# Patient Record
Sex: Male | Born: 1935 | Race: Black or African American | Hispanic: No | Marital: Married | Smoking: Former smoker
Health system: Southern US, Community
[De-identification: ages and names within clinical notes are randomized; demographics above are authoritative.]

## PROBLEM LIST (undated history)

## (undated) DIAGNOSIS — M1990 Unspecified osteoarthritis, unspecified site: Secondary | ICD-10-CM

## (undated) DIAGNOSIS — I10 Essential (primary) hypertension: Secondary | ICD-10-CM

## (undated) DIAGNOSIS — G459 Transient cerebral ischemic attack, unspecified: Secondary | ICD-10-CM

## (undated) DIAGNOSIS — Z86010 Personal history of colonic polyps: Secondary | ICD-10-CM

## (undated) DIAGNOSIS — E611 Iron deficiency: Secondary | ICD-10-CM

## (undated) DIAGNOSIS — E7800 Pure hypercholesterolemia, unspecified: Secondary | ICD-10-CM

## (undated) DIAGNOSIS — I639 Cerebral infarction, unspecified: Secondary | ICD-10-CM

## (undated) DIAGNOSIS — K859 Acute pancreatitis without necrosis or infection, unspecified: Secondary | ICD-10-CM

## (undated) HISTORY — DX: Cerebral infarction, unspecified: I63.9

## (undated) HISTORY — DX: Acute pancreatitis without necrosis or infection, unspecified: K85.90

## (undated) HISTORY — PX: EUS: SHX5427

## (undated) HISTORY — PX: UPPER GASTROINTESTINAL ENDOSCOPY: SHX188

## (undated) HISTORY — DX: Personal history of colonic polyps: Z86.010

## (undated) HISTORY — PX: CHOLECYSTECTOMY: SHX55

## (undated) HISTORY — DX: Essential (primary) hypertension: I10

## (undated) HISTORY — PX: COLONOSCOPY: SHX174

## (undated) HISTORY — DX: Iron deficiency: E61.1

## (undated) HISTORY — DX: Unspecified osteoarthritis, unspecified site: M19.90

## (undated) HISTORY — DX: Pure hypercholesterolemia, unspecified: E78.00

## (undated) HISTORY — DX: Transient cerebral ischemic attack, unspecified: G45.9

---

## 1983-04-10 HISTORY — PX: NECK SURGERY: SHX720

## 1983-04-10 HISTORY — PX: BACK SURGERY: SHX140

## 1997-08-30 ENCOUNTER — Emergency Department (HOSPITAL_COMMUNITY): Admission: EM | Admit: 1997-08-30 | Discharge: 1997-08-30 | Payer: Self-pay | Admitting: Emergency Medicine

## 2009-08-12 IMAGING — CR DG FOOT COMPLETE 3+V*L*
3 series · 3 of 3 positions shown · non-contrast
Comparison: None.

CLINICAL DATA: Trauma.  Bilateral pain.

LEFT FOOT - COMPLETE 3+ VIEW

[t foot lat left *]
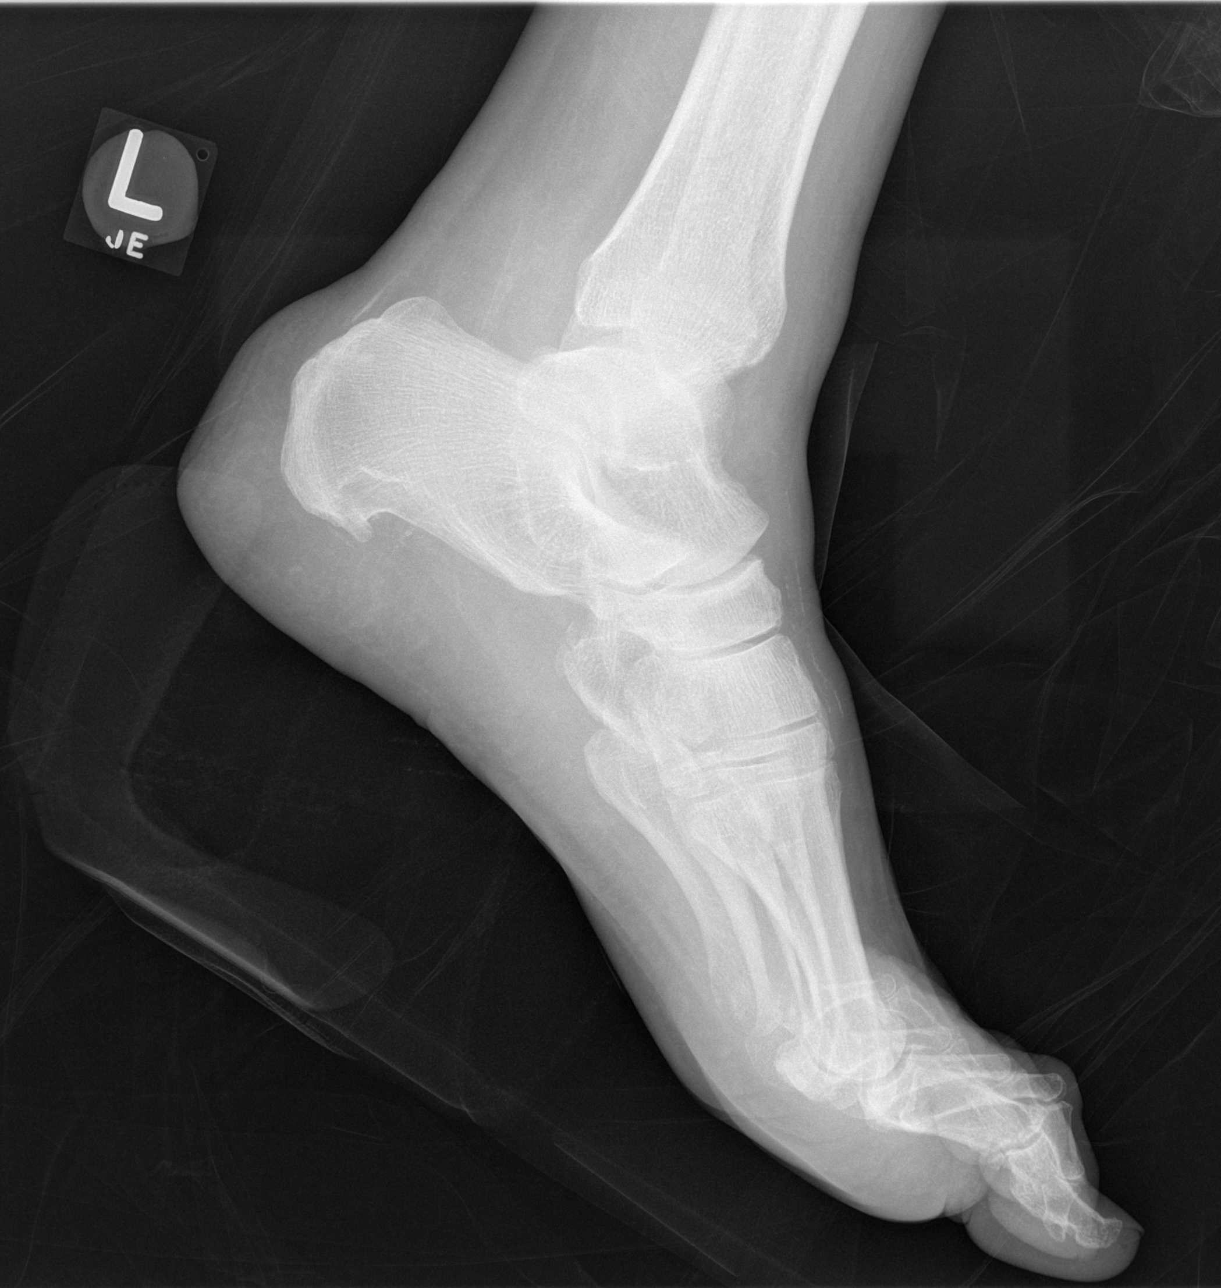

[t foot ap left]
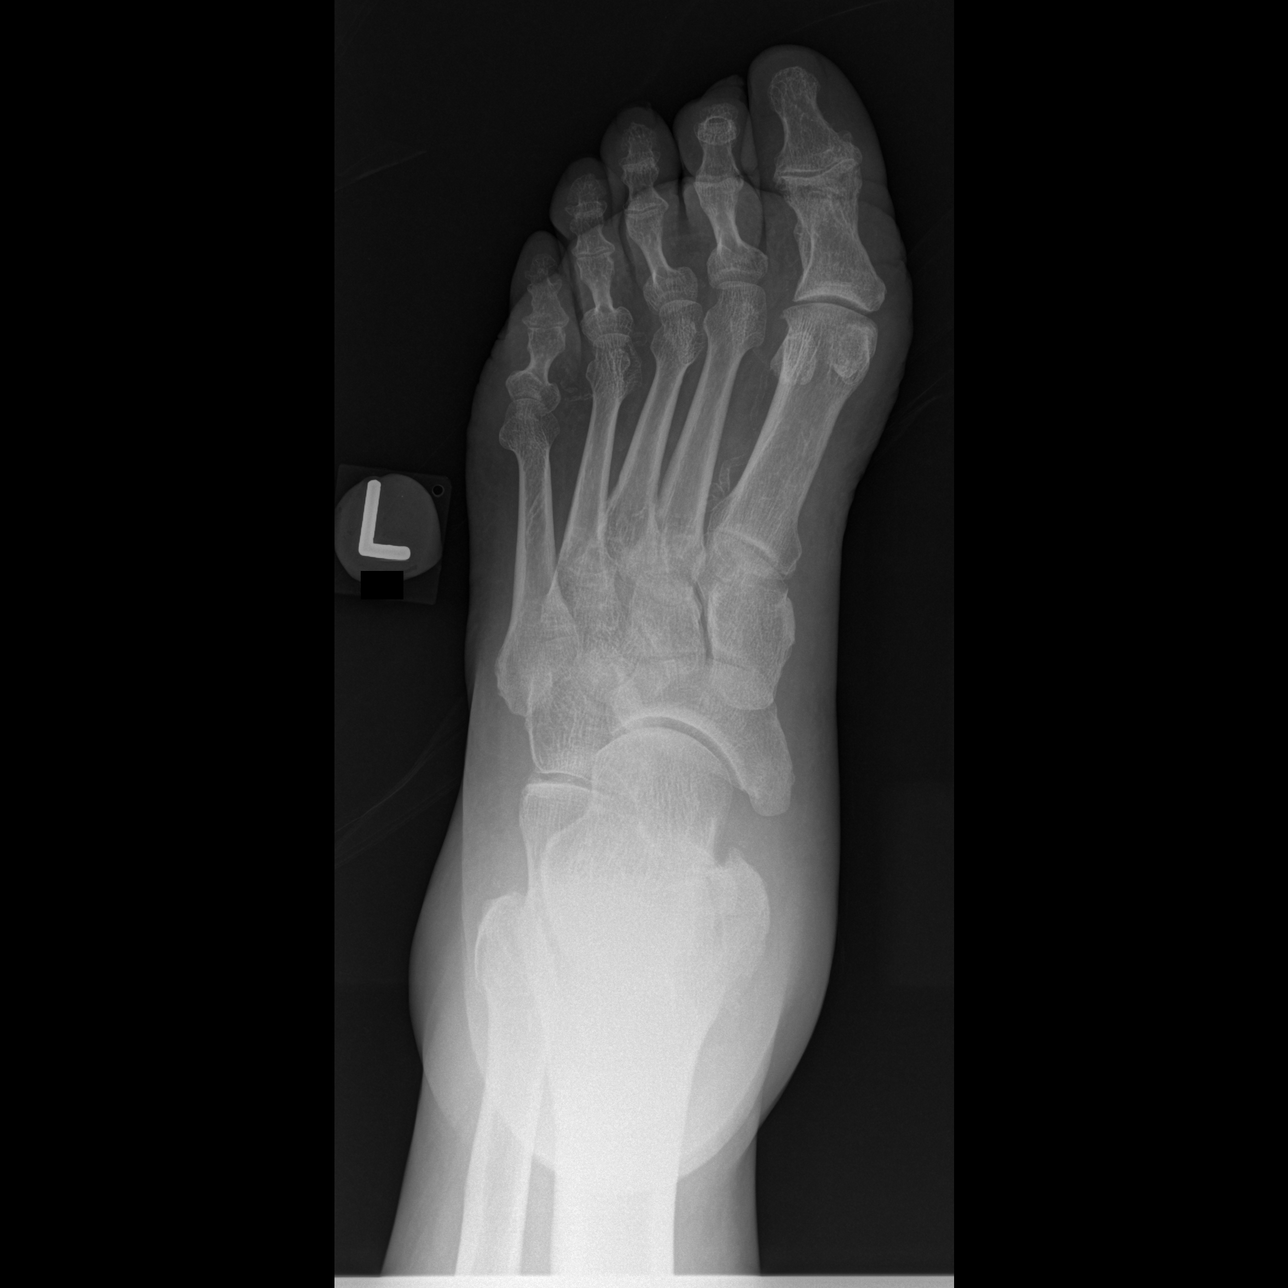

[t foot oblique left]
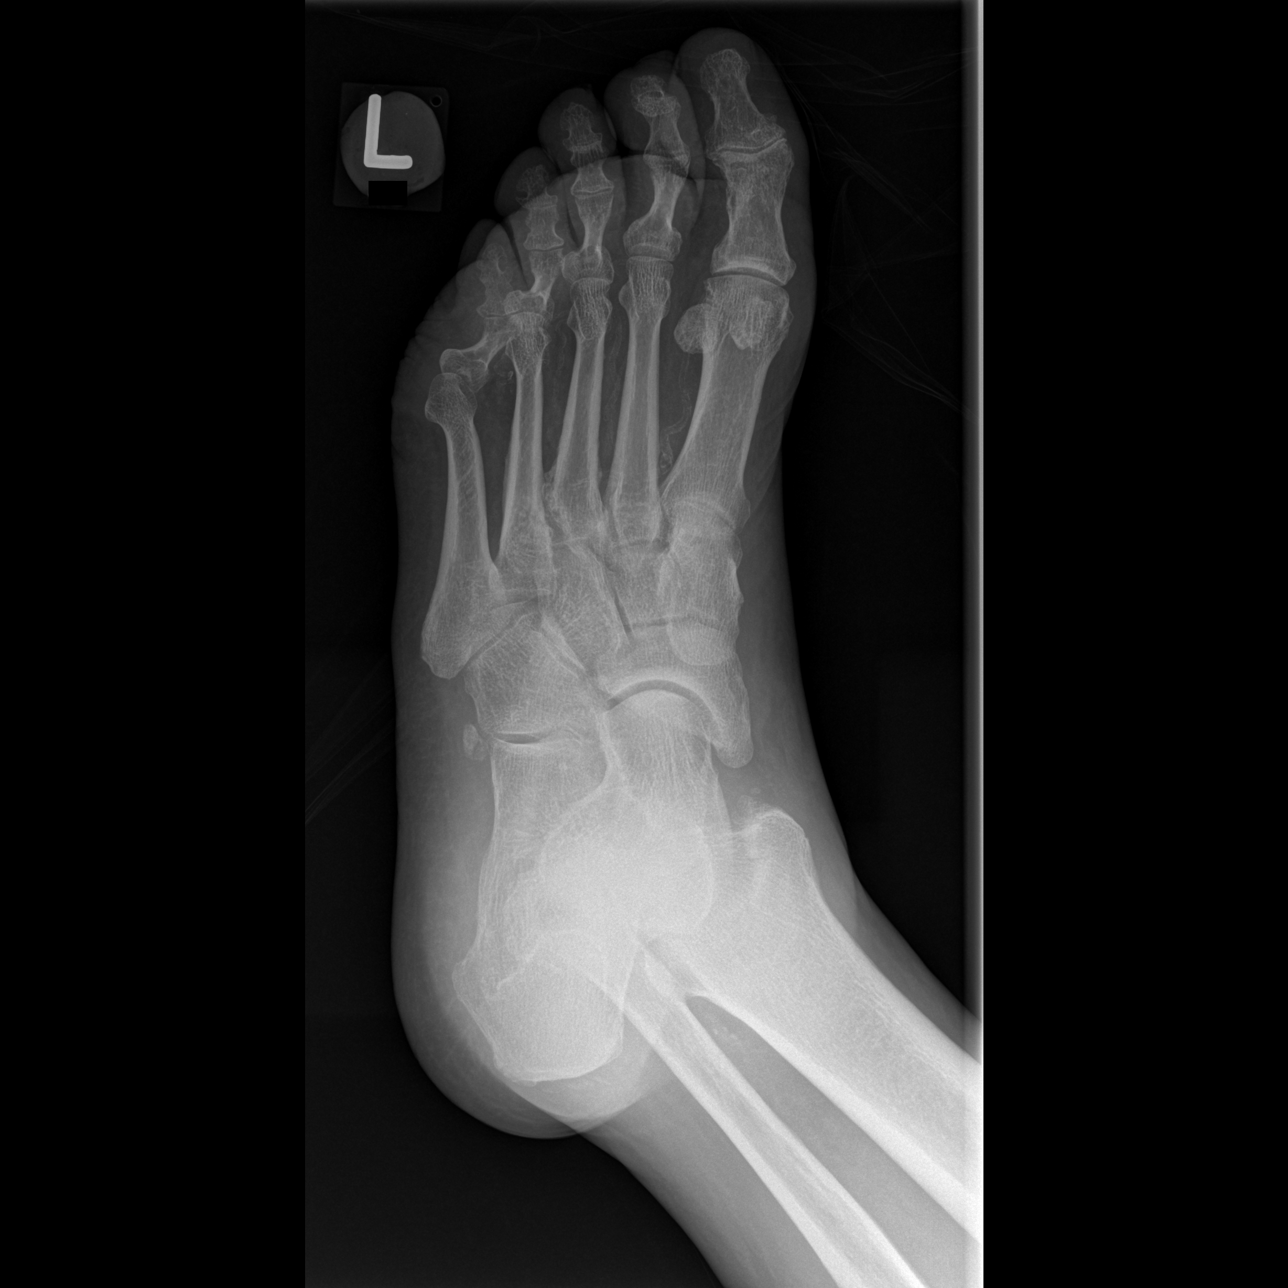

[3 of 3 positions shown; findings below may reference images not displayed]

FINDINGS: The medial malleolar fractures better visualized on
dedicated views of the ankle.  A plantar calcaneal spur is again
noted.  No additional acute bone or soft tissue abnormality is
present.  Small vessel calcifications are noted.
IMPRESSION: 1.  Medial malleolar fracture.
2.  No additional fractures.
3.  Small vessel calcifications compatible with diabetes.

## 2010-04-12 ENCOUNTER — Emergency Department (HOSPITAL_COMMUNITY)
Admission: EM | Admit: 2010-04-12 | Discharge: 2010-04-12 | Payer: Self-pay | Source: Home / Self Care | Admitting: Emergency Medicine

## 2010-04-12 IMAGING — CR DG SHOULDER 2+V*L*
3 series · 3 of 3 positions shown · non-contrast
Comparison: None.

CLINICAL DATA: Trauma

LEFT SHOULDER - 2+ VIEW

[t shoulder ap internal left]
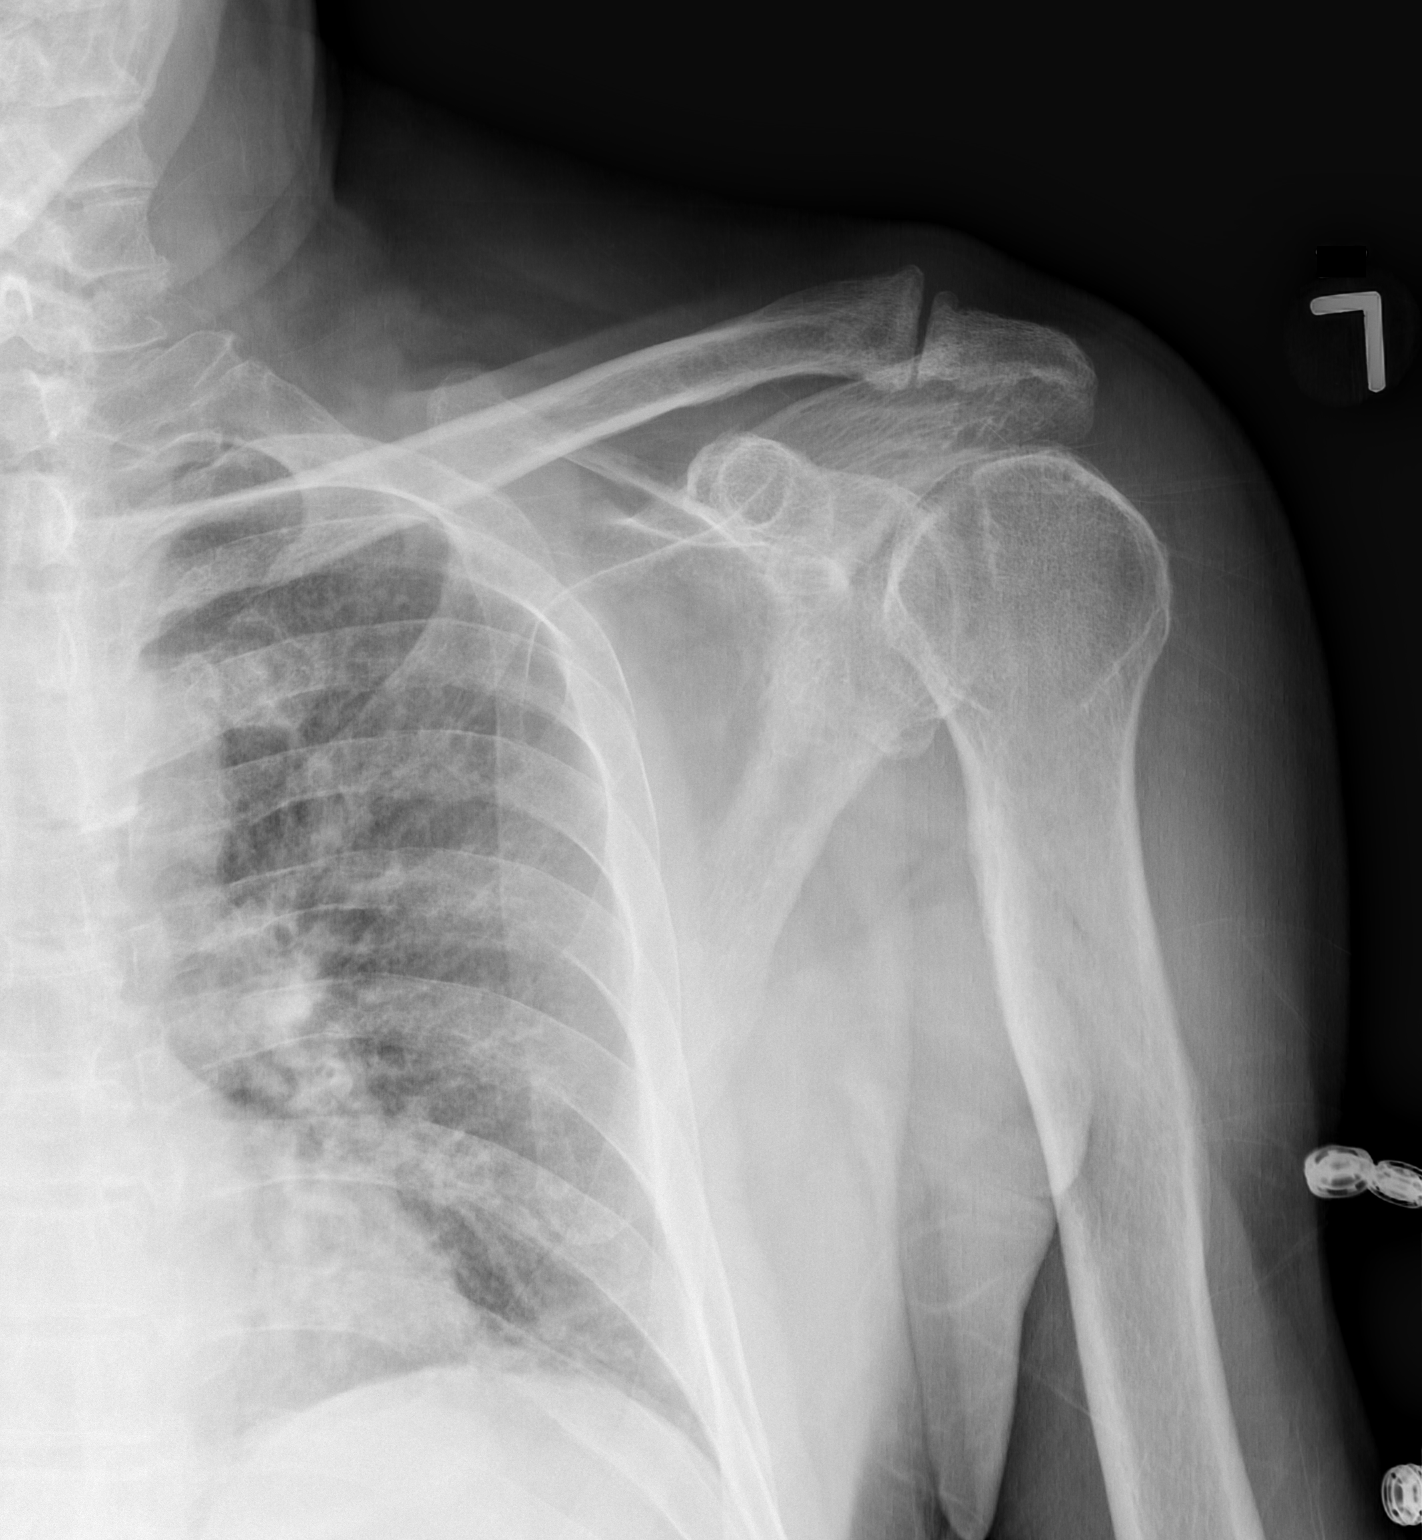

[t shoulder ap external left]
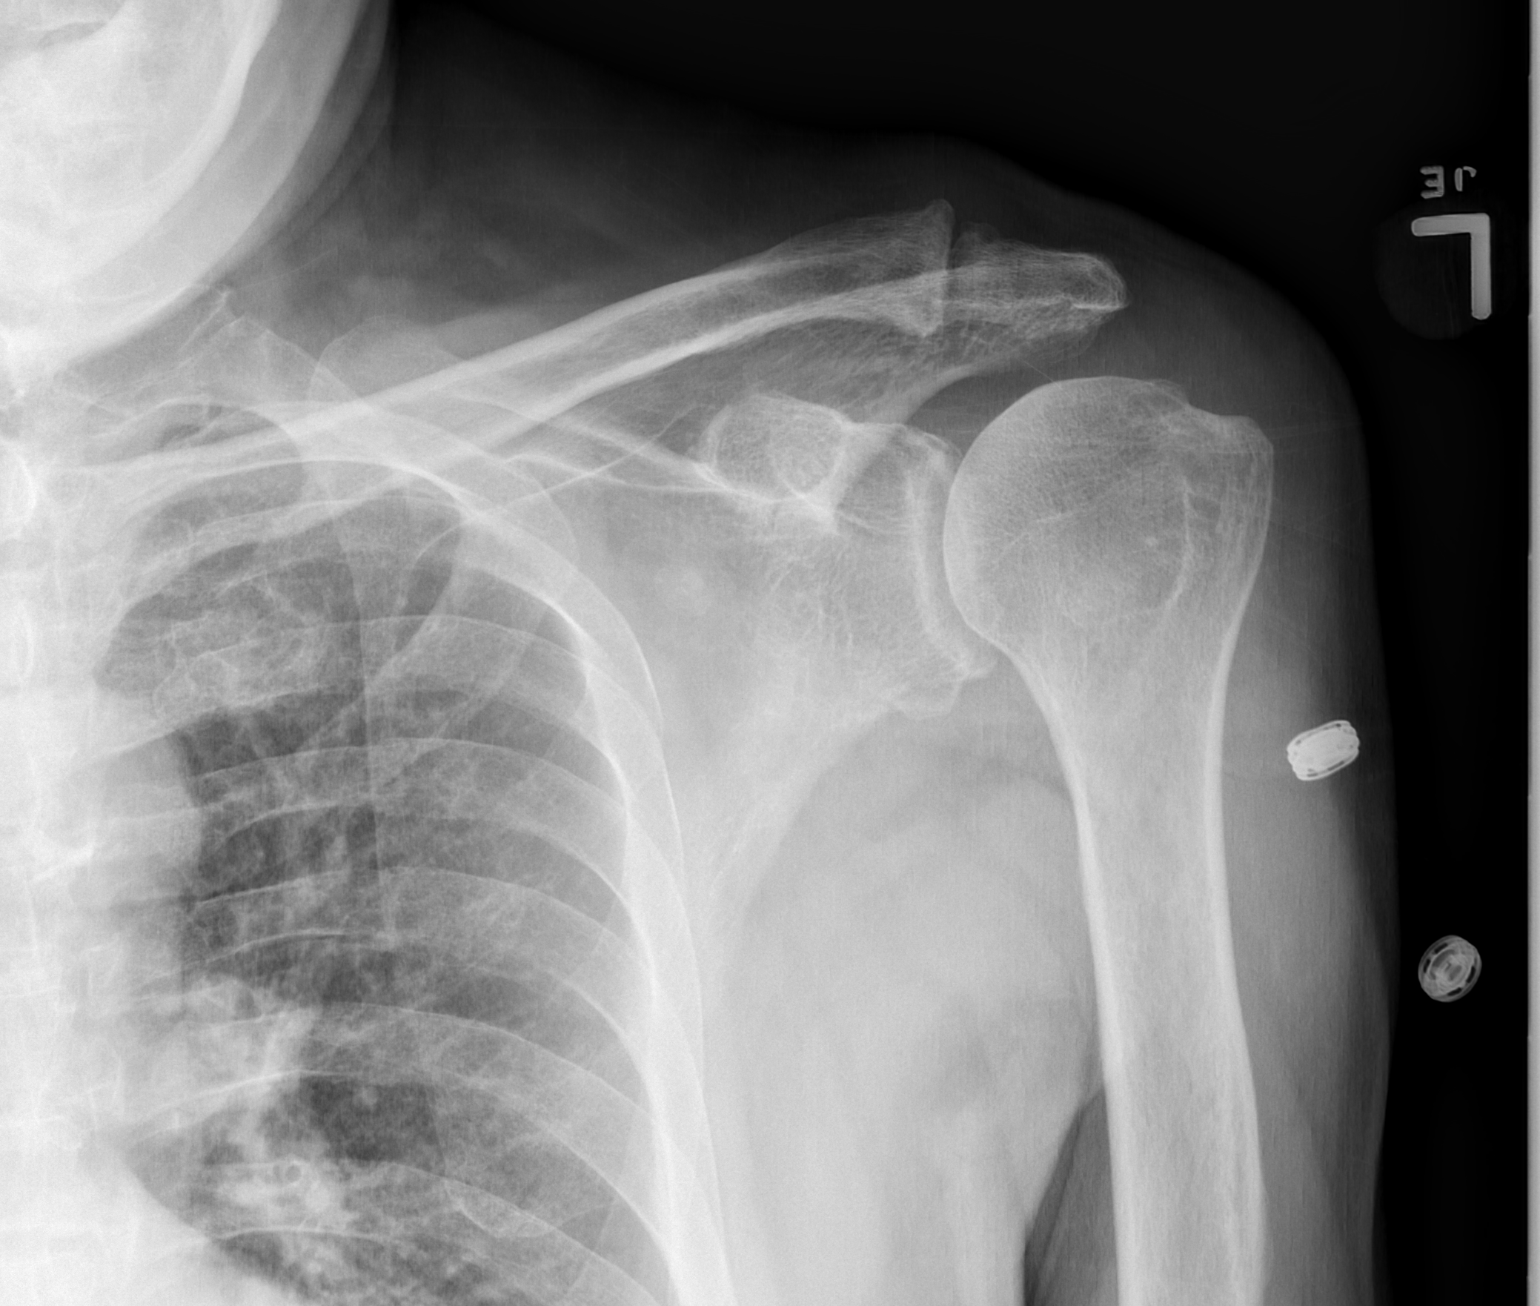

[t shoulder y view left *]
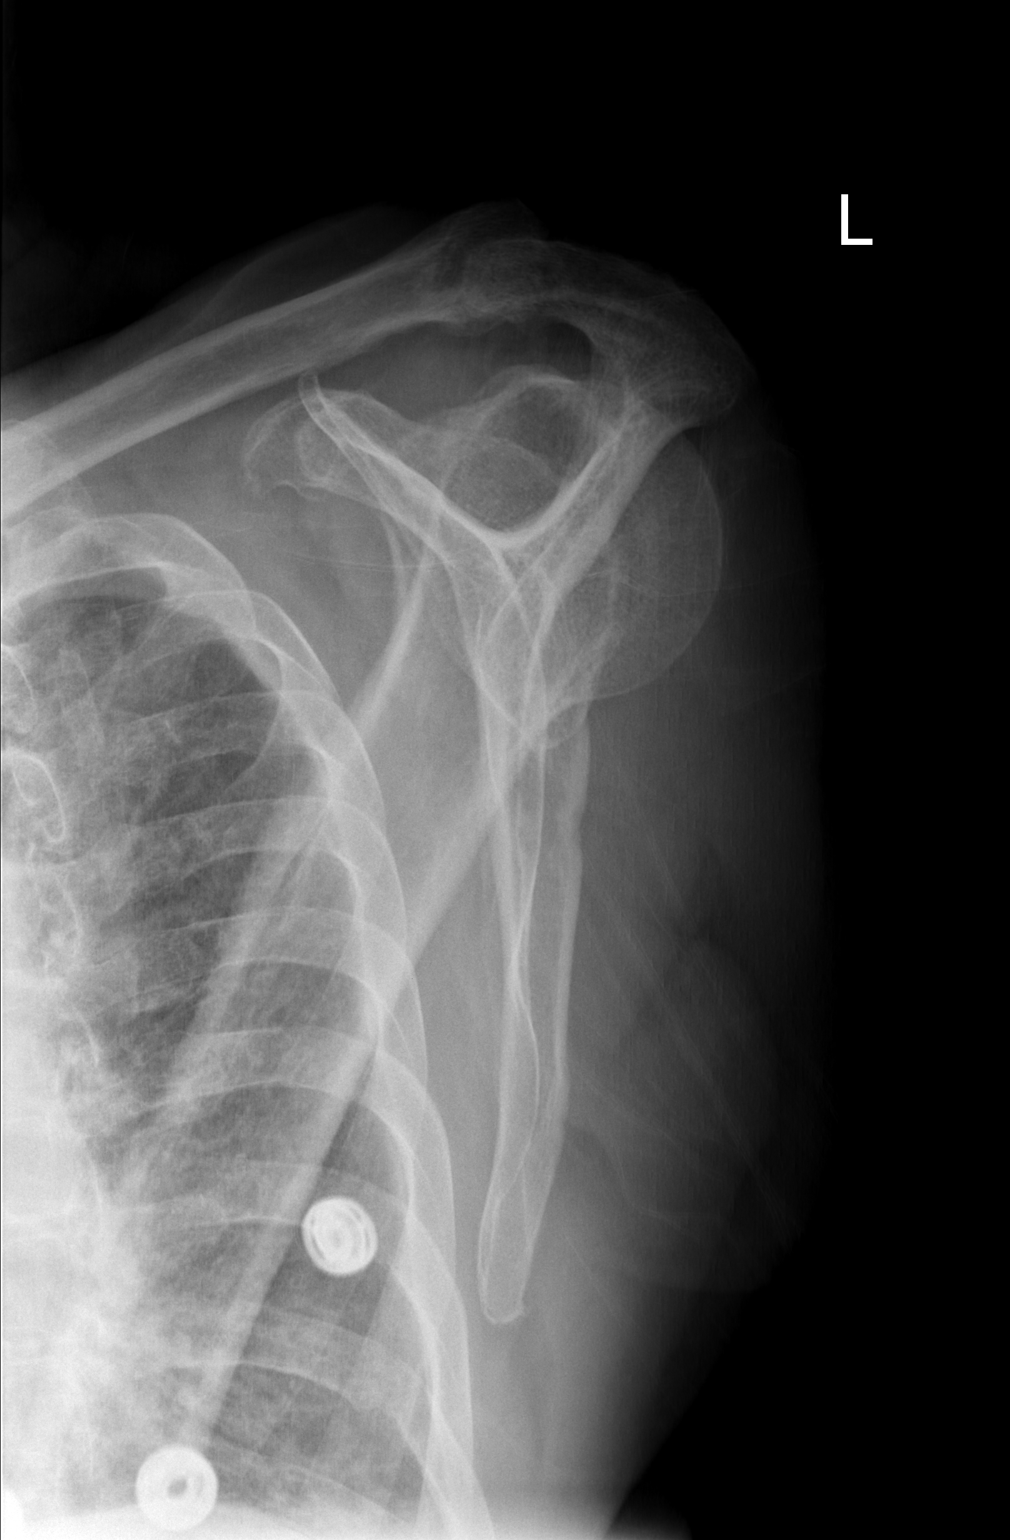

[3 of 3 positions shown; findings below may reference images not displayed]

FINDINGS: Mild left AC joint degenerative change identified.  No
fracture or dislocation.  Left lung apex grossly clear.
IMPRESSION: No fracture or dislocation of the left shoulder.

## 2010-04-12 IMAGING — CR DG KNEE COMPLETE 4+V*R*
4 series · 4 of 4 positions shown · non-contrast
Comparison: None.

CLINICAL DATA: Right knee pain, trauma

RIGHT KNEE - COMPLETE 4+ VIEW

[t knee ap right]
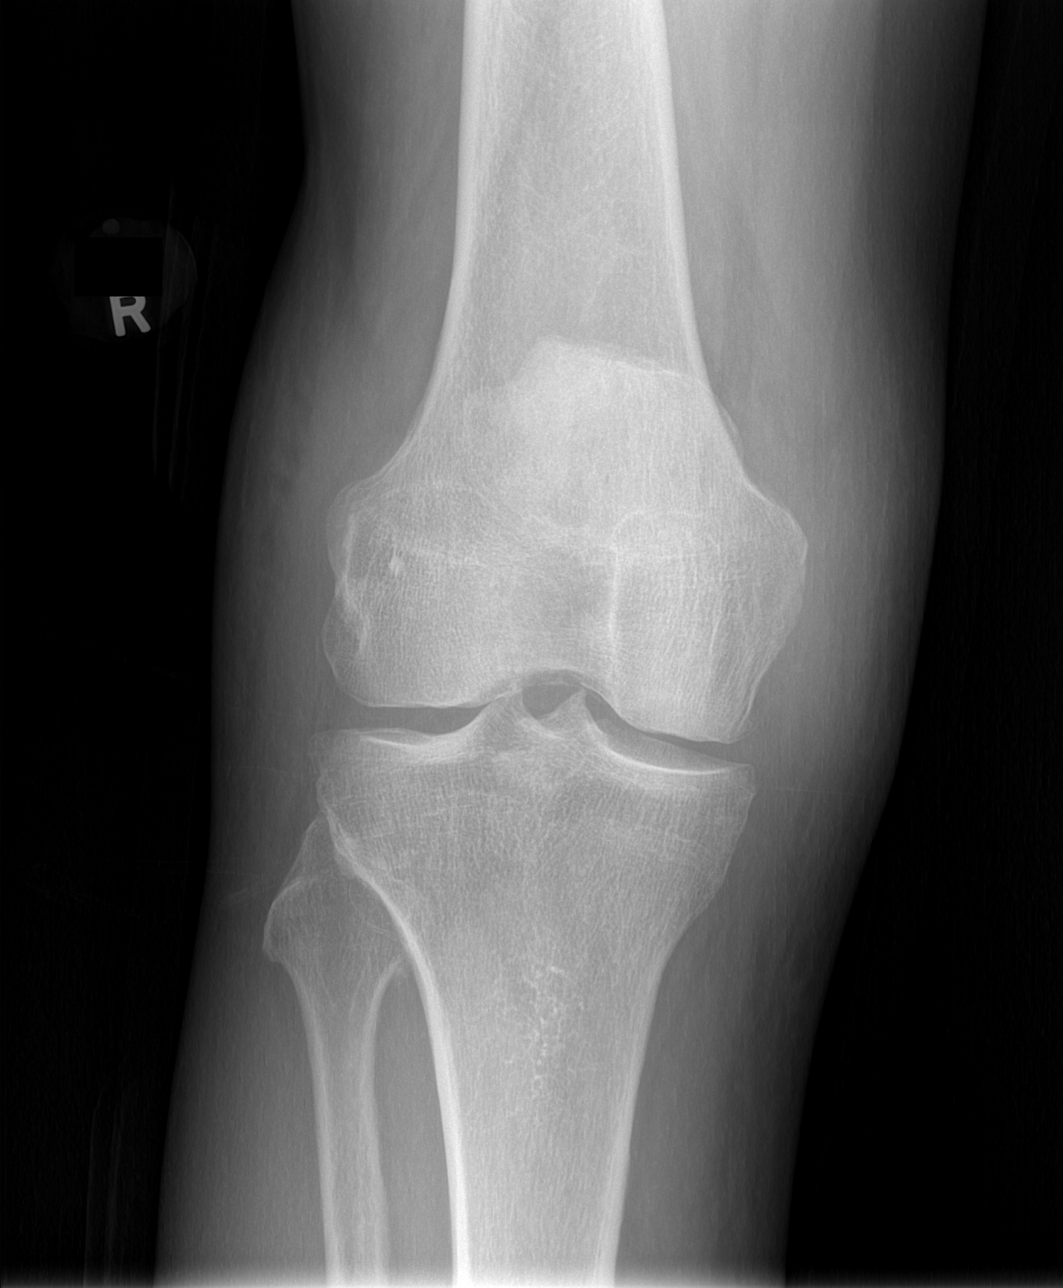

[t knee oblique right (1 of 2)]
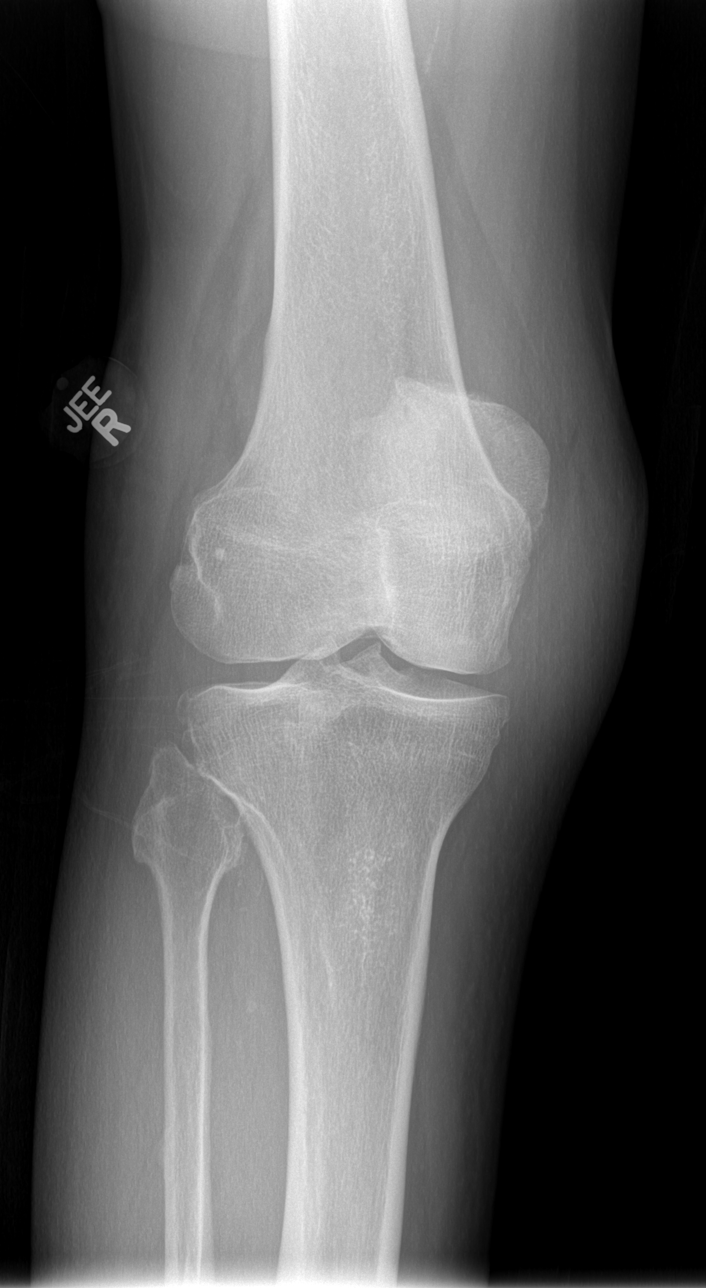

[t knee oblique right (2 of 2)]
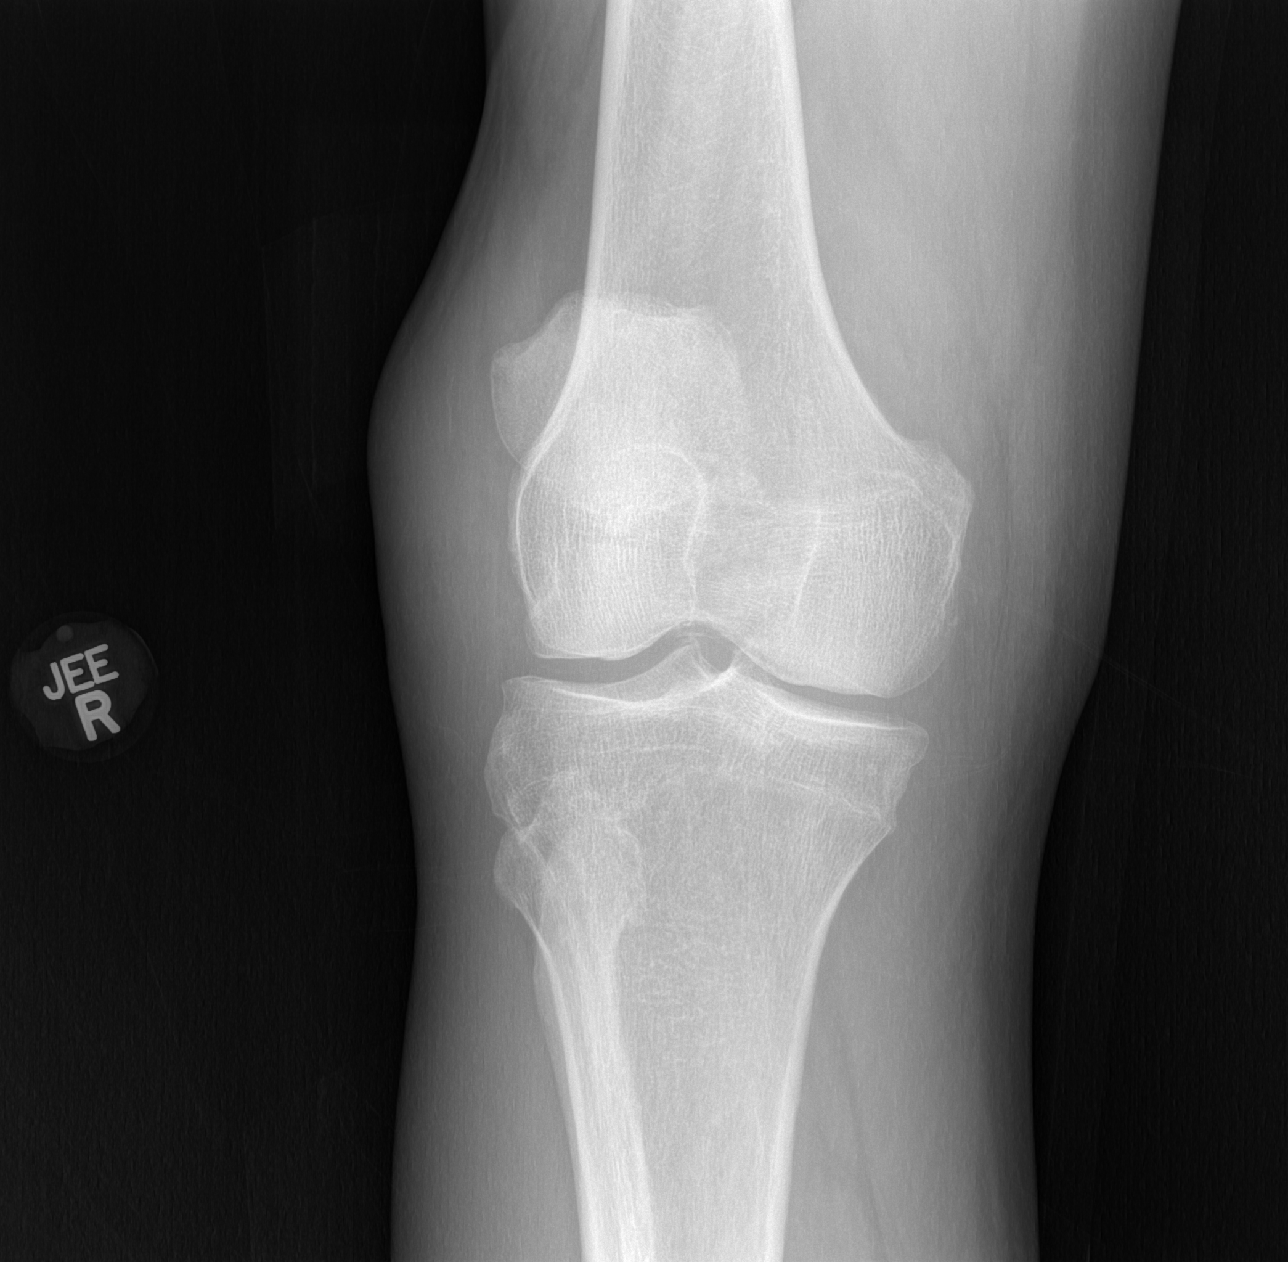

[t knee lat right]
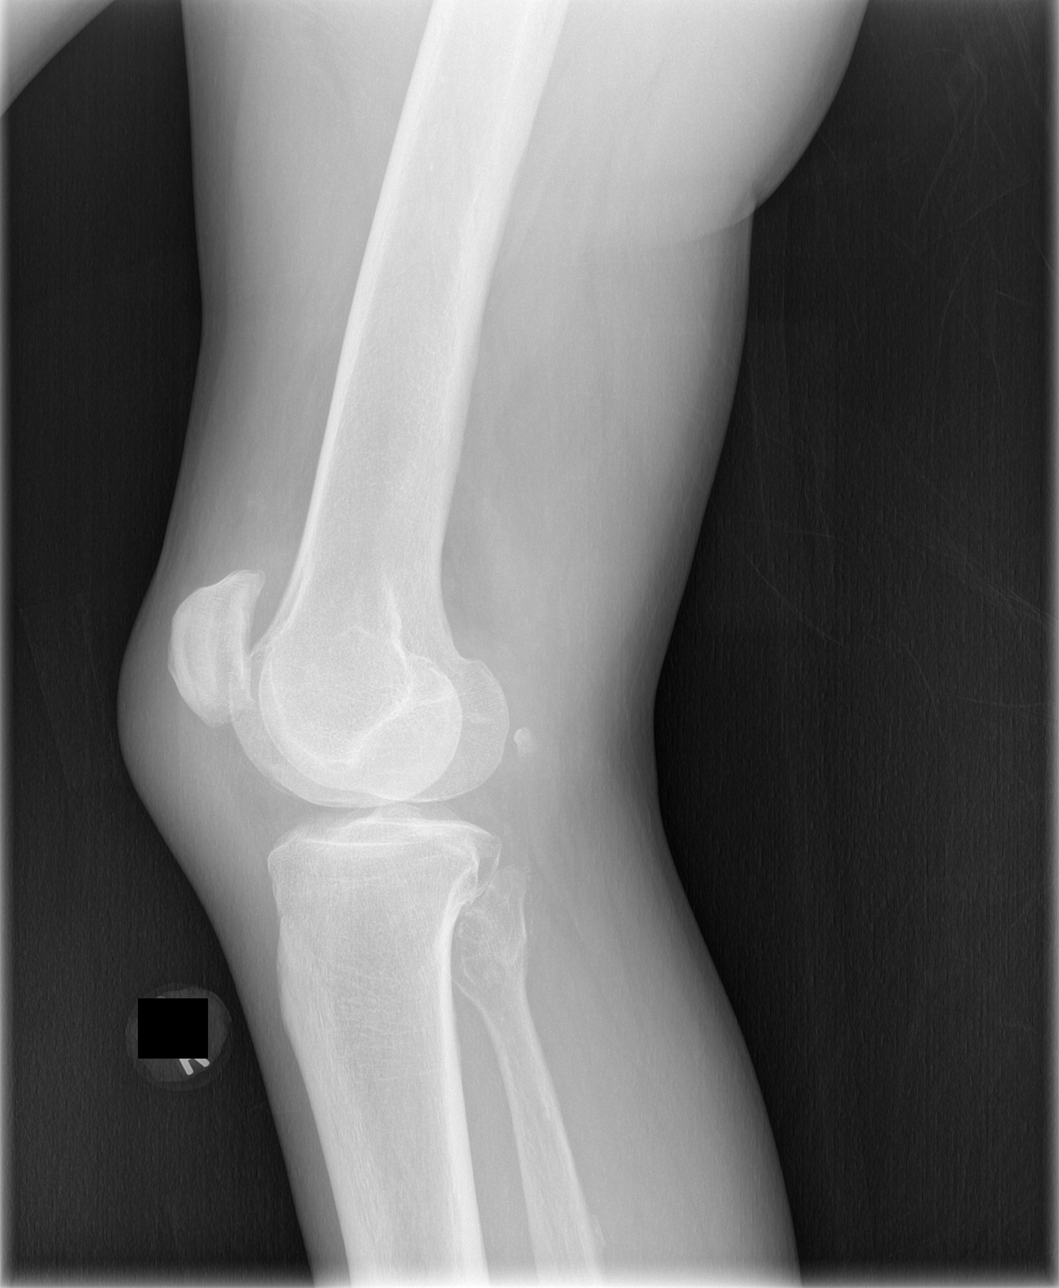

[4 of 4 positions shown; findings below may reference images not displayed]

FINDINGS: Mild right tibial spine spurring is noted.  No fracture
is identified.  Minimal pre patellar soft tissue swelling is noted.
No suprapatellar effusion.
IMPRESSION: No fracture dislocation.  Prepatellar soft tissue swelling.

## 2010-04-12 IMAGING — CR DG TIBIA/FIBULA 2V*L*
4 series · 4 of 4 positions shown · non-contrast
Comparison: None.

CLINICAL DATA: Trauma, left ankle pain

LEFT TIBIA AND FIBULA - 2 VIEW

[t tib/fib ap left * (1 of 2)]
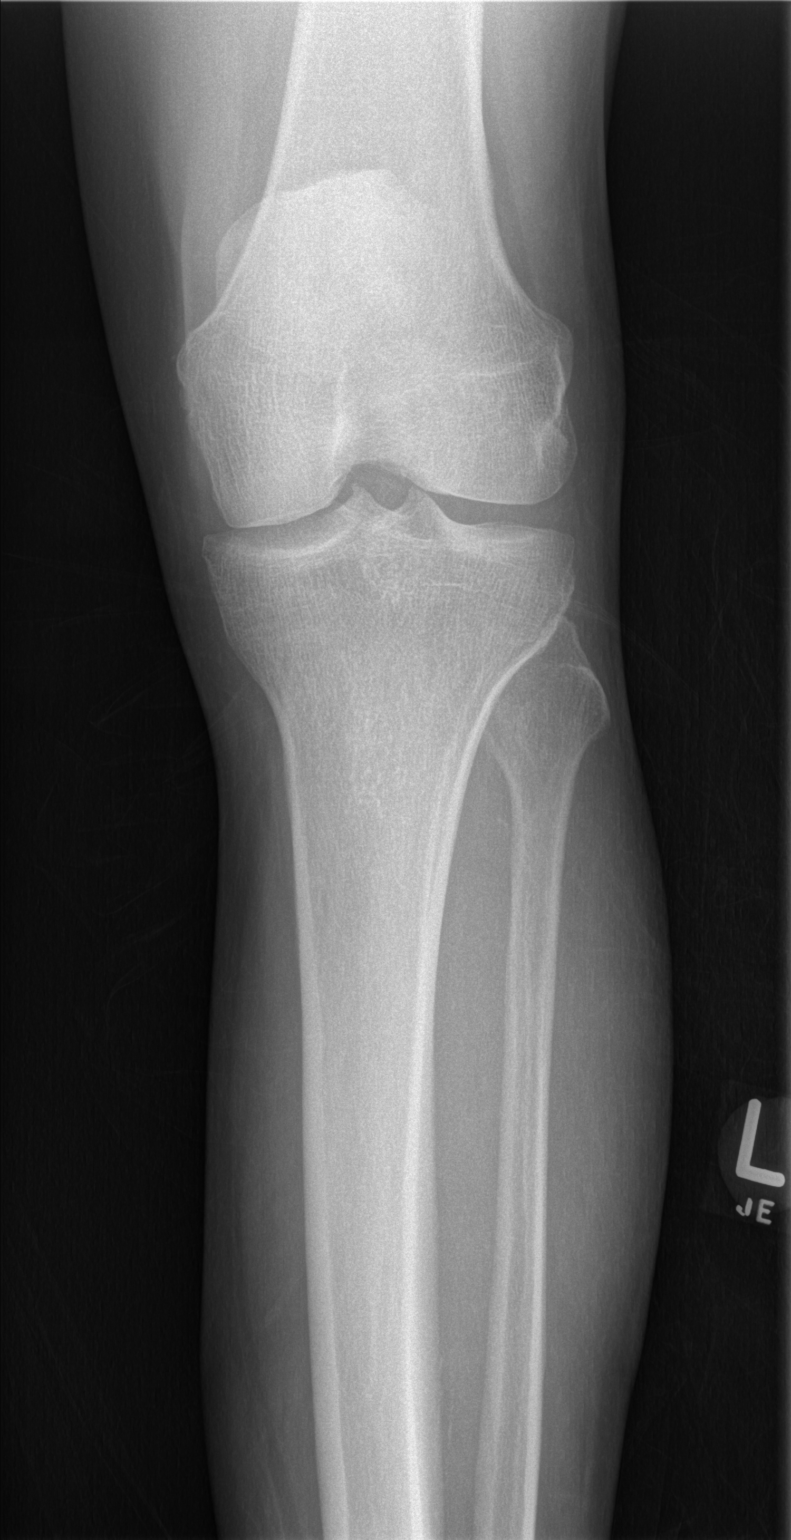

[t tib/fib ap left * (2 of 2)]
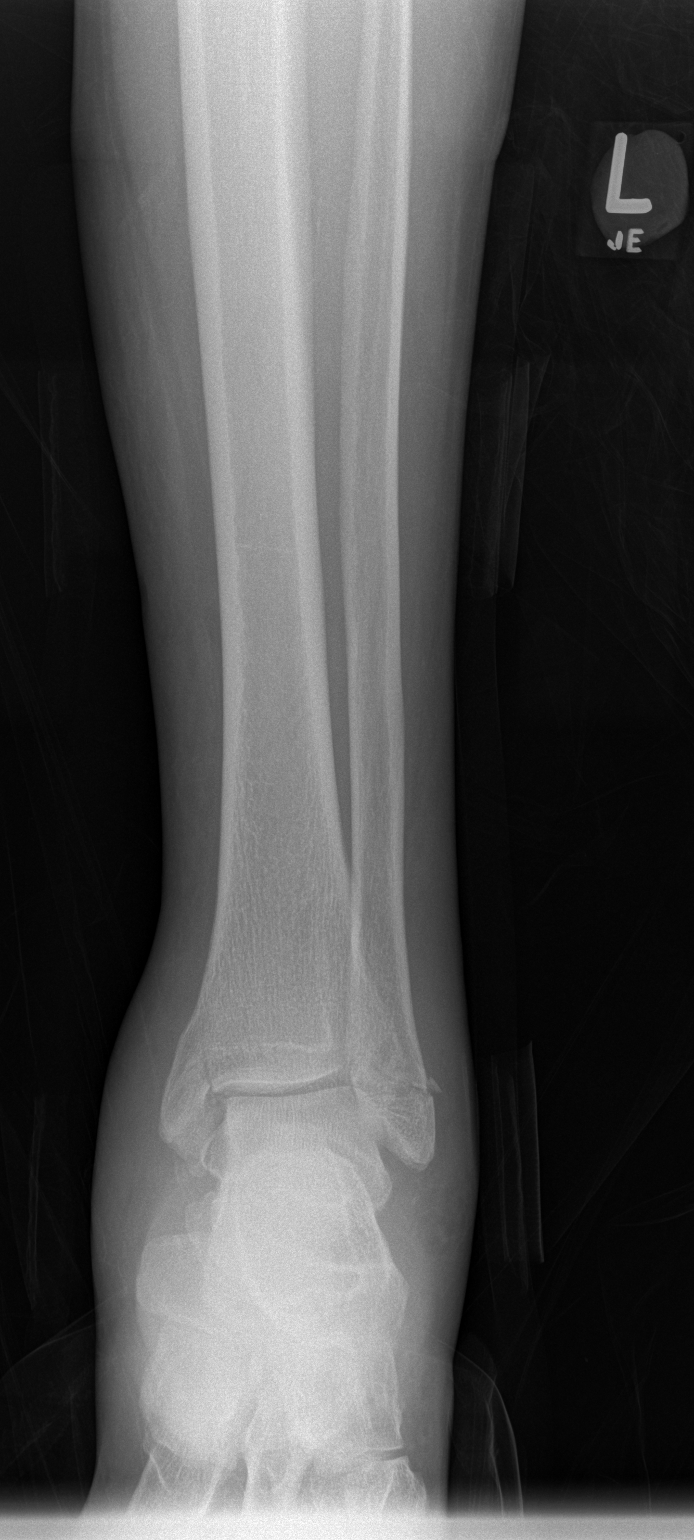

[t tib/fib lat left (1 of 2)]
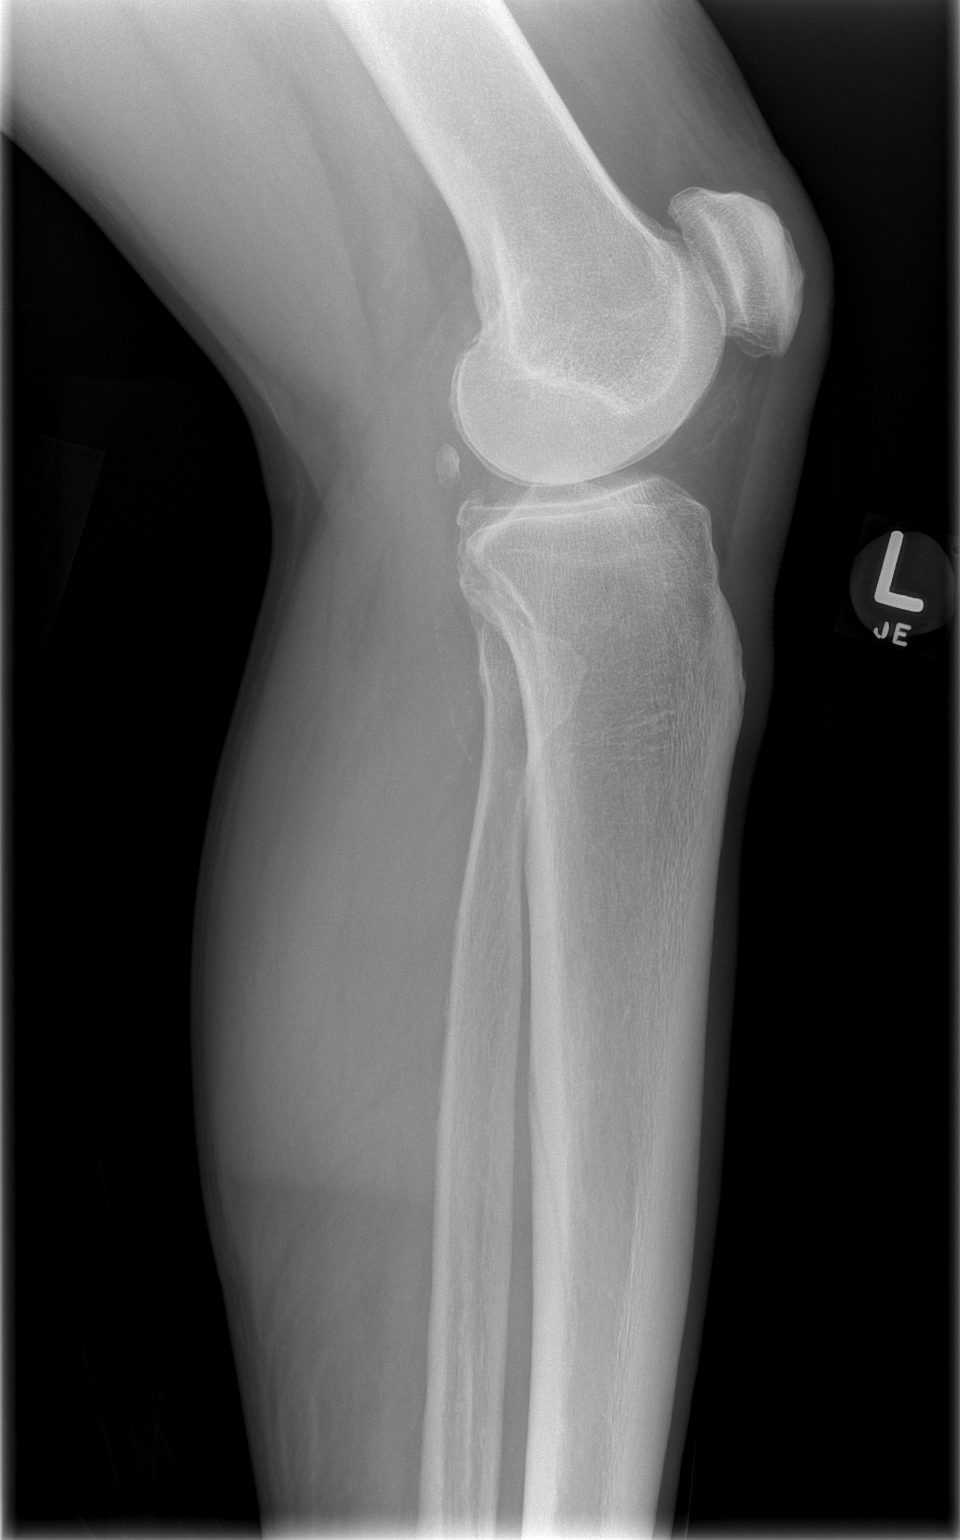

[t tib/fib lat left (2 of 2)]
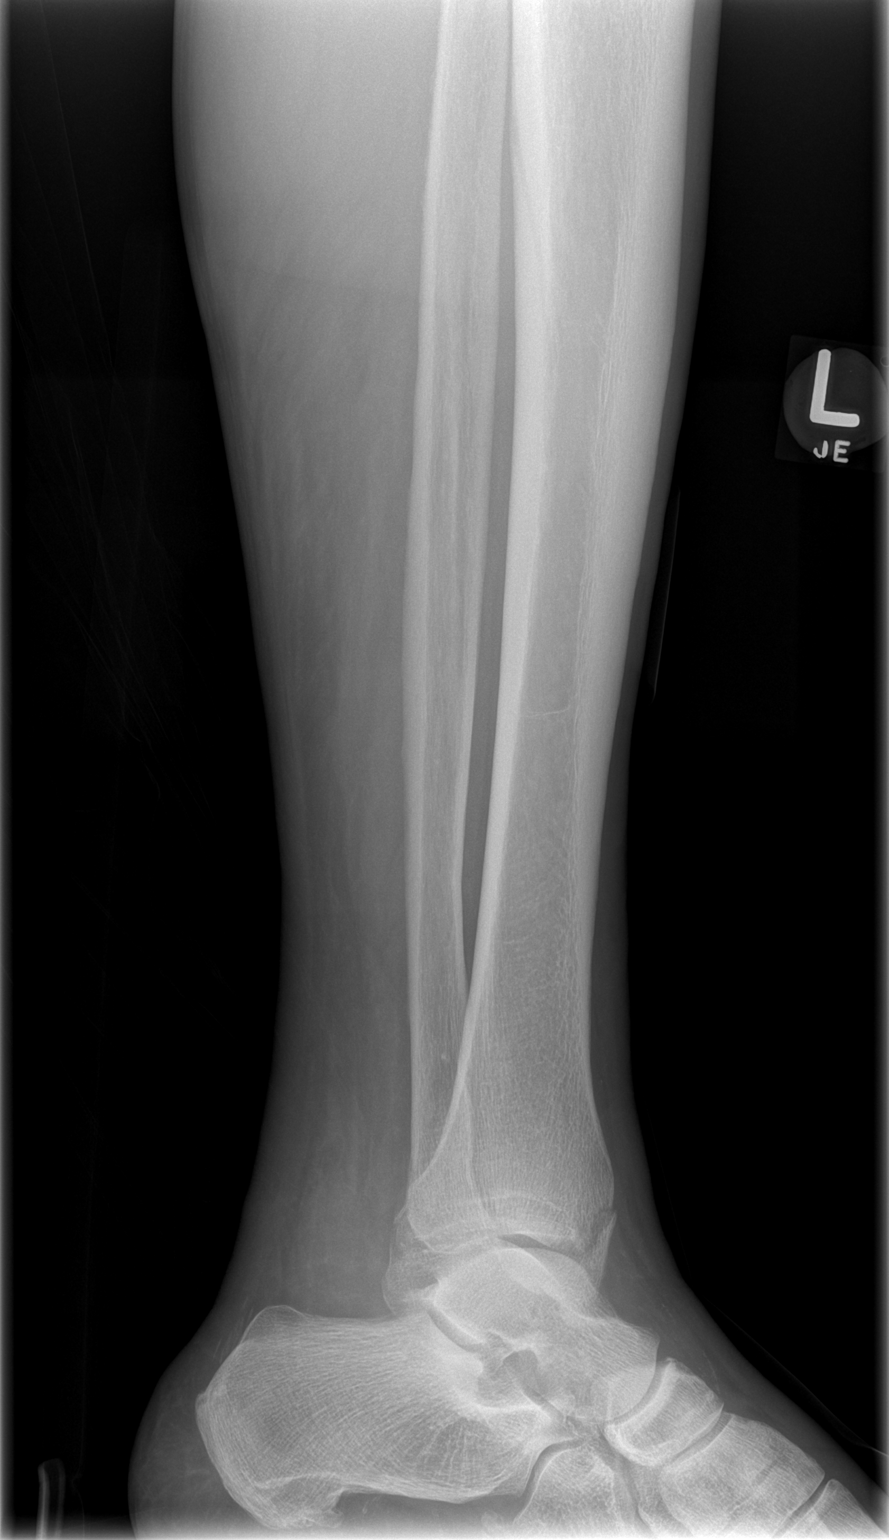

[4 of 4 positions shown; findings below may reference images not displayed]

FINDINGS: Oblique medial malleolus fracture and transverse lateral
malleolar fracture noted with overlying soft tissue swelling.  No
radiopaque foreign body.  The tibia and fibula are otherwise
unremarkable.
IMPRESSION: Bilateral malleolar fractures as above.

## 2010-04-12 IMAGING — CR DG TIBIA/FIBULA 2V*R*
4 series · 4 of 4 positions shown · non-contrast
Comparison: Right ankle films.

CLINICAL DATA: Bilateral lower extremity trauma.  Pain.

RIGHT TIBIA AND FIBULA - 2 VIEW

[t tib/fib ap right (1 of 2)]
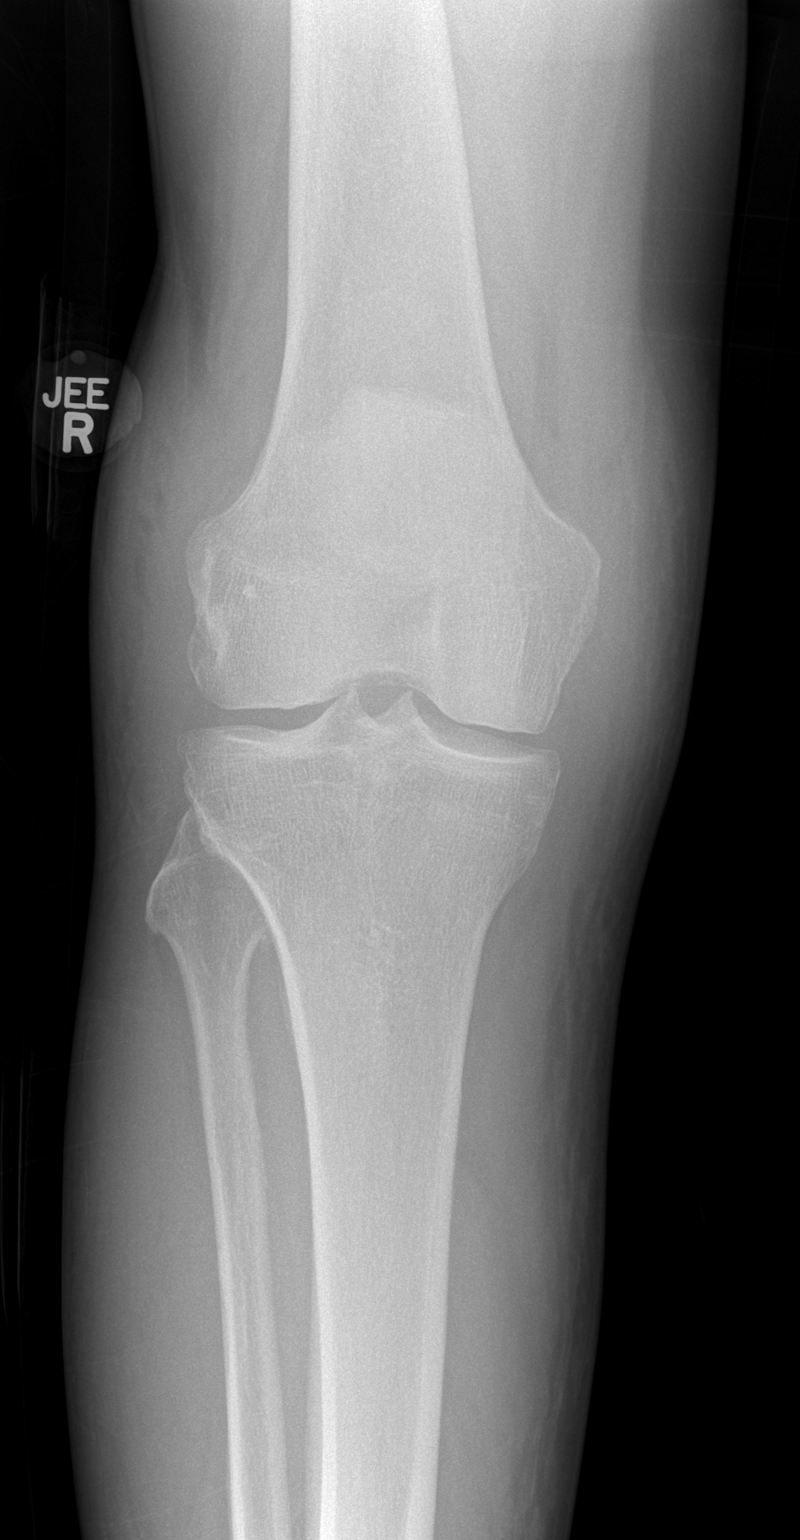

[t tib/fib ap right (2 of 2)]
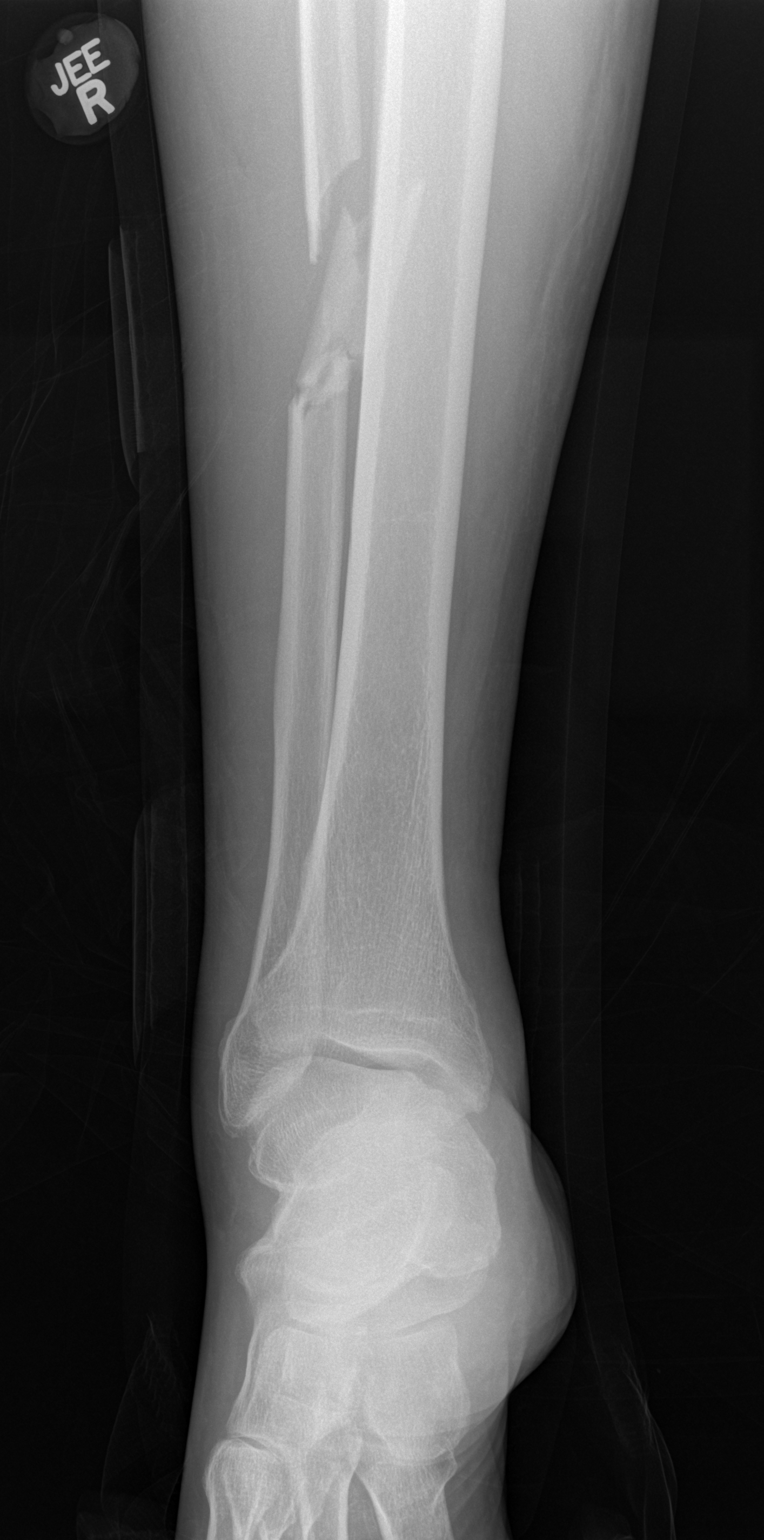

[t tib/fib lat right * (1 of 2)]
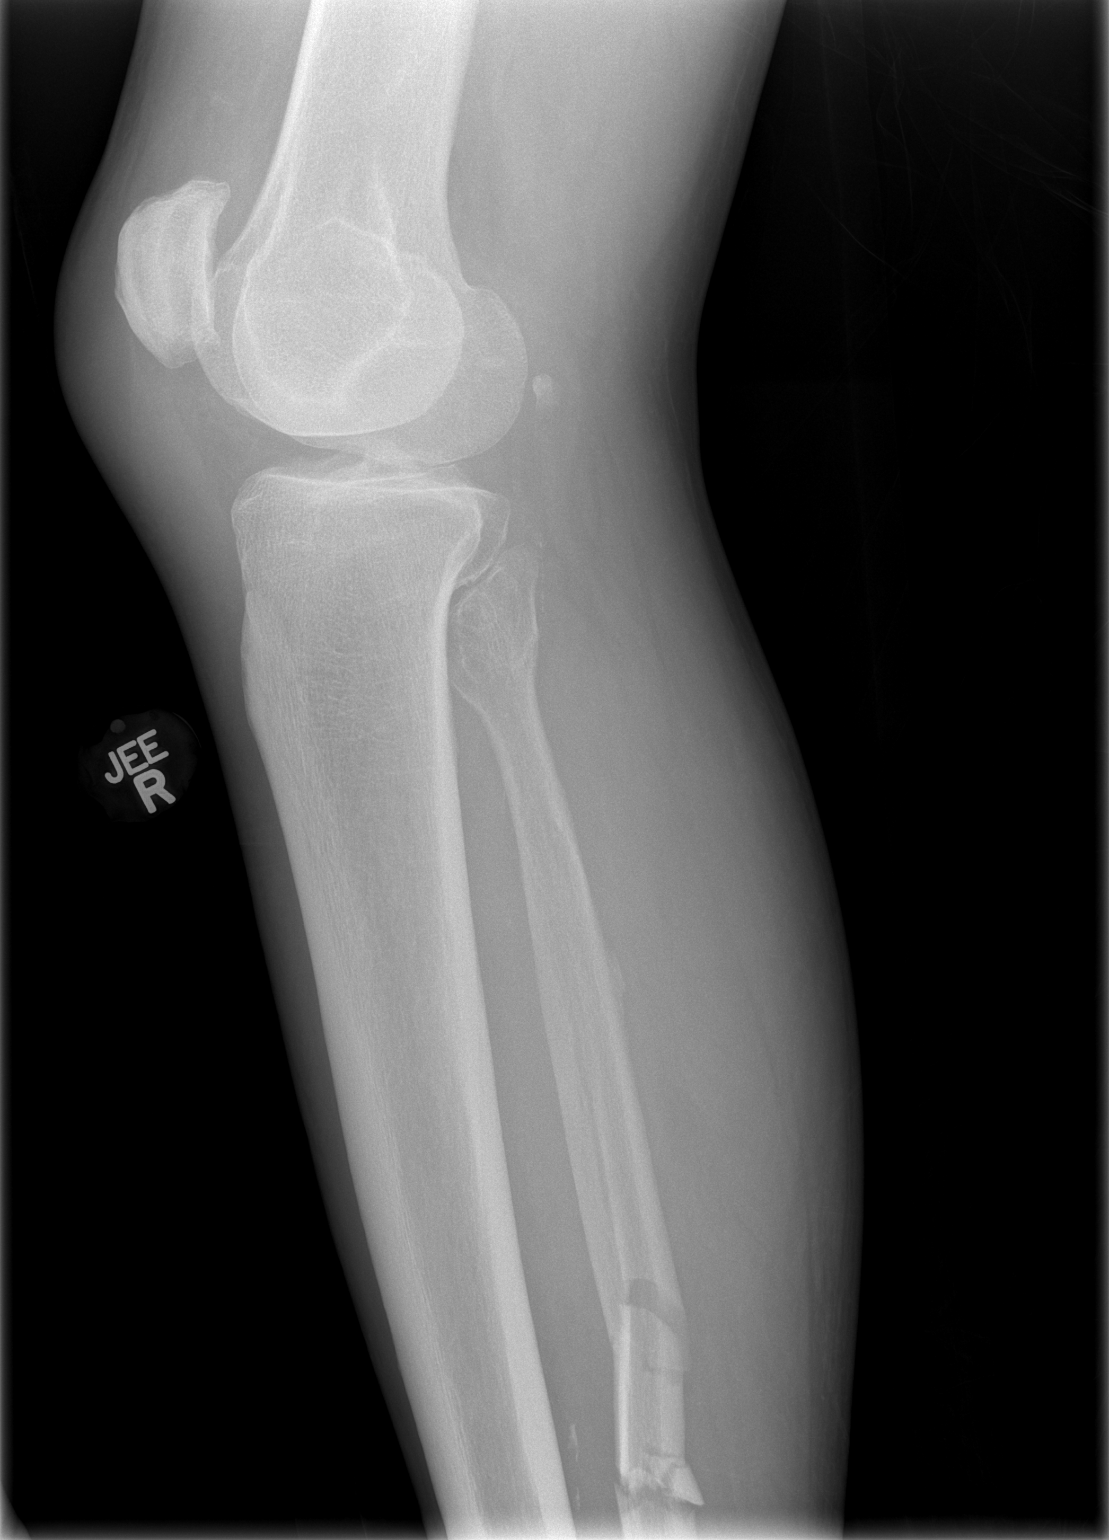

[t tib/fib lat right * (2 of 2)]
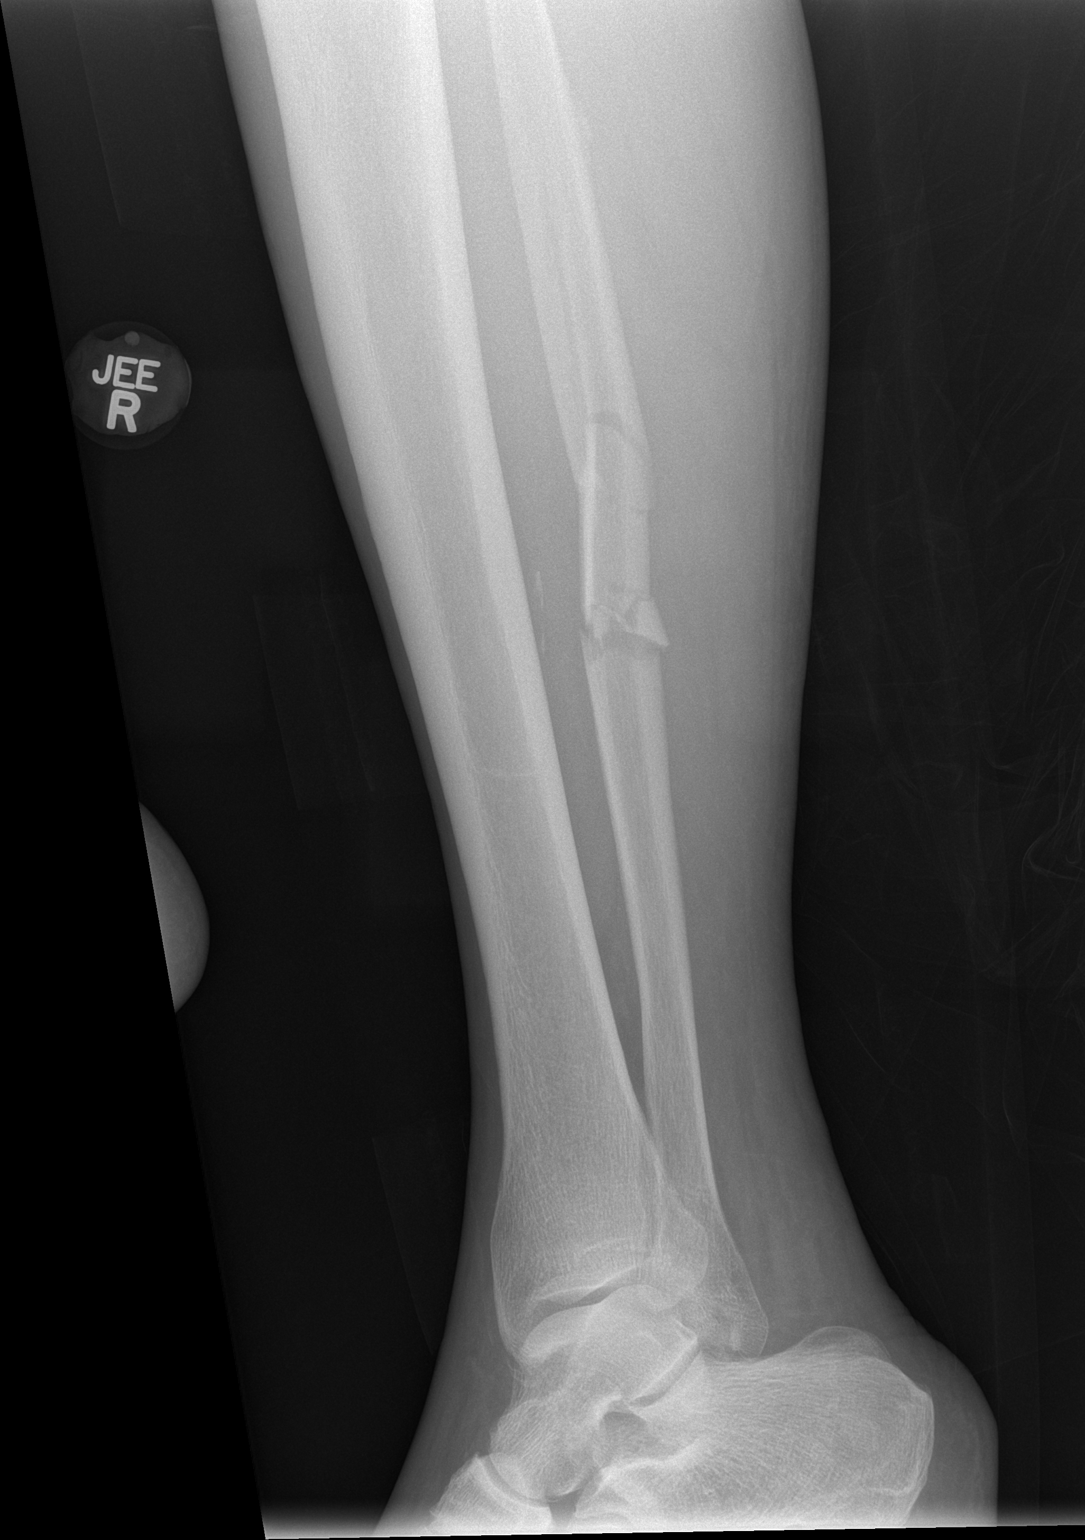

[4 of 4 positions shown; findings below may reference images not displayed]

FINDINGS: A comminuted mid shaft fibular fracture is displaced
approximately one shaft width.  The more proximal tibia and fibula
are intact.  Prepatellar soft tissue swelling is noted at the knee.
There is some dorsal angulation of the fibular fracture.  A medial
malleolar fracture is again noted.
IMPRESSION: 1.  Comminuted mid shaft fibular fracture.
2.  Prepatellar soft tissue swelling without focal pathology at the
knee.
3.  Fracture of the medial malleolus.

## 2010-04-12 IMAGING — CR DG ANKLE COMPLETE 3+V*L*
3 series · 3 of 3 positions shown · non-contrast
Comparison: None.

CLINICAL DATA: The trauma.  Bilateral lower leg trauma.

LEFT ANKLE COMPLETE - 3+ VIEW

[t ankle joint ap left]
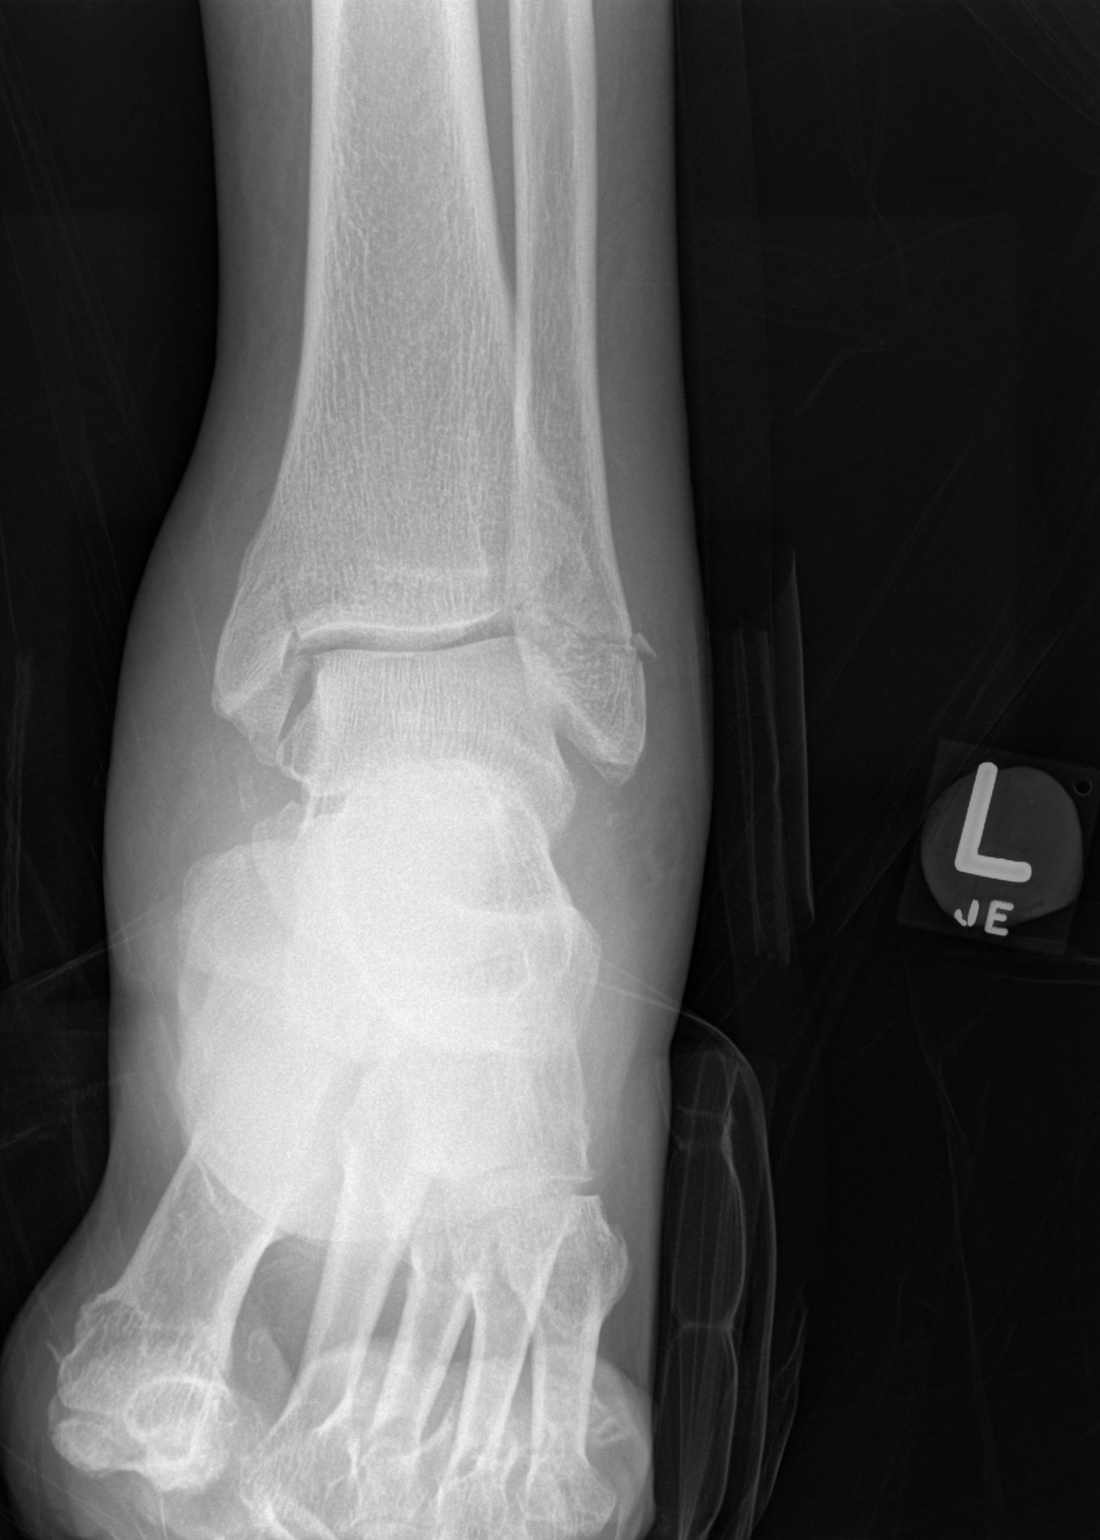

[t ankle joint oblique left]
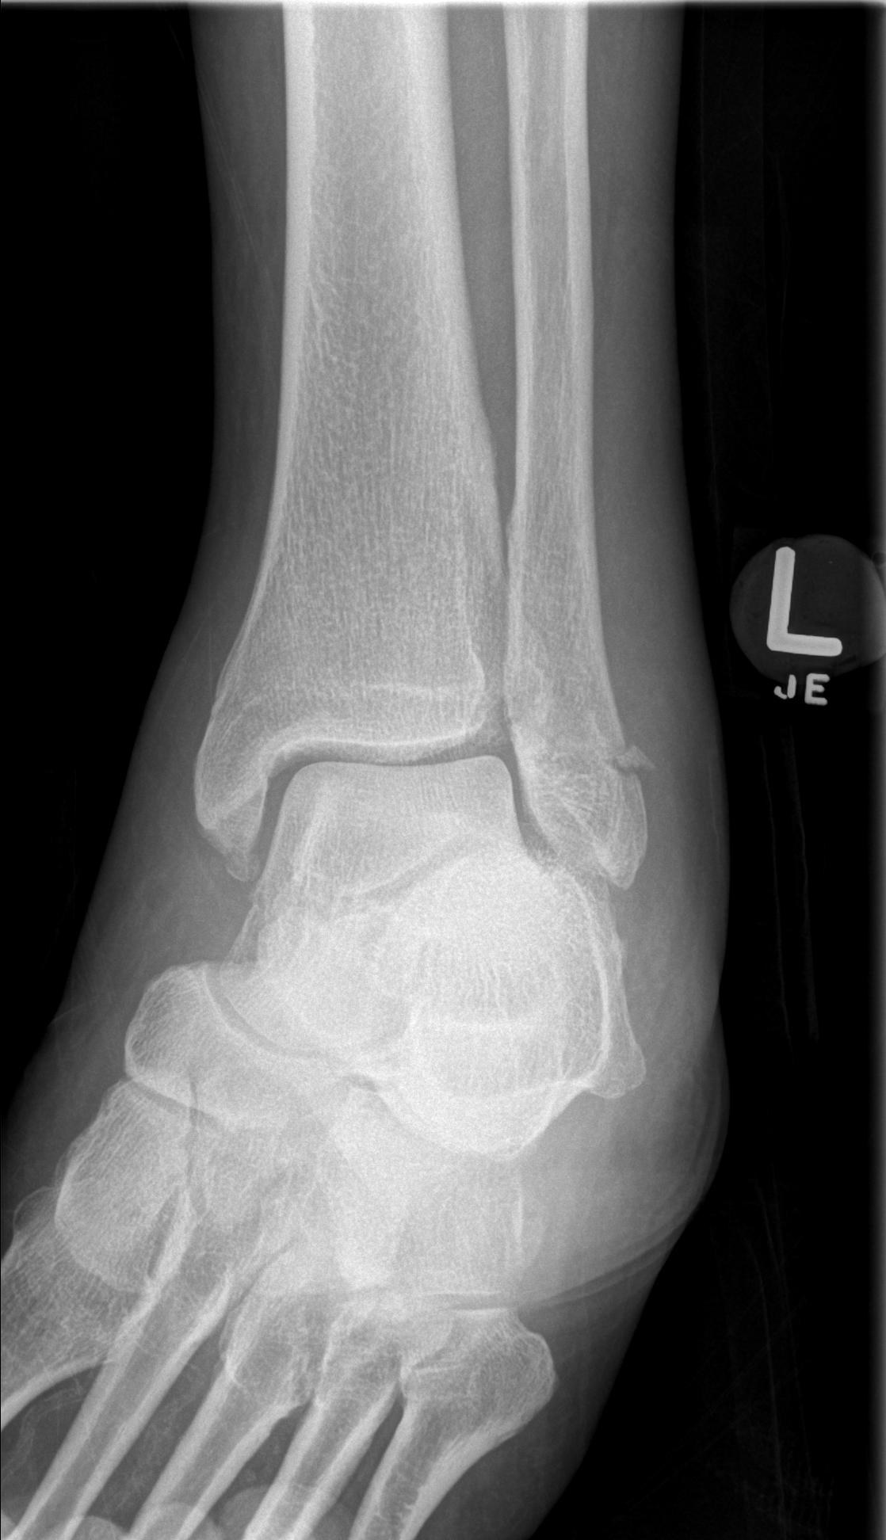

[t ankle joint lat left]
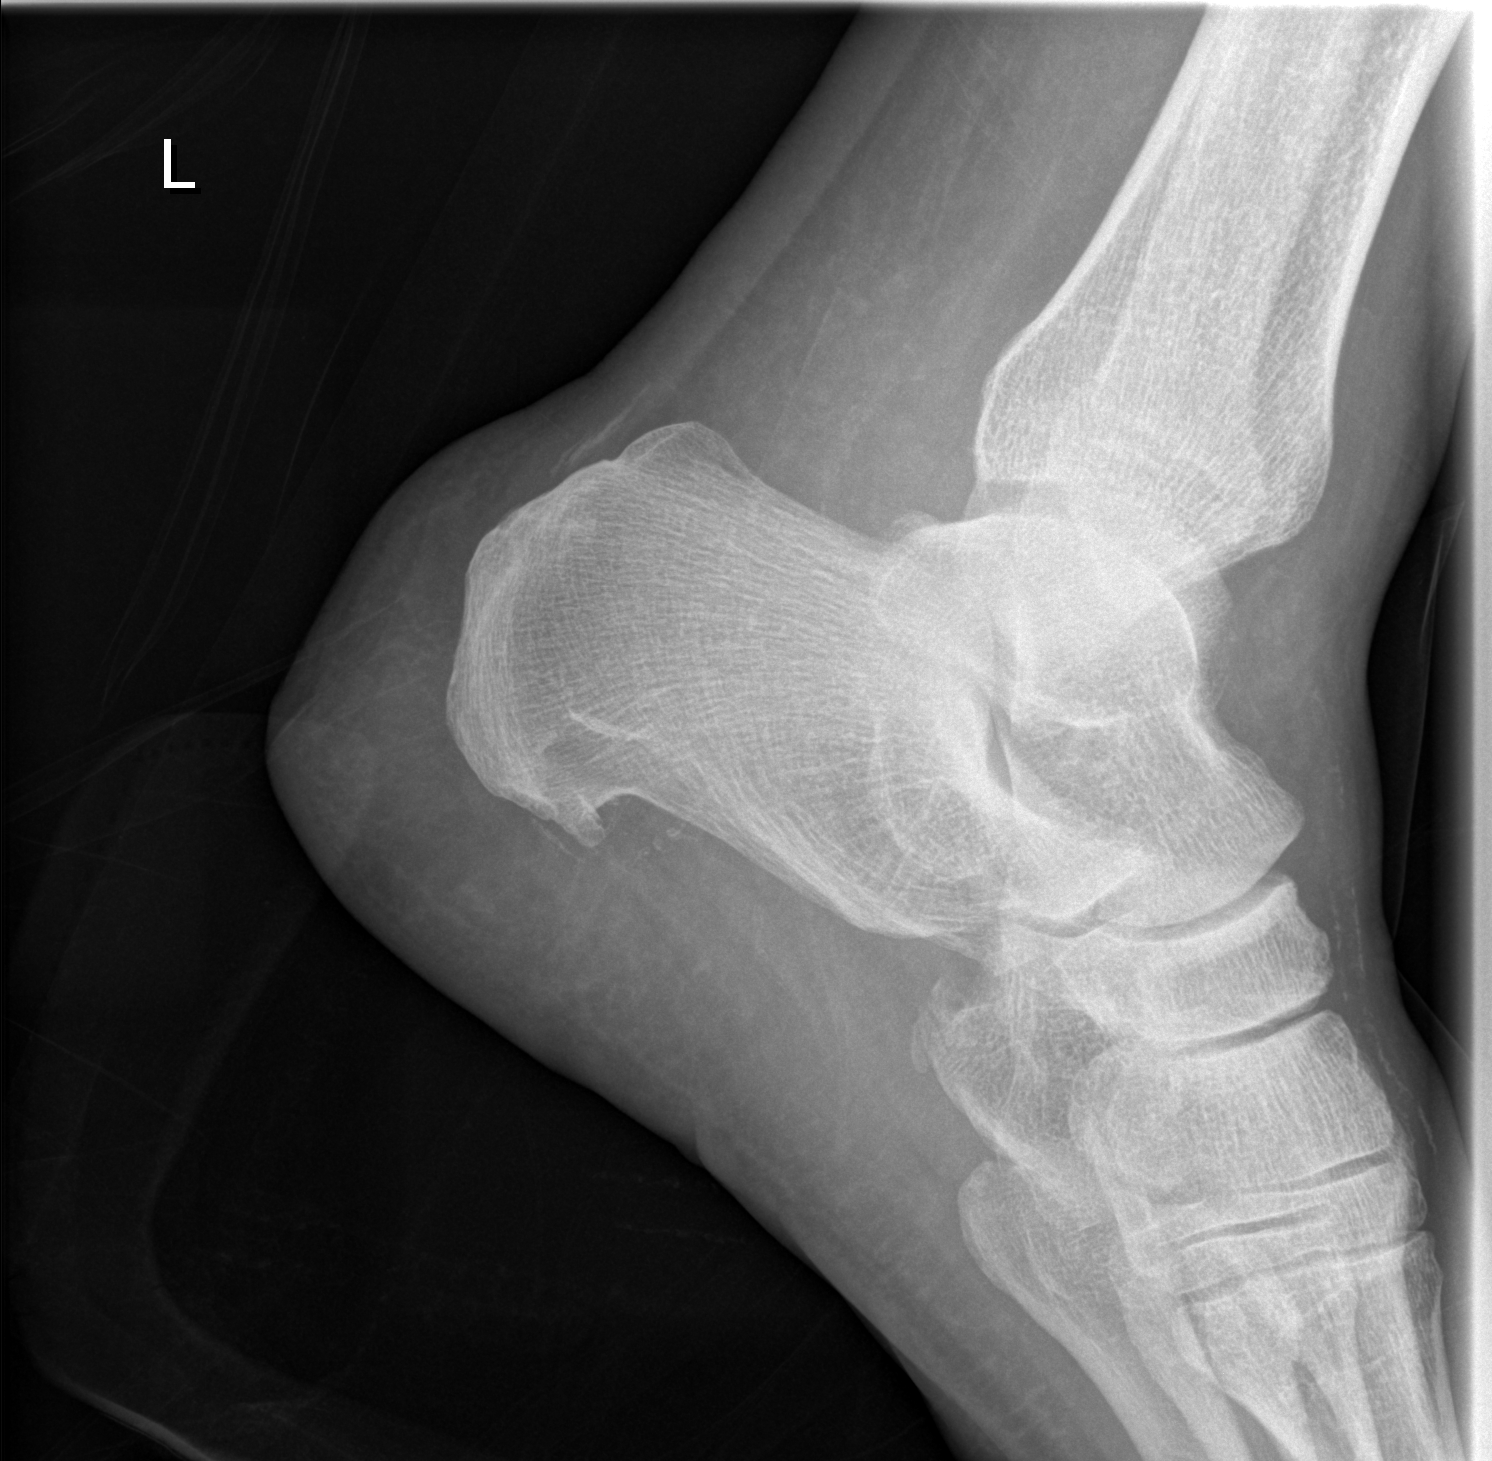

[3 of 3 positions shown; findings below may reference images not displayed]

FINDINGS: Comminuted bimalleolar fractures are present.  Extensive
soft tissue swelling is noted.  There is minimal displacement.
Ankle joint is intact.  Small vessel calcifications are present.  A
small plantar calcaneal spur is evident.
IMPRESSION: 1.  Minimally-displaced comminuted bimalleolar fractures.
2.  Extensive soft tissue swelling.
3.  Small vessel calcifications compatible with the known diagnosis
of diabetes.

## 2010-04-12 IMAGING — CR DG ANKLE COMPLETE 3+V*R*
3 series · 3 of 3 positions shown · non-contrast
Comparison: None.

CLINICAL DATA: Bilateral lower extremity trauma.  Pain.

RIGHT ANKLE - COMPLETE 3+ VIEW

[t ankle joint ap right]
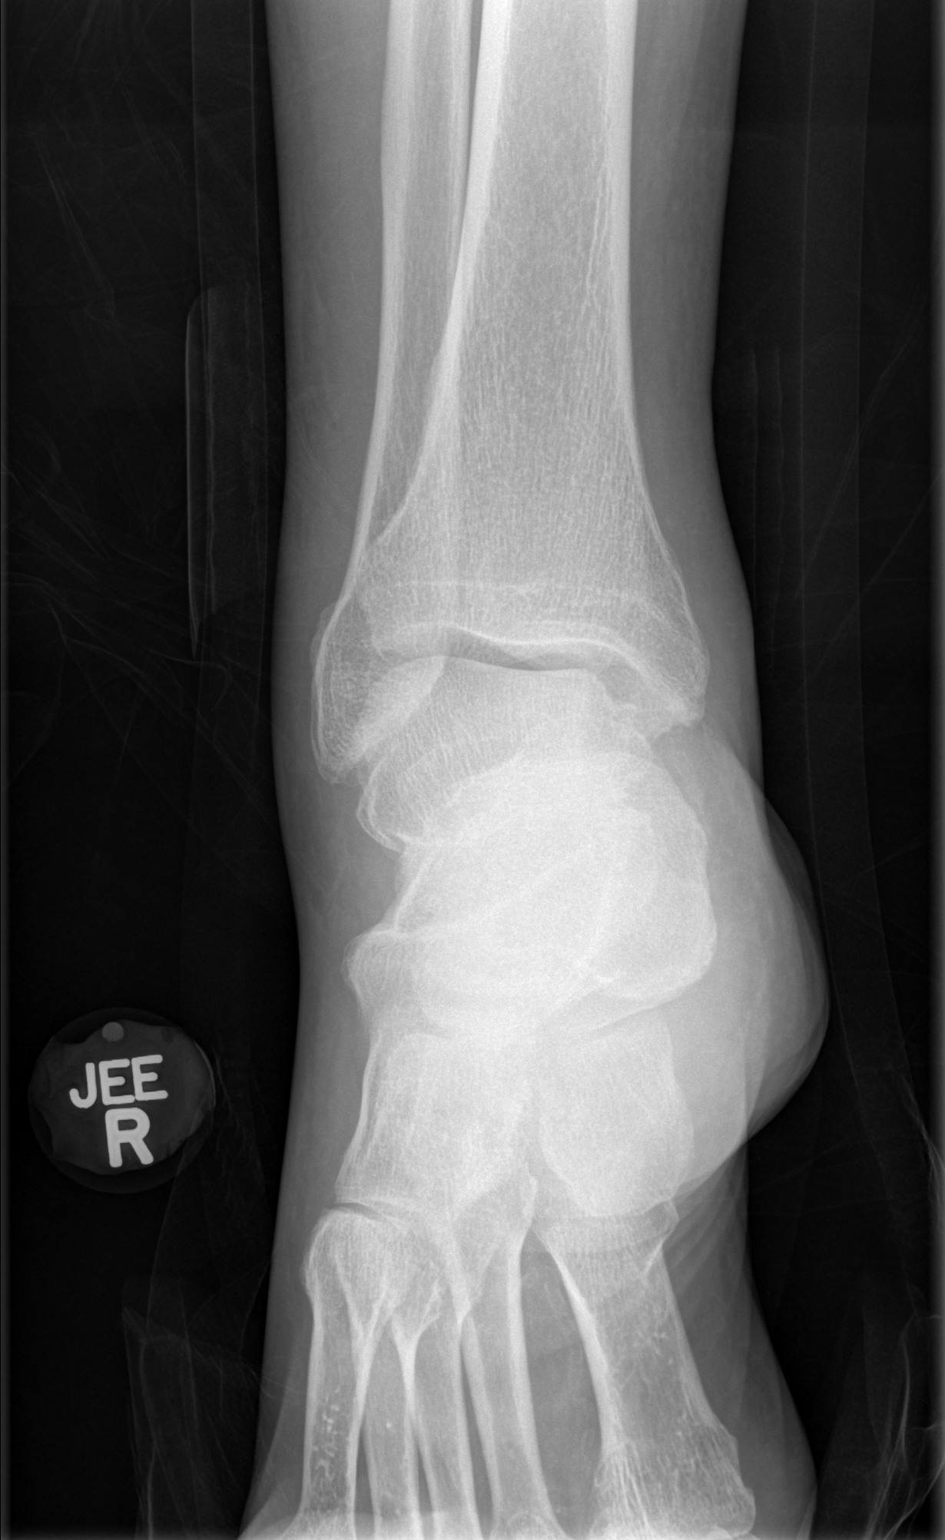

[t ankle joint oblique right]
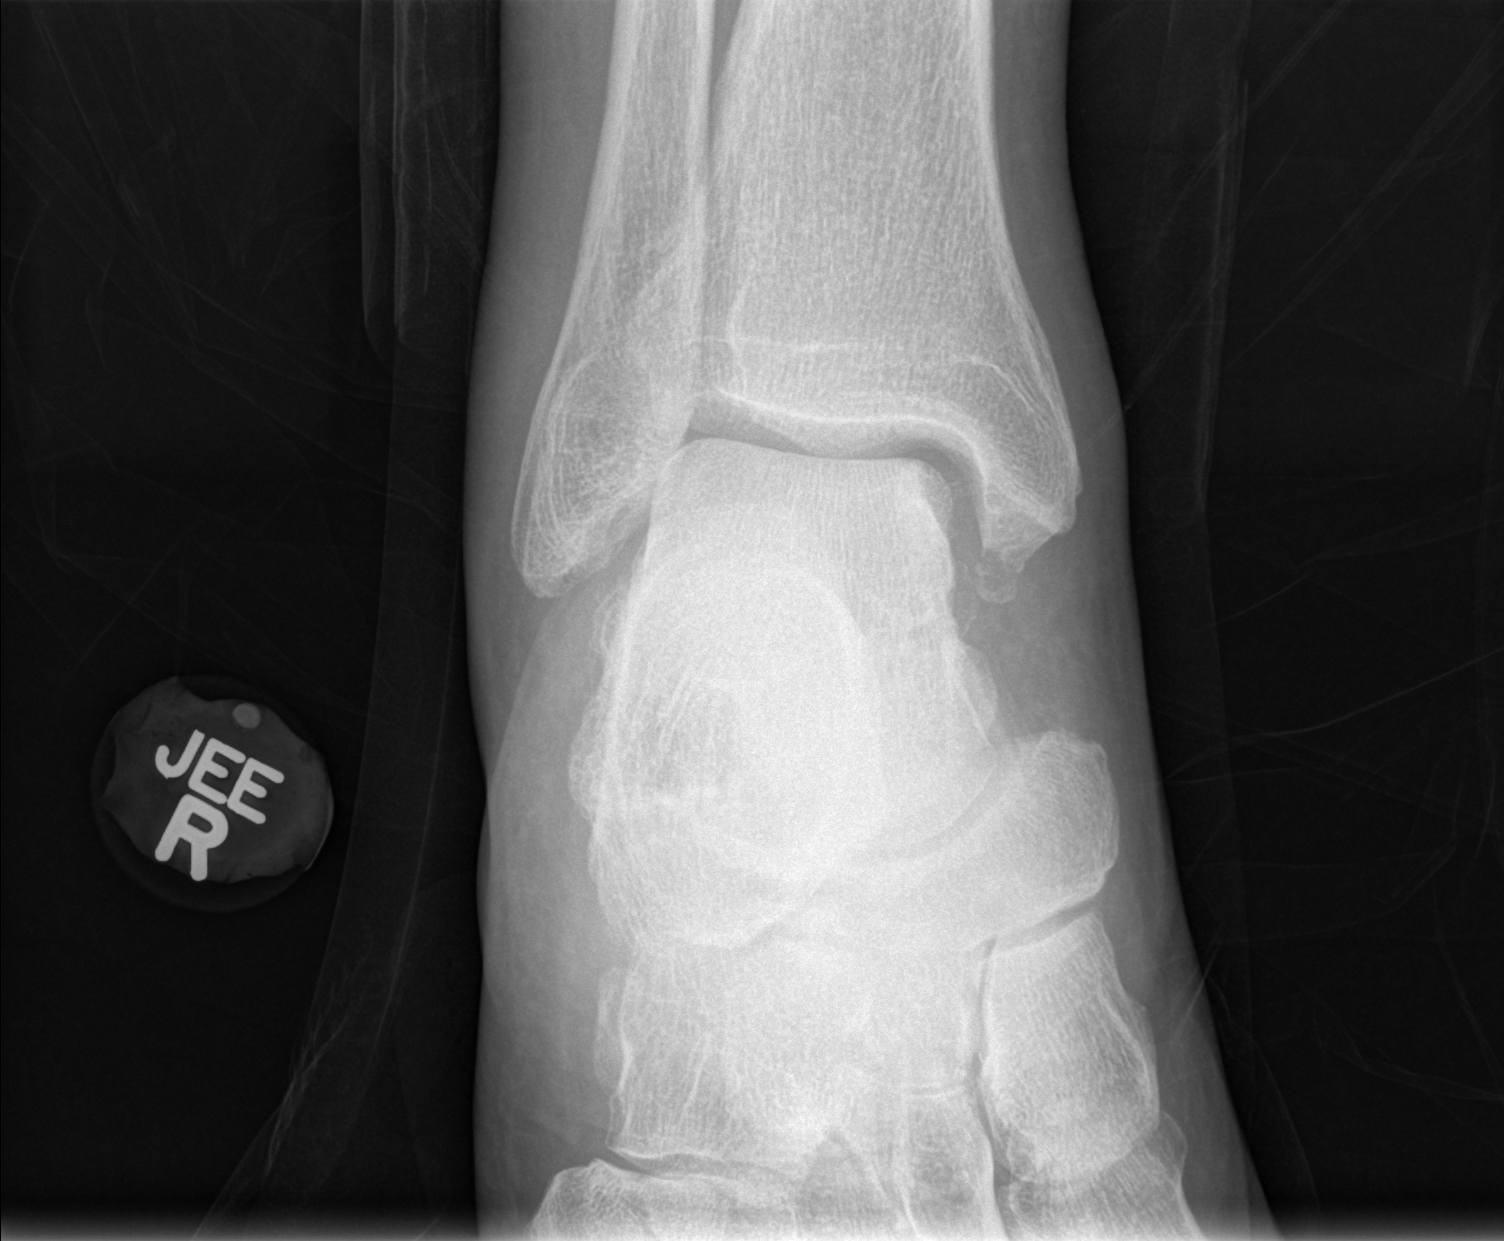

[t ankle joint lat right *]
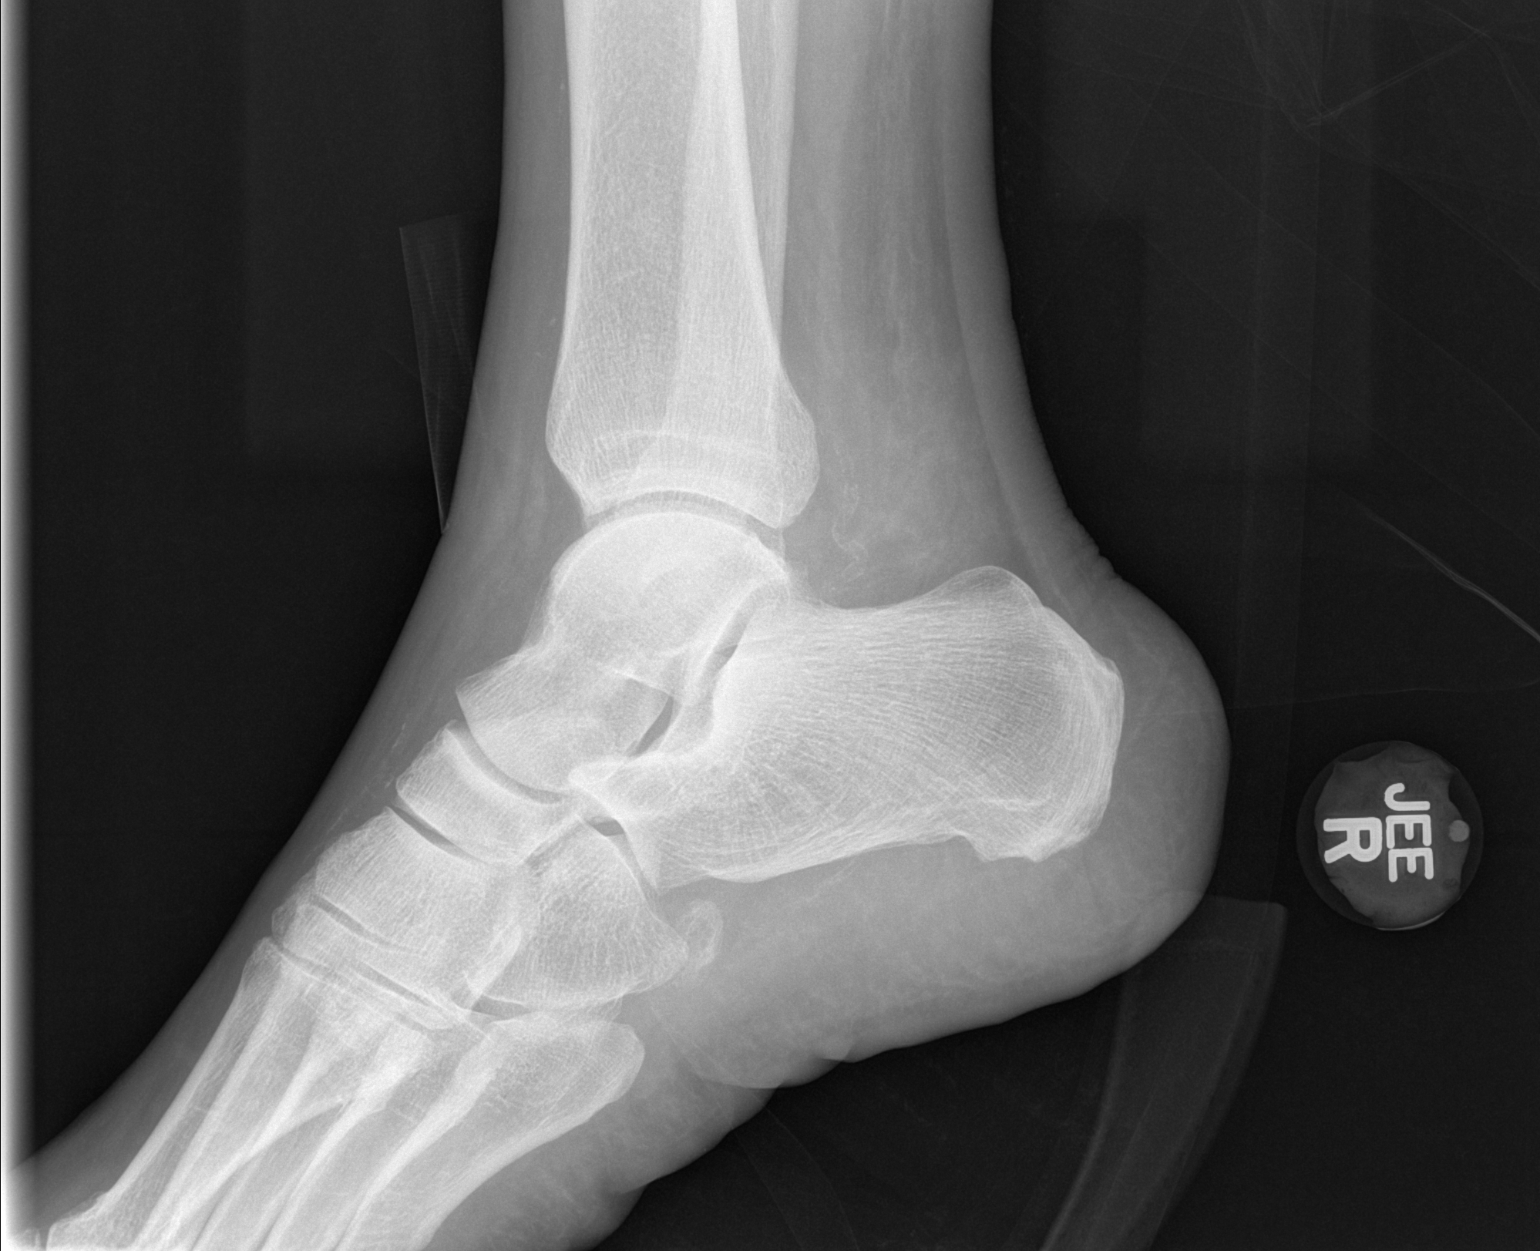

[3 of 3 positions shown; findings below may reference images not displayed]

FINDINGS: A minimally-displaced fracture is present along the
medial malleolus.  Soft tissue swelling is present bilaterally.
There is no definite lateral malleolar fracture.  The ankle joint
is located.  Small vessel calcifications are compatible with the
given diagnosis of diabetes.
IMPRESSION: 1.  Minimally-displaced distal medial malleolar fracture.
2.  Bilateral soft tissue swelling without additional fracture.
3.  Small vessel calcifications compatible with the given diagnosis
of diabetes.

## 2010-04-12 IMAGING — CR DG KNEE COMPLETE 4+V*L*
4 series · 4 of 4 positions shown · non-contrast
Comparison: None.

CLINICAL DATA: Bilateral lower extremity trauma.  Pain.

LEFT KNEE - COMPLETE 4+ VIEW

[t knee ap left]
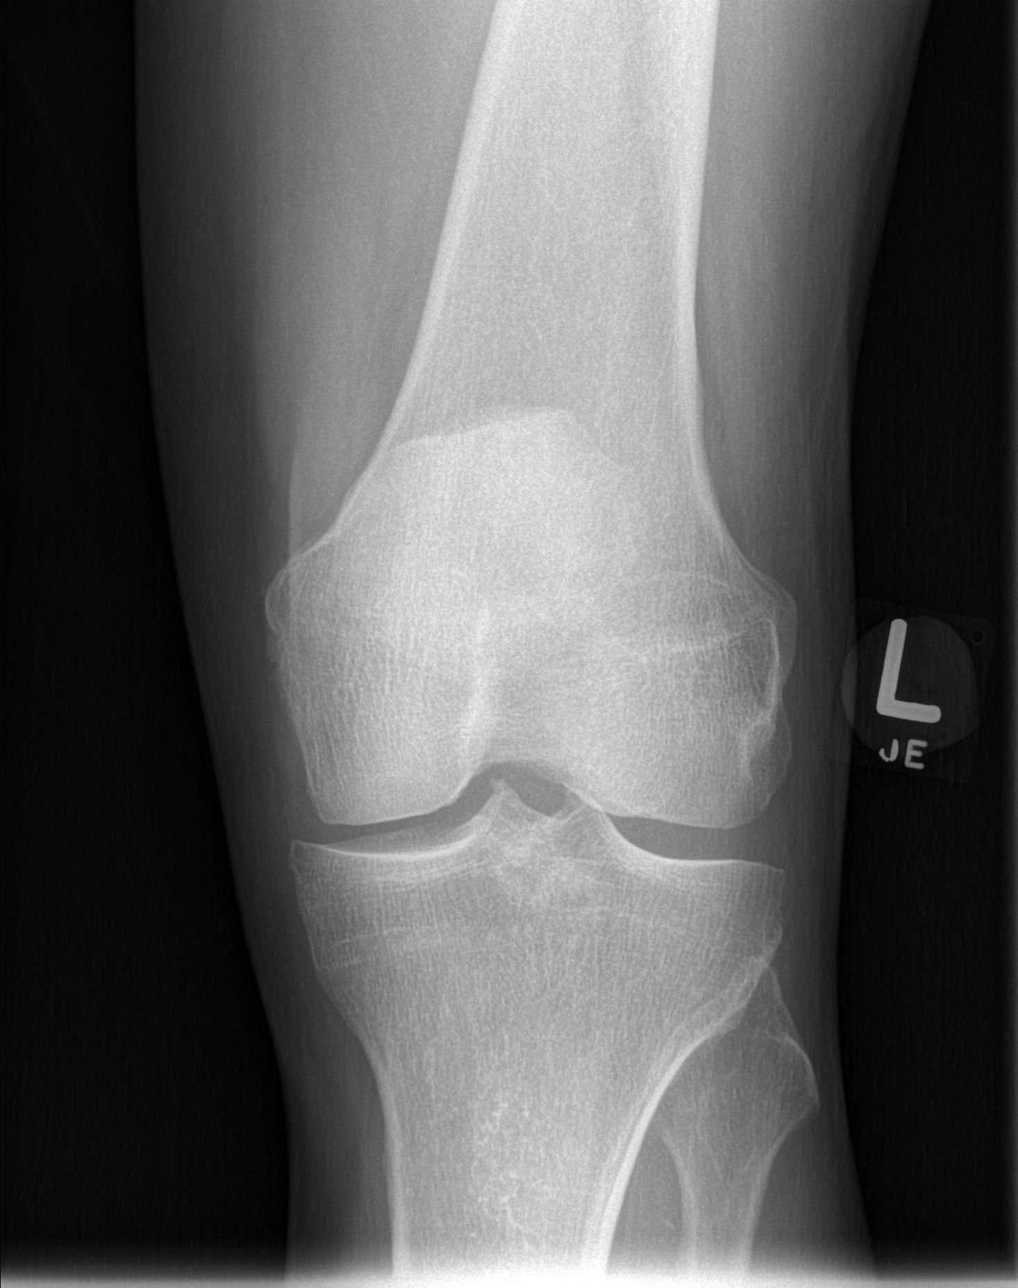

[t knee oblique left (1 of 2)]
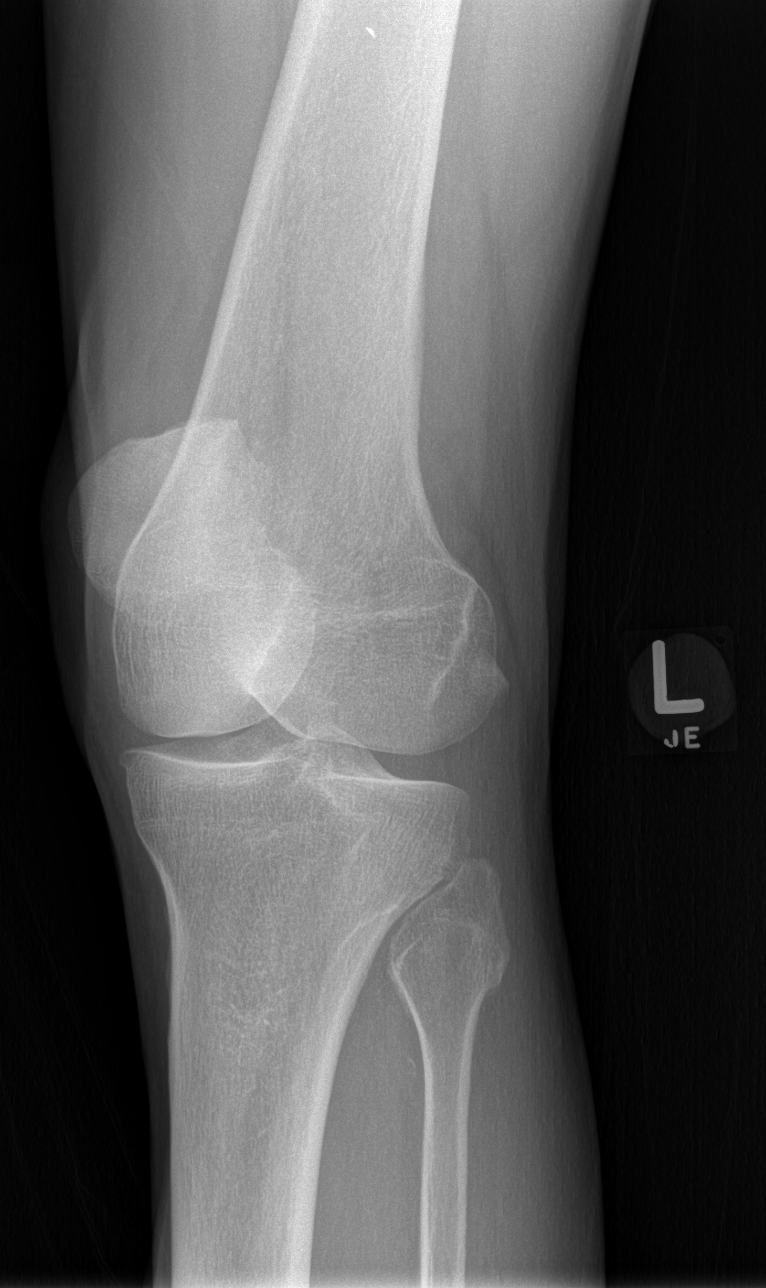

[t knee oblique left (2 of 2)]
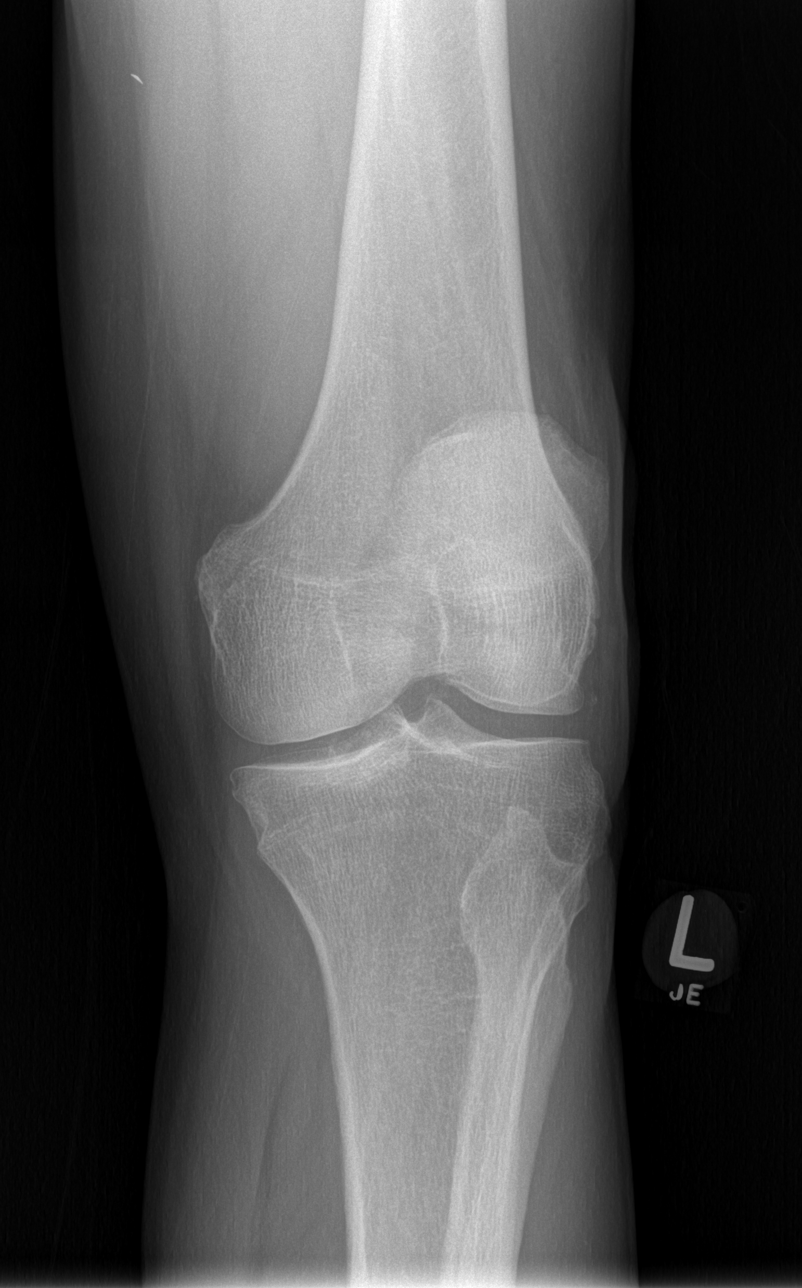

[t knee lat left]
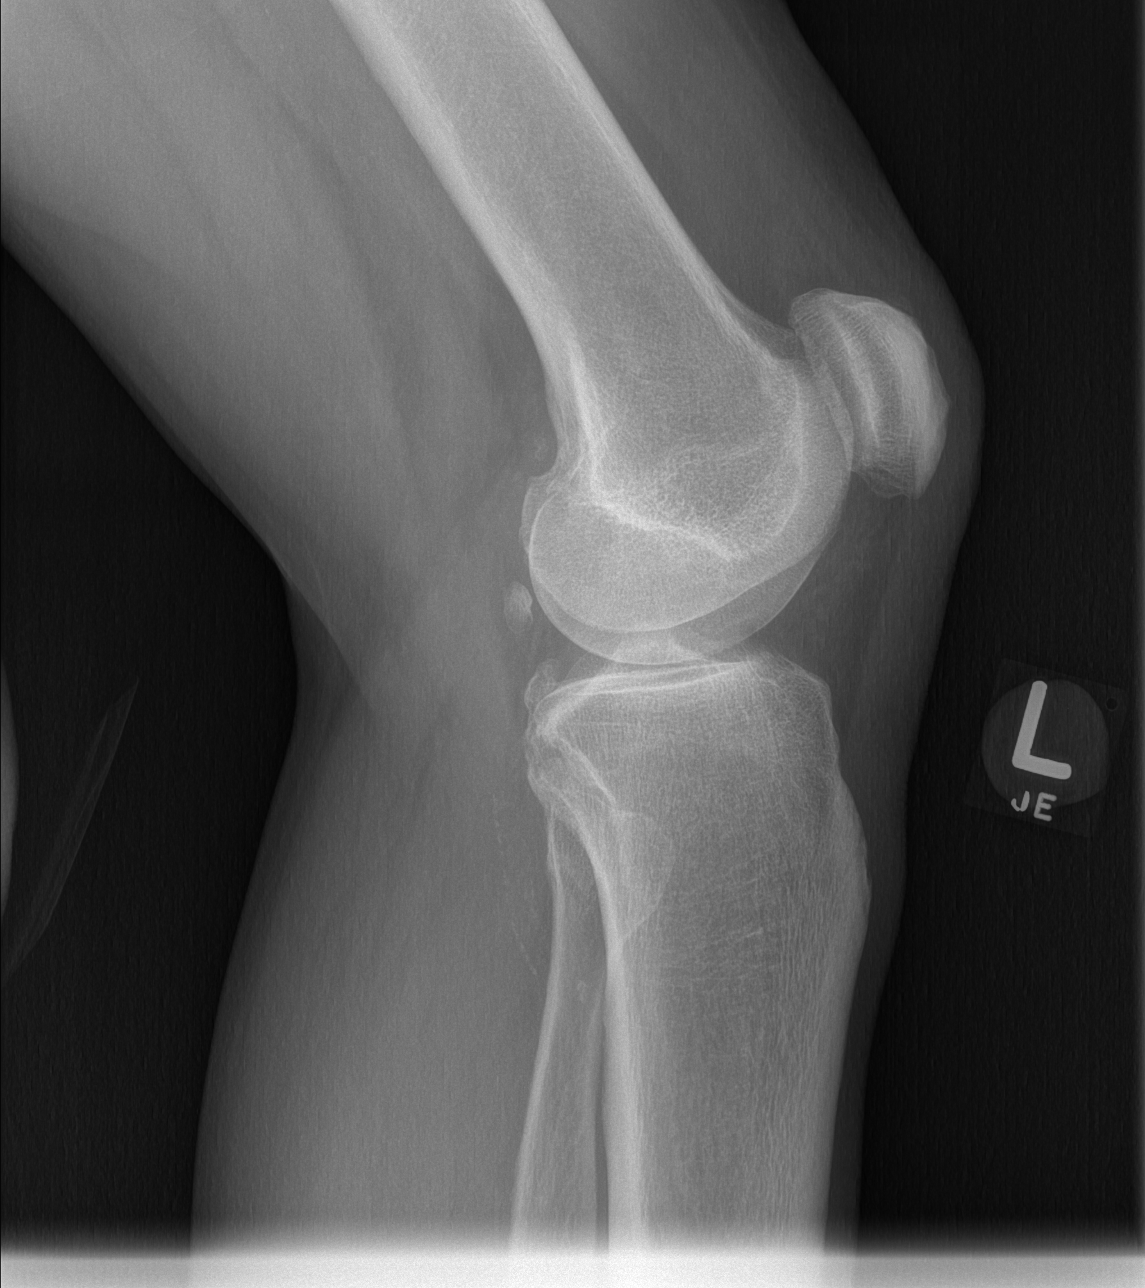

[4 of 4 positions shown; findings below may reference images not displayed]

FINDINGS: The left knee is located.  No acute bone or soft tissue
abnormality is present.  There is no significant joint effusion.
Vascular calcifications are noted.
IMPRESSION: 1.  No acute abnormality.
2.  Vascular calcifications compatible with the given diagnosis of
diabetes.

## 2011-02-20 ENCOUNTER — Other Ambulatory Visit: Payer: Self-pay | Admitting: Family Medicine

## 2011-02-20 ENCOUNTER — Ambulatory Visit
Admission: RE | Admit: 2011-02-20 | Discharge: 2011-02-20 | Disposition: A | Discharge status: Home / Self Care | Payer: Medicare Other | Source: Ambulatory Visit | Attending: Family Medicine | Admitting: Family Medicine

## 2011-02-20 IMAGING — CT CT HEAD W/O CM
4 of 5 series · 19 of 37 positions shown, 20 images · non-contrast
Comparison: None.

CLINICAL DATA: Chronic headaches

CT HEAD WITHOUT CONTRAST
TECHNIQUE: Contiguous axial images were obtained from the base of
the skull through the vertex without contrast.

[Series 4: ax soft · axial · 0.36mm/px · z∈[+71,+163]mm · 7 of 51 slices shown]
[im 7/51  brain]
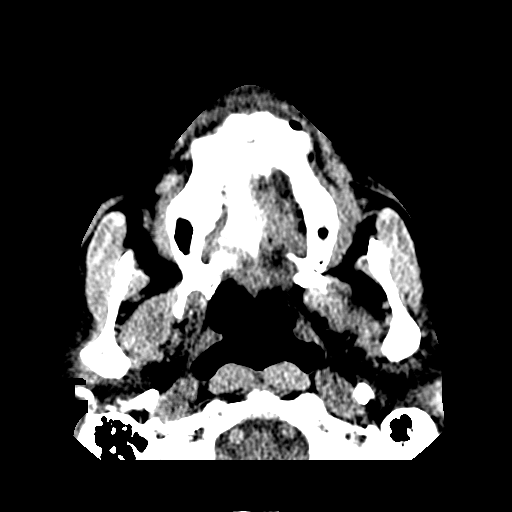
[im 13/51  brain]
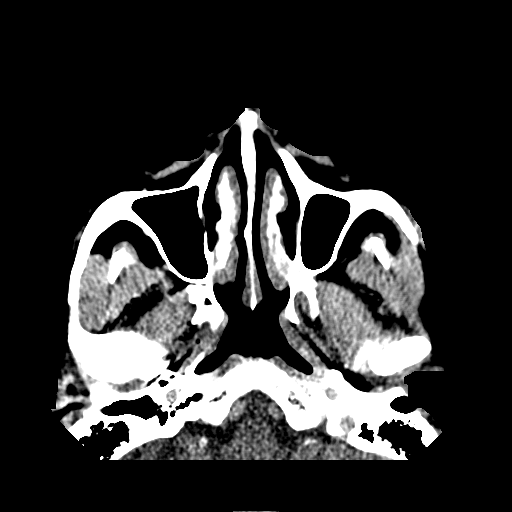
[im 19/51  brain]
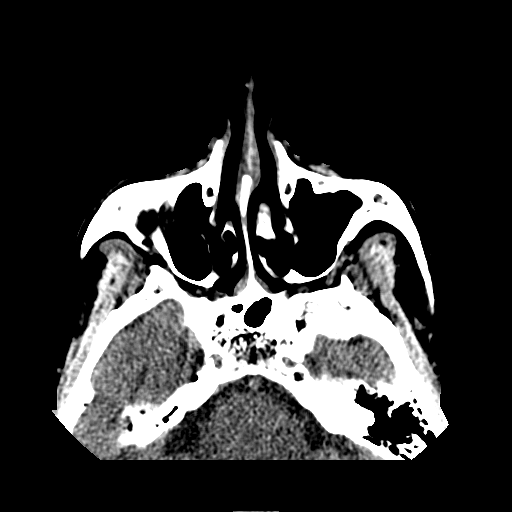
[im 26/51  brain]
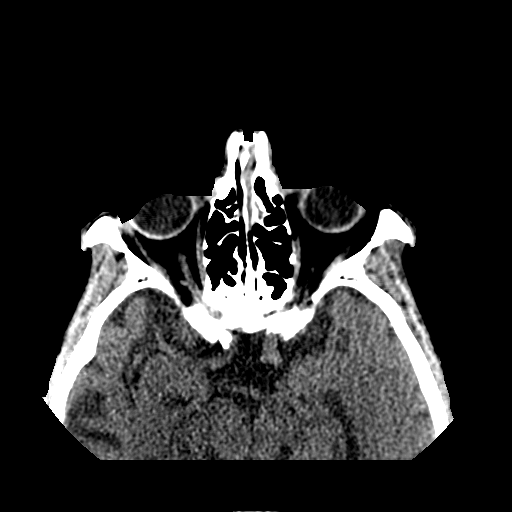
[im 32/51  brain]
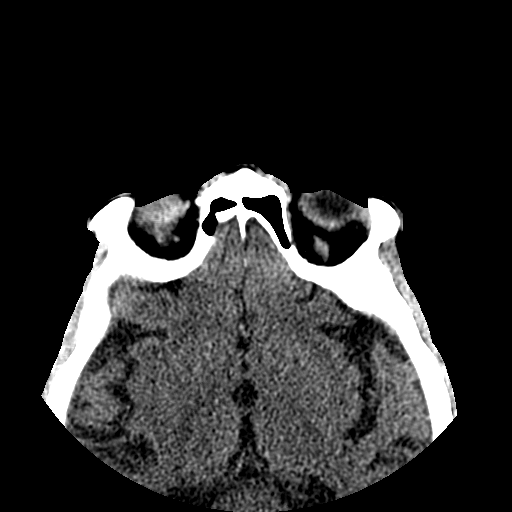
[im 38/51  brain]
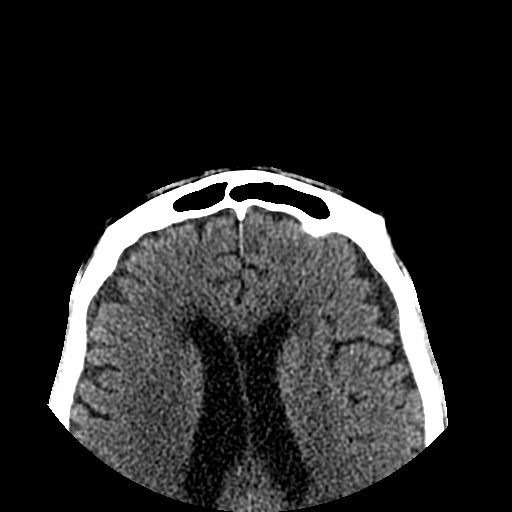
[im 44/51  brain]
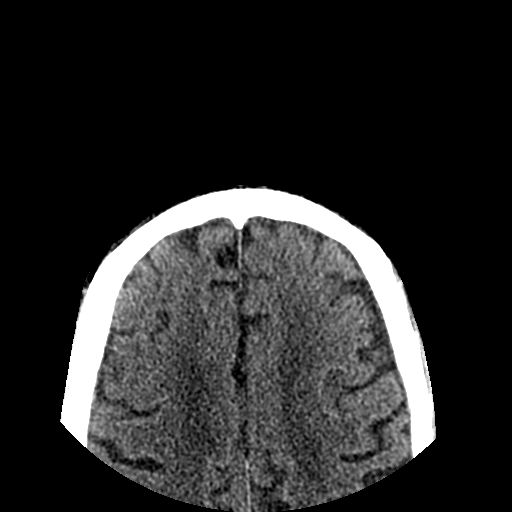

[Series 35: 3d filtered head w/o · axial · non-contrast · 0.49mm/px · z∈[+172,+221]mm · 2 of 28 slices shown, 3 images]
[im 10/28  brain]
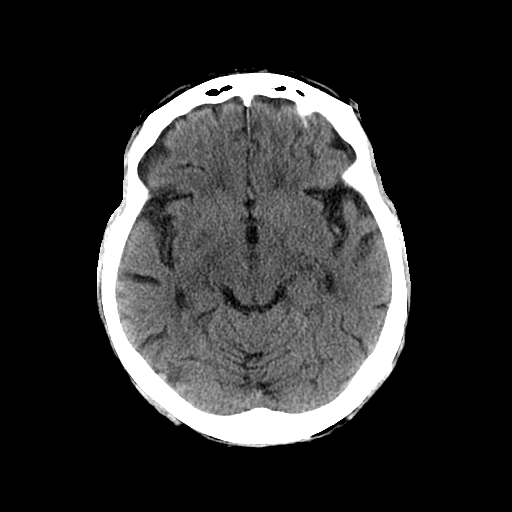
[im 10/28  bone]
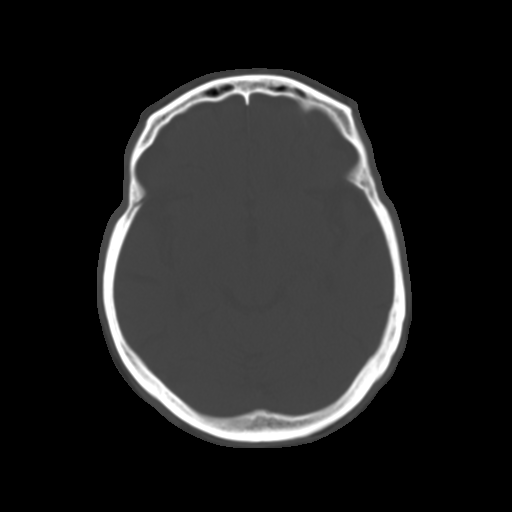
[im 19/28  brain]
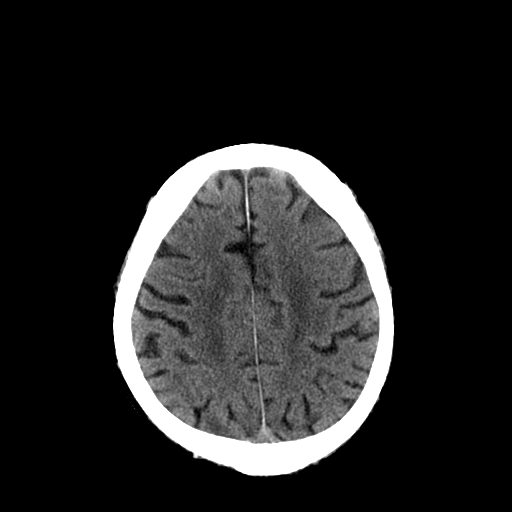

[Series 601: coronal facial · coronal · 0.36mm/px · 3 of 84 slices shown]
[im 28/84  brain]
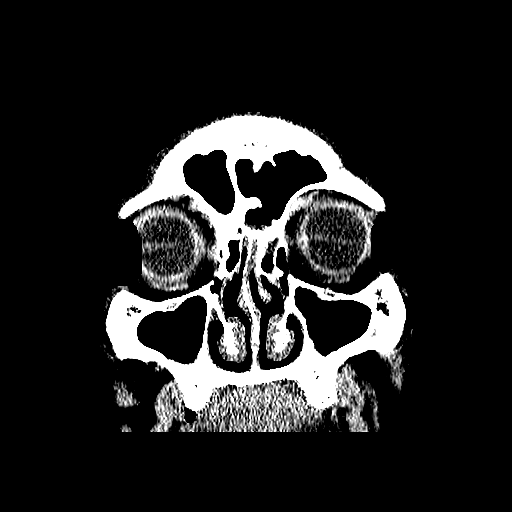
[im 42/84  brain]
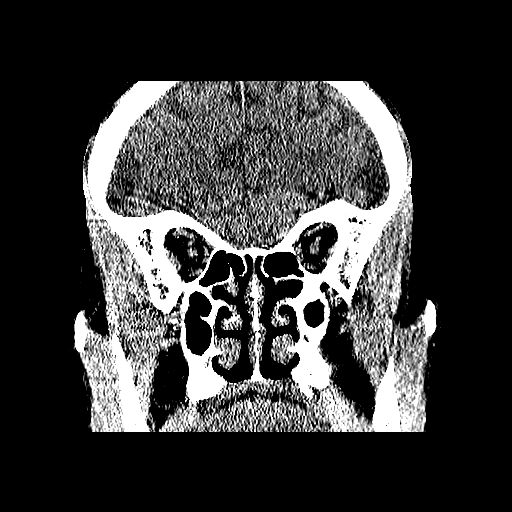
[im 56/84  brain]
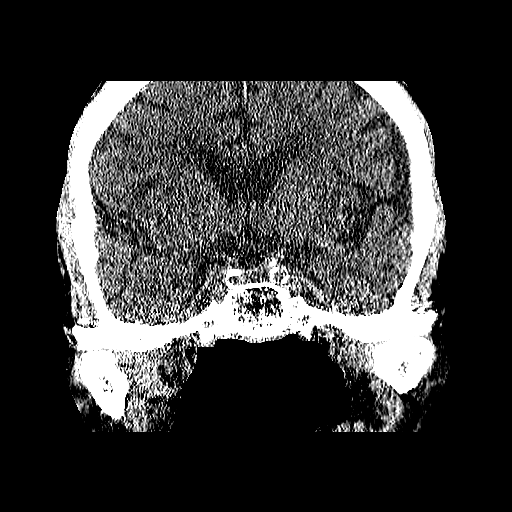

[Series 602: sagittal facial · sagittal · 0.36mm/px · 7 of 82 slices shown]
[im 7/82  brain]
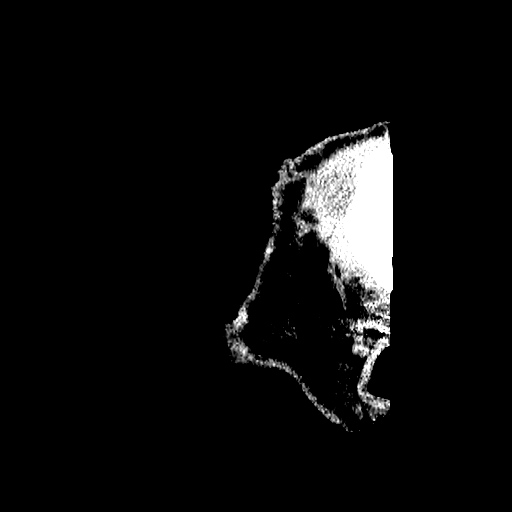
[im 21/82  brain]
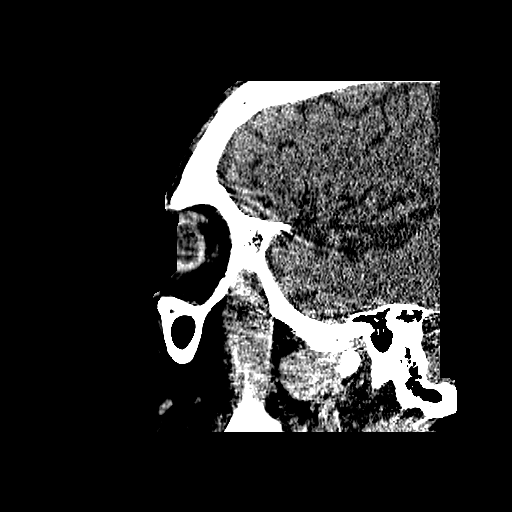
[im 28/82  brain]
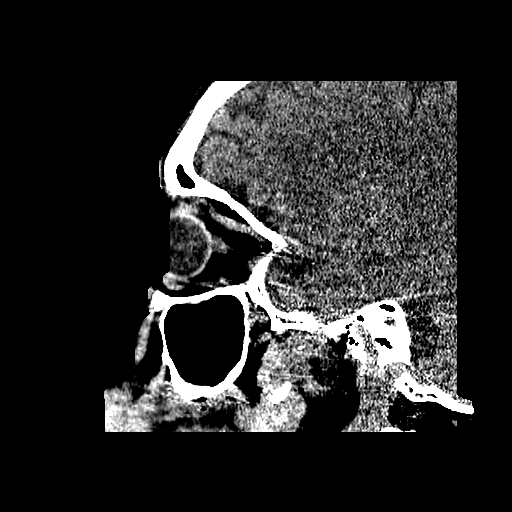
[im 34/82  brain]
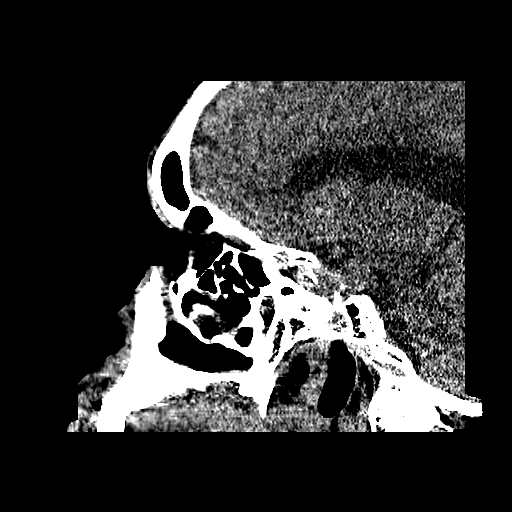
[im 48/82  brain]
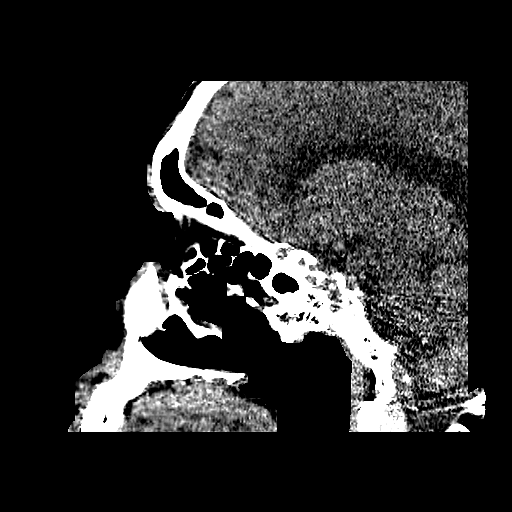
[im 55/82  brain]
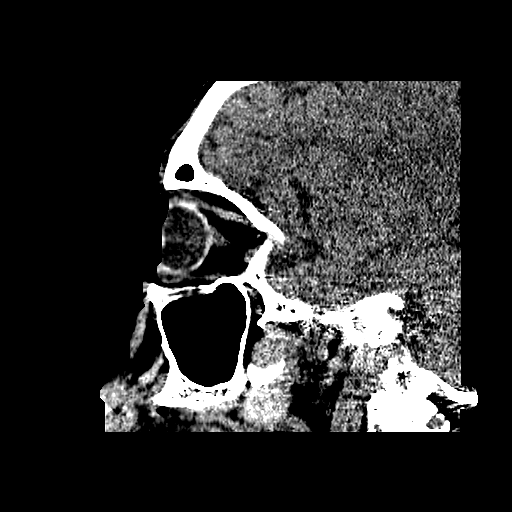
[im 61/82  brain]
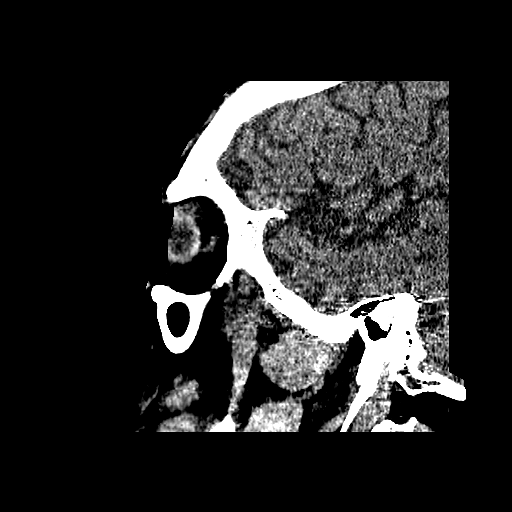

[19 of 37 positions shown; findings below may reference images not displayed]

FINDINGS: Bony calvarium appears intact.  No mass effect or midline
shift is noted.  Ventricular size is within normal limits.  Chronic
ischemic white matter disease is noted in the periventricular
regions.  There is no evidence of mass lesion, acute infarction or
hemorrhage.
IMPRESSION: Chronic ischemic white matter disease.  No acute intracranial
abnormality seen.

## 2011-02-22 ENCOUNTER — Encounter (INDEPENDENT_AMBULATORY_CARE_PROVIDER_SITE_OTHER): Payer: Self-pay | Admitting: General Surgery

## 2011-02-22 ENCOUNTER — Ambulatory Visit (INDEPENDENT_AMBULATORY_CARE_PROVIDER_SITE_OTHER): Payer: Medicare Other | Admitting: General Surgery

## 2011-02-22 DIAGNOSIS — E1121 Type 2 diabetes mellitus with diabetic nephropathy: Secondary | ICD-10-CM | POA: Insufficient documentation

## 2011-02-22 DIAGNOSIS — Z794 Long term (current) use of insulin: Secondary | ICD-10-CM | POA: Insufficient documentation

## 2011-02-22 DIAGNOSIS — I2510 Atherosclerotic heart disease of native coronary artery without angina pectoris: Secondary | ICD-10-CM

## 2011-02-22 DIAGNOSIS — R51 Headache: Secondary | ICD-10-CM

## 2011-02-22 DIAGNOSIS — R700 Elevated erythrocyte sedimentation rate: Secondary | ICD-10-CM

## 2011-02-22 DIAGNOSIS — G459 Transient cerebral ischemic attack, unspecified: Secondary | ICD-10-CM | POA: Insufficient documentation

## 2011-02-22 DIAGNOSIS — E109 Type 1 diabetes mellitus without complications: Secondary | ICD-10-CM

## 2011-02-22 NOTE — Progress Notes (Signed)
Subjective:     Patient ID: Justin [name], male   DOB: [birth year], 76 y.o.   MRN: [id number] Patient is a 76 year old male diabetic of long-standing known TIAs and coronary artery disease on pain who has recently switched his medical care from of breath in the clinic to the urgent medical care facility where he was seen earlier this month he complained of a frontal headache predominantly left side they did a sed rate and it was markedly elevated at 119 and then I referred him for maxillofacial CTs of contrast in a CT of the head without contrast and this shows chronic ischemic white matter disease no active intracranial abnormality and no problems with sinus disease. I started him on prednisone 40 mg a day and referred him to our office for a temporal artery biopsy. On her view of the olecranon and medical records from the [facility] it appears that he had had a sed rate approximately year earlier that was about 25 so the deltoid has been a change in his sedimentation rate he's had other problems extremity problems recently and the patient states that since he was started on prednisone is no longer got the headache HPI   Review of Systems Past Surgical History  Procedure Date  . Back surgery 1985  . Neck surgery 1985   Current Outpatient Prescriptions  Medication Sig Dispense Refill  . aspirin 81 MG tablet Take 81 mg by mouth daily.        . clopidogrel (PLAVIX) 75 MG tablet       . ferrous fumarate (HEMOCYTE - 106 MG FE) 325 (106 FE) MG TABS Take 1 tablet by mouth.        . fish oil-omega-3 fatty acids 1000 MG capsule Take 2 g by mouth daily.        Marland Kitchen gabapentin (NEURONTIN) 300 MG capsule Take 300 mg by mouth 3 (three) times daily.        Marland Kitchen HYDROcodone-acetaminophen (VICODIN) 5-500 MG per tablet       . insulin glargine (LANTUS) 100 UNIT/ML injection Inject into the skin at bedtime.        Marland Kitchen l-methylfolate-B6-B12 (METANX) 3-35-2 MG TABS Take 1 tablet by mouth daily.        Marland Kitchen LEVEMIR 100  UNIT/ML injection       . metFORMIN (GLUCOPHAGE) 1000 MG tablet Take 1,000 mg by mouth 2 (two) times daily with a meal.        . Multiple Vitamins-Minerals (MULTIVITAMIN WITH MINERALS) tablet Take 1 tablet by mouth daily.        Marland Kitchen omeprazole (PRILOSEC) 20 MG capsule Take 20 mg by mouth daily.        . potassium phosphate, monobasic, (K-PHOS ORIGINAL) 500 MG tablet Take 500 mg by mouth 2 (two) times daily.        . predniSONE (DELTASONE) 20 MG tablet       . simvastatin (ZOCOR) 40 MG tablet Take 40 mg by mouth at bedtime.        . sitaGLIPtin (JANUVIA) 100 MG tablet Take 100 mg by mouth daily.        Marland Kitchen triamterene-hydrochlorothiazide (MAXZIDE-25) 37.5-25 MG per tablet Take 1 tablet by mouth daily.        . Vitamin D, Ergocalciferol, (DRISDOL) 50000 UNITS CAPS Take 50,000 Units by mouth.         No Known Allergies      Objective:   Physical Exam BP 146/86  Pulse 72  Temp(Src)  97.2 F (36.2 C) (Temporal)  Resp 16  Ht 6' (1.829 m)  Wt 194 lb 4 oz (88.111 kg)  BMI 26.34 kg/m2  Pressures and ulnar gentleman who is quite talkative in no acute distress at this time there is no neurological defect that I can appreciate and his palpation of his arteries in the left temporal area of appears to have normal blood flow but I did not use a Doppler he's denies any change in his eyes or TIA-type symptoms recently and noted that he is on Plavix I called and talked to the medical position not Dr. Dallas Schimke who attacks he seen him and they were helpful and pulled up the old records of the normal sed rate a year ago. His Plavix and then started with because of TIAs also coronary disease and I would think that the better finger dosed, hold his Plavix for about 2 days proceed with the temporal artery biopsy then resume her Plavix the patient understands it is much more likely to get some bruising will be on Plavix think the risk of old serious issue with stopping the Plavix for any length of time is of significant  risk also. I'm not sure when his head Doppler study but his headache did not appear to be a TIA-type headache to me.  Chest breath sounds bilateral cardiac negative abdomen is nontender and his wife with him at this time    Assessment:     Possible temporal arteritis in a patient with coronary artery disease and TIAs  On Plavix.patient has been started on prednisone 40 mg a dayand I will see if I given multiple temporal artery biopsy on Monday or Tuesday at [facility] day surgery with local anesthesia in the minor room   Plan:     Will call pt in am

## 2011-02-22 NOTE — H&P (Signed)
Iona Coach, MD 02/22/2011 6:39 PM Signed  Subjective:   Patient ID: Justin [name], male DOB: Dec 25, 1934, 76 y.o. MRN: [id number]  Patient is a 76 year old male diabetic of long-standing known TIAs and coronary artery disease on pain who has recently switched his medical care from of breath in the clinic to the urgent medical care facility where he was seen earlier this month he complained of a frontal headache predominantly left side they did a sed rate and it was markedly elevated at 119 and then I referred him for maxillofacial CTs of contrast in a CT of the head without contrast and this shows chronic ischemic white matter disease no active intracranial abnormality and no problems with sinus disease. I started him on prednisone 40 mg a day and referred him to our office for a temporal artery biopsy. On her view of the olecranon and medical records from the [facility] it appears that he had had a sed rate approximately year earlier that was about 25 so the deltoid has been a change in his sedimentation rate he's had other problems extremity problems recently and the patient states that since he was started on prednisone is no longer got the headache  HPI  Review of Systems  Past Surgical History   Procedure  Date   .  Back surgery  1985   .  Neck surgery  1985    Current Outpatient Prescriptions   Medication  Sig  Dispense  Refill   .  aspirin 81 MG tablet  Take 81 mg by mouth daily.     .  clopidogrel (PLAVIX) 75 MG tablet      .  ferrous fumarate (HEMOCYTE - 106 MG FE) 325 (106 FE) MG TABS  Take 1 tablet by mouth.     .  fish oil-omega-3 fatty acids 1000 MG capsule  Take 2 g by mouth daily.     Marland Kitchen  gabapentin (NEURONTIN) 300 MG capsule  Take 300 mg by mouth 3 (three) times daily.     Marland Kitchen  HYDROcodone-acetaminophen (VICODIN) 5-500 MG per tablet      .  insulin glargine (LANTUS) 100 UNIT/ML injection  Inject into the skin at bedtime.     Marland Kitchen  l-methylfolate-B6-B12 (METANX) 3-35-2 MG  TABS  Take 1 tablet by mouth daily.     Marland Kitchen  LEVEMIR 100 UNIT/ML injection      .  metFORMIN (GLUCOPHAGE) 1000 MG tablet  Take 1,000 mg by mouth 2 (two) times daily with a meal.     .  Multiple Vitamins-Minerals (MULTIVITAMIN WITH MINERALS) tablet  Take 1 tablet by mouth daily.     Marland Kitchen  omeprazole (PRILOSEC) 20 MG capsule  Take 20 mg by mouth daily.     .  potassium phosphate, monobasic, (K-PHOS ORIGINAL) 500 MG tablet  Take 500 mg by mouth 2 (two) times daily.     .  predniSONE (DELTASONE) 20 MG tablet      .  simvastatin (ZOCOR) 40 MG tablet  Take 40 mg by mouth at bedtime.     .  sitaGLIPtin (JANUVIA) 100 MG tablet  Take 100 mg by mouth daily.     Marland Kitchen  triamterene-hydrochlorothiazide (MAXZIDE-25) 37.5-25 MG per tablet  Take 1 tablet by mouth daily.     .  Vitamin D, Ergocalciferol, (DRISDOL) 50000 UNITS CAPS  Take 50,000 Units by mouth.      No Known Allergies   Objective:   Physical Exam BP 146/86  Pulse 72  Temp(Src) 97.2 F (36.2 C) (Temporal)  Resp 16  Ht 6' (1.829 m)  Wt 194 lb 4 oz (88.111 kg)  BMI 26.34 kg/m2  Pressures and ulnar gentleman who is quite talkative in no acute distress at this time there is no neurological defect that I can appreciate and his palpation of his arteries in the left temporal area of appears to have normal blood flow but I did not use a Doppler he's denies any change in his eyes or TIA-type symptoms recently and noted that he is on Plavix I called and talked to the medical position not Dr. Dallas Schimke who attacks he seen him and they were helpful and pulled up the old records of the normal sed rate a year ago. His Plavix and then started with because of TIAs also coronary disease and I would think that the better finger dosed, hold his Plavix for about 2 days proceed with the temporal artery biopsy then resume her Plavix the patient understands it is much more likely to get some bruising will be on Plavix think the risk of old serious issue with stopping the Plavix  for any length of time is of significant risk also. I'm not sure when his head Doppler study but his headache did not appear to be a TIA-type headache to me.  Chest breath sounds bilateral cardiac negative abdomen is nontender and his wife with him at this time   Assessment:    Possible temporal arteritis in a patient with coronary artery disease and TIAs On Plavix.patient has been started on prednisone 40 mg a dayand I will see if I given multiple temporal artery biopsy on Monday or Tuesday at [facility] day surgery with local anesthesia in the minor room  Plan:

## 2011-02-22 NOTE — Progress Notes (Signed)
Addended by: Iona Coach on: 02/22/2011 06:51 PM   Modules accepted: Orders

## 2011-02-26 ENCOUNTER — Other Ambulatory Visit (INDEPENDENT_AMBULATORY_CARE_PROVIDER_SITE_OTHER): Payer: Self-pay | Admitting: General Surgery

## 2011-02-26 ENCOUNTER — Ambulatory Visit (HOSPITAL_BASED_OUTPATIENT_CLINIC_OR_DEPARTMENT_OTHER)
Admission: RE | Admit: 2011-02-26 | Discharge: 2011-02-26 | Disposition: A | Discharge status: Home / Self Care | Payer: Medicare Other | Source: Ambulatory Visit | Attending: General Surgery | Admitting: General Surgery

## 2011-02-26 ENCOUNTER — Encounter (HOSPITAL_BASED_OUTPATIENT_CLINIC_OR_DEPARTMENT_OTHER)
Admission: RE | Disposition: A | Discharge status: Home / Self Care | Payer: Self-pay | Source: Ambulatory Visit | Attending: General Surgery

## 2011-02-26 DIAGNOSIS — R700 Elevated erythrocyte sedimentation rate: Secondary | ICD-10-CM | POA: Insufficient documentation

## 2011-02-26 DIAGNOSIS — R51 Headache: Secondary | ICD-10-CM

## 2011-02-26 DIAGNOSIS — I2510 Atherosclerotic heart disease of native coronary artery without angina pectoris: Secondary | ICD-10-CM | POA: Insufficient documentation

## 2011-02-26 DIAGNOSIS — Z8673 Personal history of transient ischemic attack (TIA), and cerebral infarction without residual deficits: Secondary | ICD-10-CM | POA: Insufficient documentation

## 2011-02-26 DIAGNOSIS — E119 Type 2 diabetes mellitus without complications: Secondary | ICD-10-CM | POA: Insufficient documentation

## 2011-02-26 HISTORY — PX: TEMPORAL ARTERY BIOPSY / LIGATION: SUR132

## 2011-02-26 HISTORY — PX: ARTERY BIOPSY: SHX891

## 2011-02-26 SURGERY — BIOPSY TEMPORAL ARTERY
Anesthesia: LOCAL | Site: Face | Laterality: Left | Wound class: Clean

## 2011-02-26 MED ORDER — LIDOCAINE HCL (PF) 1 % IJ SOLN
INTRAMUSCULAR | Status: DC | PRN
  Administered 2011-02-26: 3 mL

## 2011-02-26 SURGICAL SUPPLY — 13 items
BLADE SURG 15 STRL LF DISP TIS (BLADE) ×1 IMPLANT
BLADE SURG 15 STRL SS (BLADE) ×1
BLADE SURG ROTATE 9660 (MISCELLANEOUS) ×2 IMPLANT
CLOTH BEACON ORANGE TIMEOUT ST (SAFETY) ×2 IMPLANT
GLOVE ORTHO TXT STRL SZ7.5 (GLOVE) ×2 IMPLANT
MARKER SKIN DUAL TIP RULER LAB (MISCELLANEOUS) ×2 IMPLANT
NDL SAFETY ECLIPSE 18X1.5 (NEEDLE) ×1 IMPLANT
NEEDLE HYPO 18GX1.5 SHARP (NEEDLE) ×1
NEEDLE HYPO 25X1 1.5 SAFETY (NEEDLE) ×2 IMPLANT
SUT ETHILON 5 0 PS 2 18 (SUTURE) ×2 IMPLANT
SUT VICRYL 4-0 PS2 18IN ABS (SUTURE) ×2 IMPLANT
SWABSTICK POVIDONE IODINE SNGL (MISCELLANEOUS) ×6 IMPLANT
SYR CONTROL 10ML LL (SYRINGE) ×2 IMPLANT

## 2011-02-26 NOTE — Interval H&P Note (Signed)
History and Physical Interval Note:   02/26/2011   7:36 AM   Justin [name]  has presented today for surgery, with the diagnosis of Rule out temporal arteritis  The various methods of treatment have been discussed with the patient and family. After consideration of risks, benefits and other options for treatment, the patient has consented to  Procedure(s): BIOPSY TEMPORAL ARTERY as a surgical intervention .  The patients' history has been reviewed, patient examined, no change in status, stable for surgery.  I have reviewed the patients' chart and labs.  Questions were answered to the patient's satisfaction.   Pt took half of Friday Plavix none Sat or Sunday. He will resume all meds after L temperal artery biopsy. Permit signed and left side marked  Iona Coach  MD

## 2011-02-26 NOTE — H&P (View-Only) (Signed)
Addended by: Gurvir Schrom J on: 02/22/2011 06:51 PM   Modules accepted: Orders  

## 2011-02-26 NOTE — Op Note (Signed)
NAME:  TYLAR, AMBORN                   ACCOUNT NO.:  MEDICAL RECORD NO.:  [id number]  LOCATION:                                 FACILITY:  PHYSICIAN:  Anselm Pancoast. Zachery Dakins, M.D.  DATE OF BIRTH:  DATE OF PROCEDURE:  02/23/2011 DATE OF DISCHARGE:                              OPERATIVE REPORT   PREOPERATIVE DIAGNOSES:  Rule out temporal arteritis.  He has had a left- sided headache and elevated sed rate.  Diabetic with multiple problems including transient ischemic attacks and coronary artery disease.  POSTOPERATIVE DIAGNOSIS:  Rule out temporal arteritis.  OPERATIONS:  Left temporal artery biopsy with local anesthesia.  Minor surgery at [facility] Day [facility].  HISTORY:  Justin [name] is a 76 year old black male with long history of multiple medical problems including TIAs, coronary artery disease and he has also had an injury to his lower extremities approximately within the last year.  He has recently changed physicians from the [facility] and was seen in [facility] by Shanda Bumps Copland and on his revealed multiple medications, he stated that he had had a left-sided headache, sed rate was elevated at approximately 120 and then she did a frontal sinus studies that showed no evidence of any sinusitis and then also a CT of the head without contrast and it showed changes consistent with kind of atherosclerosis in elderly person.  The patient was referred to me for a left temporal artery biopsy and she did put him on steroids and this was started on approximately 5 days ago.  He was started 40 mg a day and when I saw him, and reviewed his pictures, course was taken is Plavix before his TIAs and coronary artery disease, and I had talked with Dr. Cyndie Chime associations and she said that the Plavix was not for the temporal arteritis, but for previous problems with his vascular problems and we elected to kind of stop the Plavix for only 2 days.  He took a half a dose on Friday, no  Plavix on Saturday and Sunday and he is here this morning for this left temporal artery biopsy. The patient was taken to the minor surgery room and then 1st had marked the area, medications were reviewed and then I shaved or clipped the hair over the temporal area and then used the Doppler to kind of track out the artery, and there was a good strong pulse and it was marked.  It kind of goes towards the eye at the top part of the ear, and then a Betadine prep was used and the area draped off sterilely.  The time-out was completed, and then we anesthetized the little area where the incision were about 5 mL of 1% plain Xylocaine, and then made a little incision, dissected down through the subcutaneous tissue and the artery that had been traced out, was identified as it is very thickened artery. I do not see anything that I can definitely say it is inflammation and then the segment about an inch and a 0.5 inch and segment was removed. It did go to a bifurcation and both of the distal ends were tied with 3- 0 Vicryl.  The  proximal portion after the clamped, was tied with 4-0 Vicryl and then the segment of the artery was removed and sent for permanent pathology exam.  There was a little bleeder that was sutured with the 4-0 Vicryl, and then the cuff with subcutaneous sutures of 3-0 Vicryl and then 5-0 nylon simple sutures on the skin incision.  The patient tolerated procedure nicely.  There was no evidence of any significant bleeding.  A little Neosporin ointment, and 4x4 folded up was placed over the incision.  The patient will resume all of his usual medications and that is insulin, Plavix etc today, and we will hopefully have the results of the biopsy, if not tomorrow on Wednesday.  He has already been started on the prednisone. He is no longer having the headaches and whether this is a temporal arteritis or not, await the results of the pathology examination.     Anselm Pancoast. Zachery Dakins,  M.D.     WJW/MEDQ  D:  02/26/2011  T:  02/26/2011  Job:  [id number]  cc:   Pearline Cables, MD

## 2011-02-26 NOTE — Brief Op Note (Signed)
02/26/2011  8:54 AM  PATIENT:  Justin [name]  76 y.o. male  PRE-OPERATIVE DIAGNOSIS:  Rule out temporal arteritis  POST-OPERATIVE DIAGNOSIS:  Await path report elevated sed rate l sised headach   PROCEDURE:  Procedure(s):left temperal artery biopsy BIOPSY TEMPORAL ARTERY  SURGEON:  Surgeon(s): Iona Coach, MD  PHYSICIAN ASSISTANT: none miminal ASSISTANTS: none   ANESTHESIA:   local  EBL:   miminal  BLOOD ADMINISTERED:none  DRAINS: none   LOCAL MEDICATIONS USED:  NONE  SPECIMEN:  Source of Specimen:  l temperal artery  DISPOSITION OF SPECIMEN:  PATHOLOGY  COUNTS:  YES  TOURNIQUET:  * No tourniquets in log *  DICTATION: .Other Dictation: Dictation Number [id number]  PLAN OF CARE: Discharge to home after PACU  PATIENT DISPOSITION:  PACU - hemodynamically stable.   Delay start of Pharmacological VTE agent (>24hrs) due to surgical blood loss or risk of bleeding:  {YES/NO/NOT APPLICABLE:20182

## 2011-02-27 ENCOUNTER — Encounter (HOSPITAL_BASED_OUTPATIENT_CLINIC_OR_DEPARTMENT_OTHER): Payer: Self-pay

## 2011-03-05 ENCOUNTER — Telehealth (INDEPENDENT_AMBULATORY_CARE_PROVIDER_SITE_OTHER): Payer: Self-pay

## 2011-03-05 NOTE — Telephone Encounter (Signed)
Dr Patsy Lager called to follow up on patient and to see results of biopsy. I read the path results and gave her the follow up appointment date. She said she put him on a heavy prednisone and said he will need to taper off of it. She asked if you guys would let him know, call her if you have questions, or tell him to follow up with Dr Patsy Lager  If she needs to advise him on it.

## 2011-03-06 ENCOUNTER — Ambulatory Visit (INDEPENDENT_AMBULATORY_CARE_PROVIDER_SITE_OTHER): Payer: Medicare Other | Admitting: General Surgery

## 2011-03-06 ENCOUNTER — Encounter (INDEPENDENT_AMBULATORY_CARE_PROVIDER_SITE_OTHER): Payer: Self-pay | Admitting: General Surgery

## 2011-03-06 VITALS — BP 142/80 | HR 70 | Temp 98.2°F | Resp 16 | Ht 72.0 in | Wt 197.0 lb

## 2011-03-06 DIAGNOSIS — R51 Headache: Secondary | ICD-10-CM

## 2011-03-06 NOTE — Progress Notes (Signed)
Patient ID: Justin [name], male   DOB: 1934/05/03, 76 y.o.   MRN: [id number] [name] returns he is now 8 days following a left temporal artery biopsy the biopsy did not show temporal arteritis which is calcified or artery with some calcium deposits in it consistent with his a says he's doing well when the steroids that they placed him on his make him feel better but he saw his medical doctor today and they reduced the steroids to half dose after reviewing the results of the temporal artery biopsy. The incision looks good Robin remove his sutures and he'll see [place] on a p.r.n. basis

## 2011-03-06 NOTE — Telephone Encounter (Signed)
Copy to Dr. Zachery Dakins

## 2011-03-07 ENCOUNTER — Encounter (HOSPITAL_BASED_OUTPATIENT_CLINIC_OR_DEPARTMENT_OTHER): Payer: Self-pay | Admitting: General Surgery

## 2011-03-16 ENCOUNTER — Ambulatory Visit (INDEPENDENT_AMBULATORY_CARE_PROVIDER_SITE_OTHER): Payer: Medicare Other

## 2011-03-16 DIAGNOSIS — J40 Bronchitis, not specified as acute or chronic: Secondary | ICD-10-CM

## 2011-04-15 ENCOUNTER — Ambulatory Visit (INDEPENDENT_AMBULATORY_CARE_PROVIDER_SITE_OTHER): Payer: Medicare Other

## 2011-04-15 DIAGNOSIS — E236 Other disorders of pituitary gland: Secondary | ICD-10-CM

## 2011-04-15 DIAGNOSIS — E119 Type 2 diabetes mellitus without complications: Secondary | ICD-10-CM

## 2011-04-15 DIAGNOSIS — D509 Iron deficiency anemia, unspecified: Secondary | ICD-10-CM

## 2011-04-20 ENCOUNTER — Ambulatory Visit (INDEPENDENT_AMBULATORY_CARE_PROVIDER_SITE_OTHER): Payer: Medicare Other

## 2011-04-20 DIAGNOSIS — R05 Cough: Secondary | ICD-10-CM

## 2011-04-20 DIAGNOSIS — R059 Cough, unspecified: Secondary | ICD-10-CM

## 2011-04-20 DIAGNOSIS — IMO0001 Reserved for inherently not codable concepts without codable children: Secondary | ICD-10-CM

## 2011-04-23 ENCOUNTER — Ambulatory Visit (INDEPENDENT_AMBULATORY_CARE_PROVIDER_SITE_OTHER): Payer: Medicare Other

## 2011-04-23 DIAGNOSIS — R05 Cough: Secondary | ICD-10-CM

## 2011-05-18 ENCOUNTER — Other Ambulatory Visit: Payer: Self-pay | Admitting: Family Medicine

## 2011-05-18 MED ORDER — SIMVASTATIN 40 MG PO TABS: 40.0000 mg | ORAL_TABLET | Freq: Every day | ORAL | Status: DC

## 2011-05-18 MED ORDER — INSULIN GLARGINE 100 UNIT/ML ~~LOC~~ SOLN: 40.0000 [IU] | Freq: Every day | SUBCUTANEOUS | Status: DC

## 2011-05-23 ENCOUNTER — Telehealth: Payer: Self-pay

## 2011-05-23 NOTE — Telephone Encounter (Signed)
DR L PT WOULD LIKE AN ORDER TO GET TESTOSTERONE INJECTION. HE WOULD LIKE TO COME 05/23/10. SAID HE IS SUPPOSED TO COME IN ONCE A MONTH FOR THE INJECTION. DOES HE NEED A STANDING ORDER?

## 2011-05-24 ENCOUNTER — Other Ambulatory Visit: Payer: Self-pay | Admitting: Family Medicine

## 2011-05-24 ENCOUNTER — Encounter: Payer: Self-pay | Admitting: Family Medicine

## 2011-05-24 DIAGNOSIS — E291 Testicular hypofunction: Secondary | ICD-10-CM

## 2011-05-24 MED ORDER — TESTOSTERONE CYPIONATE 100 MG/ML IM SOLN
200.0000 mg | INTRAMUSCULAR | Status: DC
  Administered 2011-09-13: 200 mg via INTRAMUSCULAR

## 2011-05-24 NOTE — Telephone Encounter (Signed)
Dr. Milus Glazier,  Would you like to place a standing order for this patient's testosterone injections?  You can do it from this telephone encounter by opening the encounter, go to meds and orders, order the testosterone.  Select "standing" and indicate the frequency and number of doses (be sure to add some extras in case we accidentally "release" two doses at once!).   Then go to Documentation and document that you've placed the standing order (or not, if that's what you choose). The go to Routing, and route the message to the Clinical Message Pool (p [facility]...) Than, "X" out of the chart to route (don't select "close encounter").  csj

## 2011-05-28 ENCOUNTER — Ambulatory Visit (INDEPENDENT_AMBULATORY_CARE_PROVIDER_SITE_OTHER): Payer: Medicare Other | Admitting: Internal Medicine

## 2011-05-28 VITALS — BP 127/75 | HR 93 | Temp 97.8°F | Resp 18 | Wt 201.0 lb

## 2011-05-28 DIAGNOSIS — J42 Unspecified chronic bronchitis: Secondary | ICD-10-CM

## 2011-05-28 DIAGNOSIS — E119 Type 2 diabetes mellitus without complications: Secondary | ICD-10-CM

## 2011-05-28 DIAGNOSIS — R05 Cough: Secondary | ICD-10-CM

## 2011-05-28 MED ORDER — FLUTICASONE-SALMETEROL 250-50 MCG/DOSE IN AEPB: 1.0000 | INHALATION_SPRAY | Freq: Two times a day (BID) | RESPIRATORY_TRACT | Status: DC

## 2011-05-28 MED ORDER — SITAGLIPTIN PHOSPHATE 100 MG PO TABS: 100.0000 mg | ORAL_TABLET | Freq: Every day | ORAL | Status: DC

## 2011-05-28 MED ORDER — AMOXICILLIN 500 MG PO CAPS: 500.0000 mg | ORAL_CAPSULE | Freq: Three times a day (TID) | ORAL | Status: AC

## 2011-05-28 MED ORDER — HYDROCODONE-HOMATROPINE 5-1.5 MG/5ML PO SYRP: 5.0000 mL | ORAL_SOLUTION | Freq: Every day | ORAL | Status: AC

## 2011-05-28 NOTE — Patient Instructions (Signed)
Use Advair for cough one inhalation twice a day for the next 3-6 months if it is working followup with Dr Milus Glazier by appointment for a review of your other problems We will set up the appt.

## 2011-05-28 NOTE — Progress Notes (Signed)
  Subjective:    Patient ID: Justin [name], male    DOB: [birth year], 77 y.o.   MRN: [id number]  HPI Has a history of recurrent cough. He feels like he responded to his shot of steroids last month. He now has acute onset of cough with sputum for 4 days. His wife says he coughs every night for many months and that she can't sleep because of it. He says he sleeps well. Note his outside records reveal a recent chest x-ray in November 2012 this suggests COPD. He also had pulmonary function studies which were reported as moderate obstruction. There is no history of asthma. He says he is never been on an inhaler. He denies fever night sweats and weight loss.  He has transferred his care from [facility] to Dr. Milus Glazier, and needs a refill of Januvia while here. He reports no complications of his diabetes although most notes in the chart indicate that he is poorly controlled.  Past history-hypogonadism. Own testosterone Hyperlipidemia Low vitamin B12 Hypertension  GERD TIA Peripheral vascular disease Arthritis with elevated sed rate Chronic bronchitis Chronic headaches    Review of Systemsnothing else related to the current complaint     Objective:   Physical Exam Vital signs are normal Tympanic membranes clear, nose is clear, the throat is clear The chest  Has coarse breath sounds but no rales or wheezes The heart is regular without murmurs rubs or gallops There is no peripheral edema Neuro is intact        Assessment & Plan:  Problem #1 chronic cough-COPD with recurrent bronchitis  Begin Advair 250/50 one inhalation twice a day Amoxicillin 500 3 times a day #30  Problem #2 diabetes Refill Januvia 100/continue Lantus  Followup at 104 by appointment

## 2011-06-07 ENCOUNTER — Telehealth: Payer: Self-pay

## 2011-06-07 NOTE — Telephone Encounter (Signed)
Pt's wife is calling asking that a copy of patient records for 04/13/11 be mailed to the home address

## 2011-07-05 ENCOUNTER — Ambulatory Visit (INDEPENDENT_AMBULATORY_CARE_PROVIDER_SITE_OTHER): Payer: Medicare Other | Admitting: Family Medicine

## 2011-07-05 ENCOUNTER — Encounter: Payer: Self-pay | Admitting: Family Medicine

## 2011-07-05 DIAGNOSIS — IMO0001 Reserved for inherently not codable concepts without codable children: Secondary | ICD-10-CM

## 2011-07-05 DIAGNOSIS — I10 Essential (primary) hypertension: Secondary | ICD-10-CM

## 2011-07-05 DIAGNOSIS — B353 Tinea pedis: Secondary | ICD-10-CM

## 2011-07-05 MED ORDER — KETOCONAZOLE 2 % EX CREA: TOPICAL_CREAM | Freq: Every day | CUTANEOUS | Status: AC

## 2011-07-05 MED ORDER — METFORMIN HCL 1000 MG PO TABS: 1000.0000 mg | ORAL_TABLET | Freq: Two times a day (BID) | ORAL | Status: DC

## 2011-07-05 NOTE — Progress Notes (Signed)
77 yo at [employer] and has been unable to do his regular duties for over a year due to accident at work.  He had a fracture of his right leg.  He had jumped off a overturning forklift and he fell under the machine.   He still goes to work daily and does sitting job.  His legs swell every day and ache all the time.  Feels fine now except for the chronic leg pain  Blood sugars are running high:  276 this morning fasting.  Taking Levemir 40 +40 am and pm  O:  Using cane and walks slowly. Feet:  Hammer toes, some white exudates in web spaces bilaterally between 3 and 4, 4 and 5 Heart reg, no murmur Chest clear  Alert, cooperative  A:  Uncontrolled diabetes, tinea pedis, chronic leg pains.  This is one determined man who refuses to give in to his pain.  We should be able to control his sugar better.  He does feel much better with less medicine, so that is something to be thankful for.  Nevertheless, we will make some changes and follow up before June  P:  Increase Levemir to 50 + 40 Add metformin 1000 at hs. Ketoconazole to feet qd  Recheck 6 weeks

## 2011-07-12 ENCOUNTER — Telehealth: Payer: Self-pay

## 2011-07-12 MED ORDER — CLOPIDOGREL BISULFATE 75 MG PO TABS: 75.0000 mg | ORAL_TABLET | Freq: Every day | ORAL | Status: DC

## 2011-07-12 NOTE — Telephone Encounter (Signed)
Done

## 2011-07-12 NOTE — Telephone Encounter (Signed)
Pharmacy calling to request rx for generic plavix if possible for this pt [facility] [phone] contact is deadra

## 2011-08-09 ENCOUNTER — Encounter: Payer: Self-pay | Admitting: Family Medicine

## 2011-08-09 ENCOUNTER — Ambulatory Visit (INDEPENDENT_AMBULATORY_CARE_PROVIDER_SITE_OTHER): Payer: Medicare Other | Admitting: Family Medicine

## 2011-08-09 DIAGNOSIS — E291 Testicular hypofunction: Secondary | ICD-10-CM

## 2011-08-09 DIAGNOSIS — M79609 Pain in unspecified limb: Secondary | ICD-10-CM

## 2011-08-09 DIAGNOSIS — R600 Localized edema: Secondary | ICD-10-CM

## 2011-08-09 DIAGNOSIS — M79673 Pain in unspecified foot: Secondary | ICD-10-CM

## 2011-08-09 DIAGNOSIS — E119 Type 2 diabetes mellitus without complications: Secondary | ICD-10-CM

## 2011-08-09 DIAGNOSIS — R609 Edema, unspecified: Secondary | ICD-10-CM

## 2011-08-09 LAB — COMPREHENSIVE METABOLIC PANEL
ALT: 12 U/L (ref 0–53)
AST: 18 U/L (ref 0–37)
Albumin: 3.8 g/dL (ref 3.5–5.2)
Alkaline Phosphatase: 71 U/L (ref 39–117)
BUN: 17 mg/dL (ref 6–23)
CO2: 25 mEq/L (ref 19–32)
Calcium: 9.2 mg/dL (ref 8.4–10.5)
Chloride: 102 mEq/L (ref 96–112)
Creat: 0.93 mg/dL (ref 0.50–1.35)
Glucose, Bld: 209 mg/dL — ABNORMAL HIGH (ref 70–99)
Potassium: 4.4 mEq/L (ref 3.5–5.3)
Sodium: 136 mEq/L (ref 135–145)
Total Bilirubin: 0.6 mg/dL (ref 0.3–1.2)
Total Protein: 7.5 g/dL (ref 6.0–8.3)

## 2011-08-09 LAB — POCT GLYCOSYLATED HEMOGLOBIN (HGB A1C): Hemoglobin A1C: 9.1

## 2011-08-09 MED ORDER — INSULIN DETEMIR 100 UNIT/ML ~~LOC~~ SOLN: 40.0000 [IU] | Freq: Two times a day (BID) | SUBCUTANEOUS | Status: DC

## 2011-08-09 MED ORDER — TESTOSTERONE CYPIONATE 200 MG/ML IM SOLN
200.0000 mg | INTRAMUSCULAR | Status: DC
  Administered 2011-08-09: 200 mg via INTRAMUSCULAR

## 2011-08-09 MED ORDER — FUROSEMIDE 20 MG PO TABS: 20.0000 mg | ORAL_TABLET | Freq: Every day | ORAL | Status: DC

## 2011-08-09 MED ORDER — AMITRIPTYLINE HCL 25 MG PO TABS: 25.0000 mg | ORAL_TABLET | Freq: Every day | ORAL | Status: DC

## 2011-08-09 MED ORDER — INSULIN GLARGINE 100 UNIT/ML ~~LOC~~ SOLN: 30.0000 [IU] | Freq: Every day | SUBCUTANEOUS | Status: DC

## 2011-08-09 NOTE — Progress Notes (Signed)
S:  77 yo who suffered injury 1 year ago. Still working at [employer], running errands.   He continues to suffer pain and swelling lower extremities.  Diabetes:  FBS around 90-110,  BS about 225 to 230 in the afternoon.;  Eating vegetables.  No recent eye check up but will be going to [place] Rx:  Metformin, Januvia, Levimer 40 + 40  O:  NAD Foot exam:  Good DP  Obvious past medial achilles scars  Good sensory with filament testing  1+ testing  Hammer toes Eyes: normal fundi Oroph:  Edentulous lower, about 6 teeth remain upper Neck:  No bruits, no adenop or thyromeg Chest:  Clear Heart: reg, no murmur  A:  Diabetes with reasonable control  P: Decrease Lantus to 30 bid Start Elavil 25 qhs Discontinue the gabapentin CMET, A1C, microalbumin today Lasix 20 qam prn Follow up 1 month  Testosterone shot 200 mg (1 ml) today

## 2011-08-10 LAB — MICROALBUMIN, URINE: Microalb, Ur: 48.59 mg/dL — ABNORMAL HIGH (ref 0.00–1.89)

## 2011-08-22 ENCOUNTER — Telehealth: Payer: Self-pay

## 2011-08-22 DIAGNOSIS — E119 Type 2 diabetes mellitus without complications: Secondary | ICD-10-CM

## 2011-08-22 NOTE — Telephone Encounter (Signed)
[facility] [address] [facility] [place]

## 2011-08-23 MED ORDER — SITAGLIPTIN PHOSPHATE 100 MG PO TABS: 100.0000 mg | ORAL_TABLET | Freq: Every day | ORAL | Status: DC

## 2011-08-23 NOTE — Telephone Encounter (Signed)
Done

## 2011-09-13 ENCOUNTER — Other Ambulatory Visit: Payer: Self-pay | Admitting: Physician Assistant

## 2011-09-13 ENCOUNTER — Ambulatory Visit (INDEPENDENT_AMBULATORY_CARE_PROVIDER_SITE_OTHER): Payer: Medicare Other | Admitting: Family Medicine

## 2011-09-13 ENCOUNTER — Encounter: Payer: Self-pay | Admitting: Family Medicine

## 2011-09-13 VITALS — BP 137/77 | HR 83 | Temp 98.0°F | Resp 20 | Ht 69.0 in | Wt 212.8 lb

## 2011-09-13 DIAGNOSIS — E236 Other disorders of pituitary gland: Secondary | ICD-10-CM

## 2011-09-13 DIAGNOSIS — E291 Testicular hypofunction: Secondary | ICD-10-CM

## 2011-09-13 DIAGNOSIS — N400 Enlarged prostate without lower urinary tract symptoms: Secondary | ICD-10-CM

## 2011-09-13 LAB — COMPREHENSIVE METABOLIC PANEL
ALT: 13 U/L (ref 0–53)
AST: 16 U/L (ref 0–37)
Albumin: 4 g/dL (ref 3.5–5.2)
Alkaline Phosphatase: 75 U/L (ref 39–117)
BUN: 14 mg/dL (ref 6–23)
CO2: 25 mEq/L (ref 19–32)
Calcium: 9.3 mg/dL (ref 8.4–10.5)
Chloride: 100 mEq/L (ref 96–112)
Creat: 0.86 mg/dL (ref 0.50–1.35)
Glucose, Bld: 170 mg/dL — ABNORMAL HIGH (ref 70–99)
Potassium: 4.2 mEq/L (ref 3.5–5.3)
Sodium: 135 mEq/L (ref 135–145)
Total Bilirubin: 0.7 mg/dL (ref 0.3–1.2)
Total Protein: 7.6 g/dL (ref 6.0–8.3)

## 2011-09-13 LAB — CBC
HCT: 37.7 % — ABNORMAL LOW (ref 39.0–52.0)
Hemoglobin: 12.5 g/dL — ABNORMAL LOW (ref 13.0–17.0)
MCH: 26.3 pg (ref 26.0–34.0)
MCHC: 33.2 g/dL (ref 30.0–36.0)
MCV: 79.4 fL (ref 78.0–100.0)
Platelets: 356 10*3/uL (ref 150–400)
RBC: 4.75 MIL/uL (ref 4.22–5.81)
RDW: 16 % — ABNORMAL HIGH (ref 11.5–15.5)
WBC: 8.2 10*3/uL (ref 4.0–10.5)

## 2011-09-13 LAB — TESTOSTERONE: Testosterone: 261.53 ng/dL — ABNORMAL LOW (ref 300–890)

## 2011-09-13 NOTE — Progress Notes (Signed)
This is a 77 year old gentleman has been on testosterone replacement for 5 months. He says he never felt so good. He gets up at 4:30 in the morning, his breakfast with coffee and goes to work. Recently she's been taking apart his back and replacing image pieces. He has more energy and has his general sense of well-being that he did not have a year ago.  Also complains about his knees but is able to deal with it. He has no chest pain, shortness of breath, swelling in his ankles or headaches.  Objective: Patient seems to be happy is alert and cooperative HEENT: Unremarkable Chest: Clear Heart: Regular, no murmur or gallop Extremities: No edema, no muscle wasting  Assessment: Male hypogonadism adequately treated  Plan: Check usual labs 1 on testosterone replacement, continue 200 mg testosterone every month, recheck 6 months

## 2011-09-15 LAB — PSA, MEDICARE: PSA: 1 ng/mL (ref <=4.00)

## 2011-10-20 ENCOUNTER — Ambulatory Visit: Payer: Medicare Other

## 2011-10-20 ENCOUNTER — Ambulatory Visit (INDEPENDENT_AMBULATORY_CARE_PROVIDER_SITE_OTHER): Payer: Medicare Other | Admitting: Family Medicine

## 2011-10-20 VITALS — BP 134/60 | HR 91 | Temp 97.9°F | Resp 16 | Ht 68.0 in | Wt 206.0 lb

## 2011-10-20 DIAGNOSIS — R5383 Other fatigue: Secondary | ICD-10-CM

## 2011-10-20 DIAGNOSIS — E349 Endocrine disorder, unspecified: Secondary | ICD-10-CM

## 2011-10-20 DIAGNOSIS — R0989 Other specified symptoms and signs involving the circulatory and respiratory systems: Secondary | ICD-10-CM

## 2011-10-20 DIAGNOSIS — J069 Acute upper respiratory infection, unspecified: Secondary | ICD-10-CM

## 2011-10-20 DIAGNOSIS — R5381 Other malaise: Secondary | ICD-10-CM

## 2011-10-20 DIAGNOSIS — R222 Localized swelling, mass and lump, trunk: Secondary | ICD-10-CM

## 2011-10-20 DIAGNOSIS — R05 Cough: Secondary | ICD-10-CM

## 2011-10-20 DIAGNOSIS — R059 Cough, unspecified: Secondary | ICD-10-CM

## 2011-10-20 LAB — POCT CBC
Granulocyte percent: 63.5 % (ref 37–80)
HCT, POC: 43.7 % (ref 43.5–53.7)
Hemoglobin: 13.5 g/dL — AB (ref 14.1–18.1)
Lymph, poc: 2 (ref 0.6–3.4)
MCH, POC: 26.3 pg — AB (ref 27–31.2)
MCHC: 30.9 g/dL — AB (ref 31.8–35.4)
MCV: 85 fL (ref 80–97)
MID (cbc): 0.8 (ref 0–0.9)
MPV: 9.3 fL (ref 0–99.8)
POC Granulocyte: 4.8 (ref 2–6.9)
POC LYMPH PERCENT: 26.4 % (ref 10–50)
POC MID %: 10.1 %M (ref 0–12)
Platelet Count, POC: 357 10*3/uL (ref 142–424)
RBC: 5.14 M/uL (ref 4.69–6.13)
RDW, POC: 16.5 %
WBC: 7.6 10*3/uL (ref 4.6–10.2)

## 2011-10-20 IMAGING — CR DG CHEST 2V
2 series · 2 of 2 positions shown · non-contrast
Comparison: None.

CLINICAL DATA: Cough.  Fatigue.  Chest congestion.

CHEST - 2 VIEW

[PA]
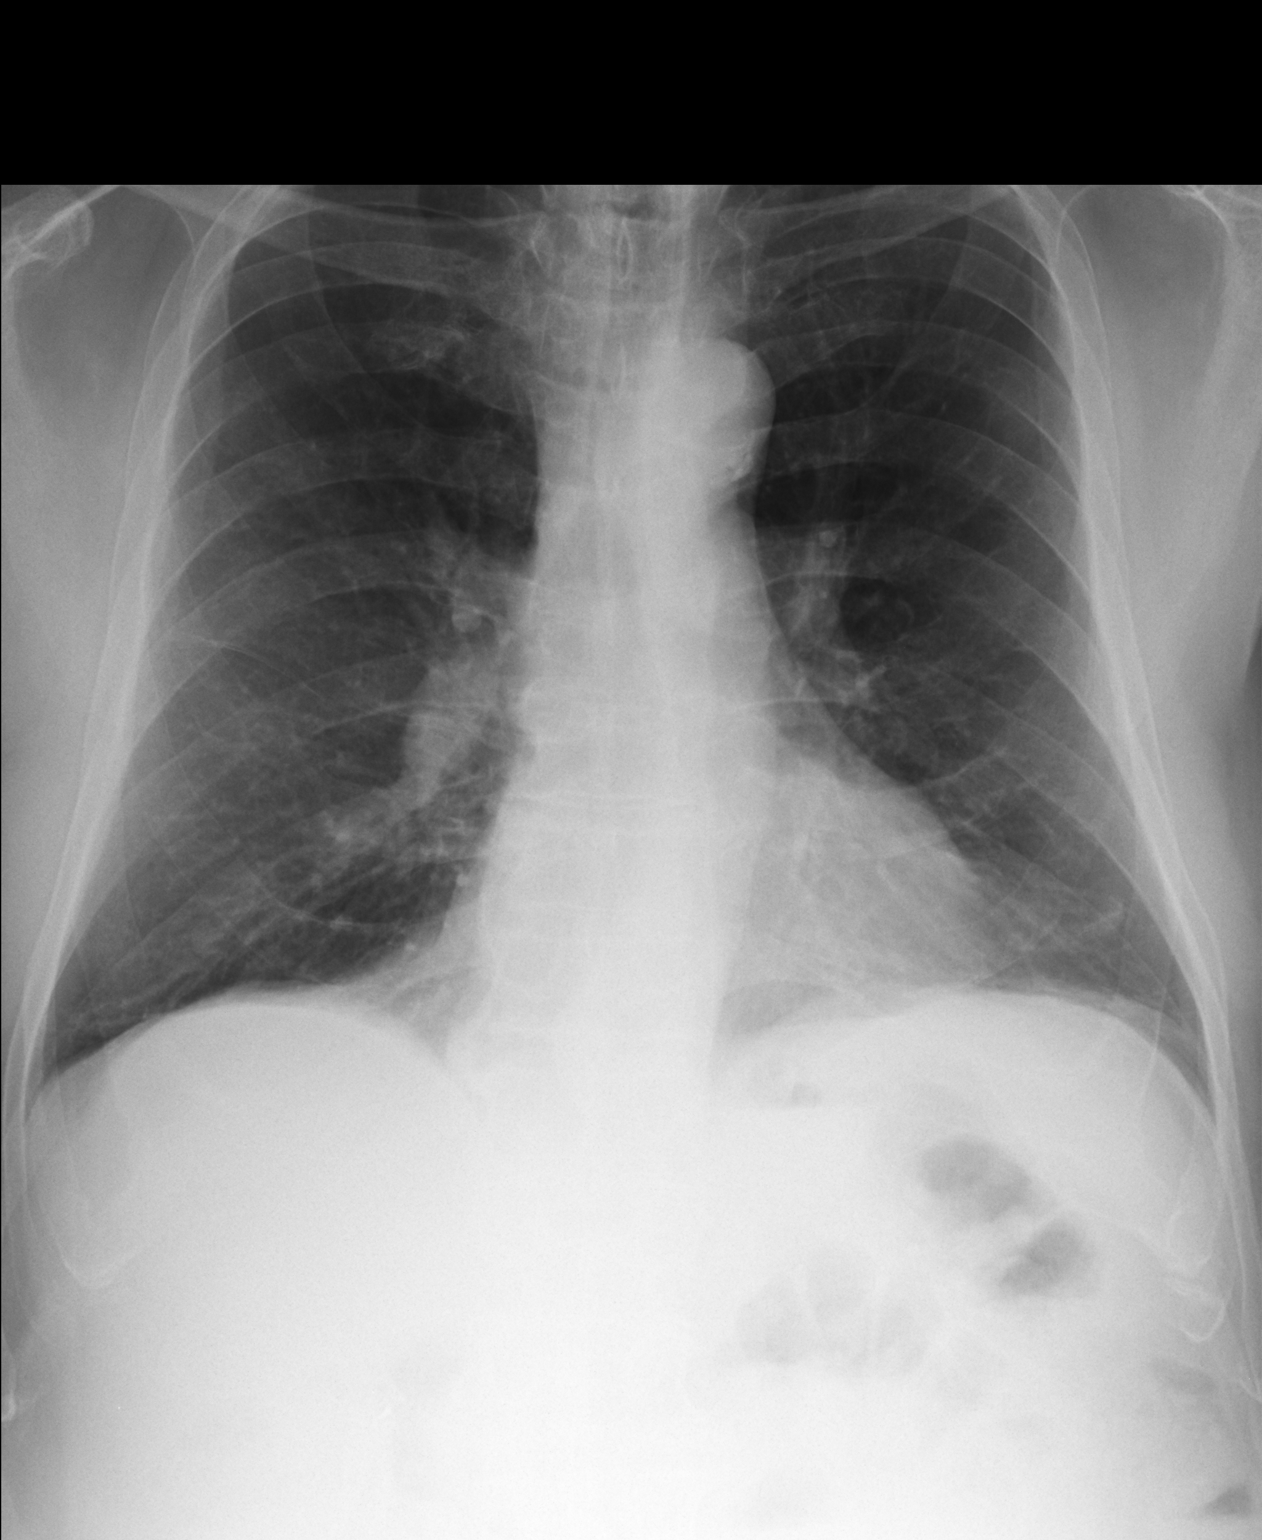

[lateral]
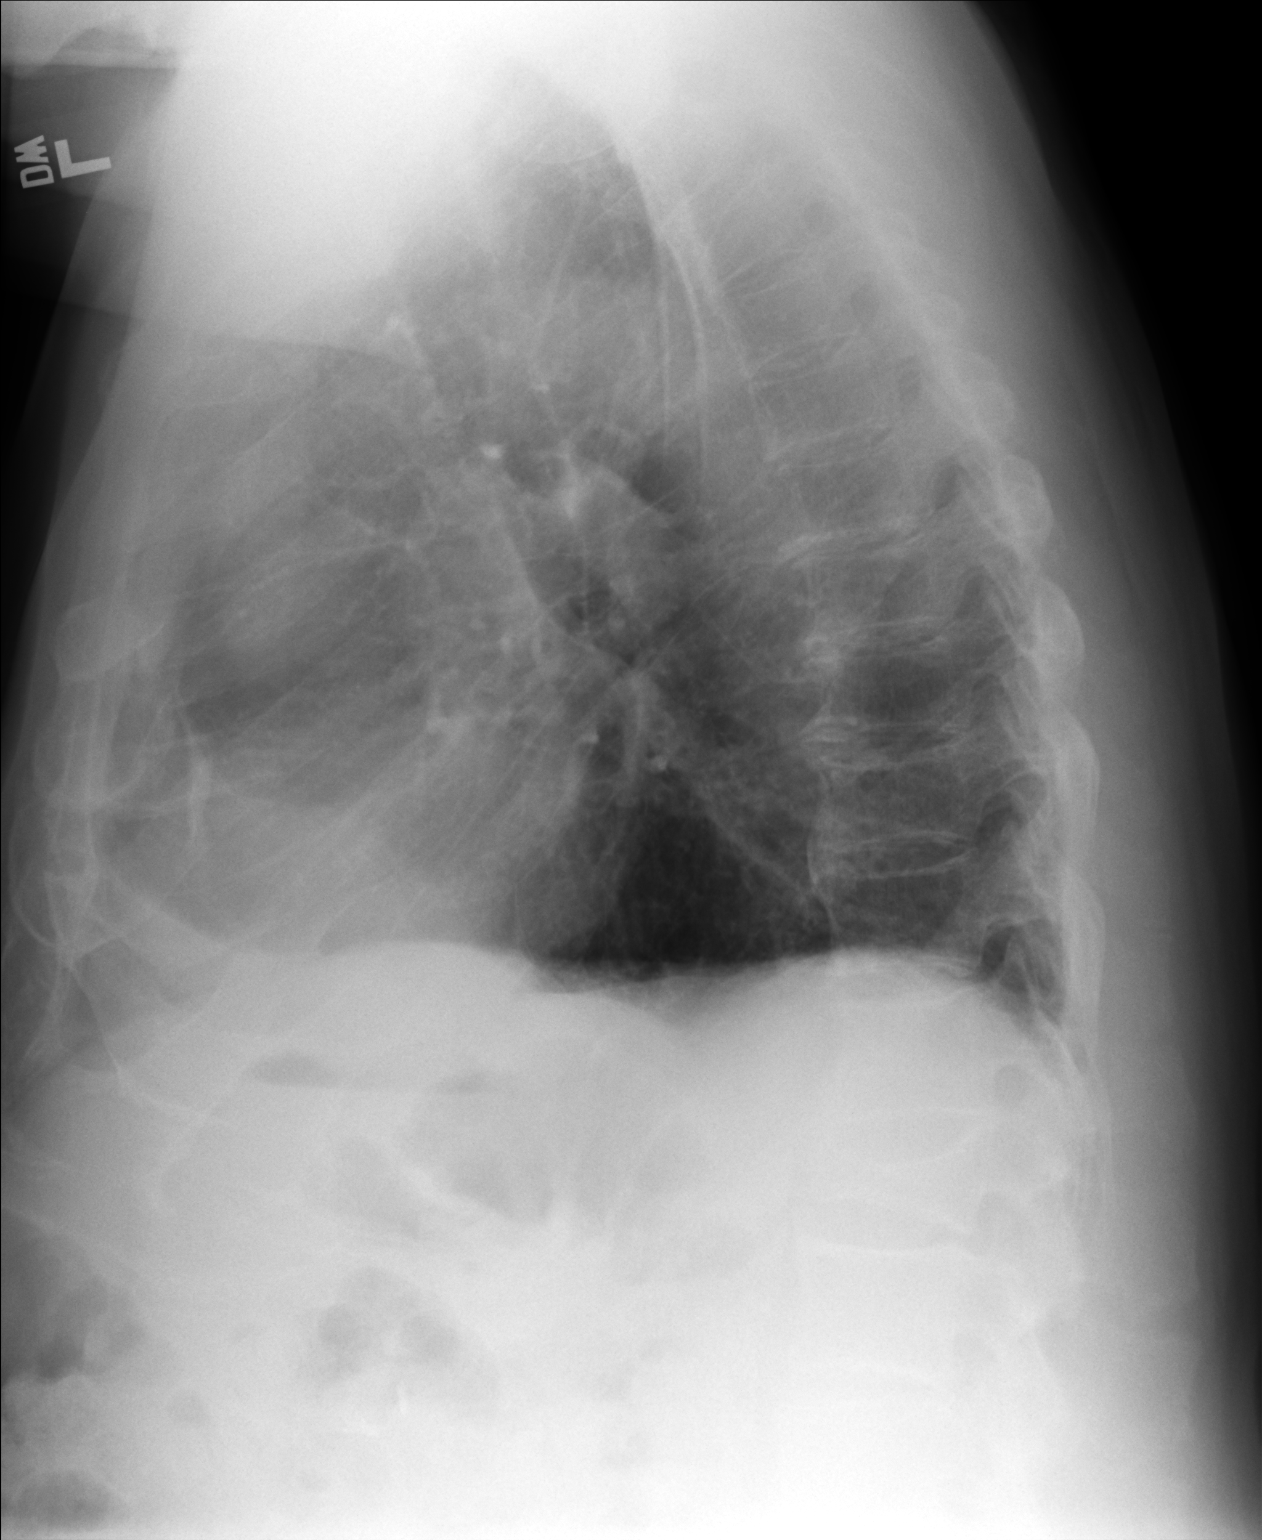

[2 of 2 positions shown; findings below may reference images not displayed]

FINDINGS: The heart size and mediastinal contours are within
normal limits.  Both lungs are clear.  No mass or lymphadenopathy
identified.

Mild mid and lower thoracic spine degenerative disc disease noted.
An old compression fracture of the L1 vertebral body is
demonstrated.
IMPRESSION: 1.  No active cardiopulmonary disease.
2.  Old L1 vertebral body compression fracture and thoracic spine
degenerative disc disease.

## 2011-10-20 MED ORDER — TESTOSTERONE CYPIONATE 100 MG/ML IM SOLN
200.0000 mg | Freq: Once | INTRAMUSCULAR | Status: AC
  Administered 2011-10-20: 200 mg via INTRAMUSCULAR

## 2011-10-20 MED ORDER — AZITHROMYCIN 250 MG PO TABS: ORAL_TABLET | ORAL | Status: AC

## 2011-10-20 NOTE — Progress Notes (Signed)
[facility]:  Office Visit  Chief Complaint:  Chief Complaint  Patient presents with  . Fatigue    2-3 days  . Cough    HPI: Justin [name] is a 77 y.o. male who complains of: 2-3 day history of productive cough, "feels it in his chest", denies SOB/CP, admits to fatigue. 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 Feels fatigued after work. UTD on flu vaccine, pneumonia vaccine. Denies fevers, chills, nausea, vomitus, abd pain.  Tried OTC meds for cough Has not tried any medications except cough medication H/o anemia, testosterone dificiency Denies , CP, SOB, lower extremity edema, swelling, frothy sputum  Past Medical History  Diagnosis Date  . HTN (hypertension)   . Diabetes mellitus   . Hypercholesteremia   . TIA (transient ischemic attack)   . Iron deficiency   . Arthritis   . Stroke    Past Surgical History  Procedure Date  . Back surgery 1985  . Neck surgery 1985  . Temporal artery biopsy / ligation 02/26/2011    left side  . Artery biopsy 02/26/2011    Procedure: BIOPSY TEMPORAL ARTERY;  Surgeon: Iona Coach, MD;  Location: [facility];  Service: General;  Laterality: Left;  Left temporal artery biospy   History   Social History  . Marital Status: Married    Spouse Name: N/A    Number of Children: N/A  . Years of Education: N/A   Social History Main Topics  . Smoking status: Former Smoker -- 35 years    Types: Cigarettes    Quit date: 04/09/1990  . Smokeless tobacco: Never Used  . Alcohol Use: No     "per pt stopped drinking in the 90's"  . Drug Use: No  . Sexually Active: None   Other Topics Concern  . None   Social History Narrative  . None   Family History  Problem Relation Age of Onset  . Heart disease Mother    No Known Allergies Prior to Admission medications   Medication Sig Start Date End Date Taking? Authorizing Provider  amitriptyline  (ELAVIL) 25 MG tablet Take 1 tablet (25 mg total) by mouth at bedtime. 08/09/11 08/08/12 Yes Elvina Sidle, MD  clopidogrel (PLAVIX) 75 MG tablet TAKE ONE TABLET BY MOUTH EVERY DAY 09/13/11  Yes Marzella Schlein McClung, PA-C  insulin glargine (LANTUS SOLOSTAR) 100 UNIT/ML injection Inject 30 Units into the skin at bedtime. 08/09/11 08/08/12 Yes Elvina Sidle, MD  ketoconazole (NIZORAL) 2 % cream Apply topically daily. 07/05/11 07/04/12 Yes Elvina Sidle, MD  l-methylfolate-B6-B12 [facility]) 3-35-2 MG TABS Take 1 tablet by mouth daily.     Yes Historical Provider, MD  metFORMIN (GLUCOPHAGE) 1000 MG tablet Take 1 tablet (1,000 mg total) by mouth 2 (two) times daily with a meal. 07/05/11 07/04/12 Yes Elvina Sidle, MD  Multiple Vitamins-Minerals (MULTIVITAMIN WITH MINERALS) tablet Take 1 tablet by mouth daily.     Yes Historical Provider, MD  omeprazole (PRILOSEC) 20 MG capsule Take 20 mg by mouth daily.     Yes Historical Provider, MD  sitaGLIPtin (JANUVIA) 100 MG tablet Take 1 tablet (100 mg total) by mouth daily. 08/23/11 08/22/12 Yes Peyton Najjar, MD  triamterene-hydrochlorothiazide (MAXZIDE-25) 37.5-25 MG per tablet Take 1 tablet by mouth daily.     Yes Historical Provider, MD  Vitamin D, Ergocalciferol, (DRISDOL) 50000 UNITS CAPS Take 50,000 Units by mouth.     Yes  Historical Provider, MD  fish oil-omega-3 fatty acids 1000 MG capsule Take 2 g by mouth daily.      Historical Provider, MD  Fluticasone-Salmeterol (ADVAIR DISKUS) 250-50 MCG/DOSE AEPB Inhale 1 puff into the lungs 2 (two) times daily. 05/28/11   Tonye Pearson, MD  furosemide (LASIX) 20 MG tablet Take 1 tablet (20 mg total) by mouth daily. 08/09/11 08/08/12  Elvina Sidle, MD     ROS: The patient denies fevers, chills, night sweats, unintentional weight loss, chest pain, palpitations, wheezing, dyspnea on exertion, nausea, vomiting, abdominal pain, dysuria, hematuria, melena, numbness, weakness, or tingling. + fatigue  All other systems have been  reviewed and were otherwise negative with the exception of those mentioned in the HPI and as above.    PHYSICAL EXAM: Filed Vitals:   10/20/11 0800  BP: 134/60  Pulse: 91  Temp: 97.9 F (36.6 C)  Resp: 16   Filed Vitals:   10/20/11 0800  Height: 5\' 8"  (1.727 m)  Weight: 206 lb (93.441 kg)   Body mass index is 31.32 kg/(m^2).  General: Alert, no acute distress HEENT:  Normocephalic, atraumatic, oropharynx patent. Tm nl, EOMI, PERRLA Cardiovascular:  Regular rate and rhythm, no rubs murmurs or gallops.  No Carotid bruits, radial pulse intact. No pedal edema.  Respiratory: Clear to auscultation bilaterally.  No wheezes, rales, or rhonchi.  No cyanosis, no use of accessory musculature GI: No organomegaly, abdomen is soft and non-tender, positive bowel sounds.  No masses. Skin: No rashes. Neurologic: Facial musculature symmetric. Psychiatric: Patient is appropriate throughout our interaction. Lymphatic: No cervical lymphadenopathy Musculoskeletal: Gait intact.   LABS: Results for orders placed in visit on 10/20/11  POCT CBC      Component Value Range   WBC 7.6  4.6 - 10.2 K/uL   Lymph, poc 2.0  0.6 - 3.4   POC LYMPH PERCENT 26.4  10 - 50 %L   MID (cbc) 0.8  0 - 0.9   POC MID % 10.1  0 - 12 %M   POC Granulocyte 4.8  2 - 6.9   Granulocyte percent 63.5  37 - 80 %G   RBC 5.14  4.69 - 6.13 M/uL   Hemoglobin 13.5 (*) 14.1 - 18.1 g/dL   HCT, POC 16.1  09.6 - 53.7 %   MCV 85.0  80 - 97 fL   MCH, POC 26.3 (*) 27 - 31.2 pg   MCHC 30.9 (*) 31.8 - 35.4 g/dL   RDW, POC 04.5     Platelet Count, POC 357  142 - 424 K/uL   MPV 9.3  0 - 99.8 fL     EKG/XRAY:   Primary read interpreted by Dr. Conley Rolls at [facility]. No pneumo, no effusion, ? Right hilar opacity vs nl pulmonary vasculature  ASSESSMENT/PLAN: Encounter Diagnoses  Name Primary?  . Cough Yes  . Fatigue   . Chest congestion   . Testosterone deficiency   . URI (upper respiratory infection)    Rx: z-pack Continue with  inhaler Continue with cough medicine F/u if no improvement    LE, THAO PHUONG, DO 10/20/2011 8:56 AM

## 2011-10-22 DIAGNOSIS — E349 Endocrine disorder, unspecified: Secondary | ICD-10-CM | POA: Insufficient documentation

## 2011-10-24 ENCOUNTER — Other Ambulatory Visit: Payer: Self-pay | Admitting: Physician Assistant

## 2011-11-09 ENCOUNTER — Other Ambulatory Visit: Payer: Self-pay | Admitting: Physician Assistant

## 2011-11-24 ENCOUNTER — Other Ambulatory Visit: Payer: Self-pay | Admitting: Family Medicine

## 2011-12-18 ENCOUNTER — Other Ambulatory Visit: Payer: Self-pay | Admitting: Physician Assistant

## 2011-12-18 NOTE — Telephone Encounter (Signed)
Please call patient and have them call their cardiologist to see if they are ok with them being on Plavix and Prilosec.

## 2011-12-19 NOTE — Telephone Encounter (Signed)
Ok through December?

## 2011-12-20 NOTE — Telephone Encounter (Signed)
Cardiologist should be doing this.

## 2011-12-22 ENCOUNTER — Telehealth: Payer: Self-pay

## 2011-12-22 NOTE — Telephone Encounter (Signed)
PATIENT HAS CALLED SAYING ITS BEEN 4 DAYS WAITING FOR RX REFILL FROM [facility]. [facility] WILL NOT REFILL RX BECAUSE THEY ARE WAITING FOR [facility] TO RESPOND BACK. PATIENT IS UPSET AND SAYS HE NEEDS HIS MEDICATION AND THAT HE SHOULD NOT HAVE TO COME IN TO BE SEEN BECAUSE PATIENT STATES "DR.[facility] ALWAYS FILL RX WITHOUT HIM COMING INTO [facility]".

## 2011-12-22 NOTE — Telephone Encounter (Signed)
In previous message from Maralyn Sago she stated her cardiologist should refill this med.

## 2011-12-23 NOTE — Telephone Encounter (Signed)
LMOM to CB. 

## 2011-12-24 ENCOUNTER — Other Ambulatory Visit: Payer: Self-pay | Admitting: Family Medicine

## 2011-12-24 DIAGNOSIS — I2510 Atherosclerotic heart disease of native coronary artery without angina pectoris: Secondary | ICD-10-CM

## 2011-12-24 MED ORDER — CLOPIDOGREL BISULFATE 75 MG PO TABS: 75.0000 mg | ORAL_TABLET | Freq: Every day | ORAL | Status: DC

## 2011-12-24 NOTE — Telephone Encounter (Signed)
Spoke w/pt who reports that he doesn't have a cardiologist that he sees regularly. Stated that Dr Elbert Ewings manages these medications for him and he doesn't think that he is due for f/up visit for 3 mos. Dr L, do you want to RF pt's Plavix, and is it fine for pt to be on that along w/the prilosec? Pt states that he is out of meds so he would appreciate it if we could RF today and then let him know.

## 2011-12-24 NOTE — Telephone Encounter (Signed)
Dr L sent in RFs of pt's Plavix and I notified pt that it was sent to [facility]. Pt agreed to f/up w/ Dr L as instr'd.

## 2011-12-26 ENCOUNTER — Other Ambulatory Visit: Payer: Self-pay | Admitting: Physician Assistant

## 2012-01-09 ENCOUNTER — Other Ambulatory Visit: Payer: Self-pay

## 2012-01-09 MED ORDER — SITAGLIPTIN PHOSPHATE 100 MG PO TABS: 100.0000 mg | ORAL_TABLET | Freq: Every day | ORAL | Status: DC

## 2012-01-17 ENCOUNTER — Ambulatory Visit (INDEPENDENT_AMBULATORY_CARE_PROVIDER_SITE_OTHER): Payer: Medicare Other | Admitting: Family Medicine

## 2012-01-17 ENCOUNTER — Encounter: Payer: Self-pay | Admitting: Family Medicine

## 2012-01-17 VITALS — BP 142/68 | HR 90 | Temp 98.2°F | Resp 18 | Ht 68.0 in | Wt 209.0 lb

## 2012-01-17 DIAGNOSIS — G8929 Other chronic pain: Secondary | ICD-10-CM

## 2012-01-17 DIAGNOSIS — M25559 Pain in unspecified hip: Secondary | ICD-10-CM

## 2012-01-17 DIAGNOSIS — I2510 Atherosclerotic heart disease of native coronary artery without angina pectoris: Secondary | ICD-10-CM

## 2012-01-17 DIAGNOSIS — M79673 Pain in unspecified foot: Secondary | ICD-10-CM

## 2012-01-17 DIAGNOSIS — M79609 Pain in unspecified limb: Secondary | ICD-10-CM

## 2012-01-17 DIAGNOSIS — I10 Essential (primary) hypertension: Secondary | ICD-10-CM

## 2012-01-17 DIAGNOSIS — E119 Type 2 diabetes mellitus without complications: Secondary | ICD-10-CM

## 2012-01-17 DIAGNOSIS — K219 Gastro-esophageal reflux disease without esophagitis: Secondary | ICD-10-CM

## 2012-01-17 LAB — POCT GLYCOSYLATED HEMOGLOBIN (HGB A1C): Hemoglobin A1C: 9.2

## 2012-01-17 MED ORDER — INSULIN GLARGINE 100 UNIT/ML ~~LOC~~ SOLN: 30.0000 [IU] | Freq: Every day | SUBCUTANEOUS | Status: DC

## 2012-01-17 MED ORDER — TRIAMTERENE-HCTZ 37.5-25 MG PO TABS: 1.0000 | ORAL_TABLET | Freq: Every day | ORAL | Status: DC

## 2012-01-17 MED ORDER — METFORMIN HCL 1000 MG PO TABS: 1000.0000 mg | ORAL_TABLET | Freq: Two times a day (BID) | ORAL | Status: DC

## 2012-01-17 MED ORDER — CLOPIDOGREL BISULFATE 75 MG PO TABS: 75.0000 mg | ORAL_TABLET | Freq: Every day | ORAL | Status: DC

## 2012-01-17 MED ORDER — SITAGLIPTIN PHOSPHATE 100 MG PO TABS: 100.0000 mg | ORAL_TABLET | Freq: Every day | ORAL | Status: DC

## 2012-01-17 MED ORDER — OMEPRAZOLE 20 MG PO CPDR: 20.0000 mg | DELAYED_RELEASE_CAPSULE | Freq: Every day | ORAL | Status: DC

## 2012-01-17 MED ORDER — PREDNISONE 20 MG PO TABS: ORAL_TABLET | ORAL | Status: DC

## 2012-01-17 MED ORDER — AMITRIPTYLINE HCL 25 MG PO TABS: 25.0000 mg | ORAL_TABLET | Freq: Every day | ORAL | Status: DC

## 2012-01-17 NOTE — Progress Notes (Signed)
77 yo man who was injured at work 18 months ago.  He was in the yard and a forklift tipped over and as he scrambled to get away, the forklift landed over his legs.  He continues to have pain around the ankles and lower legs that radiates up and down left leg.  The left leg gives out on him sometimes.  He has benefited symptomatically from prednisone in the past.   He left his testosterone medication at home The blood sugar in am is mid 100's, but was 270 last night.  Still going to work every day, doing light duty.   Objective: Patient seems to be happy is alert and cooperative  HEENT: Unremarkable except for cataracts. Neck:  No bruits or thyromegaly Chest: Clear  Heart: Regular, no murmur or gallop  Extremities: Mild swelling just above ankles, no muscle wasting, SLR neg.  FROM both knees and ankles, but he has pain with moving the ankles  Normal sensation both feet.  No skin breaks or calluses  Assessment:  Arthralgias, left sciatica from work injury last year  Plan: prednisone x 5 days 1. Chronic arthralgias of knees and hips  predniSONE (DELTASONE) 20 MG tablet  2. Foot pain  amitriptyline (ELAVIL) 25 MG tablet  3. ASCVD (arteriosclerotic cardiovascular disease)  clopidogrel (PLAVIX) 75 MG tablet  4. Diabetes  POCT glycosylated hemoglobin (Hb A1C), Comprehensive metabolic panel, insulin glargine (LANTUS SOLOSTAR) 100 UNIT/ML injection, metFORMIN (GLUCOPHAGE) 1000 MG tablet, sitaGLIPtin (JANUVIA) 100 MG tablet  5. Hypertension  triamterene-hydrochlorothiazide (MAXZIDE-25) 37.5-25 MG per tablet  6. GERD (gastroesophageal reflux disease)  omeprazole (PRILOSEC) 20 MG capsule

## 2012-01-18 ENCOUNTER — Telehealth: Payer: Self-pay | Admitting: Radiology

## 2012-01-18 LAB — COMPREHENSIVE METABOLIC PANEL
ALT: 12 U/L (ref 0–53)
AST: 16 U/L (ref 0–37)
Albumin: 3.7 g/dL (ref 3.5–5.2)
Alkaline Phosphatase: 73 U/L (ref 39–117)
BUN: 13 mg/dL (ref 6–23)
CO2: 26 mEq/L (ref 19–32)
Calcium: 9.1 mg/dL (ref 8.4–10.5)
Chloride: 101 mEq/L (ref 96–112)
Creat: 0.87 mg/dL (ref 0.50–1.35)
Glucose, Bld: 113 mg/dL — ABNORMAL HIGH (ref 70–99)
Potassium: 4.4 mEq/L (ref 3.5–5.3)
Sodium: 135 mEq/L (ref 135–145)
Total Bilirubin: 0.6 mg/dL (ref 0.3–1.2)
Total Protein: 7.6 g/dL (ref 6.0–8.3)

## 2012-01-18 NOTE — Telephone Encounter (Signed)
Please call in lancets #100 for qd diabetes testing, RF x 1 year

## 2012-01-18 NOTE — Telephone Encounter (Signed)
Dr Milus Glazier: Patient needs more insulin lancets.

## 2012-01-18 NOTE — Telephone Encounter (Signed)
Message copied by Sheldon Silvan on Fri Jan 18, 2012  5:43 PM ------      Message from: Elvina Sidle      Created: Fri Jan 18, 2012 11:40 AM       Please inform patient of normal result

## 2012-01-18 NOTE — Telephone Encounter (Signed)
Please send to the correct pool next time.

## 2012-01-19 MED ORDER — LANCETS MISC: Status: DC

## 2012-01-19 NOTE — Telephone Encounter (Signed)
Sent lancets in

## 2012-01-23 ENCOUNTER — Other Ambulatory Visit: Payer: Self-pay | Admitting: Physician Assistant

## 2012-01-26 ENCOUNTER — Ambulatory Visit: Payer: Medicare Other

## 2012-01-26 ENCOUNTER — Ambulatory Visit (INDEPENDENT_AMBULATORY_CARE_PROVIDER_SITE_OTHER): Payer: Medicare Other | Admitting: Family Medicine

## 2012-01-26 VITALS — BP 155/70 | HR 100 | Temp 98.0°F | Resp 17 | Ht 69.5 in | Wt 205.0 lb

## 2012-01-26 DIAGNOSIS — E119 Type 2 diabetes mellitus without complications: Secondary | ICD-10-CM

## 2012-01-26 DIAGNOSIS — B3781 Candidal esophagitis: Secondary | ICD-10-CM

## 2012-01-26 DIAGNOSIS — B370 Candidal stomatitis: Secondary | ICD-10-CM

## 2012-01-26 DIAGNOSIS — E291 Testicular hypofunction: Secondary | ICD-10-CM

## 2012-01-26 MED ORDER — FLUCONAZOLE 150 MG PO TABS: 150.0000 mg | ORAL_TABLET | Freq: Once | ORAL | Status: DC

## 2012-01-26 MED ORDER — TESTOSTERONE CYPIONATE 200 MG/ML IM SOLN
200.0000 mg | Freq: Once | INTRAMUSCULAR | Status: AC
  Administered 2012-01-26: 200 mg via INTRAMUSCULAR

## 2012-01-26 MED ORDER — INSULIN GLARGINE 100 UNIT/ML ~~LOC~~ SOLN: 30.0000 [IU] | Freq: Every day | SUBCUTANEOUS | Status: DC

## 2012-01-26 NOTE — Patient Instructions (Signed)
Bring the testosterone shot back for injection today.

## 2012-01-26 NOTE — Addendum Note (Signed)
Addended by: Thelma Barge D on: 01/26/2012 02:37 PM   Modules accepted: Orders

## 2012-01-26 NOTE — Progress Notes (Signed)
77 yo with lower extremity soreness which has improved with the prednisone but he developed a sore mouth and fatigue.  He has been referred to orthopedics.  Sugar has been over 200 late in the day.  Due for cataract surgery next week.  Objective: Reddened oroph mucosa Neck: supple, no adenopathy  Results for orders placed in visit on 01/17/12  POCT GLYCOSYLATED HEMOGLOBIN (HGB A1C)      Component Value Range   Hemoglobin A1C 9.2    COMPREHENSIVE METABOLIC PANEL      Component Value Range   Sodium 135  135 - 145 mEq/L   Potassium 4.4  3.5 - 5.3 mEq/L   Chloride 101  96 - 112 mEq/L   CO2 26  19 - 32 mEq/L   Glucose, Bld 113 (*) 70 - 99 mg/dL   BUN 13  6 - 23 mg/dL   Creat 6.29  5.28 - 4.13 mg/dL   Total Bilirubin 0.6  0.3 - 1.2 mg/dL   Alkaline Phosphatase 73  39 - 117 U/L   AST 16  0 - 37 U/L   ALT 12  0 - 53 U/L   Total Protein 7.6  6.0 - 8.3 g/dL   Albumin 3.7  3.5 - 5.2 g/dL   Calcium 9.1  8.4 - 24.4 mg/dL   Assessment:  Needs testosterone shot, thrush  Plan:  Come back with testosterone vial diflucan

## 2012-02-07 ENCOUNTER — Telehealth: Payer: Self-pay

## 2012-02-07 NOTE — Telephone Encounter (Signed)
Patient states that he needs his Parking Placard Form filled out.  Patient also would like Dr Milus Glazier to know that he has not heard from the ortho dr and is needing to get in the see someone soon.   Best#:[phone]

## 2012-02-07 NOTE — Telephone Encounter (Signed)
Can you check on the referral to foot doctor?

## 2012-02-12 ENCOUNTER — Telehealth: Payer: Self-pay

## 2012-02-12 DIAGNOSIS — E119 Type 2 diabetes mellitus without complications: Secondary | ICD-10-CM

## 2012-02-12 MED ORDER — INSULIN GLARGINE 100 UNIT/ML ~~LOC~~ SOLN: 30.0000 [IU] | Freq: Every day | SUBCUTANEOUS | Status: DC

## 2012-02-12 NOTE — Telephone Encounter (Signed)
Pt came into 102 and stated that he has only been getting #5 insulin pens at a time instead of #10 and asked that we contact [facility] and straighten out Rx. Also gave pt his completed handicapped placard application.

## 2012-02-12 NOTE — Telephone Encounter (Signed)
Dr L had specified on Rx for solostar pens that 2 packs of 5 pens be given to pt per RF, but the quantity was only written for #5 pens. Sent in corrected Rx to [facility].

## 2012-02-12 NOTE — Telephone Encounter (Signed)
Pens were sent in for him today, he states he is using twice a day;pls advise

## 2012-02-12 NOTE — Telephone Encounter (Signed)
[facility] called to say the patient is taking the insulin glargine (LANTUS SOLOSTAR) 100 UNIT/ML injection Twice a day - told the pharmacist it is the only way he can keep his sugar under 200.  He has run out of pens. [phone]

## 2012-02-12 NOTE — Telephone Encounter (Signed)
Lantus is a 24 hour medicine.  He only needs to take the total insulin amount once a day.  Let me know if I need to reorder the Lantus.  He should take 30 units once a day and let me know if the sugar continues to run high

## 2012-02-13 NOTE — Telephone Encounter (Signed)
Thanks, I called patient to advise. [phone] is his cell # I advised him of this and he will let us know how his sugar runs on this dose after several days, he states it fluctuates.

## 2012-02-19 ENCOUNTER — Other Ambulatory Visit: Payer: Self-pay | Admitting: Family Medicine

## 2012-02-19 DIAGNOSIS — E119 Type 2 diabetes mellitus without complications: Secondary | ICD-10-CM

## 2012-02-19 MED ORDER — INSULIN GLARGINE 100 UNIT/ML ~~LOC~~ SOLN: 45.0000 [IU] | Freq: Every day | SUBCUTANEOUS | Status: DC

## 2012-02-19 NOTE — Telephone Encounter (Signed)
Dr Milus Glazier has already sent in new Rx. LMOM for pt to CB.

## 2012-02-19 NOTE — Telephone Encounter (Signed)
Patient is requesting the status of his insulin.  Pt is out of his medicine.   Pharmacy: [facility] [address]  Best#: [phone]

## 2012-02-19 NOTE — Telephone Encounter (Signed)
Patient did get the insulin using 30 units in the am his sugars are running 300- 400 he states he runs out of the Lantus tomorrow. Please advise.

## 2012-02-19 NOTE — Telephone Encounter (Signed)
Increase the lantus to 45 units daily and follow up in 1 week

## 2012-02-20 NOTE — Telephone Encounter (Signed)
Notified pt of new Rx and instr's for use and f/up. Pt agreed.

## 2012-02-21 ENCOUNTER — Ambulatory Visit: Payer: Medicare Other | Admitting: Family Medicine

## 2012-02-26 ENCOUNTER — Other Ambulatory Visit: Payer: Self-pay | Admitting: Family Medicine

## 2012-02-27 ENCOUNTER — Ambulatory Visit (INDEPENDENT_AMBULATORY_CARE_PROVIDER_SITE_OTHER): Payer: Medicare Other | Admitting: Family Medicine

## 2012-02-27 VITALS — BP 136/63 | HR 99 | Temp 98.2°F | Resp 16 | Ht 69.5 in | Wt 200.0 lb

## 2012-02-27 DIAGNOSIS — R059 Cough, unspecified: Secondary | ICD-10-CM

## 2012-02-27 DIAGNOSIS — K117 Disturbances of salivary secretion: Secondary | ICD-10-CM

## 2012-02-27 DIAGNOSIS — R05 Cough: Secondary | ICD-10-CM

## 2012-02-27 DIAGNOSIS — E119 Type 2 diabetes mellitus without complications: Secondary | ICD-10-CM

## 2012-02-27 DIAGNOSIS — R682 Dry mouth, unspecified: Secondary | ICD-10-CM

## 2012-02-27 LAB — GLUCOSE, POCT (MANUAL RESULT ENTRY): POC Glucose: 260 mg/dl — AB (ref 70–99)

## 2012-02-27 LAB — POCT CBC
Granulocyte percent: 58.2 %G (ref 37–80)
HCT, POC: 46.7 % (ref 43.5–53.7)
Hemoglobin: 14.6 g/dL (ref 14.1–18.1)
Lymph, poc: 3.1 (ref 0.6–3.4)
MCH, POC: 28.3 pg (ref 27–31.2)
MCHC: 31.3 g/dL — AB (ref 31.8–35.4)
MCV: 90.7 fL (ref 80–97)
MID (cbc): 0.7 (ref 0–0.9)
MPV: 9.9 fL (ref 0–99.8)
POC Granulocyte: 5.3 (ref 2–6.9)
POC LYMPH PERCENT: 33.9 %L (ref 10–50)
POC MID %: 7.9 %M (ref 0–12)
Platelet Count, POC: 337 10*3/uL (ref 142–424)
RBC: 5.15 M/uL (ref 4.69–6.13)
RDW, POC: 15.5 %
WBC: 9.1 10*3/uL (ref 4.6–10.2)

## 2012-02-27 LAB — POCT GLYCOSYLATED HEMOGLOBIN (HGB A1C): Hemoglobin A1C: 10

## 2012-02-27 NOTE — Patient Instructions (Addendum)
Stop the amytriptyline.  Check sugars twice a day.

## 2012-02-27 NOTE — Progress Notes (Signed)
77 yo known diabetic whose wife was hospitalized recently.  He complains of mild cough and 1 week of dry mouth.  He checked his sugar this morning and it was 95.  No polyuria or abdominal pain  Patient is still working  Objective:  NAD Edentulous upper, no erythema in mouth Chest: clear Neck:  Supple, no adenopathy Results for orders placed in visit on 02/27/12  GLUCOSE, POCT (MANUAL RESULT ENTRY)      Component Value Range   POC Glucose 260 (*) 70 - 99 mg/dl  POCT CBC      Component Value Range   WBC 9.1  4.6 - 10.2 K/uL   Lymph, poc 3.1  0.6 - 3.4   POC LYMPH PERCENT 33.9  10 - 50 %L   MID (cbc) 0.7  0 - 0.9   POC MID % 7.9  0 - 12 %M   POC Granulocyte 5.3  2 - 6.9   Granulocyte percent 58.2  37 - 80 %G   RBC 5.15  4.69 - 6.13 M/uL   Hemoglobin 14.6  14.1 - 18.1 g/dL   HCT, POC 16.1  09.6 - 53.7 %   MCV 90.7  80 - 97 fL   MCH, POC 28.3  27 - 31.2 pg   MCHC 31.3 (*) 31.8 - 35.4 g/dL   RDW, POC 04.5     Platelet Count, POC 337  142 - 424 K/uL   MPV 9.9  0 - 99.8 fL   \ Assessment:  Dry mouth secondary to hyperglycemia.  Plan:   Check BS bid. Stop the amitryptilline

## 2012-03-12 ENCOUNTER — Other Ambulatory Visit: Payer: Self-pay | Admitting: Radiology

## 2012-03-12 ENCOUNTER — Ambulatory Visit (INDEPENDENT_AMBULATORY_CARE_PROVIDER_SITE_OTHER): Payer: Medicare Other | Admitting: *Deleted

## 2012-03-12 ENCOUNTER — Telehealth: Payer: Self-pay

## 2012-03-12 DIAGNOSIS — E291 Testicular hypofunction: Secondary | ICD-10-CM

## 2012-03-12 DIAGNOSIS — E236 Other disorders of pituitary gland: Secondary | ICD-10-CM

## 2012-03-12 MED ORDER — GLUCOSE BLOOD VI STRP: ORAL_STRIP | Status: DC

## 2012-03-12 MED ORDER — TESTOSTERONE CYPIONATE 200 MG/ML IM SOLN
200.0000 mg | Freq: Once | INTRAMUSCULAR | Status: AC
  Administered 2012-03-12: 200 mg via INTRAMUSCULAR

## 2012-03-12 NOTE — Telephone Encounter (Signed)
Pt states the [facility] sent a request for his strips four days ago.  He is completely out.  [phone]

## 2012-03-12 NOTE — Telephone Encounter (Signed)
Sent this in for him. [facility] to advise this is to ck sugars tid, dx code is 250.00

## 2012-03-27 ENCOUNTER — Other Ambulatory Visit: Payer: Self-pay | Admitting: Family Medicine

## 2012-04-28 ENCOUNTER — Other Ambulatory Visit: Payer: Self-pay | Admitting: Physician Assistant

## 2012-05-06 ENCOUNTER — Ambulatory Visit (INDEPENDENT_AMBULATORY_CARE_PROVIDER_SITE_OTHER): Payer: Medicare Other | Admitting: Family Medicine

## 2012-05-06 ENCOUNTER — Telehealth: Payer: Self-pay | Admitting: Radiology

## 2012-05-06 VITALS — BP 126/78 | HR 99 | Temp 98.4°F | Resp 17 | Ht 69.5 in | Wt 199.0 lb

## 2012-05-06 DIAGNOSIS — E1065 Type 1 diabetes mellitus with hyperglycemia: Secondary | ICD-10-CM

## 2012-05-06 DIAGNOSIS — E291 Testicular hypofunction: Secondary | ICD-10-CM

## 2012-05-06 DIAGNOSIS — E785 Hyperlipidemia, unspecified: Secondary | ICD-10-CM

## 2012-05-06 LAB — LIPID PANEL
Cholesterol: 133 mg/dL (ref 0–200)
HDL: 39 mg/dL — ABNORMAL LOW (ref >39)
LDL Cholesterol: 81 mg/dL (ref 0–99)
Total CHOL/HDL Ratio: 3.4 Ratio
Triglycerides: 64 mg/dL (ref <150)
VLDL: 13 mg/dL (ref 0–40)

## 2012-05-06 LAB — POCT GLYCOSYLATED HEMOGLOBIN (HGB A1C): Hemoglobin A1C: 11.3

## 2012-05-06 LAB — COMPREHENSIVE METABOLIC PANEL
ALT: 13 U/L (ref 0–53)
AST: 16 U/L (ref 0–37)
Albumin: 4.1 g/dL (ref 3.5–5.2)
Alkaline Phosphatase: 81 U/L (ref 39–117)
BUN: 24 mg/dL — ABNORMAL HIGH (ref 6–23)
CO2: 27 mEq/L (ref 19–32)
Calcium: 9.6 mg/dL (ref 8.4–10.5)
Chloride: 94 mEq/L — ABNORMAL LOW (ref 96–112)
Creat: 1.23 mg/dL (ref 0.50–1.35)
Glucose, Bld: 269 mg/dL — ABNORMAL HIGH (ref 70–99)
Potassium: 4.6 mEq/L (ref 3.5–5.3)
Sodium: 132 mEq/L — ABNORMAL LOW (ref 135–145)
Total Bilirubin: 0.9 mg/dL (ref 0.3–1.2)
Total Protein: 7.9 g/dL (ref 6.0–8.3)

## 2012-05-06 MED ORDER — INSULIN GLARGINE 100 UNIT/ML ~~LOC~~ SOLN: 50.0000 [IU] | Freq: Every day | SUBCUTANEOUS | Status: DC

## 2012-05-06 MED ORDER — TESTOSTERONE CYPIONATE 100 MG/ML IM SOLN: 200.0000 mg | INTRAMUSCULAR | Status: DC

## 2012-05-06 MED ORDER — GLUCOSE BLOOD VI STRP: ORAL_STRIP | Status: DC

## 2012-05-06 MED ORDER — LISINOPRIL-HYDROCHLOROTHIAZIDE 10-12.5 MG PO TABS: 1.0000 | ORAL_TABLET | Freq: Every day | ORAL | Status: DC

## 2012-05-06 MED ORDER — SIMVASTATIN 40 MG PO TABS: 40.0000 mg | ORAL_TABLET | Freq: Every day | ORAL | Status: DC

## 2012-05-06 NOTE — Progress Notes (Signed)
Patient ID: WYNNE JURY MRN: [id number], DOB: 1934-04-30, 78 y.o. Date of Encounter: 05/06/2012, 7:58 AM  Primary Physician: Abbe Amsterdam, MD  Chief Complaint: Diabetes follow up  HPI: 78 y.o. year old male with history below presents for follow up of diabetes mellitus. Doing well. No issues or complaints. Taking medications daily without adverse effects. No polydipsia, polyphagia, polyuria, or nocturia.  Blood sugars at home: 260  Last A1C: 10  Eye MD:  [facility].  He has appt soon for his cataracts Last microalb:  May 2013 Pneumococcal vaccine: over 10 years ago  Patient still working on [employer] comp case after accident almost 3 years ago and feet and lower legs still hurt and swell every day.   Past Medical History  Diagnosis Date  . HTN (hypertension)   . Diabetes mellitus   . Hypercholesteremia   . TIA (transient ischemic attack)   . Iron deficiency   . Arthritis   . Stroke      Home Meds: Prior to Admission medications   Medication Sig Start Date End Date Taking? Authorizing Provider  clopidogrel (PLAVIX) 75 MG tablet Take 1 tablet (75 mg total) by mouth daily. 01/17/12  Yes Elvina Sidle, MD  fish oil-omega-3 fatty acids 1000 MG capsule Take 2 g by mouth daily.     Yes Historical Provider, MD  Fluticasone-Salmeterol (ADVAIR) 250-50 MCG/DOSE AEPB Inhale 1 puff into the lungs daily. 05/28/11  Yes Tonye Pearson, MD  furosemide (LASIX) 20 MG tablet TAKE ONE TABLET BY MOUTH EVERY DAY 03/27/12  Yes Raymon Mutton Dunn, PA-C  glucose blood test strip Use as instructed 03/12/12  Yes Heather M Marte, PA-C  insulin glargine (LANTUS SOLOSTAR) 100 UNIT/ML injection Inject 45 Units into the skin at bedtime. 02/19/12 02/18/13 Yes Elvina Sidle, MD  ketoconazole (NIZORAL) 2 % cream Apply topically daily. 07/05/11 07/04/12 Yes Elvina Sidle, MD  l-methylfolate-B6-B12 [facility]) 3-35-2 MG TABS Take 1 tablet by mouth daily.     Yes Historical Provider, MD  Lancets MISC To  check blood glucose tid 01/19/12  Yes Elvina Sidle, MD  metFORMIN (GLUCOPHAGE) 1000 MG tablet Take 1 tablet (1,000 mg total) by mouth 2 (two) times daily with a meal. 01/17/12 01/16/13 Yes Elvina Sidle, MD  Multiple Vitamins-Minerals (MULTIVITAMIN WITH MINERALS) tablet Take 1 tablet by mouth daily.     Yes Historical Provider, MD  omeprazole (PRILOSEC) 20 MG capsule Take 1 capsule (20 mg total) by mouth daily. 01/17/12  Yes Elvina Sidle, MD  simvastatin (ZOCOR) 40 MG tablet TAKE ONE TABLET BY MOUTH AT BEDTIME 10/24/11  Yes Morrell Riddle, PA-C  simvastatin (ZOCOR) 40 MG tablet Take 1 tablet (40 mg total) by mouth at bedtime. Due for office visit/labs 04/28/12  Yes Heather M Marte, PA-C  sitaGLIPtin (JANUVIA) 100 MG tablet Take 1 tablet (100 mg total) by mouth daily. 01/17/12  Yes Elvina Sidle, MD  triamterene-hydrochlorothiazide (MAXZIDE-25) 37.5-25 MG per tablet Take 1 each (1 tablet total) by mouth daily. 01/17/12  Yes Elvina Sidle, MD  Vitamin D, Ergocalciferol, (DRISDOL) 50000 UNITS CAPS Take 50,000 Units by mouth.     Yes Historical Provider, MD    Allergies: No Known Allergies  History   Social History  . Marital Status: Married    Spouse Name: N/A    Number of Children: N/A  . Years of Education: N/A   Occupational History  . Not on file.   Social History Main Topics  . Smoking status: Former Smoker -- 35 years  Types: Cigarettes    Quit date: 04/09/1990  . Smokeless tobacco: Never Used  . Alcohol Use: No     Comment: "per pt stopped drinking in the 90's"  . Drug Use: No  . Sexually Active: Not on file   Other Topics Concern  . Not on file   Social History Narrative  . No narrative on file     Review of Systems: Constitutional: negative for chills, fever, night sweats, weight changes, or fatigue  HEENT: negative for vision changes, hearing loss, congestion, rhinorrhea, or epistaxis Cardiovascular: negative for chest pain, palpitations, diaphoresis,  DOE, orthopnea, or edema Respiratory: negative for hemoptysis, wheezing, shortness of breath, dyspnea, or cough Abdominal: negative for abdominal pain, nausea, vomiting, diarrhea, or constipation Dermatological: negative for rash, erythema, or wounds Neurologic: negative for headache, dizziness, or syncope Renal:  Negative for polyuria, polydipsia, or dysuria All other systems reviewed and are otherwise negative with the exception to those above and in the HPI.   Physical Exam: Blood pressure 126/78, pulse 99, temperature 98.4 F (36.9 C), temperature source Oral, resp. rate 17, height 5' 9.5" (1.765 m), weight 199 lb (90.266 kg), SpO2 95.00%., Body mass index is 28.97 kg/(m^2). General: Well developed, well nourished, in no acute distress. Head: Normocephalic, atraumatic, eyes without discharge, sclera non-icteric, nares are without discharge. Bilateral auditory canals clear, TM's are without perforation, pearly grey and translucent with reflective cone of light bilaterally. Oral cavity moist, posterior pharynx without exudate, erythema, peritonsillar abscess, or post nasal drip.  Neck: Supple. No thyromegaly. Full ROM. No lymphadenopathy. Lungs: Clear bilaterally to auscultation without wheezes, rales, or rhonchi. Breathing is unlabored. Heart: RRR with S1 S2. No murmurs, rubs, or gallops appreciated. Abdomen: Soft, non-tender, non-distended with normoactive bowel sounds. No hepatosplenomegaly. No rebound/guarding. No obvious abdominal masses. Msk:  Strength and tone normal for age. Extremities/Skin: Warm and dry. No clubbing or cyanosis. No edema. No rashes, wounds, or suspicious lesions. Monofilament exam unremarkable bilaterally.  Neuro: Alert and oriented X 3. Moves all extremities spontaneously. Gait is normal. CNII-XII grossly in tact. Psych:  Responds to questions appropriately with a normal affect.   Labs:   ASSESSMENT AND PLAN:  78 y.o. year old male with uncontrolled  diabetes 1. Diabetes type 1, uncontrolled  POCT glycosylated hemoglobin (Hb A1C), Lipid panel, Comprehensive metabolic panel, simvastatin (ZOCOR) 40 MG tablet, insulin glargine (LANTUS SOLOSTAR) 100 UNIT/ML injection  2. Hyperlipidemia  simvastatin (ZOCOR) 40 MG tablet  3. Diabetes  insulin glargine (LANTUS SOLOSTAR) 100 UNIT/ML injection    -  Signed, Elvina Sidle, MD 05/06/2012 7:58 AM

## 2012-05-06 NOTE — Telephone Encounter (Signed)
Pharmacy has called, they [employer]) are having problems with Medicare Rx and Heather's DEA #,please advise, do we need to check into this? They have had several Rx's kick back, this patients test strips are among them I have resubmitted in Dr L name. Amy

## 2012-05-08 ENCOUNTER — Other Ambulatory Visit: Payer: Self-pay | Admitting: Radiology

## 2012-05-14 ENCOUNTER — Ambulatory Visit (INDEPENDENT_AMBULATORY_CARE_PROVIDER_SITE_OTHER): Payer: Medicare Other | Admitting: Family Medicine

## 2012-05-14 ENCOUNTER — Ambulatory Visit: Payer: Medicare Other

## 2012-05-14 VITALS — BP 135/66 | HR 95 | Temp 97.9°F | Resp 18 | Ht 68.0 in | Wt 192.4 lb

## 2012-05-14 DIAGNOSIS — R82998 Other abnormal findings in urine: Secondary | ICD-10-CM

## 2012-05-14 DIAGNOSIS — R8290 Unspecified abnormal findings in urine: Secondary | ICD-10-CM

## 2012-05-14 DIAGNOSIS — K5900 Constipation, unspecified: Secondary | ICD-10-CM

## 2012-05-14 DIAGNOSIS — R1011 Right upper quadrant pain: Secondary | ICD-10-CM

## 2012-05-14 LAB — POCT URINALYSIS DIPSTICK
Protein, UA: 100
Spec Grav, UA: >=1.03
Urobilinogen, UA: 0.2
pH, UA: 5.5

## 2012-05-14 LAB — POCT UA - MICROSCOPIC ONLY
Bacteria, U Microscopic: NEGATIVE
Crystals, Ur, HPF, POC: NEGATIVE
Mucus, UA: NEGATIVE

## 2012-05-14 LAB — POCT CBC
Hemoglobin: 14.6 g/dL (ref 14.1–18.1)
Lymph, poc: 3.1 (ref 0.6–3.4)
MCH, POC: 29.7 pg (ref 27–31.2)
MCHC: 33 g/dL (ref 31.8–35.4)
MCV: 90 fL (ref 80–97)
MID (cbc): 0.8 (ref 0–0.9)
Platelet Count, POC: 369 10*3/uL (ref 142–424)
RBC: 4.92 M/uL (ref 4.69–6.13)
WBC: 11.4 10*3/uL — AB (ref 4.6–10.2)

## 2012-05-14 IMAGING — CR DG ABDOMEN 2V
2 series · 2 of 2 positions shown · non-contrast
Comparison: None.

CLINICAL DATA: Right upper quadrant abdominal pain

ABDOMEN - 2 VIEW

[AP (1 of 2)]
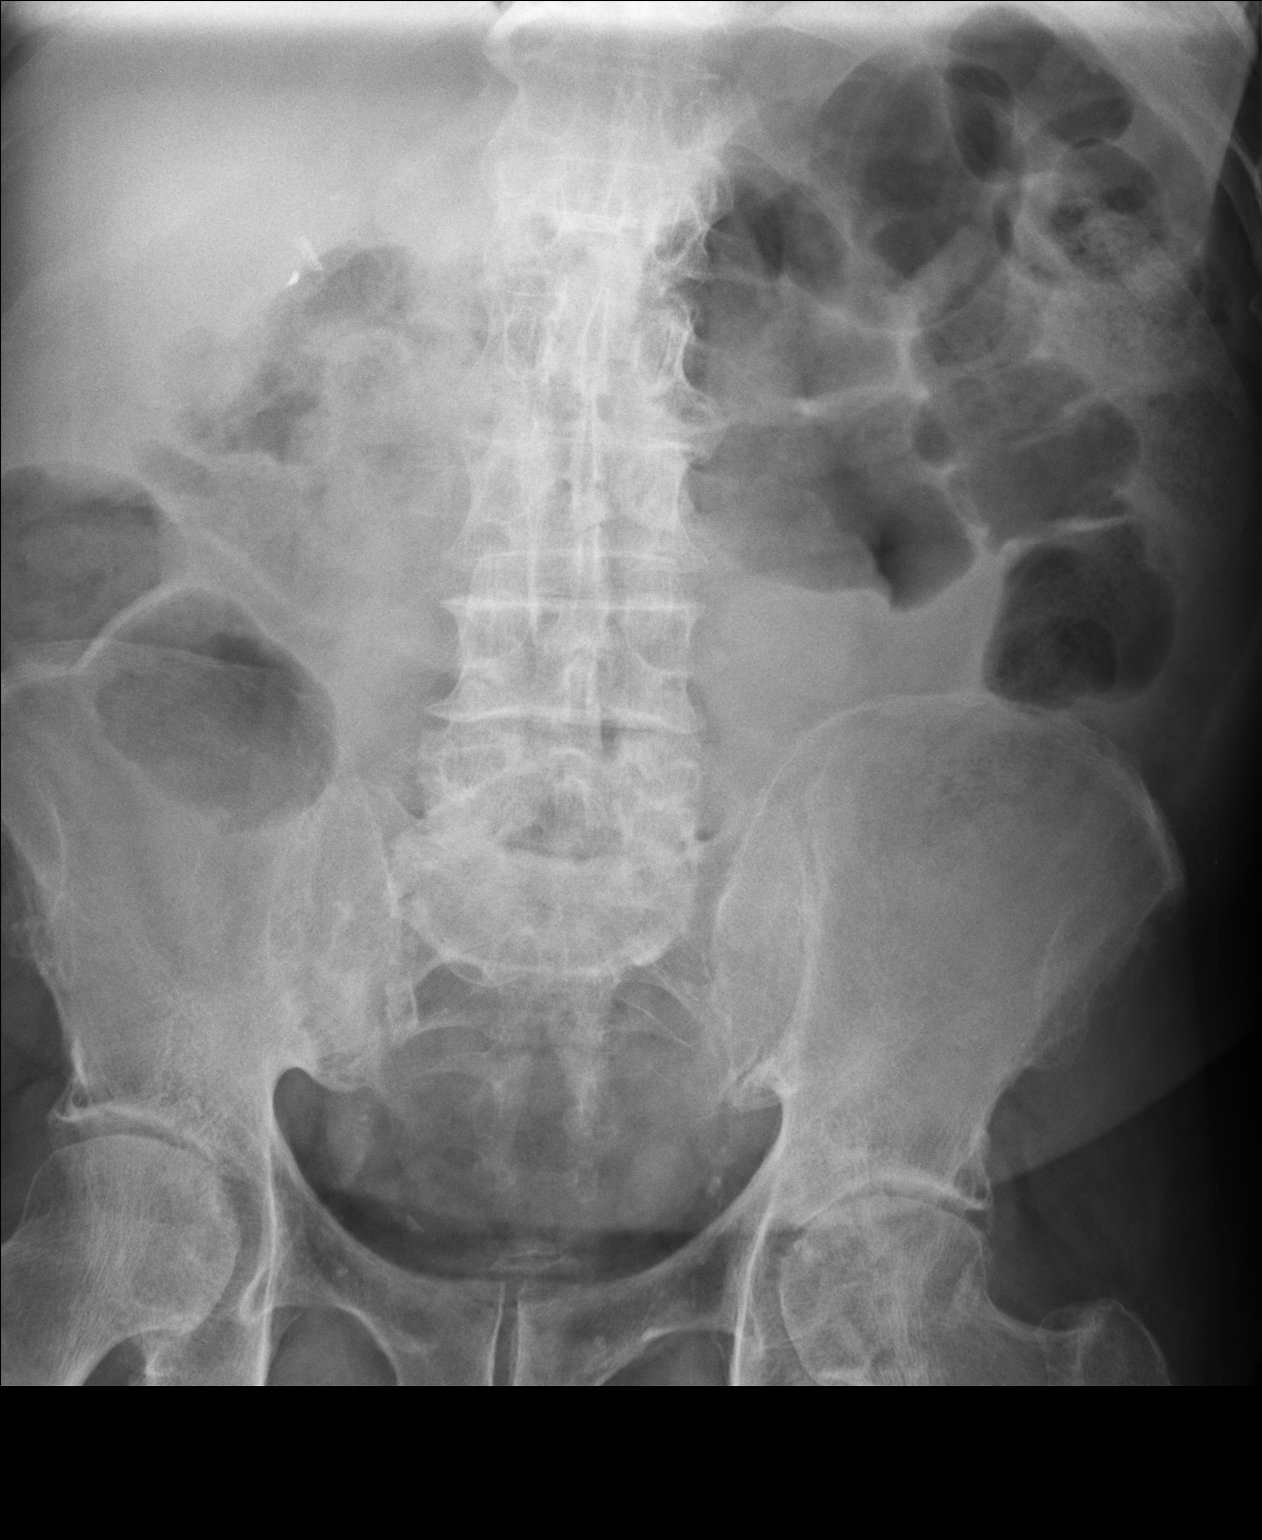

[AP (2 of 2)]
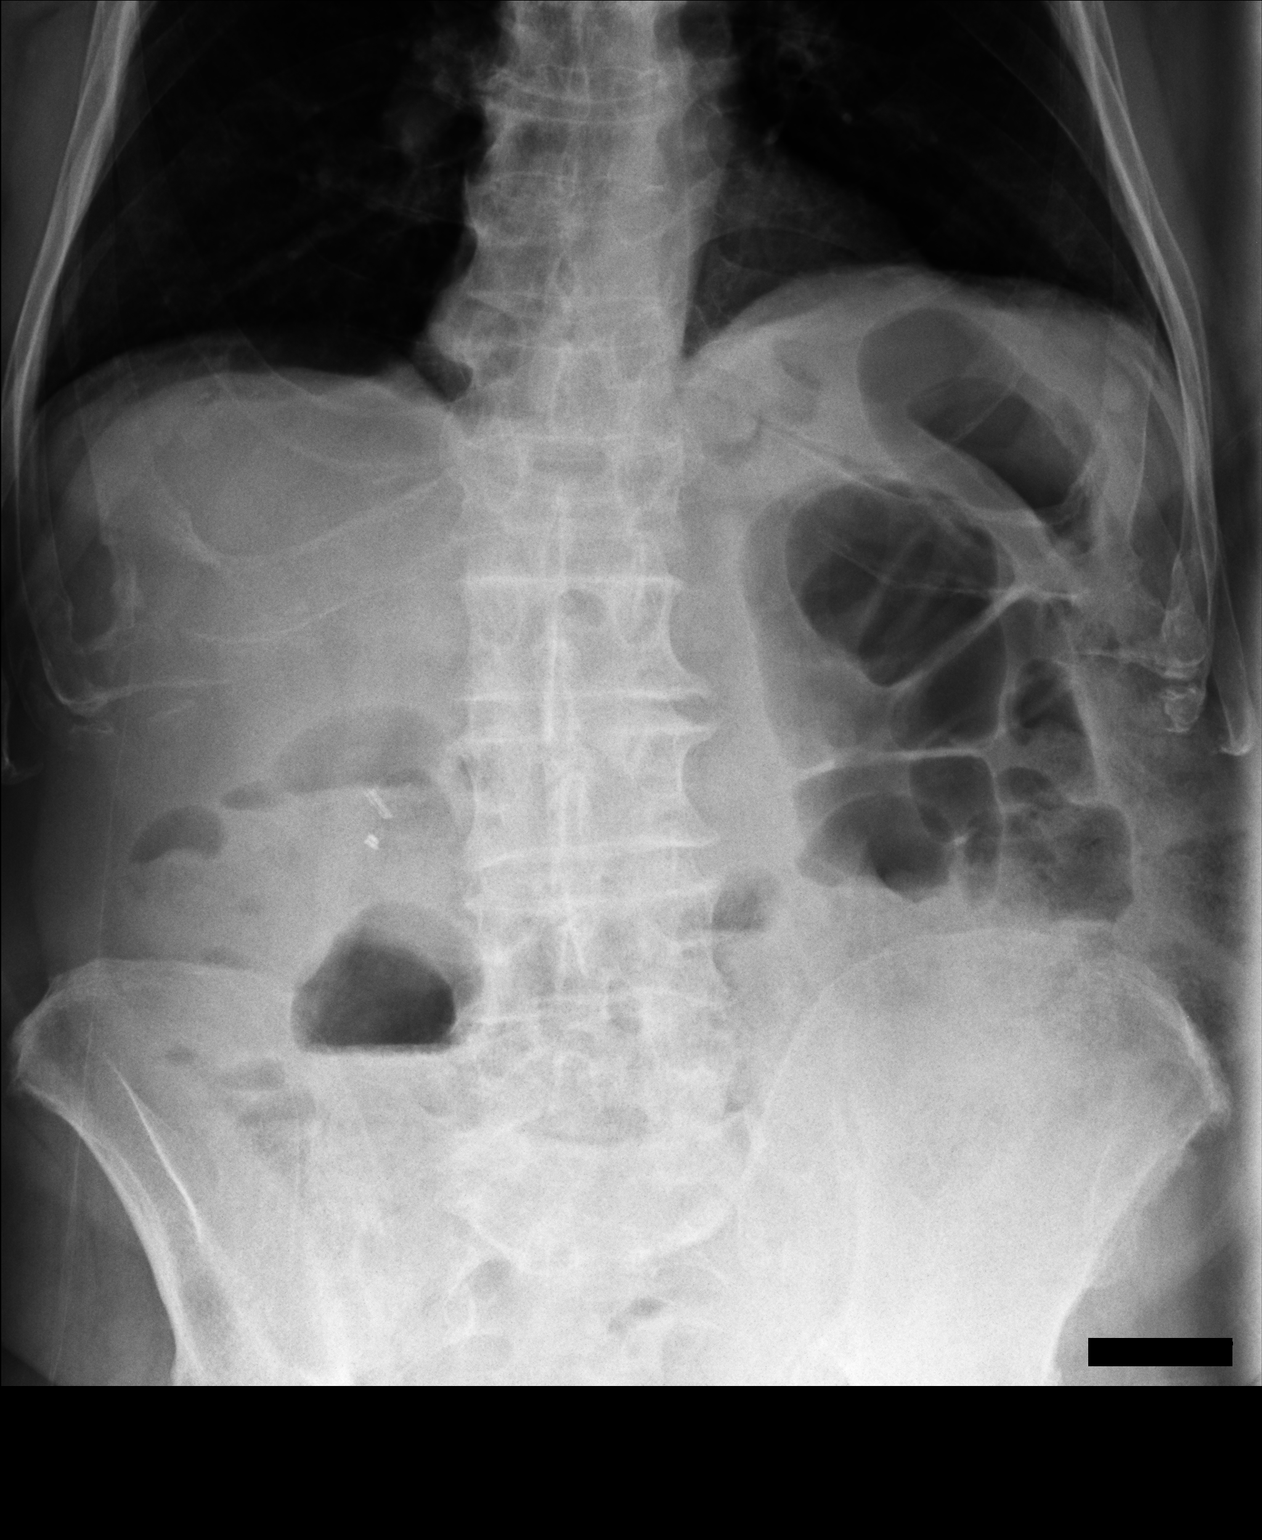

[2 of 2 positions shown; findings below may reference images not displayed]

FINDINGS: Supine and erect views the abdomen show no definite
evidence of bowel obstruction.  Only slight gaseous distention of
the colon is seen.  No free air is noted on the erect view although
there is respiratory motion obscuring detail.  Surgical clips are
present from prior cholecystectomy.  There are degenerative changes
noted in the lower lumbar spine and in the left hip.
IMPRESSION: No bowel obstruction.  No definite free air.

## 2012-05-14 NOTE — Progress Notes (Signed)
Subjective: 78 year old man who presents with a history of one week of abdominal pain. His bowels do not move her up frequently, but he took some ex-lax yesterday and had a good bowel movement. There is maybe a little relief but not much. It's been just a constant discomfort in the right epigastrium. No nausea or vomiting. No diarrhea. No blood in his stool. No urinary complaints. No fevers. Has had pains like this sometimes in the past, but never had them last like this and never had any medical workup for that. He has lost 18 pounds in the last year. He says he just doesn't feel like he can eat like he used to. His wife's coat, they have good food. He is on diabetic medication and takes it faithfully. Past medical history is as reviewed on the chart.  Says he is better today than he was past few days, that the BM helped  Objective: Pleasant gentleman in no major distress. HEENT normal. Throat clear. Neck supple without nodes. Chest is clear to auscultation. Heart regular without murmurs. Abdomen has normal bowel sounds. No bruits were heard. Soft without in the major tenderness. Mild tenderness in the epigastric region. There is a masslike effect of fullness in the right upper quadrant. Ankles swell as the day goes on. He has history of fractures of both ankles.  Assessment: Right upper quadrant abdominal pain and fullness.  Constipation versus some other etiology. 18 pound weight loss  Plan: X-ray abdomen, check CBC and urine. C. met was normal one week ago except for the diabetes. Weight loss concerns me. Results for orders placed in visit on 05/14/12  POCT CBC      Component Value Range   WBC 11.4 (*) 4.6 - 10.2 K/uL   Lymph, poc 3.1  0.6 - 3.4   POC LYMPH PERCENT 27.2  10 - 50 %L   MID (cbc) 0.8  0 - 0.9   POC MID % 7.3  0 - 12 %M   POC Granulocyte 7.5 (*) 2 - 6.9   Granulocyte percent 65.5  37 - 80 %G   RBC 4.92  4.69 - 6.13 M/uL   Hemoglobin 14.6  14.1 - 18.1 g/dL   HCT, POC 16.1   09.6 - 53.7 %   MCV 90.0  80 - 97 fL   MCH, POC 29.7  27 - 31.2 pg   MCHC 33.0  31.8 - 35.4 g/dL   RDW, POC 04.5     Platelet Count, POC 369  142 - 424 K/uL   MPV 9.5  0 - 99.8 fL  POCT UA - MICROSCOPIC ONLY      Component Value Range   WBC, Ur, HPF, POC 1-5 with clumps     RBC, urine, microscopic 0-1     Bacteria, U Microscopic negative     Mucus, UA negative     Epithelial cells, urine per micros 0-1     Crystals, Ur, HPF, POC negative     Casts, Ur, LPF, POC hyaline     Yeast, UA negative    POCT URINALYSIS DIPSTICK      Component Value Range   Color, UA yellow     Clarity, UA clear     Glucose, UA 500     Bilirubin, UA negative     Ketones, UA negative     Spec Grav, UA >=1.030     Blood, UA trace     pH, UA 5.5     Protein, UA 100  Urobilinogen, UA 0.2     Nitrite, UA negative     Leukocytes, UA Negative    [facility] reading (PRIMARY) by  Dr. Bonney Leitz luq.  Single air fluid level right mid abdomen.  .   Impression: Abdominal pain Constipation Bowel gas Possible ruq mass Weight loss  Plan: Miralax Recheck 2 days CT if not better Has cataracts surgery next week With the weight loss I do not want to Miss anything, so we need to keep following up on this.

## 2012-05-14 NOTE — Patient Instructions (Signed)
Return on this Saturday, 2 days from now, and 8 AM  Drink lots of water  Take MiraLax one dose daily until stools are a little loose, then discontinue. If no BM by night from today dose, you can take a second dose.

## 2012-05-15 ENCOUNTER — Telehealth: Payer: Self-pay

## 2012-05-15 NOTE — Telephone Encounter (Signed)
Pt states he saw Dr. Alwyn Ren yesterday and the medication he gave patient is not working for him.   He would like something else, [facility] on [address]   CBN::  [phone]

## 2012-05-15 NOTE — Telephone Encounter (Signed)
He should keep taking, may take couple more doses to get relief. I have advised him to continue taking, to take 2 doses today and to drink plenty of water. He is advised to come in tomorrow for recheck. FYI

## 2012-05-16 NOTE — Telephone Encounter (Signed)
As per Amy Littrell's note.  See if sx continue.

## 2012-05-17 ENCOUNTER — Ambulatory Visit (INDEPENDENT_AMBULATORY_CARE_PROVIDER_SITE_OTHER): Payer: Medicare Other | Admitting: Family Medicine

## 2012-05-17 VITALS — BP 88/56 | HR 103 | Temp 98.0°F | Resp 20 | Ht 68.0 in | Wt 192.6 lb

## 2012-05-17 DIAGNOSIS — E119 Type 2 diabetes mellitus without complications: Secondary | ICD-10-CM

## 2012-05-17 DIAGNOSIS — R109 Unspecified abdominal pain: Secondary | ICD-10-CM

## 2012-05-17 DIAGNOSIS — D72829 Elevated white blood cell count, unspecified: Secondary | ICD-10-CM

## 2012-05-17 LAB — COMPREHENSIVE METABOLIC PANEL
AST: 13 U/L (ref 0–37)
Alkaline Phosphatase: 83 U/L (ref 39–117)
BUN: 34 mg/dL — ABNORMAL HIGH (ref 6–23)
Calcium: 9.4 mg/dL (ref 8.4–10.5)
Creat: 1.42 mg/dL — ABNORMAL HIGH (ref 0.50–1.35)
Total Bilirubin: 1.1 mg/dL (ref 0.3–1.2)

## 2012-05-17 LAB — POCT CBC
MCH, POC: 29.7 pg (ref 27–31.2)
MCHC: 33.3 g/dL (ref 31.8–35.4)
MCV: 89.2 fL (ref 80–97)
MID (cbc): 0.7 (ref 0–0.9)
MPV: 9.7 fL (ref 0–99.8)
POC LYMPH PERCENT: 23.1 %L (ref 10–50)
POC MID %: 6.2 %M (ref 0–12)
Platelet Count, POC: 389 10*3/uL (ref 142–424)
RDW, POC: 13.8 %
WBC: 11.4 10*3/uL — AB (ref 4.6–10.2)

## 2012-05-17 LAB — GLUCOSE, POCT (MANUAL RESULT ENTRY): POC Glucose: 198 mg/dl — AB (ref 70–99)

## 2012-05-17 NOTE — Patient Instructions (Signed)
Things appear to be stable at this time. I feel you are fine to have your surgery next week. If you should get more pain in the abdomen come  in before surgery. If things continue to do well come back in about 6 or 8 weeks, late March or early April, to be rechecked regarding the weight loss and general health.

## 2012-05-17 NOTE — Progress Notes (Signed)
Subjective: Patient has returned for a recheck. He was here the other day with some abdominal pain, mild leukocytosis, and losing weight. He did eat some yesterday. He says his sugar was 180 at home this morning. He has his cataract surgery next week. He feels better. His abdomen is not as painful. He has a little tenderness just below the xiphoid area. He said that the bowels did not get up well with just the MiraLax so he took a couple of clear pills that his wife had and it moved him well.  Objective: Pleasant elderly gentleman who looks okay today. Does not look like any distress. Abdomen is soft. Mildly tender in the hypogastrium. No fullness sensation to it like he had the other day. He does not have the right upper quadrant tenderness.  Assessment: Abdominal pain improved Leukocytosis, for recheck Anorexia and weight loss stabilized Diabetes  Plan: Check a CBC, C. met, and glucose on it today.  Results for orders placed in visit on 05/17/12  GLUCOSE, POCT (MANUAL RESULT ENTRY)      Result Value Range   POC Glucose 198 (*) 70 - 99 mg/dl  POCT CBC      Result Value Range   WBC 11.4 (*) 4.6 - 10.2 K/uL   Lymph, poc 2.6  0.6 - 3.4   POC LYMPH PERCENT 23.1  10 - 50 %L   MID (cbc) 0.7  0 - 0.9   POC MID % 6.2  0 - 12 %M   POC Granulocyte 8.1 (*) 2 - 6.9   Granulocyte percent 70.7  37 - 80 %G   RBC 4.88  4.69 - 6.13 M/uL   Hemoglobin 14.5  14.1 - 18.1 g/dL   HCT, POC 08.6  57.8 - 53.7 %   MCV 89.2  80 - 97 fL   MCH, POC 29.7  27 - 31.2 pg   MCHC 33.3  31.8 - 35.4 g/dL   RDW, POC 46.9     Platelet Count, POC 389  142 - 424 K/uL   MPV 9.7  0 - 99.8 fL   Labs are stable. Patient was reassured that he seems to be doing satisfactorily and we will just followup on the abdominal pain if needed and weight loss later in the spring.

## 2012-06-02 ENCOUNTER — Ambulatory Visit (INDEPENDENT_AMBULATORY_CARE_PROVIDER_SITE_OTHER): Payer: Medicare Other | Admitting: Family Medicine

## 2012-06-02 VITALS — BP 105/62 | HR 88 | Temp 98.0°F | Resp 16 | Ht 68.0 in | Wt 196.4 lb

## 2012-06-02 DIAGNOSIS — K859 Acute pancreatitis without necrosis or infection, unspecified: Secondary | ICD-10-CM

## 2012-06-02 DIAGNOSIS — R109 Unspecified abdominal pain: Secondary | ICD-10-CM

## 2012-06-02 DIAGNOSIS — E119 Type 2 diabetes mellitus without complications: Secondary | ICD-10-CM

## 2012-06-02 DIAGNOSIS — R05 Cough: Secondary | ICD-10-CM

## 2012-06-02 HISTORY — DX: Acute pancreatitis without necrosis or infection, unspecified: K85.90

## 2012-06-02 LAB — GLUCOSE, POCT (MANUAL RESULT ENTRY): POC Glucose: 180 mg/dl — AB (ref 70–99)

## 2012-06-02 LAB — POCT CBC
Granulocyte percent: 63 %G (ref 37–80)
MCV: 90.4 fL (ref 80–97)
MID (cbc): 0.7 (ref 0–0.9)
MPV: 8.6 fL (ref 0–99.8)
POC Granulocyte: 5.7 (ref 2–6.9)
POC LYMPH PERCENT: 29.5 %L (ref 10–50)
POC MID %: 7.5 %M (ref 0–12)
Platelet Count, POC: 349 10*3/uL (ref 142–424)
RDW, POC: 13.9 %

## 2012-06-02 LAB — COMPREHENSIVE METABOLIC PANEL
AST: 14 U/L (ref 0–37)
Albumin: 4 g/dL (ref 3.5–5.2)
Alkaline Phosphatase: 70 U/L (ref 39–117)
BUN: 23 mg/dL (ref 6–23)
Potassium: 4.7 mEq/L (ref 3.5–5.3)

## 2012-06-02 MED ORDER — CEFDINIR 300 MG PO CAPS: 300.0000 mg | ORAL_CAPSULE | Freq: Two times a day (BID) | ORAL | Status: DC

## 2012-06-02 NOTE — Progress Notes (Signed)
[facility] [address] [place] [address] [phone]  Date:  06/02/2012   Name:  Justin [name]   DOB:  [birth year]   MRN:  [id number]  PCP:  Abbe Amsterdam, MD    Chief Complaint: Follow-up   History of Present Illness:  Justin [name] is a 78 y.o. very pleasant male patient who presents with the following:  He is here to follow- up from a hospital visit.  He was here on 2/5 and 2/8 with abdominal pain.  On 2/9 he went to the hospital in [place]- [facility]-  and was diagnosed with pancreatitis.  He was kept in the hospital for 3 nights.  He feels "pretty good" now, but still has some soreness in his abdomen.    Reviewed records from hospital- discharge diagnoses included acute pancreatitis, pancreatic cyst, hyponatremia and IDDM with episodes of hypoglycemia, HTN. COPD and history of CVA.  He was noted to have a likely pancreatic cyst and will be seen at [facility] for an endoscopic ultrasound to evaluate this further.  Admission Lipase was 255, amylase 97, sodium 130 He is eating pretty well right now, about at his baseline.   No diarrhea or constipation He has not noted a fever.   He DID eat breakfast this am already  He will also see DR. Shearin at [facility] soon for follow- up.    He notes that his FBG is still elevated, but it seems to be getting better slowly.  His DM control got a lot worse during his recent illness but is coming back to baseline.  His last A1c in 04/2012 was 11.3.    He is also concerned about "this cold and I just can't shake it."  He has been coughing for 2 or 3 weeks, and also has a runny nose.  He will cough up phlegm sometimes.    He was not discharged with any different medicaitons  Patient Active Problem List  Diagnosis  . Headache above the eye region  . Diabetes mellitus type 1  . TIA on medication  . Coronary artery disease  . Testosterone deficiency    Past Medical History  Diagnosis Date  . HTN (hypertension)    . Diabetes mellitus   . Hypercholesteremia   . TIA (transient ischemic attack)   . Iron deficiency   . Arthritis   . Stroke     Past Surgical History  Procedure Laterality Date  . Back surgery  1985  . Neck surgery  1985  . Temporal artery biopsy / ligation  02/26/2011    left side  . Artery biopsy  02/26/2011    Procedure: BIOPSY TEMPORAL ARTERY;  Surgeon: Iona Coach, MD;  Location: [facility];  Service: General;  Laterality: Left;  Left temporal artery biospy    History  Substance Use Topics  . Smoking status: Former Smoker -- 35 years    Types: Cigarettes    Quit date: 04/09/1990  . Smokeless tobacco: Never Used  . Alcohol Use: No     Comment: "per pt stopped drinking in the 90's"    Family History  Problem Relation Age of Onset  . Heart disease Mother     No Known Allergies  Medication list has been reviewed and updated.  Current Outpatient Prescriptions on File Prior to Visit  Medication Sig Dispense Refill  . clopidogrel (PLAVIX) 75 MG tablet Take 1 tablet (75 mg total) by mouth daily.  30 tablet  10  . fish oil-omega-3 fatty acids 1000 MG capsule Take 2 g by mouth daily.        . Fluticasone-Salmeterol (ADVAIR) 250-50 MCG/DOSE AEPB Inhale 1 puff into the lungs daily.      . furosemide (LASIX) 20 MG tablet TAKE ONE TABLET BY MOUTH EVERY DAY  30 tablet  2  . glucose blood test strip Use as instructed  100 each  2  . insulin glargine (LANTUS SOLOSTAR) 100 UNIT/ML injection Inject 50 Units into the skin at bedtime.  10 pen  PRN  . ketoconazole (NIZORAL) 2 % cream Apply topically daily.  15 g  0  . l-methylfolate-B6-B12 (METANX) 3-35-2 MG TABS Take 1 tablet by mouth daily.        . Lancets MISC To check blood glucose tid  100 each  5  . lisinopril-hydrochlorothiazide (PRINZIDE,ZESTORETIC) 10-12.5 MG per tablet Take 1 tablet by mouth daily.  90 tablet  3  . metFORMIN (GLUCOPHAGE) 1000 MG tablet Take 1 tablet (1,000 mg total) by mouth 2 (two)  times daily with a meal.  180 tablet  3  . Multiple Vitamins-Minerals (MULTIVITAMIN WITH MINERALS) tablet Take 1 tablet by mouth daily.        Marland Kitchen omeprazole (PRILOSEC) 20 MG capsule Take 1 capsule (20 mg total) by mouth daily.  90 capsule  3  . simvastatin (ZOCOR) 40 MG tablet Take 1 tablet (40 mg total) by mouth at bedtime. Due for office visit/labs  30 tablet  1  . Vitamin D, Ergocalciferol, (DRISDOL) 50000 UNITS CAPS Take 50,000 Units by mouth.        . [DISCONTINUED] testosterone cypionate (DEPOTESTOTERONE CYPIONATE) 200 MG/ML injection Inject into the muscle every 14 (fourteen) days.       Current Facility-Administered Medications on File Prior to Visit  Medication Dose Route Frequency Provider Last Rate Last Dose  . testosterone cypionate (DEPOTESTOTERONE CYPIONATE) injection 200 mg  200 mg Intramuscular Q14 Days Elvina Sidle, MD   200 mg at 09/13/11 0827  . testosterone cypionate (DEPOTESTOTERONE CYPIONATE) injection 200 mg  200 mg Intramuscular Q28 days Elvina Sidle, MD   200 mg at 08/09/11 1140  . testosterone cypionate (DEPOTESTOTERONE CYPIONATE) injection 200 mg  200 mg Intramuscular Q14 Days Elvina Sidle, MD        Review of Systems:  As per HPI- otherwise negative.   Physical Examination: Filed Vitals:   06/02/12 0806  BP: 105/62  Pulse: 88  Temp: 98 F (36.7 C)  Resp: 16   Filed Vitals:   06/02/12 0806  Height: 5\' 8"  (1.727 m)  Weight: 196 lb 6.4 oz (89.086 kg)   Body mass index is 29.87 kg/(m^2). Ideal Body Weight: Weight in (lb) to have BMI = 25: 164.1  GEN: WDWN, NAD, Non-toxic, A & O x 3, overweight HEENT: Atraumatic, Normocephalic. Neck supple. No masses, No LAD.  Bilateral TM wnl, oropharynx normal.  PEERL,EOMI.   Ears and Nose: No external deformity. CV: RRR, No M/G/R. No JVD. No thrill. No extra heart sounds. PULM: CTA B, no wheezes, crackles, rhonchi. No retractions. No resp. distress. No accessory muscle use. ABD: S,  ND, +BS. No rebound. No  HSM.  Minimal TTP, mostly in the epigastric and LUQ EXTR: No c/c/e NEURO Normal gait.  PSYCH: Normally interactive. Conversant. Not depressed or anxious appearing.  Calm demeanor.   Results for orders placed in visit on 06/02/12  POCT CBC      Result Value Range   WBC 9.1  4.6 -  10.2 K/uL   Lymph, poc 2.7  0.6 - 3.4   POC LYMPH PERCENT 29.5  10 - 50 %L   MID (cbc) 0.7  0 - 0.9   POC MID % 7.5  0 - 12 %M   POC Granulocyte 5.7  2 - 6.9   Granulocyte percent 63.0  37 - 80 %G   RBC 4.52 (*) 4.69 - 6.13 M/uL   Hemoglobin 13.1 (*) 14.1 - 18.1 g/dL   HCT, POC 16.1 (*) 09.6 - 53.7 %   MCV 90.4  80 - 97 fL   MCH, POC 29.0  27 - 31.2 pg   MCHC 32.0  31.8 - 35.4 g/dL   RDW, POC 04.5     Platelet Count, POC 349  142 - 424 K/uL   MPV 8.6  0 - 99.8 fL  GLUCOSE, POCT (MANUAL RESULT ENTRY)      Result Value Range   POC Glucose 180 (*) 70 - 99 mg/dl   Creatinine clearance = 47, ok to use omnicef regular doseage  Assessment and Plan: Abdominal  pain, other specified site - Plan: POCT CBC, Comprehensive metabolic panel  Pancreatitis, acute - Plan: Amylase, Lipase  Type II or unspecified type diabetes mellitus without mention of complication, not stated as uncontrolled - Plan: POCT glucose (manual entry)  Cough - Plan: cefdinir (OMNICEF) 300 MG capsule  Diagnosed with pancreatitis recently- clinically better, await pancreatic enzyme levels DM- poorly controlled.  Expect that he will continue to get better Will need to follow- up mild anemia. Will plan further follow- up pending labs.   Abbe Amsterdam, MD

## 2012-06-02 NOTE — Patient Instructions (Addendum)
Please use the antibiotic as directed for your cough and congestion.  If you are not better in the next few days please let me know.  I am going to review your records from your recent hospital stay, and will be in touch regarding the rest of your labs from today. If you start to get worse again please let us know, and be sure to keep your follow- up appointments.

## 2012-06-03 ENCOUNTER — Telehealth: Payer: Self-pay | Admitting: Family Medicine

## 2012-06-03 ENCOUNTER — Encounter: Payer: Self-pay | Admitting: Family Medicine

## 2012-06-03 NOTE — Telephone Encounter (Signed)
Called to go over his labs from yesterday- spoke to his wife and let her know his pancreatic enzymes are now normal.  Still needs to go ahead with follow- up, but this is a good sign

## 2012-06-18 ENCOUNTER — Ambulatory Visit (INDEPENDENT_AMBULATORY_CARE_PROVIDER_SITE_OTHER): Payer: Medicare Other | Admitting: Emergency Medicine

## 2012-06-18 VITALS — BP 120/64 | HR 83 | Temp 97.8°F | Resp 16 | Ht 72.0 in | Wt 196.0 lb

## 2012-06-18 DIAGNOSIS — Z7901 Long term (current) use of anticoagulants: Secondary | ICD-10-CM

## 2012-06-18 DIAGNOSIS — G459 Transient cerebral ischemic attack, unspecified: Secondary | ICD-10-CM

## 2012-06-18 DIAGNOSIS — K859 Acute pancreatitis without necrosis or infection, unspecified: Secondary | ICD-10-CM

## 2012-06-18 DIAGNOSIS — R1013 Epigastric pain: Secondary | ICD-10-CM

## 2012-06-18 NOTE — Progress Notes (Signed)
  Subjective:    Patient ID: Justin [name], male    DOB: 08/17/1934, 78 y.o.   MRN: [id number]  HPI patient enters for evaluation of discontinuance of Plavix following a recent hospitalization with pancreatitis. He is scheduled to have an endoscopic ultrasound. They would like to discontinue his Plavix for the procedure. Patient states she was started on Plavix 7 years ago after a TIA. He had a CT of the head 2 years ago and this revealed small vessel disease. He had carotid Dopplers done 2 years ago and this revealed no significant obstruction. He does have a history of coronary artery disease. Patient had a nuclear stress test in June 2012 he an EF of 62% without evidence of ischemia.    Review of Systems     Objective:   Physical Exam patient is alert and cooperative. Carotids are without bruits cardiac is regular rate without murmurs abdomen is soft nontender         Assessment & Plan:  I do feel would be safe to stop his Plavix one week prior to this procedure. I did discuss this with Dr. love a neurologist at the hospital who agreed since he has had no further episodes of TIA or 7 years but it would be safe for him to stop the medication for a short time.

## 2012-06-25 DIAGNOSIS — K862 Cyst of pancreas: Secondary | ICD-10-CM | POA: Insufficient documentation

## 2012-06-27 ENCOUNTER — Other Ambulatory Visit: Payer: Self-pay | Admitting: Physician Assistant

## 2012-06-30 ENCOUNTER — Telehealth: Payer: Self-pay | Admitting: Radiology

## 2012-06-30 NOTE — Telephone Encounter (Signed)
Please have Justin [name] come in tomorrow around 2:30 pm (Tuesday 25 March) so I can review his preop condition with him.

## 2012-06-30 NOTE — Telephone Encounter (Signed)
[facility] called, patient will have surgery. Needs clearance. Advised him to come in for this, if he can not get appt soon enough, he can come to urgent care. To you FYI> he has also recently been hospitalized.

## 2012-07-01 ENCOUNTER — Ambulatory Visit (INDEPENDENT_AMBULATORY_CARE_PROVIDER_SITE_OTHER): Payer: Medicare Other | Admitting: Family Medicine

## 2012-07-01 VITALS — BP 112/64 | HR 88 | Temp 98.0°F | Resp 16 | Ht 72.0 in | Wt 195.0 lb

## 2012-07-01 DIAGNOSIS — Z01810 Encounter for preprocedural cardiovascular examination: Secondary | ICD-10-CM

## 2012-07-01 DIAGNOSIS — E785 Hyperlipidemia, unspecified: Secondary | ICD-10-CM

## 2012-07-01 DIAGNOSIS — E291 Testicular hypofunction: Secondary | ICD-10-CM

## 2012-07-01 DIAGNOSIS — IMO0001 Reserved for inherently not codable concepts without codable children: Secondary | ICD-10-CM

## 2012-07-01 DIAGNOSIS — Z8673 Personal history of transient ischemic attack (TIA), and cerebral infarction without residual deficits: Secondary | ICD-10-CM

## 2012-07-01 DIAGNOSIS — Z8719 Personal history of other diseases of the digestive system: Secondary | ICD-10-CM

## 2012-07-01 DIAGNOSIS — I10 Essential (primary) hypertension: Secondary | ICD-10-CM

## 2012-07-01 DIAGNOSIS — E349 Endocrine disorder, unspecified: Secondary | ICD-10-CM

## 2012-07-01 DIAGNOSIS — E119 Type 2 diabetes mellitus without complications: Secondary | ICD-10-CM

## 2012-07-01 LAB — POCT GLYCOSYLATED HEMOGLOBIN (HGB A1C): Hemoglobin A1C: 10.5

## 2012-07-01 LAB — POCT CBC
Granulocyte percent: 61.4 %G (ref 37–80)
HCT, POC: 39.6 % — AB (ref 43.5–53.7)
MCV: 89.9 fL (ref 80–97)
MID (cbc): 0.8 (ref 0–0.9)
Platelet Count, POC: 325 10*3/uL (ref 142–424)
RBC: 4.4 M/uL — AB (ref 4.69–6.13)

## 2012-07-01 LAB — POCT URINALYSIS DIPSTICK
Bilirubin, UA: NEGATIVE
Blood, UA: NEGATIVE
Glucose, UA: 500
Leukocytes, UA: NEGATIVE
Nitrite, UA: NEGATIVE

## 2012-07-01 LAB — BASIC METABOLIC PANEL
BUN: 22 mg/dL (ref 6–23)
Chloride: 96 mEq/L (ref 96–112)
Potassium: 4.6 mEq/L (ref 3.5–5.3)
Sodium: 132 mEq/L — ABNORMAL LOW (ref 135–145)

## 2012-07-01 LAB — POCT UA - MICROSCOPIC ONLY: Yeast, UA: NEGATIVE

## 2012-07-01 LAB — GLUCOSE, POCT (MANUAL RESULT ENTRY): POC Glucose: 265 mg/dl — AB (ref 70–99)

## 2012-07-01 NOTE — Progress Notes (Signed)
Preop examination  History: Patient has a long history of a cataract on his right eye. He is rating get surgery done later this week. He was referred here by the [facility] to have a preoperative evaluation done. He is been a patient in this practice for some time.  Past medical history: Patient has history of several surgeries in the past. A couple of years ago he broke both ankles with what sounds like compound fractures. He has had the pain since then. He has a history of a TIA about 7 years ago. He has done well from that. Recently had pancreatitis and was hospitalized at [facility] [place]. he has done well since then. he had the egd and they began him on some medicines for his stomach and is done well since then. 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 He is married and has 6 children  Review of systems: Constitutional: Unremarkable  HEENT: Vision is bad in his right eye. The left is good. Cardiovascular: No headaches or dizziness or or chest pain or palpitations Respiratory: Unremarkable GI: Pancreas is no longer bothering him nor is his stomach GU: Unremarkable Musculoskeletal: Ankle bother him and he has use a cane Endocrine: Diabetic, takes his medicines regularly, sugars are not always. Dermatologic: Unremarkable Neurologic: Unremarkable   Physical examination: Well-developed 78 year old man in no major distress at this time. TMs normal. Right fundus is not visible due to the cataract. Left red reflex normal. Throat clear.  Neck supple without nodes thyromegaly. No carotid bruits. Chest clear to auscultation. Heart regular without murmurs gallops or arrhythmias. And soft the mass or tenderness. Extremities do not have any edema at this time, though he apparently gets dependent edema due to the old fractures  Assessment: Preop examination History of diabetes History of TIA 7 years ago History of pancreatitis 3 weeks ago Hypertension Hyperlipidemia Hypo-testosteronism  Plan: Check EKG and a couple of basic blood tests before finishing this report.  EKG appears normal  Results for orders placed in visit on 07/01/12  POCT CBC      Result Value Range   WBC 8.7  4.6 - 10.2 K/uL   Lymph, poc 2.6  0.6 - 3.4   POC LYMPH PERCENT 29.7  10 - 50 %L   MID (cbc) 0.8  0 - 0.9   POC MID % 8.9  0 - 12 %M   POC Granulocyte 5.3  2 - 6.9   Granulocyte percent 61.4  37 - 80 %G   RBC 4.40 (*) 4.69 - 6.13 M/uL   Hemoglobin 12.7 (*) 14.1 - 18.1 g/dL   HCT, POC 16.1 (*) 09.6 - 53.7 %   MCV 89.9  80 - 97 fL   MCH, POC 28.9  27 - 31.2 pg   MCHC 32.1  31.8 - 35.4 g/dL   RDW, POC 04.5     Platelet Count, POC 325  142 - 424 K/uL   MPV 9.6  0 - 99.8 fL  GLUCOSE, POCT (MANUAL RESULT ENTRY)      Result Value Range   POC Glucose 265 (*) 70 -  99 mg/dl  POCT GLYCOSYLATED HEMOGLOBIN (HGB A1C)      Result Value Range   Hemoglobin A1C 10.5    POCT UA - MICROSCOPIC ONLY      Result Value Range   WBC, Ur, HPF, POC 1-10 with clums     RBC, urine, microscopic 0-1     Bacteria, U Microscopic trace     Mucus, UA trace     Epithelial cells, urine per micros 0-1     Crystals, Ur, HPF, POC neg     Casts, Ur, LPF, POC neg     Yeast, UA neg    POCT URINALYSIS DIPSTICK      Result Value Range   Color, UA yellow     Clarity, UA clear     Glucose, UA 500     Bilirubin, UA neg     Ketones, UA neg     Spec Grav, UA 1.020     Blood, UA neg     pH, UA 5.0     Protein, UA neg     Urobilinogen, UA 0.2     Nitrite, UA neg      Leukocytes, UA Negative     Discussed with the patient that his diabetes is still not well controlled. Advised him to followup in about one month with his regular doctor, Dr. Faustino Congress, and discuss increasing the insulin dose. However I'm not going to make a change this week prior to his surgery. He is cleared for the cataract surgery. Advise him to not take the Plavix between now and surgery, but to resume postop.

## 2012-07-01 NOTE — Patient Instructions (Signed)
You are given clearance for the cataract surgery.  Return in one month to discuss the management of your diabetes further with Dr. Milus Glazier.

## 2012-07-01 NOTE — Telephone Encounter (Signed)
Patient came in early this am, and was seen by Dr Cleta Alberts.

## 2012-07-03 ENCOUNTER — Telehealth: Payer: Self-pay | Admitting: Radiology

## 2012-07-03 DIAGNOSIS — E1065 Type 1 diabetes mellitus with hyperglycemia: Secondary | ICD-10-CM

## 2012-07-03 MED ORDER — INSULIN GLARGINE 100 UNIT/ML ~~LOC~~ SOLN: 60.0000 [IU] | Freq: Every day | SUBCUTANEOUS | Status: DC

## 2012-07-03 NOTE — Telephone Encounter (Signed)
Increased dose sent in for him.

## 2012-07-03 NOTE — Telephone Encounter (Signed)
Left message for him,see labs, he is to increase his insulin to 60 units/day.

## 2012-07-07 ENCOUNTER — Other Ambulatory Visit: Payer: Self-pay | Admitting: Family Medicine

## 2012-07-07 ENCOUNTER — Telehealth: Payer: Self-pay | Admitting: *Deleted

## 2012-07-07 DIAGNOSIS — E291 Testicular hypofunction: Secondary | ICD-10-CM

## 2012-07-07 MED ORDER — TESTOSTERONE CYPIONATE 100 MG/ML IM SOLN: 200.0000 mg | INTRAMUSCULAR | Status: DC

## 2012-07-07 NOTE — Telephone Encounter (Signed)
Pt request a refill on testosterone.    [employer]

## 2012-07-24 ENCOUNTER — Other Ambulatory Visit: Payer: Self-pay

## 2012-07-24 DIAGNOSIS — E1065 Type 1 diabetes mellitus with hyperglycemia: Secondary | ICD-10-CM

## 2012-07-24 DIAGNOSIS — E785 Hyperlipidemia, unspecified: Secondary | ICD-10-CM

## 2012-07-24 MED ORDER — SIMVASTATIN 40 MG PO TABS: 40.0000 mg | ORAL_TABLET | Freq: Every day | ORAL | Status: DC

## 2012-08-16 ENCOUNTER — Ambulatory Visit (INDEPENDENT_AMBULATORY_CARE_PROVIDER_SITE_OTHER): Payer: Medicare Other | Admitting: Family Medicine

## 2012-08-16 VITALS — BP 105/65 | HR 80 | Temp 98.6°F | Resp 18 | Wt 203.0 lb

## 2012-08-16 DIAGNOSIS — K859 Acute pancreatitis without necrosis or infection, unspecified: Secondary | ICD-10-CM

## 2012-08-16 LAB — AMYLASE: Amylase: 47 U/L (ref 0–105)

## 2012-08-16 LAB — LIPASE: Lipase: 54 U/L (ref 0–75)

## 2012-08-16 MED ORDER — PANCRELIPASE (LIP-PROT-AMYL) 10500 UNITS PO CPEP: 2.0000 | ORAL_CAPSULE | Freq: Three times a day (TID) | ORAL | Status: DC

## 2012-08-16 NOTE — Progress Notes (Signed)
78 yo man with 1year of chronic pancreatitis presents for follow up of chronic abdominal pain in the right upper quadrant.  His blood sugars have been under 150.  Bowels working well. No nausea or vomiting.  Appetite is good.  No heartburn.  Goes to [facility] for catar[facility].  Chronic fatigue.  Notes numbness in feet and sensitivity of lower legs to pressure  Now taking 50 units of insulin.  Diabetes began 30-40 years ago.  Objective:  NAD, alert, appropriate HEENT:  Unremarkable Chest:  Faint expiratory wheeze, barrel chest Heart:  Regular without gallop or murmur Abdomen:  Mild fullness RUQ with mild pain on palpation of RUQ and epigastrium.  No masses  Ext:  Some 1+ pretibial edema, early Heberden's nodes  CT and MRI done last February show no evidence of renal calculi, mild swelling of the pancreatic head with surrounding inflammation but no evidence of pancreatic mass or pseudocyst formation  Assessment: Chronic pancreatitis with no exacerbation recently  Plan: Check amylase and lipase Add Pancrease 2 tablets 3 times a day with meals  Signed, Sheila Oats.D.

## 2012-08-26 ENCOUNTER — Telehealth: Payer: Self-pay

## 2012-08-26 NOTE — Telephone Encounter (Signed)
Pharm sent PA request for Pancreaze. I completed form and received a denial because pt has not tried either of the preferred meds. Dr Milus Glazier authorized change to Creon.  Dr Milus Glazier, Creon comes in several different strengths. I have pended the closest one on the list to your original Pancreaze Rx w/same sig as you wrote for Pancreaze. Please check to see if this is the Rx you want for pt and sign if agreed or change if not. Thank you.

## 2012-08-27 ENCOUNTER — Telehealth: Payer: Self-pay

## 2012-08-27 NOTE — Telephone Encounter (Signed)
Will forward to Dr. Elbert Ewings for suggestions on an alternative

## 2012-08-27 NOTE — Telephone Encounter (Signed)
Is there anything else that may be more cost effective?

## 2012-08-27 NOTE — Telephone Encounter (Signed)
Sorry, routed to Dr L, but was trying to send to Britta Mccreedy, resent to her.

## 2012-08-27 NOTE — Telephone Encounter (Signed)
Pt's wife is calling about the prescription Pancrellpase that her husband was put on.  She states that its going cost close to $1,000 for the prescription. She is wanting someone to call her back, she is on the HIPPA form Call back number is [phone]

## 2012-08-27 NOTE — Telephone Encounter (Signed)
Creon  2 tabs tid with meals is fine.  thanks

## 2012-08-28 MED ORDER — PANCRELIPASE (LIP-PROT-AMYL) 12000-38000 UNITS PO CPEP: 2.0000 | ORAL_CAPSULE | Freq: Three times a day (TID) | ORAL | Status: DC

## 2012-08-29 MED ORDER — PANCRELIPASE (LIP-PROT-AMYL) 5000 UNITS PO CPEP: 1.0000 | ORAL_CAPSULE | Freq: Every day | ORAL | Status: DC

## 2012-08-29 NOTE — Telephone Encounter (Signed)
Pancrelipase 5000 units once daily with largest meal #30, 11 refills costs around $30 a month.  Please call this in for patient

## 2012-08-29 NOTE — Addendum Note (Signed)
Addended byCaffie Damme on: 08/29/2012 09:01 AM   Modules accepted: Orders

## 2012-08-29 NOTE — Telephone Encounter (Signed)
Sent in called wife to advise.  

## 2012-09-09 ENCOUNTER — Ambulatory Visit: Payer: Medicare Other

## 2012-09-09 ENCOUNTER — Ambulatory Visit (INDEPENDENT_AMBULATORY_CARE_PROVIDER_SITE_OTHER): Payer: Medicare Other | Admitting: Family Medicine

## 2012-09-09 VITALS — BP 114/70 | HR 113 | Temp 97.6°F | Resp 16 | Ht 72.0 in | Wt 207.2 lb

## 2012-09-09 DIAGNOSIS — H43399 Other vitreous opacities, unspecified eye: Secondary | ICD-10-CM

## 2012-09-09 DIAGNOSIS — H43393 Other vitreous opacities, bilateral: Secondary | ICD-10-CM

## 2012-09-09 DIAGNOSIS — Z139 Encounter for screening, unspecified: Secondary | ICD-10-CM

## 2012-09-09 DIAGNOSIS — D649 Anemia, unspecified: Secondary | ICD-10-CM

## 2012-09-09 DIAGNOSIS — R5383 Other fatigue: Secondary | ICD-10-CM

## 2012-09-09 DIAGNOSIS — E119 Type 2 diabetes mellitus without complications: Secondary | ICD-10-CM

## 2012-09-09 LAB — COMPREHENSIVE METABOLIC PANEL
Albumin: 4.4 g/dL (ref 3.5–5.2)
Alkaline Phosphatase: 77 U/L (ref 39–117)
BUN: 16 mg/dL (ref 6–23)
CO2: 27 mEq/L (ref 19–32)
Calcium: 9.7 mg/dL (ref 8.4–10.5)
Chloride: 97 mEq/L (ref 96–112)
Glucose, Bld: 173 mg/dL — ABNORMAL HIGH (ref 70–99)
Potassium: 4.3 mEq/L (ref 3.5–5.3)
Sodium: 133 mEq/L — ABNORMAL LOW (ref 135–145)
Total Protein: 8.1 g/dL (ref 6.0–8.3)

## 2012-09-09 LAB — POCT CBC
Granulocyte percent: 64.6 %G (ref 37–80)
Hemoglobin: 11.7 g/dL — AB (ref 14.1–18.1)
MCH, POC: 29.5 pg (ref 27–31.2)
MCV: 94.5 fL (ref 80–97)
Platelet Count, POC: 300 10*3/uL (ref 142–424)
RBC: 3.96 M/uL — AB (ref 4.69–6.13)
WBC: 7.7 10*3/uL (ref 4.6–10.2)

## 2012-09-09 LAB — FERRITIN: Ferritin: 76 ng/mL (ref 22–322)

## 2012-09-09 LAB — GLUCOSE, POCT (MANUAL RESULT ENTRY): POC Glucose: 178 mg/dl — AB (ref 70–99)

## 2012-09-09 IMAGING — CR DG CHEST 2V
2 series · 2 of 2 positions shown · non-contrast
Comparison: [DATE].

CLINICAL DATA: fatigue.

CHEST - 2 VIEW

[PA]
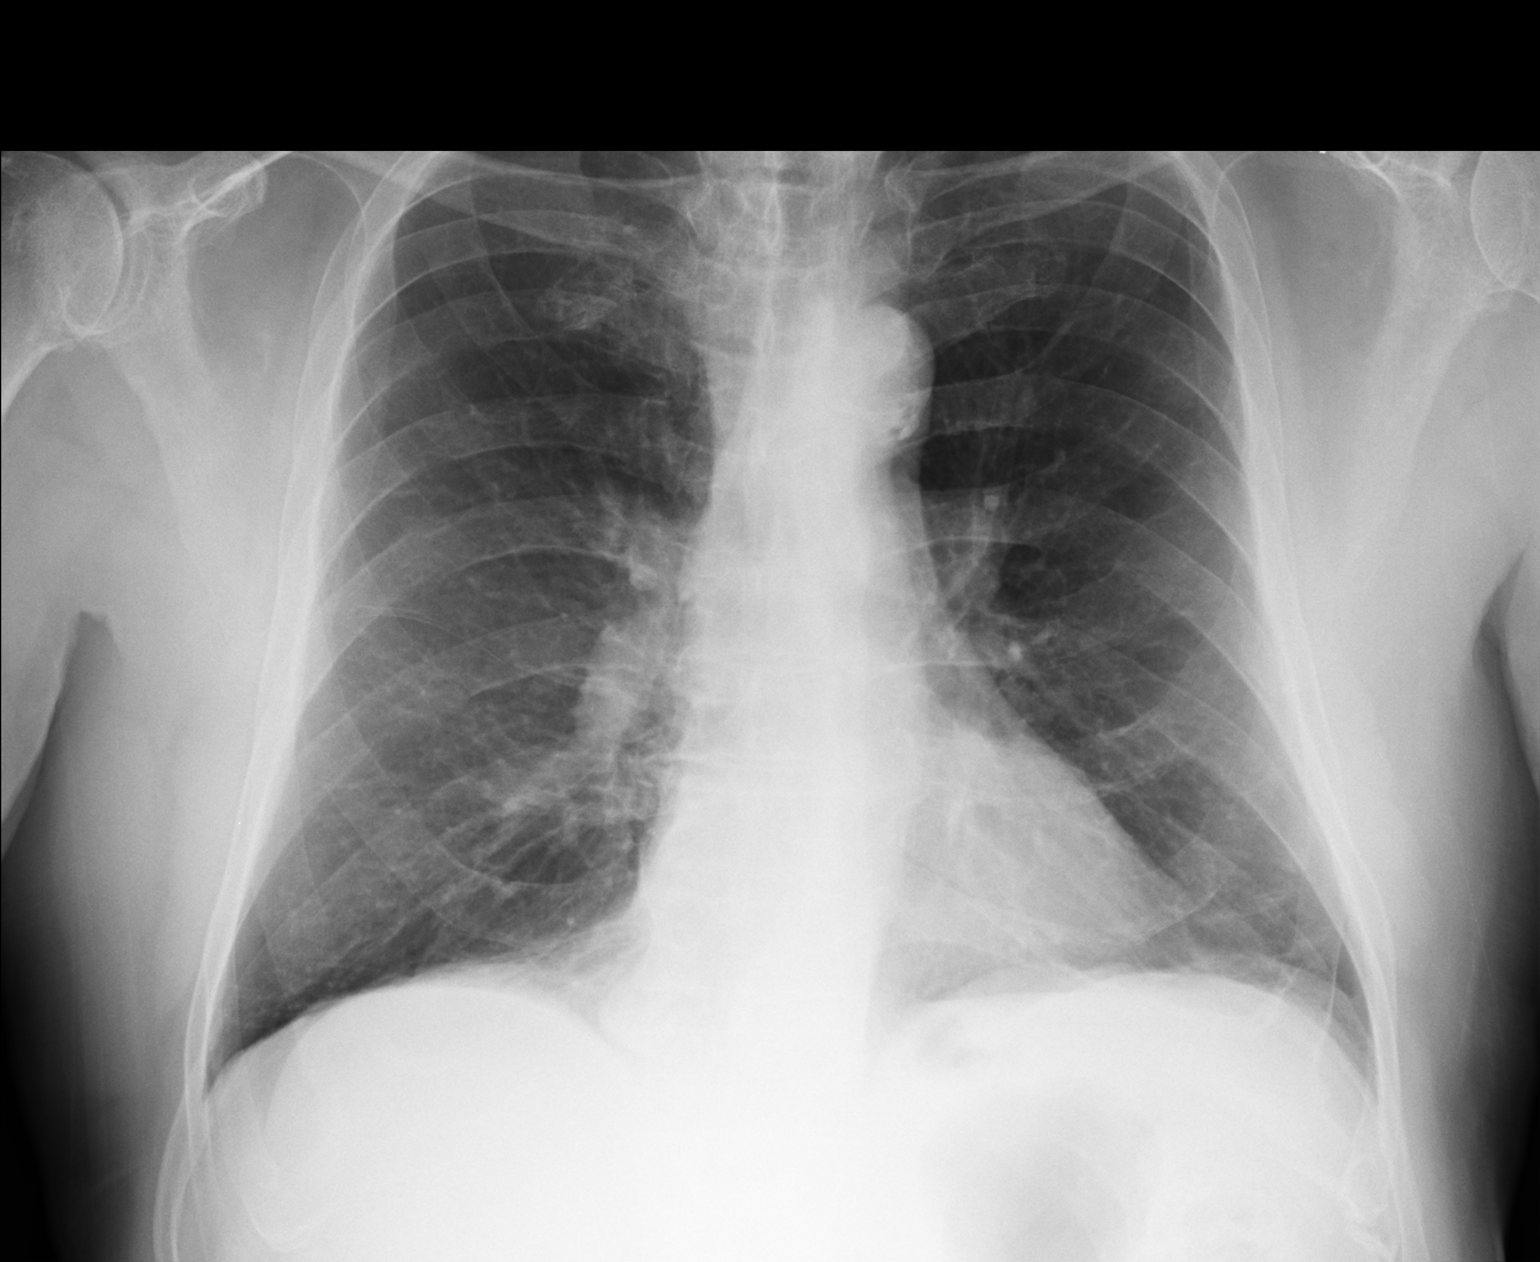

[lateral]
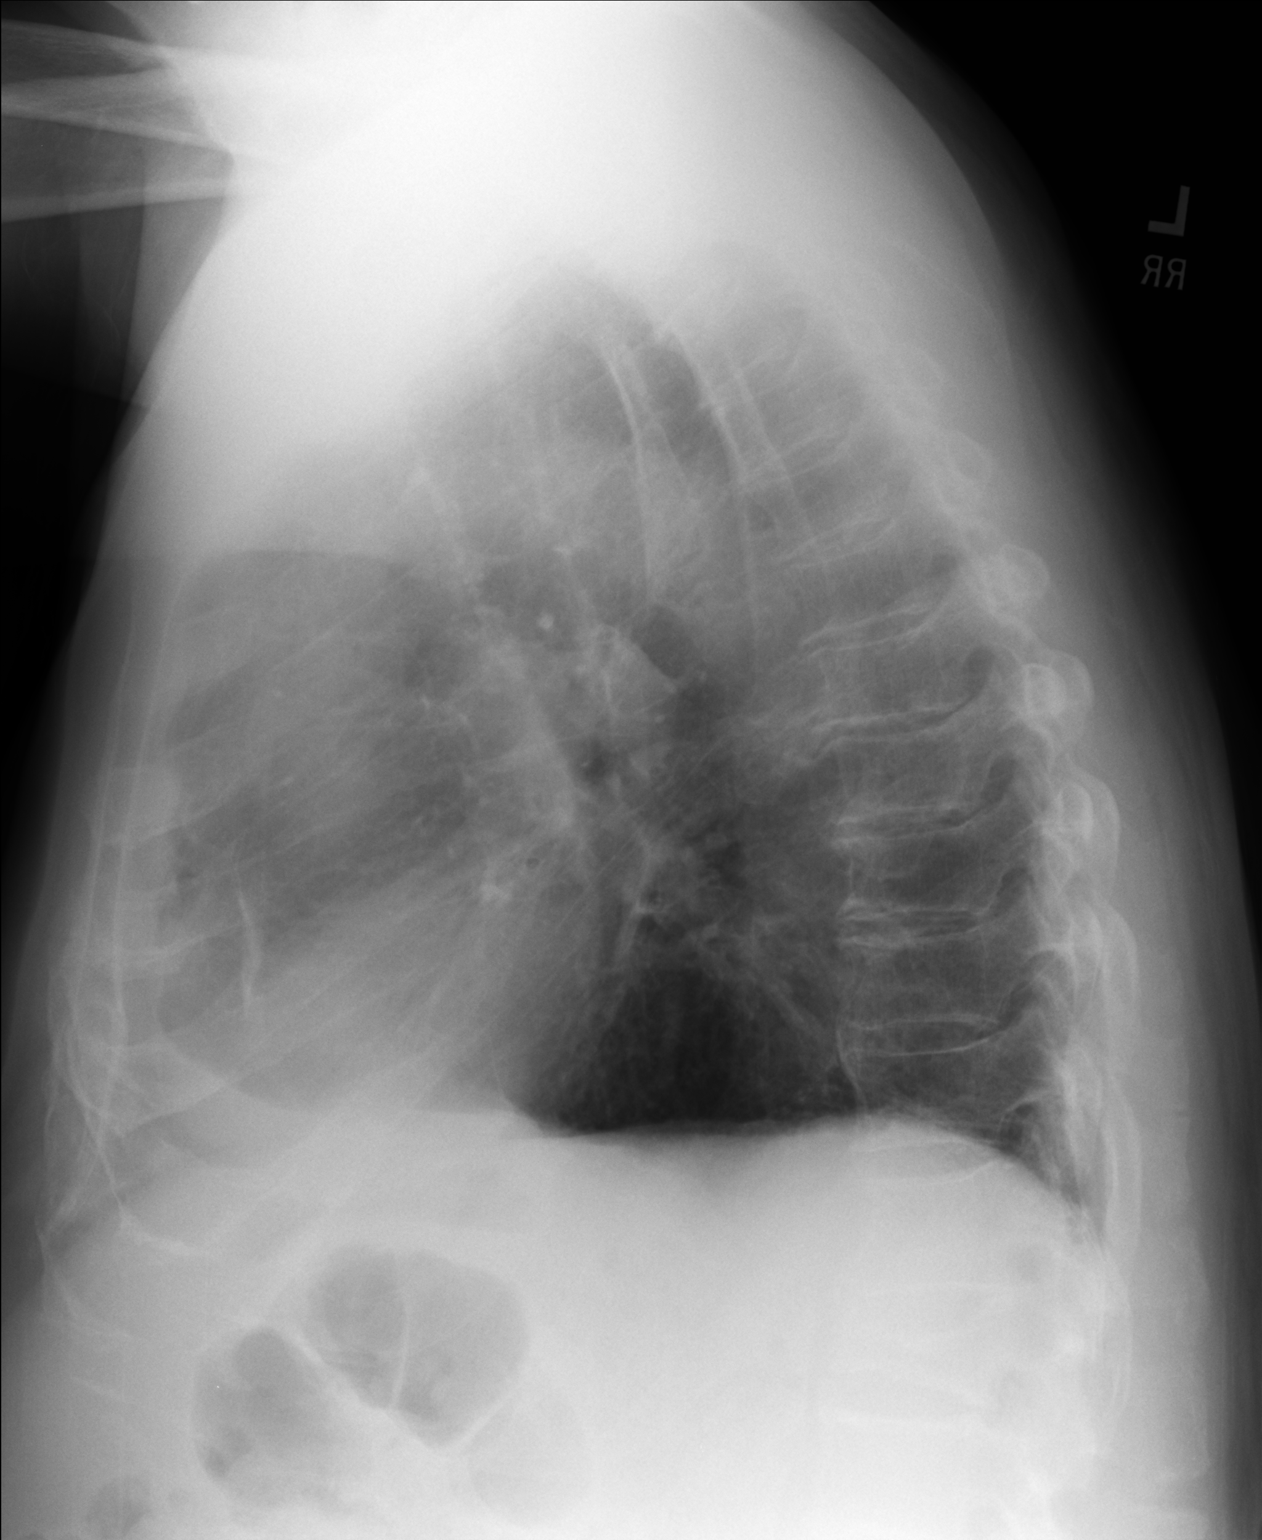

[2 of 2 positions shown; findings below may reference images not displayed]

FINDINGS: Cardiomediastinal silhouette appears normal.  No acute
pulmonary disease is noted.  Bony thorax is intact.  Old L1
vertebral body compression fracture is again noted and unchanged.
IMPRESSION: No acute cardiopulmonary abnormality seen.

Clinically significant discrepancy from primary report, if
provided: None

## 2012-09-09 NOTE — Patient Instructions (Addendum)
I will be in touch with the rest of your labs.  We need to find out why you are anemic because this may be the cause of your fatigue.    You have an appt with your eye doctor at [facility] today at 1:25 pm- remember they are located at [facility].  [phone].  They will look at your eyes and try to find out why you are having floaters.  Your diabetes control is not as good as it could be.  Please work on your diet, and check your sugar each morning and write it down.  Let's recheck this in about one month

## 2012-09-09 NOTE — Progress Notes (Addendum)
[facility] [address], [place] [address] [phone]  Date:  09/09/2012   Name:  Justin [name]   DOB:  07-16-1934   MRN:  [id number]  PCP:  Abbe Amsterdam, MD    Chief Complaint: Spots and/or Floaters and Fatigue   History of Present Illness:  Justin [name] is a 78 y.o. very pleasant male patient who presents with the following:  Last seen by myself in February of this year to follow- up a hospitalization due to pancreatitis.   He was last seen here last month and had a recheck of his pancreatic enzymes- they were normal.    He is here today due to "getting tired easily" for th last 2 or 3 weeks.  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  After he gets home at night he feels tired and just wants to rest. No CP, no SOB, he is eating ok, no digestion problems.  No orthopnea.   He does note that sometimes his feet and legs will swell, but this has been the case since a WC injury 2 years ago.   He would like for me to refer him to ortho for a second opinion regarding this problem  He notes that he had a cold last week- he is better but still has a mild cough.  No abdominal pain- he is feeling better since he started the pancrease enzyme medication.    No nausea or vomiting.  He has not noted any blood in his stool or any black stools.  He is not sure of the date of his last colonoscopy.    He also notes "little black spots in front of my eyes" for the last couple of weeks.  The spots seem to move around, and they come and go.  They are present in both eyes as far as he can tell.  He had surgery on his right eye last month to remove a cataract.  He goes to [facility] center.  No eye pain.  His vision is otherwise ok.   Patient Active Problem List   Diagnosis Date Noted  . Pancreatitis, acute 06/02/2012  . Testosterone deficiency 10/22/2011  . Headache above the eye region 02/22/2011  . Diabetes mellitus type 1 02/22/2011  .  TIA on medication 02/22/2011  . Coronary artery disease 02/22/2011    Past Medical History  Diagnosis Date  . HTN (hypertension)   . Diabetes mellitus   . Hypercholesteremia   . TIA (transient ischemic attack)   . Iron deficiency   . Arthritis   . Stroke     Past Surgical History  Procedure Laterality Date  . Back surgery  1985  . Neck surgery  1985  . Temporal artery biopsy / ligation  02/26/2011    left side  . Artery biopsy  02/26/2011    Procedure: BIOPSY TEMPORAL ARTERY;  Surgeon: Iona Coach, MD;  Location: [facility];  Service: General;  Laterality: Left;  Left temporal artery biospy    History  Substance Use Topics  . Smoking status: Former Smoker -- 35 years    Types: Cigarettes    Quit date: 04/09/1990  . Smokeless tobacco: Never Used  . Alcohol Use: No     Comment: "per pt stopped drinking in the 90's"    Family History  Problem Relation Age of Onset  . Heart disease Mother     No Known Allergies  Medication list has been  reviewed and updated.  Current Outpatient Prescriptions on File Prior to Visit  Medication Sig Dispense Refill  . clopidogrel (PLAVIX) 75 MG tablet Take 1 tablet (75 mg total) by mouth daily.  30 tablet  10  . fish oil-omega-3 fatty acids 1000 MG capsule Take 2 g by mouth daily.        . Fluticasone-Salmeterol (ADVAIR) 250-50 MCG/DOSE AEPB Inhale 1 puff into the lungs as needed.       . furosemide (LASIX) 20 MG tablet TAKE ONE TABLET BY MOUTH EVERY DAY  30 tablet  2  . glucose blood test strip Use as instructed  100 each  2  . insulin glargine (LANTUS SOLOSTAR) 100 UNIT/ML injection Inject 0.6 mLs (60 Units total) into the skin at bedtime.  10 pen  10  . Lancets MISC To check blood glucose tid  100 each  5  . lisinopril-hydrochlorothiazide (PRINZIDE,ZESTORETIC) 10-12.5 MG per tablet Take 1 tablet by mouth daily.  90 tablet  3  . metFORMIN (GLUCOPHAGE) 1000 MG tablet Take 1 tablet (1,000 mg total) by mouth 2  (two) times daily with a meal.  180 tablet  3  . Multiple Vitamins-Minerals (MULTIVITAMIN WITH MINERALS) tablet Take 1 tablet by mouth daily.        Marland Kitchen omeprazole (PRILOSEC) 20 MG capsule Take 1 capsule (20 mg total) by mouth daily.  90 capsule  3  . Pancrelipase, Lip-Prot-Amyl, 10500 UNITS CPEP Take 2 capsules (21,000 Units total) by mouth 3 x daily with food.  540 capsule  6  . Pancrelipase, Lip-Prot-Amyl, 5000 UNITS CPEP Take 1 capsule (5,000 Units total) by mouth daily.  30 capsule  11  . simvastatin (ZOCOR) 40 MG tablet Take 1 tablet (40 mg total) by mouth at bedtime.  90 tablet  1  . Vitamin D, Ergocalciferol, (DRISDOL) 50000 UNITS CAPS Take 50,000 Units by mouth.        Marland Kitchen l-methylfolate-B6-B12 (METANX) 3-35-2 MG TABS Take 1 tablet by mouth daily.        . lipase/protease/amylase (CREON-12/PANCREASE) 12000 UNITS CPEP Take 2 capsules by mouth 3 (three) times daily with meals.  540 capsule  1  . [DISCONTINUED] testosterone cypionate (DEPOTESTOTERONE CYPIONATE) 200 MG/ML injection Inject into the muscle every 14 (fourteen) days.       Current Facility-Administered Medications on File Prior to Visit  Medication Dose Route Frequency Provider Last Rate Last Dose  . testosterone cypionate (DEPOTESTOTERONE CYPIONATE) injection 200 mg  200 mg Intramuscular Q14 Days Elvina Sidle, MD   200 mg at 09/13/11 0827  . testosterone cypionate (DEPOTESTOTERONE CYPIONATE) injection 200 mg  200 mg Intramuscular Q28 days Elvina Sidle, MD   200 mg at 08/09/11 1140  . testosterone cypionate (DEPOTESTOTERONE CYPIONATE) injection 200 mg  200 mg Intramuscular Q14 Days Elvina Sidle, MD      . testosterone cypionate (DEPOTESTOTERONE CYPIONATE) injection 200 mg  200 mg Intramuscular Q14 Days Elvina Sidle, MD        Review of Systems:  As per HPI- otherwise negative.   Physical Examination: Filed Vitals:   09/09/12 0749  BP: 114/70  Pulse: 113  Temp: 97.6 F (36.4 C)  Resp: 16   Filed Vitals:    09/09/12 0749  Height: 6' (1.829 m)  Weight: 207 lb 3.2 oz (93.985 kg)   Body mass index is 28.1 kg/(m^2). Ideal Body Weight: Weight in (lb) to have BMI = 25: 183.9  GEN: WDWN, NAD, Non-toxic, A & O x 3, looks well HEENT: Atraumatic, Normocephalic. Neck  supple. No masses, No LAD.  Bilateral TM wnl, oropharynx normal.  PEERL,EOMI.   Fundoscopic exam- wnl.   Ears and Nose: No external deformity. CV: RRR, No M/G/R. No JVD. No thrill. No extra heart sounds. PULM: CTA B, no wheezes, crackles, rhonchi. No retractions. No resp. distress. No accessory muscle use. ABD: S, NT, ND, +BS. No rebound. No HSM. EXTR: No c/c/e NEURO Normal gait.  PSYCH: Normally interactive. Conversant. Not depressed or anxious appearing.  Calm demeanor.  Uses a cane- baseline.  History of back surgery.    EKG: NSR, so ST elevation or depression.  Rate normal  [facility] reading (PRIMARY) by  Dr. Patsy Lager. CXR: negative  CHEST - 2 VIEW  Comparison: October 20, 2011.  Findings: Cardiomediastinal silhouette appears normal. No acute pulmonary disease is noted. Bony thorax is intact. Old L1 vertebral body compression fracture is again noted and unchanged.  IMPRESSION: No acute cardiopulmonary abnormality seen.  Clinically significant discrepancy from primary report, if provided: None   Results for orders placed in visit on 09/09/12  POCT GLYCOSYLATED HEMOGLOBIN (HGB A1C)      Result Value Range   Hemoglobin A1C 9.4    GLUCOSE, POCT (MANUAL RESULT ENTRY)      Result Value Range   POC Glucose 178 (*) 70 - 99 mg/dl  POCT CBC      Result Value Range   WBC 7.7  4.6 - 10.2 K/uL   Lymph, poc 2.1  0.6 - 3.4   POC LYMPH PERCENT 27.1  10 - 50 %L   MID (cbc) 0.6  0 - 0.9   POC MID % 8.3  0 - 12 %M   POC Granulocyte 5.0  2 - 6.9   Granulocyte percent 64.6  37 - 80 %G   RBC 3.96 (*) 4.69 - 6.13 M/uL   Hemoglobin 11.7 (*) 14.1 - 18.1 g/dL   HCT, POC 40.9 (*) 81.1 - 53.7 %   MCV 94.5  80 - 97 fL   MCH, POC 29.5  27 -  31.2 pg   MCHC 31.3 (*) 31.8 - 35.4 g/dL   RDW, POC 91.4     Platelet Count, POC 300  142 - 424 K/uL   MPV 9.3  0 - 99.8 fL  IFOBT (OCCULT BLOOD)      Result Value Range   IFOBT Negative      Hemoglobin has gone from 14.6 in February to 11.7 today.  He is unsure of the date of his last colonoscopy Assessment and Plan: Fatigue - Plan: EKG 12-Lead, POCT glycosylated hemoglobin (Hb A1C), POCT glucose (manual entry), Amylase, Lipase, Comprehensive metabolic panel, TSH, Testosterone, free, total, DG Chest 2 View, POCT CBC  Diabetes - Plan: POCT glycosylated hemoglobin (Hb A1C), POCT glucose (manual entry), POCT CBC  Anemia - Plan: IFOBT POC (occult bld, rslt in office), Ferritin  Vitreous floaters of both eyes   Floaters in eyes-called his optho and they will see him today Diabetes; he is taking lantus 50 units- his medication says 60 units.  A1c is high.  Recommended that he check his glucose in the morning, and as long as it is more than 150 he can increase his lantus by 2 units every few days until he reaches 60units again Fatigue: many possible causes.  His anemia is certainly concerning.  Await other labs, and ferritin level.  Will need a colonoscopy.    Signed Abbe Amsterdam, MD  Called to go over labs- no answer at home, [facility]  Cell  number did not work   09/14/12- tried to call again.  Still no answer.  LMOM, will go ahead and send a letter to him and refer to GI

## 2012-09-10 LAB — TESTOSTERONE, FREE, TOTAL, SHBG
Sex Hormone Binding: 48 nmol/L (ref 13–71)
Testosterone: 257 ng/dL — ABNORMAL LOW (ref 300–890)

## 2012-09-14 ENCOUNTER — Telehealth: Payer: Self-pay | Admitting: *Deleted

## 2012-09-14 DIAGNOSIS — M25569 Pain in unspecified knee: Secondary | ICD-10-CM

## 2012-09-14 NOTE — Addendum Note (Signed)
Addended by: Abbe Amsterdam C on: 09/14/2012 03:03 PM   Modules accepted: Orders

## 2012-09-14 NOTE — Telephone Encounter (Signed)
Patient is requesting an orthopedic referral for leg pain per Dr. Milus Glazier.

## 2012-09-27 ENCOUNTER — Ambulatory Visit (INDEPENDENT_AMBULATORY_CARE_PROVIDER_SITE_OTHER): Payer: Medicare Other | Admitting: Family Medicine

## 2012-09-27 VITALS — BP 112/55 | HR 77 | Temp 97.9°F | Resp 18 | Wt 209.0 lb

## 2012-09-27 DIAGNOSIS — K861 Other chronic pancreatitis: Secondary | ICD-10-CM

## 2012-09-27 DIAGNOSIS — R112 Nausea with vomiting, unspecified: Secondary | ICD-10-CM

## 2012-09-27 DIAGNOSIS — R61 Generalized hyperhidrosis: Secondary | ICD-10-CM

## 2012-09-27 DIAGNOSIS — IMO0001 Reserved for inherently not codable concepts without codable children: Secondary | ICD-10-CM

## 2012-09-27 DIAGNOSIS — D649 Anemia, unspecified: Secondary | ICD-10-CM

## 2012-09-27 LAB — POCT CBC
Hemoglobin: 11.6 g/dL — AB (ref 14.1–18.1)
Lymph, poc: 1.8 (ref 0.6–3.4)
MCH, POC: 29.2 pg (ref 27–31.2)
MCHC: 30.6 g/dL — AB (ref 31.8–35.4)
MCV: 95.5 fL (ref 80–97)
MID (cbc): 0.5 (ref 0–0.9)
MPV: 9.4 fL (ref 0–99.8)
POC MID %: 5.2 %M (ref 0–12)
Platelet Count, POC: 304 10*3/uL (ref 142–424)
RBC: 3.97 M/uL — AB (ref 4.69–6.13)
WBC: 10.3 10*3/uL — AB (ref 4.6–10.2)

## 2012-09-27 MED ORDER — ONDANSETRON 4 MG PO TBDP: 4.0000 mg | ORAL_TABLET | Freq: Three times a day (TID) | ORAL | Status: DC | PRN

## 2012-09-27 MED ORDER — INSULIN DETEMIR 100 UNIT/ML ~~LOC~~ SOLN: 50.0000 [IU] | Freq: Two times a day (BID) | SUBCUTANEOUS | Status: DC

## 2012-09-27 NOTE — Patient Instructions (Signed)
Use the nausea pills only if you have further vomiting.  I am not sure why you're anemic. I want you to return in September for a recheck on this. Sooner if problems arise  I do not know why the sweating spell last night. If you continue having does he should return. If you have a systolic that try to check your sugar probably it is happening.  Otherwise continue same medications.  I represcribed the insulin so I hope that it will make it lasts between prescriptions.

## 2012-09-27 NOTE — Progress Notes (Signed)
Subjective: 78, almost 78 year old man who is here with history of having felt fine yesterday. In the night last night he had sat up talking with his wife. He developed a severe diaphoresis, drinking with sweat all over. It is first time he does 9 years. He did not have any chest pain. He did get nauseated and vomited couple times. This morning he felt okay, and some french fries on the way here. He is not having any abdominal pains and no more vomiting. He checked his blood sugar this morning it was 130. He was recently told that he was somewhat anemic.  Objective: Pleasant alert gentleman in no distress at this time. Chest is clear to auscultation. Heart regular without any murmurs gallops or arrhythmias. Abdomen has normal bowel sounds, soft without organomegaly mass or tenderness.  Assessment: Diaphoresis Vomiting Diabetes Anemia History of chronic pancreatitis  Plan: I don't think that this is related to his chronic pancreatitis problems. Since he is diabetic need to make sure his EKG looks okay. Will recheck a CBC to make sure he is not getting more anemic. He manages have a little gastritis that cause him to vomit diaphoresis.  Results for orders placed in visit on 09/27/12  POCT CBC      Result Value Range   WBC 10.3 (*) 4.6 - 10.2 K/uL   Lymph, poc 1.8  0.6 - 3.4   POC LYMPH PERCENT 17.7  10 - 50 %L   MID (cbc) 0.5  0 - 0.9   POC MID % 5.2  0 - 12 %M   POC Granulocyte 7.9 (*) 2 - 6.9   Granulocyte percent 77.1  37 - 80 %G   RBC 3.97 (*) 4.69 - 6.13 M/uL   Hemoglobin 11.6 (*) 14.1 - 18.1 g/dL   HCT, POC 78.2 (*) 95.6 - 53.7 %   MCV 95.5  80 - 97 fL   MCH, POC 29.2  27 - 31.2 pg   MCHC 30.6 (*) 31.8 - 35.4 g/dL   RDW, POC 21.3     Platelet Count, POC 304  142 - 424 K/uL   MPV 9.4  0 - 99.8 fL  GLUCOSE, POCT (MANUAL RESULT ENTRY)      Result Value Range   POC Glucose 164 (*) 70 - 99 mg/dl   EKG normal  Assessment and plan: Etiology uncertain for the diaphoresis. We'll  give him something for the nausea. I a am not sure why he is anemic but unsure some of his fatigue comes from that and distress there at home

## 2012-10-03 ENCOUNTER — Ambulatory Visit (INDEPENDENT_AMBULATORY_CARE_PROVIDER_SITE_OTHER): Payer: Medicare Other | Admitting: Family Medicine

## 2012-10-03 VITALS — BP 128/62 | HR 88 | Temp 98.1°F | Resp 16 | Wt 206.0 lb

## 2012-10-03 DIAGNOSIS — F329 Major depressive disorder, single episode, unspecified: Secondary | ICD-10-CM

## 2012-10-03 DIAGNOSIS — Z639 Problem related to primary support group, unspecified: Secondary | ICD-10-CM

## 2012-10-03 DIAGNOSIS — R5383 Other fatigue: Secondary | ICD-10-CM

## 2012-10-03 DIAGNOSIS — E119 Type 2 diabetes mellitus without complications: Secondary | ICD-10-CM

## 2012-10-03 DIAGNOSIS — F32A Depression, unspecified: Secondary | ICD-10-CM

## 2012-10-03 DIAGNOSIS — D649 Anemia, unspecified: Secondary | ICD-10-CM

## 2012-10-03 DIAGNOSIS — F439 Reaction to severe stress, unspecified: Secondary | ICD-10-CM

## 2012-10-03 DIAGNOSIS — R5381 Other malaise: Secondary | ICD-10-CM

## 2012-10-03 LAB — POCT CBC
Granulocyte percent: 60.4 %G (ref 37–80)
Lymph, poc: 2.8 (ref 0.6–3.4)
MCHC: 31.6 g/dL — AB (ref 31.8–35.4)
MPV: 9.2 fL (ref 0–99.8)
POC Granulocyte: 5.6 (ref 2–6.9)
POC LYMPH PERCENT: 30.3 %L (ref 10–50)
POC MID %: 9.3 %M (ref 0–12)
RDW, POC: 13.4 %

## 2012-10-03 MED ORDER — SERTRALINE HCL 50 MG PO TABS: ORAL_TABLET | ORAL | Status: DC

## 2012-10-03 NOTE — Patient Instructions (Addendum)
Will take the sertraline one half tablet daily for 2 weeks, then increase to one tablet daily if you're tolerating it well. This is for stress and anxiety and depression and fatigue. I think it will help you some. However if you're not doing better over the next few weeks we'll probably try to chew back on the testosterone. I know you have been on it before, but I am a little reluctant to go to it because of the increased risk of strokes and heart attacks, therefore would rather try other routes first.

## 2012-10-03 NOTE — Progress Notes (Signed)
Subjective: Patient continues to feel extremely fatigued. He feels like all his energy is gone. He doesn't have any other specific symptoms. No pains. He is been examined several times lately. He said that Dr. Dallas Schimke did a rectal exam on him and that was normal. I see that he occult blood test that she did not document the prostate. Apparently things were okay.  Objective: TMs are normal. Throat clear. Neck supple without nodes or thyromegaly. No carotid bruits. Chest clear. Heart regular without murmurs. Abdomen soft nontender. Extremities without edema.  Assessment: Fatigue Diabetes Anemia Hypo-testosteronism  Plan: CBC glucose and discuss further  Results for orders placed in visit on 10/03/12  POCT CBC      Result Value Range   WBC 9.2  4.6 - 10.2 K/uL   Lymph, poc 2.8  0.6 - 3.4   POC LYMPH PERCENT 30.3  10 - 50 %L   MID (cbc) 0.9  0 - 0.9   POC MID % 9.3  0 - 12 %M   POC Granulocyte 5.6  2 - 6.9   Granulocyte percent 60.4  37 - 80 %G   RBC 4.44 (*) 4.69 - 6.13 M/uL   Hemoglobin 13.3 (*) 14.1 - 18.1 g/dL   HCT, POC 40.9 (*) 81.1 - 53.7 %   MCV 94.8  80 - 97 fL   MCH, POC 30.0  27 - 31.2 pg   MCHC 31.6 (*) 31.8 - 35.4 g/dL   RDW, POC 91.4     Platelet Count, POC 325  142 - 424 K/uL   MPV 9.2  0 - 99.8 fL  GLUCOSE, POCT (MANUAL RESULT ENTRY)      Result Value Range   POC Glucose 156 (*) 70 - 99 mg/dl

## 2012-10-20 ENCOUNTER — Other Ambulatory Visit: Payer: Self-pay | Admitting: Family Medicine

## 2012-11-13 ENCOUNTER — Ambulatory Visit (INDEPENDENT_AMBULATORY_CARE_PROVIDER_SITE_OTHER): Payer: Medicare Other | Admitting: Family Medicine

## 2012-11-13 VITALS — BP 130/66 | HR 85 | Temp 98.0°F | Resp 20 | Ht 68.0 in | Wt 202.2 lb

## 2012-11-13 DIAGNOSIS — E291 Testicular hypofunction: Secondary | ICD-10-CM

## 2012-11-13 MED ORDER — TESTOSTERONE CYPIONATE 100 MG/ML IM SOLN
200.0000 mg | INTRAMUSCULAR | Status: DC
  Administered 2012-11-28 – 2013-01-20 (×3): 200 mg via INTRAMUSCULAR

## 2012-11-13 NOTE — Progress Notes (Signed)
78 yo retired gentleman with hypogonadism and fatigue, several months since last testosterone.  His fatigue was relieved by testosterone before, but the Zoloft has not helped him over the last three weeks.  Objective:  NAD HEENT:  Unremarkable Chest:  Clear Heart: reg, no murmur Ext:  No edema  Assessment: hypogonadic  Plan:  Resume depotestosterone 200 q14 days. Stop the zoloft.  Harvie Bridge, MD

## 2012-11-14 ENCOUNTER — Ambulatory Visit (INDEPENDENT_AMBULATORY_CARE_PROVIDER_SITE_OTHER): Payer: Medicare Other | Admitting: Physician Assistant

## 2012-11-14 VITALS — BP 132/70 | HR 85 | Temp 98.0°F | Resp 16

## 2012-11-14 DIAGNOSIS — E291 Testicular hypofunction: Secondary | ICD-10-CM

## 2012-11-14 NOTE — Progress Notes (Signed)
  Subjective:    Patient ID: Justin [name], male    DOB: 11-11-1934, 78 y.o.   MRN: [id number]  HPI Pt here for testosterone shot he bought own meds  Given on the [employer] lot #[id number] exp. 04/16   Review of Systems     Objective:   Physical Exam        Assessment & Plan:

## 2012-11-14 NOTE — Progress Notes (Signed)
   Patient ID: Justin [name] MRN: [id number], DOB: 02/28/1935, 78 y.o. Date of Encounter: 11/14/2012, 2:22 PM  Primary Physician: Abbe Amsterdam, MD  Chief Complaint: Here for testosterone injection  78 y.o. male here for testosterone injection. Patient was seen on 11/13/12 and restarted on testosterone. He did not have this medication at this time. He picked up this medication today and brings it in today for his injection. This was a nursing only encounter. No provider/patient encounter occurred today.    Signed, Eula Listen, PA-C 11/14/2012 2:22 PM

## 2012-11-21 ENCOUNTER — Other Ambulatory Visit: Payer: Self-pay | Admitting: Internal Medicine

## 2012-11-28 ENCOUNTER — Ambulatory Visit (INDEPENDENT_AMBULATORY_CARE_PROVIDER_SITE_OTHER): Payer: Medicare Other

## 2012-11-28 DIAGNOSIS — E291 Testicular hypofunction: Secondary | ICD-10-CM

## 2012-11-28 DIAGNOSIS — E349 Endocrine disorder, unspecified: Secondary | ICD-10-CM

## 2012-12-06 ENCOUNTER — Other Ambulatory Visit: Payer: Self-pay | Admitting: Family Medicine

## 2012-12-07 ENCOUNTER — Ambulatory Visit: Payer: Medicare Other

## 2012-12-07 ENCOUNTER — Ambulatory Visit (INDEPENDENT_AMBULATORY_CARE_PROVIDER_SITE_OTHER): Payer: Medicare Other | Admitting: Family Medicine

## 2012-12-07 VITALS — BP 142/66 | HR 95 | Temp 98.2°F | Resp 17 | Ht 72.0 in | Wt 206.0 lb

## 2012-12-07 DIAGNOSIS — R059 Cough, unspecified: Secondary | ICD-10-CM

## 2012-12-07 DIAGNOSIS — E1110 Type 2 diabetes mellitus with ketoacidosis without coma: Secondary | ICD-10-CM

## 2012-12-07 DIAGNOSIS — R05 Cough: Secondary | ICD-10-CM

## 2012-12-07 DIAGNOSIS — E1140 Type 2 diabetes mellitus with diabetic neuropathy, unspecified: Secondary | ICD-10-CM

## 2012-12-07 DIAGNOSIS — E1149 Type 2 diabetes mellitus with other diabetic neurological complication: Secondary | ICD-10-CM

## 2012-12-07 LAB — COMPREHENSIVE METABOLIC PANEL
ALT: 18 U/L (ref 0–53)
AST: 17 U/L (ref 0–37)
Albumin: 3.8 g/dL (ref 3.5–5.2)
Alkaline Phosphatase: 74 U/L (ref 39–117)
BUN: 13 mg/dL (ref 6–23)
CO2: 27 mEq/L (ref 19–32)
Calcium: 9.2 mg/dL (ref 8.4–10.5)
Chloride: 97 mEq/L (ref 96–112)
Creat: 0.95 mg/dL (ref 0.50–1.35)
Glucose, Bld: 372 mg/dL — ABNORMAL HIGH (ref 70–99)
Potassium: 4.1 mEq/L (ref 3.5–5.3)
Sodium: 132 mEq/L — ABNORMAL LOW (ref 135–145)
Total Bilirubin: 0.8 mg/dL (ref 0.3–1.2)
Total Protein: 7.7 g/dL (ref 6.0–8.3)

## 2012-12-07 LAB — POCT CBC
Granulocyte percent: 65 %G (ref 37–80)
HCT, POC: 39.6 % — AB (ref 43.5–53.7)
Hemoglobin: 12.4 g/dL — AB (ref 14.1–18.1)
Lymph, poc: 2.5 (ref 0.6–3.4)
MCH, POC: 29.1 pg (ref 27–31.2)
MCHC: 31.3 g/dL — AB (ref 31.8–35.4)
MCV: 93 fL (ref 80–97)
MID (cbc): 0.7 (ref 0–0.9)
MPV: 10 fL (ref 0–99.8)
POC Granulocyte: 5.9 (ref 2–6.9)
POC LYMPH PERCENT: 27.7 %L (ref 10–50)
POC MID %: 7.3 %M (ref 0–12)
Platelet Count, POC: 268 10*3/uL (ref 142–424)
RBC: 4.26 M/uL — AB (ref 4.69–6.13)
RDW, POC: 13.6 %
WBC: 9.1 10*3/uL (ref 4.6–10.2)

## 2012-12-07 LAB — POCT GLYCOSYLATED HEMOGLOBIN (HGB A1C): Hemoglobin A1C: 9.2

## 2012-12-07 LAB — GLUCOSE, POCT (MANUAL RESULT ENTRY): POC Glucose: 419 mg/dl — AB (ref 70–99)

## 2012-12-07 IMAGING — CR DG CHEST 2V
2 series · 2 of 2 positions shown · non-contrast
Comparison: [DATE]

CLINICAL DATA: Shortness of breath

CHEST - 2 VIEW

[PA]
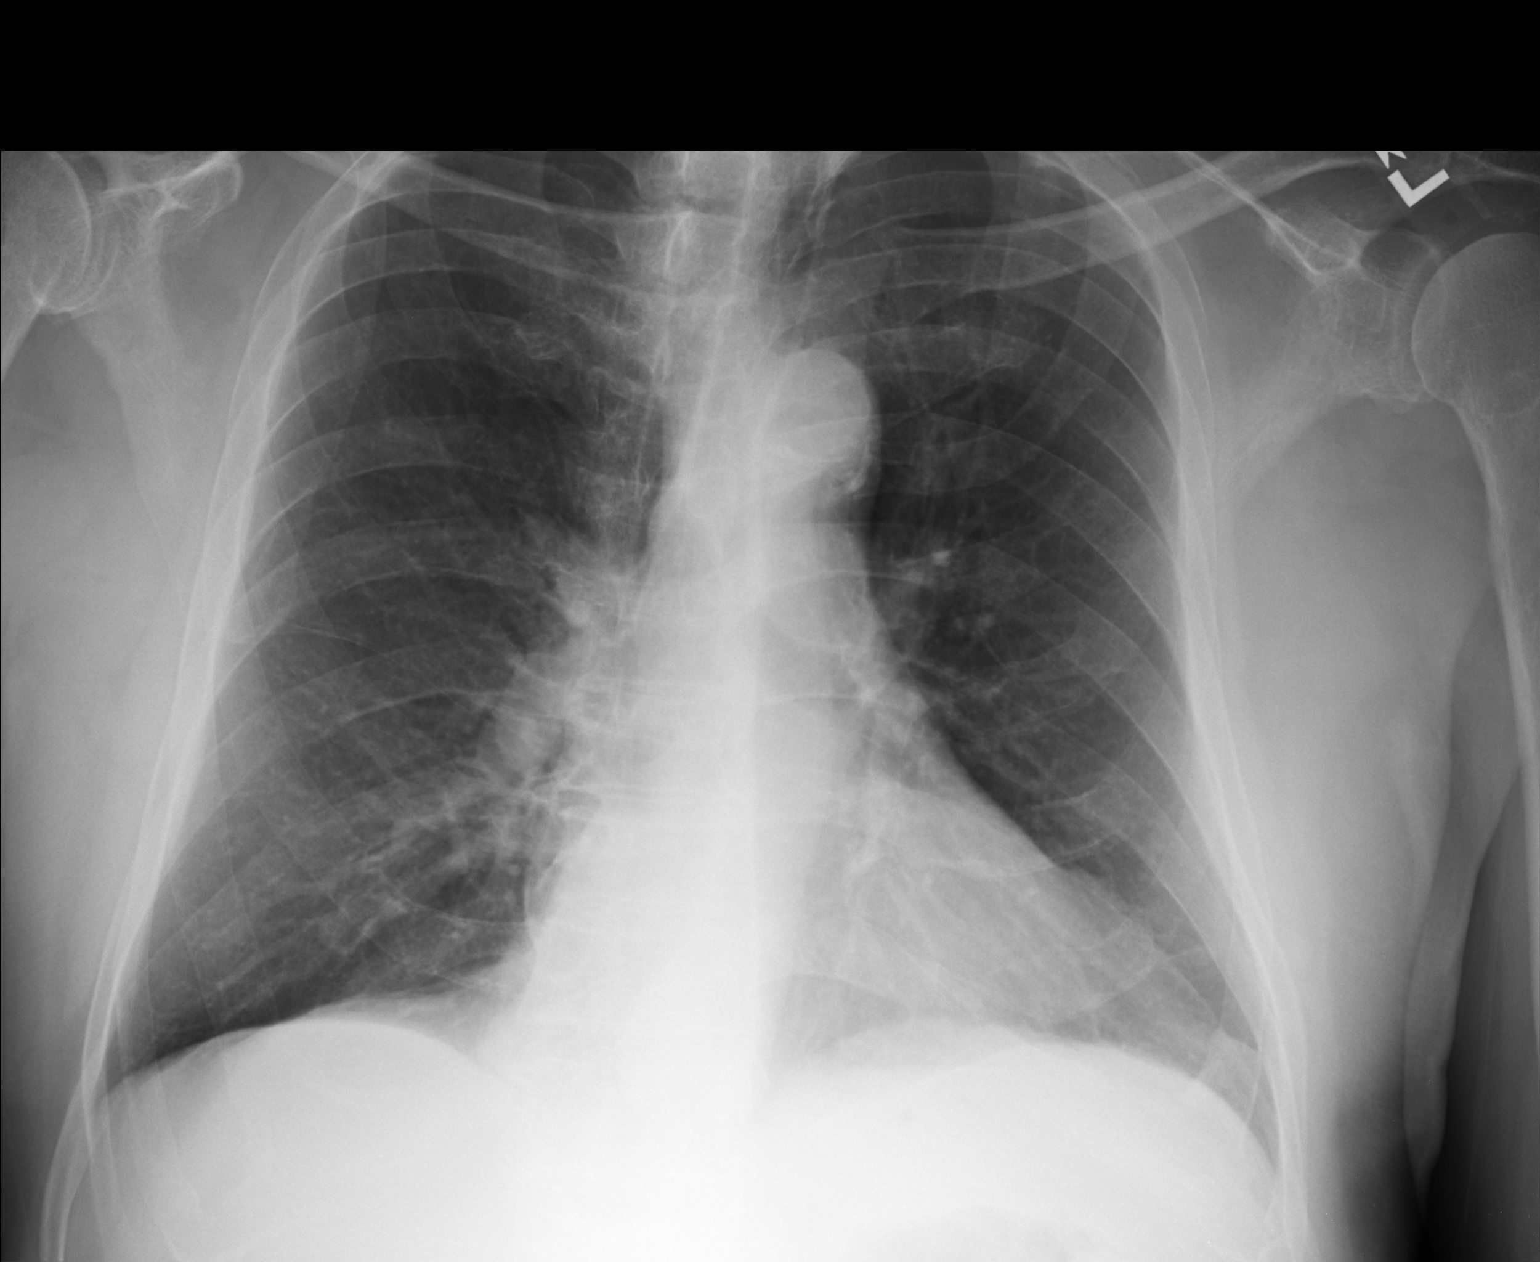

[lateral]
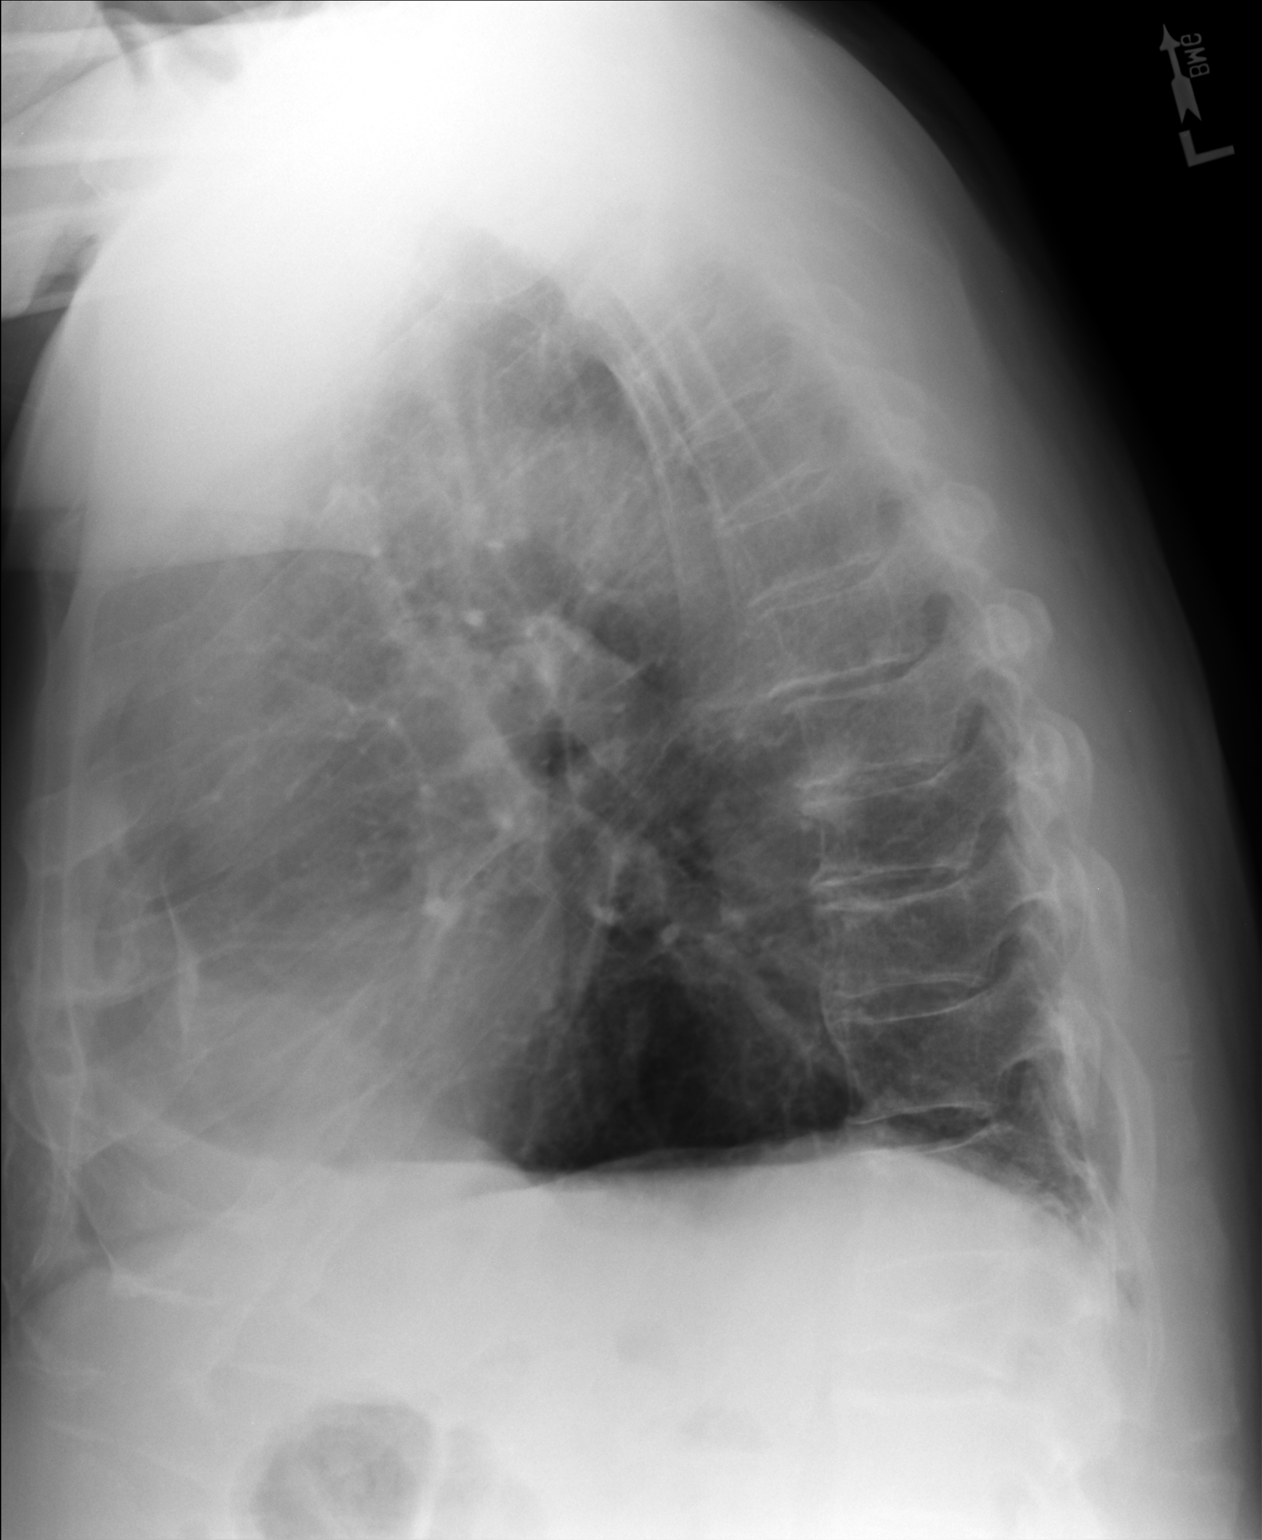

[2 of 2 positions shown; findings below may reference images not displayed]

FINDINGS: Cardiac shadow is within normal limits.  The lungs are
clear bilaterally.  No acute bony abnormality is seen.  A chronic
compression deformity is noted in the lower thoracic spine.
IMPRESSION: No acute abnormality noted.

Clinically significant discrepancy from primary report, if
provided: None

## 2012-12-07 NOTE — Patient Instructions (Signed)
Stop the lisinopril hct Stop the Zocor Return in one week

## 2012-12-07 NOTE — Progress Notes (Signed)
Is a 78 year old gentleman with chronic calf pain. He's seen an orthopedist who recommended inserts. He try the inserts and it made no difference at all. The pain is only in the calves.  Patient also has diabetes. He says his blood sugar has not been well controlled. He's developed some tingling in his feet as well.  Patient also has a chronic cough. He's taking an inhaler but has not control and cough. The cough is productive and not associated with fever. He's had no hemoptysis.  Objective: No acute distress, kind gentleman. HEENT: Unremarkable Chest: Clear to auscultation Heart: Regular without murmur or gallop Palpation of calves is nontender. There is no edema or varicosities. Examination of feet is normal with exception of some hammertoes. He has normal sensation to touch. And good range of motion. [facility] reading (PRIMARY) by  Dr. Milus Glazier:  CXR. Mild increase in interstitial markings but no effusion or cardiomegaly  Results for orders placed in visit on 12/07/12  POCT CBC      Result Value Range   WBC 9.1  4.6 - 10.2 K/uL   Lymph, poc 2.5  0.6 - 3.4   POC LYMPH PERCENT 27.7  10 - 50 %L   MID (cbc) 0.7  0 - 0.9   POC MID % 7.3  0 - 12 %M   POC Granulocyte 5.9  2 - 6.9   Granulocyte percent 65.0  37 - 80 %G   RBC 4.26 (*) 4.69 - 6.13 M/uL   Hemoglobin 12.4 (*) 14.1 - 18.1 g/dL   HCT, POC 16.1 (*) 09.6 - 53.7 %   MCV 93.0  80 - 97 fL   MCH, POC 29.1  27 - 31.2 pg   MCHC 31.3 (*) 31.8 - 35.4 g/dL   RDW, POC 04.5     Platelet Count, POC 268  142 - 424 K/uL   MPV 10.0  0 - 99.8 fL  GLUCOSE, POCT (MANUAL RESULT ENTRY)      Result Value Range   POC Glucose 419 (*) 70 - 99 mg/dl  POCT GLYCOSYLATED HEMOGLOBIN (HGB A1C)      Result Value Range   Hemoglobin A1C 9.2         Assessment: I think is a good chance this patient has reaction to the Zocor some asking him to stop that. He's developing some paresthesias in his feet which is consistent with diabetic neuropathy. Patient is  eating too many sports drinks and we talked about this as well to try to get that sugar down.  Plan: Discontinue the Zocor and the lisinopril to see if these 2 things will result in decrease in leg pain and cough Recheck next Sunday  Signed, Elvina Sidle

## 2012-12-14 ENCOUNTER — Ambulatory Visit (INDEPENDENT_AMBULATORY_CARE_PROVIDER_SITE_OTHER): Payer: Medicare Other | Admitting: Family Medicine

## 2012-12-14 VITALS — BP 138/76 | HR 96 | Temp 98.7°F | Resp 16 | Ht 72.0 in | Wt 202.4 lb

## 2012-12-14 DIAGNOSIS — E119 Type 2 diabetes mellitus without complications: Secondary | ICD-10-CM

## 2012-12-14 DIAGNOSIS — E785 Hyperlipidemia, unspecified: Secondary | ICD-10-CM

## 2012-12-14 DIAGNOSIS — E109 Type 1 diabetes mellitus without complications: Secondary | ICD-10-CM

## 2012-12-14 DIAGNOSIS — I10 Essential (primary) hypertension: Secondary | ICD-10-CM | POA: Insufficient documentation

## 2012-12-14 DIAGNOSIS — D649 Anemia, unspecified: Secondary | ICD-10-CM

## 2012-12-14 DIAGNOSIS — E291 Testicular hypofunction: Secondary | ICD-10-CM

## 2012-12-14 DIAGNOSIS — E349 Endocrine disorder, unspecified: Secondary | ICD-10-CM

## 2012-12-14 DIAGNOSIS — I2510 Atherosclerotic heart disease of native coronary artery without angina pectoris: Secondary | ICD-10-CM

## 2012-12-14 DIAGNOSIS — R6889 Other general symptoms and signs: Secondary | ICD-10-CM

## 2012-12-14 LAB — LIPID PANEL: Cholesterol: 130 mg/dL (ref 0–200)

## 2012-12-14 LAB — VITAMIN B12: Vitamin B-12: 358 pg/mL (ref 211–911)

## 2012-12-14 MED ORDER — OMEPRAZOLE 20 MG PO CPDR: DELAYED_RELEASE_CAPSULE | ORAL | Status: DC

## 2012-12-14 MED ORDER — INSULIN PEN NEEDLE 32G X 4 MM MISC: Status: DC

## 2012-12-14 MED ORDER — INSULIN LISPRO 100 UNIT/ML (KWIKPEN): 15.0000 [IU] | PEN_INJECTOR | Freq: Three times a day (TID) | SUBCUTANEOUS | Status: DC

## 2012-12-14 MED ORDER — METFORMIN HCL 1000 MG PO TABS: 1000.0000 mg | ORAL_TABLET | Freq: Two times a day (BID) | ORAL | Status: DC

## 2012-12-14 MED ORDER — CLOPIDOGREL BISULFATE 75 MG PO TABS: 75.0000 mg | ORAL_TABLET | Freq: Every day | ORAL | Status: DC

## 2012-12-14 NOTE — Patient Instructions (Addendum)
Bring all of your medications to your next visit. We have several duplicate medications on your chart and this way we can make sure we are refilling the correct ones.  We will need to restart your cholesterol medication since your leg pain hasn't improved - we will call you with whether we want you to restart the zocor or a different medication.  Your cough has not improved, restart your lisinopril.  Start taking humalog 15u before every meal - so three times a day. Bring your meter to your next visit and your endocrinology appointment. Continue your other diabetes insulin and metformin medication.  Come back in 1 week to get your testosterone level checked so we can make sure you are on the right dose - maybe this is why you are feeling so low energy.  If you haven't had any improvement in the zoloft, lets go off of it.  Take 1/2 tab of zoloft every day for the next week then stop it.

## 2012-12-14 NOTE — Progress Notes (Addendum)
Subjective:    Patient ID: Justin [name], male    DOB: [birth year], 78 y.o.   MRN: [id number] Chief Complaint  Patient presents with  . Follow-up    from 8/31, cough better. Having trouble with mucus . Testosterone shot.    HPI  Checks blood sugars every morning and every night - this morning was 280. Very seldom get <150, occ gets 87 and gets hypoglycemic - up to 3-4x/mo prev but hardly at all in the past sev mos. CBGs usually running about 150-300 - usually in 200s.  However, in the past sev mos cbgs have been getting even worse, most of sugars have been in 300s at night and last night cbg was 425.  Does not eat very much - lots of veggies.  Often has sausage, egg, and cheese biscuit for breakfast, and then sub sandwhich for lunch, then often doesn't eat much supper.  Drinks diet ginger ale mainly. Is up for whatever he has to do to get his sugars better - happy to take more insulin shots or check his sugars more - just wants to get the cbgs better so he feels better.  Sill with productive cough at night, maybe slight improvement since stopping lisinopril but not sure as still coughing lots. Leg pain in upper thigh/hip has not improved at all since stopping zocor.  Pt has severe fatigue - hard to get up and get going at all.  Has not noticed any improvement in this since starting zoloft. Does not feel depressed at all - mood good, enjoys life, no crying or feeling down. Retired just last week - has planted a late summer garden, is starting to fix up an old truck which he has had and wanted to do since the 1970s, is planning an extended vacation with his wife - they are going to take a leisurely drive up to [facility] then make a few stops on the way down.  Thinking about leaving next wk after his next visit here.  Current Outpatient Prescriptions on File Prior to Visit  Medication Sig Dispense Refill  . ADVAIR DISKUS 250-50 MCG/DOSE AEPB INHALE ONE DOSE BY MOUTH INTO THE LUNGS TWICE  DAILY  60 each  3  . clopidogrel (PLAVIX) 75 MG tablet Take 1 tablet (75 mg total) by mouth daily.  30 tablet  10  . EASY TOUCH TEST test strip CHECK BLOOD SUGAR THREE TIMES DAILY  100 each  0  . fish oil-omega-3 fatty acids 1000 MG capsule Take 2 g by mouth daily.        . Fluticasone-Salmeterol (ADVAIR) 250-50 MCG/DOSE AEPB Inhale 1 puff into the lungs as needed.       . insulin detemir (LEVEMIR) 100 UNIT/ML injection Inject 0.5 mLs (50 Units total) into the skin 2 (two) times daily.  10 mL  11  . l-methylfolate-B6-B12 (METANX) 3-35-2 MG TABS Take 1 tablet by mouth daily.        . Lancets MISC To check blood glucose tid  100 each  5  . lipase/protease/amylase (CREON-12/PANCREASE) 12000 UNITS CPEP Take 2 capsules by mouth 3 (three) times daily with meals.  540 capsule  1  . lisinopril-hydrochlorothiazide (PRINZIDE,ZESTORETIC) 10-12.5 MG per tablet Take 1 tablet by mouth daily.  90 tablet  3  . metFORMIN (GLUCOPHAGE) 1000 MG tablet Take 1 tablet (1,000 mg total) by mouth 2 (two) times daily with a meal.  180 tablet  3  . Multiple Vitamins-Minerals (MULTIVITAMIN WITH MINERALS) tablet Take 1  tablet by mouth daily.        Marland Kitchen omeprazole (PRILOSEC) 20 MG capsule TAKE ONE CAPSULE BY MOUTH EVERY DAY  90 capsule  0  . ondansetron (ZOFRAN ODT) 4 MG disintegrating tablet Take 1 tablet (4 mg total) by mouth every 8 (eight) hours as needed for nausea.  10 tablet  0  . Pancrelipase, Lip-Prot-Amyl, 10500 UNITS CPEP Take 2 capsules (21,000 Units total) by mouth 3 x daily with food.  540 capsule  6  . Pancrelipase, Lip-Prot-Amyl, 5000 UNITS CPEP Take 1 capsule (5,000 Units total) by mouth daily.  30 capsule  11  . sertraline (ZOLOFT) 50 MG tablet Take 1/2 daily for 2 weeks, then increase to one each morning for stress and depression and lack of energy  30 tablet  3  . simvastatin (ZOCOR) 40 MG tablet Take 1 tablet (40 mg total) by mouth at bedtime.  90 tablet  1  . Vitamin D, Ergocalciferol, (DRISDOL) 50000 UNITS  CAPS Take 50,000 Units by mouth.        . [DISCONTINUED] testosterone cypionate (DEPOTESTOTERONE CYPIONATE) 200 MG/ML injection Inject into the muscle every 14 (fourteen) days.       Current Facility-Administered Medications on File Prior to Visit  Medication Dose Route Frequency Provider Last Rate Last Dose  . testosterone cypionate (DEPOTESTOTERONE CYPIONATE) injection 200 mg  200 mg Intramuscular Q14 Days Elvina Sidle, MD   200 mg at 09/13/11 0827  . testosterone cypionate (DEPOTESTOTERONE CYPIONATE) injection 200 mg  200 mg Intramuscular Q28 days Elvina Sidle, MD   200 mg at 08/09/11 1140  . testosterone cypionate (DEPOTESTOTERONE CYPIONATE) injection 200 mg  200 mg Intramuscular Q14 Days Elvina Sidle, MD      . testosterone cypionate (DEPOTESTOTERONE CYPIONATE) injection 200 mg  200 mg Intramuscular Q14 Days Elvina Sidle, MD      . testosterone cypionate (DEPOTESTOTERONE CYPIONATE) injection 200 mg  200 mg Intramuscular Q14 Days Elvina Sidle, MD   200 mg at 11/28/12 1610   Past Medical History  Diagnosis Date  . HTN (hypertension)   . Diabetes mellitus   . Hypercholesteremia   . TIA (transient ischemic attack)   . Iron deficiency   . Arthritis   . Stroke    No Known Allergies    Review of Systems  Constitutional: Positive for fatigue. Negative for fever, chills, activity change, appetite change and unexpected weight change.  Respiratory: Positive for cough. Negative for shortness of breath.   Cardiovascular: Negative for chest pain.  Musculoskeletal: Positive for myalgias and arthralgias.      BP 138/76  Pulse 96  Temp(Src) 98.7 F (37.1 C) (Oral)  Resp 16  Ht 6' (1.829 m)  Wt 202 lb 6.4 oz (91.808 kg)  BMI 27.44 kg/m2  SpO2 98% Objective:   Physical Exam  Constitutional: He is oriented to person, place, and time. He appears well-developed and well-nourished. No distress.  HENT:  Head: Normocephalic and atraumatic.  Eyes: Conjunctivae are normal.  Pupils are equal, round, and reactive to light. No scleral icterus.  Neck: Normal range of motion. Neck supple. No thyromegaly present.  Cardiovascular: Normal rate, regular rhythm and normal heart sounds.   Pulmonary/Chest: Effort normal. No respiratory distress. He has decreased breath sounds in the left upper field and the left lower field.  Musculoskeletal: He exhibits no edema.  Lymphadenopathy:    He has no cervical adenopathy.  Neurological: He is alert and oriented to person, place, and time.  Skin: Skin is warm and dry.  He is not diaphoretic.  Psychiatric: He has a normal mood and affect. His behavior is normal.      Assessment & Plan:  Testosterone deficiency - cont testosterone depo shot 200 mg every 14d - possible that level is still low due to his c/o of severe fatigue sxs - recommend rtc for testosterone level check in 1 week to see if dose needs to be adjusted  Diabetes mellitus type 1 - Plan: Ambulatory referral to Endocrinology. Start mealtime humalog pen of 15u before every meal. Cont bid levemir 50u and metformin 1000 bid. Refer to endocrine for additional insulin management and DM eduation.  Bring meter to f/u in 1 week and consider titrating up mealtime insulin at that time.  Coronary artery disease  Essential hypertension, benign - retart lisinopril /hctz as still coughing. BP only mildly elev - consider stopping hctz part of lisinopril at f/u.  Hyperlipidemia LDL goal <70 - Plan: Lipid panel - was on zocor - off for last week but no improvement in muscle cramps so will likely need to restart statin but check lipids first to see if statin dose needs to be changed. Consider changing statin type from zocor to see if has any difference on leg pain.  ADDENDUM: LDL cont to be at 80, will switch from simvastatin 40 to atorvastatin 40  ASCVD (arteriosclerotic cardiovascular disease) - Plan: clopidogrel (PLAVIX) 75 MG tablet  Anemia - Plan: Vitamin B12 - ferritin has always  been wnml  Other general symptoms(780.99)  - Plan: Vitamin B12. zoloft not helping fatigue sxs so will have him wean off - take 1/2 daily for a week then stop zoloft. ADDENDUM: vitamin b12 is on low side of normal and pt is already on oral supplementation according to his med list. Consider trial of vit B12 inj at next OV to see if helps w/ energy levels.  Pancreatitis sxs - on pancreatic enzyme supp - unclear if chronic pancreatitis, recent prev lipase/amylase have been wnml, unclear which enzyme pt is taking as has 3 different on his chart but suspect he is on the 12000u 2 caps tid - he states he has plenty and will have the pharmacist send [place] a refill request when he needs more to ensure the correct one is refilled.  I will not be here in 1 wk but pt saw Dr. Milus Glazier last wk so have asked pt to f/u with him in 1 wk.  Meds ordered this encounter  Medications  . insulin lispro (HUMALOG KWIKPEN) 100 UNIT/ML SOPN    Sig: Inject 15 Units into the skin 3 (three) times daily with meals.    Dispense:  3 mL    Refill:  2  . Insulin Pen Needle 32G X 4 MM MISC    Sig: Use as directed to inject insulin tidwc    Dispense:  100 each    Refill:  11  . clopidogrel (PLAVIX) 75 MG tablet    Sig: Take 1 tablet (75 mg total) by mouth daily.    Dispense:  30 tablet    Refill:  10  . metFORMIN (GLUCOPHAGE) 1000 MG tablet    Sig: Take 1 tablet (1,000 mg total) by mouth 2 (two) times daily with a meal.    Dispense:  180 tablet    Refill:  3  . omeprazole (PRILOSEC) 20 MG capsule    Sig: TAKE ONE CAPSULE BY MOUTH EVERY DAY    Dispense:  90 capsule    Refill:  0

## 2012-12-16 ENCOUNTER — Other Ambulatory Visit: Payer: Self-pay | Admitting: Radiology

## 2012-12-16 MED ORDER — INSULIN ASPART 100 UNIT/ML FLEXPEN: 15.0000 [IU] | PEN_INJECTOR | Freq: Three times a day (TID) | SUBCUTANEOUS | Status: DC

## 2012-12-16 NOTE — Telephone Encounter (Signed)
Insurance will not cover the Humalog pen, have pended Novolog, insurance will cover, please advise.

## 2012-12-17 ENCOUNTER — Encounter: Payer: Self-pay | Admitting: Family Medicine

## 2012-12-17 MED ORDER — ATORVASTATIN CALCIUM 40 MG PO TABS: 40.0000 mg | ORAL_TABLET | Freq: Every day | ORAL | Status: DC

## 2012-12-17 NOTE — Addendum Note (Signed)
Addended by: Norberto Sorenson on: 12/17/2012 02:29 PM   Modules accepted: Orders, Medications

## 2012-12-22 ENCOUNTER — Ambulatory Visit (INDEPENDENT_AMBULATORY_CARE_PROVIDER_SITE_OTHER): Payer: Medicare Other | Admitting: Family Medicine

## 2012-12-22 ENCOUNTER — Encounter: Payer: Self-pay | Admitting: Family Medicine

## 2012-12-22 VITALS — BP 146/90 | HR 94 | Temp 97.6°F | Resp 20 | Ht 68.0 in | Wt 202.6 lb

## 2012-12-22 DIAGNOSIS — R059 Cough, unspecified: Secondary | ICD-10-CM

## 2012-12-22 DIAGNOSIS — R05 Cough: Secondary | ICD-10-CM

## 2012-12-22 DIAGNOSIS — E119 Type 2 diabetes mellitus without complications: Secondary | ICD-10-CM

## 2012-12-22 MED ORDER — HYDROCODONE-HOMATROPINE 5-1.5 MG/5ML PO SYRP: 5.0000 mL | ORAL_SOLUTION | Freq: Three times a day (TID) | ORAL | Status: DC | PRN

## 2012-12-22 NOTE — Progress Notes (Signed)
  Subjective:    Patient ID: Justin [name], male    DOB: Aug 16, 1934, 78 y.o.   MRN: [id number]  HPI 78 y.o. Male presents to clinic today for follow up appt from 12/14/2012. Patient was prescribed novolog to take before meals. Takes 15 units currently.  In the evening it has been running around 450.   Patient having trouble with cough that he cant seem to get rid of. Pateint denies any trouble with heartburn. Is currently taking omeprazole for this. Having trouble with swelling in legs .  Review of Systems No shortness of breath or fever    Objective:   Physical Exam No acute distress HEENT: Unremarkable Chest: Few rhonchi otherwise clear Heart: Regular no murmur Skin: Clear Neck: Supple no adenopathy Extremities: 1+ pedal edema, bilaterally    Assessment & Plan:  Chronic cough of uncertain etiology. The diabetes seems better and I'm glad that he currently has an orthopedic appointment. I've asked him to reduce the no light insulin to 15 units at night before supper and just 12 units before breakfast and lunch.  Cough - Plan: HYDROcodone-homatropine (HYCODAN) 5-1.5 MG/5ML syrup  Diabetes Recheck 2 weeks Signed, Elvina Sidle, MD

## 2012-12-23 ENCOUNTER — Other Ambulatory Visit: Payer: Self-pay

## 2012-12-23 MED ORDER — OMEPRAZOLE 20 MG PO CPDR: DELAYED_RELEASE_CAPSULE | ORAL | Status: DC

## 2012-12-26 ENCOUNTER — Ambulatory Visit: Payer: Medicare Other

## 2012-12-26 ENCOUNTER — Ambulatory Visit (INDEPENDENT_AMBULATORY_CARE_PROVIDER_SITE_OTHER): Payer: Medicare Other | Admitting: Family Medicine

## 2012-12-26 VITALS — BP 126/60 | HR 83 | Temp 97.9°F | Resp 18 | Ht 69.0 in | Wt 201.0 lb

## 2012-12-26 DIAGNOSIS — Z8719 Personal history of other diseases of the digestive system: Secondary | ICD-10-CM

## 2012-12-26 DIAGNOSIS — IMO0002 Reserved for concepts with insufficient information to code with codable children: Secondary | ICD-10-CM

## 2012-12-26 DIAGNOSIS — R1013 Epigastric pain: Secondary | ICD-10-CM

## 2012-12-26 DIAGNOSIS — E1165 Type 2 diabetes mellitus with hyperglycemia: Secondary | ICD-10-CM

## 2012-12-26 DIAGNOSIS — Z139 Encounter for screening, unspecified: Secondary | ICD-10-CM

## 2012-12-26 LAB — POCT CBC
HCT, POC: 42 % — AB (ref 43.5–53.7)
Lymph, poc: 3.2 (ref 0.6–3.4)
MCHC: 31.7 g/dL — AB (ref 31.8–35.4)
MID (cbc): 0.8 (ref 0–0.9)
POC Granulocyte: 6 (ref 2–6.9)
POC LYMPH PERCENT: 31.9 %L (ref 10–50)
POC MID %: 8.3 %M (ref 0–12)
Platelet Count, POC: 345 10*3/uL (ref 142–424)
RDW, POC: 13.7 %

## 2012-12-26 LAB — IFOBT (OCCULT BLOOD): IFOBT: NEGATIVE

## 2012-12-26 IMAGING — CR DG ABDOMEN ACUTE W/ 1V CHEST
4 series · 4 of 4 positions shown · non-contrast
Comparison: Chest radiograph, [DATE]

CLINICAL DATA: Epigastric pain.

EXAM:
ACUTE ABDOMEN SERIES (ABDOMEN 2 VIEW & CHEST 1 VIEW)

[PA]
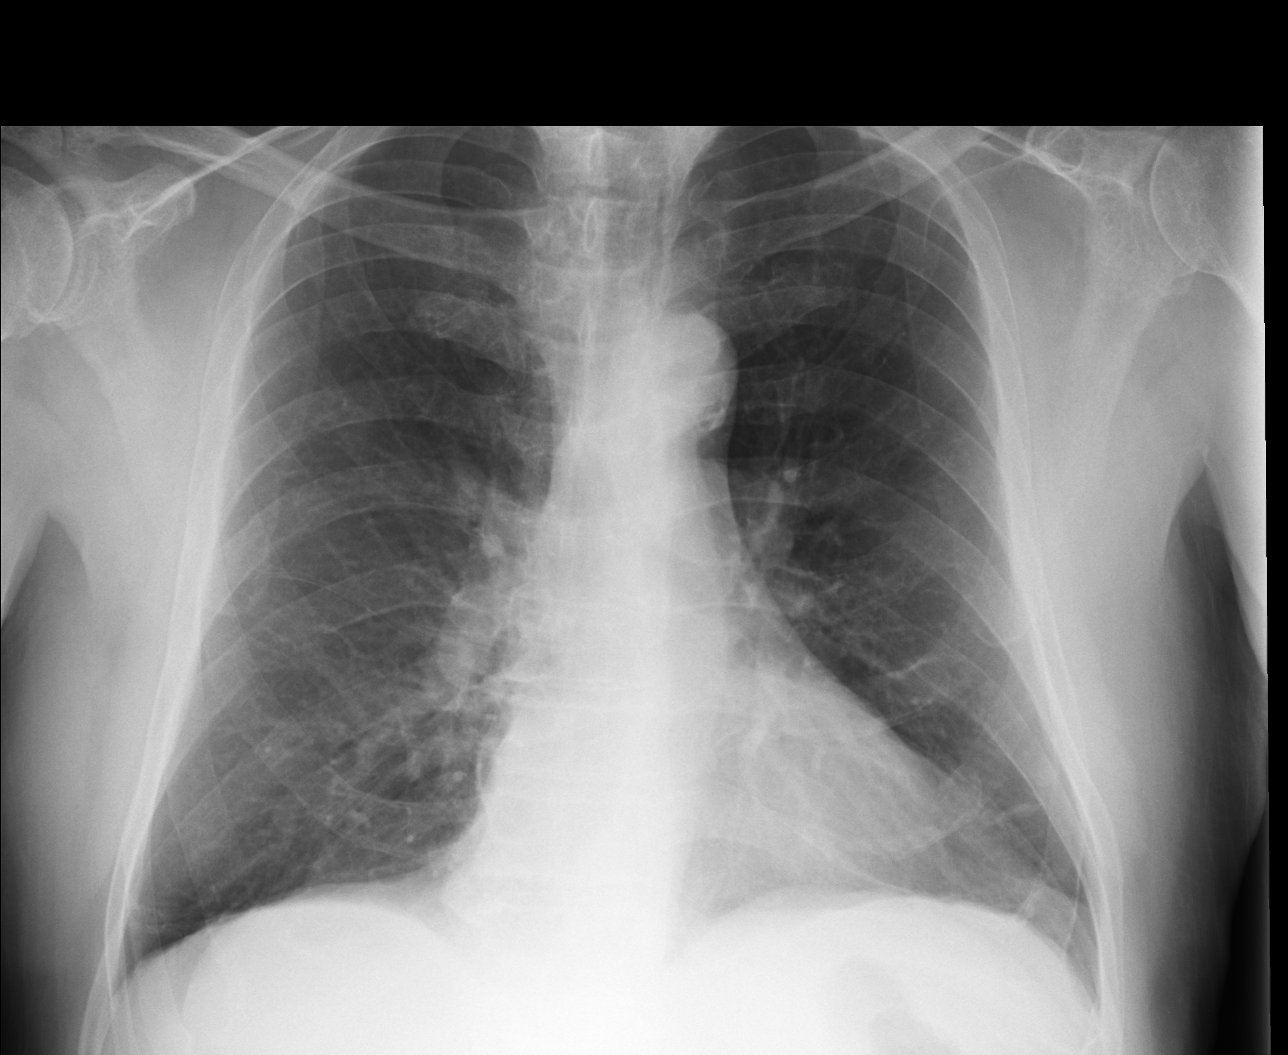

[AP (1 of 3)]
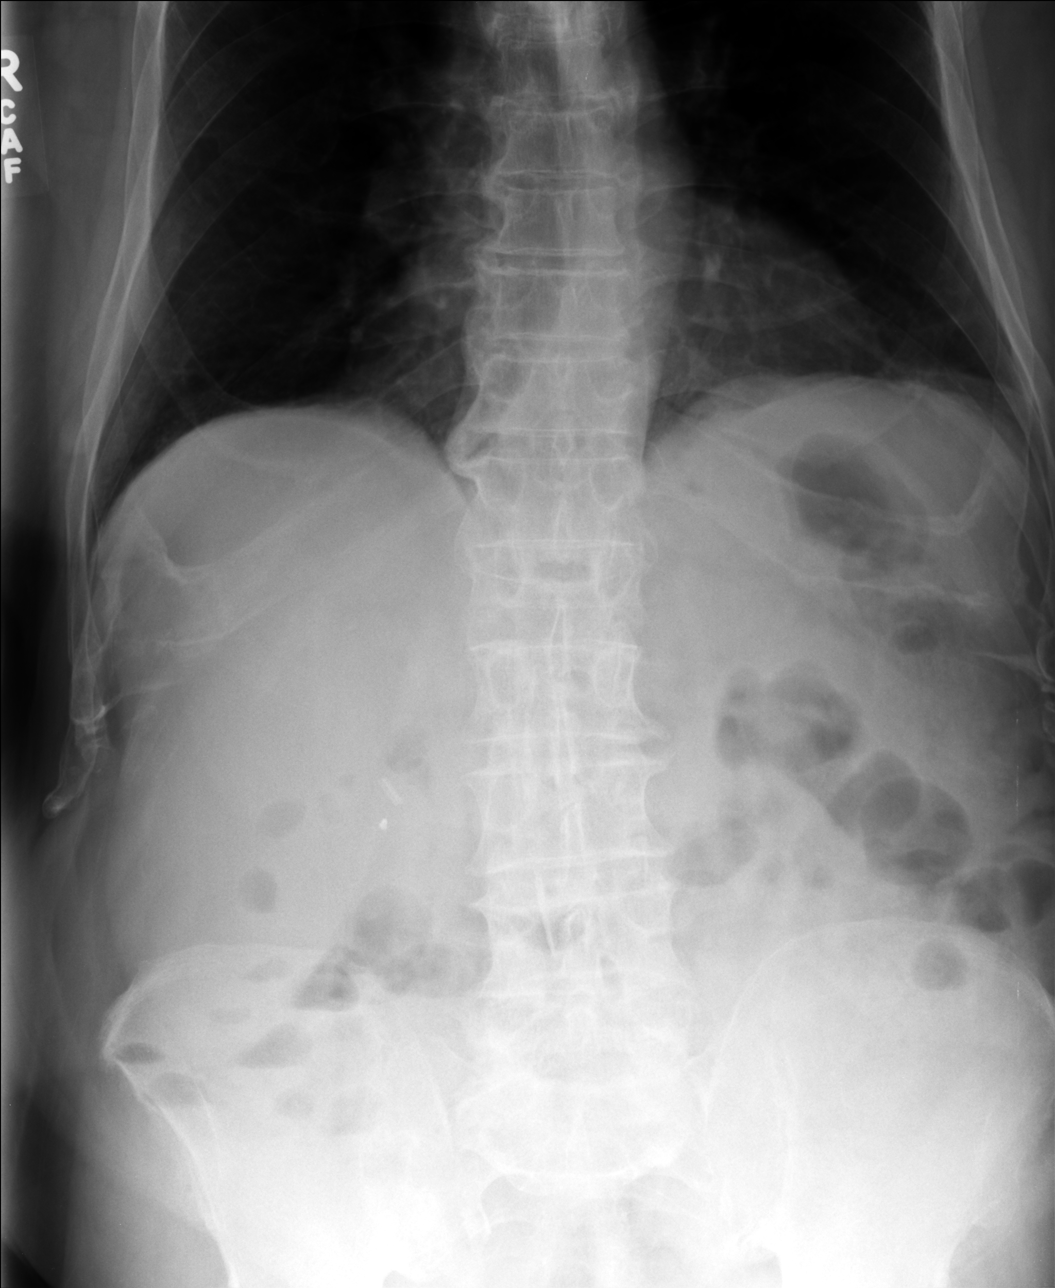

[AP (2 of 3)]
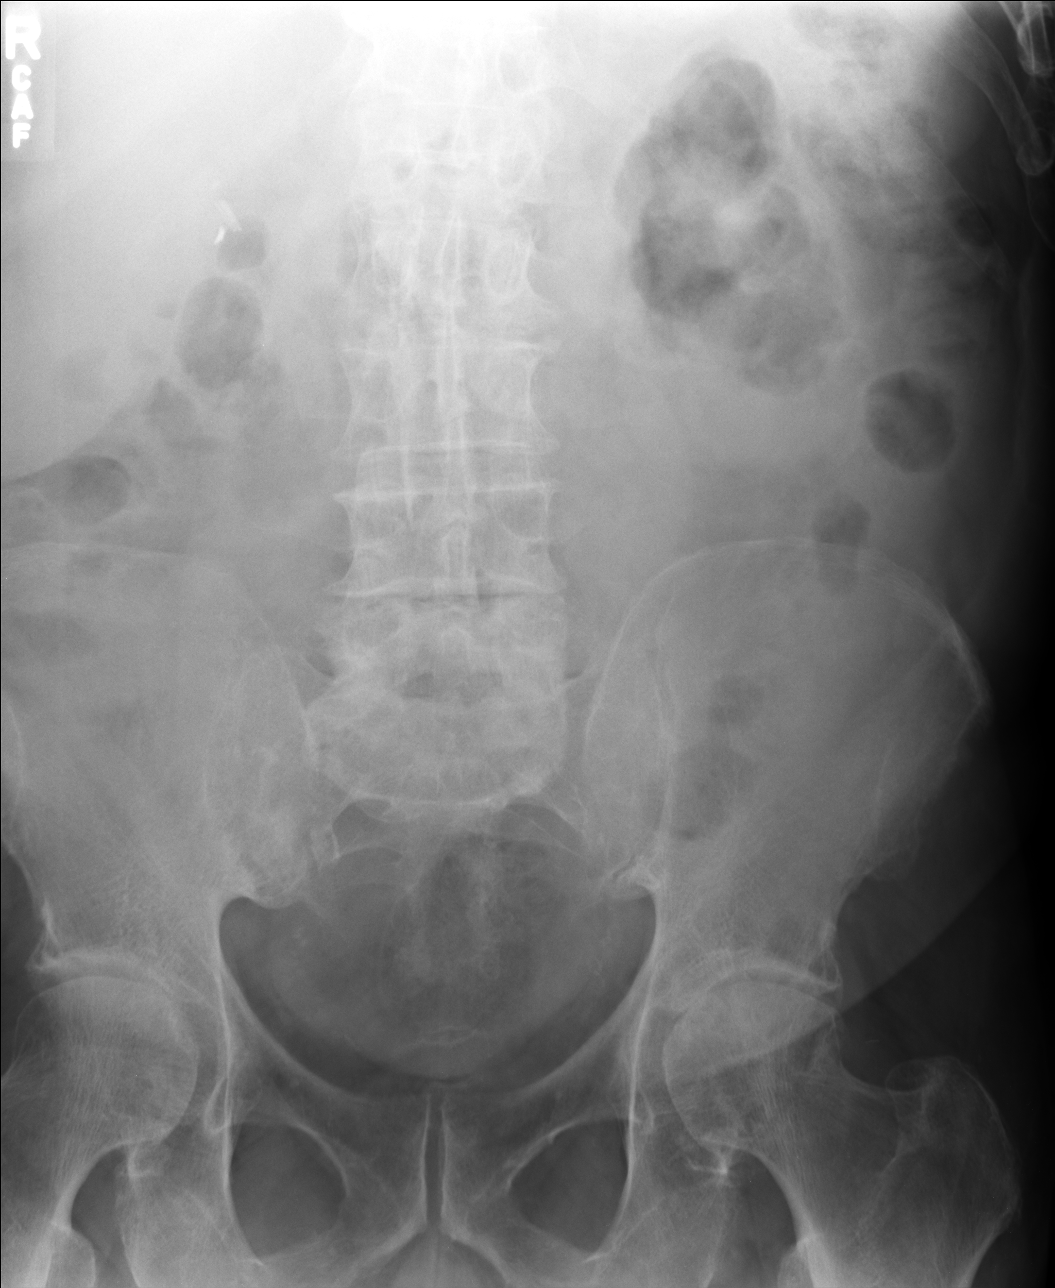

[AP (3 of 3)]
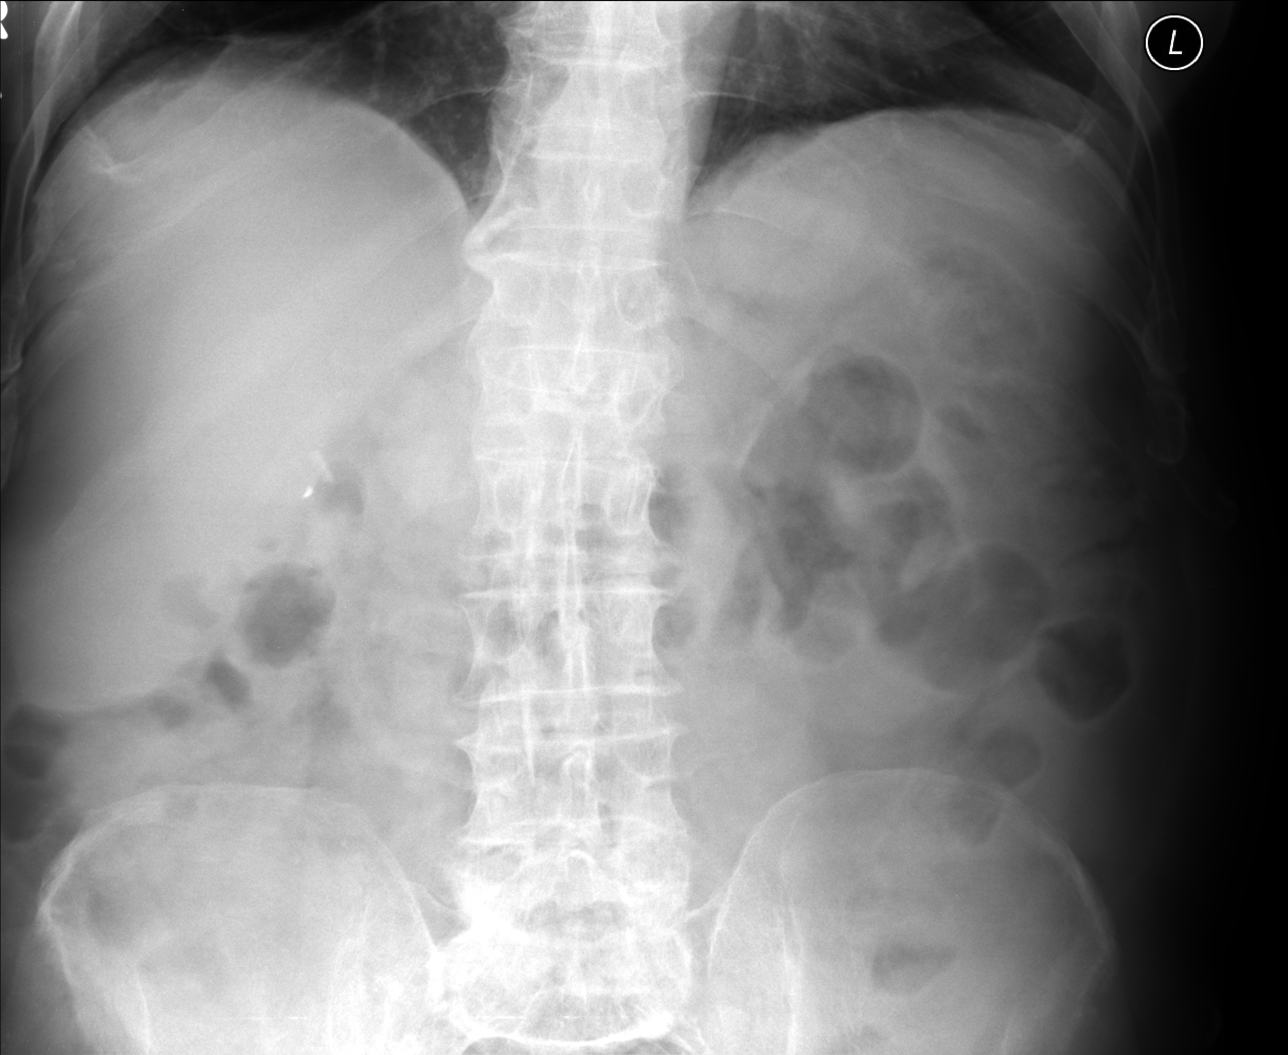

[4 of 4 positions shown; findings below may reference images not displayed]

FINDINGS: Normal bowel gas pattern. No obstruction or free air.

There has been a prior cholecystectomy. The soft tissues are
otherwise unremarkable.

The included frontal radiograph the chest shows mildly hyperexpanded
but clear lungs. The heart, mediastinum and hila are unremarkable.

The bony structures are demineralized with degenerative changes of
the spine. There are degenerative changes of the hips, greater on
the left.
IMPRESSION: No obstruction or acute finding.

## 2012-12-26 MED ORDER — SUCRALFATE 1 G PO TABS: 1.0000 g | ORAL_TABLET | Freq: Four times a day (QID) | ORAL | Status: DC

## 2012-12-26 NOTE — Patient Instructions (Addendum)
I will be in touch regarding the rest of your labs. Let me know if you are getting worse or have any other concerns

## 2012-12-26 NOTE — Progress Notes (Addendum)
[facility] [address] [place] [address] [phone]  Date:  12/26/2012   Name:  Justin [name]   DOB:  June 12, 1934   MRN:  [id number]  PCP:  Abbe Amsterdam, MD    Chief Complaint: Abdominal Pain   History of Present Illness:  Justin [name] is a 78 y.o. very pleasant male patient who presents with the following:  He is here today with "a stomach ache" for about one week.  "It's just sore."  His pain has come and gone. He is eating normally   No nausea or vomiting He has not noted a fever.   No urinary pain.   No dark stools, no tarry stools. No blood in his stool.   He has not noted constipation. He has a stool about 2x a day, can be a "little runny."  He has had trouble with chronic pancreatitis over the last year.  His last episode was in May of this year.    He was dx with diabetes in his 30s or 60s. We are his main caretaker with this issue.   He just had an A1c about 3 weeks ago- elevated at 9.2%    Patient Active Problem List   Diagnosis Date Noted  . Essential hypertension, benign 12/14/2012  . Pancreatitis, acute 06/02/2012  . Testosterone deficiency 10/22/2011  . Headache above the eye region 02/22/2011  . Diabetes mellitus type 1 02/22/2011  . TIA on medication 02/22/2011  . Coronary artery disease 02/22/2011    Past Medical History  Diagnosis Date  . HTN (hypertension)   . Diabetes mellitus   . Hypercholesteremia   . TIA (transient ischemic attack)   . Iron deficiency   . Arthritis   . Stroke     Past Surgical History  Procedure Laterality Date  . Back surgery  1985  . Neck surgery  1985  . Temporal artery biopsy / ligation  02/26/2011    left side  . Artery biopsy  02/26/2011    Procedure: BIOPSY TEMPORAL ARTERY;  Surgeon: Iona Coach, MD;  Location: [facility];  Service: General;  Laterality: Left;  Left temporal artery biospy    History  Substance Use Topics  . Smoking status: Former  Smoker -- 35 years    Types: Cigarettes    Quit date: 04/09/1990  . Smokeless tobacco: Never Used  . Alcohol Use: No     Comment: "per pt stopped drinking in the 90's"    Family History  Problem Relation Age of Onset  . Heart disease Mother     No Known Allergies  Medication list has been reviewed and updated.  Current Outpatient Prescriptions on File Prior to Visit  Medication Sig Dispense Refill  . ADVAIR DISKUS 250-50 MCG/DOSE AEPB INHALE ONE DOSE BY MOUTH INTO THE LUNGS TWICE DAILY  60 each  3  . atorvastatin (LIPITOR) 40 MG tablet Take 1 tablet (40 mg total) by mouth daily.  90 tablet  1  . clopidogrel (PLAVIX) 75 MG tablet Take 1 tablet (75 mg total) by mouth daily.  30 tablet  10  . EASY TOUCH TEST test strip CHECK BLOOD SUGAR THREE TIMES DAILY  100 each  0  . fish oil-omega-3 fatty acids 1000 MG capsule Take 2 g by mouth daily.        . Fluticasone-Salmeterol (ADVAIR) 250-50 MCG/DOSE AEPB Inhale 1 puff into the lungs as needed.       Marland Kitchen HYDROcodone-homatropine (HYCODAN) 5-1.5  MG/5ML syrup Take 5 mLs by mouth every 8 (eight) hours as needed for cough.  120 mL  1  . insulin aspart (NOVOLOG FLEXPEN) 100 UNIT/ML SOPN FlexPen Inject 15 Units into the skin 3 (three) times daily with meals.  5 pen  2  . insulin detemir (LEVEMIR) 100 UNIT/ML injection Inject 0.5 mLs (50 Units total) into the skin 2 (two) times daily.  10 mL  11  . Insulin Pen Needle 32G X 4 MM MISC Use as directed to inject insulin tidwc  100 each  11  . l-methylfolate-B6-B12 (METANX) 3-35-2 MG TABS Take 1 tablet by mouth daily.        . Lancets MISC To check blood glucose tid  100 each  5  . lipase/protease/amylase (CREON-12/PANCREASE) 12000 UNITS CPEP Take 2 capsules by mouth 3 (three) times daily with meals.  540 capsule  1  . lisinopril-hydrochlorothiazide (PRINZIDE,ZESTORETIC) 10-12.5 MG per tablet Take 1 tablet by mouth daily.  90 tablet  3  . metFORMIN (GLUCOPHAGE) 1000 MG tablet Take 1 tablet (1,000 mg total)  by mouth 2 (two) times daily with a meal.  180 tablet  3  . Multiple Vitamins-Minerals (MULTIVITAMIN WITH MINERALS) tablet Take 1 tablet by mouth daily.        Marland Kitchen omeprazole (PRILOSEC) 20 MG capsule TAKE ONE CAPSULE BY MOUTH EVERY DAY  90 capsule  1  . Pancrelipase, Lip-Prot-Amyl, 10500 UNITS CPEP Take 2 capsules (21,000 Units total) by mouth 3 x daily with food.  540 capsule  6  . Pancrelipase, Lip-Prot-Amyl, 5000 UNITS CPEP Take 1 capsule (5,000 Units total) by mouth daily.  30 capsule  11  . Vitamin D, Ergocalciferol, (DRISDOL) 50000 UNITS CAPS Take 50,000 Units by mouth.        . [DISCONTINUED] testosterone cypionate (DEPOTESTOTERONE CYPIONATE) 200 MG/ML injection Inject into the muscle every 14 (fourteen) days.       Current Facility-Administered Medications on File Prior to Visit  Medication Dose Route Frequency Provider Last Rate Last Dose  . testosterone cypionate (DEPOTESTOTERONE CYPIONATE) injection 200 mg  200 mg Intramuscular Q14 Days Elvina Sidle, MD   200 mg at 12/14/12 1610    Review of Systems:  As per HPI- otherwise negative. History of cholecyctectomy   Physical Examination: Filed Vitals:   12/26/12 1103  BP: 126/60  Pulse: 83  Temp: 97.9 F (36.6 C)  Resp: 18   Filed Vitals:   12/26/12 1103  Height: 5\' 9"  (1.753 m)  Weight: 201 lb (91.173 kg)   Body mass index is 29.67 kg/(m^2). Ideal Body Weight: Weight in (lb) to have BMI = 25: 168.9  GEN: WDWN, NAD, Non-toxic, A & O x 3, looks well HEENT: Atraumatic, Normocephalic. Neck supple. No masses, No LAD.  Bilateral TM wnl, oropharynx normal.  PEERL,EOMI.   Ears and Nose: No external deformity. CV: RRR, No M/G/R. No JVD. No thrill. No extra heart sounds. PULM: CTA B, no wheezes, crackles, rhonchi. No retractions. No resp. distress. No accessory muscle use. ABD: S,  Minimally distended, tender at epigastric area only. +BS. No rebound. No HSM. EXTR: No c/c/e NEURO Normal gait.  PSYCH: Normally interactive.  Conversant. Not depressed or anxious appearing.  Calm demeanor.  Rectal: no gross bleeding  [facility] reading (PRIMARY) by  Dr. Patsy Lager. abd series: plus air fluid levels ?suggestive of SOB.  Asked for STAT over- read  ACUTE ABDOMEN SERIES (ABDOMEN 2 VIEW & CHEST 1 VIEW)  COMPARISON: Chest radiograph, 12/07/2012  FINDINGS: Normal bowel gas pattern.  No obstruction or free air.  There has been a prior cholecystectomy. The soft tissues are otherwise unremarkable.  The included frontal radiograph the chest shows mildly hyperexpanded but clear lungs. The heart, mediastinum and hila are unremarkable.  The bony structures are demineralized with degenerative changes of the spine. There are degenerative changes of the hips, greater on the left.  IMPRESSION: No obstruction or acute finding  EKG:  NSR, no ST elevation or depression  Results for orders placed in visit on 12/26/12  POCT CBC      Result Value Range   WBC 10.0  4.6 - 10.2 K/uL   Lymph, poc 3.2  0.6 - 3.4   POC LYMPH PERCENT 31.9  10 - 50 %L   MID (cbc) 0.8  0 - 0.9   POC MID % 8.3  0 - 12 %M   POC Granulocyte 6.0  2 - 6.9   Granulocyte percent 59.8  37 - 80 %G   RBC 4.59 (*) 4.69 - 6.13 M/uL   Hemoglobin 13.3 (*) 14.1 - 18.1 g/dL   HCT, POC 16.1 (*) 09.6 - 53.7 %   MCV 91.4  80 - 97 fL   MCH, POC 29.0  27 - 31.2 pg   MCHC 31.7 (*) 31.8 - 35.4 g/dL   RDW, POC 04.5     Platelet Count, POC 345  142 - 424 K/uL   MPV 9.6  0 - 99.8 fL  IFOBT (OCCULT BLOOD)      Result Value Range   IFOBT Negative     He last received a testosterone shot on 9/7- he would like to know if he can get his shot today.  He is 2 days early so we will delay.   Last level was in June- slightly low Assessment and Plan: Abdominal pain, epigastric - Plan: Comprehensive metabolic panel, DG Abd Acute W/Chest, EKG 12-Lead, POCT CBC, IFOBT POC (occult bld, rslt in office), sucralfate (CARAFATE) 1 G tablet  Type II or unspecified type diabetes mellitus  with unspecified complication, uncontrolled - Plan: Microalbumin, urine  History of pancreatitis - Plan: Amylase, Lipase  Await pancreatic enzymes and will follow-up.  He is taking PO well and is not ill so doubt acute pancreatitis.  Add carafate for epigastric pain.  Continue prilosec.  He will let me know if anything is getting worse  Signed Abbe Amsterdam, MD  9/20: called to check on him and go over labs.  Left a message that his pancreatic enzymes look great.  Asked him to please let me know if not feeling better  Results for orders placed in visit on 12/26/12  COMPREHENSIVE METABOLIC PANEL      Result Value Range   Sodium 133 (*) 135 - 145 mEq/L   Potassium 4.1  3.5 - 5.3 mEq/L   Chloride 96  96 - 112 mEq/L   CO2 25  19 - 32 mEq/L   Glucose, Bld 185 (*) 70 - 99 mg/dL   BUN 15  6 - 23 mg/dL   Creat 4.09  8.11 - 9.14 mg/dL   Total Bilirubin 0.8  0.3 - 1.2 mg/dL   Alkaline Phosphatase 69  39 - 117 U/L   AST 16  0 - 37 U/L   ALT 15  0 - 53 U/L   Total Protein 8.1  6.0 - 8.3 g/dL   Albumin 3.8  3.5 - 5.2 g/dL   Calcium 9.3  8.4 - 78.2 mg/dL  AMYLASE  Result Value Range   Amylase 31  0 - 105 U/L  LIPASE      Result Value Range   Lipase 24  0 - 75 U/L  MICROALBUMIN, URINE      Result Value Range   Microalb, Ur 25.81 (*) 0.00 - 1.89 mg/dL  POCT CBC      Result Value Range   WBC 10.0  4.6 - 10.2 K/uL   Lymph, poc 3.2  0.6 - 3.4   POC LYMPH PERCENT 31.9  10 - 50 %L   MID (cbc) 0.8  0 - 0.9   POC MID % 8.3  0 - 12 %M   POC Granulocyte 6.0  2 - 6.9   Granulocyte percent 59.8  37 - 80 %G   RBC 4.59 (*) 4.69 - 6.13 M/uL   Hemoglobin 13.3 (*) 14.1 - 18.1 g/dL   HCT, POC 09.8 (*) 11.9 - 53.7 %   MCV 91.4  80 - 97 fL   MCH, POC 29.0  27 - 31.2 pg   MCHC 31.7 (*) 31.8 - 35.4 g/dL   RDW, POC 14.7     Platelet Count, POC 345  142 - 424 K/uL   MPV 9.6  0 - 99.8 fL  IFOBT (OCCULT BLOOD)      Result Value Range   IFOBT Negative

## 2012-12-27 ENCOUNTER — Telehealth: Payer: Self-pay

## 2012-12-27 LAB — COMPREHENSIVE METABOLIC PANEL
ALT: 15 U/L (ref 0–53)
AST: 16 U/L (ref 0–37)
Alkaline Phosphatase: 69 U/L (ref 39–117)
Potassium: 4.1 mEq/L (ref 3.5–5.3)
Sodium: 133 mEq/L — ABNORMAL LOW (ref 135–145)
Total Bilirubin: 0.8 mg/dL (ref 0.3–1.2)
Total Protein: 8.1 g/dL (ref 6.0–8.3)

## 2012-12-27 LAB — LIPASE: Lipase: 24 U/L (ref 0–75)

## 2012-12-27 NOTE — Telephone Encounter (Signed)
Patient wanting to know if lab results back. Patient was told not to fill RX until labs back. Please give pt call back at [phone].  Pharmacy [facility]

## 2012-12-29 NOTE — Telephone Encounter (Signed)
Cell # does not seem to work.  Called and LM on home number again.  Labs look good, he can use the medication that I gave him at our visit

## 2012-12-30 ENCOUNTER — Ambulatory Visit (INDEPENDENT_AMBULATORY_CARE_PROVIDER_SITE_OTHER): Payer: Medicare Other | Admitting: Family Medicine

## 2012-12-30 ENCOUNTER — Encounter: Payer: Self-pay | Admitting: Family Medicine

## 2012-12-30 ENCOUNTER — Other Ambulatory Visit: Payer: Self-pay | Admitting: Family Medicine

## 2012-12-30 VITALS — BP 120/70 | HR 79 | Temp 97.6°F | Resp 16 | Ht 68.25 in | Wt 202.0 lb

## 2012-12-30 DIAGNOSIS — G8929 Other chronic pain: Secondary | ICD-10-CM

## 2012-12-30 DIAGNOSIS — R1013 Epigastric pain: Secondary | ICD-10-CM

## 2012-12-30 DIAGNOSIS — E291 Testicular hypofunction: Secondary | ICD-10-CM

## 2012-12-30 DIAGNOSIS — D649 Anemia, unspecified: Secondary | ICD-10-CM

## 2012-12-30 MED ORDER — TESTOSTERONE CYPIONATE 200 MG/ML IM SOLN
200.0000 mg | Freq: Once | INTRAMUSCULAR | Status: AC
  Administered 2012-12-30: 200 mg via INTRAMUSCULAR

## 2012-12-30 NOTE — Progress Notes (Signed)
[facility] [address], [place] [address] [phone]  Date:  12/30/2012   Name:  Justin [name]   DOB:  25-Dec-1934   MRN:  [id number]  PCP:  Abbe Amsterdam, MD    Chief Complaint: Follow-up and Injections   History of Present Illness:  Justin [name] is a 78 y.o. very pleasant male patient who presents with the following:  Here today to follow-up.  He is also due for his testosterone shot today.  Last shot on 9/7.   He was here on the 19th with epigastric pain.  He was concerned about recurrent pancreatitis, but his enzymes were negative He was given carafate and also uses his usual prilosec.  Overall he is better.  The pain seems to occur least at night.  It is slightly worse after he eats.    He has not noted any vomiting or diarrhea.  He does not have a whole lot of appetite, but this is normal for him.   He does feel that his energy level is getting better, and this his abdomianl sx are improving.  He is sometimes frustrated that his energy level is not as good as when he was younger; he used to be able to work and "run around all day."  His wife reminds him that "You're not 58 years old anymore either."    Patient Active Problem List   Diagnosis Date Noted  . Essential hypertension, benign 12/14/2012  . Pancreatitis, acute 06/02/2012  . Testosterone deficiency 10/22/2011  . Headache above the eye region 02/22/2011  . Type II or unspecified type diabetes mellitus with unspecified complication, uncontrolled 02/22/2011  . TIA on medication 02/22/2011  . Coronary artery disease 02/22/2011    Past Medical History  Diagnosis Date  . HTN (hypertension)   . Diabetes mellitus   . Hypercholesteremia   . TIA (transient ischemic attack)   . Iron deficiency   . Arthritis   . Stroke     Past Surgical History  Procedure Laterality Date  . Back surgery  1985  . Neck surgery  1985  . Temporal artery biopsy / ligation  02/26/2011    left side  .  Artery biopsy  02/26/2011    Procedure: BIOPSY TEMPORAL ARTERY;  Surgeon: Iona Coach, MD;  Location: [facility];  Service: General;  Laterality: Left;  Left temporal artery biospy    History  Substance Use Topics  . Smoking status: Former Smoker -- 35 years    Types: Cigarettes    Quit date: 04/09/1990  . Smokeless tobacco: Never Used  . Alcohol Use: No     Comment: "per pt stopped drinking in the 90's"    Family History  Problem Relation Age of Onset  . Heart disease Mother     No Known Allergies  Medication list has been reviewed and updated.  Current Outpatient Prescriptions on File Prior to Visit  Medication Sig Dispense Refill  . ADVAIR DISKUS 250-50 MCG/DOSE AEPB INHALE ONE DOSE BY MOUTH INTO THE LUNGS TWICE DAILY  60 each  3  . atorvastatin (LIPITOR) 40 MG tablet Take 1 tablet (40 mg total) by mouth daily.  90 tablet  1  . clopidogrel (PLAVIX) 75 MG tablet Take 1 tablet (75 mg total) by mouth daily.  30 tablet  10  . EASY TOUCH TEST test strip CHECK BLOOD SUGAR THREE TIMES DAILY  100 each  0  . fish oil-omega-3 fatty acids 1000 MG capsule  Take 2 g by mouth daily.        . Fluticasone-Salmeterol (ADVAIR) 250-50 MCG/DOSE AEPB Inhale 1 puff into the lungs as needed.       Marland Kitchen HYDROcodone-homatropine (HYCODAN) 5-1.5 MG/5ML syrup Take 5 mLs by mouth every 8 (eight) hours as needed for cough.  120 mL  1  . insulin aspart (NOVOLOG FLEXPEN) 100 UNIT/ML SOPN FlexPen Inject 15 Units into the skin 3 (three) times daily with meals.  5 pen  2  . insulin detemir (LEVEMIR) 100 UNIT/ML injection Inject 0.5 mLs (50 Units total) into the skin 2 (two) times daily.  10 mL  11  . Insulin Pen Needle 32G X 4 MM MISC Use as directed to inject insulin tidwc  100 each  11  . l-methylfolate-B6-B12 (METANX) 3-35-2 MG TABS Take 1 tablet by mouth daily.        . Lancets MISC To check blood glucose tid  100 each  5  . lipase/protease/amylase (CREON-12/PANCREASE) 12000 UNITS CPEP  Take 2 capsules by mouth 3 (three) times daily with meals.  540 capsule  1  . lisinopril-hydrochlorothiazide (PRINZIDE,ZESTORETIC) 10-12.5 MG per tablet Take 1 tablet by mouth daily.  90 tablet  3  . metFORMIN (GLUCOPHAGE) 1000 MG tablet Take 1 tablet (1,000 mg total) by mouth 2 (two) times daily with a meal.  180 tablet  3  . Multiple Vitamins-Minerals (MULTIVITAMIN WITH MINERALS) tablet Take 1 tablet by mouth daily.        Marland Kitchen omeprazole (PRILOSEC) 20 MG capsule TAKE ONE CAPSULE BY MOUTH EVERY DAY  90 capsule  1  . Pancrelipase, Lip-Prot-Amyl, 10500 UNITS CPEP Take 2 capsules (21,000 Units total) by mouth 3 x daily with food.  540 capsule  6  . Pancrelipase, Lip-Prot-Amyl, 5000 UNITS CPEP Take 1 capsule (5,000 Units total) by mouth daily.  30 capsule  11  . sucralfate (CARAFATE) 1 G tablet Take 1 tablet (1 g total) by mouth 4 (four) times daily.  40 tablet  0  . Vitamin D, Ergocalciferol, (DRISDOL) 50000 UNITS CAPS Take 50,000 Units by mouth.        . [DISCONTINUED] testosterone cypionate (DEPOTESTOTERONE CYPIONATE) 200 MG/ML injection Inject into the muscle every 14 (fourteen) days.       Current Facility-Administered Medications on File Prior to Visit  Medication Dose Route Frequency Provider Last Rate Last Dose  . testosterone cypionate (DEPOTESTOTERONE CYPIONATE) injection 200 mg  200 mg Intramuscular Q14 Days Elvina Sidle, MD   200 mg at 12/14/12 1610    Review of Systems:  As per HPI- otherwise negative.   Physical Examination: Filed Vitals:   12/30/12 0911  BP: 120/70  Pulse: 79  Temp: 97.6 F (36.4 C)  Resp: 16   Filed Vitals:   12/30/12 0911  Height: 5' 8.25" (1.734 m)  Weight: 202 lb (91.627 kg)   Body mass index is 30.47 kg/(m^2). Ideal Body Weight: Weight in (lb) to have BMI = 25: 165.3  GEN: WDWN, NAD, Non-toxic, A & O x 3, looks well, overweight HEENT: Atraumatic, Normocephalic. Neck supple. No masses, No LAD. Ears and Nose: No external deformity. CV: RRR, No  M/G/R. No JVD. No thrill. No extra heart sounds. PULM: CTAB, no wheezes, crackles, rhonchi. No retractions. No resp. distress. No accessory muscle use. ABD: S,  ND, +BS. No rebound. No HSM.  Minimal epigastric tenderness EXTR: No c/c/e NEURO Normal gait.  PSYCH: Normally interactive. Conversant. Not depressed or anxious appearing.  Calm demeanor.   Assessment and  Plan: Hypogonadism male - Plan: testosterone cypionate (DEPOTESTOTERONE CYPIONATE) injection 200 mg  Abdominal pain, chronic, epigastric  Anemia  Testosterone shot today Abdominal pain is better.  Suspect due to gastritis. Continue carafate as needed.  If sx return please let me know Anemia: reviewed old chart.  He is coming due for a colonoscopy- it appears his last colonoscopy was in 2005 according to his old chart but I do not have this actual report.  Will arrange this for him to ensure anemia not due to blood losses from the colon.    Signed Abbe Amsterdam, MD

## 2012-12-31 ENCOUNTER — Ambulatory Visit: Payer: Medicare Other | Admitting: Endocrinology

## 2012-12-31 DIAGNOSIS — Z0289 Encounter for other administrative examinations: Secondary | ICD-10-CM

## 2013-01-03 ENCOUNTER — Ambulatory Visit (INDEPENDENT_AMBULATORY_CARE_PROVIDER_SITE_OTHER): Payer: Medicare Other | Admitting: Family Medicine

## 2013-01-03 ENCOUNTER — Ambulatory Visit (HOSPITAL_COMMUNITY)
Admission: RE | Admit: 2013-01-03 | Discharge: 2013-01-03 | Disposition: A | Discharge status: Home / Self Care | Payer: Medicare Other | Source: Ambulatory Visit | Attending: Family Medicine | Admitting: Family Medicine

## 2013-01-03 VITALS — BP 130/76 | HR 92 | Temp 98.1°F | Resp 18 | Ht 68.5 in | Wt 199.2 lb

## 2013-01-03 DIAGNOSIS — D649 Anemia, unspecified: Secondary | ICD-10-CM

## 2013-01-03 DIAGNOSIS — G8929 Other chronic pain: Secondary | ICD-10-CM

## 2013-01-03 DIAGNOSIS — R160 Hepatomegaly, not elsewhere classified: Secondary | ICD-10-CM | POA: Insufficient documentation

## 2013-01-03 DIAGNOSIS — M169 Osteoarthritis of hip, unspecified: Secondary | ICD-10-CM | POA: Insufficient documentation

## 2013-01-03 DIAGNOSIS — M1610 Unilateral primary osteoarthritis, unspecified hip: Secondary | ICD-10-CM | POA: Insufficient documentation

## 2013-01-03 DIAGNOSIS — R109 Unspecified abdominal pain: Secondary | ICD-10-CM | POA: Insufficient documentation

## 2013-01-03 DIAGNOSIS — R1011 Right upper quadrant pain: Secondary | ICD-10-CM

## 2013-01-03 IMAGING — CT CT ABD-PELV W/ CM
2 of 5 series · 17 of 46 positions shown, 19 images · IV contrast (OMNIPAQUE)
Comparison: None.

CLINICAL DATA: Epigastric and upper abdominal pain.

EXAM:
CT ABDOMEN AND PELVIS WITH CONTRAST
TECHNIQUE: Multidetector CT imaging of the abdomen and pelvis was performed
using the standard protocol following bolus administration of
intravenous contrast.
CONTRAST:  50mL OMNIPAQUE IOHEXOL 300 MG/ML SOLN, 100mL OMNIPAQUE
IOHEXOL 300 MG/ML SOLN

[Series 2: rtn a/p with · axial · 0.92mm/px · z∈[-476,-76]mm · 14 of 91 slices shown, 16 images]
[im 6/91  soft-tissue]
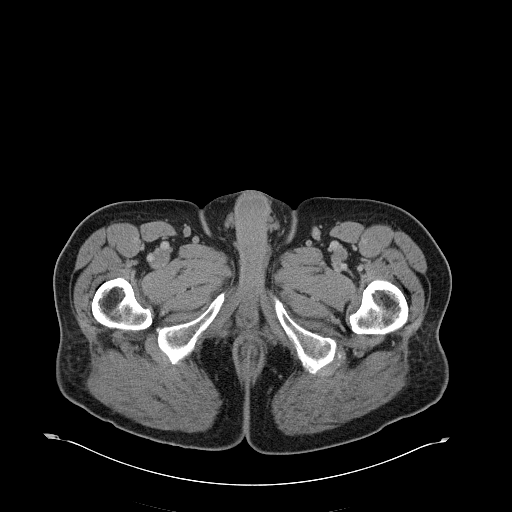
[im 6/91  bone]
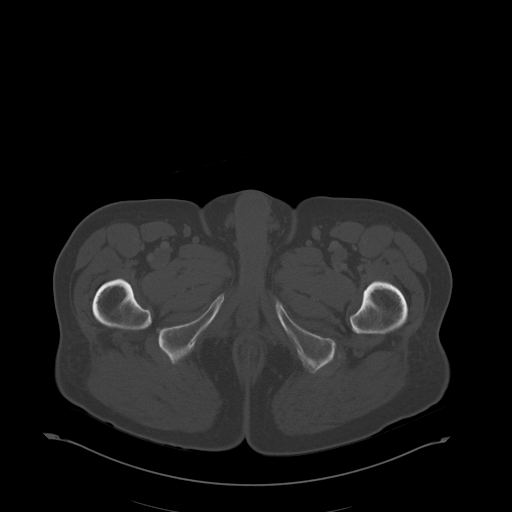
[im 11/91  soft-tissue]
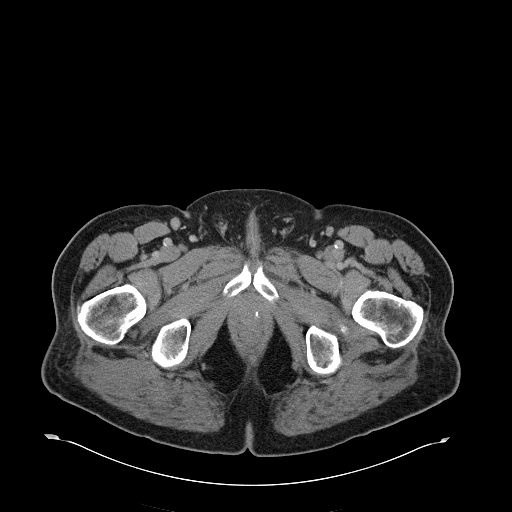
[im 21/91  soft-tissue]
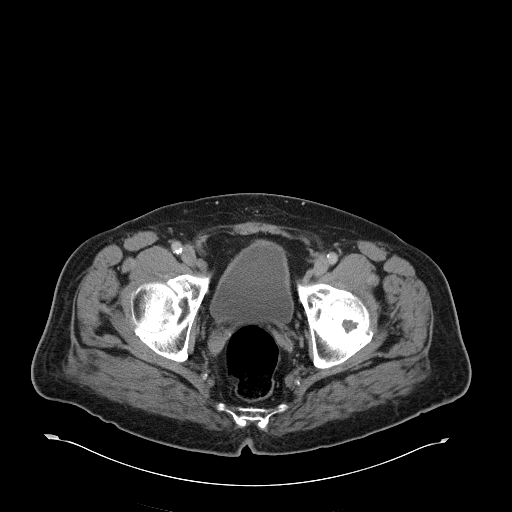
[im 26/91  soft-tissue]
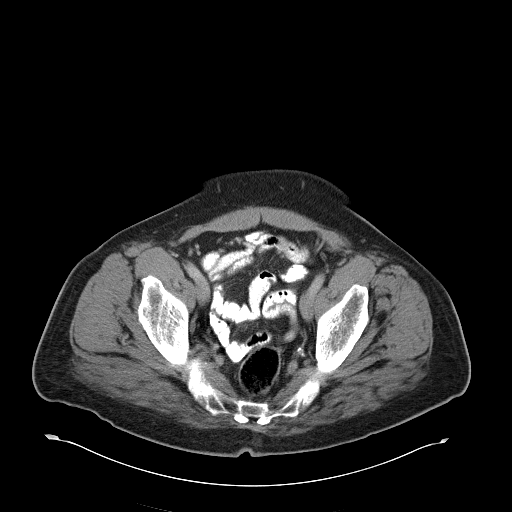
[im 31/91  soft-tissue]
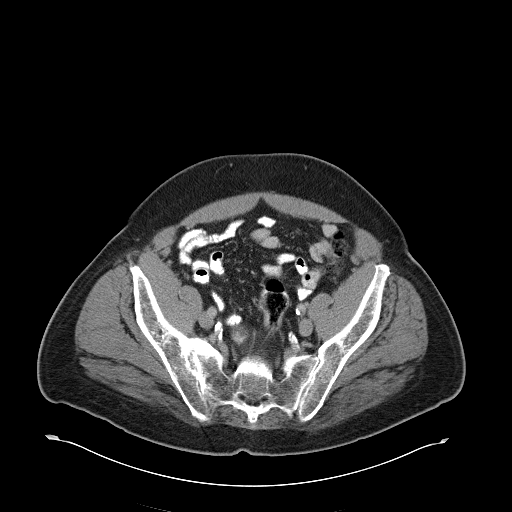
[im 36/91  soft-tissue]
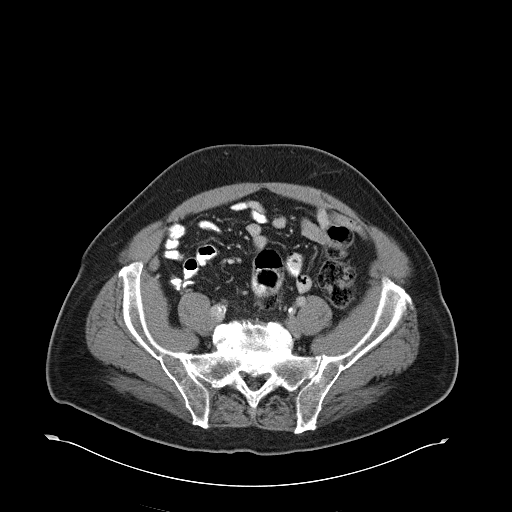
[im 41/91  soft-tissue]
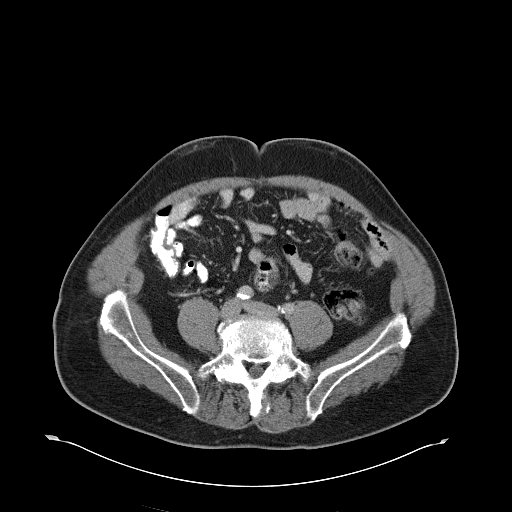
[im 51/91  soft-tissue]
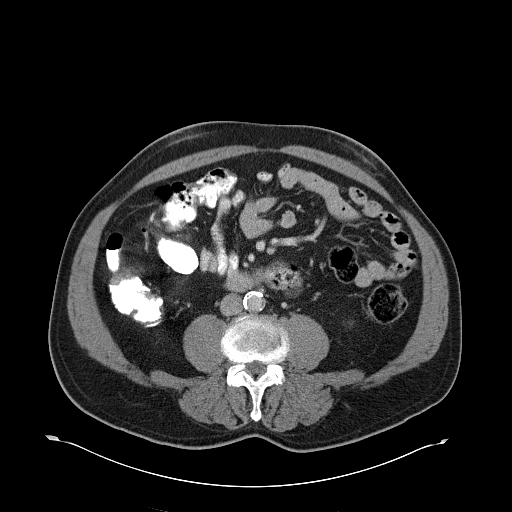
[im 56/91  soft-tissue]
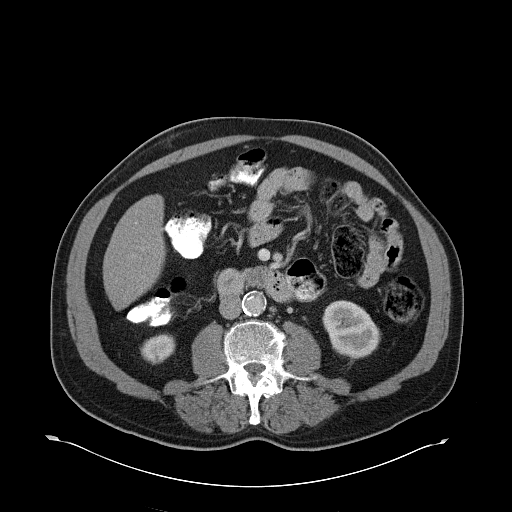
[im 56/91  bone]
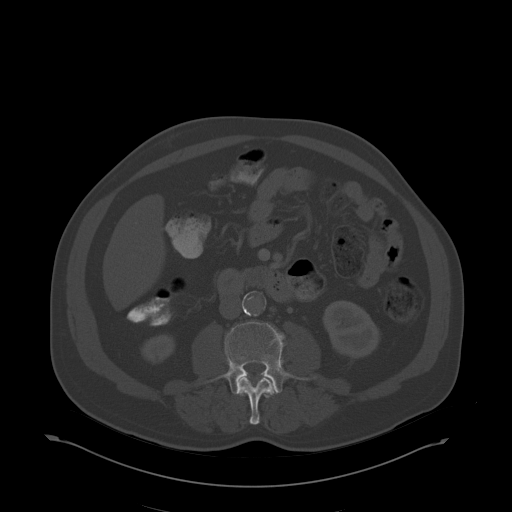
[im 61/91  soft-tissue]
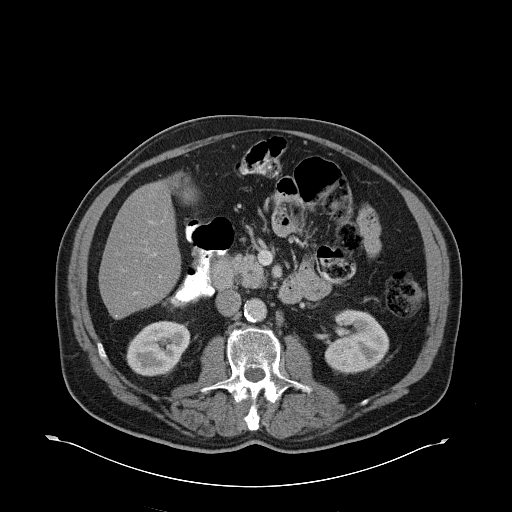
[im 66/91  soft-tissue]
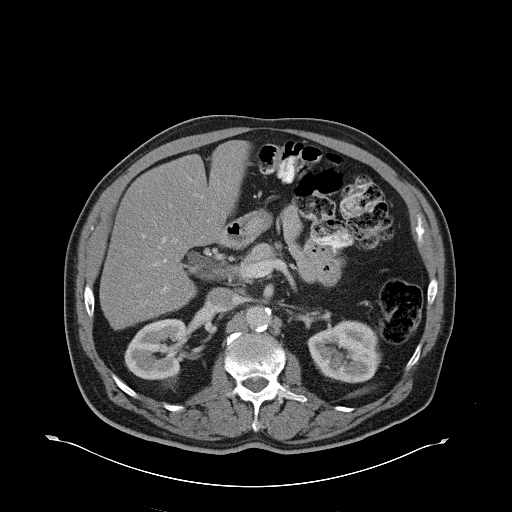
[im 71/91  soft-tissue]
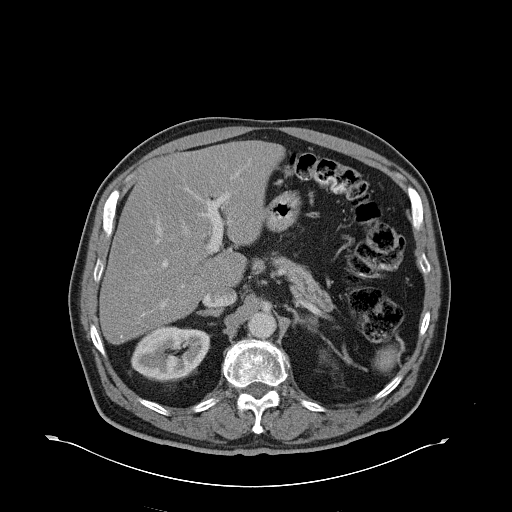
[im 81/91  soft-tissue]
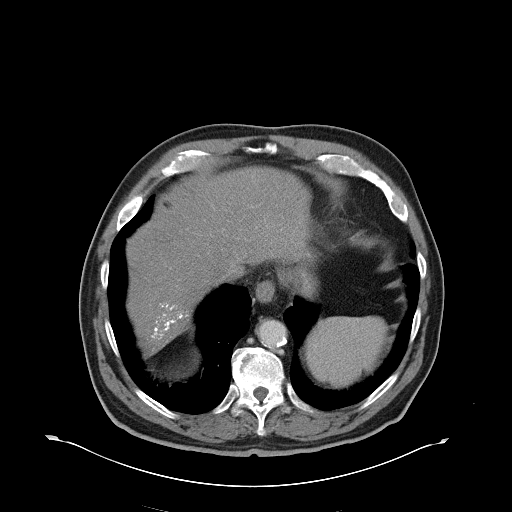
[im 86/91  soft-tissue]
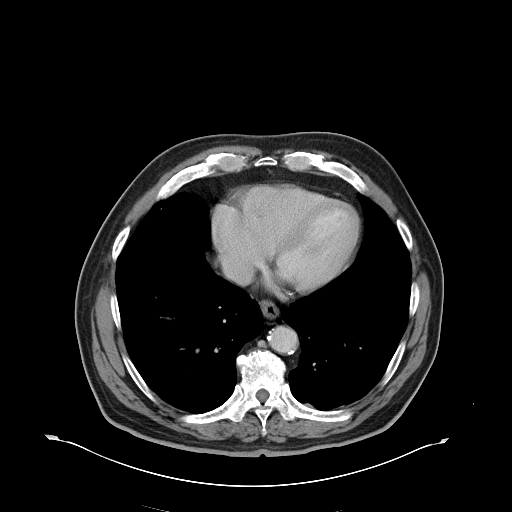

[Series 5: coronal · coronal · 0.89mm/px · 3 of 164 slices shown]
[im 55/164  soft-tissue]
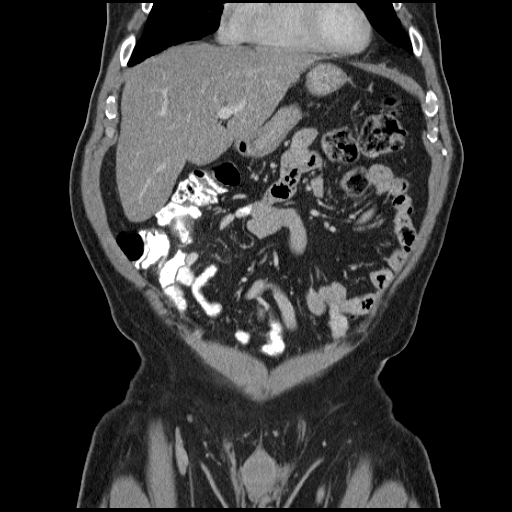
[im 73/164  soft-tissue]
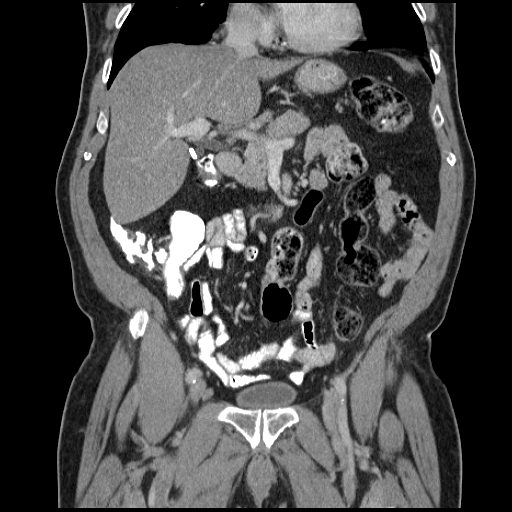
[im 91/164  soft-tissue]
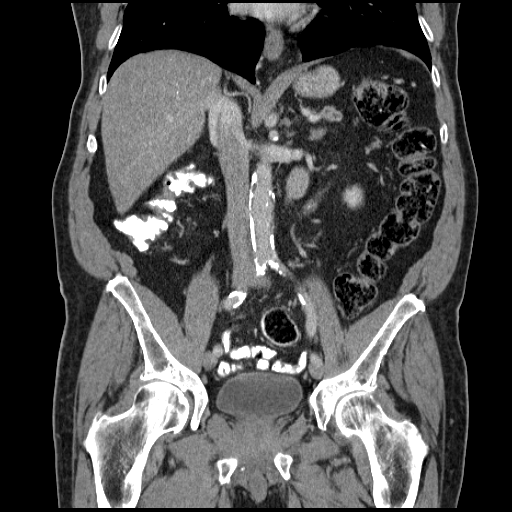

[17 of 46 positions shown; findings below may reference images not displayed]

FINDINGS: Hypertrophy of the caudate and left hepatic lobe seen, suspicious
for hepatic cirrhosis. No evidence of ascites. Prior cholecystectomy
noted. No evidence of biliary ductal dilatation.

Multiple tiny parenchymal calcifications are noted in the posterior
right hepatic lobe, but no liver masses are identified. The
pancreas, spleen, adrenal glands, and kidneys are normal in
appearance. No soft tissue masses or lymphadenopathy identified
within the abdomen or pelvis.

No evidence of inflammatory process or abnormal fluid collections.
No evidence of bowel wall thickening, dilatation, or hernia. Lumbar
spine degenerative changes noted as well as old compression fracture
of the L1 vertebral body. Bilateral hip osteoarthritis also seen,
left side greater than right.
IMPRESSION: No acute findings.

Probable hepatic cirrhosis. Recommend correlation with liver
function tests.

## 2013-01-03 MED ORDER — CYANOCOBALAMIN 1000 MCG/ML IJ SOLN
1000.0000 ug | Freq: Once | INTRAMUSCULAR | Status: AC
  Administered 2013-01-03: 1000 ug via INTRAMUSCULAR

## 2013-01-03 MED ORDER — IOHEXOL 300 MG/ML  SOLN
100.0000 mL | Freq: Once | INTRAMUSCULAR | Status: AC | PRN
  Administered 2013-01-03: 100 mL via INTRAVENOUS

## 2013-01-03 MED ORDER — IOHEXOL 300 MG/ML  SOLN
50.0000 mL | Freq: Once | INTRAMUSCULAR | Status: AC | PRN
  Administered 2013-01-03: 50 mL via ORAL

## 2013-01-03 NOTE — Progress Notes (Signed)
77 yo gentleman who has persistent epigastric pain despite PPI and sucralfate Rx.  No nausea or vomiting, but he just feels "give out."  Objective:  NAD Chest clear Heart regular with faint ejection type murmur Abdomen:  Fullness RUQ with tenderness epigastrium, no rebound Skin:  No jaundice or rash  Results for orders placed in visit on 12/26/12  COMPREHENSIVE METABOLIC PANEL      Result Value Range   Sodium 133 (*) 135 - 145 mEq/L   Potassium 4.1  3.5 - 5.3 mEq/L   Chloride 96  96 - 112 mEq/L   CO2 25  19 - 32 mEq/L   Glucose, Bld 185 (*) 70 - 99 mg/dL   BUN 15  6 - 23 mg/dL   Creat 1.61  0.96 - 0.45 mg/dL   Total Bilirubin 0.8  0.3 - 1.2 mg/dL   Alkaline Phosphatase 69  39 - 117 U/L   AST 16  0 - 37 U/L   ALT 15  0 - 53 U/L   Total Protein 8.1  6.0 - 8.3 g/dL   Albumin 3.8  3.5 - 5.2 g/dL   Calcium 9.3  8.4 - 40.9 mg/dL  AMYLASE      Result Value Range   Amylase 31  0 - 105 U/L  LIPASE      Result Value Range   Lipase 24  0 - 75 U/L  MICROALBUMIN, URINE      Result Value Range   Microalb, Ur 25.81 (*) 0.00 - 1.89 mg/dL  POCT CBC      Result Value Range   WBC 10.0  4.6 - 10.2 K/uL   Lymph, poc 3.2  0.6 - 3.4   POC LYMPH PERCENT 31.9  10 - 50 %L   MID (cbc) 0.8  0 - 0.9   POC MID % 8.3  0 - 12 %M   POC Granulocyte 6.0  2 - 6.9   Granulocyte percent 59.8  37 - 80 %G   RBC 4.59 (*) 4.69 - 6.13 M/uL   Hemoglobin 13.3 (*) 14.1 - 18.1 g/dL   HCT, POC 81.1 (*) 91.4 - 53.7 %   MCV 91.4  80 - 97 fL   MCH, POC 29.0  27 - 31.2 pg   MCHC 31.7 (*) 31.8 - 35.4 g/dL   RDW, POC 78.2     Platelet Count, POC 345  142 - 424 K/uL   MPV 9.6  0 - 99.8 fL  IFOBT (OCCULT BLOOD)      Result Value Range   IFOBT Negative     Assessment: Persistent epigastric pain for one month with no etiology determined and no improvement with antireflux and anti-acid remedies.  Plan: CT with contrast of the abdomen today  Addendum: Some hepatic cirrhosis seen but no masses or other explanation  for the epigastric pain.  Plan: Recheck in one week, continue current medications. Consider discontinuing the statin medication.  Signed, Sheila Oats.D.

## 2013-01-06 ENCOUNTER — Telehealth: Payer: Self-pay | Admitting: Physician Assistant

## 2013-01-06 NOTE — Telephone Encounter (Signed)
Received fax from pharmacy stating pt had received both simvastatin and atorvastatin, concern for duplicate therapy.  On chart review, simvastatin was supposed to be discontinued 12/14/12 per Dr. Alver Fisher note.  Please make sure pt is aware that he is to be taking atorvastatin NOT simvastatin and NOT both.

## 2013-01-07 NOTE — Telephone Encounter (Signed)
Thank you. I have called him. His numbers are not correct. I did call pharmacy, to make sure they are aware.

## 2013-01-16 ENCOUNTER — Encounter: Payer: Self-pay | Admitting: Internal Medicine

## 2013-01-20 ENCOUNTER — Ambulatory Visit (INDEPENDENT_AMBULATORY_CARE_PROVIDER_SITE_OTHER): Payer: Medicare Other | Admitting: Family Medicine

## 2013-01-20 VITALS — BP 114/62 | HR 99 | Temp 98.3°F | Resp 18

## 2013-01-20 DIAGNOSIS — E291 Testicular hypofunction: Secondary | ICD-10-CM

## 2013-01-20 NOTE — Progress Notes (Signed)
  Subjective:    Patient ID: Justin [name], male    DOB: 1935-02-27, 78 y.o.   MRN: [id number]  HPI Patient here today for Testosterone injection only.   Review of Systems     Objective:   Physical Exam        Assessment & Plan:

## 2013-02-02 ENCOUNTER — Ambulatory Visit (INDEPENDENT_AMBULATORY_CARE_PROVIDER_SITE_OTHER): Payer: Medicare Other | Admitting: Family Medicine

## 2013-02-02 VITALS — BP 132/68 | HR 89 | Temp 98.3°F | Resp 18 | Ht 68.5 in | Wt 204.0 lb

## 2013-02-02 DIAGNOSIS — R51 Headache: Secondary | ICD-10-CM

## 2013-02-02 DIAGNOSIS — R42 Dizziness and giddiness: Secondary | ICD-10-CM

## 2013-02-02 DIAGNOSIS — IMO0001 Reserved for inherently not codable concepts without codable children: Secondary | ICD-10-CM

## 2013-02-02 MED ORDER — MECLIZINE HCL 12.5 MG PO TABS: ORAL_TABLET | ORAL | Status: DC

## 2013-02-02 MED ORDER — TRAMADOL HCL 50 MG PO TABS: 50.0000 mg | ORAL_TABLET | Freq: Three times a day (TID) | ORAL | Status: DC | PRN

## 2013-02-02 NOTE — Patient Instructions (Signed)
Take tramadol for headache only when needed  Take meclizine for dizziness only when needed  Return in early January for followup visit for the diabetes  Return sooner if problems

## 2013-02-02 NOTE — Progress Notes (Signed)
Subjective: 78 year old man who has been having a headache today. He's been having some dizziness from time to time lately. He was fine yesterday, felt good and spent the day at church. He is retired since I saw him last, and life has had less stress. He's been doing better. The headache is primarily frontal and around the eyes. Blood sugar was a little high this morning around 230. That is worse than usual for him. He says his sugar sometimes gets down to around 79 or 80.  Objective: TMs normal. Eyes PERRLA. Throat clear. Neck supple without carotid bruits. Chest clear. Heart regular without murmurs. Minimal edema.  Assessment: Headache Intermittent dizziness Diabetes, variable control  Plan: Tramadol if needed for headache Meclizine if needed for dizziness Return if symptoms get worse

## 2013-02-11 ENCOUNTER — Other Ambulatory Visit: Payer: Self-pay

## 2013-02-11 MED ORDER — GLUCOSE BLOOD VI STRP: ORAL_STRIP | Status: DC

## 2013-02-19 ENCOUNTER — Ambulatory Visit (INDEPENDENT_AMBULATORY_CARE_PROVIDER_SITE_OTHER): Payer: Medicare Other | Admitting: Family Medicine

## 2013-02-19 VITALS — BP 126/78 | HR 90 | Temp 98.2°F | Resp 18 | Ht 68.5 in | Wt 199.0 lb

## 2013-02-19 DIAGNOSIS — R51 Headache: Secondary | ICD-10-CM

## 2013-02-19 DIAGNOSIS — E871 Hypo-osmolality and hyponatremia: Secondary | ICD-10-CM

## 2013-02-19 DIAGNOSIS — E291 Testicular hypofunction: Secondary | ICD-10-CM

## 2013-02-19 DIAGNOSIS — E1065 Type 1 diabetes mellitus with hyperglycemia: Secondary | ICD-10-CM

## 2013-02-19 DIAGNOSIS — R42 Dizziness and giddiness: Secondary | ICD-10-CM

## 2013-02-19 LAB — POCT CBC
HCT, POC: 43.2 % — AB (ref 43.5–53.7)
Lymph, poc: 2.7 (ref 0.6–3.4)
MCH, POC: 27.4 pg (ref 27–31.2)
MCHC: 31.3 g/dL — AB (ref 31.8–35.4)
MCV: 87.6 fL (ref 80–97)
POC Granulocyte: 5.5 (ref 2–6.9)
POC LYMPH PERCENT: 30.5 %L (ref 10–50)
Platelet Count, POC: 307 10*3/uL (ref 142–424)
RDW, POC: 15 %

## 2013-02-19 LAB — COMPREHENSIVE METABOLIC PANEL
Alkaline Phosphatase: 87 U/L (ref 39–117)
BUN: 27 mg/dL — ABNORMAL HIGH (ref 6–23)
CO2: 24 mEq/L (ref 19–32)
Chloride: 98 mEq/L (ref 96–112)
Creat: 1.25 mg/dL (ref 0.50–1.35)
Glucose, Bld: 130 mg/dL — ABNORMAL HIGH (ref 70–99)
Total Bilirubin: 0.8 mg/dL (ref 0.3–1.2)

## 2013-02-19 LAB — C-REACTIVE PROTEIN: CRP: 0.5 mg/dL (ref <0.60)

## 2013-02-19 LAB — POCT SEDIMENTATION RATE: POCT SED RATE: 68 mm/hr — AB (ref 0–22)

## 2013-02-19 LAB — POCT GLYCOSYLATED HEMOGLOBIN (HGB A1C): Hemoglobin A1C: 8.2

## 2013-02-19 MED ORDER — TESTOSTERONE CYPIONATE 200 MG/ML IM SOLN
200.0000 mg | Freq: Once | INTRAMUSCULAR | Status: AC
  Administered 2013-02-19: 200 mg via INTRAMUSCULAR

## 2013-02-19 NOTE — Progress Notes (Addendum)
[facility] [address], [place] [address] [id number] [phone]  Date:  02/19/2013   Name:  Justin [name]   DOB:  [birth year]   MRN:  [id number]  PCP:  Abbe Amsterdam, MD    Chief Complaint: Dizziness, Nausea and Fatigue   History of Present Illness:  Justin [name] is a 78 y.o. very pleasant male patient who presents with the following:  History of DM, HTN, TIA, CAD.  He was here about 2 weeks ago and saw Dr. Alwyn Ren with headache and dizziness.  He has had the dizziness for a couple of weeks.  He has noted an intermittent feeling of being off balance that can last for several hours.  He is still able to walk and move normally.  No falls.    He still notes a "slight headache" over the left temple.  This hurts more if he coughs.   He has noted some blurriness in his vision for a couple of weeks as well.  This is present more in the right eye.  He had a cataract removed from his right eye a few months ago.  [facility]o [facility].   He did have some pain in his temple in the past; was through to possibly have TA but his bx was negative.    He has had some nausea, but no vomiting.   He has not noted any numbness or specific weakness in his body   Most recent A1c was 9.2 in August.  FBG was 99 this am.    He was told that he has a TIA about 10 years ago per his report.  He takes plavix, no aspirin No CP or SOB.   "Just weak, just tired.  I stay tired, just don't want to do nothing."    He also needs his testosterone shot today.  His last shot was 10/14- he is overdue for his bi- weekly dose.  Admits that "this is probably all stress."  He is in the middle of a settlement for a WC case with a lot of money at stake.   Patient Active Problem List   Diagnosis Date Noted  . Essential hypertension, benign 12/14/2012  . Pancreatitis, acute 06/02/2012  . Testosterone deficiency 10/22/2011  . Headache above the eye region 02/22/2011  . Type II or unspecified  type diabetes mellitus with unspecified complication, uncontrolled 02/22/2011  . TIA on medication 02/22/2011  . Coronary artery disease 02/22/2011    Past Medical History  Diagnosis Date  . HTN (hypertension)   . Diabetes mellitus   . Hypercholesteremia   . TIA (transient ischemic attack)   . Iron deficiency   . Arthritis   . Stroke     Past Surgical History  Procedure Laterality Date  . Back surgery  1985  . Neck surgery  1985  . Temporal artery biopsy / ligation  02/26/2011    left side  . Artery biopsy  02/26/2011    Procedure: BIOPSY TEMPORAL ARTERY;  Surgeon: Iona Coach, MD;  Location: [facility];  Service: General;  Laterality: Left;  Left temporal artery biospy    History  Substance Use Topics  . Smoking status: Former Smoker -- 35 years    Types: Cigarettes    Quit date: 04/09/1990  . Smokeless tobacco: Never Used  . Alcohol Use: No     Comment: "per pt stopped drinking in the 90's"    Family History  Problem Relation Age  of Onset  . Heart disease Mother     No Known Allergies  Medication list has been reviewed and updated.  Current Outpatient Prescriptions on File Prior to Visit  Medication Sig Dispense Refill  . atorvastatin (LIPITOR) 40 MG tablet Take 1 tablet (40 mg total) by mouth daily.  90 tablet  1  . clopidogrel (PLAVIX) 75 MG tablet Take 1 tablet (75 mg total) by mouth daily.  30 tablet  10  . fish oil-omega-3 fatty acids 1000 MG capsule Take 2 g by mouth daily.        Marland Kitchen glucose blood (EASY TOUCH TEST) test strip Use to test blood sugar 3 times daily. Dx code: 250.02  100 each  11  . insulin aspart (NOVOLOG FLEXPEN) 100 UNIT/ML SOPN FlexPen Inject 15 Units into the skin 3 (three) times daily with meals.  5 pen  2  . insulin detemir (LEVEMIR) 100 UNIT/ML injection Inject 0.5 mLs (50 Units total) into the skin 2 (two) times daily.  10 mL  11  . Insulin Pen Needle 32G X 4 MM MISC Use as directed to inject insulin tidwc  100  each  11  . l-methylfolate-B6-B12 (METANX) 3-35-2 MG TABS Take 1 tablet by mouth daily.        . Lancets MISC To check blood glucose tid  100 each  5  . lipase/protease/amylase (CREON-12/PANCREASE) 12000 UNITS CPEP Take 2 capsules by mouth 3 (three) times daily with meals.  540 capsule  1  . meclizine (ANTIVERT) 12.5 MG tablet Take one pill every 8 hours only if needed for dizziness  20 tablet  0  . metFORMIN (GLUCOPHAGE) 1000 MG tablet Take 1 tablet (1,000 mg total) by mouth 2 (two) times daily with a meal.  180 tablet  3  . Pancrelipase, Lip-Prot-Amyl, 5000 UNITS CPEP Take 1 capsule (5,000 Units total) by mouth daily.  30 capsule  11  . traMADol (ULTRAM) 50 MG tablet Take 1 tablet (50 mg total) by mouth every 8 (eight) hours as needed for pain.  15 tablet  0  . Vitamin D, Ergocalciferol, (DRISDOL) 50000 UNITS CAPS Take 50,000 Units by mouth.        . ADVAIR DISKUS 250-50 MCG/DOSE AEPB INHALE ONE DOSE BY MOUTH INTO THE LUNGS TWICE DAILY  60 each  3  . Fluticasone-Salmeterol (ADVAIR) 250-50 MCG/DOSE AEPB Inhale 1 puff into the lungs as needed.       Marland Kitchen HYDROcodone-homatropine (HYCODAN) 5-1.5 MG/5ML syrup Take 5 mLs by mouth every 8 (eight) hours as needed for cough.  120 mL  1  . lisinopril-hydrochlorothiazide (PRINZIDE,ZESTORETIC) 10-12.5 MG per tablet Take 1 tablet by mouth daily.  90 tablet  3  . Multiple Vitamins-Minerals (MULTIVITAMIN WITH MINERALS) tablet Take 1 tablet by mouth daily.        Marland Kitchen omeprazole (PRILOSEC) 20 MG capsule TAKE ONE CAPSULE BY MOUTH EVERY DAY  90 capsule  1  . Pancrelipase, Lip-Prot-Amyl, 10500 UNITS CPEP Take 2 capsules (21,000 Units total) by mouth 3 x daily with food.  540 capsule  6  . sucralfate (CARAFATE) 1 G tablet Take 1 tablet (1 g total) by mouth 4 (four) times daily.  40 tablet  0  . [DISCONTINUED] testosterone cypionate (DEPOTESTOTERONE CYPIONATE) 200 MG/ML injection Inject into the muscle every 14 (fourteen) days.       Current Facility-Administered  Medications on File Prior to Visit  Medication Dose Route Frequency Provider Last Rate Last Dose  . testosterone cypionate (DEPOTESTOTERONE CYPIONATE) injection 200  mg  200 mg Intramuscular Q14 Days Elvina Sidle, MD   200 mg at 01/20/13 1610    Review of Systems:  As per HPI- otherwise negative.   Physical Examination: Filed Vitals:   02/19/13 0856  BP: 126/78  Pulse: 99  Temp: 98.2 F (36.8 C)  Resp: 18   Filed Vitals:   02/19/13 0856  Height: 5' 8.5" (1.74 m)  Weight: 199 lb (90.266 kg)   Body mass index is 29.81 kg/(m^2). Ideal Body Weight: Weight in (lb) to have BMI = 25: 166.5  GEN: WDWN, NAD, Non-toxic, A & O x 3 HEENT: Atraumatic, Normocephalic. Neck supple. No masses, No LAD.  Bilateral TM wnl, oropharynx normal.  PEERL,EOMI.  No tenderness or nodules over left temporal artery noted Ears and Nose: No external deformity. CV: RRR, No M/G/R. No JVD. No thrill. No extra heart sounds. PULM: CTA B, no wheezes, crackles, rhonchi. No retractions. No resp. distress. No accessory muscle use. ABD: S, NT, ND, +BS. No rebound. No HSM. EXTR: No c/c/e NEURO Normal gait for pt- kyphosis but no unsteadyness.   Normal strength, sensation and DTR all extremities.   PSYCH: Normally interactive. Conversant. Not depressed or anxious appearing.  Calm demeanor.   Results for orders placed in visit on 02/19/13  POCT CBC      Result Value Range   WBC 9.0  4.6 - 10.2 K/uL   Lymph, poc 2.7  0.6 - 3.4   POC LYMPH PERCENT 30.5  10 - 50 %L   MID (cbc) 0.7  0 - 0.9   POC MID % 8.3  0 - 12 %M   POC Granulocyte 5.5  2 - 6.9   Granulocyte percent 61.2  37 - 80 %G   RBC 4.93  4.69 - 6.13 M/uL   Hemoglobin 13.5 (*) 14.1 - 18.1 g/dL   HCT, POC 96.0 (*) 45.4 - 53.7 %   MCV 87.6  80 - 97 fL   MCH, POC 27.4  27 - 31.2 pg   MCHC 31.3 (*) 31.8 - 35.4 g/dL   RDW, POC 09.8     Platelet Count, POC 307  142 - 424 K/uL   MPV 10.2  0 - 99.8 fL  POCT GLYCOSYLATED HEMOGLOBIN (HGB A1C)      Result  Value Range   Hemoglobin A1C 8.2      Assessment and Plan: Left temporal headache - Plan: C-reactive protein, POCT SEDIMENTATION RATE  Diabetes mellitus, insulin dependent (IDDM), uncontrolled - Plan: POCT CBC, POCT glycosylated hemoglobin (Hb A1C), Comprehensive metabolic panel  Hyponatremia - Plan: Comprehensive metabolic panel  Dizziness and giddiness  Hypogonadism male - Plan: testosterone cypionate (DEPOTESTOTERONE CYPIONATE) injection 200 mg  Jaryd is here today with a few different issues.  We had a scare regarding temporal arteritis in 2012 and he actually had a temporal artery bx; this was negative.  Today's ESR is not as high as it was then.  However TA is something to consider.  [facility] was kind enough to work him in for tomorrow.  [facility] does not have anyone in the office who can see him tomorrow.  Appt with Graot tomorrow at 9:45  DM is under adequate control, A1c is improved  Vague dizziness and fatigue. Discussed imaging of his head; at this time he does not wish to do this, but will let me know if he worsens in any way  Signed Abbe Amsterdam, MD  11/15: called to check on Sheriff.  He reports he  is feeling better, and that his visit with Dr. Dione Booze went well.  He states he is following up there next week.  I will send him a copy of his labs, and asked him to alert me if he has any other problems

## 2013-02-19 NOTE — Patient Instructions (Signed)
Let us know if you have any problems or changes in your symptoms.

## 2013-02-21 ENCOUNTER — Encounter: Payer: Self-pay | Admitting: Family Medicine

## 2013-02-26 ENCOUNTER — Telehealth: Payer: Self-pay

## 2013-02-26 ENCOUNTER — Ambulatory Visit (INDEPENDENT_AMBULATORY_CARE_PROVIDER_SITE_OTHER): Payer: Medicare Other | Admitting: Internal Medicine

## 2013-02-26 ENCOUNTER — Encounter: Payer: Self-pay | Admitting: Internal Medicine

## 2013-02-26 VITALS — BP 130/72 | HR 96 | Ht 67.75 in | Wt 204.4 lb

## 2013-02-26 DIAGNOSIS — Z7189 Other specified counseling: Secondary | ICD-10-CM | POA: Insufficient documentation

## 2013-02-26 DIAGNOSIS — K861 Other chronic pancreatitis: Secondary | ICD-10-CM | POA: Insufficient documentation

## 2013-02-26 DIAGNOSIS — D638 Anemia in other chronic diseases classified elsewhere: Secondary | ICD-10-CM | POA: Insufficient documentation

## 2013-02-26 DIAGNOSIS — Z1211 Encounter for screening for malignant neoplasm of colon: Secondary | ICD-10-CM

## 2013-02-26 MED ORDER — SOD PICOSULFATE-MAG OX-CIT ACD 10-3.5-12 MG-GM-GM PO PACK: PACK | ORAL | Status: DC

## 2013-02-26 NOTE — Assessment & Plan Note (Signed)
Continue pancreatic enzyme supplements, followup as needed

## 2013-02-26 NOTE — Progress Notes (Signed)
Subjective:    Patient ID: Justin [name], male    DOB: 1934/11/23, 78 y.o.   MRN: [id number]  HPI This is a very nice man here with his wife. He was found to have an anemia on recent visit to his primary care and this referred for consideration of GI evaluation. He is having EGD and a colonoscopy in 2004, that showed some esophagitis and diverticulosis respectively but no other abnormalities. More recently he was hospitalized in [facility] with acute pancreatitis, and had an evaluation at [facility] with an endoscopic ultrasound that showed chronic pancreatitis. He denies any bowel habit changes, abdominal pain, rectal bleeding or melena. He was Hemoccult-negative on immune fecal occult blood testing recently. His hemoglobin has been in the 13 range for several years it looks like to me from review of the records. No Known Allergies Outpatient Prescriptions Prior to Visit  Medication Sig Dispense Refill  . atorvastatin (LIPITOR) 40 MG tablet Take 1 tablet (40 mg total) by mouth daily.  90 tablet  1  . clopidogrel (PLAVIX) 75 MG tablet Take 1 tablet (75 mg total) by mouth daily.  30 tablet  10  . fish oil-omega-3 fatty acids 1000 MG capsule Take 2 g by mouth daily.        Marland Kitchen glucose blood (EASY TOUCH TEST) test strip Use to test blood sugar 3 times daily. Dx code: 250.02  100 each  11  . insulin aspart (NOVOLOG FLEXPEN) 100 UNIT/ML SOPN FlexPen Inject 15 Units into the skin 3 (three) times daily with meals.  5 pen  2  . insulin detemir (LEVEMIR) 100 UNIT/ML injection Inject 0.5 mLs (50 Units total) into the skin 2 (two) times daily.  10 mL  11  . Insulin Pen Needle 32G X 4 MM MISC Use as directed to inject insulin tidwc  100 each  11  . l-methylfolate-B6-B12 (METANX) 3-35-2 MG TABS Take 1 tablet by mouth daily.        . Lancets MISC To check blood glucose tid  100 each  5  . meclizine (ANTIVERT) 12.5 MG tablet Take one pill every 8 hours only if needed for dizziness  20 tablet  0  .  metFORMIN (GLUCOPHAGE) 1000 MG tablet Take 1 tablet (1,000 mg total) by mouth 2 (two) times daily with a meal.  180 tablet  3  . Multiple Vitamins-Minerals (MULTIVITAMIN WITH MINERALS) tablet Take 1 tablet by mouth daily.        Marland Kitchen omeprazole (PRILOSEC) 20 MG capsule TAKE ONE CAPSULE BY MOUTH EVERY DAY  90 capsule  1  . lipase/protease/amylase (CREON-12/PANCREASE) 12000 UNITS CPEP Take 2 capsules by mouth 3 (three) times daily with meals.  540 capsule  1  . Pancrelipase, Lip-Prot-Amyl, 10500 UNITS CPEP Take 2 capsules (21,000 Units total) by mouth 3 x daily with food.  540 capsule  6  . Pancrelipase, Lip-Prot-Amyl, 5000 UNITS CPEP Take 1 capsule (5,000 Units total) by mouth daily.  30 capsule  11  . Hydrocodone-Acetaminophen 10-300 MG TABS Take 1 tablet by mouth every 6 (six) hours as needed.      Marland Kitchen lisinopril-hydrochlorothiazide (PRINZIDE,ZESTORETIC) 10-12.5 MG per tablet Take 1 tablet by mouth daily.  90 tablet  3  . sucralfate (CARAFATE) 1 G tablet Take 1 tablet (1 g total) by mouth 4 (four) times daily.  40 tablet  0  . traMADol (ULTRAM) 50 MG tablet Take 1 tablet (50 mg total) by mouth every 8 (eight) hours as needed for pain.  15 tablet  0  . triamterene-hydrochlorothiazide (MAXZIDE-25) 37.5-25 MG per tablet Take 1 tablet by mouth daily.      . Vitamin D, Ergocalciferol, (DRISDOL) 50000 UNITS CAPS Take 50,000 Units by mouth.         Facility-Administered Medications Prior to Visit  Medication Dose Route Frequency Provider Last Rate Last Dose  . testosterone cypionate (DEPOTESTOTERONE CYPIONATE) injection 200 mg  200 mg Intramuscular Q14 Days Elvina Sidle, MD   200 mg at 01/20/13 1610   Past Medical History  Diagnosis Date  . HTN (hypertension)   . Diabetes mellitus   . Hypercholesteremia   . TIA (transient ischemic attack)   . Iron deficiency   . Arthritis   . Stroke    Past Surgical History  Procedure Laterality Date  . Back surgery  1985  . Neck surgery  1985  . Temporal  artery biopsy / ligation  02/26/2011    left side  . Artery biopsy  02/26/2011    Procedure: BIOPSY TEMPORAL ARTERY;  Surgeon: Iona Coach, MD;  Location: [facility];  Service: General;  Laterality: Left;  Left temporal artery biospy  . Cholecystectomy    . Colonoscopy    . Upper gastrointestinal endoscopy    . Eus     History   Social History  . Marital Status: Married    Spouse Name: N/A    Number of Children: 6  . Years of Education: N/A   Social History Main Topics  . Smoking status: Former Smoker -- 35 years    Types: Cigarettes    Quit date: 04/09/1990  . Smokeless tobacco: Never Used  . Alcohol Use: No     Comment: "per pt stopped drinking in the 90's"  . Drug Use: No  . Sexual Activity: None   Other Topics Concern  . None   Social History Narrative  . None   Family History  Problem Relation Age of Onset  . Heart disease Mother   . Diabetes Sister     x 2  . Heart attack Sister    Review of Systems Some fatigue, he said some dizzy spells, there was concern about recurrent temporal or iritis but apparently not the case. All other review of systems negative.    Objective:   Physical Exam General:  Well-developed, well-nourished and in no acute distress Eyes:  anicteric. ENT:   Mouth and posterior pharynx free of lesions.  Neck:   supple w/o thyromegaly or mass.  Lungs: Clear to auscultation bilaterally. Heart:  S1S2, no rubs, murmurs, gallops. Abdomen:  soft, non-tender, no hepatosplenomegaly, hernia, or mass and BS+.  Rectal: deferred Lymph:  no cervical or supraclavicular adenopathy. Extremities:   no edema Neuro:  A&O x 3.  Psych:  appropriate mood and  Affect.   Data Reviewed: 2004 EGD - esophagitis 2004 colonoscopy - diverticulosis 2014 EUS - chronic pancreatitis    Assessment & Plan:   1. Anemia of chronic disease   2. Chronic pancreatitis   3. Special screening for malignant neoplasms, colon

## 2013-02-26 NOTE — Assessment & Plan Note (Signed)
Since his anemia is long-standing and consistent with chronic disease, he falls into the range of need for screening colonoscopy since it's been 10 years. We discussed the possibility of using the Cologuard fecal DNA and occult blood test, but we both decided to pursue a colonoscopy.The risks and benefits as well as alternatives of endoscopic procedure(s) have been discussed and reviewed. All questions answered. The patient agrees to proceed. He knows that he could have a complication by holding all pedicle but it would be best to hold that prior to the procedure to reduce risk of bleeding from any major polypectomy. He takes clopidogrel because of a history of TIA.

## 2013-02-26 NOTE — Patient Instructions (Addendum)
You have been scheduled for a colonoscopy with propofol. Please follow written instructions given to you at your visit today.  Please pick up your prep kit at the pharmacy within the next 1-3 days. If you use inhalers (even only as needed), please bring them with you on the day of your procedure.  I appreciate the opportunity to care for you.   

## 2013-02-26 NOTE — Telephone Encounter (Signed)
  02/26/2013   RE: CALLAGHAN LAVERDURE DOB: Aug 30, 1934 MRN: [id number]   Dear Abbe Amsterdam M.D,,    We have scheduled the above patient for an endoscopic procedure. Our records show that he is on anticoagulation therapy.   Please advise as to how long the patient may come off his therapy of Plavix prior to the colonoscopy procedure, which is scheduled for 04/15/2013.  Please fax back/ or route the completed form to Vickie Ponds [place], CMA (AAMA) at [phone].   Sincerely,    Dr. Stan Head

## 2013-02-26 NOTE — Assessment & Plan Note (Signed)
Normocytic long-standing mild anemia consistent with that. Recent B12 and iron testing show normal B12 and ferritin levels.  Lab Results  Component Value Date   FERRITIN 76 09/09/2012   . Lab Results  Component Value Date   VITAMINB12 358 12/14/2012

## 2013-02-27 ENCOUNTER — Telehealth: Payer: Self-pay

## 2013-02-27 NOTE — Telephone Encounter (Signed)
I have spoken to patient and told him I will try and obtain a prepopik sample for him prior to his 04/15/13 colonoscopy since insurance requiring a prior authorization. I also contacted [facility] and told them to cancel the rx.

## 2013-03-10 NOTE — Telephone Encounter (Signed)
Pt has records at [facility]- need to request these

## 2013-03-11 ENCOUNTER — Encounter: Payer: Self-pay | Admitting: Family Medicine

## 2013-03-13 ENCOUNTER — Telehealth: Payer: Self-pay

## 2013-03-13 NOTE — Telephone Encounter (Signed)
Spoke with patient and told him that I put a prepopik sample up front for pick up prior to his 04/15/13 colonoscopy appointment.  He will come by and pick it up.

## 2013-03-23 ENCOUNTER — Other Ambulatory Visit: Payer: Self-pay | Admitting: Family Medicine

## 2013-04-01 ENCOUNTER — Telehealth: Payer: Self-pay

## 2013-04-01 NOTE — Telephone Encounter (Signed)
Will route this information to Dr Leone Payor to give him time to contact patient prior to colonoscopy.

## 2013-04-01 NOTE — Telephone Encounter (Signed)
Message copied by [place], Autumne Kallio E on Wed Apr 01, 2013  9:36 AM ------      Message from: Pearline Cables      Created: Tue Mar 31, 2013  5:06 PM       Hi Emry Tobin- Dr. Leone Payor replied to me, I think he plans to discuss with Sherilyn Cooter and hold Plavix assuming that Mr. Rennaker is comfortable with the risk. I was unfortunately not able to find any helpful information regarding his past CVA as his neurologist from that time has moved away. Let me know if I can do anything else to help.       JC      ----- Message -----         From: Kathren Scearce E [place], CMA         Sent: 03/31/2013   9:22 AM           To: Pearline Cables, MD            Hello just touching base to see if you have gotten the information you need to decide if patient can hold his plavix for his colonoscopy upcoming 04/15/2013.  Thank you for your help with this matter.        ------

## 2013-04-01 NOTE — Telephone Encounter (Signed)
-----   Message -----    From: Pearline Cables, MD    Sent: 03/03/2013   4:47 PM      To: Patti E [place], CMA, Iva Boop, MD  Hi folks- I did not want you to think I had forgotten about Justin [name]'s plavix use.  Apparently he had a TIA or CVA in 2005; we did not start to see him until 2012 at which point he was already on plavix.  I do not know much about the circumstances surrounding his event in 2005; I am trying to find some more information about this and will get back with you as soon as I know enough to make a recommendation about if he can discontinue this medication JC

## 2013-04-05 ENCOUNTER — Ambulatory Visit (INDEPENDENT_AMBULATORY_CARE_PROVIDER_SITE_OTHER): Payer: Medicare Other | Admitting: Radiology

## 2013-04-05 DIAGNOSIS — E291 Testicular hypofunction: Secondary | ICD-10-CM

## 2013-04-05 MED ORDER — TESTOSTERONE CYPIONATE 100 MG/ML IM SOLN
200.0000 mg | INTRAMUSCULAR | Status: DC
  Administered 2013-04-05: 200 mg via INTRAMUSCULAR

## 2013-04-06 ENCOUNTER — Telehealth: Payer: Self-pay

## 2013-04-06 NOTE — Telephone Encounter (Signed)
Called him- he let me know that all of his questions were answered and he was ok.

## 2013-04-06 NOTE — Telephone Encounter (Signed)
Patient was ok with holding Plavix 5 days prior to colonoscopy and understood last dose will be January 1st .  He wants me to call him and remind him to prep.  I will do this.

## 2013-04-06 NOTE — Telephone Encounter (Signed)
Patient is to have a procedure done on January 7. He is wanting to ask you a couple questions.

## 2013-04-06 NOTE — Telephone Encounter (Signed)
Spoke to patient and questions answered regarding if he had to pay anything up front for his colonoscopy, No per Darcey Nora, [facility]

## 2013-04-06 NOTE — Telephone Encounter (Signed)
Message copied by [place], Nimco Bivens E on Mon Apr 06, 2013  2:56 PM ------      Message from: Stan Head E      Created: Mon Apr 06, 2013  9:15 AM      Regarding: holding clopidogrel       I had not told you yet but as best we (Dr. Patsy Lager and I ) can tell it seems reasonable to hold clopidogrel 5 days before his colonoscopy.            Please let him know that is what I think and recommend (hold) - there is a very small chance something like a stroke could happen off clopidogrel but in cases like his that is very rare - could do the colonoscopy in clopidogrel but that increases bleeding risk.      Will do it either way - call him and let him know and then proceed. I can call him personally if he wants.             ------

## 2013-04-10 ENCOUNTER — Telehealth: Payer: Self-pay

## 2013-04-10 NOTE — Telephone Encounter (Signed)
Spoke to patient and reminded him to hold his generic Plavix after today for upcoming colonoscopy.  Patient had requested that I call and remind him of this.

## 2013-04-13 ENCOUNTER — Telehealth: Payer: Self-pay

## 2013-04-13 NOTE — Telephone Encounter (Signed)
Per patient request left message to prep for his colonoscopy that's 04/15/13.  So starting tomorrow do the clear liquid diet all day.

## 2013-04-15 ENCOUNTER — Ambulatory Visit (AMBULATORY_SURGERY_CENTER): Payer: Medicare Other | Admitting: Internal Medicine

## 2013-04-15 ENCOUNTER — Encounter: Payer: Self-pay | Admitting: Internal Medicine

## 2013-04-15 VITALS — BP 133/63 | HR 60 | Temp 97.8°F | Resp 16 | Ht 67.0 in | Wt 204.0 lb

## 2013-04-15 DIAGNOSIS — Z860100 Personal history of colon polyps, unspecified: Secondary | ICD-10-CM | POA: Insufficient documentation

## 2013-04-15 DIAGNOSIS — D126 Benign neoplasm of colon, unspecified: Secondary | ICD-10-CM

## 2013-04-15 DIAGNOSIS — Z1211 Encounter for screening for malignant neoplasm of colon: Secondary | ICD-10-CM

## 2013-04-15 HISTORY — DX: Personal history of colonic polyps: Z86.010

## 2013-04-15 HISTORY — DX: Personal history of colon polyps, unspecified: Z86.0100

## 2013-04-15 MED ORDER — SODIUM CHLORIDE 0.9 % IV SOLN: 500.0000 mL | INTRAVENOUS | Status: DC

## 2013-04-15 MED ORDER — DEXTROSE 5 % IV SOLN: INTRAVENOUS | Status: DC

## 2013-04-15 NOTE — Op Note (Signed)
[facility]  [address]. [place], [id number]   COLONOSCOPY PROCEDURE REPORT  PATIENT: Justin [name], Justin [name]  MR#: [id number] BIRTHDATE: 1934-09-19 , 78  yrs. old GENDER: Male ENDOSCOPIST: Gatha Mayer, MD, [facility] REFERRED RD:EYCXKGY Copland, M.D. PROCEDURE DATE:  04/15/2013 PROCEDURE:   Colonoscopy with biopsy First Screening Colonoscopy - Avg.  risk and is 50 yrs.  old or older - No.  Prior Negative Screening - Now for repeat screening. 10 or more years since last screening  History of Adenoma - Now for follow-up colonoscopy & has been > or = to 3 yrs.  N/A  Polyps Removed Today? Yes. ASA CLASS:   Class III INDICATIONS:average risk screening and Last colonoscopy performed 10 years ago. MEDICATIONS: propofol (Diprivan) 200mg  IV, MAC sedation, administered by CRNA, and These medications were titrated to patient response per physician's verbal order  DESCRIPTION OF PROCEDURE:   After the risks benefits and alternatives of the procedure were thoroughly explained, informed consent was obtained.  A digital rectal exam revealed no abnormalities of the rectum, A digital rectal exam revealed no prostatic nodules, and A digital rectal exam revealed the prostate was not enlarged.   The [id number]  endoscope was introduced through the anus and advanced to the cecum, which was identified by both the appendix and ileocecal valve. No adverse events experienced.   The quality of the prep was Suprep good  The instrument was then slowly withdrawn as the colon was fully examined.  COLON FINDINGS: Two diminutive sessile polyps were found in the ascending colon.  A polypectomy was performed with cold forceps. The resection was complete and the polyp tissue was completely retrieved.   The colon mucosa was otherwise normal.  Retroflexed views revealed no abnormalities. The time to cecum=11 minutes 58 seconds.  Withdrawal time=14 minutes 25 seconds.  The scope was withdrawn  and the procedure completed. COMPLICATIONS: There were no complications.  ENDOSCOPIC IMPRESSION: 1.   Two diminutive sessile polyps were found in the ascending colon; polypectomy was performed with cold forceps 2.   The colon mucosa was otherwise normal - good prep  RECOMMENDATIONS: Await pathology results- do not anticipate he will need furthyer routine colonoscopy Restart clopidogrel.   eSigned:  Gatha Mayer, MD, [facility] 04/15/2013 1:55 PM   cc: Lamar Blinks, MD and The Patient

## 2013-04-15 NOTE — Patient Instructions (Addendum)
I found and removed 2 tiny polyps that look benign. You should not need any more routine colonoscopy.  I will let you know pathology results and when to have another routine colonoscopy by mail.  Please restart all of your medicines including clopidogrel (Plavix).  I appreciate the opportunity to care for you. Gatha Mayer, MD, [facility]  Polyp information sheet given.  YOU HAD AN ENDOSCOPIC PROCEDURE TODAY AT [facility]: Refer to the procedure report that was given to you for any specific questions about what was found during the examination.  If the procedure report does not answer your questions, please call your gastroenterologist to clarify.  If you requested that your care partner not be given the details of your procedure findings, then the procedure report has been included in a sealed envelope for you to review at your convenience later.  YOU SHOULD EXPECT: Some feelings of bloating in the abdomen. Passage of more gas than usual.  Walking can help get rid of the air that was put into your GI tract during the procedure and reduce the bloating. If you had a lower endoscopy (such as a colonoscopy or flexible sigmoidoscopy) you may notice spotting of blood in your stool or on the toilet paper. If you underwent a bowel prep for your procedure, then you may not have a normal bowel movement for a few days.  DIET: Your first meal following the procedure should be a light meal and then it is ok to progress to your normal diet.  A half-sandwich or bowl of soup is an example of a good first meal.  Heavy or fried foods are harder to digest and may make you feel nauseous or bloated.  Likewise meals heavy in dairy and vegetables can cause extra gas to form and this can also increase the bloating.  Drink plenty of fluids but you should avoid alcoholic beverages for 24 hours.  ACTIVITY: Your care partner should take you home directly after the procedure.  You should plan to take it easy,  moving slowly for the rest of the day.  You can resume normal activity the day after the procedure however you should NOT DRIVE or use heavy machinery for 24 hours (because of the sedation medicines used during the test).    SYMPTOMS TO REPORT IMMEDIATELY: A gastroenterologist can be reached at any hour.  During normal business hours, 8:30 AM to 5:00 PM Monday through Friday, call [phone].  After hours and on weekends, please call the GI answering service at [phone] who will take a message and have the physician on call contact you.   Following lower endoscopy (colonoscopy or flexible sigmoidoscopy):  Excessive amounts of blood in the stool  Significant tenderness or worsening of abdominal pains  Swelling of the abdomen that is new, acute  Fever of 100F or higher  FOLLOW UP: If any biopsies were taken you will be contacted by phone or by letter within the next 1-3 weeks.  Call your gastroenterologist if you have not heard about the biopsies in 3 weeks.  Our staff will call the home number listed on your records the next business day following your procedure to check on you and address any questions or concerns that you may have at that time regarding the information given to you following your procedure. This is a courtesy call and so if there is no answer at the home number and we have not heard from you through the emergency physician on  call, we will assume that you have returned to your regular daily activities without incident.  SIGNATURES/CONFIDENTIALITY: You and/or your care partner have signed paperwork which will be entered into your electronic medical record.  These signatures attest to the fact that that the information above on your After Visit Summary has been reviewed and is understood.  Full responsibility of the confidentiality of this discharge information lies with you and/or your care-partner.

## 2013-04-15 NOTE — Progress Notes (Signed)
A/ox3 pleased with MAC, report to Karol RN 

## 2013-04-15 NOTE — Progress Notes (Signed)
Called to room to assist during endoscopic procedure.  Patient ID and intended procedure confirmed with present staff. Received instructions for my participation in the procedure from the performing physician. [email or web address] 

## 2013-04-16 ENCOUNTER — Telehealth: Payer: Self-pay | Admitting: *Deleted

## 2013-04-16 NOTE — Telephone Encounter (Signed)
  Follow up Call-  Call back number 04/15/2013  Post procedure Call Back phone  # [phone] or [phone] cell  Permission to leave phone message Yes     Patient questions:  Do you have a fever, pain , or abdominal swelling? no Pain Score  0 *  Have you tolerated food without any problems? yes  Have you been able to return to your normal activities? yes  Do you have any questions about your discharge instructions: Diet   no Medications  no Follow up visit  no  Do you have questions or concerns about your Care? no  Actions: * If pain score is 4 or above: No action needed, pain <4.  Stated I feel great.

## 2013-04-20 ENCOUNTER — Encounter: Payer: Self-pay | Admitting: Internal Medicine

## 2013-05-08 ENCOUNTER — Ambulatory Visit (INDEPENDENT_AMBULATORY_CARE_PROVIDER_SITE_OTHER): Payer: Medicare Other | Admitting: Family Medicine

## 2013-05-08 VITALS — BP 132/76 | HR 77 | Temp 98.0°F | Resp 18 | Ht 69.0 in | Wt 203.6 lb

## 2013-05-08 DIAGNOSIS — J329 Chronic sinusitis, unspecified: Secondary | ICD-10-CM

## 2013-05-08 DIAGNOSIS — E1165 Type 2 diabetes mellitus with hyperglycemia: Secondary | ICD-10-CM

## 2013-05-08 DIAGNOSIS — R609 Edema, unspecified: Secondary | ICD-10-CM

## 2013-05-08 DIAGNOSIS — IMO0001 Reserved for inherently not codable concepts without codable children: Secondary | ICD-10-CM

## 2013-05-08 LAB — BASIC METABOLIC PANEL
BUN: 10 mg/dL (ref 6–23)
CALCIUM: 9 mg/dL (ref 8.4–10.5)
CO2: 29 meq/L (ref 19–32)
Chloride: 100 mEq/L (ref 96–112)
Creat: 1.04 mg/dL (ref 0.50–1.35)
GLUCOSE: 71 mg/dL (ref 70–99)
Potassium: 4.5 mEq/L (ref 3.5–5.3)
SODIUM: 135 meq/L (ref 135–145)

## 2013-05-08 LAB — POCT GLYCOSYLATED HEMOGLOBIN (HGB A1C): HEMOGLOBIN A1C: 9

## 2013-05-08 LAB — POCT CBC
Granulocyte percent: 52 %G (ref 37–80)
HCT, POC: 40.6 % — AB (ref 43.5–53.7)
HEMOGLOBIN: 12.5 g/dL — AB (ref 14.1–18.1)
Lymph, poc: 3.7 — AB (ref 0.6–3.4)
MCH: 27.4 pg (ref 27–31.2)
MCHC: 30.8 g/dL — AB (ref 31.8–35.4)
MCV: 88.8 fL (ref 80–97)
MID (cbc): 0.8 (ref 0–0.9)
MPV: 10.2 fL (ref 0–99.8)
POC GRANULOCYTE: 4.8 (ref 2–6.9)
POC LYMPH %: 39.7 % (ref 10–50)
POC MID %: 8.3 %M (ref 0–12)
Platelet Count, POC: 471 10*3/uL — AB (ref 142–424)
RBC: 4.57 M/uL — AB (ref 4.69–6.13)
RDW, POC: 17.9 %
WBC: 9.3 10*3/uL (ref 4.6–10.2)

## 2013-05-08 MED ORDER — CEFDINIR 300 MG PO CAPS: 600.0000 mg | ORAL_CAPSULE | Freq: Every day | ORAL | Status: DC

## 2013-05-08 MED ORDER — FLUTICASONE PROPIONATE 50 MCG/ACT NA SUSP: 2.0000 | Freq: Every day | NASAL | Status: DC

## 2013-05-08 MED ORDER — TORSEMIDE 10 MG PO TABS: 10.0000 mg | ORAL_TABLET | Freq: Every day | ORAL | Status: DC

## 2013-05-08 NOTE — Patient Instructions (Signed)
Take the torsemide 10 mg one daily at about lunchtime for the fluid  Use the nose spray 2 sprays each side of the nose every evening  Take the antibiotic one twice daily  Return if not improving  Your diabetes looks like he is not in as good a control as it used to be. Your hemoglobin A1c is 9.0. It should be less than 7. I urge you to be very careful to eat less sweets, breads, pastas, potatoes, rice, etc.

## 2013-05-08 NOTE — Progress Notes (Signed)
Subjective: Patient is here for couple of things. He has had a congestion for about a month. He says when he bends over he just flows. No fever. Coughing some, not much. He he also been having more problems with ankle edema. By the end of the day cycles are always swollen. The go down overnight. He takes his medicines regularly. He says his sugars run fair.  Objective: Pleasant elderly gentleman in no major distress. TMs normal. Throat clear. Sinuses not acutely tender. Neck supple without significant nodes. Chest is clear to auscultation. Heart regular without murmur. 2+ pitting edema of the ankles.  Assessment: Edema Type 2 diabetes, probably poorly controlled Sinusitis  Plan: Check labs   Results for orders placed in visit on 05/08/13  POCT GLYCOSYLATED HEMOGLOBIN (HGB A1C)      Result Value Range   Hemoglobin A1C 9.0    POCT CBC      Result Value Range   WBC 9.3  4.6 - 10.2 K/uL   Lymph, poc 3.7 (*) 0.6 - 3.4   POC LYMPH PERCENT 39.7  10 - 50 %L   MID (cbc) 0.8  0 - 0.9   POC MID % 8.3  0 - 12 %M   POC Granulocyte 4.8  2 - 6.9   Granulocyte percent 52.0  37 - 80 %G   RBC 4.57 (*) 4.69 - 6.13 M/uL   Hemoglobin 12.5 (*) 14.1 - 18.1 g/dL   HCT, POC 40.6 (*) 43.5 - 53.7 %   MCV 88.8  80 - 97 fL   MCH, POC 27.4  27 - 31.2 pg   MCHC 30.8 (*) 31.8 - 35.4 g/dL   RDW, POC 17.9     Platelet Count, POC 471 (*) 142 - 424 K/uL   MPV 10.2  0 - 99.8 fL   Assessment: Sinusitis Type 2 diabetes for control Edema  Plan:

## 2013-05-18 ENCOUNTER — Ambulatory Visit: Payer: Medicare Other

## 2013-05-18 ENCOUNTER — Ambulatory Visit (INDEPENDENT_AMBULATORY_CARE_PROVIDER_SITE_OTHER): Payer: Medicare Other | Admitting: Family Medicine

## 2013-05-18 VITALS — BP 120/70 | HR 90 | Temp 98.7°F | Resp 18 | Ht 69.0 in | Wt 204.0 lb

## 2013-05-18 DIAGNOSIS — R609 Edema, unspecified: Secondary | ICD-10-CM

## 2013-05-18 DIAGNOSIS — R059 Cough, unspecified: Secondary | ICD-10-CM

## 2013-05-18 DIAGNOSIS — R05 Cough: Secondary | ICD-10-CM

## 2013-05-18 DIAGNOSIS — R0602 Shortness of breath: Secondary | ICD-10-CM

## 2013-05-18 DIAGNOSIS — R498 Other voice and resonance disorders: Secondary | ICD-10-CM

## 2013-05-18 DIAGNOSIS — R067 Sneezing: Secondary | ICD-10-CM

## 2013-05-18 LAB — POCT CBC
Granulocyte percent: 64.1 %G (ref 37–80)
HCT, POC: 39.4 % — AB (ref 43.5–53.7)
Hemoglobin: 12.3 g/dL — AB (ref 14.1–18.1)
LYMPH, POC: 3 (ref 0.6–3.4)
MCH: 28.1 pg (ref 27–31.2)
MCHC: 31.2 g/dL — AB (ref 31.8–35.4)
MCV: 90 fL (ref 80–97)
MID (cbc): 0.9 (ref 0–0.9)
MPV: 10.5 fL (ref 0–99.8)
POC GRANULOCYTE: 7 — AB (ref 2–6.9)
POC LYMPH PERCENT: 27.6 %L (ref 10–50)
POC MID %: 8.3 % (ref 0–12)
Platelet Count, POC: 304 10*3/uL (ref 142–424)
RBC: 4.38 M/uL — AB (ref 4.69–6.13)
RDW, POC: 17.3 %
WBC: 10.9 10*3/uL — AB (ref 4.6–10.2)

## 2013-05-18 LAB — COMPREHENSIVE METABOLIC PANEL
ALK PHOS: 82 U/L (ref 39–117)
ALT: 16 U/L (ref 0–53)
AST: 19 U/L (ref 0–37)
Albumin: 4.1 g/dL (ref 3.5–5.2)
BILIRUBIN TOTAL: 0.8 mg/dL (ref 0.2–1.2)
BUN: 15 mg/dL (ref 6–23)
CO2: 26 mEq/L (ref 19–32)
CREATININE: 1.13 mg/dL (ref 0.50–1.35)
Calcium: 9.1 mg/dL (ref 8.4–10.5)
Chloride: 100 mEq/L (ref 96–112)
Glucose, Bld: 131 mg/dL — ABNORMAL HIGH (ref 70–99)
Potassium: 4.1 mEq/L (ref 3.5–5.3)
SODIUM: 136 meq/L (ref 135–145)
Total Protein: 7.7 g/dL (ref 6.0–8.3)

## 2013-05-18 IMAGING — CR DG CHEST 2V
2 series · 2 of 2 positions shown · non-contrast
Comparison: [DATE].

CLINICAL DATA: Shortness of breath.

EXAM:
CHEST  2 VIEW

[PA]
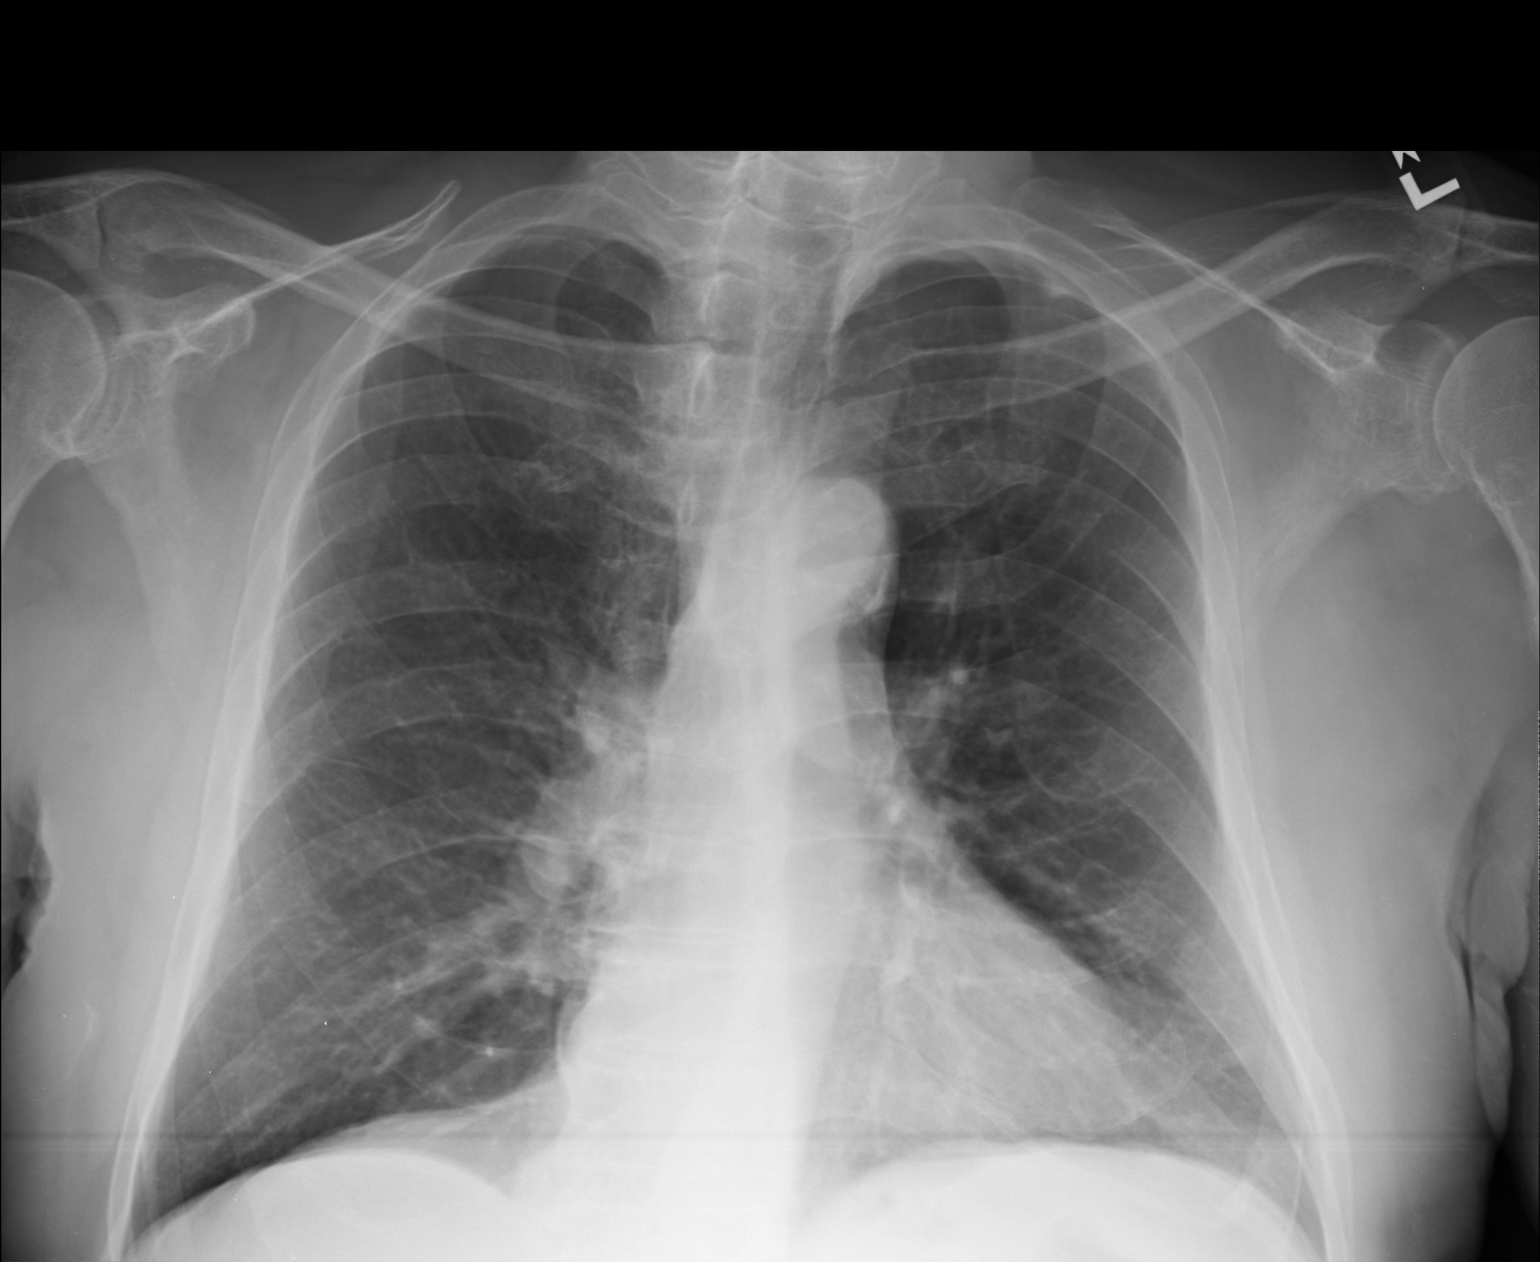

[lateral]
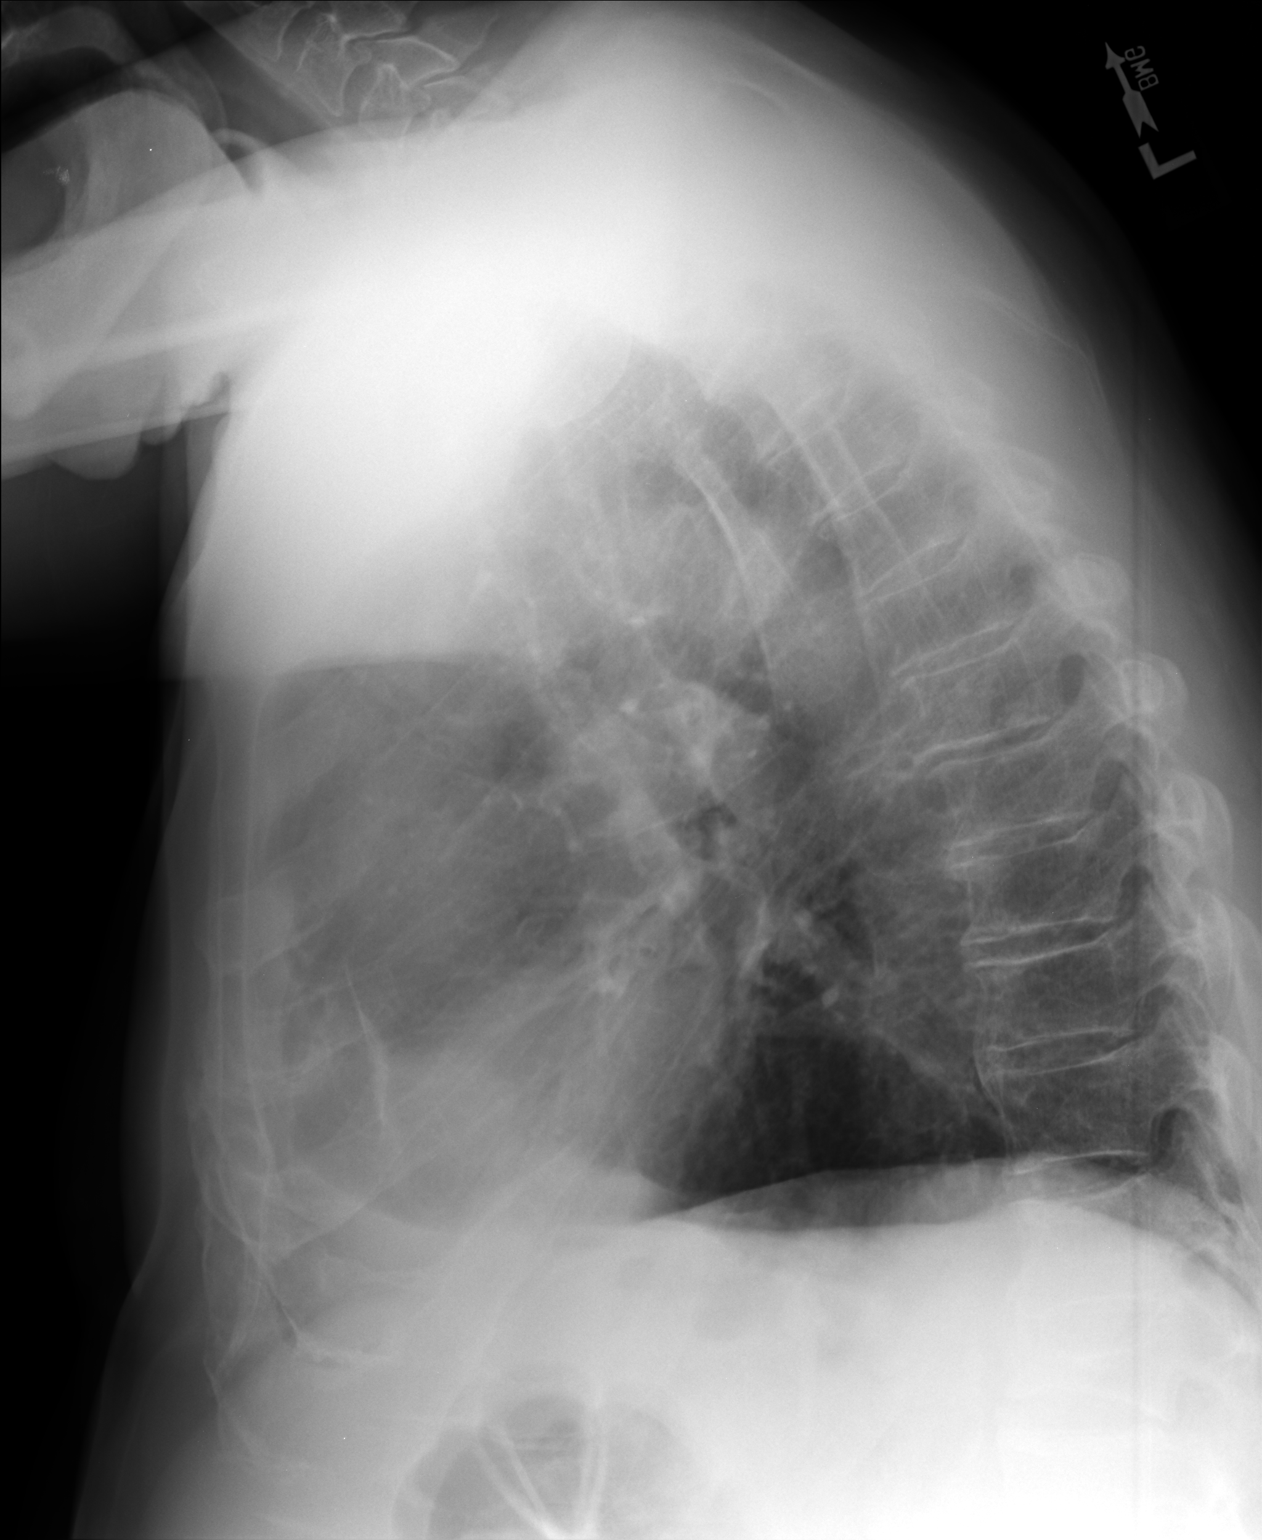

[2 of 2 positions shown; findings below may reference images not displayed]

FINDINGS: The cardiac silhouette, mediastinal and hilar contours are within
normal limits and stable. There is tortuosity and calcification of
the thoracic aorta. Mild chronic scarring changes but no definite
acute overlying pulmonary process. The bony thorax is intact.
IMPRESSION: No acute cardiopulmonary findings. Mild chronic basilar scarring
changes.

## 2013-05-18 MED ORDER — MONTELUKAST SODIUM 10 MG PO TABS: 10.0000 mg | ORAL_TABLET | Freq: Every day | ORAL | Status: DC

## 2013-05-18 MED ORDER — BENZONATATE 100 MG PO CAPS: 100.0000 mg | ORAL_CAPSULE | Freq: Three times a day (TID) | ORAL | Status: DC | PRN

## 2013-05-18 NOTE — Patient Instructions (Signed)
Add the singulair to your regimen, and use the tessalon perles as needed for cough

## 2013-05-18 NOTE — Progress Notes (Signed)
[facility] [address] [place] [address] [phone]  Date:  05/18/2013   Name:  Justin [name]   DOB:  1934-08-09   MRN:  [id number]  PCP:  Lamar Blinks, MD    Chief Complaint: Follow-up   History of Present Illness:  Justin [name] is a 79 y.o. very pleasant male patient who presents with the following:  He is here today with illness.   He was here on 1/30 with chest congestion, and pedal edema.  He is now treated with torsemide which did help with the swelling, and also took a course of omnicef.  The omnicef "did not seem to help at all," he just finished it today.  He continues to cough- the cough is productive at times.  He is not sleeping well due to the cough.   He has not had a fever.   He has a runny nose, some sneezing.  No GI symptoms No chills or aches.   He wonders if he needs anything else for his persistent cough and nasal sx No PND.  No history of CHF  Patient Active Problem List   Diagnosis Date Noted  . Personal history of colonic adenomas 04/15/2013  . Chronic pancreatitis 02/26/2013  . Anemia of chronic disease 02/26/2013  . Special screening for malignant neoplasms, colon 02/26/2013  . Essential hypertension, benign 12/14/2012  . Testosterone deficiency 10/22/2011  . Headache above the eye region 02/22/2011  . Type II or unspecified type diabetes mellitus with unspecified complication, uncontrolled 02/22/2011  . TIA on medication 02/22/2011  . Coronary artery disease 02/22/2011    Past Medical History  Diagnosis Date  . HTN (hypertension)   . Diabetes mellitus   . Hypercholesteremia   . TIA (transient ischemic attack)   . Iron deficiency   . Arthritis   . Stroke   . Pancreatitis, acute 06/02/2012    Diagnosed 05/2012   . Personal history of colonic adenomas 04/15/2013    04/15/2013 2 diminutive polyps      Past Surgical History  Procedure Laterality Date  . Back surgery  1985  . Neck surgery  1985  . Temporal  artery biopsy / ligation  02/26/2011    left side  . Artery biopsy  02/26/2011    Procedure: BIOPSY TEMPORAL ARTERY;  Surgeon: Willey Blade, MD;  Location: [place];  Service: General;  Laterality: Left;  Left temporal artery biospy  . Cholecystectomy    . Colonoscopy    . Upper gastrointestinal endoscopy    . Eus      History  Substance Use Topics  . Smoking status: Former Smoker -- 35 years    Types: Cigarettes    Quit date: 04/09/1990  . Smokeless tobacco: Never Used  . Alcohol Use: No     Comment: "per pt stopped drinking in the 90's"    Family History  Problem Relation Age of Onset  . Heart disease Mother   . Diabetes Sister     x 2  . Heart attack Sister   . Amblyopia Father     No Known Allergies  Medication list has been reviewed and updated.    Review of Systems:  As per HPI- otherwise negative.   Physical Examination: Filed Vitals:   05/18/13 1429  BP: 106/60  Pulse: 90  Temp: 98.7 F (37.1 C)  Resp: 18   Filed Vitals:   05/18/13 1429  Height: 5\' 9"  (1.753 m)  Weight: 204  lb (92.534 kg)   Body mass index is 30.11 kg/(m^2). Ideal Body Weight: Weight in (lb) to have BMI = 25: 168.9  GEN: WDWN, NAD, Non-toxic, A & O x 3, looks well HEENT: Atraumatic, Normocephalic. Neck supple. No masses, No LAD.  Bilateral TM wnl, oropharynx normal.  PEERL,EOMI.   Ears and Nose: No external deformity. CV: RRR, No M/G/R. No JVD. No thrill. No extra heart sounds. PULM: CTA B, no wheezes, crackles, rhonchi. No retractions. No resp. distress. No accessory muscle use. EXTR: No c/c/e NEURO Normal gait.  PSYCH: Normally interactive. Conversant. Not depressed or anxious appearing.  Calm demeanor.   [facility] reading (PRIMARY) by  Dr. Lorelei Pont. CXR: probably scarring RLL- no acute infiltrate noted  CHEST 2 VIEW  COMPARISON: 12/26/2012.  FINDINGS: The cardiac silhouette, mediastinal and hilar contours are within normal limits and stable. There  is tortuosity and calcification of the thoracic aorta. Mild chronic scarring changes but no definite acute overlying pulmonary process. The bony thorax is intact.  IMPRESSION: No acute cardiopulmonary findings. Mild chronic basilar scarring changes.   Results for orders placed in visit on 05/18/13  POCT CBC      Result Value Range   WBC 10.9 (*) 4.6 - 10.2 K/uL   Lymph, poc 3.0  0.6 - 3.4   POC LYMPH PERCENT 27.6  10 - 50 %L   MID (cbc) 0.9  0 - 0.9   POC MID % 8.3  0 - 12 %M   POC Granulocyte 7.0 (*) 2 - 6.9   Granulocyte percent 64.1  37 - 80 %G   RBC 4.38 (*) 4.69 - 6.13 M/uL   Hemoglobin 12.3 (*) 14.1 - 18.1 g/dL   HCT, POC 39.4 (*) 43.5 - 53.7 %   MCV 90.0  80 - 97 fL   MCH, POC 28.1  27 - 31.2 pg   MCHC 31.2 (*) 31.8 - 35.4 g/dL   RDW, POC 17.3     Platelet Count, POC 304  142 - 424 K/uL   MPV 10.5  0 - 99.8 fL   Assessment and Plan: Cough - Plan: POCT CBC, DG Chest 2 View, benzonatate (TESSALON) 100 MG capsule  SOB (shortness of breath) - Plan: Brain natriuretic peptide  Peripheral edema - Plan: Comprehensive metabolic panel  Sneezing - Plan: montelukast (SINGULAIR) 10 MG tablet  Reassured that he does not appear to have pneumonia. The omnicef that he just took is a great choice for chest and sinus infections.  Suspect he will get better soon.  Will add singulair daily and tessalon prn.  If not feeling better soon please give me a call- Sooner if worse.   Will check BNP as he recently had pedal edema, CMP to ensure he is tolerating the tosemide ok  Signed Lamar Blinks, MD

## 2013-05-19 ENCOUNTER — Encounter: Payer: Self-pay | Admitting: Family Medicine

## 2013-05-19 LAB — BRAIN NATRIURETIC PEPTIDE: Brain Natriuretic Peptide: 55.7 pg/mL (ref 0.0–100.0)

## 2013-06-01 ENCOUNTER — Other Ambulatory Visit: Payer: Self-pay | Admitting: Family Medicine

## 2013-07-03 ENCOUNTER — Other Ambulatory Visit: Payer: Self-pay | Admitting: Family Medicine

## 2013-07-16 ENCOUNTER — Ambulatory Visit (INDEPENDENT_AMBULATORY_CARE_PROVIDER_SITE_OTHER): Payer: Medicare Other | Admitting: Family Medicine

## 2013-07-16 VITALS — BP 138/72 | HR 97 | Temp 98.6°F | Resp 18 | Ht 69.0 in | Wt 201.0 lb

## 2013-07-16 DIAGNOSIS — E1165 Type 2 diabetes mellitus with hyperglycemia: Secondary | ICD-10-CM

## 2013-07-16 DIAGNOSIS — E118 Type 2 diabetes mellitus with unspecified complications: Principal | ICD-10-CM

## 2013-07-16 DIAGNOSIS — IMO0002 Reserved for concepts with insufficient information to code with codable children: Secondary | ICD-10-CM

## 2013-07-16 LAB — POCT GLYCOSYLATED HEMOGLOBIN (HGB A1C): Hemoglobin A1C: 8.4

## 2013-07-16 NOTE — Progress Notes (Signed)
[facility] [address], [place] [address] [phone]  Date:  07/16/2013   Name:  Justin [name]   DOB:  03/04/1935   MRN:  [id number]  PCP:  Lamar Blinks, MD    Chief Complaint: Follow-up   History of Present Illness:  Justin [name] is a 79 y.o. very pleasant male patient who presents with the following:  He is here today to recheck his DM.  He has worked on his diet and is eating "mostly greens."   He is levemir and metformin.  His daytime glucose runs 250- 300.  His fasting readings are under 100. He had a cough the last couple of days- he has not had a fever and otherwise feels well. In fact his cough is now better.   His last A1c was in January- it was 9% at that time   Patient Active Problem List   Diagnosis Date Noted  . Personal history of colonic adenomas 04/15/2013  . Chronic pancreatitis 02/26/2013  . Anemia of chronic disease 02/26/2013  . Special screening for malignant neoplasms, colon 02/26/2013  . Essential hypertension, benign 12/14/2012  . Testosterone deficiency 10/22/2011  . Headache above the eye region 02/22/2011  . Type II or unspecified type diabetes mellitus with unspecified complication, uncontrolled 02/22/2011  . TIA on medication 02/22/2011  . Coronary artery disease 02/22/2011    Past Medical History  Diagnosis Date  . HTN (hypertension)   . Diabetes mellitus   . Hypercholesteremia   . TIA (transient ischemic attack)   . Iron deficiency   . Arthritis   . Stroke   . Pancreatitis, acute 06/02/2012    Diagnosed 05/2012   . Personal history of colonic adenomas 04/15/2013    04/15/2013 2 diminutive polyps      Past Surgical History  Procedure Laterality Date  . Back surgery  1985  . Neck surgery  1985  . Temporal artery biopsy / ligation  02/26/2011    left side  . Artery biopsy  02/26/2011    Procedure: BIOPSY TEMPORAL ARTERY;  Surgeon: Willey Blade, MD;  Location: [place];   Service: General;  Laterality: Left;  Left temporal artery biospy  . Cholecystectomy    . Colonoscopy    . Upper gastrointestinal endoscopy    . Eus      History  Substance Use Topics  . Smoking status: Former Smoker -- 35 years    Types: Cigarettes    Quit date: 04/09/1990  . Smokeless tobacco: Never Used  . Alcohol Use: No     Comment: "per pt stopped drinking in the 90's"    Family History  Problem Relation Age of Onset  . Heart disease Mother   . Diabetes Sister     x 2  . Heart attack Sister   . Amblyopia Father     No Known Allergies  Medication list has been reviewed and updated.  Current Outpatient Prescriptions on File Prior to Visit  Medication Sig Dispense Refill  . atorvastatin (LIPITOR) 40 MG tablet Take 1 tablet (40 mg total) by mouth daily.  30 tablet  1  . benzonatate (TESSALON) 100 MG capsule Take 1 capsule (100 mg total) by mouth 3 (three) times daily as needed for cough.  40 capsule  0  . clopidogrel (PLAVIX) 75 MG tablet Take 1 tablet (75 mg total) by mouth daily.  30 tablet  10  . fish oil-omega-3 fatty acids 1000 MG capsule Take 2  g by mouth daily.        . fluticasone (FLONASE) 50 MCG/ACT nasal spray Place 2 sprays into both nostrils daily.  16 g  6  . glucose blood (EASY TOUCH TEST) test strip Use to test blood sugar 3 times daily. Dx code: 250.02  100 each  11  . insulin aspart (NOVOLOG FLEXPEN) 100 UNIT/ML SOPN FlexPen Inject 15 Units into the skin 3 (three) times daily with meals.  5 pen  2  . insulin detemir (LEVEMIR) 100 UNIT/ML injection Inject 0.5 mLs (50 Units total) into the skin 2 (two) times daily.  10 mL  11  . Insulin Pen Needle 32G X 4 MM MISC Use as directed to inject insulin tidwc  100 each  11  . l-methylfolate-B6-B12 (METANX) 3-35-2 MG TABS Take 1 tablet by mouth daily.        . Lancets MISC To check blood glucose tid  100 each  5  . LEVEMIR FLEXTOUCH 100 UNIT/ML SOPN INJECT 50 UNTIS INTO THE SKIN 2 TIMES DAILY  30 mL  4  .  lipase/protease/amylase (CREON-12/PANCREASE) 12000 UNITS CPEP Take 2 capsules by mouth 3 (three) times daily with meals.  540 capsule  1  . meclizine (ANTIVERT) 12.5 MG tablet Take one pill every 8 hours only if needed for dizziness  20 tablet  0  . metFORMIN (GLUCOPHAGE) 1000 MG tablet Take 1 tablet (1,000 mg total) by mouth 2 (two) times daily with a meal.  180 tablet  3  . montelukast (SINGULAIR) 10 MG tablet Take 1 tablet (10 mg total) by mouth daily.  30 tablet  3  . Multiple Vitamins-Minerals (MULTIVITAMIN WITH MINERALS) tablet Take 1 tablet by mouth daily.        Marland Kitchen omeprazole (PRILOSEC) 20 MG capsule TAKE ONE CAPSULE BY MOUTH EVERY DAY  90 capsule  1  . Pancrelipase, Lip-Prot-Amyl, 10500 UNITS CPEP Take 2 capsules (21,000 Units total) by mouth 3 x daily with food.  540 capsule  6  . Pancrelipase, Lip-Prot-Amyl, 5000 UNITS CPEP Take 1 capsule (5,000 Units total) by mouth daily.  30 capsule  11  . torsemide (DEMADEX) 10 MG tablet Take 1 tablet (10 mg total) by mouth daily.  30 tablet  2  . [DISCONTINUED] testosterone cypionate (DEPOTESTOTERONE CYPIONATE) 200 MG/ML injection Inject into the muscle every 14 (fourteen) days.       Current Facility-Administered Medications on File Prior to Visit  Medication Dose Route Frequency Provider Last Rate Last Dose  . testosterone cypionate (DEPOTESTOTERONE CYPIONATE) injection 200 mg  200 mg Intramuscular Q14 Days Robyn Haber, MD   200 mg at 01/20/13 0939  . testosterone cypionate (DEPOTESTOTERONE CYPIONATE) injection 200 mg  200 mg Intramuscular Q14 Days Collene Leyden, PA-C   200 mg at 04/05/13 7106    Review of Systems:  As per HPI- otherwise negative.   Physical Examination: Filed Vitals:   07/16/13 1327  BP: 138/72  Pulse: 97  Temp: 98.6 F (37 C)  Resp: 18   Filed Vitals:   07/16/13 1327  Height: 5\' 9"  (1.753 m)  Weight: 201 lb (91.173 kg)   Body mass index is 29.67 kg/(m^2). Ideal Body Weight: Weight in (lb) to have BMI =  25: 168.9  GEN: WDWN, NAD, Non-toxic, A & O x 3, looks well, accompanied by his wife today HEENT: Atraumatic, Normocephalic. Neck supple. No masses, No LAD.  Bilateral TM wnl, oropharynx normal.  PEERL,EOMI.   Ears and Nose: No external deformity. CV: RRR,  No M/G/R. No JVD. No thrill. No extra heart sounds. PULM: CTA B, no wheezes, crackles, rhonchi. No retractions. No resp. distress. No accessory muscle use. EXTR: No c/c/e NEURO Normal gait.  PSYCH: Normally interactive. Conversant. Not depressed or anxious appearing.  Calm demeanor.  Foot exam today   Results for orders placed in visit on 07/16/13  POCT GLYCOSYLATED HEMOGLOBIN (HGB A1C)      Result Value Ref Range   Hemoglobin A1C 8.4      Assessment and Plan: Type II or unspecified type diabetes mellitus with unspecified complication, uncontrolled - Plan: POCT glycosylated hemoglobin (Hb A1C)  A1c is improved.  Encouraged him to continue his diet changes.  Plan recheck in about 3 months.   Wt Readings from Last 3 Encounters:  07/16/13 201 lb (91.173 kg)  05/18/13 204 lb (92.534 kg)  05/08/13 203 lb 9.6 oz (92.352 kg)     Signed Lamar Blinks, MD

## 2013-07-16 NOTE — Patient Instructions (Signed)
Your a1c test is improved from last time. Please work on your diet (try to watch sugars) and come see me in 3 months for a recheck

## 2013-07-20 ENCOUNTER — Ambulatory Visit (INDEPENDENT_AMBULATORY_CARE_PROVIDER_SITE_OTHER): Payer: Medicare Other | Admitting: Family Medicine

## 2013-07-20 VITALS — BP 120/60 | HR 107 | Temp 98.1°F | Resp 16 | Ht 67.5 in | Wt 199.0 lb

## 2013-07-20 DIAGNOSIS — J449 Chronic obstructive pulmonary disease, unspecified: Secondary | ICD-10-CM

## 2013-07-20 DIAGNOSIS — J209 Acute bronchitis, unspecified: Secondary | ICD-10-CM

## 2013-07-20 DIAGNOSIS — R5381 Other malaise: Secondary | ICD-10-CM

## 2013-07-20 DIAGNOSIS — R5383 Other fatigue: Secondary | ICD-10-CM

## 2013-07-20 MED ORDER — METHYLPREDNISOLONE ACETATE 80 MG/ML IJ SUSP
80.0000 mg | Freq: Once | INTRAMUSCULAR | Status: AC
  Administered 2013-07-20: 80 mg via INTRAMUSCULAR

## 2013-07-20 MED ORDER — HYDROCODONE-HOMATROPINE 5-1.5 MG/5ML PO SYRP: 5.0000 mL | ORAL_SOLUTION | Freq: Three times a day (TID) | ORAL | Status: DC | PRN

## 2013-07-20 MED ORDER — AZITHROMYCIN 250 MG PO TABS: ORAL_TABLET | ORAL | Status: DC

## 2013-07-20 MED ORDER — PRENATAL 19 PO TABS: 1.0000 | ORAL_TABLET | Freq: Every day | ORAL | Status: DC

## 2013-07-20 MED ORDER — METHYLPREDNISOLONE ACETATE 80 MG/ML IJ SUSP: 80.0000 mg | Freq: Once | INTRAMUSCULAR | Status: DC

## 2013-07-20 NOTE — Progress Notes (Signed)
° °  Subjective:    Patient ID: Justin [name], male    DOB: June 07, 1934, 79 y.o.   MRN: [id number] This chart was scribed for Robyn Haber, MD by Terressa Koyanagi, ED Scribe at [facility]. This patient was seen in room Room 4 and the patient's care was started at 9:25 AM.  HPI  HPI Comments: PERCY WINTERROWD is a 79 y.o. male who presents to the [facility] complaining of a sore throat with associated intermittent productive cough and wheezing. Pt reports he is coughing quite a bit at night. Pt denies fever. Pt reports he was seen for said symptoms 4 days ago.   Pt also complains of ankle and feet pain bilaterally. Pt also complains of associated swelling in the same. Pt reports that his ankle is tender to the touch.   Pt is currently not working.  Review of Systems  Constitutional: Negative for fever.  HENT: Positive for sore throat.   Respiratory: Positive for cough (productive cough) and wheezing.   Musculoskeletal:       Ankle and feet pain and swelling bilaterally        Objective:   Physical Exam  No acute distress, cooperative and friendly HEENT: Unremarkable Neck: Supple no adenopathy or JVD Chest: Expiratory wheezes bilaterally with a few inspiratory rales at the bases Heart: Regular.extremities: No edema     Assessment & Plan:  9:27 AM-Discussed treatment plan which includes vitamins, depo-medrol injection, hycodan syrup and Z-pak with pt at bedside and pt agreed to plan.   Acute bronchitis - Plan: methylPREDNISolone acetate (DEPO-MEDROL) injection 80 mg, azithromycin (ZITHROMAX Z-PAK) 250 MG tablet, HYDROcodone-homatropine (HYCODAN) 5-1.5 MG/5ML syrup  Signed, Robyn Haber, MD

## 2013-07-20 NOTE — Patient Instructions (Signed)

## 2013-08-03 ENCOUNTER — Ambulatory Visit: Payer: Medicare Other

## 2013-08-03 ENCOUNTER — Ambulatory Visit (INDEPENDENT_AMBULATORY_CARE_PROVIDER_SITE_OTHER): Payer: Medicare Other | Admitting: Family Medicine

## 2013-08-03 VITALS — BP 128/74 | HR 99 | Temp 98.8°F | Resp 16 | Ht 65.5 in | Wt 194.6 lb

## 2013-08-03 DIAGNOSIS — R1013 Epigastric pain: Secondary | ICD-10-CM

## 2013-08-03 DIAGNOSIS — K861 Other chronic pancreatitis: Secondary | ICD-10-CM

## 2013-08-03 DIAGNOSIS — E119 Type 2 diabetes mellitus without complications: Secondary | ICD-10-CM

## 2013-08-03 LAB — POCT CBC
GRANULOCYTE PERCENT: 68.6 % (ref 37–80)
HCT, POC: 40.5 % — AB (ref 43.5–53.7)
HEMOGLOBIN: 13.2 g/dL — AB (ref 14.1–18.1)
Lymph, poc: 3.3 (ref 0.6–3.4)
MCH: 28.9 pg (ref 27–31.2)
MCHC: 32.6 g/dL (ref 31.8–35.4)
MCV: 88.8 fL (ref 80–97)
MID (cbc): 0.7 (ref 0–0.9)
MPV: 9.7 fL (ref 0–99.8)
POC Granulocyte: 8.8 — AB (ref 2–6.9)
POC LYMPH PERCENT: 25.6 %L (ref 10–50)
POC MID %: 5.8 % (ref 0–12)
Platelet Count, POC: 412 10*3/uL (ref 142–424)
RBC: 4.56 M/uL — AB (ref 4.69–6.13)
RDW, POC: 14.2 %
WBC: 12.8 10*3/uL — AB (ref 4.6–10.2)

## 2013-08-03 LAB — IFOBT (OCCULT BLOOD): IFOBT: NEGATIVE

## 2013-08-03 LAB — GLUCOSE, POCT (MANUAL RESULT ENTRY): POC Glucose: 177 mg/dl — AB (ref 70–99)

## 2013-08-03 IMAGING — CR DG ABDOMEN 2V
2 series · 2 of 2 positions shown · non-contrast
Comparison: CT abdomen pelvis dated [DATE]

CLINICAL DATA: Abdominal pain

EXAM:
ABDOMEN - 2 VIEW

[AP (1 of 2)]
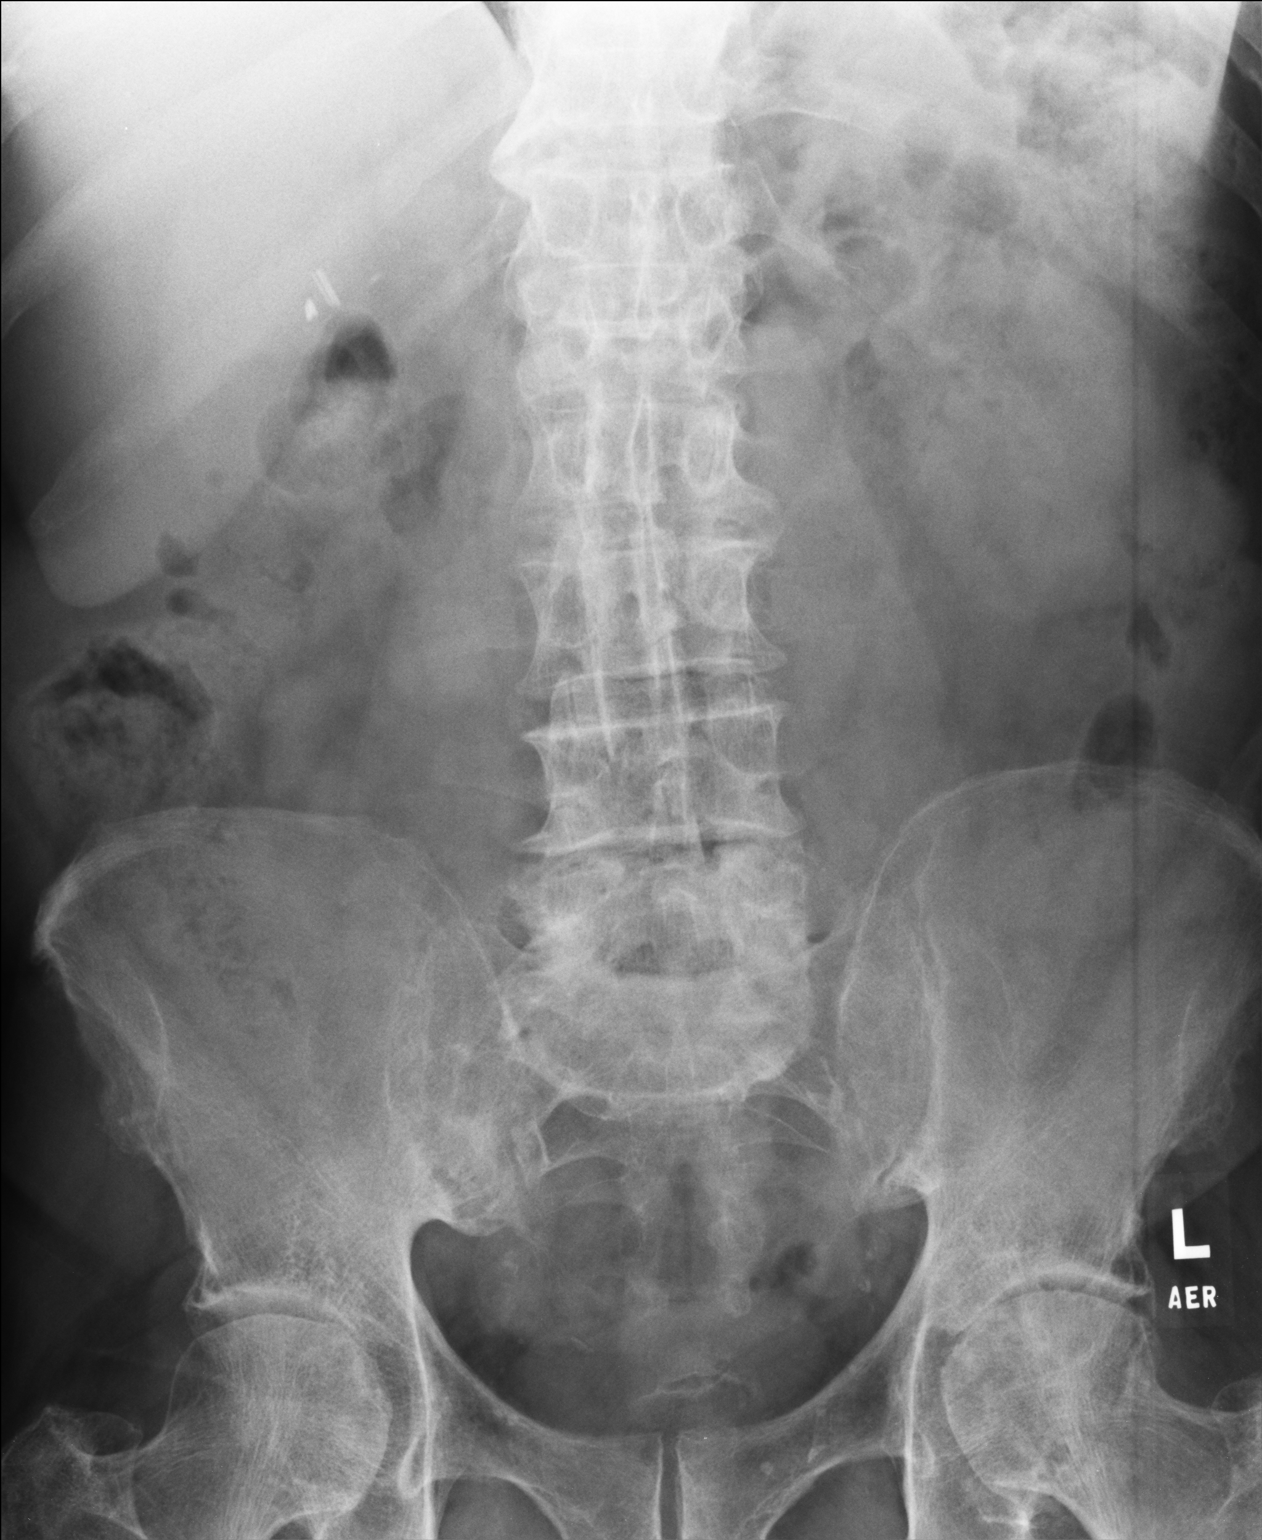

[AP (2 of 2)]
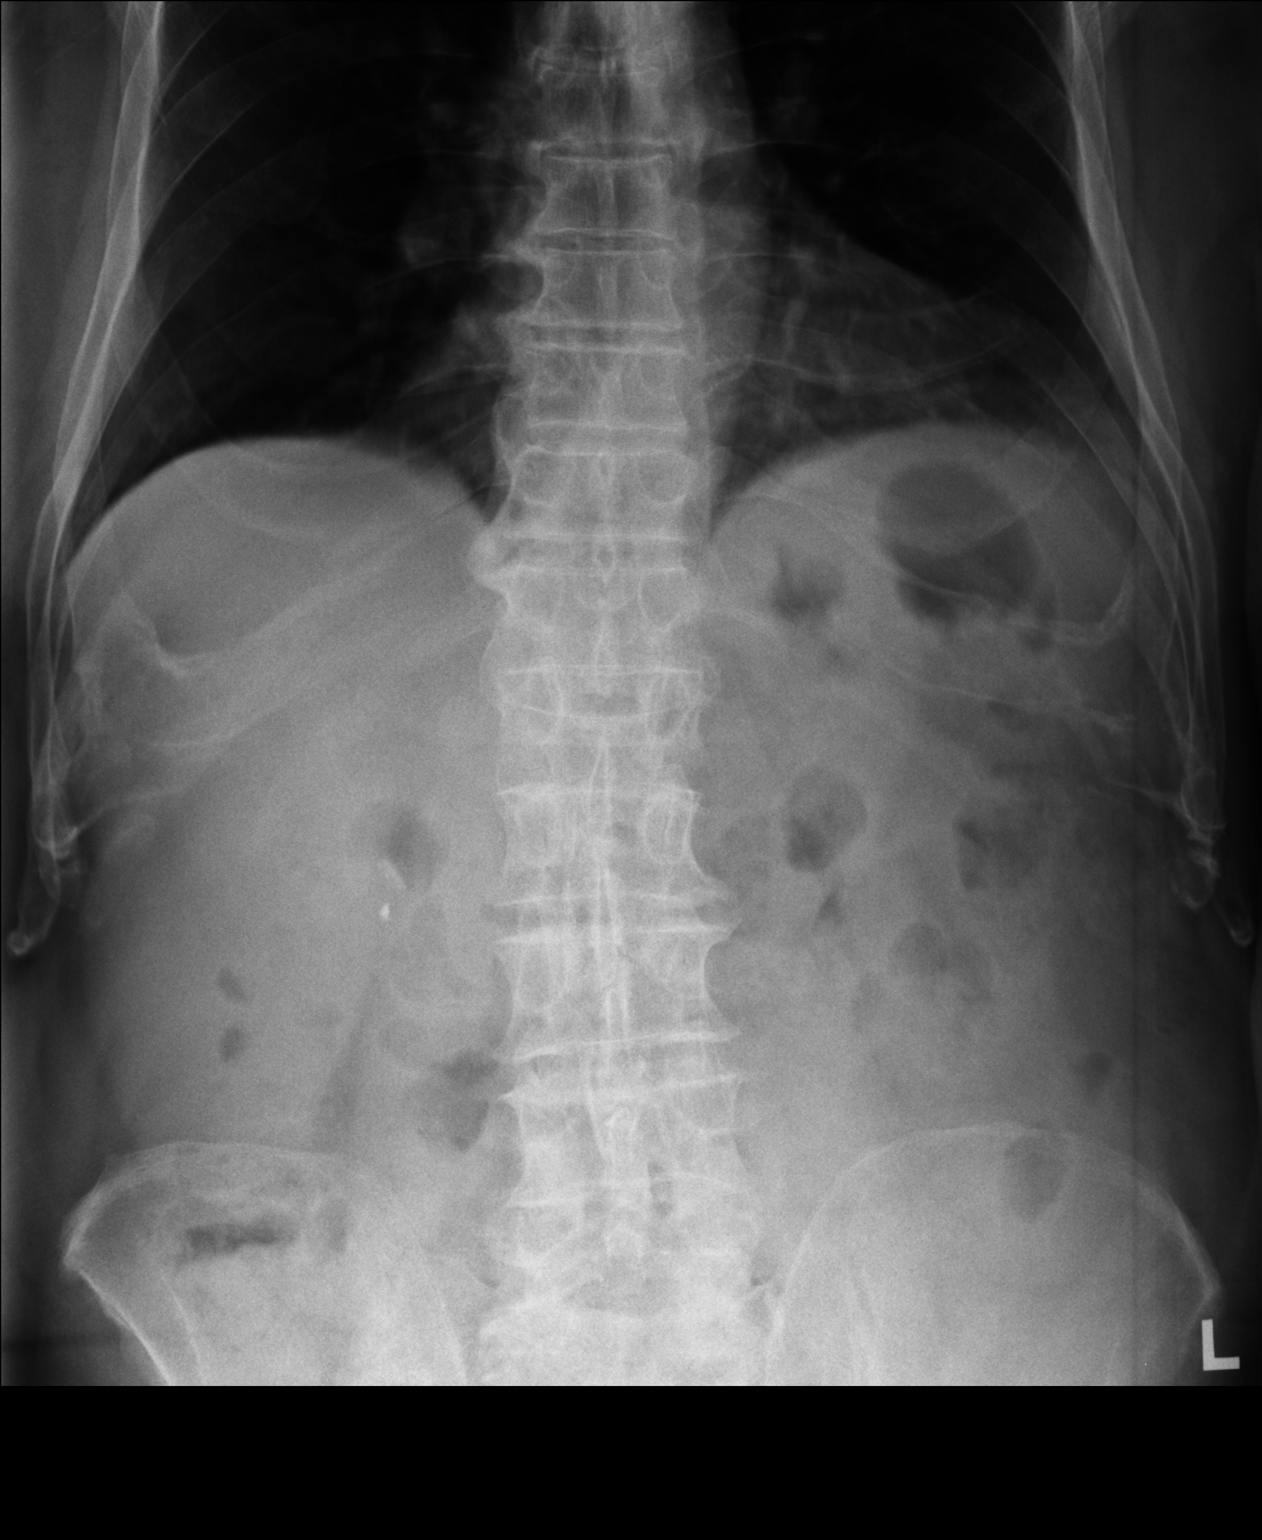

[2 of 2 positions shown; findings below may reference images not displayed]

FINDINGS: Nonobstructive bowel gas pattern.

No evidence of free air under the diaphragm on the upright view.

Cholecystectomy clips.

Degenerative changes of the visualized thoracolumbar spine and
bilateral hips.
IMPRESSION: No evidence of small bowel obstruction or free air.

## 2013-08-03 MED ORDER — OMEPRAZOLE 40 MG PO CPDR: DELAYED_RELEASE_CAPSULE | ORAL | Status: DC

## 2013-08-03 MED ORDER — GI COCKTAIL ~~LOC~~
30.0000 mL | Freq: Once | ORAL | Status: AC
  Administered 2013-08-03: 30 mL via ORAL

## 2013-08-03 MED ORDER — SUCRALFATE 1 G PO TABS: 1.0000 g | ORAL_TABLET | Freq: Three times a day (TID) | ORAL | Status: DC

## 2013-08-03 NOTE — Patient Instructions (Addendum)
This pain seems likely due to chronic pancreatitis.  We should know more after your labs returned.  It is unlikely that you have a stomach ulcer since you had an upper endoscopy recently but we are going to start you on medications for stomach irritation and inflammation by increasing your omeprazole and start as needed carafate (sulcrafate).  If your pain worsens, come back immediately. If it is stable or improving but not yet gone, come back to see me in 1 week for recheck.  Pancrelipase capsules What is this medicine? PANCRELIPASE (pan cre LI pase) helps to improve digestion of food by replacing digestive enzymes. This medicine is used to treat health conditions that cause your body to produce less of these enzymes. This medicine may be used for other purposes; ask your health care provider or pharmacist if you have questions. COMMON BRAND NAME(S): Cotazym S , Creon 10, Creon 20, Creon 5, Creon, Dygase, Ku-Zyme , Ku-Zyme HP, Kutrase, Lapase , Lipram, Lipram-CR, Lipram-PN, Lipram-UL, Palcaps , Pancrease MT, Pancrease, Pancreaze, Pancrecarb MS, Pancron, Pangestyme CN, Pangestyme EC, Pangestyme MT, Pangestyme UL, Panocaps MT, Panocaps, Pertzye, Ultracaps MT, Ultrase MT, Ultrase, McGehee, Zenpep What should I tell my health care provider before I take this medicine? They need to know if you have any of these conditions: -a history of intestinal blockage or a condition called 'fibrosing colonopathy' -abnormally high uric acid in the blood -diabetes -gout -kidney disease -trouble swallowing capsules -an unusual or allergic reaction to pancrelipase, pancreatin, pork, pork protein, other medicines, foods, dyes, or preservatives -pregnant or trying to get pregnant -breast-feeding How should I use this medicine? Take this medicine by mouth with a glass of water. Follow the directions on the prescription label. Take with food. Do not crush or chew the contents of the capsule. If you or your child have  trouble swallowing, you may open the capsule and sprinkle the contents on soft foods that do not require chewing such as applesauce, pureed bananas, or pears. Swallow the mixture right away followed with water or juice. Do not store the mixture. If you are giving this medicine to an infant, you may sprinkle the contents directly into your child's mouth. Give the medicine right before each feeding of formula or breast milk. Do not mix capsule contents directly into formula or breast milk. Make sure the medicine is swallowed completely and that no medicine is left in the mouth. Take your doses at regular intervals. Do not take your medicine more often than directed. A special MedGuide will be given to you by the pharmacist with each prescription and refill of delayed-release capsules (Creon, Zenpep, or Pancreaze). Be sure to read this information carefully each time. Talk to your pediatrician regarding the use of this medicine in children. While this medicine may be prescribed for children for selected conditions precautions do apply. Overdosage: If you think you have taken too much of this medicine contact a poison control center or emergency room at once. NOTE: This medicine is only for you. Do not share this medicine with others. What if I miss a dose? If you miss a dose, take it as soon as you can. If it is almost time for your next dose, take only that dose. Do not take double or extra doses. What may interact with this medicine? -acarbose -antacids containing calcium or magnesium -iron -miglitol This list may not describe all possible interactions. Give your health care provider a list of all the medicines, herbs, non-prescription drugs, or dietary supplements you  use. Also tell them if you smoke, drink alcohol, or use illegal drugs. Some items may interact with your medicine. What should I watch for while using this medicine? Visit your doctor for regular check ups. Talk to your doctor before you  change brands of this medicine. Each brand has different amounts of enzymes. You may need to be on a special diet while taking this medicine. Also, ask your doctor how much water you need to drink. This medicine may increase your chance of having a rare bowel disorder. The risk of having this condition may be reduced by following the dosing directions that your healthcare professional gives you. Call your healthcare professional right away if you have any unusual or severe stomach pain. This medicine may increase blood uric acid levels, for example, worsening of gout, or painful, swollen joints. Call your healthcare professional right away if you have any of these symptoms. Be careful if you open the capsule. This medicine can irritate the lungs if you breathe it in. Also, do not hold the medicine in your mouth or chew it. This may cause mouth sores. This medicine may affect blood sugar levels. If you have diabetes, check with your doctor or health care professional before you change your diet or the dose of your diabetic medicine. Women should inform their doctor if they wish to become pregnant or think they might be pregnant. What side effects may I notice from receiving this medicine? Side effects that you should report to your doctor or health care professional as soon as possible: -allergic reactions like skin rash, itching or hives, swelling of the face, lips, or tongue -fever or chills, sore throat -severe stomach pain or bloating -shortness of breath -skin rash -trouble passing stool -vomiting Side effects that usually do not require medical attention (report to your doctor or health care professional if they continue or are bothersome): -constipation or diarrhea -cough -dizziness -headache -nausea -stomach gas -stomach pain -weight loss This list may not describe all possible side effects. Call your doctor for medical advice about side effects. You may report side effects to FDA at  1-800-FDA-1088. Where should I keep my medicine? Keep out of the reach of children. Store at room temperature between 15 and 25 degrees C (59 and 77 degrees F). Do not refrigerate. Protect from moisture. Throw away any unused medicine after the expiration date. NOTE: This sheet is a summary. It may not cover all possible information. If you have questions about this medicine, talk to your doctor, pharmacist, or health care provider.  2014, Elsevier/Gold Standard. (2011-07-25 10:16:32) Acute Pancreatitis Acute pancreatitis is a disease in which the pancreas becomes suddenly irritated (inflamed). The pancreas is a large gland behind your stomach. The pancreas makes enzymes that help digest food. The pancreas also makes 2 hormones that help control your blood sugar. Acute pancreatitis happens when the enzymes attack and damage the pancreas. Most attacks last a couple of days and can cause serious problems. HOME CARE  Follow your doctor's diet instructions. You may need to avoid alcohol and limit fat in your diet.  Eat small meals often.  Drink enough fluids to keep your pee (urine) clear or pale yellow.  Only take medicines as told by your doctor.  Avoid drinking alcohol if it caused your disease.  Do not smoke.  Get plenty of rest.  Check your blood sugar at home as told by your doctor.  Keep all doctor visits as told. GET HELP RIGHT AWAY IF:  You are unable to eat or keep fluids down.  Your pain becomes severe.  You have a fever or lasting symptoms for more than 2 to 3 days.  You have a fever and your symptoms suddenly get worse.  Your skin or the white part of your eyes turn yellow (jaundice).  You throw up (vomit).  You feel dizzy, or you pass out (faint).  Your blood sugar is high (over 300 mg/dL).  You do not get better as quickly as expected.  You have new or worsening symptoms.  You have lasting pain, weakness, or feel sick to your stomach (nauseous).  You  get better and then have another pain attack. MAKE SURE YOU:   Understand these instructions.  Will watch your condition.  Will get help right away if you are not doing well or get worse. Document Released: 09/12/2007 Document Revised: 09/25/2011 Document Reviewed: 07/05/2011 [facility] Patient Information 2014 [place].

## 2013-08-03 NOTE — Progress Notes (Addendum)
Subjective:    Patient ID: Justin [name], male    DOB: [birth year], 79 y.o.   MRN: [id number] This chart was scribed for Justin Cheadle, MD by Vernell Barrier, Medical Scribe. This patient's care was started at 5:42 PM.  Chief Complaint  Patient presents with  . Abdominal Pain    last week than it went away. Now its back   Abdominal Pain Associated symptoms include diarrhea. Pertinent negatives include no constipation, dysuria, fever, frequency, nausea or vomiting.   HPI Comments: Justin [name] is a 79 y.o. male w/ hx of chronic pancreatis and IDDM presents to the [facility] complaining of intermittent dull, achy, abdominal soreness; initial onset a couple of weeks ago. Reports pain similar to pancreatitis pain but not near as severe. Discomfort does not interrupt with sleep but present upon going to sleep and waking up. Nothing makes the discomfort worse or better, inc food or movement . Reports he does not eat much fried food; tries to eat dark leafy greens and vegetables. No pain with dairy intake. Has 2-3 loose bowel movements per day; states this is normal for him. Been passing gas more lately; states this is not normal. Has not tried any medication to relieve pain. Denies nausea, vomiting, fever, chills, hematochezia.   Went to [facility] approximately 6 weeks ago after current pain got so bad and was hospitalized for 3 days. Had upper endoscopy completed during that visit which was neg. (Timeline unclear on this - may have been over a year ago??) Takes omeprazole 20mg  qd. Pain decreased following that visit. Had a colonoscopy 3-4 months ago which came back normal.   Pt has DM II and says sugar fluctuates. Was 86 this morning when checked. Says that is usually as low as it gets. Normally around 115. Has seen numbers around 400 before. Numbers come down after taking medication.  Was seen 1 week ago by Dr. Joseph Art for cold symptoms. Treated with a  Depo-Medrol injection and Z-Pak. Does report some of those symptoms are mild but still present.  Patient Active Problem List   Diagnosis Date Noted  . Personal history of colonic adenomas 04/15/2013  . Chronic pancreatitis 02/26/2013  . Anemia of chronic disease 02/26/2013  . Special screening for malignant neoplasms, colon 02/26/2013  . Essential hypertension, benign 12/14/2012  . Testosterone deficiency 10/22/2011  . Headache above the eye region 02/22/2011  . Type II or unspecified type diabetes mellitus with unspecified complication, uncontrolled 02/22/2011  . TIA on medication 02/22/2011  . Coronary artery disease 02/22/2011   Past Medical History  Diagnosis Date  . HTN (hypertension)   . Diabetes mellitus   . Hypercholesteremia   . TIA (transient ischemic attack)   . Iron deficiency   . Arthritis   . Stroke   . Pancreatitis, acute 06/02/2012    Diagnosed 05/2012   . Personal history of colonic adenomas 04/15/2013    04/15/2013 2 diminutive polyps     Past Surgical History  Procedure Laterality Date  . Back surgery  1985  . Neck surgery  1985  . Temporal artery biopsy / ligation  02/26/2011    left side  . Artery biopsy  02/26/2011    Procedure: BIOPSY TEMPORAL ARTERY;  Surgeon: Willey Blade, MD;  Location: [place];  Service: General;  Laterality: Left;  Left temporal artery biospy  . Cholecystectomy    . Colonoscopy    . Upper gastrointestinal endoscopy    .  Eus     No Known Allergies Prior to Admission medications   Medication Sig Start Date End Date Taking? Authorizing Provider  clopidogrel (PLAVIX) 75 MG tablet Take 1 tablet (75 mg total) by mouth daily. 12/14/12  Yes Sherren Mocha, MD  fish oil-omega-3 fatty acids 1000 MG capsule Take 2 g by mouth daily.     Yes Historical Provider, MD  fluticasone (FLONASE) 50 MCG/ACT nasal spray Place 2 sprays into both nostrils daily. 05/08/13  Yes Peyton Najjar, MD  glucose blood (EASY TOUCH TEST) test strip  Use to test blood sugar 3 times daily. Dx code: 250.02 02/11/13  Yes Eleanore Delia Chimes, PA-C  insulin aspart (NOVOLOG FLEXPEN) 100 UNIT/ML SOPN FlexPen Inject 15 Units into the skin 3 (three) times daily with meals. 12/16/12  Yes Heather M Marte, PA-C  insulin detemir (LEVEMIR) 100 UNIT/ML injection Inject 0.5 mLs (50 Units total) into the skin 2 (two) times daily. 09/27/12  Yes Peyton Najjar, MD  Insulin Pen Needle 32G X 4 MM MISC Use as directed to inject insulin tidwc 12/14/12  Yes Sherren Mocha, MD  l-methylfolate-B6-B12 [facility]) 3-35-2 MG TABS Take 1 tablet by mouth daily.     Yes Historical Provider, MD  Lancets MISC To check blood glucose tid 01/19/12  Yes Elvina Sidle, MD  LEVEMIR FLEXTOUCH 100 UNIT/ML SOPN INJECT 50 UNTIS INTO THE SKIN 2 TIMES DAILY 03/23/13  Yes Gwenlyn Found Copland, MD  lipase/protease/amylase (CREON-12/PANCREASE) 12000 UNITS CPEP Take 2 capsules by mouth 3 (three) times daily with meals. 08/26/12  Yes Elvina Sidle, MD  meclizine (ANTIVERT) 12.5 MG tablet Take one pill every 8 hours only if needed for dizziness 02/02/13  Yes Peyton Najjar, MD  metFORMIN (GLUCOPHAGE) 1000 MG tablet Take 1 tablet (1,000 mg total) by mouth 2 (two) times daily with a meal. 12/14/12 12/14/13 Yes Sherren Mocha, MD  Multiple Vitamins-Minerals (MULTIVITAMIN WITH MINERALS) tablet Take 1 tablet by mouth daily.     Yes Historical Provider, MD  omeprazole (PRILOSEC) 20 MG capsule TAKE ONE CAPSULE BY MOUTH EVERY DAY 12/23/12  Yes Elvina Sidle, MD  Pancrelipase, Lip-Prot-Amyl, 10500 UNITS CPEP Take 2 capsules (21,000 Units total) by mouth 3 x daily with food. 08/16/12  Yes Elvina Sidle, MD  Pancrelipase, Lip-Prot-Amyl, 5000 UNITS CPEP Take 1 capsule (5,000 Units total) by mouth daily. 08/29/12  Yes Elvina Sidle, MD  Prenatal Vit-DSS-Fe Fum-FA (PRENATAL 19) tablet Take 1 tablet by mouth daily. 07/20/13  Yes Elvina Sidle, MD  torsemide (DEMADEX) 10 MG tablet Take 1 tablet (10 mg total) by mouth daily. 05/08/13   Yes Peyton Najjar, MD  atorvastatin (LIPITOR) 40 MG tablet Take 1 tablet (40 mg total) by mouth daily. 07/03/13   Gwenlyn Found Copland, MD  HYDROcodone-homatropine (HYCODAN) 5-1.5 MG/5ML syrup Take 5 mLs by mouth every 8 (eight) hours as needed for cough. 07/20/13   Elvina Sidle, MD  montelukast (SINGULAIR) 10 MG tablet Take 1 tablet (10 mg total) by mouth daily. 05/18/13   Pearline Cables, MD   History   Social History  . Marital Status: Married    Spouse Name: N/A    Number of Children: 6  . Years of Education: N/A   Occupational History  . Not on file.   Social History Main Topics  . Smoking status: Former Smoker -- 35 years    Types: Cigarettes    Quit date: 04/09/1990  . Smokeless tobacco: Never Used  . Alcohol Use: No  Comment: "per pt stopped drinking in the 90's"  . Drug Use: No  . Sexual Activity: Not on file   Other Topics Concern  . Not on file   Social History Narrative  . No narrative on file   Review of Systems  Constitutional: Negative for fever, chills, diaphoresis, activity change and appetite change.  Respiratory: Negative for shortness of breath.   Cardiovascular: Negative for chest pain.  Gastrointestinal: Positive for abdominal pain and diarrhea. Negative for nausea, vomiting, constipation, blood in stool, abdominal distention, anal bleeding and rectal pain.  Genitourinary: Negative for dysuria, frequency and decreased urine volume.  Hematological: Negative for adenopathy.  Psychiatric/Behavioral: Negative for sleep disturbance.   BP 128/74  Pulse 99  Temp(Src) 98.8 F (37.1 C) (Oral)  Resp 16  Ht 5' 5.5" (1.664 m)  Wt 194 lb 9.6 oz (88.27 kg)  BMI 31.88 kg/m2  SpO2 97%   Objective:  Physical Exam  Vitals reviewed. Constitutional: He is oriented to person, place, and time. He appears well-developed and well-nourished. No distress.  HENT:  Head: Normocephalic and atraumatic.  Eyes: EOM are normal.  Neck: Neck supple.  Cardiovascular:  Tachycardia present.   No murmur heard. Pulmonary/Chest: Effort normal. No respiratory distress.  Poor inspiration but clear throughout.  Abdominal: Soft. Normal appearance. He exhibits distension (mild). Bowel sounds are decreased. There is hepatomegaly (? possible). There is tenderness in the epigastric area. There is no rigidity, no rebound and no guarding.  Diastasis recti  Genitourinary: Rectum normal and prostate normal. Rectal exam shows no mass, no tenderness and anal tone normal.  Musculoskeletal: Normal range of motion.  Lymphadenopathy:    He has no cervical adenopathy.  Neurological: He is alert and oriented to person, place, and time.  Skin: Skin is warm and dry.  Psychiatric: He has a normal mood and affect. His behavior is normal.   Primary reading by Dr. Brigitte Pulse: 2 View abdomen: Normal gas pattern. Moderate stool burden. Question of clips from cholecystectomy. Lung base is clear.  CLINICAL DATA: Abdominal pain  EXAM: ABDOMEN - 2 VIEW  COMPARISON: CT abdomen pelvis dated 01/03/2014  FINDINGS: Nonobstructive bowel gas pattern.  No evidence of free air under the diaphragm on the upright view.  Cholecystectomy clips.  Degenerative changes of the visualized thoracolumbar spine and bilateral hips.  IMPRESSION: No evidence of small bowel obstruction or free air.   Electronically Signed By: Julian Hy M.D. On: 08/04/2013 08:02   6:52 PM: Patient received a GI cocktail and reports abdominal soreness is improving.  Results for orders placed in visit on 08/03/13  POCT CBC      Result Value Ref Range   WBC 12.8 (*) 4.6 - 10.2 K/uL   Lymph, poc 3.3  0.6 - 3.4   POC LYMPH PERCENT 25.6  10 - 50 %L   MID (cbc) 0.7  0 - 0.9   POC MID % 5.8  0 - 12 %M   POC Granulocyte 8.8 (*) 2 - 6.9   Granulocyte percent 68.6  37 - 80 %G   RBC 4.56 (*) 4.69 - 6.13 M/uL   Hemoglobin 13.2 (*) 14.1 - 18.1 g/dL   HCT, POC 40.5 (*) 43.5 - 53.7 %   MCV 88.8  80 - 97 fL   MCH, POC  28.9  27 - 31.2 pg   MCHC 32.6  31.8 - 35.4 g/dL   RDW, POC 14.2     Platelet Count, POC 412  142 - 424 K/uL   MPV  9.7  0 - 99.8 fL  IFOBT (OCCULT BLOOD)      Result Value Ref Range   IFOBT Negative    GLUCOSE, POCT (MANUAL RESULT ENTRY)      Result Value Ref Range   POC Glucose 177 (*) 70 - 99 mg/dl    Assessment & Plan:    Abdominal pain, epigastric - Plan: POCT CBC, IFOBT POC (occult bld, rslt in office), POCT glucose (manual entry), Comprehensive metabolic panel, Lipase, DG Abd 2 Views, gi cocktail (Maalox,Lidocaine,Donnatal). Pt did have recent bronchitis w/ mildly elev leuks w/ left shift now - lung bases on xray clear but if cough cont consider cxr and antibiotic therapy.  Recheck 1 wk, sooner if worsening.  Diabetes - Plan: POCT glucose (manual entry), Comprehensive metabolic panel  Chronic pancreatitis - suspect underlying chronic pancreatitis as cause of sxs - lipase pending.  Pt seen by Dr. Carlean Purl so may need f/u w/ him if sxs not improving.  Start carafate and double omeprazole from 20 to 40 mg temporarily but will try to decrease back to 20 within 8 weeks due to interaction with Plavix.  Meds ordered this encounter  Medications  . DISCONTD: furosemide (LASIX) 20 MG tablet    Sig: Take 20 mg by mouth.  . gi cocktail (Maalox,Lidocaine,Donnatal)    Sig:   . omeprazole (PRILOSEC) 40 MG capsule    Sig: TAKE ONE CAPSULE BY MOUTH EVERY DAY    Dispense:  90 capsule    Refill:  0  . sucralfate (CARAFATE) 1 G tablet    Sig: Take 1 tablet (1 g total) by mouth 4 (four) times daily -  with meals and at bedtime.    Dispense:  120 tablet    Refill:  0    I personally performed the services described in this documentation, which was scribed in my presence. The recorded information has been reviewed and considered, and addended by me as needed.  Justin Cheadle, MD MPH

## 2013-08-04 LAB — LIPASE: LIPASE: 63 U/L (ref 0–75)

## 2013-08-04 LAB — COMPREHENSIVE METABOLIC PANEL
ALBUMIN: 3.9 g/dL (ref 3.5–5.2)
ALT: 16 U/L (ref 0–53)
AST: 16 U/L (ref 0–37)
Alkaline Phosphatase: 87 U/L (ref 39–117)
BUN: 21 mg/dL (ref 6–23)
CALCIUM: 9.4 mg/dL (ref 8.4–10.5)
CO2: 27 meq/L (ref 19–32)
Chloride: 100 mEq/L (ref 96–112)
Creat: 1.16 mg/dL (ref 0.50–1.35)
Glucose, Bld: 153 mg/dL — ABNORMAL HIGH (ref 70–99)
POTASSIUM: 4.5 meq/L (ref 3.5–5.3)
SODIUM: 135 meq/L (ref 135–145)
TOTAL PROTEIN: 8.2 g/dL (ref 6.0–8.3)
Total Bilirubin: 0.6 mg/dL (ref 0.2–1.2)

## 2013-08-10 ENCOUNTER — Ambulatory Visit (INDEPENDENT_AMBULATORY_CARE_PROVIDER_SITE_OTHER): Payer: Medicare Other | Admitting: Family Medicine

## 2013-08-10 VITALS — BP 122/74 | HR 91 | Temp 98.6°F | Resp 16 | Ht 68.0 in | Wt 197.0 lb

## 2013-08-10 DIAGNOSIS — R109 Unspecified abdominal pain: Secondary | ICD-10-CM

## 2013-08-10 DIAGNOSIS — R5381 Other malaise: Secondary | ICD-10-CM

## 2013-08-10 DIAGNOSIS — IMO0001 Reserved for inherently not codable concepts without codable children: Secondary | ICD-10-CM

## 2013-08-10 DIAGNOSIS — E1165 Type 2 diabetes mellitus with hyperglycemia: Principal | ICD-10-CM

## 2013-08-10 DIAGNOSIS — K5900 Constipation, unspecified: Secondary | ICD-10-CM

## 2013-08-10 DIAGNOSIS — R5383 Other fatigue: Secondary | ICD-10-CM

## 2013-08-10 LAB — POCT CBC
Granulocyte percent: 71.7 %G (ref 37–80)
HEMATOCRIT: 39.1 % — AB (ref 43.5–53.7)
HEMOGLOBIN: 12.9 g/dL — AB (ref 14.1–18.1)
LYMPH, POC: 2.2 (ref 0.6–3.4)
MCH: 29.2 pg (ref 27–31.2)
MCHC: 33 g/dL (ref 31.8–35.4)
MCV: 88.4 fL (ref 80–97)
MID (cbc): 0.7 (ref 0–0.9)
MPV: 9.4 fL (ref 0–99.8)
POC Granulocyte: 7.5 — AB (ref 2–6.9)
POC LYMPH PERCENT: 21.2 %L (ref 10–50)
POC MID %: 7.1 %M (ref 0–12)
Platelet Count, POC: 396 10*3/uL (ref 142–424)
RBC: 4.42 M/uL — AB (ref 4.69–6.13)
RDW, POC: 14.5 %
WBC: 10.5 10*3/uL — AB (ref 4.6–10.2)

## 2013-08-10 LAB — GLUCOSE, POCT (MANUAL RESULT ENTRY): POC GLUCOSE: 149 mg/dL — AB (ref 70–99)

## 2013-08-10 NOTE — Patient Instructions (Addendum)
Take the plain Dulcolax tablets daily for a few days until bowels are moving regularly, then decrease to every other day or 2 or 3 times a week.  Drink plenty of water  If the bowels do not seem to do better on the Dulcolax take a dose of MiraLax along with it.  Return if worse or not improving  For a recheck, sooner if problems

## 2013-08-10 NOTE — Progress Notes (Signed)
Subjective: 79 year old man previously well known to me. He has been in here recently with what was felt to be abdominal pain from his chronic pancreatitis. He is not having any severe abdominal pain, but he continues to have generalized abdominal discomfort. 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 He wants to be able to get his garden planted. No other specific complaints. No shortness of breath, chest pain, heart or urinary problems.  No other specialists.  Got tired of going to doctors who were not helping him.  Objective: Chest clear. Heart regular without murmurs. Abdomen has generalized tenderness and fullness to it no masses is a little dull to percussion. Extremities normal.  Assessment: Generalized abdominal pains Fatigue Constipation Type 2 diabetes Chronic pancreatitis  Plan: Results for orders placed in visit on 08/10/13  POCT CBC      Result Value Ref Range   WBC 10.5 (*) 4.6 - 10.2 K/uL   Lymph, poc 2.2  0.6 - 3.4   POC LYMPH PERCENT 21.2  10 - 50 %L   MID (cbc) 0.7  0 - 0.9   POC MID % 7.1  0 - 12 %M   POC Granulocyte 7.5 (*) 2 - 6.9   Granulocyte percent 71.7  37 - 80 %G   RBC 4.42 (*) 4.69 - 6.13 M/uL   Hemoglobin 12.9 (*) 14.1 - 18.1 g/dL   HCT, POC 39.1 (*) 43.5 - 53.7 %   MCV 88.4  80 - 97 fL   MCH, POC 29.2  27 - 31.2 pg   MCHC 33.0  31.8 - 35.4 g/dL   RDW, POC 14.5     Platelet Count, POC 396  142 - 424 K/uL   MPV 9.4  0 - 99.8 fL  GLUCOSE, POCT (MANUAL RESULT ENTRY)      Result Value Ref Range   POC Glucose 149 (*) 70 - 99 mg/dl

## 2013-09-09 ENCOUNTER — Other Ambulatory Visit: Payer: Self-pay | Admitting: Family Medicine

## 2013-09-10 NOTE — Telephone Encounter (Signed)
Pt called because his pharmacy let him know he was out of insulin refills

## 2013-09-14 ENCOUNTER — Other Ambulatory Visit: Payer: Self-pay | Admitting: Family Medicine

## 2013-09-17 ENCOUNTER — Other Ambulatory Visit: Payer: Self-pay | Admitting: Family Medicine

## 2013-09-24 ENCOUNTER — Telehealth: Payer: Self-pay

## 2013-09-24 NOTE — Telephone Encounter (Signed)
[facility] faxed order for DM shoes for pt. I have put form in Dr ONEOK box.

## 2013-09-30 ENCOUNTER — Encounter: Payer: Self-pay | Admitting: Family Medicine

## 2013-09-30 ENCOUNTER — Ambulatory Visit (INDEPENDENT_AMBULATORY_CARE_PROVIDER_SITE_OTHER): Payer: Medicare Other | Admitting: Family Medicine

## 2013-09-30 VITALS — BP 128/64 | HR 90 | Temp 98.3°F | Resp 18 | Ht 68.0 in | Wt 200.4 lb

## 2013-09-30 DIAGNOSIS — IMO0002 Reserved for concepts with insufficient information to code with codable children: Secondary | ICD-10-CM

## 2013-09-30 DIAGNOSIS — E118 Type 2 diabetes mellitus with unspecified complications: Principal | ICD-10-CM

## 2013-09-30 DIAGNOSIS — Z23 Encounter for immunization: Secondary | ICD-10-CM

## 2013-09-30 DIAGNOSIS — M2040 Other hammer toe(s) (acquired), unspecified foot: Secondary | ICD-10-CM

## 2013-09-30 DIAGNOSIS — E1165 Type 2 diabetes mellitus with hyperglycemia: Secondary | ICD-10-CM

## 2013-09-30 LAB — LIPID PANEL
CHOLESTEROL: 115 mg/dL (ref 0–200)
HDL: 36 mg/dL — AB (ref >39)
LDL Cholesterol: 65 mg/dL (ref 0–99)
Total CHOL/HDL Ratio: 3.2 Ratio
Triglycerides: 70 mg/dL (ref <150)
VLDL: 14 mg/dL (ref 0–40)

## 2013-09-30 LAB — POCT GLYCOSYLATED HEMOGLOBIN (HGB A1C): HEMOGLOBIN A1C: 8.2

## 2013-09-30 NOTE — Telephone Encounter (Signed)
Pt came into [address] to see Dr Lorelei Pont today. Order for DM shoes and OV notes were faxed to [facility] w/confirmation. Scanned order.

## 2013-09-30 NOTE — Progress Notes (Addendum)
[facility] [address] [place]  [address] [phone]  Date:  09/30/2013   Name:  Justin [name]   DOB:  1934/11/05   MRN:  [id number]  PCP:  Lamar Blinks, MD    Chief Complaint: Follow-up   History of Present Illness:  Justin [name] is a 79 y.o. very pleasant male patient who presents with the following:  Here today for a recheck and to get paperwork filled out for diabetic shoes.  He has received diabetic shoes in the past, and has found them to be hlepful.  He does notice pain in his feet bilaterally Most recent A1c was 8.4 percent 07/16/2013.   He had an accident about 3 years ago when a forklift fell onto his legs and broke his ankles.  This did lead to some OA  He did have a pneumovax in 2012- needs prevnar today He is fasting today for labs.   Patient Active Problem List   Diagnosis Date Noted  . Personal history of colonic adenomas 04/15/2013  . Chronic pancreatitis 02/26/2013  . Anemia of chronic disease 02/26/2013  . Special screening for malignant neoplasms, colon 02/26/2013  . Essential hypertension, benign 12/14/2012  . Testosterone deficiency 10/22/2011  . Headache above the eye region 02/22/2011  . Type II or unspecified type diabetes mellitus with unspecified complication, uncontrolled 02/22/2011  . TIA on medication 02/22/2011  . Coronary artery disease 02/22/2011    Past Medical History  Diagnosis Date  . HTN (hypertension)   . Diabetes mellitus   . Hypercholesteremia   . TIA (transient ischemic attack)   . Iron deficiency   . Arthritis   . Stroke   . Pancreatitis, acute 06/02/2012    Diagnosed 05/2012   . Personal history of colonic adenomas 04/15/2013    04/15/2013 2 diminutive polyps      Past Surgical History  Procedure Laterality Date  . Back surgery  1985  . Neck surgery  1985  . Temporal artery biopsy / ligation  02/26/2011    left side  . Artery biopsy  02/26/2011    Procedure: BIOPSY TEMPORAL ARTERY;   Surgeon: Willey Blade, MD;  Location: [facility];  Service: General;  Laterality: Left;  Left temporal artery biospy  . Cholecystectomy    . Colonoscopy    . Upper gastrointestinal endoscopy    . Eus      History  Substance Use Topics  . Smoking status: Former Smoker -- 35 years    Types: Cigarettes    Quit date: 04/09/1990  . Smokeless tobacco: Never Used  . Alcohol Use: No     Comment: "per pt stopped drinking in the 90's"    Family History  Problem Relation Age of Onset  . Heart disease Mother   . Diabetes Sister     x 2  . Heart attack Sister   . Amblyopia Father     No Known Allergies  Medication list has been reviewed and updated.  Current Outpatient Prescriptions on File Prior to Visit  Medication Sig Dispense Refill  . atorvastatin (LIPITOR) 40 MG tablet TAKE ONE TABLET BY MOUTH ONCE DAILY  30 tablet  1  . clopidogrel (PLAVIX) 75 MG tablet Take 1 tablet (75 mg total) by mouth daily.  30 tablet  10  . fish oil-omega-3 fatty acids 1000 MG capsule Take 2 g by mouth daily.        . fluticasone (FLONASE) 50 MCG/ACT nasal spray Place 2  sprays into both nostrils daily.  16 g  6  . glucose blood (EASY TOUCH TEST) test strip Use to test blood sugar 3 times daily. Dx code: 250.02  100 each  11  . insulin aspart (NOVOLOG FLEXPEN) 100 UNIT/ML SOPN FlexPen Inject 15 Units into the skin 3 (three) times daily with meals.  5 pen  2  . insulin detemir (LEVEMIR) 100 UNIT/ML injection Inject 0.5 mLs (50 Units total) into the skin 2 (two) times daily.  10 mL  11  . Insulin Pen Needle 32G X 4 MM MISC Use as directed to inject insulin tidwc  100 each  11  . l-methylfolate-B6-B12 (METANX) 3-35-2 MG TABS Take 1 tablet by mouth daily.        . Lancets MISC To check blood glucose tid  100 each  5  . LEVEMIR FLEXTOUCH 100 UNIT/ML Pen INJECT 50 UNITS INTO THE SKIN TWICE A DAY  30 mL  5  . lipase/protease/amylase (CREON-12/PANCREASE) 12000 UNITS CPEP Take 2 capsules by  mouth 3 (three) times daily with meals.  540 capsule  1  . meclizine (ANTIVERT) 12.5 MG tablet Take one pill every 8 hours only if needed for dizziness  20 tablet  0  . metFORMIN (GLUCOPHAGE) 1000 MG tablet Take 1 tablet (1,000 mg total) by mouth 2 (two) times daily with a meal.  180 tablet  3  . Multiple Vitamins-Minerals (MULTIVITAMIN WITH MINERALS) tablet Take 1 tablet by mouth daily.        Marland Kitchen omeprazole (PRILOSEC) 40 MG capsule TAKE ONE CAPSULE BY MOUTH ONCE DAILY  90 capsule  0  . Pancrelipase, Lip-Prot-Amyl, 10500 UNITS CPEP Take 2 capsules (21,000 Units total) by mouth 3 x daily with food.  540 capsule  6  . Pancrelipase, Lip-Prot-Amyl, 5000 UNITS CPEP Take 1 capsule (5,000 Units total) by mouth daily.  30 capsule  11  . Prenatal Vit-DSS-Fe Fum-FA (PRENATAL 19) tablet Take 1 tablet by mouth daily.  30 tablet  1  . sucralfate (CARAFATE) 1 G tablet Take 1 tablet (1 g total) by mouth 4 (four) times daily -  with meals and at bedtime.  120 tablet  0  . torsemide (DEMADEX) 10 MG tablet Take 1 tablet (10 mg total) by mouth daily.  30 tablet  2  . [DISCONTINUED] testosterone cypionate (DEPOTESTOTERONE CYPIONATE) 200 MG/ML injection Inject into the muscle every 14 (fourteen) days.       Current Facility-Administered Medications on File Prior to Visit  Medication Dose Route Frequency Provider Last Rate Last Dose  . testosterone cypionate (DEPOTESTOTERONE CYPIONATE) injection 200 mg  200 mg Intramuscular Q14 Days Robyn Haber, MD   200 mg at 01/20/13 0939  . testosterone cypionate (DEPOTESTOTERONE CYPIONATE) injection 200 mg  200 mg Intramuscular Q14 Days Collene Leyden, PA-C   200 mg at 04/05/13 7253    Review of Systems:  As per HPI- otherwise negative.   Physical Examination: Filed Vitals:   09/30/13 1031  BP: 128/64  Pulse: 90  Temp: 98.3 F (36.8 C)  Resp: 18   Filed Vitals:   09/30/13 1031  Height: 5\' 8"  (1.727 m)  Weight: 200 lb 6.4 oz (90.901 kg)   Body mass index is  30.48 kg/(m^2). Ideal Body Weight: Weight in (lb) to have BMI = 25: 164.1  GEN: WDWN, NAD, Non-toxic, A & O x 3, looks well HEENT: Atraumatic, Normocephalic. Neck supple. No masses, No LAD. Ears and Nose: No external deformity. CV: RRR, No M/G/R. No JVD.  No thrill. No extra heart sounds. PULM: CTA B, no wheezes, crackles, rhonchi. No retractions. No resp. distress. No accessory muscle use. EXTR: No c/c/e NEURO Normal gait for pt; slow but does not use a cane PSYCH: Normally interactive. Conversant. Not depressed or anxious appearing.  Calm demeanor.  Foot exam; he does have long toenails bilaterally, and mild hammer toes of several of the lesser toes. Normal sensation and perfusion of the foot   Assessment and Plan: Type II or unspecified type diabetes mellitus with unspecified complication, uncontrolled - Plan: POCT glycosylated hemoglobin (Hb A1C), Lipid panel  Immunization due - Plan: Pneumococcal conjugate vaccine 13-valent IM  Hammer toe, acquired, unspecified laterality  With labs- ask him about podiatry   Filled out paperwork for his diabetic shoes He is NOT taking lipitor per his report  Signed Lamar Blinks, MD  Results for orders placed in visit on 09/30/13  LIPID PANEL      Result Value Ref Range   Cholesterol 115  0 - 200 mg/dL   Triglycerides 70  <150 mg/dL   HDL 36 (*) >39 mg/dL   Total CHOL/HDL Ratio 3.2     VLDL 14  0 - 40 mg/dL   LDL Cholesterol 65  0 - 99 mg/dL  POCT GLYCOSYLATED HEMOGLOBIN (HGB A1C)      Result Value Ref Range   Hemoglobin A1C 8.2     Received his labs and called to discuss on 6/25.  His A1c is still a bit higher than we would like but it is stable. He is on metfomin 1000 BID and levemir 50 BID.  He will continue to try and watch his diet and we will recheck in about 3 months.    He would like a podiatry referral

## 2013-10-01 ENCOUNTER — Encounter: Payer: Self-pay | Admitting: Family Medicine

## 2013-10-01 NOTE — Addendum Note (Signed)
Addended by: Lamar Blinks C on: 10/01/2013 07:57 PM   Modules accepted: Orders, Medications

## 2013-10-15 ENCOUNTER — Telehealth: Payer: Self-pay | Admitting: Family Medicine

## 2013-10-15 NOTE — Telephone Encounter (Signed)
Called and spoke with Justin [name].  He is requesting a brace for his left knee.  He has a history of atraumatic knee pain consistent with osteoarthritis.  He had an accident where a forklift fell onto his legs about 3 years ago which accelerated his arthritis.    A knee brace will be helpful in reducing his pain and improving his ability to walk

## 2013-11-02 ENCOUNTER — Other Ambulatory Visit: Payer: Self-pay | Admitting: Family Medicine

## 2013-11-14 ENCOUNTER — Other Ambulatory Visit: Payer: Self-pay | Admitting: Family Medicine

## 2013-11-24 ENCOUNTER — Other Ambulatory Visit: Payer: Self-pay | Admitting: Family Medicine

## 2013-12-10 ENCOUNTER — Ambulatory Visit (INDEPENDENT_AMBULATORY_CARE_PROVIDER_SITE_OTHER): Payer: Medicare HMO | Admitting: Family Medicine

## 2013-12-10 VITALS — BP 144/68 | HR 84 | Temp 98.1°F | Resp 18

## 2013-12-10 DIAGNOSIS — I7090 Unspecified atherosclerosis: Secondary | ICD-10-CM

## 2013-12-10 DIAGNOSIS — I2510 Atherosclerotic heart disease of native coronary artery without angina pectoris: Secondary | ICD-10-CM

## 2013-12-10 DIAGNOSIS — E118 Type 2 diabetes mellitus with unspecified complications: Secondary | ICD-10-CM

## 2013-12-10 DIAGNOSIS — E1165 Type 2 diabetes mellitus with hyperglycemia: Secondary | ICD-10-CM

## 2013-12-10 DIAGNOSIS — Z23 Encounter for immunization: Secondary | ICD-10-CM

## 2013-12-10 DIAGNOSIS — E785 Hyperlipidemia, unspecified: Secondary | ICD-10-CM

## 2013-12-10 DIAGNOSIS — IMO0002 Reserved for concepts with insufficient information to code with codable children: Secondary | ICD-10-CM

## 2013-12-10 LAB — POCT GLYCOSYLATED HEMOGLOBIN (HGB A1C): HEMOGLOBIN A1C: 7.7

## 2013-12-10 MED ORDER — ATORVASTATIN CALCIUM 40 MG PO TABS: ORAL_TABLET | ORAL | Status: DC

## 2013-12-10 MED ORDER — CLOPIDOGREL BISULFATE 75 MG PO TABS: 75.0000 mg | ORAL_TABLET | Freq: Every day | ORAL | Status: DC

## 2013-12-10 NOTE — Patient Instructions (Signed)
Your A1c looks better today!  Please come and see me in about 3 months.  Take care!

## 2013-12-10 NOTE — Progress Notes (Signed)
[facility] [address] [place] [address] [phone]  Date:  12/10/2013   Name:  Justin [name]   DOB:  02/09/1935   MRN:  [id number]  PCP:  Lamar Blinks, MD    Chief Complaint: Medication Refill   History of Present Illness:  Justin [name] is a 79 y.o. very pleasant male patient who presents with the following:  Here today for medication refills.  He is doing well, and his wife is also doing better.  He needs lipitor and plavix which he takes due to history of TIA.  He declines a flu shot today.  He has not had a tetanus shot that he can remember- we will bost this for him today.  He is not sure how his DM is doing, but has felt well so he thinks it is ok.  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 Hammer toe, acquired 09/30/2013  . Personal history of colonic adenomas 04/15/2013  . Chronic pancreatitis 02/26/2013  . Anemia of chronic disease 02/26/2013  . Special screening for malignant neoplasms, colon 02/26/2013  . Essential hypertension, benign 12/14/2012  . Testosterone deficiency 10/22/2011  . Headache above the eye region 02/22/2011  . Type II or unspecified type diabetes mellitus with unspecified complication, uncontrolled 02/22/2011  . TIA on medication 02/22/2011  . Coronary artery disease 02/22/2011    Past Medical History  Diagnosis Date  . HTN (hypertension)   . Diabetes mellitus   . Hypercholesteremia   . TIA (transient ischemic attack)   . Iron deficiency   . Arthritis   . Stroke   . Pancreatitis, acute 06/02/2012    Diagnosed 05/2012   . Personal history of colonic adenomas 04/15/2013    04/15/2013 2 diminutive polyps      Past Surgical History  Procedure Laterality Date  . Back surgery  1985  . Neck surgery  1985  . Temporal artery biopsy / ligation  02/26/2011    left side  . Artery biopsy  02/26/2011    Procedure: BIOPSY  TEMPORAL ARTERY;  Surgeon: Willey Blade, MD;  Location: [facility];  Service: General;  Laterality: Left;  Left temporal artery biospy  . Cholecystectomy    . Colonoscopy    . Upper gastrointestinal endoscopy    . Eus      History  Substance Use Topics  . Smoking status: Former Smoker -- 35 years    Types: Cigarettes    Quit date: 04/09/1990  . Smokeless tobacco: Never Used  . Alcohol Use: No     Comment: "per pt stopped drinking in the 90's"    Family History  Problem Relation Age of Onset  . Heart disease Mother   . Diabetes Sister     x 2  . Heart attack Sister   . Amblyopia Father     No Known Allergies  Medication list has been reviewed and updated.  Current Outpatient Prescriptions on File Prior to Visit  Medication Sig Dispense Refill  . atorvastatin (LIPITOR) 40 MG tablet TAKE ONE TABLET BY MOUTH ONCE DAILY  30 tablet  3  . clopidogrel (PLAVIX) 75 MG tablet Take 1 tablet (75 mg total) by mouth daily.  30 tablet  10  . CREON 12000 UNITS CPEP capsule TAKE TWO CAPSULES BY MOUTH THREE TIMES DAILY WITH MEALS  540 capsule  0  .  fish oil-omega-3 fatty acids 1000 MG capsule Take 2 g by mouth daily.        Marland Kitchen glucose blood (EASY TOUCH TEST) test strip Use to test blood sugar 3 times daily. Dx code: 250.02  100 each  11  . insulin detemir (LEVEMIR) 100 UNIT/ML injection Inject 0.5 mLs (50 Units total) into the skin 2 (two) times daily.  10 mL  11  . Insulin Pen Needle 32G X 4 MM MISC Use as directed to inject insulin tidwc  100 each  11  . l-methylfolate-B6-B12 (METANX) 3-35-2 MG TABS Take 1 tablet by mouth daily.        . Lancets MISC To check blood glucose tid  100 each  5  . LEVEMIR FLEXTOUCH 100 UNIT/ML Pen INJECT 50 UNITS INTO THE SKIN TWICE A DAY  30 mL  5  . meclizine (ANTIVERT) 12.5 MG tablet Take one pill every 8 hours only if needed for dizziness  20 tablet  0  . metFORMIN (GLUCOPHAGE) 1000 MG tablet TAKE ONE TABLET BY MOUTH TWICE DAILY WITH  MEALS  180 tablet  1  . Multiple Vitamins-Minerals (MULTIVITAMIN WITH MINERALS) tablet Take 1 tablet by mouth daily.        Marland Kitchen omeprazole (PRILOSEC) 40 MG capsule TAKE ONE CAPSULE BY MOUTH ONCE DAILY  90 capsule  0  . Pancrelipase, Lip-Prot-Amyl, 10500 UNITS CPEP Take 2 capsules (21,000 Units total) by mouth 3 x daily with food.  540 capsule  6  . Pancrelipase, Lip-Prot-Amyl, 5000 UNITS CPEP Take 1 capsule (5,000 Units total) by mouth daily.  30 capsule  11  . [DISCONTINUED] testosterone cypionate (DEPOTESTOTERONE CYPIONATE) 200 MG/ML injection Inject into the muscle every 14 (fourteen) days.       Current Facility-Administered Medications on File Prior to Visit  Medication Dose Route Frequency Provider Last Rate Last Dose  . testosterone cypionate (DEPOTESTOTERONE CYPIONATE) injection 200 mg  200 mg Intramuscular Q14 Days Robyn Haber, MD   200 mg at 01/20/13 0939  . testosterone cypionate (DEPOTESTOTERONE CYPIONATE) injection 200 mg  200 mg Intramuscular Q14 Days Collene Leyden, PA-C   200 mg at 04/05/13 5956    Review of Systems:  As per HPI- otherwise negative.   Physical Examination: Filed Vitals:   12/10/13 1610  BP: 144/68  Pulse: 84  Temp: 98.1 F (36.7 C)  Resp: 18   There were no vitals filed for this visit. There is no weight on file to calculate BMI. Ideal Body Weight:    GEN: WDWN, NAD, Non-toxic, A & O x 3, overweight, looks well HEENT: Atraumatic, Normocephalic. Neck supple. No masses, No LAD. Ears and Nose: No external deformity. CV: RRR, No M/G/R. No JVD. No thrill. No extra heart sounds. PULM: CTA B, no wheezes, crackles, rhonchi. No retractions. No resp. distress. No accessory muscle use. EXTR: No c/c/e NEURO Normal gait.  PSYCH: Normally interactive. Conversant. Not depressed or anxious appearing.  Calm demeanor.   Results for orders placed in visit on 12/10/13  POCT GLYCOSYLATED HEMOGLOBIN (HGB A1C)      Result Value Ref Range   Hemoglobin A1C 7.7       Assessment and Plan: ASCVD (arteriosclerotic cardiovascular disease) - Plan: clopidogrel (PLAVIX) 75 MG tablet  Other and unspecified hyperlipidemia - Plan: atorvastatin (LIPITOR) 40 MG tablet  Type II or unspecified type diabetes mellitus with unspecified complication, uncontrolled - Plan: Basic metabolic panel, POCT glycosylated hemoglobin (Hb A1C)  DM looks good today- he is pleased.  Refilled other  medications as above and check BMP.   Td booster today Will plan further follow- up pending labs.   Signed Lamar Blinks, MD

## 2013-12-11 ENCOUNTER — Encounter: Payer: Self-pay | Admitting: Family Medicine

## 2013-12-11 LAB — BASIC METABOLIC PANEL
BUN: 16 mg/dL (ref 6–23)
CALCIUM: 9 mg/dL (ref 8.4–10.5)
CO2: 25 mEq/L (ref 19–32)
Chloride: 100 mEq/L (ref 96–112)
Creat: 1.04 mg/dL (ref 0.50–1.35)
GLUCOSE: 270 mg/dL — AB (ref 70–99)
Potassium: 4.4 mEq/L (ref 3.5–5.3)
Sodium: 134 mEq/L — ABNORMAL LOW (ref 135–145)

## 2013-12-31 ENCOUNTER — Ambulatory Visit (INDEPENDENT_AMBULATORY_CARE_PROVIDER_SITE_OTHER): Payer: Medicare HMO

## 2013-12-31 ENCOUNTER — Ambulatory Visit (INDEPENDENT_AMBULATORY_CARE_PROVIDER_SITE_OTHER): Payer: Medicare HMO | Admitting: Family Medicine

## 2013-12-31 VITALS — BP 148/76 | HR 88 | Temp 99.3°F | Resp 16 | Ht 68.0 in | Wt 199.1 lb

## 2013-12-31 DIAGNOSIS — R05 Cough: Secondary | ICD-10-CM

## 2013-12-31 DIAGNOSIS — IMO0002 Reserved for concepts with insufficient information to code with codable children: Secondary | ICD-10-CM

## 2013-12-31 DIAGNOSIS — I7090 Unspecified atherosclerosis: Secondary | ICD-10-CM

## 2013-12-31 DIAGNOSIS — R059 Cough, unspecified: Secondary | ICD-10-CM

## 2013-12-31 DIAGNOSIS — E1165 Type 2 diabetes mellitus with hyperglycemia: Secondary | ICD-10-CM

## 2013-12-31 DIAGNOSIS — I2510 Atherosclerotic heart disease of native coronary artery without angina pectoris: Secondary | ICD-10-CM

## 2013-12-31 DIAGNOSIS — J209 Acute bronchitis, unspecified: Secondary | ICD-10-CM

## 2013-12-31 DIAGNOSIS — J069 Acute upper respiratory infection, unspecified: Secondary | ICD-10-CM

## 2013-12-31 DIAGNOSIS — E118 Type 2 diabetes mellitus with unspecified complications: Secondary | ICD-10-CM

## 2013-12-31 LAB — POCT CBC
GRANULOCYTE PERCENT: 69.2 % (ref 37–80)
HEMATOCRIT: 37 % — AB (ref 43.5–53.7)
HEMOGLOBIN: 11.7 g/dL — AB (ref 14.1–18.1)
Lymph, poc: 2.5 (ref 0.6–3.4)
MCH, POC: 28.3 pg (ref 27–31.2)
MCHC: 31.7 g/dL — AB (ref 31.8–35.4)
MCV: 89.5 fL (ref 80–97)
MID (cbc): 0.8 (ref 0–0.9)
MPV: 8.4 fL (ref 0–99.8)
POC Granulocyte: 7.5 — AB (ref 2–6.9)
POC LYMPH PERCENT: 23.1 %L (ref 10–50)
POC MID %: 7.7 %M (ref 0–12)
Platelet Count, POC: 300 10*3/uL (ref 142–424)
RBC: 4.13 M/uL — AB (ref 4.69–6.13)
RDW, POC: 15.1 %
WBC: 10.9 10*3/uL — AB (ref 4.6–10.2)

## 2013-12-31 IMAGING — CR DG CHEST 2V
2 series · 2 of 2 positions shown · non-contrast
Comparison: [DATE]

CLINICAL DATA: Cough.

EXAM:
CHEST  2 VIEW

[PA]
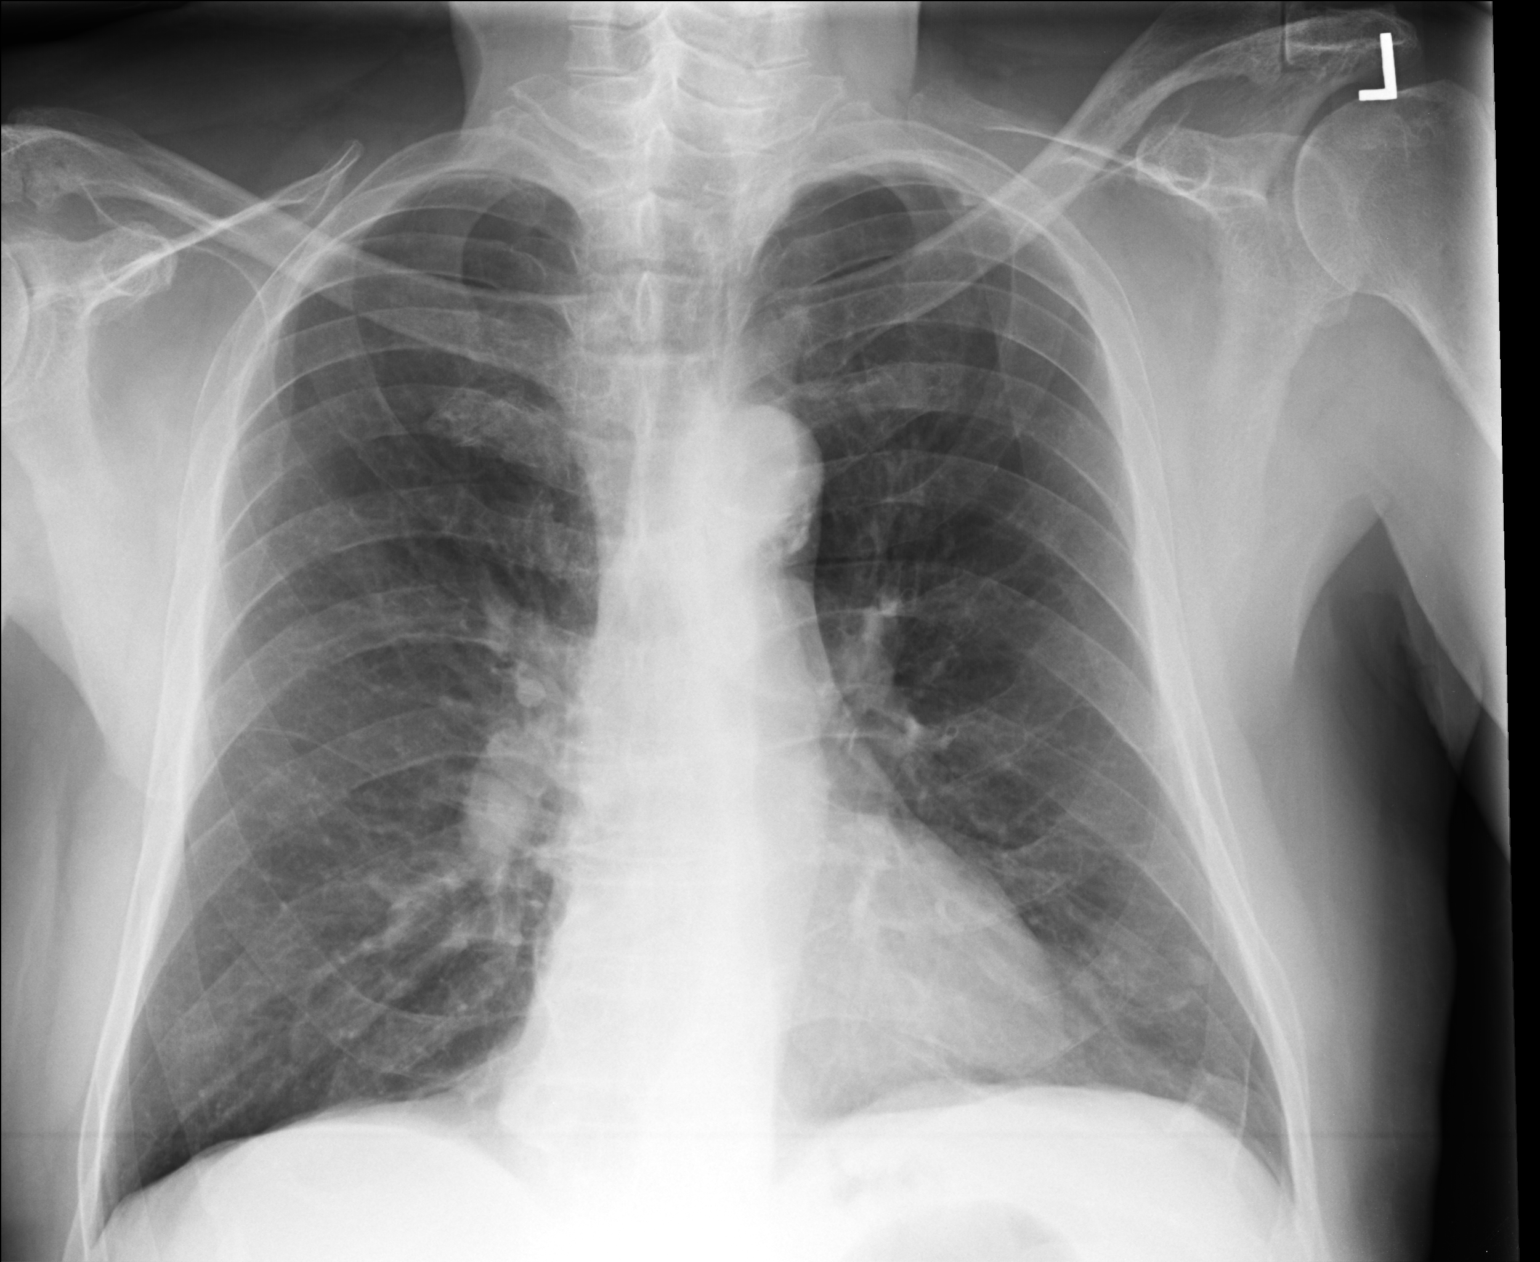

[lateral]
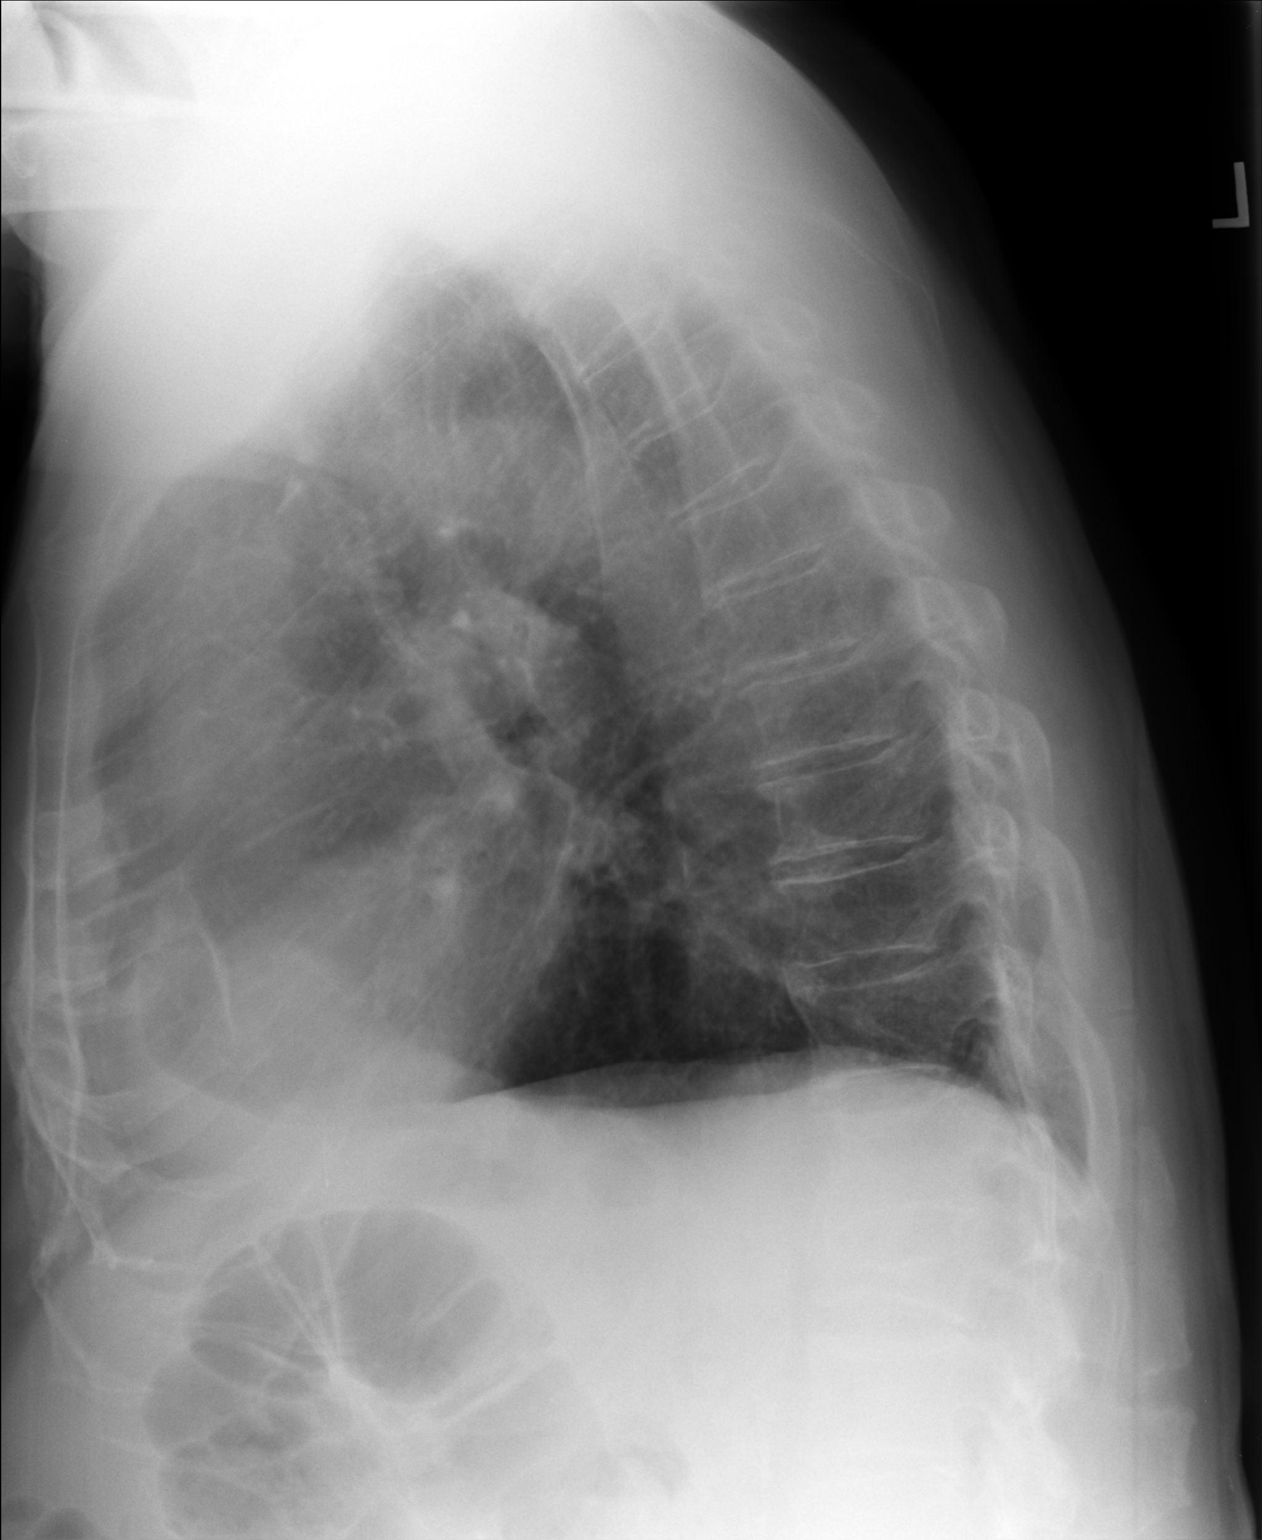

[2 of 2 positions shown; findings below may reference images not displayed]

FINDINGS: Pulmonary hyperinflation. There is no edema, consolidation,
effusion, or pneumothorax. Suspected nipple shadow at the left base.
No nodule seen in this region on [DATE]. No cardiomegaly.
Negative mediastinal contours. Remote L1 compression fracture.
IMPRESSION: Probable COPD.  No acute superimposed findings.

## 2013-12-31 MED ORDER — MUCINEX DM 30-600 MG PO TB12: 1.0000 | ORAL_TABLET | Freq: Two times a day (BID) | ORAL | Status: DC

## 2013-12-31 MED ORDER — AZITHROMYCIN 250 MG PO TABS: ORAL_TABLET | ORAL | Status: DC

## 2013-12-31 NOTE — Patient Instructions (Signed)

## 2013-12-31 NOTE — Progress Notes (Addendum)
Subjective:  This chart was scribed for Justin Forts, MD by Mercy Moore, Medial Scribe. This patient was seen in room 3 and the patient's care was started at 9:10 PM.   Patient ID: Justin [name], male    DOB: 14-Sep-1934, 79 y.o.   MRN: [id number]  12/31/2013  Cough  HPI HPI Comments: Justin [name] is a 79 y.o. male who [facility] complaining of severe cough, ongoing for three days. He reports associated rhinorrhea with profuse clear drainage.  Patient denies treatment with OTC medications at this time.   Denies fever/chills/sweats.  Denies sore throat or ear pain. No headache.  +rhinorrhea; +nasal congestion. +cough; +sputum production.  No SOB. No n/v/d.  No wheezing with this episode.  Previous history of wheezing and received steroid injection.  Wants an injection today as well.   He reports recent sick contacts: his great grandchildren.  Patient is a former smoker; 30 year cessation history.   Recent follow-up with Dr. Lorelei Pont on 12/10/13; HgbA1c of 7.7.   Review of Systems  Constitutional: Negative for fever, chills, diaphoresis, activity change, appetite change and fatigue.  HENT: Positive for congestion, postnasal drip and rhinorrhea. Negative for ear pain, sinus pressure, sore throat and trouble swallowing.   Respiratory: Positive for cough. Negative for shortness of breath, wheezing and stridor.   Cardiovascular: Negative for chest pain.  Gastrointestinal: Negative for nausea, vomiting, abdominal pain and diarrhea.  Skin: Negative for rash.  Neurological: Negative for headaches.    Past Medical History  Diagnosis Date  . HTN (hypertension)   . Diabetes mellitus   . Hypercholesteremia   . TIA (transient ischemic attack)   . Iron deficiency   . Arthritis   . Stroke   . Pancreatitis, acute 06/02/2012    Diagnosed 05/2012   . Personal history of colonic adenomas 04/15/2013    04/15/2013 2 diminutive polyps     Past Surgical History   Procedure Laterality Date  . Back surgery  1985  . Neck surgery  1985  . Temporal artery biopsy / ligation  02/26/2011    left side  . Artery biopsy  02/26/2011    Procedure: BIOPSY TEMPORAL ARTERY;  Surgeon: Willey Blade, MD;  Location: [facility];  Service: General;  Laterality: Left;  Left temporal artery biospy  . Cholecystectomy    . Colonoscopy    . Upper gastrointestinal endoscopy    . Eus     No Known Allergies Current Outpatient Prescriptions  Medication Sig Dispense Refill  . atorvastatin (LIPITOR) 40 MG tablet TAKE ONE TABLET BY MOUTH ONCE DAILY  90 tablet  3  . clopidogrel (PLAVIX) 75 MG tablet Take 1 tablet (75 mg total) by mouth daily.  90 tablet  3  . CREON 12000 UNITS CPEP capsule TAKE TWO CAPSULES BY MOUTH THREE TIMES DAILY WITH MEALS  540 capsule  0  . fish oil-omega-3 fatty acids 1000 MG capsule Take 2 g by mouth daily.        Marland Kitchen glucose blood (EASY TOUCH TEST) test strip Use to test blood sugar 3 times daily. Dx code: 250.02  100 each  11  . insulin detemir (LEVEMIR) 100 UNIT/ML injection Inject 0.5 mLs (50 Units total) into the skin 2 (two) times daily.  10 mL  11  . Insulin Pen Needle 32G X 4 MM MISC Use as directed to inject insulin tidwc  100 each  11  . l-methylfolate-B6-B12 (METANX) 3-35-2 MG  TABS Take 1 tablet by mouth daily.        . Lancets MISC To check blood glucose tid  100 each  5  . LEVEMIR FLEXTOUCH 100 UNIT/ML Pen INJECT 50 UNITS INTO THE SKIN TWICE A DAY  30 mL  5  . meclizine (ANTIVERT) 12.5 MG tablet Take one pill every 8 hours only if needed for dizziness  20 tablet  0  . metFORMIN (GLUCOPHAGE) 1000 MG tablet TAKE ONE TABLET BY MOUTH TWICE DAILY WITH MEALS  180 tablet  1  . Multiple Vitamins-Minerals (MULTIVITAMIN WITH MINERALS) tablet Take 1 tablet by mouth daily.        Marland Kitchen omeprazole (PRILOSEC) 40 MG capsule TAKE ONE CAPSULE BY MOUTH ONCE DAILY  90 capsule  0  . Pancrelipase, Lip-Prot-Amyl, 10500 UNITS CPEP Take 2 capsules  (21,000 Units total) by mouth 3 x daily with food.  540 capsule  6  . Pancrelipase, Lip-Prot-Amyl, 5000 UNITS CPEP Take 1 capsule (5,000 Units total) by mouth daily.  30 capsule  11  . azithromycin (ZITHROMAX) 250 MG tablet Two tablets daily x 1 day then one tablet daily x 4 days  6 tablet  0  . Dextromethorphan-Guaifenesin (MUCINEX DM) 30-600 MG TB12 Take 1 tablet by mouth 2 (two) times daily.  28 each  0  . [DISCONTINUED] testosterone cypionate (DEPOTESTOTERONE CYPIONATE) 200 MG/ML injection Inject into the muscle every 14 (fourteen) days.       Current Facility-Administered Medications  Medication Dose Route Frequency Provider Last Rate Last Dose  . testosterone cypionate (DEPOTESTOTERONE CYPIONATE) injection 200 mg  200 mg Intramuscular Q14 Days Collene Leyden, PA-C   200 mg at 04/05/13 2992       Objective:    BP 148/76  Pulse 88  Temp(Src) 99.3 F (37.4 C) (Oral)  Resp 16  Ht 5\' 8"  (1.727 m)  Wt 199 lb 2 oz (90.323 kg)  BMI 30.28 kg/m2  SpO2 97%  Physical Exam  Nursing note and vitals reviewed. Constitutional: He is oriented to person, place, and time. He appears well-developed and well-nourished. No distress.  HENT:  Head: Normocephalic and atraumatic.  Right Ear: External ear normal.  Left Ear: External ear normal.  Nose: Rhinorrhea present. Right sinus exhibits no maxillary sinus tenderness and no frontal sinus tenderness. Left sinus exhibits no maxillary sinus tenderness and no frontal sinus tenderness.  Mouth/Throat: Oropharynx is clear and moist. No oropharyngeal exudate.  Drainage in the back of his throat.  Eyes: EOM are normal.  Neck: Neck supple. No tracheal deviation present.  Cardiovascular: Normal rate, regular rhythm and normal heart sounds.  Exam reveals no gallop and no friction rub.   No murmur heard. Pulmonary/Chest: Effort normal and breath sounds normal. No respiratory distress. He has no wheezes. He has no rales.  Musculoskeletal: Normal range of  motion.  Lymphadenopathy:    He has no cervical adenopathy.  Neurological: He is alert and oriented to person, place, and time.  Skin: Skin is warm and dry. No rash noted. He is not diaphoretic.  Psychiatric: He has a normal mood and affect. His behavior is normal.   Results for orders placed in visit on 12/31/13  POCT CBC      Result Value Ref Range   WBC 10.9 (*) 4.6 - 10.2 K/uL   Lymph, poc 2.5  0.6 - 3.4   POC LYMPH PERCENT 23.1  10 - 50 %L   MID (cbc) 0.8  0 - 0.9   POC MID %  7.7  0 - 12 %M   POC Granulocyte 7.5 (*) 2 - 6.9   Granulocyte percent 69.2  37 - 80 %G   RBC 4.13 (*) 4.69 - 6.13 M/uL   Hemoglobin 11.7 (*) 14.1 - 18.1 g/dL   HCT, POC 37.0 (*) 43.5 - 53.7 %   MCV 89.5  80 - 97 fL   MCH, POC 28.3  27 - 31.2 pg   MCHC 31.7 (*) 31.8 - 35.4 g/dL   RDW, POC 15.1     Platelet Count, POC 300  142 - 424 K/uL   MPV 8.4  0 - 99.8 fL   [facility] reading (PRIMARY) by  Dr. Tamala Julian. CXR: NAD.     Assessment & Plan:   1. Cough   2. Acute upper respiratory infections of unspecified site   3. Acute bronchitis, unspecified organism   4. Type II or unspecified type diabetes mellitus with unspecified complication, uncontrolled     1. Acute bronchitis:  New.  Rx for Zpack provided; also rx for Mucinex DM provided to use bid.  RTC for SOB, wheezing, no improvement.  Recommend rest, fluids. 2. DMII: stable; recent HgbA1c on 9/9 of 7.7; monitor sugars closely with acute illness.  Meds ordered this encounter  Medications  . azithromycin (ZITHROMAX) 250 MG tablet    Sig: Two tablets daily x 1 day then one tablet daily x 4 days    Dispense:  6 tablet    Refill:  0  . Dextromethorphan-Guaifenesin (MUCINEX DM) 30-600 MG TB12    Sig: Take 1 tablet by mouth 2 (two) times daily.    Dispense:  28 each    Refill:  0    Return if symptoms worsen or fail to improve.   I personally performed the services described in this documentation, which was scribed in my presence. The recorded  information has been reviewed and is accurate.  Justin [name], M.D.  [facility] [address] [place], [place]  [address] [phone] phone [phone] fax

## 2014-01-06 ENCOUNTER — Other Ambulatory Visit: Payer: Self-pay

## 2014-01-06 MED ORDER — METFORMIN HCL 1000 MG PO TABS: ORAL_TABLET | ORAL | Status: DC

## 2014-01-06 MED ORDER — INSULIN DETEMIR 100 UNIT/ML FLEXPEN: PEN_INJECTOR | SUBCUTANEOUS | Status: DC

## 2014-01-14 ENCOUNTER — Other Ambulatory Visit: Payer: Self-pay | Admitting: Physician Assistant

## 2014-03-18 ENCOUNTER — Telehealth: Payer: Self-pay | Admitting: Family Medicine

## 2014-03-18 NOTE — Telephone Encounter (Signed)
Left a message for patient to come in and get flu shot or call with information about his flu shot

## 2014-03-26 ENCOUNTER — Other Ambulatory Visit: Payer: Self-pay | Admitting: Physician Assistant

## 2014-07-12 ENCOUNTER — Other Ambulatory Visit: Payer: Self-pay | Admitting: Family Medicine

## 2014-08-12 ENCOUNTER — Ambulatory Visit (INDEPENDENT_AMBULATORY_CARE_PROVIDER_SITE_OTHER): Payer: Medicare HMO | Admitting: Physician Assistant

## 2014-08-12 VITALS — BP 140/70 | HR 78 | Temp 98.3°F | Resp 12 | Ht 68.0 in | Wt 199.0 lb

## 2014-08-12 DIAGNOSIS — K861 Other chronic pancreatitis: Secondary | ICD-10-CM | POA: Diagnosis not present

## 2014-08-12 DIAGNOSIS — E1065 Type 1 diabetes mellitus with hyperglycemia: Secondary | ICD-10-CM

## 2014-08-12 DIAGNOSIS — Z794 Long term (current) use of insulin: Principal | ICD-10-CM

## 2014-08-12 DIAGNOSIS — IMO0001 Reserved for inherently not codable concepts without codable children: Secondary | ICD-10-CM

## 2014-08-12 DIAGNOSIS — E1165 Type 2 diabetes mellitus with hyperglycemia: Principal | ICD-10-CM

## 2014-08-12 LAB — CBC
HCT: 33.5 % — ABNORMAL LOW (ref 39.0–52.0)
Hemoglobin: 11.5 g/dL — ABNORMAL LOW (ref 13.0–17.0)
MCH: 28.8 pg (ref 26.0–34.0)
MCHC: 34.3 g/dL (ref 30.0–36.0)
MCV: 83.8 fL (ref 78.0–100.0)
MPV: 10.7 fL (ref 8.6–12.4)
Platelets: 325 10*3/uL (ref 150–400)
RBC: 4 MIL/uL — ABNORMAL LOW (ref 4.22–5.81)
RDW: 14.1 % (ref 11.5–15.5)
WBC: 8.6 10*3/uL (ref 4.0–10.5)

## 2014-08-12 LAB — BASIC METABOLIC PANEL
BUN: 18 mg/dL (ref 6–23)
CALCIUM: 9.1 mg/dL (ref 8.4–10.5)
CHLORIDE: 102 meq/L (ref 96–112)
CO2: 25 mEq/L (ref 19–32)
Creat: 1.02 mg/dL (ref 0.50–1.35)
Glucose, Bld: 84 mg/dL (ref 70–99)
Potassium: 4.7 mEq/L (ref 3.5–5.3)
Sodium: 136 mEq/L (ref 135–145)

## 2014-08-12 LAB — LIPID PANEL
CHOL/HDL RATIO: 2.8 ratio
Cholesterol: 111 mg/dL (ref 0–200)
HDL: 39 mg/dL — AB (ref >=40)
LDL CALC: 60 mg/dL (ref 0–99)
TRIGLYCERIDES: 59 mg/dL (ref <150)
VLDL: 12 mg/dL (ref 0–40)

## 2014-08-12 LAB — MICROALBUMIN, URINE: Microalb, Ur: 71.5 mg/dL — ABNORMAL HIGH (ref <2.0)

## 2014-08-12 LAB — POCT GLYCOSYLATED HEMOGLOBIN (HGB A1C): HEMOGLOBIN A1C: 8.2

## 2014-08-12 LAB — GLUCOSE, POCT (MANUAL RESULT ENTRY): POC GLUCOSE: 102 mg/dL — AB (ref 70–99)

## 2014-08-12 MED ORDER — LANCETS MISC: Status: DC

## 2014-08-12 MED ORDER — INSULIN DETEMIR 100 UNIT/ML FLEXPEN: 50.0000 [IU] | PEN_INJECTOR | Freq: Two times a day (BID) | SUBCUTANEOUS | Status: DC

## 2014-08-12 MED ORDER — ATORVASTATIN CALCIUM 40 MG PO TABS: 40.0000 mg | ORAL_TABLET | Freq: Every day | ORAL | Status: DC

## 2014-08-12 NOTE — Patient Instructions (Signed)
Please keep the insulin pens that still have insulin.  I will contact this pharmacy. Please await contact for the foot specialist. Diabetes Mellitus and Food It is important for you to manage your blood sugar (glucose) level. Your blood glucose level can be greatly affected by what you eat. Eating healthier foods in the appropriate amounts throughout the day at about the same time each day will help you control your blood glucose level. It can also help slow or prevent worsening of your diabetes mellitus. Healthy eating may even help you improve the level of your blood pressure and reach or maintain a healthy weight.  HOW CAN FOOD AFFECT ME? Carbohydrates Carbohydrates affect your blood glucose level more than any other type of food. Your dietitian will help you determine how many carbohydrates to eat at each meal and teach you how to count carbohydrates. Counting carbohydrates is important to keep your blood glucose at a healthy level, especially if you are using insulin or taking certain medicines for diabetes mellitus. Alcohol Alcohol can cause sudden decreases in blood glucose (hypoglycemia), especially if you use insulin or take certain medicines for diabetes mellitus. Hypoglycemia can be a life-threatening condition. Symptoms of hypoglycemia (sleepiness, dizziness, and disorientation) are similar to symptoms of having too much alcohol.  If your health care provider has given you approval to drink alcohol, do so in moderation and use the following guidelines:  Women should not have more than one drink per day, and men should not have more than two drinks per day. One drink is equal to:  12 oz of beer.  5 oz of wine.  1 oz of hard liquor.  Do not drink on an empty stomach.  Keep yourself hydrated. Have water, diet soda, or unsweetened iced tea.  Regular soda, juice, and other mixers might contain a lot of carbohydrates and should be counted. WHAT FOODS ARE NOT RECOMMENDED? As you make  food choices, it is important to remember that all foods are not the same. Some foods have fewer nutrients per serving than other foods, even though they might have the same number of calories or carbohydrates. It is difficult to get your body what it needs when you eat foods with fewer nutrients. Examples of foods that you should avoid that are high in calories and carbohydrates but low in nutrients include:  Trans fats (most processed foods list trans fats on the Nutrition Facts label).  Regular soda.  Juice.  Candy.  Sweets, such as cake, pie, doughnuts, and cookies.  Fried foods. WHAT FOODS CAN I EAT? Have nutrient-rich foods, which will nourish your body and keep you healthy. The food you should eat also will depend on several factors, including:  The calories you need.  The medicines you take.  Your weight.  Your blood glucose level.  Your blood pressure level.  Your cholesterol level. You also should eat a variety of foods, including:  Protein, such as meat, poultry, fish, tofu, nuts, and seeds (lean animal proteins are best).  Fruits.  Vegetables.  Dairy products, such as milk, cheese, and yogurt (low fat is best).  Breads, grains, pasta, cereal, rice, and beans.  Fats such as olive oil, trans fat-free margarine, canola oil, avocado, and olives. DOES EVERYONE WITH DIABETES MELLITUS HAVE THE SAME MEAL PLAN? Because every person with diabetes mellitus is different, there is not one meal plan that works for everyone. It is very important that you meet with a dietitian who will help you create a meal plan  that is just right for you. Document Released: 12/21/2004 Document Revised: 03/31/2013 Document Reviewed: 02/20/2013 [employer] Patient Information 2015 [place], [place]. This information is not intended to replace advice given to you by your health care provider. Make sure you discuss any questions you have with your health care provider.

## 2014-08-12 NOTE — Progress Notes (Signed)
[facility] [address], [place] [address] [phone]  Date:  08/12/2014   Name:  Justin [name]   DOB:  1934/06/28   MRN:  [id number]  PCP:  Lamar Blinks, MD    Chief Complaint: Medication Refill   History of Present Illness:  Justin [name] is a 80 y.o. very pleasant male patient who presents with the following:  Patient is here today for diabetes mellitus recheck and refill.  He states that he checks his blood glucose daily.  The glucose is around 100   Diet: Hamburger, green beans.  He seldom eats potatoes or bread.  He does have a weakness for blueberry muffins and has a banana once per day  He tolerates the medication well.  He reports no hypoglycemic readings.  He has no nausea, vomiting, dizziness, vision changes, tremulousness, or fatigue.   He also reports that 4 of his levemir pens did not release all of the insulin. When he spoke to pharmacy, he was told to get samples from here, his PCP.    Last time eye doctor 6-8 months ago.  Left eye with cataract that was removed at [facility].   Patient Active Problem List   Diagnosis Date Noted  . Hammer toe, acquired 09/30/2013  . Personal history of colonic adenomas 04/15/2013  . Chronic pancreatitis 02/26/2013  . Anemia of chronic disease 02/26/2013  . Special screening for malignant neoplasms, colon 02/26/2013  . Essential hypertension, benign 12/14/2012  . Testosterone deficiency 10/22/2011  . Headache above the eye region 02/22/2011  . Type II or unspecified type diabetes mellitus with unspecified complication, uncontrolled 02/22/2011  . TIA on medication 02/22/2011  . Coronary artery disease 02/22/2011    Past Medical History  Diagnosis Date  . HTN (hypertension)   . Diabetes mellitus   . Hypercholesteremia   . TIA (transient ischemic attack)   . Iron deficiency   . Arthritis   . Stroke   . Pancreatitis, acute 06/02/2012    Diagnosed 05/2012   . Personal history of  colonic adenomas 04/15/2013    04/15/2013 2 diminutive polyps      Past Surgical History  Procedure Laterality Date  . Back surgery  1985  . Neck surgery  1985  . Temporal artery biopsy / ligation  02/26/2011    left side  . Artery biopsy  02/26/2011    Procedure: BIOPSY TEMPORAL ARTERY;  Surgeon: Willey Blade, MD;  Location: [facility];  Service: General;  Laterality: Left;  Left temporal artery biospy  . Cholecystectomy    . Colonoscopy    . Upper gastrointestinal endoscopy    . Eus      History  Substance Use Topics  . Smoking status: Former Smoker -- 35 years    Types: Cigarettes    Quit date: 04/09/1990  . Smokeless tobacco: Never Used  . Alcohol Use: No     Comment: "per pt stopped drinking in the 90's"    Family History  Problem Relation Age of Onset  . Heart disease Mother   . Diabetes Sister     x 2  . Heart attack Sister   . Amblyopia Father     No Known Allergies  Medication list has been reviewed and updated.  Current Outpatient Prescriptions on File Prior to Visit  Medication Sig Dispense Refill  . clopidogrel (PLAVIX) 75 MG tablet Take 1 tablet (75 mg total) by mouth daily. 90 tablet 3  .  CREON 12000 UNITS CPEP capsule TAKE TWO CAPSULES BY MOUTH THREE TIMES DAILY WITH MEALS 540 capsule 0  . fish oil-omega-3 fatty acids 1000 MG capsule Take 2 g by mouth daily.      Marland Kitchen glucose blood (EASY TOUCH TEST) test strip Use to test blood sugar 3 times daily. Dx code: 250.02 100 each 11  . Insulin Detemir (LEVEMIR FLEXTOUCH) 100 UNIT/ML Pen Inject 50 Units into the skin 2 (two) times daily. PATIENT NEEDS OFFICE VISIT FOR ADDITIONAL REFILLS 30 mL 0  . Insulin Pen Needle 32G X 4 MM MISC Use as directed to inject insulin tidwc 100 each 11  . l-methylfolate-B6-B12 (METANX) 3-35-2 MG TABS Take 1 tablet by mouth daily.      . Lancets MISC To check blood glucose tid 100 each 5  . metFORMIN (GLUCOPHAGE) 1000 MG tablet Take 1 tablet (1,000 mg total) by  mouth 2 (two) times daily with a meal. PATIENT NEEDS OFFICE VISIT FOR ADDITIONAL REFILLS 60 tablet 0  . Multiple Vitamins-Minerals (MULTIVITAMIN WITH MINERALS) tablet Take 1 tablet by mouth daily.      Marland Kitchen omeprazole (PRILOSEC) 40 MG capsule TAKE ONE CAPSULE BY MOUTH ONCE DAILY.  "OFFICE VISIT NEEDED FOR ADDITIONAL REFILLS" 30 capsule 0  . atorvastatin (LIPITOR) 40 MG tablet TAKE ONE TABLET BY MOUTH ONCE DAILY (Patient not taking: Reported on 08/12/2014) 90 tablet 3  . meclizine (ANTIVERT) 12.5 MG tablet Take one pill every 8 hours only if needed for dizziness (Patient not taking: Reported on 08/12/2014) 20 tablet 0  . Pancrelipase, Lip-Prot-Amyl, 10500 UNITS CPEP Take 2 capsules (21,000 Units total) by mouth 3 x daily with food. (Patient not taking: Reported on 08/12/2014) 540 capsule 6  . Pancrelipase, Lip-Prot-Amyl, 5000 UNITS CPEP Take 1 capsule (5,000 Units total) by mouth daily. (Patient not taking: Reported on 08/12/2014) 30 capsule 11  . [DISCONTINUED] testosterone cypionate (DEPOTESTOTERONE CYPIONATE) 200 MG/ML injection Inject into the muscle every 14 (fourteen) days.     Current Facility-Administered Medications on File Prior to Visit  Medication Dose Route Frequency Provider Last Rate Last Dose  . testosterone cypionate (DEPOTESTOTERONE CYPIONATE) injection 200 mg  200 mg Intramuscular Q14 Days Justin Leyden, PA-C   200 mg at 04/05/13 3009    Review of Systems: ROS otherwise unremarkable unless listed above.    Physical Examination: Filed Vitals:   08/12/14 0818  BP: 140/70  Pulse: 78  Temp: 98.3 F (36.8 C)  Resp: 12   Filed Vitals:   08/12/14 0818  Height: 5\' 8"  (1.727 m)  Weight: 199 lb (90.266 kg)   Body mass index is 30.26 kg/(m^2). Ideal Body Weight: Weight in (lb) to have BMI = 25: 164.1 Physical Exam  Constitutional: He is oriented to person, place, and time. He appears well-developed and well-nourished. No distress.  HENT:  Head: Normocephalic and atraumatic.  Right  Ear: External ear normal.  Left Ear: External ear normal.  Nose: Nose normal.  Mouth/Throat: Oropharynx is clear and moist. No oropharyngeal exudate.  Eyes: Pupils are equal, round, and reactive to light.  Neck: Normal range of motion. No tracheal deviation present. No thyromegaly present.  Cardiovascular: Normal rate, regular rhythm and intact distal pulses.  Exam reveals no friction rub.   No murmur heard. Pulmonary/Chest: Effort normal and breath sounds normal. No respiratory distress. He has no wheezes. He has no rales.  Abdominal: Soft. Bowel sounds are normal. He exhibits no distension and no mass. There is no tenderness.  Musculoskeletal: Normal range of motion.  He exhibits no edema or tenderness.  Feet:  Right Foot:  Protective Sensation: 5 sites tested.5 sites sensed. Skin Integrity: Negative for ulcer, blister, skin breakdown, erythema or warmth.  Left Foot:  Protective Sensation: 5 sites tested. 5 sites sensed. Skin Integrity: Negative for ulcer, blister, skin breakdown, erythema or warmth.  Lymphadenopathy:    He has no cervical adenopathy.  Neurological: He is alert and oriented to person, place, and time. No cranial nerve deficit. Coordination normal.  Skin: Skin is warm and dry. No rash noted. He is not diaphoretic. No erythema.  Feet without ulceration, swelling, or loss of sensation.  Monofilament foot exam normal and intact. Nails are overgrown and thick.     Psychiatric: He has a normal mood and affect. His behavior is normal.    Results for orders placed or performed in visit on 08/12/14  POCT glucose (manual entry)  Result Value Ref Range   POC Glucose 102 (A) 70 - 99 mg/dl  POCT glycosylated hemoglobin (Hb A1C)  Result Value Ref Range   Hemoglobin A1C 8.2      Assessment and Plan: 80 year old male is here today for recheck and refill of his diabetes mellitus.  I am placing labs of kidney function.   -Refilling the levemir.  I will contact his mailing  prescription company about these defective insulin pens, for possible reimbursement.   -I will recheck his lipase.  Diabetes mellitus, insulin dependent (IDDM), uncontrolled - Plan: POCT glucose (manual entry), Microalbumin, urine, Basic metabolic panel, CBC, POCT glycosylated hemoglobin (Hb A1C), Lipid panel, Lancets MISC, Insulin Detemir (LEVEMIR FLEXTOUCH) 100 UNIT/ML Pen, atorvastatin (LIPITOR) 40 MG tablet  Chronic pancreatitis, unspecified pancreatitis type - Plan: Lipase  Ivar Drape, PA-C [facility] 5/9/20167:51 PM

## 2014-10-08 ENCOUNTER — Other Ambulatory Visit: Payer: Self-pay | Admitting: Physician Assistant

## 2014-10-08 NOTE — Telephone Encounter (Signed)
Pt was here recently but did not discuss this medication. Please advise on refill.

## 2014-11-16 ENCOUNTER — Other Ambulatory Visit: Payer: Self-pay | Admitting: Family Medicine

## 2014-11-18 ENCOUNTER — Other Ambulatory Visit: Payer: Self-pay | Admitting: Physician Assistant

## 2014-11-23 ENCOUNTER — Other Ambulatory Visit: Payer: Self-pay | Admitting: Family Medicine

## 2014-11-24 ENCOUNTER — Other Ambulatory Visit: Payer: Self-pay | Admitting: Family Medicine

## 2015-02-11 ENCOUNTER — Other Ambulatory Visit: Payer: Self-pay | Admitting: Family Medicine

## 2015-02-15 NOTE — Telephone Encounter (Signed)
Justin [name] you saw patient 08/12/14 did not see Prilosec, or Plavix can we refill?

## 2015-04-29 ENCOUNTER — Encounter: Payer: Self-pay | Admitting: Family Medicine

## 2015-05-04 ENCOUNTER — Encounter: Payer: Self-pay | Admitting: Family Medicine

## 2015-05-16 ENCOUNTER — Other Ambulatory Visit: Payer: Self-pay | Admitting: Family Medicine

## 2015-05-19 ENCOUNTER — Telehealth: Payer: Self-pay

## 2015-05-19 NOTE — Telephone Encounter (Signed)
PT is in need of refill/// last OV 08/12/2014   Original Order:  Insulin Detemir (LEVEMIR FLEXTOUCH) 100 UNIT/ML Pen [id number]        Pharmacy:  [facility] [place], [place]          [phone] Please call to inform if OV will be required prior to fill

## 2015-05-20 ENCOUNTER — Ambulatory Visit (INDEPENDENT_AMBULATORY_CARE_PROVIDER_SITE_OTHER): Payer: Medicare HMO | Admitting: Family Medicine

## 2015-05-20 VITALS — BP 126/60 | HR 90 | Temp 98.4°F | Resp 18 | Ht 68.0 in | Wt 194.4 lb

## 2015-05-20 DIAGNOSIS — IMO0001 Reserved for inherently not codable concepts without codable children: Secondary | ICD-10-CM

## 2015-05-20 DIAGNOSIS — E109 Type 1 diabetes mellitus without complications: Secondary | ICD-10-CM

## 2015-05-20 DIAGNOSIS — E039 Hypothyroidism, unspecified: Secondary | ICD-10-CM | POA: Diagnosis not present

## 2015-05-20 DIAGNOSIS — K861 Other chronic pancreatitis: Secondary | ICD-10-CM

## 2015-05-20 DIAGNOSIS — E1065 Type 1 diabetes mellitus with hyperglycemia: Principal | ICD-10-CM

## 2015-05-20 LAB — COMPLETE METABOLIC PANEL WITH GFR
ALT: 12 U/L (ref 9–46)
AST: 17 U/L (ref 10–35)
Albumin: 3.9 g/dL (ref 3.6–5.1)
Alkaline Phosphatase: 75 U/L (ref 40–115)
BUN: 18 mg/dL (ref 7–25)
CO2: 29 mmol/L (ref 20–31)
Calcium: 9 mg/dL (ref 8.6–10.3)
Chloride: 99 mmol/L (ref 98–110)
Creat: 1.04 mg/dL (ref 0.70–1.18)
GFR, Est African American: 79 mL/min (ref >=60)
GFR, Est Non African American: 68 mL/min (ref >=60)
Glucose, Bld: 85 mg/dL (ref 65–99)
Potassium: 4.9 mmol/L (ref 3.5–5.3)
Sodium: 134 mmol/L — ABNORMAL LOW (ref 135–146)
Total Bilirubin: 0.7 mg/dL (ref 0.2–1.2)
Total Protein: 7.9 g/dL (ref 6.1–8.1)

## 2015-05-20 LAB — LIPID PANEL
Cholesterol: 114 mg/dL — ABNORMAL LOW (ref 125–200)
HDL: 43 mg/dL (ref >=40)
LDL Cholesterol: 56 mg/dL (ref <130)
Total CHOL/HDL Ratio: 2.7 Ratio (ref <=5.0)
Triglycerides: 74 mg/dL (ref <150)
VLDL: 15 mg/dL (ref <30)

## 2015-05-20 LAB — MICROALBUMIN, URINE: Microalb, Ur: 63.4 mg/dL

## 2015-05-20 LAB — POCT GLYCOSYLATED HEMOGLOBIN (HGB A1C): Hemoglobin A1C: 8.6

## 2015-05-20 NOTE — Telephone Encounter (Signed)
Pt came into 102 and was advised that he does need to be seen, overdue for f/up. Pt stayed for OV.

## 2015-05-20 NOTE — Progress Notes (Signed)
@[email or web address] @  By signing my name below, I, Raven Small, attest that this documentation has been prepared under the direction and in the presence of Robyn Haber, MD.  Electronically Signed: Thea Alken, ED Scribe. 05/20/2015. 11:17 AM.  Patient ID: Justin [name] MRN: [id number], DOB: 10/25/1934, 81 y.o. Date of Encounter: 05/20/2015, 11:13 AM  Primary Physician: Lamar Blinks, MD  Chief Complaint:  Chief Complaint  Patient presents with  . Medication Refill    levemir    HPI: 81 y.o. year old male with history below presents for a medication refill. Pt denies complaints or concerns with medication . He is doing well. His sugars have been doing well. Last blood work was 08/2014.  He does note having a tingling sensation in left neck. He denies decreased ROM and neck pain. No difficulty swallowing.   Pt is not working.   Past Medical History  Diagnosis Date  . HTN (hypertension)   . Diabetes mellitus   . Hypercholesteremia   . TIA (transient ischemic attack)   . Iron deficiency   . Arthritis   . Stroke ([place])   . Pancreatitis, acute 06/02/2012    Diagnosed 05/2012   . Personal history of colonic adenomas 04/15/2013    04/15/2013 2 diminutive polyps       Home Meds: Prior to Admission medications   Medication Sig Start Date End Date Taking? Authorizing Provider  atorvastatin (LIPITOR) 40 MG tablet Take 1 tablet (40 mg total) by mouth daily. 08/12/14  Yes Dorian Heckle English, PA  clopidogrel (PLAVIX) 75 MG tablet TAKE 1 TABLET EVERY DAY 02/21/15  Yes Stephanie D English, PA  CREON 12000 UNITS CPEP capsule TAKE TWO CAPSULES BY MOUTH THREE TIMES DAILY WITH MEALS   Yes Shawnee Knapp, MD  fish oil-omega-3 fatty acids 1000 MG capsule Take 2 g by mouth daily.     Yes Historical Provider, MD  glucose blood (EASY TOUCH TEST) test strip Use to test blood sugar 3 times daily. Dx code: 250.02 02/11/13  Yes Theda Sers, PA-C  Insulin Pen Needle 32G X 4 MM MISC Use as directed to inject insulin  tidwc 12/14/12  Yes Shawnee Knapp, MD  l-methylfolate-B6-B12 [facility] 3-35-2 MG TABS Take 1 tablet by mouth daily.     Yes Historical Provider, MD  Lancets MISC To check blood glucose tid 08/12/14  Yes Dorian Heckle English, PA  LEVEMIR FLEXTOUCH 100 UNIT/ML Pen INJECT  50 UNITS SUBCUTANEOUSLY TWICE DAILY 11/25/14  Yes Gay Filler Copland, MD  metFORMIN (GLUCOPHAGE) 1000 MG tablet TAKE 1 TABLET TWICE DAILY WITH MEALS 05/18/15  Yes Gay Filler Copland, MD  Multiple Vitamins-Minerals (MULTIVITAMIN WITH MINERALS) tablet Take 1 tablet by mouth daily.     Yes Historical Provider, MD  omeprazole (PRILOSEC) 40 MG capsule TAKE 1 CAPSULE EVERY DAY 02/21/15  Yes Dorian Heckle English, PA    Allergies: No Known Allergies  Social History   Social History  . Marital Status: Married    Spouse Name: N/A  . Number of Children: 6  . Years of Education: N/A   Occupational History  . Not on file.   Social History Main Topics  . Smoking status: Former Smoker -- 35 years    Types: Cigarettes    Quit date: 04/09/1990  . Smokeless tobacco: Never Used  . Alcohol Use: No     Comment: "per pt stopped drinking in the 90's"  . Drug Use: No  . Sexual Activity: Not on file   Other Topics Concern  .  Not on file   Social History Narrative     Review of Systems: Constitutional: negative for chills, fever, night sweats, weight changes, or fatigue  HEENT: negative for vision changes, hearing loss, congestion, rhinorrhea, ST, epistaxis, or sinus pressure Cardiovascular: negative for chest pain or palpitations Respiratory: negative for hemoptysis, wheezing, shortness of breath, or cough Abdominal: negative for abdominal pain, nausea, vomiting, diarrhea, or constipation Dermatological: negative for rash Neurologic: negative for headache, dizziness, or syncope All other systems reviewed and are otherwise negative with the exception to those above and in the HPI.   Physical Exam: Blood pressure 126/60, pulse 90, temperature  98.4 F (36.9 C), temperature source Oral, resp. rate 18, height 5\' 8"  (1.727 m), weight 194 lb 6.4 oz (88.179 kg), SpO2 96 %., Body mass index is 29.57 kg/(m^2). General: Well developed, well nourished, in no acute distress. Head: Normocephalic, atraumatic, eyes without discharge, sclera non-icteric, nares are without discharge. Oral cavity moist, posterior pharynx without exudate, erythema, peritonsillar abscess, or post nasal drip.  Neck: Supple. No thyromegaly. Full ROM. No lymphadenopathy. Palpation on neck is normal  Lungs: Clear bilaterally to auscultation without wheezes, rales, or rhonchi. Breathing is unlabored. Heart: RRR with S1 S2. No murmurs. He has an S4 gallop Msk:  Strength and tone normal for age. Extremities/Skin: Warm and dry. No clubbing or cyanosis. No edema. No rashes or suspicious lesions. Neuro: Alert and oriented X 3. Moves all extremities spontaneously. Gait is normal. CNII-XII grossly in tact. Psych:  Responds to questions appropriately with a normal affect.   Labs:   ASSESSMENT AND PLAN:  81 y.o. year old male with  This chart was scribed in my presence and reviewed by me personally.    ICD-9-CM ICD-10-CM   1. Uncontrolled type 1 diabetes mellitus without complication ([name]) Q000111Q E10.9 POCT glycosylated hemoglobin (Hb A1C)     Microalbumin, urine     COMPLETE METABOLIC PANEL WITH GFR  2. Chronic pancreatitis, unspecified pancreatitis type ([place]) 577.1 K86.1 Lipid panel  3. Hypothyroidism, unspecified hypothyroidism type 244.9 E03.9 TSH     Signed, Robyn Haber, MD    Signed, Robyn Haber, MD 05/20/2015 11:13 AM

## 2015-05-21 LAB — TSH: TSH: 2.5 mIU/L (ref 0.40–4.50)

## 2015-05-25 ENCOUNTER — Encounter: Payer: Self-pay | Admitting: *Deleted

## 2015-06-07 ENCOUNTER — Telehealth: Payer: Self-pay

## 2015-06-07 NOTE — Telephone Encounter (Signed)
PT CAME BY TO LET  Dr Joseph Art know that he found a doctor for his legs,  DR Serita Grit [address] [place]   Dr telephone [phone]

## 2015-06-08 NOTE — Telephone Encounter (Signed)
Please make sure patient does not need a referral

## 2015-06-29 ENCOUNTER — Ambulatory Visit (INDEPENDENT_AMBULATORY_CARE_PROVIDER_SITE_OTHER): Payer: Medicare HMO | Admitting: Physician Assistant

## 2015-06-29 ENCOUNTER — Other Ambulatory Visit: Payer: Self-pay | Admitting: Family Medicine

## 2015-06-29 VITALS — BP 122/70 | HR 113 | Temp 98.1°F | Resp 16 | Ht 68.0 in | Wt 189.6 lb

## 2015-06-29 DIAGNOSIS — M2550 Pain in unspecified joint: Secondary | ICD-10-CM | POA: Diagnosis not present

## 2015-06-29 DIAGNOSIS — J309 Allergic rhinitis, unspecified: Secondary | ICD-10-CM | POA: Diagnosis not present

## 2015-06-29 NOTE — Progress Notes (Signed)
[facility] [address] [place] [address] [phone]  Date:  06/29/2015   Name:  Justin [name]   DOB:  [birth year]   MRN:  [id number]  PCP:  Lamar Blinks, MD    Chief Complaint: Immunizations   History of Present Illness:  This is a 81 y.o. male with PMH DM2, chronic pancreatitis, HTN, CAD, hx TIA who is presenting with 1 week if rhinorrhea. Started taking allergy meds and this helped a lot. Mild cough. No sob or wheezing. Denies sore throat. Denies fever or chills. Does feel more tired than normal. Pt was originally coming in for a pneumococcal vaccine but after chart review, pt had pneumovax in 2012 and prevnar-13 in 2015.  History of asthma: no History of env allergies: no Tobacco use: no  Pt is wondering if I can refer him to ortho, Dr. Sherilyn Cooter, for his leg pain and weakness. Pt states several years ago he was in an accident at work. He has never recovered. Most of pain in ankles and knees.  Review of Systems:  Review of Systems See HPI  Patient Active Problem List   Diagnosis Date Noted  . Hammer toe, acquired 09/30/2013  . Personal history of colonic adenomas 04/15/2013  . Chronic pancreatitis ([place]) 02/26/2013  . Anemia of chronic disease 02/26/2013  . Special screening for malignant neoplasms, colon 02/26/2013  . Essential hypertension, benign 12/14/2012  . Testosterone deficiency 10/22/2011  . Headache above the eye region 02/22/2011  . Type II or unspecified type diabetes mellitus with unspecified complication, uncontrolled 02/22/2011  . TIA on medication 02/22/2011  . Coronary artery disease 02/22/2011    Prior to Admission medications   Medication Sig Start Date End Date Taking? Authorizing Provider  atorvastatin (LIPITOR) 40 MG tablet Take 1 tablet (40 mg total) by mouth daily. 08/12/14  Yes Dorian Heckle English, PA  clopidogrel (PLAVIX) 75 MG tablet TAKE 1 TABLET EVERY DAY 02/21/15  Yes Stephanie D English, PA  CREON  12000 UNITS CPEP capsule TAKE TWO CAPSULES BY MOUTH THREE TIMES DAILY WITH MEALS   Yes Shawnee Knapp, MD  fish oil-omega-3 fatty acids 1000 MG capsule Take 2 g by mouth daily.     Yes Historical Provider, MD  glucose blood (EASY TOUCH TEST) test strip Use to test blood sugar 3 times daily. Dx code: 250.02 02/11/13  Yes Theda Sers, PA-C  Insulin Pen Needle 32G X 4 MM MISC Use as directed to inject insulin tidwc 12/14/12  Yes Shawnee Knapp, MD  l-methylfolate-B6-B12 [facility]) 3-35-2 MG TABS Take 1 tablet by mouth daily.     Yes Historical Provider, MD  Lancets MISC To check blood glucose tid 08/12/14  Yes Dorian Heckle English, PA  LEVEMIR FLEXTOUCH 100 UNIT/ML Pen INJECT  50 UNITS SUBCUTANEOUSLY TWICE DAILY 11/25/14  Yes Gay Filler Copland, MD  metFORMIN (GLUCOPHAGE) 1000 MG tablet TAKE 1 TABLET TWICE DAILY WITH MEALS 05/18/15  Yes Gay Filler Copland, MD  Multiple Vitamins-Minerals (MULTIVITAMIN WITH MINERALS) tablet Take 1 tablet by mouth daily.     Yes Historical Provider, MD  omeprazole (PRILOSEC) 40 MG capsule TAKE 1 CAPSULE EVERY DAY 02/21/15  Yes Dorian Heckle English, PA   No Known Allergies  Past Surgical History  Procedure Laterality Date  . Back surgery  1985  . Neck surgery  1985  . Temporal artery biopsy / ligation  02/26/2011    left side  . Artery biopsy  02/26/2011    Procedure: BIOPSY  TEMPORAL ARTERY;  Surgeon: Willey Blade, MD;  Location: [place];  Service: General;  Laterality: Left;  Left temporal artery biospy  . Cholecystectomy    . Colonoscopy    . Upper gastrointestinal endoscopy    . Eus      Social History  Substance Use Topics  . Smoking status: Former Smoker -- 35 years    Types: Cigarettes    Quit date: 04/09/1990  . Smokeless tobacco: Never Used  . Alcohol Use: No     Comment: "per pt stopped drinking in the 90's"    Family History  Problem Relation Age of Onset  . Heart disease Mother   . Diabetes Sister     x 2  . Heart attack Sister    . Amblyopia Father     Medication list has been reviewed and updated.  Physical Examination:  Physical Exam  Constitutional: He is oriented to person, place, and time. He appears well-developed and well-nourished. No distress.  HENT:  Head: Normocephalic and atraumatic.  Right Ear: Hearing, external ear and ear canal normal. Tympanic membrane is retracted.  Left Ear: Hearing, external ear and ear canal normal. Tympanic membrane is retracted.  Nose: Nose normal. Right sinus exhibits no maxillary sinus tenderness and no frontal sinus tenderness. Left sinus exhibits no maxillary sinus tenderness and no frontal sinus tenderness.  Mouth/Throat: Uvula is midline, oropharynx is clear and moist and mucous membranes are normal.  Eyes: Conjunctivae and lids are normal. Right eye exhibits no discharge. Left eye exhibits no discharge. No scleral icterus.  Cardiovascular: Normal rate, regular rhythm, normal heart sounds and normal pulses.   No murmur heard. Pulmonary/Chest: Effort normal and breath sounds normal. No respiratory distress. He has no wheezes. He has no rhonchi. He has no rales.  Musculoskeletal: Normal range of motion.  Gait is antalgic. Uses a cane  Lymphadenopathy:       Head (right side): No submental, no submandibular and no tonsillar adenopathy present.       Head (left side): No submental, no submandibular and no tonsillar adenopathy present.    He has no cervical adenopathy.  Neurological: He is alert and oriented to person, place, and time.  Skin: Skin is warm, dry and intact. No lesion and no rash noted.  Psychiatric: He has a normal mood and affect. His speech is normal and behavior is normal. Thought content normal.   BP 122/70 mmHg  Pulse 113  Temp(Src) 98.1 F (36.7 C) (Oral)  Resp 16  Ht 5\' 8"  (1.727 m)  Wt 189 lb 9.6 oz (86.002 kg)  BMI 28.84 kg/m2  SpO2 96%  Assessment and Plan:  1. Arthralgia Pt wishes to be referred to Dr. Sherilyn Cooter with Physical  Medicine Rehab d/t chronic ankle and knee arthralgias. Referral placed. - Ambulatory referral to Physical Medicine Rehab  2. Allergic rhinitis, unspecified allergic rhinitis type Symptoms improving with allergy meds. Continue.   Benjaman Pott Drenda Freeze, MHS [facility]  06/29/2015

## 2015-06-29 NOTE — Patient Instructions (Signed)
     IF you received an x-ray today, you will receive an invoice from [facility]. Please contact  Radiology at [phone] with questions or concerns regarding your invoice.   IF you received labwork today, you will receive an invoice from [facility]. Please contact [facility] at [phone] with questions or concerns regarding your invoice.   Our billing staff will not be able to assist you with questions regarding bills from these companies.  You will be contacted with the lab results as soon as they are available. The fastest way to get your results is to activate your My Chart account. Instructions are located on the last page of this paperwork. If you have not heard from us regarding the results in 2 weeks, please contact this office.      

## 2015-07-21 ENCOUNTER — Other Ambulatory Visit: Payer: Self-pay | Admitting: Physician Assistant

## 2015-07-21 ENCOUNTER — Other Ambulatory Visit: Payer: Self-pay | Admitting: Family Medicine

## 2015-07-21 ENCOUNTER — Telehealth: Payer: Self-pay

## 2015-07-21 MED ORDER — ALCOHOL SWABS PADS: MEDICATED_PAD | Status: DC

## 2015-07-21 MED ORDER — ACCU-CHEK AVIVA DEVI: Status: DC

## 2015-07-21 MED ORDER — INSULIN DETEMIR 100 UNIT/ML FLEXPEN: PEN_INJECTOR | SUBCUTANEOUS | Status: DC

## 2015-07-21 MED ORDER — ACCU-CHEK SOFT TOUCH LANCETS MISC: Status: DC

## 2015-07-21 MED ORDER — GLUCOSE BLOOD VI STRP: ORAL_STRIP | Status: DC

## 2015-07-21 MED ORDER — ACCU-CHEK AVIVA VI SOLN: Status: DC

## 2015-07-21 NOTE — Telephone Encounter (Signed)
PA completed for Levemir and faxed to [facility]. Pending.

## 2015-07-25 NOTE — Telephone Encounter (Signed)
PA denied. I put denial letter along w/PA form I had sent in and list of formulary meds on lower tier for pt's plan highlighted. I put this in Dr Lenn Cal box. Do you want to appeal?

## 2015-07-26 MED ORDER — INSULIN GLARGINE 100 UNIT/ML SOLOSTAR PEN: 50.0000 [IU] | PEN_INJECTOR | Freq: Two times a day (BID) | SUBCUTANEOUS | Status: DC

## 2015-08-01 NOTE — Telephone Encounter (Signed)
[employer] [facility] called to verify that Lantus is the correct current Rx and not the Levemir. I verified this and pharm stated that the Lantus is a little less expensive but not much. They will check with pt before sending it out to Pt.

## 2015-08-09 ENCOUNTER — Other Ambulatory Visit: Payer: Self-pay | Admitting: Family Medicine

## 2015-08-11 ENCOUNTER — Telehealth: Payer: Self-pay

## 2015-08-11 DIAGNOSIS — M1612 Unilateral primary osteoarthritis, left hip: Secondary | ICD-10-CM | POA: Insufficient documentation

## 2015-08-11 DIAGNOSIS — M170 Bilateral primary osteoarthritis of knee: Secondary | ICD-10-CM | POA: Insufficient documentation

## 2015-08-11 NOTE — Telephone Encounter (Signed)
I don't understand the cost increase either.  He can try stopping the Creon and going on a low fat diet:  No fried food, avoid heavy cream and whole milk and heavy sauces, no ice cream, minimal amounts of cheese.

## 2015-08-11 NOTE — Telephone Encounter (Signed)
Patient is calling because he states he was told by Dr. Joseph Art that he will have to take creon for the rest of his life. Patient states that it cost $160 at the pharmacy and has never paid that much. Patient does not know what to do and is afraid he'll end up in the emergency room because he is out of the medication. Please advise!  [phone]

## 2015-08-11 NOTE — Telephone Encounter (Signed)
Called pharm and asked why the Rx is so much more expensive than it used to be. They reported that the last time they filled it there was 2015 but that he had a different ins and only paid $6. He now has [employer] and they are covering it, but his co-pay is still about $160. Pharm stated that there is no alternative for this that they know of. They will let the pt know this, but Dr L, do you have any advice for pt?

## 2015-08-12 NOTE — Telephone Encounter (Signed)
LMOM for pt to CB. He should call his cust service on back of ins card and see if they can advise him of any way to get cost down. Otherwise, give pt Dr Lenn Cal inst's below.

## 2015-08-15 ENCOUNTER — Other Ambulatory Visit: Payer: Self-pay

## 2015-08-15 NOTE — Telephone Encounter (Signed)
Pt states  His Creon caps usually cost him $6, he went to [facility] to have filled and it had gone to $187,he cant afford that, States he used to get it thru [employer] phone for pt is [phone]

## 2015-08-16 ENCOUNTER — Telehealth: Payer: Self-pay

## 2015-08-16 NOTE — Telephone Encounter (Signed)
Pt is needing to talk with someone about his creon medication he does not know what happened but he has been trying for a week to get a refill then when he does it is too expensive   Best [phone]

## 2015-08-17 ENCOUNTER — Encounter: Payer: Self-pay | Admitting: Physician Assistant

## 2015-08-17 DIAGNOSIS — M1710 Unilateral primary osteoarthritis, unspecified knee: Secondary | ICD-10-CM | POA: Insufficient documentation

## 2015-08-17 DIAGNOSIS — E1142 Type 2 diabetes mellitus with diabetic polyneuropathy: Secondary | ICD-10-CM | POA: Insufficient documentation

## 2015-08-17 DIAGNOSIS — M179 Osteoarthritis of knee, unspecified: Secondary | ICD-10-CM | POA: Insufficient documentation

## 2015-08-17 NOTE — Telephone Encounter (Signed)
Pt advised of Dr. Pauletta Browns message.

## 2015-08-18 NOTE — Telephone Encounter (Signed)
Pt was advised by Jonelle Sidle, notes under other message.

## 2015-08-18 NOTE — Telephone Encounter (Signed)
I was actually able to find a copy of pt's formulary online and it looks like the Zenpep is on an even higher tier than the Creon, so that will not help pt with the cost.

## 2015-08-18 NOTE — Telephone Encounter (Signed)
Dr L, I just did a PA for another pt w/different ins for Creon, and it was denied because pt had not tried their preferred alternative: Zenpep. I do not know if pt's ins will cover this, and when I had spoken to pharm he didn't know of an alternative for Creon, but do you want to try it for this pt to see if it would be less expensive? I did not pend because didn't know what strength you would want to Rx.

## 2015-09-06 ENCOUNTER — Ambulatory Visit (INDEPENDENT_AMBULATORY_CARE_PROVIDER_SITE_OTHER): Payer: Medicare HMO | Admitting: Family Medicine

## 2015-09-06 VITALS — BP 132/84 | HR 92 | Temp 97.8°F | Resp 16 | Ht 68.0 in | Wt 189.4 lb

## 2015-09-06 DIAGNOSIS — Z794 Long term (current) use of insulin: Secondary | ICD-10-CM

## 2015-09-06 DIAGNOSIS — I2510 Atherosclerotic heart disease of native coronary artery without angina pectoris: Secondary | ICD-10-CM | POA: Diagnosis not present

## 2015-09-06 DIAGNOSIS — R61 Generalized hyperhidrosis: Secondary | ICD-10-CM

## 2015-09-06 DIAGNOSIS — M1990 Unspecified osteoarthritis, unspecified site: Secondary | ICD-10-CM

## 2015-09-06 DIAGNOSIS — K861 Other chronic pancreatitis: Secondary | ICD-10-CM

## 2015-09-06 DIAGNOSIS — E1142 Type 2 diabetes mellitus with diabetic polyneuropathy: Secondary | ICD-10-CM

## 2015-09-06 DIAGNOSIS — R634 Abnormal weight loss: Secondary | ICD-10-CM

## 2015-09-06 DIAGNOSIS — R809 Proteinuria, unspecified: Secondary | ICD-10-CM | POA: Diagnosis not present

## 2015-09-06 DIAGNOSIS — E162 Hypoglycemia, unspecified: Secondary | ICD-10-CM | POA: Diagnosis not present

## 2015-09-06 DIAGNOSIS — E1121 Type 2 diabetes mellitus with diabetic nephropathy: Secondary | ICD-10-CM | POA: Diagnosis not present

## 2015-09-06 LAB — POCT URINALYSIS DIP (MANUAL ENTRY)
BILIRUBIN UA: NEGATIVE
Blood, UA: NEGATIVE
Glucose, UA: NEGATIVE
Ketones, POC UA: NEGATIVE
LEUKOCYTES UA: NEGATIVE
NITRITE UA: NEGATIVE
PH UA: 5
Spec Grav, UA: >=1.03
Urobilinogen, UA: 1

## 2015-09-06 LAB — POCT GLYCOSYLATED HEMOGLOBIN (HGB A1C): Hemoglobin A1C: 7.5

## 2015-09-06 LAB — GLUCOSE, POCT (MANUAL RESULT ENTRY): POC GLUCOSE: 54 mg/dL — AB (ref 70–99)

## 2015-09-06 LAB — SEDIMENTATION RATE: Sed Rate: 36 mm/hr — ABNORMAL HIGH (ref 0–20)

## 2015-09-06 MED ORDER — METFORMIN HCL 1000 MG PO TABS: ORAL_TABLET | ORAL | Status: DC

## 2015-09-06 MED ORDER — INSULIN GLARGINE 100 UNIT/ML SOLOSTAR PEN: 48.0000 [IU] | PEN_INJECTOR | Freq: Two times a day (BID) | SUBCUTANEOUS | Status: DC

## 2015-09-06 NOTE — Progress Notes (Addendum)
By signing my name below, I, Mesha Guinyard, attest that this documentation has been prepared under the direction and in the presence of Delman Cheadle, MD.  Electronically Signed: Verlee Monte, Medical Scribe. 09/06/2015. 1:26 PM.  Subjective:    Patient ID: Justin [name], male    DOB: February 03, 1935, 81 y.o.   MRN: [id number]  HPI Chief Complaint  Patient presents with  . Numbness    Right hand  . Edema    Right hand     HPI Comments: Justin [name] is a 81 y.o. male who [facility] complaining of night sweats last night. Pt soaked the bed and had multiple episodes last week. Pt eats crackers and drinks a cold drink when he wakes up sweating. Pt takes 1000 mg metforim BID, insulin (levemir) BID. Pt reports levemir is too expensive since he switched to humara. Pt got rid of his car because he couldn't afford it. When pt stops taking his levemir his stomach gets upset. Pt hardly eats fast foods or cheese. Pt has been checking his sugars and they've been ranging around 100. Pt denies abdominal pain, or sores on his feet. Pt mentioned he ate earlier today. It's been less than a year since pt saw his eye doctor.  Hand: Pt's right MCPs and the PIPs is numb and digits 2-5 are swollen when he wakes up, but finds relief of his symptoms when he moves around. Pt denies pain, erythema, warmth in his around the MCPs and the PIPs in his right hands.  Hip: Pt went to Dr. Rochele Pages and got an x-ray in his left hip and gave him a shot. Pt walks a lot better and takes pills TID. One of them is for pain, but pt tries not to use it. Pt states he can walk without his cane. Pt takes fish oil. Pt no longer takes aleve or ASA.  PA on levemir denies pt. Pharmacy say lantic will be less expensive for him- 1 month ago Pharmacy also confirmed creon copay from $6 to $160 - here are no alt medications for this.  Patient Active Problem List   Diagnosis Date Noted  . Diabetic peripheral  neuropathy ([place]) 08/17/2015  . OA (osteoarthritis) of knee 08/17/2015  . Hammer toe, acquired 09/30/2013  . Personal history of colonic adenomas 04/15/2013  . Chronic pancreatitis ([place]) 02/26/2013  . Anemia of chronic disease 02/26/2013  . Special screening for malignant neoplasms, colon 02/26/2013  . Essential hypertension, benign 12/14/2012  . Testosterone deficiency 10/22/2011  . Headache above the eye region 02/22/2011  . Type II or unspecified type diabetes mellitus with unspecified complication, uncontrolled 02/22/2011  . TIA on medication 02/22/2011  . Coronary artery disease 02/22/2011   Past Medical History  Diagnosis Date  . HTN (hypertension)   . Diabetes mellitus   . Hypercholesteremia   . TIA (transient ischemic attack)   . Iron deficiency   . Arthritis   . Stroke ([place])   . Pancreatitis, acute 06/02/2012    Diagnosed 05/2012   . Personal history of colonic adenomas 04/15/2013    04/15/2013 2 diminutive polyps     Past Surgical History  Procedure Laterality Date  . Back surgery  1985  . Neck surgery  1985  . Temporal artery biopsy / ligation  02/26/2011    left side  . Artery biopsy  02/26/2011    Procedure: BIOPSY TEMPORAL ARTERY;  Surgeon: Willey Blade, MD;  Location: [place];  Service: General;  Laterality: Left;  Left temporal artery biospy  . Cholecystectomy    . Colonoscopy    . Upper gastrointestinal endoscopy    . Eus     No Known Allergies Prior to Admission medications   Medication Sig Start Date End Date Taking? Authorizing Provider  Alcohol Swabs PADS Use to check blood sugar 3 times daily. Dx: E10.9 07/21/15  Yes Stephanie D English, PA  atorvastatin (LIPITOR) 40 MG tablet TAKE 1 TABLET EVERY DAY 07/21/15  Yes Dorian Heckle English, PA  Blood Glucose Calibration (ACCU-CHEK AVIVA) SOLN Use to check blood sugar 3 times daily. Dx: E10.9 07/21/15  Yes Stephanie D English, PA  Blood Glucose Monitoring Suppl (ACCU-CHEK AVIVA) device Use  to check blood sugar 3 times daily. Dx: E10.9 07/21/15 07/20/16 Yes Stephanie D English, PA  clopidogrel (PLAVIX) 75 MG tablet TAKE 1 TABLET EVERY DAY 02/21/15  Yes Dorian Heckle English, PA  CREON 12000 units CPEP capsule TAKE TWO CAPSULES BY MOUTH THREE TIMES DAILY WITH MEALS 08/11/15  Yes Shawnee Knapp, MD  fish oil-omega-3 fatty acids 1000 MG capsule Take 2 g by mouth daily.     Yes Historical Provider, MD  glucose blood (ACCU-CHEK AVIVA PLUS) test strip Use to check blood sugar 3 times daily. Dx: E10.9 07/21/15  Yes Stephanie D English, PA  Insulin Detemir (LEVEMIR FLEXTOUCH) 100 UNIT/ML Pen INJECT  50 UNITS SUBCUTANEOUSLY TWICE DAILY 07/21/15  Yes Colletta [place] D English, PA  Insulin Glargine (LANTUS SOLOSTAR) 100 UNIT/ML Solostar Pen Inject 50 Units into the skin 2 (two) times daily. Bid 07/26/15  Yes Robyn Haber, MD  Insulin Pen Needle 32G X 4 MM MISC Use as directed to inject insulin tidwc 12/14/12  Yes Shawnee Knapp, MD  l-methylfolate-B6-B12 [facility]) 3-35-2 MG TABS Take 1 tablet by mouth daily.     Yes Historical Provider, MD  Lancets (ACCU-CHEK SOFT TOUCH) lancets Use to check blood sugar 3 times daily. Dx: E10.9 07/21/15  Yes Stephanie D English, PA  metFORMIN (GLUCOPHAGE) 1000 MG tablet TAKE 1 TABLET TWICE DAILY WITH MEALS NEED MD APPOINTMENT 07/21/15  Yes Gay Filler Copland, MD  Multiple Vitamins-Minerals (MULTIVITAMIN WITH MINERALS) tablet Take 1 tablet by mouth daily.     Yes Historical Provider, MD  omeprazole (PRILOSEC) 40 MG capsule TAKE 1 CAPSULE EVERY DAY 02/21/15  Yes Joretta Bachelor, PA   Social History   Social History  . Marital Status: Married    Spouse Name: N/A  . Number of Children: 6  . Years of Education: N/A   Occupational History  . Not on file.   Social History Main Topics  . Smoking status: Former Smoker -- 35 years    Types: Cigarettes    Quit date: 04/09/1990  . Smokeless tobacco: Never Used  . Alcohol Use: No     Comment: "per pt stopped drinking in the 90's"  .  Drug Use: No  . Sexual Activity: Not on file   Other Topics Concern  . Not on file   Social History Narrative   Depression screen [facility] 2/9 09/06/2015 06/29/2015 05/20/2015  Decreased Interest 0 0 0  Down, Depressed, Hopeless 0 0 0  PHQ - 2 Score 0 0 0    Review of Systems  Constitutional: Positive for diaphoresis. Negative for fever, chills, activity change and appetite change.  Respiratory: Negative for cough.   Cardiovascular: Negative for chest pain and leg swelling.  Gastrointestinal: Negative for nausea, vomiting, abdominal pain, diarrhea, constipation, blood in stool  and abdominal distention.  Musculoskeletal: Positive for joint swelling. Negative for arthralgias.  Neurological: Positive for tremors, light-headedness and numbness. Negative for dizziness.  Psychiatric/Behavioral: Positive for sleep disturbance.    Objective:  BP 132/84 mmHg  Pulse 92  Temp(Src) 97.8 F (36.6 C) (Oral)  Resp 16  Ht 5\' 8"  (1.727 m)  Wt 189 lb 6.4 oz (85.911 kg)  BMI 28.80 kg/m2  SpO2 97%  Physical Exam  Constitutional: He appears well-developed and well-nourished. No distress.  HENT:  Head: Normocephalic and atraumatic.  Eyes: Conjunctivae are normal.  Neck: Neck supple.  Cardiovascular: Normal rate, regular rhythm and normal heart sounds.  Exam reveals no gallop and no friction rub.   No murmur heard. Pulmonary/Chest: Effort normal and breath sounds normal. No respiratory distress. He has no wheezes. He has no rales.  Musculoskeletal:  FROM in fingers Enlarged PIP and MCP joints - digits 2-5  Neurological: He is alert.  5/5 grip strength with grasp Neg Tinel's and neg Phal'es 5/5 strength on resisted wrist flexion and extension  Skin: Skin is warm and dry.  Psychiatric: He has a normal mood and affect. His behavior is normal.  Nursing note and vitals reviewed.  Results for orders placed or performed in visit on 09/06/15  POCT urinalysis dipstick  Result Value Ref Range    Color, UA orange (A) yellow   Clarity, UA clear clear   Glucose, UA negative negative   Bilirubin, UA negative negative   Ketones, POC UA negative negative   Spec Grav, UA >=1.030    Blood, UA negative negative   pH, UA 5.0    Protein Ur, POC >=300 (A) negative   Urobilinogen, UA 1.0    Nitrite, UA Negative Negative   Leukocytes, UA Negative Negative   Assessment & Plan:   1. Type 2 diabetes mellitus with diabetic nephropathy, with long-term current use of insulin ([place]) - pt is now having regular hypoglycemic episodes - likely due to recent weight loss - so decrease lantus from 50 bid to 48 bid - asked pt to bring all meds inc insulin to f/u to confirm that we have his med list correct.    2. Night sweats - new onset nightsweats and unintentional weight loss concerning - pt thinks night sweats are only due to hypoglycemic episodes so will back off on insulin to ensure they stop - if not, needs thorough w/u to r/o occult malignancy  3. Loss of weight - 10 lbs in past year but 5 lbs in past 4 mos  4. Hypoglycemia   5. Arthritis   6. Chronic pancreatitis, unspecified pancreatitis type ([place])   7. Diabetic peripheral neuropathy ([place])   8. Coronary artery disease involving native coronary artery of native heart without angina pectoris   9. Proteinuria    F/u in 1 wk  Orders Placed This Encounter  Procedures  . Comprehensive metabolic panel  . Lipase  . Sedimentation Rate  . Uric Acid  . POCT glycosylated hemoglobin (Hb A1C)  . POCT glucose (manual entry)  . POCT urinalysis dipstick    Meds ordered this encounter  Medications  . Insulin Glargine (LANTUS SOLOSTAR) 100 UNIT/ML Solostar Pen    Sig: Inject 48 Units into the skin 2 (two) times daily. Bid    Dispense:  5 pen    Refill:  1    Ok to change to basilgar, same dosing and sig, if cheaper from lantus  . metFORMIN (GLUCOPHAGE) 1000 MG tablet    Sig: TAKE  1 TABLET TWICE DAILY WITH MEALS    Dispense:  180 tablet    Refill:   3    I personally performed the services described in this documentation, which was scribed in my presence. The recorded information has been reviewed and considered, and addended by me as needed.   Delman Cheadle, M.D.  [facility] [address] [place], [place] [address] [phone] phone [phone] fax  09/12/2015 12:39 AM

## 2015-09-06 NOTE — Patient Instructions (Addendum)
Decrease your lantus to 48u twice a day.  Bring whatever new insulin you were switched to (novolin??) to your visit in 1 week.  I suspect you might have change in insulin requirement but the weight loss and night sweats are concerning. You are also very dehydrated and so I want to make sure nothing else is going on other than a change in your insulin.   IF you received an x-ray today, you will receive an invoice from [facility]. Please contact [facility] at [phone] with questions or concerns regarding your invoice.   IF you received labwork today, you will receive an invoice from [employer]. Please contact [facility] at [phone] with questions or concerns regarding your invoice.   Our billing staff will not be able to assist you with questions regarding bills from these companies.  You will be contacted with the lab results as soon as they are available. The fastest way to get your results is to activate your My Chart account. Instructions are located on the last page of this paperwork. If you have not heard from [place] regarding the results in 2 weeks, please contact this office.     Low-Fat Diet for Pancreatitis or Gallbladder Conditions A low-fat diet can be helpful if you have pancreatitis or a gallbladder condition. With these conditions, your pancreas and gallbladder have trouble digesting fats. A healthy eating plan with less fat will help rest your pancreas and gallbladder and reduce your symptoms. WHAT DO I NEED TO KNOW ABOUT THIS DIET?  Eat a low-fat diet.  Reduce your fat intake to less than 20-30% of your total daily calories. This is less than 50-60 g of fat per day.  Remember that you need some fat in your diet. Ask your dietician what your daily goal should be.  Choose nonfat and low-fat healthy foods. Look for the words "nonfat," "low fat," or "fat free."  As a guide, look on the label and choose foods with less than 3 g of  fat per serving. Eat only one serving.  Avoid alcohol.  Do not smoke. If you need help quitting, talk with your health care provider.  Eat small frequent meals instead of three large heavy meals. WHAT FOODS CAN I EAT? Grains Include healthy grains and starches such as potatoes, wheat bread, fiber-rich cereal, and brown rice. Choose whole grain options whenever possible. In adults, whole grains should account for 45-65% of your daily calories.  Fruits and Vegetables Eat plenty of fruits and vegetables. Fresh fruits and vegetables add fiber to your diet. Meats and Other Protein Sources Eat lean meat such as chicken and pork. Trim any fat off of meat before cooking it. Eggs, fish, and beans are other sources of protein. In adults, these foods should account for 10-35% of your daily calories. Dairy Choose low-fat milk and dairy options. Dairy includes fat and protein, as well as calcium.  Fats and Oils Limit high-fat foods such as fried foods, sweets, baked goods, sugary drinks.  Other Creamy sauces and condiments, such as mayonnaise, can add extra fat. Think about whether or not you need to use them, or use smaller amounts or low fat options. WHAT FOODS ARE NOT RECOMMENDED?  High fat foods, such as:  [employer].  Ice cream.  [place] toast.  Sweet rolls.  Pizza.  Cheese bread.  Foods covered with batter, butter, creamy sauces, or cheese.  Fried foods.  Sugary drinks and desserts.  Foods that cause gas or bloating  This information is not intended to replace advice given to you by your health care provider. Make sure you discuss any questions you have with your health care provider.   Document Released: 03/31/2013 Document Reviewed: 03/31/2013 Elsevier Interactive Patient Education 2016 [employer]. Hypoglycemia Hypoglycemia occurs when the glucose in your blood is too low. Glucose is a type of sugar that is your body's main energy source. Hormones, such as insulin and  glucagon, control the level of glucose in the blood. Insulin lowers blood glucose and glucagon increases blood glucose. Having too much insulin in your blood stream, or not eating enough food containing sugar, can result in hypoglycemia. Hypoglycemia can happen to people with or without diabetes. It can develop quickly and can be a medical emergency.  CAUSES   Missing or delaying meals.  Not eating enough carbohydrates at meals.  Taking too much diabetes medicine.  Not timing your oral diabetes medicine or insulin doses with meals, snacks, and exercise.  Nausea and vomiting.  Certain medicines.  Severe illnesses, such as hepatitis, kidney disorders, and certain eating disorders.  Increased activity or exercise without eating something extra or adjusting medicines.  Drinking too much alcohol.  A nerve disorder that affects body functions like your heart rate, blood pressure, and digestion (autonomic neuropathy).  A condition where the stomach muscles do not function properly (gastroparesis). Therefore, medicines and food may not absorb properly.  Rarely, a tumor of the pancreas can produce too much insulin. SYMPTOMS   Hunger.  Sweating (diaphoresis).  Change in body temperature.  Shakiness.  Headache.  Anxiety.  Lightheadedness.  Irritability.  Difficulty concentrating.  Dry mouth.  Tingling or numbness in the hands or feet.  Restless sleep or sleep disturbances.  Altered speech and coordination.  Change in mental status.  Seizures or prolonged convulsions.  Combativeness.  Drowsiness (lethargic).  Weakness.  Increased heart rate or palpitations.  Confusion.  Pale, gray skin color.  Blurred or double vision.  Fainting. DIAGNOSIS  A physical exam and medical history will be performed. Your caregiver may make a diagnosis based on your symptoms. Blood tests and other lab tests may be performed to confirm a diagnosis. Once the diagnosis is made,  your caregiver will see if your signs and symptoms go away once your blood glucose is raised.  TREATMENT  Usually, you can easily treat your hypoglycemia when you notice symptoms.  Check your blood glucose. If it is less than 70 mg/dl, take one of the following:   3-4 glucose tablets.    cup juice.    cup regular soda.   1 cup skim milk.   -1 tube of glucose gel.   5-6 hard candies.   Avoid high-fat drinks or food that may delay a rise in blood glucose levels.  Do not take more than the recommended amount of sugary foods, drinks, gel, or tablets. Doing so will cause your blood glucose to go too high.   Wait 10-15 minutes and recheck your blood glucose. If it is still less than 70 mg/dl or below your target range, repeat treatment.   Eat a snack if it is more than 1 hour until your next meal.  There may be a time when your blood glucose may go so low that you are unable to treat yourself at home when you start to notice symptoms. You may need someone to help you. You may even faint or be unable to swallow. If you cannot treat yourself, someone will need to bring you  to the hospital.  [place]  If you have diabetes, follow your diabetes management plan by:  Taking your medicines as directed.  Following your exercise plan.  Following your meal plan. Do not skip meals. Eat on time.  Testing your blood glucose regularly. Check your blood glucose before and after exercise. If you exercise longer or different than usual, be sure to check blood glucose more frequently.  Wearing your medical alert jewelry that says you have diabetes.  Identify the cause of your hypoglycemia. Then, develop ways to prevent the recurrence of hypoglycemia.  Do not take a hot bath or shower right after an insulin shot.  Always carry treatment with you. Glucose tablets are the easiest to carry.  If you are going to drink alcohol, drink it only with meals.  Tell friends or  family members ways to keep you safe during a seizure. This may include removing hard or sharp objects from the area or turning you on your side.  Maintain a healthy weight. SEEK MEDICAL CARE IF:   You are having problems keeping your blood glucose in your target range.  You are having frequent episodes of hypoglycemia.  You feel you might be having side effects from your medicines.  You are not sure why your blood glucose is dropping so low.  You notice a change in vision or a new problem with your vision. SEEK IMMEDIATE MEDICAL CARE IF:   Confusion develops.  A change in mental status occurs.  The inability to swallow develops.  Fainting occurs.   This information is not intended to replace advice given to you by your health care provider. Make sure you discuss any questions you have with your health care provider.   Document Released: 03/26/2005 Document Revised: 03/31/2013 Document Reviewed: 11/30/2014 Elsevier Interactive Patient Education [employer].

## 2015-09-07 ENCOUNTER — Telehealth: Payer: Self-pay | Admitting: *Deleted

## 2015-09-07 LAB — COMPREHENSIVE METABOLIC PANEL
ALT: 12 U/L (ref 9–46)
AST: 18 U/L (ref 10–35)
Albumin: 3.9 g/dL (ref 3.6–5.1)
Alkaline Phosphatase: 77 U/L (ref 40–115)
BUN: 16 mg/dL (ref 7–25)
CO2: 26 mmol/L (ref 20–31)
Calcium: 9.1 mg/dL (ref 8.6–10.3)
Chloride: 100 mmol/L (ref 98–110)
Creat: 1 mg/dL (ref 0.70–1.18)
GLUCOSE: 40 mg/dL — AB (ref 65–99)
Potassium: 4.6 mmol/L (ref 3.5–5.3)
Sodium: 138 mmol/L (ref 135–146)
TOTAL PROTEIN: 7.9 g/dL (ref 6.1–8.1)
Total Bilirubin: 0.7 mg/dL (ref 0.2–1.2)

## 2015-09-07 LAB — LIPASE: Lipase: 7 U/L (ref 7–60)

## 2015-09-07 LAB — URIC ACID: Uric Acid, Serum: 4.4 mg/dL (ref 4.0–7.8)

## 2015-09-07 NOTE — Telephone Encounter (Signed)
[facility] called stating they had a critical low glucose of 40 on this patient.  The message was relayed to Dr. Brigitte Pulse on 09/07/15 at  9:10 am and she was aware of the situation.

## 2015-09-15 ENCOUNTER — Ambulatory Visit (INDEPENDENT_AMBULATORY_CARE_PROVIDER_SITE_OTHER): Payer: Medicare HMO | Admitting: Urgent Care

## 2015-09-15 VITALS — BP 160/90 | HR 105 | Temp 98.0°F | Resp 17 | Ht 68.0 in | Wt 188.0 lb

## 2015-09-15 DIAGNOSIS — R634 Abnormal weight loss: Secondary | ICD-10-CM

## 2015-09-15 DIAGNOSIS — E119 Type 2 diabetes mellitus without complications: Secondary | ICD-10-CM | POA: Diagnosis not present

## 2015-09-15 DIAGNOSIS — Z789 Other specified health status: Secondary | ICD-10-CM

## 2015-09-15 DIAGNOSIS — E162 Hypoglycemia, unspecified: Secondary | ICD-10-CM

## 2015-09-15 DIAGNOSIS — R61 Generalized hyperhidrosis: Secondary | ICD-10-CM | POA: Diagnosis not present

## 2015-09-15 DIAGNOSIS — E1121 Type 2 diabetes mellitus with diabetic nephropathy: Secondary | ICD-10-CM | POA: Diagnosis not present

## 2015-09-15 DIAGNOSIS — Z794 Long term (current) use of insulin: Secondary | ICD-10-CM | POA: Diagnosis not present

## 2015-09-15 LAB — POCT URINALYSIS DIP (MANUAL ENTRY)
Bilirubin, UA: NEGATIVE
GLUCOSE UA: NEGATIVE
Ketones, POC UA: NEGATIVE
LEUKOCYTES UA: NEGATIVE
NITRITE UA: NEGATIVE
Protein Ur, POC: >=300 — AB
Spec Grav, UA: >=1.03
UROBILINOGEN UA: 1
pH, UA: 5

## 2015-09-15 LAB — COMPLETE METABOLIC PANEL WITH GFR
ALBUMIN: 3.7 g/dL (ref 3.6–5.1)
ALK PHOS: 73 U/L (ref 40–115)
ALT: 12 U/L (ref 9–46)
AST: 16 U/L (ref 10–35)
BUN: 11 mg/dL (ref 7–25)
CO2: 26 mmol/L (ref 20–31)
Calcium: 9.1 mg/dL (ref 8.6–10.3)
Chloride: 99 mmol/L (ref 98–110)
Creat: 0.92 mg/dL (ref 0.70–1.18)
GFR, EST NON AFRICAN AMERICAN: 79 mL/min (ref >=60)
GFR, Est African American: >89 mL/min (ref >=60)
GLUCOSE: 145 mg/dL — AB (ref 65–99)
POTASSIUM: 4.4 mmol/L (ref 3.5–5.3)
SODIUM: 136 mmol/L (ref 135–146)
Total Bilirubin: 0.8 mg/dL (ref 0.2–1.2)
Total Protein: 8.1 g/dL (ref 6.1–8.1)

## 2015-09-15 LAB — SEDIMENTATION RATE: SED RATE: 25 mm/h — AB (ref 0–20)

## 2015-09-15 LAB — GLUCOSE, POCT (MANUAL RESULT ENTRY): POC GLUCOSE: 159 mg/dL — AB (ref 70–99)

## 2015-09-15 NOTE — Progress Notes (Signed)
MRN: [id number] DOB: 1934/10/30  Subjective:   Justin [name] is a 81 y.o. male presenting for chief complaint of Follow-up  Patient was seen last on 09/06/2015 by Dr. Brigitte Pulse for multiple symptoms including severe hypoglycemia, night sweats, weight loss, hand swelling. Patient's insulin (Lantus) was decreased to 48 units BID. Refer to note for more information. Today, he presents for f/u and review of his labs. Reports that he feels much better. He has not been having any night sweats since last visit. He is also hydrating well. Patient was supposed to bring his medications for review but he did not do that. He manages his medications as well as his wife's. He does have a daughter who works as a CMA that helps him some of the time. Denies smoking cigarettes or drinking alcohol.   Justin [name] has a current medication list which includes the following prescription(s): alcohol swabs, atorvastatin, accu-chek aviva, clopidogrel, creon, fish oil-omega-3 fatty acids, glucose blood, insulin glargine, insulin pen needle, l-methylfolate-b6-b12, accu-chek soft touch, metformin, multivitamin with minerals, and omeprazole, and the following Facility-Administered Medications: testosterone cypionate. Also has No Known Allergies.  Justin [name]  has a past medical history of HTN (hypertension); Diabetes mellitus; Hypercholesteremia; TIA (transient ischemic attack); Iron deficiency; Arthritis; Stroke ([place]); Pancreatitis, acute (06/02/2012); and Personal history of colonic adenomas (04/15/2013). Also  has past surgical history that includes Back surgery (1985); Neck surgery (1985); Temporal artery biopsy / ligation (02/26/2011); Artery Biopsy (02/26/2011); Cholecystectomy; Colonoscopy; Upper gastrointestinal endoscopy; and EUS.  Objective:   Vitals: BP 158/88 mmHg  Pulse 105  Temp(Src) 98 F (36.7 C) (Oral)  Resp 17  Ht 5\' 8"  (1.727 m)  Wt 188 lb (85.276 kg)  BMI 28.59 kg/m2  SpO2 97%  Physical Exam  Constitutional: He  is oriented to person, place, and time. He appears well-developed and well-nourished.  HENT:  Mouth/Throat: Oropharynx is clear and moist.  Eyes: No scleral icterus.  Neck: Normal range of motion. Neck supple.  Cardiovascular: Normal rate, regular rhythm and intact distal pulses.  Exam reveals no gallop and no friction rub.   No murmur heard. Pulmonary/Chest: No respiratory distress. He has no wheezes. He has no rales.  Neurological: He is alert and oriented to person, place, and time.  Skin: Skin is warm and dry.   Results for orders placed or performed in visit on 09/15/15 (from the past 24 hour(s))  POCT urinalysis dipstick     Status: Abnormal   Collection Time: 09/15/15 11:05 AM  Result Value Ref Range   Color, UA yellow yellow   Clarity, UA clear clear   Glucose, UA negative negative   Bilirubin, UA negative negative   Ketones, POC UA negative negative   Spec Grav, UA >=1.030    Blood, UA trace-intact (A) negative   pH, UA 5.0    Protein Ur, POC >=300 (A) negative   Urobilinogen, UA 1.0    Nitrite, UA Negative Negative   Leukocytes, UA Negative Negative  POCT glucose (manual entry)     Status: Abnormal   Collection Time: 09/15/15 11:05 AM  Result Value Ref Range   POC Glucose 159 (A) 70 - 99 mg/dl   Assessment and Plan :   1. Controlled type 2 diabetes mellitus with diabetic nephropathy, with long-term current use of insulin (Justin [name]) 2. Hypoglycemia 3. Night sweats 4. Recent unexplained weight loss - Improved, labs pending. Medical compliance might still be an issue. I will forward this note to Dr. Brigitte Pulse for her review. I did request  patient return on 09/23/2015 for f/u with Dr. Brigitte Pulse. For now, patient is to continue current regimen of Lantus. I will attempt to review medications with  Justin [name], his daughter. Her phone number is [phone] and the patient is okay with [place] calling and confirming medical compliance.  Justin Eagles, PA-C [facility] [phone] 09/15/2015 10:40 AM

## 2015-09-15 NOTE — Patient Instructions (Addendum)
Type 2 Diabetes Mellitus, Adult Type 2 diabetes mellitus, often simply referred to as type 2 diabetes, is a long-lasting (chronic) disease. In type 2 diabetes, the pancreas does not make enough insulin (a hormone), the cells are less responsive to the insulin that is made (insulin resistance), or both. Normally, insulin moves sugars from food into the tissue cells. The tissue cells use the sugars for energy. The lack of insulin or the lack of normal response to insulin causes excess sugars to build up in the blood instead of going into the tissue cells. As a result, high blood sugar (hyperglycemia) develops. The effect of high sugar (glucose) levels can cause many complications. Type 2 diabetes was also previously called adult-onset diabetes, but it can occur at any age.  RISK FACTORS  A person is predisposed to developing type 2 diabetes if someone in the family has the disease and also has one or more of the following primary risk factors:  Weight gain, or being overweight or obese.  An inactive lifestyle.  A history of consistently eating high-calorie foods. Maintaining a normal weight and regular physical activity can reduce the chance of developing type 2 diabetes. SYMPTOMS  A person with type 2 diabetes may not show symptoms initially. The symptoms of type 2 diabetes appear slowly. The symptoms include:  Increased thirst (polydipsia).  Increased urination (polyuria).  Increased urination during the night (nocturia).  Sudden or unexplained weight changes.  Frequent, recurring infections.  Tiredness (fatigue).  Weakness.  Vision changes, such as blurred vision.  Fruity smell to your breath.  Abdominal pain.  Nausea or vomiting.  Cuts or bruises which are slow to heal.  Tingling or numbness in the hands or feet.  An open skin wound (ulcer). DIAGNOSIS Type 2 diabetes is frequently not diagnosed until complications of diabetes are present. Type 2 diabetes is diagnosed  when symptoms or complications are present and when blood glucose levels are increased. Your blood glucose level may be checked by one or more of the following blood tests:  A fasting blood glucose test. You will not be allowed to eat for at least 8 hours before a blood sample is taken.  A random blood glucose test. Your blood glucose is checked at any time of the day regardless of when you ate.  A hemoglobin A1c blood glucose test. A hemoglobin A1c test provides information about blood glucose control over the previous 3 months.  An oral glucose tolerance test (OGTT). Your blood glucose is measured after you have not eaten (fasted) for 2 hours and then after you drink a glucose-containing beverage. TREATMENT   You may need to take insulin or diabetes medicine daily to keep blood glucose levels in the desired range.  If you use insulin, you may need to adjust the dosage depending on the carbohydrates that you eat with each meal or snack.  Lifestyle changes are recommended as part of your treatment. These may include:  Following an individualized diet plan developed by a nutritionist or dietitian.  Exercising daily. Your health care providers will set individualized treatment goals for you based on your age, your medicines, how long you have had diabetes, and any other medical conditions you have. Generally, the goal of treatment is to maintain the following blood glucose levels:  Before meals (preprandial): 80-130 mg/dL.  After meals (postprandial): below 180 mg/dL.  A1c: less than 6.5-7%. HOME CARE INSTRUCTIONS   Have your hemoglobin A1c level checked twice a year.  Perform daily blood glucose monitoring   as directed by your health care provider.  Monitor urine ketones when you are ill and as directed by your health care provider.  Take your diabetes medicine or insulin as directed by your health care provider to maintain your blood glucose levels in the desired range.  Never run  out of diabetes medicine or insulin. It is needed every day.  If you are using insulin, you may need to adjust the amount of insulin given based on your intake of carbohydrates. Carbohydrates can raise blood glucose levels but need to be included in your diet. Carbohydrates provide vitamins, minerals, and fiber which are an essential part of a healthy diet. Carbohydrates are found in fruits, vegetables, whole grains, dairy products, legumes, and foods containing added sugars.  Eat healthy foods. You should make an appointment to see a registered dietitian to help you create an eating plan that is right for you.  Lose weight if you are overweight.  Carry a medical alert card or wear your medical alert jewelry.  Carry a 15-gram carbohydrate snack with you at all times to treat low blood glucose (hypoglycemia). Some examples of 15-gram carbohydrate snacks include:  Glucose tablets, 3 or 4.  Glucose gel, 15-gram tube.  Raisins, 2 tablespoons (24 grams).  Jelly beans, 6.  Animal crackers, 8.  Regular pop, 4 ounces (120 mL).  Gummy treats, 9.  Recognize hypoglycemia. Hypoglycemia occurs with blood glucose levels of 70 mg/dL and below. The risk for hypoglycemia increases when fasting or skipping meals, during or after intense exercise, and during sleep. Hypoglycemia symptoms can include:  Tremors or shakes.  Decreased ability to concentrate.  Sweating.  Increased heart rate.  Headache.  Dry mouth.  Hunger.  Irritability.  Anxiety.  Restless sleep.  Altered speech or coordination.  Confusion.  Treat hypoglycemia promptly. If you are alert and able to safely swallow, follow the 15:15 rule:  Take 15-20 grams of rapid-acting glucose or carbohydrate. Rapid-acting options include glucose gel, glucose tablets, or 4 ounces (120 mL) of fruit juice, regular soda, or low-fat milk.  Check your blood glucose level 15 minutes after taking the glucose.  Take 15-20 grams more of  glucose if the repeat blood glucose level is still 70 mg/dL or below.  Eat a meal or snack within 1 hour once blood glucose levels return to normal.  Be alert to feeling very thirsty and urinating more frequently than usual, which are early signs of hyperglycemia. An early awareness of hyperglycemia allows for prompt treatment. Treat hyperglycemia as directed by your health care provider.  Engage in at least 150 minutes of moderate-intensity physical activity a week, spread over at least 3 days of the week or as directed by your health care provider. In addition, you should engage in resistance exercise at least 2 times a week or as directed by your health care provider. Try to spend no more than 90 minutes at one time inactive.  Adjust your medicine and food intake as needed if you start a new exercise or sport.  Follow your sick-day plan anytime you are unable to eat or drink as usual.  Do not use any tobacco products including cigarettes, chewing tobacco, or electronic cigarettes. If you need help quitting, ask your health care provider.  Limit alcohol intake to no more than 1 drink per day for nonpregnant women and 2 drinks per day for men. You should drink alcohol only when you are also eating food. Talk with your health care provider whether alcohol is safe   for you. Tell your health care provider if you drink alcohol several times a week.  Keep all follow-up visits as directed by your health care provider. This is important.  Schedule an eye exam soon after the diagnosis of type 2 diabetes and then annually.  Perform daily skin and foot care. Examine your skin and feet daily for cuts, bruises, redness, nail problems, bleeding, blisters, or sores. A foot exam by a health care provider should be done annually.  Brush your teeth and gums at least twice a day and floss at least once a day. Follow up with your dentist regularly.  Share your diabetes management plan with your workplace or  school.  Keep your immunizations up to date. It is recommended that you receive a flu (influenza) vaccine every year. It is also recommended that you receive a pneumonia (pneumococcal) vaccine. If you are 65 years of age or older and have never received a pneumonia vaccine, this vaccine may be given as a series of two separate shots. Ask your health care provider which additional vaccines may be recommended.  Learn to manage stress.  Obtain ongoing diabetes education and support as needed.  Participate in or seek rehabilitation as needed to maintain or improve independence and quality of life. Request a physical or occupational therapy referral if you are having foot or hand numbness, or difficulties with grooming, dressing, eating, or physical activity. SEEK MEDICAL CARE IF:   You are unable to eat food or drink fluids for more than 6 hours.  You have nausea and vomiting for more than 6 hours.  Your blood glucose level is over 240 mg/dL.  There is a change in mental status.  You develop an additional serious illness.  You have diarrhea for more than 6 hours.  You have been sick or have had a fever for a couple of days and are not getting better.  You have pain during any physical activity.  SEEK IMMEDIATE MEDICAL CARE IF:  You have difficulty breathing.  You have moderate to large ketone levels.   This information is not intended to replace advice given to you by your health care provider. Make sure you discuss any questions you have with your health care provider.   Document Released: 03/26/2005 Document Revised: 12/15/2014 Document Reviewed: 10/23/2011 Elsevier Interactive Patient Education 2016 [employer]     IF you received an x-ray today, you will receive an invoice from [facility]. Please contact [facility] at [phone] with questions or concerns regarding your invoice.   IF you received labwork today, you will receive an invoice from  [facility]. Please contact [facility] at [phone] with questions or concerns regarding your invoice.   Our billing staff will not be able to assist you with questions regarding bills from these companies.  You will be contacted with the lab results as soon as they are available. The fastest way to get your results is to activate your My Chart account. Instructions are located on the last page of this paperwork. If you have not heard from us regarding the results in 2 weeks, please contact this office.      

## 2015-09-17 ENCOUNTER — Encounter: Payer: Self-pay | Admitting: Urgent Care

## 2015-09-20 ENCOUNTER — Ambulatory Visit (INDEPENDENT_AMBULATORY_CARE_PROVIDER_SITE_OTHER): Payer: Medicare HMO | Admitting: Family Medicine

## 2015-09-20 ENCOUNTER — Ambulatory Visit (INDEPENDENT_AMBULATORY_CARE_PROVIDER_SITE_OTHER): Payer: Medicare HMO

## 2015-09-20 VITALS — BP 124/76 | HR 98 | Temp 98.1°F | Resp 18 | Ht 68.0 in | Wt 187.0 lb

## 2015-09-20 DIAGNOSIS — E162 Hypoglycemia, unspecified: Secondary | ICD-10-CM | POA: Diagnosis not present

## 2015-09-20 DIAGNOSIS — R61 Generalized hyperhidrosis: Secondary | ICD-10-CM | POA: Diagnosis not present

## 2015-09-20 DIAGNOSIS — D638 Anemia in other chronic diseases classified elsewhere: Secondary | ICD-10-CM | POA: Diagnosis not present

## 2015-09-20 DIAGNOSIS — I2510 Atherosclerotic heart disease of native coronary artery without angina pectoris: Secondary | ICD-10-CM

## 2015-09-20 DIAGNOSIS — R634 Abnormal weight loss: Secondary | ICD-10-CM

## 2015-09-20 DIAGNOSIS — J449 Chronic obstructive pulmonary disease, unspecified: Secondary | ICD-10-CM | POA: Diagnosis not present

## 2015-09-20 DIAGNOSIS — Z125 Encounter for screening for malignant neoplasm of prostate: Secondary | ICD-10-CM | POA: Diagnosis not present

## 2015-09-20 DIAGNOSIS — K861 Other chronic pancreatitis: Secondary | ICD-10-CM | POA: Diagnosis not present

## 2015-09-20 DIAGNOSIS — Z794 Long term (current) use of insulin: Secondary | ICD-10-CM | POA: Diagnosis not present

## 2015-09-20 DIAGNOSIS — I10 Essential (primary) hypertension: Secondary | ICD-10-CM

## 2015-09-20 DIAGNOSIS — E1121 Type 2 diabetes mellitus with diabetic nephropathy: Secondary | ICD-10-CM | POA: Diagnosis not present

## 2015-09-20 LAB — COMPREHENSIVE METABOLIC PANEL
ALK PHOS: 75 U/L (ref 40–115)
ALT: 11 U/L (ref 9–46)
AST: 17 U/L (ref 10–35)
Albumin: 3.5 g/dL — ABNORMAL LOW (ref 3.6–5.1)
BUN: 14 mg/dL (ref 7–25)
CALCIUM: 8.6 mg/dL (ref 8.6–10.3)
CHLORIDE: 99 mmol/L (ref 98–110)
CO2: 26 mmol/L (ref 20–31)
Creat: 0.94 mg/dL (ref 0.70–1.18)
GLUCOSE: 180 mg/dL — AB (ref 65–99)
POTASSIUM: 4.4 mmol/L (ref 3.5–5.3)
Sodium: 135 mmol/L (ref 135–146)
Total Bilirubin: 0.6 mg/dL (ref 0.2–1.2)
Total Protein: 7.2 g/dL (ref 6.1–8.1)

## 2015-09-20 LAB — CBC
HEMATOCRIT: 29.9 % — AB (ref 38.5–50.0)
HEMOGLOBIN: 9.9 g/dL — AB (ref 13.2–17.1)
MCH: 27.6 pg (ref 27.0–33.0)
MCHC: 33.1 g/dL (ref 32.0–36.0)
MCV: 83.3 fL (ref 80.0–100.0)
MPV: 9.8 fL (ref 7.5–12.5)
Platelets: 383 10*3/uL (ref 140–400)
RBC: 3.59 MIL/uL — AB (ref 4.20–5.80)
RDW: 15.2 % — ABNORMAL HIGH (ref 11.0–15.0)
WBC: 9.3 10*3/uL (ref 3.8–10.8)

## 2015-09-20 LAB — GLUCOSE, POCT (MANUAL RESULT ENTRY): POC GLUCOSE: 198 mg/dL — AB (ref 70–99)

## 2015-09-20 LAB — LIPID PANEL
CHOL/HDL RATIO: 1.8 ratio (ref <=5.0)
CHOLESTEROL: 98 mg/dL — AB (ref 125–200)
HDL: 54 mg/dL (ref >=40)
LDL CALC: 34 mg/dL (ref <130)
TRIGLYCERIDES: 49 mg/dL (ref <150)
VLDL: 10 mg/dL (ref <30)

## 2015-09-20 LAB — LIPASE: Lipase: 7 U/L (ref 7–60)

## 2015-09-20 LAB — TSH: TSH: 1.86 m[IU]/L (ref 0.40–4.50)

## 2015-09-20 IMAGING — DX DG CHEST 2V
2 series · 2 of 2 positions shown · non-contrast
Comparison: [DATE]

CLINICAL DATA: Night sweats and loss of weight

EXAM:
CHEST  2 VIEW

[chest pa]
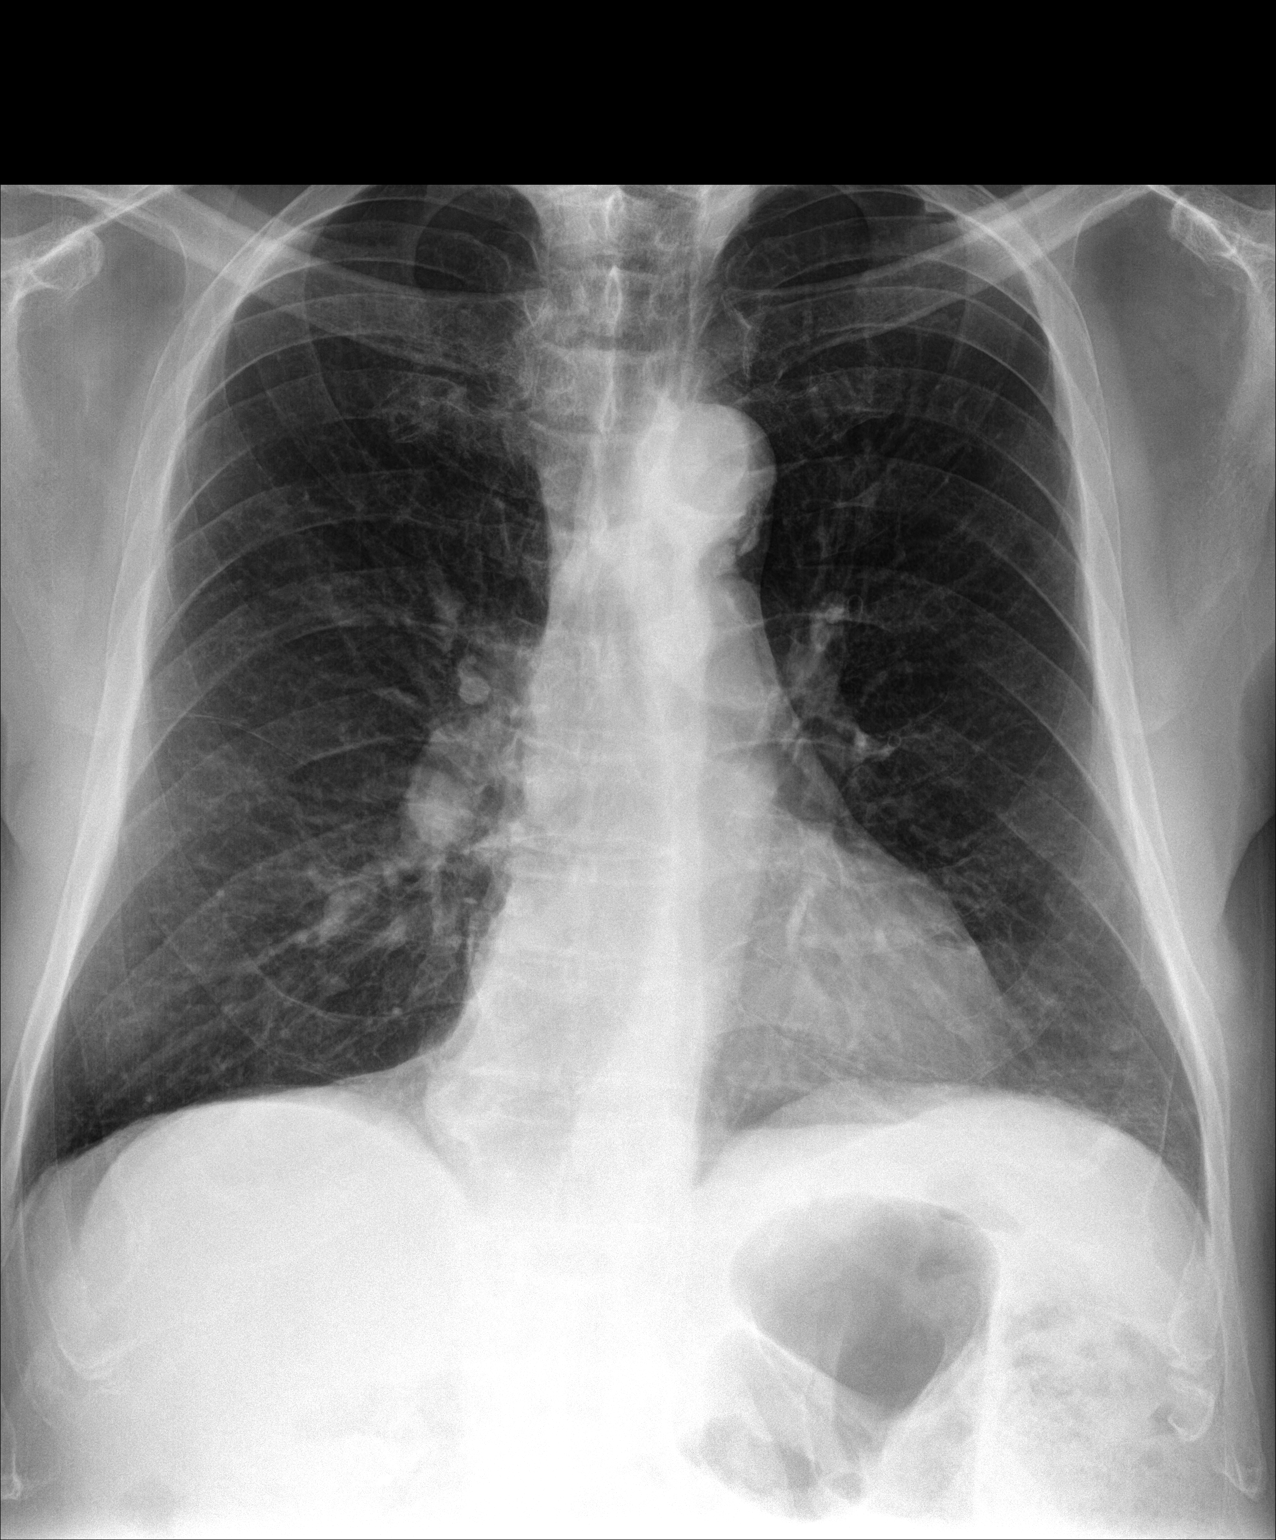

[chest lat]
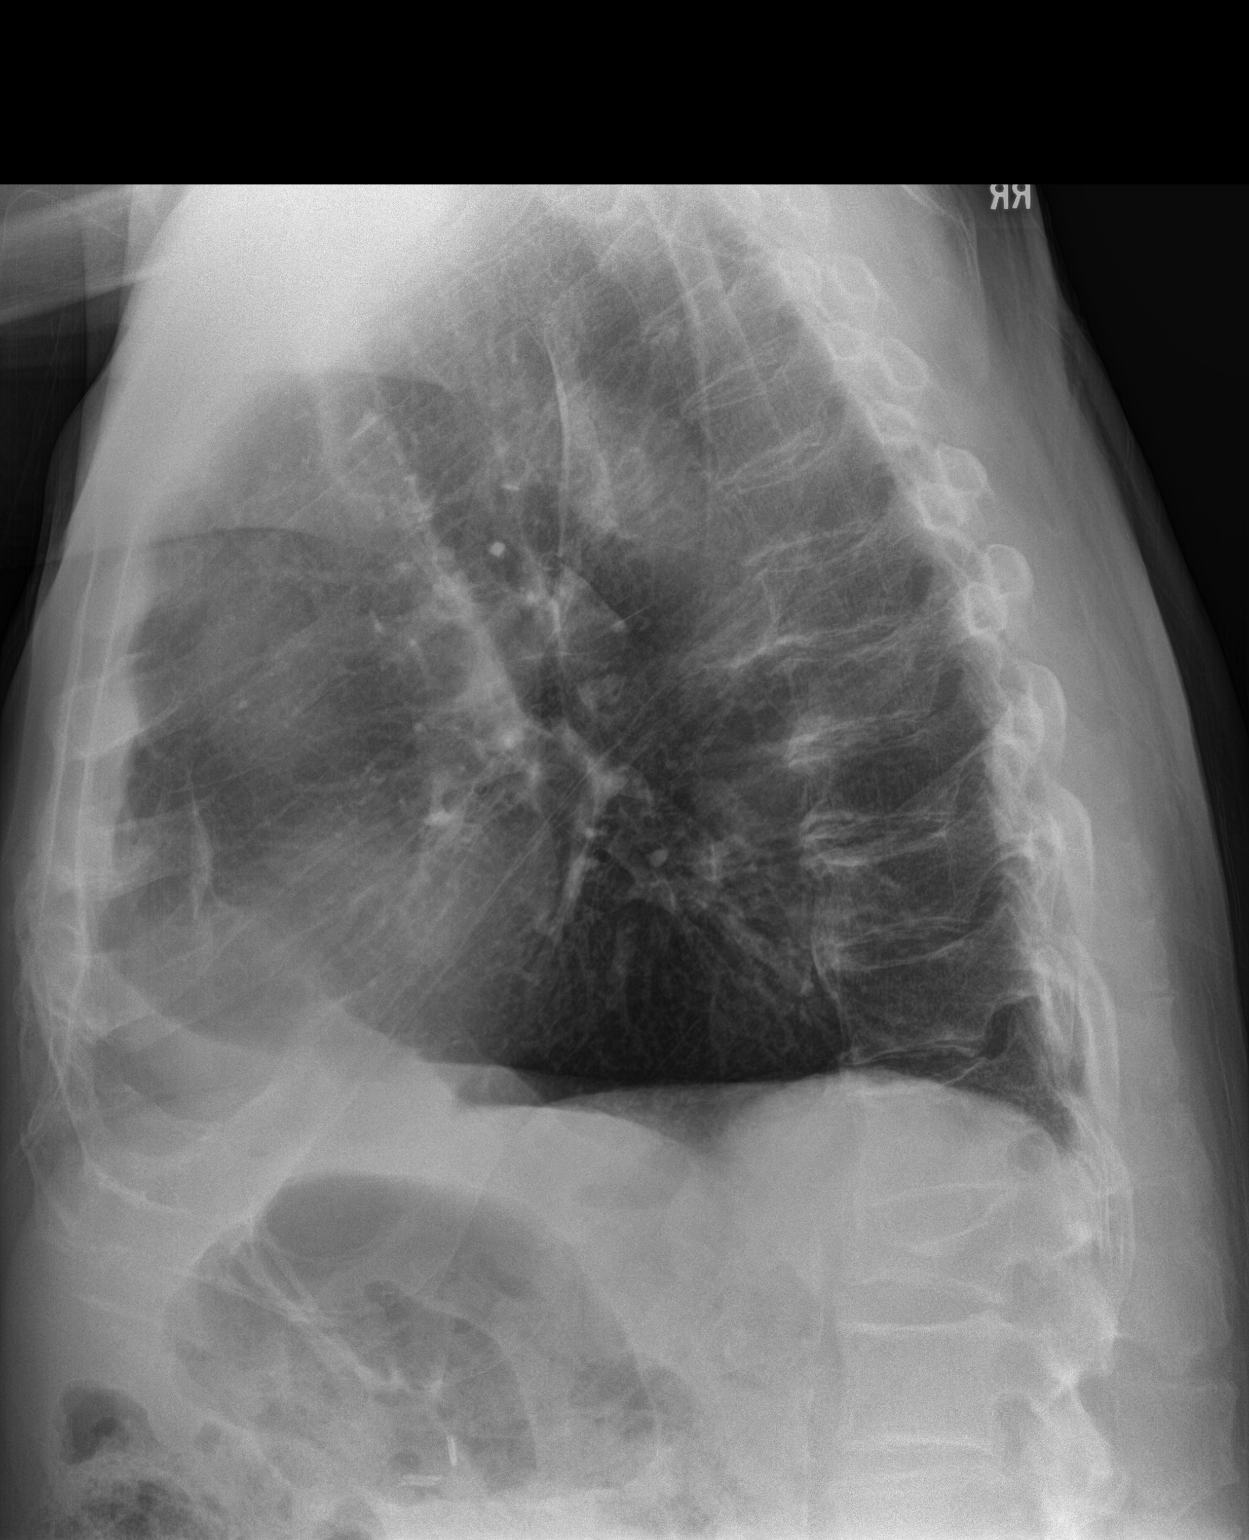

[2 of 2 positions shown; findings below may reference images not displayed]

FINDINGS: The heart size and mediastinal contours are within normal limits.
Both lungs are hyperinflated. The visualized skeletal structures
shows stable compression deformity near the thoracolumbar junction.
IMPRESSION: No acute abnormality noted.

COPD.

## 2015-09-20 MED ORDER — ALBUTEROL SULFATE (2.5 MG/3ML) 0.083% IN NEBU
2.5000 mg | INHALATION_SOLUTION | Freq: Once | RESPIRATORY_TRACT | Status: AC
  Administered 2015-09-20: 2.5 mg via RESPIRATORY_TRACT

## 2015-09-20 MED ORDER — SPACER/AERO-HOLDING CHAMBERS DEVI: Status: DC

## 2015-09-20 MED ORDER — IPRATROPIUM-ALBUTEROL 20-100 MCG/ACT IN AERS: 1.0000 | INHALATION_SPRAY | Freq: Four times a day (QID) | RESPIRATORY_TRACT | Status: DC

## 2015-09-20 MED ORDER — IPRATROPIUM BROMIDE 0.02 % IN SOLN
0.5000 mg | Freq: Once | RESPIRATORY_TRACT | Status: AC
  Administered 2015-09-20: 0.5 mg via RESPIRATORY_TRACT

## 2015-09-20 MED ORDER — INSULIN GLARGINE 100 UNIT/ML SOLOSTAR PEN: 45.0000 [IU] | PEN_INJECTOR | Freq: Two times a day (BID) | SUBCUTANEOUS | Status: DC

## 2015-09-20 NOTE — Patient Instructions (Addendum)
IF you received an x-ray today, you will receive an invoice from [facility]. Please contact [facility] at [phone] with questions or concerns regarding your invoice.   IF you received labwork today, you will receive an invoice from [employer]. Please contact [facility] at [phone] with questions or concerns regarding your invoice.   Our billing staff will not be able to assist you with questions regarding bills from these companies.  You will be contacted with the lab results as soon as they are available. The fastest way to get your results is to activate your My Chart account. Instructions are located on the last page of this paperwork. If you have not heard from [place] regarding the results in 2 weeks, please contact this office.    Hypoglycemia Hypoglycemia occurs when the glucose in your blood is too low. Glucose is a type of sugar that is your body's main energy source. Hormones, such as insulin and glucagon, control the level of glucose in the blood. Insulin lowers blood glucose and glucagon increases blood glucose. Having too much insulin in your blood stream, or not eating enough food containing sugar, can result in hypoglycemia. Hypoglycemia can happen to people with or without diabetes. It can develop quickly and can be a medical emergency.  CAUSES   Missing or delaying meals.  Not eating enough carbohydrates at meals.  Taking too much diabetes medicine.  Not timing your oral diabetes medicine or insulin doses with meals, snacks, and exercise.  Nausea and vomiting.  Certain medicines.  Severe illnesses, such as hepatitis, kidney disorders, and certain eating disorders.  Increased activity or exercise without eating something extra or adjusting medicines.  Drinking too much alcohol.  A nerve disorder that affects body functions like your heart rate, blood pressure, and digestion (autonomic neuropathy).  A condition  where the stomach muscles do not function properly (gastroparesis). Therefore, medicines and food may not absorb properly.  Rarely, a tumor of the pancreas can produce too much insulin. SYMPTOMS   Hunger.  Sweating (diaphoresis).  Change in body temperature.  Shakiness.  Headache.  Anxiety.  Lightheadedness.  Irritability.  Difficulty concentrating.  Dry mouth.  Tingling or numbness in the hands or feet.  Restless sleep or sleep disturbances.  Altered speech and coordination.  Change in mental status.  Seizures or prolonged convulsions.  Combativeness.  Drowsiness (lethargic).  Weakness.  Increased heart rate or palpitations.  Confusion.  Pale, gray skin color.  Blurred or double vision.  Fainting. DIAGNOSIS  A physical exam and medical history will be performed. Your caregiver may make a diagnosis based on your symptoms. Blood tests and other lab tests may be performed to confirm a diagnosis. Once the diagnosis is made, your caregiver will see if your signs and symptoms go away once your blood glucose is raised.  TREATMENT  Usually, you can easily treat your hypoglycemia when you notice symptoms.  Check your blood glucose. If it is less than 70 mg/dl, take one of the following:   3-4 glucose tablets.    cup juice.    cup regular soda.   1 cup skim milk.   -1 tube of glucose gel.   5-6 hard candies.   Avoid high-fat drinks or food that may delay a rise in blood glucose levels.  Do not take more than the recommended amount of sugary foods, drinks, gel, or tablets. Doing so will cause your blood glucose to go too high.   Wait 10-15 minutes  and recheck your blood glucose. If it is still less than 70 mg/dl or below your target range, repeat treatment.   Eat a snack if it is more than 1 hour until your next meal.  There may be a time when your blood glucose may go so low that you are unable to treat yourself at home when you start to  notice symptoms. You may need someone to help you. You may even faint or be unable to swallow. If you cannot treat yourself, someone will need to bring you to the hospital.  [place]  If you have diabetes, follow your diabetes management plan by:  Taking your medicines as directed.  Following your exercise plan.  Following your meal plan. Do not skip meals. Eat on time.  Testing your blood glucose regularly. Check your blood glucose before and after exercise. If you exercise longer or different than usual, be sure to check blood glucose more frequently.  Wearing your medical alert jewelry that says you have diabetes.  Identify the cause of your hypoglycemia. Then, develop ways to prevent the recurrence of hypoglycemia.  Do not take a hot bath or shower right after an insulin shot.  Always carry treatment with you. Glucose tablets are the easiest to carry.  If you are going to drink alcohol, drink it only with meals.  Tell friends or family members ways to keep you safe during a seizure. This may include removing hard or sharp objects from the area or turning you on your side.  Maintain a healthy weight. SEEK MEDICAL CARE IF:   You are having problems keeping your blood glucose in your target range.  You are having frequent episodes of hypoglycemia.  You feel you might be having side effects from your medicines.  You are not sure why your blood glucose is dropping so low.  You notice a change in vision or a new problem with your vision. SEEK IMMEDIATE MEDICAL CARE IF:   Confusion develops.  A change in mental status occurs.  The inability to swallow develops.  Fainting occurs.   This information is not intended to replace advice given to you by your health care provider. Make sure you discuss any questions you have with your health care provider.   Document Released: 03/26/2005 Document Revised: 03/31/2013 Document Reviewed: 11/30/2014 Elsevier  Interactive Patient Education 2016 [place]. Insulin Treatment for Diabetes Diabetes is a disease that does not go away (chronic). It occurs when the body does not properly use the sugar (glucose) that is released from food after it is digested. Glucose levels are controlled by a hormone called insulin, which is made by your pancreas. Depending on the type of diabetes you have, either of the following will apply:   The pancreas does not make any insulin (type 1 diabetes).  The pancreas makes too little insulin, and the body cannot respond normally to the insulin that is made (type 2 diabetes). Without insulin, death can occur. However, with the addition of insulin, blood sugar monitoring, and treatment, someone with diabetes can live a full and productive life. This document will discuss the role of insulin in your treatment and provide information about its use.  HOW IS INSULIN GIVEN? Insulin is a medicine that can only be given by injection. Taking it by mouth makes it inactive because of the acid in your stomach. Insulin is injected under the skin by a syringe and needle, an insulin pen, a pump, or a jet injector. Your dose  will be determined by your health care provider based on your individual needs. You will also be given guidance on which method of giving insulin is right for you. Remember that if you give insulin with a needle and syringe, you must do so using only a special insulin syringe made for this purpose. WHERE ON THE BODY SHOULD INSULIN BE INJECTED? Insulin is injected into the fatty layer of tissue just under your skin. Good places to inject insulin include the upper arm, the front and outer area of the thigh, the hips, and the abdomen. Giving your insulin in the abdomen is preferred because this provides the most rapid and consistent absorption. Avoid the area 2 inches (5 cm) around the navel and avoid injecting into areas on your body with scar tissue. In addition, it is important  to rotate your injection sites with every shot to prevent irritation and improve absorption. WHAT ARE THE DIFFERENT TYPES OF INSULIN?  If you have type 1 diabetes, you must take insulin to stay alive. Your body does not produce it. If you have type 2 diabetes, you might require insulin in addition to, or instead of, other medicines. In either case, proper use of insulin is critical to control your diabetes.  There are a number of different types of insulin. Usually, you will give yourself injections, though others can be trained to give them to you. Some people have an insulin pump that delivers insulin continuously through a tube (cannula) that is placed under the skin. Using insulin requires that you check your blood sugar several times a day. The exact number of times and time of day to check will vary depending on your type of diabetes, your type of insulin, and treatment goals. Your health care provider will direct you.  Generally, different insulins have different properties. The following is a general guide. Specifics will vary by product, and new products are introduced periodically.   Rapid-acting insulin starts working quickly (in as little as 5 minutes) and wears off in 4 to 6 hours (sometimes longer). This type of insulin works well when taken just before a meal to bring your blood sugar quickly back to normal.   Short-acting insulin starts working in about 30 minutes and can last 6 to 10 hours. This type of insulin should be taken about 30 minutes before you start eating a meal.  Intermediate-acting insulin starts working in 1-2 hours and wears off after about 10 to 18 hours. This insulin will lower your blood sugar for a longer period of time, but it will not be as effective in lowering your blood sugar right after a meal.   Long-acting insulin mimics the small amount of insulin that your pancreas usually produces throughout the day. You need to have some insulin present at all times.  It is crucial to the metabolism of brain cells and other cells. Long-acting insulin is meant to be used either once or twice a day. It is usually used in combination with other types of insulin, or in combination with other diabetes medicines.  Discuss the type of insulin you are taking with your health care provider or pharmacist. You will then be aware of when the insulin can be expected to peak and when it will wear off. This is important to know so you can plan for meal times and periods of exercise.  Your health care provider will usually have a strategy in mind when treating you with insulin. This will vary with your type of  diabetes, your diabetes treatment goals, and your health history. It is important that you understand this strategy so you can be an active partner in treating your diabetes. Here are some terms you might hear:   Basal insulin. This refers to the small amount of insulin that needs to be present in your blood at all times. Sometimes oral medicines will be enough. For other people, and especially for people with type 1 diabetes, insulin is needed. Usually, intermediate-acting or long-acting insulin is used once or twice a day to accomplish this.   Prandial (meal-related) insulin. Your blood sugar will rise rapidly after a meal. Rapid-acting or short-acting insulin can be used right before the meal to bring your blood sugar back to normal quickly. You might be instructed to adjust the amount of insulin depending on how much carbohydrate (starch) is in your meal.   Corrective insulin. You might be instructed to check your blood sugar at certain times of the day. You then might use a small amount of rapid-acting or short-acting insulin to bring the blood sugar down to normal if it is elevated.   Tight control (also called intensive therapy). Tight control means keeping your blood sugar as close to your target as possible and keeping it from going too high after meals. People with  tight control of their diabetes are shown to have fewer long-term problems from their diabetes.   Glycohemoglobin (also called glyco, glycosylated hemoglobin, hemoglobin A1c, or A1c) level. This measures how well your blood sugar has been controlled during the past 1 to 3 months. It helps your health care provider see how effective your treatment is and decide if any changes are needed. Your health care provider will discuss your target glycohemoglobin level with you.  Insulin treatment requires your careful attention. While you are being treated with insulin, you should check your blood glucose at least two times each day. Treatment plans will be different for different people. Some people do well with a simple program. Others require more complicated programs, with multiple insulin injections daily. You will work with your health care provider to develop the best program for you. Regardless of your insulin treatment plan, you must also do your best on weight control, diet and food choices, exercise, blood pressure control, cholesterol control, and stress levels.  WHAT ARE THE SIDE EFFECTS OF INSULIN? Although insulin treatment is important, it does have some side effects, such as:   Insulin can cause your blood sugar to go too low (hypoglycemia).   Weight gain can occur.   Improper injection technique can cause hypoglycemia, blood sugar to go too high (hyperglycemia), skin injury or irritation, or other problems. You must learn to inject insulin properly.   This information is not intended to replace advice given to you by your health care provider. Make sure you discuss any questions you have with your health care provider.   Document Released: 06/22/2008 Document Revised: 04/16/2014 Document Reviewed: 09/07/2012 Elsevier Interactive Patient Education [employer].

## 2015-09-20 NOTE — Progress Notes (Signed)
By signing my name below, I, Mesha Guinyard, attest that this documentation has been prepared under the direction and in the presence of Delman Cheadle, MD.  Electronically Signed: Verlee Monte, Medical Scribe. 09/20/2015. 9:22 AM.  Subjective:    Patient ID: Justin [name], male    DOB: 1934/10/07, 81 y.o.   MRN: [id number]  HPI Chief Complaint  Patient presents with  . Follow-up    HPI Comments: Justin [name] is a 81 y.o. male who presents to the [facility] for a follow-up. Pt has insultin dependant DM with a hx of night sweats which was suspected to be due to hypoglycemia. He has had trouble afforing his long acting insulin. Was seen 2 weeks ago for edema and numbness of right hand- worse upon waking up in the morning. Pt was seen 1 week ago for follow-up with Manny. Pt does well with 48-50 unit of lantis BID. He didn't have his medications for review, but he had daughter along with CMA to show compliance.  Pt has been hypoglycemic 3 times in the last month- readings at 100. Pt doesn't check his blood sugars every day because he's busy taking care of his wife. Pt still takes metformin. Pt hasn't taken his testosterone injections in about 1.5 years. Pt takes omprazole for his abdominal pains BID. Pt states he takes all his medications in the morning. Pt states he doesn't get hungry anymore. In the morning he occasionally drinks ginger ale or Sunny-D. In the morning he'll eat a banana, or peanutbutter and crackers. Last night he ate boiled ham with a salad. Pt has been fasting since last night. Pt doesn't see any doctor regularly.  Pt states he was winded when walking back from X-Ray.  Pt has complained of cold type symptoms that are mainly resolving.  Patient Active Problem List   Diagnosis Date Noted  . Arthritis 09/06/2015  . Diabetic peripheral neuropathy ([place]) 08/17/2015  . OA (osteoarthritis) of knee 08/17/2015  . Hammer toe, acquired 09/30/2013  . Personal  history of colonic adenomas 04/15/2013  . Chronic pancreatitis ([place]) 02/26/2013  . Anemia of chronic disease 02/26/2013  . Special screening for malignant neoplasms, colon 02/26/2013  . Essential hypertension, benign 12/14/2012  . Testosterone deficiency 10/22/2011  . Headache above the eye region 02/22/2011  . Type 2 diabetes mellitus with diabetic nephropathy, with long-term current use of insulin ([place]) 02/22/2011  . TIA on medication 02/22/2011  . Coronary artery disease 02/22/2011   Past Medical History  Diagnosis Date  . HTN (hypertension)   . Diabetes mellitus   . Hypercholesteremia   . TIA (transient ischemic attack)   . Iron deficiency   . Arthritis   . Stroke ([place])   . Pancreatitis, acute 06/02/2012    Diagnosed 05/2012   . Personal history of colonic adenomas 04/15/2013    04/15/2013 2 diminutive polyps     Past Surgical History  Procedure Laterality Date  . Back surgery  1985  . Neck surgery  1985  . Temporal artery biopsy / ligation  02/26/2011    left side  . Artery biopsy  02/26/2011    Procedure: BIOPSY TEMPORAL ARTERY;  Surgeon: Willey Blade, MD;  Location: [facility];  Service: General;  Laterality: Left;  Left temporal artery biospy  . Cholecystectomy    . Colonoscopy    . Upper gastrointestinal endoscopy    . Eus     No Known Allergies Prior to Admission medications  Medication Sig Start Date End Date Taking? Authorizing Provider  Alcohol Swabs PADS Use to check blood sugar 3 times daily. Dx: E10.9 07/21/15  Yes Stephanie D English, PA  atorvastatin (LIPITOR) 40 MG tablet TAKE 1 TABLET EVERY DAY 07/21/15  Yes Dorian Heckle English, PA  Blood Glucose Calibration (ACCU-CHEK AVIVA) SOLN Use to check blood sugar 3 times daily. Dx: E10.9 07/21/15  Yes Dorian Heckle English, PA  clopidogrel (PLAVIX) 75 MG tablet TAKE 1 TABLET EVERY DAY 02/21/15  Yes Dorian Heckle English, PA  CREON 12000 units CPEP capsule TAKE TWO CAPSULES BY MOUTH THREE TIMES DAILY  WITH MEALS 08/11/15  Yes Shawnee Knapp, MD  fish oil-omega-3 fatty acids 1000 MG capsule Take 2 g by mouth daily.     Yes Historical Provider, MD  glucose blood (ACCU-CHEK AVIVA PLUS) test strip Use to check blood sugar 3 times daily. Dx: E10.9 07/21/15  Yes Stephanie D English, PA  Insulin Glargine (LANTUS SOLOSTAR) 100 UNIT/ML Solostar Pen Inject 48 Units into the skin 2 (two) times daily. Bid 09/06/15  Yes Shawnee Knapp, MD  Insulin Pen Needle 32G X 4 MM MISC Use as directed to inject insulin tidwc 12/14/12  Yes Shawnee Knapp, MD  l-methylfolate-B6-B12 [facility]) 3-35-2 MG TABS Take 1 tablet by mouth daily.     Yes Historical Provider, MD  Lancets (ACCU-CHEK SOFT TOUCH) lancets Use to check blood sugar 3 times daily. Dx: E10.9 07/21/15  Yes Stephanie D English, PA  metFORMIN (GLUCOPHAGE) 1000 MG tablet TAKE 1 TABLET TWICE DAILY WITH MEALS 09/06/15  Yes Shawnee Knapp, MD  Multiple Vitamins-Minerals (MULTIVITAMIN WITH MINERALS) tablet Take 1 tablet by mouth daily.     Yes Historical Provider, MD  omeprazole (PRILOSEC) 40 MG capsule TAKE 1 CAPSULE EVERY DAY 02/21/15  Yes Joretta Bachelor, PA   Social History   Social History  . Marital Status: Married    Spouse Name: N/A  . Number of Children: 6  . Years of Education: N/A   Occupational History  . Not on file.   Social History Main Topics  . Smoking status: Former Smoker -- 35 years    Types: Cigarettes    Quit date: 04/09/1990  . Smokeless tobacco: Never Used  . Alcohol Use: No     Comment: "per pt stopped drinking in the 90's"  . Drug Use: No  . Sexual Activity: Not on file   Other Topics Concern  . Not on file   Social History Narrative   Depression screen [facility] 2/9 09/20/2015 09/15/2015 09/06/2015 06/29/2015 05/20/2015  Decreased Interest 0 0 0 0 0  Down, Depressed, Hopeless 0 0 0 0 0  PHQ - 2 Score 0 0 0 0 0    Review of Systems  Constitutional: Positive for diaphoresis, appetite change and unexpected weight change. Negative for fever.  HENT:  Negative for rhinorrhea and sore throat.   Respiratory: Negative for cough and shortness of breath.   Gastrointestinal: Negative for diarrhea and constipation.  Genitourinary: Negative for dysuria, hematuria and difficulty urinating.  Musculoskeletal: Negative for myalgias, back pain, arthralgias and neck pain.  Neurological: Negative for tremors, weakness and numbness.    Objective:  BP 124/76 mmHg  Pulse 98  Temp(Src) 98.1 F (36.7 C) (Oral)  Resp 18  Ht 5\' 8"  (1.727 m)  Wt 187 lb (84.823 kg)  BMI 28.44 kg/m2  SpO2 97%  Physical Exam  Constitutional: He appears well-developed and well-nourished. No distress.  HENT:  Head: Normocephalic and  atraumatic.  Eyes: Conjunctivae are normal.  Neck: Neck supple.  Cardiovascular: Regular rhythm, S1 normal and S2 normal.  Tachycardia present.   Pulmonary/Chest: Effort normal.  Decreased air movement with inspiratory rhonchi  Neurological: He is alert.  Skin: Skin is warm and dry.  Psychiatric: He has a normal mood and affect. His behavior is normal.  Nursing note and vitals reviewed.  Results for orders placed or performed in visit on 09/20/15  POCT glucose (manual entry)  Result Value Ref Range   POC Glucose 198 (A) 70 - 99 mg/dl   EKG Reading: NL Sinus Rhythm; no acute ischemic changes.  Dg Chest 2 View  09/20/2015  CLINICAL DATA:  Night sweats and loss of weight EXAM: CHEST  2 VIEW COMPARISON:  12/31/2013 FINDINGS: The heart size and mediastinal contours are within normal limits. Both lungs are hyperinflated. The visualized skeletal structures shows stable compression deformity near the thoracolumbar junction. IMPRESSION: No acute abnormality noted. COPD. Electronically Signed   By: Inez Catalina M.D.   On: 09/20/2015 10:28   10:54 AM: Pt states he feels better after the duo neb treatment. Good air movement, lungs clear.  Assessment & Plan:   1. Night sweats - only during hypoglycemic episodes  2. Loss of weight - pt thinks  eating less with recent URI (resolving), cont to monitor while making sure cancer screening is UTD, cxr and psa today, do home HOC.  Recheck 1 wk.  3. Hypoglycemia   4. Type 2 diabetes mellitus with diabetic nephropathy, with long-term current use of insulin ([name]) - decrease lantus from 48u bid to 45u bid. Cont metformin  5. Chronic pancreatitis, unspecified pancreatitis type ([place]) - had to go off creon sev mos ago due to cost.  6. Essential hypertension, benign   7. Coronary artery disease involving native coronary artery of native heart without angina pectoris   8. Anemia of chronic disease   9. Screening for prostate cancer   10. Chronic obstructive pulmonary disease, unspecified COPD type ([place])     Orders Placed This Encounter  Procedures  . DG Chest 2 View    Standing Status: Future     Number of Occurrences: 1     Standing Expiration Date: 09/19/2016    Order Specific Question:  Reason for Exam (SYMPTOM  OR DIAGNOSIS REQUIRED)    Answer:  night sweats, weight loss, smoking hx    Order Specific Question:  Preferred imaging location?    Answer:  External  . CBC  . Lipid panel    Order Specific Question:  Has the patient fasted?    Answer:  Yes  . Comprehensive metabolic panel    Order Specific Question:  Has the patient fasted?    Answer:  Yes  . Lipase  . PSA  . TSH  . POCT glucose (manual entry)  . EKG 12-Lead    Meds ordered this encounter  Medications  . Insulin Glargine (LANTUS SOLOSTAR) 100 UNIT/ML Solostar Pen    Sig: Inject 45 Units into the skin 2 (two) times daily. Bid    Dispense:  5 pen    Refill:  1    Ok to change to basilgar, same dosing and sig, if cheaper from lantus  . albuterol (PROVENTIL) (2.5 MG/3ML) 0.083% nebulizer solution 2.5 mg    Sig:   . ipratropium (ATROVENT) nebulizer solution 0.5 mg    Sig:   . Ipratropium-Albuterol (COMBIVENT) 20-100 MCG/ACT AERS respimat    Sig: Inhale 1 puff into the lungs  every 6 (six) hours.    Dispense:  4 g     Refill:  3  . Spacer/Aero-Holding Chambers DEVI    Sig: Use as directed with combivent. Ok to dispense any spacer that works with the inhaler being rxed to pt.    Dispense:  1 each    Refill:  1    I personally performed the services described in this documentation, which was scribed in my presence. The recorded information has been reviewed and considered, and addended by me as needed.   Delman Cheadle, M.D.  [facility] [address] [place], [place] [address] [phone] phone [phone] fax  09/28/2015 2:33 PM

## 2015-09-21 ENCOUNTER — Telehealth: Payer: Self-pay | Admitting: Emergency Medicine

## 2015-09-21 LAB — PSA: PSA: 1.34 ng/mL (ref <=4.00)

## 2015-09-21 NOTE — Telephone Encounter (Signed)
One contact number disconnected and the other # no VM Will send lab letter

## 2015-09-21 NOTE — Telephone Encounter (Signed)
-----   Message from Shawnee Knapp, MD sent at 09/21/2015 12:53 AM EDT ----- Please have pt or his daughter pick up a set of 3 home hemoccult cards for him to complete.  He is anemic - low on red blood cells - and this has gotten gradually worse over the past 2 years - might have to do with his current symptoms so need to check to see if he is loosing blood in his stool.

## 2015-09-22 ENCOUNTER — Other Ambulatory Visit: Payer: Self-pay | Admitting: Family Medicine

## 2015-09-25 ENCOUNTER — Other Ambulatory Visit: Payer: Self-pay

## 2015-09-25 ENCOUNTER — Encounter (HOSPITAL_BASED_OUTPATIENT_CLINIC_OR_DEPARTMENT_OTHER): Payer: Self-pay | Admitting: *Deleted

## 2015-09-25 ENCOUNTER — Emergency Department (HOSPITAL_BASED_OUTPATIENT_CLINIC_OR_DEPARTMENT_OTHER)
Admission: EM | Admit: 2015-09-25 | Discharge: 2015-09-25 | Disposition: A | Discharge status: Home / Self Care | Payer: Medicare HMO | Attending: Emergency Medicine | Admitting: Emergency Medicine

## 2015-09-25 DIAGNOSIS — Z87891 Personal history of nicotine dependence: Secondary | ICD-10-CM | POA: Insufficient documentation

## 2015-09-25 DIAGNOSIS — Z794 Long term (current) use of insulin: Secondary | ICD-10-CM | POA: Insufficient documentation

## 2015-09-25 DIAGNOSIS — E11649 Type 2 diabetes mellitus with hypoglycemia without coma: Secondary | ICD-10-CM | POA: Insufficient documentation

## 2015-09-25 DIAGNOSIS — Z79899 Other long term (current) drug therapy: Secondary | ICD-10-CM | POA: Insufficient documentation

## 2015-09-25 DIAGNOSIS — Z7984 Long term (current) use of oral hypoglycemic drugs: Secondary | ICD-10-CM | POA: Diagnosis not present

## 2015-09-25 DIAGNOSIS — I10 Essential (primary) hypertension: Secondary | ICD-10-CM | POA: Insufficient documentation

## 2015-09-25 DIAGNOSIS — Z8673 Personal history of transient ischemic attack (TIA), and cerebral infarction without residual deficits: Secondary | ICD-10-CM | POA: Insufficient documentation

## 2015-09-25 DIAGNOSIS — M1990 Unspecified osteoarthritis, unspecified site: Secondary | ICD-10-CM | POA: Insufficient documentation

## 2015-09-25 DIAGNOSIS — E162 Hypoglycemia, unspecified: Secondary | ICD-10-CM

## 2015-09-25 LAB — CBC WITH DIFFERENTIAL/PLATELET
BASOS ABS: 0.1 10*3/uL (ref 0.0–0.1)
BASOS PCT: 1 %
Eosinophils Absolute: 0.3 10*3/uL (ref 0.0–0.7)
Eosinophils Relative: 3 %
HEMATOCRIT: 31 % — AB (ref 39.0–52.0)
Hemoglobin: 10 g/dL — ABNORMAL LOW (ref 13.0–17.0)
Lymphocytes Relative: 16 %
Lymphs Abs: 1.4 10*3/uL (ref 0.7–4.0)
MCH: 27.9 pg (ref 26.0–34.0)
MCHC: 32.3 g/dL (ref 30.0–36.0)
MCV: 86.6 fL (ref 78.0–100.0)
MONO ABS: 0.7 10*3/uL (ref 0.1–1.0)
Monocytes Relative: 8 %
NEUTROS ABS: 6.8 10*3/uL (ref 1.7–7.7)
Neutrophils Relative %: 72 %
PLATELETS: 318 10*3/uL (ref 150–400)
RBC: 3.58 MIL/uL — ABNORMAL LOW (ref 4.22–5.81)
RDW: 14 % (ref 11.5–15.5)
WBC: 9.3 10*3/uL (ref 4.0–10.5)

## 2015-09-25 LAB — BASIC METABOLIC PANEL
ANION GAP: 8 (ref 5–15)
BUN: 17 mg/dL (ref 6–20)
CHLORIDE: 101 mmol/L (ref 101–111)
CO2: 26 mmol/L (ref 22–32)
Calcium: 9.1 mg/dL (ref 8.9–10.3)
Creatinine, Ser: 0.97 mg/dL (ref 0.61–1.24)
GFR calc non Af Amer: >60 mL/min (ref >60)
Glucose, Bld: 142 mg/dL — ABNORMAL HIGH (ref 65–99)
POTASSIUM: 4.6 mmol/L (ref 3.5–5.1)
SODIUM: 135 mmol/L (ref 135–145)

## 2015-09-25 LAB — URINE MICROSCOPIC-ADD ON: RBC / HPF: NONE SEEN RBC/hpf (ref 0–5)

## 2015-09-25 LAB — OCCULT BLOOD X 1 CARD TO LAB, STOOL: FECAL OCCULT BLD: NEGATIVE

## 2015-09-25 LAB — URINALYSIS, ROUTINE W REFLEX MICROSCOPIC
Bilirubin Urine: NEGATIVE
GLUCOSE, UA: NEGATIVE mg/dL
Hgb urine dipstick: NEGATIVE
KETONES UR: NEGATIVE mg/dL
LEUKOCYTES UA: NEGATIVE
NITRITE: NEGATIVE
PROTEIN: 100 mg/dL — AB
Specific Gravity, Urine: 1.015 (ref 1.005–1.030)
pH: 5.5 (ref 5.0–8.0)

## 2015-09-25 LAB — CBG MONITORING, ED
GLUCOSE-CAPILLARY: 155 mg/dL — AB (ref 65–99)
GLUCOSE-CAPILLARY: 195 mg/dL — AB (ref 65–99)
GLUCOSE-CAPILLARY: 173 mg/dL — AB (ref 65–99)
Glucose-Capillary: 85 mg/dL (ref 65–99)

## 2015-09-25 NOTE — ED Notes (Signed)
Pa  at bedside. 

## 2015-09-25 NOTE — ED Provider Notes (Signed)
CSN: [id number]     Arrival date & time 09/25/15  0850 History   First MD Initiated Contact with Patient 09/25/15 0911     Chief Complaint  Patient presents with  . Hypoglycemia     (Consider location/radiation/quality/duration/timing/severity/associated sxs/prior Treatment) HPI Justin [name] is a 81 y.o. male with PMH significant for HTN, DM, Hypercholesteremia, TIA, CVA, IDA who presents for evaluation of hypoglycemia this morning.  History provided by the patient and his daughter.  Approximately 7 AM this morning, patient reports he became dizzy and fell to the floor.  His daughter found him, checked his blood sugar, and found it to be 63.  He then states he fell down because his knee was giving him trouble. She gave him sugar and orange juice.  Patient denies any CP, SOB, LOC, head injury, lightheadedness, fever, cough, abdominal pain, N/V/D, or urinary symptoms.  He denies any preceding symptoms other than feeling dizzy.  Denies any pain at present.  He had not eaten anything this morning. Last Levemir dose last night. He normally takes 45U Levemir BID and 1000 mg Metformin BID.  This was recently changed 1 month ago, he was originally taking 50U Levemir BID; however, he has had multiple episodes like this one over the last month. He reports he has been eating less, will eat peanut butter crackers and ham roll ups.  He began taking Tramadol and Gabapentin approximately 1 month.  Past Medical History  Diagnosis Date  . HTN (hypertension)   . Diabetes mellitus   . Hypercholesteremia   . TIA (transient ischemic attack)   . Iron deficiency   . Arthritis   . Stroke ([place])   . Pancreatitis, acute 06/02/2012    Diagnosed 05/2012   . Personal history of colonic adenomas 04/15/2013    04/15/2013 2 diminutive polyps     Past Surgical History  Procedure Laterality Date  . Back surgery  1985  . Neck surgery  1985  . Temporal artery biopsy / ligation  02/26/2011    left side  . Artery biopsy   02/26/2011    Procedure: BIOPSY TEMPORAL ARTERY;  Surgeon: Willey Blade, MD;  Location: [facility];  Service: General;  Laterality: Left;  Left temporal artery biospy  . Cholecystectomy    . Colonoscopy    . Upper gastrointestinal endoscopy    . Eus     Family History  Problem Relation Age of Onset  . Heart disease Mother   . Diabetes Sister     x 2  . Heart attack Sister   . Amblyopia Father    Social History  Substance Use Topics  . Smoking status: Former Smoker -- 35 years    Types: Cigarettes    Quit date: 04/09/1990  . Smokeless tobacco: Never Used  . Alcohol Use: No     Comment: "per pt stopped drinking in the 90's"    Review of Systems All other systems negative unless otherwise stated in HPI    Allergies  Review of patient's allergies indicates no known allergies.  Home Medications   Prior to Admission medications   Medication Sig Start Date End Date Taking? Authorizing Provider  Alcohol Swabs PADS Use to check blood sugar 3 times daily. Dx: E10.9 07/21/15   Dorian Heckle English, PA  atorvastatin (LIPITOR) 40 MG tablet TAKE 1 TABLET EVERY DAY 07/21/15   Dorian Heckle English, PA  Blood Glucose Calibration (ACCU-CHEK AVIVA) SOLN Use to check blood sugar 3 times daily. Dx:  E10.9 07/21/15   Dorian Heckle English, PA  clopidogrel (PLAVIX) 75 MG tablet TAKE 1 TABLET EVERY DAY 02/21/15   Dorian Heckle English, PA  CREON 12000 units CPEP capsule TAKE TWO CAPSULES BY MOUTH THREE TIMES DAILY WITH MEALS 08/11/15   Shawnee Knapp, MD  fish oil-omega-3 fatty acids 1000 MG capsule Take 2 g by mouth daily.      Historical Provider, MD  glucose blood (ACCU-CHEK AVIVA PLUS) test strip Use to check blood sugar 3 times daily. Dx: E10.9 07/21/15   Dorian Heckle English, PA  Insulin Glargine (LANTUS SOLOSTAR) 100 UNIT/ML Solostar Pen Inject 45 Units into the skin 2 (two) times daily. Bid 09/20/15   Shawnee Knapp, MD  Insulin Pen Needle 32G X 4 MM MISC Use as directed to inject insulin  tidwc 12/14/12   Shawnee Knapp, MD  Ipratropium-Albuterol (COMBIVENT) 20-100 MCG/ACT AERS respimat Inhale 1 puff into the lungs every 6 (six) hours. 09/20/15   Shawnee Knapp, MD  l-methylfolate-B6-B12 (METANX) 3-35-2 MG TABS Take 1 tablet by mouth daily.      Historical Provider, MD  Lancets (ACCU-CHEK SOFT TOUCH) lancets Use to check blood sugar 3 times daily. Dx: E10.9 07/21/15   Dorian Heckle English, PA  metFORMIN (GLUCOPHAGE) 1000 MG tablet TAKE 1 TABLET TWICE DAILY WITH MEALS (NEED MD APPOINTMENT) 09/22/15   Darreld Mclean, MD  Multiple Vitamins-Minerals (MULTIVITAMIN WITH MINERALS) tablet Take 1 tablet by mouth daily.      Historical Provider, MD  omeprazole (PRILOSEC) 40 MG capsule TAKE 1 CAPSULE EVERY DAY 02/21/15   Joretta Bachelor, PA  Spacer/Aero-Holding [facility] Use as directed with combivent. Ok to dispense any spacer that works with the inhaler being rxed to pt. 09/20/15   Shawnee Knapp, MD   BP 148/84 mmHg  Pulse 86  Temp(Src) 97.8 F (36.6 C) (Oral)  Resp 20  Ht 6' (1.829 m)  Wt 82.555 kg  BMI 24.68 kg/m2  SpO2 98% Physical Exam  Constitutional: He is oriented to person, place, and time. He appears well-developed and well-nourished.  Non-toxic appearance. He does not have a sickly appearance. He does not appear ill.  HENT:  Head: Normocephalic and atraumatic.  Mouth/Throat: Oropharynx is clear and moist.  Eyes: Conjunctivae are normal. Pupils are equal, round, and reactive to light.  Neck: Normal range of motion. Neck supple.  No cervical midline tenderness.   Cardiovascular: Normal rate and regular rhythm.   Pulmonary/Chest: Effort normal and breath sounds normal. No accessory muscle usage or stridor. No respiratory distress. He has no wheezes. He has no rhonchi. He has no rales.  Abdominal: Soft. Bowel sounds are normal. He exhibits no distension. There is no tenderness.  Genitourinary: Rectum normal. Guaiac negative stool.  No gross blood on DRE.   Musculoskeletal: Normal  range of motion.  Lymphadenopathy:    He has no cervical adenopathy.  Neurological: He is alert and oriented to person, place, and time.  Mental Status:   AOx3.  Speech clear without dysarthria. Cranial Nerves:  I-not tested  II-PERRLA  III, IV, VI-EOMs intact  V-temporal and masseter strength intact  VII-symmetrical facial movements intact, no facial droop  VIII-hearing grossly intact bilaterally  IX, X-gag intact  XI-strength of sternomastoid and trapezius muscles 5/5  XII-tongue midline Motor:   Good muscle bulk and tone  Strength 5/5 bilaterally in upper and lower extremities   Cerebellar--intact RAMs, finger to nose, heel to shin intact bilaterally.  Gait normal with cane.  No pronator drift Sensory:  Intact in upper and lower extremities   Skin: Skin is warm and dry.  No signs of trauma.   Psychiatric: He has a normal mood and affect. His behavior is normal.    ED Course  Procedures (including critical care time) Labs Review Labs Reviewed  BASIC METABOLIC PANEL  URINALYSIS, ROUTINE W REFLEX MICROSCOPIC (NOT AT [facility])  CBC WITH DIFFERENTIAL/PLATELET  CBG MONITORING, ED  CBG MONITORING, ED  CBG MONITORING, ED    Imaging Review No results found. I have personally reviewed and evaluated these images and lab results as part of my medical decision-making.   EKG Interpretation   Date/Time:  Sunday September 25 2015 09:43:17 EDT Ventricular Rate:  83 PR Interval:    QRS Duration: 83 QT Interval:  371 QTC Calculation: 436 R Axis:   32 Text Interpretation:  Sinus rhythm No old tracing to compare Confirmed by  BELFI  MD, MELANIE [id number]) on 09/25/2015 9:45:35 AM Also confirmed by BELFI   MD, MELANIE [phone]), editor Lorenda Cahill CT, Leda Gauze [phone])  on 09/25/2015  12:02:43 PM      MDM  Patient presents for hypoglycemia.  On Levemir and Metformin BID.  Decreased PO intake, has not eaten this morning.  No other symptoms.  VSS, NAD.  Normal neurological exam, no focal deficits.   Heart RRR, lungs CTAB, abdomen soft and benign.  CBG 85.  Will encourage PO intake here.  Will obtain labs, EKG, and monitor.  Will need CBG monitoring for ~3 hours.  EKG without acute changes.  Hemoccult negative. Doubt LGIB.  Hgb 10, improved from 5 days ago.  No metabolic derangements. UA appears contaminated, culture sent.  No urinary symptoms, no indication for abx at this time.  Low suspicion for acute neurologic emergency or cardiac etiology.  Suspect hypoglycemia secondary to decreased PO intake.  Patient able to ambulate without difficulty.  CBG 155, 195, 173 over the last 3 hours.  Evaluation does not show pathology requiring ongoing emergent intervention or admission. Pt is hemodynamically stable and mentating appropriately. Discussed findings/results and plan with patient/guardian, who agrees with plan. All questions answered. Return precautions discussed and outpatient follow up given.   Final diagnoses:  Hypoglycemia   Case has been discussed with and seen by Dr. Tamera Punt who agrees with the above plan for discharge.    Gloriann Loan, PA-C 09/25/15 [place], MD 09/25/15 1314

## 2015-09-25 NOTE — ED Notes (Addendum)
Pt c/o low BS 53, daughter gave OJ PTA , daughter states increased forgetfulness

## 2015-09-25 NOTE — Discharge Instructions (Signed)

## 2015-09-26 ENCOUNTER — Other Ambulatory Visit: Payer: Self-pay

## 2015-09-26 LAB — URINE CULTURE

## 2015-09-26 MED ORDER — PANCRELIPASE (LIP-PROT-AMYL) 12000-38000 UNITS PO CPEP: ORAL_CAPSULE | ORAL | Status: DC

## 2015-09-27 ENCOUNTER — Ambulatory Visit (INDEPENDENT_AMBULATORY_CARE_PROVIDER_SITE_OTHER): Payer: Medicare HMO | Admitting: Family Medicine

## 2015-09-27 ENCOUNTER — Other Ambulatory Visit: Payer: Self-pay | Admitting: Physician Assistant

## 2015-09-27 VITALS — BP 132/74 | HR 99 | Temp 98.3°F | Resp 16 | Ht 72.0 in | Wt 185.4 lb

## 2015-09-27 DIAGNOSIS — E11649 Type 2 diabetes mellitus with hypoglycemia without coma: Secondary | ICD-10-CM

## 2015-09-27 DIAGNOSIS — R8271 Bacteriuria: Secondary | ICD-10-CM | POA: Diagnosis not present

## 2015-09-27 DIAGNOSIS — Z794 Long term (current) use of insulin: Secondary | ICD-10-CM

## 2015-09-27 DIAGNOSIS — E1121 Type 2 diabetes mellitus with diabetic nephropathy: Secondary | ICD-10-CM

## 2015-09-27 DIAGNOSIS — D649 Anemia, unspecified: Secondary | ICD-10-CM

## 2015-09-27 DIAGNOSIS — R634 Abnormal weight loss: Secondary | ICD-10-CM

## 2015-09-27 LAB — POCT URINALYSIS DIP (MANUAL ENTRY)
Bilirubin, UA: NEGATIVE
Blood, UA: NEGATIVE
GLUCOSE UA: NEGATIVE
LEUKOCYTES UA: NEGATIVE
Nitrite, UA: NEGATIVE
UROBILINOGEN UA: 4
pH, UA: 6

## 2015-09-27 LAB — POC MICROSCOPIC URINALYSIS (UMFC)

## 2015-09-27 LAB — GLUCOSE, POCT (MANUAL RESULT ENTRY): POC Glucose: 162 mg/dl — AB (ref 70–99)

## 2015-09-27 NOTE — Progress Notes (Addendum)
By signing my name below, I, Mesha Guinyard, attest that this documentation has been prepared under the direction and in the presence of Delman Cheadle, MD.  Electronically Signed: Verlee Monte, Medical Scribe. 09/27/2015. 3:42 PM.  Subjective:    Patient ID: Justin [name], male    DOB: 1934-11-25, 81 y.o.   MRN: [id number]  HPI Chief Complaint  Patient presents with  . Follow-up    Blood sugar.     HPI Comments: Justin [name] is a 81 y.o. male who presents to the [facility] for a follow-up. Pt has been having for the past twp months unintentional weight loss and hypoglyecmic episode which often presents with night sweast. He's on Metformin 1000 bid and Levomere 50 bid. Pt has lost 15 lbs in the past year and 10 lbs in the past 4 months. He has lost 2 lbs in the past week but according to ER weight had lost 5 lbs and gained 3 lbs. We've been steadily dec his insulin doses hoping that his weight loss and sweats will stop after the hypoglycemia resolves. He's down 45 units BID, but was taken to [facility] ER 2 days ago when his daughter found him down with a CBG 63. Pt's CBG has been in the 60's much of the past in the past month. Pt states it due to knee pain, however upon further questioning it appears he took his Levomere that day and have not eaten. Pt's last visit here 1 week prior, his daughter who is his main care taker and works as a Technical brewer, came with him. She was wanting to proceed with work up for weight lost etiology if symptoms didn't rapidly resolve. CXR was done- consistent with COPD-wanted home hemoccult but was not done, unable to contact.  Pt states he's doing well and is feeling all right, but his sugars are still messing up. Pt is writting his blood sugar down- this morning it was 231, at 11:30am it was 277, at 12pm is was 253. He took the pill after 12pm and it went down to 219. Pt doesn't sleep as much at night because he wants to check his blood  sugar and prevent sweating- it was 363 last night. Pt was awake so he could check his sugar, not because he can't sleep. Pt states he gets cold when he wakes up from the night sweats. Pt was sweaty Sunday before he was sent to the hospital and states "water was just pouring" off of him. Pt poops every other day and this is his baseline. Pt isn't hungry as much. Pt's granddaughter cooks for him and his wife. Pt states he can make himself eat more, and he knows how to cook. He had eggs and sausage for breakfast, [place] sandwich for lunch, and is planning on having chicken breast for dinner.  Pt didn't take his insulin this morning. Pt's daughter told him to wait to take the insulin. Pt has been taking his insulin BID, besides this morning. Pt reports a little coughing. Pt states he has some abdominal pain from his chronic pancreatitis, but states "it's not that bad" and he is going to be able to restart the creon as his daughter was able to purchase online.  Pt states his mood is fine and he organizes his tool boxes. 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 Pt had 6  children and he has 8 grandchildren, and 6 great-great grandchildren.   Patient Active Problem List   Diagnosis Date Noted  . Arthritis 09/06/2015  . Diabetic peripheral neuropathy ([place]) 08/17/2015  . OA (osteoarthritis) of knee 08/17/2015  . Hammer toe, acquired 09/30/2013  . Personal history of colonic adenomas 04/15/2013  . Chronic pancreatitis ([place]) 02/26/2013  . Anemia of chronic disease 02/26/2013  . Special screening for malignant neoplasms, colon 02/26/2013  . Essential hypertension, benign 12/14/2012  . Testosterone deficiency 10/22/2011  . Headache above the eye region 02/22/2011  . Type 2 diabetes mellitus with diabetic nephropathy, with long-term current use of insulin ([place]) 02/22/2011  . TIA on medication 02/22/2011  . Coronary artery disease  02/22/2011   Past Medical History  Diagnosis Date  . HTN (hypertension)   . Diabetes mellitus   . Hypercholesteremia   . TIA (transient ischemic attack)   . Iron deficiency   . Arthritis   . Stroke ([place])   . Pancreatitis, acute 06/02/2012    Diagnosed 05/2012   . Personal history of colonic adenomas 04/15/2013    04/15/2013 2 diminutive polyps     Past Surgical History  Procedure Laterality Date  . Back surgery  1985  . Neck surgery  1985  . Temporal artery biopsy / ligation  02/26/2011    left side  . Artery biopsy  02/26/2011    Procedure: BIOPSY TEMPORAL ARTERY;  Surgeon: Willey Blade, MD;  Location: [facility];  Service: General;  Laterality: Left;  Left temporal artery biospy  . Cholecystectomy    . Colonoscopy    . Upper gastrointestinal endoscopy    . Eus     No Known Allergies Prior to Admission medications   Medication Sig Start Date End Date Taking? Authorizing Provider  Alcohol Swabs PADS Use to check blood sugar 3 times daily. Dx: E10.9 07/21/15   Dorian Heckle English, PA  atorvastatin (LIPITOR) 40 MG tablet TAKE 1 TABLET EVERY DAY 07/21/15   Dorian Heckle English, PA  Blood Glucose Calibration (ACCU-CHEK AVIVA) SOLN Use to check blood sugar 3 times daily. Dx: E10.9 07/21/15   Dorian Heckle English, PA  Blood Glucose Monitoring Suppl (ACCU-CHEK AVIVA PLUS) w/Device KIT Use to check blood sugar 3 times daily. Dx: E10.9 09/27/15   Wardell Honour, MD  clopidogrel (PLAVIX) 75 MG tablet TAKE 1 TABLET EVERY DAY 02/21/15   Dorian Heckle English, PA  fish oil-omega-3 fatty acids 1000 MG capsule Take 2 g by mouth daily.      Historical Provider, MD  glucose blood (ACCU-CHEK AVIVA PLUS) test strip Use to check blood sugar 3 times daily. Dx: E10.9 07/21/15   Dorian Heckle English, PA  Insulin Glargine (LANTUS SOLOSTAR) 100 UNIT/ML Solostar Pen Inject 45 Units into the skin 2 (two) times daily. Bid 09/20/15   Shawnee Knapp, MD  Insulin Pen Needle 32G X 4 MM MISC Use as directed  to inject insulin tidwc 12/14/12   Shawnee Knapp, MD  Ipratropium-Albuterol (COMBIVENT) 20-100 MCG/ACT AERS respimat Inhale 1 puff into the lungs every 6 (six) hours. 09/20/15   Shawnee Knapp, MD  l-methylfolate-B6-B12 (METANX) 3-35-2 MG TABS Take 1 tablet by mouth daily.      Historical Provider, MD  Lancets (ACCU-CHEK SOFT TOUCH) lancets Use to check blood sugar 3 times daily. Dx: E10.9 07/21/15   Dorian Heckle English, PA  lipase/protease/amylase (CREON) 12000 units CPEP capsule TAKE TWO CAPSULES BY MOUTH THREE TIMES DAILY WITH MEALS 09/26/15  Wardell Honour, MD  metFORMIN (GLUCOPHAGE) 1000 MG tablet TAKE 1 TABLET TWICE DAILY WITH MEALS (NEED MD APPOINTMENT) 09/22/15   Darreld Mclean, MD  Multiple Vitamins-Minerals (MULTIVITAMIN WITH MINERALS) tablet Take 1 tablet by mouth daily.      Historical Provider, MD  omeprazole (PRILOSEC) 40 MG capsule TAKE 1 CAPSULE EVERY DAY 02/21/15   Joretta Bachelor, PA  Spacer/Aero-Holding [facility] Use as directed with combivent. Ok to dispense any spacer that works with the inhaler being rxed to pt. 09/20/15   Shawnee Knapp, MD   Social History   Social History  . Marital Status: Married    Spouse Name: N/A  . Number of Children: 6  . Years of Education: N/A   Occupational History  . Not on file.   Social History Main Topics  . Smoking status: Former Smoker -- 35 years    Types: Cigarettes    Quit date: 04/09/1990  . Smokeless tobacco: Never Used  . Alcohol Use: No     Comment: "per pt stopped drinking in the 90's"  . Drug Use: No  . Sexual Activity: Not on file   Other Topics Concern  . Not on file   Social History Narrative   Depression screen [facility] 2/9 09/27/2015 09/20/2015 09/15/2015 09/06/2015 06/29/2015  Decreased Interest 0 0 0 0 0  Down, Depressed, Hopeless 0 0 0 0 0  PHQ - 2 Score 0 0 0 0 0   Review of Systems  Constitutional: Positive for diaphoresis (but not since Sunday), appetite change and unexpected weight change.  Eyes: Negative for  visual disturbance.  Respiratory: Positive for cough (pt states "very little"). Negative for shortness of breath and wheezing.   Cardiovascular: Negative for chest pain.  Gastrointestinal: Positive for abdominal pain (pt states "just a little, it's not bad").  Genitourinary: Negative for dysuria, urgency, frequency and difficulty urinating.  Psychiatric/Behavioral: Positive for sleep disturbance (checkin his blood sugar levels). Negative for dysphoric mood.   Objective:  BP 132/74 mmHg  Pulse 99  Temp(Src) 98.3 F (36.8 C) (Oral)  Resp 16  Ht 6' (1.829 m)  Wt 185 lb 6.4 oz (84.097 kg)  BMI 25.14 kg/m2  SpO2 98%  Physical Exam  Constitutional: He appears well-developed and well-nourished. No distress.  HENT:  Head: Normocephalic and atraumatic.  Eyes: Conjunctivae are normal.  Neck: Neck supple.  Cardiovascular: Normal rate.   Pulmonary/Chest: Effort normal.  Neurological: He is alert.  Skin: Skin is warm and dry.  Psychiatric: He has a normal mood and affect. His behavior is normal.  Nursing note and vitals reviewed.  Results for orders placed or performed in visit on 09/27/15  POCT glucose (manual entry)  Result Value Ref Range   POC Glucose 162 (A) 70 - 99 mg/dl  POCT urinalysis dipstick  Result Value Ref Range   Color, UA yellow yellow   Clarity, UA clear clear   Glucose, UA negative negative   Bilirubin, UA negative negative   Ketones, POC UA trace (5) (A) negative   Spec Grav, UA >=1.030    Blood, UA negative negative   pH, UA 6.0    Protein Ur, POC =100 (A) negative   Urobilinogen, UA 4.0    Nitrite, UA Negative Negative   Leukocytes, UA Negative Negative   Assessment & Plan:   1. Type 2 diabetes mellitus with diabetic nephropathy, with long-term current use of insulin ([place])   2. Type 2 diabetes mellitus with hypoglycemia without coma, with long-term  current use of insulin ([place]) - pt with numerous episodes of hypoglycemia over the past 1-2 mos. We have been  gradually weaning back on his insulin dose from 50u bid to 45u bid of levemir.  He did not take his levemir this morning for the first skipped dose and his cbgs have been 200s - 162 now. He has started checking his cbgs 4-5x/d even waking up in the middle of the night initially to check is cbg. Decrease his dose to just 60u qhs starting tonight. Cont metformin 1000 bid. Recheck with me in 1 wk., Check cbgs fasting, 2 hrs post-prandial and qhs, bring log to f/u. Refer to endocrine for help in insulin management since so labile.  3. Anemia, unspecified anemia type - neg HOC x 1 in ER, do home HOC.  4. Loss of weight - likely cause of needing to decrease insulin but unknown etiology - pt is feeling well otherwise and reports eating healthier as granddaughter has started cooking for him which may be responsible for the weight loss or that pt has had to go off on the creon for chronic pancreatitis due to cost (but is going to be able to restart).  Will do [place] cards to screen for colon cancer and make appt for [facility].   Keep a close eye and cont w/u if weight loss doesn't stabilize.  5. Bacteria in urine - contaminated specimen from ER - recheck.    Orders Placed This Encounter  Procedures  . Ambulatory referral to Endocrinology    Referral Priority:  Routine    Referral Type:  Consultation    Referral Reason:  Specialty Services Required    Number of Visits Requested:  1  . POCT glucose (manual entry)  . POCT urinalysis dipstick  . POCT Microscopic Urinalysis ([facility])  . POC Hemoccult Bld/Stl (3-Cd Home Screen)    Standing Status: Future     Number of Occurrences:      Standing Expiration Date: 09/26/2016    I personally performed the services described in this documentation, which was scribed in my presence. The recorded information has been reviewed and considered, and addended by me as needed.   Delman Cheadle, M.D.  [facility] [address] [place], [place]  [id number] [phone] phone [phone] fax  09/28/2015 8:20 AM

## 2015-09-27 NOTE — Patient Instructions (Addendum)
I will see you back in 1 week on Tuesday June 27th - you can come anytime from 8 -4 pm.  Decrease your levemir to just 60 units at night.  Do not take the insulin twice a day anymore.  Continue on metformin 1000mg  twice a day still.  Check your blood sugar first thing in the morning,  Check your blood sugar 2 hours after either lunch, breakfast, or dinner. Check your blood sugar before bed. If you have any sweats, check your blood sugar. Record all of these and bring them to your next visit. They are going to be to high - I expect to see lots of 200s and 300s - but that is ok - it is better than to low. I am happy to send you to a diabetes doctor if you would like to have a specialist help [place] get control of your sugars.  Do the stool cards and either put them in the mail or bring them back with you next week.    IF you received an x-ray today, you will receive an invoice from [facility]. Please contact [facility] at [phone] with questions or concerns regarding your invoice.   IF you received labwork today, you will receive an invoice from [employer]. Please contact [facility] at [phone] with questions or concerns regarding your invoice.   Our billing staff will not be able to assist you with questions regarding bills from these companies.  You will be contacted with the lab results as soon as they are available. The fastest way to get your results is to activate your My Chart account. Instructions are located on the last page of this paperwork. If you have not heard from [place] regarding the results in 2 weeks, please contact this office.     Hypoglycemia Hypoglycemia occurs when the glucose in your blood is too low. Glucose is a type of sugar that is your body's main energy source. Hormones, such as insulin and glucagon, control the level of glucose in the blood. Insulin lowers blood glucose and glucagon increases blood glucose. Having too  much insulin in your blood stream, or not eating enough food containing sugar, can result in hypoglycemia. Hypoglycemia can happen to people with or without diabetes. It can develop quickly and can be a medical emergency.  CAUSES   Missing or delaying meals.  Not eating enough carbohydrates at meals.  Taking too much diabetes medicine.  Not timing your oral diabetes medicine or insulin doses with meals, snacks, and exercise.  Nausea and vomiting.  Certain medicines.  Severe illnesses, such as hepatitis, kidney disorders, and certain eating disorders.  Increased activity or exercise without eating something extra or adjusting medicines.  Drinking too much alcohol.  A nerve disorder that affects body functions like your heart rate, blood pressure, and digestion (autonomic neuropathy).  A condition where the stomach muscles do not function properly (gastroparesis). Therefore, medicines and food may not absorb properly.  Rarely, a tumor of the pancreas can produce too much insulin. SYMPTOMS   Hunger.  Sweating (diaphoresis).  Change in body temperature.  Shakiness.  Headache.  Anxiety.  Lightheadedness.  Irritability.  Difficulty concentrating.  Dry mouth.  Tingling or numbness in the hands or feet.  Restless sleep or sleep disturbances.  Altered speech and coordination.  Change in mental status.  Seizures or prolonged convulsions.  Combativeness.  Drowsiness (lethargic).  Weakness.  Increased heart rate or palpitations.  Confusion.  Pale, gray skin color.  Blurred  or double vision.  Fainting. DIAGNOSIS  A physical exam and medical history will be performed. Your caregiver may make a diagnosis based on your symptoms. Blood tests and other lab tests may be performed to confirm a diagnosis. Once the diagnosis is made, your caregiver will see if your signs and symptoms go away once your blood glucose is raised.  TREATMENT  Usually, you can easily  treat your hypoglycemia when you notice symptoms.  Check your blood glucose. If it is less than 70 mg/dl, take one of the following:   3-4 glucose tablets.    cup juice.    cup regular soda.   1 cup skim milk.   -1 tube of glucose gel.   5-6 hard candies.   Avoid high-fat drinks or food that may delay a rise in blood glucose levels.  Do not take more than the recommended amount of sugary foods, drinks, gel, or tablets. Doing so will cause your blood glucose to go too high.   Wait 10-15 minutes and recheck your blood glucose. If it is still less than 70 mg/dl or below your target range, repeat treatment.   Eat a snack if it is more than 1 hour until your next meal.  There may be a time when your blood glucose may go so low that you are unable to treat yourself at home when you start to notice symptoms. You may need someone to help you. You may even faint or be unable to swallow. If you cannot treat yourself, someone will need to bring you to the hospital.  [place]  If you have diabetes, follow your diabetes management plan by:  Taking your medicines as directed.  Following your exercise plan.  Following your meal plan. Do not skip meals. Eat on time.  Testing your blood glucose regularly. Check your blood glucose before and after exercise. If you exercise longer or different than usual, be sure to check blood glucose more frequently.  Wearing your medical alert jewelry that says you have diabetes.  Identify the cause of your hypoglycemia. Then, develop ways to prevent the recurrence of hypoglycemia.  Do not take a hot bath or shower right after an insulin shot.  Always carry treatment with you. Glucose tablets are the easiest to carry.  If you are going to drink alcohol, drink it only with meals.  Tell friends or family members ways to keep you safe during a seizure. This may include removing hard or sharp objects from the area or turning you  on your side.  Maintain a healthy weight. SEEK MEDICAL CARE IF:   You are having problems keeping your blood glucose in your target range.  You are having frequent episodes of hypoglycemia.  You feel you might be having side effects from your medicines.  You are not sure why your blood glucose is dropping so low.  You notice a change in vision or a new problem with your vision. SEEK IMMEDIATE MEDICAL CARE IF:   Confusion develops.  A change in mental status occurs.  The inability to swallow develops.  Fainting occurs.   This information is not intended to replace advice given to you by your health care provider. Make sure you discuss any questions you have with your health care provider.   Document Released: 03/26/2005 Document Revised: 03/31/2013 Document Reviewed: 11/30/2014 Elsevier Interactive Patient Education [employer].

## 2015-09-28 ENCOUNTER — Encounter: Payer: Self-pay | Admitting: Family Medicine

## 2015-09-28 MED ORDER — INSULIN GLARGINE 100 UNIT/ML SOLOSTAR PEN: 60.0000 [IU] | PEN_INJECTOR | Freq: Every day | SUBCUTANEOUS | Status: DC

## 2015-09-29 ENCOUNTER — Encounter: Payer: Self-pay | Admitting: Internal Medicine

## 2015-09-29 ENCOUNTER — Ambulatory Visit (INDEPENDENT_AMBULATORY_CARE_PROVIDER_SITE_OTHER): Payer: Medicare HMO | Admitting: Internal Medicine

## 2015-09-29 VITALS — BP 120/60 | HR 101 | Wt 183.4 lb

## 2015-09-29 DIAGNOSIS — Z794 Long term (current) use of insulin: Secondary | ICD-10-CM | POA: Diagnosis not present

## 2015-09-29 DIAGNOSIS — E11649 Type 2 diabetes mellitus with hypoglycemia without coma: Secondary | ICD-10-CM | POA: Insufficient documentation

## 2015-09-29 DIAGNOSIS — E119 Type 2 diabetes mellitus without complications: Secondary | ICD-10-CM | POA: Insufficient documentation

## 2015-09-29 DIAGNOSIS — N1832 Chronic kidney disease, stage 3b: Secondary | ICD-10-CM | POA: Insufficient documentation

## 2015-09-29 DIAGNOSIS — I2510 Atherosclerotic heart disease of native coronary artery without angina pectoris: Secondary | ICD-10-CM

## 2015-09-29 MED ORDER — GLUCAGON (RDNA) 1 MG IJ KIT: 1.0000 mg | PACK | Freq: Once | INTRAMUSCULAR | Status: DC | PRN

## 2015-09-29 MED ORDER — GLIPIZIDE ER 2.5 MG PO TB24: 2.5000 mg | ORAL_TABLET | Freq: Every day | ORAL | Status: DC

## 2015-09-29 NOTE — Progress Notes (Signed)
Patient ID: Justin [name], male   DOB: 1935/03/27, 81 y.o.   MRN: [id number]  HPI: Justin [name] is a 81 y.o.-year-old male, referred by his PCP, Dr.Shaw, for management of DM2, dx in 1980s, insulin-dependent since ~diagnosis, uncontrolled, with complications (hypoglycemia, PN). He is here with his daughter who offers part of the hx.  He had several lows in last month - lowest 40. 4 days ago, he was found confused by family member and his sugars were 41 after given Sunny D. He had several visits to urgent care and ED for hypoglycemia. He also had unintentional weight loss: 10-15 lbs in the last few months. His daughter is telling me that the patient was not doing a good job eating, while trying to take care of his wife who has Alzheimer's disease. He has a h/o pancreatitis, but denies abdominal pain as a cause for his not eating well. He is complaining about the cost of Creon.   Last hemoglobin A1c was: Lab Results  Component Value Date   HGBA1C 7.5 09/06/2015   HGBA1C 8.6 05/20/2015   HGBA1C 8.2 08/12/2014   Pt is on a regimen of: - Levemir 50 units 2x a day >> 60 units daily (started 2x a day) - Metformin 1000 mg 2x a day  Pt checks his sugars 2-4x a day - after decreasing the insulin: - am: 205, 233 - 2h after b'fast: n/c - before lunch: 171 - 2h after lunch: n/c - before dinner: 176 - 2h after dinner: n/c - bedtime: 196 - nighttime: n/c + many lows. Lowest sugar was 63 (but this checked after he got carbs), per chart: 40.; he has hypoglycemia awareness at 70.  Highest sugar was .  Glucometer: AccuCheck [employer]iva  Pt's meals are: - Breakfast: nabs + banana + diet soda >> now sausage and eggs - Lunch: muffin, cakes, tuna fish, fast food - Dinner: pork chops, beans, greens now - Snacks: crackers, PB Now niece goes to his house to cook.  - no CKD, last BUN/creatinine:  Lab Results  Component Value Date   BUN 17 09/25/2015   BUN 14 09/20/2015   CREATININE 0.97 09/25/2015    CREATININE 0.94 09/20/2015   - last set of lipids: Lab Results  Component Value Date   CHOL 98* 09/20/2015   HDL 54 09/20/2015   LDLCALC 34 09/20/2015   TRIG 49 09/20/2015   CHOLHDL 1.8 09/20/2015   - last eye exam was in >43yr No DR.  - + numbness and tingling in his feet.  Pt has FH of DM in sister.  ROS: Constitutional: + Weight loss, + decreased appetite, no fatigue, + hot flashes, + night sweats, + Nocturia Eyes: no blurry vision, no xerophthalmia ENT: no sore throat, no nodules palpated in throat, no dysphagia/odynophagia, no hoarseness Cardiovascular: no CP/SOB/palpitations/+ leg swelling Respiratory: + cough/no SOB Gastrointestinal: no N/V/D/C/+ heartburn Musculoskeletal: + Both: muscle/joint aches Skin: no rashes Neurological: no tremors/numbness/tingling/dizziness Psychiatric: no depression/anxiety  Past Medical History  Diagnosis Date  . HTN (hypertension)   . Diabetes mellitus   . Hypercholesteremia   . TIA (transient ischemic attack)   . Iron deficiency   . Arthritis   . [place]   . Pancreatitis, acute 06/02/2012    Diagnosed 05/2012   . Personal history of colonic adenomas 04/15/2013    04/15/2013 2 diminutive polyps     Past Surgical History  Procedure Laterality Date  . Back surgery  1985  . Neck surgery  1985  .  Temporal artery biopsy / ligation  02/26/2011    left side  . Artery biopsy  02/26/2011    Procedure: BIOPSY TEMPORAL ARTERY;  Surgeon: Willey Blade, MD;  Location: [facility];  Service: General;  Laterality: Left;  Left temporal artery biospy  . Cholecystectomy    . Colonoscopy    . Upper gastrointestinal endoscopy    . Eus     Social History   Social History  . Marital Status: Married    Spouse Name: N/A  . Number of Children: 6  . Years of Education: N/A   Occupational History  . Not on file.   Social History Main Topics  . Smoking status: Former Smoker -- 35 years    Types: Cigarettes    Quit  date: 04/09/1990  . Smokeless tobacco: Never Used  . Alcohol Use: No     Comment: "per pt stopped drinking in the 90's"  . Drug Use: No  . Sexual Activity: Not on file   Other Topics Concern  . Not on file   Social History Narrative   Current Outpatient Prescriptions on File Prior to Visit  Medication Sig Dispense Refill  . Alcohol Swabs PADS Use to check blood sugar 3 times daily. Dx: E10.9 300 each 3  . atorvastatin (LIPITOR) 40 MG tablet TAKE 1 TABLET EVERY DAY 90 tablet 1  . Blood Glucose Calibration (ACCU-CHEK AVIVA) SOLN Use to check blood sugar 3 times daily. Dx: E10.9 1 each 3  . Blood Glucose Monitoring Suppl (ACCU-CHEK AVIVA PLUS) w/Device KIT Use to check blood sugar 3 times daily. Dx: E10.9 1 kit 0  . clopidogrel (PLAVIX) 75 MG tablet TAKE 1 TABLET EVERY DAY 90 tablet 3  . fish oil-omega-3 fatty acids 1000 MG capsule Take 2 g by mouth daily.      Marland Kitchen glucose blood (ACCU-CHEK AVIVA PLUS) test strip Use to check blood sugar 3 times daily. Dx: E10.9 300 each 3  . Insulin Glargine (LANTUS SOLOSTAR) 100 UNIT/ML Solostar Pen Inject 60 Units into the skin daily at 10 pm. 5 pen 1  . Insulin Pen Needle 32G X 4 MM MISC Use as directed to inject insulin tidwc 100 each 11  . Ipratropium-Albuterol (COMBIVENT) 20-100 MCG/ACT AERS respimat Inhale 1 puff into the lungs every 6 (six) hours. 4 g 3  . l-methylfolate-B6-B12 (METANX) 3-35-2 MG TABS Take 1 tablet by mouth daily.      . Lancets (ACCU-CHEK SOFT TOUCH) lancets Use to check blood sugar 3 times daily. Dx: E10.9 300 each 3  . lipase/protease/amylase (CREON) 12000 units CPEP capsule TAKE TWO CAPSULES BY MOUTH THREE TIMES DAILY WITH MEALS 540 capsule 1  . metFORMIN (GLUCOPHAGE) 1000 MG tablet TAKE 1 TABLET TWICE DAILY WITH MEALS (NEED MD APPOINTMENT) 180 tablet 0  . Multiple Vitamins-Minerals (MULTIVITAMIN WITH MINERALS) tablet Take 1 tablet by mouth daily.      Marland Kitchen omeprazole (PRILOSEC) 40 MG capsule TAKE 1 CAPSULE EVERY DAY 90 capsule 3  .  Spacer/Aero-Holding Dorise Bullion Use as directed with combivent. Ok to dispense any spacer that works with the inhaler being rxed to pt. 1 each 1  . [DISCONTINUED] testosterone cypionate (DEPOTESTOTERONE CYPIONATE) 200 MG/ML injection Inject into the muscle every 14 (fourteen) days.     Current Facility-Administered Medications on File Prior to Visit  Medication Dose Route Frequency Provider Last Rate Last Dose  . testosterone cypionate (DEPOTESTOTERONE CYPIONATE) injection 200 mg  200 mg Intramuscular Q14 Days Collene Leyden, PA-C  200 mg at 04/05/13 6237   No Known Allergies Family History  Problem Relation Age of Onset  . Heart disease Mother   . Diabetes Sister     x 2  . Heart attack Sister   . Amblyopia Father    PE: BP 120/60 mmHg  Pulse 101  Wt 183 lb 6.4 oz (83.19 kg)  SpO2 98% Body mass index is 24.87 kg/(m^2). Wt Readings from Last 3 Encounters:  09/29/15 183 lb 6.4 oz (83.19 kg)  09/27/15 185 lb 6.4 oz (84.097 kg)  09/25/15 182 lb (82.555 kg)   Constitutional: Normal weight, in NAD, in wheelchair Eyes: PERRLA, EOMI, no exophthalmos ENT: moist mucous membranes, no thyromegaly, no cervical lymphadenopathy Cardiovascular: Tachycardia, RR, No MRG Respiratory: CTA B Gastrointestinal: abdomen soft, NT, ND, BS+ Musculoskeletal: no deformities, strength intact in all 4 Skin: moist, warm, no rashes Neurological: no tremor with outstretched hands, DTR normal in all 4  ASSESSMENT: 1. DM2, insulin-dependent, uncontrolled, without complications  PLAN:  1. Patient with long-standing, uncontrolled diabetes, on oral antidiabetic regimen + Levemir. He was initially on a large dose of basal insulin, with subsequent low blood sugars in the last month (lowest 40). He is unclear what precipitated the lows, but he had an associated 10-15 pound weight loss and a low albumin level at the time of his ED visit, therefore, Low caloric intake could  have contributed. His daughter tells me  that patient's wife has Alzheimer ds and he was doing his best to take care of her and did not pay much attention to what he was eating. As of now, his niece is going to their house and cooking for them. - he had a reduction of the dose by 40% 2 days ago. His sugars are now above target, between 170 and 200.  - We discussed about how to correct low blood sugars and also sent a prescription for a glucagon pen to his pharmacy. Her daughter and niece are CMA's and they know how to use the pen.  - We also discussed about the fact that we should stay on the lower dose of basal insulin but we need to cover his meals better. I would like to avoid mealtime insulin, but we do not have a lot of other options. He has a history of pancreatitis, therefore, DPP 4 inhibitors or a GLP-1 receptor analogs are out of the question. I do not feel that he is a good candidate for an SGLT 2 inhibitor. With his coronary artery disease, I am also reticent to use Actos. Cycloset will not help him a lot and I do not think it is covered by his insurance. Therefore, I discussed with daughter to try a very low dose of glipizide extended-release and keep a close eye on his sugars. They will call me with the sugars in 3 days regardless of the levels, or sooner if he develops lows. - I suggested to:  Patient Instructions  Please continue: - Levemir 60 units at bedtime - Metformin 1000 mg 2x a day  Please start: - Glipizide ER 2.5 mg daily in am, before b'fast  Please let me know if the sugars are consistently <80 or >200.  Also, please call me on Monday with the sugars.  Please return in 1.5 months with your sugar log.   - continue checking sugars at different times of the day - check 2 times a day, rotating checks - given sugar log and advised how to fill it and to bring  it at next appt  - given foot care handout and explained the principles  - given instructions for hypoglycemia management "15-15 rule"  - advised for yearly  eye exams  - Return to clinic in 1.5 mo with sugar log   Philemon Kingdom, MD PhD [facility] Endocrinology

## 2015-09-29 NOTE — Patient Instructions (Addendum)
Please continue: - Levemir 60 units at bedtime - Metformin 1000 mg 2x a day  Please start: - Glipizide ER 2.5 mg daily in am, before b'fast  Please let me know if the sugars are consistently <80 or >200.  Also, please call me on Monday with the sugars.  Please return in 1.5 months with your sugar log.   PATIENT INSTRUCTIONS FOR TYPE 2 DIABETES:  **Please join MyChart!** - see attached instructions about how to join if you have not done so already.  DIET AND EXERCISE Diet and exercise is an important part of diabetic treatment.  We recommended aerobic exercise in the form of brisk walking (working between 40-60% of maximal aerobic capacity, similar to brisk walking) for 150 minutes per week (such as 30 minutes five days per week) along with 3 times per week performing 'resistance' training (using various gauge rubber tubes with handles) 5-10 exercises involving the major muscle groups (upper body, lower body and core) performing 10-15 repetitions (or near fatigue) each exercise. Start at half the above goal but build slowly to reach the above goals. If limited by weight, joint pain, or disability, we recommend daily walking in a swimming pool with water up to waist to reduce pressure from joints while allow for adequate exercise.    BLOOD GLUCOSES Monitoring your blood glucoses is important for continued management of your diabetes. Please check your blood glucoses 2-4 times a day: fasting, before meals and at bedtime (you can rotate these measurements - e.g. one day check before the 3 meals, the next day check before 2 of the meals and before bedtime, etc.).   HYPOGLYCEMIA (low blood sugar) Hypoglycemia is usually a reaction to not eating, exercising, or taking too much insulin/ other diabetes drugs.  Symptoms include tremors, sweating, hunger, confusion, headache, etc. Treat IMMEDIATELY with 15 grams of Carbs: . 4 glucose tablets .  cup regular juice/soda . 2 tablespoons raisins . 4  teaspoons sugar . 1 tablespoon honey Recheck blood glucose in 15 mins and repeat above if still symptomatic/blood glucose <100.  RECOMMENDATIONS TO REDUCE YOUR RISK OF DIABETIC COMPLICATIONS: * Take your prescribed MEDICATION(S) * Follow a DIABETIC diet: Complex carbs, fiber rich foods, (monounsaturated and polyunsaturated) fats * AVOID saturated/trans fats, high fat foods, >2,300 mg salt per day. * EXERCISE at least 5 times a week for 30 minutes or preferably daily.  * DO NOT SMOKE OR DRINK more than 1 drink a day. * Check your FEET every day. Do not wear tightfitting shoes. Contact us if you develop an ulcer * See your EYE doctor once a year or more if needed * Get a FLU shot once a year * Get a PNEUMONIA vaccine once before and once after age 7 years  GOALS:  * Your Hemoglobin A1c of <7%  * fasting sugars need to be <130 * after meals sugars need to be <180 (2h after you start eating) * Your Systolic BP should be [id number] or lower  * Your Diastolic BP should be 80 or lower  * Your HDL (Good Cholesterol) should be 40 or higher  * Your LDL (Bad Cholesterol) should be 100 or lower. * Your Triglycerides should be 150 or lower  * Your Urine microalbumin (kidney function) should be <30 * Your Body Mass Index should be 25 or lower    Please consider the following ways to cut down carbs and fat and increase fiber and micronutrients in your diet: - substitute whole grain for white bread or  pasta - substitute brown rice for white rice - substitute 90-calorie flat bread pieces for slices of bread when possible - substitute sweet potatoes or yams for white potatoes - substitute humus for margarine - substitute tofu for cheese when possible - substitute almond or rice milk for regular milk (would not drink soy milk daily due to concern for soy estrogen influence on breast cancer risk) - substitute dark chocolate for other sweets when possible - substitute water - can add lemon or orange slices  for taste - for diet sodas (artificial sweeteners will trick your body that you can eat sweets without getting calories and will lead you to overeating and weight gain in the long run) - do not skip breakfast or other meals (this will slow down the metabolism and will result in more weight gain over time)  - can try smoothies made from fruit and almond/rice milk in am instead of regular breakfast - can also try old-fashioned (not instant) oatmeal made with almond/rice milk in am - order the dressing on the side when eating salad at a restaurant (pour less than half of the dressing on the salad) - eat as little meat as possible - can try juicing, but should not forget that juicing will get rid of the fiber, so would alternate with eating raw veg./fruits or drinking smoothies - use as little oil as possible, even when using olive oil - can dress a salad with a mix of balsamic vinegar and lemon juice, for e.g. - use agave nectar, stevia sugar, or regular sugar rather than artificial sweateners - steam or broil/roast veggies  - snack on veggies/fruit/nuts (unsalted, preferably) when possible, rather than processed foods - reduce or eliminate aspartame in diet (it is in diet sodas, chewing gum, etc) Read the labels!  Try to read Dr. Janene Harvey book: "Program for Reversing Diabetes" for other ideas for healthy eating.

## 2015-09-30 ENCOUNTER — Telehealth: Payer: Self-pay

## 2015-09-30 DIAGNOSIS — E1165 Type 2 diabetes mellitus with hyperglycemia: Secondary | ICD-10-CM

## 2015-09-30 DIAGNOSIS — Z794 Long term (current) use of insulin: Principal | ICD-10-CM

## 2015-09-30 DIAGNOSIS — IMO0002 Reserved for concepts with insufficient information to code with codable children: Secondary | ICD-10-CM

## 2015-09-30 NOTE — Telephone Encounter (Signed)
Pt was seen by his diabetic dr today and she recommended that he be referred for an eye exam and ear doctor he wants to be referred to dr popa on [address] -she is a male provider  Best number [phone]

## 2015-10-01 NOTE — Telephone Encounter (Signed)
Referral placed for Dr. Nonnie Done - eye doctor. How about the ear doctor request? - i didn't see this mentioned in Dr. Chrissie Noa notes but I'm happy to refer him for hearing test if he is having trouble hearing or is it something else?

## 2015-10-01 NOTE — Telephone Encounter (Signed)
Patient had appointment with diabetic doctor and they wanted him to see a eye doctor and a ear doctor. Would you be willing to put referral in. Patient wanted to see a Dr. Nonnie Done for the eye exam.

## 2015-10-03 ENCOUNTER — Telehealth: Payer: Self-pay | Admitting: Internal Medicine

## 2015-10-03 ENCOUNTER — Telehealth: Payer: Self-pay

## 2015-10-03 ENCOUNTER — Other Ambulatory Visit: Payer: Self-pay | Admitting: Internal Medicine

## 2015-10-03 MED ORDER — INSULIN GLARGINE 100 UNIT/ML SOLOSTAR PEN: 45.0000 [IU] | PEN_INJECTOR | Freq: Every day | SUBCUTANEOUS | Status: DC

## 2015-10-03 NOTE — Telephone Encounter (Signed)
Patient daughter stated patient was told to take insulin and glipiZIDE in the morning and the metformin at night, his b/s are in the 60ties in the mor, at night in the 100dreds please advise

## 2015-10-03 NOTE — Telephone Encounter (Signed)
Called patient to find out a little more information on the doctor that he is requesting to see for his opthamology referral. Left message for patient to call back.

## 2015-10-03 NOTE — Telephone Encounter (Signed)
Called patient. In touch with patient son and grand daughter who called regarding the change in medicine and change in blood sugars. Patient family will make changes to insulin, and call back in 2 days with blood sugar readings, per MD.

## 2015-10-03 NOTE — Telephone Encounter (Signed)
This is good news! We can reduce the insulin dose: Please continue Glipizide and Metformin as they are, but decrease Levemir to 45 units at bedtime. Call back with sugars in 2 days.

## 2015-10-04 LAB — POC HEMOCCULT BLD/STL (HOME/3-CARD/SCREEN)
Card #2 Fecal Occult Blod, POC: NEGATIVE
Card #3 Fecal Occult Blood, POC: NEGATIVE
FECAL OCCULT BLD: NEGATIVE

## 2015-10-04 NOTE — Addendum Note (Signed)
Addended by: Benson Setting L on: 10/04/2015 11:03 AM   Modules accepted: Orders

## 2015-10-06 ENCOUNTER — Telehealth: Payer: Self-pay | Admitting: Internal Medicine

## 2015-10-06 ENCOUNTER — Telehealth: Payer: Self-pay

## 2015-10-06 NOTE — Telephone Encounter (Signed)
Ok, let's ask him to take a second glipizide Er 2.5 mg dose (crushed - he can crush it in his teeth or cut it in half and swallow the 2 halves) before dinner. Continue the rest of the meds. Call back with sugars on Monday for further adjustment.

## 2015-10-06 NOTE — Telephone Encounter (Signed)
Pt calling regarding BS reading before breakfast yesterday 131, 119, 155, 365 at bedtime This am down to 116.

## 2015-10-06 NOTE — Telephone Encounter (Signed)
PT returning your call, requests call back at your earliest convenience.

## 2015-10-06 NOTE — Telephone Encounter (Signed)
Returned patient call, asked to be called back for instructions on medications for his blood sugars.

## 2015-10-07 ENCOUNTER — Telehealth: Payer: Self-pay

## 2015-10-07 ENCOUNTER — Other Ambulatory Visit: Payer: Self-pay | Admitting: Family Medicine

## 2015-10-07 NOTE — Telephone Encounter (Signed)
Called patient, discussed medication increase at dinner. Patient will call back on Monday with blood sugar readings to see if it helped. No questions or concerns.

## 2015-11-17 ENCOUNTER — Telehealth: Payer: Self-pay | Admitting: Internal Medicine

## 2015-11-17 ENCOUNTER — Other Ambulatory Visit: Payer: Self-pay

## 2015-11-17 MED ORDER — GLUCOSE BLOOD VI STRP: ORAL_STRIP | 3 refills | Status: DC

## 2015-11-17 NOTE — Telephone Encounter (Signed)
PT daughter called, he needs his test strips called into [facility]  She said he is completely out.

## 2015-12-01 ENCOUNTER — Encounter: Payer: Self-pay | Admitting: Internal Medicine

## 2015-12-01 ENCOUNTER — Ambulatory Visit (INDEPENDENT_AMBULATORY_CARE_PROVIDER_SITE_OTHER): Payer: Medicare HMO | Admitting: Internal Medicine

## 2015-12-01 VITALS — BP 118/68 | HR 99 | Ht 67.0 in | Wt 180.0 lb

## 2015-12-01 DIAGNOSIS — Z794 Long term (current) use of insulin: Secondary | ICD-10-CM

## 2015-12-01 DIAGNOSIS — E11649 Type 2 diabetes mellitus with hypoglycemia without coma: Secondary | ICD-10-CM

## 2015-12-01 LAB — POCT GLYCOSYLATED HEMOGLOBIN (HGB A1C): Hemoglobin A1C: 7.9

## 2015-12-01 MED ORDER — GLIPIZIDE ER 2.5 MG PO TB24: 2.5000 mg | ORAL_TABLET | Freq: Every day | ORAL | 3 refills | Status: DC

## 2015-12-01 NOTE — Addendum Note (Signed)
Addended by: Caprice Beaver T on: 12/01/2015 04:16 PM   Modules accepted: Orders

## 2015-12-01 NOTE — Patient Instructions (Addendum)
Please continue Metformin 1000 mg 2x a day  Please move the Levemir 44 units to morning.  Move Glipizide XL 2.5 mg to morning, before breakfast.  Please let me know if the sugars are consistently <80 or >200.  Please return in 1.5 months with your sugar log.

## 2015-12-01 NOTE — Progress Notes (Signed)
Patient ID: Justin [name], male   DOB: [birth year], 81 y.o.   MRN: [id number]  HPI: MAXX PHAM is a 81 y.o.-year-old male, initially referred by his PCP, Dr.Shaw, for management of DM2, dx in 1980s, insulin-dependent since ~diagnosis, uncontrolled, with complications (hypoglycemia, PN). Last visit 2 mo ago.  He got a steroid inj in groin, and one in knee since last visit.   Reviewed hx: He had several lows in 05-09/2015 - lowest 40. In one occasion, he was found confused by family member and his sugars were 61 after given Sunny D. He had several visits to urgent care and ED for hypoglycemia. He also had unintentional weight loss: 10-15 lbs in Spring 2017. His daughter was telling me that the patient was not doing a good job eating, while trying to take care of his wife who has Alzheimer's disease. Now his niece comes and cooks for him. He has a h/o pancreatitis, but denied abdominal pain as a cause for his not eating well. Cost of Creon = high.   Last hemoglobin A1c was: Lab Results  Component Value Date   HGBA1C 7.5 09/06/2015   HGBA1C 8.6 05/20/2015   HGBA1C 8.2 08/12/2014   Pt is on a regimen of: - Levemir 50 units 2x a day >> 60 units daily >> 44 units hs (will switch to Lantus per insurance pref.) - Metformin 1000 mg 2x a day - Glipizide XL 2.5 mg before dinner (did not take it in am as suggested!!!!)  Pt checks his sugars 2-3x a day: low in am and higher at night: - am: 205, 233 >> 50-109, 144 - 2h after b'fast: n/c - before lunch: 171 >> 85, 111-261 - 2h after lunch: n/c - before dinner: 176 >> 121, 140-284 - 2h after dinner: n/c - bedtime: 196 >> 162-337 - nighttime: n/c + many lows. Lowest sugar was 50; he has hypoglycemia awareness at 70.  Highest sugar was 337.  Glucometer: AccuCheck [employer]  Pt's meals are: - Breakfast: PB crackers (nabs) + banana + diet soda; sausage and eggs - Lunch: muffin, cakes, tuna fish, fast food - Dinner: pork chops, beans, greens  now - Snacks: crackers, PB Now niece goes to his house to cook.  - no CKD, last BUN/creatinine:  Lab Results  Component Value Date   BUN 17 09/25/2015   BUN 14 09/20/2015   CREATININE 0.97 09/25/2015   CREATININE 0.94 09/20/2015   - last set of lipids: Lab Results  Component Value Date   CHOL 98 (L) 09/20/2015   HDL 54 09/20/2015   LDLCALC 34 09/20/2015   TRIG 49 09/20/2015   CHOLHDL 1.8 09/20/2015   - last eye exam was in >45yr No DR.  - + numbness and tingling in his feet.  ROS: Constitutional: + Weight loss, + decreased appetite, no fatigue, + hot flashes, + Nocturia Eyes: no blurry vision, no xerophthalmia ENT: no sore throat, no nodules palpated in throat, no dysphagia/odynophagia, no hoarseness Cardiovascular: no CP/SOB/palpitations/leg swelling Respiratory: no cough/no SOB Gastrointestinal: no N/V/D/C/+ heartburn Musculoskeletal: + Both: muscle/joint aches Skin: no rashes Neurological: no tremors/numbness/tingling/dizziness  I reviewed pt's medications, allergies, PMH, social hx, family hx, and changes were documented in the history of present illness. Otherwise, unchanged from my initial visit note.  Past Medical History:  Diagnosis Date  . Arthritis   . Diabetes mellitus   . HTN (hypertension)   . Hypercholesteremia   . Iron deficiency   . Pancreatitis, acute 06/02/2012   Diagnosed 05/2012   .  Personal history of colonic adenomas 04/15/2013   04/15/2013 2 diminutive polyps    . Stroke ([place])   . TIA (transient ischemic attack)    Past Surgical History:  Procedure Laterality Date  . ARTERY BIOPSY  02/26/2011   Procedure: BIOPSY TEMPORAL ARTERY;  Surgeon: Willey Blade, MD;  Location: [facility];  Service: General;  Laterality: Left;  Left temporal artery biospy  . BACK SURGERY  1985  . CHOLECYSTECTOMY    . COLONOSCOPY    . EUS    . NECK SURGERY  1985  . TEMPORAL ARTERY BIOPSY / LIGATION  02/26/2011   left side  . UPPER  GASTROINTESTINAL ENDOSCOPY     Social History   Social History  . Marital status: Married    Spouse name: N/A  . Number of children: 6  . Years of education: N/A   Occupational History  . Not on file.   Social History Main Topics  . Smoking status: Former Smoker    Years: 35.00    Types: Cigarettes    Quit date: 04/09/1990  . Smokeless tobacco: Never Used  . Alcohol use No     Comment: "per pt stopped drinking in the 90's"  . Drug use: No  . Sexual activity: Not on file   Other Topics Concern  . Not on file   Social History Narrative  . No narrative on file   Current Outpatient Prescriptions on File Prior to Visit  Medication Sig Dispense Refill  . Alcohol Swabs PADS Use to check blood sugar 3 times daily. Dx: E10.9 300 each 3  . Blood Glucose Calibration (ACCU-CHEK AVIVA) SOLN Use to check blood sugar 3 times daily. Dx: E10.9 1 each 3  . Blood Glucose Monitoring Suppl (ACCU-CHEK AVIVA PLUS) w/Device KIT Use to check blood sugar 3 times daily. Dx: E10.9 1 kit 0  . fish oil-omega-3 fatty acids 1000 MG capsule Take 2 g by mouth daily.      Marland Kitchen glipiZIDE (GLUCOTROL XL) 2.5 MG 24 hr tablet Take 1 tablet (2.5 mg total) by mouth daily with breakfast. 60 tablet 1  . glucose blood (ACCU-CHEK AVIVA PLUS) test strip Use to check blood sugar 3 times daily. Dx: E10.9 300 each 3  . Insulin Glargine (LANTUS SOLOSTAR) 100 UNIT/ML Solostar Pen Inject 45 Units into the skin daily at 10 pm. 5 pen 1  . Insulin Pen Needle 32G X 4 MM MISC Use as directed to inject insulin tidwc 100 each 11  . Lancets (ACCU-CHEK SOFT TOUCH) lancets Use to check blood sugar 3 times daily. Dx: E10.9 300 each 3  . lipase/protease/amylase (CREON) 12000 units CPEP capsule TAKE TWO CAPSULES BY MOUTH THREE TIMES DAILY WITH MEALS 540 capsule 1  . metFORMIN (GLUCOPHAGE) 1000 MG tablet TAKE 1 TABLET TWICE DAILY WITH MEALS (NEED MD APPOINTMENT) 180 tablet 0  . Multiple Vitamins-Minerals (MULTIVITAMIN WITH MINERALS) tablet Take  1 tablet by mouth daily.      Marland Kitchen omeprazole (PRILOSEC) 40 MG capsule TAKE 1 CAPSULE EVERY DAY 90 capsule 3  . atorvastatin (LIPITOR) 40 MG tablet TAKE 1 TABLET EVERY DAY (Patient not taking: Reported on 12/01/2015) 90 tablet 1  . clopidogrel (PLAVIX) 75 MG tablet TAKE 1 TABLET EVERY DAY (Patient not taking: Reported on 12/01/2015) 90 tablet 3  . glucagon (GLUCAGON EMERGENCY) 1 MG injection Inject 1 mg into the muscle once as needed. (Patient not taking: Reported on 12/01/2015) 1 each 12  . Ipratropium-Albuterol (COMBIVENT) 20-100 MCG/ACT AERS respimat Inhale  1 puff into the lungs every 6 (six) hours. (Patient not taking: Reported on 12/01/2015) 4 g 3  . l-methylfolate-B6-B12 (METANX) 3-35-2 MG TABS Take 1 tablet by mouth daily.      Marland Kitchen Spacer/Aero-Holding Dorise Bullion Use as directed with combivent. Ok to dispense any spacer that works with the inhaler being rxed to pt. (Patient not taking: Reported on 12/01/2015) 1 each 1  . [DISCONTINUED] testosterone cypionate (DEPOTESTOTERONE CYPIONATE) 200 MG/ML injection Inject into the muscle every 14 (fourteen) days.     Current Facility-Administered Medications on File Prior to Visit  Medication Dose Route Frequency Provider Last Rate Last Dose  . testosterone cypionate (DEPOTESTOTERONE CYPIONATE) injection 200 mg  200 mg Intramuscular Q14 Days Collene Leyden, PA-C   200 mg at 04/05/13 6314   No Known Allergies Family History  Problem Relation Age of Onset  . Heart disease Mother   . Diabetes Sister     x 2  . Heart attack Sister   . Amblyopia Father    PE: BP 118/68 (BP Location: Left Arm, Patient Position: Sitting)   Pulse 99   Ht '5\' 7"'  (1.702 m)   Wt 180 lb (81.6 kg)   SpO2 96%   BMI 28.19 kg/m  Body mass index is 28.19 kg/m. Wt Readings from Last 3 Encounters:  12/01/15 180 lb (81.6 kg)  09/29/15 183 lb 6.4 oz (83.2 kg)  09/27/15 185 lb 6.4 oz (84.1 kg)   Constitutional: Normal weight, in NAD, in wheelchair Eyes: PERRLA, EOMI, no  exophthalmos ENT: moist mucous membranes, no thyromegaly, no cervical lymphadenopathy Cardiovascular: Tachycardia, RR, No MRG Respiratory: CTA B Gastrointestinal: abdomen soft, NT, ND, BS+ Musculoskeletal: no deformities, strength intact in all 4 Skin: moist, warm, no rashes Neurological: no tremor with outstretched hands, DTR normal in all 4  ASSESSMENT: 1. DM2, insulin-dependent, uncontrolled, without complications  PLAN:  1. Patient with long-standing, uncontrolled diabetes, on oral antidiabetic regimen + Levemir. He was initially on a large dose of basal insulin, with subsequent low blood sugars in the last month (lowest 40). He is unclear what precipitated the lows, but he had an associated 10-15 pound weight loss and a low albumin level at the time of his ED visit, therefore, low caloric intake could  have contributed. Since then, his niece is going to their house and cooking for them.  - At last visit >> we started a low dose Glipizide but he started this before dinner rather than before b'fast >> sugars low in am and increase as the day goes by.  - He had few mild lows >> we decreased insulin further since last visit. - at last visit, we discussed about how to correct low blood sugars and also sent a prescription for a glucagon pen to his pharmacy. Her daughter and niece are CMA's and they know how to use the pen. He did not have to use it. - Regarding his DM tx, I would like to avoid mealtime insulin, but we do not have a lot of other options.   He has a history of pancreatitis, therefore, DPP 4 inhibitors or a GLP-1 receptor analogs are out of the question.   I do not feel that he is a good candidate for an SGLT 2 inhibitor.   With his coronary artery disease, I am also reticent to use Actos.   Cycloset will not help him a lot and I do not think it is covered by his insurance.  - I suggested to:  Patient  Instructions  Please continue Metformin 1000 mg 2x a day  Please move the  Levemir 44 units to morning.  Move Glipizide XL 2.5 mg to morning, before breakfast.  Please let me know if the sugars are consistently <80 or >200.  Please return in 1.5 months with your sugar log.   - continue checking sugars at different times of the day - check 2 times a day, rotating checks - advised for yearly eye exams  >> he needs one - HbA1c today: 7.9% (higher) - Return to clinic in 1.5 mo with sugar log   Philemon Kingdom, MD PhD [facility] Endocrinology

## 2015-12-22 ENCOUNTER — Other Ambulatory Visit: Payer: Self-pay | Admitting: Physician Assistant

## 2015-12-28 ENCOUNTER — Other Ambulatory Visit: Payer: Self-pay | Admitting: Physician Assistant

## 2015-12-28 ENCOUNTER — Other Ambulatory Visit: Payer: Self-pay

## 2015-12-28 MED ORDER — OMEPRAZOLE 40 MG PO CPDR: 40.0000 mg | DELAYED_RELEASE_CAPSULE | Freq: Every day | ORAL | 3 refills | Status: DC

## 2016-01-10 ENCOUNTER — Telehealth: Payer: Self-pay

## 2016-01-10 NOTE — Telephone Encounter (Signed)
Daughter, [name]  - is concerned about her fathers driving.   He is not alert and aware of his ability to drive.    She does not want her father to know she called and is concerned.     Has difficulty seeing and hearing   He needs an appointment w/o him knowing why.     [phone]

## 2016-01-18 ENCOUNTER — Encounter: Payer: Self-pay | Admitting: Internal Medicine

## 2016-01-18 ENCOUNTER — Ambulatory Visit (INDEPENDENT_AMBULATORY_CARE_PROVIDER_SITE_OTHER): Payer: Medicare HMO | Admitting: Internal Medicine

## 2016-01-18 VITALS — BP 146/60 | HR 102 | Temp 98.3°F | Resp 16 | Ht 67.0 in | Wt 179.0 lb

## 2016-01-18 DIAGNOSIS — E11649 Type 2 diabetes mellitus with hypoglycemia without coma: Secondary | ICD-10-CM

## 2016-01-18 DIAGNOSIS — Z794 Long term (current) use of insulin: Secondary | ICD-10-CM | POA: Diagnosis not present

## 2016-01-18 NOTE — Patient Instructions (Addendum)
Please continue: - Levemir 44 units in the morning. - Metformin 1000 mg 2x a day  Please increase: - Glipizide XL to 2.5 mg before breakfast.   Please let me know if the sugars are consistently <80 or >200.  Please return in 1.5 months with your sugar log.

## 2016-01-18 NOTE — Progress Notes (Signed)
Patient ID: Justin [name], male   DOB: May 29, 1934, 81 y.o.   MRN: [id number]  HPI: Justin [name] is a 81 y.o.-year-old male, initially referred by his PCP, Dr.Shaw, for management of DM2, dx in 1980s, insulin-dependent since ~diagnosis, uncontrolled, with complications (hypoglycemia, PN). Last visit 1.5 mo ago.  Reviewed hx: He had several lows in 05-09/2015 - lowest 40. In one occasion, he was found confused by family member and his sugars were 41 after given Sunny D. He had several visits to urgent care and ED for hypoglycemia. He also had unintentional weight loss: 10-15 lbs in Spring 2017. His daughter was telling me that the patient was not doing a good job eating, while trying to take care of his wife who has Alzheimer's disease. Now his niece comes and cooks for him. He has a h/o pancreatitis, but denied abdominal pain as a cause for his not eating well. Cost of Creon = high.   Last hemoglobin A1c was: Lab Results  Component Value Date   HGBA1C 7.9 12/01/2015   HGBA1C 7.5 09/06/2015   HGBA1C 8.6 05/20/2015   Pt is on a regimen of: - Levemir 50 units 2x a day >> 60 units daily >> 44 units hs >> am (will switch to Lantus per insurance pref.) - Metformin 1000 mg 2x a day - 1/2  Of a Glipizide XL 2.5 mg before dinner >> b'fast (did not understand to take the whole pill)  Pt checks his sugars 2-3x a day: low in am and higher at night: - am: 205, 233 >> 50-109, 144 >> 58x1, 61x1, 83-171, 233 (sugars 30-50 pts higher in 01/2016) - 2h after b'fast: n/c - before lunch: 171 >> 85, 111-261 >> n/c - 2h after lunch: n/c - before dinner: 176 >> 121, 140-284 >> 44x1, 65x1, 166-292, 341, 435x1 - 2h after dinner: n/c - bedtime: 196 >> 162-337 >> 165, 227 - nighttime: n/c + many lows. Lowest sugar was 50 >> 44 x1; he has hypoglycemia awareness at 70.  Highest sugar was 337 >> 435.  Glucometer: AccuCheck [employer]  Pt's meals are: - Breakfast: PB crackers (nabs) + banana + diet soda; sausage  and eggs - Lunch: muffin, cakes, tuna fish, fast food - Dinner: pork chops, beans, greens now - Snacks: crackers, PB Now niece goes to his house to cook.  - no CKD, last BUN/creatinine:  Lab Results  Component Value Date   BUN 17 09/25/2015   BUN 14 09/20/2015   CREATININE 0.97 09/25/2015   CREATININE 0.94 09/20/2015   - last set of lipids: Lab Results  Component Value Date   CHOL 98 (L) 09/20/2015   HDL 54 09/20/2015   LDLCALC 34 09/20/2015   TRIG 49 09/20/2015   CHOLHDL 1.8 09/20/2015   - last eye exam was in 2016. No DR. Needs to have a 45$ copay to return. - + numbness and tingling in his feet.  ROS: Constitutional: + Weight loss, + decreased appetite, no fatigue, + hot flashes, + Nocturia Eyes: no blurry vision, no xerophthalmia ENT: no sore throat, no nodules palpated in throat, no dysphagia/odynophagia, no hoarseness Cardiovascular: no CP/SOB/palpitations/leg swelling Respiratory: no cough/no SOB Gastrointestinal: no N/V/D/C/heartburn Musculoskeletal: + Both: muscle/joint aches Skin: no rashes Neurological: no tremors/numbness/tingling/dizziness  I reviewed pt's medications, allergies, PMH, social hx, family hx, and changes were documented in the history of present illness. Otherwise, unchanged from my initial visit note.  Past Medical History:  Diagnosis Date  . Arthritis   . Diabetes  mellitus   . HTN (hypertension)   . Hypercholesteremia   . Iron deficiency   . Pancreatitis, acute 06/02/2012   Diagnosed 05/2012   . Personal history of colonic adenomas 04/15/2013   04/15/2013 2 diminutive polyps    . Stroke ([place])   . TIA (transient ischemic attack)    Past Surgical History:  Procedure Laterality Date  . ARTERY BIOPSY  02/26/2011   Procedure: BIOPSY TEMPORAL ARTERY;  Surgeon: Willey Blade, MD;  Location: [facility];  Service: General;  Laterality: Left;  Left temporal artery biospy  . BACK SURGERY  1985  . CHOLECYSTECTOMY    .  COLONOSCOPY    . EUS    . NECK SURGERY  1985  . TEMPORAL ARTERY BIOPSY / LIGATION  02/26/2011   left side  . UPPER GASTROINTESTINAL ENDOSCOPY     Social History   Social History  . Marital status: Married    Spouse name: N/A  . Number of children: 6  . Years of education: N/A   Occupational History  . Not on file.   Social History Main Topics  . Smoking status: Former Smoker    Years: 35.00    Types: Cigarettes    Quit date: 04/09/1990  . Smokeless tobacco: Never Used  . Alcohol use No     Comment: "per pt stopped drinking in the 90's"  . Drug use: No  . Sexual activity: Not on file   Other Topics Concern  . Not on file   Social History Narrative  . No narrative on file   Current Outpatient Prescriptions on File Prior to Visit  Medication Sig Dispense Refill  . Alcohol Swabs PADS Use to check blood sugar 3 times daily. Dx: E10.9 300 each 3  . atorvastatin (LIPITOR) 40 MG tablet TAKE 1 TABLET EVERY DAY (Patient not taking: Reported on 12/01/2015) 90 tablet 1  . Blood Glucose Calibration (ACCU-CHEK AVIVA) SOLN Use to check blood sugar 3 times daily. Dx: E10.9 1 each 3  . Blood Glucose Monitoring Suppl (ACCU-CHEK AVIVA PLUS) w/Device KIT Use to check blood sugar 3 times daily. Dx: E10.9 1 kit 0  . clopidogrel (PLAVIX) 75 MG tablet TAKE 1 TABLET EVERY DAY (Patient not taking: Reported on 12/01/2015) 90 tablet 3  . fish oil-omega-3 fatty acids 1000 MG capsule Take 2 g by mouth daily.      Marland Kitchen glipiZIDE (GLUCOTROL XL) 2.5 MG 24 hr tablet Take 1 tablet (2.5 mg total) by mouth daily with breakfast. 90 tablet 3  . glucagon (GLUCAGON EMERGENCY) 1 MG injection Inject 1 mg into the muscle once as needed. (Patient not taking: Reported on 12/01/2015) 1 each 12  . glucose blood (ACCU-CHEK AVIVA PLUS) test strip Use to check blood sugar 3 times daily. Dx: E10.9 300 each 3  . Insulin Glargine (LANTUS SOLOSTAR) 100 UNIT/ML Solostar Pen Inject 45 Units into the skin daily at 10 pm. 5 pen 1  .  Insulin Pen Needle 32G X 4 MM MISC Use as directed to inject insulin tidwc 100 each 11  . Ipratropium-Albuterol (COMBIVENT) 20-100 MCG/ACT AERS respimat Inhale 1 puff into the lungs every 6 (six) hours. (Patient not taking: Reported on 12/01/2015) 4 g 3  . l-methylfolate-B6-B12 (METANX) 3-35-2 MG TABS Take 1 tablet by mouth daily.      . Lancets (ACCU-CHEK SOFT TOUCH) lancets Use to check blood sugar 3 times daily. Dx: E10.9 300 each 3  . lipase/protease/amylase (CREON) 12000 units CPEP capsule TAKE TWO CAPSULES  BY MOUTH THREE TIMES DAILY WITH MEALS 540 capsule 1  . metFORMIN (GLUCOPHAGE) 1000 MG tablet TAKE 1 TABLET TWICE DAILY WITH MEALS (NEED MD APPOINTMENT) 180 tablet 0  . Multiple Vitamins-Minerals (MULTIVITAMIN WITH MINERALS) tablet Take 1 tablet by mouth daily.      Marland Kitchen omeprazole (PRILOSEC) 40 MG capsule Take 1 capsule (40 mg total) by mouth daily. 90 capsule 3  . Spacer/Aero-Holding Dorise Bullion Use as directed with combivent. Ok to dispense any spacer that works with the inhaler being rxed to pt. (Patient not taking: Reported on 12/01/2015) 1 each 1  . traMADol (ULTRAM) 50 MG tablet Take 50 mg by mouth.    . [DISCONTINUED] testosterone cypionate (DEPOTESTOTERONE CYPIONATE) 200 MG/ML injection Inject into the muscle every 14 (fourteen) days.     Current Facility-Administered Medications on File Prior to Visit  Medication Dose Route Frequency Provider Last Rate Last Dose  . testosterone cypionate (DEPOTESTOTERONE CYPIONATE) injection 200 mg  200 mg Intramuscular Q14 Days Collene Leyden, PA-C   200 mg at 04/05/13 7124   No Known Allergies Family History  Problem Relation Age of Onset  . Heart disease Mother   . Diabetes Sister     x 2  . Heart attack Sister   . Amblyopia Father    PE: BP (!) 146/60   Pulse (!) 102   Temp 98.3 F (36.8 C)   Resp 16   Ht '5\' 7"'  (1.702 m)   Wt 179 lb (81.2 kg)   SpO2 96%   BMI 28.04 kg/m  Body mass index is 28.04 kg/m. Wt Readings from Last 3  Encounters:  01/18/16 179 lb (81.2 kg)  12/01/15 180 lb (81.6 kg)  09/29/15 183 lb 6.4 oz (83.2 kg)   Constitutional: Normal weight, in NAD, walks with cane Eyes: PERRLA, EOMI, no exophthalmos ENT: moist mucous membranes, no thyromegaly, no cervical lymphadenopathy Cardiovascular: Tachycardia, RR, No MRG Respiratory: CTA B Gastrointestinal: abdomen soft, NT, ND, BS+ Musculoskeletal: no deformities, strength intact in all 4 Skin: moist, warm, no rashes Neurological: no tremor with outstretched hands, DTR normal in all 4  ASSESSMENT: 1. DM2, insulin-dependent, uncontrolled, without complications  PLAN:  1. Patient with long-standing, uncontrolled diabetes, on oral antidiabetic regimen + Levemir. He was initially on a large dose of basal insulin, with subsequent low blood sugars in the last month (lowest CBG: 40). It is unclear what precipitated the lows, but he had an associated 10-15 pound weight loss and a low albumin level at the time of his ED visit, therefore, low caloric intake could  have contributed. Since then, his niece is going to their house and cooking for them.  - At our first visit, I reluctantly started a low dose Glipizide (due to concern for lows -but we did not have many other options) >>  on this regimen, he had few mild lows >> we decreased insulin further. At this visit, his sugars worsened In last 2 weeks, possibly because he started eating more grits (no other culprit identified). Upon questioning, he is taking half of a 2.5 mg glipizide ER instead of the whole tablet (misunderstood instructions) >> Therefore, I would suggest to increase this to the target dose of 2.5 mg before breakfast for now. - Regarding his DM tx,  if he needs intensification of treatment, we do not have many options:    mealtime insulin - I would like to avoid this, but we may not be able to...  He has a history of pancreatitis, therefore,  DPP 4 inhibitors or a GLP-1 receptor analogs are out of  the question.   I do not feel that he is a good candidate for an SGLT 2 inhibitor.   With his coronary artery disease, I am also reticent to use Actos.   Cycloset will not help him a lot and I do not think it is covered by his insurance.  - he does have a glucagon pen at home. Her daughter and niece are CMA's and they know how to use the pen. He did not have to use it. - last HbA1c 7.9% (higher) - I suggested to:  Patient Instructions  Please continue: - Levemir 44 units in the morning. - Metformin 1000 mg 2x a day  Please increase: - Glipizide XL to 2.5 mg before breakfast.   Please let me know if the sugars are consistently <80 or >200.  Please return in 1.5 months with your sugar log.    - continue checking sugars at different times of the day - check 2 times a day, rotating checks - advised for yearly eye exams  >> he needs one - refuses flu shot today >> "I get sick from this" - Return to clinic in 1.5 mo with sugar log   Philemon Kingdom, MD PhD [facility] Endocrinology

## 2016-03-06 ENCOUNTER — Ambulatory Visit: Payer: Medicare HMO | Admitting: Internal Medicine

## 2016-03-07 ENCOUNTER — Ambulatory Visit: Payer: Medicare HMO | Admitting: Internal Medicine

## 2016-03-09 ENCOUNTER — Encounter: Payer: Self-pay | Admitting: Internal Medicine

## 2016-03-09 ENCOUNTER — Telehealth: Payer: Self-pay | Admitting: Internal Medicine

## 2016-03-09 ENCOUNTER — Ambulatory Visit (INDEPENDENT_AMBULATORY_CARE_PROVIDER_SITE_OTHER): Payer: Medicare HMO | Admitting: Internal Medicine

## 2016-03-09 VITALS — BP 134/76 | HR 74 | Wt 179.0 lb

## 2016-03-09 DIAGNOSIS — Z794 Long term (current) use of insulin: Secondary | ICD-10-CM

## 2016-03-09 DIAGNOSIS — E1121 Type 2 diabetes mellitus with diabetic nephropathy: Secondary | ICD-10-CM

## 2016-03-09 DIAGNOSIS — E11649 Type 2 diabetes mellitus with hypoglycemia without coma: Secondary | ICD-10-CM

## 2016-03-09 LAB — POCT GLYCOSYLATED HEMOGLOBIN (HGB A1C): Hemoglobin A1C: 9.9

## 2016-03-09 MED ORDER — INSULIN ASPART PROT & ASPART (70-30 MIX) 100 UNIT/ML ~~LOC~~ SUSP: 30.0000 [IU] | Freq: Two times a day (BID) | SUBCUTANEOUS | 11 refills | Status: DC

## 2016-03-09 NOTE — Telephone Encounter (Addendum)
Requested a call back back from the patient to discuss.

## 2016-03-09 NOTE — Telephone Encounter (Signed)
°  Patient stated this is what he is taking   Novalog mix Flex Pen Levemir Flex Touch  Lantus solostar 100 units 3 ml  Please asdvise

## 2016-03-09 NOTE — Telephone Encounter (Signed)
See message to be advised.  

## 2016-03-09 NOTE — Patient Instructions (Addendum)
Please stop the insulin you are using now and start 70/30 insulin 15 min before breakfast and dinner: - 40 units before b'fast - 30 units before dinner  Please stop Glipizide.  Please continue Metformin 1000 mg 2x a day with meals.  Let me know how sugars are doing in ~ 2 weeks.  Please return in 1.5 months with your sugar log.

## 2016-03-09 NOTE — Progress Notes (Signed)
Patient ID: Justin [name], male   DOB: 07-16-1934, 81 y.o.   MRN: [id number]  HPI: Justin [name] is a 81 y.o.-year-old male, initially referred by his PCP, Dr.Shaw, for management of DM2, dx in 1980s, insulin-dependent since ~diagnosis, uncontrolled, with complications (hypoglycemia, PN). Last visit 1.5 mo ago.  Reviewed hx: He had several lows in 05-09/2015 - lowest 40. In one occasion, he was found confused by family member and his sugars were 41 after given Sunny D. He had several visits to urgent care and ED for hypoglycemia. He also had unintentional weight loss: 10-15 lbs in Spring 2017. His daughter was telling me that the patient was not doing a good job eating, while trying to take care of his wife who has Alzheimer's disease. Now his niece comes and cooks for him. He has a h/o pancreatitis, but denied abdominal pain as a cause for his not eating well. Cost of Creon = high.   Last hemoglobin A1c was: Lab Results  Component Value Date   HGBA1C 7.9 12/01/2015   HGBA1C 7.5 09/06/2015   HGBA1C 8.6 05/20/2015   Pt is on a regimen of: - Lantus 50 units 2x a day >> 60 units daily >> 44 units hs >> am (Levemir not covered anymore - he now has a "black pen" - ???) - Metformin 1000 mg 2x a day - Glipizide XL 2.5 mg before breakfast.   Pt checks his sugars 2x a day: MUCH higher than before: - am: 205, 233 >> 50-109, 144 >> 58x1, 61x1, 83-171, 233 (sugars 30-50 pts higher in 01/2016) >> 132, 171-322 - 2h after b'fast: n/c >> 139-206, 419 - before lunch: 171 >> 85, 111-261 >> n/c  - 2h after lunch: n/c - before dinner: 176 >> 121, 140-284 >> 44x1, 65x1, 166-292, 341, 435x1 >> 207-262 - 2h after dinner: n/c - bedtime: 196 >> 162-337 >> 165, 227 >> 162-543 - nighttime: n/c >> 418-540 + many lows. Lowest sugar was 50 >> 44 x1 >> 139; he has hypoglycemia awareness at 70.  Highest sugar was 337 >> 435 >> 500s.  Glucometer: AccuCheck [employer]  Pt's meals are: - Breakfast: PB crackers  (nabs) + banana + diet soda; sausage and eggs - Lunch: muffin, cakes, tuna fish, fast food - Dinner: pork chops, beans, greens now - Snacks: crackers, PB Now niece goes to his house to cook.  - no CKD, last BUN/creatinine:  Lab Results  Component Value Date   BUN 17 09/25/2015   BUN 14 09/20/2015   CREATININE 0.97 09/25/2015   CREATININE 0.94 09/20/2015   - last set of lipids: Lab Results  Component Value Date   CHOL 98 (L) 09/20/2015   HDL 54 09/20/2015   LDLCALC 34 09/20/2015   TRIG 49 09/20/2015   CHOLHDL 1.8 09/20/2015   - last eye exam was in 2016. No DR. Needs to have a 45$ copay to return. - + numbness and tingling in his feet.  ROS: Constitutional: no Weight loss, + decreased appetite, no fatigue Eyes: no blurry vision, no xerophthalmia ENT: no sore throat, no nodules palpated in throat, no dysphagia/odynophagia, no hoarseness Cardiovascular: no CP/SOB/palpitations/leg swelling Respiratory: no cough/no SOB Gastrointestinal: no N/V/D/C/heartburn Musculoskeletal: + Both: muscle/joint aches Skin: no rashes Neurological: no tremors/numbness/tingling/dizziness  I reviewed pt's medications, allergies, PMH, social hx, family hx, and changes were documented in the history of present illness. Otherwise, unchanged from my initial visit note.  Past Medical History:  Diagnosis Date  . Arthritis   .  Diabetes mellitus   . HTN (hypertension)   . Hypercholesteremia   . Iron deficiency   . Pancreatitis, acute 06/02/2012   Diagnosed 05/2012   . Personal history of colonic adenomas 04/15/2013   04/15/2013 2 diminutive polyps    . Stroke ([place])   . TIA (transient ischemic attack)    Past Surgical History:  Procedure Laterality Date  . ARTERY BIOPSY  02/26/2011   Procedure: BIOPSY TEMPORAL ARTERY;  Surgeon: Willey Blade, MD;  Location: [facility];  Service: General;  Laterality: Left;  Left temporal artery biospy  . BACK SURGERY  1985  . CHOLECYSTECTOMY     . COLONOSCOPY    . EUS    . NECK SURGERY  1985  . TEMPORAL ARTERY BIOPSY / LIGATION  02/26/2011   left side  . UPPER GASTROINTESTINAL ENDOSCOPY     Social History   Social History  . Marital status: Married    Spouse name: N/A  . Number of children: 6  . Years of education: N/A   Occupational History  . Not on file.   Social History Main Topics  . Smoking status: Former Smoker    Years: 35.00    Types: Cigarettes    Quit date: 04/09/1990  . Smokeless tobacco: Never Used  . Alcohol use No     Comment: "per pt stopped drinking in the 90's"  . Drug use: No  . Sexual activity: Not on file   Other Topics Concern  . Not on file   Social History Narrative  . No narrative on file   Current Outpatient Prescriptions on File Prior to Visit  Medication Sig Dispense Refill  . Alcohol Swabs PADS Use to check blood sugar 3 times daily. Dx: E10.9 300 each 3  . atorvastatin (LIPITOR) 40 MG tablet TAKE 1 TABLET EVERY DAY 90 tablet 1  . Blood Glucose Calibration (ACCU-CHEK AVIVA) SOLN Use to check blood sugar 3 times daily. Dx: E10.9 1 each 3  . Blood Glucose Monitoring Suppl (ACCU-CHEK AVIVA PLUS) w/Device KIT Use to check blood sugar 3 times daily. Dx: E10.9 1 kit 0  . clopidogrel (PLAVIX) 75 MG tablet TAKE 1 TABLET EVERY DAY 90 tablet 3  . fish oil-omega-3 fatty acids 1000 MG capsule Take 2 g by mouth daily.      Marland Kitchen glipiZIDE (GLUCOTROL XL) 2.5 MG 24 hr tablet Take 1 tablet (2.5 mg total) by mouth daily with breakfast. 90 tablet 3  . glucagon (GLUCAGON EMERGENCY) 1 MG injection Inject 1 mg into the muscle once as needed. 1 each 12  . glucose blood (ACCU-CHEK AVIVA PLUS) test strip Use to check blood sugar 3 times daily. Dx: E10.9 300 each 3  . Insulin Glargine (LANTUS SOLOSTAR) 100 UNIT/ML Solostar Pen Inject 45 Units into the skin daily at 10 pm. 5 pen 1  . Insulin Pen Needle 32G X 4 MM MISC Use as directed to inject insulin tidwc 100 each 11  . Ipratropium-Albuterol (COMBIVENT)  20-100 MCG/ACT AERS respimat Inhale 1 puff into the lungs every 6 (six) hours. 4 g 3  . l-methylfolate-B6-B12 (METANX) 3-35-2 MG TABS Take 1 tablet by mouth daily.      . Lancets (ACCU-CHEK SOFT TOUCH) lancets Use to check blood sugar 3 times daily. Dx: E10.9 300 each 3  . lipase/protease/amylase (CREON) 12000 units CPEP capsule TAKE TWO CAPSULES BY MOUTH THREE TIMES DAILY WITH MEALS 540 capsule 1  . metFORMIN (GLUCOPHAGE) 1000 MG tablet TAKE 1 TABLET TWICE DAILY WITH  MEALS (NEED MD APPOINTMENT) 180 tablet 0  . Multiple Vitamins-Minerals (MULTIVITAMIN WITH MINERALS) tablet Take 1 tablet by mouth daily.      Marland Kitchen omeprazole (PRILOSEC) 40 MG capsule Take 1 capsule (40 mg total) by mouth daily. 90 capsule 3  . Spacer/Aero-Holding Dorise Bullion Use as directed with combivent. Ok to dispense any spacer that works with the inhaler being rxed to pt. 1 each 1  . traMADol (ULTRAM) 50 MG tablet Take 50 mg by mouth.    . [DISCONTINUED] testosterone cypionate (DEPOTESTOTERONE CYPIONATE) 200 MG/ML injection Inject into the muscle every 14 (fourteen) days.     Current Facility-Administered Medications on File Prior to Visit  Medication Dose Route Frequency Provider Last Rate Last Dose  . testosterone cypionate (DEPOTESTOTERONE CYPIONATE) injection 200 mg  200 mg Intramuscular Q14 Days Collene Leyden, PA-C   200 mg at 04/05/13 3382   No Known Allergies Family History  Problem Relation Age of Onset  . Heart disease Mother   . Diabetes Sister     x 2  . Heart attack Sister   . Amblyopia Father    PE: BP 134/76   Pulse 74   Wt 179 lb (81.2 kg)   BMI 28.04 kg/m  Body mass index is 28.04 kg/m. Wt Readings from Last 3 Encounters:  03/09/16 179 lb (81.2 kg)  01/18/16 179 lb (81.2 kg)  12/01/15 180 lb (81.6 kg)   Constitutional: Normal weight, in NAD, walks with cane Eyes: PERRLA, EOMI, no exophthalmos ENT: moist mucous membranes, no thyromegaly, no cervical lymphadenopathy Cardiovascular: Tachycardia,  RR, No MRG Respiratory: CTA B Gastrointestinal: abdomen soft, NT, ND, BS+ Musculoskeletal: no deformities, strength intact in all 4 Skin: moist, warm, no rashes Neurological: no tremor with outstretched hands, DTR normal in all 4  ASSESSMENT: 1. DM2, insulin-dependent, uncontrolled, without complications - we do not have many options:    He has a history of pancreatitis, therefore, DPP 4 inhibitors or a GLP-1 receptor analogs are out of the question.   I do not feel that he is a good candidate for an SGLT 2 inhibitor.   With his coronary artery disease, I am also reticent to use Actos.   Cycloset will not help him a lot and I do not think it is covered by his insurance.   PLAN:  1. Patient with long-standing, uncontrolled diabetes, on oral antidiabetic regimen + prev. Levemir, now a "black pen". Sugars are much worse c/w last visit >> I advised him to call me back to see exactly what insulin he is taking. Regardless, we need to switch t0 7/30 insulin for better coverage as his sugars are so high now. Will stop Glipizide. - HbA1c today: 9.9% (higher) - I suggested to:  Patient Instructions  Please stop the insulin you are using now and start 70/30 insulin 15 min before breakfast and dinner: - 40 units before b'fast - 30 units before dinner  Please stop Glipizide.  Please continue Metformin 1000 mg 2x a day with meals.  Let me know how sugars are doing in ~ 2 weeks.  Please return in 1.5 months with your sugar log.    - continue checking sugars at different times of the day - check 2 times a day, rotating checks - advised for yearly eye exams  >> he needs one >> will schedule, now that he has supplemental insurance ([facility]) - refused flu shot  >> "I get sick from this" - Return to clinic in 1.5 mo with sugar  log   Philemon Kingdom, MD PhD [facility] Endocrinology

## 2016-03-09 NOTE — Telephone Encounter (Signed)
He mentioned that he is using a black pen. The message below did not clarify which one as none are black... Please advise him to switch to the regimen that we discussed about at today's visit.

## 2016-05-01 ENCOUNTER — Telehealth: Payer: Self-pay | Admitting: Internal Medicine

## 2016-05-01 ENCOUNTER — Ambulatory Visit: Payer: Medicare HMO | Admitting: Internal Medicine

## 2016-05-01 NOTE — Telephone Encounter (Signed)
No show

## 2016-06-20 ENCOUNTER — Other Ambulatory Visit: Payer: Self-pay | Admitting: Family Medicine

## 2016-06-21 NOTE — Telephone Encounter (Signed)
Left message with the patient's son to schedule visit before this supply is exhausted.  Meds ordered this encounter  Medications  . CREON 12000 units CPEP capsule    Sig: TAKE TWO CAPSULES BY MOUTH THREE TIMES DAILY WITH MEALS    Dispense:  180 capsule    Refill:  2    Please notify patient that s/he needs an office visit +/- labsfor additional refills.

## 2016-10-01 ENCOUNTER — Other Ambulatory Visit: Payer: Self-pay | Admitting: Internal Medicine

## 2016-10-01 NOTE — Telephone Encounter (Signed)
Please advise, I believe this was refilled by Dr.Gherghe previously by mistake and she is out of the office until July 9th so I am unable to check with her.

## 2016-10-01 NOTE — Telephone Encounter (Signed)
Pt is long-overdue for a f/u OV. Please have him sched an appt within the next 3 months so we can continue to make sure the medicines are keeping him healthy and safe before we refill them further. Thanks.

## 2016-10-02 NOTE — Telephone Encounter (Signed)
Sent patient a letter in the mail about making an appt for medication refills--pt does have a balance that will have to be taken care of or set up payment plans before we can make the appt.

## 2016-10-23 ENCOUNTER — Other Ambulatory Visit: Payer: Self-pay | Admitting: Family Medicine

## 2016-11-30 ENCOUNTER — Other Ambulatory Visit: Payer: Self-pay | Admitting: Physician Assistant

## 2016-12-17 ENCOUNTER — Other Ambulatory Visit: Payer: Self-pay | Admitting: Physician Assistant

## 2017-02-07 ENCOUNTER — Other Ambulatory Visit: Payer: Self-pay | Admitting: Family Medicine

## 2017-02-20 ENCOUNTER — Other Ambulatory Visit: Payer: Self-pay | Admitting: Family Medicine

## 2018-01-28 ENCOUNTER — Encounter (HOSPITAL_BASED_OUTPATIENT_CLINIC_OR_DEPARTMENT_OTHER): Payer: Self-pay | Admitting: *Deleted

## 2018-01-28 ENCOUNTER — Emergency Department (HOSPITAL_BASED_OUTPATIENT_CLINIC_OR_DEPARTMENT_OTHER)
Admission: EM | Admit: 2018-01-28 | Discharge: 2018-01-28 | Disposition: A | Discharge status: Home / Self Care | Payer: Medicare Other | Attending: Emergency Medicine | Admitting: Emergency Medicine

## 2018-01-28 ENCOUNTER — Other Ambulatory Visit: Payer: Self-pay

## 2018-01-28 DIAGNOSIS — F0390 Unspecified dementia without behavioral disturbance: Secondary | ICD-10-CM | POA: Insufficient documentation

## 2018-01-28 DIAGNOSIS — Z87891 Personal history of nicotine dependence: Secondary | ICD-10-CM | POA: Insufficient documentation

## 2018-01-28 DIAGNOSIS — B029 Zoster without complications: Secondary | ICD-10-CM | POA: Diagnosis not present

## 2018-01-28 DIAGNOSIS — I2510 Atherosclerotic heart disease of native coronary artery without angina pectoris: Secondary | ICD-10-CM | POA: Diagnosis not present

## 2018-01-28 DIAGNOSIS — Z794 Long term (current) use of insulin: Secondary | ICD-10-CM | POA: Diagnosis not present

## 2018-01-28 DIAGNOSIS — Z8673 Personal history of transient ischemic attack (TIA), and cerebral infarction without residual deficits: Secondary | ICD-10-CM | POA: Diagnosis not present

## 2018-01-28 DIAGNOSIS — Z79899 Other long term (current) drug therapy: Secondary | ICD-10-CM | POA: Diagnosis not present

## 2018-01-28 DIAGNOSIS — I10 Essential (primary) hypertension: Secondary | ICD-10-CM | POA: Insufficient documentation

## 2018-01-28 DIAGNOSIS — Z7902 Long term (current) use of antithrombotics/antiplatelets: Secondary | ICD-10-CM | POA: Diagnosis not present

## 2018-01-28 DIAGNOSIS — E119 Type 2 diabetes mellitus without complications: Secondary | ICD-10-CM

## 2018-01-28 DIAGNOSIS — E11649 Type 2 diabetes mellitus with hypoglycemia without coma: Secondary | ICD-10-CM | POA: Insufficient documentation

## 2018-01-28 LAB — CBC WITH DIFFERENTIAL/PLATELET
ABS IMMATURE GRANULOCYTES: 0.07 10*3/uL (ref 0.00–0.07)
Basophils Absolute: 0.1 10*3/uL (ref 0.0–0.1)
Basophils Relative: 1 %
Eosinophils Absolute: 0.1 10*3/uL (ref 0.0–0.5)
Eosinophils Relative: 1 %
HCT: 38.2 % — ABNORMAL LOW (ref 39.0–52.0)
HEMOGLOBIN: 12.5 g/dL — AB (ref 13.0–17.0)
Immature Granulocytes: 1 %
LYMPHS PCT: 23 %
Lymphs Abs: 2.5 10*3/uL (ref 0.7–4.0)
MCH: 28.7 pg (ref 26.0–34.0)
MCHC: 32.7 g/dL (ref 30.0–36.0)
MCV: 87.8 fL (ref 80.0–100.0)
MONO ABS: 0.8 10*3/uL (ref 0.1–1.0)
MONOS PCT: 8 %
NEUTROS ABS: 7.3 10*3/uL (ref 1.7–7.7)
Neutrophils Relative %: 66 %
Platelets: 443 10*3/uL — ABNORMAL HIGH (ref 150–400)
RBC: 4.35 MIL/uL (ref 4.22–5.81)
RDW: 14.1 % (ref 11.5–15.5)
WBC: 10.8 10*3/uL — AB (ref 4.0–10.5)
nRBC: 0 % (ref 0.0–0.2)

## 2018-01-28 LAB — COMPREHENSIVE METABOLIC PANEL
ALT: 8 U/L (ref 0–44)
AST: 17 U/L (ref 15–41)
Albumin: 3.2 g/dL — ABNORMAL LOW (ref 3.5–5.0)
Alkaline Phosphatase: 75 U/L (ref 38–126)
Anion gap: 10 (ref 5–15)
BUN: 20 mg/dL (ref 8–23)
CHLORIDE: 97 mmol/L — AB (ref 98–111)
CO2: 26 mmol/L (ref 22–32)
CREATININE: 1.22 mg/dL (ref 0.61–1.24)
Calcium: 9.1 mg/dL (ref 8.9–10.3)
GFR, EST NON AFRICAN AMERICAN: 54 mL/min — AB (ref >60)
Glucose, Bld: 107 mg/dL — ABNORMAL HIGH (ref 70–99)
Potassium: 3.4 mmol/L — ABNORMAL LOW (ref 3.5–5.1)
Sodium: 133 mmol/L — ABNORMAL LOW (ref 135–145)
Total Bilirubin: 0.5 mg/dL (ref 0.3–1.2)
Total Protein: 8.4 g/dL — ABNORMAL HIGH (ref 6.5–8.1)

## 2018-01-28 LAB — CBG MONITORING, ED
GLUCOSE-CAPILLARY: 97 mg/dL (ref 70–99)
GLUCOSE-CAPILLARY: 182 mg/dL — AB (ref 70–99)
Glucose-Capillary: 30 mg/dL — CL (ref 70–99)
Glucose-Capillary: 153 mg/dL — ABNORMAL HIGH (ref 70–99)

## 2018-01-28 LAB — URINALYSIS, ROUTINE W REFLEX MICROSCOPIC
Glucose, UA: NEGATIVE mg/dL
Ketones, ur: 15 mg/dL — AB
Leukocytes, UA: NEGATIVE
Nitrite: NEGATIVE
Protein, ur: >300 mg/dL — AB
pH: 5.5 (ref 5.0–8.0)

## 2018-01-28 LAB — URINALYSIS, MICROSCOPIC (REFLEX)

## 2018-01-28 LAB — LIPASE, BLOOD: LIPASE: 23 U/L (ref 11–51)

## 2018-01-28 MED ORDER — PANCRELIPASE (LIP-PROT-AMYL) 12000-38000 UNITS PO CPEP: 12000.0000 [IU] | ORAL_CAPSULE | Freq: Three times a day (TID) | ORAL | 0 refills | Status: AC

## 2018-01-28 MED ORDER — DEXTROSE 50 % IV SOLN
1.0000 | Freq: Once | INTRAVENOUS | Status: AC
  Administered 2018-01-28: 50 mL via INTRAVENOUS
  Filled 2018-01-28: qty 50

## 2018-01-28 MED ORDER — ATORVASTATIN CALCIUM 40 MG PO TABS: 40.0000 mg | ORAL_TABLET | Freq: Every day | ORAL | 0 refills | Status: DC

## 2018-01-28 MED ORDER — INSULIN ASPART PROT & ASPART (70-30 MIX) 100 UNIT/ML ~~LOC~~ SUSP: 30.0000 [IU] | Freq: Two times a day (BID) | SUBCUTANEOUS | 0 refills | Status: DC

## 2018-01-28 MED ORDER — OMEPRAZOLE 40 MG PO CPDR: 40.0000 mg | DELAYED_RELEASE_CAPSULE | Freq: Every day | ORAL | 0 refills | Status: DC

## 2018-01-28 NOTE — ED Triage Notes (Signed)
Pt states he forgot how to give his Insulin and has been giving it wrong for a week. States he has had shingles for 3 weeks. Rash on his right upper arm and upper back.

## 2018-01-28 NOTE — Discharge Instructions (Signed)
Return to the ED immediately for new or worsening symptoms, such as uncontrolled sugars, vomiting, abdominal pain, fevers, chills, chest pain, shortness of breath or any concerns at all.

## 2018-01-28 NOTE — Care Management Note (Signed)
Case Management Note  Patient Details  Name: Justin [name] MRN: [id number] Date of Birth: [birth year]  Subjective/Objective:                  took too much insulin and rash  Action/Plan: [facility] and CSW covering [place] spoke with the patient's daughter Justin [name] via telephone. Patient's daughter states she is the Medical POA and will need to select the home health agency for the patient. She reports concerns about the patient taking insulin incorrectly. She states the patient and her mother need to live in an Assisted Living. The patient and his wife currently live with one of their sons. See CSW note. Justin [name] selected [facility] for RN, SW and HHA visits. Orders and face-to-face faxed to [facility]. Confirmation received.   Expected Discharge Date:    01/28/18            Expected Discharge Plan:  [place]  In-House Referral:     Discharge planning Services  CM Consult  Post Acute Care Choice:  Home Health Choice offered to:  Adult Children, HC POA / Guardian(Justin [name] - daughter, medical POA)  DME Arranged:  N/A DME Agency:  NA  HH Arranged:  RN, Nurse's Aide, Social Work [place] Agency:  [place]  Status of Service:  Completed, signed off  If discussed at [employer], dates discussed:    Additional Comments:  Justin Schneiders, RN 01/28/2018, 7:52 PM

## 2018-01-28 NOTE — ED Provider Notes (Signed)
Medical screening examination/treatment/procedure(s) were conducted as a shared visit with non-physician practitioner(s) and myself.  I personally evaluated the patient during the encounter.  None   Patient seen by me along with the physician assistant.  Patient brought in by family members.  He does live with another family member who is currently not here.  It does seem to be some confusion on medications being given properly.  Patient has had shingles to the right arm and chest area for about 6 weeks.  Ever it is scabbed over so there is evidence of healing.  It appears that patient is out of several as met medications patient needs renewal of his insulin needs a renewal of his Prilosec for needs a renewal of his Lipitor.  We did put in a consult to social services and case management to make a home visit make sure patient is getting his medications properly.  Patient was ready for discharge home when he was not feeling well blood sugar was rechecked and he was hypoglycemic.  Patient given some D50.  Patient will be given some food and we will monitor to see if he maintains his blood sugars.  He is unable to maintain his blood sugars he will require admission.  CRITICAL CARE Performed by: Fredia Sorrow Total critical care time: 30 minutes Critical care time was exclusive of separately billable procedures and treating other patients. Critical care was necessary to treat or prevent imminent or life-threatening deterioration. Critical care was time spent personally by me on the following activities: development of treatment plan with patient and/or surrogate as well as nursing, discussions with consultants, evaluation of patient's response to treatment, examination of patient, obtaining history from patient or surrogate, ordering and performing treatments and interventions, ordering and review of laboratory studies, ordering and review of radiographic studies, pulse oximetry and re-evaluation of  patient's condition.  Results for orders placed or performed during the hospital encounter of 01/28/18  Comprehensive metabolic panel  Result Value Ref Range   Sodium 133 (L) 135 - 145 mmol/L   Potassium 3.4 (L) 3.5 - 5.1 mmol/L   Chloride 97 (L) 98 - 111 mmol/L   CO2 26 22 - 32 mmol/L   Glucose, Bld 107 (H) 70 - 99 mg/dL   BUN 20 8 - 23 mg/dL   Creatinine, Ser 1.22 0.61 - 1.24 mg/dL   Calcium 9.1 8.9 - 10.3 mg/dL   Total Protein 8.4 (H) 6.5 - 8.1 g/dL   Albumin 3.2 (L) 3.5 - 5.0 g/dL   AST 17 15 - 41 U/L   ALT 8 0 - 44 U/L   Alkaline Phosphatase 75 38 - 126 U/L   Total Bilirubin 0.5 0.3 - 1.2 mg/dL   GFR calc non Af Amer 54 (L) >60 mL/min   GFR calc Af Amer >60 >60 mL/min   Anion gap 10 5 - 15  Lipase, blood  Result Value Ref Range   Lipase 23 11 - 51 U/L  CBC with Differential  Result Value Ref Range   WBC 10.8 (H) 4.0 - 10.5 K/uL   RBC 4.35 4.22 - 5.81 MIL/uL   Hemoglobin 12.5 (L) 13.0 - 17.0 g/dL   HCT 38.2 (L) 39.0 - 52.0 %   MCV 87.8 80.0 - 100.0 fL   MCH 28.7 26.0 - 34.0 pg   MCHC 32.7 30.0 - 36.0 g/dL   RDW 14.1 11.5 - 15.5 %   Platelets 443 (H) 150 - 400 K/uL   nRBC 0.0 0.0 - 0.2 %  Neutrophils Relative % 66 %   Neutro Abs 7.3 1.7 - 7.7 K/uL   Lymphocytes Relative 23 %   Lymphs Abs 2.5 0.7 - 4.0 K/uL   Monocytes Relative 8 %   Monocytes Absolute 0.8 0.1 - 1.0 K/uL   Eosinophils Relative 1 %   Eosinophils Absolute 0.1 0.0 - 0.5 K/uL   Basophils Relative 1 %   Basophils Absolute 0.1 0.0 - 0.1 K/uL   Immature Granulocytes 1 %   Abs Immature Granulocytes 0.07 0.00 - 0.07 K/uL  Urinalysis, Routine w reflex microscopic  Result Value Ref Range   Color, Urine YELLOW YELLOW   APPearance HAZY (A) CLEAR   Specific Gravity, Urine >1.030 (H) 1.005 - 1.030   pH 5.5 5.0 - 8.0   Glucose, UA NEGATIVE NEGATIVE mg/dL   Hgb urine dipstick TRACE (A) NEGATIVE   Bilirubin Urine SMALL (A) NEGATIVE   Ketones, ur 15 (A) NEGATIVE mg/dL   Protein, ur >300 (A) NEGATIVE mg/dL    Nitrite NEGATIVE NEGATIVE   Leukocytes, UA NEGATIVE NEGATIVE  Urinalysis, Microscopic (reflex)  Result Value Ref Range   RBC / HPF 6-10 0 - 5 RBC/hpf   WBC, UA 6-10 0 - 5 WBC/hpf   Bacteria, UA FEW (A) NONE SEEN   Squamous Epithelial / LPF 11-20 0 - 5   Mucus PRESENT    Hyaline Casts, UA PRESENT    Granular Casts, UA PRESENT   POC CBG, ED  Result Value Ref Range   Glucose-Capillary 97 70 - 99 mg/dL  CBG monitoring, ED  Result Value Ref Range   Glucose-Capillary 30 (LL) 70 - 99 mg/dL   Comment 1 D5 0        Fredia Sorrow, MD 01/28/18 2006

## 2018-01-28 NOTE — ED Provider Notes (Signed)
[facility] EMERGENCY DEPARTMENT Provider Note   CSN: [id number] Arrival date & time: 01/28/18  1539     History   Chief Complaint Chief Complaint  Patient presents with  . Rash  . Weight Loss    HPI Justin [name] is a 83 y.o. male.  83 year old male, with a PMH of HTN, HLD, diabetes, dementia, presents due to issues with his blood sugar.  Patient was recently diagnosed with shingles.  He was placed on valacyclovir.  Per daughter at bedside he has not been compliant with his valacyclovir because he cannot remember to take it.  His son is his caregiver and is supposed to be giving it to him but is not reliable.  Patient states his sugars have been running high and he has increased his insulin because of this.  Patient notes however then he had hypoglycemia yesterday with a sugar in the 70s.  Per patient this is why he is here.  Per daughter he is here because he has had difficulty remembering how to take his insulin and has been taking too much.  Patient notes some mild epigastric abdominal pain.  He denies any nausea, vomiting, fevers, chills, cough, rhinorrhea, chest pain, shortness of breath.   Rash   There has been no fever.    Past Medical History:  Diagnosis Date  . Arthritis   . Diabetes mellitus   . HTN (hypertension)   . Hypercholesteremia   . Iron deficiency   . Pancreatitis, acute 06/02/2012   Diagnosed 05/2012   . Personal history of colonic adenomas 04/15/2013   04/15/2013 2 diminutive polyps    . Stroke ([place])   . TIA (transient ischemic attack)     Patient Active Problem List   Diagnosis Date Noted  . Type 2 diabetes mellitus with hypoglycemia without coma, with long-term current use of insulin ([place]) 09/29/2015  . Arthritis 09/06/2015  . Diabetic peripheral neuropathy ([place]) 08/17/2015  . OA (osteoarthritis) of knee 08/17/2015  . Hammer toe, acquired 09/30/2013  . Personal history of colonic adenomas 04/15/2013  . Chronic pancreatitis ([place])  02/26/2013  . Anemia of chronic disease 02/26/2013  . Special screening for malignant neoplasms, colon 02/26/2013  . Essential hypertension, benign 12/14/2012  . Testosterone deficiency 10/22/2011  . Headache above the eye region 02/22/2011  . Type 2 diabetes mellitus with diabetic nephropathy, with long-term current use of insulin ([place]) 02/22/2011  . TIA on medication 02/22/2011  . Coronary artery disease 02/22/2011    Past Surgical History:  Procedure Laterality Date  . ARTERY BIOPSY  02/26/2011   Procedure: BIOPSY TEMPORAL ARTERY;  Surgeon: Willey Blade, MD;  Location: [facility];  Service: General;  Laterality: Left;  Left temporal artery biospy  . BACK SURGERY  1985  . CHOLECYSTECTOMY    . COLONOSCOPY    . EUS    . NECK SURGERY  1985  . TEMPORAL ARTERY BIOPSY / LIGATION  02/26/2011   left side  . UPPER GASTROINTESTINAL ENDOSCOPY          Home Medications    Prior to Admission medications   Medication Sig Start Date End Date Taking? Authorizing Provider  Alcohol Swabs PADS Use to check blood sugar 3 times daily. Dx: E10.9 07/21/15   Ivar Drape D, PA  atorvastatin (LIPITOR) 40 MG tablet TAKE 1 TABLET EVERY DAY 07/21/15   Ivar Drape D, PA  Blood Glucose Calibration (ACCU-CHEK AVIVA) SOLN Use to check blood sugar 3 times daily. Dx: E10.9 07/21/15  Ivar Drape D, PA  Blood Glucose Monitoring Suppl (ACCU-CHEK AVIVA PLUS) w/Device KIT Use to check blood sugar 3 times daily. Dx: E10.9 09/27/15   Wardell Honour, MD  clopidogrel (PLAVIX) 75 MG tablet TAKE 1 TABLET EVERY DAY 02/21/15   Ivar Drape D, PA  fish oil-omega-3 fatty acids 1000 MG capsule Take 2 g by mouth daily.      [provider]  glipiZIDE (GLUCOTROL XL) 2.5 MG 24 hr tablet Take 1 tablet (2.5 mg total) by mouth daily with breakfast. 12/01/15   Philemon Kingdom, MD  glucagon (GLUCAGON EMERGENCY) 1 MG injection Inject 1 mg into the muscle once as needed. 09/29/15    Philemon Kingdom, MD  glucose blood (ACCU-CHEK AVIVA PLUS) test strip Use to check blood sugar 3 times daily. Dx: E10.9 11/17/15   Philemon Kingdom, MD  insulin aspart protamine- aspart (NOVOLOG MIX 70/30) (70-30) 100 UNIT/ML injection Inject 0.3-0.4 mLs (30-40 Units total) into the skin 2 (two) times daily before a meal. PENS, please! 03/09/16   Philemon Kingdom, MD  Insulin Glargine (LANTUS SOLOSTAR) 100 UNIT/ML Solostar Pen Inject 45 Units into the skin daily at 10 pm. 10/03/15   Philemon Kingdom, MD  Insulin Pen Needle 32G X 4 MM MISC Use as directed to inject insulin tidwc 12/14/12   Shawnee Knapp, MD  Ipratropium-Albuterol (COMBIVENT) 20-100 MCG/ACT AERS respimat Inhale 1 puff into the lungs every 6 (six) hours. 09/20/15   Shawnee Knapp, MD  l-methylfolate-B6-B12 [facility]) 3-35-2 MG TABS Take 1 tablet by mouth daily.      [provider]  Lancets (ACCU-CHEK SOFT TOUCH) lancets Use to check blood sugar 3 times daily. Dx: E10.9 07/21/15   Ivar Drape D, PA  lipase/protease/amylase (CREON) 12000 units CPEP capsule Take 1 capsule (12,000 Units total) by mouth 3 (three) times daily with meals. Office visit needed for refills 12/03/16   Harrison Mons, PA  metFORMIN (GLUCOPHAGE) 1000 MG tablet TAKE 1 TABLET TWICE DAILY WITH MEALS (NEED MD APPOINTMENT) 09/22/15   Copland, Gay Filler, MD  Multiple Vitamins-Minerals (MULTIVITAMIN WITH MINERALS) tablet Take 1 tablet by mouth daily.      [provider]  omeprazole (PRILOSEC) 40 MG capsule TAKE ONE CAPSULE BY MOUTH ONCE DAILY 10/01/16   Shawnee Knapp, MD  Spacer/Aero-Holding [facility] Use as directed with combivent. Ok to dispense any spacer that works with the inhaler being rxed to pt. 09/20/15   Shawnee Knapp, MD  traMADol (ULTRAM) 50 MG tablet Take 50 mg by mouth. 11/14/15   [provider]  testosterone cypionate (DEPOTESTOTERONE CYPIONATE) 200 MG/ML injection Inject into the muscle every 14 (fourteen) days.  05/24/11  [provider]    Family History Family History  Problem Relation Age of Onset  . Heart disease Mother   . Amblyopia Father   . Diabetes Sister        x 2  . Heart attack Sister     Social History Social History   Tobacco Use  . Smoking status: Former Smoker    Years: 35.00    Types: Cigarettes    Last attempt to quit: 04/09/1990    Years since quitting: 27.8  . Smokeless tobacco: Never Used  Substance Use Topics  . Alcohol use: No    Comment: "per pt stopped drinking in the 90's"  . Drug use: No     Allergies   Patient has no known allergies.   Review of Systems Review of Systems  Constitutional: Negative  for chills and fever.  HENT: Negative for rhinorrhea and sore throat.   Respiratory: Negative for shortness of breath.   Cardiovascular: Negative for chest pain.  Gastrointestinal: Positive for abdominal pain. Negative for nausea and vomiting.  Genitourinary: Negative for dysuria.  Skin: Positive for rash.  Neurological: Negative for syncope.     Physical Exam Updated Vital Signs BP (!) 154/67 (BP Location: Right Arm)   Pulse 90   Temp 98.5 F (36.9 C) (Oral)   Resp 20   Ht 6' (1.829 m)   Wt 78.9 kg   SpO2 97%   BMI 23.60 kg/m   Physical Exam  Constitutional: He is oriented to person, place, and time. He appears well-developed and well-nourished.  HENT:  Head: Normocephalic and atraumatic.  Eyes: Conjunctivae and EOM are normal.  Neck: Neck supple.  Cardiovascular: Normal rate, regular rhythm and normal heart sounds.  No murmur heard. Pulmonary/Chest: Effort normal and breath sounds normal. No respiratory distress. He has no wheezes. He has no rales.  Abdominal: Soft. Bowel sounds are normal. He exhibits no distension. There is tenderness (epigastric, mild tenderness).  Musculoskeletal: Normal range of motion. He exhibits no tenderness or deformity.  Neurological: He is alert and oriented to person, place, and time.  Skin: Rash (erythema and  scabbed lesions noted to the left upper chest radiating to the left upper back and posterior left arm) noted.  Psychiatric: He has a normal mood and affect. His behavior is normal.  Nursing note and vitals reviewed.    ED Treatments / Results  Labs (all labs ordered are listed, but only abnormal results are displayed) Labs Reviewed  COMPREHENSIVE METABOLIC PANEL - Abnormal; Notable for the following components:      Result Value   Sodium 133 (*)    Potassium 3.4 (*)    Chloride 97 (*)    Glucose, Bld 107 (*)    Total Protein 8.4 (*)    Albumin 3.2 (*)    GFR calc non Af Amer 54 (*)    All other components within normal limits  CBC WITH DIFFERENTIAL/PLATELET - Abnormal; Notable for the following components:   WBC 10.8 (*)    Hemoglobin 12.5 (*)    HCT 38.2 (*)    Platelets 443 (*)    All other components within normal limits  URINALYSIS, ROUTINE W REFLEX MICROSCOPIC - Abnormal; Notable for the following components:   APPearance HAZY (*)    Specific Gravity, Urine >1.030 (*)    Hgb urine dipstick TRACE (*)    Bilirubin Urine SMALL (*)    Ketones, ur 15 (*)    Protein, ur >300 (*)    All other components within normal limits  URINALYSIS, MICROSCOPIC (REFLEX) - Abnormal; Notable for the following components:   Bacteria, UA FEW (*)    All other components within normal limits  LIPASE, BLOOD  CBG MONITORING, ED    EKG None  Radiology No results found.  Procedures Procedures (including critical care time)  Medications Ordered in ED Medications - No data to display   Initial Impression / Assessment and Plan / ED Course  I have reviewed the triage vital signs and the nursing notes.  Pertinent labs & imaging results that were available during my care of the patient were reviewed by me and considered in my medical decision making (see chart for details).     No acute findings on blood work today.  No evidence of DKA.  Patient sugars normal.  Further discussion with  daughter suggests that main concern is patient's dementia inhibiting him from taking his insulin appropriately.  Daughter states patient lives with his son and his son is not helping him with these medications.  Face-to-face order placed in addition to social work and case management consults.  Patient and family that these consults were ordered.  They are in agreement with these consults.  Family also requesting refills on Prilosec, Lipitor, NovoLog and Creon.  Patient seen and evaluated by Dr. Rogene Houston who recommends refilling these prescriptions.    7:43 PM I was informed by the nurse that the patient was not feeling well.  He was feeling tired and sweaty.  Sugar rechecked and his glucose was noted to be 30.  Amp of D50 ordered.  Will give patient food and drink and continue to monitor.   9:09 PM Pt resting comfortably in bed. Blood sugar re-checked and stable. Pt eating and tolerating POs. Discussed with Dr. Rogene Houston who recommends d/c home with strict instructions of no more insulin tonight.  Instructed family to not take any more insulin tonight.  They expressed understanding.  Very well-appearing, no acute distress, nontoxic, non-lethargic.  Vital signs stable.  Patient pending approval for discharge.  At this time there does not appear to be any evidence of an acute emergency medical condition and the patient appears stable for discharge with appropriate outpatient follow up.Diagnosis was discussed with patient who verbalizes understanding and is agreeable to discharge.  Final Clinical Impressions(s) / ED Diagnoses   Final diagnoses:  None    ED Discharge Orders    None       Rachel Moulds 01/28/18 2112    Fredia Sorrow, MD 01/30/18 [id number]

## 2018-01-28 NOTE — ED Notes (Signed)
Pt states he is taking insulin wrong x 1 week  States cbg has been down to 74,  Per daughter hehas been drawing up to much and not eating like he should,,  Also has shingles t rt chest, arm and side  Shingles are drying up
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
## 2018-01-29 ENCOUNTER — Telehealth (HOSPITAL_BASED_OUTPATIENT_CLINIC_OR_DEPARTMENT_OTHER): Payer: Self-pay | Admitting: Emergency Medicine

## 2018-01-29 NOTE — Care Management Note (Signed)
Case Management Note  Patient Details  Name: Justin [name] MRN: [id number] Date of Birth: 11-03-1934  CM received a call from [place] with [facility] who adivsed they could not accept pt for serivices due to no staff availbility in the pt's area.  CM contacted Santiago Glad with [facility] who accepted pt for services and has already informed the [facility] CSW of pt's needs.  No further CM needs noted at this time.  Expected Discharge Date:   01/28/2018               Expected Discharge Plan:  [place]  Discharge planning Services  CM Consult  Post Acute Care Choice:  Home Health Choice offered to:  Adult Children, HC POA / Guardian(Emma - daughter, medical POA)  HH Arranged:  RN, Nurse's Aide, Social Work [facility] Agency:  [facility]   Purl Claytor, Benjaman Lobe, RN 01/29/2018, 2:56 PM

## 2018-01-29 NOTE — Progress Notes (Unsigned)
CSW received call from [facility] that she has been assigned to pt's case. CSW received another call from [place] with [facility] that pt's report was accepted and a referral to the [employer] office would be made.    Virgie Dad Saivon Prowse, MSW, [facility] Clinical Social Worker [phone]

## 2018-02-02 ENCOUNTER — Emergency Department (HOSPITAL_BASED_OUTPATIENT_CLINIC_OR_DEPARTMENT_OTHER)
Admission: EM | Admit: 2018-02-02 | Discharge: 2018-02-02 | Disposition: A | Discharge status: Home / Self Care | Payer: Medicare Other | Attending: Emergency Medicine | Admitting: Emergency Medicine

## 2018-02-02 DIAGNOSIS — Z79899 Other long term (current) drug therapy: Secondary | ICD-10-CM | POA: Insufficient documentation

## 2018-02-02 DIAGNOSIS — Z87891 Personal history of nicotine dependence: Secondary | ICD-10-CM | POA: Insufficient documentation

## 2018-02-02 DIAGNOSIS — Z7902 Long term (current) use of antithrombotics/antiplatelets: Secondary | ICD-10-CM | POA: Insufficient documentation

## 2018-02-02 DIAGNOSIS — I2510 Atherosclerotic heart disease of native coronary artery without angina pectoris: Secondary | ICD-10-CM | POA: Insufficient documentation

## 2018-02-02 DIAGNOSIS — E119 Type 2 diabetes mellitus without complications: Secondary | ICD-10-CM | POA: Diagnosis not present

## 2018-02-02 DIAGNOSIS — Z8673 Personal history of transient ischemic attack (TIA), and cerebral infarction without residual deficits: Secondary | ICD-10-CM | POA: Insufficient documentation

## 2018-02-02 DIAGNOSIS — M792 Neuralgia and neuritis, unspecified: Secondary | ICD-10-CM | POA: Insufficient documentation

## 2018-02-02 DIAGNOSIS — Z794 Long term (current) use of insulin: Secondary | ICD-10-CM | POA: Insufficient documentation

## 2018-02-02 DIAGNOSIS — B029 Zoster without complications: Secondary | ICD-10-CM | POA: Insufficient documentation

## 2018-02-02 DIAGNOSIS — I10 Essential (primary) hypertension: Secondary | ICD-10-CM | POA: Diagnosis not present

## 2018-02-02 MED ORDER — MUPIROCIN CALCIUM 2 % EX CREA: 1.0000 "application " | TOPICAL_CREAM | Freq: Three times a day (TID) | CUTANEOUS | 0 refills | Status: AC

## 2018-02-02 MED ORDER — LIDOCAINE 5 % EX OINT: 1.0000 "application " | TOPICAL_OINTMENT | Freq: Four times a day (QID) | CUTANEOUS | 1 refills | Status: DC | PRN

## 2018-02-02 MED ORDER — LIDOCAINE HCL URETHRAL/MUCOSAL 2 % EX GEL
1.0000 "application " | Freq: Once | CUTANEOUS | Status: AC
  Administered 2018-02-02: 1 via TOPICAL
  Filled 2018-02-02: qty 20

## 2018-02-02 NOTE — ED Notes (Signed)
Pt understood dc material. NAD noted. Scripts given at dc 

## 2018-02-02 NOTE — ED Provider Notes (Signed)
[place] EMERGENCY DEPARTMENT Provider Note  CSN: [id number] Arrival date & time: 02/02/18 0157  Chief Complaint(s) Rash  HPI Justin [name] is a 83 y.o. male with extensive past medical history listed below including hypertension, diabetes, right chest wall shingles which is been ongoing for 3 weeks.  He presents today for complication of the shingles, stating that he has had burning for several days.  Patient has been placed on steroid cream and valacyclovir.  States that he is taking over-the-counter Aleve with minimal relief.  Denies taking any other pain medicine.  Denies any associated fevers.  They do endorse serous drainage from parts of the rash. No surrounding redness.   HPI  Past Medical History Past Medical History:  Diagnosis Date  . Arthritis   . Diabetes mellitus   . HTN (hypertension)   . Hypercholesteremia   . Iron deficiency   . Pancreatitis, acute 06/02/2012   Diagnosed 05/2012   . Personal history of colonic adenomas 04/15/2013   04/15/2013 2 diminutive polyps    . Stroke ([place])   . TIA (transient ischemic attack)    Patient Active Problem List   Diagnosis Date Noted  . Type 2 diabetes mellitus with hypoglycemia without coma, with long-term current use of insulin ([place]) 09/29/2015  . Arthritis 09/06/2015  . Diabetic peripheral neuropathy ([place]) 08/17/2015  . OA (osteoarthritis) of knee 08/17/2015  . Hammer toe, acquired 09/30/2013  . Personal history of colonic adenomas 04/15/2013  . Chronic pancreatitis ([place]) 02/26/2013  . Anemia of chronic disease 02/26/2013  . Special screening for malignant neoplasms, colon 02/26/2013  . Essential hypertension, benign 12/14/2012  . Testosterone deficiency 10/22/2011  . Headache above the eye region 02/22/2011  . Type 2 diabetes mellitus with diabetic nephropathy, with long-term current use of insulin ([place]) 02/22/2011  . TIA on medication 02/22/2011  . Coronary artery disease 02/22/2011   Home  Medication(s) Prior to Admission medications   Medication Sig Start Date End Date Taking? Authorizing Provider  Alcohol Swabs PADS Use to check blood sugar 3 times daily. Dx: E10.9 07/21/15   Ivar Drape D, PA  atorvastatin (LIPITOR) 40 MG tablet Take 1 tablet (40 mg total) by mouth daily. 01/28/18 02/27/18  Kendrick, Caitlyn S, PA-C  Blood Glucose Calibration (ACCU-CHEK AVIVA) SOLN Use to check blood sugar 3 times daily. Dx: E10.9 07/21/15   Ivar Drape D, PA  Blood Glucose Monitoring Suppl (ACCU-CHEK AVIVA PLUS) w/Device KIT Use to check blood sugar 3 times daily. Dx: E10.9 09/27/15   Wardell Honour, MD  clopidogrel (PLAVIX) 75 MG tablet TAKE 1 TABLET EVERY DAY 02/21/15   Ivar Drape D, PA  fish oil-omega-3 fatty acids 1000 MG capsule Take 2 g by mouth daily.      [provider]  glipiZIDE (GLUCOTROL XL) 2.5 MG 24 hr tablet Take 1 tablet (2.5 mg total) by mouth daily with breakfast. 12/01/15   Philemon Kingdom, MD  glucagon (GLUCAGON EMERGENCY) 1 MG injection Inject 1 mg into the muscle once as needed. 09/29/15   Philemon Kingdom, MD  glucose blood (ACCU-CHEK AVIVA PLUS) test strip Use to check blood sugar 3 times daily. Dx: E10.9 11/17/15   Philemon Kingdom, MD  insulin aspart protamine- aspart (NOVOLOG MIX 70/30) (70-30) 100 UNIT/ML injection Inject 0.3-0.4 mLs (30-40 Units total) into the skin 2 (two) times daily before a meal. PENS, please! 01/28/18 02/27/18  Kendrick, Caitlyn S, PA-C  Insulin Glargine (LANTUS SOLOSTAR) 100 UNIT/ML Solostar Pen Inject 45 Units into the skin daily  at 10 pm. 10/03/15   Philemon Kingdom, MD  Insulin Pen Needle 32G X 4 MM MISC Use as directed to inject insulin tidwc 12/14/12   Shawnee Knapp, MD  Ipratropium-Albuterol (COMBIVENT) 20-100 MCG/ACT AERS respimat Inhale 1 puff into the lungs every 6 (six) hours. 09/20/15   Shawnee Knapp, MD  l-methylfolate-B6-B12 [facility]) 3-35-2 MG TABS Take 1 tablet by mouth daily.      [provider]   Lancets (ACCU-CHEK SOFT TOUCH) lancets Use to check blood sugar 3 times daily. Dx: E10.9 07/21/15   Ivar Drape D, PA  lidocaine (XYLOCAINE) 5 % ointment Apply 1 application topically 4 (four) times daily as needed. Apply enough to spread across rash, but less than 6 inches of ointment. 02/02/18   Fatima Blank, MD  lipase/protease/amylase (CREON) 12000 units CPEP capsule Take 1 capsule (12,000 Units total) by mouth 3 (three) times daily with meals. Office visit needed for refills 01/28/18 02/27/18  Kendrick, Caitlyn S, PA-C  metFORMIN (GLUCOPHAGE) 1000 MG tablet TAKE 1 TABLET TWICE DAILY WITH MEALS (NEED MD APPOINTMENT) 09/22/15   Copland, Gay Filler, MD  Multiple Vitamins-Minerals (MULTIVITAMIN WITH MINERALS) tablet Take 1 tablet by mouth daily.      [provider]  mupirocin cream (BACTROBAN) 2 % Apply 1 application topically 3 (three) times daily for 10 days. 02/02/18 02/12/18  Fatima Blank, MD  omeprazole (PRILOSEC) 40 MG capsule Take 1 capsule (40 mg total) by mouth daily. 01/28/18 02/27/18  Etter Sjogren, PA-C  Spacer/Aero-Holding Chambers DEVI Use as directed with combivent. Ok to dispense any spacer that works with the inhaler being rxed to pt. 09/20/15   Shawnee Knapp, MD  traMADol (ULTRAM) 50 MG tablet Take 50 mg by mouth. 11/14/15   [provider]  testosterone cypionate (DEPOTESTOTERONE CYPIONATE) 200 MG/ML injection Inject into the muscle every 14 (fourteen) days.  05/24/11  [provider]                                                                                                                                    Past Surgical History Past Surgical History:  Procedure Laterality Date  . ARTERY BIOPSY  02/26/2011   Procedure: BIOPSY TEMPORAL ARTERY;  Surgeon: Willey Blade, MD;  Location: [facility];  Service: General;  Laterality: Left;  Left temporal artery biospy  . BACK SURGERY  1985  . CHOLECYSTECTOMY     . COLONOSCOPY    . EUS    . NECK SURGERY  1985  . TEMPORAL ARTERY BIOPSY / LIGATION  02/26/2011   left side  . UPPER GASTROINTESTINAL ENDOSCOPY     Family History Family History  Problem Relation Age of Onset  . Heart disease Mother   . Amblyopia Father   . Diabetes Sister        x 2  . Heart attack Sister     Social History Social  History   Tobacco Use  . Smoking status: Former Smoker    Years: 35.00    Types: Cigarettes    Last attempt to quit: 04/09/1990    Years since quitting: 27.8  . Smokeless tobacco: Never Used  Substance Use Topics  . Alcohol use: No    Comment: "per pt stopped drinking in the 90's"  . Drug use: No   Allergies Patient has no known allergies.  Review of Systems Review of Systems As noted in HPI Physical Exam Vital Signs  I have reviewed the triage vital signs BP (!) 174/92   Pulse 72   Temp 98.5 F (36.9 C) (Oral)   Resp 20   Ht 6' (1.829 m)   Wt 78.9 kg   SpO2 100%   BMI 23.59 kg/m   Physical Exam  Constitutional: He is oriented to person, place, and time. He appears well-developed and well-nourished. No distress.  HENT:  Head: Normocephalic and atraumatic.  Right Ear: External ear normal.  Left Ear: External ear normal.  Nose: Nose normal.  Mouth/Throat: Mucous membranes are normal. No trismus in the jaw.  Eyes: Conjunctivae and EOM are normal. No scleral icterus.  Neck: Normal range of motion and phonation normal.  Cardiovascular: Normal rate and regular rhythm.  Pulmonary/Chest: Effort normal. No stridor. No respiratory distress.  Abdominal: He exhibits no distension.  Musculoskeletal: Normal range of motion. He exhibits no edema.  Neurological: He is alert and oriented to person, place, and time.  Skin: He is not diaphoretic.  Scaly/ulcerative rash to right thorax from back to chest, in dermatomal distribution. No surrounding erythema. Noted serous crusting.   Psychiatric: He has a normal mood and affect. His behavior  is normal.  Vitals reviewed.   ED Results and Treatments Labs (all labs ordered are listed, but only abnormal results are displayed) Labs Reviewed - No data to display                                                                                                                       EKG  EKG Interpretation  Date/Time:    Ventricular Rate:    PR Interval:    QRS Duration:   QT Interval:    QTC Calculation:   R Axis:     Text Interpretation:        Radiology No results found. Pertinent labs & imaging results that were available during my care of the patient were reviewed by me and considered in my medical decision making (see chart for details).  Medications Ordered in ED Medications  lidocaine (XYLOCAINE) 2 % jelly 1 application (has no administration in time range)  Procedures Procedures  (including critical care time)  Medical Decision Making / ED Course I have reviewed the nursing notes for this encounter and the patient's prior records (if available in EHR or on provided paperwork).      Patient presents with burning pain related to shingles rash.  The patient currently only on steroid cream and antiviral.  Taking over-the-counter NSAIDs with minimal relief.  Rash does not appear to have evidence of superimposed infection, but given DM and crusting, will treat for possible impetigo with bactroban.  For pain, recommend topical lidocaine.  The patient appears reasonably screened and/or stabilized for discharge and I doubt any other medical condition or other [facility] requiring further screening, evaluation, or treatment in the ED at this time prior to discharge.  The patient is safe for discharge with strict return precautions.    .Final Clinical Impression(s) / ED Diagnoses Final diagnoses:  Herpes zoster without complication  Neuralgia    Disposition: Discharge  Condition: Good  I have discussed the results, Dx and Tx plan with the patient and family who expressed understanding and agree(s) with the plan. Discharge instructions discussed at great length. The patient and family were given strict return precautions who verbalized understanding of the instructions. No further questions at time of discharge.    ED Discharge Orders         Ordered    lidocaine (XYLOCAINE) 5 % ointment  4 times daily PRN     02/02/18 0347    mupirocin cream (BACTROBAN) 2 %  3 times daily     02/02/18 0347           Follow Up: [facility], [place] [phone]  Schedule an appointment as soon as possible for a visit  in 5-7 days, If symptoms do not improve or  worsen      This chart was dictated using voice recognition software.  Despite best efforts to proofread,  errors can occur which can change the documentation meaning.   Fatima Blank, MD 02/02/18 [phone]

## 2018-02-02 NOTE — ED Triage Notes (Signed)
Pts family states pt was dx with shingles, has been using the blue pills and a steroid cream for the sores. Pt is c/o of burning pain on the right upper back, armpit, chest, and arm

## 2018-08-31 ENCOUNTER — Other Ambulatory Visit: Payer: Self-pay

## 2018-08-31 ENCOUNTER — Encounter (HOSPITAL_COMMUNITY): Payer: Self-pay | Admitting: Emergency Medicine

## 2018-08-31 ENCOUNTER — Emergency Department (HOSPITAL_COMMUNITY)
Admission: EM | Admit: 2018-08-31 | Discharge: 2018-08-31 | Disposition: A | Discharge status: Home / Self Care | Payer: Medicare Other | Attending: Emergency Medicine | Admitting: Emergency Medicine

## 2018-08-31 ENCOUNTER — Emergency Department (HOSPITAL_COMMUNITY): Payer: Medicare Other

## 2018-08-31 DIAGNOSIS — Z794 Long term (current) use of insulin: Secondary | ICD-10-CM | POA: Diagnosis not present

## 2018-08-31 DIAGNOSIS — R531 Weakness: Secondary | ICD-10-CM | POA: Diagnosis present

## 2018-08-31 DIAGNOSIS — Z87891 Personal history of nicotine dependence: Secondary | ICD-10-CM | POA: Insufficient documentation

## 2018-08-31 DIAGNOSIS — I10 Essential (primary) hypertension: Secondary | ICD-10-CM | POA: Diagnosis not present

## 2018-08-31 DIAGNOSIS — I2510 Atherosclerotic heart disease of native coronary artery without angina pectoris: Secondary | ICD-10-CM | POA: Diagnosis not present

## 2018-08-31 DIAGNOSIS — U071 COVID-19: Secondary | ICD-10-CM | POA: Insufficient documentation

## 2018-08-31 DIAGNOSIS — Z79899 Other long term (current) drug therapy: Secondary | ICD-10-CM | POA: Diagnosis not present

## 2018-08-31 DIAGNOSIS — Z7902 Long term (current) use of antithrombotics/antiplatelets: Secondary | ICD-10-CM | POA: Insufficient documentation

## 2018-08-31 DIAGNOSIS — E119 Type 2 diabetes mellitus without complications: Secondary | ICD-10-CM | POA: Insufficient documentation

## 2018-08-31 LAB — COMPREHENSIVE METABOLIC PANEL
ALT: 24 U/L (ref 0–44)
AST: 48 U/L — ABNORMAL HIGH (ref 15–41)
Albumin: 2.8 g/dL — ABNORMAL LOW (ref 3.5–5.0)
Alkaline Phosphatase: 61 U/L (ref 38–126)
Anion gap: 11 (ref 5–15)
BUN: 24 mg/dL — ABNORMAL HIGH (ref 8–23)
CO2: 22 mmol/L (ref 22–32)
Calcium: 7.9 mg/dL — ABNORMAL LOW (ref 8.9–10.3)
Chloride: 96 mmol/L — ABNORMAL LOW (ref 98–111)
Creatinine, Ser: 1.36 mg/dL — ABNORMAL HIGH (ref 0.61–1.24)
GFR calc Af Amer: 56 mL/min — ABNORMAL LOW (ref >60)
GFR calc non Af Amer: 48 mL/min — ABNORMAL LOW (ref >60)
Glucose, Bld: 138 mg/dL — ABNORMAL HIGH (ref 70–99)
Potassium: 3.9 mmol/L (ref 3.5–5.1)
Sodium: 129 mmol/L — ABNORMAL LOW (ref 135–145)
Total Bilirubin: 0.5 mg/dL (ref 0.3–1.2)
Total Protein: 7.3 g/dL (ref 6.5–8.1)

## 2018-08-31 LAB — CBC WITH DIFFERENTIAL/PLATELET
Abs Immature Granulocytes: 0.02 10*3/uL (ref 0.00–0.07)
Basophils Absolute: 0 10*3/uL (ref 0.0–0.1)
Basophils Relative: 1 %
Eosinophils Absolute: 0 10*3/uL (ref 0.0–0.5)
Eosinophils Relative: 1 %
HCT: 32.3 % — ABNORMAL LOW (ref 39.0–52.0)
Hemoglobin: 10.6 g/dL — ABNORMAL LOW (ref 13.0–17.0)
Immature Granulocytes: 1 %
Lymphocytes Relative: 27 %
Lymphs Abs: 1.2 10*3/uL (ref 0.7–4.0)
MCH: 29.4 pg (ref 26.0–34.0)
MCHC: 32.8 g/dL (ref 30.0–36.0)
MCV: 89.5 fL (ref 80.0–100.0)
Monocytes Absolute: 0.6 10*3/uL (ref 0.1–1.0)
Monocytes Relative: 14 %
Neutro Abs: 2.5 10*3/uL (ref 1.7–7.7)
Neutrophils Relative %: 56 %
Platelets: 218 10*3/uL (ref 150–400)
RBC: 3.61 MIL/uL — ABNORMAL LOW (ref 4.22–5.81)
RDW: 12.2 % (ref 11.5–15.5)
WBC: 4.4 10*3/uL (ref 4.0–10.5)
nRBC: 0 % (ref 0.0–0.2)

## 2018-08-31 LAB — URINALYSIS, ROUTINE W REFLEX MICROSCOPIC
Bilirubin Urine: NEGATIVE
Glucose, UA: NEGATIVE mg/dL
Ketones, ur: NEGATIVE mg/dL
Leukocytes,Ua: NEGATIVE
Nitrite: NEGATIVE
Protein, ur: 100 mg/dL — AB
Specific Gravity, Urine: 1.017 (ref 1.005–1.030)
pH: 5 (ref 5.0–8.0)

## 2018-08-31 LAB — TROPONIN I: Troponin I: <0.03 ng/mL (ref <0.03)

## 2018-08-31 LAB — LACTIC ACID, PLASMA: Lactic Acid, Venous: 1 mmol/L (ref 0.5–1.9)

## 2018-08-31 LAB — SARS CORONAVIRUS 2 BY RT PCR (HOSPITAL ORDER, PERFORMED IN ~~LOC~~ HOSPITAL LAB): SARS Coronavirus 2: POSITIVE — AB

## 2018-08-31 LAB — CBG MONITORING, ED: Glucose-Capillary: 148 mg/dL — ABNORMAL HIGH (ref 70–99)

## 2018-08-31 IMAGING — DX PORTABLE CHEST - 1 VIEW
1 series · 1 of 1 positions shown · non-contrast
Comparison: None.

CLINICAL DATA: Weakness

EXAM:
PORTABLE CHEST 1 VIEW

[chest ap]
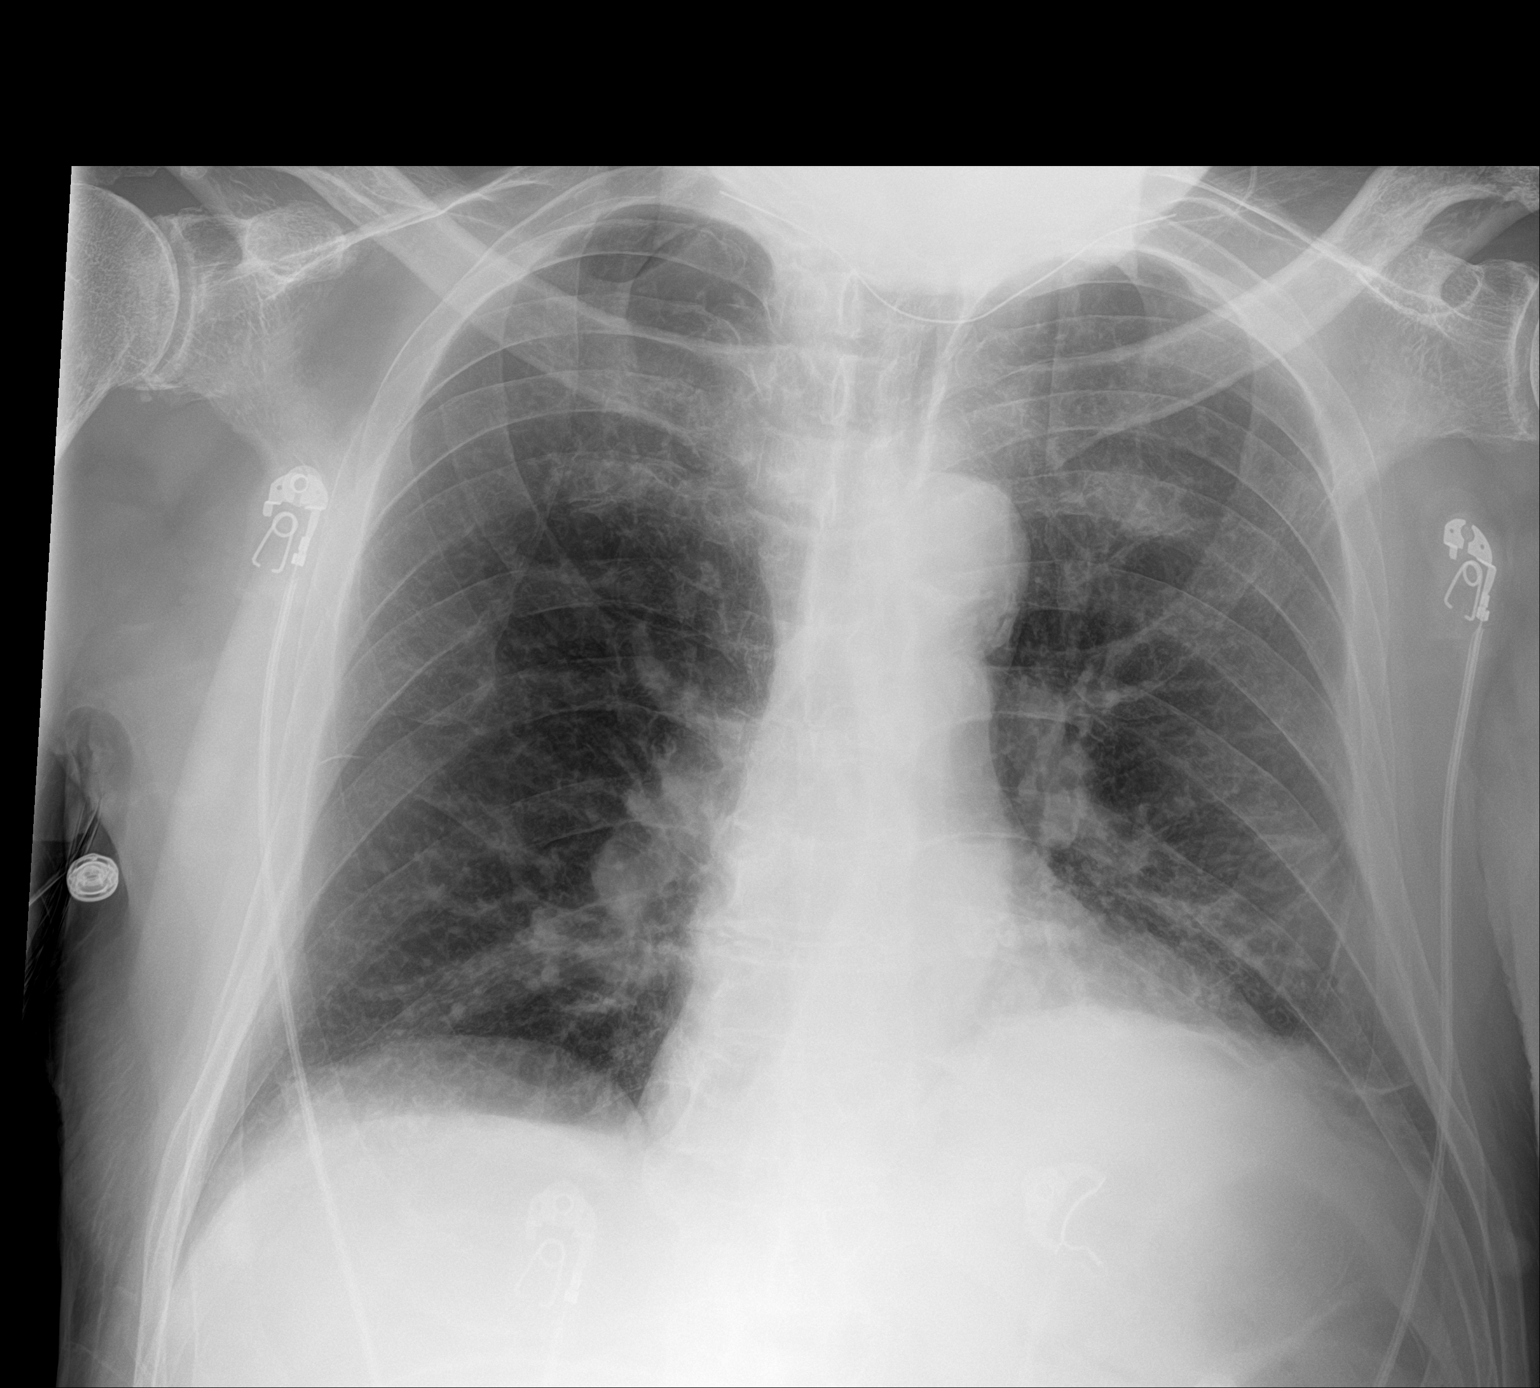

[1 of 1 positions shown; findings below may reference images not displayed]

FINDINGS: There is mild left basilar atelectasis. There is no focal
consolidation. There is no pleural effusion or pneumothorax. The
heart and mediastinal contours are unremarkable.

The osseous structures are unremarkable.
IMPRESSION: No active disease.

## 2018-08-31 MED ORDER — ACETAMINOPHEN 325 MG PO TABS
650.0000 mg | ORAL_TABLET | Freq: Once | ORAL | Status: AC
  Administered 2018-08-31: 650 mg via ORAL
  Filled 2018-08-31: qty 2

## 2018-08-31 MED ORDER — SODIUM CHLORIDE 0.9 % IV BOLUS
1000.0000 mL | Freq: Once | INTRAVENOUS | Status: AC
  Administered 2018-08-31: 22:00:00 1000 mL via INTRAVENOUS

## 2018-08-31 NOTE — ED Provider Notes (Signed)
[facility] EMERGENCY DEPARTMENT Provider Note   CSN: [id number] Arrival date & time: 08/31/18  1837    History   Chief Complaint Chief Complaint  Patient presents with  . Weakness    HPI Justin [name] is a 84 y.o. male with a PMH of Diabetes, HTN, CVA, TIA, HLD, and Dementia presenting with generalized weakness onset 1 week ago. Patient arrived via EMS from [facility]. EMS was called by son who was concerned that his father appears weaker than normal. [facility] has had multiple COVID-19 cases. Patient states he is wheel chair bound. Patient denies fever, cough, shortness of breath, chest pain, abdominal pain, nausea, vomiting, or diarrhea. Patient is a poor historian due to dementia.   Level 5 caveat.      HPI  Past Medical History:  Diagnosis Date  . Arthritis   . Diabetes mellitus   . HTN (hypertension)   . Hypercholesteremia   . Iron deficiency   . Pancreatitis, acute 06/02/2012   Diagnosed 05/2012   . Personal history of colonic adenomas 04/15/2013   04/15/2013 2 diminutive polyps    . Stroke ([place])   . TIA (transient ischemic attack)     Patient Active Problem List   Diagnosis Date Noted  . Type 2 diabetes mellitus with hypoglycemia without coma, with long-term current use of insulin ([place]) 09/29/2015  . Arthritis 09/06/2015  . Diabetic peripheral neuropathy ([place]) 08/17/2015  . OA (osteoarthritis) of knee 08/17/2015  . Hammer toe, acquired 09/30/2013  . Personal history of colonic adenomas 04/15/2013  . Chronic pancreatitis ([place]) 02/26/2013  . Anemia of chronic disease 02/26/2013  . Special screening for malignant neoplasms, colon 02/26/2013  . Essential hypertension, benign 12/14/2012  . Testosterone deficiency 10/22/2011  . Headache above the eye region 02/22/2011  . Type 2 diabetes mellitus with diabetic nephropathy, with long-term current use of insulin ([place]) 02/22/2011  . TIA on medication 02/22/2011  . Coronary artery disease  02/22/2011    Past Surgical History:  Procedure Laterality Date  . ARTERY BIOPSY  02/26/2011   Procedure: BIOPSY TEMPORAL ARTERY;  Surgeon: Willey Blade, MD;  Location: [facility];  Service: General;  Laterality: Left;  Left temporal artery biospy  . BACK SURGERY  1985  . CHOLECYSTECTOMY    . COLONOSCOPY    . EUS    . NECK SURGERY  1985  . TEMPORAL ARTERY BIOPSY / LIGATION  02/26/2011   left side  . UPPER GASTROINTESTINAL ENDOSCOPY          Home Medications    Prior to Admission medications   Medication Sig Start Date End Date Taking? Authorizing Provider  Alcohol Swabs PADS Use to check blood sugar 3 times daily. Dx: E10.9 07/21/15   Ivar Drape D, PA  atorvastatin (LIPITOR) 40 MG tablet Take 1 tablet (40 mg total) by mouth daily. 01/28/18 02/27/18  Kendrick, Caitlyn S, PA-C  Blood Glucose Calibration (ACCU-CHEK AVIVA) SOLN Use to check blood sugar 3 times daily. Dx: E10.9 07/21/15   Ivar Drape D, PA  Blood Glucose Monitoring Suppl (ACCU-CHEK AVIVA PLUS) w/Device KIT Use to check blood sugar 3 times daily. Dx: E10.9 09/27/15   Wardell Honour, MD  clopidogrel (PLAVIX) 75 MG tablet TAKE 1 TABLET EVERY DAY 02/21/15   Ivar Drape D, PA  fish oil-omega-3 fatty acids 1000 MG capsule Take 2 g by mouth daily.      [provider]  glipiZIDE (GLUCOTROL XL) 2.5 MG 24 hr tablet  Take 1 tablet (2.5 mg total) by mouth daily with breakfast. 12/01/15   Philemon Kingdom, MD  glucagon (GLUCAGON EMERGENCY) 1 MG injection Inject 1 mg into the muscle once as needed. 09/29/15   Philemon Kingdom, MD  glucose blood (ACCU-CHEK AVIVA PLUS) test strip Use to check blood sugar 3 times daily. Dx: E10.9 11/17/15   Philemon Kingdom, MD  insulin aspart protamine- aspart (NOVOLOG MIX 70/30) (70-30) 100 UNIT/ML injection Inject 0.3-0.4 mLs (30-40 Units total) into the skin 2 (two) times daily before a meal. PENS, please! 01/28/18 02/27/18  Kendrick, Caitlyn S, PA-C   Insulin Glargine (LANTUS SOLOSTAR) 100 UNIT/ML Solostar Pen Inject 45 Units into the skin daily at 10 pm. 10/03/15   Philemon Kingdom, MD  Insulin Pen Needle 32G X 4 MM MISC Use as directed to inject insulin tidwc 12/14/12   Shawnee Knapp, MD  Ipratropium-Albuterol (COMBIVENT) 20-100 MCG/ACT AERS respimat Inhale 1 puff into the lungs every 6 (six) hours. 09/20/15   Shawnee Knapp, MD  l-methylfolate-B6-B12 [facility]) 3-35-2 MG TABS Take 1 tablet by mouth daily.      [provider]  Lancets (ACCU-CHEK SOFT TOUCH) lancets Use to check blood sugar 3 times daily. Dx: E10.9 07/21/15   Ivar Drape D, PA  lidocaine (XYLOCAINE) 5 % ointment Apply 1 application topically 4 (four) times daily as needed. Apply enough to spread across rash, but less than 6 inches of ointment. 02/02/18   Fatima Blank, MD  metFORMIN (GLUCOPHAGE) 1000 MG tablet TAKE 1 TABLET TWICE DAILY WITH MEALS (NEED MD APPOINTMENT) 09/22/15   Copland, Gay Filler, MD  Multiple Vitamins-Minerals (MULTIVITAMIN WITH MINERALS) tablet Take 1 tablet by mouth daily.      [provider]  omeprazole (PRILOSEC) 40 MG capsule Take 1 capsule (40 mg total) by mouth daily. 01/28/18 02/27/18  Etter Sjogren, PA-C  Spacer/Aero-Holding Chambers DEVI Use as directed with combivent. Ok to dispense any spacer that works with the inhaler being rxed to pt. 09/20/15   Shawnee Knapp, MD  traMADol (ULTRAM) 50 MG tablet Take 50 mg by mouth. 11/14/15   [provider]  testosterone cypionate (DEPOTESTOTERONE CYPIONATE) 200 MG/ML injection Inject into the muscle every 14 (fourteen) days.  05/24/11  [provider]    Family History Family History  Problem Relation Age of Onset  . Heart disease Mother   . Amblyopia Father   . Diabetes Sister        x 2  . Heart attack Sister     Social History Social History   Tobacco Use  . Smoking status: Former Smoker    Years: 35.00    Types: Cigarettes    Last attempt to quit:  04/09/1990    Years since quitting: 28.4  . Smokeless tobacco: Never Used  Substance Use Topics  . Alcohol use: No    Comment: "per pt stopped drinking in the 90's"  . Drug use: No     Allergies   Patient has no known allergies.   Review of Systems Review of Systems  Unable to perform ROS: Dementia     Physical Exam Updated Vital Signs BP (!) 152/75   Pulse 87   Temp 100.3 F (37.9 C) (Rectal)   Resp 18   SpO2 96%   Physical Exam Vitals signs and nursing note reviewed.  Constitutional:      General: He is not in acute distress.    Appearance: He is well-developed. He is not diaphoretic.  HENT:  Head: Normocephalic and atraumatic.     Mouth/Throat:     Mouth: Mucous membranes are moist.     Pharynx: No posterior oropharyngeal erythema.  Eyes:     Extraocular Movements: Extraocular movements intact.     Conjunctiva/sclera: Conjunctivae normal.     Pupils: Pupils are equal, round, and reactive to light.  Neck:     Musculoskeletal: Normal range of motion.  Cardiovascular:     Rate and Rhythm: Normal rate and regular rhythm.     Heart sounds: Normal heart sounds. No murmur. No friction rub. No gallop.   Pulmonary:     Effort: Pulmonary effort is normal. No respiratory distress.     Breath sounds: Normal breath sounds. No wheezing or rales.  Abdominal:     Palpations: Abdomen is soft.     Tenderness: There is no abdominal tenderness.  Musculoskeletal: Normal range of motion.  Skin:    General: Skin is warm.     Findings: No erythema, rash or wound.  Neurological:     Mental Status: He is alert.    Mental Status:  Alert, oriented to person and place. Not able to give a coherent history. Speech fluent without evidence of aphasia. Able to follow 2 step commands without difficulty.  Cranial Nerves:  II:  Peripheral visual fields grossly normal, pupils equal, round, reactive to light III,IV, VI: ptosis not present, extra-ocular motions intact bilaterally   V,VII: smile symmetric, facial light touch sensation equal VIII: hearing grossly normal to voice  IX,X: symmetric elevation of soft palate, uvula elevates symmetrically  XI: bilateral shoulder shrug symmetric and strong XII: midline tongue extension without fassiculations Motor:  Normal tone. 5/5 in upper and lower extremities bilaterally including strong and equal grip strength and dorsiflexion/plantar flexion Sensory: Pinprick and light touch normal in all extremities.  Deep Tendon Reflexes: 2+ and symmetric in the biceps and patella Cerebellar: normal finger-to-nose with bilateral upper extremities Gait: patient is not able to ambulate at baseline CV: distal pulses palpable throughout   ED Treatments / Results  Labs (all labs ordered are listed, but only abnormal results are displayed) Labs Reviewed  SARS CORONAVIRUS 2 (HOSPITAL ORDER, [facility]) - Abnormal; Notable for the following components:      Result Value   SARS Coronavirus 2 POSITIVE (*)    All other components within normal limits  COMPREHENSIVE METABOLIC PANEL - Abnormal; Notable for the following components:   Sodium 129 (*)    Chloride 96 (*)    Glucose, Bld 138 (*)    BUN 24 (*)    Creatinine, Ser 1.36 (*)    Calcium 7.9 (*)    Albumin 2.8 (*)    AST 48 (*)    GFR calc non Af Amer 48 (*)    GFR calc Af Amer 56 (*)    All other components within normal limits  CBC WITH DIFFERENTIAL/PLATELET - Abnormal; Notable for the following components:   RBC 3.61 (*)    Hemoglobin 10.6 (*)    HCT 32.3 (*)    All other components within normal limits  URINALYSIS, ROUTINE W REFLEX MICROSCOPIC - Abnormal; Notable for the following components:   APPearance HAZY (*)    Hgb urine dipstick MODERATE (*)    Protein, ur 100 (*)    Bacteria, UA RARE (*)    All other components within normal limits  CBG MONITORING, ED - Abnormal; Notable for the following components:   Glucose-Capillary 148 (*)  All  other components within normal limits  CULTURE, BLOOD (ROUTINE X 2)  CULTURE, BLOOD (ROUTINE X 2)  URINE CULTURE  LACTIC ACID, PLASMA  TROPONIN I  LACTIC ACID, PLASMA    EKG EKG Interpretation  Date/Time:  Sunday Aug 31 2018 18:44:10 EDT Ventricular Rate:  92 PR Interval:    QRS Duration: 89 QT Interval:  371 QTC Calculation: 459 R Axis:   31 Text Interpretation:  Sinus rhythm No significant change since last tracing Confirmed by Deno Etienne [phone]) on 08/31/2018 8:33:54 PM   Radiology Dg Chest Port 1 View  Result Date: 08/31/2018 CLINICAL DATA:  Weakness EXAM: PORTABLE CHEST 1 VIEW COMPARISON:  None. FINDINGS: There is mild left basilar atelectasis. There is no focal consolidation. There is no pleural effusion or pneumothorax. The heart and mediastinal contours are unremarkable. The osseous structures are unremarkable. IMPRESSION: No active disease. Electronically Signed   By: Kathreen Devoid   On: 08/31/2018 20:02    Procedures Procedures (including critical care time)  Medications Ordered in ED Medications  acetaminophen (TYLENOL) tablet 650 mg (has no administration in time range)  sodium chloride 0.9 % bolus 1,000 mL (1,000 mLs Intravenous New Bag/Given 08/31/18 2223)     Initial Impression / Assessment and Plan / ED Course  I have reviewed the triage vital signs and the nursing notes.  Pertinent labs & imaging results that were available during my care of the patient were reviewed by me and considered in my medical decision making (see chart for details).  Clinical Course as of Aug 30 2309  Sun Aug 31, 2018  2008 No active disease noted on CXR.  DG Chest Port 1 View [AH]  2101 WBCs are within normal limits.  WBC: 4.4 [AH]  2103 Attempted to call daughter, [name], and she did not answer.    [AH]  2136 Called daughter again. Daughter states patient has been using the wheelchair more over the last 2 months and states patient has stopped physical therapy.   [AH]  2137  CMP reveals hyponatremia at 129, low chloride at 96, and elevated creatinine at 1.36. Will provide IVF.  Comprehensive metabolic panel(!) [AH]  5284 Discussed findings with daughter, [name]. Informed daughter of COVID-19 positive results. Patient lives at [facility]. Charge nurse notified [facility] and they state they have a separate area specifically for COVID-19 positive residents. Patient is stable in no acute distress. Oxygen saturations have been stable and CXR is negative. Do not suspect patient requires admission at this time.    [AH]    Clinical Course User Index [AH] Arville Lime, PA-C      Patient presents with generalized weakness. Labs, vitals, and imaging reviewed. Patient is stable in no acute distress. Provided IVF due to mild electrolyte abnormalities. EKG without acute changes, troponin negative, UA does not reveal signs of UTI, WBCs are within normal limits, CXR is negative. Patient is positive for COVID-19. Do not suspect patient needs admission at this time. Patient is stable in no acute distress with normal oxygen saturations. [facility] has a unit for COVID-19 residents. Discussed strict return precautions with daughter. Discussed findings with patient and daughter. Patient and daughter state they understand and agree with plan.   Findings and plan of care discussed with supervising physician Dr. Tyrone Nine who personally evaluated and examined this patient.  DACOTAH CABELLO was evaluated in Emergency Department on 08/31/2018 for the symptoms described in the history of present illness. He was evaluated in the context of  the global COVID-19 pandemic, which necessitated consideration that the patient might be at risk for infection with the SARS-CoV-2 virus that causes COVID-19. Institutional protocols and algorithms that pertain to the evaluation of patients at risk for COVID-19 are in a state of rapid change based on information released by regulatory bodies including the CDC  and federal and state organizations. These policies and algorithms were followed during the patient's care in the ED.   Final Clinical Impressions(s) / ED Diagnoses   Final diagnoses:  Generalized weakness  COVID-19    ED Discharge Orders    None       Arville Lime, PA-C 08/31/18 [place], [place], DO 08/31/18 2321

## 2018-08-31 NOTE — ED Notes (Addendum)
Spoke with [facility] @ [address].  They do have a COVID unit @ [employer].  She texted DON there to confirm pt can come back.  Pt going to Room 105.

## 2018-08-31 NOTE — ED Notes (Signed)
[facility] called for transport, covid +

## 2018-08-31 NOTE — ED Notes (Signed)
Justin [name], Daughter [phone]

## 2018-08-31 NOTE — ED Triage Notes (Signed)
Per EMS- pt w baseline dementia here from [facility]. PT son has been visiting through the window and noted that his father looked weaker than normal and asked the facility to call EMS. Multiple positive COVIDS at facility. Rectal temp obtained, 100.3

## 2018-08-31 NOTE — Discharge Instructions (Addendum)
You have been seen today for generalized weakness. Please read and follow all provided instructions.   1. Medications: tylenol if patient has a fever, usual home medications 2. Treatment: rest, drink plenty of fluids, please monitor patient's symptoms and return to the ER for new or worsening symptoms 3. Follow Up: Please follow up with your primary doctor in 2 days for discussion of your diagnoses and further evaluation after today's visit; if you do not have a primary care doctor use the resource guide provided to find one; Please return to the ER for any new or worsening symptoms. Please obtain all of your results from medical records or have your doctors office obtain the results - share them with your doctor - you should be seen at your doctors office. Call today to arrange your follow up.   Take medications as prescribed. Please review all of the medicines and only take them if you do not have an allergy to them. Return to the emergency room for worsening condition or new concerning symptoms. Follow up with your regular doctor. If you don't have a regular doctor use one of the numbers below to establish a primary care doctor.  Please be aware that if you are taking birth control pills, taking other prescriptions, ESPECIALLY ANTIBIOTICS may make the birth control ineffective - if this is the case, either do not engage in sexual activity or use alternative methods of birth control such as condoms until you have finished the medicine and your family doctor says it is OK to restart them. If you are on a blood thinner such as COUMADIN, be aware that any other medicine that you take may cause the coumadin to either work too much, or not enough - you should have your coumadin level rechecked in next 7 days if this is the case.  ?  It is also a possibility that you have an allergic reaction to any of the medicines that you have been prescribed - Everybody reacts differently to medications and while MOST  people have no trouble with most medicines, you may have a reaction such as nausea, vomiting, rash, swelling, shortness of breath. If this is the case, please stop taking the medicine immediately and contact your physician.  ?  You should return to the ER if you develop severe or worsening symptoms.   Emergency Department Resource Guide 1) Find a Doctor and Pay Out of Pocket Although you won't have to find out who is covered by your insurance plan, it is a good idea to ask around and get recommendations. You will then need to call the office and see if the doctor you have chosen will accept you as a new patient and what types of options they offer for patients who are self-pay. Some doctors offer discounts or will set up payment plans for their patients who do not have insurance, but you will need to ask so you aren't surprised when you get to your appointment.  2) Contact Your Local Health Department Not all health departments have doctors that can see patients for sick visits, but many do, so it is worth a call to see if yours does. If you don't know where your local health department is, you can check in your phone book. The CDC also has a tool to help you locate your state's health department, and many state websites also have listings of all of their local health departments.  3) Find a [facility] If your illness is not likely to  be very severe or complicated, you may want to try a walk in clinic. These are popping up all over the country in pharmacies, drugstores, and shopping centers. They're usually staffed by nurse practitioners or physician assistants that have been trained to treat common illnesses and complaints. They're usually fairly quick and inexpensive. However, if you have serious medical issues or chronic medical problems, these are probably not your best option.  No Primary Care Doctor: Call Health Connect at  [phone] - they can help you locate a primary care doctor that   accepts your insurance, provides certain services, etc. Physician Referral Service- [phone]  Chronic Pain Problems: Organization         Address  Phone   Notes  [facility]  [phone] Patients need to be referred by their primary care doctor.   Medication Assistance: Organization         Address  Phone   Notes  [facility] Medication [facility] [place], [place] [address] [phone] --Must be a resident of [facility] -- Must have NO insurance coverage whatsoever (no Medicaid/ Medicare, etc.) -- The pt. MUST have a primary care doctor that directs their care regularly and follows them in the community   [facility]  [phone]   [employer]  [phone]    Agencies that provide inexpensive medical care: Organization         Address  Phone   Notes  [place]  [phone]   [facility]    [phone]   [facility] [place], [place] [address] [phone]   [place] [address], [place] [phone]   [facility]    [phone]   [facility]    [phone]   [place] and [place] [address], [place] Phone:  [phone], Fax:  [phone] Hours of Operation:  9 am - 6 pm, M-F.  Also accepts Medicaid/Medicare and self-pay.  [facility] for [place], [address], [place] Phone: [phone], Fax: [phone]. Hours of Operation:  8:30 am - 5:30 pm, M-F.  Also accepts Medicaid and self-pay.  [facility] [place] [address], [place] Phone: [phone]   [place], [place], [place] [phone], Ext. [phone] Mondays & Thursdays: 7-9 AM.  First 15 patients are seen on a first come, first serve basis.    [place] Providers:  Organization          Address  Phone   Notes  [facility] [address], [place] [phone] Also accepts self-pay patients.  [facility] [address], [place]  [phone]   [place], [address], [place] [phone]   [facility] [address] [place] [phone]   Lucianne Lei [address], [place]   [phone] Only accepts [place] patients after they have their name applied to their card.   Self-Pay (no insurance) in [facility]:  Organization         Address  Phone   Notes  Sickle Cell Patients, [facility] [place] [phone]   [facility] [phone]  [place] [phone]   [facility] [place]  [address], [address], [place] [phone]   [facility]/Dr. Osei-Bonsu  [address], [place] or [address], [place] [phone] Phone number for both [place] and [place] locations is the same.  Urgent Medical and [facility] [address] [place] [phone]   [facility] [address], [place] or [address] [phone]   [facility] [address], [place] [phone], phone; [phone], fax Sees patients 1st and 3rd Saturday of every month.  Must not qualify for public or private insurance (i.e. Medicaid, Medicare, [place] Health Choice, Veterans' Benefits)  Household income should be no more than 200% of the poverty level The clinic cannot treat you if you are pregnant or think you are pregnant  Sexually transmitted diseases are not treated at the clinic.

## 2018-08-31 NOTE — ED Notes (Signed)
[phone] Justin [name], relation to pt please return call w/ update

## 2018-08-31 NOTE — ED Notes (Signed)
PA and MD are aware of COVID +.

## 2018-08-31 NOTE — ED Notes (Signed)
IV team at bedside and will get 2nd set of blood cultures.

## 2018-09-02 LAB — URINE CULTURE: Culture: <10000 — AB

## 2018-09-05 LAB — CULTURE, BLOOD (ROUTINE X 2)
Culture: NO GROWTH
Culture: NO GROWTH
Special Requests: ADEQUATE
Special Requests: ADEQUATE

## 2021-05-21 ENCOUNTER — Inpatient Hospital Stay (HOSPITAL_COMMUNITY)
Admission: EM | Admit: 2021-05-21 | Discharge: 2021-05-25 | DRG: 064 | Disposition: A | Discharge status: Skilled Nursing Facility | Payer: Medicare Other | Source: Skilled Nursing Facility | Attending: Family Medicine | Admitting: Family Medicine

## 2021-05-21 ENCOUNTER — Emergency Department (HOSPITAL_COMMUNITY): Payer: Medicare Other

## 2021-05-21 ENCOUNTER — Inpatient Hospital Stay (HOSPITAL_COMMUNITY): Payer: Medicare Other

## 2021-05-21 ENCOUNTER — Other Ambulatory Visit: Payer: Self-pay

## 2021-05-21 ENCOUNTER — Encounter (HOSPITAL_COMMUNITY): Payer: Self-pay | Admitting: Emergency Medicine

## 2021-05-21 DIAGNOSIS — Z79899 Other long term (current) drug therapy: Secondary | ICD-10-CM | POA: Diagnosis not present

## 2021-05-21 DIAGNOSIS — U071 COVID-19: Secondary | ICD-10-CM | POA: Diagnosis present

## 2021-05-21 DIAGNOSIS — I63512 Cerebral infarction due to unspecified occlusion or stenosis of left middle cerebral artery: Secondary | ICD-10-CM | POA: Diagnosis present

## 2021-05-21 DIAGNOSIS — E782 Mixed hyperlipidemia: Secondary | ICD-10-CM | POA: Diagnosis present

## 2021-05-21 DIAGNOSIS — G8191 Hemiplegia, unspecified affecting right dominant side: Secondary | ICD-10-CM | POA: Diagnosis present

## 2021-05-21 DIAGNOSIS — K861 Other chronic pancreatitis: Secondary | ICD-10-CM | POA: Diagnosis present

## 2021-05-21 DIAGNOSIS — Z794 Long term (current) use of insulin: Secondary | ICD-10-CM | POA: Diagnosis not present

## 2021-05-21 DIAGNOSIS — N1832 Chronic kidney disease, stage 3b: Secondary | ICD-10-CM | POA: Diagnosis present

## 2021-05-21 DIAGNOSIS — I25119 Atherosclerotic heart disease of native coronary artery with unspecified angina pectoris: Secondary | ICD-10-CM

## 2021-05-21 DIAGNOSIS — J449 Chronic obstructive pulmonary disease, unspecified: Secondary | ICD-10-CM | POA: Diagnosis present

## 2021-05-21 DIAGNOSIS — I2510 Atherosclerotic heart disease of native coronary artery without angina pectoris: Secondary | ICD-10-CM | POA: Diagnosis present

## 2021-05-21 DIAGNOSIS — E1169 Type 2 diabetes mellitus with other specified complication: Secondary | ICD-10-CM | POA: Diagnosis present

## 2021-05-21 DIAGNOSIS — R29810 Facial weakness: Secondary | ICD-10-CM | POA: Diagnosis present

## 2021-05-21 DIAGNOSIS — G9341 Metabolic encephalopathy: Secondary | ICD-10-CM | POA: Diagnosis present

## 2021-05-21 DIAGNOSIS — D509 Iron deficiency anemia, unspecified: Secondary | ICD-10-CM | POA: Diagnosis present

## 2021-05-21 DIAGNOSIS — Z7189 Other specified counseling: Secondary | ICD-10-CM | POA: Diagnosis not present

## 2021-05-21 DIAGNOSIS — I10 Essential (primary) hypertension: Secondary | ICD-10-CM | POA: Diagnosis not present

## 2021-05-21 DIAGNOSIS — Z66 Do not resuscitate: Secondary | ICD-10-CM | POA: Diagnosis present

## 2021-05-21 DIAGNOSIS — F0390 Unspecified dementia without behavioral disturbance: Secondary | ICD-10-CM | POA: Diagnosis present

## 2021-05-21 DIAGNOSIS — E1142 Type 2 diabetes mellitus with diabetic polyneuropathy: Secondary | ICD-10-CM | POA: Diagnosis present

## 2021-05-21 DIAGNOSIS — Z7902 Long term (current) use of antithrombotics/antiplatelets: Secondary | ICD-10-CM | POA: Diagnosis not present

## 2021-05-21 DIAGNOSIS — Z8673 Personal history of transient ischemic attack (TIA), and cerebral infarction without residual deficits: Secondary | ICD-10-CM | POA: Diagnosis present

## 2021-05-21 DIAGNOSIS — E1122 Type 2 diabetes mellitus with diabetic chronic kidney disease: Secondary | ICD-10-CM | POA: Diagnosis present

## 2021-05-21 DIAGNOSIS — Z87891 Personal history of nicotine dependence: Secondary | ICD-10-CM

## 2021-05-21 DIAGNOSIS — Z8249 Family history of ischemic heart disease and other diseases of the circulatory system: Secondary | ICD-10-CM

## 2021-05-21 DIAGNOSIS — Z7984 Long term (current) use of oral hypoglycemic drugs: Secondary | ICD-10-CM | POA: Diagnosis not present

## 2021-05-21 DIAGNOSIS — I639 Cerebral infarction, unspecified: Secondary | ICD-10-CM | POA: Diagnosis present

## 2021-05-21 DIAGNOSIS — Z1389 Encounter for screening for other disorder: Secondary | ICD-10-CM

## 2021-05-21 DIAGNOSIS — D72829 Elevated white blood cell count, unspecified: Secondary | ICD-10-CM | POA: Diagnosis not present

## 2021-05-21 DIAGNOSIS — Z833 Family history of diabetes mellitus: Secondary | ICD-10-CM | POA: Diagnosis not present

## 2021-05-21 DIAGNOSIS — N179 Acute kidney failure, unspecified: Secondary | ICD-10-CM | POA: Diagnosis not present

## 2021-05-21 DIAGNOSIS — E119 Type 2 diabetes mellitus without complications: Secondary | ICD-10-CM

## 2021-05-21 DIAGNOSIS — D631 Anemia in chronic kidney disease: Secondary | ICD-10-CM | POA: Diagnosis present

## 2021-05-21 DIAGNOSIS — I129 Hypertensive chronic kidney disease with stage 1 through stage 4 chronic kidney disease, or unspecified chronic kidney disease: Secondary | ICD-10-CM | POA: Diagnosis present

## 2021-05-21 DIAGNOSIS — R4781 Slurred speech: Secondary | ICD-10-CM | POA: Diagnosis present

## 2021-05-21 DIAGNOSIS — Z0189 Encounter for other specified special examinations: Secondary | ICD-10-CM

## 2021-05-21 DIAGNOSIS — N390 Urinary tract infection, site not specified: Secondary | ICD-10-CM | POA: Diagnosis present

## 2021-05-21 DIAGNOSIS — R451 Restlessness and agitation: Secondary | ICD-10-CM | POA: Diagnosis not present

## 2021-05-21 DIAGNOSIS — I6389 Other cerebral infarction: Secondary | ICD-10-CM | POA: Diagnosis not present

## 2021-05-21 DIAGNOSIS — M6281 Muscle weakness (generalized): Secondary | ICD-10-CM | POA: Diagnosis not present

## 2021-05-21 DIAGNOSIS — K219 Gastro-esophageal reflux disease without esophagitis: Secondary | ICD-10-CM | POA: Diagnosis present

## 2021-05-21 DIAGNOSIS — Z515 Encounter for palliative care: Secondary | ICD-10-CM | POA: Diagnosis not present

## 2021-05-21 LAB — BASIC METABOLIC PANEL
Anion gap: 7 (ref 5–15)
BUN: 28 mg/dL — ABNORMAL HIGH (ref 8–23)
CO2: 22 mmol/L (ref 22–32)
Calcium: 8.7 mg/dL — ABNORMAL LOW (ref 8.9–10.3)
Chloride: 104 mmol/L (ref 98–111)
Creatinine, Ser: 1.83 mg/dL — ABNORMAL HIGH (ref 0.61–1.24)
GFR, Estimated: 36 mL/min — ABNORMAL LOW (ref >60)
Glucose, Bld: 108 mg/dL — ABNORMAL HIGH (ref 70–99)
Potassium: 4.1 mmol/L (ref 3.5–5.1)
Sodium: 133 mmol/L — ABNORMAL LOW (ref 135–145)

## 2021-05-21 LAB — CBC WITH DIFFERENTIAL/PLATELET
Abs Immature Granulocytes: 0.08 10*3/uL — ABNORMAL HIGH (ref 0.00–0.07)
Basophils Absolute: 0.2 10*3/uL — ABNORMAL HIGH (ref 0.0–0.1)
Basophils Relative: 1 %
Eosinophils Absolute: 0.7 10*3/uL — ABNORMAL HIGH (ref 0.0–0.5)
Eosinophils Relative: 5 %
HCT: 31.5 % — ABNORMAL LOW (ref 39.0–52.0)
Hemoglobin: 10.1 g/dL — ABNORMAL LOW (ref 13.0–17.0)
Immature Granulocytes: 1 %
Lymphocytes Relative: 36 %
Lymphs Abs: 4.8 10*3/uL — ABNORMAL HIGH (ref 0.7–4.0)
MCH: 28.7 pg (ref 26.0–34.0)
MCHC: 32.1 g/dL (ref 30.0–36.0)
MCV: 89.5 fL (ref 80.0–100.0)
Monocytes Absolute: 1.1 10*3/uL — ABNORMAL HIGH (ref 0.1–1.0)
Monocytes Relative: 9 %
Neutro Abs: 6.3 10*3/uL (ref 1.7–7.7)
Neutrophils Relative %: 48 %
Platelets: 358 10*3/uL (ref 150–400)
RBC: 3.52 MIL/uL — ABNORMAL LOW (ref 4.22–5.81)
RDW: 15.7 % — ABNORMAL HIGH (ref 11.5–15.5)
WBC: 13.2 10*3/uL — ABNORMAL HIGH (ref 4.0–10.5)
nRBC: 0 % (ref 0.0–0.2)

## 2021-05-21 LAB — PROTIME-INR
INR: 1.1 (ref 0.8–1.2)
Prothrombin Time: 14.2 seconds (ref 11.4–15.2)

## 2021-05-21 LAB — CBG MONITORING, ED
Glucose-Capillary: 109 mg/dL — ABNORMAL HIGH (ref 70–99)
Glucose-Capillary: 330 mg/dL — ABNORMAL HIGH (ref 70–99)
Glucose-Capillary: 72 mg/dL (ref 70–99)

## 2021-05-21 LAB — RESP PANEL BY RT-PCR (FLU A&B, COVID) ARPGX2
Influenza A by PCR: NEGATIVE
Influenza B by PCR: NEGATIVE
SARS Coronavirus 2 by RT PCR: POSITIVE — AB

## 2021-05-21 LAB — APTT: aPTT: 33 seconds (ref 24–36)

## 2021-05-21 IMAGING — DX DG CHEST 1V PORT
1 series · 1 of 1 positions shown · non-contrast
Comparison: [DATE].

CLINICAL DATA: Weakness.  Facial droop.

EXAM:
PORTABLE CHEST 1 VIEW

[chest]
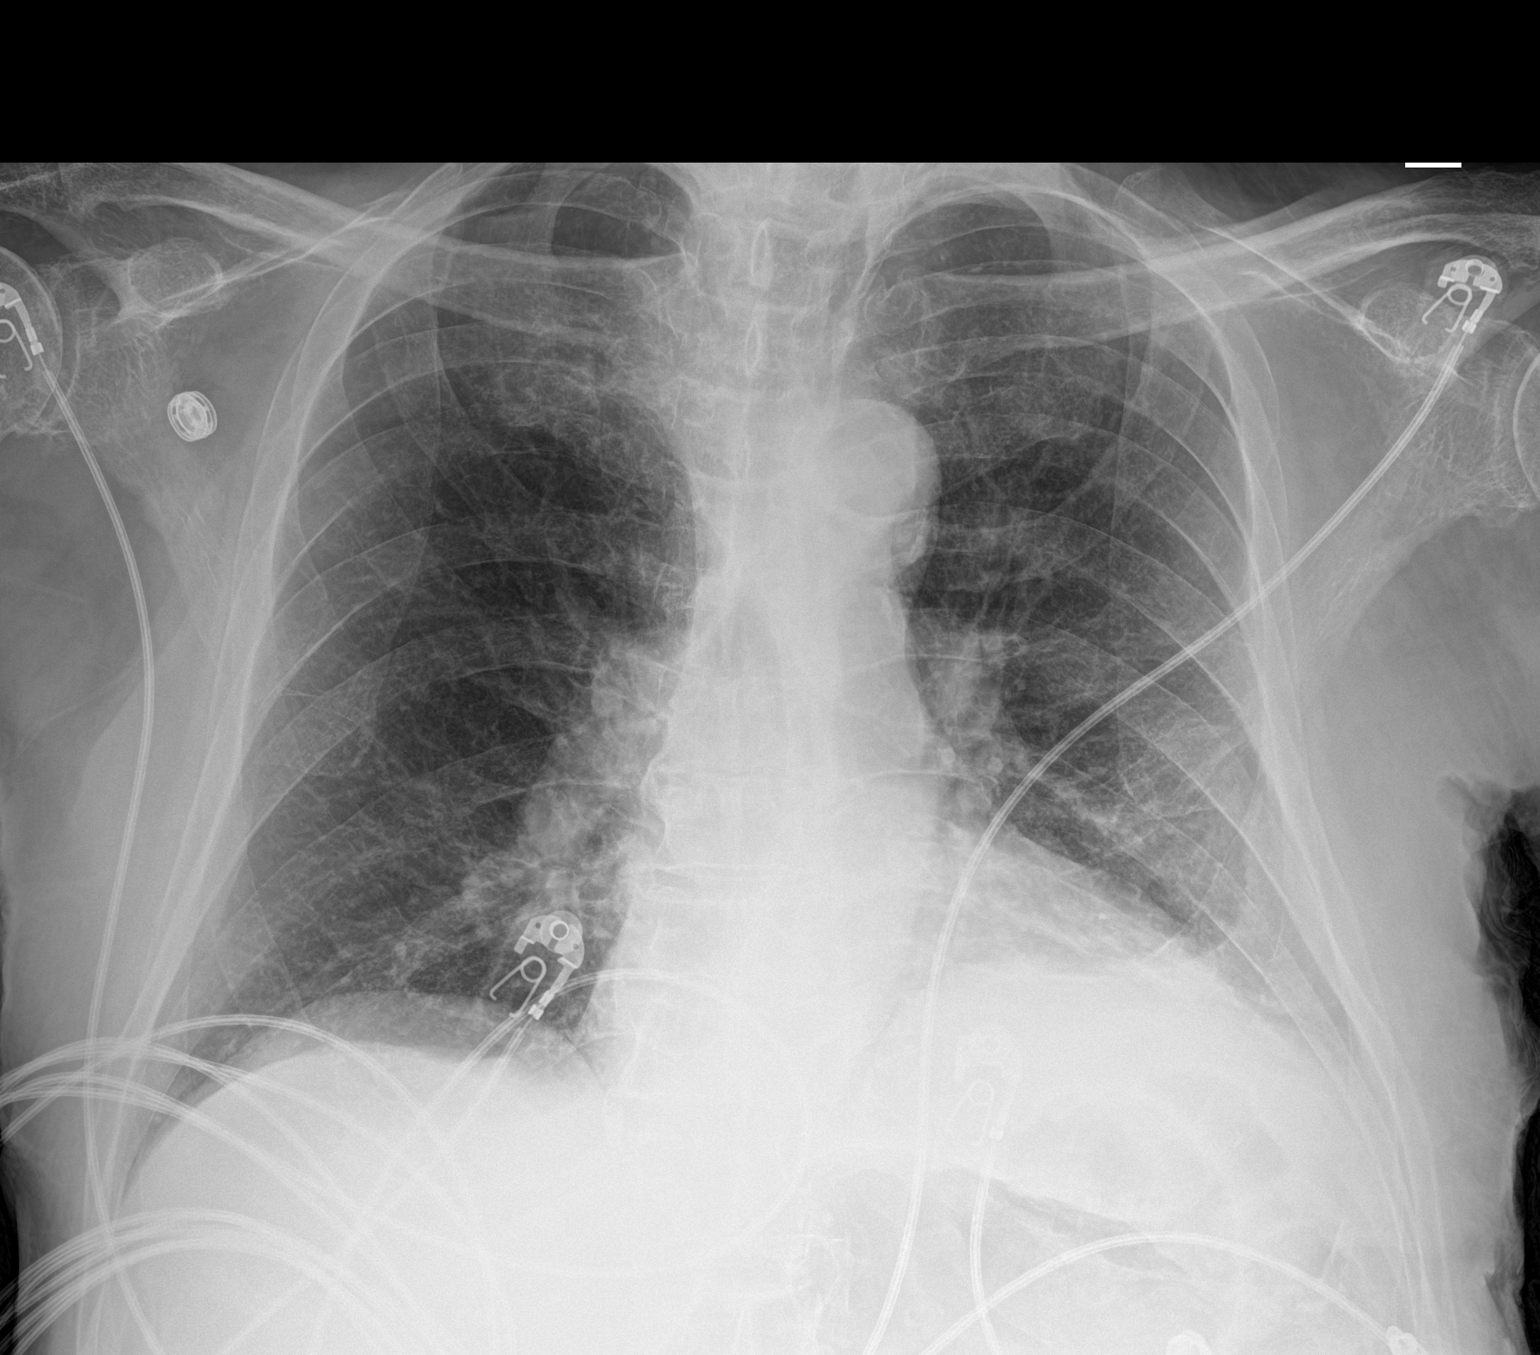

[1 of 1 positions shown; findings below may reference images not displayed]

FINDINGS: Cardiac silhouette normal in size.  No mediastinal or hilar masses.

Linear opacities are noted at the left lung base consistent with
atelectasis or scarring, stable. Remainder of the lungs is clear. No
convincing pleural effusion. No pneumothorax.

Skeletal structures are grossly intact.
IMPRESSION: No active disease.

## 2021-05-21 IMAGING — CT CT HEAD W/O CM
3 series · 15 of 47 positions shown, 18 images · non-contrast
Comparison: None.

CLINICAL DATA: Six Neuro deficit, acute, stroke suspected. Right
side facial droop, right arm and leg weakness



[Series 4: head 5.0 h30s · axial · 0.43mm/px · z∈[-119,+11]mm · 9 of 32 slices shown, 12 images]
[im 3/32  brain]
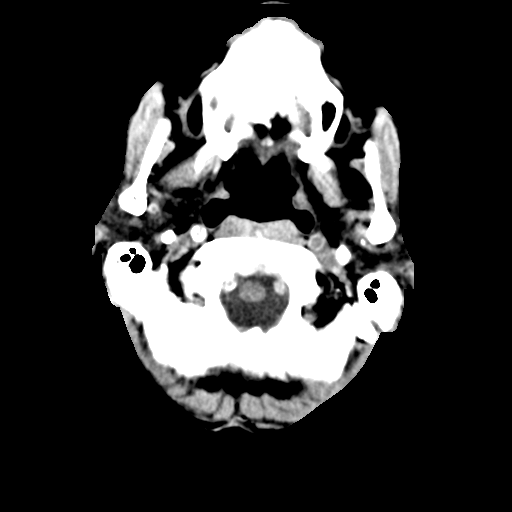
[im 3/32  bone]
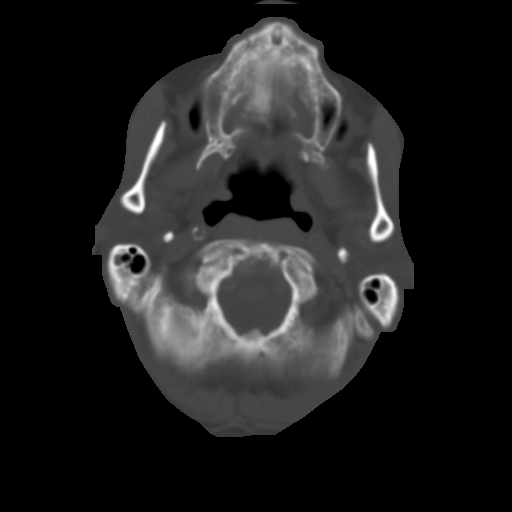
[im 6/32  brain]
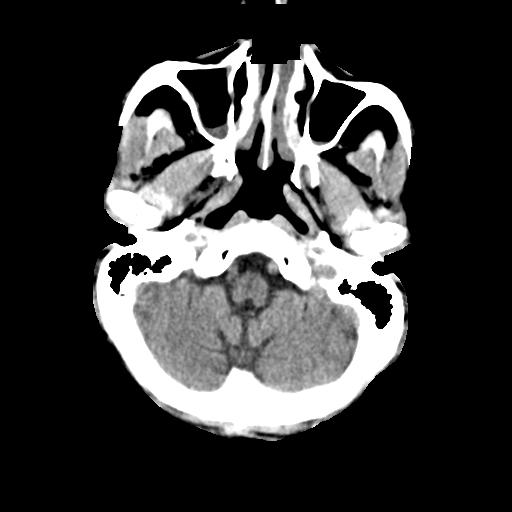
[im 9/32  brain]
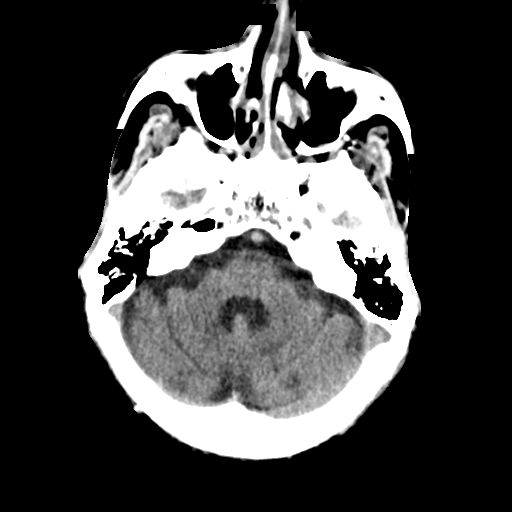
[im 12/32  brain]
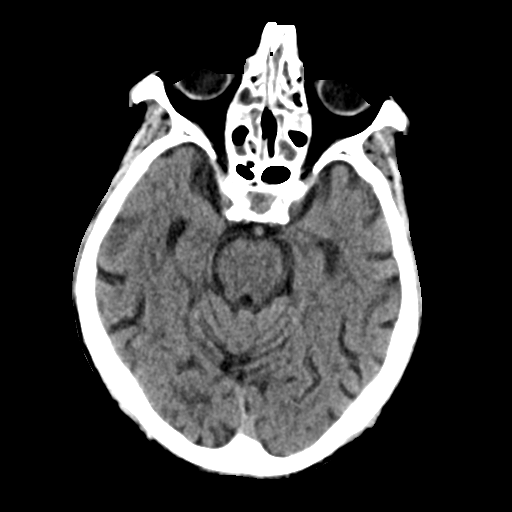
[im 17/32  brain]
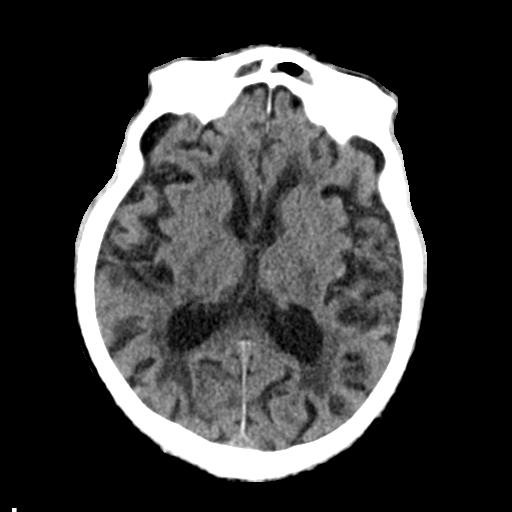
[im 17/32  bone]
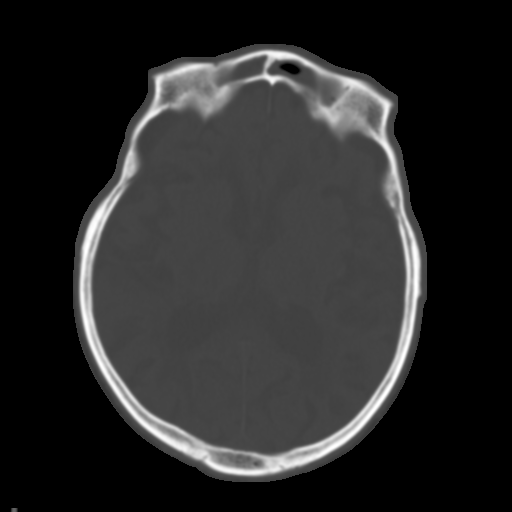
[im 20/32  brain]
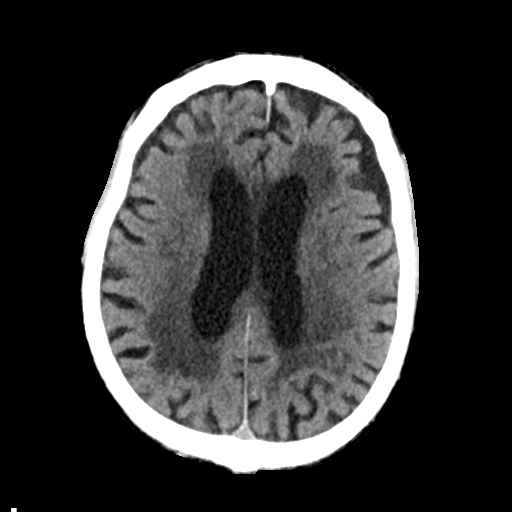
[im 23/32  brain]
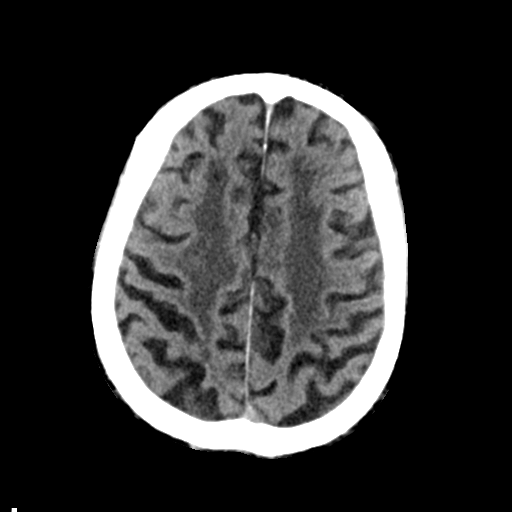
[im 26/32  brain]
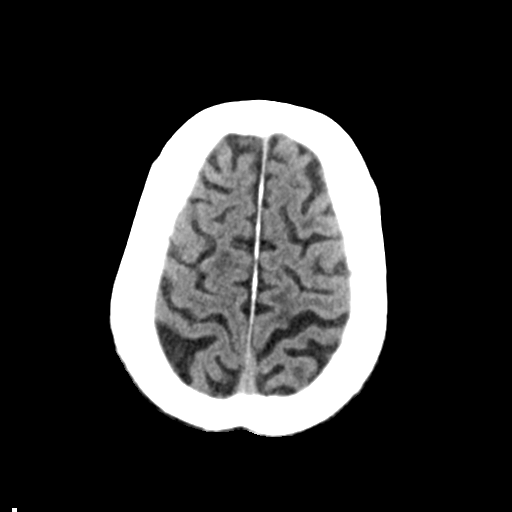
[im 29/32  brain]
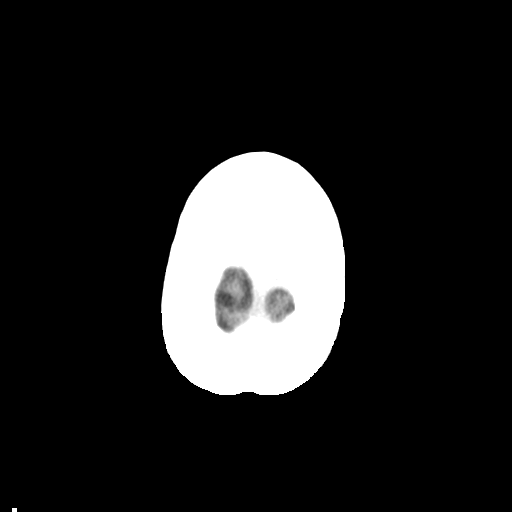
[im 29/32  bone]
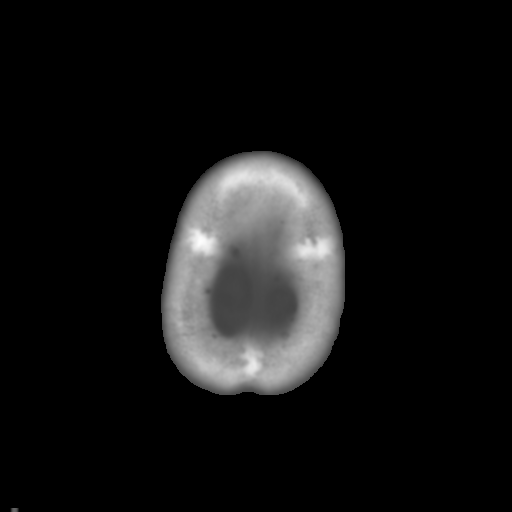

[Series 5: head 3.0 mpr cor · coronal · 0.34mm/px · 3 of 69 slices shown]
[im 24/69  brain]
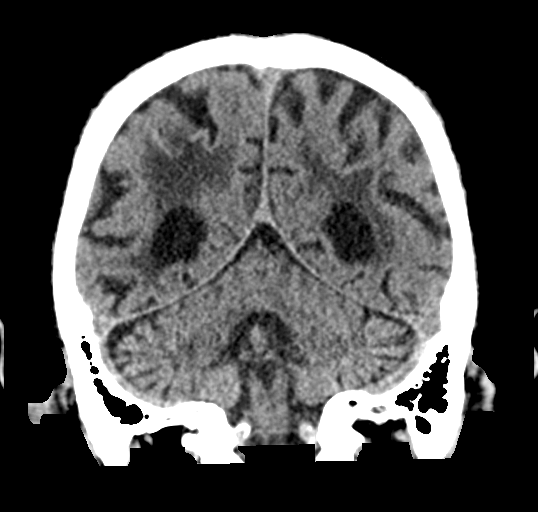
[im 31/69  brain]
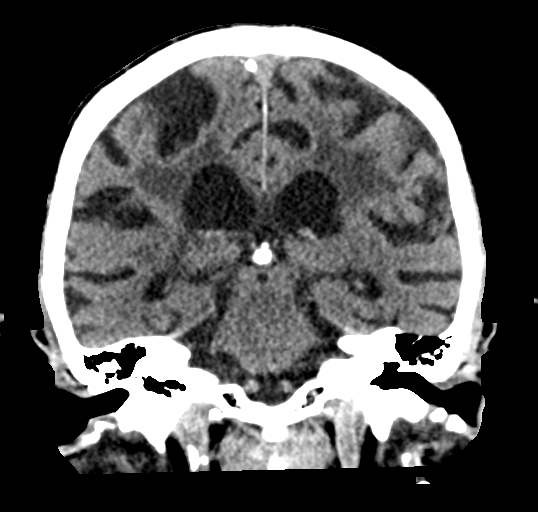
[im 38/69  brain]
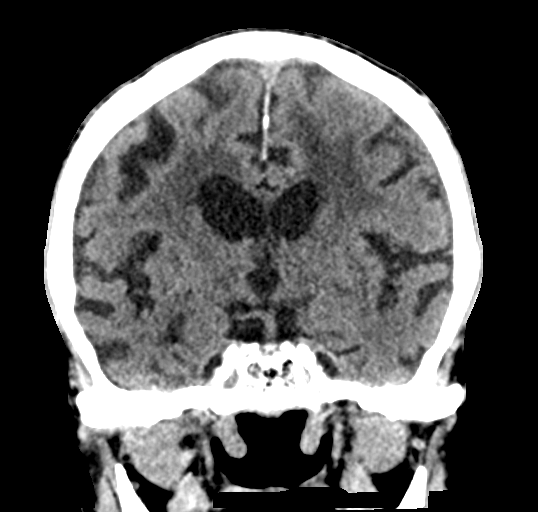

[Series 6: head 3.0 mpr sag · sagittal · 0.32mm/px · 3 of 57 slices shown]
[im 19/57  brain]
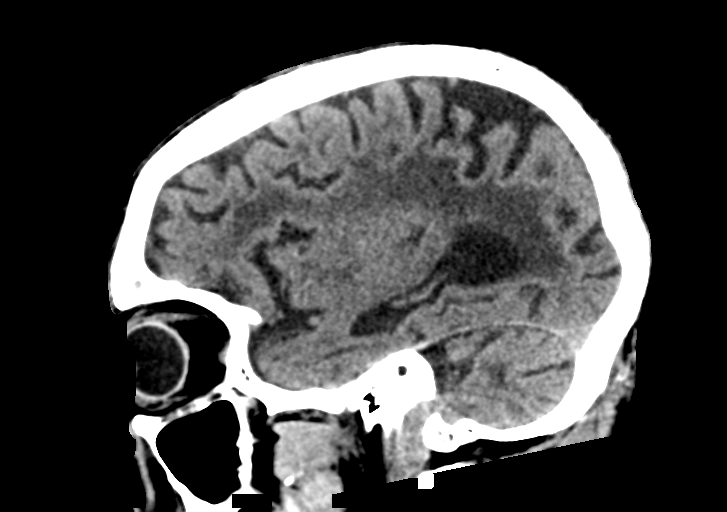
[im 29/57  brain]
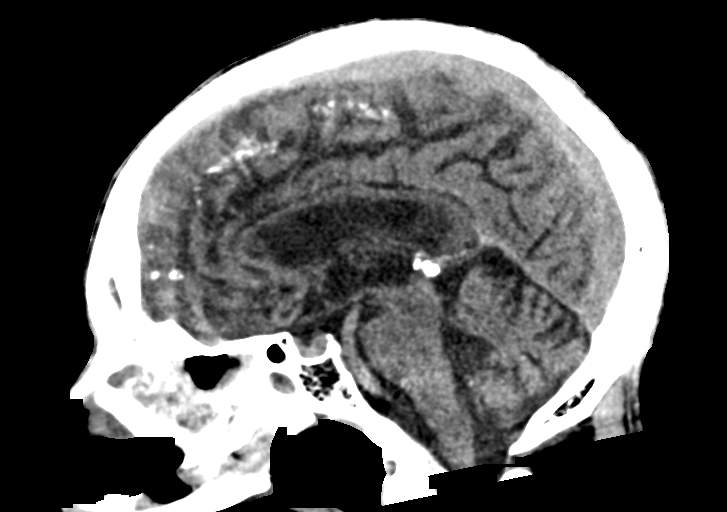
[im 38/57  brain]
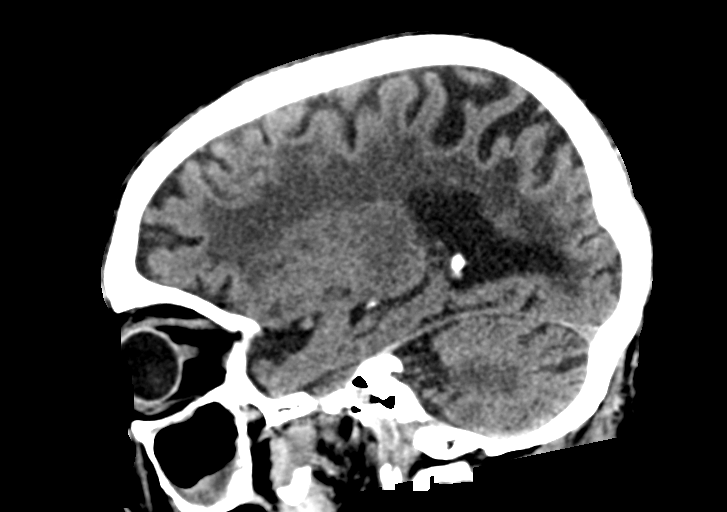

[15 of 47 positions shown; findings below may reference images not displayed]

FINDINGS: Brain: There is atrophy and chronic small vessel disease changes.
Old bilateral cerebellar infarcts. No acute intracranial
abnormality. Specifically, no hemorrhage, hydrocephalus, mass
lesion, acute infarction, or significant intracranial injury.

Vascular: No hyperdense vessel or unexpected calcification.

Skull: No acute calvarial abnormality.

Sinuses/Orbits: Mucosal thickening throughout the paranasal sinuses.
Small air-fluid levels in the maxillary sinuses bilaterally.

Other: None
IMPRESSION: Old bilateral cerebellar infarcts.

Atrophy, chronic microvascular disease.

No acute intracranial abnormality.

## 2021-05-21 IMAGING — DX DG ABDOMEN 1V
1 series · 1 of 1 positions shown · non-contrast
Comparison: Abdominal radiograph from [[facility]].

CLINICAL DATA: An 86-year-old male presents for MRI clearance.

EXAM:
ABDOMEN - 1 VIEW

[abdomen]
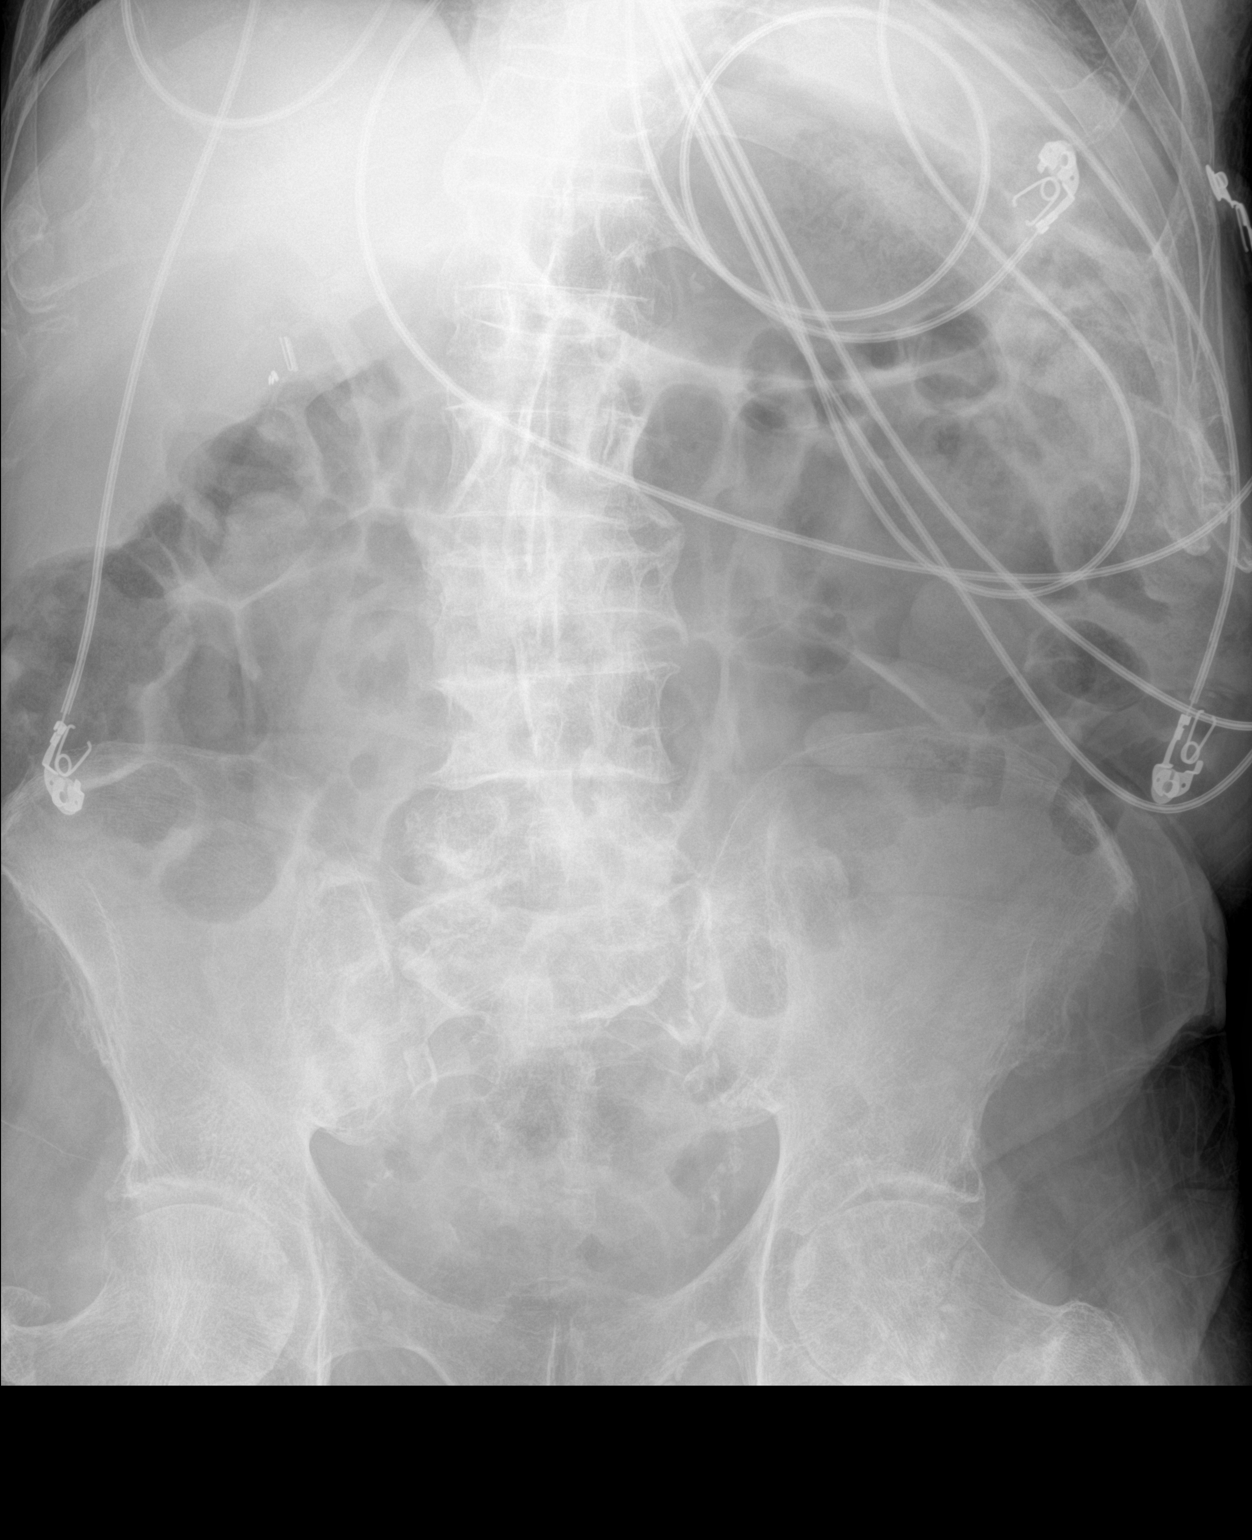

[1 of 1 positions shown; findings below may reference images not displayed]

FINDINGS: EKG leads project over the chest and abdomen.

Cholecystectomy clips are present in the RIGHT upper quadrant.

Mild gaseous distension of the stomach. Moderate gaseous distension
of colonic loops. Signs of rectal gas. No substantial small bowel
dilation.

No acute musculoskeletal findings to the extent evaluated.

RIGHT and LEFT flank are excluded from view slightly on today's
study which does encompass most of the abdomen and the pelvis. No
metallic foreign bodies aside from cholecystectomy clips are noted.
IMPRESSION: 1. No metallic foreign bodies aside from cholecystectomy clips about
the visualized abdomen.
2. Moderate gaseous distension of the stomach and colon. No acute
findings.

## 2021-05-21 MED ORDER — BUSPIRONE HCL 10 MG PO TABS
15.0000 mg | ORAL_TABLET | Freq: Two times a day (BID) | ORAL | Status: DC | PRN
  Administered 2021-05-22: 15 mg via ORAL
  Filled 2021-05-21: qty 2

## 2021-05-21 MED ORDER — ASCORBIC ACID 500 MG PO TABS
500.0000 mg | ORAL_TABLET | Freq: Every day | ORAL | Status: DC
  Administered 2021-05-22 – 2021-05-25 (×4): 500 mg via ORAL
  Filled 2021-05-21 (×4): qty 1

## 2021-05-21 MED ORDER — INSULIN ASPART 100 UNIT/ML IJ SOLN: 12.0000 [IU] | Freq: Three times a day (TID) | INTRAMUSCULAR | Status: DC

## 2021-05-21 MED ORDER — ONDANSETRON HCL 4 MG PO TABS: 4.0000 mg | ORAL_TABLET | Freq: Four times a day (QID) | ORAL | Status: DC | PRN

## 2021-05-21 MED ORDER — PANTOPRAZOLE SODIUM 40 MG PO TBEC
40.0000 mg | DELAYED_RELEASE_TABLET | Freq: Every day | ORAL | Status: DC
  Administered 2021-05-22 – 2021-05-25 (×4): 40 mg via ORAL
  Filled 2021-05-21 (×4): qty 1

## 2021-05-21 MED ORDER — ADULT MULTIVITAMIN W/MINERALS CH
1.0000 | ORAL_TABLET | Freq: Every day | ORAL | Status: DC
  Administered 2021-05-22 – 2021-05-25 (×4): 1 via ORAL
  Filled 2021-05-21 (×4): qty 1

## 2021-05-21 MED ORDER — ENOXAPARIN SODIUM 40 MG/0.4ML IJ SOSY
40.0000 mg | PREFILLED_SYRINGE | INTRAMUSCULAR | Status: DC
  Administered 2021-05-22 – 2021-05-24 (×4): 40 mg via SUBCUTANEOUS
  Filled 2021-05-21 (×4): qty 0.4

## 2021-05-21 MED ORDER — ATORVASTATIN CALCIUM 40 MG PO TABS: 40.0000 mg | ORAL_TABLET | Freq: Every day | ORAL | Status: DC

## 2021-05-21 MED ORDER — CLOPIDOGREL BISULFATE 75 MG PO TABS
75.0000 mg | ORAL_TABLET | Freq: Every day | ORAL | Status: DC
  Administered 2021-05-22 – 2021-05-25 (×4): 75 mg via ORAL
  Filled 2021-05-21 (×4): qty 1

## 2021-05-21 MED ORDER — ACETAMINOPHEN 650 MG RE SUPP: 650.0000 mg | Freq: Four times a day (QID) | RECTAL | Status: DC | PRN

## 2021-05-21 MED ORDER — POLYETHYLENE GLYCOL 3350 17 G PO PACK: 17.0000 g | PACK | Freq: Every day | ORAL | Status: DC | PRN

## 2021-05-21 MED ORDER — LORATADINE 10 MG PO TABS
10.0000 mg | ORAL_TABLET | Freq: Every day | ORAL | Status: DC
  Administered 2021-05-22 – 2021-05-25 (×4): 10 mg via ORAL
  Filled 2021-05-21 (×4): qty 1

## 2021-05-21 MED ORDER — BENZONATATE 100 MG PO CAPS: 100.0000 mg | ORAL_CAPSULE | Freq: Three times a day (TID) | ORAL | Status: DC | PRN

## 2021-05-21 MED ORDER — NIRMATRELVIR/RITONAVIR (PAXLOVID) TABLET (RENAL DOSING)
2.0000 | ORAL_TABLET | Freq: Two times a day (BID) | ORAL | Status: DC
  Administered 2021-05-22 – 2021-05-23 (×3): 2 via ORAL
  Filled 2021-05-21: qty 20

## 2021-05-21 MED ORDER — INSULIN GLARGINE-YFGN 100 UNIT/ML ~~LOC~~ SOLN
38.0000 [IU] | Freq: Every day | SUBCUTANEOUS | Status: DC
  Filled 2021-05-21: qty 0.38

## 2021-05-21 MED ORDER — DIVALPROEX SODIUM 125 MG PO DR TAB
125.0000 mg | DELAYED_RELEASE_TABLET | Freq: Two times a day (BID) | ORAL | Status: DC
  Administered 2021-05-22 – 2021-05-23 (×3): 125 mg via ORAL
  Filled 2021-05-21 (×5): qty 1

## 2021-05-21 MED ORDER — HYDRALAZINE HCL 20 MG/ML IJ SOLN: 10.0000 mg | Freq: Four times a day (QID) | INTRAMUSCULAR | Status: DC | PRN

## 2021-05-21 MED ORDER — ONDANSETRON HCL 4 MG/2ML IJ SOLN: 4.0000 mg | Freq: Four times a day (QID) | INTRAMUSCULAR | Status: DC | PRN

## 2021-05-21 MED ORDER — INSULIN ASPART 100 UNIT/ML IJ SOLN
0.0000 [IU] | Freq: Three times a day (TID) | INTRAMUSCULAR | Status: DC
  Administered 2021-05-23: 5 [IU] via SUBCUTANEOUS
  Administered 2021-05-24: 2 [IU] via SUBCUTANEOUS
  Administered 2021-05-24: 8 [IU] via SUBCUTANEOUS
  Administered 2021-05-24: 3 [IU] via SUBCUTANEOUS
  Administered 2021-05-24: 2 [IU] via SUBCUTANEOUS
  Administered 2021-05-25 (×2): 3 [IU] via SUBCUTANEOUS
  Administered 2021-05-25: 5 [IU] via SUBCUTANEOUS

## 2021-05-21 MED ORDER — FLUTICASONE FUROATE-VILANTEROL 100-25 MCG/ACT IN AEPB
1.0000 | INHALATION_SPRAY | Freq: Every day | RESPIRATORY_TRACT | Status: DC
  Administered 2021-05-22 – 2021-05-25 (×4): 1 via RESPIRATORY_TRACT
  Filled 2021-05-21: qty 28

## 2021-05-21 MED ORDER — ALBUTEROL SULFATE HFA 108 (90 BASE) MCG/ACT IN AERS
2.0000 | INHALATION_SPRAY | RESPIRATORY_TRACT | Status: DC | PRN
  Filled 2021-05-21: qty 6.7

## 2021-05-21 MED ORDER — ISOSORBIDE DINITRATE 10 MG PO TABS
10.0000 mg | ORAL_TABLET | Freq: Every day | ORAL | Status: DC
  Administered 2021-05-22 – 2021-05-24 (×3): 10 mg via ORAL
  Filled 2021-05-21 (×5): qty 1

## 2021-05-21 MED ORDER — GABAPENTIN 400 MG PO CAPS
400.0000 mg | ORAL_CAPSULE | Freq: Every day | ORAL | Status: DC
  Administered 2021-05-22 – 2021-05-24 (×4): 400 mg via ORAL
  Filled 2021-05-21 (×6): qty 1

## 2021-05-21 MED ORDER — ZINC SULFATE 220 (50 ZN) MG PO CAPS
220.0000 mg | ORAL_CAPSULE | Freq: Every day | ORAL | Status: DC
  Administered 2021-05-22 – 2021-05-25 (×4): 220 mg via ORAL
  Filled 2021-05-21 (×4): qty 1

## 2021-05-21 MED ORDER — GABAPENTIN 100 MG PO CAPS
100.0000 mg | ORAL_CAPSULE | Freq: Every day | ORAL | Status: DC
  Administered 2021-05-22 – 2021-05-25 (×4): 100 mg via ORAL
  Filled 2021-05-21 (×4): qty 1

## 2021-05-21 MED ORDER — ACETAMINOPHEN 325 MG PO TABS: 650.0000 mg | ORAL_TABLET | Freq: Four times a day (QID) | ORAL | Status: DC | PRN

## 2021-05-21 MED ORDER — STROKE: EARLY STAGES OF RECOVERY BOOK: Freq: Once | Status: DC

## 2021-05-21 MED ORDER — PANCRELIPASE (LIP-PROT-AMYL) 12000-38000 UNITS PO CPEP
24000.0000 [IU] | ORAL_CAPSULE | Freq: Three times a day (TID) | ORAL | Status: DC
  Administered 2021-05-22 – 2021-05-25 (×9): 24000 [IU] via ORAL
  Filled 2021-05-21 (×13): qty 2

## 2021-05-21 MED ORDER — ASPIRIN 81 MG PO CHEW
81.0000 mg | CHEWABLE_TABLET | Freq: Every day | ORAL | Status: DC
  Administered 2021-05-22 – 2021-05-25 (×4): 81 mg via ORAL
  Filled 2021-05-21 (×4): qty 1

## 2021-05-21 MED ORDER — ASPIRIN 81 MG PO CHEW
324.0000 mg | CHEWABLE_TABLET | Freq: Once | ORAL | Status: AC
  Administered 2021-05-21: 324 mg via ORAL
  Filled 2021-05-21: qty 4

## 2021-05-21 MED ORDER — AZELASTINE HCL 0.1 % NA SOLN
2.0000 | Freq: Two times a day (BID) | NASAL | Status: DC
  Administered 2021-05-22 – 2021-05-25 (×7): 2 via NASAL
  Filled 2021-05-21: qty 30

## 2021-05-21 NOTE — Assessment & Plan Note (Addendum)
Patient presenting with new onset right-sided weakness and right facial droop concerning for acute ischemic stroke. MRI of the brain showed Moderately motion study with acute infarcts in the left perirolandic frontoparietal cortex and smaller acute infarcts in bilateral parietal white matter. MRA head and neck does not show any large vessel occlusion.  - Per neurology > aspirin and plavix for 3 weeks, then aspirin alone.  - Home regimen of daily statin therapy to be continued

## 2021-05-21 NOTE — ED Notes (Signed)
Patient transported to CT 

## 2021-05-21 NOTE — Consult Note (Signed)
NEURO HOSPITALIST CONSULT NOTE   Requestig physician: Dr. Maryan Rued  Reason for Consult: Subacute stroke with right sided weakness  History obtained from:  Chart     HPI:                                                                                                                                          Justin [name] is an 87 y.o. male with a  PMHx of arthritis, DM, HTN, hypercholesterolemia, iron deficiency, prior episode of acute pancreatitis in 2014, prior stroke and TIA who presented to the ED via EMS this evening from [facility] with new onset of right facial droop, plegic RUE and paretic RLE. LKN was 2300 yesterday prior to the patient going to bed. Symptoms were noted this AM by facility staff when patient woke up. There was a delay in decision to take to the ED as patient had a MOST form on file stating that he would not want to go to the hospital in the event of illness. A family member who is his HCPOA then arrived and decided that the prior request on the MOST form from 2020 was no longer valid and that the patient should be brought to the hospital. EMS was then called. He does have a prior history of stroke with right sided deficits that had resolved. On arrival to the ED his CBG was 157 with all other VSS.   Home medications include atorvastatin and Plavix.   Past Medical History:  Diagnosis Date   Arthritis    Diabetes mellitus    HTN (hypertension)    Hypercholesteremia    Iron deficiency    Pancreatitis, acute 06/02/2012   Diagnosed 05/2012    Personal history of colonic adenomas 04/15/2013   04/15/2013 2 diminutive polyps     Stroke [facility])    TIA (transient ischemic attack)     Past Surgical History:  Procedure Laterality Date   ARTERY BIOPSY  02/26/2011   Procedure: BIOPSY TEMPORAL ARTERY;  Surgeon: Willey Blade, MD;  Location: [place];  Service: General;  Laterality: Left;  Left temporal artery biospy   [place] / LIGATION  02/26/2011   left side   UPPER GASTROINTESTINAL ENDOSCOPY      Family History  Problem Relation Age of Onset   Heart disease Mother    Amblyopia Father    Diabetes Sister        x 2   Heart attack Sister             Social History:  reports that he quit smoking about  31 years ago. His smoking use included cigarettes. He has never used smokeless tobacco. He reports that he does not drink alcohol and does not use drugs.  No Known Allergies  HOME MEDICATIONS:                                                                                                                      Current Facility-Administered Medications on File Prior to Encounter  Medication Dose Route Frequency Provider Last Rate Last Admin   testosterone cypionate (DEPOTESTOTERONE CYPIONATE) injection 200 mg  200 mg Intramuscular Q14 Days Georgiann Mccoy M, PA-C   200 mg at 04/05/13 6770   Current Outpatient Medications on File Prior to Encounter  Medication Sig Dispense Refill   Alcohol Swabs PADS Use to check blood sugar 3 times daily. Dx: E10.9 300 each 3   atorvastatin (LIPITOR) 40 MG tablet Take 1 tablet (40 mg total) by mouth daily. 30 tablet 0   Blood Glucose Calibration (ACCU-CHEK AVIVA) SOLN Use to check blood sugar 3 times daily. Dx: E10.9 1 each 3   Blood Glucose Monitoring Suppl (ACCU-CHEK AVIVA PLUS) w/Device KIT Use to check blood sugar 3 times daily. Dx: E10.9 1 kit 0   clopidogrel (PLAVIX) 75 MG tablet TAKE 1 TABLET EVERY DAY 90 tablet 3   fish oil-omega-3 fatty acids 1000 MG capsule Take 2 g by mouth daily.       glipiZIDE (GLUCOTROL XL) 2.5 MG 24 hr tablet Take 1 tablet (2.5 mg total) by mouth daily with breakfast. 90 tablet 3   glucagon (GLUCAGON EMERGENCY) 1 MG injection Inject 1 mg into the muscle once as needed. 1 each 12   glucose blood (ACCU-CHEK AVIVA PLUS) test strip Use to  check blood sugar 3 times daily. Dx: E10.9 300 each 3   insulin aspart protamine- aspart (NOVOLOG MIX 70/30) (70-30) 100 UNIT/ML injection Inject 0.3-0.4 mLs (30-40 Units total) into the skin 2 (two) times daily before a meal. PENS, please! 24 mL 0   Insulin Glargine (LANTUS SOLOSTAR) 100 UNIT/ML Solostar Pen Inject 45 Units into the skin daily at 10 pm. 5 pen 1   Insulin Pen Needle 32G X 4 MM MISC Use as directed to inject insulin tidwc 100 each 11   Ipratropium-Albuterol (COMBIVENT) 20-100 MCG/ACT AERS respimat Inhale 1 puff into the lungs every 6 (six) hours. 4 g 3   l-methylfolate-B6-B12 (METANX) 3-35-2 MG TABS Take 1 tablet by mouth daily.       Lancets (ACCU-CHEK SOFT TOUCH) lancets Use to check blood sugar 3 times daily. Dx: E10.9 300 each 3   lidocaine (XYLOCAINE) 5 % ointment Apply 1 application topically 4 (four) times daily as needed. Apply enough to spread across rash, but less than 6 inches of ointment. 50 g 1   metFORMIN (GLUCOPHAGE) 1000 MG tablet TAKE 1 TABLET TWICE DAILY WITH MEALS (NEED MD APPOINTMENT) 180 tablet 0   Multiple Vitamins-Minerals (MULTIVITAMIN WITH MINERALS) tablet Take 1 tablet by mouth daily.  omeprazole (PRILOSEC) 40 MG capsule Take 1 capsule (40 mg total) by mouth daily. 30 capsule 0   Spacer/Aero-Holding [facility] Use as directed with combivent. Ok to dispense any spacer that works with the inhaler being rxed to pt. 1 each 1   traMADol (ULTRAM) 50 MG tablet Take 50 mg by mouth.     [DISCONTINUED] testosterone cypionate (DEPOTESTOTERONE CYPIONATE) 200 MG/ML injection Inject into the muscle every 14 (fourteen) days.       ROS:                                                                                                                                       In the context of cognitive impairment with limited speech output, the patient does not endorse any other symptoms   Blood pressure 127/69, pulse 86, temperature 98.5 F (36.9 C), temperature  source Oral, resp. rate 12, height 6' (1.829 m), weight 78 kg, SpO2 98 %.   General Examination:                                                                                                       Physical Exam  HEENT-  Media/AT   Lungs- Respirations unlabored Extremities- No edema  Neurological Examination Mental Status: Awake with decreased level of alertness. Cognitive slowing with increased latencies of verbal and motor responses. Speech is slow and dysarthric. Does not speak in full sentences; all answers to questions are 1-3 words in length. Able to name a pen and his nose, but not a cellphone. Able to count fingers. Follows all simple motor commands. Verbal perseveration to some questions. Oriented to "[facility]". Correctly identifies the state. Not oriented to the year, month or day of the week.  Cranial Nerves: II: Blinks to threat reliably on the left, but inconsistently on the right. Pupils round and reactive, right 3 mm >> 2 mm and left 2 mm >> 1.5 mm.   III,IV, VI: No ptosis. EOM full with some hesitancy on initiation of visual pursuits.  V: Temp sensation stated as "different" on each side but patient unable to specify which side VII: Right facial droop.  VIII: Hearing intact to voice IX,X: No hypophonia or hoarseness XI: Head is preferentially rotated slightly to the left XII: Tongue deviates slightly to the right when extended Motor: RUE flaccid tone, 0/5 RLE with weak flexion at hip and knee when asked to elevate, 2/5 LUE 5/5 LLE 5/5 Sensory: The patient does not move RUE to stimuli. Moves  LUE and BLE to stimulation Deep Tendon Reflexes: 1+ bilateral brachioradialis. Trace bilateral patellae.  Plantars: Mute bilaterally  Cerebellar: No ataxia with left FNF, but with significantly slowed movements. Unable to perform FNF on the right Gait: Unable to assess    Lab Results: Basic Metabolic Panel: Recent Labs  Lab 05/21/21 1817  NA 133*  K 4.1  CL 104  CO2 22   GLUCOSE 108*  BUN 28*  CREATININE 1.83*  CALCIUM 8.7*    CBC: Recent Labs  Lab 05/21/21 1817  WBC 13.2*  NEUTROABS 6.3  HGB 10.1*  HCT 31.5*  MCV 89.5  PLT 358    Cardiac Enzymes: No results for input(s): CKTOTAL, CKMB, CKMBINDEX, TROPONINI in the last 168 hours.  Lipid Panel: No results for input(s): CHOL, TRIG, HDL, CHOLHDL, VLDL, LDLCALC in the last 168 hours.  Imaging: CT Head Wo Contrast  Result Date: 05/21/2021 CLINICAL DATA:  Six Neuro deficit, acute, stroke suspected. Right side facial droop, right arm and leg weakness EXAM: CT HEAD WITHOUT CONTRAST TECHNIQUE: Contiguous axial images were obtained from the base of the skull through the vertex without intravenous contrast. RADIATION DOSE REDUCTION: This exam was performed according to the departmental dose-optimization program which includes automated exposure control, adjustment of the mA and/or kV according to patient size and/or use of iterative reconstruction technique. COMPARISON:  None. FINDINGS: Brain: There is atrophy and chronic small vessel disease changes. Old bilateral cerebellar infarcts. No acute intracranial abnormality. Specifically, no hemorrhage, hydrocephalus, mass lesion, acute infarction, or significant intracranial injury. Vascular: No hyperdense vessel or unexpected calcification. Skull: No acute calvarial abnormality. Sinuses/Orbits: Mucosal thickening throughout the paranasal sinuses. Small air-fluid levels in the maxillary sinuses bilaterally. Other: None IMPRESSION: Old bilateral cerebellar infarcts. Atrophy, chronic microvascular disease. No acute intracranial abnormality. Electronically Signed   By: Rolm Baptise M.D.   On: 05/21/2021 19:15   DG Chest Port 1 View  Result Date: 05/21/2021 CLINICAL DATA:  Weakness.  Facial droop. EXAM: PORTABLE CHEST 1 VIEW COMPARISON:  08/31/2018. FINDINGS: Cardiac silhouette normal in size.  No mediastinal or hilar masses. Linear opacities are noted at the left lung  base consistent with atelectasis or scarring, stable. Remainder of the lungs is clear. No convincing pleural effusion. No pneumothorax. Skeletal structures are grossly intact. IMPRESSION: No active disease. Electronically Signed   By: Lajean Manes M.D.   On: 05/21/2021 19:04     Assessment: 87 year old male presenting with new onset of right facial droop, plegic RUE and paretic RLE. He does have a prior history of stroke with right sided deficits that had resolved.  1. Exam reveals findings referable to the left MCA territory. Overall presentation is most consistent with a left MCA stroke.  2. CT head: Old bilateral cerebellar infarcts. Atrophy, chronic microvascular disease. No acute intracranial abnormality. 3. Out of the TNK time window. Overall clinical findings not consistent with LVO.  4. Stroke risk factors: DM, HTN, hypercholesterolemia, prior stroke and TIA. Of note, the patient had a left temporal artery biopsy in 2012 that was negative for temporal arteritis.   Recommendations: 1. HgbA1c, fasting lipid panel 2. MRI/MRA of the brain without contrast 3. PT consult, OT consult, Speech consult 4. Echocardiogram 5. Carotid ultrasound 6. Continue Plavix and atorvastatin 7. Risk factor modification 8. Telemetry monitoring 9. Frequent neuro checks 10. Continue permissive HTN until 5 PM tomorrow 11. NPO until passes stroke swallow screen   Electronically signed: Dr. Kerney Elbe 05/21/2021, 7:35 PM

## 2021-05-21 NOTE — ED Triage Notes (Signed)
Pt BIB [facility] from [facility]. Pt was acting normal before bed time last night which was around 2300. When waking up this morning staff reported R sided facial droop, R arm and leg weakness. Pt does have a prior hx of CVA with R sided deficits which had apparently resolved. Pt also noted to have low CBG this AM to which staff treated. EMS CBG 157. All other VSS. NAD at this time.

## 2021-05-21 NOTE — ED Notes (Signed)
Neuro has seen

## 2021-05-21 NOTE — ED Provider Notes (Signed)
[facility] EMERGENCY DEPARTMENT Provider Note   CSN: [id number] Arrival date & time: 05/21/21  1702     History  Chief Complaint  Patient presents with   Facial Droop    Justin [name] is a 87 y.o. male.  Patient is an 87 year old male with multiple medical problems including prior stroke, dementia, diabetes, difficulty with mobility who lives in a skilled facility who is presenting today due to notable weakness in the right face arm and leg.  Patient cannot provide much history and most of it is obtained from paramedic and nurse practitioner from his facility.  Patient was last seen at his baseline last night when he went to bed.  This morning when he woke up he was noted to have slurred speech, facial droop and right-sided arm and leg weakness.  Patient has a MOST form that reports do not send him to the hospital comfort measures only however when family arrived and saw him they were concerned and requested he be sent to the hospital.  Upon arrival here patient denies having any chest pain, shortness of breath, nausea or abdominal pain.  He is not able to give any further history.  Based on EMSs report he does not have significant weakness in the right arm or leg or facial droop at baseline but did have a prior stroke where he may have had deficits on that side in the past.  The history is provided by the EMS personnel and the nursing home. The history is limited by the absence of a caregiver.      Home Medications Prior to Admission medications   Medication Sig Start Date End Date Taking? Authorizing Provider  Alcohol Swabs PADS Use to check blood sugar 3 times daily. Dx: E10.9 07/21/15   Ivar Drape D, PA  atorvastatin (LIPITOR) 40 MG tablet Take 1 tablet (40 mg total) by mouth daily. 01/28/18 02/27/18  Kendrick, Caitlyn S, PA-C  Blood Glucose Calibration (ACCU-CHEK AVIVA) SOLN Use to check blood sugar 3 times daily. Dx: E10.9 07/21/15   Ivar Drape D,  PA  Blood Glucose Monitoring Suppl (ACCU-CHEK AVIVA PLUS) w/Device KIT Use to check blood sugar 3 times daily. Dx: E10.9 09/27/15   Wardell Honour, MD  clopidogrel (PLAVIX) 75 MG tablet TAKE 1 TABLET EVERY DAY 02/21/15   Ivar Drape D, PA  fish oil-omega-3 fatty acids 1000 MG capsule Take 2 g by mouth daily.      [provider]  glipiZIDE (GLUCOTROL XL) 2.5 MG 24 hr tablet Take 1 tablet (2.5 mg total) by mouth daily with breakfast. 12/01/15   Philemon Kingdom, MD  glucagon (GLUCAGON EMERGENCY) 1 MG injection Inject 1 mg into the muscle once as needed. 09/29/15   Philemon Kingdom, MD  glucose blood (ACCU-CHEK AVIVA PLUS) test strip Use to check blood sugar 3 times daily. Dx: E10.9 11/17/15   Philemon Kingdom, MD  insulin aspart protamine- aspart (NOVOLOG MIX 70/30) (70-30) 100 UNIT/ML injection Inject 0.3-0.4 mLs (30-40 Units total) into the skin 2 (two) times daily before a meal. PENS, please! 01/28/18 02/27/18  Kendrick, Caitlyn S, PA-C  Insulin Glargine (LANTUS SOLOSTAR) 100 UNIT/ML Solostar Pen Inject 45 Units into the skin daily at 10 pm. 10/03/15   Philemon Kingdom, MD  Insulin Pen Needle 32G X 4 MM MISC Use as directed to inject insulin tidwc 12/14/12   Shawnee Knapp, MD  Ipratropium-Albuterol (COMBIVENT) 20-100 MCG/ACT AERS respimat Inhale 1 puff into the lungs every 6 (six) hours. 09/20/15  Shawnee Knapp, MD  l-methylfolate-B6-B12 [facility]) 3-35-2 MG TABS Take 1 tablet by mouth daily.      [provider]  Lancets (ACCU-CHEK SOFT TOUCH) lancets Use to check blood sugar 3 times daily. Dx: E10.9 07/21/15   Ivar Drape D, PA  lidocaine (XYLOCAINE) 5 % ointment Apply 1 application topically 4 (four) times daily as needed. Apply enough to spread across rash, but less than 6 inches of ointment. 02/02/18   Fatima Blank, MD  metFORMIN (GLUCOPHAGE) 1000 MG tablet TAKE 1 TABLET TWICE DAILY WITH MEALS (NEED MD APPOINTMENT) 09/22/15   Copland, Gay Filler, MD  Multiple  Vitamins-Minerals (MULTIVITAMIN WITH MINERALS) tablet Take 1 tablet by mouth daily.      [provider]  omeprazole (PRILOSEC) 40 MG capsule Take 1 capsule (40 mg total) by mouth daily. 01/28/18 02/27/18  Etter Sjogren, PA-C  Spacer/Aero-Holding Chambers DEVI Use as directed with combivent. Ok to dispense any spacer that works with the inhaler being rxed to pt. 09/20/15   Shawnee Knapp, MD  traMADol (ULTRAM) 50 MG tablet Take 50 mg by mouth. 11/14/15   [provider]  testosterone cypionate (DEPOTESTOTERONE CYPIONATE) 200 MG/ML injection Inject into the muscle every 14 (fourteen) days.  05/24/11  [provider]      Allergies    Patient has no known allergies.    Review of Systems   Review of Systems  Physical Exam Updated Vital Signs BP 101/81    Pulse 85    Temp 98.5 F (36.9 C) (Oral)    Resp 19    Ht 6' (1.829 m)    Wt 78 kg    SpO2 99%    BMI 23.32 kg/m  Physical Exam Vitals and nursing note reviewed.  Constitutional:      General: He is not in acute distress.    Appearance: He is well-developed.  HENT:     Head: Normocephalic and atraumatic.     Mouth/Throat:     Mouth: Mucous membranes are moist.  Eyes:     Conjunctiva/sclera: Conjunctivae normal.     Pupils: Pupils are equal, round, and reactive to light.  Cardiovascular:     Rate and Rhythm: Normal rate and regular rhythm.     Heart sounds: No murmur heard. Pulmonary:     Effort: Pulmonary effort is normal. No respiratory distress.     Breath sounds: Normal breath sounds. No wheezing or rales.  Abdominal:     General: There is no distension.     Palpations: Abdomen is soft.     Tenderness: There is no abdominal tenderness. There is no guarding or rebound.  Musculoskeletal:        General: No tenderness. Normal range of motion.     Cervical back: Normal range of motion and neck supple.     Right lower leg: No edema.     Left lower leg: No edema.  Skin:    General: Skin is warm and  dry.     Findings: No erythema or rash.  Neurological:     Mental Status: He is alert.     Comments: Oriented to person.  Slurred speech noted.  Dense hemiparesis in the right arm and leg.  No pronator drift noted in the left arm or leg.  Right-sided facial droop.  No notable dysarthria however patient does not say much.  Psychiatric:        Behavior: Behavior normal.    ED Results / Procedures / Treatments  Labs (all labs ordered are listed, but only abnormal results are displayed) Labs Reviewed  CBC WITH DIFFERENTIAL/PLATELET - Abnormal; Notable for the following components:      Result Value   WBC 13.2 (*)    RBC 3.52 (*)    Hemoglobin 10.1 (*)    HCT 31.5 (*)    RDW 15.7 (*)    Lymphs Abs 4.8 (*)    Monocytes Absolute 1.1 (*)    Eosinophils Absolute 0.7 (*)    Basophils Absolute 0.2 (*)    Abs Immature Granulocytes 0.08 (*)    All other components within normal limits  BASIC METABOLIC PANEL - Abnormal; Notable for the following components:   Sodium 133 (*)    Glucose, Bld 108 (*)    BUN 28 (*)    Creatinine, Ser 1.83 (*)    Calcium 8.7 (*)    GFR, Estimated 36 (*)    All other components within normal limits  CBG MONITORING, ED - Abnormal; Notable for the following components:   Glucose-Capillary 109 (*)    All other components within normal limits  CBG MONITORING, ED - Abnormal; Notable for the following components:   Glucose-Capillary 330 (*)    All other components within normal limits  RESP PANEL BY RT-PCR (FLU A&B, COVID) ARPGX2  APTT  PROTIME-INR  URINALYSIS, ROUTINE W REFLEX MICROSCOPIC    EKG EKG Interpretation  Date/Time:  Sunday May 21 2021 17:57:23 EST Ventricular Rate:  89 PR Interval:  130 QRS Duration: 82 QT Interval:  373 QTC Calculation: 454 R Axis:   18 Text Interpretation: Sinus rhythm Probable left atrial enlargement No significant change since last tracing Confirmed by Blanchie Dessert [phone]) on 05/21/2021 6:07:04  PM  Radiology CT Head Wo Contrast  Result Date: 05/21/2021 CLINICAL DATA:  Six Neuro deficit, acute, stroke suspected. Right side facial droop, right arm and leg weakness EXAM: CT HEAD WITHOUT CONTRAST TECHNIQUE: Contiguous axial images were obtained from the base of the skull through the vertex without intravenous contrast. RADIATION DOSE REDUCTION: This exam was performed according to the departmental dose-optimization program which includes automated exposure control, adjustment of the mA and/or kV according to patient size and/or use of iterative reconstruction technique. COMPARISON:  None. FINDINGS: Brain: There is atrophy and chronic small vessel disease changes. Old bilateral cerebellar infarcts. No acute intracranial abnormality. Specifically, no hemorrhage, hydrocephalus, mass lesion, acute infarction, or significant intracranial injury. Vascular: No hyperdense vessel or unexpected calcification. Skull: No acute calvarial abnormality. Sinuses/Orbits: Mucosal thickening throughout the paranasal sinuses. Small air-fluid levels in the maxillary sinuses bilaterally. Other: None IMPRESSION: Old bilateral cerebellar infarcts. Atrophy, chronic microvascular disease. No acute intracranial abnormality. Electronically Signed   By: Rolm Baptise M.D.   On: 05/21/2021 19:15   DG Chest Port 1 View  Result Date: 05/21/2021 CLINICAL DATA:  Weakness.  Facial droop. EXAM: PORTABLE CHEST 1 VIEW COMPARISON:  08/31/2018. FINDINGS: Cardiac silhouette normal in size.  No mediastinal or hilar masses. Linear opacities are noted at the left lung base consistent with atelectasis or scarring, stable. Remainder of the lungs is clear. No convincing pleural effusion. No pneumothorax. Skeletal structures are grossly intact. IMPRESSION: No active disease. Electronically Signed   By: Lajean Manes M.D.   On: 05/21/2021 19:04    Procedures Procedures    Medications Ordered in ED Medications - No data to display  ED Course/  Medical Decision Making/ A&P  Medical Decision Making Amount and/or Complexity of Data Reviewed Independent Historian: caregiver and EMS    Details: pt's Daughter who is POA External Data Reviewed: labs and notes. Labs: ordered. Decision-making details documented in ED Course. Radiology: ordered and independent interpretation performed. Decision-making details documented in ED Course. ECG/medicine tests: ordered and independent interpretation performed. Decision-making details documented in ED Course.  Risk Decision regarding hospitalization.   Patient is an 87 year old male with multiple comorbidities presenting today with dense right-sided deficits.  Patient's last known normal was last night when he went to bed.  He has had prior history of stroke but based on paramedic and nurse practitioner report he did not have chronic right-sided deficits.  Based on patient's exam concern for stroke or a bleed.  He does have a MOST form that reports he does not want hospitalization and wants comfort measures however per paramedic report family requested he be sent here.  8:00 PM Patient's daughter is now present in the room have reports that the facility did not call her today until later this afternoon and her brother called her stating that the patient had weakness on the right side arm and leg.  Daughter reports she saw him on Thursday he did not have any of this and that was not his baseline.  Discussed with her the MOST form and she reports she knew nothing about this form and was not present while it was filled out.  It appears to be dated from 2020.  She reports that she is her father's POA and he would want to be hospitalized and want this evaluated.  He does not meet code stroke criteria at this time as it is documented on his paperwork at 730 this morning they noted the deficits.  He still has right-sided facial droop, slurred speech and right upper and lower extremity  weakness.  She also reports he would want to be hospitalized if necessary.  He does not meet code stroke criteria as he has been having symptoms for over 12 hours as he woke up with them.  He is not displaying LVO criteria at this time.  8:00 PM I have independently evaluated and interpreted patient's labs, EKG and images.  His head CT does not show any signs of intracranial hemorrhage.  Radiology reports no acute findings but bilateral old cerebellar infarct.  Patient's chest x-ray is within normal limits.  EKG without acute changes.  CBC with minimal leukocytosis of 13,000, normal platelet count, BMP with a creatinine of 1.83 with the most recent creatinine being 2 years ago at 1.4.  Normal potassium.  Blood sugar in the low 100s.  The 300 recorded blood sugar GERD was on a different patient and this is not accurate.  Discussed patient's case with Dr. Cheral Marker with neurology and he recommends MRI.  Spoke with the patient's daughter and she does not know any reason why he cannot have an MRI.  Patient does not meet admission criteria.  We will admit the patient to medicine.  He will need stroke work-up.        Final Clinical Impression(s) / ED Diagnoses Final diagnoses:  Cerebrovascular accident (CVA), unspecified mechanism [facility])    Rx / DC Orders ED Discharge Orders     None         Blanchie Dessert, MD 05/21/21 2001

## 2021-05-21 NOTE — H&P (Signed)
History and Physical    Patient: Justin [name] MRN: [id number] DOA: 05/21/2021  Date of Service: the patient was seen and examined on 05/22/2021  Patient coming from: [facility]  Chief Complaint:  Chief Complaint  Patient presents with   Facial Droop    HPI:   87 year old male with past medical history of coronary artery disease, gastroesophageal reflux disease, diabetes mellitus type 2, prior stroke, hyperlipidemia, hypertension and chronic pancreatitis who presents to [facility] emergency department from [place] facility via EMS due to right-sided weakness with concerns for stroke.  Patient's last known normal was yesterday evening.  Patient awoke from sleep this morning with severe weakness of the right arm and right leg.  This was associated with right-sided facial droop.  Was no associated confusion, headache, visual changes.  Patient historically has had a MOST form documenting that patient did not wish hospitalization in the setting of acute illness however patient's daughter, POA came to visit patient earlier today and upon identifying patient's neurologic deficits requested that this be revoked and that the patient be brought to the hospital.  EMS was then contacted and promptly came to evaluate the patient.  Patient was then brought into [facility] emergency department for evaluation.  Upon evaluation in the emergency department noncontrast CT scan of the head revealed old bilateral cerebellar infarcts but no acute stroke.  Patient was confirmed to have persisting neurologic deficits.  ER provider discussed case with Dr. Cheral Marker with neurology who agreed to come see patient in consultation.  325 mg of aspirin was ordered and administered.  Hospitalist group was then called to assess the patient for admission to the hospital.  Review of Systems: Review of Systems  Neurological:  Positive for focal weakness.  All other systems  reviewed and are negative.   Past Medical History:  Diagnosis Date   Arthritis    Diabetes mellitus    HTN (hypertension)    Hypercholesteremia    Iron deficiency    Pancreatitis, acute 06/02/2012   Diagnosed 05/2012    Personal history of colonic adenomas 04/15/2013   04/15/2013 2 diminutive polyps     Stroke [facility])    TIA (transient ischemic attack)     Past Surgical History:  Procedure Laterality Date   ARTERY BIOPSY  02/26/2011   Procedure: BIOPSY TEMPORAL ARTERY;  Surgeon: Willey Blade, MD;  Location: [place];  Service: General;  Laterality: Left;  Left temporal artery biospy   Aldrich / LIGATION  02/26/2011   left side   UPPER GASTROINTESTINAL ENDOSCOPY      Social History:  reports that he quit smoking about 31 years ago. His smoking use included cigarettes. He has never used smokeless tobacco. He reports that he does not drink alcohol and does not use drugs.  No Known Allergies  Family History  Problem Relation Age of Onset   Heart disease Mother    Amblyopia Father    Diabetes Sister        x 2   Heart attack Sister     Prior to Admission medications   Medication Sig Start Date End Date Taking? Authorizing Provider  Alcohol Swabs PADS Use to check blood sugar 3 times daily. Dx: E10.9 07/21/15   Ivar Drape D, PA  atorvastatin (LIPITOR) 40  MG tablet Take 1 tablet (40 mg total) by mouth daily. 01/28/18 02/27/18  Kendrick, Caitlyn S, PA-C  Blood Glucose Calibration (ACCU-CHEK AVIVA) SOLN Use to check blood sugar 3 times daily. Dx: E10.9 07/21/15   Ivar Drape D, PA  Blood Glucose Monitoring Suppl (ACCU-CHEK AVIVA PLUS) w/Device KIT Use to check blood sugar 3 times daily. Dx: E10.9 09/27/15   Wardell Honour, MD  clopidogrel (PLAVIX) 75 MG tablet TAKE 1 TABLET EVERY DAY 02/21/15   Ivar Drape D, PA  fish oil-omega-3 fatty  acids 1000 MG capsule Take 2 g by mouth daily.      [provider]  glipiZIDE (GLUCOTROL XL) 2.5 MG 24 hr tablet Take 1 tablet (2.5 mg total) by mouth daily with breakfast. 12/01/15   Philemon Kingdom, MD  glucagon (GLUCAGON EMERGENCY) 1 MG injection Inject 1 mg into the muscle once as needed. 09/29/15   Philemon Kingdom, MD  glucose blood (ACCU-CHEK AVIVA PLUS) test strip Use to check blood sugar 3 times daily. Dx: E10.9 11/17/15   Philemon Kingdom, MD  insulin aspart protamine- aspart (NOVOLOG MIX 70/30) (70-30) 100 UNIT/ML injection Inject 0.3-0.4 mLs (30-40 Units total) into the skin 2 (two) times daily before a meal. PENS, please! 01/28/18 02/27/18  Kendrick, Caitlyn S, PA-C  Insulin Glargine (LANTUS SOLOSTAR) 100 UNIT/ML Solostar Pen Inject 45 Units into the skin daily at 10 pm. 10/03/15   Philemon Kingdom, MD  Insulin Pen Needle 32G X 4 MM MISC Use as directed to inject insulin tidwc 12/14/12   Shawnee Knapp, MD  Ipratropium-Albuterol (COMBIVENT) 20-100 MCG/ACT AERS respimat Inhale 1 puff into the lungs every 6 (six) hours. 09/20/15   Shawnee Knapp, MD  l-methylfolate-B6-B12 [facility]) 3-35-2 MG TABS Take 1 tablet by mouth daily.      [provider]  Lancets (ACCU-CHEK SOFT TOUCH) lancets Use to check blood sugar 3 times daily. Dx: E10.9 07/21/15   Ivar Drape D, PA  lidocaine (XYLOCAINE) 5 % ointment Apply 1 application topically 4 (four) times daily as needed. Apply enough to spread across rash, but less than 6 inches of ointment. 02/02/18   Fatima Blank, MD  metFORMIN (GLUCOPHAGE) 1000 MG tablet TAKE 1 TABLET TWICE DAILY WITH MEALS (NEED MD APPOINTMENT) 09/22/15   Copland, Gay Filler, MD  Multiple Vitamins-Minerals (MULTIVITAMIN WITH MINERALS) tablet Take 1 tablet by mouth daily.      [provider]  omeprazole (PRILOSEC) 40 MG capsule Take 1 capsule (40 mg total) by mouth daily. 01/28/18 02/27/18  Etter Sjogren, PA-C  Spacer/Aero-Holding Chambers DEVI  Use as directed with combivent. Ok to dispense any spacer that works with the inhaler being rxed to pt. 09/20/15   Shawnee Knapp, MD  traMADol (ULTRAM) 50 MG tablet Take 50 mg by mouth. 11/14/15   [provider]  testosterone cypionate (DEPOTESTOTERONE CYPIONATE) 200 MG/ML injection Inject into the muscle every 14 (fourteen) days.  05/24/11  [provider]    Physical Exam:  Vitals:   05/21/21 1715 05/21/21 1815 05/21/21 1845 05/21/21 1949  BP:  136/78 127/69 101/81  Pulse:  83 86 85  Resp: _0 Temp:      TempSrc:      SpO2:  98% 98% 99%  Weight:      Height:        Constitutional: Awake alert and oriented x 2, patient is not in any acute distress. Skin: no rashes, no lesions, poor skin turgor noted. Eyes: Pupils are  equally reactive to light.  No evidence of scleral icterus or conjunctival pallor.  ENMT: Slightly dry mucous membranes noted.  Posterior pharynx clear of any exudate or lesions.   Neck: normal, supple, no masses, no thyromegaly.  No evidence of jugular venous distension.   Respiratory: clear to auscultation bilaterally, no wheezing, no crackles. Normal respiratory effort. No accessory muscle use.  Cardiovascular: Regular rate and rhythm, no murmurs / rubs / gallops. No extremity edema. 2+ pedal pulses. No carotid bruits.  Chest:   Nontender without crepitus or deformity.   Back:   Nontender without crepitus or deformity. Abdomen: Abdomen is soft and nontender.  No evidence of intra-abdominal masses.  Positive bowel sounds noted in all quadrants.   Musculoskeletal: No joint deformity upper and lower extremities. Good ROM, no contractures. Normal muscle tone.  Neurologic: Patient is awake alert and oriented x2.  Patient exhibits right facial droop.  Patient exhibits dense weakness of the proximal and distal muscle groups of the right upper extremity.  Patient does exhibit 3 out of 5 strength of the right lower extremity.  Sensation is seemingly intact.    Patient is following all commands.  Patient is responsive to verbal stimuli.   Psychiatric: Patient exhibits depressed mood with flat affect.  Patient seems to possess insight as to their current situation.    Data Reviewed:  I have personally reviewed and interpreted labs, imaging.  Significant findings are:  Chest x-ray personally reviewed revealing no evidence of acute cardiopulmonary disease. CBC reveals leukocytosis of 13.2. COVID-19 PCR testing found to be positive. Noncontrast CT imaging of the head found to be unremarkable.  EKG: Personally reviewed.  Rhythm is normal sinus rhythm with heart rate of 89 bpm.  No dynamic ST segment changes appreciated.   Assessment and Plan: * Acute ischemic stroke [facility])- (present on admission) Patient presenting with new onset right-sided weakness and right facial droop concerning for acute ischemic stroke Noncontrast CT imaging unremarkable. Unclear onset of symptoms and therefore tPA was not administered Performing serial neurologic checks Monitoring patient on telemetry Initiating antiplatelet therapy including aspirin Home regimen of daily statin therapy to be continued  Further imaging to include: MRI of the head/neck, MRI brain Obtaining hemoglobin A1c and lipid panel in the morning Echocardiogram in the morning PT, OT, SLP evaluation Permissive hypertension with as needed antihypertensives only to be given if blood pressure greater than 220/115 Neurology following in consultation.   COVID-19 virus infection- (present on admission) Incidental finding with COVID-19 PCR being positive Patient exhibiting no evidence of shortness of breath, hypoxia or infiltrates on chest x-ray Considering patient is at high risk of disease progression, initiating Paxlovid No indication for steroids at this time Airborne and contact isolation As needed antitussives initiated As needed bronchodilators initiated Zinc and vitamin C  supplementation   Type 2 diabetes mellitus with stage 3b chronic kidney disease, with long-term current use of insulin ([place]) Patient been placed on Accu-Cheks before every meal and nightly with sliding scale insulin Reducing home regimen of basal bolus insulin therapy by 50% considering tenuous oral intake.  This can be uptitrated as necessary to achieve glycemic control Hemoglobin A1C ordered Diabetic Diet   Chronic kidney disease, stage 3b ([place])- (present on admission) Strict intake and output monitoring Creatinine seems to have risen to 1.83, up from 1.362 years ago however I presume this is progression of chronic kidney disease and not acute kidney injury. Minimizing nephrotoxic agents as much as possible Serial chemistries to monitor renal function and  electrolytes   Essential hypertension- (present on admission) Permissive hypertension per neurology recommendations Providing patient with as needed intravenous antibiotics for markedly elevated blood pressures of greater than 220/115.  Leukocytosis- (present on admission) Other than incidental COVID-19, patient has no clinical evidence of infection Chest x-ray reveals no evidence of acute infiltrate Urinalysis pending Patient is afebrile  Coronary artery disease- (present on admission) Patient is currently chest pain free Monitoring patient on telemetry Continue home regimen of antiplatelet therapy, lipid lowering therapy    Mixed diabetic hyperlipidemia associated with type 2 diabetes mellitus ([place])- (present on admission) Continuing home regimen of lipid lowering therapy.   Polyneuropathy due to type 2 diabetes mellitus ([place])- (present on admission) Continue home regimen of gabapentin  Chronic pancreatitis ([place])- (present on admission) Continue home regimen of pancreatic supplements  Goals of care, counseling/discussion All patient historically has had a MOST form that mentions that patient should not be hospitalized  for illnesses patient's daughter, who has medical power of attorney states that this is not valid and that she wishes for her father to be admitted for this acute stroke and receive all appropriate medical care She does confirm however that he should be DNR/DNI        Code Status:  DNR  code status decision has been confirmed with: Daughter and patient Family Communication: Daughter is at bedside who possesses medical power of attorney and has been updated on plan of care.  Consults: Dr. Cheral Marker with Neurology.   Severity of Illness:  The appropriate patient status for this patient is INPATIENT. Inpatient status is judged to be reasonable and necessary in order to provide the required intensity of service to ensure the patient's safety. The patient's presenting symptoms, physical exam findings, and initial radiographic and laboratory data in the context of their chronic comorbidities is felt to place them at high risk for further clinical deterioration. Furthermore, it is not anticipated that the patient will be medically stable for discharge from the hospital within 2 midnights of admission.   * I certify that at the point of admission it is my clinical judgment that the patient will require inpatient hospital care spanning beyond 2 midnights from the point of admission due to high intensity of service, high risk for further deterioration and high frequency of surveillance required.*  Author:  Vernelle Emerald MD  05/22/2021 12:24 AM

## 2021-05-21 NOTE — Progress Notes (Signed)
Pt would not keep head down in camera and tried rolling on his left side inside MRI machine. Unable to safely scan pt at this time. Pt says he understands, but then he does not follow repeated commands to hold still. Sent back to ED.

## 2021-05-21 NOTE — ED Notes (Signed)
Pt to mri 

## 2021-05-22 ENCOUNTER — Inpatient Hospital Stay (HOSPITAL_COMMUNITY): Payer: Medicare Other

## 2021-05-22 DIAGNOSIS — I6389 Other cerebral infarction: Secondary | ICD-10-CM

## 2021-05-22 LAB — COMPREHENSIVE METABOLIC PANEL
ALT: 10 U/L (ref 0–44)
ALT: 10 U/L (ref 0–44)
AST: 19 U/L (ref 15–41)
AST: 18 U/L (ref 15–41)
Albumin: 2.5 g/dL — ABNORMAL LOW (ref 3.5–5.0)
Albumin: 2.4 g/dL — ABNORMAL LOW (ref 3.5–5.0)
Alkaline Phosphatase: 73 U/L (ref 38–126)
Alkaline Phosphatase: 69 U/L (ref 38–126)
Anion gap: 8 (ref 5–15)
Anion gap: 9 (ref 5–15)
BUN: 28 mg/dL — ABNORMAL HIGH (ref 8–23)
BUN: 24 mg/dL — ABNORMAL HIGH (ref 8–23)
CO2: 20 mmol/L — ABNORMAL LOW (ref 22–32)
CO2: 20 mmol/L — ABNORMAL LOW (ref 22–32)
Calcium: 8.6 mg/dL — ABNORMAL LOW (ref 8.9–10.3)
Calcium: 8.4 mg/dL — ABNORMAL LOW (ref 8.9–10.3)
Chloride: 106 mmol/L (ref 98–111)
Chloride: 103 mmol/L (ref 98–111)
Creatinine, Ser: 1.82 mg/dL — ABNORMAL HIGH (ref 0.61–1.24)
Creatinine, Ser: 1.69 mg/dL — ABNORMAL HIGH (ref 0.61–1.24)
GFR, Estimated: 36 mL/min — ABNORMAL LOW (ref >60)
GFR, Estimated: 40 mL/min — ABNORMAL LOW (ref >60)
Glucose, Bld: 118 mg/dL — ABNORMAL HIGH (ref 70–99)
Glucose, Bld: 213 mg/dL — ABNORMAL HIGH (ref 70–99)
Potassium: 4.5 mmol/L (ref 3.5–5.1)
Potassium: 4 mmol/L (ref 3.5–5.1)
Sodium: 134 mmol/L — ABNORMAL LOW (ref 135–145)
Sodium: 132 mmol/L — ABNORMAL LOW (ref 135–145)
Total Bilirubin: 0.4 mg/dL (ref 0.3–1.2)
Total Bilirubin: 0.5 mg/dL (ref 0.3–1.2)
Total Protein: 8.7 g/dL — ABNORMAL HIGH (ref 6.5–8.1)
Total Protein: 7.9 g/dL (ref 6.5–8.1)

## 2021-05-22 LAB — HEMOGLOBIN A1C
Hgb A1c MFr Bld: 7.6 % — ABNORMAL HIGH (ref 4.8–5.6)
Mean Plasma Glucose: 171.42 mg/dL

## 2021-05-22 LAB — CBC WITH DIFFERENTIAL/PLATELET
Abs Immature Granulocytes: 0.06 10*3/uL (ref 0.00–0.07)
Abs Immature Granulocytes: 0.06 10*3/uL (ref 0.00–0.07)
Basophils Absolute: 0.2 10*3/uL — ABNORMAL HIGH (ref 0.0–0.1)
Basophils Absolute: 0.1 10*3/uL (ref 0.0–0.1)
Basophils Relative: 2 %
Basophils Relative: 1 %
Eosinophils Absolute: 0.5 10*3/uL (ref 0.0–0.5)
Eosinophils Absolute: 0.4 10*3/uL (ref 0.0–0.5)
Eosinophils Relative: 5 %
Eosinophils Relative: 3 %
HCT: 32.3 % — ABNORMAL LOW (ref 39.0–52.0)
HCT: 28.3 % — ABNORMAL LOW (ref 39.0–52.0)
Hemoglobin: 10.1 g/dL — ABNORMAL LOW (ref 13.0–17.0)
Hemoglobin: 9.4 g/dL — ABNORMAL LOW (ref 13.0–17.0)
Immature Granulocytes: 1 %
Immature Granulocytes: 1 %
Lymphocytes Relative: 25 %
Lymphocytes Relative: 25 %
Lymphs Abs: 2.7 10*3/uL (ref 0.7–4.0)
Lymphs Abs: 2.7 10*3/uL (ref 0.7–4.0)
MCH: 28.9 pg (ref 26.0–34.0)
MCH: 29.6 pg (ref 26.0–34.0)
MCHC: 31.3 g/dL (ref 30.0–36.0)
MCHC: 33.2 g/dL (ref 30.0–36.0)
MCV: 92.6 fL (ref 80.0–100.0)
MCV: 89 fL (ref 80.0–100.0)
Monocytes Absolute: 0.9 10*3/uL (ref 0.1–1.0)
Monocytes Absolute: 1 10*3/uL (ref 0.1–1.0)
Monocytes Relative: 9 %
Monocytes Relative: 9 %
Neutro Abs: 6.4 10*3/uL (ref 1.7–7.7)
Neutro Abs: 6.9 10*3/uL (ref 1.7–7.7)
Neutrophils Relative %: 58 %
Neutrophils Relative %: 61 %
Platelets: 347 10*3/uL (ref 150–400)
Platelets: 332 10*3/uL (ref 150–400)
RBC: 3.49 MIL/uL — ABNORMAL LOW (ref 4.22–5.81)
RBC: 3.18 MIL/uL — ABNORMAL LOW (ref 4.22–5.81)
RDW: 15.9 % — ABNORMAL HIGH (ref 11.5–15.5)
RDW: 15.4 % (ref 11.5–15.5)
WBC: 10.8 10*3/uL — ABNORMAL HIGH (ref 4.0–10.5)
WBC: 11.1 10*3/uL — ABNORMAL HIGH (ref 4.0–10.5)
nRBC: 0 % (ref 0.0–0.2)
nRBC: 0 % (ref 0.0–0.2)

## 2021-05-22 LAB — LIPID PANEL
Cholesterol: 138 mg/dL (ref 0–200)
HDL: 34 mg/dL — ABNORMAL LOW (ref >40)
LDL Cholesterol: 85 mg/dL (ref 0–99)
Total CHOL/HDL Ratio: 4.1 RATIO
Triglycerides: 97 mg/dL (ref <150)
VLDL: 19 mg/dL (ref 0–40)

## 2021-05-22 LAB — URINALYSIS, ROUTINE W REFLEX MICROSCOPIC
Bilirubin Urine: NEGATIVE
Glucose, UA: NEGATIVE mg/dL
Ketones, ur: NEGATIVE mg/dL
Nitrite: NEGATIVE
Protein, ur: 100 mg/dL — AB
Specific Gravity, Urine: 1.012 (ref 1.005–1.030)
WBC, UA: >50 WBC/hpf — ABNORMAL HIGH (ref 0–5)
pH: 7 (ref 5.0–8.0)

## 2021-05-22 LAB — GLUCOSE, CAPILLARY
Glucose-Capillary: 109 mg/dL — ABNORMAL HIGH (ref 70–99)
Glucose-Capillary: 47 mg/dL — ABNORMAL LOW (ref 70–99)
Glucose-Capillary: 48 mg/dL — ABNORMAL LOW (ref 70–99)
Glucose-Capillary: 179 mg/dL — ABNORMAL HIGH (ref 70–99)

## 2021-05-22 LAB — CBG MONITORING, ED
Glucose-Capillary: 53 mg/dL — ABNORMAL LOW (ref 70–99)
Glucose-Capillary: 157 mg/dL — ABNORMAL HIGH (ref 70–99)
Glucose-Capillary: 102 mg/dL — ABNORMAL HIGH (ref 70–99)
Glucose-Capillary: 113 mg/dL — ABNORMAL HIGH (ref 70–99)

## 2021-05-22 LAB — MAGNESIUM: Magnesium: 1.7 mg/dL (ref 1.7–2.4)

## 2021-05-22 IMAGING — MR MR MRA HEAD W/O CM
1 series · 18 of 48 positions shown · non-contrast
Comparison: CT head [DATE].

CLINICAL DATA: Neuro deficit, acute, stroke suspected



[Series 2: ax (id) · axial · 1.0mm · 0.43mm/px · z∈[+6,+87]mm · 18 of 176 slices shown]
[im 1/176]
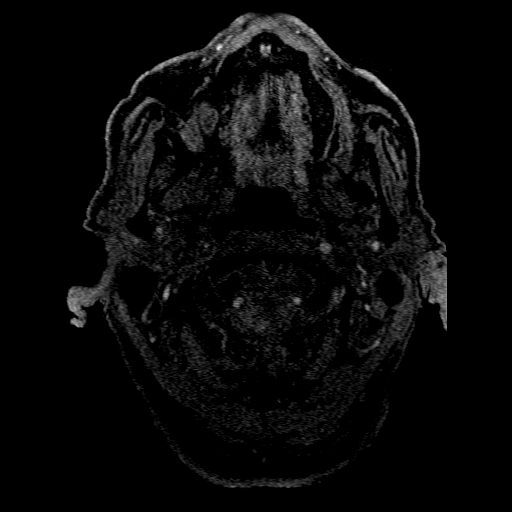
[im 4/176]
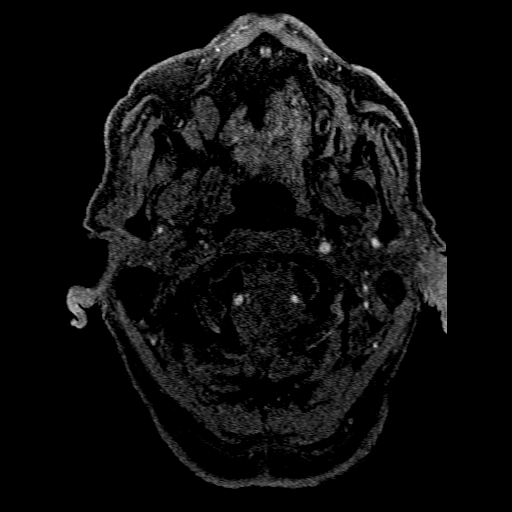
[im 8/176]
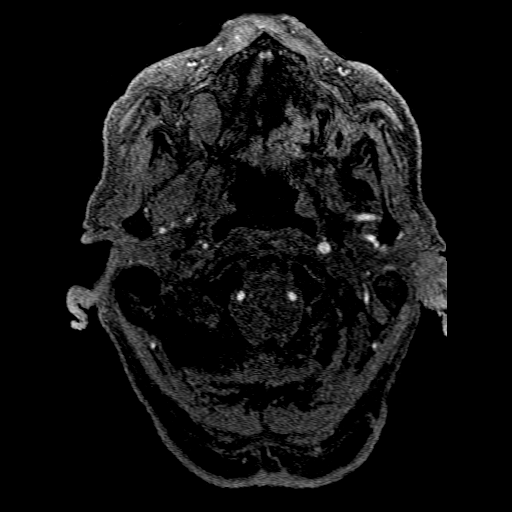
[im 12/176]
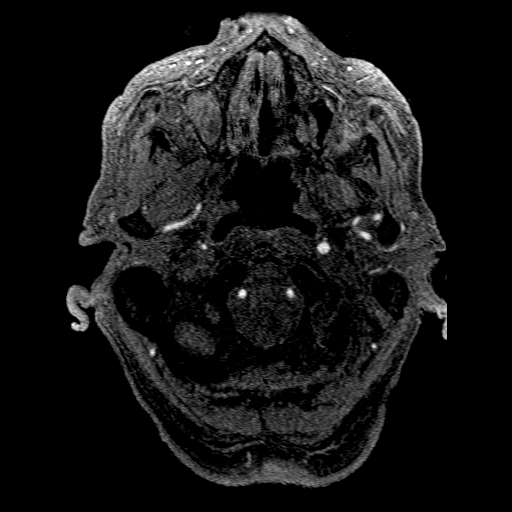
[im 15/176]
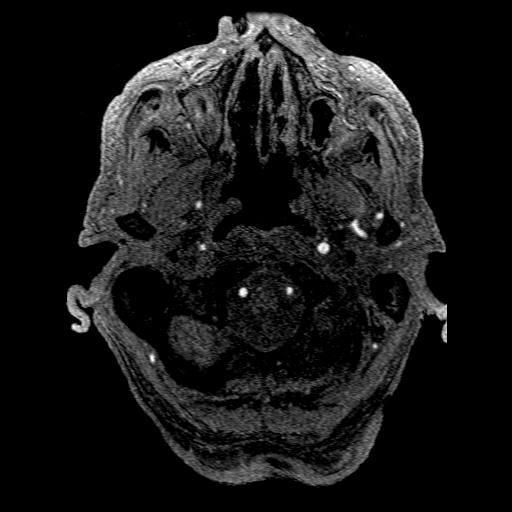
[im 19/176]
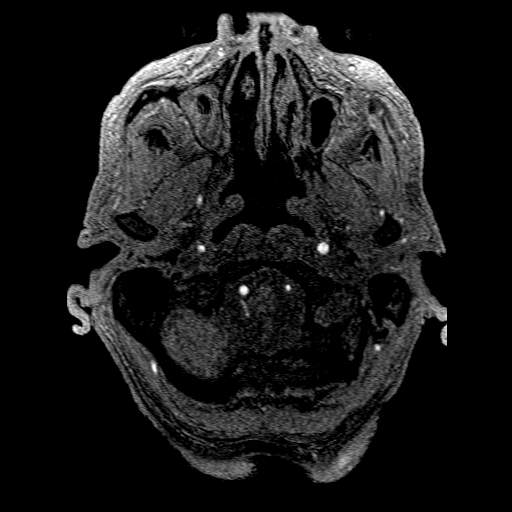
[im 23/176]
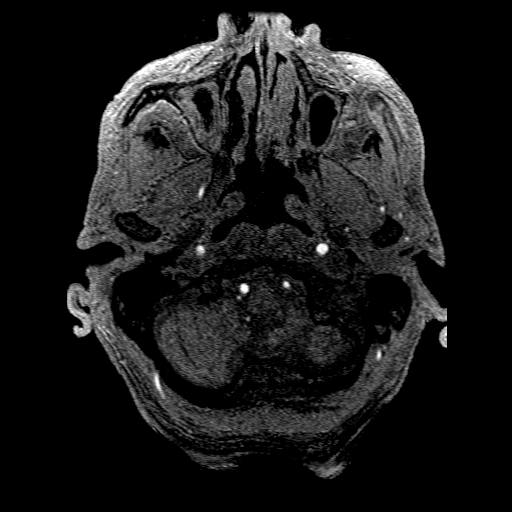
[im 27/176]
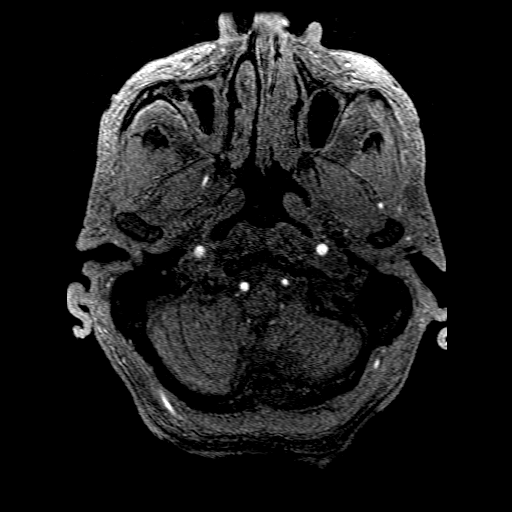
[im 30/176]
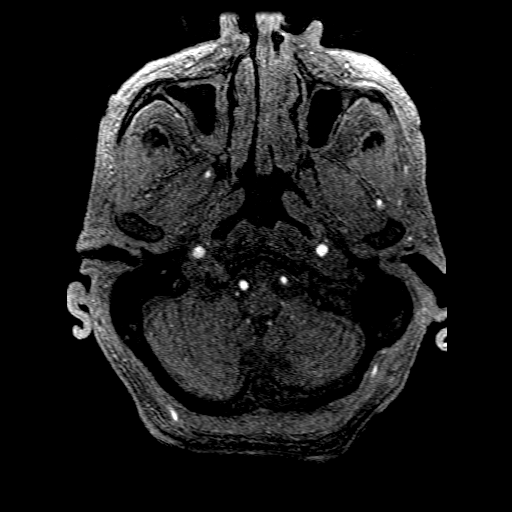
[im 34/176]
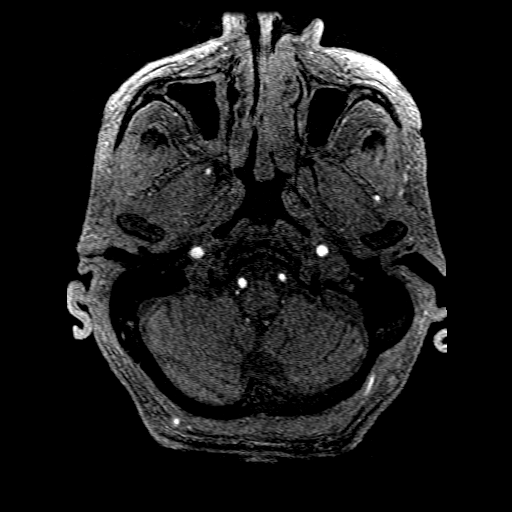
[im 56/176]
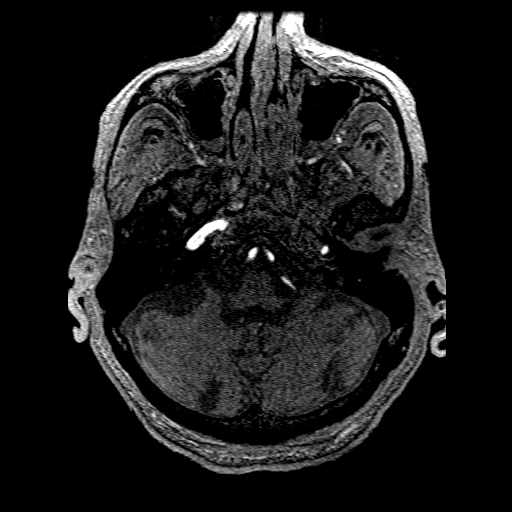
[im 79/176]
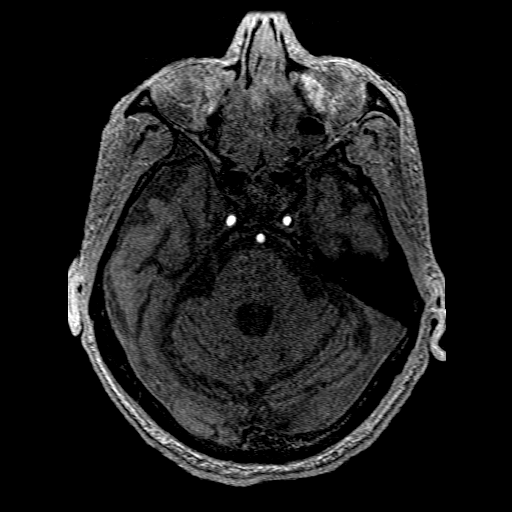
[im 90/176]
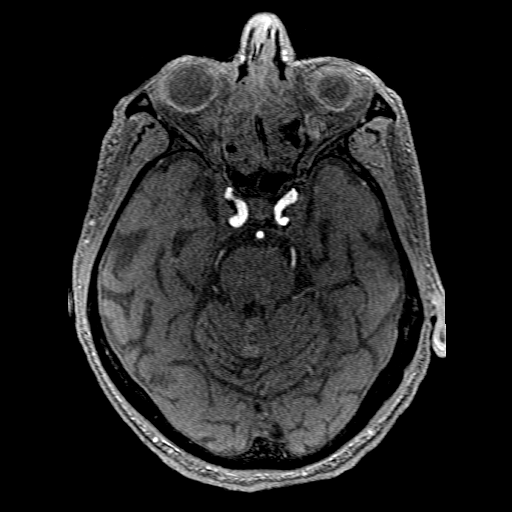
[im 101/176]
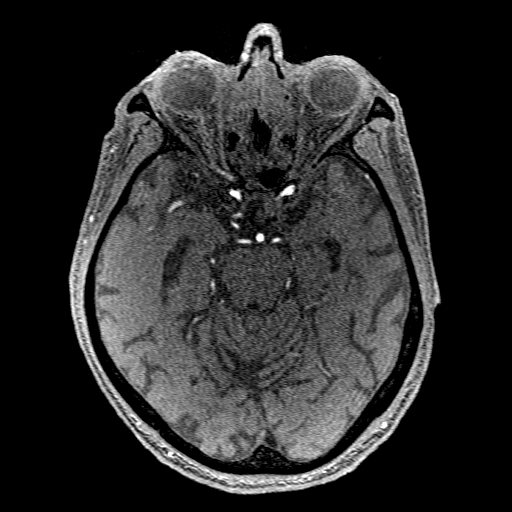
[im 123/176]
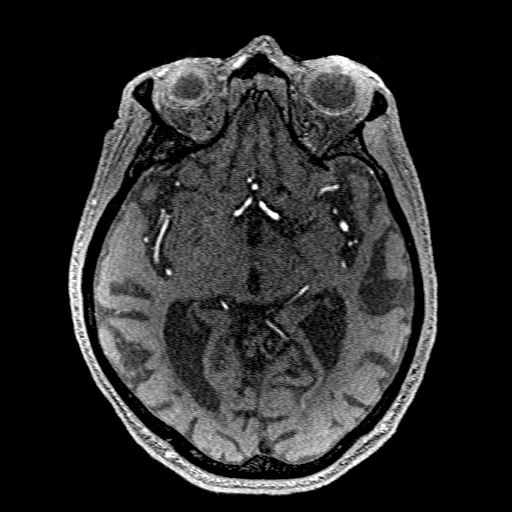
[im 146/176]
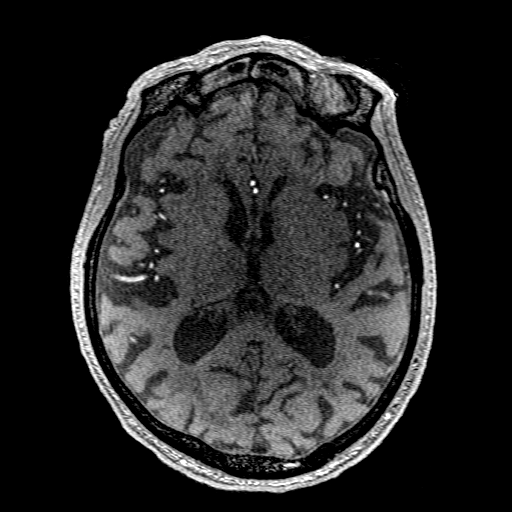
[im 149/176]
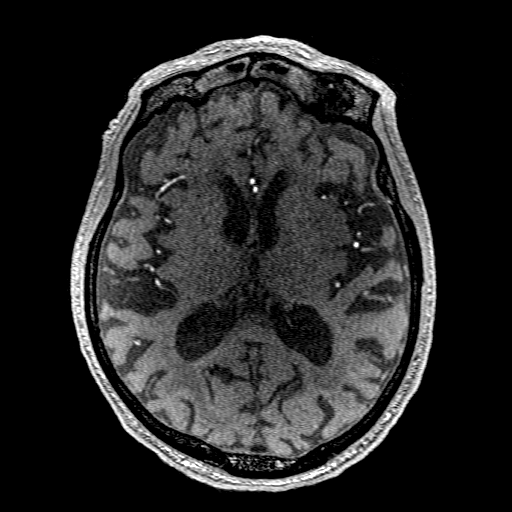
[im 168/176]
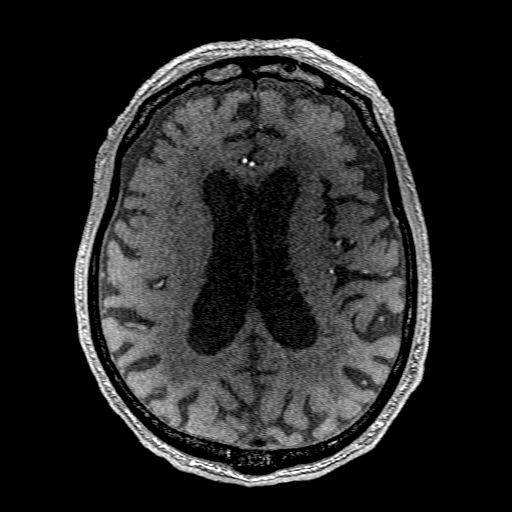

[18 of 48 positions shown; findings below may reference images not displayed]

FINDINGS: MRI HEAD FINDINGS

Moderately motion limited study.  Within this limitation:

Brain: Acute infarcts in the left perirolandic frontoparietal
cortex. Additional smaller acute infarct in the more inferior left
parietal lobe and right periventricular parietal lobe, both in white
matter. No significant mass effect. Extensive confluent and patchy
T2/FLAIR hyperintense in the white matter, nonspecific but
compatible chronic microvascular disease. Moderate atrophy with ex
vacuo ventricular dilation. No evidence of acute hemorrhage, mass
lesion, midline shift, hydrocephalus or extra-axial fluid
collection.

Vascular: See below.

Skull and upper cervical spine: Normal marrow signal.

Sinuses/Orbits: Near complete opacification of the ethmoid air cells
with moderate mucosal thickening of the frontal sinuses, maxillary
sinuses and sphenoid sinuses and air-fluid levels.

Other: No mastoid effusions.

MRA HEAD FINDINGS

Anterior circulation: Moderate narrowing of the left ICA at the
skull base. Intracranial ICAs are patent with mild right paraclinoid
ICA stenosis. Bilateral MCAs and ACAs are patent without proximal
hemodynamically significant stenosis. No definite aneurysm
identified.

Posterior circulation: Right dominant intradural vertebral artery.
Mild-to-moderate narrowing of the left intradural vertebral artery.
Basilar artery and bilateral posterior cerebral arteries are patent
without proximal hemodynamically significant stenosis. No aneurysm
identified.

MRA NECK FINDINGS

Motion limited. Great vessel origins are not imaged and there is
limited visualization of the proximal vessels. The visualized common
carotid arteries and proximal internal carotid arteries are patent
without evidence of severe significant stenosis. Moderate stenosis
of the left ICA at the skull base, better characterized on MRA head.
Bilateral vertebral arteries are patent without evidence of severe
stenosis in the neck.
IMPRESSION: MRI head:

1. Moderately motion study with acute infarcts in the left
perirolandic frontoparietal cortex and smaller acute infarcts in
bilateral parietal white matter.
2. Severe chronic microvascular ischemic disease and moderate
cerebral atrophy ([EC]-[EC]).
3. Probable sinusitis, detailed above.

MRA:

1. No large vessel occlusion.
2. Moderate stenosis of the left internal carotid artery at the
skull base.
3. Mild to moderate left intradural vertebral artery stenosis.
4. Limited evaluation in the neck without visible severe stenosis.
CTA could provide more sensitive evaluation if clinically indicated.

## 2021-05-22 IMAGING — MR MR MRA NECK W/O CM
1 series · 18 of 48 positions shown · non-contrast
Comparison: CT head [DATE].

CLINICAL DATA: Neuro deficit, acute, stroke suspected



[Series 3: TOF · axial · 2.4mm · 0.47mm/px · z∈[-174,-1]mm · 18 of 152 slices shown]
[im 1/152]
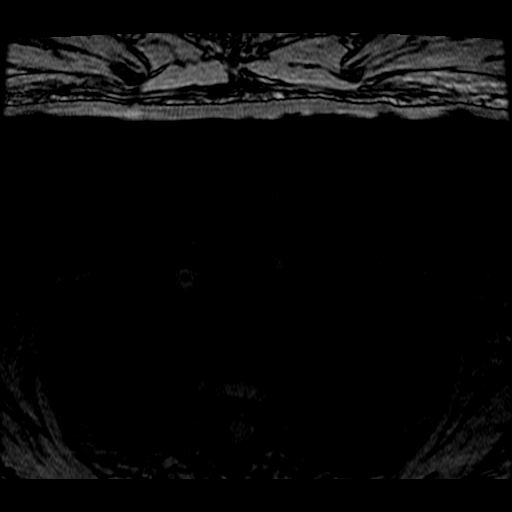
[im 4/152]
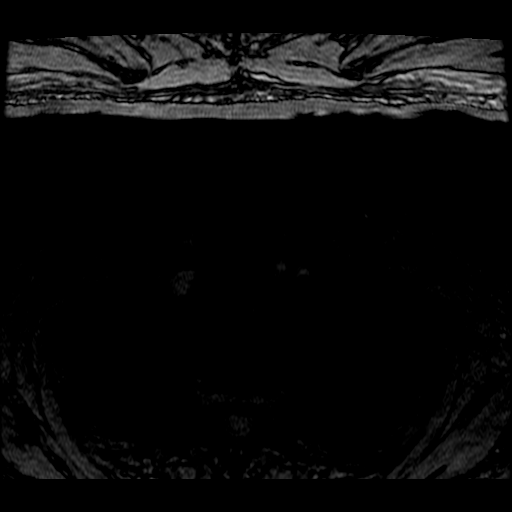
[im 7/152]
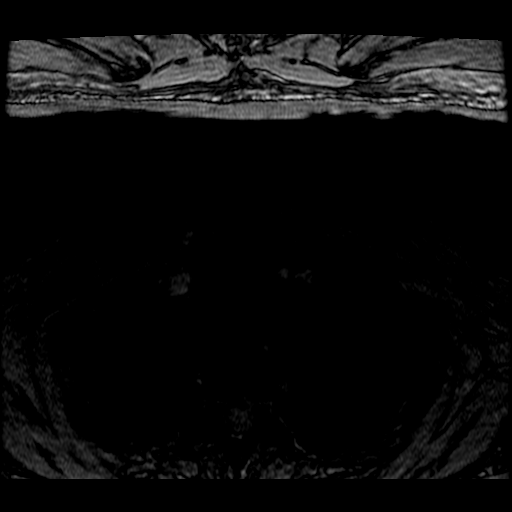
[im 10/152]
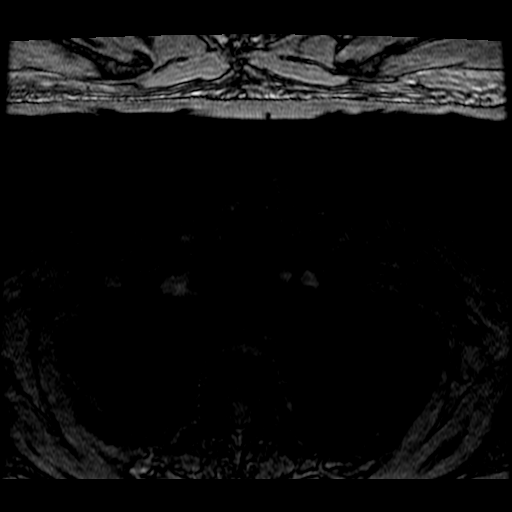
[im 13/152]
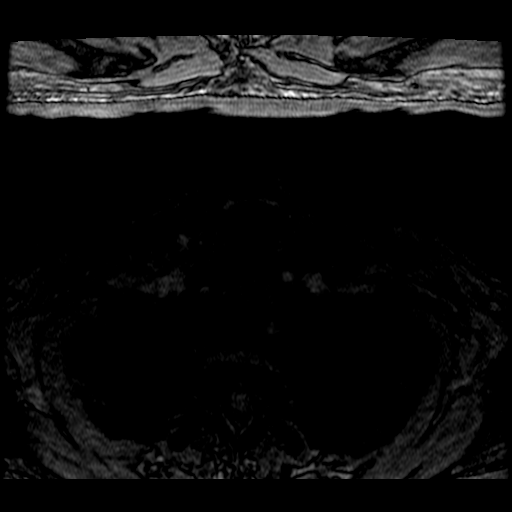
[im 17/152]
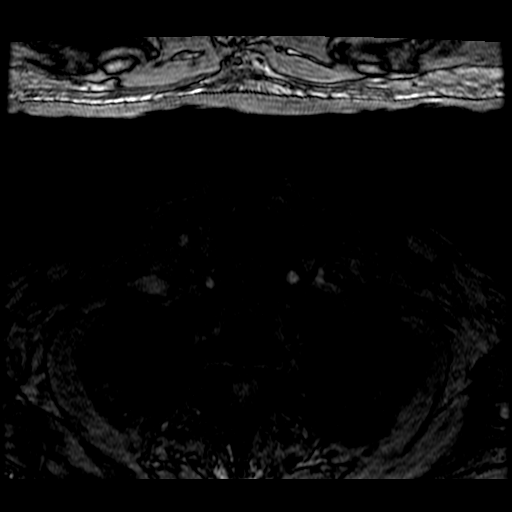
[im 20/152]
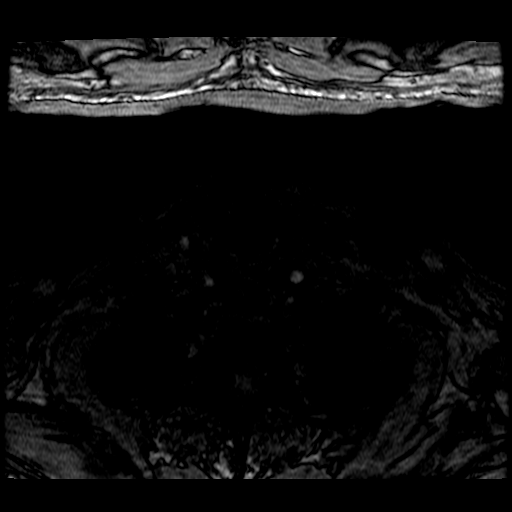
[im 23/152]
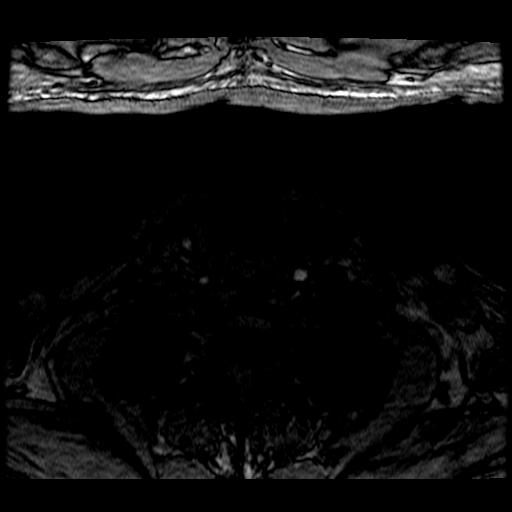
[im 26/152]
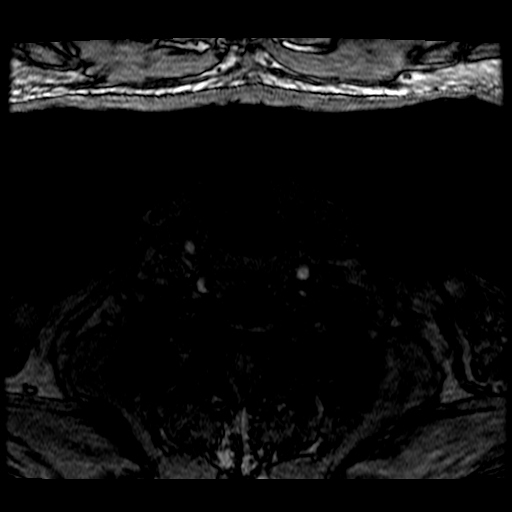
[im 29/152]
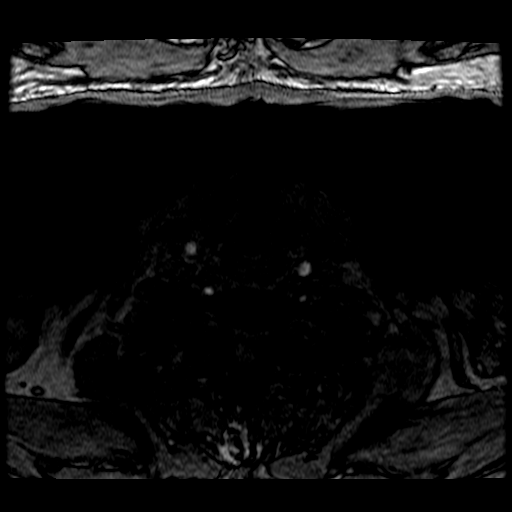
[im 49/152]
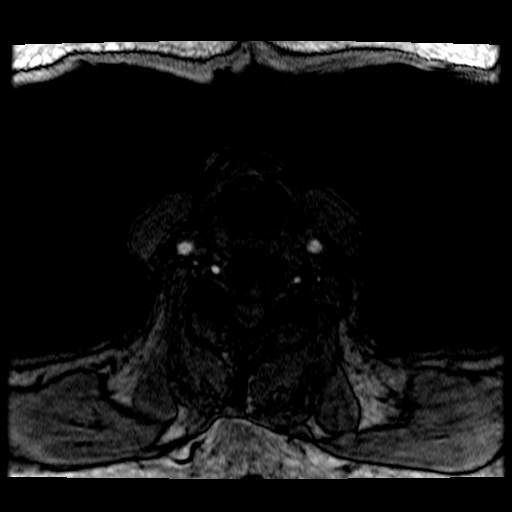
[im 68/152]
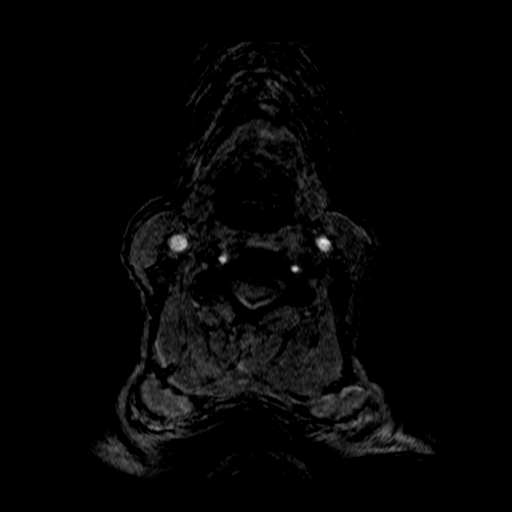
[im 78/152]
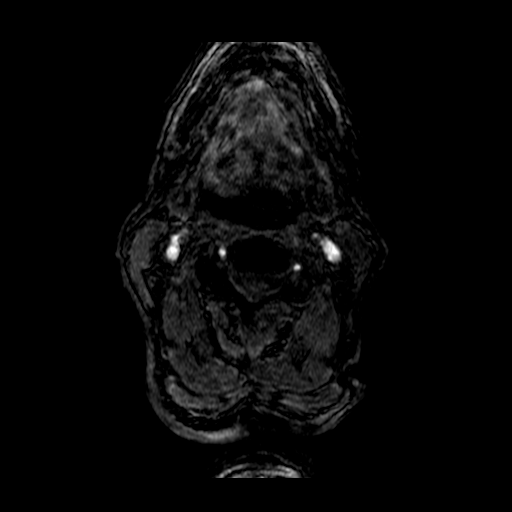
[im 87/152]
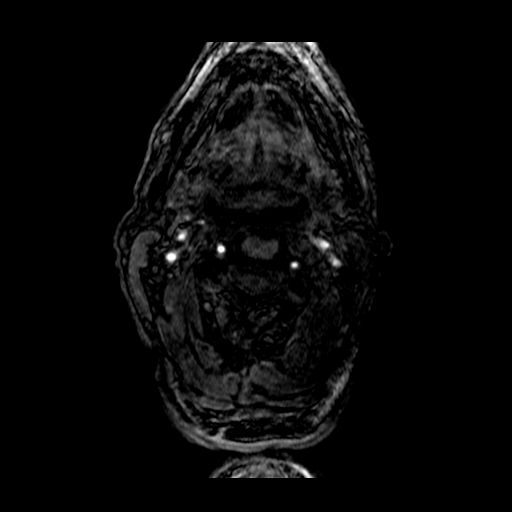
[im 107/152]
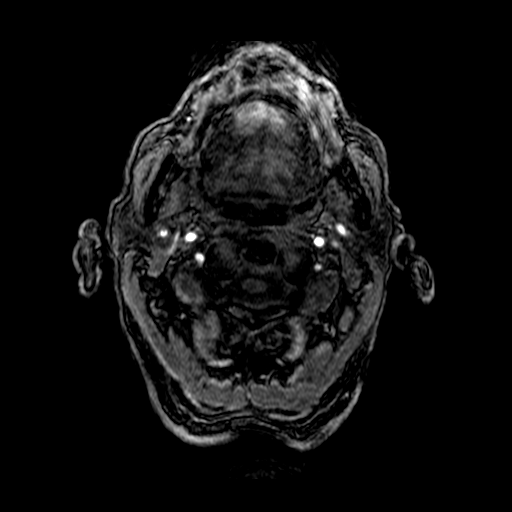
[im 126/152]
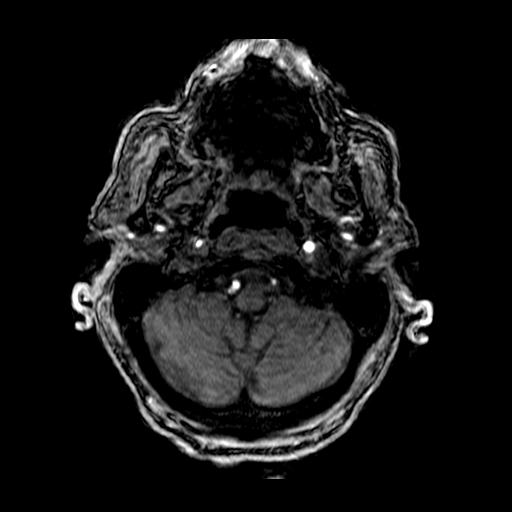
[im 129/152]
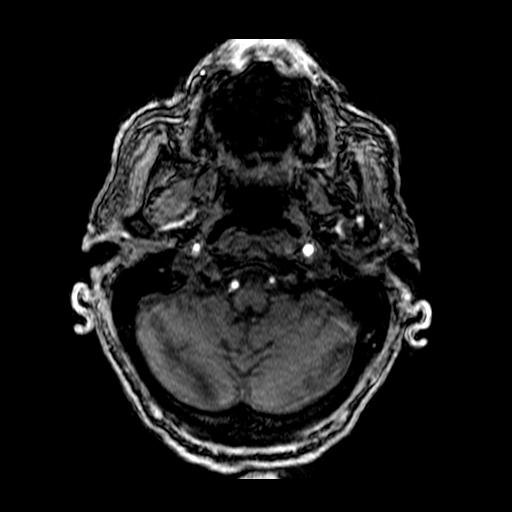
[im 145/152]
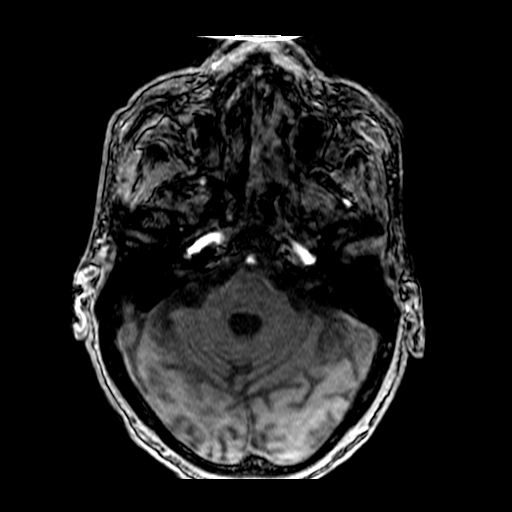

[18 of 48 positions shown; findings below may reference images not displayed]

FINDINGS: MRI HEAD FINDINGS

Moderately motion limited study.  Within this limitation:

Brain: Acute infarcts in the left perirolandic frontoparietal
cortex. Additional smaller acute infarct in the more inferior left
parietal lobe and right periventricular parietal lobe, both in white
matter. No significant mass effect. Extensive confluent and patchy
T2/FLAIR hyperintense in the white matter, nonspecific but
compatible chronic microvascular disease. Moderate atrophy with ex
vacuo ventricular dilation. No evidence of acute hemorrhage, mass
lesion, midline shift, hydrocephalus or extra-axial fluid
collection.

Vascular: See below.

Skull and upper cervical spine: Normal marrow signal.

Sinuses/Orbits: Near complete opacification of the ethmoid air cells
with moderate mucosal thickening of the frontal sinuses, maxillary
sinuses and sphenoid sinuses and air-fluid levels.

Other: No mastoid effusions.

MRA HEAD FINDINGS

Anterior circulation: Moderate narrowing of the left ICA at the
skull base. Intracranial ICAs are patent with mild right paraclinoid
ICA stenosis. Bilateral MCAs and ACAs are patent without proximal
hemodynamically significant stenosis. No definite aneurysm
identified.

Posterior circulation: Right dominant intradural vertebral artery.
Mild-to-moderate narrowing of the left intradural vertebral artery.
Basilar artery and bilateral posterior cerebral arteries are patent
without proximal hemodynamically significant stenosis. No aneurysm
identified.

MRA NECK FINDINGS

Motion limited. Great vessel origins are not imaged and there is
limited visualization of the proximal vessels. The visualized common
carotid arteries and proximal internal carotid arteries are patent
without evidence of severe significant stenosis. Moderate stenosis
of the left ICA at the skull base, better characterized on MRA head.
Bilateral vertebral arteries are patent without evidence of severe
stenosis in the neck.
IMPRESSION: MRI head:

1. Moderately motion study with acute infarcts in the left
perirolandic frontoparietal cortex and smaller acute infarcts in
bilateral parietal white matter.
2. Severe chronic microvascular ischemic disease and moderate
cerebral atrophy ([EC]-[EC]).
3. Probable sinusitis, detailed above.

MRA:

1. No large vessel occlusion.
2. Moderate stenosis of the left internal carotid artery at the
skull base.
3. Mild to moderate left intradural vertebral artery stenosis.
4. Limited evaluation in the neck without visible severe stenosis.
CTA could provide more sensitive evaluation if clinically indicated.

## 2021-05-22 MED ORDER — ATORVASTATIN CALCIUM 80 MG PO TABS: 80.0000 mg | ORAL_TABLET | Freq: Every day | ORAL | Status: DC

## 2021-05-22 MED ORDER — INSULIN ASPART 100 UNIT/ML IJ SOLN
6.0000 [IU] | Freq: Three times a day (TID) | INTRAMUSCULAR | Status: DC
  Administered 2021-05-22: 6 [IU] via SUBCUTANEOUS

## 2021-05-22 MED ORDER — INSULIN GLARGINE-YFGN 100 UNIT/ML ~~LOC~~ SOLN
20.0000 [IU] | Freq: Every day | SUBCUTANEOUS | Status: DC
  Administered 2021-05-22: 20 [IU] via SUBCUTANEOUS
  Filled 2021-05-22 (×2): qty 0.2

## 2021-05-22 MED ORDER — LORAZEPAM 2 MG/ML IJ SOLN
1.0000 mg | Freq: Once | INTRAMUSCULAR | Status: AC
  Administered 2021-05-22: 1 mg via INTRAVENOUS
  Filled 2021-05-22: qty 1

## 2021-05-22 MED ORDER — GLUCOSE 4 G PO CHEW
CHEWABLE_TABLET | ORAL | Status: AC
  Filled 2021-05-22: qty 1

## 2021-05-22 MED ORDER — DEXTROSE 50 % IV SOLN
INTRAVENOUS | Status: AC
  Administered 2021-05-22: 50 mL
  Filled 2021-05-22: qty 50

## 2021-05-22 NOTE — Assessment & Plan Note (Signed)
·   Continue home regimen of gabapentin

## 2021-05-22 NOTE — ED Notes (Signed)
Patient transported to MRI 

## 2021-05-22 NOTE — Progress Notes (Signed)
Consultation Progress Note   Patient: Justin [name] HBZ:[id number] DOB: 1934/05/29 DOA: 05/21/2021 DOS: the patient was seen and examined on 05/22/2021 Primary service: Hosie Poisson, MD  Brief hospital course: 87 year old male with past medical history of coronary artery disease, gastroesophageal reflux disease, diabetes mellitus type 2, prior stroke, hyperlipidemia, hypertension and chronic pancreatitis who presents to [facility] emergency department from [place] facility via EMS due to right-sided weakness with concerns for stroke.  Assessment and Plan: * Acute ischemic stroke [facility])- (present on admission) Patient presenting with new onset right-sided weakness and right facial droop concerning for acute ischemic stroke Noncontrast CT imaging unremarkable. Unclear onset of symptoms and therefore tPA was not administered. MRI of the brain showed Moderately motion study with acute infarcts in the left perirolandic frontoparietal cortex and smaller acute infarcts in bilateral parietal white matter Continue with aspirin and plavix.  Home regimen of daily statin therapy to be continued  MRA head and neck does not show any large vessel occlusion.  Hemoglobin A1c is 7.6. And LDL is 85.  Echocardiogram pending.  PT, OT, SLP evaluation Permissive hypertension with as needed antihypertensives only to be given if blood pressure greater than 220/115 Neurology on board and in  consultation.   COVID-19 virus infection- (present on admission) Incidental finding with COVID-19 PCR being positive Patient exhibiting no evidence of shortness of breath, hypoxia or infiltrates on chest x-ray Considering patient is at high risk of disease progression, initiating Paxlovid No indication for steroids at this time Airborne and contact isolation As needed antitussives initiated As needed bronchodilators initiated Zinc and vitamin C supplementation   Leukocytosis- (present on  admission) Other than incidental COVID-19, patient has no clinical evidence of infection Chest x-ray reveals no evidence of acute infiltrate UA shows large leukocytosis, and many bacteria. Urine cultures ordered. Would defer antibiotics till the cultures come back.  Patient is afebrile  Polyneuropathy due to type 2 diabetes mellitus ([place])- (present on admission) Continue home regimen of gabapentin  Mixed diabetic hyperlipidemia associated with type 2 diabetes mellitus ([place])- (present on admission) Hemoglobin A1c is 7.6%. CBG (last 3)  Recent Labs    05/22/21 0413 05/22/21 0831 05/22/21 1318  GLUCAP 157* 102* 113*   Resume SSI.   Chronic kidney disease, stage 3b ([place])- (present on admission) Strict intake and output monitoring Creatinine seems to have risen to 1.83, up from 1.362 years ago however I presume this is progression of chronic kidney disease and not acute kidney injury. Minimizing nephrotoxic agents as much as possible Continue to renal parameters.    Type 2 diabetes mellitus with stage 3b chronic kidney disease, with long-term current use of insulin ([place]) Patient been placed on Accu-Cheks before every meal and nightly with sliding scale insulin Reducing home regimen of basal bolus insulin therapy by 50% considering tenuous oral intake.  This can be uptitrated as necessary to achieve glycemic control Hemoglobin A1C ordered Diabetic Diet   Goals of care, counseling/discussion All patient historically has had a MOST form that mentions that patient should not be hospitalized for illnesses patient's daughter, who has medical power of attorney states that this is not valid and that she wishes for her father to be admitted for this acute stroke and receive all appropriate medical care She does confirm however that he should be DNR/DNI   Chronic pancreatitis ([place])- (present on admission) Continue home regimen of pancreatic supplements Pt denies any nausea or vomiting or  abdominal pain.   Essential hypertension- (present  on admission) Permissive hypertension per neurology recommendations Providing patient with as needed intravenous antibiotics for markedly elevated blood pressures of greater than 220/115. BP parameters are optimal.   Coronary artery disease- (present on admission) Patient is currently chest pain free Monitoring patient on telemetry Continue home regimen of antiplatelet therapy, lipid lowering therapy          Subjective: no new complaints.   Physical Exam: Vitals:   05/22/21 1100 05/22/21 1315 05/22/21 1400 05/22/21 1434  BP: 127/63 124/76 134/69   Pulse: 82 85 87   Resp: 14 14 20    Temp:    (!) 97.5 F (36.4 C)  TempSrc:    Axillary  SpO2: 97% 100% 98%   Weight:      Height:       General exam: Appears calm and comfortable  Respiratory system: Clear to auscultation. Respiratory effort normal. Cardiovascular system: S1 & S2 heard, RRR. No JVD,  No pedal edema. Gastrointestinal system: Abdomen is nondistended, soft and nontender.  Normal bowel sounds heard. Central nervous system: Alert, appears to have baseline cognitive deficits, oriented to person only.  Right sided weakness.  Extremities:  no pedal edema.  Skin: No rashes, lesions or ulcers Psychiatry: Mood & affect appropriate.    Data Reviewed: Results for orders placed or performed during the hospital encounter of 05/21/21 (from the past 24 hour(s))  CBG monitoring, ED     Status: Abnormal   Collection Time: 05/21/21  5:22 PM  Result Value Ref Range   Glucose-Capillary 109 (H) 70 - 99 mg/dL  CBG monitoring, ED     Status: Abnormal   Collection Time: 05/21/21  5:25 PM  Result Value Ref Range   Glucose-Capillary 330 (H) 70 - 99 mg/dL  CBC with Differential/Platelet     Status: Abnormal   Collection Time: 05/21/21  6:17 PM  Result Value Ref Range   WBC 13.2 (H) 4.0 - 10.5 K/uL   RBC 3.52 (L) 4.22 - 5.81 MIL/uL   Hemoglobin 10.1 (L) 13.0 - 17.0 g/dL   HCT  31.5 (L) 39.0 - 52.0 %   MCV 89.5 80.0 - 100.0 fL   MCH 28.7 26.0 - 34.0 pg   MCHC 32.1 30.0 - 36.0 g/dL   RDW 15.7 (H) 11.5 - 15.5 %   Platelets 358 150 - 400 K/uL   nRBC 0.0 0.0 - 0.2 %   Neutrophils Relative % 48 %   Neutro Abs 6.3 1.7 - 7.7 K/uL   Lymphocytes Relative 36 %   Lymphs Abs 4.8 (H) 0.7 - 4.0 K/uL   Monocytes Relative 9 %   Monocytes Absolute 1.1 (H) 0.1 - 1.0 K/uL   Eosinophils Relative 5 %   Eosinophils Absolute 0.7 (H) 0.0 - 0.5 K/uL   Basophils Relative 1 %   Basophils Absolute 0.2 (H) 0.0 - 0.1 K/uL   Immature Granulocytes 1 %   Abs Immature Granulocytes 0.08 (H) 0.00 - 0.07 K/uL  Basic metabolic panel     Status: Abnormal   Collection Time: 05/21/21  6:17 PM  Result Value Ref Range   Sodium 133 (L) 135 - 145 mmol/L   Potassium 4.1 3.5 - 5.1 mmol/L   Chloride 104 98 - 111 mmol/L   CO2 22 22 - 32 mmol/L   Glucose, Bld 108 (H) 70 - 99 mg/dL   BUN 28 (H) 8 - 23 mg/dL   Creatinine, Ser 1.83 (H) 0.61 - 1.24 mg/dL   Calcium 8.7 (L) 8.9 - 10.3 mg/dL  GFR, Estimated 36 (L) >60 mL/min   Anion gap 7 5 - 15  APTT     Status: None   Collection Time: 05/21/21  6:17 PM  Result Value Ref Range   aPTT 33 24 - 36 seconds  Protime-INR     Status: None   Collection Time: 05/21/21  6:17 PM  Result Value Ref Range   Prothrombin Time 14.2 11.4 - 15.2 seconds   INR 1.1 0.8 - 1.2  Resp Panel by RT-PCR (Flu A&B, Covid) Nasopharyngeal Swab     Status: Abnormal   Collection Time: 05/21/21  8:53 PM   Specimen: Nasopharyngeal Swab; Nasopharyngeal(NP) swabs in vial transport medium  Result Value Ref Range   SARS Coronavirus 2 by RT PCR POSITIVE (A) NEGATIVE   Influenza A by PCR NEGATIVE NEGATIVE   Influenza B by PCR NEGATIVE NEGATIVE  CBG monitoring, ED     Status: None   Collection Time: 05/21/21  9:34 PM  Result Value Ref Range   Glucose-Capillary 72 70 - 99 mg/dL  CBG monitoring, ED     Status: Abnormal   Collection Time: 05/22/21  2:41 AM  Result Value Ref Range    Glucose-Capillary 53 (L) 70 - 99 mg/dL  Hemoglobin A1c     Status: Abnormal   Collection Time: 05/22/21  3:53 AM  Result Value Ref Range   Hgb A1c MFr Bld 7.6 (H) 4.8 - 5.6 %   Mean Plasma Glucose 171.42 mg/dL  Comprehensive metabolic panel     Status: Abnormal   Collection Time: 05/22/21  3:53 AM  Result Value Ref Range   Sodium 134 (L) 135 - 145 mmol/L   Potassium 4.5 3.5 - 5.1 mmol/L   Chloride 106 98 - 111 mmol/L   CO2 20 (L) 22 - 32 mmol/L   Glucose, Bld 118 (H) 70 - 99 mg/dL   BUN 28 (H) 8 - 23 mg/dL   Creatinine, Ser 1.82 (H) 0.61 - 1.24 mg/dL   Calcium 8.6 (L) 8.9 - 10.3 mg/dL   Total Protein 8.7 (H) 6.5 - 8.1 g/dL   Albumin 2.5 (L) 3.5 - 5.0 g/dL   AST 19 15 - 41 U/L   ALT 10 0 - 44 U/L   Alkaline Phosphatase 73 38 - 126 U/L   Total Bilirubin 0.4 0.3 - 1.2 mg/dL   GFR, Estimated 36 (L) >60 mL/min   Anion gap 8 5 - 15  Magnesium     Status: None   Collection Time: 05/22/21  3:53 AM  Result Value Ref Range   Magnesium 1.7 1.7 - 2.4 mg/dL  CBC WITH DIFFERENTIAL     Status: Abnormal   Collection Time: 05/22/21  3:53 AM  Result Value Ref Range   WBC 10.8 (H) 4.0 - 10.5 K/uL   RBC 3.49 (L) 4.22 - 5.81 MIL/uL   Hemoglobin 10.1 (L) 13.0 - 17.0 g/dL   HCT 32.3 (L) 39.0 - 52.0 %   MCV 92.6 80.0 - 100.0 fL   MCH 28.9 26.0 - 34.0 pg   MCHC 31.3 30.0 - 36.0 g/dL   RDW 15.9 (H) 11.5 - 15.5 %   Platelets 347 150 - 400 K/uL   nRBC 0.0 0.0 - 0.2 %   Neutrophils Relative % 58 %   Neutro Abs 6.4 1.7 - 7.7 K/uL   Lymphocytes Relative 25 %   Lymphs Abs 2.7 0.7 - 4.0 K/uL   Monocytes Relative 9 %   Monocytes Absolute 0.9 0.1 - 1.0 K/uL  Eosinophils Relative 5 %   Eosinophils Absolute 0.5 0.0 - 0.5 K/uL   Basophils Relative 2 %   Basophils Absolute 0.2 (H) 0.0 - 0.1 K/uL   Immature Granulocytes 1 %   Abs Immature Granulocytes 0.06 0.00 - 0.07 K/uL  Lipid panel     Status: Abnormal   Collection Time: 05/22/21  3:53 AM  Result Value Ref Range   Cholesterol 138 0 - 200 mg/dL    Triglycerides 97 <150 mg/dL   HDL 34 (L) >40 mg/dL   Total CHOL/HDL Ratio 4.1 RATIO   VLDL 19 0 - 40 mg/dL   LDL Cholesterol 85 0 - 99 mg/dL  CBG monitoring, ED     Status: Abnormal   Collection Time: 05/22/21  4:13 AM  Result Value Ref Range   Glucose-Capillary 157 (H) 70 - 99 mg/dL  CBG monitoring, ED     Status: Abnormal   Collection Time: 05/22/21  8:31 AM  Result Value Ref Range   Glucose-Capillary 102 (H) 70 - 99 mg/dL  CBG monitoring, ED     Status: Abnormal   Collection Time: 05/22/21  1:18 PM  Result Value Ref Range   Glucose-Capillary 113 (H) 70 - 99 mg/dL     Family Communication: none at bedside.    Time spent: 39 minutes.   Author: Hosie Poisson, MD 05/22/2021 2:34 PM  For on call review [email or web address].

## 2021-05-22 NOTE — Progress Notes (Signed)
Echocardiogram 2D Echocardiogram has been performed.  Oneal Deputy Alean Kromer RDCS 05/22/2021, 11:29 AM

## 2021-05-22 NOTE — Assessment & Plan Note (Addendum)
•   Patient been placed on Accu-Cheks before every meal and nightly with sliding scale insulin  Reducing home regimen of basal bolus insulin therapy by 50% considering tenuous oral intake.  This can be uptitrated as necessary to achieve glycemic control  Hemoglobin A1C ordered  Diabetic Diet

## 2021-05-22 NOTE — Plan of Care (Signed)
°  Problem: Nutrition: Goal: Adequate nutrition will be maintained Outcome: Progressing   Problem: Elimination: Goal: Will not experience complications related to urinary retention Outcome: Progressing   Problem: Coping: Goal: Will identify appropriate support needs Outcome: Progressing   Problem: Self-Care: Goal: Ability to communicate needs accurately will improve Outcome: Progressing

## 2021-05-22 NOTE — Evaluation (Signed)
Physical Therapy Evaluation Patient Details Name: Justin [name] MRN: [id number] DOB: December 23, 1934 Today's Date: 05/22/2021  History of Present Illness  Pt is a 87 y/o male admitted 2/12 with R sided weakness. CT negative for acute abnormality, MRI pending. Per neurology presentation consistent with L MCA stroke.  PMH includes: CAD, DM2, prior CVA, HTN, chronic pancreatitis.  Clinical Impression  Pt admitted secondary to problem above with deficits below. Pt requiring max A +2 for bed mobility and transfers this session. Demonstrating increased weakness and decreased coordination in R extremities. Pt currently in [facility] term [facility] at [facility]. May benefit from PT follow up upon return if pt not at baseline. Will continue to follow acutely.        Recommendations for follow up therapy are one component of a multi-disciplinary discharge planning process, led by the attending physician.  Recommendations may be updated based on patient status, additional functional criteria and insurance authorization.  Follow Up Recommendations Other (comment) ([facility] with PT follow up if possible)    Assistance Recommended at Discharge Frequent or constant Supervision/Assistance  Patient can return home with the following  Two people to help with walking and/or transfers;Two people to help with bathing/dressing/bathroom    Equipment Recommendations None recommended by PT  Recommendations for Other Services       Functional Status Assessment Patient has had a recent decline in their functional status and demonstrates the ability to make significant improvements in function in a reasonable and predictable amount of time.     Precautions / Restrictions Precautions Precautions: Fall Restrictions Weight Bearing Restrictions: No      Mobility  Bed Mobility Overal bed mobility: Needs Assistance Bed Mobility: Supine to Sit, Sit to Supine     Supine to sit: Max assist, +2 for physical  assistance, +2 for safety/equipment Sit to supine: Max assist, +2 for physical assistance, +2 for safety/equipment   General bed mobility comments: assist for trunk and R LE mgmt towards EOB and return to supine    Transfers Overall transfer level: Needs assistance Equipment used: 2 person hand held assist Transfers: Sit to/from Stand Sit to Stand: Max assist, +2 physical assistance           General transfer comment: Stood X2. Requiring max A +2 for lift assist and steadying. Multimodal cues for upright posture    Ambulation/Gait                  Stairs            Wheelchair Mobility    Modified Rankin (Stroke Patients Only)       Balance Overall balance assessment: Needs assistance Sitting-balance support: No upper extremity supported, Feet supported Sitting balance-Leahy Scale: Poor Sitting balance - Comments: min- mod assist, posterior/R lateral lean Postural control: Posterior lean, Right lateral lean Standing balance support: Bilateral upper extremity supported, During functional activity Standing balance-Leahy Scale: Zero                               Pertinent Vitals/Pain Pain Assessment Pain Assessment: Faces Faces Pain Scale: No hurt    Home Living Family/patient expects to be discharged to:: Skilled nursing facility                   Additional Comments: [facility]- 2 years per daughter    Prior Function Prior Level of Function : Needs assist  Physical Assist : ADLs (physical);Mobility (physical) Mobility (physical): Transfers;Gait ADLs (physical): Bathing;Toileting;IADLs Mobility Comments: per daughter has not ambulated in 2 years, she is unsure if he was transferring without assist.  Uses WC at SNF. ADLs Comments: reports pt dresses himself     Hand Dominance   Dominant Hand: Right    Extremity/Trunk Assessment   Upper Extremity Assessment Upper Extremity Assessment: Defer to OT evaluation RUE  Deficits / Details: 0/5 MMT, flaccid. RUE Sensation: decreased light touch RUE Coordination: decreased fine motor;decreased gross motor    Lower Extremity Assessment Lower Extremity Assessment: RLE deficits/detail RLE Deficits / Details: Grossly 2/5 in knee extensors and hip flexors. Decreased coordination noted RLE Coordination: decreased gross motor    Cervical / Trunk Assessment Cervical / Trunk Assessment: Kyphotic  Communication   Communication: Expressive difficulties;HOH  Cognition Arousal/Alertness: Awake/alert Behavior During Therapy: Flat affect (labile) Overall Cognitive Status: Impaired/Different from baseline Area of Impairment: Orientation, Attention, Memory, Following commands, Safety/judgement, Awareness, Problem solving                 Orientation Level: Disoriented to, Time Current Attention Level: Focused Memory: Decreased short-term memory Following Commands: Follows one step commands inconsistently, Follows one step commands with increased time Safety/Judgement: Decreased awareness of safety, Decreased awareness of deficits Awareness: Intellectual Problem Solving: Slow processing, Decreased initiation, Difficulty sequencing, Requires verbal cues, Requires tactile cues General Comments: pt able to follow simple 1 step commands with increased time, poor awareness and problem solving.  labile during session.        General Comments General comments (skin integrity, edema, etc.): daughter present and supportive    Exercises     Assessment/Plan    PT Assessment Patient needs continued PT services  PT Problem List Decreased strength;Decreased activity tolerance;Decreased balance;Decreased mobility;Decreased knowledge of use of DME;Decreased knowledge of precautions       PT Treatment Interventions DME instruction;Gait training;Stair training;Functional mobility training;Therapeutic activities;Therapeutic exercise;Balance training;Patient/family  education    PT Goals (Current goals can be found in the Care Plan section)  Acute Rehab PT Goals Patient Stated Goal: to be independent PT Goal Formulation: With patient/family Time For Goal Achievement: 06/05/21 Potential to Achieve Goals: Good    Frequency Min 3X/week     Co-evaluation   Reason for Co-Treatment: For patient/therapist safety;To address functional/ADL transfers;Complexity of the patient's impairments (multi-system involvement);Necessary to address cognition/behavior during functional activity   OT goals addressed during session: ADL's and self-care       AM-PAC PT "6 Clicks" Mobility  Outcome Measure Help needed turning from your back to your side while in a flat bed without using bedrails?: A Lot Help needed moving from lying on your back to sitting on the side of a flat bed without using bedrails?: Total Help needed moving to and from a bed to a chair (including a wheelchair)?: Total Help needed standing up from a chair using your arms (e.g., wheelchair or bedside chair)?: Total Help needed to walk in hospital room?: Total Help needed climbing 3-5 steps with a railing? : Total 6 Click Score: 7    End of Session   Activity Tolerance: Patient tolerated treatment well Patient left: in bed;with call bell/phone within reach (on stretcher in ED) Nurse Communication: Mobility status PT Visit Diagnosis: Unsteadiness on feet (R26.81);Muscle weakness (generalized) (M62.81);Difficulty in walking, not elsewhere classified (R26.2)    Time: 3825-0539 PT Time Calculation (min) (ACUTE ONLY): 25 min   Charges:   PT Evaluation $PT Eval Moderate Complexity: 1 Mod  Reuel Derby, PT, DPT  Acute Rehabilitation Services  Pager: [phone] Office: [phone]   Rudean Hitt 05/22/2021, 1:00 PM

## 2021-05-22 NOTE — Assessment & Plan Note (Addendum)
Incidental finding with COVID-19 PCR being positive prior to arrival, no longer requires isolation or treatment. Did receive a few doses of paxlovid which has been stopped, especially in light of plavix use.

## 2021-05-22 NOTE — Assessment & Plan Note (Addendum)
·   Permissive hypertension per neurology recommendations  Providing patient with as needed intravenous antibiotics for markedly elevated blood pressures of greater than 220/115.  BP parameters are optimal.

## 2021-05-22 NOTE — Assessment & Plan Note (Addendum)
Hemoglobin A1c is 7.6%. CBG (last 3)  Recent Labs    05/22/21 0413 05/22/21 0831 05/22/21 1318  GLUCAP 157* 102* 113*   Resume SSI.

## 2021-05-22 NOTE — Assessment & Plan Note (Signed)
?   Patient is currently chest pain free ?? Monitoring patient on telemetry ?? Continue home regimen of antiplatelet therapy, lipid lowering therapy  ? ?

## 2021-05-22 NOTE — Assessment & Plan Note (Addendum)
Continue ceftriaxone with polymicrobial growth on urine culture without alternative source.

## 2021-05-22 NOTE — Assessment & Plan Note (Addendum)
·   Continue home regimen of pancreatic supplements  Pt denies any nausea or vomiting or abdominal pain.

## 2021-05-22 NOTE — Assessment & Plan Note (Addendum)
•   Strict intake and output monitoring  Creatinine seems to have risen to 1.83, up from 1.362 years ago however I presume this is progression of chronic kidney disease and not acute kidney injury.  Minimizing nephrotoxic agents as much as possible  Continue to renal parameters.

## 2021-05-22 NOTE — Evaluation (Signed)
Occupational Therapy Evaluation Patient Details Name: Justin [name] MRN: [id number] DOB: 10-08-1934 Today's Date: 05/22/2021   History of Present Illness Pt is a 87 y/o male admitted 2/12 with R sided weakness. CT negative for acute abnormality, MRI pending. Per neurology presentation consistent with L MCA stroke.  PMH includes: CAD, DM2, prior CVA, HTN, chronic pancreatitis.   Clinical Impression   Patient admitted for above and presenting with problem list below, including R sided weakness, impaired balance, decreased activity tolerance and impaired cognition. Pt is disoriented to time, follows simple commands with increased time and multimodal cueing; he presents with slow processing, poor problem solving and is labile throughout session. Pt from SNF, daughter reports he has been there for 2 years.  PTA using WC for mobility, but able to dress self.  Patient currently requires mod - total assist +2 for ADLs, max assist +2 for bed mobility and partial sit to stand from EOB.  He will benefit from continued OT services while admitted and after dc at SNF level to optimize return to PLOF and decrease burden of care.  Will follow.      Recommendations for follow up therapy are one component of a multi-disciplinary discharge planning process, led by the attending physician.  Recommendations may be updated based on patient status, additional functional criteria and insurance authorization.   Follow Up Recommendations  Skilled nursing-short term rehab (<3 hours/day)    Assistance Recommended at Discharge Frequent or constant Supervision/Assistance  Patient can return home with the following Two people to help with walking and/or transfers;Two people to help with bathing/dressing/bathroom    Functional Status Assessment  Patient has had a recent decline in their functional status and demonstrates the ability to make significant improvements in function in a reasonable and predictable amount of  time.  Equipment Recommendations  Other (comment) (TBD)    Recommendations for Other Services       Precautions / Restrictions Precautions Precautions: Fall Restrictions Weight Bearing Restrictions: No      Mobility Bed Mobility Overal bed mobility: Needs Assistance Bed Mobility: Supine to Sit, Sit to Supine     Supine to sit: Max assist, +2 for physical assistance, +2 for safety/equipment Sit to supine: Max assist, +2 for physical assistance, +2 for safety/equipment   General bed mobility comments: assist for trunk and R LE mgmt towards EOB and return to supine    Transfers                          Balance Overall balance assessment: Needs assistance Sitting-balance support: No upper extremity supported, Feet supported Sitting balance-Leahy Scale: Poor Sitting balance - Comments: min- mod assist, posterior/R lateral lean Postural control: Posterior lean, Right lateral lean Standing balance support: Bilateral upper extremity supported, During functional activity Standing balance-Leahy Scale: Zero                             ADL either performed or assessed with clinical judgement   ADL Overall ADL's : Needs assistance/impaired     Grooming: Moderate assistance;Bed level           Upper Body Dressing : Moderate assistance;Sitting   Lower Body Dressing: Total assistance;+2 for physical assistance;+2 for safety/equipment;Sitting/lateral leans;Sit to/from Health and safety inspector Details (indicate cue type and reason): unable         Functional mobility during ADLs: Maximal assistance;+2 for physical assistance;+2 for  safety/equipment       Vision   Additional Comments: brief assessment, pt able to scan to L and R. continue assessment     Perception     Praxis      Pertinent Vitals/Pain Pain Assessment Pain Assessment: Faces Faces Pain Scale: No hurt Pain Intervention(s): Monitored during session     Hand Dominance  Right   Extremity/Trunk Assessment Upper Extremity Assessment Upper Extremity Assessment: RUE deficits/detail;Generalized weakness RUE Deficits / Details: 0/5 MMT, flaccid. RUE Sensation: decreased light touch RUE Coordination: decreased fine motor;decreased gross motor   Lower Extremity Assessment Lower Extremity Assessment: Defer to PT evaluation       Communication Communication Communication: Expressive difficulties;HOH   Cognition Arousal/Alertness: Awake/alert Behavior During Therapy: Flat affect (labile) Overall Cognitive Status: Impaired/Different from baseline Area of Impairment: Orientation, Attention, Memory, Following commands, Safety/judgement, Awareness, Problem solving                 Orientation Level: Disoriented to, Time Current Attention Level: Focused Memory: Decreased short-term memory Following Commands: Follows one step commands inconsistently, Follows one step commands with increased time Safety/Judgement: Decreased awareness of safety, Decreased awareness of deficits Awareness: Intellectual Problem Solving: Slow processing, Decreased initiation, Difficulty sequencing, Requires verbal cues, Requires tactile cues General Comments: pt able to follow simple 1 step commands with increased time, poor awareness and problem solving.  labile during session.     General Comments  daughter present and supportive    Exercises     Shoulder Instructions      Home Living Family/patient expects to be discharged to:: Skilled nursing facility                                 Additional Comments: [facility]- 2 years per daughter      Prior Functioning/Environment Prior Level of Function : Needs assist       Physical Assist : ADLs (physical);Mobility (physical) Mobility (physical): Transfers;Gait ADLs (physical): Bathing;Toileting;IADLs Mobility Comments: per daughter has not ambulated in 2 years, she is unsure if he was transferring  without assist.  Uses WC at SNF. ADLs Comments: reports pt dresses himself        OT Problem List: Decreased strength;Decreased activity tolerance;Impaired balance (sitting and/or standing);Decreased range of motion;Decreased coordination;Impaired vision/perception;Decreased cognition;Decreased safety awareness;Decreased knowledge of use of DME or AE;Decreased knowledge of precautions;Impaired UE functional use;Impaired sensation;Impaired tone      OT Treatment/Interventions: Self-care/ADL training;Therapeutic exercise;DME and/or AE instruction;Therapeutic activities;Cognitive remediation/compensation;Patient/family education;Balance training;Visual/perceptual remediation/compensation;Neuromuscular education    OT Goals(Current goals can be found in the care plan section) Acute Rehab OT Goals Patient Stated Goal: none stated Time For Goal Achievement: 06/05/21 Potential to Achieve Goals: Fair  OT Frequency: Min 2X/week    Co-evaluation PT/OT/SLP Co-Evaluation/Treatment: Yes Reason for Co-Treatment: For patient/therapist safety;To address functional/ADL transfers;Complexity of the patient's impairments (multi-system involvement);Necessary to address cognition/behavior during functional activity   OT goals addressed during session: ADL's and self-care      AM-PAC OT "6 Clicks" Daily Activity     Outcome Measure Help from another person eating meals?: A Lot Help from another person taking care of personal grooming?: A Lot Help from another person toileting, which includes using toliet, bedpan, or urinal?: Total Help from another person bathing (including washing, rinsing, drying)?: A Lot Help from another person to put on and taking off regular upper body clothing?: A Lot Help from another person to put on and taking off  regular lower body clothing?: Total 6 Click Score: 10   End of Session Nurse Communication: Mobility status  Activity Tolerance: Patient tolerated treatment  well Patient left: in bed;with call bell/phone within reach;with family/visitor present  OT Visit Diagnosis: Other abnormalities of gait and mobility (R26.89);Muscle weakness (generalized) (M62.81);Hemiplegia and hemiparesis;Other symptoms and signs involving cognitive function Hemiplegia - Right/Left: Right Hemiplegia - dominant/non-dominant: Dominant Hemiplegia - caused by: Unspecified                Time: 0981-1914 OT Time Calculation (min): 21 min Charges:  OT General Charges $OT Visit: 1 Visit OT Evaluation $OT Eval Moderate Complexity: 1 Mod  Jolaine Artist, OT Acute Rehabilitation Services Pager [phone] Office [phone]   Delight Stare 05/22/2021, 10:43 AM

## 2021-05-22 NOTE — Assessment & Plan Note (Addendum)
·   All patient historically has had a MOST form that mentions that patient should not be hospitalized for illnesses. Patient's daughter who is HCPOA states that this is not valid and that she wishes for her father to be admitted for this acute stroke and receive all appropriate medical care  She does confirm however that he should be DNR/DNI

## 2021-05-22 NOTE — Progress Notes (Signed)
°  Transition of Care [facility]) Screening Note   Patient Details  Name: Justin [name] Date of Birth: October 18, 1934   Transition of [facility]) CM/SW Contact:    Pollie Friar, RN Phone Number: 05/22/2021, 2:50 PM    Transition of Care Department [facility]) has reviewed patient. We will continue to monitor patient advancement through interdisciplinary progression rounds. If new patient transition needs arise, please place a TOC consult.

## 2021-05-22 NOTE — Progress Notes (Addendum)
STROKE TEAM PROGRESS NOTE   SUBJECTIVE (INTERVAL HISTORY) His daughter, Justin [name], is at the bedside.  Overall his condition is unchanged. Daughter reports that patient had new onset of R sided weakness and facial droop as well as dysarthria from his living facility yesterday morning. He was well when he went to bed. Reports that he had a prior stroke with resolved deficits. This morning, he remains dysarthric and with weakness.   CTH with no acute ischemic or hemorrhagic changes. MRI with acute infarcts in L frontoparietal cortex and smaller acute infarcts in bilateral parietal white matter. Severe chronic microvascular ischemic disease.  OBJECTIVE CBC:  Recent Labs  Lab 05/21/21 1817 05/22/21 0353  WBC 13.2* 10.8*  NEUTROABS 6.3 6.4  HGB 10.1* 10.1*  HCT 31.5* 32.3*  MCV 89.5 92.6  PLT 358 416   Basic Metabolic Panel:  Recent Labs  Lab 05/21/21 1817 05/22/21 0353  NA 133* 134*  K 4.1 4.5  CL 104 106  CO2 22 20*  GLUCOSE 108* 118*  BUN 28* 28*  CREATININE 1.83* 1.82*  CALCIUM 8.7* 8.6*  MG  --  1.7   Lipid Panel:  Recent Labs  Lab 05/22/21 0353  CHOL 138  TRIG 97  HDL 34*  CHOLHDL 4.1  VLDL 19  LDLCALC 85   HgbA1c:  Recent Labs  Lab 05/22/21 0353  HGBA1C 7.6*   Urine Drug Screen: No results for input(s): LABOPIA, COCAINSCRNUR, LABBENZ, AMPHETMU, THCU, LABBARB in the last 168 hours.  Alcohol Level No results for input(s): ETH in the last 168 hours.  IMAGING past 24 hours DG Abd 1 View  Result Date: 05/21/2021 CLINICAL DATA:  An 87 year old male presents for MRI clearance. EXAM: ABDOMEN - 1 VIEW COMPARISON:  Abdominal radiograph from 2015. FINDINGS: EKG leads project over the chest and abdomen. Cholecystectomy clips are present in the RIGHT upper quadrant. Mild gaseous distension of the stomach. Moderate gaseous distension of colonic loops. Signs of rectal gas. No substantial small bowel dilation. No acute musculoskeletal findings to the extent evaluated. RIGHT and  LEFT flank are excluded from view slightly on today's study which does encompass most of the abdomen and the pelvis. No metallic foreign bodies aside from cholecystectomy clips are noted. IMPRESSION: 1. No metallic foreign bodies aside from cholecystectomy clips about the visualized abdomen. 2. Moderate gaseous distension of the stomach and colon. No acute findings. Electronically Signed   By: Zetta Bills M.D.   On: 05/21/2021 21:01   CT Head Wo Contrast  Result Date: 05/21/2021 CLINICAL DATA:  Six Neuro deficit, acute, stroke suspected. Right side facial droop, right arm and leg weakness EXAM: CT HEAD WITHOUT CONTRAST TECHNIQUE: Contiguous axial images were obtained from the base of the skull through the vertex without intravenous contrast. RADIATION DOSE REDUCTION: This exam was performed according to the departmental dose-optimization program which includes automated exposure control, adjustment of the mA and/or kV according to patient size and/or use of iterative reconstruction technique. COMPARISON:  None. FINDINGS: Brain: There is atrophy and chronic small vessel disease changes. Old bilateral cerebellar infarcts. No acute intracranial abnormality. Specifically, no hemorrhage, hydrocephalus, mass lesion, acute infarction, or significant intracranial injury. Vascular: No hyperdense vessel or unexpected calcification. Skull: No acute calvarial abnormality. Sinuses/Orbits: Mucosal thickening throughout the paranasal sinuses. Small air-fluid levels in the maxillary sinuses bilaterally. Other: None IMPRESSION: Old bilateral cerebellar infarcts. Atrophy, chronic microvascular disease. No acute intracranial abnormality. Electronically Signed   By: Rolm Baptise M.D.   On: 05/21/2021 19:15   DG Chest  Port 1 View  Result Date: 05/21/2021 CLINICAL DATA:  Weakness.  Facial droop. EXAM: PORTABLE CHEST 1 VIEW COMPARISON:  08/31/2018. FINDINGS: Cardiac silhouette normal in size.  No mediastinal or hilar masses.  Linear opacities are noted at the left lung base consistent with atelectasis or scarring, stable. Remainder of the lungs is clear. No convincing pleural effusion. No pneumothorax. Skeletal structures are grossly intact. IMPRESSION: No active disease. Electronically Signed   By: Lajean Manes M.D.   On: 05/21/2021 19:04     PHYSICAL EXAM Temp:  [98 F (36.7 C)-98.5 F (36.9 C)] 98 F (36.7 C) (02/13 0811) Pulse Rate:  [81-93] 81 (02/13 0730) Resp:  [12-21] 13 (02/13 0730) BP: (101-159)/(69-101) 139/79 (02/13 0730) SpO2:  [98 %-100 %] 100 % (02/13 0730) Weight:  [78 kg] 78 kg (02/12 1711)  General - Well nourished, well developed, elderly African-American male in no apparent distress.  Cardiovascular - Regular rhythm and rate on telemetry. 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 Mental Status -  Level of arousal and orientation to person was intact. Reported year as 2014 and  place as rest home. Language including expression, naming, repetition, comprehension was assessed and found dysarthric.  Nonfluent speech with occasional word finding difficulties  Cranial Nerves II - XII - II - Blinks to threat on the L and sometimes on R as well.  III, IV, VI - Extraocular movements largely intact..  V - Facial sensation intact bilaterally. VII - Facial movement with R droop at rest and when smiling.  VIII - Hearing & vestibular intact bilaterally. X - Palate elevates symmetrically. XI - Shoulder shrug intact but weak bilaterally. Head preferentially rotated left.  XII - Tongue protrusion with slight R deviation  Motor Strength - RUE flaccid tone, 0/5 RLE with weak flexion at hip and knee when asked to elevate, 2/5, however able to maintain knee bent for 5 seconds LUE 5/5 LLE 5/5  Bulk was decreased in RUE Motor Tone - Muscle tone was assessed at the neck and appendages and was normal.  Sensory - Light touch was assessed and revealed some neglect. Patient unable to recognize L leg  being touched when both extremities were touched at the same time; this is inconsistent.  Coordination - The patient had slow but normal FNF on the L; unable to complete on the R..  Tremor was absent.  Gait and Station - deferred.    ASSESSMENT/PLAN Mr. KASYN ROLPH is a 87 y.o. male with history of arthritis, DM, HTN, hypercholesterolemia, iron deficiency, prior episode of acute pancreatitis in 2014, CKD Stage IIIb, prior stroke and TIA presenting from [facility] with new onset of right facial droop, plegic RUE and paretic RLE.   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 Old bilateral cerebellar infarcts. Atrophy, chronic microvascular disease. CTA head & neck Attempted but patient restless. Will not obtain, as patient's Cr 1.82-1.83. Will instead obtain Carotid Doppler. MRI  Acute infarcts in L frontoparietal cortex and smaller acute infarcts in bilateral parietal white matter. Severe chronic microvascular ischemic disease MRA  No large vessel occlusion Carotid Doppler  Pending 2D Echo Pending LDL 85 HgbA1c 7.6 VTE prophylaxis - SQ Lovenox    Diet   Diet NPO time specified   clopidogrel 75 mg daily prior to admission, now on aspirin 81 mg daily and clopidogrel 75 mg daily. X 3 weeks and then aspirin alone Therapy recommendations:  Return to [place] with PT, SNF per OT Disposition:  Pending  Hypertension Home meds:  N/A Stable SBP goal <160 but gradually normalize in 5-7 days Long-term BP goal normotensive  Hyperlipidemia Home meds:  Atorvastatin 40 mg, resumed in hospital LDL 85, goal < 70 High intensity statin indicated; will increase to 80 mg daily Continue statin at discharge  Diabetes type II Controlled Home meds:  Glipizide 2.5 mg, Novolog 30-40u mealtime correction, Lantus 45u qHS, Metformin 1000 mg BID HgbA1c 7.6, goal < 7.0 CBGs Recent Labs     05/21/21 2134 05/22/21 0241 05/22/21 0413  GLUCAP 72 19* 157*    SSI  Other Stroke Risk Factors Advanced Age >/= 68  Former cigarette smoker; stopped 31 years ago ETOH use, alcohol level No results found for requested labs within last 26280 hours., advised to drink no more than 1-2 drink(s) a day Substance abuse - UDS:  THC No results found for requested labs within last 26280 hours., Cocaine No results found for requested labs within last 26280 hours.. Patient advised to stop using due to stroke risk. Hx stroke/TIA  Other Active Problems   Hospital day # 1    Rosezetta Schlatter, MD Stroke Neurology- Neuro Psych Resident 05/22/2021 8:23 AM  STROKE MD NOTE : I have personally obtained history,examined this patient, reviewed notes, independently viewed imaging studies, participated in medical decision making and plan of care.ROS completed by me personally and pertinent positives fully documented  I have made any additions or clarifications directly to the above note. Agree with note above.  Patient presented with dysarthria and right-sided weakness secondary to embolic large left cortical and subcortical infarct etiology likely embolic.  Patient is bilateral good long-term anticoagulation candidate given his poor baseline functioning and living in a skilled nursing facility hence we will l not consider loop recorder.  Continue ongoing stroke work-up.  Aspirin Plavix for 3 weeks followed by aspirin alone and aggressive risk factor modification.  Long discussion with patient and daughter at the bedside and answered questions.  Discussed with Dr. Karleen Hampshire.  Greater than 50% time during this 50-minute visit was spent in counseling and coordination of care about his embolic stroke and discussion about evaluation and treatment and answering questions.  Antony Contras, MD Medical Director [facility] Pager: [phone] 05/22/2021 3:06 PM    To contact Stroke Continuity provider, please  refer to [email or web address] After hours, contact General Neurology

## 2021-05-22 NOTE — Progress Notes (Signed)
Patient's daughter looking for a pt's papers with his meds information. Per daughter she left in ED on counter and upon asking staff in ED no one knows what happened to those papers, I massaged ED RN, Fayrene Fearing, RN, per nurse she had talked with daughter and nurse told her they don't know what happened with those papers.

## 2021-05-22 NOTE — ED Notes (Signed)
I was unable to give pt his 1200 meds or do modified stroke scale dueto pt still being out of it from the ativan given.

## 2021-05-23 ENCOUNTER — Inpatient Hospital Stay (HOSPITAL_COMMUNITY): Payer: Medicare Other

## 2021-05-23 ENCOUNTER — Other Ambulatory Visit (HOSPITAL_COMMUNITY): Payer: Medicare Other

## 2021-05-23 DIAGNOSIS — M6281 Muscle weakness (generalized): Secondary | ICD-10-CM

## 2021-05-23 LAB — GLUCOSE, CAPILLARY
Glucose-Capillary: 41 mg/dL — CL (ref 70–99)
Glucose-Capillary: 59 mg/dL — ABNORMAL LOW (ref 70–99)
Glucose-Capillary: 81 mg/dL (ref 70–99)
Glucose-Capillary: 125 mg/dL — ABNORMAL HIGH (ref 70–99)
Glucose-Capillary: 213 mg/dL — ABNORMAL HIGH (ref 70–99)
Glucose-Capillary: 152 mg/dL — ABNORMAL HIGH (ref 70–99)
Glucose-Capillary: 48 mg/dL — ABNORMAL LOW (ref 70–99)
Glucose-Capillary: 112 mg/dL — ABNORMAL HIGH (ref 70–99)

## 2021-05-23 LAB — BASIC METABOLIC PANEL
Anion gap: 10 (ref 5–15)
BUN: 23 mg/dL (ref 8–23)
CO2: 21 mmol/L — ABNORMAL LOW (ref 22–32)
Calcium: 9.1 mg/dL (ref 8.9–10.3)
Chloride: 103 mmol/L (ref 98–111)
Creatinine, Ser: 1.77 mg/dL — ABNORMAL HIGH (ref 0.61–1.24)
GFR, Estimated: 37 mL/min — ABNORMAL LOW (ref >60)
Glucose, Bld: 80 mg/dL (ref 70–99)
Potassium: 4 mmol/L (ref 3.5–5.1)
Sodium: 134 mmol/L — ABNORMAL LOW (ref 135–145)

## 2021-05-23 LAB — URINE CULTURE: Culture: >=100000 — AB

## 2021-05-23 MED ORDER — LORAZEPAM 2 MG/ML IJ SOLN: 0.5000 mg | Freq: Four times a day (QID) | INTRAMUSCULAR | Status: DC | PRN

## 2021-05-23 MED ORDER — HALOPERIDOL 0.5 MG PO TABS
2.0000 mg | ORAL_TABLET | Freq: Three times a day (TID) | ORAL | Status: DC | PRN
  Filled 2021-05-23: qty 4

## 2021-05-23 MED ORDER — HALOPERIDOL LACTATE 5 MG/ML IJ SOLN: 1.0000 mg | Freq: Four times a day (QID) | INTRAMUSCULAR | Status: DC | PRN

## 2021-05-23 MED ORDER — MELATONIN 3 MG PO TABS
3.0000 mg | ORAL_TABLET | Freq: Every day | ORAL | Status: DC
  Administered 2021-05-23 – 2021-05-25 (×3): 3 mg via ORAL
  Filled 2021-05-23 (×3): qty 1

## 2021-05-23 MED ORDER — HALOPERIDOL LACTATE 5 MG/ML IJ SOLN
2.0000 mg | Freq: Four times a day (QID) | INTRAMUSCULAR | Status: DC | PRN
  Administered 2021-05-23: 2 mg via INTRAVENOUS
  Filled 2021-05-23: qty 1

## 2021-05-23 MED ORDER — DEXTROSE-NACL 5-0.9 % IV SOLN: INTRAVENOUS | Status: DC

## 2021-05-23 MED ORDER — SODIUM CHLORIDE 0.9 % IV SOLN
1.0000 g | INTRAVENOUS | Status: DC
  Administered 2021-05-23 – 2021-05-24 (×2): 1 g via INTRAVENOUS
  Filled 2021-05-23 (×2): qty 10

## 2021-05-23 MED ORDER — DEXTROSE 50 % IV SOLN
INTRAVENOUS | Status: AC
  Filled 2021-05-23: qty 50

## 2021-05-23 MED ORDER — DEXTROSE 50 % IV SOLN
INTRAVENOUS | Status: AC
  Administered 2021-05-23: 50 mL
  Filled 2021-05-23: qty 50

## 2021-05-23 NOTE — Progress Notes (Addendum)
Inpatient Diabetes Program Recommendations  AACE/ADA: New Consensus Statement on Inpatient Glycemic Control (2015)  Target Ranges:  Prepandial:   less than 140 mg/dL      Peak postprandial:   less than 180 mg/dL (1-2 hours)      Critically ill patients:  140 - 180 mg/dL   Lab Results  Component Value Date   GLUCAP 125 (H) 05/23/2021   HGBA1C 7.6 (H) 05/22/2021    Review of Glycemic Control  Latest Reference Range & Units 05/22/21 08:31 05/22/21 13:18 05/22/21 17:36 05/22/21 17:52 05/22/21 18:17 05/22/21 21:05 05/23/21 04:45 05/23/21 04:57  Glucose-Capillary 70 - 99 mg/dL 102 (H) 113 (H) 47 (L) 48 (L) 109 (H) 179 (H) 41 (LL) 59 (L)  (LL): Data is critically low (H): Data is abnormally high (L): Data is abnormally low  Diabetes history: DM2 Outpatient Diabetes medications: Lantus 38 units qd, Humalog correction scale tid & hs, Trulicity 1.5 mg weekly Current orders for Inpatient glycemic control: Novolog correction 0-15 units qid  Inpatient Diabetes Program Recommendations:   Clarified with Marlane Mingle NP from [place] diabetes medications from [place] 38 units qd, Humalog correction scale tid & hs, Trulicity 1.5 mg weekly.  -Decrease Novolog correction to 0-9 units tid + hs 0-5 units  Thank you, Bethena Roys E. Danicia Terhaar, RN, MSN, CDE  Diabetes Coordinator Inpatient Glycemic Control Team Team Pager [phone] (8am-5pm) 05/23/2021 11:37 AM

## 2021-05-23 NOTE — Progress Notes (Signed)
Consultation Progress Note   Patient: Justin [name] KXF:[id number] DOB: March 23, 1935 DOA: 05/21/2021 DOS: the patient was seen and examined on 05/23/2021 Primary service: Justin Poisson, MD  Brief hospital course: 87 year old male with past medical history of coronary artery disease, gastroesophageal reflux disease, diabetes mellitus type 2, prior stroke, hyperlipidemia, hypertension and chronic pancreatitis who presents to [facility] emergency department from [place] facility via EMS due to right-sided weakness with concerns for stroke.  Assessment and Plan: * Acute ischemic stroke [facility])- (present on admission) Patient presenting with new onset right-sided weakness and right facial droop concerning for acute ischemic stroke Noncontrast CT imaging unremarkable. Unclear onset of symptoms and therefore tPA was not administered. MRI of the brain showed Moderately motion study with acute infarcts in the left perirolandic frontoparietal cortex and smaller acute infarcts in bilateral parietal white matter Continue with aspirin 81 mg  and plavix 75 mg daily  Lipitor 80  mg daily for LDL 85.  MRA head and neck does not show any large vessel occlusion.  Hemoglobin A1c is 7.6. And LDL is 85.  Echocardiogram showed LVEF 60 to 65%. The left ventricle has normal function. The left ventricle has no regional wall motion abnormalities PT, OT, SLP evaluation pending.  Permissive hypertension with as needed antihypertensives only to be given if blood pressure greater than 220/115 Neurology on board and in  consultation.   COVID-19 virus infection- (present on admission) Incidental finding with COVID-19 PCR being positive Patient exhibiting no evidence of shortness of breath, hypoxia or infiltrates on chest x-ray Considering patient is at high risk of disease progression, initiating Paxlovid No indication for steroids at this time Airborne and contact isolation, but it appears  he was tested positive last month too. Will need test results from SNF, so we can d/c the airborne isolation.  As needed antitussives initiated As needed bronchodilators initiated Zinc and vitamin C supplementation   Leukocytosis- (present on admission) Other than incidental COVID-19, patient has no clinical evidence of infection Chest x-ray reveals no evidence of acute infiltrate UA shows large leukocytosis, and many bacteria. Urine cultures ordered. Urine cultures show 1,00,000 of multiple bacterial species. Will start him on IV rocephin and repeat urine culture.  Patient is afebrile  Polyneuropathy due to type 2 diabetes mellitus (Justin [name])- (present on admission) Continue home regimen of gabapentin  Mixed diabetic hyperlipidemia associated with type 2 diabetes mellitus (Justin [name])- (present on admission) Hemoglobin A1c is 7.6%. Resume statin.  Chronic kidney disease, stage 3b (Justin [name])- (present on admission) Strict intake and output monitoring Creatinine seems to have risen to 1.83, up from 1.362 years ago however I presume this is progression of chronic kidney disease and not acute kidney injury. Minimizing nephrotoxic agents as much as possible Continue to renal parameters.  US renal ordered for to check for hydronephrosis vs medical renal disease.   Type 2 diabetes mellitus with stage 3b chronic kidney disease, with long-term current use of insulin ([name]) CBG (last 3)  Recent Labs    05/23/21 0526 05/23/21 0633 05/23/21 1208  GLUCAP 81 125* 213*   Continue with SSI.  A1c is 7.6%, he had another episode of hypoglycemia, will hold off on all insulin regimen.    Goals of care, counseling/discussion All patient historically has had a MOST form that mentions that patient should not be hospitalized for illnesses patient's daughter, who has medical power of attorney states that this is not valid and that she wishes for her father to be admitted for  this acute stroke and receive all  appropriate medical care She does confirm however that he should be DNR/DNI   Chronic pancreatitis (Justin [name])- (present on admission) Continue home regimen of pancreatic supplements Pt denies any nausea or vomiting or abdominal pain.   Essential hypertension- (present on admission) Permissive hypertension per neurology recommendations Providing patient with as needed intravenous antibiotics for markedly elevated blood pressures of greater than 220/115. BP parameters are optimal.   Coronary artery disease- (present on admission) Patient is currently chest pain free Monitoring patient on telemetry Continue home regimen of antiplatelet therapy, lipid lowering therapy    Acute metabolic encephalopathy with agitation:   Unclear if this is his baseline mental status ( appears to have  some baseline  dementia) or from UTI or stroke.  Will start him on IV rocephin.  Pt was on Depakote 125 mg BID for agitation/ moods. Prn haldol for agitation.  Psychiatry consulted for medication assistance.      Subjective: pt restless and agitated.   Physical Exam: Vitals:   05/22/21 1955 05/22/21 2340 05/23/21 0436 05/23/21 0813  BP: (!) 151/77 106/66 (!) 144/70 (!) 145/65  Pulse: 82 84  73  Resp: 19 17 20 16   Temp: 97.7 F (36.5 C) 97.8 F (36.6 C) 98.1 F (36.7 C) 98.1 F (36.7 C)  TempSrc: Oral Oral Oral Oral  SpO2: 100% 98% 97% 98%  Weight:      Height:       General exam:ill appearing gentleman, not in distress.  Respiratory system: air entry fair, no wheezing heard.  Cardiovascular system: S1 & S2 heard, RRR, no JVD, no pedal edema.  Gastrointestinal system: Abdomen is soft, NT ND BS+ Central nervous system: Alert and agitated, refusing to answer questions. Able to move all extremities.  Extremities:  no pedal edema.  Skin: No rashes seen.  Psychiatry: agitated.     Data Reviewed: Results for orders placed or performed during the hospital encounter of 05/21/21 (from the past 24  hour(s))  CBG monitoring, ED     Status: Abnormal   Collection Time: 05/22/21  1:18 PM  Result Value Ref Range   Glucose-Capillary 113 (H) 70 - 99 mg/dL  Glucose, capillary     Status: Abnormal   Collection Time: 05/22/21  5:36 PM  Result Value Ref Range   Glucose-Capillary 47 (L) 70 - 99 mg/dL  Glucose, capillary     Status: Abnormal   Collection Time: 05/22/21  5:52 PM  Result Value Ref Range   Glucose-Capillary 48 (L) 70 - 99 mg/dL  Glucose, capillary     Status: Abnormal   Collection Time: 05/22/21  6:17 PM  Result Value Ref Range   Glucose-Capillary 109 (H) 70 - 99 mg/dL  CBC with Differential/Platelet     Status: Abnormal   Collection Time: 05/22/21  8:31 PM  Result Value Ref Range   WBC 11.1 (H) 4.0 - 10.5 K/uL   RBC 3.18 (L) 4.22 - 5.81 MIL/uL   Hemoglobin 9.4 (L) 13.0 - 17.0 g/dL   HCT 28.3 (L) 39.0 - 52.0 %   MCV 89.0 80.0 - 100.0 fL   MCH 29.6 26.0 - 34.0 pg   MCHC 33.2 30.0 - 36.0 g/dL   RDW 15.4 11.5 - 15.5 %   Platelets 332 150 - 400 K/uL   nRBC 0.0 0.0 - 0.2 %   Neutrophils Relative % 61 %   Neutro Abs 6.9 1.7 - 7.7 K/uL   Lymphocytes Relative 25 %   Lymphs Abs  2.7 0.7 - 4.0 K/uL   Monocytes Relative 9 %   Monocytes Absolute 1.0 0.1 - 1.0 K/uL   Eosinophils Relative 3 %   Eosinophils Absolute 0.4 0.0 - 0.5 K/uL   Basophils Relative 1 %   Basophils Absolute 0.1 0.0 - 0.1 K/uL   Immature Granulocytes 1 %   Abs Immature Granulocytes 0.06 0.00 - 0.07 K/uL  Comprehensive metabolic panel     Status: Abnormal   Collection Time: 05/22/21  8:31 PM  Result Value Ref Range   Sodium 132 (L) 135 - 145 mmol/L   Potassium 4.0 3.5 - 5.1 mmol/L   Chloride 103 98 - 111 mmol/L   CO2 20 (L) 22 - 32 mmol/L   Glucose, Bld 213 (H) 70 - 99 mg/dL   BUN 24 (H) 8 - 23 mg/dL   Creatinine, Ser 1.69 (H) 0.61 - 1.24 mg/dL   Calcium 8.4 (L) 8.9 - 10.3 mg/dL   Total Protein 7.9 6.5 - 8.1 g/dL   Albumin 2.4 (L) 3.5 - 5.0 g/dL   AST 18 15 - 41 U/L   ALT 10 0 - 44 U/L   Alkaline  Phosphatase 69 38 - 126 U/L   Total Bilirubin 0.5 0.3 - 1.2 mg/dL   GFR, Estimated 40 (L) >60 mL/min   Anion gap 9 5 - 15  Glucose, capillary     Status: Abnormal   Collection Time: 05/22/21  9:05 PM  Result Value Ref Range   Glucose-Capillary 179 (H) 70 - 99 mg/dL   Comment 1 Notify RN    Comment 2 Document in Chart   Glucose, capillary     Status: Abnormal   Collection Time: 05/23/21  4:45 AM  Result Value Ref Range   Glucose-Capillary 41 (LL) 70 - 99 mg/dL   Comment 1 Notify RN    Comment 2 Document in Chart   Glucose, capillary     Status: Abnormal   Collection Time: 05/23/21  4:57 AM  Result Value Ref Range   Glucose-Capillary 59 (L) 70 - 99 mg/dL   Comment 1 Notify RN    Comment 2 Document in Chart   Glucose, capillary     Status: None   Collection Time: 05/23/21  5:26 AM  Result Value Ref Range   Glucose-Capillary 81 70 - 99 mg/dL  Basic metabolic panel     Status: Abnormal   Collection Time: 05/23/21  5:38 AM  Result Value Ref Range   Sodium 134 (L) 135 - 145 mmol/L   Potassium 4.0 3.5 - 5.1 mmol/L   Chloride 103 98 - 111 mmol/L   CO2 21 (L) 22 - 32 mmol/L   Glucose, Bld 80 70 - 99 mg/dL   BUN 23 8 - 23 mg/dL   Creatinine, Ser 1.77 (H) 0.61 - 1.24 mg/dL   Calcium 9.1 8.9 - 10.3 mg/dL   GFR, Estimated 37 (L) >60 mL/min   Anion gap 10 5 - 15  Glucose, capillary     Status: Abnormal   Collection Time: 05/23/21  6:33 AM  Result Value Ref Range   Glucose-Capillary 125 (H) 70 - 99 mg/dL     Family Communication: none at bedside.    Time spent: 39 minutes.   Author: Hosie Poisson, MD 05/23/2021 11:27 AM  For on call review [email or web address].

## 2021-05-23 NOTE — Progress Notes (Signed)
Records from [facility] show positive  COVID result from 05-09-21 in patient's hard chart. Since outside 10day window and patient is asymptomatic, airborne isolation d/c'd per Dr. Karleen Hampshire.

## 2021-05-23 NOTE — Progress Notes (Addendum)
Hypoglycemic Event  CBG: 48  Treatment: D50 50 mL (25 gm)  Symptoms: None  Follow-up CBG: Time:  1725 CBG Result:  152  Possible Reasons for Event: Unknown  Comments/MD notified:Dr. Karleen Hampshire notified    Justin [name]

## 2021-05-23 NOTE — Significant Event (Addendum)
Hypoglycemic Event  CBG: 41  Treatment: 8 oz juice/soda  Symptoms: None  Follow-up CBG: [id number] CBG Result:59  Follow-up CBG: Time: 0526 CBG Result: 81  Possible Reasons for Event: Inadequate meal intake Fingertips were also really cold.  Comments/MD notified: Hypoglycemia protocol enacted.    Justin [name]

## 2021-05-23 NOTE — Progress Notes (Signed)
SLP Cancellation Note  Patient Details Name: Justin [name] MRN: [id number] DOB: Jun 15, 1934   Cancelled treatment:       Reason Eval/Treat Not Completed: Other (comment). Pt agitated at this time. Pt would benefit from SLP assessment at primary care facility where baseline cognition is known. Pt will continue to need SNF level care.    Eyonna Sandstrom, Katherene Ponto 05/23/2021, 9:41 AM

## 2021-05-23 NOTE — Plan of Care (Signed)
°  Problem: Education: Goal: Knowledge of General Education information will improve Description: Including pain rating scale, medication(s)/side effects and non-pharmacologic comfort measures Outcome: Progressing   Problem: Health Behavior/Discharge Planning: Goal: Ability to manage health-related needs will improve Outcome: Progressing   Problem: Clinical Measurements: Goal: Ability to maintain clinical measurements within normal limits will improve Outcome: Progressing Goal: Will remain free from infection Outcome: Progressing Goal: Diagnostic test results will improve Outcome: Progressing Goal: Respiratory complications will improve Outcome: Progressing Goal: Cardiovascular complication will be avoided Outcome: Progressing   Problem: Activity: Goal: Risk for activity intolerance will decrease Outcome: Progressing   Problem: Nutrition: Goal: Adequate nutrition will be maintained Outcome: Progressing   Problem: Coping: Goal: Level of anxiety will decrease Outcome: Progressing   Problem: Elimination: Goal: Will not experience complications related to bowel motility Outcome: Progressing Goal: Will not experience complications related to urinary retention Outcome: Progressing   Problem: Pain Managment: Goal: General experience of comfort will improve Outcome: Progressing   Problem: Safety: Goal: Ability to remain free from injury will improve Outcome: Progressing   Problem: Skin Integrity: Goal: Risk for impaired skin integrity will decrease Outcome: Progressing   Problem: Education: Goal: Knowledge of disease or condition will improve Outcome: Progressing Goal: Knowledge of secondary prevention will improve (SELECT ALL) Outcome: Progressing Goal: Knowledge of patient specific risk factors will improve (INDIVIDUALIZE FOR PATIENT) Outcome: Progressing Goal: Individualized Educational Video(s) Outcome: Progressing   Problem: Coping: Goal: Will verbalize  positive feelings about self Outcome: Progressing Goal: Will identify appropriate support needs Outcome: Progressing   Problem: Health Behavior/Discharge Planning: Goal: Ability to manage health-related needs will improve Outcome: Progressing   Problem: Self-Care: Goal: Ability to participate in self-care as condition permits will improve Outcome: Progressing Goal: Verbalization of feelings and concerns over difficulty with self-care will improve Outcome: Progressing Goal: Ability to communicate needs accurately will improve Outcome: Progressing   Problem: Nutrition: Goal: Risk of aspiration will decrease Outcome: Progressing Goal: Dietary intake will improve Outcome: Progressing   Problem: Ischemic Stroke/TIA Tissue Perfusion: Goal: Complications of ischemic stroke/TIA will be minimized Outcome: Progressing   

## 2021-05-23 NOTE — Progress Notes (Signed)
Carotid artery duplex completed. Refer to "CV Proc" under chart review to view preliminary results.  05/23/2021 3:27 PM Kelby Aline., MHA, RVT, RDCS, RDMS

## 2021-05-23 NOTE — Consult Note (Signed)
Brief Psychiatry Consult Note  In short, this is an 87 y/o pt with a hx of dementia and no other psychiatric illness who presented to the hospital several hours after a stroke. We were consulted for agitation by Dr. Karleen Hampshire; pt notably received 2 mg IV haldol ~2 hours before exam which likely contributed to pt's inattention and sedation.  Exam conducted in presence of daughter [name] with pt assent. Patient was oriented to self and generally to situation (knew he was in the hospital after a stroke but not to which hospital or time). Fund of knowledge was poor (daughter attempted to cue on month by telling him it was Valentine's day and he was unable to come up with February). He was inattentive and fell asleep numerous times throughout exam. He had some rigidity in his RUE which might be explained by recent stroke, but did seem to have some muscle rigidity in his L bicep and LLE rasing concern that haloperidol is contributing. His daughter did not know that he was on depakote prior to this event. His buspirone was prescribed a few weeks ago because he "kept crying" when one of his sons stopped visiting; has had no appreciable effect on his mood. Allowed daughter to ventilate feelings about care pt is receiving at nursing home; she is generally in favor of deprescribing medications where able.    - Deprescribe divalproex; no clear indication and pt with chronic pancreatitis. Unlikely to reach appreciable serum levels or cross BBB at current dosages. - Deprescribe buspirone; no effect on sx - s melatonin 3 mg q 1800h - decrease PRN haloperidol to 1 mg and pair with 0.5 mg lorazepam, balancing EPS prevention with deliriogenic effects of lorazepam. - May trial cogentin if ongoing muscle rigidity this evening or tomorrow - this is highly anticholinergic and currently feel risk > benefit as pt does not seem to be bothered by muscle rigidity which was overall mild on exam.  - repeat EKG; pt s/p IV antipsychotic and  on pavloxid. Last qtc 2/12 454.    Will f/u tomorrow for brief physical exam.    Joycelyn Schmid A Brooks Kinnan

## 2021-05-23 NOTE — Progress Notes (Addendum)
STROKE TEAM PROGRESS NOTE   SUBJECTIVE (INTERVAL HISTORY) His daughter, Justin [name], is at the bedside.  Overall his condition is gradually improving. Patient still dysarthric and with RUE weakness and R facial droop, but increased RLE strength today.  CTH with no acute ischemic or hemorrhagic changes. MRI with acute infarcts in L frontoparietal cortex and smaller acute infarcts in bilateral parietal white matter. Severe chronic microvascular ischemic disease. Carotid ultrasound shows no significant bilateral extracranial stenosis OBJECTIVE CBC:  Recent Labs  Lab 05/22/21 0353 05/22/21 2031  WBC 10.8* 11.1*  NEUTROABS 6.4 6.9  HGB 10.1* 9.4*  HCT 32.3* 28.3*  MCV 92.6 89.0  PLT 347 022    Basic Metabolic Panel:  Recent Labs  Lab 05/22/21 0353 05/22/21 2031 05/23/21 0538  NA 134* 132* 134*  K 4.5 4.0 4.0  CL 106 103 103  CO2 20* 20* 21*  GLUCOSE 118* 213* 80  BUN 28* 24* 23  CREATININE 1.82* 1.69* 1.77*  CALCIUM 8.6* 8.4* 9.1  MG 1.7  --   --     Lipid Panel:  Recent Labs  Lab 05/22/21 0353  CHOL 138  TRIG 97  HDL 34*  CHOLHDL 4.1  VLDL 19  LDLCALC 85    HgbA1c:  Recent Labs  Lab 05/22/21 0353  HGBA1C 7.6*    Urine Drug Screen: No results for input(s): LABOPIA, COCAINSCRNUR, LABBENZ, AMPHETMU, THCU, LABBARB in the last 168 hours.  Alcohol Level No results for input(s): ETH in the last 168 hours.  IMAGING past 24 hours MR ANGIO HEAD WO CONTRAST  Result Date: 05/22/2021 CLINICAL DATA:  Neuro deficit, acute, stroke suspected EXAM: MRI HEAD WITHOUT CONTRAST MRA HEAD WITHOUT CONTRAST MRA NECK WITHOUT CONTRAST TECHNIQUE: Multiplanar, multiecho pulse sequences of the brain and surrounding structures were obtained without intravenous contrast. Angiographic images of the Circle of Willis were obtained using MRA technique without intravenous contrast. Angiographic images of the neck were obtained using MRA technique without intravenous contrast. Carotid stenosis  measurements (when applicable) are obtained utilizing NASCET criteria, using the distal internal carotid diameter as the denominator. COMPARISON:  CT head February, 2023. FINDINGS: MRI HEAD FINDINGS Moderately motion limited study.  Within this limitation: Brain: Acute infarcts in the left perirolandic frontoparietal cortex. Additional smaller acute infarct in the more inferior left parietal lobe and right periventricular parietal lobe, both in white matter. No significant mass effect. Extensive confluent and patchy T2/FLAIR hyperintense in the white matter, nonspecific but compatible chronic microvascular disease. Moderate atrophy with ex vacuo ventricular dilation. No evidence of acute hemorrhage, mass lesion, midline shift, hydrocephalus or extra-axial fluid collection. Vascular: See below. Skull and upper cervical spine: Normal marrow signal. Sinuses/Orbits: Near complete opacification of the ethmoid air cells with moderate mucosal thickening of the frontal sinuses, maxillary sinuses and sphenoid sinuses and air-fluid levels. Other: No mastoid effusions. MRA HEAD FINDINGS Anterior circulation: Moderate narrowing of the left ICA at the skull base. Intracranial ICAs are patent with mild right paraclinoid ICA stenosis. Bilateral MCAs and ACAs are patent without proximal hemodynamically significant stenosis. No definite aneurysm identified. Posterior circulation: Right dominant intradural vertebral artery. Mild-to-moderate narrowing of the left intradural vertebral artery. Basilar artery and bilateral posterior cerebral arteries are patent without proximal hemodynamically significant stenosis. No aneurysm identified. MRA NECK FINDINGS Motion limited. Great vessel origins are not imaged and there is limited visualization of the proximal vessels. The visualized common carotid arteries and proximal internal carotid arteries are patent without evidence of severe significant stenosis. Moderate stenosis of the left ICA  at  the skull base, better characterized on MRA head. Bilateral vertebral arteries are patent without evidence of severe stenosis in the neck. IMPRESSION: MRI head: 1. Moderately motion study with acute infarcts in the left perirolandic frontoparietal cortex and smaller acute infarcts in bilateral parietal white matter. 2. Severe chronic microvascular ischemic disease and moderate cerebral atrophy (ICD10-G31.9). 3. Probable sinusitis, detailed above. MRA: 1. No large vessel occlusion. 2. Moderate stenosis of the left internal carotid artery at the skull base. 3. Mild to moderate left intradural vertebral artery stenosis. 4. Limited evaluation in the neck without visible severe stenosis. CTA could provide more sensitive evaluation if clinically indicated. Electronically Signed   By: Margaretha Sheffield M.D.   On: 05/22/2021 13:10   MR ANGIO NECK WO CONTRAST  Result Date: 05/22/2021 CLINICAL DATA:  Neuro deficit, acute, stroke suspected EXAM: MRI HEAD WITHOUT CONTRAST MRA HEAD WITHOUT CONTRAST MRA NECK WITHOUT CONTRAST TECHNIQUE: Multiplanar, multiecho pulse sequences of the brain and surrounding structures were obtained without intravenous contrast. Angiographic images of the Circle of Willis were obtained using MRA technique without intravenous contrast. Angiographic images of the neck were obtained using MRA technique without intravenous contrast. Carotid stenosis measurements (when applicable) are obtained utilizing NASCET criteria, using the distal internal carotid diameter as the denominator. COMPARISON:  CT head February, 2023. FINDINGS: MRI HEAD FINDINGS Moderately motion limited study.  Within this limitation: Brain: Acute infarcts in the left perirolandic frontoparietal cortex. Additional smaller acute infarct in the more inferior left parietal lobe and right periventricular parietal lobe, both in white matter. No significant mass effect. Extensive confluent and patchy T2/FLAIR hyperintense in the white  matter, nonspecific but compatible chronic microvascular disease. Moderate atrophy with ex vacuo ventricular dilation. No evidence of acute hemorrhage, mass lesion, midline shift, hydrocephalus or extra-axial fluid collection. Vascular: See below. Skull and upper cervical spine: Normal marrow signal. Sinuses/Orbits: Near complete opacification of the ethmoid air cells with moderate mucosal thickening of the frontal sinuses, maxillary sinuses and sphenoid sinuses and air-fluid levels. Other: No mastoid effusions. MRA HEAD FINDINGS Anterior circulation: Moderate narrowing of the left ICA at the skull base. Intracranial ICAs are patent with mild right paraclinoid ICA stenosis. Bilateral MCAs and ACAs are patent without proximal hemodynamically significant stenosis. No definite aneurysm identified. Posterior circulation: Right dominant intradural vertebral artery. Mild-to-moderate narrowing of the left intradural vertebral artery. Basilar artery and bilateral posterior cerebral arteries are patent without proximal hemodynamically significant stenosis. No aneurysm identified. MRA NECK FINDINGS Motion limited. Great vessel origins are not imaged and there is limited visualization of the proximal vessels. The visualized common carotid arteries and proximal internal carotid arteries are patent without evidence of severe significant stenosis. Moderate stenosis of the left ICA at the skull base, better characterized on MRA head. Bilateral vertebral arteries are patent without evidence of severe stenosis in the neck. IMPRESSION: MRI head: 1. Moderately motion study with acute infarcts in the left perirolandic frontoparietal cortex and smaller acute infarcts in bilateral parietal white matter. 2. Severe chronic microvascular ischemic disease and moderate cerebral atrophy (ICD10-G31.9). 3. Probable sinusitis, detailed above. MRA: 1. No large vessel occlusion. 2. Moderate stenosis of the left internal carotid artery at the skull  base. 3. Mild to moderate left intradural vertebral artery stenosis. 4. Limited evaluation in the neck without visible severe stenosis. CTA could provide more sensitive evaluation if clinically indicated. Electronically Signed   By: Margaretha Sheffield M.D.   On: 05/22/2021 13:10   MR BRAIN WO CONTRAST  Result Date: 05/22/2021  CLINICAL DATA:  Neuro deficit, acute, stroke suspected EXAM: MRI HEAD WITHOUT CONTRAST MRA HEAD WITHOUT CONTRAST MRA NECK WITHOUT CONTRAST TECHNIQUE: Multiplanar, multiecho pulse sequences of the brain and surrounding structures were obtained without intravenous contrast. Angiographic images of the Circle of Willis were obtained using MRA technique without intravenous contrast. Angiographic images of the neck were obtained using MRA technique without intravenous contrast. Carotid stenosis measurements (when applicable) are obtained utilizing NASCET criteria, using the distal internal carotid diameter as the denominator. COMPARISON:  CT head February, 2023. FINDINGS: MRI HEAD FINDINGS Moderately motion limited study.  Within this limitation: Brain: Acute infarcts in the left perirolandic frontoparietal cortex. Additional smaller acute infarct in the more inferior left parietal lobe and right periventricular parietal lobe, both in white matter. No significant mass effect. Extensive confluent and patchy T2/FLAIR hyperintense in the white matter, nonspecific but compatible chronic microvascular disease. Moderate atrophy with ex vacuo ventricular dilation. No evidence of acute hemorrhage, mass lesion, midline shift, hydrocephalus or extra-axial fluid collection. Vascular: See below. Skull and upper cervical spine: Normal marrow signal. Sinuses/Orbits: Near complete opacification of the ethmoid air cells with moderate mucosal thickening of the frontal sinuses, maxillary sinuses and sphenoid sinuses and air-fluid levels. Other: No mastoid effusions. MRA HEAD FINDINGS Anterior circulation: Moderate  narrowing of the left ICA at the skull base. Intracranial ICAs are patent with mild right paraclinoid ICA stenosis. Bilateral MCAs and ACAs are patent without proximal hemodynamically significant stenosis. No definite aneurysm identified. Posterior circulation: Right dominant intradural vertebral artery. Mild-to-moderate narrowing of the left intradural vertebral artery. Basilar artery and bilateral posterior cerebral arteries are patent without proximal hemodynamically significant stenosis. No aneurysm identified. MRA NECK FINDINGS Motion limited. Great vessel origins are not imaged and there is limited visualization of the proximal vessels. The visualized common carotid arteries and proximal internal carotid arteries are patent without evidence of severe significant stenosis. Moderate stenosis of the left ICA at the skull base, better characterized on MRA head. Bilateral vertebral arteries are patent without evidence of severe stenosis in the neck. IMPRESSION: MRI head: 1. Moderately motion study with acute infarcts in the left perirolandic frontoparietal cortex and smaller acute infarcts in bilateral parietal white matter. 2. Severe chronic microvascular ischemic disease and moderate cerebral atrophy (ICD10-G31.9). 3. Probable sinusitis, detailed above. MRA: 1. No large vessel occlusion. 2. Moderate stenosis of the left internal carotid artery at the skull base. 3. Mild to moderate left intradural vertebral artery stenosis. 4. Limited evaluation in the neck without visible severe stenosis. CTA could provide more sensitive evaluation if clinically indicated. Electronically Signed   By: Margaretha Sheffield M.D.   On: 05/22/2021 13:10   ECHOCARDIOGRAM LIMITED  Result Date: 05/22/2021    ECHOCARDIOGRAM LIMITED REPORT   Patient Name:   Justin [name] Date of Exam: 05/22/2021 Medical Rec #:  [id number]        Height:       72.0 in Accession #:    [id number]       Weight:       172.0 lb Date of Birth:  [birth year]         BSA:          1.998 m Patient Age:    22 years         BP:           127/61 mmHg Patient Gender: M                HR:  82 bpm. Exam Location:  Inpatient Procedure: Limited Echo, Color Doppler and Cardiac Doppler Indications:     Stroke i63.9  History:         Patient has no prior history of Echocardiogram examinations.                  CAD; Risk Factors:Hypertension, Diabetes and Dyslipidemia.  Sonographer:     Raquel Sarna Senior RDCS Referring Phys:  [id number] [place] Diagnosing Phys: Oswaldo Milian MD  Sonographer Comments: COVID+ at time of study IMPRESSIONS  1. Left ventricular ejection fraction, by estimation, is 60 to 65%. The left ventricle has normal function. The left ventricle has no regional wall motion abnormalities.  2. Right ventricular systolic function is normal. The right ventricular size is normal. There is normal pulmonary artery systolic pressure. The estimated right ventricular systolic pressure is 62.8 mmHg.  3. The mitral valve is abnormal. Moderate leaflet thickening. No evidence of mitral valve regurgitation.  4. Tricuspid valve regurgitation is mild to moderate.  5. The aortic valve is tricuspid. Aortic valve regurgitation is not visualized. AV gradients were not measured  6. The inferior vena cava is normal in size with greater than 50% respiratory variability, suggesting right atrial pressure of 3 mmHg. FINDINGS  Left Ventricle: Left ventricular ejection fraction, by estimation, is 60 to 65%. The left ventricle has normal function. The left ventricle has no regional wall motion abnormalities. Right Ventricle: The right ventricular size is normal. Right ventricular systolic function is normal. There is normal pulmonary artery systolic pressure. The tricuspid regurgitant velocity is 2.75 m/s, and with an assumed right atrial pressure of 3 mmHg,  the estimated right ventricular systolic pressure is 31.5 mmHg. Mitral Valve: The mitral valve is abnormal. There is moderate  thickening of the mitral valve leaflet(s). Tricuspid Valve: The tricuspid valve is normal in structure. Tricuspid valve regurgitation is mild to moderate. Aortic Valve: The aortic valve is tricuspid. Aortic valve regurgitation is not visualized. No aortic stenosis is present. Pulmonic Valve: The pulmonic valve was not well visualized. Pulmonic valve regurgitation is not visualized. Venous: The inferior vena cava is normal in size with greater than 50% respiratory variability, suggesting right atrial pressure of 3 mmHg. RIGHT VENTRICLE RV S prime:     13.20 cm/s TAPSE (M-mode): 2.1 cm TRICUSPID VALVE TR Peak grad:   30.2 mmHg TR Vmax:        275.00 cm/s Oswaldo Milian MD Electronically signed by Oswaldo Milian MD Signature Date/Time: 05/22/2021/2:49:53 PM    Final (Updated)      PHYSICAL EXAM Temp:  [97.5 F (36.4 C)-98.1 F (36.7 C)] 98.1 F (36.7 C) (02/14 0813) Pulse Rate:  [73-87] 73 (02/14 0813) Resp:  [14-20] 16 (02/14 0813) BP: (106-151)/(65-77) 145/65 (02/14 0813) SpO2:  [97 %-100 %] 98 % (02/14 0813)  General - Well nourished, well developed, elderly African-American male in no apparent distress.  Cardiovascular - Regular rhythm and rate on telemetry. 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 Mental Status -  Level of arousal and orientation to person was intact. Reported year as 2010 and  place as [facility] home [facility] hospital when given options. Language including expression, naming, repetition, comprehension was assessed and found dysarthric.  Improvements in speech fluency but still with occasional word finding difficulties  Cranial Nerves II - XII - II - Blinks to threat on the L and sometimes on R as well.  III, IV, VI - Extraocular movements largely intact. Briefly shifts gaze across midline to L,  but unable to hold > 3 seconds  V - Facial sensation intact bilaterally. VII - Facial movement with R droop at rest and when smiling.  VIII - Hearing & vestibular  intact bilaterally. X - Palate elevates symmetrically. XI - Shoulder shrug intact but weak bilaterally. Head preferentially rotated left.  XII - Tongue protrusion with slight R deviation  Motor Strength - RUE 1+/5, attempts to lift with some movement and able to move horizontally. RLE 2+/5 able to lift and maintain >5 sec. LUE 5/5. LLE 5/5  Bulk was decreased in RUE Motor Tone - Muscle tone was assessed at the neck and appendages and was normal.  Sensory - Light touch was assessed and again revealed some neglect. Patient unable to recognize L leg being touched when both extremities were touched at the same time; this is inconsistent.  Coordination - The patient had slow but normal FNF on the L; unable to complete on the R.  Tremor was absent.  Gait and Station - deferred.    ASSESSMENT/PLAN [name] is a 87 y.o. male with history of arthritis, DM, HTN, hypercholesterolemia, iron deficiency, prior episode of acute pancreatitis in 2014, CKD Stage IIIb, prior stroke and TIA presenting from [facility] with new onset of right facial droop, plegic RUE and paretic RLE.   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 Old bilateral cerebellar infarcts. Atrophy, chronic microvascular disease. CTA head & neck Attempted but patient restless. Will not obtain, as patient's Cr 1.82-1.83. Will instead obtain Carotid Doppler. MRI  Acute infarcts in L frontoparietal cortex and smaller acute infarcts in bilateral parietal white matter. Severe chronic microvascular ischemic disease MRA  No large vessel occlusion Carotid Doppler bilateral 1-39% carotid stenosis  2D Echo  ejection fraction 60 to 65%.  LDL 85 HgbA1c 7.6 VTE prophylaxis - SQ Lovenox    Diet   Diet Heart Room service appropriate? Yes; Fluid consistency: Thin   clopidogrel 75 mg daily prior to admission, now on aspirin 81 mg  daily and clopidogrel 75 mg daily. X 3 weeks and then aspirin alone Therapy recommendations:  Return to [facility] with PT, SNF per OT Disposition:  Pending  Hypertension Home meds:  N/A Stable SBP goal <160 but gradually normalize in 5-7 days Long-term BP goal normotensive  Hyperlipidemia Home meds:  Atorvastatin 40 mg, resumed in hospital LDL 85, goal < 70 High intensity statin indicated; will increase to 80 mg daily Continue statin at discharge  Diabetes type II Controlled Home meds:  Glipizide 2.5 mg, Novolog 30-40u mealtime correction, Lantus 45u qHS, Metformin 1000 mg BID HgbA1c 7.6, goal < 7.0 CBGs Recent Labs    05/23/21 0457 05/23/21 0526 05/23/21 0633  GLUCAP 59* 81 125*     SSI  Other Stroke Risk Factors Advanced Age >/= 30  Former cigarette smoker; stopped 31 years ago ETOH use, alcohol level No results found for requested labs within last 26280 hours., advised to drink no more than 1-2 drink(s) a day Substance abuse - UDS:  THC No results found for requested labs within last 26280 hours., Cocaine No results found for requested labs within last 26280 hours.. Patient advised to stop using due to stroke risk. Hx stroke/TIA  Other Active Problems N/A  Hospital day # 2    Rosezetta Schlatter, MD Stroke Neurology- Neuro Psych Resident 05/23/2021 11:13 AM I have personally obtained history,examined this patient, reviewed notes, independently  viewed imaging studies, participated in medical decision making and plan of care.ROS completed by me personally and pertinent positives fully documented  I have made any additions or clarifications directly to the above note. Agree with note above.  Patient is not a good long-term anticoagulation candidate given his poor baseline functioning and still not proceed with loop recorder to detect paroxysmal A-fib.  Continue aspirin Plavix for 3 weeks then aspirin alone and aggressive risk factor modification.  Transfer back to skilled  nursing facility when bed available.  Stroke team will sign off.  Kindly call for questions.  Discussed with Dr. Karleen Hampshire.  Greater than 50% time during the 35-minute visit with spent in counseling and coordination of care operative strokes discussion with patient and care team and answering questions.  Antony Contras, MD Medical Director [facility] Pager: [phone] 05/23/2021 4:40 PM    To contact Stroke Continuity provider, please refer to [email or web address] After hours, contact General Neurology

## 2021-05-24 ENCOUNTER — Inpatient Hospital Stay (HOSPITAL_COMMUNITY): Payer: Medicare Other

## 2021-05-24 DIAGNOSIS — Z515 Encounter for palliative care: Secondary | ICD-10-CM

## 2021-05-24 DIAGNOSIS — G9341 Metabolic encephalopathy: Secondary | ICD-10-CM

## 2021-05-24 DIAGNOSIS — F0390 Unspecified dementia without behavioral disturbance: Secondary | ICD-10-CM

## 2021-05-24 DIAGNOSIS — R451 Restlessness and agitation: Secondary | ICD-10-CM

## 2021-05-24 HISTORY — DX: Metabolic encephalopathy: G93.41

## 2021-05-24 LAB — BASIC METABOLIC PANEL
Anion gap: 8 (ref 5–15)
BUN: 20 mg/dL (ref 8–23)
CO2: 25 mmol/L (ref 22–32)
Calcium: 8.8 mg/dL — ABNORMAL LOW (ref 8.9–10.3)
Chloride: 103 mmol/L (ref 98–111)
Creatinine, Ser: 1.62 mg/dL — ABNORMAL HIGH (ref 0.61–1.24)
GFR, Estimated: 42 mL/min — ABNORMAL LOW (ref >60)
Glucose, Bld: 98 mg/dL (ref 70–99)
Potassium: 4.2 mmol/L (ref 3.5–5.1)
Sodium: 136 mmol/L (ref 135–145)

## 2021-05-24 LAB — GLUCOSE, CAPILLARY
Glucose-Capillary: 128 mg/dL — ABNORMAL HIGH (ref 70–99)
Glucose-Capillary: 131 mg/dL — ABNORMAL HIGH (ref 70–99)
Glucose-Capillary: 311 mg/dL — ABNORMAL HIGH (ref 70–99)
Glucose-Capillary: 198 mg/dL — ABNORMAL HIGH (ref 70–99)

## 2021-05-24 LAB — CBC WITH DIFFERENTIAL/PLATELET
Abs Immature Granulocytes: 0.07 10*3/uL (ref 0.00–0.07)
Basophils Absolute: 0.1 10*3/uL (ref 0.0–0.1)
Basophils Relative: 1 %
Eosinophils Absolute: 0.4 10*3/uL (ref 0.0–0.5)
Eosinophils Relative: 4 %
HCT: 28.3 % — ABNORMAL LOW (ref 39.0–52.0)
Hemoglobin: 9.4 g/dL — ABNORMAL LOW (ref 13.0–17.0)
Immature Granulocytes: 1 %
Lymphocytes Relative: 31 %
Lymphs Abs: 3.1 10*3/uL (ref 0.7–4.0)
MCH: 29.7 pg (ref 26.0–34.0)
MCHC: 33.2 g/dL (ref 30.0–36.0)
MCV: 89.6 fL (ref 80.0–100.0)
Monocytes Absolute: 1 10*3/uL (ref 0.1–1.0)
Monocytes Relative: 10 %
Neutro Abs: 5.4 10*3/uL (ref 1.7–7.7)
Neutrophils Relative %: 53 %
Platelets: 294 10*3/uL (ref 150–400)
RBC: 3.16 MIL/uL — ABNORMAL LOW (ref 4.22–5.81)
RDW: 15.6 % — ABNORMAL HIGH (ref 11.5–15.5)
WBC: 10.2 10*3/uL (ref 4.0–10.5)
nRBC: 0 % (ref 0.0–0.2)

## 2021-05-24 LAB — URINE CULTURE: Culture: >=100000 — AB

## 2021-05-24 LAB — MAGNESIUM: Magnesium: 1.9 mg/dL (ref 1.7–2.4)

## 2021-05-24 IMAGING — US US RENAL
1 series · 14 of 25 positions shown · non-contrast
Comparison: [DATE].

CLINICAL DATA: PINON.

EXAM:
RENAL / URINARY TRACT ULTRASOUND COMPLETE

[Series 1: us renal · 14 of 50 slices shown]
[im 1/50]
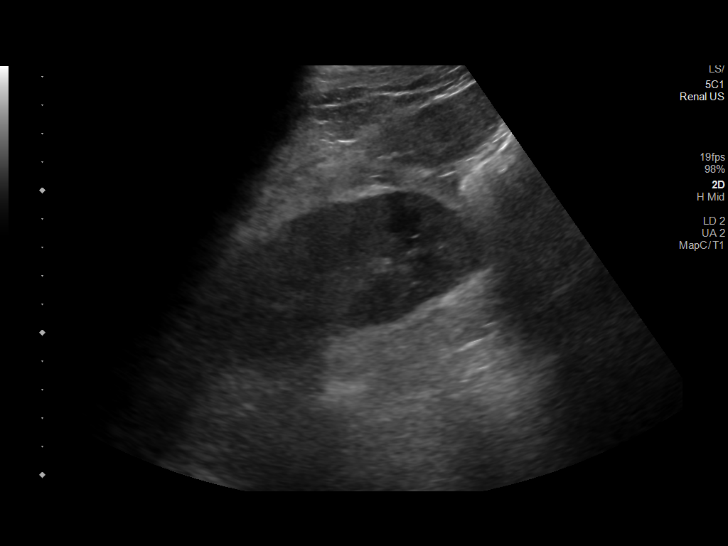
[im 5/50]
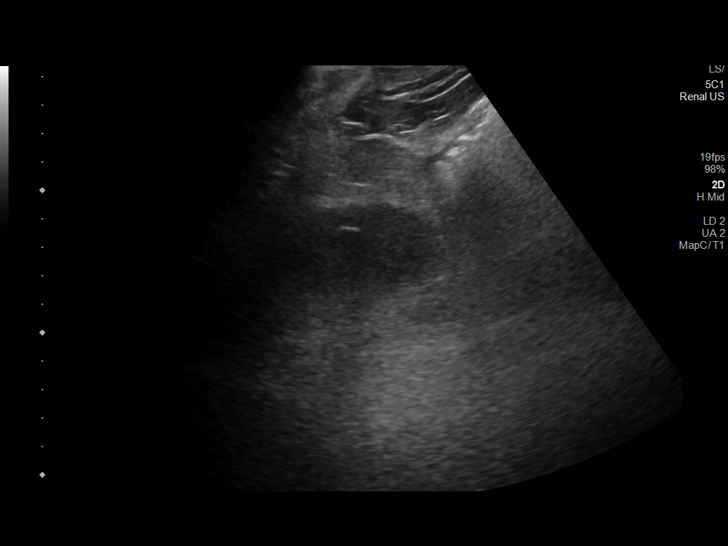
[im 9/50]
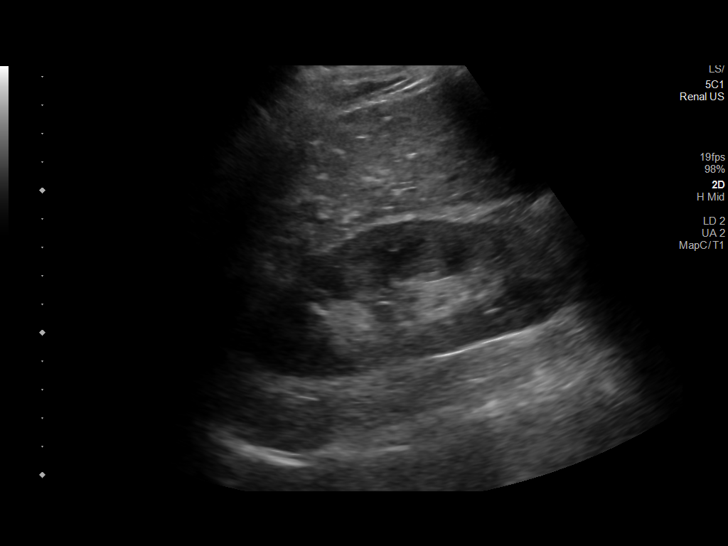
[im 13/50]
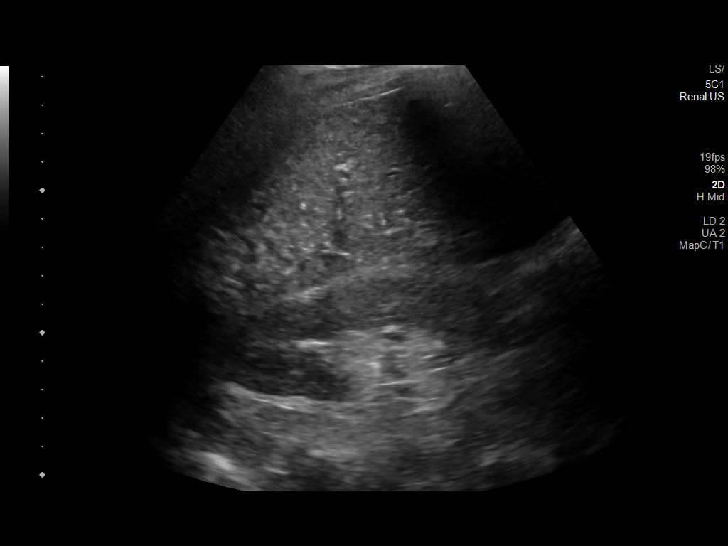
[im 17/50]
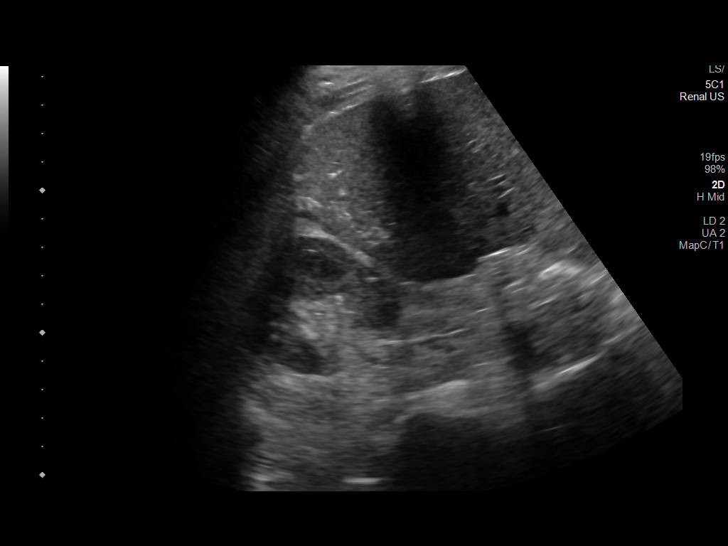
[im 19/50]
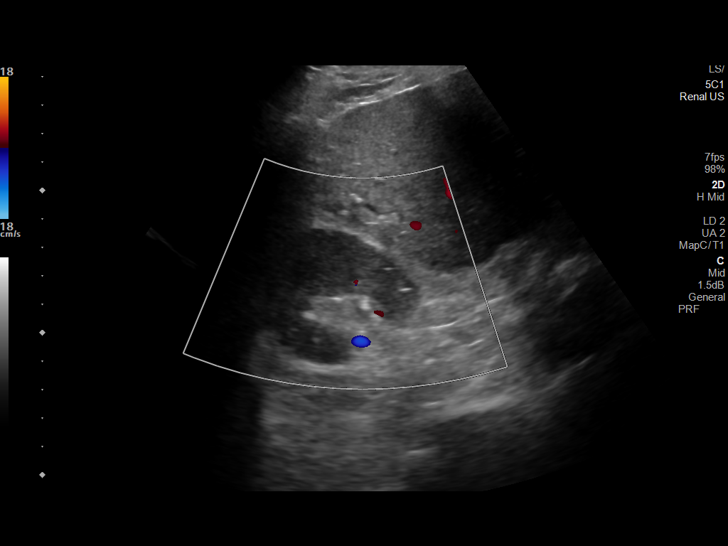
[im 23/50]
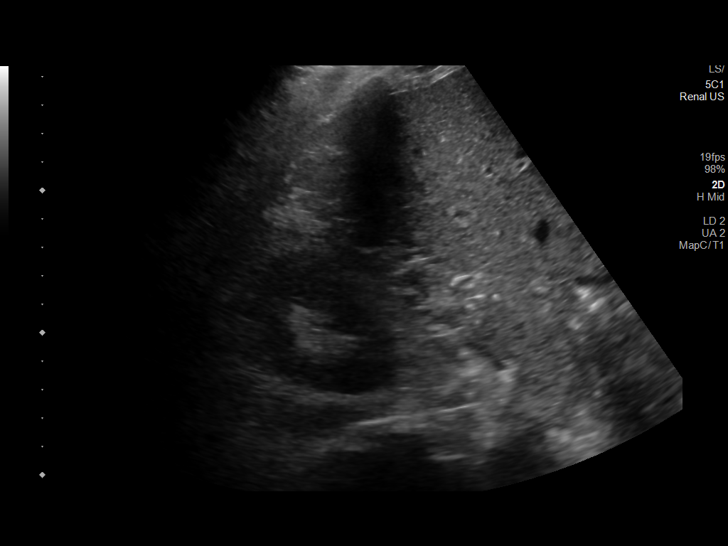
[im 27/50]
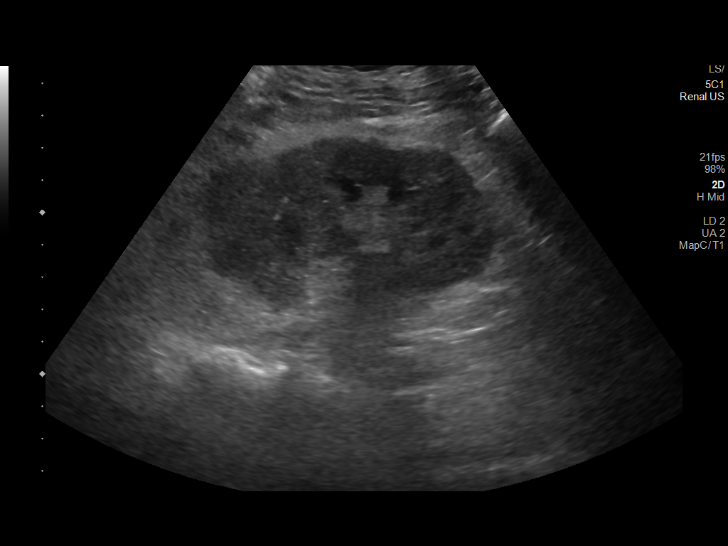
[im 31/50]
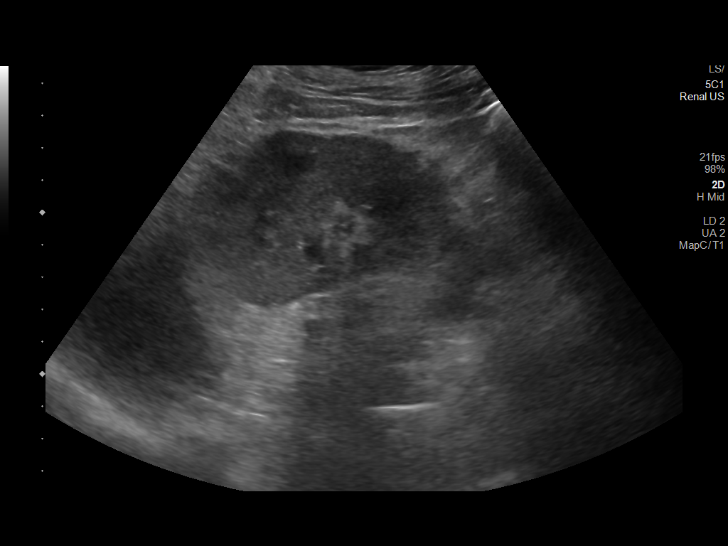
[im 33/50]
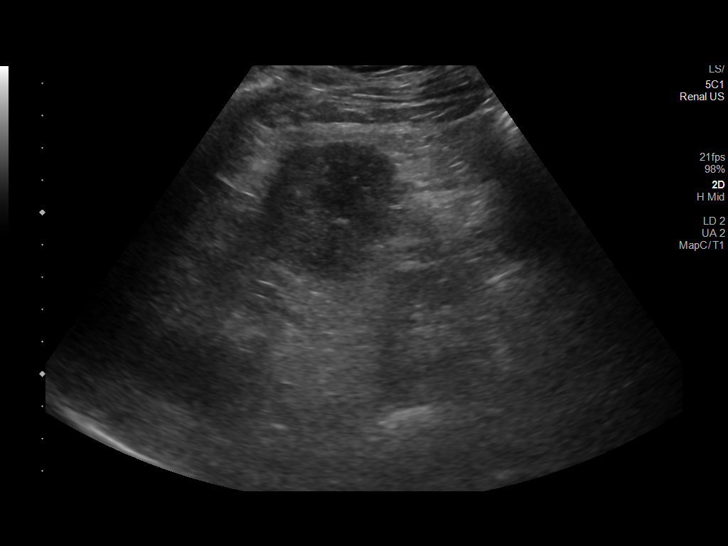
[im 37/50]
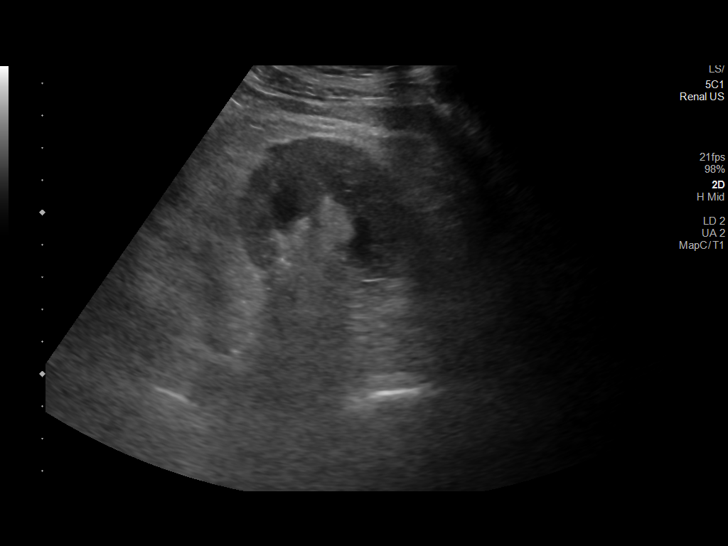
[im 41/50]
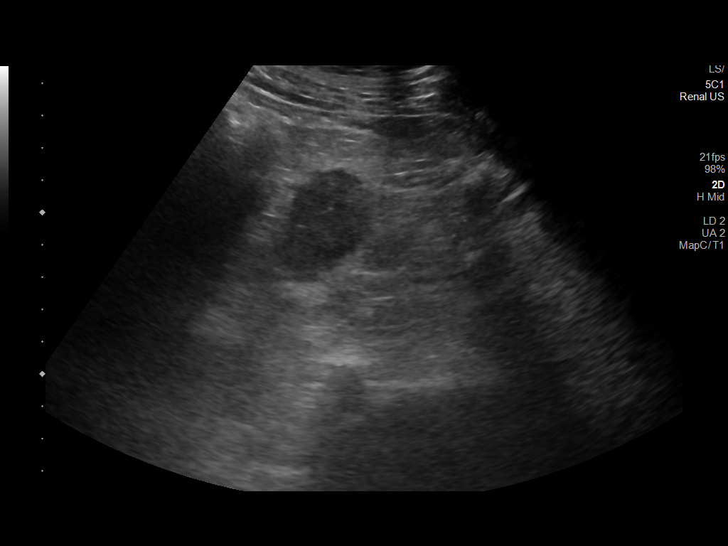
[im 45/50]
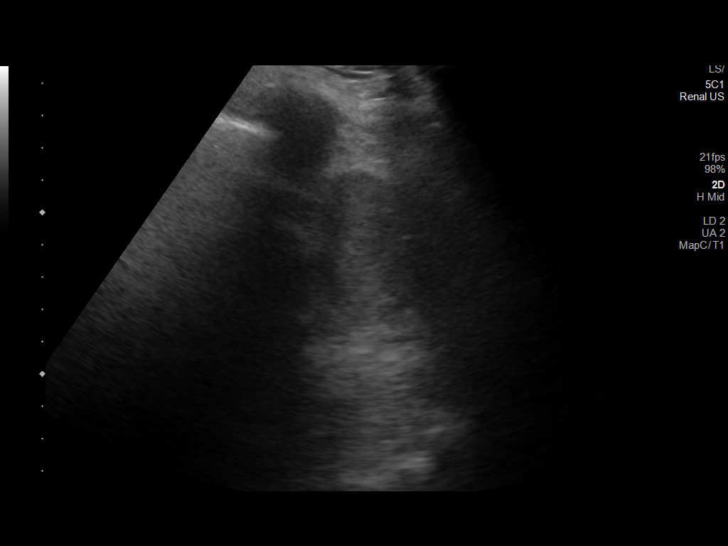
[im 50/50]
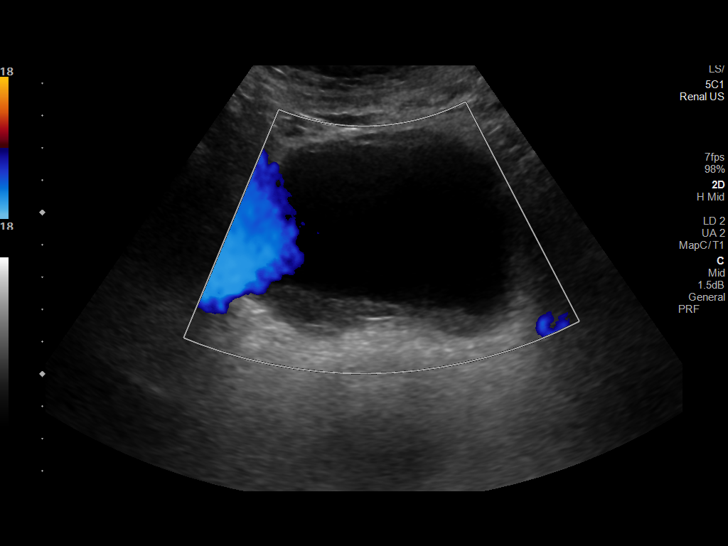

[14 of 25 positions shown; findings below may reference images not displayed]

FINDINGS: Right Kidney:

Renal measurements: 11.5 x 4.8 x 3.9 cm = volume: 113.34 mL.
Echogenicity within normal limits. No mass or hydronephrosis
visualized.

Left Kidney:

Renal measurements: 9.8 x 5.8 x 4.6 cm = volume: 134.38 mL.
Echogenicity within normal limits. No mass or hydronephrosis
visualized.

Bladder:

Debris is present in the posterior bladder. Bilateral ureteral jets
are noted.

Other:

None.
IMPRESSION: 1. Normal evaluation of the kidneys.
2. Debris in the urinary bladder, possible infectious or
inflammatory cystitis.

## 2021-05-24 NOTE — Progress Notes (Signed)
Progress Note  Patient: Justin [name] RSW:[id number] DOB: 10/19/1934  DOA: 05/21/2021  DOS: 05/24/2021    Brief hospital course: 87 year old male with past medical history of coronary artery disease, gastroesophageal reflux disease, diabetes mellitus type 2, prior stroke, hyperlipidemia, hypertension and chronic pancreatitis who presents to [facility] emergency department from [facility] facility via EMS due to right-sided weakness with concerns for stroke.  Assessment and Plan: * Acute ischemic stroke ([place])- (present on admission) Patient presenting with new onset right-sided weakness and right facial droop concerning for acute ischemic stroke. MRI of the brain showed Moderately motion study with acute infarcts in the left perirolandic frontoparietal cortex and smaller acute infarcts in bilateral parietal white matter. MRA head and neck does not show any large vessel occlusion.  - Per neurology > aspirin and plavix for 3 weeks, then aspirin alone.  - Home regimen of daily statin therapy to be continued  Acute metabolic encephalopathy Psychiatry consulted. Agitation has improved significantly with their medication changes. They've signed off. Will follow rec's. Palliative care consult also pending.  COVID-19 virus infection- (present on admission) Incidental finding with COVID-19 PCR being positive prior to arrival, no longer requires isolation or treatment. Did receive a few doses of paxlovid which has been stopped, especially in light of plavix use.  Leukocytosis- (present on admission) Continue ceftriaxone with polymicrobial growth on urine culture without alternative source.  Polyneuropathy due to type 2 diabetes mellitus ([place])- (present on admission) Continue home regimen of gabapentin  Mixed diabetic hyperlipidemia associated with type 2 diabetes mellitus ([place])- (present on admission) Hemoglobin A1c is 7.6%. CBG (last 3)  Recent Labs    05/22/21 0413  05/22/21 0831 05/22/21 1318  GLUCAP 157* 102* 113*   Resume SSI.   Chronic kidney disease, stage 3b ([place])- (present on admission) Strict intake and output monitoring Creatinine seems to have risen to 1.83, up from 1.362 years ago however I presume this is progression of chronic kidney disease and not acute kidney injury. Minimizing nephrotoxic agents as much as possible Continue to renal parameters.    Type 2 diabetes mellitus with stage 3b chronic kidney disease, with long-term current use of insulin ([place]) Patient been placed on Accu-Cheks before every meal and nightly with sliding scale insulin Reducing home regimen of basal bolus insulin therapy by 50% considering tenuous oral intake.  This can be uptitrated as necessary to achieve glycemic control Hemoglobin A1C ordered Diabetic Diet   Goals of care, counseling/discussion All patient historically has had a MOST form that mentions that patient should not be hospitalized for illnesses. Patient's daughter who is HCPOA states that this is not valid and that she wishes for her father to be admitted for this acute stroke and receive all appropriate medical care She does confirm however that he should be DNR/DNI   Chronic pancreatitis ([place])- (present on admission) Continue home regimen of pancreatic supplements Pt denies any nausea or vomiting or abdominal pain.   Essential hypertension- (present on admission) Permissive hypertension per neurology recommendations Providing patient with as needed intravenous antibiotics for markedly elevated blood pressures of greater than 220/115. BP parameters are optimal.   Coronary artery disease- (present on admission) Patient is currently chest pain free Monitoring patient on telemetry Continue home regimen of antiplatelet therapy, lipid lowering therapy     Subjective: Justin [name] in the bed but not on the patient, less frequent outbursts today. Pt without complaints.  Objective: Vitals:    05/24/21 0938 05/24/21 0802 05/24/21 0908 05/24/21 1125  BP: (!) 161/75  (!) 150/95 (!) 142/86  Pulse: 82 80 88 89  Resp: 16 18 18 16   Temp: 98.5 F (36.9 C)  98 F (36.7 C) 97.8 F (36.6 C)  TempSrc:   Oral Oral  SpO2: 100%  100% 100%  Weight:      Height:       Gen:  87 y.o. male in no distress Pulm: Nonlabored breathing room air.  CV: Regular rate and rhythm. No murmur, rub, or gallop. No JVD, no dependent edema. GI: Abdomen soft, non-tender, non-distended, with normoactive bowel sounds.  Ext: Warm, no deformities Skin: No rashes, lesions or ulcers on visualized skin. Neuro: Alert and incompletely oriented without new focal neurological deficits. Psych: Judgement and insight appear impaired. No agitation at this time.    Data Personally reviewed:  CBC: Recent Labs  Lab 05/21/21 1817 05/22/21 0353 05/22/21 2031 05/24/21 0423  WBC 13.2* 10.8* 11.1* 10.2  NEUTROABS 6.3 6.4 6.9 5.4  HGB 10.1* 10.1* 9.4* 9.4*  HCT 31.5* 32.3* 28.3* 28.3*  MCV 89.5 92.6 89.0 89.6  PLT 358 347 332 299   Basic Metabolic Panel: Recent Labs  Lab 05/21/21 1817 05/22/21 0353 05/22/21 2031 05/23/21 0538 05/24/21 0829  NA 133* 134* 132* 134* 136  K 4.1 4.5 4.0 4.0 4.2  CL 104 106 103 103 103  CO2 22 20* 20* 21* 25  GLUCOSE 108* 118* 213* 80 98  BUN 28* 28* 24* 23 20  CREATININE 1.83* 1.82* 1.69* 1.77* 1.62*  CALCIUM 8.7* 8.6* 8.4* 9.1 8.8*  MG  --  1.7  --   --  1.9   GFR: Estimated Creatinine Clearance: 37.3 mL/min (A) (by C-G formula based on SCr of 1.62 mg/dL (H)). Liver Function Tests: Recent Labs  Lab 05/22/21 0353 05/22/21 2031  AST 19 18  ALT 10 10  ALKPHOS 73 69  BILITOT 0.4 0.5  PROT 8.7* 7.9  ALBUMIN 2.5* 2.4*   No results for input(s): LIPASE, AMYLASE in the last 168 hours. No results for input(s): AMMONIA in the last 168 hours. Coagulation Profile: Recent Labs  Lab 05/21/21 1817  INR 1.1   Cardiac Enzymes: No results for input(s): CKTOTAL, CKMB,  CKMBINDEX, TROPONINI in the last 168 hours. BNP (last 3 results) No results for input(s): PROBNP in the last 8760 hours. HbA1C: Recent Labs    05/22/21 0353  HGBA1C 7.6*   CBG: Recent Labs  Lab 05/23/21 1656 05/23/21 1725 05/23/21 2112 05/24/21 0558 05/24/21 1128  GLUCAP 48* 152* 112* 128* 131*   Lipid Profile: Recent Labs    05/22/21 0353  CHOL 138  HDL 34*  LDLCALC 85  TRIG 97  CHOLHDL 4.1   Thyroid Function Tests: No results for input(s): TSH, T4TOTAL, FREET4, T3FREE, THYROIDAB in the last 72 hours. Anemia Panel: No results for input(s): VITAMINB12, FOLATE, FERRITIN, TIBC, IRON, RETICCTPCT in the last 72 hours. Urine analysis:    Component Value Date/Time   COLORURINE YELLOW 05/21/2021 0840   APPEARANCEUR CLOUDY (A) 05/21/2021 0840   LABSPEC 1.012 05/21/2021 0840   PHURINE 7.0 05/21/2021 0840   GLUCOSEU NEGATIVE 05/21/2021 0840   HGBUR SMALL (A) 05/21/2021 0840   BILIRUBINUR NEGATIVE 05/21/2021 0840   BILIRUBINUR negative 09/27/2015 1629   BILIRUBINUR neg 07/01/2012 0902   KETONESUR NEGATIVE 05/21/2021 0840   PROTEINUR 100 (A) 05/21/2021 0840   UROBILINOGEN 4.0 09/27/2015 1629   NITRITE NEGATIVE 05/21/2021 0840   LEUKOCYTESUR LARGE (A) 05/21/2021 0840   Recent Results (from the past 240 hour(s))  Urine Culture     Status: Abnormal   Collection Time: 05/21/21  8:40 AM   Specimen: Urine, Clean Catch  Result Value Ref Range Status   Specimen Description URINE, CLEAN CATCH  Final   Special Requests   Final    NONE Performed at [facility], [address] [place], [place] [address]    Culture (A)  Final    >=100,000 COLONIES/mL MULTIPLE SPECIES PRESENT, SUGGEST RECOLLECTION   Report Status 05/23/2021 FINAL  Final  Resp Panel by RT-PCR (Flu A&B, Covid) Nasopharyngeal Swab     Status: Abnormal   Collection Time: 05/21/21  8:53 PM   Specimen: Nasopharyngeal Swab; Nasopharyngeal(NP) swabs in vial transport medium  Result Value Ref Range Status    SARS Coronavirus 2 by RT PCR POSITIVE (A) NEGATIVE Final    Comment: (NOTE) SARS-CoV-2 target nucleic acids are DETECTED.  The SARS-CoV-2 RNA is generally detectable in upper respiratory specimens during the acute phase of infection. Positive results are indicative of the presence of the identified virus, but do not rule out bacterial infection or co-infection with other pathogens not detected by the test. Clinical correlation with patient history and other diagnostic information is necessary to determine patient infection status. The expected result is Negative.  Fact Sheet for Patients: EntrepreneurPulse.com.au  Fact Sheet for Healthcare Providers: IncredibleEmployment.be  This test is not yet approved or cleared by the [place] FDA and  has been authorized for detection and/or diagnosis of SARS-CoV-2 by FDA under an Emergency Use Authorization (EUA).  This EUA will remain in effect (meaning this test can be used) for the duration of  the COVID-19 declaration under Section 564(b)(1) of the A ct, 21 U.S.C. section 360bbb-3(b)(1), unless the authorization is terminated or revoked sooner.     Influenza A by PCR NEGATIVE NEGATIVE Final   Influenza B by PCR NEGATIVE NEGATIVE Final    Comment: (NOTE) The Xpert Xpress SARS-CoV-2/FLU/RSV plus assay is intended as an aid in the diagnosis of influenza from Nasopharyngeal swab specimens and should not be used as a sole basis for treatment. Nasal washings and aspirates are unacceptable for Xpert Xpress SARS-CoV-2/FLU/RSV testing.  Fact Sheet for Patients: EntrepreneurPulse.com.au  Fact Sheet for Healthcare Providers: IncredibleEmployment.be  This test is not yet approved or cleared by the [place] FDA and has been authorized for detection and/or diagnosis of SARS-CoV-2 by FDA under an Emergency Use Authorization (EUA). This EUA will remain in effect  (meaning this test can be used) for the duration of the COVID-19 declaration under Section 564(b)(1) of the Act, 21 U.S.C. section 360bbb-3(b)(1), unless the authorization is terminated or revoked.  Performed at [facility], [place] [address], [place], [place] [address]   Urine Culture     Status: Abnormal   Collection Time: 05/23/21 11:53 AM   Specimen: Urine, Clean Catch  Result Value Ref Range Status   Specimen Description URINE, CLEAN CATCH  Final   Special Requests   Final    NONE Performed at [facility], [place] [address] [place], [place] [address]    Culture (A)  Final    >=100,000 COLONIES/mL MULTIPLE SPECIES PRESENT, SUGGEST RECOLLECTION   Report Status 05/24/2021 FINAL  Final     US RENAL  Result Date: 05/24/2021 CLINICAL DATA:  AKI. EXAM: RENAL / URINARY TRACT ULTRASOUND COMPLETE COMPARISON:  01/03/2013. FINDINGS: Right Kidney: Renal measurements: 11.5 x 4.8 x 3.9 cm = volume: 113.34 mL. Echogenicity within normal limits. No mass or hydronephrosis  visualized. Left Kidney: Renal measurements: 9.8 x 5.8 x 4.6 cm = volume: 134.38 mL. Echogenicity within normal limits. No mass or hydronephrosis visualized. Bladder: Debris is present in the posterior bladder. Bilateral ureteral jets are noted. Other: None. IMPRESSION: 1. Normal evaluation of the kidneys. 2. Debris in the urinary bladder, possible infectious or inflammatory cystitis. Electronically Signed   By: Brett Fairy M.D.   On: 05/24/2021 00:56   VAS US CAROTID  Result Date: 05/23/2021 Carotid Arterial Duplex Study Patient Name:  MUNIR VICTORIAN  Date of Exam:   05/23/2021 Medical Rec #: [id number]         Accession #:    [id number] Date of Birth: 1934-10-01         Patient Gender: M Patient Age:   87 years Exam Location:  [facility] Procedure:      VAS US CAROTID Referring Phys: Hosie Poisson --------------------------------------------------------------------------------  Indications:       Weakness. Risk  Factors:      Hypertension, hyperlipidemia, Diabetes. Comparison Study:  No prior study Performing Technologist: Maudry Mayhew MHA, RDMS, RVT, RDCS  Examination Guidelines: A complete evaluation includes B-mode imaging, spectral Doppler, color Doppler, and power Doppler as needed of all accessible portions of each vessel. Bilateral testing is considered an integral part of a complete examination. Limited examinations for reoccurring indications may be performed as noted.  Right Carotid Findings: +---------+--------+-------+--------+---------------------------------+--------+            PSV cm/s EDV     Stenosis Plaque Description                Comments                      cm/s                                                         +---------+--------+-------+--------+---------------------------------+--------+  CCA Prox  73       10               smooth and heterogenous                     +---------+--------+-------+--------+---------------------------------+--------+  CCA       58       8                                                             Distal                                                                          +---------+--------+-------+--------+---------------------------------+--------+  ICA Prox  58       12               smooth, heterogenous and calcific           +---------+--------+-------+--------+---------------------------------+--------+  ICA       53       17                                                            Distal                                                                          +---------+--------+-------+--------+---------------------------------+--------+  ECA       106      9                heterogenous, irregular and                                                      calcific                                    +---------+--------+-------+--------+---------------------------------+--------+ +----------+--------+-------+----------------+-------------------+              PSV cm/s EDV cms Describe         Arm Pressure (mmHG)  +----------+--------+-------+----------------+-------------------+  Subclavian 75               Multiphasic, WNL                      +----------+--------+-------+----------------+-------------------+ +---------+--------+--+--------+-+---------+  Vertebral PSV cm/s 43 EDV cm/s 9 Antegrade  +---------+--------+--+--------+-+---------+  Left Carotid Findings: +---------+--------+-------+--------+---------------------------------+--------+            PSV cm/s EDV     Stenosis Plaque Description                Comments                      cm/s                                                         +---------+--------+-------+--------+---------------------------------+--------+  CCA Prox  90       13                                                           +---------+--------+-------+--------+---------------------------------+--------+  CCA       80       13               heterogenous, irregular and                  Distal  calcific                                    +---------+--------+-------+--------+---------------------------------+--------+  ICA Prox  48       12               heterogenous, irregular and                                                      calcific                                    +---------+--------+-------+--------+---------------------------------+--------+  ICA       54       15                                                            Distal                                                                          +---------+--------+-------+--------+---------------------------------+--------+  ECA       76       7                smooth and calcific                         +---------+--------+-------+--------+---------------------------------+--------+ +----------+--------+--------+----------------+-------------------+             PSV cm/s EDV cm/s Describe         Arm Pressure (mmHG)   +----------+--------+--------+----------------+-------------------+  Subclavian 130               Multiphasic, WNL                      +----------+--------+--------+----------------+-------------------+ +---------+--------+--+--------+-+---------+  Vertebral PSV cm/s 42 EDV cm/s 9 Antegrade  +---------+--------+--+--------+-+---------+   Summary: Right Carotid: Velocities in the right ICA are consistent with a 1-39% stenosis. Left Carotid: Velocities in the left ICA are consistent with a 1-39% stenosis. Vertebrals:  Bilateral vertebral arteries demonstrate antegrade flow. Subclavians: Normal flow hemodynamics were seen in bilateral subclavian              arteries. *See table(s) above for measurements and observations.  Electronically signed by Deitra Mayo MD on 05/23/2021 at 4:13:21 PM.    Final     Disposition: Status is: Inpatient Remains inpatient appropriate because: Continue optimization of psychiatric medications and palliative consultation, anticipate DC back to SNF 2/16     Patrecia Pour, MD 05/24/2021 3:38 PM Page by [email or web address]

## 2021-05-24 NOTE — Consult Note (Signed)
Brief Psychiatry Consult Note  In short, this is an 87 y/o pt with a hx of dementia and no other psychiatric illness who presented to the hospital several hours after a stroke. We were consulted for agitation by Dr. Karleen Hampshire in which pt noted to have rigidityu; pt notably received 2 mg IV haldol ~2 hours before initial exam which likely contributed to pt's inattention and sedation.  On repeat exam pt oriented generally to self and situation (knows he is in the hospital for a stroke). He uses cues such as the white board to answer orientation questions, but is able to answer the year and hospital system correctly (although thinks he is in [place]). Vehemently denies SI, HI, AH/VH. Notably less rigidity on exam - no rigidity in hip girdle, some limitations to ROM in u/e which seem to be orthopedic in nature (muscles loose/fluid through limited ROM).    - Deprescribe divalproex; no clear indication and pt with chronic pancreatitis. Unlikely to reach appreciable serum levels or cross BBB at current dosages. - Deprescribe buspirone; no effect on sx - s melatonin 3 mg q 1800h - c  PRN haloperidol to 1 mg and pair with 0.5 mg lorazepam, balancing EPS prevention with deliriogenic effects of lorazepam.  -  pt s/p IV antipsychotic and pavloxid. Last qtc 2/12 454.    We will sign off at this time. This has been communicated to the primary team. If issues arise in the future, don't hesitate to reconsult the Psychiatry Inpatient Consult Service.   [name] A Dyesha Henault

## 2021-05-24 NOTE — Progress Notes (Signed)
Physical Therapy Treatment Patient Details Name: Justin [name] MRN: [id number] DOB: [birth year] Today's Date: 05/24/2021   History of Present Illness Pt is a 87 y/o male admitted 2/12 with R sided weakness. CT negative for acute abnormality, MRI pending. Per neurology presentation consistent with L MCA stroke.  PMH includes: CAD, DM2, prior CVA, HTN, chronic pancreatitis.    PT Comments    Pt received in supine, agreeable to therapy session and with improved participation and tolerance for transfer training and seated/standing LE exercises with emphasis on upright posture and RUE/LE activation. Pt oriented to self only and pleasantly cooperative, with decreased insight into activity tolerance but following 1-step commands well. Pt up in recliner, RN/NT notified to check on him in 1-2 hours as pt may have difficulty using call bell due to cognition. Pt continues to benefit from PT services to progress toward functional mobility goals.    Recommendations for follow up therapy are one component of a multi-disciplinary discharge planning process, led by the attending physician.  Recommendations may be updated based on patient status, additional functional criteria and insurance authorization.  Follow Up Recommendations  Other (comment) (Return to [facility] with PT follow up if possible)     Assistance Recommended at Discharge Frequent or constant Supervision/Assistance  Patient can return home with the following Two people to help with walking and/or transfers;Two people to help with bathing/dressing/bathroom   Equipment Recommendations  None recommended by PT    Recommendations for Other Services       Precautions / Restrictions Precautions Precautions: Fall Restrictions Weight Bearing Restrictions: No     Mobility  Bed Mobility Overal bed mobility: Needs Assistance Bed Mobility: Supine to Sit     Supine to sit: Max assist, +2 for safety/equipment     General bed mobility  comments: following commands to initiate the movement, did better after cues to use bed rail and able to assist with advancing legs and pulling trunk up, needing modA for trunk rise. up to maxA for anterior scooting to foot flat.    Transfers Overall transfer level: Needs assistance Equipment used: Rolling walker (2 wheels), 2 person hand held assist Transfers: Sit to/from Stand, Bed to chair/wheelchair/BSC Sit to Stand: Mod assist, Max assist, +2 physical assistance, +2 safety/equipment, From elevated surface Stand pivot transfers: Mod assist, Max assist, +2 physical assistance         General transfer comment: from higher bed surface needing +2 modA, from lower chair height needing up to +2 maxA and increased time to extend hips/trunk, needs RUE consistent support to stand at sink.    Ambulation/Gait Ambulation/Gait assistance: Max assist, +2 physical assistance Gait Distance (Feet): 5 Feet (7ft, 18ft with seated break) Assistive device: Rolling walker (2 wheels) Gait Pattern/deviations: Step-to pattern, Decreased step length - right, Decreased step length - left, Decreased weight shift to right, Knees buckling, Trunk flexed, Narrow base of support, Shuffle   Gait velocity interpretation: <1.31 ft/sec, indicative of household ambulator Pre-gait activities: standing RLE hip flexion and sidesteps x3 General Gait Details: pt needs totalA for RW management, some manual assist for weight shift and RLE stability/advancement to prevent RLE buckling when stepping with LLE; distance limited due to fatigue.    Modified Rankin (Stroke Patients Only) Modified Rankin (Stroke Patients Only) Pre-Morbid Rankin Score: Severe disability Modified Rankin: Severe disability     Balance Overall balance assessment: Needs assistance Sitting-balance support: No upper extremity supported, Feet supported Sitting balance-Leahy Scale: Fair Sitting balance - Comments: min guard consistently for  safety with  U UE support   Standing balance support: Single extremity supported, During functional activity Standing balance-Leahy Scale: Poor Standing balance comment: can statically stand at the sink with LUE supported and min/modA +1, very poor dynamic standing balance needs +2 mod to maxA with BUE support          Cognition Arousal/Alertness: Awake/alert Behavior During Therapy: Flat affect Overall Cognitive Status: Impaired/Different from baseline Area of Impairment: Orientation, Attention, Memory, Following commands, Safety/judgement, Awareness, Problem solving                 Orientation Level: Disoriented to, Place, Situation, Time Current Attention Level: Focused Memory: Decreased short-term memory Following Commands: Follows one step commands with increased time Safety/Judgement: Decreased awareness of safety, Decreased awareness of deficits Awareness: Intellectual Problem Solving: Slow processing, Decreased initiation, Difficulty sequencing, Requires verbal cues, Requires tactile cues General Comments: follows one step commands with increased time. limited insight. able to state name and birthday, not age. states he is in the hospital in [place] after multiple hints.        Exercises Other Exercises Other Exercises: STS x4 trials (up to 5 minutes at a time), emphasis on midline posture and use of RUE for balance Other Exercises: seated BLE AAROM: hip flexion, LAQ, heel/toe raises x10 reps ea Other Exercises: seated weight shifting and leans with elbow taps x3 reps    General Comments General comments (skin integrity, edema, etc.): HR elevated 108-120 bpm with exertion      Pertinent Vitals/Pain Pain Assessment Pain Assessment: Faces Faces Pain Scale: No hurt Pain Intervention(s): Repositioned, Monitored during session     PT Goals (current goals can now be found in the care plan section) Acute Rehab PT Goals Patient Stated Goal: to be independent PT Goal  Formulation: With patient/family Time For Goal Achievement: 06/05/21 Progress towards PT goals: Progressing toward goals    Frequency    Min 3X/week      PT Plan Current plan remains appropriate    Co-evaluation PT/OT/SLP Co-Evaluation/Treatment: Yes Reason for Co-Treatment: For patient/therapist safety;Necessary to address cognition/behavior during functional activity;To address functional/ADL transfers   OT goals addressed during session: ADL's and self-care      AM-PAC PT "6 Clicks" Mobility   Outcome Measure  Help needed turning from your back to your side while in a flat bed without using bedrails?: A Lot Help needed moving from lying on your back to sitting on the side of a flat bed without using bedrails?: A Lot Help needed moving to and from a bed to a chair (including a wheelchair)?: A Lot Help needed standing up from a chair using your arms (e.g., wheelchair or bedside chair)?: A Lot Help needed to walk in hospital room?: Total Help needed climbing 3-5 steps with a railing? : Total 6 Click Score: 10    End of Session Equipment Utilized During Treatment: Gait belt Activity Tolerance: Patient tolerated treatment well Patient left: in chair;with call bell/phone within reach;with chair alarm set;Other (comment) (lap belt alarm set) Nurse Communication: Mobility status;Other (comment) (use Stedy for pivot back to bed/BSC or +2 with HHA) PT Visit Diagnosis: Unsteadiness on feet (R26.81);Muscle weakness (generalized) (M62.81);Difficulty in walking, not elsewhere classified (R26.2)     Time: 3546-5681 PT Time Calculation (min) (ACUTE ONLY): 32 min  Charges:  $Therapeutic Exercise: 8-22 mins                     Alyxis Grippi P., PTA Acute Rehabilitation Services Pager: [phone]  Office: [place] 05/24/2021, 4:36 PM

## 2021-05-24 NOTE — Assessment & Plan Note (Signed)
Psychiatry consulted. Agitation has improved significantly with their medication changes. They've signed off. Will follow rec's. Palliative care consult also pending.

## 2021-05-24 NOTE — Progress Notes (Addendum)
Progress note:  Consult received and acknowledged.  After reviewing the patient's chart, assessed the patient at bedside.  He had a visitor present during our visit.  Patient was able to verbalize his name and that he had a stroke. He is not as clear on time place or other events.  Given history of dementia, I attempted to speak with patient's family.  I called patient's wife - no answer.  Unable to leave a voicemail.  I called patient's daughter [name].  No answer and HIPAA appropriate voicemail left.  I will continue to attempt to speak with patient and his family regarding palliative needs.  Palliative medicine team will continue to follow patient throughout his hospitalization.  [place] Ilsa Iha, FNP-BC Palliative Medicine Team Team Phone # [phone]  NO CHARGE

## 2021-05-24 NOTE — NC FL2 (Signed)
[place] LEVEL OF CARE SCREENING TOOL     IDENTIFICATION  Patient Name: Justin [name] Birthdate: 1934/12/20 Sex: male Admission Date (Current Location): 05/21/2021  [facility] and [place] Number:  Herbalist and Address:  [facility], [place] [address] [place], [place] [address]      Provider Number: [id number]  Attending Physician Name and Address:  Patrecia Pour, MD  Relative Name and Phone Number:       Current Level of Care: Hospital Recommended Level of Care: [place] Prior Approval Number:    Date Approved/Denied:   PASRR Number: [id number]  Discharge Plan: SNF    Current Diagnoses: Patient Active Problem List   Diagnosis Date Noted   Acute metabolic encephalopathy 08/67/6195   Acute ischemic stroke ([place]) 05/21/2021   Chronic kidney disease, stage 3b ([place]) 05/21/2021   Mixed diabetic hyperlipidemia associated with type 2 diabetes mellitus ([place]) 05/21/2021   Polyneuropathy due to type 2 diabetes mellitus ([place]) 05/21/2021   Leukocytosis 05/21/2021   COVID-19 virus infection 05/21/2021   Type 2 diabetes mellitus with stage 3b chronic kidney disease, with long-term current use of insulin ([place]) 09/29/2015   Arthritis 09/06/2015   Diabetic peripheral neuropathy ([place]) 08/17/2015   OA (osteoarthritis) of knee 08/17/2015   Primary osteoarthritis of both knees 08/11/2015   Primary osteoarthritis of left hip 08/11/2015   Hammer toe, acquired 09/30/2013   Personal history of colonic adenomas 04/15/2013   Chronic pancreatitis ([place]) 02/26/2013   Anemia of chronic disease 02/26/2013   Goals of care, counseling/discussion 02/26/2013   Essential hypertension 12/14/2012   Testosterone deficiency 10/22/2011   Headache above the eye region 02/22/2011   Type 2 diabetes mellitus with diabetic nephropathy, with long-term current use of insulin ([place]) 02/22/2011   TIA on medication 02/22/2011   Coronary artery disease  02/22/2011    Orientation RESPIRATION BLADDER Height & Weight     Self  Normal Incontinent Weight: 171 lb 15.3 oz (78 kg) Height:  6' (182.9 cm)  BEHAVIORAL SYMPTOMS/MOOD NEUROLOGICAL BOWEL NUTRITION STATUS      Incontinent Diet (heart healthy)  AMBULATORY STATUS COMMUNICATION OF NEEDS Skin   Extensive Assist Verbally Normal                       Personal Care Assistance Level of Assistance  Bathing, Feeding, Dressing Bathing Assistance: Maximum assistance Feeding assistance: Maximum assistance Dressing Assistance: Maximum assistance     Functional Limitations Info  Speech     Speech Info: Impaired    SPECIAL CARE FACTORS FREQUENCY                       Contractures Contractures Info: Not present    Additional Factors Info  Code Status, Allergies Code Status Info: DNR Allergies Info: NKA           Current Medications (05/24/2021):  This is the current hospital active medication list Current Facility-Administered Medications  Medication Dose Route Frequency Provider Last Rate Last Admin    stroke: mapping our early stages of recovery book   Does not apply Once Shalhoub, Sherryll Burger, MD       acetaminophen (TYLENOL) tablet 650 mg  650 mg Oral Q6H PRN Shalhoub, Sherryll Burger, MD       Or   acetaminophen (TYLENOL) suppository 650 mg  650 mg Rectal Q6H PRN Shalhoub, Sherryll Burger, MD       albuterol (VENTOLIN HFA)  108 (90 Base) MCG/ACT inhaler 2 puff  2 puff Inhalation Q4H PRN Shalhoub, Sherryll Burger, MD       ascorbic acid (VITAMIN C) tablet 500 mg  500 mg Oral Daily Shalhoub, Sherryll Burger, MD   500 mg at 05/24/21 0930   aspirin chewable tablet 81 mg  81 mg Oral Daily Shalhoub, Sherryll Burger, MD   81 mg at 05/24/21 0930   [START ON 05/27/2021] atorvastatin (LIPITOR) tablet 80 mg  80 mg Oral Daily Rosezetta Schlatter, MD       azelastine (ASTELIN) 0.1 % nasal spray 2 spray  2 spray Each Nare BID Vernelle Emerald, MD   2 spray at 05/24/21 8182   benzonatate (TESSALON) capsule 100 mg  100  mg Oral TID PRN Vernelle Emerald, MD       cefTRIAXone (ROCEPHIN) 1 g in sodium chloride 0.9 % 100 mL IVPB  1 g Intravenous Q24H Hosie Poisson, MD 200 mL/hr at 05/23/21 2339 1 g at 05/23/21 2339   clopidogrel (PLAVIX) tablet 75 mg  75 mg Oral Daily Shalhoub, Sherryll Burger, MD   75 mg at 05/24/21 0930   dextrose 5 %-0.9 % sodium chloride infusion   Intravenous Continuous Hosie Poisson, MD 50 mL/hr at 05/23/21 1753 New Bag at 05/23/21 1753   enoxaparin (LOVENOX) injection 40 mg  40 mg Subcutaneous Q24H Vernelle Emerald, MD   40 mg at 05/23/21 2242   fluticasone furoate-vilanterol (BREO ELLIPTA) 100-25 MCG/ACT 1 puff  1 puff Inhalation Daily Shalhoub, Sherryll Burger, MD   1 puff at 05/24/21 0801   gabapentin (NEURONTIN) capsule 100 mg  100 mg Oral Daily Vernelle Emerald, MD   100 mg at 05/24/21 0931   gabapentin (NEURONTIN) capsule 400 mg  400 mg Oral QHS Vernelle Emerald, MD   400 mg at 05/23/21 2242   haloperidol lactate (HALDOL) injection 1 mg  1 mg Intravenous Q6H PRN Cinderella, Margaret A       And   LORazepam (ATIVAN) injection 0.5 mg  0.5 mg Intravenous Q6H PRN Cinderella, Margaret A       hydrALAZINE (APRESOLINE) injection 10 mg  10 mg Intravenous Q6H PRN Shalhoub, Sherryll Burger, MD       insulin aspart (novoLOG) injection 0-15 Units  0-15 Units Subcutaneous TID AC & HS Shalhoub, Sherryll Burger, MD   2 Units at 05/24/21 1153   isosorbide dinitrate (ISORDIL) tablet 10 mg  10 mg Oral QHS Vernelle Emerald, MD   10 mg at 05/23/21 2241   lipase/protease/amylase (CREON) capsule 24,000 Units  24,000 Units Oral TID AC Shalhoub, Sherryll Burger, MD   24,000 Units at 05/24/21 1154   loratadine (CLARITIN) tablet 10 mg  10 mg Oral Daily Shalhoub, Sherryll Burger, MD   10 mg at 05/24/21 0930   melatonin tablet 3 mg  3 mg Oral q1800 Cinderella, Margaret A   3 mg at 05/23/21 1753   multivitamin with minerals tablet 1 tablet  1 tablet Oral Daily Shalhoub, Sherryll Burger, MD   1 tablet at 05/24/21 0930   ondansetron (ZOFRAN) tablet 4 mg  4  mg Oral Q6H PRN Vernelle Emerald, MD       Or   ondansetron [facility]) injection 4 mg  4 mg Intravenous Q6H PRN Shalhoub, Sherryll Burger, MD       pantoprazole (PROTONIX) EC tablet 40 mg  40 mg Oral Daily Shalhoub, Sherryll Burger, MD   40 mg at 05/24/21 0930   polyethylene glycol (MIRALAX /  GLYCOLAX) packet 17 g  17 g Oral Daily PRN Shalhoub, Sherryll Burger, MD       zinc sulfate capsule 220 mg  220 mg Oral Daily Shalhoub, Sherryll Burger, MD   220 mg at 05/24/21 0930     Discharge Medications: Please see discharge summary for a list of discharge medications.  Relevant Imaging Results:  Relevant Lab Results:   Additional Information SS#: [id number]  Geralynn Ochs, LCSW

## 2021-05-24 NOTE — Progress Notes (Signed)
Occupational Therapy Treatment Patient Details Name: Justin [name] MRN: [id number] DOB: [birth year] Today's Date: 05/24/2021   History of present illness Pt is a 87 y/o male admitted 2/12 with R sided weakness. CT negative for acute abnormality, MRI pending. Per neurology presentation consistent with L MCA stroke.  PMH includes: CAD, DM2, prior CVA, HTN, chronic pancreatitis.   OT comments  Justin [name] is progressing towards his acute goals. He followed most commands this session with increased time. Pt moving RUE against gravity this session with max cues, hand over hand given for grooming tasks at the sink as well as grasp on RW. Pt tolerated 3x short ambulation with fluctuating assistance levels from mod A - Max A +2 for RW management, balance, sequencing and RUE. Left sitting in chair with posey belt. D/c remains appropriate, OT to continue to follow acutely.    Recommendations for follow up therapy are one component of a multi-disciplinary discharge planning process, led by the attending physician.  Recommendations may be updated based on patient status, additional functional criteria and insurance authorization.    Follow Up Recommendations  Skilled nursing-short term rehab (<3 hours/day)    Assistance Recommended at Discharge Frequent or constant Supervision/Assistance  Patient can return home with the following  Two people to help with walking and/or transfers;Two people to help with bathing/dressing/bathroom   Equipment Recommendations  Other (comment)       Precautions / Restrictions Precautions Precautions: Fall Restrictions Weight Bearing Restrictions: No       Mobility Bed Mobility Overal bed mobility: Needs Assistance Bed Mobility: Supine to Sit     Supine to sit: Max assist     General bed mobility comments: following commands to initiate the movement but ultimately max A    Transfers Overall transfer level: Needs assistance Equipment used: Rolling walker (2  wheels), 2 person hand held assist Transfers: Sit to/from Stand Sit to Stand: Mod assist, Max assist           General transfer comment: mod A x2, max A x1 with fatigue     Balance Overall balance assessment: Needs assistance Sitting-balance support: No upper extremity supported, Feet supported Sitting balance-Leahy Scale: Fair     Standing balance support: Single extremity supported, During functional activity Standing balance-Leahy Scale: Fair Standing balance comment: can statically stand at the sink with LUE supported                           ADL either performed or assessed with clinical judgement   ADL Overall ADL's : Needs assistance/impaired     Grooming: Oral care;Wash/dry hands;Sitting;Standing;Maximal assistance Grooming Details (indicate cue type and reason): applied tooth past to tooth brush with R hand over hand. washed hands in standing                 Toilet Transfer: Moderate assistance;+2 for physical assistance;+2 for safety/equipment;Maximal assistance Toilet Transfer Details (indicate cue type and reason): simulated, bed>chair and ambulating in the room. mod A +2 initally increased assist after fatigue         Functional mobility during ADLs: Maximal assistance;Moderate assistance;+2 for safety/equipment;+2 for physical assistance General ADL Comments: limited by R weakness, attention, fatigue, balance and impaired cog    Extremity/Trunk Assessment Upper Extremity Assessment RUE Deficits / Details: 3/5 globally. requires cues for all movement and attention to R. hand over hand for grooming RUE Sensation: decreased light touch RUE Coordination: decreased fine motor;decreased gross motor   Lower  Extremity Assessment Lower Extremity Assessment: Defer to PT evaluation        Vision   Vision Assessment?: No apparent visual deficits Additional Comments: some R inattention noted   Perception Perception Perception: Not tested    Praxis Praxis Praxis: Not tested    Cognition Arousal/Alertness: Awake/alert Behavior During Therapy: Flat affect Overall Cognitive Status: Impaired/Different from baseline Area of Impairment: Orientation, Attention, Memory, Following commands, Safety/judgement, Awareness, Problem solving                 Orientation Level: Disoriented to, Place, Situation, Time Current Attention Level: Focused Memory: Decreased short-term memory Following Commands: Follows one step commands with increased time Safety/Judgement: Decreased awareness of safety, Decreased awareness of deficits Awareness: Intellectual Problem Solving: Slow processing, Decreased initiation, Difficulty sequencing, Requires verbal cues, Requires tactile cues General Comments: follows one step commands with increased time. limited insight. able to state name and birthday, not age. states he is in the hospital in [place] Comments VSS onRA    Pertinent Vitals/ Pain       Pain Assessment Pain Assessment: Faces Faces Pain Scale: No hurt Pain Intervention(s): Monitored during session         Frequency  Min 2X/week        Progress Toward Goals  OT Goals(current goals can now be found in the care plan section)  Progress towards OT goals: Progressing toward goals  Acute Rehab OT Goals Time For Goal Achievement: 06/05/21 Potential to Achieve Goals: Fair ADL Goals Pt Will Perform Grooming: with min assist;sitting Pt Will Perform Upper Body Bathing: with min assist;sitting Pt Will Transfer to Toilet: with mod assist;with +2 assist;bedside commode;stand pivot transfer Pt/caregiver will Perform Home Exercise Program: Increased ROM;Increased strength;Right Upper extremity;With minimal assist Additional ADL Goal #1: Pt will follow 1 step commands with 90% accuarcy during ADL routine.  Plan Discharge plan remains appropriate    Co-evaluation    PT/OT/SLP Co-Evaluation/Treatment:  Yes Reason for Co-Treatment: Complexity of the patient's impairments (multi-system involvement);For patient/therapist safety;To address functional/ADL transfers   OT goals addressed during session: ADL's and self-care      AM-PAC OT "6 Clicks" Daily Activity     Outcome Measure   Help from another person eating meals?: A Lot Help from another person taking care of personal grooming?: A Lot Help from another person toileting, which includes using toliet, bedpan, or urinal?: A Lot Help from another person bathing (including washing, rinsing, drying)?: A Lot Help from another person to put on and taking off regular upper body clothing?: A Lot Help from another person to put on and taking off regular lower body clothing?: A Lot 6 Click Score: 12    End of Session Equipment Utilized During Treatment: Gait belt;Rolling walker (2 wheels)  OT Visit Diagnosis: Other abnormalities of gait and mobility (R26.89);Muscle weakness (generalized) (M62.81);Hemiplegia and hemiparesis;Other symptoms and signs involving cognitive function Hemiplegia - Right/Left: Right Hemiplegia - dominant/non-dominant: Dominant Hemiplegia - caused by: Unspecified   Activity Tolerance Patient tolerated treatment well   Patient Left in chair;with call bell/phone within reach;with chair alarm set   Nurse Communication Mobility status        Time: 1497-0263 OT Time Calculation (min): 33 min  Charges: OT General Charges $OT Visit: 1 Visit OT Treatments $Therapeutic Activity: 8-22 mins   Tysin Salada A Rosa [place] 05/24/2021, 4:21 PM

## 2021-05-24 NOTE — Care Management Important Message (Signed)
Important Message  Patient Details  Name: Justin [name] MRN: [id number] Date of Birth: December 26, 1934   Medicare Important Message Given:  Yes Patient was not able to sign due to illness he gave verbal consent and a signed copy was left at the patient bedside.     Jasmina Gendron 05/24/2021, 1:36 PM

## 2021-05-24 NOTE — TOC Initial Note (Signed)
Transition of Care [facility]) - Initial/Assessment Note    Patient Details  Name: Justin [name] MRN: [id number] Date of Birth: 19-Feb-1935  Transition of Care [facility]) CM/SW Contact:    Geralynn Ochs, LCSW Phone Number: 05/24/2021, 4:01 PM  Clinical Narrative:            CSW met with patient's daughter at bedside to discuss return to SNF and her concerns about care at [facility]. Per daughter, patient and spouse both live at [facility], but she has concerns about the patient's care and that they feed him sweets that he shouldn't have with his diabetes. Daughter asked about other options, but CSW informed her that there wouldn't be any other long term care options available. Daughter asking for palliative consult, so that another set of eyes can go and check on the patient and keep his best interests at heart. CSW to provide referral to [facility] for follow up at [facility]. CSW updated [facility] and will follow for DC back to SNF when medically ready.       Expected Discharge Plan: Skilled Nursing Facility Barriers to Discharge: Continued Medical Work up   Patient Goals and CMS Choice Patient states their goals for this hospitalization and ongoing recovery are:: patient unable to participate in goal setting, not oriented CMS Medicare.gov Compare Post Acute Care list provided to:: Patient Represenative (must comment) Choice offered to / list presented to : Adult Children  Expected Discharge Plan and Services Expected Discharge Plan: [place] Acute Care Choice: [place] arrangements for the past 2 months: [place]                                      Prior Living Arrangements/Services Living arrangements for the past 2 months: [place] Lives with:: Facility Resident Patient language and need for interpreter reviewed:: No Do you feel safe going back to the place where you live?: Yes      Need for Family  Participation in Patient Care: Yes (Comment) Care giver support system in place?: Yes (comment)   Criminal Activity/Legal Involvement Pertinent to Current Situation/Hospitalization: No - Comment as needed  Activities of Daily Living Home Assistive Devices/Equipment: Wheelchair ADL Screening (condition at time of admission) Patient's cognitive ability adequate to safely complete daily activities?: No Is the patient deaf or have difficulty hearing?: Yes Does the patient have difficulty seeing, even when wearing glasses/contacts?: Yes Does the patient have difficulty concentrating, remembering, or making decisions?: Yes Patient able to express need for assistance with ADLs?: No Does the patient have difficulty dressing or bathing?: Yes Independently performs ADLs?: No Does the patient have difficulty walking or climbing stairs?: Yes Weakness of Legs: Both Weakness of Arms/Hands: Both  Permission Sought/Granted Permission sought to share information with : Facility Sport and exercise psychologist, Family Supports Permission granted to share information with : Yes, Verbal Permission Granted  Share Information with NAME: [name]  Permission granted to share info w AGENCY: [place] granted to share info w Relationship: Daughter     Emotional Assessment Appearance:: Appears stated age Attitude/Demeanor/Rapport: Unable to Assess Affect (typically observed): Unable to Assess Orientation: : Oriented to Self Alcohol / Substance Use: Not Applicable Psych Involvement: Yes (comment)  Admission diagnosis:  Acute ischemic stroke ([place]) [I63.9] Encounter for imaging to screen for metal prior to magnetic resonance imaging (MRI) [Z13.89] Cerebrovascular accident (  CVA), unspecified mechanism ([place]) [I63.9] Patient Active Problem List   Diagnosis Date Noted   Acute metabolic encephalopathy 19/50/9326   Acute ischemic stroke ([place]) 05/21/2021   Chronic kidney disease, stage 3b ([place]) 05/21/2021    Mixed diabetic hyperlipidemia associated with type 2 diabetes mellitus ([place]) 05/21/2021   Polyneuropathy due to type 2 diabetes mellitus ([place]) 05/21/2021   Leukocytosis 05/21/2021   COVID-19 virus infection 05/21/2021   Type 2 diabetes mellitus with stage 3b chronic kidney disease, with long-term current use of insulin ([place]) 09/29/2015   Arthritis 09/06/2015   Diabetic peripheral neuropathy ([place]) 08/17/2015   OA (osteoarthritis) of knee 08/17/2015   Primary osteoarthritis of both knees 08/11/2015   Primary osteoarthritis of left hip 08/11/2015   Hammer toe, acquired 09/30/2013   Personal history of colonic adenomas 04/15/2013   Chronic pancreatitis ([place]) 02/26/2013   Anemia of chronic disease 02/26/2013   Goals of care, counseling/discussion 02/26/2013   Essential hypertension 12/14/2012   Testosterone deficiency 10/22/2011   Headache above the eye region 02/22/2011   Type 2 diabetes mellitus with diabetic nephropathy, with long-term current use of insulin ([place]) 02/22/2011   TIA on medication 02/22/2011   Coronary artery disease 02/22/2011   PCP:  [facility] [place]:   [place], [place] - [address] [address] Phone: [phone] Fax: [phone]  [place], [address] [place] [address] Phone: [phone] Fax: [phone]  [place] [address] [place], [place] - [address] [address] Phone: [phone] Fax: [phone]     Social Determinants of Health (SDOH) Interventions    Readmission Risk Interventions No flowsheet data found.

## 2021-05-24 NOTE — Care Management Important Message (Signed)
Important Message  Patient Details  Name: Justin [name] MRN: [id number] Date of Birth: 21-Jun-1934   Medicare Important Message Given:  Yes     Orbie Pyo 05/24/2021, 1:35 PM

## 2021-05-25 DIAGNOSIS — Z66 Do not resuscitate: Secondary | ICD-10-CM

## 2021-05-25 DIAGNOSIS — N179 Acute kidney failure, unspecified: Secondary | ICD-10-CM

## 2021-05-25 DIAGNOSIS — G9341 Metabolic encephalopathy: Secondary | ICD-10-CM

## 2021-05-25 LAB — GLUCOSE, CAPILLARY
Glucose-Capillary: 174 mg/dL — ABNORMAL HIGH (ref 70–99)
Glucose-Capillary: 163 mg/dL — ABNORMAL HIGH (ref 70–99)
Glucose-Capillary: 201 mg/dL — ABNORMAL HIGH (ref 70–99)
Glucose-Capillary: 189 mg/dL — ABNORMAL HIGH (ref 70–99)

## 2021-05-25 MED ORDER — IPRATROPIUM-ALBUTEROL 20-100 MCG/ACT IN AERS: 1.0000 | INHALATION_SPRAY | Freq: Four times a day (QID) | RESPIRATORY_TRACT | Status: AC | PRN

## 2021-05-25 MED ORDER — ATORVASTATIN CALCIUM 80 MG PO TABS: 80.0000 mg | ORAL_TABLET | Freq: Every day | ORAL | 0 refills | Status: DC

## 2021-05-25 MED ORDER — FOSFOMYCIN TROMETHAMINE 3 G PO PACK
3.0000 g | PACK | Freq: Once | ORAL | Status: AC
  Administered 2021-05-25: 3 g via ORAL
  Filled 2021-05-25: qty 3

## 2021-05-25 MED ORDER — CLOPIDOGREL BISULFATE 75 MG PO TABS: 75.0000 mg | ORAL_TABLET | Freq: Every day | ORAL | 0 refills | Status: AC

## 2021-05-25 NOTE — TOC Transition Note (Signed)
Transition of Care [facility]) - CM/SW Discharge Note   Patient Details  Name: Justin [name] MRN: [id number] Date of Birth: 12-Mar-1935  Transition of Care [facility]) CM/SW Contact:  Geralynn Ochs, LCSW Phone Number: 05/25/2021, 11:59 AM   Clinical Narrative:   CSW sent discharge summary to [facility], confirmed bed availability. [place] asked for insurance auth to be started for therapy for patient. CSW attempted to submit to [employer] portal, but could not locate patient. CSW contacted [employer] and patient's UHC is not managed by [employer]. CSW updated [facility], they will initiate insurance auth. CSW left a voicemail for daughter, [name], to update. Transport scheduled with [facility] for next available.  Nurse to call report to [phone].     Final next level of care: Skilled Nursing Facility Barriers to Discharge: Barriers Resolved   Patient Goals and CMS Choice Patient states their goals for this hospitalization and ongoing recovery are:: patient unable to participate in goal setting, not oriented CMS Medicare.gov Compare Post Acute Care list provided to:: Patient Represenative (must comment) Choice offered to / list presented to : Adult Children  Discharge Placement              Patient [facility] bed at: [facility] Patient to be transferred to facility by: [place] Name of family member notified: [name], left a voicemail Patient and family notified of of transfer: 05/25/21  Discharge Plan and Services     Post Acute Care Choice: [place]                               Social Determinants of Health (SDOH) Interventions     Readmission Risk Interventions No flowsheet data found.

## 2021-05-25 NOTE — Consult Note (Signed)
Consultation Note Date: 05/25/2021   Patient Name: Justin [name]  DOB: 05-27-1934  MRN: [id number]  Age / Sex: 87 y.o., male  PCP: [facility] Referring Physician: Patrecia Pour, MD  Reason for Consultation: Establishing goals of care  HPI/Patient Profile: Patient is an [age] year old male with a past medical history significant for dementia, CAD, GERD, type 2 diabetes with polyneuropathy and long-term insulin use, prior strokes, HLD, HTN, chronic pancreatitis, and CKD stage IIIb admitted on 2/12 with new onset of right-sided weakness and right facial droop.  CT of head on admission revealed no new intracranial abnormalities.  Patient found to have multiple bacterial species and urinalysis and IV antibiotics were started.  Clinical Assessment and Goals of Care: I have reviewed medical records including EPIC notes, labs and imaging, assessed the patient and then met with patient and his daughter [name] at bedside to discuss diagnosis prognosis, [place], EOL wishes, disposition and options.  I introduced Palliative Medicine as specialized medical care for people living with serious illness. It focuses on providing relief from the symptoms and stress of a serious illness. The goal is to improve quality of life for both the patient and the family.  We discussed a brief life review of the patient.  [name] shares patient has been married for over 54 years.  She shares he is a strong-willed man.  [name] shares she is the one who takes care of both her father and her mother.  Patient also has 2 nieces that are registered nurses and are heavily involved with his care.  As far as functional and nutritional status, shares patient was in a wheelchair due to wounds on his lower extremities.  She shares he is able to feed himself and has not had decreased appetite prior to admission.  We discussed patient's current illness  and what it means in the larger context of patient's on-going co-morbidities. I attempted to elicit values and goals of care important to the patient.  Patient shared she just wants to go home.  [name] shares she would like to know what the cause of the stroke was entered on the results of his tests prior to him being discharged.  I reviewed the results of MRI, blood work, and CT scans with daughter.  We discussed proper diabetic management and minimizing risk factors that contribute to a stroke.   She shared she would like to speak with his doctor directly to get a better picture of what happened with her father. Dr. Bonner Puna made aware via secure chat that family would like to speak with him directly regarding patient's diagnoses and comorbidities.  Discussed with patient/family the importance of continued conversation with family and the medical providers regarding overall plan of care and treatment options, ensuring decisions are within the context of the patients values and GOCs.    Palliative Care services outpatient were explained and offered.  Daughter was in agreement and very appreciative to have this extra layer of support.   Questions and concerns were addressed. The  family was encouraged to call with questions or concerns.   Primary Decision Maker NEXT OF KIN  Code Status/Advance Care Planning: DNR  Prognosis:   Unable to determine  Discharge Planning: Return to [facility] with Palliative Outpatient Services  Primary Diagnoses: Present on Admission:  Acute ischemic stroke ([place])  Chronic kidney disease, stage 3b ([place])  Essential hypertension  Mixed diabetic hyperlipidemia associated with type 2 diabetes mellitus ([place])  Polyneuropathy due to type 2 diabetes mellitus ([place])  Leukocytosis  Coronary artery disease  Chronic pancreatitis ([place])  COVID-19 virus infection   Physical Exam Vitals and nursing note reviewed.  HENT:     Head: Normocephalic.     Mouth/Throat:      Mouth: Mucous membranes are moist.  Eyes:     Pupils: Pupils are equal, round, and reactive to light.  Cardiovascular:     Rate and Rhythm: Normal rate.  Pulmonary:     Effort: Pulmonary effort is normal.  Abdominal:     Palpations: Abdomen is soft.  Musculoskeletal:     Comments: Right sided weakness  Skin:    General: Skin is warm.  Neurological:     Mental Status: He is alert. Mental status is at baseline.  Psychiatric:        Mood and Affect: Mood normal.        Behavior: Behavior normal.        Thought Content: Thought content normal.    Palliative Assessment/Data: 40%     I discussed this patient's plan of care with patient, patient's daughter, RN Alexa, Dr. Bonner Puna.  Thank you for this consult. Palliative medicine will continue to follow and assist holistically.   Time Total: 80 minutes Greater than 50%  of this time was spent counseling and coordinating care related to the above assessment and plan.  Signed by: Jordan Hawks, DNP, FNP-BC Palliative Medicine    Please contact Palliative Medicine Team phone at [phone] for questions and concerns.  For individual provider: See Shea Evans

## 2021-05-25 NOTE — Discharge Summary (Signed)
Physician Discharge Summary   Patient: Justin [name] MRN: [id number] DOB: 1934/08/15  Admit date:     05/21/2021  Discharge date: 05/25/21  Discharge Physician: Patrecia Pour   PCP: [place]   Recommendations at discharge:  Continue discussions with palliative care after discharge. Continue DAPT for 3 weeks, then aspirin alone, and follow up with neurology in 6-8 weeks for post-stroke care.   Discharge Diagnoses: Principal Problem:   Acute ischemic stroke [facility]) Active Problems:   Coronary artery disease   Essential hypertension   Chronic pancreatitis ([place])   Goals of care, counseling/discussion   Type 2 diabetes mellitus with [name] 3b chronic kidney disease, with long-term current use of insulin ([name])   Chronic kidney disease, [name] 3b ([place])   Mixed diabetic hyperlipidemia associated with type 2 diabetes mellitus ([place])   Polyneuropathy due to type 2 diabetes mellitus ([name])   Leukocytosis   COVID-19 virus infection   Acute metabolic encephalopathy  Hospital Course: Justin [name] is an 87 year old male with past medical history of coronary artery disease, gastroesophageal reflux disease, diabetes mellitus type 2, prior stroke, hyperlipidemia, hypertension and chronic pancreatitis who presents to [facility] emergency department from [place] facility via EMS due to right-sided weakness with concerns for stroke. MRI revealed strokes as denoted below for which neurology was consulted. DAPT for 3 weeks, then aspirin alone, and continuation of statin, in addition to risk factor optimization is recommended. The patient had episodes of agitation and acute encephalopathy for which psychiatry was consulted. The patient's agitation has resolved and mental status dramatically improved. Bacteriuria with leukocytosis was treated as UTI and has completed treatment during hospitalization. Please see below for further details. The patient is medically  stabilized to return to SNF.  Assessment and Plan: * Acute ischemic stroke ([place])- (present on admission) Patient presenting with new onset right-sided weakness and right facial droop concerning for acute ischemic stroke. MRI of the brain showed Moderately motion study with acute infarcts in the left perirolandic frontoparietal cortex and smaller acute infarcts in bilateral parietal white matter. MRA head and neck does not show any large vessel occlusion.  - Per neurology > aspirin and plavix for 3 weeks, then aspirin alone.  - LDL 85 > increase atorvastatin 68m > 871m - Continue PT/OT at SNF - Risk factor optimization as below  Acute metabolic encephalopathy/agitation: Psychiatry consulted. Agitation has improved significantly.  - Will continue pt's home buspar at discharge as his mental status is likely to improve once returning to familiar environment. This could be considered for deprescribing going forward.  - Suggest stopping depakote since pt has chronic pancreatitis and low dose is unlikely to cross BBB (per psychiatry).   COVID-19 virus infection- (present on admission) Incidental finding with COVID-19 PCR being positive prior to arrival, no longer requires isolation or treatment. Did receive a few doses of paxlovid which has been stopped, especially in light of plavix use.  Leukocytosis- (present on admission) Continued ceftriaxone with polymicrobial growth on urine culture without alternative source. Will complete treatment with a single dose of fosfomycin on 2/16 given no clonal data to go by. Leukocytosis has resolved.   Polyneuropathy due to type 2 diabetes mellitus ([facility] (present on admission) - Continue home regimen of gabapentin  Mixed diabetic hyperlipidemia associated with type 2 diabetes mellitus ([facility] (present on admission) Hemoglobin A1c is 7.6% and CBGs well controlled during admission. Continue home med.  Chronic kidney disease, [name] 3b ([facility] (present on  admission) Creatinine  seems to have risen to 1.83, up from 1.36 2 years ago however I presume this is progression of chronic kidney disease and not acute kidney injury. - Minimizing nephrotoxic agents as much as possible - Continue to renal parameters.   Type 2 diabetes mellitus with [name] 3b chronic kidney disease, with long-term current use of insulin ([place]) - Will restart home therapies at discharge. Will DC metformin with CKD.  Goals of care, counseling/discussion - All patient historically has had a MOST form that mentions that patient should not be hospitalized for illnesses. Patient's daughter who is HCPOA states that this is not valid and that she wishes for her father to be admitted for this acute stroke and receive all appropriate medical care. I was unable to reach HCPOA by phone.  - She does confirm however that he should be DNR/DNI  Chronic pancreatitis ([place])- (present on admission): No acute symptoms. - Continue home regimen of pancreatic supplements  Essential hypertension- (present on admission) - Stable.   Coronary artery disease- (present on admission) No evidence of angina. Continue Tx as above.   Anemia of chronic kidney disease and iron deficiency:  - Stable, continue po iron.  COPD: No exacerbation, continue home meds.   Consultants: Neurology Procedures performed: Echo  Code status: DNR Disposition: Skilled nursing facility Diet recommendation:  Cardiac and Carb modified diet  DISCHARGE MEDICATION: Allergies as of 05/25/2021   No Known Allergies      Medication List     STOP taking these medications    divalproex 125 MG DR tablet Commonly known as: DEPAKOTE   fish oil-omega-3 fatty acids 1000 MG capsule   glipiZIDE 2.5 MG 24 hr tablet Commonly known as: GLUCOTROL XL   insulin aspart protamine- aspart (70-30) 100 UNIT/ML injection Commonly known as: NovoLOG Mix 70/30   l-methylfolate-B6-B12 3-35-2 MG Tabs tablet Commonly known as: METANX    lidocaine 5 % ointment Commonly known as: XYLOCAINE   metFORMIN 1000 MG tablet Commonly known as: GLUCOPHAGE   multivitamin with minerals tablet   traMADol 50 MG tablet Commonly known as: ULTRAM   zinc sulfate 220 (50 Zn) MG capsule       TAKE these medications    Accu-Chek Aviva Plus w/Device Kit Use to check blood sugar 3 times daily. Dx: E10.9   Accu-Chek Aviva Soln Use to check blood sugar 3 times daily. Dx: E10.9   accu-chek soft touch lancets Use to check blood sugar 3 times daily. Dx: E10.9   acetaminophen 325 MG tablet Commonly known as: TYLENOL Take 650 mg by mouth daily.   Alcohol Swabs Pads Use to check blood sugar 3 times daily. Dx: E10.9   aspirin 81 MG chewable tablet Chew 81 mg by mouth daily.   atorvastatin 80 MG tablet Commonly known as: LIPITOR Take 1 tablet (80 mg total) by mouth daily. What changed:  medication strength how much to take   azelastine 0.1 % nasal spray Commonly known as: ASTELIN Place 2 sprays into both nostrils 2 (two) times daily. Use in each nostril as directed   busPIRone 15 MG tablet Commonly known as: BUSPAR Take 15 mg by mouth 2 (two) times daily as needed (anxiety).   cholecalciferol 25 MCG (1000 UNIT) tablet Commonly known as: VITAMIN D3 Take 1,000 Units by mouth daily.   clopidogrel 75 MG tablet Commonly known as: PLAVIX Take 1 tablet (75 mg total) by mouth daily for 21 days.   ferrous sulfate 325 (65 FE) MG tablet Take 325 mg by mouth  daily with breakfast.   fluticasone furoate-vilanterol 100-25 MCG/ACT Aepb Commonly known as: BREO ELLIPTA Inhale 1 puff into the lungs daily.   gabapentin 100 MG capsule Commonly known as: NEURONTIN Take 100 mg by mouth daily. **2 capsules once daily**   gabapentin 400 MG capsule Commonly known as: NEURONTIN Take 400 mg by mouth at bedtime.   glucagon 1 MG injection Inject 1 mg into the muscle once as needed.   glucose blood test strip Commonly known as:  Accu-Chek Aviva Plus Use to check blood sugar 3 times daily. Dx: E10.9   guaiFENesin 100 MG/5ML liquid Commonly known as: ROBITUSSIN Take 5 mLs by mouth every 6 (six) hours as needed for cough or to loosen phlegm.   guaifenesin 400 MG Tabs tablet Commonly known as: HUMIBID E Take 400 mg by mouth every 6 (six) hours as needed (congestion).   HumaLOG KwikPen 100 UNIT/ML KwikPen Generic drug: insulin lispro Inject 12 Units into the skin 4 (four) times daily -  before meals and at bedtime. If BS is less than 70 call NP/PA  BS 70-100 0 units  BS 201-250 2 units  BS 251-300 4 units  BS 301-350 6 UNITS  BS 351-400 8 UNITS  BS 401-450 10 UNITS  BS 451-600 12 UNITS   insulin glargine 100 UNIT/ML injection Commonly known as: LANTUS Inject 38 Units into the skin daily. What changed: Another medication with the same name was removed. Continue taking this medication, and follow the directions you see here.   Insulin Pen Needle 32G X 4 MM Misc Use as directed to inject insulin tidwc   Ipratropium-Albuterol 20-100 MCG/ACT Aers respimat Commonly known as: COMBIVENT Inhale 1 puff into the lungs every 6 (six) hours as needed for wheezing. What changed:  when to take this reasons to take this   isosorbide dinitrate 10 MG tablet Commonly known as: ISORDIL Take 10 mg by mouth at bedtime.   lipase/protease/amylase 12000-38000 units Cpep capsule Commonly known as: CREON Take 24,000 Units by mouth 3 (three) times daily before meals.   loratadine 10 MG tablet Commonly known as: CLARITIN Take 10 mg by mouth daily.   Multi-Vitamin Daily Tabs Take 1 tablet by mouth daily.   omeprazole 20 MG capsule Commonly known as: PRILOSEC Take 20 mg by mouth every other day. What changed: Another medication with the same name was removed. Continue taking this medication, and follow the directions you see here.   Spacer/Aero-Holding [employer] Use as directed with combivent. Ok to dispense any  spacer that works with the inhaler being rxed to pt.   Trulicity 1.5 PQ/3.3AQ Sopn Generic drug: Dulaglutide Inject 1.5 mg into the skin once a week. Fridays   vitamin C 500 MG tablet Commonly known as: ASCORBIC ACID Take 500 mg by mouth daily.        Follow-up [place]. Schedule an appointment as soon as possible for a visit.   Contact information: [place] [address] [phone]         Garvin Fila, MD. Schedule an appointment as soon as possible for a visit in 2 month(s).   Specialties: Neurology, Radiology Contact information: [address] [place] [address] [phone]                Subjective: No events overnight, no severe agitation. Pt without complaints this morning.  Discharge Exam: Filed Weights   05/21/21 1711  Weight: 78 kg  Elderly  in no distress Clear, nonlabored RRR Alert, incompletely oriented. Cooperative with improved R side weakness and facial droop. No new deficits.  Condition at discharge: stable  The results of significant diagnostics from this hospitalization (including imaging, microbiology, ancillary and laboratory) are listed below for reference.   Imaging Studies: DG Abd 1 View  Result Date: 05/21/2021 CLINICAL DATA:  An 87 year old male presents for MRI clearance. EXAM: ABDOMEN - 1 VIEW COMPARISON:  Abdominal radiograph from 2015. FINDINGS: EKG leads project over the chest and abdomen. Cholecystectomy clips are present in the RIGHT upper quadrant. Mild gaseous distension of the stomach. Moderate gaseous distension of colonic loops. Signs of rectal gas. No substantial small bowel dilation. No acute musculoskeletal findings to the extent evaluated. RIGHT and LEFT flank are excluded from view slightly on today's study which does encompass most of the abdomen and the pelvis. No metallic foreign bodies aside from cholecystectomy clips are noted. IMPRESSION: 1. No  metallic foreign bodies aside from cholecystectomy clips about the visualized abdomen. 2. Moderate gaseous distension of the stomach and colon. No acute findings. Electronically Signed   By: Zetta Bills M.D.   On: 05/21/2021 21:01   CT Head Wo Contrast  Result Date: 05/21/2021 CLINICAL DATA:  Six Neuro deficit, acute, stroke suspected. Right side facial droop, right arm and leg weakness EXAM: CT HEAD WITHOUT CONTRAST TECHNIQUE: Contiguous axial images were obtained from the base of the skull through the vertex without intravenous contrast. RADIATION DOSE REDUCTION: This exam was performed according to the departmental dose-optimization program which includes automated exposure control, adjustment of the mA and/or kV according to patient size and/or use of iterative reconstruction technique. COMPARISON:  None. FINDINGS: Brain: There is atrophy and chronic small vessel disease changes. Old bilateral cerebellar infarcts. No acute intracranial abnormality. Specifically, no hemorrhage, hydrocephalus, mass lesion, acute infarction, or significant intracranial injury. Vascular: No hyperdense vessel or unexpected calcification. Skull: No acute calvarial abnormality. Sinuses/Orbits: Mucosal thickening throughout the paranasal sinuses. Small air-fluid levels in the maxillary sinuses bilaterally. Other: None IMPRESSION: Old bilateral cerebellar infarcts. Atrophy, chronic microvascular disease. No acute intracranial abnormality. Electronically Signed   By: Rolm Baptise M.D.   On: 05/21/2021 19:15   MR ANGIO HEAD WO CONTRAST  Result Date: 05/22/2021 CLINICAL DATA:  Neuro deficit, acute, stroke suspected EXAM: MRI HEAD WITHOUT CONTRAST MRA HEAD WITHOUT CONTRAST MRA NECK WITHOUT CONTRAST TECHNIQUE: Multiplanar, multiecho pulse sequences of the brain and surrounding structures were obtained without intravenous contrast. Angiographic images of the Circle of Willis were obtained using MRA technique without intravenous  contrast. Angiographic images of the neck were obtained using MRA technique without intravenous contrast. Carotid stenosis measurements (when applicable) are obtained utilizing NASCET criteria, using the distal internal carotid diameter as the denominator. COMPARISON:  CT head February, 2023. FINDINGS: MRI HEAD FINDINGS Moderately motion limited study.  Within this limitation: Brain: Acute infarcts in the left perirolandic frontoparietal cortex. Additional smaller acute infarct in the more inferior left parietal lobe and right periventricular parietal lobe, both in white matter. No significant mass effect. Extensive confluent and patchy T2/FLAIR hyperintense in the white matter, nonspecific but compatible chronic microvascular disease. Moderate atrophy with ex vacuo ventricular dilation. No evidence of acute hemorrhage, mass lesion, midline shift, hydrocephalus or extra-axial fluid collection. Vascular: See below. Skull and upper cervical spine: Normal marrow signal. Sinuses/Orbits: Near complete opacification of the ethmoid air cells with moderate mucosal thickening of the frontal sinuses, maxillary sinuses and sphenoid sinuses and air-fluid levels. Other: No mastoid effusions. MRA HEAD  FINDINGS Anterior circulation: Moderate narrowing of the left ICA at the skull base. Intracranial ICAs are patent with mild right paraclinoid ICA stenosis. Bilateral MCAs and ACAs are patent without proximal hemodynamically significant stenosis. No definite aneurysm identified. Posterior circulation: Right dominant intradural vertebral artery. Mild-to-moderate narrowing of the left intradural vertebral artery. Basilar artery and bilateral posterior cerebral arteries are patent without proximal hemodynamically significant stenosis. No aneurysm identified. MRA NECK FINDINGS Motion limited. Great vessel origins are not imaged and there is limited visualization of the proximal vessels. The visualized common carotid arteries and proximal  internal carotid arteries are patent without evidence of severe significant stenosis. Moderate stenosis of the left ICA at the skull base, better characterized on MRA head. Bilateral vertebral arteries are patent without evidence of severe stenosis in the neck. IMPRESSION: MRI head: 1. Moderately motion study with acute infarcts in the left perirolandic frontoparietal cortex and smaller acute infarcts in bilateral parietal white matter. 2. Severe chronic microvascular ischemic disease and moderate cerebral atrophy (ICD10-G31.9). 3. Probable sinusitis, detailed above. MRA: 1. No large vessel occlusion. 2. Moderate stenosis of the left internal carotid artery at the skull base. 3. Mild to moderate left intradural vertebral artery stenosis. 4. Limited evaluation in the neck without visible severe stenosis. CTA could provide more sensitive evaluation if clinically indicated. Electronically Signed   By: Margaretha Sheffield M.D.   On: 05/22/2021 13:10   MR ANGIO NECK WO CONTRAST  Result Date: 05/22/2021 CLINICAL DATA:  Neuro deficit, acute, stroke suspected EXAM: MRI HEAD WITHOUT CONTRAST MRA HEAD WITHOUT CONTRAST MRA NECK WITHOUT CONTRAST TECHNIQUE: Multiplanar, multiecho pulse sequences of the brain and surrounding structures were obtained without intravenous contrast. Angiographic images of the Circle of Willis were obtained using MRA technique without intravenous contrast. Angiographic images of the neck were obtained using MRA technique without intravenous contrast. Carotid stenosis measurements (when applicable) are obtained utilizing NASCET criteria, using the distal internal carotid diameter as the denominator. COMPARISON:  CT head February, 2023. FINDINGS: MRI HEAD FINDINGS Moderately motion limited study.  Within this limitation: Brain: Acute infarcts in the left perirolandic frontoparietal cortex. Additional smaller acute infarct in the more inferior left parietal lobe and right periventricular parietal lobe,  both in white matter. No significant mass effect. Extensive confluent and patchy T2/FLAIR hyperintense in the white matter, nonspecific but compatible chronic microvascular disease. Moderate atrophy with ex vacuo ventricular dilation. No evidence of acute hemorrhage, mass lesion, midline shift, hydrocephalus or extra-axial fluid collection. Vascular: See below. Skull and upper cervical spine: Normal marrow signal. Sinuses/Orbits: Near complete opacification of the ethmoid air cells with moderate mucosal thickening of the frontal sinuses, maxillary sinuses and sphenoid sinuses and air-fluid levels. Other: No mastoid effusions. MRA HEAD FINDINGS Anterior circulation: Moderate narrowing of the left ICA at the skull base. Intracranial ICAs are patent with mild right paraclinoid ICA stenosis. Bilateral MCAs and ACAs are patent without proximal hemodynamically significant stenosis. No definite aneurysm identified. Posterior circulation: Right dominant intradural vertebral artery. Mild-to-moderate narrowing of the left intradural vertebral artery. Basilar artery and bilateral posterior cerebral arteries are patent without proximal hemodynamically significant stenosis. No aneurysm identified. MRA NECK FINDINGS Motion limited. Great vessel origins are not imaged and there is limited visualization of the proximal vessels. The visualized common carotid arteries and proximal internal carotid arteries are patent without evidence of severe significant stenosis. Moderate stenosis of the left ICA at the skull base, better characterized on MRA head. Bilateral vertebral arteries are patent without evidence of severe stenosis in the  neck. IMPRESSION: MRI head: 1. Moderately motion study with acute infarcts in the left perirolandic frontoparietal cortex and smaller acute infarcts in bilateral parietal white matter. 2. Severe chronic microvascular ischemic disease and moderate cerebral atrophy (ICD10-G31.9). 3. Probable sinusitis,  detailed above. MRA: 1. No large vessel occlusion. 2. Moderate stenosis of the left internal carotid artery at the skull base. 3. Mild to moderate left intradural vertebral artery stenosis. 4. Limited evaluation in the neck without visible severe stenosis. CTA could provide more sensitive evaluation if clinically indicated. Electronically Signed   By: Margaretha Sheffield M.D.   On: 05/22/2021 13:10   MR BRAIN WO CONTRAST  Result Date: 05/22/2021 CLINICAL DATA:  Neuro deficit, acute, stroke suspected EXAM: MRI HEAD WITHOUT CONTRAST MRA HEAD WITHOUT CONTRAST MRA NECK WITHOUT CONTRAST TECHNIQUE: Multiplanar, multiecho pulse sequences of the brain and surrounding structures were obtained without intravenous contrast. Angiographic images of the Circle of Willis were obtained using MRA technique without intravenous contrast. Angiographic images of the neck were obtained using MRA technique without intravenous contrast. Carotid stenosis measurements (when applicable) are obtained utilizing NASCET criteria, using the distal internal carotid diameter as the denominator. COMPARISON:  CT head February, 2023. FINDINGS: MRI HEAD FINDINGS Moderately motion limited study.  Within this limitation: Brain: Acute infarcts in the left perirolandic frontoparietal cortex. Additional smaller acute infarct in the more inferior left parietal lobe and right periventricular parietal lobe, both in white matter. No significant mass effect. Extensive confluent and patchy T2/FLAIR hyperintense in the white matter, nonspecific but compatible chronic microvascular disease. Moderate atrophy with ex vacuo ventricular dilation. No evidence of acute hemorrhage, mass lesion, midline shift, hydrocephalus or extra-axial fluid collection. Vascular: See below. Skull and upper cervical spine: Normal marrow signal. Sinuses/Orbits: Near complete opacification of the ethmoid air cells with moderate mucosal thickening of the frontal sinuses, maxillary sinuses  and sphenoid sinuses and air-fluid levels. Other: No mastoid effusions. MRA HEAD FINDINGS Anterior circulation: Moderate narrowing of the left ICA at the skull base. Intracranial ICAs are patent with mild right paraclinoid ICA stenosis. Bilateral MCAs and ACAs are patent without proximal hemodynamically significant stenosis. No definite aneurysm identified. Posterior circulation: Right dominant intradural vertebral artery. Mild-to-moderate narrowing of the left intradural vertebral artery. Basilar artery and bilateral posterior cerebral arteries are patent without proximal hemodynamically significant stenosis. No aneurysm identified. MRA NECK FINDINGS Motion limited. Great vessel origins are not imaged and there is limited visualization of the proximal vessels. The visualized common carotid arteries and proximal internal carotid arteries are patent without evidence of severe significant stenosis. Moderate stenosis of the left ICA at the skull base, better characterized on MRA head. Bilateral vertebral arteries are patent without evidence of severe stenosis in the neck. IMPRESSION: MRI head: 1. Moderately motion study with acute infarcts in the left perirolandic frontoparietal cortex and smaller acute infarcts in bilateral parietal white matter. 2. Severe chronic microvascular ischemic disease and moderate cerebral atrophy (ICD10-G31.9). 3. Probable sinusitis, detailed above. MRA: 1. No large vessel occlusion. 2. Moderate stenosis of the left internal carotid artery at the skull base. 3. Mild to moderate left intradural vertebral artery stenosis. 4. Limited evaluation in the neck without visible severe stenosis. CTA could provide more sensitive evaluation if clinically indicated. Electronically Signed   By: Margaretha Sheffield M.D.   On: 05/22/2021 13:10   US RENAL  Result Date: 05/24/2021 CLINICAL DATA:  AKI. EXAM: RENAL / URINARY TRACT ULTRASOUND COMPLETE COMPARISON:  01/03/2013. FINDINGS: Right Kidney: Renal  measurements: 11.5 x 4.8 x 3.9  cm = volume: 113.34 mL. Echogenicity within normal limits. No mass or hydronephrosis visualized. Left Kidney: Renal measurements: 9.8 x 5.8 x 4.6 cm = volume: 134.38 mL. Echogenicity within normal limits. No mass or hydronephrosis visualized. Bladder: Debris is present in the posterior bladder. Bilateral ureteral jets are noted. Other: None. IMPRESSION: 1. Normal evaluation of the kidneys. 2. Debris in the urinary bladder, possible infectious or inflammatory cystitis. Electronically Signed   By: Brett Fairy M.D.   On: 05/24/2021 00:56   DG Chest Port 1 View  Result Date: 05/21/2021 CLINICAL DATA:  Weakness.  Facial droop. EXAM: PORTABLE CHEST 1 VIEW COMPARISON:  08/31/2018. FINDINGS: Cardiac silhouette normal in size.  No mediastinal or hilar masses. Linear opacities are noted at the left lung base consistent with atelectasis or scarring, stable. Remainder of the lungs is clear. No convincing pleural effusion. No pneumothorax. Skeletal structures are grossly intact. IMPRESSION: No active disease. Electronically Signed   By: Lajean Manes M.D.   On: 05/21/2021 19:04   VAS US CAROTID  Result Date: 05/23/2021 Carotid Arterial Duplex Study Patient Name:  Justin [name]  Date of Exam:   05/23/2021 Medical Rec #: [id number]         Accession #:    [id number] Date of Birth: 12/07/1934         Patient Gender: M Patient Age:   77 years Exam Location:  [facility] Procedure:      VAS US CAROTID Referring Phys: Hosie Poisson --------------------------------------------------------------------------------  Indications:       Weakness. Risk Factors:      Hypertension, hyperlipidemia, Diabetes. Comparison Study:  No prior study Performing Technologist: Maudry Mayhew MHA, RDMS, RVT, RDCS  Examination Guidelines: A complete evaluation includes B-mode imaging, spectral Doppler, color Doppler, and power Doppler as needed of all accessible portions of each vessel. Bilateral testing  is considered an integral part of a complete examination. Limited examinations for reoccurring indications may be performed as noted.  Right Carotid Findings: +---------+--------+-------+--------+---------------------------------+--------+            PSV cm/s EDV     Stenosis Plaque Description                Comments                      cm/s                                                         +---------+--------+-------+--------+---------------------------------+--------+  CCA Prox  73       10               smooth and heterogenous                     +---------+--------+-------+--------+---------------------------------+--------+  CCA       58       8                                                             Distal                                                                          +---------+--------+-------+--------+---------------------------------+--------+  ICA Prox  58       12               smooth, heterogenous and calcific           +---------+--------+-------+--------+---------------------------------+--------+  ICA       53       17                                                            Distal                                                                          +---------+--------+-------+--------+---------------------------------+--------+  ECA       106      9                heterogenous, irregular and                                                      calcific                                    +---------+--------+-------+--------+---------------------------------+--------+ +----------+--------+-------+----------------+-------------------+             PSV cm/s EDV cms Describe         Arm Pressure (mmHG)  +----------+--------+-------+----------------+-------------------+  Subclavian 75               Multiphasic, WNL                      +----------+--------+-------+----------------+-------------------+ +---------+--------+--+--------+-+---------+  Vertebral PSV cm/s 43 EDV  cm/s 9 Antegrade  +---------+--------+--+--------+-+---------+  Left Carotid Findings: +---------+--------+-------+--------+---------------------------------+--------+            PSV cm/s EDV     Stenosis Plaque Description                Comments                      cm/s                                                         +---------+--------+-------+--------+---------------------------------+--------+  CCA Prox  90       13                                                           +---------+--------+-------+--------+---------------------------------+--------+  CCA       80  13               heterogenous, irregular and                  Distal                              calcific                                    +---------+--------+-------+--------+---------------------------------+--------+  ICA Prox  48       12               heterogenous, irregular and                                                      calcific                                    +---------+--------+-------+--------+---------------------------------+--------+  ICA       54       15                                                            Distal                                                                          +---------+--------+-------+--------+---------------------------------+--------+  ECA       76       7                smooth and calcific                         +---------+--------+-------+--------+---------------------------------+--------+ +----------+--------+--------+----------------+-------------------+             PSV cm/s EDV cm/s Describe         Arm Pressure (mmHG)  +----------+--------+--------+----------------+-------------------+  Subclavian 130               Multiphasic, WNL                      +----------+--------+--------+----------------+-------------------+ +---------+--------+--+--------+-+---------+  Vertebral PSV cm/s 42 EDV cm/s 9 Antegrade  +---------+--------+--+--------+-+---------+   Summary:  Right Carotid: Velocities in the right ICA are consistent with a 1-39% stenosis. Left Carotid: Velocities in the left ICA are consistent with a 1-39% stenosis. Vertebrals:  Bilateral vertebral arteries demonstrate antegrade flow. Subclavians: Normal flow hemodynamics were seen in bilateral subclavian              arteries. *See table(s) above for measurements and observations.  Electronically signed by Deitra Mayo MD on 05/23/2021 at 4:13:21 PM.    Final  ECHOCARDIOGRAM LIMITED  Result Date: 05/22/2021    ECHOCARDIOGRAM LIMITED REPORT   Patient Name:   Justin [name] Date of Exam: 05/22/2021 Medical Rec #:  [id number]        Height:       72.0 in Accession #:    [id number]       Weight:       172.0 lb Date of Birth:  1934/05/16        BSA:          1.998 m Patient Age:    28 years         BP:           127/61 mmHg Patient Gender: M                HR:           82 bpm. Exam Location:  Inpatient Procedure: Limited Echo, Color Doppler and Cardiac Doppler Indications:     Stroke i63.9  History:         Patient has no prior history of Echocardiogram examinations.                  CAD; Risk Factors:Hypertension, Diabetes and Dyslipidemia.  Sonographer:     Raquel Sarna Senior RDCS Referring Phys:  [id number] Jane Diagnosing Phys: Oswaldo Milian MD  Sonographer Comments: COVID+ at time of study IMPRESSIONS  1. Left ventricular ejection fraction, by estimation, is 60 to 65%. The left ventricle has normal function. The left ventricle has no regional wall motion abnormalities.  2. Right ventricular systolic function is normal. The right ventricular size is normal. There is normal pulmonary artery systolic pressure. The estimated right ventricular systolic pressure is 51.0 mmHg.  3. The mitral valve is abnormal. Moderate leaflet thickening. No evidence of mitral valve regurgitation.  4. Tricuspid valve regurgitation is mild to moderate.  5. The aortic valve is tricuspid. Aortic valve regurgitation is not  visualized. AV gradients were not measured  6. The inferior vena cava is normal in size with greater than 50% respiratory variability, suggesting right atrial pressure of 3 mmHg. FINDINGS  Left Ventricle: Left ventricular ejection fraction, by estimation, is 60 to 65%. The left ventricle has normal function. The left ventricle has no regional wall motion abnormalities. Right Ventricle: The right ventricular size is normal. Right ventricular systolic function is normal. There is normal pulmonary artery systolic pressure. The tricuspid regurgitant velocity is 2.75 m/s, and with an assumed right atrial pressure of 3 mmHg,  the estimated right ventricular systolic pressure is 25.8 mmHg. Mitral Valve: The mitral valve is abnormal. There is moderate thickening of the mitral valve leaflet(s). Tricuspid Valve: The tricuspid valve is normal in structure. Tricuspid valve regurgitation is mild to moderate. Aortic Valve: The aortic valve is tricuspid. Aortic valve regurgitation is not visualized. No aortic stenosis is present. Pulmonic Valve: The pulmonic valve was not well visualized. Pulmonic valve regurgitation is not visualized. Venous: The inferior vena cava is normal in size with greater than 50% respiratory variability, suggesting right atrial pressure of 3 mmHg. RIGHT VENTRICLE RV S prime:     13.20 cm/s TAPSE (M-mode): 2.1 cm TRICUSPID VALVE TR Peak grad:   30.2 mmHg TR Vmax:        275.00 cm/s Oswaldo Milian MD Electronically signed by Oswaldo Milian MD Signature Date/Time: 05/22/2021/2:49:53 PM    Final (Updated)     Microbiology: Results for orders placed or performed during the hospital encounter of 05/21/21  Urine  Culture     Status: Abnormal   Collection Time: 05/21/21  8:40 AM   Specimen: Urine, Clean Catch  Result Value Ref Range Status   Specimen Description URINE, CLEAN CATCH  Final   Special Requests   Final    NONE Performed at [facility], [address], [place], [place]  [address]    Culture (A)  Final    >=100,000 COLONIES/mL MULTIPLE SPECIES PRESENT, SUGGEST RECOLLECTION   Report Status 05/23/2021 FINAL  Final  Resp Panel by RT-PCR (Flu A&B, Covid) Nasopharyngeal Swab     Status: Abnormal   Collection Time: 05/21/21  8:53 PM   Specimen: Nasopharyngeal Swab; Nasopharyngeal(NP) swabs in vial transport medium  Result Value Ref Range Status   SARS Coronavirus 2 by RT PCR POSITIVE (A) NEGATIVE Final    Comment: (NOTE) SARS-CoV-2 target nucleic acids are DETECTED.  The SARS-CoV-2 RNA is generally detectable in upper respiratory specimens during the acute phase of infection. Positive results are indicative of the presence of the identified virus, but do not rule out bacterial infection or co-infection with other pathogens not detected by the test. Clinical correlation with patient history and other diagnostic information is necessary to determine patient infection status. The expected result is Negative.  Fact Sheet for Patients: EntrepreneurPulse.com.au  Fact Sheet for Healthcare Providers: IncredibleEmployment.be  This test is not yet approved or cleared by the [place] FDA and  has been authorized for detection and/or diagnosis of SARS-CoV-2 by FDA under an Emergency Use Authorization (EUA).  This EUA will remain in effect (meaning this test can be used) for the duration of  the COVID-19 declaration under Section 564(b)(1) of the A ct, 21 U.S.C. section 360bbb-3(b)(1), unless the authorization is terminated or revoked sooner.     Influenza A by PCR NEGATIVE NEGATIVE Final   Influenza B by PCR NEGATIVE NEGATIVE Final    Comment: (NOTE) The Xpert Xpress SARS-CoV-2/FLU/RSV plus assay is intended as an aid in the diagnosis of influenza from Nasopharyngeal swab specimens and should not be used as a sole basis for treatment. Nasal washings and aspirates are unacceptable for Xpert Xpress  SARS-CoV-2/FLU/RSV testing.  Fact Sheet for Patients: EntrepreneurPulse.com.au  Fact Sheet for Healthcare Providers: IncredibleEmployment.be  This test is not yet approved or cleared by the [place] FDA and has been authorized for detection and/or diagnosis of SARS-CoV-2 by FDA under an Emergency Use Authorization (EUA). This EUA will remain in effect (meaning this test can be used) for the duration of the COVID-19 declaration under Section 564(b)(1) of the Act, 21 U.S.C. section 360bbb-3(b)(1), unless the authorization is terminated or revoked.  Performed at [facility], [place] [address] [place], [place] [address]   Urine Culture     Status: Abnormal   Collection Time: 05/23/21 11:53 AM   Specimen: Urine, Clean Catch  Result Value Ref Range Status   Specimen Description URINE, CLEAN CATCH  Final   Special Requests   Final    NONE Performed at [facility], [place] [address], [place], [place] [address]    Culture (A)  Final    >=100,000 COLONIES/mL MULTIPLE SPECIES PRESENT, SUGGEST RECOLLECTION   Report Status 05/24/2021 FINAL  Final    Labs: CBC: Recent Labs  Lab 05/21/21 1817 05/22/21 0353 05/22/21 2031 05/24/21 0423  WBC 13.2* 10.8* 11.1* 10.2  NEUTROABS 6.3 6.4 6.9 5.4  HGB 10.1* 10.1* 9.4* 9.4*  HCT 31.5* 32.3* 28.3* 28.3*  MCV 89.5 92.6 89.0 89.6  PLT 358 347 332 370   Basic Metabolic Panel: Recent Labs  Lab 05/21/21 1817 05/22/21 0353 05/22/21 2031 05/23/21 0538 05/24/21 0829  NA 133* 134* 132* 134* 136  K 4.1 4.5 4.0 4.0 4.2  CL 104 106 103 103 103  CO2 22 20* 20* 21* 25  GLUCOSE 108* 118* 213* 80 98  BUN 28* 28* 24* 23 20  CREATININE 1.83* 1.82* 1.69* 1.77* 1.62*  CALCIUM 8.7* 8.6* 8.4* 9.1 8.8*  MG  --  1.7  --   --  1.9   Liver Function Tests: Recent Labs  Lab 05/22/21 0353 05/22/21 2031  AST 19 18  ALT 10 10  ALKPHOS 73 69  BILITOT 0.4 0.5  PROT 8.7* 7.9  ALBUMIN 2.5* 2.4*    CBG: Recent Labs  Lab 05/24/21 1128 05/24/21 1745 05/24/21 2131 05/25/21 0622 05/25/21 0637  GLUCAP 131* 311* 198* 174* 163*    Discharge time spent: greater than 30 minutes.  Signed: Patrecia Pour, MD [facility] 05/25/2021

## 2021-05-25 NOTE — Progress Notes (Addendum)
Attempted to call [place] for Nurse to nurse report x2, no pick up [phone]

## 2021-05-25 NOTE — Progress Notes (Signed)
Manufacturing engineer [facility]) Hospital Liaison Note  Notified by [facility] manager of patient/family request for [facility] palliative services at [facility] after discharge.   [facility] hospital liaison will follow patient for discharge disposition.   Please call with any hospice or outpatient palliative care related questions.   Thank you for the opportunity to participate in this patient's care.   [facility] Liaison [phone]

## 2021-06-07 ENCOUNTER — Non-Acute Institutional Stay: Payer: Medicare Other | Admitting: Internal Medicine

## 2021-06-07 ENCOUNTER — Non-Acute Institutional Stay: Payer: Self-pay | Admitting: Internal Medicine

## 2021-06-07 ENCOUNTER — Other Ambulatory Visit: Payer: Self-pay

## 2021-07-27 ENCOUNTER — Ambulatory Visit (INDEPENDENT_AMBULATORY_CARE_PROVIDER_SITE_OTHER): Payer: Medicare Other | Admitting: Neurology

## 2021-07-27 ENCOUNTER — Encounter: Payer: Self-pay | Admitting: Neurology

## 2021-07-27 VITALS — BP 122/62 | HR 88

## 2021-07-27 DIAGNOSIS — G8191 Hemiplegia, unspecified affecting right dominant side: Secondary | ICD-10-CM

## 2021-07-27 DIAGNOSIS — R29898 Other symptoms and signs involving the musculoskeletal system: Secondary | ICD-10-CM | POA: Diagnosis not present

## 2021-07-27 DIAGNOSIS — I6381 Other cerebral infarction due to occlusion or stenosis of small artery: Secondary | ICD-10-CM | POA: Diagnosis not present

## 2021-07-27 NOTE — Progress Notes (Signed)
?[facility] ?[address] ?[place] [address] ?[phone] ? ?     OFFICE FOLLOW-UP NOTE ? ?Mr. [name] ?Date of Birth:  April 10, 1934 ?Medical Record Number:  [id number]  ? ?HPI: [name] is a 87 year old African-American male seen today for initial office visit following hospital admission for stroke in February 2023.  History is obtained from the patient, review of electronic medical records and opossum reviewed pertinent available imaging films in PACS.  He has past medical history of diabetes, hypertension, hyperlipidemia, arthritis, iron deficiency anemia, acute pancreatitis in 2014, prior history of stroke and TIA who presented to the emergency room via EMS from [facility] and rehab nursing home where he was staying on 05/21/2021 with sudden onset of right facial droop, right upper extremity plegia and right lower extremity weakness.  He was last known well the night prior and was outside time window for thrombolysis.  CT scan of the head showed old bilateral cerebellar infarcts and changes of small vessel disease and atrophy.  CT angiogram of the head and neck was attempted but patient was quite restless and hence could not be completed.  Carotid ultrasound showed bilateral 1-39% stenosis.  2D echo showed ejection fraction 60 to 65% without cardiac source of embolism.  LDL cholesterol is 85 mg percent.  Hemoglobin A1c was 7.6.  MRI of the brain showed no intracranial large vessel stenosis or occlusion.  Patient was started on aspirin Plavix for 3 weeks followed by aspirin alone.  He was sent back to [facility] and rehab center for ongoing therapy needs.  Patient states he is recovered his speech and facial weakness but right hand weakness persists.  His leg weakness is also better.  Is able to ambulate but has not brought his cane or walker today.  He is tolerating aspirin well without bruising or bleeding.  Blood pressure in good control.  He states his sugars are  also doing fine.  He has had no recurrent stroke or TIA symptoms. ? ?ROS:   ?14 system review of systems is positive for hand weakness, difficulty walking, slurred speech poor balance fall risk all other systems negative ? ?PMH:  ?Past Medical History:  ?Diagnosis Date  ? Arthritis   ? Diabetes mellitus   ? HTN (hypertension)   ? Hypercholesteremia   ? Iron deficiency   ? Pancreatitis, acute 06/02/2012  ? Diagnosed 05/2012   ? Personal history of colonic adenomas 04/15/2013  ? 04/15/2013 2 diminutive polyps    ? Stroke [facility])   ? TIA (transient ischemic attack)   ? ? ?Social History:  ?Social History  ? ?Socioeconomic History  ? Marital status: Married  ?  Spouse name: Not on file  ? Number of children: 6  ? Years of education: Not on file  ? Highest education level: Not on file  ?Occupational History  ? Not on file  ?Tobacco Use  ? Smoking status: Former  ?  Years: 35.00  ?  Types: Cigarettes  ?  Quit date: 04/09/1990  ?  Years since quitting: 31.3  ? Smokeless tobacco: Never  ?Substance and Sexual Activity  ? Alcohol use: No  ?  Comment: "per pt stopped drinking in the 90's"  ? Drug use: No  ? Sexual activity: Not on file  ?Other Topics Concern  ? Not on file  ?Social History Narrative  ? Not on file  ? ?Social Determinants of Health  ? ?Financial Resource Strain: Not on file  ?Food Insecurity:  Not on file  ?Transportation Needs: Not on file  ?Physical Activity: Not on file  ?Stress: Not on file  ?Social Connections: Not on file  ?Intimate Partner Violence: Not on file  ? ? ?Medications:   ?Current Outpatient Medications on File Prior to Visit  ?Medication Sig Dispense Refill  ? acetaminophen (TYLENOL) 325 MG tablet Take 650 mg by mouth daily.    ? Alcohol Swabs PADS Use to check blood sugar 3 times daily. Dx: E10.9 300 each 3  ? aspirin 81 MG chewable tablet Chew 81 mg by mouth daily.    ? azelastine (ASTELIN) 0.1 % nasal spray Place 2 sprays into both nostrils 2 (two) times daily. Use in each nostril as directed    ?  Blood Glucose Calibration (ACCU-CHEK AVIVA) SOLN Use to check blood sugar 3 times daily. Dx: E10.9 1 each 3  ? Blood Glucose Monitoring Suppl (ACCU-CHEK AVIVA PLUS) w/Device KIT Use to check blood sugar 3 times daily. Dx: E10.9 1 kit 0  ? cholecalciferol (VITAMIN D3) 25 MCG (1000 UNIT) tablet Take 1,000 Units by mouth daily.    ? ferrous sulfate 325 (65 FE) MG tablet Take 325 mg by mouth daily with breakfast.    ? fluticasone (FLONASE) 50 MCG/ACT nasal spray Place into both nostrils daily.    ? fluticasone furoate-vilanterol (BREO ELLIPTA) 100-25 MCG/ACT AEPB Inhale 1 puff into the lungs daily.    ? gabapentin (NEURONTIN) 100 MG capsule Take 100 mg by mouth daily. **2 capsules once daily**    ? gabapentin (NEURONTIN) 400 MG capsule Take 400 mg by mouth at bedtime.    ? glucagon (GLUCAGON EMERGENCY) 1 MG injection Inject 1 mg into the muscle once as needed. 1 each 12  ? glucose blood (ACCU-CHEK AVIVA PLUS) test strip Use to check blood sugar 3 times daily. Dx: E10.9 300 each 3  ? guaifenesin (HUMIBID E) 400 MG TABS tablet Take 400 mg by mouth every 6 (six) hours as needed (congestion).    ? guaiFENesin (ROBITUSSIN) 100 MG/5ML liquid Take 5 mLs by mouth every 6 (six) hours as needed for cough or to loosen phlegm.    ? HUMALOG KWIKPEN 100 UNIT/ML KwikPen Inject 12 Units into the skin 4 (four) times daily -  before meals and at bedtime. If BS is less than 70 call NP/PA  ?BS 70-100 0 units  ?BS 201-250 2 units  ?BS 251-300 4 units  ?BS 301-350 6 UNITS  ?BS 351-400 8 UNITS  ?BS 401-450 10 UNITS  ?BS 451-600 12 UNITS    ? insulin glargine (LANTUS) 100 UNIT/ML injection Inject 38 Units into the skin daily.    ? Insulin Pen Needle 32G X 4 MM MISC Use as directed to inject insulin tidwc 100 each 11  ? Ipratropium-Albuterol (COMBIVENT) 20-100 MCG/ACT AERS respimat Inhale 1 puff into the lungs every 6 (six) hours as needed for wheezing.    ? isosorbide dinitrate (ISORDIL) 10 MG tablet Take 10 mg by mouth at bedtime.    ?  Lancets (ACCU-CHEK SOFT TOUCH) lancets Use to check blood sugar 3 times daily. Dx: E10.9 300 each 3  ? lidocaine (LMX) 4 % cream Apply 1 application. topically as needed.    ? lipase/protease/amylase (CREON) 12000-38000 units CPEP capsule Take 24,000 Units by mouth 3 (three) times daily before meals.    ? loratadine (CLARITIN) 10 MG tablet Take 10 mg by mouth daily.    ? Multiple Vitamin (MULTI-VITAMIN DAILY) TABS Take 1 tablet by mouth daily.    ?  omeprazole (PRILOSEC) 20 MG capsule Take 20 mg by mouth every other day.    ? polyethylene glycol (MIRALAX / GLYCOLAX) 17 g packet Take 17 g by mouth daily.    ? Spacer/Aero-Holding Dorise Bullion Use as directed with combivent. Ok to dispense any spacer that works with the inhaler being rxed to pt. 1 each 1  ? TRULICITY 1.5 QD/8.2ME SOPN Inject 1.5 mg into the skin once a week. Fridays    ? vitamin C (ASCORBIC ACID) 500 MG tablet Take 500 mg by mouth daily.    ? atorvastatin (LIPITOR) 80 MG tablet Take 1 tablet (80 mg total) by mouth daily. 30 tablet 0  ? [DISCONTINUED] testosterone cypionate (DEPOTESTOTERONE CYPIONATE) 200 MG/ML injection Inject into the muscle every 14 (fourteen) days.    ? ?Current Facility-Administered Medications on File Prior to Visit  ?Medication Dose Route Frequency Provider Last Rate Last Admin  ? testosterone cypionate (DEPOTESTOTERONE CYPIONATE) injection 200 mg  200 mg Intramuscular Q14 Days Georgiann Mccoy M, PA-C   200 mg at 04/05/13 1583  ? ? ?Allergies:  No Known Allergies ? ?Physical Exam ?General: Frail malnourished looking elderly African-American male, seated, in no evident distress ?Head: head normocephalic and atraumatic.  ?Neck: supple with no carotid or supraclavicular bruits ?Cardiovascular: regular rate and rhythm, no murmurs ?Musculoskeletal: no deformity ?Skin:  no rash/petichiae ?Vascular:  Normal pulses all extremities ?Vitals:  ? 07/27/21 0815  ?BP: 122/62  ?Pulse: 88  ?SpO2: 98%  ? ?Neurologic Exam ?Mental Status: Awake and  fully alert. Oriented to place and person only.e. Recent and remote memory intact. Attention span, concentration and fund of knowledge poor. Mood and affect appropriate.  ?Cranial Nerves: Fundoscopic exam rev

## 2021-07-27 NOTE — Patient Instructions (Signed)
I had a long d/w patient about his recent strokes, right hemiparesis,, risk for recurrent stroke/TIAs, personally independently reviewed imaging studies and stroke evaluation results and answered questions.Continue aspirin 81 mg daily  for secondary stroke prevention and maintain strict control of hypertension with blood pressure goal below 130/90, diabetes with hemoglobin A1c goal below 6.5% and lipids with LDL cholesterol goal below 70 mg/dL. I also advised the patient to eat a healthy diet with plenty of whole grains, cereals, fruits and vegetables, exercise regularly and maintain ideal body weight . Continue physical and occupational therapies.Followup in the future with me only as needed. ?Stroke Prevention ?Some medical conditions and behaviors can lead to a higher chance of having a stroke. You can help prevent a stroke by eating healthy, exercising, not smoking, and managing any medical conditions you have. ?Stroke is a leading cause of functional impairment. Primary prevention is particularly important because a majority of strokes are first-time events. Stroke changes the lives of not only those who experience a stroke but also their family and other caregivers. ?How can this condition affect me? ?A stroke is a medical emergency and should be treated right away. A stroke can lead to brain damage and can sometimes be life-threatening. If a person gets medical treatment right away, there is a better chance of surviving and recovering from a stroke. ?What can increase my risk? ?The following medical conditions may increase your risk of a stroke: ?Cardiovascular disease. ?High blood pressure (hypertension). ?Diabetes. ?High cholesterol. ?Sickle cell disease. ?Blood clotting disorders (hypercoagulable state). ?Obesity. ?Sleep disorders (obstructive sleep apnea). ?Other risk factors include: ?Being older than age 5. ?Having a history of blood clots, stroke, or mini-stroke (transient ischemic attack,  TIA). ?Genetic factors, such as race, ethnicity, or a family history of stroke. ?Smoking cigarettes or using other tobacco products. ?Taking birth control pills, especially if you also use tobacco. ?Heavy use of alcohol or drugs, especially cocaine and methamphetamine. ?Physical inactivity. ?What actions can I take to prevent this? ?Manage your health conditions ?High cholesterol levels. ?Eating a healthy diet is important for preventing high cholesterol. If cholesterol cannot be managed through diet alone, you may need to take medicines. ?Take any prescribed medicines to control your cholesterol as told by your health care provider. ?Hypertension. ?To reduce your risk of stroke, try to keep your blood pressure below 130/80. ?Eating a healthy diet and exercising regularly are important for controlling blood pressure. If these steps are not enough to manage your blood pressure, you may need to take medicines. ?Take any prescribed medicines to control hypertension as told by your health care provider. ?Ask your health care provider if you should monitor your blood pressure at home. ?Have your blood pressure checked every year, even if your blood pressure is normal. Blood pressure increases with age and some medical conditions. ?Diabetes. ?Eating a healthy diet and exercising regularly are important parts of managing your blood sugar (glucose). If your blood sugar cannot be managed through diet and exercise, you may need to take medicines. ?Take any prescribed medicines to control your diabetes as told by your health care provider. ?Get evaluated for obstructive sleep apnea. Talk to your health care provider about getting a sleep evaluation if you snore a lot or have excessive sleepiness. ?Make sure that any other medical conditions you have, such as atrial fibrillation or atherosclerosis, are managed. ?Nutrition ?Follow instructions from your health care provider about what to eat or drink to help manage your health  condition. These instructions may  include: ?Reducing your daily calorie intake. ?Limiting how much salt (sodium) you use to 1,500 milligrams (mg) each day. ?Using only healthy fats for cooking, such as olive oil, canola oil, or sunflower oil. ?Eating healthy foods. You can do this by: ?Choosing foods that are high in fiber, such as whole grains, and fresh fruits and vegetables. ?Eating at least 5 servings of fruits and vegetables a day. Try to fill one-half of your plate with fruits and vegetables at each meal. ?Choosing lean protein foods, such as lean cuts of meat, poultry without skin, fish, tofu, beans, and nuts. ?Eating low-fat dairy products. ?Avoiding foods that are high in sodium. This can help lower blood pressure. ?Avoiding foods that have saturated fat, trans fat, and cholesterol. This can help prevent high cholesterol. ?Avoiding processed and prepared foods. ?Counting your daily carbohydrate intake. ? ?Lifestyle ?If you drink alcohol: ?Limit how much you have to: ?0-1 drink a day for women who are not pregnant. ?0-2 drinks a day for men. ?Know how much alcohol is in your drink. In the [place] one drink equals one 12 oz bottle of beer (349m), one 5 oz glass of wine (1426m, or one 1? oz glass of hard liquor (4434m ?Do not use any products that contain nicotine or tobacco. These products include cigarettes, chewing tobacco, and vaping devices, such as e-cigarettes. If you need help quitting, ask your health care provider. ?Avoid secondhand smoke. ?Do not use drugs. ?Activity ? ?Try to stay at a healthy weight. ?Get at least 30 minutes of exercise on most days, such as: ?Fast walking. ?Biking. ?Swimming. ?Medicines ?Take over-the-counter and prescription medicines only as told by your health care provider. Aspirin or blood thinners (antiplatelets or anticoagulants) may be recommended to reduce your risk of forming blood clots that can lead to stroke. ?Avoid taking birth control pills. Talk to your health  care provider about the risks of taking birth control pills if: ?You are over 35 86ars old. ?You smoke. ?You get very bad headaches. ?You have had a blood clot. ?Where to find more information ?American Stroke Association: www.strokeassociation.org ?Get help right away if: ?You or a loved one has any symptoms of a stroke. "BE FAST" is an easy way to remember the main warning signs of a stroke: ?B - Balance. Signs are dizziness, sudden trouble walking, or loss of balance. ?E - Eyes. Signs are trouble seeing or a sudden change in vision. ?F - Face. Signs are sudden weakness or numbness of the face, or the face or eyelid drooping on one side. ?A - Arms. Signs are weakness or numbness in an arm. This happens suddenly and usually on one side of the body. ?S - Speech. Signs are sudden trouble speaking, slurred speech, or trouble understanding what people say. ?T - Time. Time to call emergency services. Write down what time symptoms started. ?You or a loved one has other signs of a stroke, such as: ?A sudden, severe headache with no known cause. ?Nausea or vomiting. ?Seizure. ?These symptoms may represent a serious problem that is an emergency. Do not wait to see if the symptoms will go away. Get medical help right away. Call your local emergency services (911 in the [place] Do not drive yourself to the hospital. ?Summary ?You can help to prevent a stroke by eating healthy, exercising, not smoking, limiting alcohol intake, and managing any medical conditions you may have. ?Do not use any products that contain nicotine or tobacco. These include cigarettes, chewing tobacco, and vaping devices,  such as e-cigarettes. If you need help quitting, ask your health care provider. ?Remember "BE FAST" for warning signs of a stroke. Get help right away if you or a loved one has any of these signs. ?This information is not intended to replace advice given to you by your health care provider. Make sure you discuss any questions you have  with your health care provider. ?Document Revised: 10/26/2019 Document Reviewed: 10/26/2019 ?Elsevier Patient Education ? [place]. ? ? ?

## 2021-11-15 ENCOUNTER — Observation Stay (HOSPITAL_COMMUNITY): Payer: Medicare Other

## 2021-11-15 ENCOUNTER — Emergency Department (HOSPITAL_COMMUNITY): Payer: Medicare Other

## 2021-11-15 ENCOUNTER — Other Ambulatory Visit: Payer: Self-pay

## 2021-11-15 ENCOUNTER — Encounter (HOSPITAL_COMMUNITY): Payer: Self-pay

## 2021-11-15 ENCOUNTER — Inpatient Hospital Stay (HOSPITAL_COMMUNITY)
Admission: EM | Admit: 2021-11-15 | Discharge: 2021-11-28 | DRG: 616 | Disposition: A | Discharge status: Skilled Nursing Facility | Payer: Medicare Other | Source: Skilled Nursing Facility | Attending: Family Medicine | Admitting: Family Medicine

## 2021-11-15 DIAGNOSIS — J440 Chronic obstructive pulmonary disease with acute lower respiratory infection: Secondary | ICD-10-CM | POA: Diagnosis present

## 2021-11-15 DIAGNOSIS — L84 Corns and callosities: Secondary | ICD-10-CM | POA: Diagnosis present

## 2021-11-15 DIAGNOSIS — K861 Other chronic pancreatitis: Secondary | ICD-10-CM

## 2021-11-15 DIAGNOSIS — Z8673 Personal history of transient ischemic attack (TIA), and cerebral infarction without residual deficits: Secondary | ICD-10-CM

## 2021-11-15 DIAGNOSIS — Z7985 Long-term (current) use of injectable non-insulin antidiabetic drugs: Secondary | ICD-10-CM

## 2021-11-15 DIAGNOSIS — Q6689 Other  specified congenital deformities of feet: Secondary | ICD-10-CM

## 2021-11-15 DIAGNOSIS — Z794 Long term (current) use of insulin: Secondary | ICD-10-CM

## 2021-11-15 DIAGNOSIS — I129 Hypertensive chronic kidney disease with stage 1 through stage 4 chronic kidney disease, or unspecified chronic kidney disease: Secondary | ICD-10-CM | POA: Diagnosis present

## 2021-11-15 DIAGNOSIS — E782 Mixed hyperlipidemia: Secondary | ICD-10-CM

## 2021-11-15 DIAGNOSIS — F0390 Unspecified dementia without behavioral disturbance: Secondary | ICD-10-CM | POA: Diagnosis present

## 2021-11-15 DIAGNOSIS — E1152 Type 2 diabetes mellitus with diabetic peripheral angiopathy with gangrene: Secondary | ICD-10-CM | POA: Diagnosis present

## 2021-11-15 DIAGNOSIS — Z8249 Family history of ischemic heart disease and other diseases of the circulatory system: Secondary | ICD-10-CM

## 2021-11-15 DIAGNOSIS — G459 Transient cerebral ischemic attack, unspecified: Secondary | ICD-10-CM | POA: Diagnosis present

## 2021-11-15 DIAGNOSIS — E118 Type 2 diabetes mellitus with unspecified complications: Secondary | ICD-10-CM | POA: Diagnosis present

## 2021-11-15 DIAGNOSIS — E1169 Type 2 diabetes mellitus with other specified complication: Secondary | ICD-10-CM | POA: Diagnosis not present

## 2021-11-15 DIAGNOSIS — D638 Anemia in other chronic diseases classified elsewhere: Secondary | ICD-10-CM | POA: Diagnosis not present

## 2021-11-15 DIAGNOSIS — L8990 Pressure ulcer of unspecified site, unspecified stage: Secondary | ICD-10-CM | POA: Insufficient documentation

## 2021-11-15 DIAGNOSIS — Z7951 Long term (current) use of inhaled steroids: Secondary | ICD-10-CM

## 2021-11-15 DIAGNOSIS — N1832 Chronic kidney disease, stage 3b: Secondary | ICD-10-CM | POA: Diagnosis not present

## 2021-11-15 DIAGNOSIS — E1165 Type 2 diabetes mellitus with hyperglycemia: Secondary | ICD-10-CM | POA: Diagnosis present

## 2021-11-15 DIAGNOSIS — E119 Type 2 diabetes mellitus without complications: Secondary | ICD-10-CM

## 2021-11-15 DIAGNOSIS — L089 Local infection of the skin and subcutaneous tissue, unspecified: Principal | ICD-10-CM | POA: Diagnosis present

## 2021-11-15 DIAGNOSIS — L602 Onychogryphosis: Secondary | ICD-10-CM | POA: Diagnosis present

## 2021-11-15 DIAGNOSIS — Z87891 Personal history of nicotine dependence: Secondary | ICD-10-CM

## 2021-11-15 DIAGNOSIS — J189 Pneumonia, unspecified organism: Secondary | ICD-10-CM | POA: Diagnosis present

## 2021-11-15 DIAGNOSIS — I70261 Atherosclerosis of native arteries of extremities with gangrene, right leg: Secondary | ICD-10-CM | POA: Diagnosis present

## 2021-11-15 DIAGNOSIS — E11628 Type 2 diabetes mellitus with other skin complications: Secondary | ICD-10-CM | POA: Diagnosis not present

## 2021-11-15 DIAGNOSIS — Z79890 Hormone replacement therapy (postmenopausal): Secondary | ICD-10-CM

## 2021-11-15 DIAGNOSIS — Z22322 Carrier or suspected carrier of Methicillin resistant Staphylococcus aureus: Secondary | ICD-10-CM

## 2021-11-15 DIAGNOSIS — K219 Gastro-esophageal reflux disease without esophagitis: Secondary | ICD-10-CM | POA: Diagnosis present

## 2021-11-15 DIAGNOSIS — E1121 Type 2 diabetes mellitus with diabetic nephropathy: Secondary | ICD-10-CM

## 2021-11-15 DIAGNOSIS — I2510 Atherosclerotic heart disease of native coronary artery without angina pectoris: Secondary | ICD-10-CM | POA: Diagnosis present

## 2021-11-15 DIAGNOSIS — Z79899 Other long term (current) drug therapy: Secondary | ICD-10-CM

## 2021-11-15 DIAGNOSIS — L97519 Non-pressure chronic ulcer of other part of right foot with unspecified severity: Secondary | ICD-10-CM | POA: Diagnosis present

## 2021-11-15 DIAGNOSIS — Z7982 Long term (current) use of aspirin: Secondary | ICD-10-CM

## 2021-11-15 DIAGNOSIS — M1990 Unspecified osteoarthritis, unspecified site: Secondary | ICD-10-CM | POA: Diagnosis present

## 2021-11-15 DIAGNOSIS — D631 Anemia in chronic kidney disease: Secondary | ICD-10-CM | POA: Diagnosis present

## 2021-11-15 DIAGNOSIS — E1142 Type 2 diabetes mellitus with diabetic polyneuropathy: Secondary | ICD-10-CM

## 2021-11-15 DIAGNOSIS — L304 Erythema intertrigo: Secondary | ICD-10-CM

## 2021-11-15 DIAGNOSIS — I25119 Atherosclerotic heart disease of native coronary artery with unspecified angina pectoris: Secondary | ICD-10-CM

## 2021-11-15 DIAGNOSIS — E876 Hypokalemia: Secondary | ICD-10-CM | POA: Diagnosis not present

## 2021-11-15 DIAGNOSIS — Z833 Family history of diabetes mellitus: Secondary | ICD-10-CM

## 2021-11-15 DIAGNOSIS — Z8616 Personal history of COVID-19: Secondary | ICD-10-CM

## 2021-11-15 DIAGNOSIS — G9341 Metabolic encephalopathy: Secondary | ICD-10-CM | POA: Diagnosis not present

## 2021-11-15 DIAGNOSIS — Z9049 Acquired absence of other specified parts of digestive tract: Secondary | ICD-10-CM

## 2021-11-15 DIAGNOSIS — I10 Essential (primary) hypertension: Secondary | ICD-10-CM

## 2021-11-15 DIAGNOSIS — E11621 Type 2 diabetes mellitus with foot ulcer: Secondary | ICD-10-CM | POA: Diagnosis present

## 2021-11-15 DIAGNOSIS — M869 Osteomyelitis, unspecified: Secondary | ICD-10-CM | POA: Diagnosis present

## 2021-11-15 DIAGNOSIS — Z66 Do not resuscitate: Secondary | ICD-10-CM | POA: Diagnosis present

## 2021-11-15 DIAGNOSIS — E1122 Type 2 diabetes mellitus with diabetic chronic kidney disease: Secondary | ICD-10-CM

## 2021-11-15 LAB — CBC WITH DIFFERENTIAL/PLATELET
Abs Immature Granulocytes: 0.12 10*3/uL — ABNORMAL HIGH (ref 0.00–0.07)
Basophils Absolute: 0.1 10*3/uL (ref 0.0–0.1)
Basophils Relative: 1 %
Eosinophils Absolute: 0.4 10*3/uL (ref 0.0–0.5)
Eosinophils Relative: 3 %
HCT: 29.6 % — ABNORMAL LOW (ref 39.0–52.0)
Hemoglobin: 9.8 g/dL — ABNORMAL LOW (ref 13.0–17.0)
Immature Granulocytes: 1 %
Lymphocytes Relative: 13 %
Lymphs Abs: 2.2 10*3/uL (ref 0.7–4.0)
MCH: 30.3 pg (ref 26.0–34.0)
MCHC: 33.1 g/dL (ref 30.0–36.0)
MCV: 91.6 fL (ref 80.0–100.0)
Monocytes Absolute: 1.4 10*3/uL — ABNORMAL HIGH (ref 0.1–1.0)
Monocytes Relative: 8 %
Neutro Abs: 12.5 10*3/uL — ABNORMAL HIGH (ref 1.7–7.7)
Neutrophils Relative %: 74 %
Platelets: 258 10*3/uL (ref 150–400)
RBC: 3.23 MIL/uL — ABNORMAL LOW (ref 4.22–5.81)
RDW: 13.9 % (ref 11.5–15.5)
WBC: 16.8 10*3/uL — ABNORMAL HIGH (ref 4.0–10.5)
nRBC: 0 % (ref 0.0–0.2)

## 2021-11-15 LAB — COMPREHENSIVE METABOLIC PANEL
ALT: 17 U/L (ref 0–44)
AST: 24 U/L (ref 15–41)
Albumin: 2.7 g/dL — ABNORMAL LOW (ref 3.5–5.0)
Alkaline Phosphatase: 96 U/L (ref 38–126)
Anion gap: 8 (ref 5–15)
BUN: 35 mg/dL — ABNORMAL HIGH (ref 8–23)
CO2: 23 mmol/L (ref 22–32)
Calcium: 8.5 mg/dL — ABNORMAL LOW (ref 8.9–10.3)
Chloride: 100 mmol/L (ref 98–111)
Creatinine, Ser: 1.88 mg/dL — ABNORMAL HIGH (ref 0.61–1.24)
GFR, Estimated: 35 mL/min — ABNORMAL LOW (ref >60)
Glucose, Bld: 326 mg/dL — ABNORMAL HIGH (ref 70–99)
Potassium: 4.2 mmol/L (ref 3.5–5.1)
Sodium: 131 mmol/L — ABNORMAL LOW (ref 135–145)
Total Bilirubin: 0.7 mg/dL (ref 0.3–1.2)
Total Protein: 8.7 g/dL — ABNORMAL HIGH (ref 6.5–8.1)

## 2021-11-15 LAB — CBG MONITORING, ED
Glucose-Capillary: 315 mg/dL — ABNORMAL HIGH (ref 70–99)
Glucose-Capillary: 305 mg/dL — ABNORMAL HIGH (ref 70–99)

## 2021-11-15 LAB — SEDIMENTATION RATE: Sed Rate: 118 mm/hr — ABNORMAL HIGH (ref 0–16)

## 2021-11-15 LAB — C-REACTIVE PROTEIN: CRP: 10.3 mg/dL — ABNORMAL HIGH (ref <1.0)

## 2021-11-15 LAB — LACTIC ACID, PLASMA: Lactic Acid, Venous: 1.5 mmol/L (ref 0.5–1.9)

## 2021-11-15 MED ORDER — INSULIN ASPART 100 UNIT/ML IJ SOLN
0.0000 [IU] | Freq: Every day | INTRAMUSCULAR | Status: DC
  Administered 2021-11-15: 4 [IU] via SUBCUTANEOUS
  Administered 2021-11-23: 3 [IU] via SUBCUTANEOUS
  Administered 2021-11-24: 4 [IU] via SUBCUTANEOUS

## 2021-11-15 MED ORDER — PANTOPRAZOLE SODIUM 40 MG PO TBEC
40.0000 mg | DELAYED_RELEASE_TABLET | Freq: Every day | ORAL | Status: DC
  Administered 2021-11-16 – 2021-11-18 (×3): 40 mg via ORAL
  Filled 2021-11-15 (×3): qty 1

## 2021-11-15 MED ORDER — LIDOCAINE HCL (PF) 1 % IJ SOLN: 5.0000 mL | Freq: Once | INTRAMUSCULAR | Status: DC

## 2021-11-15 MED ORDER — ATORVASTATIN CALCIUM 40 MG PO TABS
40.0000 mg | ORAL_TABLET | Freq: Every day | ORAL | Status: DC
  Administered 2021-11-16 – 2021-11-20 (×4): 40 mg via ORAL
  Filled 2021-11-15 (×4): qty 1

## 2021-11-15 MED ORDER — METRONIDAZOLE 500 MG PO TABS
500.0000 mg | ORAL_TABLET | Freq: Two times a day (BID) | ORAL | Status: DC
  Administered 2021-11-15 – 2021-11-16 (×3): 500 mg via ORAL
  Filled 2021-11-15 (×3): qty 1

## 2021-11-15 MED ORDER — VANCOMYCIN HCL 1750 MG/350ML IV SOLN
1750.0000 mg | INTRAVENOUS | Status: DC
  Administered 2021-11-17 – 2021-11-21 (×3): 1750 mg via INTRAVENOUS
  Filled 2021-11-15 (×3): qty 350

## 2021-11-15 MED ORDER — ACETAMINOPHEN 325 MG PO TABS
650.0000 mg | ORAL_TABLET | Freq: Four times a day (QID) | ORAL | Status: DC | PRN
  Administered 2021-11-18 – 2021-11-20 (×3): 650 mg via ORAL
  Filled 2021-11-15 (×3): qty 2

## 2021-11-15 MED ORDER — SODIUM CHLORIDE 0.9 % IV SOLN
2.0000 g | Freq: Two times a day (BID) | INTRAVENOUS | Status: DC
  Administered 2021-11-16 – 2021-11-19 (×7): 2 g via INTRAVENOUS
  Filled 2021-11-15 (×7): qty 12.5

## 2021-11-15 MED ORDER — FLUTICASONE FUROATE-VILANTEROL 100-25 MCG/ACT IN AEPB
1.0000 | INHALATION_SPRAY | Freq: Every day | RESPIRATORY_TRACT | Status: DC
  Administered 2021-11-17 – 2021-11-24 (×5): 1 via RESPIRATORY_TRACT
  Filled 2021-11-15 (×3): qty 28

## 2021-11-15 MED ORDER — PANCRELIPASE (LIP-PROT-AMYL) 12000-38000 UNITS PO CPEP
24000.0000 [IU] | ORAL_CAPSULE | Freq: Three times a day (TID) | ORAL | Status: DC
  Administered 2021-11-16 – 2021-11-20 (×10): 24000 [IU] via ORAL
  Filled 2021-11-15 (×12): qty 2

## 2021-11-15 MED ORDER — VANCOMYCIN HCL 1500 MG/300ML IV SOLN
1500.0000 mg | Freq: Once | INTRAVENOUS | Status: AC
  Administered 2021-11-15: 1500 mg via INTRAVENOUS
  Filled 2021-11-15: qty 300

## 2021-11-15 MED ORDER — ENOXAPARIN SODIUM 30 MG/0.3ML IJ SOSY
30.0000 mg | PREFILLED_SYRINGE | Freq: Every day | INTRAMUSCULAR | Status: DC
Start: 2021-11-16 — End: 2021-11-28
  Administered 2021-11-16 – 2021-11-27 (×11): 30 mg via SUBCUTANEOUS
  Filled 2021-11-15 (×11): qty 0.3

## 2021-11-15 MED ORDER — IPRATROPIUM-ALBUTEROL 0.5-2.5 (3) MG/3ML IN SOLN: 3.0000 mL | Freq: Four times a day (QID) | RESPIRATORY_TRACT | Status: DC | PRN

## 2021-11-15 MED ORDER — GABAPENTIN 100 MG PO CAPS
100.0000 mg | ORAL_CAPSULE | Freq: Every day | ORAL | Status: DC
Start: 2021-11-16 — End: 2021-11-19
  Administered 2021-11-16 – 2021-11-18 (×3): 100 mg via ORAL
  Filled 2021-11-15 (×3): qty 1

## 2021-11-15 MED ORDER — SODIUM CHLORIDE 0.9% FLUSH
3.0000 mL | Freq: Two times a day (BID) | INTRAVENOUS | Status: DC
  Administered 2021-11-16 – 2021-11-27 (×18): 3 mL via INTRAVENOUS

## 2021-11-15 MED ORDER — POLYETHYLENE GLYCOL 3350 17 G PO PACK: 17.0000 g | PACK | Freq: Every day | ORAL | Status: DC | PRN

## 2021-11-15 MED ORDER — NYSTATIN 100000 UNIT/GM EX POWD
Freq: Three times a day (TID) | CUTANEOUS | Status: DC
Start: 2021-11-15 — End: 2021-11-28
  Filled 2021-11-15 (×3): qty 15

## 2021-11-15 MED ORDER — ACETAMINOPHEN 650 MG RE SUPP: 650.0000 mg | Freq: Four times a day (QID) | RECTAL | Status: DC | PRN

## 2021-11-15 MED ORDER — GABAPENTIN 400 MG PO CAPS
400.0000 mg | ORAL_CAPSULE | Freq: Every day | ORAL | Status: DC
  Administered 2021-11-15 – 2021-11-18 (×4): 400 mg via ORAL
  Filled 2021-11-15 (×4): qty 1

## 2021-11-15 MED ORDER — SODIUM CHLORIDE 0.9 % IV SOLN
2.0000 g | Freq: Once | INTRAVENOUS | Status: AC
  Administered 2021-11-15: 2 g via INTRAVENOUS
  Filled 2021-11-15: qty 12.5

## 2021-11-15 MED ORDER — INSULIN GLARGINE-YFGN 100 UNIT/ML ~~LOC~~ SOLN
18.0000 [IU] | Freq: Every day | SUBCUTANEOUS | Status: DC
  Administered 2021-11-16 – 2021-11-17 (×2): 18 [IU] via SUBCUTANEOUS
  Filled 2021-11-15 (×2): qty 0.18

## 2021-11-15 MED ORDER — INSULIN ASPART 100 UNIT/ML IJ SOLN
0.0000 [IU] | Freq: Three times a day (TID) | INTRAMUSCULAR | Status: DC
  Administered 2021-11-17: 2 [IU] via SUBCUTANEOUS
  Administered 2021-11-18 – 2021-11-21 (×5): 1 [IU] via SUBCUTANEOUS
  Administered 2021-11-21 – 2021-11-22 (×4): 2 [IU] via SUBCUTANEOUS
  Administered 2021-11-23: 9 [IU] via SUBCUTANEOUS
  Administered 2021-11-23 (×2): 2 [IU] via SUBCUTANEOUS
  Administered 2021-11-24: 3 [IU] via SUBCUTANEOUS
  Administered 2021-11-24: 2 [IU] via SUBCUTANEOUS
  Administered 2021-11-25: 5 [IU] via SUBCUTANEOUS
  Administered 2021-11-25: 3 [IU] via SUBCUTANEOUS
  Administered 2021-11-25: 2 [IU] via SUBCUTANEOUS
  Administered 2021-11-26: 1 [IU] via SUBCUTANEOUS
  Administered 2021-11-26: 2 [IU] via SUBCUTANEOUS
  Administered 2021-11-27: 1 [IU] via SUBCUTANEOUS
  Administered 2021-11-27: 3 [IU] via SUBCUTANEOUS
  Administered 2021-11-27: 2 [IU] via SUBCUTANEOUS
  Administered 2021-11-28: 1 [IU] via SUBCUTANEOUS

## 2021-11-15 NOTE — ED Notes (Signed)
To XRAY

## 2021-11-15 NOTE — H&P (Signed)
History and Physical   Justin [name] WLS:[id number] DOB: [birth year] DOA: 11/15/2021  [facility]: [facility] [place]   Patient coming from: [facility]  Chief Complaint: Right leg and foot pain  HPI: Justin [name] is a 87 y.o. male with medical history significant of dementia, CKD 3B, hyperlipidemia, diabetes, neuropathy, history of CVA, CAD, hypertension, chronic pancreatitis, anemia, diabetic foot wound presenting with worsening right leg and foot pain.  History obtained with assistance of family and chart review due to patient's reported dementia.  Patient has been at rehab facility following her stroke in February.  Does have some residual right-sided deficits.  His right foot has been in pain for the past couple weeks per her daughter and has had some swelling in that period.  This has increased significantly over the past couple days with increased swelling overnight.  Does have known history of diabetic foot wounds per daughter.  Denies fevers, chills, chest pain, shortness of breath, Donnell pain, constipation, diarrhea, nausea, vomiting.**  ED Course: Vital signs in ED significant for temperature of 100.4, blood pressure in the 811X to 726 systolic.  Lab workup included CMP with sodium 131 which improved when to consider glucose of 326, BUN elevated at 35, creatinine stable at 1.8, calcium 8.5, protein 8.7, albumin 2.7.  CBC with leukocytosis to 16.8, hemoglobin stable at 9.8.  Lactic acid normal.  ESR elevated to 118.  CRP elevated to 10.3.  Blood cultures pending. Right foot x-ray showing ulcer with erosion of distal phalanx likely exposed bone, MRI of the right foot ordered.  Podiatry has been consulted by me and will see the patient tomorrow.  Review of Systems: As per HPI otherwise all other systems reviewed and are negative.  Past Medical History:  Diagnosis Date   Acute metabolic encephalopathy 05/13/5595   Arthritis    Diabetes mellitus    HTN (hypertension)     Hypercholesteremia    Iron deficiency    Pancreatitis, acute 06/02/2012   Diagnosed 05/2012    Personal history of colonic adenomas 04/15/2013   04/15/2013 2 diminutive polyps     Stroke [facility])    TIA (transient ischemic attack)     Past Surgical History:  Procedure Laterality Date   ARTERY BIOPSY  02/26/2011   Procedure: BIOPSY TEMPORAL ARTERY;  Surgeon: Willey Blade, MD;  Location: [facility];  Service: General;  Laterality: Left;  Left temporal artery biospy   [place] / LIGATION  02/26/2011   left side   UPPER GASTROINTESTINAL ENDOSCOPY      Social History  reports that he quit smoking about 31 years ago. His smoking use included cigarettes. He has never used smokeless tobacco. He reports that he does not drink alcohol and does not use drugs.  No Known Allergies  Family History  Problem Relation Age of Onset   Heart disease Mother    Amblyopia Father    Diabetes Sister        x 2   Heart attack Sister   Reviewed on admission  Prior to Admission medications   Medication Sig Start Date End Date Taking? Authorizing Provider  acetaminophen (TYLENOL) 325 MG tablet Take 650 mg by mouth daily.    [provider]  Alcohol Swabs PADS Use to check blood sugar 3 times daily. Dx: E10.9 07/21/15  Ivar Drape D, PA  aspirin 81 MG chewable tablet Chew 81 mg by mouth daily.    [provider]  atorvastatin (LIPITOR) 80 MG tablet Take 1 tablet (80 mg total) by mouth daily. 05/25/21 06/24/21  Patrecia Pour, MD  azelastine (ASTELIN) 0.1 % nasal spray Place 2 sprays into both nostrils 2 (two) times daily. Use in each nostril as directed    [provider]  Blood Glucose Calibration (ACCU-CHEK AVIVA) SOLN Use to check blood sugar 3 times daily. Dx: E10.9 07/21/15   Ivar Drape D, PA  Blood Glucose Monitoring Suppl (ACCU-CHEK AVIVA PLUS)  w/Device KIT Use to check blood sugar 3 times daily. Dx: E10.9 09/27/15   Wardell Honour, MD  cholecalciferol (VITAMIN D3) 25 MCG (1000 UNIT) tablet Take 1,000 Units by mouth daily.    [provider]  ferrous sulfate 325 (65 FE) MG tablet Take 325 mg by mouth daily with breakfast.    [provider]  fluticasone (FLONASE) 50 MCG/ACT nasal spray Place into both nostrils daily.    [provider]  fluticasone furoate-vilanterol (BREO ELLIPTA) 100-25 MCG/ACT AEPB Inhale 1 puff into the lungs daily.    [provider]  gabapentin (NEURONTIN) 100 MG capsule Take 100 mg by mouth daily. **2 capsules once daily** 04/17/21   [provider]  gabapentin (NEURONTIN) 400 MG capsule Take 400 mg by mouth at bedtime. 05/01/21   [provider]  glucagon (GLUCAGON EMERGENCY) 1 MG injection Inject 1 mg into the muscle once as needed. 09/29/15   Philemon Kingdom, MD  glucose blood (ACCU-CHEK AVIVA PLUS) test strip Use to check blood sugar 3 times daily. Dx: E10.9 11/17/15   Philemon Kingdom, MD  guaifenesin (HUMIBID E) 400 MG TABS tablet Take 400 mg by mouth every 6 (six) hours as needed (congestion).    [provider]  guaiFENesin (ROBITUSSIN) 100 MG/5ML liquid Take 5 mLs by mouth every 6 (six) hours as needed for cough or to loosen phlegm.    [provider]  HUMALOG KWIKPEN 100 UNIT/ML KwikPen Inject 12 Units into the skin 4 (four) times daily -  before meals and at bedtime. If BS is less than 70 call NP/PA  BS 70-100 0 units  BS 201-250 2 units  BS 251-300 4 units  BS 301-350 6 UNITS  BS 351-400 8 UNITS  BS 401-450 10 UNITS  BS 451-600 12 UNITS 02/27/21   [provider]  insulin glargine (LANTUS) 100 UNIT/ML injection Inject 38 Units into the skin daily.    [provider]  Insulin Pen Needle 32G X 4 MM MISC Use as directed to inject insulin tidwc 12/14/12   Shawnee Knapp, MD  Ipratropium-Albuterol (COMBIVENT) 20-100 MCG/ACT  AERS respimat Inhale 1 puff into the lungs every 6 (six) hours as needed for wheezing. 05/25/21   Patrecia Pour, MD  isosorbide dinitrate (ISORDIL) 10 MG tablet Take 10 mg by mouth at bedtime.    [provider]  Lancets (ACCU-CHEK SOFT TOUCH) lancets Use to check blood sugar 3 times daily. Dx: E10.9 07/21/15   Ivar Drape D, PA  lidocaine (LMX) 4 % cream Apply 1 application. topically as needed.    [provider]  lipase/protease/amylase (CREON) 12000-38000 units CPEP capsule Take 24,000 Units by mouth 3 (three) times daily before meals.    [provider]  loratadine (CLARITIN) 10 MG tablet Take 10 mg by mouth daily.    [provider]  Multiple Vitamin (MULTI-VITAMIN  DAILY) TABS Take 1 tablet by mouth daily.    [provider]  omeprazole (PRILOSEC) 20 MG capsule Take 20 mg by mouth every other day.    [provider]  polyethylene glycol (MIRALAX / GLYCOLAX) 17 g packet Take 17 g by mouth daily.    [provider]  Spacer/Aero-Holding Dorise Bullion Use as directed with combivent. Ok to dispense any spacer that works with the inhaler being rxed to pt. 09/20/15   Shawnee Knapp, MD  TRULICITY 1.5 XI/3.5WY SOPN Inject 1.5 mg into the skin once a week. Fridays 05/19/21   [provider]  vitamin C (ASCORBIC ACID) 500 MG tablet Take 500 mg by mouth daily.    [provider]  testosterone cypionate (DEPOTESTOTERONE CYPIONATE) 200 MG/ML injection Inject into the muscle every 14 (fourteen) days.  05/24/11  [provider]    Physical Exam: Vitals:   11/15/21 1708 11/15/21 2013  BP: (!) 146/85 (!) 172/81  Pulse: 90 92  Resp: 20 20  Temp: 99 F (37.2 C) (!) 100.4 F (38 C)  TempSrc: Oral Oral  SpO2: 100% 96%    Physical Exam Constitutional:      General: He is not in acute distress.    Appearance: Normal appearance.  HENT:     Head: Normocephalic and atraumatic.     Mouth/Throat:     Mouth: Mucous  membranes are moist.     Pharynx: Oropharynx is clear.  Eyes:     Extraocular Movements: Extraocular movements intact.     Pupils: Pupils are equal, round, and reactive to light.  Cardiovascular:     Rate and Rhythm: Normal rate and regular rhythm.     Pulses: Normal pulses.     Heart sounds: Normal heart sounds.  Pulmonary:     Effort: Pulmonary effort is normal. No respiratory distress.     Breath sounds: Normal breath sounds.  Abdominal:     General: Bowel sounds are normal. There is no distension.     Palpations: Abdomen is soft.     Tenderness: There is no abdominal tenderness.  Musculoskeletal:        General: Swelling present. No deformity.     Comments: Right lower extremity with erythema, edema, warmth, greatest at distal foot to ankle.  Ulcerations of the toes noted.  Skin:    General: Skin is warm and dry.  Neurological:     General: No focal deficit present.     Mental Status: Mental status is at baseline.    Labs on Admission: I have personally reviewed following labs and imaging studies  CBC: Recent Labs  Lab 11/15/21 1839  WBC 16.8*  NEUTROABS 12.5*  HGB 9.8*  HCT 29.6*  MCV 91.6  PLT 616    Basic Metabolic Panel: Recent Labs  Lab 11/15/21 1839  NA 131*  K 4.2  CL 100  CO2 23  GLUCOSE 326*  BUN 35*  CREATININE 1.88*  CALCIUM 8.5*    GFR: CrCl cannot be calculated (Unknown ideal weight.).  Liver Function Tests: Recent Labs  Lab 11/15/21 1839  AST 24  ALT 17  ALKPHOS 96  BILITOT 0.7  PROT 8.7*  ALBUMIN 2.7*    Urine analysis:    Component Value Date/Time   COLORURINE YELLOW 05/21/2021 0840   APPEARANCEUR CLOUDY (A) 05/21/2021 0840   LABSPEC 1.012 05/21/2021 0840   PHURINE 7.0 05/21/2021 0840   GLUCOSEU NEGATIVE 05/21/2021 0840   HGBUR SMALL (A) 05/21/2021 0840   BILIRUBINUR NEGATIVE  05/21/2021 0840   BILIRUBINUR negative 09/27/2015 1629   BILIRUBINUR neg 07/01/2012 0902   KETONESUR NEGATIVE 05/21/2021 0840   PROTEINUR 100  (A) 05/21/2021 0840   UROBILINOGEN 4.0 09/27/2015 1629   NITRITE NEGATIVE 05/21/2021 0840   LEUKOCYTESUR LARGE (A) 05/21/2021 0840    Radiological Exams on Admission: DG Foot Complete Right  Result Date: 11/15/2021 CLINICAL DATA:  Right foot pain EXAM: RIGHT FOOT COMPLETE - 3+ VIEW COMPARISON:  None Available. FINDINGS: Osseous structures are diffusely osteopenic. Ulcer involving the a distal aspect of the right great toe noted with erosion of the terminal distal phalanx which is likely exposed at the base of the ulcer. No acute fracture or dislocation. Advanced vascular calcifications are seen within the right foot. IMPRESSION: Ulcer involving the distal aspect of the right great toe with erosion of the terminal distal phalanx, likely exposed at the base of the ulcer. Electronically Signed   By: Fidela Salisbury M.D.   On: 11/15/2021 19:46    EKG: Not performed in the emergency department.  Assessment/Plan Principal Problem:   Diabetic foot infection () Active Problems:   Chronic kidney disease, stage 3b ([name])   Mixed diabetic hyperlipidemia associated with type 2 diabetes mellitus ([name])   Polyneuropathy due to type 2 diabetes mellitus ([place])   Coronary artery disease   Essential hypertension   Chronic pancreatitis ([name])   Anemia of chronic disease   Diabetic peripheral neuropathy ([name])   Type 2 diabetes mellitus with stage 3b chronic kidney disease, with long-term current use of insulin ([name])   History of CVA (cerebrovascular accident)   Diabetic foot infection ?Osteomyelitis > Patient presenting with redness warmth and swelling of the right lower extremity with wound at the base of the foot.  Imaging showing evidence of possible bone exposure, on exam warmth redness and swelling of right lower extremity especially foot, ulcerations on the toes. > ESR and CRP noted to be elevated in the ED in addition to the leukocytosis of 16.8. > Patient started on cefepime and Flagyl and MRI has  been ordered in the ED. patient also received dose of vancomycin. > I spoke with podiatry who will see the patient in the morning. - Monitor on telemetry - Appreciate podiatry recommendations and assistance - Continue cefepime and Flagyl - MRSA screen, if positive will continue vancomycin for now - Trend CBC - Follow-up blood cultures - Pain control as needed  Intertrigo > Noted of his groin while in the ED. - Start nystatin powder 3 times daily  Hyperlipidemia CAD History of CVA - Continue home atorvastatin and aspirin - Continue home isosorbide  Chronic pancreatitis - Continue home Creon  Diabetes Diabetic neuropathy > 24 units daily and SSI at facility - 18 units daily - SSI - Continue home gabapentin  CKD 3B > Creatinine stable 1.88 in the ED - Trend renal function electrolytes  Dementia - Noted   DVT prophylaxis: Lovenox Code Status:   DNR Family Communication:  Updated at bedside Disposition Plan:   [facility] is [facility]  Anticipated DC to:  Same as above  Anticipated DC date:  1 to 4 days  Anticipated DC barriers: None  Consults called:  Podiatry, will see in the morning Admission status:  Observation, telemetry  Severity of Illness: The appropriate patient status for this patient is OBSERVATION. Observation status is judged to be reasonable and necessary in order to provide the required intensity of service to ensure the patient's safety. The patient's presenting symptoms, physical  exam findings, and initial radiographic and laboratory data in the context of their medical condition is felt to place them at decreased risk for further clinical deterioration. Furthermore, it is anticipated that the patient will be medically stable for discharge from the hospital within 2 midnights of admission.    Marcelyn Bruins MD [facility]  How to contact the [facility] Attending or Consulting provider [place] or covering provider during after hours [place], for this patient?   Check the care team in [facility] and look for a) attending/consulting [facility] provider listed and b) the [facility] team listed Log into [email or web address] and use [place]'s universal password to access. If you do not have the password, please contact the hospital operator. Locate the [facility] provider you are looking for under [facility] and page to a number that you can be directly reached. If you still have difficulty reaching the provider, please page the [facility] (Director on Call) for the Hospitalists listed on amion for assistance.  11/15/2021, 9:39 PM

## 2021-11-15 NOTE — ED Notes (Signed)
Consulted phlebotomy for 2nd set of [facility]

## 2021-11-15 NOTE — ED Notes (Signed)
Patient to MRI.

## 2021-11-15 NOTE — ED Triage Notes (Signed)
Pt bib [facility] from [facility] with complaints of right leg and foot pain. Pt is at this facility after having a stroke two weeks ago with right sided deficits. Pt states that his right foot has been swollen but got worse over night. Pt AOx4 VSS

## 2021-11-15 NOTE — Progress Notes (Addendum)
Pharmacy Antibiotic Note  Justin [name] is a 87 y.o. male for which pharmacy has been consulted for cefepime and vancomycin dosing for  foot infection .  Patient with a history of CVA, DM, HTN, hypercholesterolemia, COPD. Patient presenting with rt foot pain.  SCr 1.88 ~1.6-1.7 in Feb WBC 16.8; LA 1.5; T 100.4 F; HR 92; RR 20  Plan: Flagyl per MD Cefepime 2g q12hr Vancomycin 1500 mg given in the ED - Will start 1750 mg q48hr (eAUC 516.3) unless change in renal function Trend WBC, Fever, Renal function, & Clinical course F/u cultures, clinical course, WBC, fever De-escalate when able     Temp (24hrs), Avg:99 F (37.2 C), Min:99 F (37.2 C), Max:99 F (37.2 C)  No results for input(s): "WBC", "CREATININE", "LATICACIDVEN", "VANCOTROUGH", "VANCOPEAK", "VANCORANDOM", "GENTTROUGH", "GENTPEAK", "GENTRANDOM", "TOBRATROUGH", "TOBRAPEAK", "TOBRARND", "AMIKACINPEAK", "AMIKACINTROU", "AMIKACIN" in the last 168 hours.  CrCl cannot be calculated (Patient's most recent lab result is older than the maximum 21 days allowed.).    No Known Allergies  Antimicrobials this admission: cefepime 8/9 >>  Vancomycin 8/9 >> Flagyl 8/9 >>  Microbiology results: Pending  Thank you for allowing pharmacy to be a part of this patient's care.  Lorelei Pont, PharmD, BCPS 11/15/2021 7:24 PM ED Clinical Pharmacist -  [phone]

## 2021-11-15 NOTE — ED Notes (Signed)
This NT changed the sheets and adult diaper and noticed reddened areas under stomach skin folds on the LRQ of the stomach. Area was moist with a rash. I notified the nurse to take a look for herself to see the area and assess further.

## 2021-11-15 NOTE — ED Provider Notes (Signed)
[facility] EMERGENCY DEPARTMENT Provider Note   CSN: [id number] Arrival date & time: 11/15/21  1649     History  Chief Complaint  Patient presents with   Foot Pain    Justin [name] is a 87 y.o. male.  Patient is an 87 year old male with a recent history of CVA, uncontrolled diabetes, hypertension, hypercholesterolemia, COPD who presents to the ED via EMS from assisted living for evaluation of right foot pain.  Patient reportedly has dementia and much of the history is obtained from daughter Justin [name] who is at bedside. What is obtained by patient may not be reliable.  Daughter is also durable POA per her report.  Daughter reports that patient started complaining of right foot pain approximately 2 weeks ago.  She reports swelling has increased during that time and she brought this up to assisted living home who reportedly ordered Doppler ultrasound on right lower extremity. These results were not found.  Daughter reports that patient has been followed by podiatry and was most recently seen at the beginning of the summer. Podiatry reports not found in [place].  Reports patient has had diabetic foot infections in the past and has been treated for those.  Patient states pain got significantly worse last night before he went to bed.  When he woke up this morning patient reports the swelling had increased greatly.  When asked, patient denies chest pain, new or worsening shortness of breath, fevers, rigors, abdominal pain.   Foot Pain       Home Medications Prior to Admission medications   Medication Sig Start Date End Date Taking? Authorizing Provider  acetaminophen (TYLENOL) 325 MG tablet Take 650 mg by mouth daily.    [provider]  Alcohol Swabs PADS Use to check blood sugar 3 times daily. Dx: E10.9 07/21/15   Ivar Drape D, PA  aspirin 81 MG chewable tablet Chew 81 mg by mouth daily.    [provider]  atorvastatin (LIPITOR) 80 MG tablet Take 1  tablet (80 mg total) by mouth daily. 05/25/21 06/24/21  Patrecia Pour, MD  azelastine (ASTELIN) 0.1 % nasal spray Place 2 sprays into both nostrils 2 (two) times daily. Use in each nostril as directed    [provider]  Blood Glucose Calibration (ACCU-CHEK AVIVA) SOLN Use to check blood sugar 3 times daily. Dx: E10.9 07/21/15   Ivar Drape D, PA  Blood Glucose Monitoring Suppl (ACCU-CHEK AVIVA PLUS) w/Device KIT Use to check blood sugar 3 times daily. Dx: E10.9 09/27/15   Wardell Honour, MD  cholecalciferol (VITAMIN D3) 25 MCG (1000 UNIT) tablet Take 1,000 Units by mouth daily.    [provider]  ferrous sulfate 325 (65 FE) MG tablet Take 325 mg by mouth daily with breakfast.    [provider]  fluticasone (FLONASE) 50 MCG/ACT nasal spray Place into both nostrils daily.    [provider]  fluticasone furoate-vilanterol (BREO ELLIPTA) 100-25 MCG/ACT AEPB Inhale 1 puff into the lungs daily.    [provider]  gabapentin (NEURONTIN) 100 MG capsule Take 100 mg by mouth daily. **2 capsules once daily** 04/17/21   [provider]  gabapentin (NEURONTIN) 400 MG capsule Take 400 mg by mouth at bedtime. 05/01/21   [provider]  glucagon (GLUCAGON EMERGENCY) 1 MG injection Inject 1 mg into the muscle once as needed. 09/29/15   Philemon Kingdom, MD  glucose blood (ACCU-CHEK AVIVA PLUS) test strip Use to check blood sugar 3 times daily.  Dx: E10.9 11/17/15   Philemon Kingdom, MD  guaifenesin (HUMIBID E) 400 MG TABS tablet Take 400 mg by mouth every 6 (six) hours as needed (congestion).    [provider]  guaiFENesin (ROBITUSSIN) 100 MG/5ML liquid Take 5 mLs by mouth every 6 (six) hours as needed for cough or to loosen phlegm.    [provider]  HUMALOG KWIKPEN 100 UNIT/ML KwikPen Inject 12 Units into the skin 4 (four) times daily -  before meals and at bedtime. If BS is less than 70 call NP/PA  BS 70-100 0 units  BS  201-250 2 units  BS 251-300 4 units  BS 301-350 6 UNITS  BS 351-400 8 UNITS  BS 401-450 10 UNITS  BS 451-600 12 UNITS 02/27/21   [provider]  insulin glargine (LANTUS) 100 UNIT/ML injection Inject 38 Units into the skin daily.    [provider]  Insulin Pen Needle 32G X 4 MM MISC Use as directed to inject insulin tidwc 12/14/12   Shawnee Knapp, MD  Ipratropium-Albuterol (COMBIVENT) 20-100 MCG/ACT AERS respimat Inhale 1 puff into the lungs every 6 (six) hours as needed for wheezing. 05/25/21   Patrecia Pour, MD  isosorbide dinitrate (ISORDIL) 10 MG tablet Take 10 mg by mouth at bedtime.    [provider]  Lancets (ACCU-CHEK SOFT TOUCH) lancets Use to check blood sugar 3 times daily. Dx: E10.9 07/21/15   Ivar Drape D, PA  lidocaine (LMX) 4 % cream Apply 1 application. topically as needed.    [provider]  lipase/protease/amylase (CREON) 12000-38000 units CPEP capsule Take 24,000 Units by mouth 3 (three) times daily before meals.    [provider]  loratadine (CLARITIN) 10 MG tablet Take 10 mg by mouth daily.    [provider]  Multiple Vitamin (MULTI-VITAMIN DAILY) TABS Take 1 tablet by mouth daily.    [provider]  omeprazole (PRILOSEC) 20 MG capsule Take 20 mg by mouth every other day.    [provider]  polyethylene glycol (MIRALAX / GLYCOLAX) 17 g packet Take 17 g by mouth daily.    [provider]  Spacer/Aero-Holding Dorise Bullion Use as directed with combivent. Ok to dispense any spacer that works with the inhaler being rxed to pt. 09/20/15   Shawnee Knapp, MD  TRULICITY 1.5 FV/4.9SW SOPN Inject 1.5 mg into the skin once a week. Fridays 05/19/21   [provider]  vitamin C (ASCORBIC ACID) 500 MG tablet Take 500 mg by mouth daily.    [provider]  testosterone cypionate (DEPOTESTOTERONE CYPIONATE) 200 MG/ML injection Inject into the muscle every 14 (fourteen) days.  05/24/11   [provider]      Allergies    Patient has no known allergies.    Review of Systems   Review of Systems  Physical Exam Updated Vital Signs BP (!) 146/85 (BP Location: Right Arm)   Pulse 90   Temp 99 F (37.2 C) (Oral)   Resp 20   SpO2 100%  Physical Exam Vitals and nursing note reviewed.  Constitutional:      General: He is not in acute distress.    Appearance: He is well-developed. He is not ill-appearing, toxic-appearing or diaphoretic.  HENT:     Head: Normocephalic and atraumatic.  Eyes:     Conjunctiva/sclera: Conjunctivae normal.  Cardiovascular:     Rate and Rhythm: Normal rate and regular rhythm.     Heart sounds: No murmur heard. Pulmonary:  Effort: Pulmonary effort is normal. No respiratory distress.     Comments: Distant breath sounds Abdominal:     Palpations: Abdomen is soft.     Tenderness: There is no abdominal tenderness.  Musculoskeletal:        General: Swelling and tenderness present.     Cervical back: Neck supple.     Right lower leg: Edema present.     Left lower leg: No edema.     Comments: S/P CVA, unable to ambulate  Feet:     Right foot:     Skin integrity: Erythema and warmth present.     Toenail Condition: Right toenails are abnormally thick.     Left foot:     Skin integrity: No erythema or warmth.     Toenail Condition: Left toenails are abnormally thick.     Comments: Right lower extremity edematous up to mid calf, left lower extremity normal. Multiple nonhealing wounds on dorsum of first second and third toe.  This was wrapped with gauze on arrival.  Daughter showed pictures of wounds from earlier today with purulent drainage.  Skin:    General: Skin is warm and dry.     Capillary Refill: Capillary refill takes less than 2 seconds.     Findings: Erythema (Right foot) present.  Neurological:     Mental Status: He is alert. Mental status is at baseline.     Comments: Confused at times, history of dementia   Psychiatric:        Mood and Affect: Mood normal.     ED Results / Procedures / Treatments   Labs (all labs ordered are listed, but only abnormal results are displayed) Labs Reviewed  CBC WITH DIFFERENTIAL/PLATELET  COMPREHENSIVE METABOLIC PANEL  LACTIC ACID, PLASMA    EKG None  Radiology No results found.  Procedures Procedures    Medications Ordered in ED Medications - No data to display  ED Course/ Medical Decision Making/ A&P Clinical Course as of 11/15/21 2230  Wed Nov 15, 2021  1938 CBC with Differential(!) Leukocytosis of 16.8.  Anemia hemoglobin of 9.8 which is stable from last labs. [AS]  1957 DG Foot Complete Right I personally reviewed and interpreted the image.  Ulceration of the right great toe, erosion of the distal phalanx.  Osteopenic abnormalities all osseous structures [AS]  2003 Comprehensive metabolic panel(!) Hyperglycemia 326.  Creatinine 1.88 at baseline [AS]  2144 Upon reevaluation patient is sleeping and resting comfortably in bed.  I discussed plan for admission and continue antibiotics along with workup for osteomyelitis with daughter at bedside.  She is agreeable to this plan.  States she will go home soon and come back to visit tomorrow. [AS]    Clinical Course User Index [AS] Brittanee Ghazarian, [place] Folk, PA-C                           Medical Decision Making This patient presents to the ED for concern of right lower extremity pain, edema, erythema, warmth, this involves an extensive number of treatment options, and is a complaint that carries with it a high risk of complications and morbidity.  The differential diagnosis includes osteomyelitis, diabetic foot infection/cellulitis, DVT, Charcot foot,    Co morbidities that complicate the patient evaluation       Prior CVA, uncontrolled diabetes, hypertension, hyperlipidemia, hypercholesterolemia, arthritis  My initial workup includes CBC, CMP, lactate, ESR, CRP, x-ray right foot to evaluate for  possibility of deep soft tissue  infection or osteomyelitis.  Nursing home paperwork showed daily average blood glucose levels between 250 and 350.  Due to likely diagnosis of cellulitis of the right foot, we will start vancomycin and cefepime at this time.  Additional history obtained from: Nursing notes from this visit. Previous records within EMR system  Family daughter Justin [name] who states she is his durable POA Nursing home paperwork  I ordered, reviewed and interpreted labs which include: CBC, CMP, ESR, CRP, lactate.  White blood cell count of 16.8 likely correlating to skin soft tissue infection of right foot.  CRP 10.3, ESR 118.  Hemoglobin 9.8 is stable from previous labs.  Creatinine 1.88 stable from previous labs.   I ordered imaging studies including x ray right foot I independently visualized and interpreted imaging which showed ulceration of right great toe with erosion of distal phalanx.  Osteopenic changes of all osseous structures. I agree with the radiologist interpretation  Consultations Obtained:  I requested consultation with the unassigned hospitalist, Dr. Trilby Drummer,  and discussed lab and imaging findings as well as pertinent plan - they recommend: Admission and continued IV antibiotics, they will consult podiatry.  Due to patient's advanced age, uncontrolled diabetes, lab work suggesting infection, ulceration and erosions of the foot seen on x-ray, history of previous diabetic foot infections, and new fever in the ED I believe this patient requires admission for IV antibiotics and further workup for osteomyelitis of the right foot including MRI. I have ordered the MRI, it has not been completed at the time of admission.    Patient's case discussed with Dr. Ronnald Nian who agrees with plan to admit.  Note: Portions of this report may have been transcribed using voice recognition software. Every effort was made to ensure accuracy; however, inadvertent computerized transcription  errors may still be present.   Amount and/or Complexity of Data Reviewed Labs: ordered. Radiology: ordered.          Final Clinical Impression(s) / ED Diagnoses Final diagnoses:  None    Rx / DC Orders ED Discharge Orders     None         Roylene Reason, Hershal Coria 11/15/21 [place], [place], DO 11/15/21 2247

## 2021-11-16 ENCOUNTER — Observation Stay (HOSPITAL_COMMUNITY): Payer: Medicare Other

## 2021-11-16 ENCOUNTER — Encounter (HOSPITAL_COMMUNITY): Payer: Self-pay | Admitting: Internal Medicine

## 2021-11-16 DIAGNOSIS — M869 Osteomyelitis, unspecified: Secondary | ICD-10-CM | POA: Diagnosis present

## 2021-11-16 DIAGNOSIS — E11628 Type 2 diabetes mellitus with other skin complications: Secondary | ICD-10-CM | POA: Diagnosis not present

## 2021-11-16 DIAGNOSIS — E1169 Type 2 diabetes mellitus with other specified complication: Secondary | ICD-10-CM | POA: Diagnosis present

## 2021-11-16 DIAGNOSIS — E1122 Type 2 diabetes mellitus with diabetic chronic kidney disease: Secondary | ICD-10-CM | POA: Diagnosis present

## 2021-11-16 DIAGNOSIS — F0390 Unspecified dementia without behavioral disturbance: Secondary | ICD-10-CM | POA: Diagnosis present

## 2021-11-16 DIAGNOSIS — D631 Anemia in chronic kidney disease: Secondary | ICD-10-CM | POA: Diagnosis present

## 2021-11-16 DIAGNOSIS — L089 Local infection of the skin and subcutaneous tissue, unspecified: Secondary | ICD-10-CM | POA: Diagnosis not present

## 2021-11-16 DIAGNOSIS — I96 Gangrene, not elsewhere classified: Secondary | ICD-10-CM | POA: Diagnosis not present

## 2021-11-16 DIAGNOSIS — E11621 Type 2 diabetes mellitus with foot ulcer: Secondary | ICD-10-CM | POA: Diagnosis present

## 2021-11-16 DIAGNOSIS — Z66 Do not resuscitate: Secondary | ICD-10-CM | POA: Diagnosis present

## 2021-11-16 DIAGNOSIS — L0390 Cellulitis, unspecified: Secondary | ICD-10-CM | POA: Diagnosis not present

## 2021-11-16 DIAGNOSIS — Z8616 Personal history of COVID-19: Secondary | ICD-10-CM | POA: Diagnosis not present

## 2021-11-16 DIAGNOSIS — M79604 Pain in right leg: Secondary | ICD-10-CM

## 2021-11-16 DIAGNOSIS — N1832 Chronic kidney disease, stage 3b: Secondary | ICD-10-CM | POA: Diagnosis present

## 2021-11-16 DIAGNOSIS — I70261 Atherosclerosis of native arteries of extremities with gangrene, right leg: Secondary | ICD-10-CM | POA: Diagnosis present

## 2021-11-16 DIAGNOSIS — E118 Type 2 diabetes mellitus with unspecified complications: Secondary | ICD-10-CM | POA: Diagnosis present

## 2021-11-16 DIAGNOSIS — G9341 Metabolic encephalopathy: Secondary | ICD-10-CM | POA: Diagnosis not present

## 2021-11-16 DIAGNOSIS — L97519 Non-pressure chronic ulcer of other part of right foot with unspecified severity: Secondary | ICD-10-CM | POA: Diagnosis present

## 2021-11-16 DIAGNOSIS — K861 Other chronic pancreatitis: Secondary | ICD-10-CM | POA: Diagnosis present

## 2021-11-16 DIAGNOSIS — E1165 Type 2 diabetes mellitus with hyperglycemia: Secondary | ICD-10-CM | POA: Diagnosis present

## 2021-11-16 DIAGNOSIS — E1142 Type 2 diabetes mellitus with diabetic polyneuropathy: Secondary | ICD-10-CM | POA: Diagnosis present

## 2021-11-16 DIAGNOSIS — Z794 Long term (current) use of insulin: Secondary | ICD-10-CM | POA: Diagnosis not present

## 2021-11-16 DIAGNOSIS — J440 Chronic obstructive pulmonary disease with acute lower respiratory infection: Secondary | ICD-10-CM | POA: Diagnosis present

## 2021-11-16 DIAGNOSIS — R4182 Altered mental status, unspecified: Secondary | ICD-10-CM | POA: Diagnosis not present

## 2021-11-16 DIAGNOSIS — J189 Pneumonia, unspecified organism: Secondary | ICD-10-CM | POA: Diagnosis present

## 2021-11-16 DIAGNOSIS — L304 Erythema intertrigo: Secondary | ICD-10-CM | POA: Diagnosis present

## 2021-11-16 DIAGNOSIS — E1152 Type 2 diabetes mellitus with diabetic peripheral angiopathy with gangrene: Secondary | ICD-10-CM | POA: Diagnosis present

## 2021-11-16 DIAGNOSIS — I129 Hypertensive chronic kidney disease with stage 1 through stage 4 chronic kidney disease, or unspecified chronic kidney disease: Secondary | ICD-10-CM | POA: Diagnosis present

## 2021-11-16 DIAGNOSIS — L602 Onychogryphosis: Secondary | ICD-10-CM | POA: Diagnosis present

## 2021-11-16 DIAGNOSIS — E876 Hypokalemia: Secondary | ICD-10-CM | POA: Diagnosis not present

## 2021-11-16 DIAGNOSIS — K219 Gastro-esophageal reflux disease without esophagitis: Secondary | ICD-10-CM | POA: Diagnosis present

## 2021-11-16 LAB — GLUCOSE, CAPILLARY: Glucose-Capillary: 86 mg/dL (ref 70–99)

## 2021-11-16 LAB — CBC
HCT: 30 % — ABNORMAL LOW (ref 39.0–52.0)
Hemoglobin: 9.9 g/dL — ABNORMAL LOW (ref 13.0–17.0)
MCH: 29.7 pg (ref 26.0–34.0)
MCHC: 33 g/dL (ref 30.0–36.0)
MCV: 90.1 fL (ref 80.0–100.0)
Platelets: 257 10*3/uL (ref 150–400)
RBC: 3.33 MIL/uL — ABNORMAL LOW (ref 4.22–5.81)
RDW: 13.8 % (ref 11.5–15.5)
WBC: 16.3 10*3/uL — ABNORMAL HIGH (ref 4.0–10.5)
nRBC: 0 % (ref 0.0–0.2)

## 2021-11-16 LAB — COMPREHENSIVE METABOLIC PANEL
ALT: 17 U/L (ref 0–44)
AST: 17 U/L (ref 15–41)
Albumin: 2.5 g/dL — ABNORMAL LOW (ref 3.5–5.0)
Alkaline Phosphatase: 92 U/L (ref 38–126)
Anion gap: 7 (ref 5–15)
BUN: 30 mg/dL — ABNORMAL HIGH (ref 8–23)
CO2: 21 mmol/L — ABNORMAL LOW (ref 22–32)
Calcium: 8.6 mg/dL — ABNORMAL LOW (ref 8.9–10.3)
Chloride: 104 mmol/L (ref 98–111)
Creatinine, Ser: 1.83 mg/dL — ABNORMAL HIGH (ref 0.61–1.24)
GFR, Estimated: 36 mL/min — ABNORMAL LOW (ref >60)
Glucose, Bld: 156 mg/dL — ABNORMAL HIGH (ref 70–99)
Potassium: 3.5 mmol/L (ref 3.5–5.1)
Sodium: 132 mmol/L — ABNORMAL LOW (ref 135–145)
Total Bilirubin: 0.7 mg/dL (ref 0.3–1.2)
Total Protein: 8.7 g/dL — ABNORMAL HIGH (ref 6.5–8.1)

## 2021-11-16 LAB — CBG MONITORING, ED
Glucose-Capillary: 118 mg/dL — ABNORMAL HIGH (ref 70–99)
Glucose-Capillary: 92 mg/dL (ref 70–99)

## 2021-11-16 LAB — MRSA NEXT GEN BY PCR, NASAL: MRSA by PCR Next Gen: DETECTED — AB

## 2021-11-16 NOTE — Progress Notes (Signed)
VASCULAR LAB    ABI has been performed.  See CV proc for preliminary results.   Ethelene Closser, RVT 11/16/2021, 10:01 AM

## 2021-11-16 NOTE — Progress Notes (Signed)
PROGRESS NOTE    Justin [name]  PTW:[id number] DOB: 1934/12/11 DOA: 11/15/2021 PCP: [facility]    Brief Narrative:   Justin [name] is a 87 y.o. male with medical history significant of dementia, CKD 3B, hyperlipidemia, diabetes, neuropathy, history of CVA, CAD, hypertension, chronic pancreatitis, anemia, diabetic foot wound presenting with worsening right leg and foot pain.  History obtained with assistance of family and chart review due to patient's reported dementia.  Patient has been at rehab facility following a stroke in February.  Does have some residual right-sided deficits.  His right foot has been in pain for the past couple weeks per her daughter and has had some swelling in that period.  This has increased significantly over the past couple days with increased swelling overnight.  Does have known history of diabetic foot wounds per daughter.  Assessment and Plan: Diabetic foot infection with ?Osteomyelitis > Patient presenting with redness warmth and swelling of the right lower extremity with wound at the base of the foot.  Imaging showing evidence of possible bone exposure, on exam warmth redness and swelling of right lower extremity especially foot, ulcerations on the toes. > ESR and CRP noted to be elevated in the ED in addition to the leukocytosis of 16.8. > Patient started on cefepime and Flagyl -MRI: Distal great toe ulceration with erosion of the distal phalangeal tuft and questionable minimal edema signal at the tip of the residual distal phalanx. There is largely preserved marrow signal. Findings could reflect very early osteomyelitis or reactive marrow change. No evidence of soft tissue abscess -podiatry following - MRSA screen +, continue vancomycin for now - Follow-up blood cultures - Pain control as needed   Intertrigo > Noted of his groin while in the ED. - Start nystatin powder 3 times daily   Hyperlipidemia CAD History of CVA - Continue  home atorvastatin and aspirin - Continue home isosorbide   Chronic pancreatitis - Continue home Creon  Diabetes type 2 with Diabetic neuropathy > 24 units daily and SSI at facility - long acting plus SSI - Continue home gabapentin   CKD 3B > Creatinine stable 1.88 in the ED - Trend renal function electrolytes   Dementia - delirium precautions   DVT prophylaxis: enoxaparin (LOVENOX) injection 30 mg Start: 11/16/21 1000    Code Status: DNR   Disposition Plan:  Level of care: Telemetry Surgical Status is: Observation The patient will require care spanning > 2 midnights and should be moved to inpatient     Consultants:  Podiatry    Subjective: In hallway, no complaints  Objective: Vitals:   11/16/21 0214 11/16/21 0643 11/16/21 1029 11/16/21 1031  BP: (!) 154/77 (!) 173/83 (!) 154/82 (!) 154/82  Pulse: 87 85 88 88  Resp: '16 16 16 16  ' Temp: 99.7 F (37.6 C)  99 F (37.2 C) 99 F (37.2 C)  TempSrc: Oral  Oral Oral  SpO2: 96% 96% 96% 96%    Intake/Output Summary (Last 24 hours) at 11/16/2021 1106 Last data filed at 11/15/2021 2103 Gross per 24 hour  Intake 98.45 ml  Output --  Net 98.45 ml   There were no vitals filed for this visit.  Examination:   General: Appearance:    elderly male in no acute distress     Lungs:      respirations unlabored  Heart:    Normal heart rate.     MS:   All extremities are intact.    Neurologic:   Awake,  alert       Data Reviewed: I have personally reviewed following labs and imaging studies  CBC: Recent Labs  Lab 11/15/21 1839 11/16/21 0352  WBC 16.8* 16.3*  NEUTROABS 12.5*  --   HGB 9.8* 9.9*  HCT 29.6* 30.0*  MCV 91.6 90.1  PLT 258 710   Basic Metabolic Panel: Recent Labs  Lab 11/15/21 1839 11/16/21 0352  NA 131* 132*  K 4.2 3.5  CL 100 104  CO2 23 21*  GLUCOSE 326* 156*  BUN 35* 30*  CREATININE 1.88* 1.83*  CALCIUM 8.5* 8.6*   GFR: CrCl cannot be calculated (Unknown ideal weight.). Liver  Function Tests: Recent Labs  Lab 11/15/21 1839 11/16/21 0352  AST 24 17  ALT 17 17  ALKPHOS 96 92  BILITOT 0.7 0.7  PROT 8.7* 8.7*  ALBUMIN 2.7* 2.5*   No results for input(s): "LIPASE", "AMYLASE" in the last 168 hours. No results for input(s): "AMMONIA" in the last 168 hours. Coagulation Profile: No results for input(s): "INR", "PROTIME" in the last 168 hours. Cardiac Enzymes: No results for input(s): "CKTOTAL", "CKMB", "CKMBINDEX", "TROPONINI" in the last 168 hours. BNP (last 3 results) No results for input(s): "PROBNP" in the last 8760 hours. HbA1C: No results for input(s): "HGBA1C" in the last 72 hours. CBG: Recent Labs  Lab 11/15/21 1735 11/15/21 2323 11/16/21 0759  GLUCAP 315* 305* 118*   Lipid Profile: No results for input(s): "CHOL", "HDL", "LDLCALC", "TRIG", "CHOLHDL", "LDLDIRECT" in the last 72 hours. Thyroid Function Tests: No results for input(s): "TSH", "T4TOTAL", "FREET4", "T3FREE", "THYROIDAB" in the last 72 hours. Anemia Panel: No results for input(s): "VITAMINB12", "FOLATE", "FERRITIN", "TIBC", "IRON", "RETICCTPCT" in the last 72 hours. Sepsis Labs: Recent Labs  Lab 11/15/21 1839  LATICACIDVEN 1.5    Recent Results (from the past 240 hour(s))  Blood culture (routine x 2)     Status: None (Preliminary result)   Collection Time: 11/15/21  6:52 PM   Specimen: BLOOD  Result Value Ref Range Status   Specimen Description BLOOD SITE NOT SPECIFIED  Final   Special Requests   Final    BOTTLES DRAWN AEROBIC ONLY Blood Culture adequate volume   Culture   Final    NO GROWTH < 12 HOURS Performed at [facility], [address], [place], [place] [address]    Report Status PENDING  Incomplete  Blood culture (routine x 2)     Status: None (Preliminary result)   Collection Time: 11/15/21  9:51 PM   Specimen: BLOOD RIGHT HAND  Result Value Ref Range Status   Specimen Description BLOOD RIGHT HAND  Final   Special Requests   Final    BOTTLES DRAWN  AEROBIC AND ANAEROBIC Blood Culture adequate volume   Culture   Final    NO GROWTH < 12 HOURS Performed at [facility], [place] [address], [place], [place] [address]    Report Status PENDING  Incomplete  MRSA Next Gen by PCR, Nasal     Status: Abnormal   Collection Time: 11/16/21  6:50 AM   Specimen: Nasal Mucosa; Nasal Swab  Result Value Ref Range Status   MRSA by PCR Next Gen DETECTED (A) NOT DETECTED Final    Comment: RESULT CALLED TO, READ BACK BY AND VERIFIED WITH:  11/16/21 8:43  RN CAMRYN COGAN (NOTE) The GeneXpert MRSA Assay (FDA approved for NASAL specimens only), is one component of a comprehensive MRSA colonization surveillance program. It is not intended to diagnose MRSA infection nor  to guide or monitor treatment for MRSA infections. Test performance is not FDA approved in patients less than 77 years old. Performed at [facility], [place] [address] [place], [place] [address]          Radiology Studies: VAS [place] ABI WITH/WO TBI  Result Date: 11/16/2021  LOWER EXTREMITY DOPPLER STUDY Patient Name:  Justin [name]  Date of Exam:   11/16/2021 Medical Rec #: [id number]         Accession #:    [id number] Date of Birth: 1934-06-04         Patient Gender: M Patient Age:   74 years Exam Location:  [facility] Procedure:      VAS [place] ABI WITH/WO TBI Referring Phys: ADAM MCDONALD --------------------------------------------------------------------------------  Indications: Ulceration. High Risk Factors: Hypertension, hyperlipidemia, Diabetes, past history of                    smoking, prior CVA.  Limitations: Today's exam was limited due to Movement. Comparison Study: No prior study on file Performing Technologist: Sharion Dove RVS  Examination Guidelines: A complete evaluation includes at minimum, Doppler waveform signals and systolic blood pressure reading at the level of bilateral brachial, anterior tibial, and posterior tibial arteries, when vessel segments are  accessible. Bilateral testing is considered an integral part of a complete examination. Photoelectric Plethysmograph (PPG) waveforms and toe systolic pressure readings are included as required and additional duplex testing as needed. Limited examinations for reoccurring indications may be performed as noted.  ABI Findings: +---------+------------------+-----+-----------+--------+ Right    Rt Pressure (mmHg)IndexWaveform   Comment  +---------+------------------+-----+-----------+--------+ Brachial 159                    triphasic           +---------+------------------+-----+-----------+--------+ PTA      102               0.64 multiphasic         +---------+------------------+-----+-----------+--------+ DP       120               0.75 multiphasic         +---------+------------------+-----+-----------+--------+ Great Toe54                0.34                     +---------+------------------+-----+-----------+--------+ +---------+------------------+-----+-----------+------------------+ Left     Lt Pressure (mmHg)IndexWaveform   Comment            +---------+------------------+-----+-----------+------------------+ Brachial 148                    triphasic                     +---------+------------------+-----+-----------+------------------+ PTA      136               0.86            Sounds multiphasic +---------+------------------+-----+-----------+------------------+ DP       130               0.82 multiphasic                   +---------+------------------+-----+-----------+------------------+ Great Toe162               1.02                               +---------+------------------+-----+-----------+------------------+ +-------+-----------+-----------+------------+------------+  ABI/TBIToday's ABIToday's TBIPrevious ABIPrevious TBI +-------+-----------+-----------+------------+------------+ Right  0.75       0.34                                 +-------+-----------+-----------+------------+------------+ Left   0.86       1.02                                +-------+-----------+-----------+------------+------------+  Summary: Right: Resting right ankle-brachial index indicates moderate right lower extremity arterial disease. The right toe-brachial index is abnormal. Left: Resting left ankle-brachial index indicates mild left lower extremity arterial disease. The left toe-brachial index is normal. *See table(s) above for measurements and observations.     Preliminary    MR FOOT RIGHT WO CONTRAST  Result Date: 11/16/2021 CLINICAL DATA:  Foot swelling, diabetic, osteomyelitis suspected, xray done diabetic foot infection EXAM: MRI OF THE RIGHT FOREFOOT WITHOUT CONTRAST TECHNIQUE: Multiplanar, multisequence MR imaging of the right forefoot was performed. No intravenous contrast was administered. COMPARISON:  Right foot radiograph 11/15/2021 FINDINGS: Motion degraded exam. Bones/Joint/Cartilage There is erosion of the great toe distal phalangeal tuft as seen radiographically. There is questionable minimal marrow edema at the residual tip of the distal phalanx but largely preserved marrow signal. No other significant marrow signal alteration in the forefoot. Ligaments Poorly evaluate due to motion artifact. Muscles and Tendons There is diffuse muscle edema muscle atrophy in the foot as is commonly seen in diabetics. No acute tendon tear in the forefoot. Soft tissues Diffuse soft tissue swelling of the foot. No defined fluid collection. IMPRESSION: Distal great toe ulceration with erosion of the distal phalangeal tuft and questionable minimal edema signal at the tip of the residual distal phalanx. There is largely preserved marrow signal. Findings could reflect very early osteomyelitis or reactive marrow change. No evidence of soft tissue abscess. Electronically Signed   By: Maurine Simmering M.D.   On: 11/16/2021 07:21   DG Foot Complete Right  Result  Date: 11/15/2021 CLINICAL DATA:  Right foot pain EXAM: RIGHT FOOT COMPLETE - 3+ VIEW COMPARISON:  None Available. FINDINGS: Osseous structures are diffusely osteopenic. Ulcer involving the a distal aspect of the right great toe noted with erosion of the terminal distal phalanx which is likely exposed at the base of the ulcer. No acute fracture or dislocation. Advanced vascular calcifications are seen within the right foot. IMPRESSION: Ulcer involving the distal aspect of the right great toe with erosion of the terminal distal phalanx, likely exposed at the base of the ulcer. Electronically Signed   By: Fidela Salisbury M.D.   On: 11/15/2021 19:46        Scheduled Meds:  atorvastatin  40 mg Oral Daily   enoxaparin (LOVENOX) injection  30 mg Subcutaneous Daily   fluticasone furoate-vilanterol  1 puff Inhalation Daily   gabapentin  100 mg Oral Daily   gabapentin  400 mg Oral QHS   insulin aspart  0-5 Units Subcutaneous QHS   insulin aspart  0-9 Units Subcutaneous TID WC   insulin glargine-yfgn  18 Units Subcutaneous Daily   lipase/protease/amylase  24,000 Units Oral TID AC   metroNIDAZOLE  500 mg Oral Q12H   nystatin   Topical TID   pantoprazole  40 mg Oral Daily   sodium chloride flush  3 mL Intravenous Q12H   testosterone cypionate  200 mg Intramuscular Q14 Days  Continuous Infusions:  ceFEPime (MAXIPIME) IV 2 g (11/16/21 1022)   [START ON 11/17/2021] vancomycin       LOS: 0 days    Time spent: 45 minutes spent on chart review, discussion with nursing staff, consultants, updating family and interview/physical exam; more than 50% of that time was spent in counseling and/or coordination of care.    Geradine Girt, DO [facility] Available via Epic secure chat 7am-7pm After these hours, please refer to coverage provider listed on [email or web address] 11/16/2021, 11:06 AM

## 2021-11-16 NOTE — ED Notes (Signed)
Double vitals due to vitals being delayed to cross over from the [facility].

## 2021-11-16 NOTE — Consult Note (Signed)
[facility] [place]  ASSESSMENT / PLAN: Justin [name] is a 87 y.o. male with atherosclerosis of native arteries of right lower extremity; diabetic foot infection and osteomyelitis.  patients with chronic limb threatening ischemia have an annual risk of cardiovascular mortality of 25% and a high risk of amputation.   WIfI score calculated based on clinical exam and non-invasive measurements. 1 / 1 / 2. Clinical stage 3.   Recommend the following which can slow the progression of atherosclerosis and reduce the risk of major adverse cardiac / limb events:  Complete cessation from all tobacco products. Blood glucose control with goal A1c < 7%. Blood pressure control with goal blood pressure < 140/90 mmHg. Lipid reduction therapy with goal LDL-C <100 mg/dL (<70 if symptomatic from PAD).  Aspirin 57m PO QD.  Atorvastatin 40-80mg PO QD (or other "high intensity" statin therapy).  Plan right lower extremity angiogram with possible intervention via left common femoral approach in cath lab Monday 11/20/21.   CHIEF COMPLAINT: right leg pain  HISTORY OF PRESENT ILLNESS: Justin DURHAMis a 87y.o. male admitted to the internal medicine service for treatment of right foot diabetic foot infection.  Patient is a very pleasantly confused elderly man.  Family is at bedside to supplement history.  He reports development of right second toe swelling about a week ago.  This has been painful for him.  He reports no antecedent claudication or ischemic rest pain symptoms.  He reports he is ambulatory, and able to walk around his house.  Past Medical History:  Diagnosis Date   Acute metabolic encephalopathy 24/98/2641  Arthritis    Diabetes mellitus    HTN (hypertension)    Hypercholesteremia    Iron deficiency    Pancreatitis, acute 06/02/2012   Diagnosed 05/2012    Personal history of colonic adenomas 04/15/2013   04/15/2013 2 diminutive polyps     Stroke ([facility]    TIA (transient  ischemic attack)     Past Surgical History:  Procedure Laterality Date   ARTERY BIOPSY  02/26/2011   Procedure: BIOPSY TEMPORAL ARTERY;  Surgeon: WWilley Blade MD;  [place]  Service: General;  Laterality: Left;  Left temporal artery biospy   [place]/ LIGATION  02/26/2011   left side   UPPER GASTROINTESTINAL ENDOSCOPY      Family History  Problem Relation Age of Onset   Heart disease Mother    Amblyopia Father    Diabetes Sister        x 2   Heart attack Sister     Social History   Socioeconomic History   Marital status: Married    Spouse name: Not on file   Number of children: 6   Years of education: Not on file   Highest education level: Not on file  Occupational History   Not on file  Tobacco Use   Smoking status: Former    Years: 35.00    Types: Cigarettes    Quit date: 04/09/1990    Years since quitting: 31.6   Smokeless tobacco: Never  Substance and Sexual Activity   Alcohol use: No    Comment: "per pt stopped drinking in the 90's"   Drug use: No   Sexual activity: Not on file  Other Topics Concern  Not on file  Social History Narrative   Not on file   Social Determinants of Health   Financial Resource Strain: Not on file  Food Insecurity: Not on file  Transportation Needs: Not on file  Physical Activity: Not on file  Stress: Not on file  Social Connections: Not on file  Intimate Partner Violence: Not on file    No Known Allergies  Current Facility-Administered Medications  Medication Dose Route Frequency Provider Last Rate Last Admin   acetaminophen (TYLENOL) tablet 650 mg  650 mg Oral Q6H PRN Marcelyn Bruins, MD       Or   acetaminophen (TYLENOL) suppository 650 mg  650 mg Rectal Q6H PRN Marcelyn Bruins, MD       atorvastatin (LIPITOR) tablet 40 mg  40 mg Oral Daily Marcelyn Bruins, MD   40 mg  at 11/16/21 1026   ceFEPIme (MAXIPIME) 2 g in sodium chloride 0.9 % 100 mL IVPB  2 g Intravenous Q12H Marcelyn Bruins, MD   Stopped at 11/16/21 1112   enoxaparin (LOVENOX) injection 30 mg  30 mg Subcutaneous Daily Marcelyn Bruins, MD   30 mg at 11/16/21 1022   fluticasone furoate-vilanterol (BREO ELLIPTA) 100-25 MCG/ACT 1 puff  1 puff Inhalation Daily Marcelyn Bruins, MD       gabapentin (NEURONTIN) capsule 100 mg  100 mg Oral Daily Marcelyn Bruins, MD   100 mg at 11/16/21 1026   gabapentin (NEURONTIN) capsule 400 mg  400 mg Oral QHS Marcelyn Bruins, MD   400 mg at 11/15/21 2338   insulin aspart (novoLOG) injection 0-5 Units  0-5 Units Subcutaneous QHS Marcelyn Bruins, MD   4 Units at 11/15/21 2338   insulin aspart (novoLOG) injection 0-9 Units  0-9 Units Subcutaneous TID WC Marcelyn Bruins, MD       insulin glargine-yfgn [facility]) injection 18 Units  18 Units Subcutaneous Daily Marcelyn Bruins, MD   18 Units at 11/16/21 1024   ipratropium-albuterol (DUONEB) 0.5-2.5 (3) MG/3ML nebulizer solution 3 mL  3 mL Inhalation Q6H PRN Marcelyn Bruins, MD       lipase/protease/amylase (CREON) capsule 24,000 Units  24,000 Units Oral TID Charna Archer Marcelyn Bruins, MD   24,000 Units at 11/16/21 1321   metroNIDAZOLE (FLAGYL) tablet 500 mg  500 mg Oral Q12H Marcelyn Bruins, MD   500 mg at 11/16/21 1026   nystatin (MYCOSTATIN/NYSTOP) topical powder   Topical TID Marcelyn Bruins, MD       pantoprazole (PROTONIX) EC tablet 40 mg  40 mg Oral Daily Marcelyn Bruins, MD   40 mg at 11/16/21 1026   polyethylene glycol (MIRALAX / GLYCOLAX) packet 17 g  17 g Oral Daily PRN Marcelyn Bruins, MD       sodium chloride flush (NS) 0.9 % injection 3 mL  3 mL Intravenous Q12H Marcelyn Bruins, MD   3 mL at 11/16/21 1029   testosterone cypionate (DEPOTESTOTERONE CYPIONATE) injection 200 mg  200 mg Intramuscular Q14 Days Georgiann Mccoy M, PA-C   200 mg at 04/05/13 0958   [START ON  11/17/2021] vancomycin (VANCOREADY) IVPB 1750 mg/350 mL  1,750 mg Intravenous Q48H Marcelyn Bruins, MD       Current Outpatient Medications  Medication Sig Dispense Refill   acetaminophen (TYLENOL) 325 MG tablet Take 650 mg by mouth daily.     Amino Acids-Protein Hydrolys (FEEDING SUPPLEMENT, PRO-STAT SUGAR FREE 64,) LIQD Take 30 mLs by mouth  2 (two) times daily.     aspirin 81 MG chewable tablet Chew 81 mg by mouth daily.     atorvastatin (LIPITOR) 40 MG tablet Take 40 mg by mouth at bedtime.     azelastine (ASTELIN) 0.1 % nasal spray Place 2 sprays into both nostrils 2 (two) times daily. Use in each nostril as directed     fluticasone (FLONASE) 50 MCG/ACT nasal spray Place 1 spray into both nostrils 2 (two) times daily.     fluticasone furoate-vilanterol (BREO ELLIPTA) 100-25 MCG/ACT AEPB Inhale 1 puff into the lungs daily.     gabapentin (NEURONTIN) 300 MG capsule Take 300 mg by mouth daily.     gabapentin (NEURONTIN) 400 MG capsule Take 400 mg by mouth at bedtime.     glucagon (GLUCAGON EMERGENCY) 1 MG injection Inject 1 mg into the muscle once as needed. (Patient taking differently: Inject 1 mg into the muscle once as needed (low blood sugar).) 1 each 12   Glucerna (GLUCERNA) LIQD Take 237 mLs by mouth 2 (two) times daily.     insulin glargine (LANTUS) 100 UNIT/ML injection Inject 24 Units into the skin in the morning.     isosorbide mononitrate (ISMO) 10 MG tablet Take 10 mg by mouth at bedtime.     lidocaine (LMX) 4 % cream Apply 1 application  topically daily. Apply generously to right lower leg and heels daily-pain.     lipase/protease/amylase (CREON) 12000-38000 units CPEP capsule Take 24,000 Units by mouth 3 (three) times daily before meals.     loratadine (CLARITIN) 10 MG tablet Take 10 mg by mouth daily.     mineral oil-hydrophilic petrolatum (AQUAPHOR) ointment Apply 1 Application topically See admin instructions. 1 application topical once a day. Mix with lidocaine cream-to  feet/toes daily-dry skin     Multiple Vitamin (MULTI-VITAMIN DAILY) TABS Take 1 tablet by mouth daily.     NOVOLOG FLEXPEN 100 UNIT/ML FlexPen Inject 3 mLs into the skin See admin instructions. Per sliding scale: If blood sugar is 70 to 100, give 0 units. If blood sugar is 201 to 250, give 2 units. If blood sugar is 251 to 300, give 4 units. If blood sugar is 301 to 350, give 6 units. If blood sugar is 351 to 400, give 8 units. If blood sugar is greater than 400, cal MD.     omeprazole (PRILOSEC) 20 MG capsule Take 20 mg by mouth every other day.     Skin Protectants, Misc. (MINERIN CREME EX) Apply 1 application  topically daily. Apply minerin cream to bilateral feet daily. R/t dry/cracked skin     TRULICITY 1.5 YS/0.6TK SOPN Inject 1.5 mg into the skin once a week. Fridays     Alcohol Swabs PADS Use to check blood sugar 3 times daily. Dx: E10.9 300 each 3   atorvastatin (LIPITOR) 80 MG tablet Take 1 tablet (80 mg total) by mouth daily. (Patient not taking: Reported on 11/16/2021) 30 tablet 0   Blood Glucose Calibration (ACCU-CHEK AVIVA) SOLN Use to check blood sugar 3 times daily. Dx: E10.9 1 each 3   Blood Glucose Monitoring Suppl (ACCU-CHEK AVIVA PLUS) w/Device KIT Use to check blood sugar 3 times daily. Dx: E10.9 1 kit 0   glucose blood (ACCU-CHEK AVIVA PLUS) test strip Use to check blood sugar 3 times daily. Dx: E10.9 300 each 3   Insulin Pen Needle 32G X 4 MM MISC Use as directed to inject insulin tidwc 100 each 11   Ipratropium-Albuterol (COMBIVENT) 20-100  MCG/ACT AERS respimat Inhale 1 puff into the lungs every 6 (six) hours as needed for wheezing. (Patient not taking: Reported on 11/16/2021)     Lancets (ACCU-CHEK SOFT TOUCH) lancets Use to check blood sugar 3 times daily. Dx: E10.9 300 each 3   Spacer/Aero-Holding Dorise Bullion Use as directed with combivent. Ok to dispense any spacer that works with the inhaler being rxed to pt. 1 each 1    PHYSICAL EXAM Vitals:   11/16/21 0643 11/16/21  1029 11/16/21 1031 11/16/21 1606  BP: (!) 173/83 (!) 154/82 (!) 154/82 (!) 147/78  Pulse: 85 88 88 94  Resp: _0 Temp:  99 F (37.2 C) 99 F (37.2 C) 99 F (37.2 C)  TempSrc:  Oral Oral   SpO2: 96% 96% 96% 96%   Thin elderly man in no acute distress Regular rate and rhythm Unlabored breathing Soft, nontender abdomen 2+ femoral pulses bilaterally 2+ popliteal pulses bilaterally No palpable pedal pulses bilaterally Right second toe with ischemic discoloration and superficial ulceration  PERTINENT LABORATORY AND RADIOLOGIC DATA  Most recent CBC    Latest Ref Rng & Units 11/16/2021    3:52 AM 11/15/2021    6:39 PM 05/24/2021    4:23 AM  CBC  WBC 4.0 - 10.5 K/uL 16.3  16.8  10.2   Hemoglobin 13.0 - 17.0 g/dL 9.9  9.8  9.4   Hematocrit 39.0 - 52.0 % 30.0  29.6  28.3   Platelets 150 - 400 K/uL 257  258  294      Most recent CMP    Latest Ref Rng & Units 11/16/2021    3:52 AM 11/15/2021    6:39 PM 05/24/2021    8:29 AM  CMP  Glucose 70 - 99 mg/dL 156  326  98   BUN 8 - 23 mg/dL 30  35  20   Creatinine 0.61 - 1.24 mg/dL 1.83  1.88  1.62   Sodium 135 - 145 mmol/L 132  131  136   Potassium 3.5 - 5.1 mmol/L 3.5  4.2  4.2   Chloride 98 - 111 mmol/L 104  100  103   CO2 22 - 32 mmol/L _1 Calcium 8.9 - 10.3 mg/dL 8.6  8.5  8.8   Total Protein 6.5 - 8.1 g/dL 8.7  8.7    Total Bilirubin 0.3 - 1.2 mg/dL 0.7  0.7    Alkaline Phos 38 - 126 U/L 92  96    AST 15 - 41 U/L 17  24    ALT 0 - 44 U/L 17  17      Renal function CrCl cannot be calculated (Unknown ideal weight.).  Hgb A1c MFr Bld (%)  Date Value  05/22/2021 7.6 (H)    LDL Cholesterol  Date Value Ref Range Status  05/22/2021 85 0 - 99 mg/dL Final    Comment:           Total Cholesterol/HDL:CHD Risk Coronary Heart Disease Risk Table                     Men   Women  1/2 Average Risk   3.4   3.3  Average Risk       5.0   4.4  2 X Average Risk   9.6   7.1  3 X Average Risk  23.4   11.0        Use  the calculated Patient Ratio  above and the CHD Risk Table to determine the patient's CHD Risk.        ATP III CLASSIFICATION (LDL):  <100     mg/dL   Optimal  100-129  mg/dL   Near or Above                    Optimal  130-159  mg/dL   Borderline  160-189  mg/dL   High  >190     mg/dL   Very High Performed at [facility] [address] [place], [place] [address]      +-------+-----------+-----------+------------+------------+  ABI/TBIToday's ABIToday's TBIPrevious ABIPrevious TBI  +-------+-----------+-----------+------------+------------+  Right  0.75       0.34                                 +-------+-----------+-----------+------------+------------+  Left   0.86       1.02                                 +-------+-----------+-----------+------------+------------+   [name], MD [facility] Phone Number: [phone] 11/16/2021 6:14 PM  Total time spent on preparing this encounter including chart review, data review, collecting history, examining the patient, coordinating care for this new patient, 60 minutes.  Portions of this report may have been transcribed using voice recognition software.  Every effort has been made to ensure accuracy; however, inadvertent computerized transcription errors may still be present.

## 2021-11-16 NOTE — ED Notes (Signed)
Pt cleaned by this tech and nurse. Pt clothing removed and provided clean gown, fresh linen, and bed pad. Pt also placed on condom cathter and provided warm blankets.

## 2021-11-16 NOTE — Consult Note (Signed)
Reason for Consult:right foot infection Referring Physician: Dr Molli Hazard Justin [name] is an 87 y.o. male.  HPI: He has a history of ulcerations on the right feet as well as type 2 diabetes.  He has been seeing a doctor [facility].  He was admitted after having altered mental status and the suspicion was that this was from infection from his foot  Past Medical History:  Diagnosis Date   Acute metabolic encephalopathy 9/76/7341   Arthritis    Diabetes mellitus    HTN (hypertension)    Hypercholesteremia    Iron deficiency    Pancreatitis, acute 06/02/2012   Diagnosed 05/2012    Personal history of colonic adenomas 04/15/2013   04/15/2013 2 diminutive polyps     Stroke [facility])    TIA (transient ischemic attack)     Past Surgical History:  Procedure Laterality Date   ARTERY BIOPSY  02/26/2011   Procedure: BIOPSY TEMPORAL ARTERY;  Surgeon: Willey Blade, MD;  Location: [place];  Service: General;  Laterality: Left;  Left temporal artery biospy   [place] / LIGATION  02/26/2011   left side   UPPER GASTROINTESTINAL ENDOSCOPY      Family History  Problem Relation Age of Onset   Heart disease Mother    Amblyopia Father    Diabetes Sister        x 2   Heart attack Sister     Social History:  reports that he quit smoking about 31 years ago. His smoking use included cigarettes. He has never used smokeless tobacco. He reports that he does not drink alcohol and does not use drugs.  Allergies: No Known Allergies  Medications: I have reviewed the patient's current medications.  Results for orders placed or performed during the hospital encounter of 11/15/21 (from the past 48 hour(s))  CBG monitoring, ED     Status: Abnormal   Collection Time: 11/15/21  5:35 PM  Result Value Ref Range   Glucose-Capillary 315 (H) 70 - 99 mg/dL     Comment: Glucose reference range applies only to samples taken after fasting for at least 8 hours.  CBC with Differential     Status: Abnormal   Collection Time: 11/15/21  6:39 PM  Result Value Ref Range   WBC 16.8 (H) 4.0 - 10.5 K/uL   RBC 3.23 (L) 4.22 - 5.81 MIL/uL   Hemoglobin 9.8 (L) 13.0 - 17.0 g/dL   HCT 29.6 (L) 39.0 - 52.0 %   MCV 91.6 80.0 - 100.0 fL   MCH 30.3 26.0 - 34.0 pg   MCHC 33.1 30.0 - 36.0 g/dL   RDW 13.9 11.5 - 15.5 %   Platelets 258 150 - 400 K/uL   nRBC 0.0 0.0 - 0.2 %   Neutrophils Relative % 74 %   Neutro Abs 12.5 (H) 1.7 - 7.7 K/uL   Lymphocytes Relative 13 %   Lymphs Abs 2.2 0.7 - 4.0 K/uL   Monocytes Relative 8 %   Monocytes Absolute 1.4 (H) 0.1 - 1.0 K/uL   Eosinophils Relative 3 %   Eosinophils Absolute 0.4 0.0 - 0.5 K/uL   Basophils Relative 1 %   Basophils Absolute 0.1 0.0 - 0.1 K/uL   Immature Granulocytes 1 %   Abs Immature Granulocytes 0.12 (H) 0.00 -  0.07 K/uL    Comment: Performed at [facility], [place] [address] [place], [place] [address]  Comprehensive metabolic panel     Status: Abnormal   Collection Time: 11/15/21  6:39 PM  Result Value Ref Range   Sodium 131 (L) 135 - 145 mmol/L   Potassium 4.2 3.5 - 5.1 mmol/L   Chloride 100 98 - 111 mmol/L   CO2 23 22 - 32 mmol/L   Glucose, Bld 326 (H) 70 - 99 mg/dL    Comment: Glucose reference range applies only to samples taken after fasting for at least 8 hours.   BUN 35 (H) 8 - 23 mg/dL   Creatinine, Ser 1.88 (H) 0.61 - 1.24 mg/dL   Calcium 8.5 (L) 8.9 - 10.3 mg/dL   Total Protein 8.7 (H) 6.5 - 8.1 g/dL   Albumin 2.7 (L) 3.5 - 5.0 g/dL   AST 24 15 - 41 U/L   ALT 17 0 - 44 U/L   Alkaline Phosphatase 96 38 - 126 U/L   Total Bilirubin 0.7 0.3 - 1.2 mg/dL   GFR, Estimated 35 (L) >60 mL/min    Comment: (NOTE) Calculated using the CKD-EPI Creatinine Equation (2021)    Anion gap 8 5 - 15    Comment: Performed at [facility], [place] [address] [place], [place] [address]  Lactic  acid, plasma     Status: None   Collection Time: 11/15/21  6:39 PM  Result Value Ref Range   Lactic Acid, Venous 1.5 0.5 - 1.9 mmol/L    Comment: Performed at [facility] [address] [place], [place] [address]  Sedimentation rate     Status: Abnormal   Collection Time: 11/15/21  6:39 PM  Result Value Ref Range   Sed Rate 118 (H) 0 - 16 mm/hr    Comment: Performed at [facility] [address] [place], [place] [address]  C-reactive protein     Status: Abnormal   Collection Time: 11/15/21  6:39 PM  Result Value Ref Range   CRP 10.3 (H) <1.0 mg/dL    Comment: Performed at [facility], [place] [address] [place], [place] [address]  Blood culture (routine x 2)     Status: None (Preliminary result)   Collection Time: 11/15/21  6:52 PM   Specimen: BLOOD  Result Value Ref Range   Specimen Description BLOOD SITE NOT SPECIFIED    Special Requests      BOTTLES DRAWN AEROBIC ONLY Blood Culture adequate volume   Culture      NO GROWTH < 12 HOURS Performed at [facility], [place] [address], [place], [place] [address]    Report Status PENDING   Blood culture (routine x 2)     Status: None (Preliminary result)   Collection Time: 11/15/21  9:51 PM   Specimen: BLOOD RIGHT HAND  Result Value Ref Range   Specimen Description BLOOD RIGHT HAND    Special Requests      BOTTLES DRAWN AEROBIC AND ANAEROBIC Blood Culture adequate volume   Culture      NO GROWTH < 12 HOURS Performed at [facility], [place] [address], [place], [place] [address]    Report Status PENDING   CBG monitoring, ED     Status: Abnormal   Collection Time: 11/15/21 11:23 PM  Result Value Ref Range   Glucose-Capillary 305 (H) 70 - 99 mg/dL    Comment: Glucose reference range applies only to  samples taken after fasting for at least 8 hours.  Comprehensive metabolic panel     Status: Abnormal   Collection Time: 11/16/21  3:52 AM  Result Value Ref Range   Sodium 132 (L) 135 - 145 mmol/L    Potassium 3.5 3.5 - 5.1 mmol/L   Chloride 104 98 - 111 mmol/L   CO2 21 (L) 22 - 32 mmol/L   Glucose, Bld 156 (H) 70 - 99 mg/dL    Comment: Glucose reference range applies only to samples taken after fasting for at least 8 hours.   BUN 30 (H) 8 - 23 mg/dL   Creatinine, Ser 1.83 (H) 0.61 - 1.24 mg/dL   Calcium 8.6 (L) 8.9 - 10.3 mg/dL   Total Protein 8.7 (H) 6.5 - 8.1 g/dL   Albumin 2.5 (L) 3.5 - 5.0 g/dL   AST 17 15 - 41 U/L   ALT 17 0 - 44 U/L   Alkaline Phosphatase 92 38 - 126 U/L   Total Bilirubin 0.7 0.3 - 1.2 mg/dL   GFR, Estimated 36 (L) >60 mL/min    Comment: (NOTE) Calculated using the CKD-EPI Creatinine Equation (2021)    Anion gap 7 5 - 15    Comment: Performed at [facility], [place] [address], [place], [place] [address]  CBC     Status: Abnormal   Collection Time: 11/16/21  3:52 AM  Result Value Ref Range   WBC 16.3 (H) 4.0 - 10.5 K/uL   RBC 3.33 (L) 4.22 - 5.81 MIL/uL   Hemoglobin 9.9 (L) 13.0 - 17.0 g/dL   HCT 30.0 (L) 39.0 - 52.0 %   MCV 90.1 80.0 - 100.0 fL   MCH 29.7 26.0 - 34.0 pg   MCHC 33.0 30.0 - 36.0 g/dL   RDW 13.8 11.5 - 15.5 %   Platelets 257 150 - 400 K/uL   nRBC 0.0 0.0 - 0.2 %    Comment: Performed at [facility], [place] [address] [place], [place] [address]    MR FOOT RIGHT WO CONTRAST  Result Date: 11/16/2021 CLINICAL DATA:  Foot swelling, diabetic, osteomyelitis suspected, xray done diabetic foot infection EXAM: MRI OF THE RIGHT FOREFOOT WITHOUT CONTRAST TECHNIQUE: Multiplanar, multisequence MR imaging of the right forefoot was performed. No intravenous contrast was administered. COMPARISON:  Right foot radiograph 11/15/2021 FINDINGS: Motion degraded exam. Bones/Joint/Cartilage There is erosion of the great toe distal phalangeal tuft as seen radiographically. There is questionable minimal marrow edema at the residual tip of the distal phalanx but largely preserved marrow signal. No other significant marrow signal alteration in the forefoot.  Ligaments Poorly evaluate due to motion artifact. Muscles and Tendons There is diffuse muscle edema muscle atrophy in the foot as is commonly seen in diabetics. No acute tendon tear in the forefoot. Soft tissues Diffuse soft tissue swelling of the foot. No defined fluid collection. IMPRESSION: Distal great toe ulceration with erosion of the distal phalangeal tuft and questionable minimal edema signal at the tip of the residual distal phalanx. There is largely preserved marrow signal. Findings could reflect very early osteomyelitis or reactive marrow change. No evidence of soft tissue abscess. Electronically Signed   By: Maurine Simmering M.D.   On: 11/16/2021 07:21   DG Foot Complete Right  Result Date: 11/15/2021 CLINICAL DATA:  Right foot pain EXAM: RIGHT FOOT COMPLETE - 3+ VIEW COMPARISON:  None Available. FINDINGS: Osseous structures are diffusely osteopenic. Ulcer involving the a distal aspect of the right great toe noted with  erosion of the terminal distal phalanx which is likely exposed at the base of the ulcer. No acute fracture or dislocation. Advanced vascular calcifications are seen within the right foot. IMPRESSION: Ulcer involving the distal aspect of the right great toe with erosion of the terminal distal phalanx, likely exposed at the base of the ulcer. Electronically Signed   By: Fidela Salisbury M.D.   On: 11/15/2021 19:46    Review of Systems  Musculoskeletal:        Foot pain  Skin:        Ulcer right foot  All other systems reviewed and are negative.  Blood pressure (!) 173/83, pulse 85, temperature 99.7 F (37.6 C), temperature source Oral, resp. rate 16, SpO2 96 %.  Vitals:   11/16/21 0214 11/16/21 0643  BP: (!) 154/77 (!) 173/83  Pulse: 87 85  Resp: 16 16  Temp: 99.7 F (37.6 C)   SpO2: 96% 96%    General AA&O x3. Normal mood and affect.  Vascular Dorsalis pedis and posterior tibial pulses  barely palpable right  Capillary refill normal to all digits. Pedal hair growth  diminished.  Neurologic Epicritic sensation grossly present.  Dermatologic (Wound) Bloody partial-thickness ulceration right second PIPJ, there is no ulceration at the tip of the hallux  Orthopedic: Motor intact BLE.        Assessment/Plan:  Diabetic foot infection, right foot osteomyelitis -Imaging: Studies independently reviewed.  The diagnosis of osteomyelitis of his great toe is somewhat puzzling.  His daughter was not sure if this was the toe that previously had the ulceration, she will look at photos from her niece from his other podiatrist.  I will try to get records from this office as well.  I am not inclined to amputate the big toe once his wound is now healed, however bone biopsy and culture may be indicated.  More concerning to me is the right second toe which appears to have a ulceration, will discuss with radiology if there is evidence of osteomyelitis here, they did not comment on this -Antibiotics: Will keep him on broad-spectrum antibiotics for diabetic foot infection for now -WB Status: WBAT -Surgical Plan: Pending the above -ABIs ordered  Criselda Peaches 11/16/2021, 7:57 AM   Best available via secure chat for questions or concerns.

## 2021-11-16 NOTE — ED Notes (Signed)
Pt transported to vascular.  °

## 2021-11-17 ENCOUNTER — Inpatient Hospital Stay (HOSPITAL_COMMUNITY): Payer: Medicare Other

## 2021-11-17 DIAGNOSIS — L089 Local infection of the skin and subcutaneous tissue, unspecified: Secondary | ICD-10-CM | POA: Diagnosis not present

## 2021-11-17 DIAGNOSIS — E11628 Type 2 diabetes mellitus with other skin complications: Secondary | ICD-10-CM | POA: Diagnosis not present

## 2021-11-17 LAB — GLUCOSE, CAPILLARY
Glucose-Capillary: 41 mg/dL — CL (ref 70–99)
Glucose-Capillary: 82 mg/dL (ref 70–99)
Glucose-Capillary: 83 mg/dL (ref 70–99)
Glucose-Capillary: 153 mg/dL — ABNORMAL HIGH (ref 70–99)
Glucose-Capillary: 117 mg/dL — ABNORMAL HIGH (ref 70–99)

## 2021-11-17 LAB — CBC
HCT: 30.4 % — ABNORMAL LOW (ref 39.0–52.0)
Hemoglobin: 10 g/dL — ABNORMAL LOW (ref 13.0–17.0)
MCH: 29.6 pg (ref 26.0–34.0)
MCHC: 32.9 g/dL (ref 30.0–36.0)
MCV: 89.9 fL (ref 80.0–100.0)
Platelets: 271 K/uL (ref 150–400)
RBC: 3.38 MIL/uL — ABNORMAL LOW (ref 4.22–5.81)
RDW: 13.9 % (ref 11.5–15.5)
WBC: 17.8 K/uL — ABNORMAL HIGH (ref 4.0–10.5)
nRBC: 0 % (ref 0.0–0.2)

## 2021-11-17 LAB — BASIC METABOLIC PANEL WITH GFR
Anion gap: 5 (ref 5–15)
BUN: 29 mg/dL — ABNORMAL HIGH (ref 8–23)
CO2: 21 mmol/L — ABNORMAL LOW (ref 22–32)
Calcium: 8.6 mg/dL — ABNORMAL LOW (ref 8.9–10.3)
Chloride: 107 mmol/L (ref 98–111)
Creatinine, Ser: 1.87 mg/dL — ABNORMAL HIGH (ref 0.61–1.24)
GFR, Estimated: 35 mL/min — ABNORMAL LOW (ref >60)
Glucose, Bld: 73 mg/dL (ref 70–99)
Potassium: 3.7 mmol/L (ref 3.5–5.1)
Sodium: 133 mmol/L — ABNORMAL LOW (ref 135–145)

## 2021-11-17 MED ORDER — INSULIN GLARGINE-YFGN 100 UNIT/ML ~~LOC~~ SOLN
10.0000 [IU] | Freq: Every day | SUBCUTANEOUS | Status: DC
  Administered 2021-11-18: 10 [IU] via SUBCUTANEOUS
  Filled 2021-11-17 (×2): qty 0.1

## 2021-11-17 MED ORDER — AZITHROMYCIN 250 MG PO TABS
500.0000 mg | ORAL_TABLET | Freq: Every day | ORAL | Status: DC
  Administered 2021-11-17 – 2021-11-18 (×2): 500 mg via ORAL
  Filled 2021-11-17 (×2): qty 2

## 2021-11-17 NOTE — Progress Notes (Signed)
  Subjective:  Patient ID: Justin [name], male    DOB: 1934-09-26,  MRN: [id number]  Seen at bedside resting comfortably. Having some pain in feet   Negative for chest pain and shortness of breath Fever: no Night sweats: no Constitutional signs: no Objective:   Vitals:   11/17/21 1543 11/17/21 2125  BP: 132/70 (!) 147/89  Pulse: 93 85  Resp: 17 18  Temp: 98 F (36.7 C) 98.3 F (36.8 C)  SpO2: 99% 98%   General AA&O x3. Normal mood and affect.  Vascular Non palp pulses. Foot overall warm  Neurologic Epicritic sensation grossly intact.  Dermatologic Ulceration dorsal PIPJ R 2nd toe. No drainage. Dark discoloration but no purulence or cellulitis  Orthopedic: MMT 5/5 in dorsiflexion, plantarflexion, inversion, and eversion. Normal joint ROM without pain or crepitus.    Assessment & Plan:  Patient was evaluated and treated and all questions answered.  Right third toe ulcer, PAD -Case discussed with Dr Stanford Breed from [facility]. Plan for angio Monday -Continue abx -Will continue to follow. Unclear that he will need amputation. Hopefully can heal with revascularization and wound care  Criselda Peaches, DPM  Accessible via secure chat for questions or concerns.

## 2021-11-17 NOTE — Consult Note (Signed)
[place] Nurse Consult Note: Patient receiving care in [place] Reason for Consult: right heel wound Wound type: Stage 2 PI right heel Claw toes with Onychauxis of the toenails. Dry skin and flaking in between the toes. Small callus on the left heel and right lateral side of the foot. Both feet are painful, especially the right foot.  Pressure Injury POA: Yes Drainage (amount, consistency, odor)  Dressing procedure/placement/frequency: Clean both feet with soap and water including in between the toes, rinse and pat dry. Apply thin long piece of Xeroform gauze weaving in between the toes and apply a cut to fit piece of Xeroform gauze over the right and left heels and over the right lateral side of the foot. Wrap the toes with a small Kerlix to hold Xeroform in place then wrap both feet from the toes up to over the ankles with a Kerlix. Change this dressing daily. Place both feet in Prevalon boots.   Monitor the wound area(s) for worsening of condition such as: Signs/symptoms of infection, increase in size, development of or worsening of odor, development of pain, or increased pain at the affected locations.   Notify the medical team if any of these develop.  Thank you for the consult. [place] nurse will not follow at this time.   Please re-consult the [place] team if needed.  Cathlean Marseilles Tamala Julian, MSN, RN, [place], [place], [facility] Wound Treatment Associate Pager [phone]

## 2021-11-17 NOTE — Inpatient Diabetes Management (Signed)
Inpatient Diabetes Program Recommendations  AACE/ADA: New Consensus Statement on Inpatient Glycemic Control (2015)  Target Ranges:  Prepandial:   less than 140 mg/dL      Peak postprandial:   less than 180 mg/dL (1-2 hours)      Critically ill patients:  140 - 180 mg/dL   Lab Results  Component Value Date   GLUCAP 83 11/17/2021   HGBA1C 7.6 (H) 05/22/2021    Review of Glycemic Control  Latest Reference Range & Units 11/17/21 07:30 11/17/21 08:01 11/17/21 11:33  Glucose-Capillary 70 - 99 mg/dL 41 (LL) 82 83  (LL): Data is critically low  Diabetes history: DM2 Outpatient Diabetes medications: Lantus 38 units QD, Humalog 12 units QID, Trulicity 1.5 mg weekly Current orders for Inpatient glycemic control: Semglee 18 units QAM, Novolog 0-9 units TID and 0-5 units QHS  Inpatient Diabetes Program Recommendations:    Please decrease basal insulin to:  Semglee 10 units QAM (has already received 18 units today.  Watch closely for hypoglycemia).    Will continue to follow while inpatient.  Thank you, Reche Dixon, MSN, [place] Diabetes Coordinator Inpatient Diabetes Program [phone] (team pager from 8a-5p)

## 2021-11-17 NOTE — TOC Initial Note (Signed)
Transition of Care [facility]) - Initial/Assessment Note    Patient Details  Name: Justin [name] MRN: [id number] Date of Birth: 03-20-1935  Transition of Care [facility]) CM/SW Contact:    Amador Cunas, [place] Phone Number: 11/17/2021, 11:06 AM  Clinical Narrative: Damaris Schooner to Lorenza Chick with [facility] admissions who confirmed pt is a LTC resident and is able to return at dc. SW will follow.   Wandra Feinstein, MSW, LCSW [phone] (coverage)                    Expected Discharge Plan: [place] Barriers to Discharge: Continued Medical Work up   Patient Goals and CMS Choice        Expected Discharge Plan and Services Expected Discharge Plan: [place]                                              Prior Living Arrangements/Services                Care giver support system in place?: Yes (comment)   Criminal Activity/Legal Involvement Pertinent to Current Situation/Hospitalization: No - Comment as needed  Activities of Daily Living Home Assistive Devices/Equipment: None ADL Screening (condition at time of admission) Patient's cognitive ability adequate to safely complete daily activities?: No Is the patient deaf or have difficulty hearing?: Yes Does the patient have difficulty seeing, even when wearing glasses/contacts?: No Does the patient have difficulty concentrating, remembering, or making decisions?: Yes Patient able to express need for assistance with ADLs?: Yes Does the patient have difficulty dressing or bathing?: Yes Independently performs ADLs?: No Communication: Independent Dressing (OT): Needs assistance Is this a change from baseline?: Pre-admission baseline Grooming: Independent Feeding: Independent Bathing: Needs assistance Is this a change from baseline?: Pre-admission baseline Toileting: Independent In/Out Bed: Needs assistance Is this a change from baseline?: Pre-admission baseline Walks in Home:  Independent Does the patient have difficulty walking or climbing stairs?: Yes Weakness of Legs: Both Weakness of Arms/Hands: None  Permission Sought/Granted Permission sought to share information with : Customer service manager                Emotional Assessment       Orientation: : Fluctuating Orientation (Suspected and/or reported [place])      Admission diagnosis:  Diabetic foot ([place]) [E11.8] Diabetic foot infection ([place]) [K93.267, L08.9] Patient Active Problem List   Diagnosis Date Noted   Diabetic foot ([place]) 11/16/2021   Diabetic foot infection ([place]) 11/15/2021   Intertrigo 11/15/2021   History of CVA (cerebrovascular accident) 05/21/2021   Chronic kidney disease, stage 3b ([place]) 05/21/2021   Mixed diabetic hyperlipidemia associated with type 2 diabetes mellitus ([place]) 05/21/2021   Polyneuropathy due to type 2 diabetes mellitus ([place]) 05/21/2021   Leukocytosis 05/21/2021   COVID-19 virus infection 05/21/2021   Type 2 diabetes mellitus with stage 3b chronic kidney disease, with long-term current use of insulin ([place]) 09/29/2015   Arthritis 09/06/2015   Diabetic peripheral neuropathy ([place]) 08/17/2015   OA (osteoarthritis) of knee 08/17/2015   Primary osteoarthritis of both knees 08/11/2015   Primary osteoarthritis of left hip 08/11/2015   Hammer toe, acquired 09/30/2013   Personal history of colonic adenomas 04/15/2013   Chronic pancreatitis ([place]) 02/26/2013   Anemia of chronic disease 02/26/2013   Goals of care, counseling/discussion 02/26/2013   Essential hypertension 12/14/2012   Testosterone  deficiency 10/22/2011   Headache above the eye region 02/22/2011   Coronary artery disease 02/22/2011   PCP:  [facility] [place]:   [place], [place] [address] Phone: [phone] Fax: [phone]  [place], [place] [address] Phone: [phone] Fax: [phone]  [place], [place] [address] Phone: [phone] Fax: [phone]     Social Determinants of Health (SDOH) Interventions    Readmission Risk Interventions     No data to display

## 2021-11-17 NOTE — Progress Notes (Signed)
Modified Barium Swallow Progress Note  Patient Details  Name: Justin [name] MRN: [id number] Date of Birth: 03-Jun-1934  Today's Date: 11/17/2021  Modified Barium Swallow completed.  Full report located under Chart Review in the Imaging Section.  Brief recommendations include the following:  Clinical Impression  Pt exhibited mild oral holding and lingual pumping with puree and liquids, but his oropharyngeal swallow mechainsm was otherwise [facility] with adequate movement and strength of oral and pharyngeal structures. No pharyngeal residue was noted throughout the study and pharyngeal stripping was WNL. Esophageal sweep revealed stasis of barium in the upper thoracic esophagus which was cleared with additional boluses of thin liquids. Considering esophageal stasis and pt's diagnosis of GERD, post-prandial aspiration is questioned. The etiology of esophageal stasis cannot be determined with this study; consider esophageal assessment (e.g., esophagram). Pt's current diet will be continued and SLP will follow briefly for education, but further SLP services will likely not be clinically indicated beyond that point.   Swallow Evaluation Recommendations   Recommended Consults: Consider esophageal assessment   SLP Diet Recommendations: Regular solids;Thin liquid   Liquid Administration via: Cup;Straw   Medication Administration: Whole meds with liquid   Supervision: Intermittent supervision to cue for compensatory strategies   Compensations: Slow rate;Follow solids with liquid;Minimize environmental distractions   Postural Changes: Seated upright at 90 degrees   Oral Care Recommendations: Oral care BID      Justin [name] I. Hardin Negus, [place], [place] Office number [phone]   [name] 11/17/2021,3:09 PM

## 2021-11-17 NOTE — Progress Notes (Signed)
PROGRESS NOTE    Justin [name]  CWU:[id number] DOB: [birth year] DOA: 11/15/2021 [facility]CP: [facility]    Brief Narrative:   BRONISLAUS [name] is a 87 y.o. male with medical history significant of dementia, CKD 3B, hyperlipidemia, diabetes, neuropathy, history of CVA, CAD, hypertension, chronic pancreatitis, anemia, diabetic foot wound presenting with worsening right leg and foot pain.  History obtained with assistance of family and chart review due to patient's reported dementia.  Patient has been at rehab facility following a stroke in February.  Does have some residual right-sided deficits.  His right foot has been in pain for the past couple weeks per her daughter and has had some swelling in that period.  This has increased significantly over the past couple days with increased swelling overnight.  Does have known history of diabetic foot wounds per daughter.  Assessment and Plan: Suspected pna -added azithromycin to current abx and d/c'd flagyl -PRN O2 -pulmonary toilet  Diabetic foot infection with ?Osteomyelitis > Patient presenting with redness warmth and swelling of the right lower extremity with wound at the base of the foot.  Imaging showing evidence of possible bone exposure, on exam warmth redness and swelling of right lower extremity especially foot, ulcerations on the toes. > ESR and CRP noted to be elevated in the ED in addition to the leukocytosis of 16.8. > Patient started on cefepime and Flagyl -MRI: Distal great toe ulceration with erosion of the distal phalangeal tuft and questionable minimal edema signal at the tip of the residual distal phalanx. There is largely preserved marrow signal. Findings could reflect very early osteomyelitis or reactive marrow change. No evidence of soft tissue abscess -podiatry following - MRSA screen +, continue vancomycin for now - Follow-up blood cultures - Pain control as needed -WOC   Intertrigo > Noted of his groin  while in the ED. - Start nystatin powder 3 times daily   Hyperlipidemia CAD History of CVA - Continue home atorvastatin and aspirin - Continue home isosorbide   Chronic pancreatitis - Continue home Creon  Diabetes type 2 with Diabetic neuropathy > 24 units daily and SSI at facility - decrease long acting - Continue home gabapentin   CKD 3B > Creatinine stable 1.88 in the ED - Trend renal function electrolytes   Dementia - delirium precautions   DVT prophylaxis: enoxaparin (LOVENOX) injection 30 mg Start: 11/16/21 1000    Code Status: DNR   Disposition Plan:  Level of care: Med-Surg Status is: inpt     Consultants:  Podiatry    Subjective: Still with cough  Objective: Vitals:   11/17/21 0051 11/17/21 0545 11/17/21 0729 11/17/21 0847  BP: (!) 141/75 (!) 153/75 (!) 145/75   Pulse: 91 93 93   Resp: '18 16 18 18  ' Temp: 98.6 F (37 C) 99.1 F (37.3 C) 98.9 F (37.2 C)   TempSrc: Oral Oral Oral   SpO2: 97% 98% 99% 98%    Intake/Output Summary (Last 24 hours) at 11/17/2021 1313 Last data filed at 11/17/2021 1205 Gross per 24 hour  Intake 179.23 ml  Output 1600 ml  Net -1420.77 ml   There were no vitals filed for this visit.  Examination:   General: Appearance:    elderly male in no acute distress     Lungs:     Diminished on right side, respirations unlabored  Heart:    Normal heart rate.   MS:   All extremities are intact.   Neurologic:   Awake, alert  Data Reviewed: I have personally reviewed following labs and imaging studies  CBC: Recent Labs  Lab 11/15/21 1839 11/16/21 0352 11/17/21 0113  WBC 16.8* 16.3* 17.8*  NEUTROABS 12.5*  --   --   HGB 9.8* 9.9* 10.0*  HCT 29.6* 30.0* 30.4*  MCV 91.6 90.1 89.9  PLT 258 257 270   Basic Metabolic Panel: Recent Labs  Lab 11/15/21 1839 11/16/21 0352 11/17/21 0113  NA 131* 132* 133*  K 4.2 3.5 3.7  CL 100 104 107  CO2 23 21* 21*  GLUCOSE 326* 156* 73  BUN 35* 30* 29*  CREATININE  1.88* 1.83* 1.87*  CALCIUM 8.5* 8.6* 8.6*   GFR: CrCl cannot be calculated (Unknown ideal weight.). Liver Function Tests: Recent Labs  Lab 11/15/21 1839 11/16/21 0352  AST 24 17  ALT 17 17  ALKPHOS 96 92  BILITOT 0.7 0.7  PROT 8.7* 8.7*  ALBUMIN 2.7* 2.5*   No results for input(s): "LIPASE", "AMYLASE" in the last 168 hours. No results for input(s): "AMMONIA" in the last 168 hours. Coagulation Profile: No results for input(s): "INR", "PROTIME" in the last 168 hours. Cardiac Enzymes: No results for input(s): "CKTOTAL", "CKMB", "CKMBINDEX", "TROPONINI" in the last 168 hours. BNP (last 3 results) No results for input(s): "PROBNP" in the last 8760 hours. HbA1C: No results for input(s): "HGBA1C" in the last 72 hours. CBG: Recent Labs  Lab 11/16/21 1237 11/16/21 2116 11/17/21 0730 11/17/21 0801 11/17/21 1133  GLUCAP 92 86 41* 82 83   Lipid Profile: No results for input(s): "CHOL", "HDL", "LDLCALC", "TRIG", "CHOLHDL", "LDLDIRECT" in the last 72 hours. Thyroid Function Tests: No results for input(s): "TSH", "T4TOTAL", "FREET4", "T3FREE", "THYROIDAB" in the last 72 hours. Anemia Panel: No results for input(s): "VITAMINB12", "FOLATE", "FERRITIN", "TIBC", "IRON", "RETICCTPCT" in the last 72 hours. Sepsis Labs: Recent Labs  Lab 11/15/21 1839  LATICACIDVEN 1.5    Recent Results (from the past 240 hour(s))  Blood culture (routine x 2)     Status: None (Preliminary result)   Collection Time: 11/15/21  6:52 PM   Specimen: BLOOD  Result Value Ref Range Status   Specimen Description BLOOD SITE NOT SPECIFIED  Final   Special Requests   Final    BOTTLES DRAWN AEROBIC ONLY Blood Culture adequate volume   Culture   Final    NO GROWTH 2 DAYS Performed at [facility], [address] [place], [place] [address]    Report Status PENDING  Incomplete  Blood culture (routine x 2)     Status: None (Preliminary result)   Collection Time: 11/15/21  9:51 PM   Specimen: BLOOD  RIGHT HAND  Result Value Ref Range Status   Specimen Description BLOOD RIGHT HAND  Final   Special Requests   Final    BOTTLES DRAWN AEROBIC AND ANAEROBIC Blood Culture adequate volume   Culture   Final    NO GROWTH 2 DAYS Performed at [facility], [place] [address], [place], [place] [address]    Report Status PENDING  Incomplete  MRSA Next Gen by PCR, Nasal     Status: Abnormal   Collection Time: 11/16/21  6:50 AM   Specimen: Nasal Mucosa; Nasal Swab  Result Value Ref Range Status   MRSA by PCR Next Gen DETECTED (A) NOT DETECTED Final    Comment: RESULT CALLED TO, READ BACK BY AND VERIFIED WITH:  11/16/21 8:43  RN CAMRYN COGAN (NOTE) The GeneXpert MRSA Assay (FDA approved for NASAL specimens only), is one  component of a comprehensive MRSA colonization surveillance program. It is not intended to diagnose MRSA infection nor to guide or monitor treatment for MRSA infections. Test performance is not FDA approved in patients less than 68 years old. Performed at [facility], [place] [address], [place], [place] [address]          Radiology Studies: DG CHEST PORT 1 VIEW  Result Date: 11/17/2021 CLINICAL DATA:  Fever EXAM: PORTABLE CHEST 1 VIEW COMPARISON:  Chest x-ray dated May 21, 2021 FINDINGS: Cardiac and mediastinal contours within normal limits. New focal consolidation of the right mid lung. Bibasilar atelectasis. Linear opacity of the left mid lung, unchanged when compared prior and likely due to scarring. Large pleural effusion or pneumothorax. IMPRESSION: New focal consolidation of the right mid lung, concerning for infection. Recommend follow-up PA and lateral chest x-ray in 6-8 weeks to ensure resolution. Electronically Signed   By: Yetta Glassman M.D.   On: 11/17/2021 08:06   VAS [place] ABI WITH/WO TBI  Result Date: 11/16/2021  LOWER EXTREMITY DOPPLER STUDY Patient Name:  MOHAMMEDALI BEDOY  Date of Exam:   11/16/2021 Medical Rec #: [id number]         Accession #:     [id number] Date of Birth: 1934/06/24         Patient Gender: M Patient Age:   41 years Exam Location:  [facility] Procedure:      VAS [place] ABI WITH/WO TBI Referring Phys: ADAM MCDONALD --------------------------------------------------------------------------------  Indications: Ulceration. High Risk Factors: Hypertension, hyperlipidemia, Diabetes, past history of                    smoking, prior CVA.  Limitations: Today's exam was limited due to Movement. Comparison Study: No prior study on file Performing Technologist: Sharion Dove RVS  Examination Guidelines: A complete evaluation includes at minimum, Doppler waveform signals and systolic blood pressure reading at the level of bilateral brachial, anterior tibial, and posterior tibial arteries, when vessel segments are accessible. Bilateral testing is considered an integral part of a complete examination. Photoelectric Plethysmograph (PPG) waveforms and toe systolic pressure readings are included as required and additional duplex testing as needed. Limited examinations for reoccurring indications may be performed as noted.  ABI Findings: +---------+------------------+-----+-----------+--------+ Right    Rt Pressure (mmHg)IndexWaveform   Comment  +---------+------------------+-----+-----------+--------+ Brachial 159                    triphasic           +---------+------------------+-----+-----------+--------+ PTA      102               0.64 multiphasic         +---------+------------------+-----+-----------+--------+ DP       120               0.75 multiphasic         +---------+------------------+-----+-----------+--------+ Great Toe54                0.34                     +---------+------------------+-----+-----------+--------+ +---------+------------------+-----+-----------+------------------+ Left     Lt Pressure (mmHg)IndexWaveform   Comment             +---------+------------------+-----+-----------+------------------+ Brachial 148                    triphasic                     +---------+------------------+-----+-----------+------------------+  PTA      136               0.86            Sounds multiphasic +---------+------------------+-----+-----------+------------------+ DP       130               0.82 multiphasic                   +---------+------------------+-----+-----------+------------------+ Great Toe162               1.02                               +---------+------------------+-----+-----------+------------------+ +-------+-----------+-----------+------------+------------+ ABI/TBIToday's ABIToday's TBIPrevious ABIPrevious TBI +-------+-----------+-----------+------------+------------+ Right  0.75       0.34                                +-------+-----------+-----------+------------+------------+ Left   0.86       1.02                                +-------+-----------+-----------+------------+------------+  Summary: Right: Resting right ankle-brachial index indicates moderate right lower extremity arterial disease. The right toe-brachial index is abnormal. Left: Resting left ankle-brachial index indicates mild left lower extremity arterial disease. The left toe-brachial index is normal. *See table(s) above for measurements and observations.     Preliminary    MR FOOT RIGHT WO CONTRAST  Result Date: 11/16/2021 CLINICAL DATA:  Foot swelling, diabetic, osteomyelitis suspected, xray done diabetic foot infection EXAM: MRI OF THE RIGHT FOREFOOT WITHOUT CONTRAST TECHNIQUE: Multiplanar, multisequence MR imaging of the right forefoot was performed. No intravenous contrast was administered. COMPARISON:  Right foot radiograph 11/15/2021 FINDINGS: Motion degraded exam. Bones/Joint/Cartilage There is erosion of the great toe distal phalangeal tuft as seen radiographically. There is questionable minimal marrow edema  at the residual tip of the distal phalanx but largely preserved marrow signal. No other significant marrow signal alteration in the forefoot. Ligaments Poorly evaluate due to motion artifact. Muscles and Tendons There is diffuse muscle edema muscle atrophy in the foot as is commonly seen in diabetics. No acute tendon tear in the forefoot. Soft tissues Diffuse soft tissue swelling of the foot. No defined fluid collection. IMPRESSION: Distal great toe ulceration with erosion of the distal phalangeal tuft and questionable minimal edema signal at the tip of the residual distal phalanx. There is largely preserved marrow signal. Findings could reflect very early osteomyelitis or reactive marrow change. No evidence of soft tissue abscess. Electronically Signed   By: Maurine Simmering M.D.   On: 11/16/2021 07:21   DG Foot Complete Right  Result Date: 11/15/2021 CLINICAL DATA:  Right foot pain EXAM: RIGHT FOOT COMPLETE - 3+ VIEW COMPARISON:  None Available. FINDINGS: Osseous structures are diffusely osteopenic. Ulcer involving the a distal aspect of the right great toe noted with erosion of the terminal distal phalanx which is likely exposed at the base of the ulcer. No acute fracture or dislocation. Advanced vascular calcifications are seen within the right foot. IMPRESSION: Ulcer involving the distal aspect of the right great toe with erosion of the terminal distal phalanx, likely exposed at the base of the ulcer. Electronically Signed   By: Fidela Salisbury M.D.   On: 11/15/2021 19:46  Scheduled Meds:  atorvastatin  40 mg Oral Daily   azithromycin  500 mg Oral Daily   enoxaparin (LOVENOX) injection  30 mg Subcutaneous Daily   fluticasone furoate-vilanterol  1 puff Inhalation Daily   gabapentin  100 mg Oral Daily   gabapentin  400 mg Oral QHS   insulin aspart  0-5 Units Subcutaneous QHS   insulin aspart  0-9 Units Subcutaneous TID WC   [START ON 11/18/2021] insulin glargine-yfgn  10 Units Subcutaneous Daily    lipase/protease/amylase  24,000 Units Oral TID AC   nystatin   Topical TID   pantoprazole  40 mg Oral Daily   sodium chloride flush  3 mL Intravenous Q12H   Continuous Infusions:  ceFEPime (MAXIPIME) IV 2 g (11/17/21 1030)   vancomycin       LOS: 1 day    Time spent: 45 minutes spent on chart review, discussion with nursing staff, consultants, updating family and interview/physical exam; more than 50% of that time was spent in counseling and/or coordination of care.    Geradine Girt, DO [facility] Available via Epic secure chat 7am-7pm After these hours, please refer to coverage provider listed on [email or web address] 11/17/2021, 1:13 PM

## 2021-11-17 NOTE — Evaluation (Signed)
Clinical/Bedside Swallow Evaluation Patient Details  Name: Justin [name] MRN: [id number] Date of Birth: [birth year]  Today's Date: 11/17/2021 Time: SLP Start Time (ACUTE ONLY): 1200 SLP Stop Time (ACUTE ONLY): 4854 SLP Time Calculation (min) (ACUTE ONLY): 14 min  Past Medical History:  Past Medical History:  Diagnosis Date   Acute metabolic encephalopathy 10/03/348   Arthritis    Diabetes mellitus    HTN (hypertension)    Hypercholesteremia    Iron deficiency    Pancreatitis, acute 06/02/2012   Diagnosed 05/2012    Personal history of colonic adenomas 04/15/2013   04/15/2013 2 diminutive polyps     Stroke Justin [name])    TIA (transient ischemic attack)    Past Surgical History:  Past Surgical History:  Procedure Laterality Date   ARTERY BIOPSY  02/26/2011   Procedure: BIOPSY TEMPORAL ARTERY;  Surgeon: Justin Blade, MD;  Location: [facility];  Service: General;  Laterality: Left;  Left temporal artery biospy   [place] / LIGATION  02/26/2011   left side   UPPER GASTROINTESTINAL ENDOSCOPY     HPI:  Pt is an 87 y.o. male who presented with worsening right leg and foot pain. CXR 8/11 completed due to fever: New focal consolidation of the right mid lung, concerning for infection. PMH: dementia, CKD 3B, hyperlipidemia, diabetes, neuropathy, history of CVA, CAD, hypertension, chronic pancreatitis, anemia, diabetic foot wound.    Assessment / Plan / Recommendation  Clinical Impression  Pt was seen for bedside swallow evaluation and he denied a history of dysphagia. However, his reliability as a historian is unknown considering his baseline dementia. Oral mechanism exam was Justin [name] and he was edentulous. Pt stated that his dentures are not at the hospital and that he does not always use them with meals. He occasionally exhibited very subtle throat clearing with thin liquids and  oral holding was noted with liquids. However, his swallow mechanism otherwise appeared [name]. Pt's current diet of regular texture solids and thin liquids will be continued. SLP will follow pt to ensure tolerance and to assess need for instrumental assessment. SLP Visit Diagnosis: Dysphagia, unspecified (R13.10)    Aspiration Risk  Mild aspiration risk    Diet Recommendation Regular;Thin liquid   Liquid Administration via: Cup;Straw Medication Administration: Whole meds with liquid Supervision: Staff to assist with self feeding Compensations: Slow rate;Minimize environmental distractions Postural Changes: Seated upright at 90 degrees    Other  Recommendations Oral Care Recommendations: Oral care BID    Recommendations for follow up therapy are one component of a multi-disciplinary discharge planning process, led by the attending physician.  Recommendations may be updated based on patient status, additional functional criteria and insurance authorization.  Follow up Recommendations  (TBD)      Assistance Recommended at Discharge Frequent or constant Supervision/Assistance  Functional Status Assessment Patient has had a recent decline in their functional status and/or demonstrates limited ability to make significant improvements in function in a reasonable and predictable amount of time  Frequency and Duration min 1 x/week  1 week       Prognosis Prognosis for Safe Diet Advancement: Good Barriers to Reach Goals: Cognitive deficits      Swallow Study   General Date of Onset: 11/16/21 HPI: Pt is an 87 y.o. male who presented with worsening right leg and foot pain. CXR  8/11 completed due to fever: New focal consolidation of the right mid lung, concerning for infection. PMH: dementia, CKD 3B, hyperlipidemia, diabetes, neuropathy, history of CVA, CAD, hypertension, chronic pancreatitis, anemia, diabetic foot wound. Type of Study: Bedside Swallow Evaluation Previous Swallow Assessment:  none Diet Prior to this Study: Regular;Thin liquids Temperature Spikes Noted: No Respiratory Status: Room air History of Recent Intubation: No Behavior/Cognition: Alert;Cooperative;Pleasant mood Oral Cavity Assessment: Within Functional Limits Oral Care Completed by SLP: No Oral Cavity - Dentition: Edentulous;Dentures, not available Vision: Functional for self-feeding Self-Feeding Abilities: Needs assist Patient Positioning: Upright in bed;Postural control adequate for testing Baseline Vocal Quality: Normal Volitional Cough: Strong Volitional Swallow: Able to elicit    Oral/Motor/Sensory Function Overall Oral Motor/Sensory Function: Within functional limits   Ice Chips Ice chips: Within functional limits Presentation: Spoon   Thin Liquid Thin Liquid: Impaired Presentation: Straw Oral Phase Functional Implications: Oral holding Pharyngeal  Phase Impairments: Throat Clearing - Immediate (very subtle and inconsistent)    Nectar Thick Nectar Thick Liquid: Not tested   Honey Thick Honey Thick Liquid: Not tested   Puree Puree: Within functional limits Presentation: Spoon   Solid     Solid: Within functional limits     Justin [name] I. Justin [name], Justin [name], Justin [name] Office number [phone]  Justin [name] 11/17/2021,12:30 PM

## 2021-11-18 DIAGNOSIS — L089 Local infection of the skin and subcutaneous tissue, unspecified: Secondary | ICD-10-CM | POA: Diagnosis not present

## 2021-11-18 DIAGNOSIS — E11628 Type 2 diabetes mellitus with other skin complications: Secondary | ICD-10-CM | POA: Diagnosis not present

## 2021-11-18 LAB — CBC
HCT: 28.8 % — ABNORMAL LOW (ref 39.0–52.0)
Hemoglobin: 9.7 g/dL — ABNORMAL LOW (ref 13.0–17.0)
MCH: 30.3 pg (ref 26.0–34.0)
MCHC: 33.7 g/dL (ref 30.0–36.0)
MCV: 90 fL (ref 80.0–100.0)
Platelets: 249 10*3/uL (ref 150–400)
RBC: 3.2 MIL/uL — ABNORMAL LOW (ref 4.22–5.81)
RDW: 13.7 % (ref 11.5–15.5)
WBC: 15 10*3/uL — ABNORMAL HIGH (ref 4.0–10.5)
nRBC: 0 % (ref 0.0–0.2)

## 2021-11-18 LAB — BASIC METABOLIC PANEL
Anion gap: 9 (ref 5–15)
BUN: 30 mg/dL — ABNORMAL HIGH (ref 8–23)
CO2: 21 mmol/L — ABNORMAL LOW (ref 22–32)
Calcium: 8.6 mg/dL — ABNORMAL LOW (ref 8.9–10.3)
Chloride: 105 mmol/L (ref 98–111)
Creatinine, Ser: 1.91 mg/dL — ABNORMAL HIGH (ref 0.61–1.24)
GFR, Estimated: 34 mL/min — ABNORMAL LOW (ref >60)
Glucose, Bld: 131 mg/dL — ABNORMAL HIGH (ref 70–99)
Potassium: 3.8 mmol/L (ref 3.5–5.1)
Sodium: 135 mmol/L (ref 135–145)

## 2021-11-18 LAB — GLUCOSE, CAPILLARY
Glucose-Capillary: 105 mg/dL — ABNORMAL HIGH (ref 70–99)
Glucose-Capillary: 136 mg/dL — ABNORMAL HIGH (ref 70–99)
Glucose-Capillary: 100 mg/dL — ABNORMAL HIGH (ref 70–99)
Glucose-Capillary: 123 mg/dL — ABNORMAL HIGH (ref 70–99)

## 2021-11-18 MED ORDER — SODIUM CHLORIDE 0.9 % IV SOLN: INTRAVENOUS | Status: DC

## 2021-11-18 NOTE — Progress Notes (Signed)
Patient is having a minimal nose bleed to left nose,On-call provider informed,ice applied,will continue to monitor.

## 2021-11-18 NOTE — Progress Notes (Signed)
Pharmacy Antibiotic Note  Justin [name] is a 87 y.o. male admitted on 11/15/2021 with  diabetic foot infection and possible pneumonia . WBC 15, afebrile, SCr 1.91. Pharmacy has been consulted for vancomycin and cefepime dosing.  Plan: - Continue vancomycin IV 1750 mg Q48H - Continue cefepime IV 2 g Q12H - Continue azithromycin PO 500 mg daily per MD - Monitor clinical status, renal function, and cultures - Obtain vancomycin peak and trough levels when indicated - De-escalate therapy when able    Temp (24hrs), Avg:98.3 F (36.8 C), Min:98 F (36.7 C), Max:98.5 F (36.9 C)  Recent Labs  Lab 11/15/21 1839 11/16/21 0352 11/17/21 0113 11/18/21 0054  WBC 16.8* 16.3* 17.8* 15.0*  CREATININE 1.88* 1.83* 1.87* 1.91*  LATICACIDVEN 1.5  --   --   --     CrCl cannot be calculated (Unknown ideal weight.).    No Known Allergies  Antimicrobials this admission: Azithromycin 8/11 >> Cefepime 8/9 >>  Vancomycin 8/9 >> Metronidazole 8/9 >> 8/10  Dose adjustments this admission: N/A  Microbiology results: 8/9 BCx: NGTD 8/10 MRSA PCR: positive  Thank you for allowing pharmacy to be a part of this patient's care.  Shauna Hugh, PharmD, MS 11/18/2021 12:42 PM  Please check [email or web address] for unit-specific pharmacy phone numbers.

## 2021-11-18 NOTE — Progress Notes (Signed)
PROGRESS NOTE    Justin [name]  SEG:[id number] DOB: 02/04/1935 DOA: 11/15/2021 PCP: [facility]    Brief Narrative:   Justin [name] is a 87 y.o. male with medical history significant of dementia, CKD 3B, hyperlipidemia, diabetes, neuropathy, history of CVA, CAD, hypertension, chronic pancreatitis, anemia, diabetic foot wound presenting with worsening right leg and foot pain.  History obtained with assistance of family and chart review due to patient's reported dementia.  Patient has been at rehab facility following a stroke in February.  Does have some residual right-sided deficits.  His right foot has been in pain for the past couple weeks per her daughter and has had some swelling in that period.  This has increased significantly over the past couple days with increased swelling overnight.  Does have known history of diabetic foot wounds per daughter.  Assessment and Plan: Suspected pna -added azithromycin to current abx and d/c'd flagyl -PRN O2 -pulmonary toilet -SLP eval done  Diabetic foot infection with ?Osteomyelitis > Patient presenting with redness warmth and swelling of the right lower extremity with wound at the base of the foot.  Imaging showing evidence of possible bone exposure, on exam warmth redness and swelling of right lower extremity especially foot, ulcerations on the toes. > ESR and CRP noted to be elevated in the ED in addition to the leukocytosis of 16.8. > Patient started on cefepime and Flagyl -MRI: Distal great toe ulceration with erosion of the distal phalangeal tuft and questionable minimal edema signal at the tip of the residual distal phalanx. There is largely preserved marrow signal. Findings could reflect very early osteomyelitis or reactive marrow change. No evidence of soft tissue abscess -podiatry following - MRSA screen +, continue vancomycin for now - Follow-up blood cultures - Pain control as needed -WOC -Plan right lower  extremity angiogram with possible intervention via left common femoral approach in cath lab Monday 11/20/21   Intertrigo > Noted of his groin while in the ED. - Start nystatin powder 3 times daily   Hyperlipidemia CAD History of CVA - Continue home atorvastatin and aspirin - Continue home isosorbide   Chronic pancreatitis - Continue home Creon  Diabetes type 2 with Diabetic neuropathy > 24 units daily and SSI at facility - decrease long acting - Continue home gabapentin   CKD 3B > Creatinine stable 1.88 in the ED - Trend renal function electrolytes   Dementia - delirium precautions   DVT prophylaxis: enoxaparin (LOVENOX) injection 30 mg Start: 11/16/21 1000 spoke with daughter 8/11   Code Status: DNR   Disposition Plan:  Level of care: Med-Surg Status is: inpt     Consultants:  Podiatry    Subjective: No complaints  Objective: Vitals:   11/17/21 2125 11/18/21 0438 11/18/21 0816 11/18/21 0816  BP: (!) 147/89 120/68 134/65 134/65  Pulse: 85 88 88 88  Resp: '18 18 16 16  ' Temp: 98.3 F (36.8 C) 98.5 F (36.9 C) 98.3 F (36.8 C) 98.3 F (36.8 C)  TempSrc: Oral Oral Oral Oral  SpO2: 98% 97% 96% 100%    Intake/Output Summary (Last 24 hours) at 11/18/2021 1209 Last data filed at 11/17/2021 2237 Gross per 24 hour  Intake --  Output 500 ml  Net -500 ml   There were no vitals filed for this visit.  Examination:    General: Appearance:    Well developed, well nourished male in no acute distress     Lungs:      respirations unlabored  Heart:    Normal heart rate.   MS:   All extremities are intact.   Neurologic:   Awake, alert         Data Reviewed: I have personally reviewed following labs and imaging studies  CBC: Recent Labs  Lab 11/15/21 1839 11/16/21 0352 11/17/21 0113 11/18/21 0054  WBC 16.8* 16.3* 17.8* 15.0*  NEUTROABS 12.5*  --   --   --   HGB 9.8* 9.9* 10.0* 9.7*  HCT 29.6* 30.0* 30.4* 28.8*  MCV 91.6 90.1 89.9 90.0  PLT 258  257 271 448   Basic Metabolic Panel: Recent Labs  Lab 11/15/21 1839 11/16/21 0352 11/17/21 0113 11/18/21 0054  NA 131* 132* 133* 135  K 4.2 3.5 3.7 3.8  CL 100 104 107 105  CO2 23 21* 21* 21*  GLUCOSE 326* 156* 73 131*  BUN 35* 30* 29* 30*  CREATININE 1.88* 1.83* 1.87* 1.91*  CALCIUM 8.5* 8.6* 8.6* 8.6*   GFR: CrCl cannot be calculated (Unknown ideal weight.). Liver Function Tests: Recent Labs  Lab 11/15/21 1839 11/16/21 0352  AST 24 17  ALT 17 17  ALKPHOS 96 92  BILITOT 0.7 0.7  PROT 8.7* 8.7*  ALBUMIN 2.7* 2.5*   No results for input(s): "LIPASE", "AMYLASE" in the last 168 hours. No results for input(s): "AMMONIA" in the last 168 hours. Coagulation Profile: No results for input(s): "INR", "PROTIME" in the last 168 hours. Cardiac Enzymes: No results for input(s): "CKTOTAL", "CKMB", "CKMBINDEX", "TROPONINI" in the last 168 hours. BNP (last 3 results) No results for input(s): "PROBNP" in the last 8760 hours. HbA1C: No results for input(s): "HGBA1C" in the last 72 hours. CBG: Recent Labs  Lab 11/17/21 0801 11/17/21 1133 11/17/21 1543 11/17/21 2128 11/18/21 0819  GLUCAP 82 83 153* 117* 105*   Lipid Profile: No results for input(s): "CHOL", "HDL", "LDLCALC", "TRIG", "CHOLHDL", "LDLDIRECT" in the last 72 hours. Thyroid Function Tests: No results for input(s): "TSH", "T4TOTAL", "FREET4", "T3FREE", "THYROIDAB" in the last 72 hours. Anemia Panel: No results for input(s): "VITAMINB12", "FOLATE", "FERRITIN", "TIBC", "IRON", "RETICCTPCT" in the last 72 hours. Sepsis Labs: Recent Labs  Lab 11/15/21 1839  LATICACIDVEN 1.5    Recent Results (from the past 240 hour(s))  Blood culture (routine x 2)     Status: None (Preliminary result)   Collection Time: 11/15/21  6:52 PM   Specimen: BLOOD  Result Value Ref Range Status   Specimen Description BLOOD SITE NOT SPECIFIED  Final   Special Requests   Final    BOTTLES DRAWN AEROBIC ONLY Blood Culture adequate volume    Culture   Final    NO GROWTH 3 DAYS Performed at [facility], [address], [place], [place] [address]    Report Status PENDING  Incomplete  Blood culture (routine x 2)     Status: None (Preliminary result)   Collection Time: 11/15/21  9:51 PM   Specimen: BLOOD RIGHT HAND  Result Value Ref Range Status   Specimen Description BLOOD RIGHT HAND  Final   Special Requests   Final    BOTTLES DRAWN AEROBIC AND ANAEROBIC Blood Culture adequate volume   Culture   Final    NO GROWTH 3 DAYS Performed at [facility], [place] [address], [place], [place] [address]    Report Status PENDING  Incomplete  MRSA Next Gen by PCR, Nasal     Status: Abnormal   Collection Time: 11/16/21  6:50 AM   Specimen: Nasal Mucosa; Nasal Swab  Result Value Ref Range Status   MRSA by PCR Next Gen DETECTED (A) NOT DETECTED Final    Comment: RESULT CALLED TO, READ BACK BY AND VERIFIED WITH:  11/16/21 8:43  RN CAMRYN COGAN (NOTE) The GeneXpert MRSA Assay (FDA approved for NASAL specimens only), is one component of a comprehensive MRSA colonization surveillance program. It is not intended to diagnose MRSA infection nor to guide or monitor treatment for MRSA infections. Test performance is not FDA approved in patients less than 40 years old. Performed at [facility], [place] [address], [place], [place] [address]          Radiology Studies: DG Swallowing Func-Speech Pathology  Result Date: 11/17/2021 Table formatting from the original result was not included. Objective Swallowing Evaluation: Type of Study: MBS-Modified Barium Swallow Study  Patient Details Name: AIKAM VINJE MRN: [id number] Date of Birth: 1934-12-05 Today's Date: 11/17/2021 Time: SLP Start Time (ACUTE ONLY): 3220 -SLP Stop Time (ACUTE ONLY): 2542 SLP Time Calculation (min) (ACUTE ONLY): 13 min Past Medical History: Past Medical History: Diagnosis Date  Acute metabolic encephalopathy 10/13/2374  Arthritis   Diabetes mellitus   HTN  (hypertension)   Hypercholesteremia   Iron deficiency   Pancreatitis, acute 06/02/2012  Diagnosed 05/2012   Personal history of colonic adenomas 04/15/2013  04/15/2013 2 diminutive polyps    Stroke [facility])   TIA (transient ischemic attack)  Past Surgical History: Past Surgical History: Procedure Laterality Date  ARTERY BIOPSY  02/26/2011  Procedure: BIOPSY TEMPORAL ARTERY;  Surgeon: Willey Blade, MD;  Location: [facility];  Service: General;  Laterality: Left;  Left temporal artery biospy  [place] / LIGATION  02/26/2011  left side  UPPER GASTROINTESTINAL ENDOSCOPY   HPI: Pt is an 87 y.o. male who presented with worsening right leg and foot pain. CXR 8/11 completed due to fever: New focal consolidation of the right mid lung, concerning for infection. PMH: dementia, CVA, GERD, CKD 3B, hyperlipidemia, diabetes, neuropathy, history of CVA, CAD, hypertension, chronic pancreatitis, anemia, diabetic foot wound.  No data recorded  Recommendations for follow up therapy are one component of a multi-disciplinary discharge planning process, led by the attending physician.  Recommendations may be updated based on patient status, additional functional criteria and insurance authorization. Assessment / Plan / Recommendation   11/17/2021   1:50 PM Clinical Impressions Clinical Impression Pt exhibited mild oral holding and lingual pumping with puree and liquids, but his oropharyngeal swallow mechainsm was otherwise [facility] with adequate movement and strength of oral and pharyngeal structures. No pharyngeal residue was noted throughout the study and pharyngeal stripping was WNL. Esophageal sweep revealed stasis of barium in the upper thoracic esophagus which was cleared with additional boluses of thin liquids. Considering esophageal stasis and pt's diagnosis of GERD, post-prandial aspiration is questioned. The etiology of  esophageal stasis cannot be determined with this study; consider esophageal assessment (e.g., esophagram). Pt's current diet will be continued and SLP will follow briefly for education, but further SLP services will likely not be clinically indicated beyond that point. SLP Visit Diagnosis Dysphagia, oral phase (R13.11) Impact on safety and function Mild aspiration risk     11/17/2021   1:50 PM Treatment Recommendations Treatment Recommendations Therapy as outlined in treatment plan below     11/17/2021   1:50 PM Prognosis Prognosis for Safe Diet Advancement Good Barriers to  Reach Goals Cognitive deficits   11/17/2021   1:50 PM Diet Recommendations SLP Diet Recommendations Regular solids;Thin liquid Liquid Administration via Cup;Straw Medication Administration Whole meds with liquid Compensations Slow rate;Follow solids with liquid;Minimize environmental distractions Postural Changes Seated upright at 90 degrees     11/17/2021   1:50 PM Other Recommendations Recommended Consults Consider esophageal assessment Oral Care Recommendations Oral care BID Follow Up Recommendations No SLP follow up Assistance recommended at discharge Frequent or constant Supervision/Assistance Functional Status Assessment Patient has not had a recent decline in their functional status   11/17/2021   1:50 PM Frequency and Duration  Speech Therapy Frequency (ACUTE ONLY) min 1 x/week Treatment Duration 1 week     11/17/2021   1:50 PM Oral Phase Oral Phase Impaired Oral - Thin Cup Holding of bolus;Lingual pumping;Reduced posterior propulsion Oral - Thin Straw Holding of bolus;Lingual pumping;Reduced posterior propulsion Oral - Puree Holding of bolus;Lingual pumping Oral - Regular WFL Oral - Pill Lingual pumping    11/17/2021   1:50 PM Pharyngeal Phase Pharyngeal Phase [facility]    11/17/2021   1:50 PM Cervical Esophageal Phase  Cervical Esophageal Phase [facility] Shanika I. Hardin Negus, [place], [place] Office number [phone] [name] 11/17/2021, 3:12 PM                     DG CHEST PORT 1 VIEW  Result Date: 11/17/2021 CLINICAL DATA:  Fever EXAM: PORTABLE CHEST 1 VIEW COMPARISON:  Chest x-ray dated May 21, 2021 FINDINGS: Cardiac and mediastinal contours within normal limits. New focal consolidation of the right mid lung. Bibasilar atelectasis. Linear opacity of the left mid lung, unchanged when compared prior and likely due to scarring. Large pleural effusion or pneumothorax. IMPRESSION: New focal consolidation of the right mid lung, concerning for infection. Recommend follow-up PA and lateral chest x-ray in 6-8 weeks to ensure resolution. Electronically Signed   By: Yetta Glassman M.D.   On: 11/17/2021 08:06        Scheduled Meds:  atorvastatin  40 mg Oral Daily   azithromycin  500 mg Oral Daily   enoxaparin (LOVENOX) injection  30 mg Subcutaneous Daily   fluticasone furoate-vilanterol  1 puff Inhalation Daily   gabapentin  100 mg Oral Daily   gabapentin  400 mg Oral QHS   insulin aspart  0-5 Units Subcutaneous QHS   insulin aspart  0-9 Units Subcutaneous TID WC   insulin glargine-yfgn  10 Units Subcutaneous Daily   lipase/protease/amylase  24,000 Units Oral TID AC   nystatin   Topical TID   pantoprazole  40 mg Oral Daily   sodium chloride flush  3 mL Intravenous Q12H   Continuous Infusions:  ceFEPime (MAXIPIME) IV 2 g (11/18/21 0836)   vancomycin 1,750 mg (11/17/21 2349)     LOS: 2 days    Time spent: 45 minutes spent on chart review, discussion with nursing staff, consultants, updating family and interview/physical exam; more than 50% of that time was spent in counseling and/or coordination of care.    Geradine Girt, DO [facility] Available via Epic secure chat 7am-7pm After these hours, please refer to coverage provider listed on [email or web address] 11/18/2021, 12:09 PM

## 2021-11-19 ENCOUNTER — Inpatient Hospital Stay (HOSPITAL_COMMUNITY): Payer: Medicare Other

## 2021-11-19 DIAGNOSIS — E11628 Type 2 diabetes mellitus with other skin complications: Secondary | ICD-10-CM | POA: Diagnosis not present

## 2021-11-19 DIAGNOSIS — L089 Local infection of the skin and subcutaneous tissue, unspecified: Secondary | ICD-10-CM | POA: Diagnosis not present

## 2021-11-19 DIAGNOSIS — R4182 Altered mental status, unspecified: Secondary | ICD-10-CM

## 2021-11-19 LAB — BASIC METABOLIC PANEL
Anion gap: 7 (ref 5–15)
BUN: 33 mg/dL — ABNORMAL HIGH (ref 8–23)
CO2: 19 mmol/L — ABNORMAL LOW (ref 22–32)
Calcium: 8.7 mg/dL — ABNORMAL LOW (ref 8.9–10.3)
Chloride: 111 mmol/L (ref 98–111)
Creatinine, Ser: 1.94 mg/dL — ABNORMAL HIGH (ref 0.61–1.24)
GFR, Estimated: 33 mL/min — ABNORMAL LOW (ref >60)
Glucose, Bld: 107 mg/dL — ABNORMAL HIGH (ref 70–99)
Potassium: 3.9 mmol/L (ref 3.5–5.1)
Sodium: 137 mmol/L (ref 135–145)

## 2021-11-19 LAB — GLUCOSE, CAPILLARY
Glucose-Capillary: 106 mg/dL — ABNORMAL HIGH (ref 70–99)
Glucose-Capillary: 109 mg/dL — ABNORMAL HIGH (ref 70–99)
Glucose-Capillary: 116 mg/dL — ABNORMAL HIGH (ref 70–99)
Glucose-Capillary: 119 mg/dL — ABNORMAL HIGH (ref 70–99)
Glucose-Capillary: 105 mg/dL — ABNORMAL HIGH (ref 70–99)

## 2021-11-19 MED ORDER — PANTOPRAZOLE SODIUM 40 MG IV SOLR
40.0000 mg | INTRAVENOUS | Status: DC
  Administered 2021-11-19 – 2021-11-26 (×8): 40 mg via INTRAVENOUS
  Filled 2021-11-19 (×8): qty 10

## 2021-11-19 MED ORDER — PIPERACILLIN-TAZOBACTAM 3.375 G IVPB
3.3750 g | Freq: Three times a day (TID) | INTRAVENOUS | Status: DC
  Administered 2021-11-19 – 2021-11-27 (×22): 3.375 g via INTRAVENOUS
  Filled 2021-11-19 (×22): qty 50

## 2021-11-19 MED ORDER — SODIUM CHLORIDE 0.9 % IV SOLN
500.0000 mg | INTRAVENOUS | Status: DC
  Administered 2021-11-19 – 2021-11-21 (×3): 500 mg via INTRAVENOUS
  Filled 2021-11-19 (×3): qty 5

## 2021-11-19 MED ORDER — HYDRALAZINE HCL 20 MG/ML IJ SOLN
5.0000 mg | Freq: Four times a day (QID) | INTRAMUSCULAR | Status: DC | PRN
Start: 2021-11-19 — End: 2021-11-28

## 2021-11-19 MED ORDER — MUPIROCIN 2 % EX OINT
1.0000 | TOPICAL_OINTMENT | Freq: Two times a day (BID) | CUTANEOUS | Status: AC
  Administered 2021-11-19 – 2021-11-24 (×10): 1 via NASAL
  Filled 2021-11-19 (×2): qty 22

## 2021-11-19 MED ORDER — GABAPENTIN 300 MG PO CAPS: 300.0000 mg | ORAL_CAPSULE | Freq: Every day | ORAL | Status: DC

## 2021-11-19 MED ORDER — HYDRALAZINE HCL 20 MG/ML IJ SOLN: 2.0000 mg | Freq: Four times a day (QID) | INTRAMUSCULAR | Status: DC | PRN

## 2021-11-19 MED ORDER — CHLORHEXIDINE GLUCONATE CLOTH 2 % EX PADS
6.0000 | MEDICATED_PAD | Freq: Every day | CUTANEOUS | Status: AC
  Administered 2021-11-20 – 2021-11-24 (×5): 6 via TOPICAL

## 2021-11-19 MED ORDER — LORAZEPAM 2 MG/ML IJ SOLN
1.0000 mg | Freq: Once | INTRAMUSCULAR | Status: AC
  Administered 2021-11-19: 1 mg via INTRAVENOUS
  Filled 2021-11-19: qty 1

## 2021-11-19 NOTE — Progress Notes (Signed)
Dressing changed done per orders.

## 2021-11-19 NOTE — Progress Notes (Signed)
Plan for right lower extremity angiogram tomorrow. I discussed this with Justin [name] - who has dementia at baseline.  I reached out to his daughter as well.  She is aware this procedure is being offered to define and treat lower extremity vascular disease in an effort to promote wound healing.  After discussing the risk and benefits, she elected to proceed.  His daughter Justin [name] will be present prior to the procedure in the morning.   NPO midnight please  [name] MD

## 2021-11-19 NOTE — Progress Notes (Signed)
EEG complete - results pending 

## 2021-11-19 NOTE — Procedures (Signed)
History: 87 year old male being evaluated for altered mental status  Sedation: None  Technique: This EEG was acquired with electrodes placed according to the International 10-20 electrode system (including Fp1, Fp2, F3, F4, C3, C4, P3, P4, O1, O2, T3, T4, T5, T6, A1, A2, Fz, Cz, Pz). The following electrodes were missing or displaced: none.   Background: There are generalized periodic discharges with a frequency of 1 to 2 Hz with triphasic morphology.  In addition there is generalized irregular delta and theta activities throughout the recording.  There is no definite posterior dominant rhythm seen.  Photic stimulation: Physiologic driving is now performed  EEG Abnormalities: 1) generalized periodic discharges with triphasic morphology 2) generalized irregular slow activity 3) absent posterior dominant rhythm  Clinical Interpretation: This EEG is most consistent with a generalized nonspecific cerebral dysfunction (encephalopathy).  Though triphasic waves can be associated with cortical irritability at times, would favor an encephalopathic origin at this time.  This pattern can be seen with cefepime induced neurotoxicity as well as other metabolic encephalopathies.  If there is a high concern for seizures, could consider long-term EEG monitoring, but encephalopathic pattern is favored at this time.   Roland Rack, MD [facility] [phone]  If 7pm- 7am, please page neurology on call as listed in [place].

## 2021-11-19 NOTE — Progress Notes (Signed)
PROGRESS NOTE    EL PILE  YPP:[id number] DOB: May 11, 1934 DOA: 11/15/2021 PCP: [facility]    Brief Narrative:   Justin [name] is a 87 y.o. male with medical history significant of dementia, CKD 3B, hyperlipidemia, diabetes, neuropathy, history of CVA, CAD, hypertension, chronic pancreatitis, anemia, diabetic foot wound presenting with worsening right leg and foot pain.  History obtained with assistance of family and chart review due to patient's reported dementia.  Patient has been at rehab facility following a stroke in February.  Does have some residual right-sided deficits.  His right foot has been in pain for the past couple weeks per her daughter and has had some swelling in that period.  This has increased significantly over the past couple days with increased swelling overnight.  Does have known history of diabetic foot wounds per daughter.  Also found to have PNA. 8/13- AM patient less engaged and more confused, not following commands-- unable to get CT scan of brain as patient not cooperative   Assessment and Plan: Suspected pna -added azithromycin to current abx and d/c'd flagyl -PRN O2 -pulmonary toilet -SLP eval done  Diabetic foot infection with ?Osteomyelitis > Patient presenting with redness warmth and swelling of the right lower extremity with wound at the base of the foot.  Imaging showing evidence of possible bone exposure, on exam warmth redness and swelling of right lower extremity especially foot, ulcerations on the toes. > ESR and CRP noted to be elevated in the ED in addition to the leukocytosis of 16.8. > Patient started on cefepime and Flagyl -MRI: Distal great toe ulceration with erosion of the distal phalangeal tuft and questionable minimal edema signal at the tip of the residual distal phalanx. There is largely preserved marrow signal. Findings could reflect very early osteomyelitis or reactive marrow change. No evidence of soft tissue  abscess -podiatry following - MRSA screen +, continue vancomycin for now - Follow-up blood cultures - Pain control as needed -WOC -Plan right lower extremity angiogram with possible intervention via left common femoral approach in cath lab Monday 11/20/21 if not altered and able to cooperate   AMS/encephalopathy -EEG to r/o seizures-- high risk after CVA -CT scan if able to r/o CVA- unclear when mental status change happened -blood sugar >100 -? Delirium due to infection vs medication rxn (Neurontin stopped)  Intertrigo > Noted of his groin while in the ED. - Start nystatin powder 3 times daily   Hyperlipidemia CAD History of CVA - Continue home atorvastatin and aspirin - Continue home isosorbide   Chronic pancreatitis - Continue home Creon  Diabetes type 2 with Diabetic neuropathy > 24 units daily and SSI at facility - decrease long acting - Continue home gabapentin   CKD 3B > Creatinine stable 1.88 in the ED - Trend renal function electrolytes   Dementia - delirium precautions   DVT prophylaxis: enoxaparin (LOVENOX) injection 30 mg Start: 11/16/21 1000 spoke with daughter/son 8/13   Code Status: DNR   Disposition Plan:  Level of care: Med-Surg Status is: inpt     Consultants:  Podiatry    Subjective: Not following commands, will awaken and answer questions but not consistently  Objective: Vitals:   11/18/21 1648 11/18/21 2020 11/19/21 0826 11/19/21 0845  BP: 126/66 138/70 (!) 143/76   Pulse: 86 81 79   Resp: '16 18 16   ' Temp: 98.7 F (37.1 C) 98.5 F (36.9 C) 98.6 F (37 C)   TempSrc: Oral Oral Oral   SpO2:  99% 98% 99% 97%    Intake/Output Summary (Last 24 hours) at 11/19/2021 1340 Last data filed at 11/19/2021 0545 Gross per 24 hour  Intake 240 ml  Output 451 ml  Net -211 ml   There were no vitals filed for this visit.  Examination:    General: Appearance:    Chronically ill appearing male in no acute distress     Lungs:       respirations unlabored  Heart:    Normal heart rate.   MS:   All extremities are intact.   Neurologic:   Awake, alert intermittently         Data Reviewed: I have personally reviewed following labs and imaging studies  CBC: Recent Labs  Lab 11/15/21 1839 11/16/21 0352 11/17/21 0113 11/18/21 0054  WBC 16.8* 16.3* 17.8* 15.0*  NEUTROABS 12.5*  --   --   --   HGB 9.8* 9.9* 10.0* 9.7*  HCT 29.6* 30.0* 30.4* 28.8*  MCV 91.6 90.1 89.9 90.0  PLT 258 257 271 202   Basic Metabolic Panel: Recent Labs  Lab 11/15/21 1839 11/16/21 0352 11/17/21 0113 11/18/21 0054 11/19/21 1020  NA 131* 132* 133* 135 137  K 4.2 3.5 3.7 3.8 3.9  CL 100 104 107 105 111  CO2 23 21* 21* 21* 19*  GLUCOSE 326* 156* 73 131* 107*  BUN 35* 30* 29* 30* 33*  CREATININE 1.88* 1.83* 1.87* 1.91* 1.94*  CALCIUM 8.5* 8.6* 8.6* 8.6* 8.7*   GFR: CrCl cannot be calculated (Unknown ideal weight.). Liver Function Tests: Recent Labs  Lab 11/15/21 1839 11/16/21 0352  AST 24 17  ALT 17 17  ALKPHOS 96 92  BILITOT 0.7 0.7  PROT 8.7* 8.7*  ALBUMIN 2.7* 2.5*   No results for input(s): "LIPASE", "AMYLASE" in the last 168 hours. No results for input(s): "AMMONIA" in the last 168 hours. Coagulation Profile: No results for input(s): "INR", "PROTIME" in the last 168 hours. Cardiac Enzymes: No results for input(s): "CKTOTAL", "CKMB", "CKMBINDEX", "TROPONINI" in the last 168 hours. BNP (last 3 results) No results for input(s): "PROBNP" in the last 8760 hours. HbA1C: No results for input(s): "HGBA1C" in the last 72 hours. CBG: Recent Labs  Lab 11/18/21 1650 11/18/21 2242 11/19/21 0821 11/19/21 0922 11/19/21 1207  GLUCAP 100* 123* 106* 109* 116*   Lipid Profile: No results for input(s): "CHOL", "HDL", "LDLCALC", "TRIG", "CHOLHDL", "LDLDIRECT" in the last 72 hours. Thyroid Function Tests: No results for input(s): "TSH", "T4TOTAL", "FREET4", "T3FREE", "THYROIDAB" in the last 72 hours. Anemia Panel: No  results for input(s): "VITAMINB12", "FOLATE", "FERRITIN", "TIBC", "IRON", "RETICCTPCT" in the last 72 hours. Sepsis Labs: Recent Labs  Lab 11/15/21 1839  LATICACIDVEN 1.5    Recent Results (from the past 240 hour(s))  Blood culture (routine x 2)     Status: None (Preliminary result)   Collection Time: 11/15/21  6:52 PM   Specimen: BLOOD  Result Value Ref Range Status   Specimen Description BLOOD SITE NOT SPECIFIED  Final   Special Requests   Final    BOTTLES DRAWN AEROBIC ONLY Blood Culture adequate volume   Culture   Final    NO GROWTH 4 DAYS Performed at [facility], [address], [place], [place] [address]    Report Status PENDING  Incomplete  Blood culture (routine x 2)     Status: None (Preliminary result)   Collection Time: 11/15/21  9:51 PM   Specimen: BLOOD RIGHT HAND  Result Value Ref Range Status  Specimen Description BLOOD RIGHT HAND  Final   Special Requests   Final    BOTTLES DRAWN AEROBIC AND ANAEROBIC Blood Culture adequate volume   Culture   Final    NO GROWTH 4 DAYS Performed at [facility], [address], [place], [place] [address]    Report Status PENDING  Incomplete  MRSA Next Gen by PCR, Nasal     Status: Abnormal   Collection Time: 11/16/21  6:50 AM   Specimen: Nasal Mucosa; Nasal Swab  Result Value Ref Range Status   MRSA by PCR Next Gen DETECTED (A) NOT DETECTED Final    Comment: RESULT CALLED TO, READ BACK BY AND VERIFIED WITH:  11/16/21 8:43  RN CAMRYN COGAN (NOTE) The GeneXpert MRSA Assay (FDA approved for NASAL specimens only), is one component of a comprehensive MRSA colonization surveillance program. It is not intended to diagnose MRSA infection nor to guide or monitor treatment for MRSA infections. Test performance is not FDA approved in patients less than 92 years old. Performed at [facility], [place] [address] [place], [place] [address]          Radiology Studies: DG Swallowing Func-Speech  Pathology  Result Date: 11/17/2021 Table formatting from the original result was not included. Objective Swallowing Evaluation: Type of Study: MBS-Modified Barium Swallow Study  Patient Details Name: Justin [name] MRN: [id number] Date of Birth: 12-25-1934 Today's Date: 11/17/2021 Time: SLP Start Time (ACUTE ONLY): 4132 -SLP Stop Time (ACUTE ONLY): 4401 SLP Time Calculation (min) (ACUTE ONLY): 13 min Past Medical History: Past Medical History: Diagnosis Date  Acute metabolic encephalopathy 0/27/2536  Arthritis   Diabetes mellitus   HTN (hypertension)   Hypercholesteremia   Iron deficiency   Pancreatitis, acute 06/02/2012  Diagnosed 05/2012   Personal history of colonic adenomas 04/15/2013  04/15/2013 2 diminutive polyps    Stroke [facility])   TIA (transient ischemic attack)  Past Surgical History: Past Surgical History: Procedure Laterality Date  ARTERY BIOPSY  02/26/2011  Procedure: BIOPSY TEMPORAL ARTERY;  Surgeon: Willey Blade, MD;  Location: [place];  Service: General;  Laterality: Left;  Left temporal artery biospy  [place] / LIGATION  02/26/2011  left side  UPPER GASTROINTESTINAL ENDOSCOPY   HPI: Pt is an 87 y.o. male who presented with worsening right leg and foot pain. CXR 8/11 completed due to fever: New focal consolidation of the right mid lung, concerning for infection. PMH: dementia, CVA, GERD, CKD 3B, hyperlipidemia, diabetes, neuropathy, history of CVA, CAD, hypertension, chronic pancreatitis, anemia, diabetic foot wound.  No data recorded  Recommendations for follow up therapy are one component of a multi-disciplinary discharge planning process, led by the attending physician.  Recommendations may be updated based on patient status, additional functional criteria and insurance authorization. Assessment / Plan / Recommendation   11/17/2021   1:50 PM Clinical Impressions Clinical Impression Pt  exhibited mild oral holding and lingual pumping with puree and liquids, but his oropharyngeal swallow mechainsm was otherwise [facility] with adequate movement and strength of oral and pharyngeal structures. No pharyngeal residue was noted throughout the study and pharyngeal stripping was WNL. Esophageal sweep revealed stasis of barium in the upper thoracic esophagus which was cleared with additional boluses of thin liquids. Considering esophageal stasis and pt's diagnosis of GERD, post-prandial aspiration is questioned. The etiology of esophageal stasis cannot  be determined with this study; consider esophageal assessment (e.g., esophagram). Pt's current diet will be continued and SLP will follow briefly for education, but further SLP services will likely not be clinically indicated beyond that point. SLP Visit Diagnosis Dysphagia, oral phase (R13.11) Impact on safety and function Mild aspiration risk     11/17/2021   1:50 PM Treatment Recommendations Treatment Recommendations Therapy as outlined in treatment plan below     11/17/2021   1:50 PM Prognosis Prognosis for Safe Diet Advancement Good Barriers to Reach Goals Cognitive deficits   11/17/2021   1:50 PM Diet Recommendations SLP Diet Recommendations Regular solids;Thin liquid Liquid Administration via Cup;Straw Medication Administration Whole meds with liquid Compensations Slow rate;Follow solids with liquid;Minimize environmental distractions Postural Changes Seated upright at 90 degrees     11/17/2021   1:50 PM Other Recommendations Recommended Consults Consider esophageal assessment Oral Care Recommendations Oral care BID Follow Up Recommendations No SLP follow up Assistance recommended at discharge Frequent or constant Supervision/Assistance Functional Status Assessment Patient has not had a recent decline in their functional status   11/17/2021   1:50 PM Frequency and Duration  Speech Therapy Frequency (ACUTE ONLY) min 1 x/week Treatment Duration 1 week     11/17/2021    1:50 PM Oral Phase Oral Phase Impaired Oral - Thin Cup Holding of bolus;Lingual pumping;Reduced posterior propulsion Oral - Thin Straw Holding of bolus;Lingual pumping;Reduced posterior propulsion Oral - Puree Holding of bolus;Lingual pumping Oral - Regular WFL Oral - Pill Lingual pumping    11/17/2021   1:50 PM Pharyngeal Phase Pharyngeal Phase [facility]    11/17/2021   1:50 PM Cervical Esophageal Phase  Cervical Esophageal Phase [facility] Shanika I. Hardin Negus, [place], [place] Office number [phone] [name] 11/17/2021, 3:12 PM                          Scheduled Meds:  atorvastatin  40 mg Oral Daily   azithromycin  500 mg Oral Daily   enoxaparin (LOVENOX) injection  30 mg Subcutaneous Daily   fluticasone furoate-vilanterol  1 puff Inhalation Daily   insulin aspart  0-5 Units Subcutaneous QHS   insulin aspart  0-9 Units Subcutaneous TID WC   lipase/protease/amylase  24,000 Units Oral TID AC   nystatin   Topical TID   pantoprazole  40 mg Oral Daily   sodium chloride flush  3 mL Intravenous Q12H   Continuous Infusions:  sodium chloride 50 mL/hr at 11/19/21 1004   ceFEPime (MAXIPIME) IV 2 g (11/19/21 1006)   vancomycin 1,750 mg (11/17/21 2349)     LOS: 3 days    Time spent: 45 minutes spent on chart review, discussion with nursing staff, consultants, updating family and interview/physical exam; more than 50% of that time was spent in counseling and/or coordination of care.    Geradine Girt, DO [facility] Available via Epic secure chat 7am-7pm After these hours, please refer to coverage provider listed on [email or web address] 11/19/2021, 1:40 PM

## 2021-11-20 ENCOUNTER — Encounter (HOSPITAL_COMMUNITY)
Admission: EM | Disposition: A | Discharge status: Skilled Nursing Facility | Payer: Self-pay | Source: Skilled Nursing Facility | Attending: Family Medicine

## 2021-11-20 DIAGNOSIS — E11628 Type 2 diabetes mellitus with other skin complications: Secondary | ICD-10-CM | POA: Diagnosis not present

## 2021-11-20 DIAGNOSIS — I96 Gangrene, not elsewhere classified: Secondary | ICD-10-CM

## 2021-11-20 DIAGNOSIS — L089 Local infection of the skin and subcutaneous tissue, unspecified: Secondary | ICD-10-CM | POA: Diagnosis not present

## 2021-11-20 LAB — CBC
HCT: 30.1 % — ABNORMAL LOW (ref 39.0–52.0)
Hemoglobin: 10.2 g/dL — ABNORMAL LOW (ref 13.0–17.0)
MCH: 30.4 pg (ref 26.0–34.0)
MCHC: 33.9 g/dL (ref 30.0–36.0)
MCV: 89.6 fL (ref 80.0–100.0)
Platelets: 259 10*3/uL (ref 150–400)
RBC: 3.36 MIL/uL — ABNORMAL LOW (ref 4.22–5.81)
RDW: 13.8 % (ref 11.5–15.5)
WBC: 14.7 10*3/uL — ABNORMAL HIGH (ref 4.0–10.5)
nRBC: 0 % (ref 0.0–0.2)

## 2021-11-20 LAB — CULTURE, BLOOD (ROUTINE X 2)
Culture: NO GROWTH
Culture: NO GROWTH
Special Requests: ADEQUATE
Special Requests: ADEQUATE

## 2021-11-20 LAB — GLUCOSE, CAPILLARY
Glucose-Capillary: 121 mg/dL — ABNORMAL HIGH (ref 70–99)
Glucose-Capillary: 140 mg/dL — ABNORMAL HIGH (ref 70–99)
Glucose-Capillary: 97 mg/dL (ref 70–99)
Glucose-Capillary: 123 mg/dL — ABNORMAL HIGH (ref 70–99)

## 2021-11-20 LAB — BASIC METABOLIC PANEL
Anion gap: 11 (ref 5–15)
BUN: 33 mg/dL — ABNORMAL HIGH (ref 8–23)
CO2: 15 mmol/L — ABNORMAL LOW (ref 22–32)
Calcium: 8.5 mg/dL — ABNORMAL LOW (ref 8.9–10.3)
Chloride: 112 mmol/L — ABNORMAL HIGH (ref 98–111)
Creatinine, Ser: 1.88 mg/dL — ABNORMAL HIGH (ref 0.61–1.24)
GFR, Estimated: 35 mL/min — ABNORMAL LOW (ref >60)
Glucose, Bld: 118 mg/dL — ABNORMAL HIGH (ref 70–99)
Potassium: 3.8 mmol/L (ref 3.5–5.1)
Sodium: 138 mmol/L (ref 135–145)

## 2021-11-20 LAB — AMMONIA: Ammonia: 15 umol/L (ref 9–35)

## 2021-11-20 SURGERY — ABDOMINAL AORTOGRAM W/LOWER EXTREMITY
Anesthesia: LOCAL

## 2021-11-20 MED ORDER — LORAZEPAM 2 MG/ML IJ SOLN
1.0000 mg | Freq: Four times a day (QID) | INTRAMUSCULAR | Status: DC | PRN
  Administered 2021-11-20 – 2021-11-21 (×3): 1 mg via INTRAVENOUS
  Filled 2021-11-20 (×3): qty 1

## 2021-11-20 NOTE — Progress Notes (Signed)
Speech Language Pathology Treatment:   Dysphagia Patient Details Name: Justin [name] MRN: [id number] DOB: 1935/01/10 Today's Date: 11/20/2021 Time: 1594-5859 SLP Time Calculation (min) (ACUTE ONLY): 16 min  Assessment / Plan / Recommendation Clinical Impression  Pt was seen for dysphagia treatment with his daughter present. Pt's daughter reported that the pt intermittently coughs with solids/liquids. Pt was adequately alert, but with worsened mentation compared to when he was last seen on 8/11. Pt did not follow commands or communicate verbally during the session. Pt's swallow function appears to be negatively impacted by his cognitive status. He demonstrated reduced bolus awareness. Oral holding was noted with puree; pt did not swallow puree despite prompts and cues, and suctioning was necessary to remove the bolus. Pt refused all other boluses by tightly pursing his lips and/or shaking his head when they were presented. Pt does not present as a candidate for safe oral intake at this time, and it is recommended that the pt's NPO status be maintained. Pt's MD was contacted regarding pt's performance, decline in swallow function, and possibly changing meds to IV if short-term non oral alimentation will be deferred. SLP will continue to follow pt.     HPI HPI: Pt is an 87 y.o. male who presented with worsening right leg and foot pain. CXR 8/11 completed due to fever: New focal consolidation of the right mid lung, concerning for infection. PMH: dementia, CKD 3B, hyperlipidemia, diabetes, neuropathy, history of CVA, CAD, hypertension, chronic pancreatitis, anemia, diabetic foot wound.      SLP Plan  Continue with current plan of care      Recommendations for follow up therapy are one component of a multi-disciplinary discharge planning process, led by the attending physician.  Recommendations may be updated based on patient status, additional functional criteria and insurance authorization.     Recommendations  Diet recommendations: NPO Medication Administration: Via alternative means                Oral Care Recommendations: Oral care QID Follow Up Recommendations:  (TBD) Assistance recommended at discharge: Frequent or constant Supervision/Assistance SLP Visit Diagnosis: Dysphagia, unspecified (R13.10) Plan: Continue with current plan of care         Kc Summerson I. Hardin Negus, [place], [place] Office number [phone]  [name]  11/20/2021, 11:53 AM

## 2021-11-20 NOTE — Progress Notes (Signed)
Patient with ongoing workup for altered mental status. Will delay angiography to allow workup to proceed and not confound issue with moderate sedation. Will check back in later and arrange for angiography mid- or late-week.  Justin [name]. Stanford Breed, MD [facility] Phone Number: [phone] 11/20/2021 10:40 AM

## 2021-11-20 NOTE — TOC Progression Note (Signed)
Transition of Care [facility]) - Progression Note    Patient Details  Name: Justin [name] MRN: [id number] Date of Birth: [birth year]  [facility]) CM/SW Contact  Reece Agar, [place] Phone Number: 11/20/2021, 2:26 PM  Clinical Narrative:    CSW continuing to follow for DC planning, pt is LTC at [facility] and will return when medically stable.    Expected Discharge Plan: Skilled Nursing Facility Barriers to Discharge: Continued Medical Work up  Expected Discharge Plan and Services Expected Discharge Plan: [place]                                               Social Determinants of Health (SDOH) Interventions    Readmission Risk Interventions     No data to display

## 2021-11-20 NOTE — Progress Notes (Signed)
Patient wound care done,patient responded to verbal commands,alert ,was able say his name.Will continue to monitor.

## 2021-11-20 NOTE — Care Management Important Message (Signed)
Important Message  Patient Details  Name: Justin [name] MRN: [id number] Date of Birth: 1934/07/22   Medicare Important Message Given:  Yes     Hannah Beat 11/20/2021, 11:49 AM

## 2021-11-20 NOTE — Progress Notes (Signed)
  Subjective:  Patient ID: Justin [name], male    DOB: March 19, 1935,  MRN: [id number]  Angio canceled for today given altered mental status.  Patient is awake but not answering questions.  Objective:   Today's Vitals   11/20/21 0455 11/20/21 0747 11/20/21 0942 11/20/21 1543  BP: (!) 173/83 133/72  131/76  Pulse: 83 80  82  Resp: '18 17  18  '$ Temp: 98.2 F (36.8 C) 97.7 F (36.5 C)  98 F (36.7 C)  TempSrc: Oral Oral  Oral  SpO2: 100% 100%  100%  PainSc:  0-No pain 0-No pain    There is no height or weight on file to calculate BMI.  General NAD-kept trying to roll over  Vascular Non palp pulses  Neurologic Appears intact.   Dermatologic Difficult to fully evaluate today as the patient did appear to be somewhat agitated and not able to fully change the dressing but, I can see ulceration still present dorsal right second PIPJ there is edema and hyperpigmentation to the toenail.  There is no drainage or pus.  No significant ulceration of the right hallux.  Orthopedic: Not able to appreciate any significant pain on exam.    Assessment & Plan:  Patient was evaluated and treated and all questions answered.  Right third toe ulcer, PAD -I attempted to treat the dressing today but he was trying out of the bed and I could not changes myself.  From what I can see though the wound appears to be stable. -Awaiting vascular invention.  Consider bone biopsy if needed pending intervention.  For now continue antibiotics. -Will continue to follow. Unclear that he will need amputation. Hopefully can heal with revascularization and wound care  Celesta Gentile, DPM

## 2021-11-20 NOTE — Progress Notes (Signed)
PROGRESS NOTE    Justin [name]  XYB:[id number] DOB: [birth year] DOA: 11/15/2021 PCP: [facility]    Brief Narrative:   Justin [name] is a 87 y.o. male with medical history significant of dementia, CKD 3B, hyperlipidemia, diabetes, neuropathy, history of CVA, CAD, hypertension, chronic pancreatitis, anemia, diabetic foot wound presenting with worsening right leg and foot pain.  History obtained with assistance of family and chart review due to patient's reported dementia.  Patient has been at rehab facility following a stroke in February.  Does have some residual right-sided deficits.  His right foot has been in pain for the past couple weeks per her daughter and has had some swelling in that period.  This has increased significantly over the past couple days with increased swelling overnight.  Does have known history of diabetic foot wounds per daughter.  Also found to have PNA. 8/13- AM patient less engaged and more confused, not following commands-- unable to get CT scan of brain as patient not cooperative   Assessment and Plan: Suspected pna -PRN O2 -pulmonary toilet -SLP eval done  Diabetic foot infection with ?Osteomyelitis > Patient presenting with redness warmth and swelling of the right lower extremity with wound at the base of the foot.  Imaging showing evidence of possible bone exposure, on exam warmth redness and swelling of right lower extremity especially foot, ulcerations on the toes. > ESR and CRP noted to be elevated in the ED in addition to the leukocytosis of 16.8. > Patient started on cefepime and Flagyl -MRI: Distal great toe ulceration with erosion of the distal phalangeal tuft and questionable minimal edema signal at the tip of the residual distal phalanx. There is largely preserved marrow signal. Findings could reflect very early osteomyelitis or reactive marrow change. No evidence of soft tissue abscess -podiatry following - MRSA screen +,  continue vancomycin for now - Follow-up blood cultures - Pain control as needed -WOC -Plan right lower extremity angiogram with possible intervention via left common femoral approach in cath lab when able   AMS/encephalopathy -? Cefepime toxicity -EEG shows: Though triphasic waves can be associated with cortical irritability at times, would favor an encephalopathic origin at this time.  This pattern can be seen with cefepime induced neurotoxicity as well as other metabolic encephalopathies -CT scan done and stable -blood sugar >100 -also stopped Neurontin -ativan PRN  Intertrigo > Noted of his groin while in the ED. - Start nystatin powder 3 times daily   Hyperlipidemia CAD History of CVA - Continue home atorvastatin and aspirin - Continue home isosorbide   Chronic pancreatitis - Continue home Creon  Diabetes type 2 with Diabetic neuropathy > 24 units daily and SSI at facility - decrease long acting - Continue home gabapentin   CKD 3B > Creatinine stable 1.88 in the ED - Trend renal function electrolytes   Dementia - delirium precautions   DVT prophylaxis: enoxaparin (LOVENOX) injection 30 mg Start: 11/16/21 1000 spoke with daughter/son 8/14   Code Status: DNR   Disposition Plan:  Level of care: Telemetry Medical Status is: inpt     Consultants:  Podiatry  vascular   Subjective: Moving in bed more-- not re-directable  Objective: Vitals:   11/19/21 1618 11/19/21 1954 11/20/21 0455 11/20/21 0747  BP: (!) 168/127 (!) 158/75 (!) 173/83 133/72  Pulse: 86 86 83 80  Resp: '17 16 18 17  ' Temp: 98.7 F (37.1 C) 97.7 F (36.5 C) 98.2 F (36.8 C) 97.7 F (36.5 C)  TempSrc:  Oral Oral Oral Oral  SpO2: 99% 100% 100% 100%    Intake/Output Summary (Last 24 hours) at 11/20/2021 1257 Last data filed at 11/20/2021 0932 Gross per 24 hour  Intake 2126.97 ml  Output 750 ml  Net 1376.97 ml   There were no vitals filed for this  visit.  Examination:    General: Appearance:    Well developed, well nourished male in no acute distress     Lungs:      respirations unlabored  Heart:    Normal heart rate.   MS:   All extremities are intact.   Neurologic:   Awake, not following commands       Data Reviewed: I have personally reviewed following labs and imaging studies  CBC: Recent Labs  Lab 11/15/21 1839 11/16/21 0352 11/17/21 0113 11/18/21 0054 11/20/21 0214  WBC 16.8* 16.3* 17.8* 15.0* 14.7*  NEUTROABS 12.5*  --   --   --   --   HGB 9.8* 9.9* 10.0* 9.7* 10.2*  HCT 29.6* 30.0* 30.4* 28.8* 30.1*  MCV 91.6 90.1 89.9 90.0 89.6  PLT 258 257 271 249 494   Basic Metabolic Panel: Recent Labs  Lab 11/16/21 0352 11/17/21 0113 11/18/21 0054 11/19/21 1020 11/20/21 0214  NA 132* 133* 135 137 138  K 3.5 3.7 3.8 3.9 3.8  CL 104 107 105 111 112*  CO2 21* 21* 21* 19* 15*  GLUCOSE 156* 73 131* 107* 118*  BUN 30* 29* 30* 33* 33*  CREATININE 1.83* 1.87* 1.91* 1.94* 1.88*  CALCIUM 8.6* 8.6* 8.6* 8.7* 8.5*   GFR: CrCl cannot be calculated (Unknown ideal weight.). Liver Function Tests: Recent Labs  Lab 11/15/21 1839 11/16/21 0352  AST 24 17  ALT 17 17  ALKPHOS 96 92  BILITOT 0.7 0.7  PROT 8.7* 8.7*  ALBUMIN 2.7* 2.5*   No results for input(s): "LIPASE", "AMYLASE" in the last 168 hours. Recent Labs  Lab 11/20/21 0228  AMMONIA 15   Coagulation Profile: No results for input(s): "INR", "PROTIME" in the last 168 hours. Cardiac Enzymes: No results for input(s): "CKTOTAL", "CKMB", "CKMBINDEX", "TROPONINI" in the last 168 hours. BNP (last 3 results) No results for input(s): "PROBNP" in the last 8760 hours. HbA1C: No results for input(s): "HGBA1C" in the last 72 hours. CBG: Recent Labs  Lab 11/19/21 1207 11/19/21 1621 11/19/21 2028 11/20/21 0746 11/20/21 1206  GLUCAP 116* 119* 105* 121* 140*   Lipid Profile: No results for input(s): "CHOL", "HDL", "LDLCALC", "TRIG", "CHOLHDL", "LDLDIRECT"  in the last 72 hours. Thyroid Function Tests: No results for input(s): "TSH", "T4TOTAL", "FREET4", "T3FREE", "THYROIDAB" in the last 72 hours. Anemia Panel: No results for input(s): "VITAMINB12", "FOLATE", "FERRITIN", "TIBC", "IRON", "RETICCTPCT" in the last 72 hours. Sepsis Labs: Recent Labs  Lab 11/15/21 1839  LATICACIDVEN 1.5    Recent Results (from the past 240 hour(s))  Blood culture (routine x 2)     Status: None   Collection Time: 11/15/21  6:52 PM   Specimen: BLOOD  Result Value Ref Range Status   Specimen Description BLOOD SITE NOT SPECIFIED  Final   Special Requests   Final    BOTTLES DRAWN AEROBIC ONLY Blood Culture adequate volume   Culture   Final    NO GROWTH 5 DAYS Performed at [facility], [address], [place], [place] [address]    Report Status 11/20/2021 FINAL  Final  Blood culture (routine x 2)     Status: None   Collection Time: 11/15/21  9:51  PM   Specimen: BLOOD RIGHT HAND  Result Value Ref Range Status   Specimen Description BLOOD RIGHT HAND  Final   Special Requests   Final    BOTTLES DRAWN AEROBIC AND ANAEROBIC Blood Culture adequate volume   Culture   Final    NO GROWTH 5 DAYS Performed at [facility], [address], [place], [place] [address]    Report Status 11/20/2021 FINAL  Final  MRSA Next Gen by PCR, Nasal     Status: Abnormal   Collection Time: 11/16/21  6:50 AM   Specimen: Nasal Mucosa; Nasal Swab  Result Value Ref Range Status   MRSA by PCR Next Gen DETECTED (A) NOT DETECTED Final    Comment: RESULT CALLED TO, READ BACK BY AND VERIFIED WITH:  11/16/21 8:43  RN CAMRYN COGAN (NOTE) The GeneXpert MRSA Assay (FDA approved for NASAL specimens only), is one component of a comprehensive MRSA colonization surveillance program. It is not intended to diagnose MRSA infection nor to guide or monitor treatment for MRSA infections. Test performance is not FDA approved in patients less than 74 years old. Performed at [facility], [place] [address], [place], [place] [address]          Radiology Studies: CT HEAD WO CONTRAST (5MM)  Result Date: 11/19/2021 CLINICAL DATA:  Initial evaluation for altered mental status. EXAM: CT HEAD WITHOUT CONTRAST TECHNIQUE: Contiguous axial images were obtained from the base of the skull through the vertex without intravenous contrast. RADIATION DOSE REDUCTION: This exam was performed according to the departmental dose-optimization program which includes automated exposure control, adjustment of the mA and/or kV according to patient size and/or use of iterative reconstruction technique. COMPARISON:  Prior MRI from 05/22/2021. FINDINGS: Brain: Generalized age-related cerebral atrophy. Patchy and confluent hypodensity involving the supratentorial cerebral white matter, most consistent with chronic small vessel ischemic disease, fairly advanced in nature. No acute intracranial hemorrhage. No acute large vessel territory infarct. No mass lesion, mass effect or midline shift. Mild ventricular prominence related global parenchymal volume loss of hydrocephalus. No extra-axial fluid collection. Vascular: No abnormal hyperdense vessel. Calcified atherosclerosis present at the skull base. Skull: Scalp soft tissues demonstrate no acute finding. Calvarium intact. Sinuses/Orbits: Globes orbital soft tissues demonstrate no acute finding. Prior ocular lens replacement on the right. Bilateral senescent calcifications noted. Moderate mucosal thickening throughout the paranasal sinuses with air-fluid level within the left maxillary sinus. Mastoid air cells are clear. Other: None. IMPRESSION: 1. No acute intracranial abnormality. 2. Generalized age-related cerebral atrophy with advanced chronic small vessel ischemic disease. 3. Moderate paranasal sinus disease with air-fluid level within the left maxillary sinus, suggesting acute sinusitis. Electronically Signed   By: Jeannine Boga M.D.   On: 11/19/2021  20:48   EEG adult  Result Date: 11/19/2021 Greta Doom, MD     11/19/2021  7:49 PM History: 87 year old male being evaluated for altered mental status Sedation: None Technique: This EEG was acquired with electrodes placed according to the International 10-20 electrode system (including Fp1, Fp2, F3, F4, C3, C4, P3, P4, O1, O2, T3, T4, T5, T6, A1, A2, Fz, Cz, Pz). The following electrodes were missing or displaced: none. Background: There are generalized periodic discharges with a frequency of 1 to 2 Hz with triphasic morphology.  In addition there is generalized irregular delta and theta activities throughout the recording.  There is no definite posterior dominant rhythm seen. Photic stimulation: Physiologic driving is now performed EEG Abnormalities: 1) generalized periodic discharges with  triphasic morphology 2) generalized irregular slow activity 3) absent posterior dominant rhythm Clinical Interpretation: This EEG is most consistent with a generalized nonspecific cerebral dysfunction (encephalopathy).  Though triphasic waves can be associated with cortical irritability at times, would favor an encephalopathic origin at this time.  This pattern can be seen with cefepime induced neurotoxicity as well as other metabolic encephalopathies. If there is a high concern for seizures, could consider long-term EEG monitoring, but encephalopathic pattern is favored at this time. Roland Rack, MD [facility] [phone] If 7pm- 7am, please page neurology on call as listed in [place].        Scheduled Meds:  atorvastatin  40 mg Oral Daily   Chlorhexidine Gluconate Cloth  6 each Topical Q0600   enoxaparin (LOVENOX) injection  30 mg Subcutaneous Daily   fluticasone furoate-vilanterol  1 puff Inhalation Daily   insulin aspart  0-5 Units Subcutaneous QHS   insulin aspart  0-9 Units Subcutaneous TID WC   lipase/protease/amylase  24,000 Units Oral TID AC   mupirocin ointment  1  Application Nasal BID   nystatin   Topical TID   pantoprazole (PROTONIX) IV  40 mg Intravenous Q24H   sodium chloride flush  3 mL Intravenous Q12H   Continuous Infusions:  sodium chloride 50 mL/hr at 11/19/21 1819   azithromycin Stopped (11/19/21 2337)   piperacillin-tazobactam (ZOSYN)  IV 3.375 g (11/20/21 0507)   vancomycin 1,750 mg (11/20/21 0001)     LOS: 4 days    Time spent: 45 minutes spent on chart review, discussion with nursing staff, consultants, updating family and interview/physical exam; more than 50% of that time was spent in counseling and/or coordination of care.    Geradine Girt, DO [facility] Available via Epic secure chat 7am-7pm After these hours, please refer to coverage provider listed on [email or web address] 11/20/2021, 12:57 PM

## 2021-11-21 DIAGNOSIS — L089 Local infection of the skin and subcutaneous tissue, unspecified: Secondary | ICD-10-CM | POA: Diagnosis not present

## 2021-11-21 DIAGNOSIS — I96 Gangrene, not elsewhere classified: Secondary | ICD-10-CM

## 2021-11-21 DIAGNOSIS — E11628 Type 2 diabetes mellitus with other skin complications: Secondary | ICD-10-CM | POA: Diagnosis not present

## 2021-11-21 LAB — GLUCOSE, CAPILLARY
Glucose-Capillary: 142 mg/dL — ABNORMAL HIGH (ref 70–99)
Glucose-Capillary: 162 mg/dL — ABNORMAL HIGH (ref 70–99)
Glucose-Capillary: 131 mg/dL — ABNORMAL HIGH (ref 70–99)
Glucose-Capillary: 154 mg/dL — ABNORMAL HIGH (ref 70–99)

## 2021-11-21 MED ORDER — HALOPERIDOL LACTATE 5 MG/ML IJ SOLN
1.0000 mg | Freq: Four times a day (QID) | INTRAMUSCULAR | Status: DC | PRN
Start: 2021-11-21 — End: 2021-11-28

## 2021-11-21 MED ORDER — MORPHINE SULFATE (PF) 2 MG/ML IV SOLN
1.0000 mg | INTRAVENOUS | Status: DC | PRN
  Administered 2021-11-21: 1 mg via INTRAVENOUS
  Filled 2021-11-21 (×2): qty 1

## 2021-11-21 NOTE — Progress Notes (Signed)
Have continued to update multiple members of the family- daughter, son, niece.   JV

## 2021-11-21 NOTE — Progress Notes (Signed)
SLP Cancellation Note  Patient Details Name: Justin [name] MRN: [id number] DOB: 18-Apr-1934   Cancelled treatment:       Reason Eval/Treat Not Completed: Other (comment) (pt remains lethargic at this time; will continue efforts) Kathleen Lime, MS [place] [facility] [phone] Pager [phone]   Macario Golds 11/21/2021, 8:48 AM

## 2021-11-21 NOTE — Progress Notes (Signed)
  Subjective:  Patient ID: Justin [name], male    DOB: 08-05-1934,  MRN: [id number]  Mental status somewhat improved today.  Family is at bedside.  Family is concerned about reoccurrence of pneumonia since he had a stroke earlier in the year as well as the circulation in his foot and wounds.  Objective:   Vitals:   11/21/21 0752 11/21/21 1700  BP: (!) 148/77 (!) 157/86  Pulse: 91 94  Resp: 18 17  Temp: 98.1 F (36.7 C) 98 F (36.7 C)  SpO2:  99%    General: NAD  Dermatological: Gangrenous changes present to the right second toe and ulcerations present in the interspaces.  There is hyperpigmentation of the dorsal forefoot as well and there is dry, peeling skin on the plantar aspect of the right foot.  Skin intact on the left foot.  No visible wounds noted on the toes today.       Vascular: Unable to palpate pulses.  Neruologic: Sensation decreased  Musculoskeletal: Tenderness diffuse on the right foot on exam.  Gait: Unassisted, Nonantalgic.    Assessment & Plan:  Patient was evaluated and treated and all questions answered.  Right third toe ulcer, PAD -Dressing the right foot changed today.  The family is at bedside.  Gangrenous changes present to the second toe which is worsened even compared to yesterday.  Concern about circulation however Angie has been canceled given mental status changes.  For now recommend continue broad-spectrum antibiotics and local wound care and await vascular surgery intervention if able to proceed. -I did discuss with the family possible palliative care consult.  They seem to be agreeable to this -Podiatry will continue to follow.  Celesta Gentile, DPM

## 2021-11-21 NOTE — Progress Notes (Addendum)
  Progress Note    11/21/2021 7:52 AM * No surgery date entered *  Subjective:  Patient is unable to answer any questions. He is awake but confused    Vitals:   11/21/21 0416 11/21/21 0752  BP: (!) 150/78 (!) 148/77  Pulse: 91 91  Resp: 17 18  Temp: 97.9 F (36.6 C) 98.1 F (36.7 C)  SpO2: 100%     Physical Exam: Extremities:  bilateral feet protected with boots. Stable R 3rd toe ulcer  CBC    Component Value Date/Time   WBC 14.7 (H) 11/20/2021 0214   RBC 3.36 (L) 11/20/2021 0214   HGB 10.2 (L) 11/20/2021 0214   HCT 30.1 (L) 11/20/2021 0214   PLT 259 11/20/2021 0214   MCV 89.6 11/20/2021 0214   MCV 89.5 12/31/2013 2117   MCH 30.4 11/20/2021 0214   MCHC 33.9 11/20/2021 0214   RDW 13.8 11/20/2021 0214   LYMPHSABS 2.2 11/15/2021 1839   MONOABS 1.4 (H) 11/15/2021 1839   EOSABS 0.4 11/15/2021 1839   BASOSABS 0.1 11/15/2021 1839    BMET    Component Value Date/Time   NA 138 11/20/2021 0214   K 3.8 11/20/2021 0214   CL 112 (H) 11/20/2021 0214   CO2 15 (L) 11/20/2021 0214   GLUCOSE 118 (H) 11/20/2021 0214   BUN 33 (H) 11/20/2021 0214   CREATININE 1.88 (H) 11/20/2021 0214   CREATININE 0.94 09/20/2015 0948   CALCIUM 8.5 (L) 11/20/2021 0214   GFRNONAA 35 (L) 11/20/2021 0214   GFRNONAA 79 09/15/2015 1117   GFRAA 56 (L) 08/31/2018 2017   GFRAA >89 09/15/2015 1117    INR    Component Value Date/Time   INR 1.1 05/21/2021 1817     Intake/Output Summary (Last 24 hours) at 11/21/2021 0752 Last data filed at 11/21/2021 1287 Gross per 24 hour  Intake 2278.46 ml  Output --  Net 2278.46 ml     Assessment/Plan:  87 y.o. male is here with R 3rd toe ulcer   -Patient is still confused, unable to answer any questions -Will continue to monitor -If AMS improves, will consider angio for later this week  Vicente Serene, PA-C [facility] [phone] 11/21/2021 7:52 AM  VASCULAR STAFF ADDENDUM: I have independently interviewed and examined the  patient. I agree with the above.  Not yet safe to do angiogram because of confusion. Patient still confused. Nursing tech placing mittens on my interview. Patient's daughter at bedside. I updated her. Foot appears stable.   [name], MD [facility] Phone Number: [phone] 11/21/2021 8:17 AM

## 2021-11-21 NOTE — Progress Notes (Signed)
PROGRESS NOTE    Justin [name]  SWH:[id number] DOB: [birth year] DOA: 11/15/2021 PCP: [facility]    Brief Narrative:   Justin [name] is a 87 y.o. male with medical history significant of dementia, CKD 3B, hyperlipidemia, diabetes, neuropathy, history of CVA, CAD, hypertension, chronic pancreatitis, anemia, diabetic foot wound presenting with worsening right leg and foot pain.  History obtained with assistance of family and chart review due to patient's reported dementia.  Patient has been at rehab facility following a stroke in February.  Does have some residual right-sided deficits.  His right foot has been in pain for the past couple weeks per her daughter and has had some swelling in that period.  This has increased significantly over the past couple days with increased swelling overnight.  Does have known history of diabetic foot wounds per daughter.  Also found to have PNA. 8/13- AM patient less engaged and more confused, not following commands 8/14- EEG suggestive of medication (cefepime) induced neurotoxicity 8/15- mental status slightly improved   Assessment and Plan: Suspected pna -PRN O2 -pulmonary toilet -SLP eval done -de-escalate abx as able  Diabetic foot infection with ?Osteomyelitis > Patient presenting with redness warmth and swelling of the right lower extremity with wound at the base of the foot.  Imaging showing evidence of possible bone exposure, on exam warmth redness and swelling of right lower extremity especially foot, ulcerations on the toes. > ESR and CRP noted to be elevated in the ED in addition to the leukocytosis of 16.8. > Patient started on cefepime and Flagyl -MRI: Distal great toe ulceration with erosion of the distal phalangeal tuft and questionable minimal edema signal at the tip of the residual distal phalanx. There is largely preserved marrow signal. Findings could reflect very early osteomyelitis or reactive marrow change. No  evidence of soft tissue abscess -podiatry following - MRSA screen +, continue vancomycin for now - Follow-up blood cultures - Pain control as needed -WOC -Plan right lower extremity angiogram with possible intervention via left common femoral approach in cath lab when mental status allows -de-escalate abx over next 24 hours?   AMS/encephalopathy -? Cefepime toxicity -EEG: Though triphasic waves can be associated with cortical irritability at times, would favor an encephalopathic origin at this time.  This pattern can be seen with cefepime induced neurotoxicity as well as other metabolic encephalopathies -CT scan done and stable -also stopped Neurontin -haldol PRN  Intertrigo > Noted of his groin while in the ED. - Start nystatin powder 3 times daily   Hyperlipidemia CAD History of CVA -resume PO meds when safe   Chronic pancreatitis - resume home Creon when able to take PO  Diabetes type 2 with Diabetic neuropathy > 24 units daily and SSI at facility - decrease long acting - hold gabapentin   CKD 3B > Creatinine stable 1.88 in the ED - Trend renal function electrolytes   Dementia - delirium precautions   DVT prophylaxis: enoxaparin (LOVENOX) injection 30 mg Start: 11/16/21 1000 spoke with daughter 8/15   Code Status: DNR   Disposition Plan:  Level of care: Telemetry Medical Status is: inpt     Consultants:  Podiatry  vascular   Subjective: Calmer today, not following commands  Objective: Vitals:   11/20/21 1543 11/20/21 2113 11/21/21 0416 11/21/21 0752  BP: 131/76 (!) 150/77 (!) 150/78 (!) 148/77  Pulse: 82 83 91 91  Resp: '18 17 17 18  ' Temp: 98 F (36.7 C)  97.9 F (36.6 C) 98.1  F (36.7 C)  TempSrc: Oral  Oral Oral  SpO2: 100% 99% 100%     Intake/Output Summary (Last 24 hours) at 11/21/2021 1323 Last data filed at 11/21/2021 1205 Gross per 24 hour  Intake 2025.46 ml  Output 550 ml  Net 1475.46 ml   There were no vitals filed for this  visit.  Examination:   General: Appearance:    elderly male in no acute distress     Lungs:     respirations unlabored  Heart:    Normal heart rate.   MS:   All extremities are intact.   Neurologic:   Awake, not following commands      Data Reviewed: I have personally reviewed following labs and imaging studies  CBC: Recent Labs  Lab 11/15/21 1839 11/16/21 0352 11/17/21 0113 11/18/21 0054 11/20/21 0214  WBC 16.8* 16.3* 17.8* 15.0* 14.7*  NEUTROABS 12.5*  --   --   --   --   HGB 9.8* 9.9* 10.0* 9.7* 10.2*  HCT 29.6* 30.0* 30.4* 28.8* 30.1*  MCV 91.6 90.1 89.9 90.0 89.6  PLT 258 257 271 249 128   Basic Metabolic Panel: Recent Labs  Lab 11/16/21 0352 11/17/21 0113 11/18/21 0054 11/19/21 1020 11/20/21 0214  NA 132* 133* 135 137 138  K 3.5 3.7 3.8 3.9 3.8  CL 104 107 105 111 112*  CO2 21* 21* 21* 19* 15*  GLUCOSE 156* 73 131* 107* 118*  BUN 30* 29* 30* 33* 33*  CREATININE 1.83* 1.87* 1.91* 1.94* 1.88*  CALCIUM 8.6* 8.6* 8.6* 8.7* 8.5*   GFR: CrCl cannot be calculated (Unknown ideal weight.). Liver Function Tests: Recent Labs  Lab 11/15/21 1839 11/16/21 0352  AST 24 17  ALT 17 17  ALKPHOS 96 92  BILITOT 0.7 0.7  PROT 8.7* 8.7*  ALBUMIN 2.7* 2.5*   No results for input(s): "LIPASE", "AMYLASE" in the last 168 hours. Recent Labs  Lab 11/20/21 0228  AMMONIA 15   Coagulation Profile: No results for input(s): "INR", "PROTIME" in the last 168 hours. Cardiac Enzymes: No results for input(s): "CKTOTAL", "CKMB", "CKMBINDEX", "TROPONINI" in the last 168 hours. BNP (last 3 results) No results for input(s): "PROBNP" in the last 8760 hours. HbA1C: No results for input(s): "HGBA1C" in the last 72 hours. CBG: Recent Labs  Lab 11/20/21 1206 11/20/21 1733 11/20/21 2117 11/21/21 0745 11/21/21 1157  GLUCAP 140* 97 123* 142* 162*   Lipid Profile: No results for input(s): "CHOL", "HDL", "LDLCALC", "TRIG", "CHOLHDL", "LDLDIRECT" in the last 72 hours. Thyroid  Function Tests: No results for input(s): "TSH", "T4TOTAL", "FREET4", "T3FREE", "THYROIDAB" in the last 72 hours. Anemia Panel: No results for input(s): "VITAMINB12", "FOLATE", "FERRITIN", "TIBC", "IRON", "RETICCTPCT" in the last 72 hours. Sepsis Labs: Recent Labs  Lab 11/15/21 1839  LATICACIDVEN 1.5    Recent Results (from the past 240 hour(s))  Blood culture (routine x 2)     Status: None   Collection Time: 11/15/21  6:52 PM   Specimen: BLOOD  Result Value Ref Range Status   Specimen Description BLOOD SITE NOT SPECIFIED  Final   Special Requests   Final    BOTTLES DRAWN AEROBIC ONLY Blood Culture adequate volume   Culture   Final    NO GROWTH 5 DAYS Performed at [facility], [address], [place], [place] [address]    Report Status 11/20/2021 FINAL  Final  Blood culture (routine x 2)     Status: None   Collection Time: 11/15/21  9:51 PM  Specimen: BLOOD RIGHT HAND  Result Value Ref Range Status   Specimen Description BLOOD RIGHT HAND  Final   Special Requests   Final    BOTTLES DRAWN AEROBIC AND ANAEROBIC Blood Culture adequate volume   Culture   Final    NO GROWTH 5 DAYS Performed at [facility], [address], [place], [place] [address]    Report Status 11/20/2021 FINAL  Final  MRSA Next Gen by PCR, Nasal     Status: Abnormal   Collection Time: 11/16/21  6:50 AM   Specimen: Nasal Mucosa; Nasal Swab  Result Value Ref Range Status   MRSA by PCR Next Gen DETECTED (A) NOT DETECTED Final    Comment: RESULT CALLED TO, READ BACK BY AND VERIFIED WITH:  11/16/21 8:43  RN CAMRYN COGAN (NOTE) The GeneXpert MRSA Assay (FDA approved for NASAL specimens only), is one component of a comprehensive MRSA colonization surveillance program. It is not intended to diagnose MRSA infection nor to guide or monitor treatment for MRSA infections. Test performance is not FDA approved in patients less than 63 years old. Performed at [facility], [place] [address],  [place], [place] [address]          Radiology Studies: CT HEAD WO CONTRAST (5MM)  Result Date: 11/19/2021 CLINICAL DATA:  Initial evaluation for altered mental status. EXAM: CT HEAD WITHOUT CONTRAST TECHNIQUE: Contiguous axial images were obtained from the base of the skull through the vertex without intravenous contrast. RADIATION DOSE REDUCTION: This exam was performed according to the departmental dose-optimization program which includes automated exposure control, adjustment of the mA and/or kV according to patient size and/or use of iterative reconstruction technique. COMPARISON:  Prior MRI from 05/22/2021. FINDINGS: Brain: Generalized age-related cerebral atrophy. Patchy and confluent hypodensity involving the supratentorial cerebral white matter, most consistent with chronic small vessel ischemic disease, fairly advanced in nature. No acute intracranial hemorrhage. No acute large vessel territory infarct. No mass lesion, mass effect or midline shift. Mild ventricular prominence related global parenchymal volume loss of hydrocephalus. No extra-axial fluid collection. Vascular: No abnormal hyperdense vessel. Calcified atherosclerosis present at the skull base. Skull: Scalp soft tissues demonstrate no acute finding. Calvarium intact. Sinuses/Orbits: Globes orbital soft tissues demonstrate no acute finding. Prior ocular lens replacement on the right. Bilateral senescent calcifications noted. Moderate mucosal thickening throughout the paranasal sinuses with air-fluid level within the left maxillary sinus. Mastoid air cells are clear. Other: None. IMPRESSION: 1. No acute intracranial abnormality. 2. Generalized age-related cerebral atrophy with advanced chronic small vessel ischemic disease. 3. Moderate paranasal sinus disease with air-fluid level within the left maxillary sinus, suggesting acute sinusitis. Electronically Signed   By: Jeannine Boga M.D.   On: 11/19/2021 20:48   EEG adult  Result  Date: 11/19/2021 Greta Doom, MD     11/19/2021  7:49 PM History: 87 year old male being evaluated for altered mental status Sedation: None Technique: This EEG was acquired with electrodes placed according to the International 10-20 electrode system (including Fp1, Fp2, F3, F4, C3, C4, P3, P4, O1, O2, T3, T4, T5, T6, A1, A2, Fz, Cz, Pz). The following electrodes were missing or displaced: none. Background: There are generalized periodic discharges with a frequency of 1 to 2 Hz with triphasic morphology.  In addition there is generalized irregular delta and theta activities throughout the recording.  There is no definite posterior dominant rhythm seen. Photic stimulation: Physiologic driving is now performed EEG Abnormalities: 1) generalized periodic discharges with triphasic morphology 2)  generalized irregular slow activity 3) absent posterior dominant rhythm Clinical Interpretation: This EEG is most consistent with a generalized nonspecific cerebral dysfunction (encephalopathy).  Though triphasic waves can be associated with cortical irritability at times, would favor an encephalopathic origin at this time.  This pattern can be seen with cefepime induced neurotoxicity as well as other metabolic encephalopathies. If there is a high concern for seizures, could consider long-term EEG monitoring, but encephalopathic pattern is favored at this time. Roland Rack, MD [facility] [phone] If 7pm- 7am, please page neurology on call as listed in [place].        Scheduled Meds:  Chlorhexidine Gluconate Cloth  6 each Topical Q0600   enoxaparin (LOVENOX) injection  30 mg Subcutaneous Daily   fluticasone furoate-vilanterol  1 puff Inhalation Daily   insulin aspart  0-5 Units Subcutaneous QHS   insulin aspart  0-9 Units Subcutaneous TID WC   mupirocin ointment  1 Application Nasal BID   nystatin   Topical TID   pantoprazole (PROTONIX) IV  40 mg Intravenous Q24H   sodium chloride  flush  3 mL Intravenous Q12H   Continuous Infusions:  sodium chloride 50 mL/hr at 11/21/21 0520   azithromycin Stopped (11/20/21 2334)   piperacillin-tazobactam (ZOSYN)  IV 3.375 g (11/21/21 0539)   vancomycin Stopped (11/20/21 0202)     LOS: 5 days    Time spent: 45 minutes spent on chart review, discussion with nursing staff, consultants, updating family and interview/physical exam; more than 50% of that time was spent in counseling and/or coordination of care.    Geradine Girt, DO [facility] Available via Epic secure chat 7am-7pm After these hours, please refer to coverage provider listed on [email or web address] 11/21/2021, 1:23 PM

## 2021-11-21 NOTE — Progress Notes (Signed)
Pharmacy Antibiotic Note  Justin [name] is a 87 y.o. male admitted on 11/15/2021 with  diabetic foot infection and possible pneumonia . Pharmacy has been consulted for vancomycin and Zosyn dosing.  WBC trending down to 14.7, afebrile, SCr 1.88.   Plan: - Continue vancomycin IV 1750 mg Q48H - Continue Zosyn IV 3.375 g Q8H (extended-infusion) - Continue azithromycin PO 500 mg daily per MD - Monitor clinical status and renal function - Obtain vancomycin peak and trough levels when indicated - De-escalate therapy when able    Temp (24hrs), Avg:98 F (36.7 C), Min:97.9 F (36.6 C), Max:98.1 F (36.7 C)  Recent Labs  Lab 11/15/21 1839 11/16/21 0352 11/17/21 0113 11/18/21 0054 11/19/21 1020 11/20/21 0214  WBC 16.8* 16.3* 17.8* 15.0*  --  14.7*  CREATININE 1.88* 1.83* 1.87* 1.91* 1.94* 1.88*  LATICACIDVEN 1.5  --   --   --   --   --      CrCl cannot be calculated (Unknown ideal weight.).    No Known Allergies  Antimicrobials this admission: Azithromycin 8/11 >> Cefepime 8/9 >> 8/13 (d/c'd due to neurotoxicity) Vancomycin 8/9 >> Metronidazole 8/9 >> 8/10 Zosyn 8/13 >>   Dose adjustments this admission: N/A  Microbiology results: 8/9 BCx: NGTD 8/10 MRSA PCR: positive  Thank you for allowing pharmacy to be a part of this patient's care.  Luisa Hart, PharmD, BCPS Clinical Pharmacist 11/21/2021 9:38 AM   Please refer to AMION for pharmacy phone numbers.

## 2021-11-22 DIAGNOSIS — L089 Local infection of the skin and subcutaneous tissue, unspecified: Secondary | ICD-10-CM | POA: Diagnosis not present

## 2021-11-22 DIAGNOSIS — E11628 Type 2 diabetes mellitus with other skin complications: Secondary | ICD-10-CM | POA: Diagnosis not present

## 2021-11-22 DIAGNOSIS — I96 Gangrene, not elsewhere classified: Secondary | ICD-10-CM

## 2021-11-22 LAB — CBC
HCT: 31.3 % — ABNORMAL LOW (ref 39.0–52.0)
Hemoglobin: 10.3 g/dL — ABNORMAL LOW (ref 13.0–17.0)
MCH: 29.9 pg (ref 26.0–34.0)
MCHC: 32.9 g/dL (ref 30.0–36.0)
MCV: 90.7 fL (ref 80.0–100.0)
Platelets: 329 10*3/uL (ref 150–400)
RBC: 3.45 MIL/uL — ABNORMAL LOW (ref 4.22–5.81)
RDW: 13.8 % (ref 11.5–15.5)
WBC: 16.6 10*3/uL — ABNORMAL HIGH (ref 4.0–10.5)
nRBC: 0 % (ref 0.0–0.2)

## 2021-11-22 LAB — BASIC METABOLIC PANEL
Anion gap: 6 (ref 5–15)
BUN: 26 mg/dL — ABNORMAL HIGH (ref 8–23)
CO2: 22 mmol/L (ref 22–32)
Calcium: 9.1 mg/dL (ref 8.9–10.3)
Chloride: 113 mmol/L — ABNORMAL HIGH (ref 98–111)
Creatinine, Ser: 1.96 mg/dL — ABNORMAL HIGH (ref 0.61–1.24)
GFR, Estimated: 33 mL/min — ABNORMAL LOW (ref >60)
Glucose, Bld: 163 mg/dL — ABNORMAL HIGH (ref 70–99)
Potassium: 4.1 mmol/L (ref 3.5–5.1)
Sodium: 141 mmol/L (ref 135–145)

## 2021-11-22 LAB — GLUCOSE, CAPILLARY
Glucose-Capillary: 168 mg/dL — ABNORMAL HIGH (ref 70–99)
Glucose-Capillary: 196 mg/dL — ABNORMAL HIGH (ref 70–99)
Glucose-Capillary: 161 mg/dL — ABNORMAL HIGH (ref 70–99)
Glucose-Capillary: 139 mg/dL — ABNORMAL HIGH (ref 70–99)

## 2021-11-22 LAB — VANCOMYCIN, PEAK: Vancomycin Pk: 51 ug/mL (ref 30–40)

## 2021-11-22 LAB — VANCOMYCIN, TROUGH: Vancomycin Tr: 31 ug/mL (ref 15–20)

## 2021-11-22 MED ORDER — ACETAMINOPHEN 500 MG PO TABS
1000.0000 mg | ORAL_TABLET | Freq: Four times a day (QID) | ORAL | Status: DC | PRN
Start: 2021-11-22 — End: 2021-11-28

## 2021-11-22 MED ORDER — OXYCODONE-ACETAMINOPHEN 5-325 MG PO TABS: 1.0000 | ORAL_TABLET | Freq: Four times a day (QID) | ORAL | Status: DC | PRN

## 2021-11-22 NOTE — Progress Notes (Signed)
PROGRESS NOTE   Justin [name]  WRU:[id number] DOB: Mar 27, 1935 DOA: 11/15/2021 PCP: [facility]  Brief Narrative:  87 year old white male known skilled facility resident Known CAD reflux DM TY 2 Prior stroke HLD HTN Chronic pancreatitis Subsequent ischemic stroke 12/22/7827 with metabolic encephalopathy at that admission and agitation--- he also had COVID-19 at that time  Brought to [facility] 11/15/2021 Tmax 104 as well as right foot swelling Found to have leukocytosis 16.8 normal lactic acid CRP 10 Right foot x-ray showed erosion of distal phalanx with exposed bone MRI was ordered Was also found to have a pneumonia  8/10-vascular as well as podiatry consulted-ABIs 8/10 showed moderate right lower extremity disease on right and mild disease on left 8/13  confused 8/14 EEG suggestive of cefepime induced neurotoxicity 8/15 mental status improved slightly   Hospital-Problem based course  Aspiration pneumonia in the setting of prior CVA 05/2021 Family relates several events of having this I have mentioned to family that this is probably more risky than the underlying osteomyelitis-speech therapy has seen the patient and placed on thin liquid diet Continue saline 50 cc/H Diabetic foot infection with possible osteomyelitis At this time continuing Zosyn every 8 vancomycin every 48 based on renal function--blood culture 8/9 negative although MRSA is positive on the screen He still maintains a leukocytosis of 15-17-probably will need definitive surgical management Minimize morphine-- placed on oxycodone instead to see if this helps with mental status--use Tylenol as first choice for pain at higher dose temporarily Await podiatry and vascular input-needs angiogram at some point later this admission may require second toe removal in addition Toxic metabolic encephalopathy?  Secondary to cefepime EEG showed triphasic waves?  Secondary to cefepime induced  neurotoxicity--encephalopathic pattern was felt to be more predominant per neurology CT scan stable Neurontin 400 at bedtime only Intertrigo of groin Started on nystatin which can continue Diabetes mellitus type 2 with neuropathy CBG ranging 1 60-200 on sliding scale coverage Would only use Neurontin at night Home dose of 24 units Lantus and sliding scale held at this time-only sliding scale for now HLD CAD prior CVA 05/2021 Holding the majority of p.o. meds can resume aspirin statin etc. when taking all meals CKD 3B Creatinine ranging between 1.8 and 1.9 which is close to baseline    DVT prophylaxis: Lovenox Code Status: Full Family Communication: None Disposition:  Status is: Inpatient Remains inpatient appropriate because:   Still encephalopathic and needs further work-up May require palliative input if no improvement in the next 48 to 72 hours   Consultants:  Vascular Podiatry  Procedures: Multiple  Antimicrobials: As above   Subjective:  More coherent according to her daughter [name] who is at the bedside-she states he was saying her name I am not able to get much out of them No chest pain I examined the wounds briefly  Objective: Vitals:   11/21/21 1700 11/21/21 2051 11/22/21 0508 11/22/21 0748  BP: (!) 157/86 (!) 157/96 (!) 157/83 (!) 172/90  Pulse: 94 93 95 (!) 103  Resp: '17 18 17 16  '$ Temp: 98 F (36.7 C)   98 F (36.7 C)  TempSrc: Oral   Oral  SpO2: 99% 100%  99%    Intake/Output Summary (Last 24 hours) at 11/22/2021 0750 Last data filed at 11/22/2021 0519 Gross per 24 hour  Intake 645.83 ml  Output 800 ml  Net -154.17 ml   There were no vitals filed for this visit.  Examination:  EOMI NCAT no focal deficit  CTA B no added sound rales rhonchi or wheeze ROM intact no focal deficit S1-S2 no murmur Abdomen soft no rebound no guarding  Data Reviewed: personally reviewed   CBC    Component Value Date/Time   WBC 16.6 (H) 11/22/2021 0159    RBC 3.45 (L) 11/22/2021 0159   HGB 10.3 (L) 11/22/2021 0159   HCT 31.3 (L) 11/22/2021 0159   PLT 329 11/22/2021 0159   MCV 90.7 11/22/2021 0159   MCV 89.5 12/31/2013 2117   MCH 29.9 11/22/2021 0159   MCHC 32.9 11/22/2021 0159   RDW 13.8 11/22/2021 0159   LYMPHSABS 2.2 11/15/2021 1839   MONOABS 1.4 (H) 11/15/2021 1839   EOSABS 0.4 11/15/2021 1839   BASOSABS 0.1 11/15/2021 1839      Latest Ref Rng & Units 11/22/2021    1:59 AM 11/20/2021    2:14 AM 11/19/2021   10:20 AM  CMP  Glucose 70 - 99 mg/dL 163  118  107   BUN 8 - 23 mg/dL 26  33  33   Creatinine 0.61 - 1.24 mg/dL 1.96  1.88  1.94   Sodium 135 - 145 mmol/L 141  138  137   Potassium 3.5 - 5.1 mmol/L 4.1  3.8  3.9   Chloride 98 - 111 mmol/L 113  112  111   CO2 22 - 32 mmol/L '22  15  19   '$ Calcium 8.9 - 10.3 mg/dL 9.1  8.5  8.7      Radiology Studies: No results found.   Scheduled Meds:  Chlorhexidine Gluconate Cloth  6 each Topical Q0600   enoxaparin (LOVENOX) injection  30 mg Subcutaneous Daily   fluticasone furoate-vilanterol  1 puff Inhalation Daily   insulin aspart  0-5 Units Subcutaneous QHS   insulin aspart  0-9 Units Subcutaneous TID WC   mupirocin ointment  1 Application Nasal BID   nystatin   Topical TID   pantoprazole (PROTONIX) IV  40 mg Intravenous Q24H   sodium chloride flush  3 mL Intravenous Q12H   Continuous Infusions:  sodium chloride 50 mL/hr at 11/21/21 0520   azithromycin Stopped (11/21/21 2217)   piperacillin-tazobactam (ZOSYN)  IV 3.375 g (11/22/21 0618)   vancomycin 1,750 mg (11/21/21 2306)     LOS: 6 days   Time spent: [place], MD [facility] To contact the attending provider between 7A-7P or the covering provider during after hours 7P-7A, please log into the web site [email or web address] and access using universal Gem password for that web site. If you do not have the password, please call the hospital operator.  11/22/2021, 7:50 AM

## 2021-11-22 NOTE — Progress Notes (Signed)
  Subjective:  Patient ID: Justin [name], male    DOB: July 25, 1934,  MRN: [id number]  Mental status somewhat improved today.He was able to brifely talk to me today, trying to say his name.     Objective:   Vitals:   11/22/21 2047 11/23/21 0435  BP: (!) 149/58 (!) 168/80  Pulse: 93 86  Resp: 17 16  Temp: 98.1 F (36.7 C) 97.8 F (36.6 C)  SpO2: 98% 98%     General: NAD  Dermatological: Gangrenous changes present to the right second toe.  There is no drainage or pus.  As able to change the bandaging of the left foot today.  No significant open lesions.  Mild macerated tissue in the interspaces.  No drainage or pus.  Vascular: Unable to palpate pulses.  Neruologic: Sensation decreased  Musculoskeletal:.  Mild diffuse tenderness but no specific area of tenderness noted.  Assessment & Plan:  Patient was evaluated and treated and all questions answered.  Right third toe ulcer, PAD -Otherwise he has a dressing left foot today.  I did not reapply the Xeroform from today's dressing change to help keep the interspaces clean and dry.  Continue daily dressing changes. -Continue offloading -Antibiotics -Continue local wound care for now.  Awaiting vascular surgery intervention once mental status improved. -Podiatry will continue to follow.  Celesta Gentile, DPM

## 2021-11-22 NOTE — Progress Notes (Signed)
Speech Language Pathology Treatment: Dysphagia  Patient Details Name: Justin [name] MRN: [id number] DOB: September 25, 1934 Today's Date: 11/22/2021 Time: 4782-9562 SLP Time Calculation (min) (ACUTE ONLY): 14 min  Assessment / Plan / Recommendation Clinical Impression  Pt was seen for dysphagia treatment with his daughter present who reported that the pt has been more cooperative than when he was last seen by this SLP on 8/14. Pt tolerated thin liquids via straw without overt s/s of aspiration. Oral holding was noted with purees. Pt did swallow with verbal prompts, but boluses remained in the anterior portion of the oral cavity and they were ultimately removed via oral suction. Puree boluses were ultimately removed via oral suction. Minimal prompts were necessary for pt to initially suck from a straw, but these were quickly faded. A clear liquid diet will be initiated at this time and SLP will continue to follow pt.    HPI HPI: Pt is an 87 y.o. male who presented with worsening right leg and foot pain. CXR 8/11 completed due to fever: New focal consolidation of the right mid lung, concerning for infection. PMH: dementia, CKD 3B, hyperlipidemia, diabetes, neuropathy, history of CVA, CAD, hypertension, chronic pancreatitis, anemia, diabetic foot wound.      SLP Plan  Continue with current plan of care      Recommendations for follow up therapy are one component of a multi-disciplinary discharge planning process, led by the attending physician.  Recommendations may be updated based on patient status, additional functional criteria and insurance authorization.    Recommendations  Diet recommendations: Thin liquid (clear liquid) Medication Administration: Via alternative means Supervision: Trained caregiver to feed patient Compensations: Slow rate;Follow solids with liquid;Minimize environmental distractions Postural Changes and/or Swallow Maneuvers: Seated upright 90 degrees                 Oral Care Recommendations: Oral care BID Follow Up Recommendations:  (TBD) Assistance recommended at discharge: Frequent or constant Supervision/Assistance SLP Visit Diagnosis: Dysphagia, unspecified (R13.10) Plan: Continue with current plan of care         Justin [name] Justin [name], [place], [place] Office number [phone]   Justin [name]  11/22/2021, 1:00 PM

## 2021-11-22 NOTE — Progress Notes (Addendum)
  Progress Note    11/22/2021 8:41 AM * No surgery date entered *  Subjective:  Awake but unable to follow commands   Vitals:   11/22/21 0508 11/22/21 0748  BP: (!) 157/83 (!) 172/90  Pulse: 95 (!) 103  Resp: 17 16  Temp:  98 F (36.7 C)  SpO2:  99%    Physical Exam: Extremities:  bilateral feet protected with boots. Stable R 3rd toe ulcer. R 2nd toe dry gangrene   CBC    Component Value Date/Time   WBC 16.6 (H) 11/22/2021 0159   RBC 3.45 (L) 11/22/2021 0159   HGB 10.3 (L) 11/22/2021 0159   HCT 31.3 (L) 11/22/2021 0159   PLT 329 11/22/2021 0159   MCV 90.7 11/22/2021 0159   MCV 89.5 12/31/2013 2117   MCH 29.9 11/22/2021 0159   MCHC 32.9 11/22/2021 0159   RDW 13.8 11/22/2021 0159   LYMPHSABS 2.2 11/15/2021 1839   MONOABS 1.4 (H) 11/15/2021 1839   EOSABS 0.4 11/15/2021 1839   BASOSABS 0.1 11/15/2021 1839    BMET    Component Value Date/Time   NA 141 11/22/2021 0159   K 4.1 11/22/2021 0159   CL 113 (H) 11/22/2021 0159   CO2 22 11/22/2021 0159   GLUCOSE 163 (H) 11/22/2021 0159   BUN 26 (H) 11/22/2021 0159   CREATININE 1.96 (H) 11/22/2021 0159   CREATININE 0.94 09/20/2015 0948   CALCIUM 9.1 11/22/2021 0159   GFRNONAA 33 (L) 11/22/2021 0159   GFRNONAA 79 09/15/2015 1117   GFRAA 56 (L) 08/31/2018 2017   GFRAA >89 09/15/2015 1117    INR    Component Value Date/Time   INR 1.1 05/21/2021 1817     Intake/Output Summary (Last 24 hours) at 11/22/2021 0841 Last data filed at 11/22/2021 0519 Gross per 24 hour  Intake 645.83 ml  Output 550 ml  Net 95.83 ml     Assessment/Plan:  87 y.o. male with R 3rd toe ulcer and 2nd toe dry gangrene   -Patient still not back to baseline mental status. Unable to follow commands -Continue broad spectrum abx and local wound care per podiatry, until able to pursue angiogram  Vicente Serene, PA-C [facility] [phone] 11/22/2021 8:41 AM  VASCULAR STAFF ADDENDUM: I have independently interviewed  and examined the patient. I agree with the above.   [name], MD [facility] Phone Number: [phone] 11/22/2021 3:34 PM

## 2021-11-22 NOTE — Progress Notes (Signed)
Pharmacy Antibiotic Note  Justin [name] is a 87 y.o. male admitted on 11/15/2021 with  diabetic foot infection and possible pneumonia . Pharmacy has been consulted for vancomycin and Zosyn dosing.  WBC 16.6, afebrile, SCr 1.96  Vancomycin levels: peak 51, trough 31 (this is not a trough as pt is on q48h dosing and this was 24h post last dose -appears this was ordered on wrong day)  Plan: - Continue vancomycin IV 1750 mg Q48H - Continue Zosyn IV 3.375 g Q8H (extended-infusion) - Reschedule vanc trough for 8/17 2100  - De-escalate therapy when able  Height: '6\' 2"'$  (188 cm) Weight: 72.3 kg (159 lb 6.3 oz) IBW/kg (Calculated) : 82.2  Temp (24hrs), Avg:97.9 F (36.6 C), Min:97.7 F (36.5 C), Max:98.1 F (36.7 C)  Recent Labs  Lab 11/16/21 0352 11/17/21 0113 11/18/21 0054 11/19/21 1020 11/20/21 0214 11/22/21 0159 11/22/21 2144  WBC 16.3* 17.8* 15.0*  --  14.7* 16.6*  --   CREATININE 1.83* 1.87* 1.91* 1.94* 1.88* 1.96*  --   VANCOTROUGH  --   --   --   --   --   --  31*  VANCOPEAK  --   --   --   --   --  51*  --      Estimated Creatinine Clearance: 28.2 mL/min (A) (by C-G formula based on SCr of 1.96 mg/dL (H)).    No Known Allergies  Antimicrobials this admission: Azithromycin 8/11 >>8/16 Cefepime 8/9 >> 8/13 (d/c'd due to neurotoxicity) Vancomycin 8/9 >> Metronidazole 8/9 >> 8/10 Zosyn 8/13 >>   Dose adjustments this admission: N/A  Microbiology results: 8/9 BCx: Negative 8/10 MRSA PCR: positive  Thank you for allowing pharmacy to be a part of this patient's care.  Sherlon Handing, PharmD, BCPS Please see amion for complete clinical pharmacist phone list 11/22/2021 11:27 PM

## 2021-11-22 NOTE — Progress Notes (Signed)
Date and time results received: 11/22/21 2305  Reported by: Gwendlyn Deutscher, Lab Test: Vancomycin Trough Critical Value: 31  Name of Provider Notified: Olena Heckle   Orders Received? Or Actions Taken?: MD notified, Pharm consult in place, continue to monitor

## 2021-11-23 DIAGNOSIS — E11628 Type 2 diabetes mellitus with other skin complications: Secondary | ICD-10-CM | POA: Diagnosis not present

## 2021-11-23 DIAGNOSIS — L089 Local infection of the skin and subcutaneous tissue, unspecified: Secondary | ICD-10-CM | POA: Diagnosis not present

## 2021-11-23 LAB — CBC WITH DIFFERENTIAL/PLATELET
Abs Immature Granulocytes: 0.2 10*3/uL — ABNORMAL HIGH (ref 0.00–0.07)
Basophils Absolute: 0.2 10*3/uL — ABNORMAL HIGH (ref 0.0–0.1)
Basophils Relative: 1 %
Eosinophils Absolute: 0.4 10*3/uL (ref 0.0–0.5)
Eosinophils Relative: 2 %
HCT: 31.7 % — ABNORMAL LOW (ref 39.0–52.0)
Hemoglobin: 10.5 g/dL — ABNORMAL LOW (ref 13.0–17.0)
Immature Granulocytes: 1 %
Lymphocytes Relative: 18 %
Lymphs Abs: 3 10*3/uL (ref 0.7–4.0)
MCH: 29.9 pg (ref 26.0–34.0)
MCHC: 33.1 g/dL (ref 30.0–36.0)
MCV: 90.3 fL (ref 80.0–100.0)
Monocytes Absolute: 1.4 10*3/uL — ABNORMAL HIGH (ref 0.1–1.0)
Monocytes Relative: 9 %
Neutro Abs: 11.7 10*3/uL — ABNORMAL HIGH (ref 1.7–7.7)
Neutrophils Relative %: 69 %
Platelets: 387 10*3/uL (ref 150–400)
RBC: 3.51 MIL/uL — ABNORMAL LOW (ref 4.22–5.81)
RDW: 14.1 % (ref 11.5–15.5)
WBC: 16.9 10*3/uL — ABNORMAL HIGH (ref 4.0–10.5)
nRBC: 0 % (ref 0.0–0.2)

## 2021-11-23 LAB — GLUCOSE, CAPILLARY
Glucose-Capillary: 182 mg/dL — ABNORMAL HIGH (ref 70–99)
Glucose-Capillary: 190 mg/dL — ABNORMAL HIGH (ref 70–99)
Glucose-Capillary: 358 mg/dL — ABNORMAL HIGH (ref 70–99)
Glucose-Capillary: 255 mg/dL — ABNORMAL HIGH (ref 70–99)

## 2021-11-23 LAB — RENAL FUNCTION PANEL
Albumin: 2.4 g/dL — ABNORMAL LOW (ref 3.5–5.0)
Anion gap: 11 (ref 5–15)
BUN: 25 mg/dL — ABNORMAL HIGH (ref 8–23)
CO2: 18 mmol/L — ABNORMAL LOW (ref 22–32)
Calcium: 9.2 mg/dL (ref 8.9–10.3)
Chloride: 114 mmol/L — ABNORMAL HIGH (ref 98–111)
Creatinine, Ser: 2.05 mg/dL — ABNORMAL HIGH (ref 0.61–1.24)
GFR, Estimated: 31 mL/min — ABNORMAL LOW (ref >60)
Glucose, Bld: 168 mg/dL — ABNORMAL HIGH (ref 70–99)
Phosphorus: 3 mg/dL (ref 2.5–4.6)
Potassium: 3.8 mmol/L (ref 3.5–5.1)
Sodium: 143 mmol/L (ref 135–145)

## 2021-11-23 MED ORDER — LINEZOLID 600 MG/300ML IV SOLN
600.0000 mg | Freq: Two times a day (BID) | INTRAVENOUS | Status: DC
  Administered 2021-11-24 – 2021-11-27 (×7): 600 mg via INTRAVENOUS
  Filled 2021-11-23 (×8): qty 300

## 2021-11-23 NOTE — Progress Notes (Signed)
Spoke with pt's daughter [name] via telephone. Updated her on plan of care with angiogram tomorrow. Daughter states she will be here later today to sign consent.

## 2021-11-23 NOTE — Progress Notes (Signed)
PROGRESS NOTE   Justin [name]  VZC:[id number] DOB: 16-Dec-1934 DOA: 11/15/2021 [facility]CP: [facility]  Brief Narrative:  87 year old white male known skilled facility resident Known CAD reflux DM TY 2 Prior stroke HLD HTN Chronic pancreatitis Subsequent ischemic stroke 05/06/7865 with metabolic encephalopathy at that admission and agitation--- he also had COVID-19 at that time  Brought to [facility] 11/15/2021 Tmax 104 as well as right foot swelling Found to have leukocytosis 16.8 normal lactic acid CRP 10 Right foot x-ray showed erosion of distal phalanx with exposed bone MRI was ordered Was also found to have a pneumonia  8/10-vascular as well as podiatry consulted-ABIs 8/10 showed moderate right lower extremity disease on right and mild disease on left 8/13  confused 8/14 EEG suggestive of cefepime induced neurotoxicity 8/15 mental status improved slightly   Hospital-Problem based course  Aspiration pneumonia in the setting of prior CVA 05/2021 Family relates several events of having this I have mentioned to family that this is probably more risky than the underlying osteomyelitis Given he is more alert today have graduated him to dysphagia 2-we will see how things go Continue saline 50 cc/H Diabetic foot infection with possible osteomyelitis Continue Zosyn but change vancomycin to linezolid given rising creatinine Oxycodone preferentially to IV pain meds and Tylenol Angiogram planned for 6/72 Toxic metabolic encephalopathy?  Secondary to cefepime EEG showed triphasic waves?  Secondary to cefepime induced neurotoxicity--encephalopathic pattern was felt to be more predominant per neurology CT scan stable Neurontin 400 at bedtime only Intertrigo of groin Started on nystatin which can continue Diabetes mellitus type 2 with neuropathy CBG ranging between 182 and 190 Would only use Neurontin at night Home dose of 24 units Lantus and sliding scale held at this  time-only sliding scale for now HLD CAD prior CVA 05/2021 Holding the majority of p.o. meds can resume aspirin statin etc. when taking all meals CKD 3B Creatinine ranging between 1.8 and 1.9 which is close to baseline    DVT prophylaxis: Lovenox Code Status: Full Family Communication: None Disposition:  Status is: Inpatient Remains inpatient appropriate because:   Still encephalopathic and needs further work-up May require palliative input if no improvement in the next 48 to 72 hours   Consultants:  Vascular Podiatry  Procedures: Multiple  Antimicrobials: As above   Subjective:  More coherent but confused thinks that he is in [place]? No chest pain Wounds not examined Objective: Vitals:   11/22/21 2047 11/23/21 0435 11/23/21 0816 11/23/21 0847  BP: (!) 149/58 (!) 168/80  (!) 152/84  Pulse: 93 86 85 88  Resp: '17 16 16 17  '$ Temp: 98.1 F (36.7 C) 97.8 F (36.6 C)  97.9 F (36.6 C)  TempSrc: Oral Oral  Axillary  SpO2: 98% 98% 99% 100%  Weight:      Height:        Intake/Output Summary (Last 24 hours) at 11/23/2021 1626 Last data filed at 11/23/2021 0850 Gross per 24 hour  Intake 523.26 ml  Output --  Net 523.26 ml    Filed Weights   11/22/21 0818  Weight: 72.3 kg    Examination:  EOMI NCAT no focal deficit  CTA B no added sound rales rhonchi or wheeze ROM intact no focal deficit Lower extremity wounds not examined Neurologically intact No focal deficit  Data Reviewed: personally reviewed   CBC    Component Value Date/Time   WBC 16.9 (H) 11/23/2021 0116   RBC 3.51 (L) 11/23/2021 0116   HGB 10.5 (L)  11/23/2021 0116   HCT 31.7 (L) 11/23/2021 0116   PLT 387 11/23/2021 0116   MCV 90.3 11/23/2021 0116   MCV 89.5 12/31/2013 2117   MCH 29.9 11/23/2021 0116   MCHC 33.1 11/23/2021 0116   RDW 14.1 11/23/2021 0116   LYMPHSABS 3.0 11/23/2021 0116   MONOABS 1.4 (H) 11/23/2021 0116   EOSABS 0.4 11/23/2021 0116   BASOSABS 0.2 (H) 11/23/2021 0116       Latest Ref Rng & Units 11/23/2021    1:16 AM 11/22/2021    1:59 AM 11/20/2021    2:14 AM  CMP  Glucose 70 - 99 mg/dL 168  163  118   BUN 8 - 23 mg/dL 25  26  33   Creatinine 0.61 - 1.24 mg/dL 2.05  1.96  1.88   Sodium 135 - 145 mmol/L 143  141  138   Potassium 3.5 - 5.1 mmol/L 3.8  4.1  3.8   Chloride 98 - 111 mmol/L 114  113  112   CO2 22 - 32 mmol/L '18  22  15   '$ Calcium 8.9 - 10.3 mg/dL 9.2  9.1  8.5      Radiology Studies: No results found.   Scheduled Meds:  Chlorhexidine Gluconate Cloth  6 each Topical Q0600   enoxaparin (LOVENOX) injection  30 mg Subcutaneous Daily   fluticasone furoate-vilanterol  1 puff Inhalation Daily   insulin aspart  0-5 Units Subcutaneous QHS   insulin aspart  0-9 Units Subcutaneous TID WC   mupirocin ointment  1 Application Nasal BID   nystatin   Topical TID   pantoprazole (PROTONIX) IV  40 mg Intravenous Q24H   sodium chloride flush  3 mL Intravenous Q12H   Continuous Infusions:  sodium chloride 50 mL/hr at 11/21/21 0520   [START ON 11/24/2021] linezolid (ZYVOX) IV     piperacillin-tazobactam (ZOSYN)  IV 3.375 g (11/23/21 0502)     LOS: 7 days   Time spent: [place], MD [facility] To contact the attending provider between 7A-7P or the covering provider during after hours 7P-7A, please log into the web site [email or web address] and access using universal [place] password for that web site. If you do not have the password, please call the hospital operator.  11/23/2021, 4:26 PM

## 2021-11-23 NOTE — Progress Notes (Signed)
   PODIATRY PROGRESS NOTE Vascular planning for angiogram tomorrow.  Continue local wound care for now.  Podiatry will follow-up over the weekend  Edrick Kins, DPM [facility]  Dr. Edrick Kins, DPM    [address], [place] [address]                Office [phone]  Fax [phone]

## 2021-11-23 NOTE — Progress Notes (Addendum)
  Progress Note    11/23/2021 7:57 AM * No surgery date entered *  Subjective: Patient is alert and oriented.  He knows who he is.  He denies any pain in his right foot.  He is still agreeable to angiogram   Vitals:   11/22/21 2047 11/23/21 0435  BP: (!) 149/58 (!) 168/80  Pulse: 93 86  Resp: 17 16  Temp: 98.1 F (36.7 C) 97.8 F (36.6 C)  SpO2: 98% 98%    Physical Exam: Lungs: Nonlabored Extremities: Right second toe gangrene CBC    Component Value Date/Time   WBC 16.9 (H) 11/23/2021 0116   RBC 3.51 (L) 11/23/2021 0116   HGB 10.5 (L) 11/23/2021 0116   HCT 31.7 (L) 11/23/2021 0116   PLT 387 11/23/2021 0116   MCV 90.3 11/23/2021 0116   MCV 89.5 12/31/2013 2117   MCH 29.9 11/23/2021 0116   MCHC 33.1 11/23/2021 0116   RDW 14.1 11/23/2021 0116   LYMPHSABS 3.0 11/23/2021 0116   MONOABS 1.4 (H) 11/23/2021 0116   EOSABS 0.4 11/23/2021 0116   BASOSABS 0.2 (H) 11/23/2021 0116    BMET    Component Value Date/Time   NA 143 11/23/2021 0116   K 3.8 11/23/2021 0116   CL 114 (H) 11/23/2021 0116   CO2 18 (L) 11/23/2021 0116   GLUCOSE 168 (H) 11/23/2021 0116   BUN 25 (H) 11/23/2021 0116   CREATININE 2.05 (H) 11/23/2021 0116   CREATININE 0.94 09/20/2015 0948   CALCIUM 9.2 11/23/2021 0116   GFRNONAA 31 (L) 11/23/2021 0116   GFRNONAA 79 09/15/2015 1117   GFRAA 56 (L) 08/31/2018 2017   GFRAA >89 09/15/2015 1117    INR    Component Value Date/Time   INR 1.1 05/21/2021 1817     Intake/Output Summary (Last 24 hours) at 11/23/2021 0757 Last data filed at 11/23/2021 0300 Gross per 24 hour  Intake 573.45 ml  Output --  Net 573.45 ml     Assessment/Plan:  87 y.o. male is here with right second toe gangrene and third toe ulcer   -Patient is alert and oriented and still agreeable to angiogram -We will place patient n.p.o. after midnight -Angiogram tomorrow  Vicente Serene, PA-C [facility] [phone] 11/23/2021 7:57 AM  VASCULAR STAFF  ADDENDUM: I have independently interviewed and examined the patient. I agree with the above.  Reviewed risk / benefit / alternative with patient and daughter today at bedside. Plan angiogram tomorrow with me. NPO after midnight.   [name], MD [facility] Phone Number: [phone] 11/23/2021 1:00 PM

## 2021-11-23 NOTE — Progress Notes (Signed)
Speech Language Pathology Treatment: Dysphagia  Patient Details Name: Justin [name] MRN: [id number] DOB: 07-19-1934 Today's Date: 11/23/2021 Time: 1150-1203 SLP Time Calculation (min) (ACUTE ONLY): 13 min  Assessment / Plan / Recommendation Clinical Impression  Pt was seen for dysphagia treatrment. Pt's mentation has improved further and he was able to participate briefly in conversation during the session. Pt's overall bolus awareness was notably improved compared to yesterday. Pt consumed purees, dysphagia 3, mixed consistency boluses, regular texture solids, and thin liquids via straw without overt s/s of aspiration. No cues were necessary for bolus manipulation/mastication on this date and oral clearance was functional, but mastication was prolonged with regular and dysphagia 3 solids. A dysphagia 2 diet with thin liquids will be initiated at this time and SLP will continue to follow pt.     HPI HPI: Pt is an 87 y.o. male who presented with worsening right leg and foot pain. CXR 8/11 completed due to fever: New focal consolidation of the right mid lung, concerning for infection. PMH: dementia, CKD 3B, hyperlipidemia, diabetes, neuropathy, history of CVA, CAD, hypertension, chronic pancreatitis, anemia, diabetic foot wound.      SLP Plan  Continue with current plan of care      Recommendations for follow up therapy are one component of a multi-disciplinary discharge planning process, led by the attending physician.  Recommendations may be updated based on patient status, additional functional criteria and insurance authorization.    Recommendations  Diet recommendations: Dysphagia 2 (fine chop);Thin liquid Liquids provided via: Cup;Straw Medication Administration: Whole meds with puree Supervision: Trained caregiver to feed patient Compensations: Slow rate;Follow solids with liquid;Minimize environmental distractions Postural Changes and/or Swallow Maneuvers: Seated upright 90  degrees                Oral Care Recommendations: Oral care BID Follow Up Recommendations:  (TBD) Assistance recommended at discharge: Frequent or constant Supervision/Assistance SLP Visit Diagnosis: Dysphagia, unspecified (R13.10) Plan: Continue with current plan of care         Justin [name] I. Justin [name], Justin [name], Justin [name] Office number [phone]   Justin [name]  11/23/2021, 12:44 PM

## 2021-11-24 ENCOUNTER — Inpatient Hospital Stay (HOSPITAL_COMMUNITY)
Admission: EM | Disposition: A | Discharge status: Skilled Nursing Facility | Payer: Self-pay | Source: Skilled Nursing Facility | Attending: Family Medicine

## 2021-11-24 ENCOUNTER — Ambulatory Visit (HOSPITAL_COMMUNITY): Admit: 2021-11-24 | Payer: Medicare Other | Admitting: Vascular Surgery

## 2021-11-24 DIAGNOSIS — L089 Local infection of the skin and subcutaneous tissue, unspecified: Secondary | ICD-10-CM | POA: Diagnosis not present

## 2021-11-24 DIAGNOSIS — I70261 Atherosclerosis of native arteries of extremities with gangrene, right leg: Secondary | ICD-10-CM

## 2021-11-24 DIAGNOSIS — E11628 Type 2 diabetes mellitus with other skin complications: Secondary | ICD-10-CM | POA: Diagnosis not present

## 2021-11-24 HISTORY — PX: PERIPHERAL VASCULAR BALLOON ANGIOPLASTY: CATH118281

## 2021-11-24 HISTORY — PX: ABDOMINAL AORTOGRAM W/LOWER EXTREMITY: CATH118223

## 2021-11-24 LAB — BASIC METABOLIC PANEL
Anion gap: 9 (ref 5–15)
BUN: 23 mg/dL (ref 8–23)
CO2: 19 mmol/L — ABNORMAL LOW (ref 22–32)
Calcium: 8.9 mg/dL (ref 8.9–10.3)
Chloride: 111 mmol/L (ref 98–111)
Creatinine, Ser: 1.91 mg/dL — ABNORMAL HIGH (ref 0.61–1.24)
GFR, Estimated: 34 mL/min — ABNORMAL LOW (ref >60)
Glucose, Bld: 220 mg/dL — ABNORMAL HIGH (ref 70–99)
Potassium: 3.9 mmol/L (ref 3.5–5.1)
Sodium: 139 mmol/L (ref 135–145)

## 2021-11-24 LAB — GLUCOSE, CAPILLARY
Glucose-Capillary: 239 mg/dL — ABNORMAL HIGH (ref 70–99)
Glucose-Capillary: 190 mg/dL — ABNORMAL HIGH (ref 70–99)
Glucose-Capillary: 211 mg/dL — ABNORMAL HIGH (ref 70–99)
Glucose-Capillary: 341 mg/dL — ABNORMAL HIGH (ref 70–99)

## 2021-11-24 LAB — CBC
HCT: 34.5 % — ABNORMAL LOW (ref 39.0–52.0)
Hemoglobin: 11.2 g/dL — ABNORMAL LOW (ref 13.0–17.0)
MCH: 29.2 pg (ref 26.0–34.0)
MCHC: 32.5 g/dL (ref 30.0–36.0)
MCV: 90.1 fL (ref 80.0–100.0)
Platelets: 405 10*3/uL — ABNORMAL HIGH (ref 150–400)
RBC: 3.83 MIL/uL — ABNORMAL LOW (ref 4.22–5.81)
RDW: 14 % (ref 11.5–15.5)
WBC: 15.2 10*3/uL — ABNORMAL HIGH (ref 4.0–10.5)
nRBC: 0 % (ref 0.0–0.2)

## 2021-11-24 SURGERY — ABDOMINAL AORTOGRAM W/LOWER EXTREMITY
Anesthesia: LOCAL | Laterality: Right

## 2021-11-24 MED ORDER — LIDOCAINE HCL (PF) 1 % IJ SOLN
INTRAMUSCULAR | Status: AC
  Filled 2021-11-24: qty 30

## 2021-11-24 MED ORDER — HEPARIN (PORCINE) IN NACL 1000-0.9 UT/500ML-% IV SOLN
INTRAVENOUS | Status: DC | PRN
  Administered 2021-11-24: 500 mL

## 2021-11-24 MED ORDER — SODIUM CHLORIDE 0.9 % IV SOLN
250.0000 mL | INTRAVENOUS | Status: DC | PRN
Start: 2021-11-25 — End: 2021-11-28

## 2021-11-24 MED ORDER — IODIXANOL 320 MG/ML IV SOLN
INTRAVENOUS | Status: DC | PRN
  Administered 2021-11-24: 70 mL

## 2021-11-24 MED ORDER — SODIUM CHLORIDE 0.9% FLUSH
3.0000 mL | INTRAVENOUS | Status: DC | PRN
Start: 2021-11-25 — End: 2021-11-28

## 2021-11-24 MED ORDER — CLOPIDOGREL BISULFATE 75 MG PO TABS
75.0000 mg | ORAL_TABLET | Freq: Every day | ORAL | Status: DC
  Administered 2021-11-25 – 2021-11-28 (×4): 75 mg via ORAL
  Filled 2021-11-24 (×4): qty 1

## 2021-11-24 MED ORDER — SODIUM CHLORIDE 0.9 % WEIGHT BASED INFUSION
1.0000 mL/kg/h | INTRAVENOUS | Status: AC
  Administered 2021-11-24: 1 mL/kg/h via INTRAVENOUS

## 2021-11-24 MED ORDER — ASPIRIN 81 MG PO CHEW
CHEWABLE_TABLET | ORAL | Status: AC
  Filled 2021-11-24: qty 1

## 2021-11-24 MED ORDER — SODIUM CHLORIDE 0.9% FLUSH
3.0000 mL | Freq: Two times a day (BID) | INTRAVENOUS | Status: DC
  Administered 2021-11-25 – 2021-11-27 (×4): 3 mL via INTRAVENOUS

## 2021-11-24 MED ORDER — LIDOCAINE HCL (PF) 1 % IJ SOLN
INTRAMUSCULAR | Status: DC | PRN
  Administered 2021-11-24: 15 mL

## 2021-11-24 MED ORDER — HEPARIN (PORCINE) IN NACL 1000-0.9 UT/500ML-% IV SOLN
INTRAVENOUS | Status: AC
  Filled 2021-11-24: qty 1000

## 2021-11-24 MED ORDER — OXYCODONE HCL 5 MG PO TABS: 5.0000 mg | ORAL_TABLET | ORAL | Status: DC | PRN

## 2021-11-24 MED ORDER — HEPARIN SODIUM (PORCINE) 1000 UNIT/ML IJ SOLN
INTRAMUSCULAR | Status: AC
  Filled 2021-11-24: qty 10

## 2021-11-24 MED ORDER — FENTANYL CITRATE (PF) 100 MCG/2ML IJ SOLN
INTRAMUSCULAR | Status: AC
  Filled 2021-11-24: qty 2

## 2021-11-24 MED ORDER — LABETALOL HCL 5 MG/ML IV SOLN: 10.0000 mg | INTRAVENOUS | Status: DC | PRN

## 2021-11-24 MED ORDER — FENTANYL CITRATE (PF) 100 MCG/2ML IJ SOLN
INTRAMUSCULAR | Status: DC | PRN
  Administered 2021-11-24: 25 ug via INTRAVENOUS

## 2021-11-24 MED ORDER — ASPIRIN 81 MG PO TBEC
81.0000 mg | DELAYED_RELEASE_TABLET | Freq: Every day | ORAL | Status: DC
  Administered 2021-11-24 – 2021-11-28 (×5): 81 mg via ORAL
  Filled 2021-11-24 (×4): qty 1

## 2021-11-24 MED ORDER — ONDANSETRON HCL 4 MG/2ML IJ SOLN: 4.0000 mg | Freq: Four times a day (QID) | INTRAMUSCULAR | Status: DC | PRN

## 2021-11-24 SURGICAL SUPPLY — 20 items
BALLN IN.PACT DCB 4X60 (BALLOONS) ×1
BALLN STERLING OTW 3X220X150 (BALLOONS) ×1
BALLOON STERLING OTW 3X220X150 (BALLOONS) IMPLANT
CATH CROSS OVER TEMPO 5F (CATHETERS) IMPLANT
CATH OMNI FLUSH 5F 65CM (CATHETERS) IMPLANT
CLOSURE PERCLOSE PROSTYLE (VASCULAR PRODUCTS) IMPLANT
DCB IN.PACT 4X60 (BALLOONS) IMPLANT
DEVICE TORQUE H2O (MISCELLANEOUS) IMPLANT
GLIDEWIRE ADV .035X260CM (WIRE) IMPLANT
GUIDEWIRE ANGLED .035X150CM (WIRE) IMPLANT
KIT ANGIASSIST CO2 SYSTEM (KITS) IMPLANT
KIT MICROPUNCTURE NIT STIFF (SHEATH) IMPLANT
KIT PV (KITS) ×1 IMPLANT
SHEATH PINNACLE 5F 10CM (SHEATH) IMPLANT
SHEATH PINNACLE ST 6F 45CM (SHEATH) IMPLANT
SHEATH PROBE COVER 6X72 (BAG) IMPLANT
TRANSDUCER W/STOPCOCK (MISCELLANEOUS) ×1 IMPLANT
TRAY PV CATH (CUSTOM PROCEDURE TRAY) ×1 IMPLANT
WIRE BENTSON .035X145CM (WIRE) IMPLANT
WIRE G V18X300CM (WIRE) IMPLANT

## 2021-11-24 NOTE — Progress Notes (Signed)
Pt kept NPO past midnight for angiogram today.

## 2021-11-24 NOTE — Progress Notes (Signed)
PROGRESS NOTE   Justin [name]  PZW:[id number] DOB: [birth year] DOA: 11/15/2021 [facility]: [facility]  Brief Narrative:  87 year old white male known skilled facility resident Known CAD reflux DM TY 2 Prior stroke HLD HTN Chronic pancreatitis Subsequent ischemic stroke 07/31/5359 with metabolic encephalopathy at that admission and agitation--- he also had COVID-19 at that time  Brought to [facility] 11/15/2021 Tmax 104 as well as right foot swelling Found to have leukocytosis 16.8 normal lactic acid CRP 10 Right foot x-ray showed erosion of distal phalanx with exposed bone MRI was ordered Was also found to have a pneumonia  8/10-vascular as well as podiatry consulted-ABIs 8/10 showed moderate right lower extremity disease on right and mild disease on left 8/13  confused 8/14 EEG suggestive of cefepime induced neurotoxicity 8/15 mental status improved slightly 8/17 Diet graduated upwards--Mental status significantly improving   Hospital-Problem based course  Aspiration pneumonia in the setting of prior CVA 05/2021 he is more alert today have graduated him to dysphagia 2 and he is tolerating this fair Continue saline 50 cc/H Diabetic foot infection with possible osteomyelitis Continue Zosyn -- vancomycin -->linezolid 2/2 ^ cre Oxycodone preferentially to IV pain meds and Tylenol Angiogram planned for 8/18 May require amputation of R second toe still Toxic metabolic encephalopathy?  Secondary to cefepime EEG showed triphasic waves?  Secondary to cefepime induced neurotoxicity--encephalopathic pattern was felt to be more predominant per neurology HE is improving CT scan stable Neurontin 400 at bedtime only Intertrigo of groin Started on nystatin which can continue Diabetes mellitus type 2 with neuropathy CBG ranging between 190-250 Would only use Neurontin at night Home dose of 24 units Lantus and sliding scale held at this time-only sliding scale for now May need  to add long acting if significantly above 200 range in am HLD CAD prior CVA 05/2021 Holding the majority of p.o. meds can resume aspirin statin etc. when taking all meals CKD 3B Creatinine ranging between 1.8 and 1.9 which is close to baseline    DVT prophylaxis: Lovenox Code Status: Full Family Communication: None Disposition:  Status is: Inpatient Remains inpatient appropriate because:   Still encephalopathic and needs further work-up May require palliative input if no improvement in the next 48 to 72 hours   Consultants:  Vascular Podiatry  Procedures: Multiple  Antimicrobials: As above   Subjective:  More coherent  Recognized multiple family members No issues apparently with new diet orders No cp  Objective: Vitals:   11/23/21 1716 11/24/21 0412 11/24/21 0811 11/24/21 0906  BP: (!) 168/83 127/70 137/71   Pulse: 84 74 73   Resp: '17 17 17   '$ Temp: 98.2 F (36.8 C) 98 F (36.7 C) 97.9 F (36.6 C)   TempSrc: Oral Oral Axillary   SpO2: 100% 97% 100% 100%  Weight:      Height:        Intake/Output Summary (Last 24 hours) at 11/24/2021 1511 Last data filed at 11/24/2021 1405 Gross per 24 hour  Intake 240 ml  Output 200 ml  Net 40 ml    Filed Weights   11/22/21 0818  Weight: 72.3 kg    Examination:  EOMI NCAT no focal deficit  CTA B no added sound rales rhonchi or wheeze ROM intact no focal deficit Lower extremity wounds as below     Data Reviewed: personally reviewed   CBC    Component Value Date/Time   WBC 16.9 (H) 11/23/2021 0116   RBC 3.51 (L) 11/23/2021 0116  HGB 10.5 (L) 11/23/2021 0116   HCT 31.7 (L) 11/23/2021 0116   PLT 387 11/23/2021 0116   MCV 90.3 11/23/2021 0116   MCV 89.5 12/31/2013 2117   MCH 29.9 11/23/2021 0116   MCHC 33.1 11/23/2021 0116   RDW 14.1 11/23/2021 0116   LYMPHSABS 3.0 11/23/2021 0116   MONOABS 1.4 (H) 11/23/2021 0116   EOSABS 0.4 11/23/2021 0116   BASOSABS 0.2 (H) 11/23/2021 0116      Latest Ref Rng &  Units 11/23/2021    1:16 AM 11/22/2021    1:59 AM 11/20/2021    2:14 AM  CMP  Glucose 70 - 99 mg/dL 168  163  118   BUN 8 - 23 mg/dL 25  26  33   Creatinine 0.61 - 1.24 mg/dL 2.05  1.96  1.88   Sodium 135 - 145 mmol/L 143  141  138   Potassium 3.5 - 5.1 mmol/L 3.8  4.1  3.8   Chloride 98 - 111 mmol/L 114  113  112   CO2 22 - 32 mmol/L '18  22  15   '$ Calcium 8.9 - 10.3 mg/dL 9.2  9.1  8.5      Radiology Studies: No results found.   Scheduled Meds:  [MAR Hold] enoxaparin (LOVENOX) injection  30 mg Subcutaneous Daily   [MAR Hold] fluticasone furoate-vilanterol  1 puff Inhalation Daily   [MAR Hold] insulin aspart  0-5 Units Subcutaneous QHS   [MAR Hold] insulin aspart  0-9 Units Subcutaneous TID WC   [MAR Hold] nystatin   Topical TID   [MAR Hold] pantoprazole (PROTONIX) IV  40 mg Intravenous Q24H   [MAR Hold] sodium chloride flush  3 mL Intravenous Q12H   Continuous Infusions:  sodium chloride 50 mL/hr at 11/24/21 1208   [MAR Hold] linezolid (ZYVOX) IV 600 mg (11/24/21 1211)   [MAR Hold] piperacillin-tazobactam (ZOSYN)  IV 3.375 g (11/24/21 0500)     LOS: 8 days   Time spent: [place], MD [facility] To contact the attending provider between 7A-7P or the covering provider during after hours 7P-7A, please log into the web site [email or web address] and access using universal  password for that web site. If you do not have the password, please call the hospital operator.  11/24/2021, 3:11 PM

## 2021-11-24 NOTE — Progress Notes (Signed)
[facility] [place] PROGRESS NOTE  ASSESSMENT / PLAN: Justin [name] is a 87 y.o. male with PAD and right foot gangrene. Plan angiogram today.  SUBJECTIVE: No complaints.   OBJECTIVE: BP 136/69 (BP Location: Right Arm)   Pulse 82   Temp 98.6 F (37 C) (Oral)   Resp 20   Ht '6\' 2"'$  (1.88 m)   Wt 72.3 kg   SpO2 96%   BMI 20.46 kg/m   Intake/Output Summary (Last 24 hours) at 11/24/2021 1739 Last data filed at 11/24/2021 1700 Gross per 24 hour  Intake 240 ml  Output 360 ml  Net -120 ml    No distress. Mild confusion RRR Unlabored Unchanged appearance of foot     Latest Ref Rng & Units 11/23/2021    1:16 AM 11/22/2021    1:59 AM 11/20/2021    2:14 AM  CBC  WBC 4.0 - 10.5 K/uL 16.9  16.6  14.7   Hemoglobin 13.0 - 17.0 g/dL 10.5  10.3  10.2   Hematocrit 39.0 - 52.0 % 31.7  31.3  30.1   Platelets 150 - 400 K/uL 387  329  259         Latest Ref Rng & Units 11/23/2021    1:16 AM 11/22/2021    1:59 AM 11/20/2021    2:14 AM  CMP  Glucose 70 - 99 mg/dL 168  163  118   BUN 8 - 23 mg/dL 25  26  33   Creatinine 0.61 - 1.24 mg/dL 2.05  1.96  1.88   Sodium 135 - 145 mmol/L 143  141  138   Potassium 3.5 - 5.1 mmol/L 3.8  4.1  3.8   Chloride 98 - 111 mmol/L 114  113  112   CO2 22 - 32 mmol/L '18  22  15   '$ Calcium 8.9 - 10.3 mg/dL 9.2  9.1  8.5     Estimated Creatinine Clearance: 26.9 mL/min (A) (by C-G formula based on SCr of 2.05 mg/dL (H)).   Justin [name]. Stanford Breed, MD [facility] Phone Number: [phone] 11/24/2021 5:39 PM

## 2021-11-24 NOTE — Progress Notes (Signed)
Pt admitted to [facility] from Cath lab.  Pt is A&O X2 and neuro intact.  Pt placed on telemetry and CCMD notified.  Vitals taken and within normal range.  L groin incision is level 0 with no signs of hematoma.  Bilateral LE's are warm with palpable pulses.    11/24/21 1730  Vitals  Temp 98.6 F (37 C)  Temp Source Oral  BP 136/69  MAP (mmHg) 88  BP Location Right Arm  BP Method Automatic  Patient Position (if appropriate) Lying  Pulse Rate 82  Pulse Rate Source Monitor  ECG Heart Rate 82  Resp 20  Level of Consciousness  Level of Consciousness Alert  Oxygen Therapy  SpO2 96 %  O2 Device Room Air  Patient Activity (if Appropriate) In bed  Pulse Oximetry Type Continuous  Pain Assessment  Pain Scale 0-10  Pain Score 0  Glasgow Coma Scale  Eye Opening 4  Best Verbal Response (NON-intubated) 4  Best Motor Response 6  Glasgow Coma Scale Score 14  MEWS Score  MEWS Temp 0  MEWS Systolic 0  MEWS Pulse 0  MEWS RR 0  MEWS LOC 0  MEWS Score 0  MEWS Score Color Green

## 2021-11-24 NOTE — Progress Notes (Signed)
SLP Cancellation Note  Patient Details Name: Justin [name] MRN: [id number] DOB: 1934-08-22   Cancelled treatment:       Reason Eval/Treat Not Completed: Other (comment) (Pt NPO for precedure per RN's report, but pt's daugher denied the pt having any difficulty with the advanced diet. SLP will follow up on a subsequent date.)  Micaiah Litle I. Hardin Negus, [place], [place] Office number [phone]  [name] 11/24/2021, 1:35 PM

## 2021-11-24 NOTE — Op Note (Signed)
DATE OF SERVICE: 11/24/2021  PATIENT:  Justin [name]  87 y.o. 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  PATIENT DISPOSITION:  PACU - hemodynamically stable.   Delay start of Pharmacological VTE agent (>24hrs) due to surgical blood loss or risk of bleeding: no  INDICATION FOR PROCEDURE: Justin KARELis a 87y.o. male with right foot gangrene and noninvasive evidence of peripheral arterial disease.. After careful discussion of risks, benefits, and alternatives the patient was offered angiography with possible intervention. The patient understood and wished to proceed.  OPERATIVE FINDINGS:  Terminal aorta and iliac arteries: Widely patent without flow-limiting stenosis  Right lower extremity: Common femoral artery: Patent without stenosis Profunda femoris artery: Patent without stenosis  Superficial femoral artery: Patent without stenosis Popliteal artery: Critical behind and below-knee stenosis (greater than 90%) Anterior tibial artery: Occluded Tibioperoneal trunk: Severe stenosis (70%) Peroneal artery: Severe, multifocal stenosis (greatest 70%) Posterior tibial artery: Occluded Pedal circulation: Fills via collateralization from the peroneal artery.  Severely disadvantaged in the mid and distal foot.  GLASS score. FP 4. IP 4. Stage III. High complexity disease.   WIfI  score. 2 / 1 / 0. Clinical stage III. Moderate amputation risk.  DESCRIPTION OF PROCEDURE: After identification of the patient in the pre-operative holding area, the patient was transferred to the operating room. The patient was positioned supine on the operating room table. Anesthesia was induced. The groins was prepped and draped in standard fashion. A surgical pause was performed confirming correct patient, procedure, and operative location.  The left groin was anesthetized with subcutaneous injection of 1% lidocaine. Using ultrasound guidance, the left common femoral artery was accessed with micropuncture technique. Fluoroscopy was used to confirm cannulation over the femoral head. The 53F sheath was upsized to 31F.   A Benson wire was advanced into the distal aorta. Over the wire an omni flush catheter was advanced to the level of L2. Aortogram was performed - see above for details.   The right common iliac artery was selected with an omniflush catheter and guidewire. The wire was advanced into the common femoral artery. Over the wire the omni flush catheter was advanced into the external iliac artery. Selective angiography was performed - see above for details.   The decision was made to intervene. The patient was heparinized with 7,000 units of heparin. The 31F sheath was exchanged for a 31F x 45cm sheath. Selective angiography of the left lower extremity was performed prior to intervention.   The lesions were treated with: Right popliteal artery drug-coated angioplasty (4x60 Inpact) Right tibioperoneal trunk angioplasty (3 x 220 mm Sterling) Right peroneal artery angioplasty (3x2247mSterling)   Completion angiography revealed:  Resolution of stenoses and restoration of inline flow to the ankle. Disadvantaged pedal circulation  A perclose device was used to close the arteriotomy. Hemostasis was excellent upon completion.  Upon completion of the case instrument and sharps counts were  confirmed correct. The patient was transferred  to the  PACU in good condition. I was present for all portions of the procedure.  PLAN: ASA '81mg'$  PO QD. Plavix '75mg'$  PO QD. High intensity statin therapy. Optimized from a vascular standpoint.   Justin Aline. Stanford Breed, MD [facility] Phone Number: [phone] 11/24/2021 5:40 PM

## 2021-11-24 NOTE — Progress Notes (Addendum)
Pt returned to unit from cath lab. No angiogram done d/t loss of functional IV. Upon arrival back to 6N, new peripheral IV inserted. Will contact cath lab for further plan of care

## 2021-11-24 NOTE — Progress Notes (Signed)
In cath lab. Will see the patient in the morning.

## 2021-11-25 DIAGNOSIS — L8990 Pressure ulcer of unspecified site, unspecified stage: Secondary | ICD-10-CM | POA: Insufficient documentation

## 2021-11-25 DIAGNOSIS — I96 Gangrene, not elsewhere classified: Secondary | ICD-10-CM

## 2021-11-25 DIAGNOSIS — E11628 Type 2 diabetes mellitus with other skin complications: Secondary | ICD-10-CM | POA: Diagnosis not present

## 2021-11-25 DIAGNOSIS — L089 Local infection of the skin and subcutaneous tissue, unspecified: Secondary | ICD-10-CM | POA: Diagnosis not present

## 2021-11-25 LAB — CBC WITH DIFFERENTIAL/PLATELET
Abs Immature Granulocytes: 0.16 10*3/uL — ABNORMAL HIGH (ref 0.00–0.07)
Basophils Absolute: 0.2 10*3/uL — ABNORMAL HIGH (ref 0.0–0.1)
Basophils Relative: 1 %
Eosinophils Absolute: 0.4 10*3/uL (ref 0.0–0.5)
Eosinophils Relative: 3 %
HCT: 28 % — ABNORMAL LOW (ref 39.0–52.0)
Hemoglobin: 9.4 g/dL — ABNORMAL LOW (ref 13.0–17.0)
Immature Granulocytes: 1 %
Lymphocytes Relative: 16 %
Lymphs Abs: 2.6 10*3/uL (ref 0.7–4.0)
MCH: 30.1 pg (ref 26.0–34.0)
MCHC: 33.6 g/dL (ref 30.0–36.0)
MCV: 89.7 fL (ref 80.0–100.0)
Monocytes Absolute: 1.3 10*3/uL — ABNORMAL HIGH (ref 0.1–1.0)
Monocytes Relative: 8 %
Neutro Abs: 11.1 10*3/uL — ABNORMAL HIGH (ref 1.7–7.7)
Neutrophils Relative %: 71 %
Platelets: 339 10*3/uL (ref 150–400)
RBC: 3.12 MIL/uL — ABNORMAL LOW (ref 4.22–5.81)
RDW: 13.9 % (ref 11.5–15.5)
WBC: 15.7 10*3/uL — ABNORMAL HIGH (ref 4.0–10.5)
nRBC: 0 % (ref 0.0–0.2)

## 2021-11-25 LAB — COMPREHENSIVE METABOLIC PANEL
ALT: 22 U/L (ref 0–44)
AST: 25 U/L (ref 15–41)
Albumin: 2.1 g/dL — ABNORMAL LOW (ref 3.5–5.0)
Alkaline Phosphatase: 76 U/L (ref 38–126)
Anion gap: 7 (ref 5–15)
BUN: 24 mg/dL — ABNORMAL HIGH (ref 8–23)
CO2: 20 mmol/L — ABNORMAL LOW (ref 22–32)
Calcium: 8.4 mg/dL — ABNORMAL LOW (ref 8.9–10.3)
Chloride: 107 mmol/L (ref 98–111)
Creatinine, Ser: 1.94 mg/dL — ABNORMAL HIGH (ref 0.61–1.24)
GFR, Estimated: 33 mL/min — ABNORMAL LOW (ref >60)
Glucose, Bld: 313 mg/dL — ABNORMAL HIGH (ref 70–99)
Potassium: 3.5 mmol/L (ref 3.5–5.1)
Sodium: 134 mmol/L — ABNORMAL LOW (ref 135–145)
Total Bilirubin: 0.4 mg/dL (ref 0.3–1.2)
Total Protein: 8 g/dL (ref 6.5–8.1)

## 2021-11-25 LAB — GLUCOSE, CAPILLARY
Glucose-Capillary: 243 mg/dL — ABNORMAL HIGH (ref 70–99)
Glucose-Capillary: 277 mg/dL — ABNORMAL HIGH (ref 70–99)
Glucose-Capillary: 193 mg/dL — ABNORMAL HIGH (ref 70–99)
Glucose-Capillary: 151 mg/dL — ABNORMAL HIGH (ref 70–99)

## 2021-11-25 LAB — LIPID PANEL
Cholesterol: 93 mg/dL (ref 0–200)
HDL: 29 mg/dL — ABNORMAL LOW (ref >40)
LDL Cholesterol: 46 mg/dL (ref 0–99)
Total CHOL/HDL Ratio: 3.2 RATIO
Triglycerides: 88 mg/dL (ref <150)
VLDL: 18 mg/dL (ref 0–40)

## 2021-11-25 MED ORDER — CHLORHEXIDINE GLUCONATE CLOTH 2 % EX PADS
6.0000 | MEDICATED_PAD | Freq: Once | CUTANEOUS | Status: AC
  Administered 2021-11-26: 6 via TOPICAL

## 2021-11-25 MED ORDER — INSULIN GLARGINE-YFGN 100 UNIT/ML ~~LOC~~ SOLN
10.0000 [IU] | Freq: Every day | SUBCUTANEOUS | Status: DC
  Administered 2021-11-25 – 2021-11-28 (×3): 10 [IU] via SUBCUTANEOUS
  Filled 2021-11-25 (×4): qty 0.1

## 2021-11-25 MED ORDER — CHLORHEXIDINE GLUCONATE CLOTH 2 % EX PADS: 6.0000 | MEDICATED_PAD | Freq: Once | CUTANEOUS | Status: DC

## 2021-11-25 NOTE — Plan of Care (Signed)
Plan of care Problem: Nutritional: Goal: Maintenance of adequate nutrition will improve Outcome: Progressing   Problem: Cardiovascular: Goal: Ability to achieve and maintain adequate cardiovascular perfusion will improve Outcome: Progressing   Problem: Cardiovascular: Goal: Vascular access site(s) Level 0-1 will be maintained Outcome: Progressing   Problem: Skin Integrity: Goal: Risk for impaired skin integrity will decrease Outcome: Progressing   Problem: Pain Managment: Goal: General experience of comfort will improve Outcome: Progressing   Problem: Clinical Measurements: Goal: Respiratory complications will improve Outcome: Progressing   Problem: Clinical Measurements: Goal: Will remain free from infection Outcome: Progressing   Problem: Education: Goal: Ability to describe self-care measures that may prevent or decrease complications (Diabetes Survival Skills Education) will improve Outcome: Not Progressing Underlying dementia impairment cognitive function

## 2021-11-25 NOTE — H&P (View-Only) (Signed)
PODIATRY PROGRESS NOTE  NAME Justin [name] MRN [id number] DOB 1934-08-18 DOA 11/15/2021   Reason for consult:  Chief Complaint  Patient presents with   Foot Pain     History of present illness: 87 y.o. male in the hospital for right foot swelling.  Found to have leukocytosis.  X-ray and MRI performed.  Patient also had a pneumonia.  Patient recently underwent angio with vascular surgery.  He has gangrene of the right second toe.  Vitals:   11/25/21 0433 11/25/21 0805  BP: 135/69 136/72  Pulse: 82 74  Resp: 15 20  Temp: 97.9 F (36.6 C) 98.9 F (37.2 C)  SpO2: 100% 97%       Latest Ref Rng & Units 11/25/2021    2:04 AM 11/24/2021    5:54 PM 11/23/2021    1:16 AM  CBC  WBC 4.0 - 10.5 K/uL 15.7  15.2  16.9   Hemoglobin 13.0 - 17.0 g/dL 9.4  11.2  10.5   Hematocrit 39.0 - 52.0 % 28.0  34.5  31.7   Platelets 150 - 400 K/uL 339  405  387        Latest Ref Rng & Units 11/25/2021    2:04 AM 11/24/2021    5:54 PM 11/23/2021    1:16 AM  BMP  Glucose 70 - 99 mg/dL 313  220  168   BUN 8 - 23 mg/dL '24  23  25   '$ Creatinine 0.61 - 1.24 mg/dL 1.94  1.91  2.05   Sodium 135 - 145 mmol/L 134  139  143   Potassium 3.5 - 5.1 mmol/L 3.5  3.9  3.8   Chloride 98 - 111 mmol/L 107  111  114   CO2 22 - 32 mmol/L '20  19  18   '$ Calcium 8.9 - 10.3 mg/dL 8.4  8.9  9.2       Physical Exam: General: NAD-patient awake and knows name, birthday.  Dermatology: Gangrenous changes present to right second toe.  On the right hallux there is no ulceration, edema, erythema noted.  See pictures below.  There is some mild hyperpigmentation to the dorsal forefoot as well.  Preulcerative lesion noted on the heels without any current skin breakdown.  Vascular: Right tibioperoneal trunk and Right peroneal artery.  Patent inflow CFA, profunda, with popliteal stenosis > 90%.  Occluded AT and PT.  Pedal circulation fills with collaterals.  Neurological: Sensation decreased    Musculoskeletal Exam: No  significant pain.    ASSESSMENT/PLAN OF CARE Gangrene, osteomyelitis  I discussed the findings with the patient.  He is aware of the gangrene of the right second toe.  Discussed with the patient surgical intervention for amputation of the right second toe and he is in agreement to this.  I also contacted the patient's daughter, Darrien Belter, to discuss this.  I discussed with her the pros and cons of surgery as well as all alternatives, risks, complications.  Discussed the right second toe amputation and possible partial right hallux amputation.  We discussed risks of surgery and she is aware if she wants to proceed with surgery.  We discussed risk of surgery including spread of infection, delayed or nonhealing, proximal amputation as well as general risk of surgery. -NPO -Consent to be signed -Plan for surgery tomorrow morning.   -I spoke with the patients daughter to go over the surgery including all alternative, risks, complications. I spoke with her for 6 minutes discussing the case and she is  in agreement to proceeding with surgery.    Please contact me directly with any questions or concerns.     Celesta Gentile, DPM [facility]  Dr. Bonna Gains. Daquan Cra[address], [address] [place], [place] [address]                Office [phone]  Fax [phone]

## 2021-11-25 NOTE — Progress Notes (Signed)
PODIATRY PROGRESS NOTE  NAME Justin [name] MRN [id number] DOB Oct 02, 1934 DOA 11/15/2021   Reason for consult:  Chief Complaint  Patient presents with   Foot Pain     History of present illness: 87 y.o. male in the hospital for right foot swelling.  Found to have leukocytosis.  X-ray and MRI performed.  Patient also had a pneumonia.  Patient recently underwent angio with vascular surgery.  He has gangrene of the right second toe.  Vitals:   11/25/21 0433 11/25/21 0805  BP: 135/69 136/72  Pulse: 82 74  Resp: 15 20  Temp: 97.9 F (36.6 C) 98.9 F (37.2 C)  SpO2: 100% 97%       Latest Ref Rng & Units 11/25/2021    2:04 AM 11/24/2021    5:54 PM 11/23/2021    1:16 AM  CBC  WBC 4.0 - 10.5 K/uL 15.7  15.2  16.9   Hemoglobin 13.0 - 17.0 g/dL 9.4  11.2  10.5   Hematocrit 39.0 - 52.0 % 28.0  34.5  31.7   Platelets 150 - 400 K/uL 339  405  387        Latest Ref Rng & Units 11/25/2021    2:04 AM 11/24/2021    5:54 PM 11/23/2021    1:16 AM  BMP  Glucose 70 - 99 mg/dL 313  220  168   BUN 8 - 23 mg/dL '24  23  25   '$ Creatinine 0.61 - 1.24 mg/dL 1.94  1.91  2.05   Sodium 135 - 145 mmol/L 134  139  143   Potassium 3.5 - 5.1 mmol/L 3.5  3.9  3.8   Chloride 98 - 111 mmol/L 107  111  114   CO2 22 - 32 mmol/L '20  19  18   '$ Calcium 8.9 - 10.3 mg/dL 8.4  8.9  9.2       Physical Exam: General: NAD-patient awake and knows name, birthday.  Dermatology: Gangrenous changes present to right second toe.  On the right hallux there is no ulceration, edema, erythema noted.  See pictures below.  There is some mild hyperpigmentation to the dorsal forefoot as well.  Preulcerative lesion noted on the heels without any current skin breakdown.  Vascular: Right tibioperoneal trunk and Right peroneal artery.  Patent inflow CFA, profunda, with popliteal stenosis > 90%.  Occluded AT and PT.  Pedal circulation fills with collaterals.  Neurological: Sensation decreased    Musculoskeletal Exam: No  significant pain.    ASSESSMENT/PLAN OF CARE Gangrene, osteomyelitis  I discussed the findings with the patient.  He is aware of the gangrene of the right second toe.  Discussed with the patient surgical intervention for amputation of the right second toe and he is in agreement to this.  I also contacted the patient's daughter, Justin [name], to discuss this.  I discussed with her the pros and cons of surgery as well as all alternatives, risks, complications.  Discussed the right second toe amputation and possible partial right hallux amputation.  We discussed risks of surgery and she is aware if she wants to proceed with surgery.  We discussed risk of surgery including spread of infection, delayed or nonhealing, proximal amputation as well as general risk of surgery. -NPO -Consent to be signed -Plan for surgery tomorrow morning.   -I spoke with the patients daughter to go over the surgery including all alternative, risks, complications. I spoke with her for 6 minutes discussing the case and she is  in agreement to proceeding with surgery.    Please contact me directly with any questions or concerns.     Celesta Gentile, DPM [facility]  Dr. Bonna Gains. Mesha Schamberger, [address], [place] [address]                Office [phone]  Fax [phone]

## 2021-11-25 NOTE — Progress Notes (Addendum)
PROGRESS NOTE   Justin [name]  KKX:[id number] DOB: 1935/01/17 DOA: 11/15/2021 [facility]CP: [facility]  Brief Narrative:  87 year old white male known skilled facility resident Known CAD reflux DM TY 2 Prior stroke HLD HTN Chronic pancreatitis Subsequent ischemic stroke 1/69/6789 with metabolic encephalopathy at that admission and agitation--- he also had COVID-19 at that time  Brought to [facility] 11/15/2021 Tmax 104 as well as right foot swelling Found to have leukocytosis 16.8 normal lactic acid CRP 10 Right foot x-ray showed erosion of distal phalanx with exposed bone MRI was ordered Was also found to have a pneumonia  8/10-vascular as well as podiatry consulted-ABIs 8/10 showed moderate right lower extremity disease on right and mild disease on left 8/13  confused 8/14 EEG suggestive of cefepime induced neurotoxicity 8/15 mental status improved slightly 8/17 Diet graduated upwards--Mental status significantly improving 8/18 Angio resulting in lesions treated with Right popliteal artery drug-coated angioplasty (4x60 Inpact) Right tibioperoneal trunk angioplasty (3 x 220 mm Sterling) Right peroneal artery angioplasty (3x246m Sterling)      Hospital-Problem based course  Aspiration pneumonia in the setting of prior CVA 05/2021 he is more alert today have graduated him to dysphagia 2 and he is tolerating this fair--watch for concerns for aspiration--completed azithromycin 8/15 Continue saline 50 cc/H Diabetic foot infection with possible osteomyelitis Continue Zosyn -- vancomycin -->linezolid 2/2 ^ cre Oxycodone preferentially to IV pain meds and Tylenol Angiogram 8/18 as above-optimized with stenting Amputation planned by podiatrist for 83/81Toxic metabolic encephalopathy?  Secondary to cefepime EEG showed triphasic waves?  Secondary to cefepime induced neurotoxicity--encephalopathic pattern was felt to be more predominant per neurology Mental status  improved CT scan stable Neurontin 400 at bedtime only Intertrigo of groin Started on nystatin which can continue Diabetes mellitus type 2 with neuropathy CBG ranging between 190-250 Would only use Neurontin at night Home dose of 24 units Lantus--Adding Lantus 10 units today given sugars in the 300s HLD CAD prior CVA 05/2021 Holding the majority of p.o. meds can resume aspirin statin etc. when taking all meals and when all procedures are finished CKD 3B Creatinine ranging between 1.8 and 1.9 which is close to baseline    DVT prophylaxis: Lovenox Code Status: Full Family Communication: Discussed on several days with daughter ETerrence Dupont Disposition:  Status is: Inpatient Remains inpatient appropriate because:   Encephalopathy is improved now going for toe amputation on 8/20 Therapy to see and determine next steps in terms of disposition  Consultants:  Vascular Podiatry  Procedures: Multiple  Antimicrobials: As above   Subjective:  Fair no distress Eating drinking   Objective: Vitals:   11/24/21 1933 11/24/21 2348 11/25/21 0433 11/25/21 0629  BP: (!) 118/58 (!) 140/76 135/69   Pulse: 88 78 82   Resp: '14 20 15   '$ Temp: 98.1 F (36.7 C) 98.3 F (36.8 C) 97.9 F (36.6 C)   TempSrc: Axillary Oral Oral   SpO2: 97% 98% 100%   Weight:    68.3 kg  Height:        Intake/Output Summary (Last 24 hours) at 11/25/2021 0734 Last data filed at 11/25/2021 0654 Gross per 24 hour  Intake 2958.5 ml  Output 562 ml  Net 2396.5 ml    Filed Weights   11/22/21 0818 11/25/21 0629  Weight: 72.3 kg 68.3 kg    Examination:  Coherent x2-occasionally confused otherwise pleasant S1-S2 no murmur Telemetry is benign Abdomen is soft no rebound Wounds not examined today  Data Reviewed: personally reviewed  CBC    Component Value Date/Time   WBC 15.7 (H) 11/25/2021 0204   RBC 3.12 (L) 11/25/2021 0204   HGB 9.4 (L) 11/25/2021 0204   HCT 28.0 (L) 11/25/2021 0204   PLT 339  11/25/2021 0204   MCV 89.7 11/25/2021 0204   MCV 89.5 12/31/2013 2117   MCH 30.1 11/25/2021 0204   MCHC 33.6 11/25/2021 0204   RDW 13.9 11/25/2021 0204   LYMPHSABS 2.6 11/25/2021 0204   MONOABS 1.3 (H) 11/25/2021 0204   EOSABS 0.4 11/25/2021 0204   BASOSABS 0.2 (H) 11/25/2021 0204      Latest Ref Rng & Units 11/25/2021    2:04 AM 11/24/2021    5:54 PM 11/23/2021    1:16 AM  CMP  Glucose 70 - 99 mg/dL 313  220  168   BUN 8 - 23 mg/dL '24  23  25   '$ Creatinine 0.61 - 1.24 mg/dL 1.94  1.91  2.05   Sodium 135 - 145 mmol/L 134  139  143   Potassium 3.5 - 5.1 mmol/L 3.5  3.9  3.8   Chloride 98 - 111 mmol/L 107  111  114   CO2 22 - 32 mmol/L '20  19  18   '$ Calcium 8.9 - 10.3 mg/dL 8.4  8.9  9.2   Total Protein 6.5 - 8.1 g/dL 8.0     Total Bilirubin 0.3 - 1.2 mg/dL 0.4     Alkaline Phos 38 - 126 U/L 76     AST 15 - 41 U/L 25     ALT 0 - 44 U/L 22        Radiology Studies: PERIPHERAL VASCULAR CATHETERIZATION  Result Date: 11/24/2021 DATE OF SERVICE: 11/24/2021  PATIENT:  Justin [name]  87 y.o. male  PRE-OPERATIVE DIAGNOSIS:  Atherosclerosis of native arteries of right lower extremity causing gangrene  POST-OPERATIVE DIAGNOSIS:  Same  PROCEDURE:  1) Ultrasound guided left common femoral artery access 2) Aortogram 3) Right lower extremity angiogram with third order cannulation 4) Right popliteal artery drug-coated angioplasty (4x60 Inpact) 5) Right tibioperoneal trunk angioplasty (3 x 220 mm Sterling) 6) Right peroneal artery angioplasty (3x265m Sterling)  SURGEON:  TYevonne Aline HStanford Breed MD  ASSISTANT: none  ANESTHESIA:   local  ESTIMATED BLOOD LOSS: minimal  LOCAL MEDICATIONS USED:  LIDOCAINE  COUNTS: confirmed correct.  PATIENT DISPOSITION:  PACU - hemodynamically stable.  Delay start of Pharmacological VTE agent (>24hrs) due to surgical blood loss or risk of bleeding: no  INDICATION FOR PROCEDURE: Justin BUSHEYis a 87y.o. male with right foot gangrene and noninvasive evidence of peripheral  arterial disease.. After careful discussion of risks, benefits, and alternatives the patient was offered angiography with possible intervention. The patient understood and wished to proceed.  OPERATIVE FINDINGS: Terminal aorta and iliac arteries: Widely patent without flow-limiting stenosis  Right lower extremity: Common femoral artery: Patent without stenosis Profunda femoris artery: Patent without stenosis Superficial femoral artery: Patent without stenosis Popliteal artery: Critical behind and below-knee stenosis (greater than 90%) Anterior tibial artery: Occluded Tibioperoneal trunk: Severe stenosis (70%) Peroneal artery: Severe, multifocal stenosis (greatest 70%) Posterior tibial artery: Occluded Pedal circulation: Fills via collateralization from the peroneal artery.  Severely disadvantaged in the mid and distal foot.  GLASS score. FP 4. IP 4. Stage III. High complexity disease.  WIfI score. 2 / 1 / 0. Clinical stage III. Moderate amputation risk.  DESCRIPTION OF PROCEDURE: After identification of the patient in the pre-operative holding area, the patient was transferred  to the operating room. The patient was positioned supine on the operating room table. Anesthesia was induced. The groins was prepped and draped in standard fashion. A surgical pause was performed confirming correct patient, procedure, and operative location.  The left groin was anesthetized with subcutaneous injection of 1% lidocaine. Using ultrasound guidance, the left common femoral artery was accessed with micropuncture technique. Fluoroscopy was used to confirm cannulation over the femoral head. The 143F sheath was upsized to 734F.  A Benson wire was advanced into the distal aorta. Over the wire an omni flush catheter was advanced to the level of L2. Aortogram was performed - see above for details.  The right common iliac artery was selected with an omniflush catheter and guidewire. The wire was advanced into the common femoral artery. Over  the wire the omni flush catheter was advanced into the external iliac artery. Selective angiography was performed - see above for details.  The decision was made to intervene. The patient was heparinized with 7,000 units of heparin. The 734F sheath was exchanged for a 43F x 45cm sheath. Selective angiography of the left lower extremity was performed prior to intervention.  The lesions were treated with: Right popliteal artery drug-coated angioplasty (4x60 Inpact) Right tibioperoneal trunk angioplasty (3 x 220 mm Sterling) Right peroneal artery angioplasty (3x238m Sterling)  Completion angiography revealed: Resolution of stenoses and restoration of inline flow to the ankle. Disadvantaged pedal circulation  A perclose device was used to close the arteriotomy. Hemostasis was excellent upon completion.  Upon completion of the case instrument and sharps counts were confirmed correct. The patient was transferred to the  PACU in good condition. I was present for all portions of the procedure.  PLAN: ASA '81mg'$  PO QD. Plavix '75mg'$  PO QD. High intensity statin therapy. Optimized from a vascular standpoint.  TYevonne Aline HStanford Breed MD [facility] Number: [phone]/18/2023 5:40 PM     Scheduled Meds:  aspirin EC  81 mg Oral Daily   clopidogrel  75 mg Oral Q breakfast   enoxaparin (LOVENOX) injection  30 mg Subcutaneous Daily   fluticasone furoate-vilanterol  1 puff Inhalation Daily   insulin aspart  0-5 Units Subcutaneous QHS   insulin aspart  0-9 Units Subcutaneous TID WC   nystatin   Topical TID   pantoprazole (PROTONIX) IV  40 mg Intravenous Q24H   sodium chloride flush  3 mL Intravenous Q12H   sodium chloride flush  3 mL Intravenous Q12H   Continuous Infusions:  sodium chloride 50 mL/hr at 11/25/21 0721   sodium chloride     linezolid (ZYVOX) IV 600 mg (11/24/21 2204)   piperacillin-tazobactam (ZOSYN)  IV 3.375 g (11/25/21 0554)     LOS: 9 days   Time spent:  4[place] MD [facility] To contact the attending provider between 7A-7P or the covering provider during after hours 7P-7A, please log into the web site [email or web address] and access using universal [place] password for that web site. If you do not have the password, please call the hospital operator.  11/25/2021, 7:34 AM

## 2021-11-25 NOTE — Progress Notes (Addendum)
[facility] [place]  Subjective  - No new complaints    Objective 135/69 82 97.9 F (36.6 C) (Oral) 15 100%  Intake/Output Summary (Last 24 hours) at 11/25/2021 0643 Last data filed at 11/25/2021 0435 Gross per 24 hour  Intake 1538.56 ml  Output 560 ml  Net 978.56 ml    Left groin soft without hematoma Right AT/peroneal signal intact Lungs non labored breathing Genral no acute distress  Assessment/Planning:  Justin [name] is a 87 y.o. male with right foot gangrene  POD # 1 Right LE angiogram with angioplasty Right tibioperoneal trunk and Right peroneal artery.  Patent inflow CFA, profunda, with popliteal stenosis > 90%.  Occluded AT and PT.  Pedal circulation fills with collaterals.    PLAN: ASA '81mg'$  PO QD. Plavix '75mg'$  PO QD. High intensity statin therapy. Optimized from a vascular standpoint.     Roxy Horseman 11/25/2021 6:43 AM --  Laboratory Lab Results: Recent Labs    11/24/21 1754 11/25/21 0204  WBC 15.2* 15.7*  HGB 11.2* 9.4*  HCT 34.5* 28.0*  PLT 405* 339   BMET Recent Labs    11/24/21 1754 11/25/21 0204  NA 139 134*  K 3.9 3.5  CL 111 107  CO2 19* 20*  GLUCOSE 220* 313*  BUN 23 24*  CREATININE 1.91* 1.94*  CALCIUM 8.9 8.4*    COAG Lab Results  Component Value Date   INR 1.1 05/21/2021   No results found for: "PTT"   VASCULAR STAFF ADDENDUM: I have independently interviewed and examined the patient. I agree with the above.  Good result from angioplasty yesterday. Palpable peroneal pulse. Small vessel disease noted on angioplasty, but he is optimized from a vascular standpoint.  ASA / Plavix / Statin ongoing. Podiatry planning toe amputations tomorrow. Patient should follow up with [place] in 1 month with repeat non-invasive testing. Please call for questions.  [name], MD [facility] Phone Number: [phone] 11/25/2021 4:23 PM

## 2021-11-25 NOTE — Progress Notes (Signed)
PHARMACIST LIPID MONITORING   Justin [name] is a 87 y.o. male admitted on 11/15/2021 with PAD.  Pharmacy has been consulted to optimize lipid-lowering therapy with the indication of secondary prevention for clinical ASCVD.  Recent Labs:  Lipid Panel (last 6 months):   Lab Results  Component Value Date   CHOL 93 11/25/2021   TRIG 88 11/25/2021   HDL 29 (L) 11/25/2021   CHOLHDL 3.2 11/25/2021   VLDL 18 11/25/2021   LDLCALC 46 11/25/2021    Hepatic function panel (last 6 months):   Lab Results  Component Value Date   AST 25 11/25/2021   ALT 22 11/25/2021   ALKPHOS 76 11/25/2021   BILITOT 0.4 11/25/2021    SCr (since admission):   Serum creatinine: 1.94 mg/dL (H) 11/25/21 0204 Estimated creatinine clearance: 26.9 mL/min (A)  Current therapy and lipid therapy tolerance Current lipid-lowering therapy: atorvastatin 80 mg daily PTA Previous lipid-lowering therapies (if applicable):  Documented or reported allergies or intolerances to lipid-lowering therapies (if applicable):   Assessment:   Patient agrees with changes to lipid-lowering therapy  Plan:    1.Statin intensity (high intensity recommended for all patients regardless of the LDL):  Add or increase statin to high intensity. Recommend resuming atorvastatin 80 mg daily. Vascular plans are noted to resume statin.   2.Add ezetimibe (if any one of the following):   Not indicated at this time.  3.Refer to lipid clinic:   No  4.Follow-up with:  Primary care provider - [facility]  5.Follow-up labs after discharge:  No changes in lipid therapy, repeat a lipid panel in one year.       Eliseo Gum, PharmD PGY1 Pharmacy Resident   11/25/2021  2:11 PM

## 2021-11-26 ENCOUNTER — Encounter (HOSPITAL_COMMUNITY): Payer: Self-pay | Admitting: Internal Medicine

## 2021-11-26 ENCOUNTER — Inpatient Hospital Stay (HOSPITAL_COMMUNITY): Payer: Medicare Other

## 2021-11-26 ENCOUNTER — Encounter (HOSPITAL_COMMUNITY)
Admission: EM | Disposition: A | Discharge status: Skilled Nursing Facility | Payer: Self-pay | Source: Skilled Nursing Facility | Attending: Family Medicine

## 2021-11-26 ENCOUNTER — Inpatient Hospital Stay (HOSPITAL_COMMUNITY): Payer: Medicare Other | Admitting: Certified Registered Nurse Anesthetist

## 2021-11-26 ENCOUNTER — Other Ambulatory Visit: Payer: Self-pay

## 2021-11-26 DIAGNOSIS — E1169 Type 2 diabetes mellitus with other specified complication: Secondary | ICD-10-CM

## 2021-11-26 DIAGNOSIS — E1122 Type 2 diabetes mellitus with diabetic chronic kidney disease: Secondary | ICD-10-CM

## 2021-11-26 DIAGNOSIS — L089 Local infection of the skin and subcutaneous tissue, unspecified: Secondary | ICD-10-CM | POA: Diagnosis not present

## 2021-11-26 DIAGNOSIS — E1152 Type 2 diabetes mellitus with diabetic peripheral angiopathy with gangrene: Secondary | ICD-10-CM

## 2021-11-26 DIAGNOSIS — E11628 Type 2 diabetes mellitus with other skin complications: Secondary | ICD-10-CM | POA: Diagnosis not present

## 2021-11-26 DIAGNOSIS — D631 Anemia in chronic kidney disease: Secondary | ICD-10-CM

## 2021-11-26 DIAGNOSIS — M869 Osteomyelitis, unspecified: Secondary | ICD-10-CM | POA: Diagnosis not present

## 2021-11-26 DIAGNOSIS — I129 Hypertensive chronic kidney disease with stage 1 through stage 4 chronic kidney disease, or unspecified chronic kidney disease: Secondary | ICD-10-CM

## 2021-11-26 DIAGNOSIS — Z794 Long term (current) use of insulin: Secondary | ICD-10-CM

## 2021-11-26 DIAGNOSIS — N189 Chronic kidney disease, unspecified: Secondary | ICD-10-CM

## 2021-11-26 DIAGNOSIS — I96 Gangrene, not elsewhere classified: Secondary | ICD-10-CM

## 2021-11-26 HISTORY — PX: AMPUTATION TOE: SHX6595

## 2021-11-26 LAB — BASIC METABOLIC PANEL
Anion gap: 8 (ref 5–15)
BUN: 17 mg/dL (ref 8–23)
CO2: 19 mmol/L — ABNORMAL LOW (ref 22–32)
Calcium: 8.1 mg/dL — ABNORMAL LOW (ref 8.9–10.3)
Chloride: 105 mmol/L (ref 98–111)
Creatinine, Ser: 1.74 mg/dL — ABNORMAL HIGH (ref 0.61–1.24)
GFR, Estimated: 38 mL/min — ABNORMAL LOW (ref >60)
Glucose, Bld: 158 mg/dL — ABNORMAL HIGH (ref 70–99)
Potassium: 3.1 mmol/L — ABNORMAL LOW (ref 3.5–5.1)
Sodium: 132 mmol/L — ABNORMAL LOW (ref 135–145)

## 2021-11-26 LAB — GLUCOSE, CAPILLARY
Glucose-Capillary: 146 mg/dL — ABNORMAL HIGH (ref 70–99)
Glucose-Capillary: 148 mg/dL — ABNORMAL HIGH (ref 70–99)
Glucose-Capillary: 162 mg/dL — ABNORMAL HIGH (ref 70–99)
Glucose-Capillary: 166 mg/dL — ABNORMAL HIGH (ref 70–99)
Glucose-Capillary: 165 mg/dL — ABNORMAL HIGH (ref 70–99)
Glucose-Capillary: 156 mg/dL — ABNORMAL HIGH (ref 70–99)

## 2021-11-26 SURGERY — AMPUTATION, TOE
Anesthesia: General | Site: Toe | Laterality: Right

## 2021-11-26 MED ORDER — CHLORHEXIDINE GLUCONATE 0.12 % MT SOLN: 15.0000 mL | Freq: Once | OROMUCOSAL | Status: AC

## 2021-11-26 MED ORDER — SODIUM CHLORIDE (PF) 0.9 % IJ SOLN
INTRAMUSCULAR | Status: AC
  Filled 2021-11-26: qty 10

## 2021-11-26 MED ORDER — FENTANYL CITRATE (PF) 100 MCG/2ML IJ SOLN: 25.0000 ug | INTRAMUSCULAR | Status: DC | PRN

## 2021-11-26 MED ORDER — PROPOFOL 500 MG/50ML IV EMUL
INTRAVENOUS | Status: DC | PRN
  Administered 2021-11-26: 125 ug/kg/min via INTRAVENOUS

## 2021-11-26 MED ORDER — LIDOCAINE HCL 2 % IJ SOLN
INTRAMUSCULAR | Status: AC
  Filled 2021-11-26: qty 20

## 2021-11-26 MED ORDER — PROPOFOL 10 MG/ML IV BOLUS
INTRAVENOUS | Status: DC | PRN
  Administered 2021-11-26 (×2): 10 mg via INTRAVENOUS

## 2021-11-26 MED ORDER — AMISULPRIDE (ANTIEMETIC) 5 MG/2ML IV SOLN: 10.0000 mg | Freq: Once | INTRAVENOUS | Status: DC | PRN

## 2021-11-26 MED ORDER — ACETAMINOPHEN 10 MG/ML IV SOLN: 1000.0000 mg | Freq: Once | INTRAVENOUS | Status: DC | PRN

## 2021-11-26 MED ORDER — BUPIVACAINE HCL (PF) 0.5 % IJ SOLN
INTRAMUSCULAR | Status: DC | PRN
  Administered 2021-11-26: 5 mL

## 2021-11-26 MED ORDER — CHLORHEXIDINE GLUCONATE 0.12 % MT SOLN
OROMUCOSAL | Status: AC
  Administered 2021-11-26: 15 mL via OROMUCOSAL
  Filled 2021-11-26: qty 15

## 2021-11-26 MED ORDER — PROPOFOL 10 MG/ML IV BOLUS
INTRAVENOUS | Status: AC
  Filled 2021-11-26: qty 20

## 2021-11-26 MED ORDER — ONDANSETRON HCL 4 MG/2ML IJ SOLN
4.0000 mg | Freq: Once | INTRAMUSCULAR | Status: DC | PRN
Start: 2021-11-26 — End: 2021-11-26

## 2021-11-26 MED ORDER — ATORVASTATIN CALCIUM 80 MG PO TABS
80.0000 mg | ORAL_TABLET | Freq: Every day | ORAL | Status: DC
  Administered 2021-11-26 – 2021-11-28 (×3): 80 mg via ORAL
  Filled 2021-11-26 (×3): qty 1

## 2021-11-26 MED ORDER — LIDOCAINE HCL (PF) 1 % IJ SOLN
INTRAMUSCULAR | Status: AC
  Filled 2021-11-26: qty 30

## 2021-11-26 MED ORDER — PROPOFOL 1000 MG/100ML IV EMUL
INTRAVENOUS | Status: AC
  Filled 2021-11-26: qty 100

## 2021-11-26 MED ORDER — PHENYLEPHRINE 80 MCG/ML (10ML) SYRINGE FOR IV PUSH (FOR BLOOD PRESSURE SUPPORT)
PREFILLED_SYRINGE | INTRAVENOUS | Status: AC
  Filled 2021-11-26: qty 10

## 2021-11-26 MED ORDER — 0.9 % SODIUM CHLORIDE (POUR BTL) OPTIME
TOPICAL | Status: DC | PRN
  Administered 2021-11-26: 1000 mL

## 2021-11-26 MED ORDER — ORAL CARE MOUTH RINSE: 15.0000 mL | Freq: Once | OROMUCOSAL | Status: AC

## 2021-11-26 MED ORDER — PHENYLEPHRINE 80 MCG/ML (10ML) SYRINGE FOR IV PUSH (FOR BLOOD PRESSURE SUPPORT)
PREFILLED_SYRINGE | INTRAVENOUS | Status: DC | PRN
  Administered 2021-11-26: 120 ug via INTRAVENOUS

## 2021-11-26 MED ORDER — BUPIVACAINE HCL (PF) 0.5 % IJ SOLN
INTRAMUSCULAR | Status: AC
Start: 2021-11-26 — End: ?
  Filled 2021-11-26: qty 30

## 2021-11-26 MED ORDER — LIDOCAINE HCL (PF) 1 % IJ SOLN
INTRAMUSCULAR | Status: DC | PRN
  Administered 2021-11-26: 5 mL

## 2021-11-26 MED ORDER — POTASSIUM CHLORIDE CRYS ER 20 MEQ PO TBCR
40.0000 meq | EXTENDED_RELEASE_TABLET | Freq: Every day | ORAL | Status: DC
Start: 2021-11-26 — End: 2021-11-28
  Administered 2021-11-26 – 2021-11-28 (×3): 40 meq via ORAL
  Filled 2021-11-26 (×3): qty 2

## 2021-11-26 MED ORDER — INSULIN ASPART 100 UNIT/ML IJ SOLN: 0.0000 [IU] | INTRAMUSCULAR | Status: DC | PRN

## 2021-11-26 MED ORDER — LACTATED RINGERS IV SOLN: INTRAVENOUS | Status: DC

## 2021-11-26 SURGICAL SUPPLY — 26 items
BAG COUNTER SPONGE SURGICOUNT (BAG) ×1 IMPLANT
BNDG ELASTIC 4X5.8 VLCR STR LF (GAUZE/BANDAGES/DRESSINGS) IMPLANT
BNDG GAUZE ELAST 4 BULKY (GAUZE/BANDAGES/DRESSINGS) ×1 IMPLANT
ELECT REM PT RETURN 9FT ADLT (ELECTROSURGICAL) ×1
ELECTRODE REM PT RTRN 9FT ADLT (ELECTROSURGICAL) ×1 IMPLANT
GAUZE SPONGE 4X4 12PLY STRL (GAUZE/BANDAGES/DRESSINGS) IMPLANT
GAUZE XEROFORM 5X9 LF (GAUZE/BANDAGES/DRESSINGS) IMPLANT
GLOVE BIO SURGEON STRL SZ8 (GLOVE) ×2 IMPLANT
GOWN STRL REUS W/ TWL LRG LVL3 (GOWN DISPOSABLE) ×1 IMPLANT
GOWN STRL REUS W/ TWL XL LVL3 (GOWN DISPOSABLE) ×1 IMPLANT
GOWN STRL REUS W/TWL LRG LVL3 (GOWN DISPOSABLE) ×1
GOWN STRL REUS W/TWL XL LVL3 (GOWN DISPOSABLE) ×1
KIT BASIN OR (CUSTOM PROCEDURE TRAY) ×1 IMPLANT
NDL HYPO 25X1 1.5 SAFETY (NEEDLE) ×1 IMPLANT
NEEDLE HYPO 25X1 1.5 SAFETY (NEEDLE) ×1 IMPLANT
NS IRRIG 1000ML POUR BTL (IV SOLUTION) IMPLANT
PACK ORTHO EXTREMITY (CUSTOM PROCEDURE TRAY) ×1 IMPLANT
SOL PREP POV-IOD 4OZ 10% (MISCELLANEOUS) IMPLANT
SUCTION FRAZIER HANDLE 10FR (MISCELLANEOUS) ×1
SUCTION TUBE FRAZIER 10FR DISP (MISCELLANEOUS) ×1 IMPLANT
SUT PROLENE 3 0 PS 2 (SUTURE) ×1 IMPLANT
SWAB COLLECTION DEVICE MRSA (MISCELLANEOUS) IMPLANT
SWAB CULTURE ESWAB REG 1ML (MISCELLANEOUS) IMPLANT
SYR 10ML LL (SYRINGE) IMPLANT
TUBE CONNECTING 12X1/4 (SUCTIONS) ×1 IMPLANT
UNDERPAD 30X36 HEAVY ABSORB (UNDERPADS AND DIAPERS) ×1 IMPLANT

## 2021-11-26 NOTE — Interval H&P Note (Signed)
History and Physical Interval Note:  11/26/2021 9:53 AM  Justin [name]  has presented today for surgery, with the diagnosis of gangrene, osteomyelitis.  The various methods of treatment have been discussed with the patient and family. After consideration of risks, benefits and other options for treatment, the patient has consented to  Procedure(s): AMPUTATION TOE (Right) as a surgical intervention.  The patient's history has been reviewed, patient examined, no change in status, stable for surgery.  I have reviewed the patient's chart and labs.  Questions were answered to the patient's satisfaction.    Will plan for right 2nd toe amputation, possibly partial hallux. Patient's daughter was at bedside. No other questions and they are both ready to proceed.    Justin [name]

## 2021-11-26 NOTE — Progress Notes (Signed)
PROGRESS NOTE   Justin [name]  RJJ:[id number] DOB: Oct 17, 1934 DOA: 11/15/2021 PCP: [facility]  Brief Narrative:  87 year old white male known skilled facility resident Known CAD reflux DM TY 2 Prior stroke HLD HTN Chronic pancreatitis Subsequent ischemic stroke 0/16/0109 with metabolic encephalopathy at that admission and agitation--- he also had COVID-19 at that time  Brought to [facility] 11/15/2021 Tmax 104 as well as right foot swelling Found to have leukocytosis 16.8 normal lactic acid CRP 10 Right foot x-ray showed erosion of distal phalanx with exposed bone MRI was ordered Was also found to have a pneumonia  8/10-vascular as well as podiatry consulted-ABIs 8/10 showed moderate right lower extremity disease on right and mild disease on left 8/13  confused 8/14 EEG suggestive of cefepime induced neurotoxicity 8/15 mental status improved slightly 8/17 Diet graduated upwards--Mental status significantly improving 8/18 Angio resulting in lesions treated with Right popliteal artery drug-coated angioplasty (4x60 Inpact) Right tibioperoneal trunk angioplasty (3 x 220 mm Sterling) Right peroneal artery angioplasty (3x295m Sterling)      Hospital-Problem based course  Aspiration pneumonia in the setting of prior CVA 05/2021 graduated him to dysphagia 2 and he is tolerating this fair--watch for concerns for aspiration--completed azithromycin 8/15 Continue saline 50 cc/H Diabetic foot infection with possible osteomyelitis Continue Zosyn -- vancomycin -->linezolid 2/2 ^ cre Oxycodone preferentially to IV pain meds and Tylenol Angiogram 8/18 as above-optimized with stenting Going for amputation 8/20 under podiatry Toxic metabolic encephalopathy?  Secondary to cefepime EEG showed triphasic waves?  Secondary to cefepime induced neurotoxicity--encephalopathic pattern was felt to be more predominant per neurology Mental status improved marginally he is still  somewhat confused CT scan stable Neurontin 400 at bedtime only Mild hypokalemia Check a.m. magnesium, replace with K-Dur 40 Intertrigo of groin Started on nystatin --we will reevaluate in a.m. and then decide on discontinuation Diabetes mellitus type 2 with neuropathy CBG improved to 150s De-escalated-only use Neurontin at night Home dose of 24 units Lantus--continue Lantus 10 lower dose HLD CAD prior CVA 05/2021 Holding the majority of p.o. meds can resume aspirin statin etc. when taking all meals and when all procedures are finished CKD 3B Creatinine slightly improved today    DVT prophylaxis: Lovenox Code Status: Full Family Communication: Discussed on several days with daughter Justin [name]--at bedside this morning Disposition:  Status is: Inpatient Remains inpatient appropriate because:   Encephalopathy is improved Going for amputation 8/20 Expect return to rehab in 48 hours   Consultants:  Vascular Podiatry  Procedures: Multiple  Antimicrobials: As above   Subjective:  Still confused but overall fair Pulling at things, catheters according to nursing but redirectable   Objective: Vitals:   11/25/21 2350 11/26/21 0335 11/26/21 0806 11/26/21 0917  BP: (!) 162/71 (!) 142/69 (!) 155/70 (!) 147/75  Pulse: 70 65 72 80  Resp: '17 14 16 18  '$ Temp: 98 F (36.7 C) 98.2 F (36.8 C) 98.6 F (37 C) 97.9 F (36.6 C)  TempSrc: Oral Oral Oral Oral  SpO2: 100% 100% 99% 96%  Weight:      Height:        Intake/Output Summary (Last 24 hours) at 11/26/2021 1001 Last data filed at 11/25/2021 1847 Gross per 24 hour  Intake 540 ml  Output 400 ml  Net 140 ml    Filed Weights   11/22/21 0818 11/25/21 0629  Weight: 72.3 kg 68.3 kg    Examination:  Coherent x1 S1-S2 no murmur no rub no gallop Telemetry seems overall benign  Abdomen is soft no rebound I did not examine groin normal wounds today Chest is clear no wheeze Neurologically intact  Data Reviewed: personally  reviewed   CBC    Component Value Date/Time   WBC 15.7 (H) 11/25/2021 0204   RBC 3.12 (L) 11/25/2021 0204   HGB 9.4 (L) 11/25/2021 0204   HCT 28.0 (L) 11/25/2021 0204   PLT 339 11/25/2021 0204   MCV 89.7 11/25/2021 0204   MCV 89.5 12/31/2013 2117   MCH 30.1 11/25/2021 0204   MCHC 33.6 11/25/2021 0204   RDW 13.9 11/25/2021 0204   LYMPHSABS 2.6 11/25/2021 0204   MONOABS 1.3 (H) 11/25/2021 0204   EOSABS 0.4 11/25/2021 0204   BASOSABS 0.2 (H) 11/25/2021 0204      Latest Ref Rng & Units 11/26/2021    3:19 AM 11/25/2021    2:04 AM 11/24/2021    5:54 PM  CMP  Glucose 70 - 99 mg/dL 158  313  220   BUN 8 - 23 mg/dL '17  24  23   '$ Creatinine 0.61 - 1.24 mg/dL 1.74  1.94  1.91   Sodium 135 - 145 mmol/L 132  134  139   Potassium 3.5 - 5.1 mmol/L 3.1  3.5  3.9   Chloride 98 - 111 mmol/L 105  107  111   CO2 22 - 32 mmol/L '19  20  19   '$ Calcium 8.9 - 10.3 mg/dL 8.1  8.4  8.9   Total Protein 6.5 - 8.1 g/dL  8.0    Total Bilirubin 0.3 - 1.2 mg/dL  0.4    Alkaline Phos 38 - 126 U/L  76    AST 15 - 41 U/L  25    ALT 0 - 44 U/L  22       Radiology Studies: PERIPHERAL VASCULAR CATHETERIZATION  Result Date: 11/24/2021 DATE OF SERVICE: 11/24/2021  PATIENT:  Justin [name]  87 y.o. male  PRE-OPERATIVE DIAGNOSIS:  Atherosclerosis of native arteries of right lower extremity causing gangrene  POST-OPERATIVE DIAGNOSIS:  Same  PROCEDURE:  1) Ultrasound guided left common femoral artery access 2) Aortogram 3) Right lower extremity angiogram with third order cannulation 4) Right popliteal artery drug-coated angioplasty (4x60 Inpact) 5) Right tibioperoneal trunk angioplasty (3 x 220 mm Sterling) 6) Right peroneal artery angioplasty (3x220m Sterling)  SURGEON:  Justin Aline HStanford Breed MD  ASSISTANT: none  ANESTHESIA:   local  ESTIMATED BLOOD LOSS: minimal  LOCAL MEDICATIONS USED:  LIDOCAINE  COUNTS: confirmed correct.  PATIENT DISPOSITION:  PACU - hemodynamically stable.  Delay start of Pharmacological VTE agent  (>24hrs) due to surgical blood loss or risk of bleeding: no  INDICATION FOR PROCEDURE: Justin HINNERSis a 87y.o. male with right foot gangrene and noninvasive evidence of peripheral arterial disease.. After careful discussion of risks, benefits, and alternatives the patient was offered angiography with possible intervention. The patient understood and wished to proceed.  OPERATIVE FINDINGS: Terminal aorta and iliac arteries: Widely patent without flow-limiting stenosis  Right lower extremity: Common femoral artery: Patent without stenosis Profunda femoris artery: Patent without stenosis Superficial femoral artery: Patent without stenosis Popliteal artery: Critical behind and below-knee stenosis (greater than 90%) Anterior tibial artery: Occluded Tibioperoneal trunk: Severe stenosis (70%) Peroneal artery: Severe, multifocal stenosis (greatest 70%) Posterior tibial artery: Occluded Pedal circulation: Fills via collateralization from the peroneal artery.  Severely disadvantaged in the mid and distal foot.  GLASS score. FP 4. IP 4. Stage III. High complexity disease.  WIfI score. 2 /  1 / 0. Clinical stage III. Moderate amputation risk.  DESCRIPTION OF PROCEDURE: After identification of the patient in the pre-operative holding area, the patient was transferred to the operating room. The patient was positioned supine on the operating room table. Anesthesia was induced. The groins was prepped and draped in standard fashion. A surgical pause was performed confirming correct patient, procedure, and operative location.  The left groin was anesthetized with subcutaneous injection of 1% lidocaine. Using ultrasound guidance, the left common femoral artery was accessed with micropuncture technique. Fluoroscopy was used to confirm cannulation over the femoral head. The 32F sheath was upsized to 469F.  A Benson wire was advanced into the distal aorta. Over the wire an omni flush catheter was advanced to the level of L2. Aortogram  was performed - see above for details.  The right common iliac artery was selected with an omniflush catheter and guidewire. The wire was advanced into the common femoral artery. Over the wire the omni flush catheter was advanced into the external iliac artery. Selective angiography was performed - see above for details.  The decision was made to intervene. The patient was heparinized with 7,000 units of heparin. The 469F sheath was exchanged for a 69F x 45cm sheath. Selective angiography of the left lower extremity was performed prior to intervention.  The lesions were treated with: Right popliteal artery drug-coated angioplasty (4x60 Inpact) Right tibioperoneal trunk angioplasty (3 x 220 mm Sterling) Right peroneal artery angioplasty (3x221m Sterling)  Completion angiography revealed: Resolution of stenoses and restoration of inline flow to the ankle. Disadvantaged pedal circulation  A perclose device was used to close the arteriotomy. Hemostasis was excellent upon completion.  Upon completion of the case instrument and sharps counts were confirmed correct. The patient was transferred to the  PACU in good condition. I was present for all portions of the procedure.  PLAN: ASA '81mg'$  PO QD. Plavix '75mg'$  PO QD. High intensity statin therapy. Optimized from a vascular standpoint.  Justin Aline HStanford Breed MD [facility] CenterPhone Number: ([phone]/18/2023 5:40 PM     Scheduled Meds:  [[QHUHold] aspirin EC  81 mg Oral Daily   [MAR Hold] atorvastatin  80 mg Oral Daily   Chlorhexidine Gluconate Cloth  6 each Topical Once   [MAR Hold] clopidogrel  75 mg Oral Q breakfast   [MAR Hold] enoxaparin (LOVENOX) injection  30 mg Subcutaneous Daily   [MAR Hold] fluticasone furoate-vilanterol  1 puff Inhalation Daily   [MAR Hold] insulin aspart  0-5 Units Subcutaneous QHS   [MAR Hold] insulin aspart  0-9 Units Subcutaneous TID WC   [MAR Hold] insulin glargine-yfgn  10 Units Subcutaneous  Daily   [MAR Hold] nystatin   Topical TID   [MAR Hold] pantoprazole (PROTONIX) IV  40 mg Intravenous Q24H   [MAR Hold] potassium chloride  40 mEq Oral Daily   [MAR Hold] sodium chloride flush  3 mL Intravenous Q12H   [MAR Hold] sodium chloride flush  3 mL Intravenous Q12H   Continuous Infusions:  sodium chloride 50 mL/hr at 11/25/21 1629   [MAR Hold] sodium chloride     lactated ringers 10 mL/hr at 11/26/21 1000   [MAR Hold] linezolid (ZYVOX) IV 600 mg (11/25/21 2118)   [MAR Hold] piperacillin-tazobactam (ZOSYN)  IV 3.375 g (11/26/21 0547)     LOS: 10 days   Time spent: 4[place] MD [facility] To contact the attending provider between 7A-7P or the covering provider during  after hours 7P-7A, please log into the web site [email or web address] and access using universal [place] password for that web site. If you do not have the password, please call the hospital operator.  11/26/2021, 10:01 AM

## 2021-11-26 NOTE — Anesthesia Postprocedure Evaluation (Signed)
Anesthesia Post Note  Patient: Justin [name]  Procedure(s) Performed: AMPUTATION TOE (Right: Toe)     Patient location during evaluation: PACU Anesthesia Type: General Level of consciousness: awake Pain management: pain level controlled Vital Signs Assessment: post-procedure vital signs reviewed and stable Respiratory status: spontaneous breathing, nonlabored ventilation, respiratory function stable and patient connected to nasal cannula oxygen Cardiovascular status: stable and blood pressure returned to baseline Postop Assessment: no apparent nausea or vomiting Anesthetic complications: no   No notable events documented.  Last Vitals:  Vitals:   11/26/21 1249 11/26/21 1533  BP: (!) 167/81 (!) 152/70  Pulse: 74 73  Resp: 15 16  Temp: 36.5 C 36.5 C  SpO2: 100% 100%    Last Pain:  Vitals:   11/26/21 1533  TempSrc: Oral  PainSc:                  Neddie Steedman P Brianah Hopson

## 2021-11-26 NOTE — Progress Notes (Signed)
PT Cancellation Note  Patient Details Name: HARLIE RAGLE MRN: [id number] DOB: 1934/09/15   Cancelled Treatment:    Reason Eval/Treat Not Completed: Patient at procedure or test/unavailable; patient in OR for R toe amputations.  Will continue attempts.    Reginia Naas 11/26/2021, 9:41 AM Magda Kiel, PT Acute Rehabilitation Services Office:[phone] 11/26/2021

## 2021-11-26 NOTE — Anesthesia Procedure Notes (Signed)
Procedure Name: MAC Date/Time: 11/26/2021 10:41 AM  Performed by: Janene Harvey, CRNAPre-anesthesia Checklist: Patient identified, Emergency Drugs available, Suction available and Patient being monitored Patient Re-evaluated:Patient Re-evaluated prior to induction Oxygen Delivery Method: Simple face mask Induction Type: IV induction Placement Confirmation: positive ETCO2 Dental Injury: Teeth and Oropharynx as per pre-operative assessment

## 2021-11-26 NOTE — Social Work (Addendum)
  CSW was alerted by pt's RN that pt's daughter Emma(POA) wanted to speak with CSW. CSW called [name] and she explained that she wants pt in another LTC facility. CSW explained that the facility that pt is at should assist with facilitating that transfer. In speaking with [name], it was noted that pt may need to have rehab. CSW noted awaiting PT recs. CSW inquired if pt had medicaid due to it not noted on his face sheet. [name] states that she thinks that pt is private paying for the facility. CSW asked [name] to confirm that information because facility may have more private pay beds then LTC beds. [name] mentioned pt going to [facility].

## 2021-11-26 NOTE — Brief Op Note (Signed)
11/26/2021  11:23 AM  PATIENT:  Justin [name]  87 y.o. male  PRE-OPERATIVE DIAGNOSIS:  gangrene, osteomyelitis  POST-OPERATIVE DIAGNOSIS:  gangrene, osteomyelitis  PROCEDURE:  Procedure(s): AMPUTATION TOE (Right)  SURGEON:  Surgeon(s) and Role:    * [name], DPM - Primary  PHYSICIAN ASSISTANT:   ASSISTANTS: none   ANESTHESIA:   MAC  EBL:  20 mL   BLOOD ADMINISTERED:none  DRAINS: none   LOCAL MEDICATIONS USED:  MARCAINE   , BUPIVICAINE , and Amount: 10 ml  SPECIMEN:  Source of Specimen:  right 2nd toe for pathology and wound culture  DISPOSITION OF SPECIMEN:  PATHOLOGY  COUNTS:  YES  TOURNIQUET:  * No tourniquets in log *  DICTATION: .Dragon Dictation  PLAN OF CARE: Admit to inpatient   PATIENT DISPOSITION:  PACU - hemodynamically stable.   Delay start of Pharmacological VTE agent (>24hrs) due to surgical blood loss or risk of bleeding: no  Intraoperative findings: S/p right 2nd toe amputation. Purulence noted and culture obtained. Metatarsal head appeared viable. No proximal tracking. Hemostasis acheived and wound closed.  Debrided callus/nail on the right hallux and there is no open wound, drainage. No swelling or redness/signs of infection so I did not proceed with amputation of the toe. There is macerated tissue along the interspaces and some mild hyperpigmented changes to the right 4th and 5th toe that needs to be monitored closely. He may eventually need a TMA depending on how he heals.

## 2021-11-26 NOTE — Transfer of Care (Signed)
Immediate Anesthesia Transfer of Care Note  Patient: Justin [name]  Procedure(s) Performed: AMPUTATION TOE (Right: Toe)  Patient Location: PACU  Anesthesia Type:MAC  Level of Consciousness: drowsy and patient cooperative  Airway & Oxygen Therapy: Patient Spontanous Breathing and Patient connected to face mask oxygen  Post-op Assessment: Report given to RN and Post -op Vital signs reviewed and stable  Post vital signs: Reviewed and stable  Last Vitals:  Vitals Value Taken Time  BP 139/75 11/26/21 1133  Temp    Pulse 70 11/26/21 1135  Resp 21 11/26/21 1138  SpO2 100 % 11/26/21 1135  Vitals shown include unvalidated device data.  Last Pain:  Vitals:   11/26/21 0917  TempSrc: Oral  PainSc:          Complications: No notable events documented.

## 2021-11-26 NOTE — Anesthesia Preprocedure Evaluation (Addendum)
Anesthesia Evaluation  Patient identified by MRN, date of birth, ID band Patient awake    Reviewed: Allergy & Precautions, NPO status , Patient's Chart, lab work & pertinent test results  Airway Mallampati: II  TM Distance: >3 FB Neck ROM: Full    Dental  (+) Edentulous Upper, Edentulous Lower   Pulmonary former smoker,    Pulmonary exam normal        Cardiovascular hypertension, Normal cardiovascular exam     Neuro/Psych  Headaches, TIACVA negative psych ROS   GI/Hepatic negative GI ROS, Neg liver ROS,   Endo/Other  diabetes, Insulin Dependent  Renal/GU CRFRenal disease     Musculoskeletal  (+) Arthritis ,   Abdominal   Peds  Hematology  (+) Blood dyscrasia, anemia ,   Anesthesia Other Findings gangrene  osteomyelitis  Reproductive/Obstetrics                             Anesthesia Physical Anesthesia Plan  ASA: 3  Anesthesia Plan: MAC   Post-op Pain Management:    Induction: Intravenous  PONV Risk Score and Plan: 1 and Ondansetron, Dexamethasone, Propofol infusion and Treatment may vary due to age or medical condition  Airway Management Planned: Simple Face Mask  Additional Equipment:   Intra-op Plan:   Post-operative Plan:   Informed Consent: I have reviewed the patients History and Physical, chart, labs and discussed the procedure including the risks, benefits and alternatives for the proposed anesthesia with the patient or authorized representative who has indicated his/her understanding and acceptance.   Patient has DNR.  Discussed DNR with power of attorney, Discussed DNR with patient and Suspend DNR.   Consent reviewed with POA  Plan Discussed with: CRNA  Anesthesia Plan Comments:        Anesthesia Quick Evaluation

## 2021-11-27 ENCOUNTER — Encounter (HOSPITAL_COMMUNITY): Payer: Self-pay | Admitting: Podiatry

## 2021-11-27 DIAGNOSIS — E11628 Type 2 diabetes mellitus with other skin complications: Secondary | ICD-10-CM | POA: Diagnosis not present

## 2021-11-27 DIAGNOSIS — L089 Local infection of the skin and subcutaneous tissue, unspecified: Secondary | ICD-10-CM | POA: Diagnosis not present

## 2021-11-27 LAB — BASIC METABOLIC PANEL
Anion gap: 8 (ref 5–15)
BUN: 14 mg/dL (ref 8–23)
CO2: 22 mmol/L (ref 22–32)
Calcium: 8.2 mg/dL — ABNORMAL LOW (ref 8.9–10.3)
Chloride: 104 mmol/L (ref 98–111)
Creatinine, Ser: 1.74 mg/dL — ABNORMAL HIGH (ref 0.61–1.24)
GFR, Estimated: 38 mL/min — ABNORMAL LOW (ref >60)
Glucose, Bld: 161 mg/dL — ABNORMAL HIGH (ref 70–99)
Potassium: 4 mmol/L (ref 3.5–5.1)
Sodium: 134 mmol/L — ABNORMAL LOW (ref 135–145)

## 2021-11-27 LAB — GLUCOSE, CAPILLARY
Glucose-Capillary: 164 mg/dL — ABNORMAL HIGH (ref 70–99)
Glucose-Capillary: 233 mg/dL — ABNORMAL HIGH (ref 70–99)
Glucose-Capillary: 149 mg/dL — ABNORMAL HIGH (ref 70–99)
Glucose-Capillary: 142 mg/dL — ABNORMAL HIGH (ref 70–99)

## 2021-11-27 LAB — MAGNESIUM: Magnesium: 1.6 mg/dL — ABNORMAL LOW (ref 1.7–2.4)

## 2021-11-27 MED ORDER — PANTOPRAZOLE SODIUM 40 MG PO TBEC
40.0000 mg | DELAYED_RELEASE_TABLET | Freq: Every day | ORAL | Status: DC
  Administered 2021-11-27 – 2021-11-28 (×2): 40 mg via ORAL
  Filled 2021-11-27 (×2): qty 1

## 2021-11-27 MED ORDER — MAGNESIUM OXIDE -MG SUPPLEMENT 400 (240 MG) MG PO TABS
400.0000 mg | ORAL_TABLET | Freq: Two times a day (BID) | ORAL | Status: DC
  Administered 2021-11-27 – 2021-11-28 (×3): 400 mg via ORAL
  Filled 2021-11-27 (×3): qty 1

## 2021-11-27 MED ORDER — WHITE PETROLATUM EX OINT
TOPICAL_OINTMENT | CUTANEOUS | Status: DC | PRN
Start: 2021-11-27 — End: 2021-11-28
  Administered 2021-11-27: 0.2 via TOPICAL
  Filled 2021-11-27: qty 28.35

## 2021-11-27 MED FILL — Heparin Sodium (Porcine) Inj 1000 Unit/ML: INTRAMUSCULAR | Qty: 10 | Status: AC

## 2021-11-27 NOTE — Progress Notes (Signed)
Orthopedic Tech Progress Note Patient Details:  Justin [name] 12/19/1934 [id number]  Ortho Devices Type of Ortho Device: Darco shoe Ortho Device/Splint Location: RLE Ortho Device/Splint Interventions: Ordered  Left DARCO shoe on bedside table, waiting for therapy.   Post Interventions Patient Tolerated: Well Instructions Provided: Care of device  Arville Go 11/27/2021, 9:47 AM

## 2021-11-27 NOTE — Progress Notes (Signed)
PROGRESS NOTE   Justin [name]  NID:[id number] DOB: 03/04/1935 DOA: 11/15/2021 PCP: [facility]  Brief Narrative:  87 year old white male known skilled facility resident Known CAD reflux DM TY 2 Prior stroke HLD HTN Chronic pancreatitis Subsequent ischemic stroke 1/44/3154 with metabolic encephalopathy at that admission and agitation--- he also had COVID-19 at that time  Brought to [facility] 11/15/2021 Tmax 104 as well as right foot swelling Found to have leukocytosis 16.8 normal lactic acid CRP 10 Right foot x-ray showed erosion of distal phalanx with exposed bone MRI was ordered Was also found to have a pneumonia  8/10-vascular as well as podiatry consulted-ABIs 8/10 showed moderate right lower extremity disease on right and mild disease on left 8/13  confused 8/14 EEG suggestive of cefepime induced neurotoxicity 8/15 mental status improved slightly 8/17 Diet graduated upwards--Mental status significantly improving 8/18 Angio resulting in lesions treated with Right popliteal artery drug-coated angioplasty (4x60 Inpact) Right tibioperoneal trunk angioplasty (3 x 220 mm Sterling) Right peroneal artery angioplasty (3x258m Sterling)  8/20 right second toe amputation with metatarsal head being viable   Hospital-Problem based course  Aspiration pneumonia in the setting of prior CVA 05/2021 graduated him to dysphagia 2 and he is tolerating this fair--watch for concerns for aspiration--completed azithromycin 8/15 DC IV fluids today Diabetic foot infection with possible osteomyelitis Continue Zosyn -- vancomycin -->linezolid 2/2 ^ cre, stop antibiotics on 8/21 and reassess Oxycodone preferentially to IV pain meds and Tylenol Angiogram 8/18 as above-optimized with stenting--right toe amputated 8/20 Still some concerns for possible breakdown of the rest of the foot so monitor overnight off antibiotics and see Will need outpatient podiatry follow-up Toxic metabolic  encephalopathy?  Secondary to cefepime EEG showed triphasic waves?  Secondary to cefepime induced neurotoxicity--encephalopathic pattern was felt to be more predominant per neurology Mental status --CT scan stable Neurontin 400 at bedtime only Mild hypokalemia Potassium is replaced however magnesium is quite low so start repletion with Mag-Ox 400 twice daily Intertrigo of groin Started on nystatin --we will reevaluate in a.m. and then decide on discontinuation Diabetes mellitus type 2 with neuropathy CBG improved to 150--160 range De-escalated-only use Neurontin at night Home dose of 24 units Lantus--continue Lantus 10 lower dose HLD CAD prior CVA 05/2021 Holding the majority of p.o. meds can resume aspirin statin etc. when taking all meals and when all procedures are finished CKD 3B Creatinine slightly improved today    DVT prophylaxis: Lovenox Code Status: Full Family Communication: No family present today Disposition:  Status is: Inpatient Remains inpatient appropriate because:   Encephalopathy is improved Going for amputation 8/20 Expect return to rehab in 48 hours   Consultants:  Vascular Podiatry  Procedures: Multiple  Antimicrobials: As above   Subjective:  Clear-states that right lower extremity has pain in the toe This is moderate Wound was not examined today   Objective: Vitals:   11/26/21 2019 11/26/21 2349 11/27/21 0336 11/27/21 0826  BP: 136/70 (!) 149/78 (!) 147/70 (!) 149/78  Pulse: 82 85 81 72  Resp: '16 16 20 17  '$ Temp: 98.5 F (36.9 C) 98.4 F (36.9 C) 98.4 F (36.9 C) 98.1 F (36.7 C)  TempSrc: Oral Oral Oral Oral  SpO2: 99% 95% 97% 98%  Weight:      Height:        Intake/Output Summary (Last 24 hours) at 11/27/2021 1048 Last data filed at 11/27/2021 0645 Gross per 24 hour  Intake 1220 ml  Output 1620 ml  Net -400 ml  Filed Weights   11/22/21 0818 11/25/21 0629  Weight: 72.3 kg 68.3 kg    Examination:  Coherent more  alert S1-S2 no murmur sinus rhythm Chest is clear no added sound no rhonchi Abdomen soft Wound not examined on right lower extremity  Data Reviewed: personally reviewed   CBC    Component Value Date/Time   WBC 15.7 (H) 11/25/2021 0204   RBC 3.12 (L) 11/25/2021 0204   HGB 9.4 (L) 11/25/2021 0204   HCT 28.0 (L) 11/25/2021 0204   PLT 339 11/25/2021 0204   MCV 89.7 11/25/2021 0204   MCV 89.5 12/31/2013 2117   MCH 30.1 11/25/2021 0204   MCHC 33.6 11/25/2021 0204   RDW 13.9 11/25/2021 0204   LYMPHSABS 2.6 11/25/2021 0204   MONOABS 1.3 (H) 11/25/2021 0204   EOSABS 0.4 11/25/2021 0204   BASOSABS 0.2 (H) 11/25/2021 0204      Latest Ref Rng & Units 11/27/2021    8:20 AM 11/26/2021    3:19 AM 11/25/2021    2:04 AM  CMP  Glucose 70 - 99 mg/dL 161  158  313   BUN 8 - 23 mg/dL '14  17  24   '$ Creatinine 0.61 - 1.24 mg/dL 1.74  1.74  1.94   Sodium 135 - 145 mmol/L 134  132  134   Potassium 3.5 - 5.1 mmol/L 4.0  3.1  3.5   Chloride 98 - 111 mmol/L 104  105  107   CO2 22 - 32 mmol/L '22  19  20   '$ Calcium 8.9 - 10.3 mg/dL 8.2  8.1  8.4   Total Protein 6.5 - 8.1 g/dL   8.0   Total Bilirubin 0.3 - 1.2 mg/dL   0.4   Alkaline Phos 38 - 126 U/L   76   AST 15 - 41 U/L   25   ALT 0 - 44 U/L   22      Radiology Studies: DG Foot 2 Views Right  Result Date: 11/26/2021 CLINICAL DATA:  Status post amputation EXAM: RIGHT FOOT - 2 VIEW COMPARISON:  11/15/2021 FINDINGS: Status post right second digital amputation with expected overlying postoperative change. Redemonstrated sclerotic, chronically eroded appearance of the tip of the right great toe. No fracture or dislocation. Extensive vascular calcinosis. IMPRESSION: Status post right second digital amputation with expected overlying postoperative change. Electronically Signed   By: Delanna Ahmadi M.D.   On: 11/26/2021 12:31     Scheduled Meds:  aspirin EC  81 mg Oral Daily   atorvastatin  80 mg Oral Daily   clopidogrel  75 mg Oral Q breakfast    enoxaparin (LOVENOX) injection  30 mg Subcutaneous Daily   fluticasone furoate-vilanterol  1 puff Inhalation Daily   insulin aspart  0-5 Units Subcutaneous QHS   insulin aspart  0-9 Units Subcutaneous TID WC   insulin glargine-yfgn  10 Units Subcutaneous Daily   nystatin   Topical TID   pantoprazole  40 mg Oral Daily   potassium chloride  40 mEq Oral Daily   sodium chloride flush  3 mL Intravenous Q12H   sodium chloride flush  3 mL Intravenous Q12H   Continuous Infusions:  sodium chloride 50 mL/hr at 11/27/21 0645   sodium chloride     linezolid (ZYVOX) IV 600 mg (11/27/21 0943)   piperacillin-tazobactam (ZOSYN)  IV 3.375 g (11/27/21 0626)     LOS: 11 days   Time spent: [place], MD [facility] To contact the attending provider between  7A-7P or the covering provider during after hours 7P-7A, please log into the web site [email or web address] and access using universal [place] password for that web site. If you do not have the password, please call the hospital operator.  11/27/2021, 10:48 AM

## 2021-11-27 NOTE — Progress Notes (Signed)
Subjective: 1 Day Post-Op Procedure(s) (LRB): AMPUTATION TOE (Right) POV RT second toe amputation.  DOS: 11/26/2021.  Patient states that he is feeling well.  Resting comfortably in bed today upon presentation.  Objective: Vital signs in last 24 hours: Temp:  [97.7 F (36.5 C)-98.5 F (36.9 C)] 98.1 F (36.7 C) (08/21 0826) Pulse Rate:  [72-85] 72 (08/21 0826) Resp:  [16-20] 17 (08/21 0826) BP: (136-152)/(70-78) 149/78 (08/21 0826) SpO2:  [95 %-100 %] 98 % (08/21 0826)  Recent Labs    11/24/21 1754 11/25/21 0204  HGB 11.2* 9.4*   Recent Labs    11/24/21 1754 11/25/21 0204  WBC 15.2* 15.7*  RBC 3.83* 3.12*  HCT 34.5* 28.0*  PLT 405* 339   Recent Labs    11/26/21 0319 11/27/21 0820  NA 132* 134*  K 3.1* 4.0  CL 105 104  CO2 19* 22  BUN 17 14  CREATININE 1.74* 1.74*  GLUCOSE 158* 161*  CALCIUM 8.1* 8.2*      Skin incision/amputation site well coapted sutures intact.  No bleeding or drainage.  Please see above noted photo   Assessment/Plan: 1 Day Post-Op Procedure(s) (LRB): AMPUTATION TOE (Right) -Dressings changed.  Clean and dry until follow-up outpatient in office -Weightbearing as tolerated in the Darco wedge shoe -Okay to discharge from a podiatry/surgical standpoint -Podiatry to sign off   Edrick Kins 11/27/2021, 2:18 PM

## 2021-11-27 NOTE — Evaluation (Signed)
Occupational Therapy Evaluation Patient Details Name: Justin [name] MRN: [id number] DOB: May 02, 1934 Today's Date: 11/27/2021   History of Present Illness Pt is an 87 y.o. male LTC resident at [facility] admitted on 11/15/21 with worsening RLE/R foot pain, AMS. Workup for R foot diatbetic infection, suspected PNA. EEG 8/13 most consistent with generalized nonspecific cerebral dysfunction. Angiogram for 8/15 cancelled due to worsening AMS. S/p RLE angiogram with multiple angioplasties on 8/18. 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 PMH includes dementia, CVAs (most recently 05/2021, residual R-side weakness), CAD, DM2, HTN, chronic pancreatitis, CKD.   Clinical Impression   PTA, pt is a LTC resident, nonambulatory for approx 2 years per chart. Pt unable to give accurate account of PLOF vs current functional abilities. Pt presents with delayed motor planning, memory deficits and inconsistent command following. Overall, pt requires Max A x 2 for standing trials w/ R lateral lean, Min A for UB ADL and overall max-total A for LB ADLs. Will follow acutely to maximize safety and independence with daily tasks and defer any follow-up therapy needs to pt's LTC facility.       Recommendations for follow up therapy are one component of a multi-disciplinary discharge planning process, led by the attending physician.  Recommendations may be updated based on patient status, additional functional criteria and insurance authorization.   Follow Up Recommendations  Skilled nursing-short term rehab (<3 hours/day)    Assistance Recommended at Discharge Frequent or constant Supervision/Assistance  Patient can return home with the following Two people to help with walking and/or transfers;A lot of help with bathing/dressing/bathroom    Functional Status Assessment  Patient has had a recent decline in their functional status and demonstrates the ability to make significant improvements in function in a reasonable and  predictable amount of time.  Equipment Recommendations  None recommended by OT    Recommendations for Other Services       Precautions / Restrictions Precautions Precautions: Fall;Other (comment) Precaution Comments: bladder/bowel incontinence Required Braces or Orthoses: Other Brace Other Brace: darco shoe Restrictions Weight Bearing Restrictions: Yes RLE Weight Bearing: Weight bearing as tolerated Other Position/Activity Restrictions: in post-op shoe      Mobility Bed Mobility Overal bed mobility: Needs Assistance Bed Mobility: Supine to Sit, Sit to Supine, Rolling Rolling: Mod assist   Supine to sit: Mod assist, +2 for physical assistance, HOB elevated Sit to supine: Max assist, +2 for physical assistance   General bed mobility comments: mod-maxA+2 for trunk and BLE management with supine<>sit; pt modA to roll towards R-side for bedpan placement, able to maintain sidelying without assist    Transfers Overall transfer level: Needs assistance Equipment used: Rolling walker (2 wheels) Transfers: Sit to/from Stand Sit to Stand: Max assist, +2 physical assistance, From elevated surface           General transfer comment: performed 2x standing from EOB to RW, pt requiring maxA+2 with difficulty keeping BUE support on RW, difficulty achieving fully upright standing, minimal weight through R foot keeping knee extended      Balance Overall balance assessment: Needs assistance Sitting-balance support: No upper extremity supported Sitting balance-Leahy Scale: Fair     Standing balance support: During functional activity, Bilateral upper extremity supported Standing balance-Leahy Scale: Zero Standing balance comment: maxA+2 to maintain near fully static standing with posterior lean, dependent for pericare                           ADL either performed  or assessed with clinical judgement   ADL Overall ADL's : Needs assistance/impaired Eating/Feeding: Set  up;Sitting   Grooming: Supervision/safety;Sitting   Upper Body Bathing: Minimal assistance;Sitting   Lower Body Bathing: Maximal assistance;Sitting/lateral leans;Bed level   Upper Body Dressing : Minimal assistance;Sitting   Lower Body Dressing: Maximal assistance;Sitting/lateral leans;Bed level       Toileting- Clothing Manipulation and Hygiene: Total assistance;Bed level Toileting - Clothing Manipulation Details (indicate cue type and reason): bowel incontinence noted, Total A for cleanup in standing (+2 in standing) and bed level       General ADL Comments: Unsure how close pt is to baseline d/t poor historian though overall stiff and delayed motor planning noted requiring increased assist for all LB ADLs - poor tolerance to standing attempts     Vision Ability to See in Adequate Light: 0 Adequate Patient Visual Report: No change from baseline Vision Assessment?: No apparent visual deficits     Perception     Praxis      Pertinent Vitals/Pain Pain Assessment Pain Assessment: Faces Faces Pain Scale: Hurts little more Pain Location: BLEs with movement Pain Descriptors / Indicators: Sore, Guarding, Grimacing, Moaning Pain Intervention(s): Monitored during session     Hand Dominance Right   Extremity/Trunk Assessment Upper Extremity Assessment Upper Extremity Assessment: RUE deficits/detail RUE Deficits / Details: h/o residual R-side weakness since prior stroke; noted BUE/BLE flexor contractures/stiffness   Lower Extremity Assessment Lower Extremity Assessment: Defer to PT evaluation RLE Deficits / Details: h/o residual R-side weakness since prior stroke; noted BUE/BLE flexor contractures/stiffness LLE Deficits / Details: noted BUE/BLE flexor contractures/stiffness   Cervical / Trunk Assessment Cervical / Trunk Assessment: Kyphotic   Communication Communication Communication: Expressive difficulties;HOH   Cognition Arousal/Alertness: Awake/alert Behavior  During Therapy: Flat affect Overall Cognitive Status: History of cognitive impairments - at baseline                                 General Comments: h/o dementia, unsure residual cognitive deficits from stroke 05/2021. flat affect, poor attention, memory and motor planning noted. pt oriented to [facility], reports here to work with therapy, then states sx on both legs; year 2018; aware he lives in resting home, aware stroke affected R-side. following simple commands with increased time and repeated cues     General Comments  Secure chat sent to MD to inquire on WB/orthotic recs    Exercises     Shoulder Instructions      Home Living Family/patient expects to be discharged to:: Skilled nursing facility                                 Additional Comments: pt LTC resident at [facility]      Prior Functioning/Environment Prior Level of Function : Needs assist;Patient poor historian/Family not available             Mobility Comments: From PT Eval note 05/2021, daughter reports pt has not ambulated in ~2 yrs but unsure if pt transfering without assist, uses w/c, was dressing himself; unsure of mobility since stroke, no family present on eval today ADLs Comments: Pt previously reports that he dresses himself though today reports assistance needed for socks.        OT Problem List: Decreased strength;Decreased activity tolerance;Impaired balance (sitting and/or standing);Decreased cognition;Decreased safety awareness;Decreased knowledge of use of DME or AE;Decreased  knowledge of precautions;Pain      OT Treatment/Interventions: Self-care/ADL training;Therapeutic exercise;Energy conservation;DME and/or AE instruction;Therapeutic activities;Patient/family education;Balance training    OT Goals(Current goals can be found in the care plan section) Acute Rehab OT Goals Patient Stated Goal: unable to state but did express desire to be placed on bedpan  at end of session OT Goal Formulation: Patient unable to participate in goal setting Time For Goal Achievement: 12/11/21 Potential to Achieve Goals: Good  OT Frequency: Min 2X/week    Co-evaluation PT/OT/SLP Co-Evaluation/Treatment: Yes Reason for Co-Treatment: For patient/therapist safety;To address functional/ADL transfers;Necessary to address cognition/behavior during functional activity PT goals addressed during session: Mobility/safety with mobility;Balance OT goals addressed during session: ADL's and self-care;Strengthening/ROM      AM-PAC OT "6 Clicks" Daily Activity     Outcome Measure Help from another person eating meals?: A Little Help from another person taking care of personal grooming?: A Little Help from another person toileting, which includes using toliet, bedpan, or urinal?: Total Help from another person bathing (including washing, rinsing, drying)?: A Lot Help from another person to put on and taking off regular upper body clothing?: A Little Help from another person to put on and taking off regular lower body clothing?: A Lot 6 Click Score: 14   End of Session Equipment Utilized During Treatment: Gait belt;Rolling walker (2 wheels) Nurse Communication: Mobility status;Other (comment) (discussed with NT, pt on bedpan)  Activity Tolerance: Patient tolerated treatment well Patient left: in bed;with call bell/phone within reach;with bed alarm set  OT Visit Diagnosis: Other abnormalities of gait and mobility (R26.89);Unsteadiness on feet (R26.81)                Time: 8916-9450 OT Time Calculation (min): 18 min Charges:  OT General Charges $OT Visit: 1 Visit OT Evaluation $OT Eval Moderate Complexity: 1 Mod  Malachy Chamber, OTR/L Acute Rehab Services Office: [phone]   Layla Maw 11/27/2021, 10:34 AM

## 2021-11-27 NOTE — Evaluation (Signed)
Physical Therapy Evaluation Patient Details Name: Justin [name] MRN: [id number] DOB: 1935/03/16 Today's Date: 11/27/2021  History of Present Illness  Pt is an 87 y.o. male LTC resident at [facility] admitted on 11/15/21 with worsening RLE/R foot pain, AMS. Workup for R foot diatbetic infection, suspected PNA. EEG 8/13 most consistent with generalized nonspecific cerebral dysfunction. Angiogram for 8/15 cancelled due to worsening AMS. S/p RLE angiogram with multiple angioplasties on 8/18. 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 PMH includes dementia, CVAs (most recently 05/2021, residual R-side weakness), CAD, DM2, HTN, chronic pancreatitis, CKD.   Clinical Impression  Pt presents with an overall decrease in functional mobility secondary to above. Pt with h/o dementia and poor historian, reports he lives in rest home and knows he is at [facility]; per chart, pt LTC resident at [facility], from admission 05/2021 pt has not been ambulatory >/2 yrs. Today, pt requiring maxA+2 for standing trials, dependent for pericare due to incontinence. Pt pleasant, slow to follow commands. Unsure how current mobility compares to baseline assist needs at LTC, may need to consider SNF-level therapies to maximize functional mobility and decrease caregiver burden, versus return to LTC.   Recommendations for follow up therapy are one component of a multi-disciplinary discharge planning process, led by the attending physician.  Recommendations may be updated based on patient status, additional functional criteria and insurance authorization.  Follow Up Recommendations Skilled nursing-short term rehab (<3 hours/day) Can patient physically be transported by private vehicle: No    Assistance Recommended at Discharge Frequent or constant Supervision/Assistance  Patient can return home with the following  A lot of help with walking and/or transfers;A lot of help with bathing/dressing/bathroom    Equipment  Recommendations  (defer to next venue)  Recommendations for Other Services       Functional Status Assessment Patient has had a recent decline in their functional status and demonstrates the ability to make significant improvements in function in a reasonable and predictable amount of time.     Precautions / Restrictions Precautions Precautions: Fall;Other (comment) Precaution Comments: bladder/bowel incontinence Restrictions Weight Bearing Restrictions: Yes RLE Weight Bearing: Weight bearing as tolerated Other Position/Activity Restrictions: in post-op shoe      Mobility  Bed Mobility Overal bed mobility: Needs Assistance Bed Mobility: Supine to Sit, Sit to Supine, Rolling Rolling: Mod assist   Supine to sit: Mod assist, +2 for physical assistance, HOB elevated Sit to supine: Max assist, +2 for physical assistance   General bed mobility comments: mod-maxA+2 for trunk and BLE management with supine<>sit; pt modA to roll towards R-side for bedpan placement, able to maintain sidelying without assist    Transfers Overall transfer level: Needs assistance Equipment used: Rolling walker (2 wheels) Transfers: Sit to/from Stand Sit to Stand: Max assist, +2 physical assistance, From elevated surface           General transfer comment: performed 2x standing from EOB to RW, pt requiring maxA+2 with difficulty keeping BUE support on RW, difficulty achieving fully upright standing, minimal weight through R foot keeping knee extended    Ambulation/Gait               General Gait Details: unable  Stairs            Wheelchair Mobility    Modified Rankin (Stroke Patients Only)       Balance Overall balance assessment: Needs assistance Sitting-balance support: No upper extremity supported Sitting balance-Leahy Scale: Fair Sitting balance - Comments: quick progression from  modA to min guard for seated balance   Standing balance support: During functional activity,  Bilateral upper extremity supported Standing balance-Leahy Scale: Zero Standing balance comment: maxA+2 to maintain near fully static standing with posterior lean, dependent for pericare                             Pertinent Vitals/Pain Pain Assessment Pain Assessment: Faces Faces Pain Scale: Hurts little more Pain Location: BLEs with movement Pain Descriptors / Indicators: Sore, Guarding, Grimacing, Moaning Pain Intervention(s): Monitored during session, Limited activity within patient's tolerance, Repositioned    Home Living Family/patient expects to be discharged to:: Skilled nursing facility                   Additional Comments: pt LTC resident at [facility]    Prior Function Prior Level of Function : Needs assist             Mobility Comments: From PT Eval note 05/2021, daughter reports pt has not ambulated in ~2 yrs but unsure if pt transfering without assist, uses w/c, was dressing himself; unsure of mobility since stroke, no family present on eval today       Hand Dominance        Extremity/Trunk Assessment   Upper Extremity Assessment Upper Extremity Assessment: Generalized weakness;RUE deficits/detail RUE Deficits / Details: h/o residual R-side weakness since prior stroke; noted BUE/BLE flexor contractures/stiffness    Lower Extremity Assessment Lower Extremity Assessment: Generalized weakness;RLE deficits/detail;LLE deficits/detail RLE Deficits / Details: h/o residual R-side weakness since prior stroke; noted BUE/BLE flexor contractures/stiffness LLE Deficits / Details: noted BUE/BLE flexor contractures/stiffness       Communication   Communication: Expressive difficulties;HOH  Cognition Arousal/Alertness: Awake/alert Behavior During Therapy: Flat affect Overall Cognitive Status: History of cognitive impairments - at baseline                                 General Comments: h/o dementia, unsure residual cognitive  deficits from stroke 05/2021. flat affect, poor attention, memory and motor planning noted. pt oriented to [facility], reports here to work with therapy, then states sx on both legs; year 2018; aware he lives in resting home, aware stroke affected R-side. following simple commands with increased time and repeated cues        General Comments      Exercises     Assessment/Plan    PT Assessment Patient needs continued PT services  PT Problem List Decreased strength;Decreased range of motion;Decreased activity tolerance;Decreased balance;Decreased mobility;Decreased cognition;Decreased knowledge of use of DME;Decreased safety awareness;Decreased knowledge of precautions;Impaired sensation;Impaired tone;Pain       PT Treatment Interventions DME instruction;Functional mobility training;Therapeutic activities;Therapeutic exercise;Balance training;Patient/family education;Cognitive remediation;Wheelchair mobility training    PT Goals (Current goals can be found in the Care Plan section)  Acute Rehab PT Goals Patient Stated Goal: decreased pain PT Goal Formulation: With patient Time For Goal Achievement: 12/11/21 Potential to Achieve Goals: Fair    Frequency Min 2X/week     Co-evaluation PT/OT/SLP Co-Evaluation/Treatment: Yes Reason for Co-Treatment: Necessary to address cognition/behavior during functional activity;For patient/therapist safety;To address functional/ADL transfers (dovetail turned co-eval due to AMS and significant assist needed) PT goals addressed during session: Mobility/safety with mobility;Balance         AM-PAC PT "6 Clicks" Mobility  Outcome Measure Help needed turning from your back to your side while in  a flat bed without using bedrails?: A Lot Help needed moving from lying on your back to sitting on the side of a flat bed without using bedrails?: Total Help needed moving to and from a bed to a chair (including a wheelchair)?: Total Help needed  standing up from a chair using your arms (e.g., wheelchair or bedside chair)?: Total Help needed to walk in hospital room?: Total Help needed climbing 3-5 steps with a railing? : Total 6 Click Score: 7    End of Session Equipment Utilized During Treatment: Gait belt Activity Tolerance: Patient tolerated treatment well;Patient limited by pain Patient left: in bed;with call bell/phone within reach;with bed alarm set;Other (comment) (on bedpan) Nurse Communication: Mobility status;Other (comment) (pt on bedpan) PT Visit Diagnosis: Other abnormalities of gait and mobility (R26.89);Muscle weakness (generalized) (M62.81);Pain    Time: 4696-2952 PT Time Calculation (min) (ACUTE ONLY): 24 min   Charges:   PT Evaluation $PT Eval Moderate Complexity: Robie [place], PT, DPT Acute Rehabilitation Services  Personal: [place] [facility] Office: [place] 11/27/2021, 10:21 AM

## 2021-11-28 ENCOUNTER — Telehealth: Payer: Self-pay | Admitting: Vascular Surgery

## 2021-11-28 DIAGNOSIS — L089 Local infection of the skin and subcutaneous tissue, unspecified: Secondary | ICD-10-CM | POA: Diagnosis not present

## 2021-11-28 DIAGNOSIS — E11628 Type 2 diabetes mellitus with other skin complications: Secondary | ICD-10-CM | POA: Diagnosis not present

## 2021-11-28 LAB — CBC WITH DIFFERENTIAL/PLATELET
Abs Immature Granulocytes: 0.13 10*3/uL — ABNORMAL HIGH (ref 0.00–0.07)
Basophils Absolute: 0.2 10*3/uL — ABNORMAL HIGH (ref 0.0–0.1)
Basophils Relative: 1 %
Eosinophils Absolute: 0.6 10*3/uL — ABNORMAL HIGH (ref 0.0–0.5)
Eosinophils Relative: 5 %
HCT: 28.5 % — ABNORMAL LOW (ref 39.0–52.0)
Hemoglobin: 9.6 g/dL — ABNORMAL LOW (ref 13.0–17.0)
Immature Granulocytes: 1 %
Lymphocytes Relative: 20 %
Lymphs Abs: 2.7 10*3/uL (ref 0.7–4.0)
MCH: 29.5 pg (ref 26.0–34.0)
MCHC: 33.7 g/dL (ref 30.0–36.0)
MCV: 87.7 fL (ref 80.0–100.0)
Monocytes Absolute: 1 10*3/uL (ref 0.1–1.0)
Monocytes Relative: 7 %
Neutro Abs: 9 10*3/uL — ABNORMAL HIGH (ref 1.7–7.7)
Neutrophils Relative %: 66 %
Platelets: 337 10*3/uL (ref 150–400)
RBC: 3.25 MIL/uL — ABNORMAL LOW (ref 4.22–5.81)
RDW: 14 % (ref 11.5–15.5)
WBC: 13.7 10*3/uL — ABNORMAL HIGH (ref 4.0–10.5)
nRBC: 0 % (ref 0.0–0.2)

## 2021-11-28 LAB — COMPREHENSIVE METABOLIC PANEL
ALT: 20 U/L (ref 0–44)
AST: 27 U/L (ref 15–41)
Albumin: 2.1 g/dL — ABNORMAL LOW (ref 3.5–5.0)
Alkaline Phosphatase: 71 U/L (ref 38–126)
Anion gap: 8 (ref 5–15)
BUN: 11 mg/dL (ref 8–23)
CO2: 22 mmol/L (ref 22–32)
Calcium: 8.4 mg/dL — ABNORMAL LOW (ref 8.9–10.3)
Chloride: 106 mmol/L (ref 98–111)
Creatinine, Ser: 1.68 mg/dL — ABNORMAL HIGH (ref 0.61–1.24)
GFR, Estimated: 40 mL/min — ABNORMAL LOW (ref >60)
Glucose, Bld: 139 mg/dL — ABNORMAL HIGH (ref 70–99)
Potassium: 3.6 mmol/L (ref 3.5–5.1)
Sodium: 136 mmol/L (ref 135–145)
Total Bilirubin: 0.6 mg/dL (ref 0.3–1.2)
Total Protein: 7.8 g/dL (ref 6.5–8.1)

## 2021-11-28 LAB — GLUCOSE, CAPILLARY
Glucose-Capillary: 128 mg/dL — ABNORMAL HIGH (ref 70–99)
Glucose-Capillary: 185 mg/dL — ABNORMAL HIGH (ref 70–99)

## 2021-11-28 LAB — MAGNESIUM: Magnesium: 1.6 mg/dL — ABNORMAL LOW (ref 1.7–2.4)

## 2021-11-28 MED ORDER — OXYCODONE-ACETAMINOPHEN 5-325 MG PO TABS: 1.0000 | ORAL_TABLET | Freq: Four times a day (QID) | ORAL | 0 refills | Status: DC | PRN

## 2021-11-28 MED ORDER — CLOPIDOGREL BISULFATE 75 MG PO TABS: 75.0000 mg | ORAL_TABLET | Freq: Every day | ORAL | Status: DC

## 2021-11-28 MED ORDER — INSULIN GLARGINE-YFGN 100 UNIT/ML ~~LOC~~ SOLN: 10.0000 [IU] | Freq: Every day | SUBCUTANEOUS | 11 refills | Status: DC

## 2021-11-28 MED ORDER — MAGNESIUM OXIDE -MG SUPPLEMENT 400 (240 MG) MG PO TABS: 400.0000 mg | ORAL_TABLET | Freq: Two times a day (BID) | ORAL | Status: DC

## 2021-11-28 NOTE — Discharge Summary (Signed)
Physician Discharge Summary  Justin [name] JME:[id number] DOB: [birth year] DOA: 11/15/2021  PCP: [facility]  Admit date: 11/15/2021 Discharge date: 11/28/2021  Time spent: 30 minutes  Recommendations for Outpatient Follow-up:  Requires CBC, Chem-12 in about 1 week at facility CC Dr. Amalia Hailey of podiatry for outpatient reassessment of the wound and keep dressing on as this-does need appointment with happens in about 5 to 10 days Would minimize meds such as Neurontin and other meds that have risk for encephalopathy sparingly use oxycodone for severe pain can use Tylenol first choice Note dosage changes of Lantus to 10 units as well as sliding scale as per prior No-check hemoglobin as well aste addition of Plavix because of angiogram and stenting and use this in addition to aspirin this hospitalization other labs in the next week If failing to thrive or worsening leg wounds-daughter is aware that may need a different type of discussion in terms of palliative care input We tested him on a regular diet and he was able to tolerate it so we discontinued his dysphagia diet  Discharge Diagnoses:  MAIN problem for hospitalization   Diabetic foot infection with amputation and clean margins   Please see below for itemized issues addressed in HOpsital- refer to other progress notes for clarity if needed  Discharge Condition: Improved diabetic heart healthy  Diet recommendation: 87 year old white male known skilled facility resident Known CAD reflux DM TY 2 Prior stroke HLD HTN Chronic pancreatitis Subsequent ischemic stroke 2/29/7989 with metabolic encephalopathy at that admission and agitation--- he also had COVID-19 at that time   Brought to [facility] 11/15/2021 Tmax 104 as well as right foot swelling Found to have leukocytosis 16.8 normal lactic acid CRP 10 Right foot x-ray showed erosion of distal phalanx with exposed bone MRI was ordered Was also found to have a  pneumonia   8/10-vascular as well as podiatry consulted-ABIs 8/10 showed moderate right lower extremity disease on right and mild disease on left 8/13  confused 8/14 EEG suggestive of cefepime induced neurotoxicity 8/15 mental status improved slightly 8/17 Diet graduated upwards--Mental status significantly improving 8/18 Angio resulting in lesions treated with Right popliteal artery drug-coated angioplasty (4x60 Inpact) Right tibioperoneal trunk angioplasty (3 x 220 mm Sterling) Right peroneal artery angioplasty (3x274mm Sterling)  8/20 right second toe amputation with metatarsal head being viable and clean margins obtained  Filed Weights   11/22/21 0818 11/25/21 0629  Weight: 72.3 kg 68.3 kg    History of present illness:  Aspiration pneumonia in the setting of prior CVA 05/2021 Initially was n.p.o. then on dysphagia 2 and he is tolerating this fair--watch for concerns for aspiration--completed azithromycin 8/15 Patient was tolerating regular diet on discharge and he should be watched for mental status changes but is able to masticate and tolerate the diet fairly well Diabetic foot infection with possible osteomyelitis Continue Zosyn -- vancomycin -->linezolid 2/2 ^ cre, Angiogram 8/18 as above-optimized with stenting--right toe amputated 8/20 antibiotics were discontinued 8/21 had no fevers no chills- wound needs to be watched carefully as there is risk for further breakdown as per Dr. Amalia Hailey and Dr. Amalia Hailey should see the patient in about a week Oxycodone preferentially to IV pain meds and Tylenol--careful with any sedating meds Toxic metabolic encephalopathy?  Secondary to cefepime EEG showed triphasic waves?  Secondary to cefepime induced neurotoxicity--encephalopathic pattern was felt to be more predominant per neurology  Mental status --CT scan stable Neurontin was completely discontinued this hospital stay because of risk for encephalopathy  Mild hypokalemia Potassium is replaced  however magnesium is quite low so start repletion with Mag-Ox 400 twice daily and will need repeat labs Intertrigo of groin Started on nystatin --discontinued on discharge as intertrigo improved as well as home dose of sliding scale Diabetes mellitus type 2 with neuropathy CBG improved to 150--160 range De-escalated-only use Neurontin at night Home dose of 24 units Lantus--continue Lantus 10 lower dose HLD CAD prior CVA 05/2021 Did resume aspirin and statin on discharge CKD 3B Creatinine slightly improved today after giving fluids      Procedures: As per above discussion    Discharge Exam: Vitals:   11/28/21 0325 11/28/21 0738  BP: 139/81 (!) 144/65  Pulse: 78 85  Resp: 19 15  Temp: 98.8 F (37.1 C) 99.2 F (37.3 C)  SpO2: 100% 98%    Subj on day of d/c   Doing fair coherent x1-does not know place or time but knows that he is hospitalized-seems to be about his baseline  General Exam on discharge  No icterus no pallor no rales no rhonchi Chest is clear no other added sound S1-S2 no murmur Abdomen is soft no rebound no guarding Neurologically is intact no focal deficit I did not examine his wound it is well packed and should remain this way until seen by podiatrist  Discharge Instructions   Discharge Instructions     Diet - low sodium heart healthy   Complete by: As directed    Discharge wound care:   Complete by: As directed    As above   Increase activity slowly   Complete by: As directed       Allergies as of 11/28/2021   No Known Allergies      Medication List     STOP taking these medications    azelastine 0.1 % nasal spray Commonly known as: ASTELIN   fluticasone 50 MCG/ACT nasal spray Commonly known as: FLONASE   gabapentin 300 MG capsule Commonly known as: NEURONTIN   gabapentin 400 MG capsule Commonly known as: NEURONTIN   insulin glargine 100 UNIT/ML injection Commonly known as: LANTUS   isosorbide mononitrate 10 MG  tablet Commonly known as: ISMO   Multi-Vitamin Daily Tabs       TAKE these medications    Accu-Chek Aviva Plus w/Device Kit Use to check blood sugar 3 times daily. Dx: E10.9   Accu-Chek Aviva Soln Use to check blood sugar 3 times daily. Dx: E10.9   accu-chek soft touch lancets Use to check blood sugar 3 times daily. Dx: E10.9   acetaminophen 325 MG tablet Commonly known as: TYLENOL Take 650 mg by mouth daily.   Alcohol Swabs Pads Use to check blood sugar 3 times daily. Dx: E10.9   aspirin 81 MG chewable tablet Chew 81 mg by mouth daily.   atorvastatin 40 MG tablet Commonly known as: LIPITOR Take 40 mg by mouth at bedtime. What changed: Another medication with the same name was removed. Continue taking this medication, and follow the directions you see here.   clopidogrel 75 MG tablet Commonly known as: PLAVIX Take 1 tablet (75 mg total) by mouth daily with breakfast.   feeding supplement (PRO-STAT SUGAR FREE 64) Liqd Take 30 mLs by mouth 2 (two) times daily.   fluticasone furoate-vilanterol 100-25 MCG/ACT Aepb Commonly known as: BREO ELLIPTA Inhale 1 puff into the lungs daily.   glucagon 1 MG injection Inject 1 mg into the muscle once as needed. What changed: reasons to take this   Glucerna  Liqd Take 237 mLs by mouth 2 (two) times daily.   glucose blood test strip Commonly known as: Accu-Chek Aviva Plus Use to check blood sugar 3 times daily. Dx: E10.9   insulin glargine-yfgn 100 UNIT/ML injection Commonly known as: SEMGLEE Inject 0.1 mLs (10 Units total) into the skin daily.   Insulin Pen Needle 32G X 4 MM Misc Use as directed to inject insulin tidwc   Ipratropium-Albuterol 20-100 MCG/ACT Aers respimat Commonly known as: COMBIVENT Inhale 1 puff into the lungs every 6 (six) hours as needed for wheezing.   lidocaine 4 % cream Commonly known as: LMX Apply 1 application  topically daily. Apply generously to right lower leg and heels daily-pain.    lipase/protease/amylase 12000-38000 units Cpep capsule Commonly known as: CREON Take 24,000 Units by mouth 3 (three) times daily before meals.   loratadine 10 MG tablet Commonly known as: CLARITIN Take 10 mg by mouth daily.   magnesium oxide 400 (240 Mg) MG tablet Commonly known as: MAG-OX Take 1 tablet (400 mg total) by mouth 2 (two) times daily.   mineral oil-hydrophilic petrolatum ointment Apply 1 Application topically See admin instructions. 1 application topical once a day. Mix with lidocaine cream-to feet/toes daily-dry skin   MINERIN CREME EX Apply 1 application  topically daily. Apply minerin cream to bilateral feet daily. R/t dry/cracked skin   NovoLOG FlexPen 100 UNIT/ML FlexPen Generic drug: insulin aspart Inject 3 mLs into the skin See admin instructions. Per sliding scale: If blood sugar is 70 to 100, give 0 units. If blood sugar is 201 to 250, give 2 units. If blood sugar is 251 to 300, give 4 units. If blood sugar is 301 to 350, give 6 units. If blood sugar is 351 to 400, give 8 units. If blood sugar is greater than 400, cal MD.   omeprazole 20 MG capsule Commonly known as: PRILOSEC Take 20 mg by mouth every other day.   oxyCODONE-acetaminophen 5-325 MG tablet Commonly known as: PERCOCET/ROXICET Take 1 tablet by mouth every 6 (six) hours as needed for severe pain.   Spacer/Aero-Holding [employer] Use as directed with combivent. Ok to dispense any spacer that works with the inhaler being rxed to pt.   Trulicity 1.5 KF/8.4CR Sopn Generic drug: Dulaglutide Inject 1.5 mg into the skin once a week. Fridays               Discharge Care Instructions  (From admission, onward)           Start     Ordered   11/28/21 0000  Discharge wound care:       Comments: As above   11/28/21 0800           No Known Allergies  Contact information for after-discharge care     Destination     [facility] .   Service: Skilled  Nursing Contact information: [place] [phone]                      The results of significant diagnostics from this hospitalization (including imaging, microbiology, ancillary and laboratory) are listed below for reference.    Significant Diagnostic Studies: DG Foot 2 Views Right  Result Date: 11/26/2021 CLINICAL DATA:  Status post amputation EXAM: RIGHT FOOT - 2 VIEW COMPARISON:  11/15/2021 FINDINGS: Status post right second digital amputation with expected overlying postoperative change. Redemonstrated sclerotic, chronically eroded appearance of the tip of the right great toe.  No fracture or dislocation. Extensive vascular calcinosis. IMPRESSION: Status post right second digital amputation with expected overlying postoperative change. Electronically Signed   By: Delanna Ahmadi M.D.   On: 11/26/2021 12:31   PERIPHERAL VASCULAR CATHETERIZATION  Result Date: 11/24/2021 DATE OF SERVICE: 11/24/2021  PATIENT:  [name]  87 y.o. male  PRE-OPERATIVE DIAGNOSIS:  Atherosclerosis of native arteries of right lower extremity causing gangrene  POST-OPERATIVE DIAGNOSIS:  Same  PROCEDURE:  1) Ultrasound guided left common femoral artery access 2) Aortogram 3) Right lower extremity angiogram with third order cannulation 4) Right popliteal artery drug-coated angioplasty (4x60 Inpact) 5) Right tibioperoneal trunk angioplasty (3 x 220 mm Sterling) 6) Right peroneal artery angioplasty (3x293m Sterling)  SURGEON:  TYevonne Aline HStanford Breed MD  ASSISTANT: none  ANESTHESIA:   local  ESTIMATED BLOOD LOSS: minimal  LOCAL MEDICATIONS USED:  LIDOCAINE  COUNTS: confirmed correct.  PATIENT DISPOSITION:  PACU - hemodynamically stable.  Delay start of Pharmacological VTE agent (>24hrs) due to surgical blood loss or risk of bleeding: no  INDICATION FOR PROCEDURE: HTARYLL REICHENBERGERis a 87y.o. male with right foot gangrene and noninvasive evidence of peripheral arterial  disease.. After careful discussion of risks, benefits, and alternatives the patient was offered angiography with possible intervention. The patient understood and wished to proceed.  OPERATIVE FINDINGS: Terminal aorta and iliac arteries: Widely patent without flow-limiting stenosis  Right lower extremity: Common femoral artery: Patent without stenosis Profunda femoris artery: Patent without stenosis Superficial femoral artery: Patent without stenosis Popliteal artery: Critical behind and below-knee stenosis (greater than 90%) Anterior tibial artery: Occluded Tibioperoneal trunk: Severe stenosis (70%) Peroneal artery: Severe, multifocal stenosis (greatest 70%) Posterior tibial artery: Occluded Pedal circulation: Fills via collateralization from the peroneal artery.  Severely disadvantaged in the mid and distal foot.  GLASS score. FP 4. IP 4. Stage III. High complexity disease.  WIfI score. 2 / 1 / 0. Clinical stage III. Moderate amputation risk.  DESCRIPTION OF PROCEDURE: After identification of the patient in the pre-operative holding area, the patient was transferred to the operating room. The patient was positioned supine on the operating room table. Anesthesia was induced. The groins was prepped and draped in standard fashion. A surgical pause was performed confirming correct patient, procedure, and operative location.  The left groin was anesthetized with subcutaneous injection of 1% lidocaine. Using ultrasound guidance, the left common femoral artery was accessed with micropuncture technique. Fluoroscopy was used to confirm cannulation over the femoral head. The 32F sheath was upsized to 53F.  A Benson wire was advanced into the distal aorta. Over the wire an omni flush catheter was advanced to the level of L2. Aortogram was performed - see above for details.  The right common iliac artery was selected with an omniflush catheter and guidewire. The wire was advanced into the common femoral artery. Over the wire  the omni flush catheter was advanced into the external iliac artery. Selective angiography was performed - see above for details.  The decision was made to intervene. The patient was heparinized with 7,000 units of heparin. The 53F sheath was exchanged for a 16F x 45cm sheath. Selective angiography of the left lower extremity was performed prior to intervention.  The lesions were treated with: Right popliteal artery drug-coated angioplasty (4x60 Inpact) Right tibioperoneal trunk angioplasty (3 x 220 mm Sterling) Right peroneal artery angioplasty (3x2259mSterling)  Completion angiography revealed: Resolution of stenoses and restoration of inline flow to the ankle. Disadvantaged pedal circulation  A perclose device  was used to close the arteriotomy. Hemostasis was excellent upon completion.  Upon completion of the case instrument and sharps counts were confirmed correct. The patient was transferred to the  PACU in good condition. I was present for all portions of the procedure.  PLAN: ASA $RemoveB'81mg'EAcJWUZY$  PO QD. Plavix $RemoveBe'75mg'CPcUqWjvk$  PO QD. High intensity statin therapy. Optimized from a vascular standpoint.  [name], MD [facility] Phone Number: [phone] 11/24/2021 5:40 PM   CT HEAD WO CONTRAST (5MM)  Result Date: 11/19/2021 CLINICAL DATA:  Initial evaluation for altered mental status. EXAM: CT HEAD WITHOUT CONTRAST TECHNIQUE: Contiguous axial images were obtained from the base of the skull through the vertex without intravenous contrast. RADIATION DOSE REDUCTION: This exam was performed according to the departmental dose-optimization program which includes automated exposure control, adjustment of the mA and/or kV according to patient size and/or use of iterative reconstruction technique. COMPARISON:  Prior MRI from 05/22/2021. FINDINGS: Brain: Generalized age-related cerebral atrophy. Patchy and confluent hypodensity involving the supratentorial cerebral white matter, most  consistent with chronic small vessel ischemic disease, fairly advanced in nature. No acute intracranial hemorrhage. No acute large vessel territory infarct. No mass lesion, mass effect or midline shift. Mild ventricular prominence related global parenchymal volume loss of hydrocephalus. No extra-axial fluid collection. Vascular: No abnormal hyperdense vessel. Calcified atherosclerosis present at the skull base. Skull: Scalp soft tissues demonstrate no acute finding. Calvarium intact. Sinuses/Orbits: Globes orbital soft tissues demonstrate no acute finding. Prior ocular lens replacement on the right. Bilateral senescent calcifications noted. Moderate mucosal thickening throughout the paranasal sinuses with air-fluid level within the left maxillary sinus. Mastoid air cells are clear. Other: None. IMPRESSION: 1. No acute intracranial abnormality. 2. Generalized age-related cerebral atrophy with advanced chronic small vessel ischemic disease. 3. Moderate paranasal sinus disease with air-fluid level within the left maxillary sinus, suggesting acute sinusitis. Electronically Signed   By: Jeannine Boga M.D.   On: 11/19/2021 20:48   EEG adult  Result Date: 11/19/2021 Greta Doom, MD     11/19/2021  7:49 PM History: 87 year old male being evaluated for altered mental status Sedation: None Technique: This EEG was acquired with electrodes placed according to the International 10-20 electrode system (including Fp1, Fp2, F3, F4, C3, C4, P3, P4, O1, O2, T3, T4, T5, T6, A1, A2, Fz, Cz, Pz). The following electrodes were missing or displaced: none. Background: There are generalized periodic discharges with a frequency of 1 to 2 Hz with triphasic morphology.  In addition there is generalized irregular delta and theta activities throughout the recording.  There is no definite posterior dominant rhythm seen. Photic stimulation: Physiologic driving is now performed EEG Abnormalities: 1) generalized periodic  discharges with triphasic morphology 2) generalized irregular slow activity 3) absent posterior dominant rhythm Clinical Interpretation: This EEG is most consistent with a generalized nonspecific cerebral dysfunction (encephalopathy).  Though triphasic waves can be associated with cortical irritability at times, would favor an encephalopathic origin at this time.  This pattern can be seen with cefepime induced neurotoxicity as well as other metabolic encephalopathies. If there is a high concern for seizures, could consider long-term EEG monitoring, but encephalopathic pattern is favored at this time. Roland Rack, MD [facility] [phone] If 7pm- 7am, please page neurology on call as listed in [place].   VAS [place] ABI WITH/WO TBI  Result Date: 11/17/2021  LOWER EXTREMITY DOPPLER STUDY Patient Name:  ARON INGE  Date of Exam:   11/16/2021 Medical Rec #:  [id number]         Accession #:    [id number] Date of Birth: [birth year]         Patient Gender: M Patient Age:   81 years Exam Location:  [facility] Procedure:      VAS [place] ABI WITH/WO TBI Referring Phys: ADAM MCDONALD --------------------------------------------------------------------------------  Indications: Ulceration. High Risk Factors: Hypertension, hyperlipidemia, Diabetes, past history of                    smoking, prior CVA.  Limitations: Today's exam was limited due to Movement. Comparison Study: No prior study on file Performing Technologist: Sharion Dove RVS  Examination Guidelines: A complete evaluation includes at minimum, Doppler waveform signals and systolic blood pressure reading at the level of bilateral brachial, anterior tibial, and posterior tibial arteries, when vessel segments are accessible. Bilateral testing is considered an integral part of a complete examination. Photoelectric Plethysmograph (PPG) waveforms and toe systolic pressure readings are included as required and additional duplex testing as  needed. Limited examinations for reoccurring indications may be performed as noted.  ABI Findings: +---------+------------------+-----+-----------+--------+ Right    Rt Pressure (mmHg)IndexWaveform   Comment  +---------+------------------+-----+-----------+--------+ Brachial 159                    triphasic           +---------+------------------+-----+-----------+--------+ PTA      102               0.64 multiphasic         +---------+------------------+-----+-----------+--------+ DP       120               0.75 multiphasic         +---------+------------------+-----+-----------+--------+ Great Toe54                0.34                     +---------+------------------+-----+-----------+--------+ +---------+------------------+-----+-----------+------------------+ Left     Lt Pressure (mmHg)IndexWaveform   Comment            +---------+------------------+-----+-----------+------------------+ Brachial 148                    triphasic                     +---------+------------------+-----+-----------+------------------+ PTA      136               0.86            Sounds multiphasic +---------+------------------+-----+-----------+------------------+ DP       130               0.82 multiphasic                   +---------+------------------+-----+-----------+------------------+ Great Toe162               1.02                               +---------+------------------+-----+-----------+------------------+ +-------+-----------+-----------+------------+------------+ ABI/TBIToday's ABIToday's TBIPrevious ABIPrevious TBI +-------+-----------+-----------+------------+------------+ Right  0.75       0.34                                +-------+-----------+-----------+------------+------------+ Left   0.86       1.02                                +-------+-----------+-----------+------------+------------+  Summary: Right: Resting right  ankle-brachial index indicates moderate right lower extremity arterial disease. The right toe-brachial index is abnormal. Left: Resting left ankle-brachial index indicates mild left lower extremity arterial disease. The left toe-brachial index is normal. *See table(s) above for measurements and observations.  Electronically signed by Orlie Pollen on 11/17/2021 at 5:40:39 PM.    Final    DG Swallowing Func-Speech Pathology  Result Date: 11/17/2021 Table formatting from the original result was not included. Objective Swallowing Evaluation: Type of Study: MBS-Modified Barium Swallow Study  Patient Details Name: JODI KAPPES MRN: [id number] Date of Birth: Feb 06, 1935 Today's Date: 11/17/2021 Time: SLP Start Time (ACUTE ONLY): 3734 -SLP Stop Time (ACUTE ONLY): 2876 SLP Time Calculation (min) (ACUTE ONLY): 13 min Past Medical History: Past Medical History: Diagnosis Date  Acute metabolic encephalopathy 11/17/5724  Arthritis   Diabetes mellitus   HTN (hypertension)   Hypercholesteremia   Iron deficiency   Pancreatitis, acute 06/02/2012  Diagnosed 05/2012   Personal history of colonic adenomas 04/15/2013  04/15/2013 2 diminutive polyps    Stroke [facility])   TIA (transient ischemic attack)  Past Surgical History: Past Surgical History: Procedure Laterality Date  ARTERY BIOPSY  02/26/2011  Procedure: BIOPSY TEMPORAL ARTERY;  Surgeon: Willey Blade, MD;  Location: [place];  Service: General;  Laterality: Left;  Left temporal artery biospy  [place] / LIGATION  02/26/2011  left side  UPPER GASTROINTESTINAL ENDOSCOPY   HPI: Pt is an 87 y.o. male who presented with worsening right leg and foot pain. CXR 8/11 completed due to fever: New focal consolidation of the right mid lung, concerning for infection. PMH: dementia, CVA, GERD, CKD 3B, hyperlipidemia, diabetes, neuropathy, history of CVA, CAD, hypertension, chronic  pancreatitis, anemia, diabetic foot wound.  No data recorded  Recommendations for follow up therapy are one component of a multi-disciplinary discharge planning process, led by the attending physician.  Recommendations may be updated based on patient status, additional functional criteria and insurance authorization. Assessment / Plan / Recommendation   11/17/2021   1:50 PM Clinical Impressions Clinical Impression Pt exhibited mild oral holding and lingual pumping with puree and liquids, but his oropharyngeal swallow mechainsm was otherwise [facility] with adequate movement and strength of oral and pharyngeal structures. No pharyngeal residue was noted throughout the study and pharyngeal stripping was WNL. Esophageal sweep revealed stasis of barium in the upper thoracic esophagus which was cleared with additional boluses of thin liquids. Considering esophageal stasis and pt's diagnosis of GERD, post-prandial aspiration is questioned. The etiology of esophageal stasis cannot be determined with this study; consider esophageal assessment (e.g., esophagram). Pt's current diet will be continued and SLP will follow briefly for education, but further SLP services will likely not be clinically indicated beyond that point. SLP Visit Diagnosis Dysphagia, oral phase (R13.11) Impact on safety and function Mild aspiration risk     11/17/2021   1:50 PM Treatment Recommendations Treatment Recommendations Therapy as outlined in treatment plan below     11/17/2021   1:50 PM Prognosis Prognosis for Safe Diet Advancement Good Barriers to Reach Goals Cognitive deficits   11/17/2021   1:50 PM Diet Recommendations SLP Diet Recommendations Regular solids;Thin liquid Liquid Administration via Cup;Straw Medication Administration Whole meds with liquid Compensations Slow rate;Follow solids with liquid;Minimize environmental distractions Postural Changes Seated upright at 90 degrees     11/17/2021  1:50 PM Other Recommendations Recommended Consults  Consider esophageal assessment Oral Care Recommendations Oral care BID Follow Up Recommendations No SLP follow up Assistance recommended at discharge Frequent or constant Supervision/Assistance Functional Status Assessment Patient has not had a recent decline in their functional status   11/17/2021   1:50 PM Frequency and Duration  Speech Therapy Frequency (ACUTE ONLY) min 1 x/week Treatment Duration 1 week     11/17/2021   1:50 PM Oral Phase Oral Phase Impaired Oral - Thin Cup Holding of bolus;Lingual pumping;Reduced posterior propulsion Oral - Thin Straw Holding of bolus;Lingual pumping;Reduced posterior propulsion Oral - Puree Holding of bolus;Lingual pumping Oral - Regular WFL Oral - Pill Lingual pumping    11/17/2021   1:50 PM Pharyngeal Phase Pharyngeal Phase [facility]    11/17/2021   1:50 PM Cervical Esophageal Phase  Cervical Esophageal Phase [facility] Shanika I. Hardin Negus, [place], [place] Office number [phone] [name] 11/17/2021, 3:12 PM                     DG CHEST PORT 1 VIEW  Result Date: 11/17/2021 CLINICAL DATA:  Fever EXAM: PORTABLE CHEST 1 VIEW COMPARISON:  Chest x-ray dated May 21, 2021 FINDINGS: Cardiac and mediastinal contours within normal limits. New focal consolidation of the right mid lung. Bibasilar atelectasis. Linear opacity of the left mid lung, unchanged when compared prior and likely due to scarring. Large pleural effusion or pneumothorax. IMPRESSION: New focal consolidation of the right mid lung, concerning for infection. Recommend follow-up PA and lateral chest x-ray in 6-8 weeks to ensure resolution. Electronically Signed   By: Yetta Glassman M.D.   On: 11/17/2021 08:06   MR FOOT RIGHT WO CONTRAST  Result Date: 11/16/2021 CLINICAL DATA:  Foot swelling, diabetic, osteomyelitis suspected, xray done diabetic foot infection EXAM: MRI OF THE RIGHT FOREFOOT WITHOUT CONTRAST TECHNIQUE: Multiplanar, multisequence MR imaging of the right forefoot was  performed. No intravenous contrast was administered. COMPARISON:  Right foot radiograph 11/15/2021 FINDINGS: Motion degraded exam. Bones/Joint/Cartilage There is erosion of the great toe distal phalangeal tuft as seen radiographically. There is questionable minimal marrow edema at the residual tip of the distal phalanx but largely preserved marrow signal. No other significant marrow signal alteration in the forefoot. Ligaments Poorly evaluate due to motion artifact. Muscles and Tendons There is diffuse muscle edema muscle atrophy in the foot as is commonly seen in diabetics. No acute tendon tear in the forefoot. Soft tissues Diffuse soft tissue swelling of the foot. No defined fluid collection. IMPRESSION: Distal great toe ulceration with erosion of the distal phalangeal tuft and questionable minimal edema signal at the tip of the residual distal phalanx. There is largely preserved marrow signal. Findings could reflect very early osteomyelitis or reactive marrow change. No evidence of soft tissue abscess. Electronically Signed   By: Maurine Simmering M.D.   On: 11/16/2021 07:21   DG Foot Complete Right  Result Date: 11/15/2021 CLINICAL DATA:  Right foot pain EXAM: RIGHT FOOT COMPLETE - 3+ VIEW COMPARISON:  None Available. FINDINGS: Osseous structures are diffusely osteopenic. Ulcer involving the a distal aspect of the right great toe noted with erosion of the terminal distal phalanx which is likely exposed at the base of the ulcer. No acute fracture or dislocation. Advanced vascular calcifications are seen within the right foot. IMPRESSION: Ulcer involving the distal aspect of the right great toe with erosion of the terminal distal phalanx, likely exposed at the base of the ulcer. Electronically Signed  By: Fidela Salisbury M.D.   On: 11/15/2021 19:46    Microbiology: Recent Results (from the past 240 hour(s))  Aerobic/Anaerobic Culture w Gram Stain (surgical/deep wound)     Status: None (Preliminary result)    Collection Time: 11/26/21 11:01 AM   Specimen: Abscess  Result Value Ref Range Status   Specimen Description ABSCESS  Final   Special Requests NONE  Final   Gram Stain   Final    NO WBC SEEN WBC PRESENT, PREDOMINANTLY MONONUCLEAR RARE GRAM POSITIVE COCCI IN PAIRS    Culture   Final    FEW STAPHYLOCOCCUS AUREUS SUSCEPTIBILITIES TO FOLLOW Performed at [facility], [place] [address] [place], [place] [address]    Report Status PENDING  Incomplete     Labs: Basic Metabolic Panel: Recent Labs  Lab 11/23/21 0116 11/24/21 1754 11/25/21 0204 11/26/21 0319 11/27/21 0820 11/28/21 0312  NA 143 139 134* 132* 134* 136  K 3.8 3.9 3.5 3.1* 4.0 3.6  CL 114* 111 107 105 104 106  CO2 18* 19* 20* 19* 22 22  GLUCOSE 168* 220* 313* 158* 161* 139*  BUN 25* 23 24* _0 CREATININE 2.05* 1.91* 1.94* 1.74* 1.74* 1.68*  CALCIUM 9.2 8.9 8.4* 8.1* 8.2* 8.4*  MG  --   --   --   --  1.6* 1.6*  PHOS 3.0  --   --   --   --   --    Liver Function Tests: Recent Labs  Lab 11/23/21 0116 11/25/21 0204 11/28/21 0312  AST  --  25 27  ALT  --  22 20  ALKPHOS  --  76 71  BILITOT  --  0.4 0.6  PROT  --  8.0 7.8  ALBUMIN 2.4* 2.1* 2.1*   No results for input(s): "LIPASE", "AMYLASE" in the last 168 hours. No results for input(s): "AMMONIA" in the last 168 hours. CBC: Recent Labs  Lab 11/22/21 0159 11/23/21 0116 11/24/21 1754 11/25/21 0204 11/28/21 0312  WBC 16.6* 16.9* 15.2* 15.7* 13.7*  NEUTROABS  --  11.7*  --  11.1* 9.0*  HGB 10.3* 10.5* 11.2* 9.4* 9.6*  HCT 31.3* 31.7* 34.5* 28.0* 28.5*  MCV 90.7 90.3 90.1 89.7 87.7  PLT 329 387 405* 339 337   Cardiac Enzymes: No results for input(s): "CKTOTAL", "CKMB", "CKMBINDEX", "TROPONINI" in the last 168 hours. BNP: BNP (last 3 results) No results for input(s): "BNP" in the last 8760 hours.  ProBNP (last 3 results) No results for input(s): "PROBNP" in the last 8760 hours.  CBG: Recent Labs  Lab 11/27/21 0627 11/27/21 1150  11/27/21 1700 11/27/21 2108 11/28/21 0633  GLUCAP 164* 233* 149* 142* 128*       Signed:  Nita Sells MD   [facility] 11/28/2021, 8:00 AM

## 2021-11-28 NOTE — Telephone Encounter (Signed)
-----   Message from Cherre Robins, MD sent at 11/25/2021  4:26 PM EDT ----- Follow up with [facility] PA in 1 month with [facility]. Thank you. Gershon Mussel

## 2021-11-28 NOTE — TOC Transition Note (Signed)
Transition of Care [facility]) - CM/SW Discharge Note   Patient Details  Name: Justin [name] MRN: [id number] Date of Birth: 01/05/1935  Transition of Care [facility]) CM/SW Contact:  Vinie Sill, LCSW Phone Number: 11/28/2021, 11:11 AM   Clinical Narrative:     Patient will Discharge to: [place]  Discharge Date: 11/28/2021 Family Notified: daughter Transport By: Corey Harold  Per MD patient is ready for discharge. RN, patient, and facility notified of discharge. Discharge Summary sent to facility. RN given number for [phone], Rm [phone]. Ambulance transport requested for patient.   Clinical Social Worker signing off.  Thurmond Butts, MSW, LCSW Clinical Social Worker     Final next level of care: Skilled Nursing Facility Barriers to Discharge: Barriers Resolved   Patient Goals and CMS Choice        Discharge Placement              Patient chooses bed at: [facility] Patient to be transferred to facility by: [place] Name of family member notified: daughter Patient and family notified of of transfer: 11/28/21  Discharge Plan and Services                                     Social Determinants of Health (SDOH) Interventions     Readmission Risk Interventions     No data to display

## 2021-11-28 NOTE — Plan of Care (Signed)
DISCHARGE NOTE [facility] to be discharged [facility] per MD order. Patient verbalized understanding.  IV catheter discontinued intact. Site without signs and symptoms of complications. Dressing and pressure applied. Pt denies pain at the site currently. No complaints noted.  Discharge packet assembled. An After Visit Summary (AVS) was printed and given to the EMS personnel. Patient to be escorted via stretcher and discharged to [facility] via ambulance. Report called to accepting facility; all questions and concerns addressed.   Stephan Minister, RN

## 2021-11-28 NOTE — Progress Notes (Signed)
Speech Language Pathology Treatment: Dysphagia  Patient Details Name: Justin [name] MRN: [id number] DOB: 10/14/1934 Today's Date: 11/28/2021 Time: 8648-4720 SLP Time Calculation (min) (ACUTE ONLY): 13 min  Assessment / Plan / Recommendation Clinical Impression  Pt was seen for dysphagia treatment with his daughter present. Pt's mentation appears to have returned to baseline and pt's family stated that "he's back to himself now". Pt's diet was advanced to regular texture solids and thin liquids since he was last seen by SLP. Pt, nursing, and his daughter reported that the pt has been tolerating the current diet without overt s/s of aspiration. Pt's RN denied difficulty with multiple whole pills which were taken simultaneously with thin liquids. Pt tolerated regular texture solids, and thin liquids without symptoms of oropharyngeal dysphagia. It is recommended that the current diet of regular texture solids and thin liquids be continued. Further skilled SLP services are not clinically indicated at this time.    HPI HPI: Pt is an 87 y.o. male who presented with worsening right leg and foot pain. CXR 8/11 completed due to fever: New focal consolidation of the right mid lung, concerning for infection. PMH: dementia, CKD 3B, hyperlipidemia, diabetes, neuropathy, history of CVA, CAD, hypertension, chronic pancreatitis, anemia, diabetic foot wound.      SLP Plan  All goals met;Discharge SLP treatment due to (comment)      Recommendations for follow up therapy are one component of a multi-disciplinary discharge planning process, led by the attending physician.  Recommendations may be updated based on patient status, additional functional criteria and insurance authorization.    Recommendations  Diet recommendations: Regular;Thin liquid Liquids provided via: Cup;Straw Medication Administration: Whole meds with liquid Supervision: Staff to assist with self feeding Compensations: Minimize  environmental distractions Postural Changes and/or Swallow Maneuvers: Seated upright 90 degrees                Oral Care Recommendations: Oral care BID Follow Up Recommendations:  (TBD) Assistance recommended at discharge: Frequent or constant Supervision/Assistance SLP Visit Diagnosis: Dysphagia, unspecified (R13.10) Plan: All goals met;Discharge SLP treatment due to (comment)       Bemnet Trovato I. Hardin Negus, [place], [place] Office number [phone]    [name]  11/28/2021, 11:19 AM

## 2021-11-28 NOTE — Care Management Important Message (Signed)
Important Message  Patient Details  Name: Justin [name] MRN: [id number] Date of Birth: Aug 05, 1934   Medicare Important Message Given:  Yes     Shelda Altes 11/28/2021, 10:36 AM

## 2021-11-29 NOTE — Op Note (Signed)
PATIENT:  Justin [name]  87 y.o. male   PRE-OPERATIVE DIAGNOSIS:  gangrene, osteomyelitis   POST-OPERATIVE DIAGNOSIS:  gangrene, osteomyelitis   PROCEDURE:  Procedure(s): AMPUTATION TOE (Right)   SURGEON:  Surgeon(s) and Role:    * [name], DPM - Primary   PHYSICIAN ASSISTANT:    ASSISTANTS: none    ANESTHESIA:   MAC   EBL:  20 mL    BLOOD ADMINISTERED:none   DRAINS: none    LOCAL MEDICATIONS USED:  MARCAINE   , BUPIVICAINE , and Amount: 10 ml   SPECIMEN:  Source of Specimen:  right 2nd toe for pathology and wound culture   DISPOSITION OF SPECIMEN:  PATHOLOGY   COUNTS:  YES   TOURNIQUET:  * No tourniquets in log *   DICTATION: .Dragon Dictation   PLAN OF CARE: Admit to inpatient    PATIENT DISPOSITION:  PACU - hemodynamically stable.   Delay start of Pharmacological VTE agent (>24hrs) due to surgical blood loss or risk of bleeding: no   Intraoperative findings: S/p right 2nd toe amputation. Purulence noted and culture obtained. Metatarsal head appeared viable. No proximal tracking. Hemostasis acheived and wound closed.   Debrided callus/nail on the right hallux and there is no open wound, drainage. No swelling or redness/signs of infection so I did not proceed with amputation of the toe. There is macerated tissue along the interspaces and some mild hyperpigmented changes to the right 4th and 5th toe that needs to be monitored closely. He may eventually need a TMA depending on how he heals.    Indications for surgery: 87 year old male found to have gangrenous changes of his right second toe.  Previous MRI did show concern for osteomyelitis possibly the hallux.  He does have a history of a wound there previously however that has healed.  I discussed the family right second amputation and possible partial hallux amputation.  Discussed all alternatives risks and complications.  They understand and wish to proceed with surgery.  Procedure in  detail: Patient's with verbally and visually identified by myself and nursing staff and the anesthesia staff.  The patient was then transferred to the operating room via stretcher and placed on operating table in supine position.  After adequate plane of anesthesia obtained a timeout was performed and a mixture of lidocaine, Marcaine plain was infiltrated in a regional block fashion on the right foot.  Right lower extremities and scrubbed, prepped, draped in normal sterile fashion.  Attention was first directed on the hallux and I sharply debrided the calluses present the distal aspect the hallux as well as the nail and I was not able to identify any ulceration or exposed bone.  There is no drainage or pus.  No significant edema there is no erythema or warmth and there is no clinical signs of infection.  Due to this I did not proceed with any type of surgery in the hallux.  Attention was directed to the second toe in which a modified fishmouth incision was made just proximal to the area of gangrenous changes.  Incision was made with 15 scalpel down skin to bone.  I was able to identify the MPJ and the toe was disarticulated at this level.  There was found to be purulence coming from the toe and a wound culture was obtained.  The metatarsal head appeared to be viable.  No proximal tracking or purulence noted.  I copiously irrigated the incision with saline and hemostasis was achieved.  Incision was then  closed with 3-0 Prolene.  Patient has pain over the incision for by Xeroform and dry sterile dressing.  He was woken from anesthesia and found her father the procedure well and complications.  Transferred to PACU vital signs stable vascular status intact.

## 2021-12-01 ENCOUNTER — Ambulatory Visit (INDEPENDENT_AMBULATORY_CARE_PROVIDER_SITE_OTHER): Payer: Medicare Other

## 2021-12-01 ENCOUNTER — Ambulatory Visit (INDEPENDENT_AMBULATORY_CARE_PROVIDER_SITE_OTHER): Payer: Medicare Other | Admitting: Podiatry

## 2021-12-01 DIAGNOSIS — L089 Local infection of the skin and subcutaneous tissue, unspecified: Secondary | ICD-10-CM | POA: Diagnosis not present

## 2021-12-01 DIAGNOSIS — L97511 Non-pressure chronic ulcer of other part of right foot limited to breakdown of skin: Secondary | ICD-10-CM

## 2021-12-01 DIAGNOSIS — Z9889 Other specified postprocedural states: Secondary | ICD-10-CM

## 2021-12-01 DIAGNOSIS — M79671 Pain in right foot: Secondary | ICD-10-CM

## 2021-12-01 DIAGNOSIS — I739 Peripheral vascular disease, unspecified: Secondary | ICD-10-CM | POA: Diagnosis not present

## 2021-12-01 DIAGNOSIS — I6381 Other cerebral infarction due to occlusion or stenosis of small artery: Secondary | ICD-10-CM

## 2021-12-01 DIAGNOSIS — E11628 Type 2 diabetes mellitus with other skin complications: Secondary | ICD-10-CM | POA: Diagnosis not present

## 2021-12-01 LAB — AEROBIC/ANAEROBIC CULTURE W GRAM STAIN (SURGICAL/DEEP WOUND)

## 2021-12-01 NOTE — Progress Notes (Unsigned)
Subjective: Chief Complaint  Patient presents with   Routine Post Op    R Amputation    87 year old male presents the office today status post right second toe amputation which was performed on 8/20/203.  Presents today for follow-up.  Denies any fevers or chills or any significant pain.  Objective: AAO x3, NAD DP/PT pulses decreased bilaterally Status post right second amputation.  Sutures are intact and there is macerated tissue.  There is no drainage or pus.  Is also superficial wounds present on the third, fourth, fifth toes.  There is no probing, amount of swelling.  No ascending cellulitis.  No fluctuation or crepitus.  No malodor. No pain with calf compression, swelling, warmth, erythema  Assessment: Status post right second toe potation with macerated skin; ulcerations on lesser digits  Plan: -All treatment options discussed with the patient including all alternatives, risks, complications.  -I applied Betadine to the incision site for the macerated tissue.  Xeroform was applied to the other incisions and a dry sterile dressing was applied.  Given the concern of the other wounds and also I will refer him to the wound care center. -Continue offloading -Monitor for any clinical signs or symptoms of infection and directed to call the office immediately should any occur or go to the ER. -Patient encouraged to call the office with any questions, concerns, change in symptoms.   [name] DPM

## 2021-12-07 ENCOUNTER — Other Ambulatory Visit: Payer: Self-pay | Admitting: Podiatry

## 2021-12-07 MED ORDER — DOXYCYCLINE HYCLATE 100 MG PO TABS: 100.0000 mg | ORAL_TABLET | Freq: Two times a day (BID) | ORAL | 0 refills | Status: DC

## 2021-12-09 ENCOUNTER — Emergency Department (HOSPITAL_COMMUNITY): Payer: Medicare Other

## 2021-12-09 ENCOUNTER — Inpatient Hospital Stay (HOSPITAL_COMMUNITY)
Admission: EM | Admit: 2021-12-09 | Discharge: 2021-12-13 | DRG: 071 | Disposition: A | Discharge status: Skilled Nursing Facility | Payer: Medicare Other | Source: Skilled Nursing Facility | Attending: Internal Medicine | Admitting: Internal Medicine

## 2021-12-09 DIAGNOSIS — Z8249 Family history of ischemic heart disease and other diseases of the circulatory system: Secondary | ICD-10-CM

## 2021-12-09 DIAGNOSIS — I129 Hypertensive chronic kidney disease with stage 1 through stage 4 chronic kidney disease, or unspecified chronic kidney disease: Secondary | ICD-10-CM | POA: Diagnosis present

## 2021-12-09 DIAGNOSIS — G9341 Metabolic encephalopathy: Principal | ICD-10-CM | POA: Diagnosis present

## 2021-12-09 DIAGNOSIS — L819 Disorder of pigmentation, unspecified: Secondary | ICD-10-CM | POA: Diagnosis present

## 2021-12-09 DIAGNOSIS — Z87891 Personal history of nicotine dependence: Secondary | ICD-10-CM

## 2021-12-09 DIAGNOSIS — Z86010 Personal history of colonic polyps: Secondary | ICD-10-CM

## 2021-12-09 DIAGNOSIS — E11628 Type 2 diabetes mellitus with other skin complications: Secondary | ICD-10-CM | POA: Diagnosis present

## 2021-12-09 DIAGNOSIS — I739 Peripheral vascular disease, unspecified: Secondary | ICD-10-CM | POA: Diagnosis present

## 2021-12-09 DIAGNOSIS — F05 Delirium due to known physiological condition: Secondary | ICD-10-CM | POA: Diagnosis present

## 2021-12-09 DIAGNOSIS — G9340 Encephalopathy, unspecified: Secondary | ICD-10-CM | POA: Diagnosis present

## 2021-12-09 DIAGNOSIS — Z9049 Acquired absence of other specified parts of digestive tract: Secondary | ICD-10-CM

## 2021-12-09 DIAGNOSIS — Z7985 Long-term (current) use of injectable non-insulin antidiabetic drugs: Secondary | ICD-10-CM

## 2021-12-09 DIAGNOSIS — E782 Mixed hyperlipidemia: Secondary | ICD-10-CM | POA: Diagnosis present

## 2021-12-09 DIAGNOSIS — N481 Balanitis: Secondary | ICD-10-CM | POA: Diagnosis not present

## 2021-12-09 DIAGNOSIS — Z794 Long term (current) use of insulin: Secondary | ICD-10-CM

## 2021-12-09 DIAGNOSIS — E1151 Type 2 diabetes mellitus with diabetic peripheral angiopathy without gangrene: Secondary | ICD-10-CM | POA: Diagnosis present

## 2021-12-09 DIAGNOSIS — E1142 Type 2 diabetes mellitus with diabetic polyneuropathy: Secondary | ICD-10-CM | POA: Diagnosis present

## 2021-12-09 DIAGNOSIS — R4182 Altered mental status, unspecified: Secondary | ICD-10-CM | POA: Diagnosis not present

## 2021-12-09 DIAGNOSIS — N1832 Chronic kidney disease, stage 3b: Secondary | ICD-10-CM | POA: Diagnosis present

## 2021-12-09 DIAGNOSIS — Z66 Do not resuscitate: Secondary | ICD-10-CM | POA: Diagnosis present

## 2021-12-09 DIAGNOSIS — Z7982 Long term (current) use of aspirin: Secondary | ICD-10-CM

## 2021-12-09 DIAGNOSIS — L97519 Non-pressure chronic ulcer of other part of right foot with unspecified severity: Secondary | ICD-10-CM | POA: Diagnosis present

## 2021-12-09 DIAGNOSIS — Z7951 Long term (current) use of inhaled steroids: Secondary | ICD-10-CM

## 2021-12-09 DIAGNOSIS — Z8673 Personal history of transient ischemic attack (TIA), and cerebral infarction without residual deficits: Secondary | ICD-10-CM

## 2021-12-09 DIAGNOSIS — Z7902 Long term (current) use of antithrombotics/antiplatelets: Secondary | ICD-10-CM

## 2021-12-09 DIAGNOSIS — Z9861 Coronary angioplasty status: Secondary | ICD-10-CM

## 2021-12-09 DIAGNOSIS — Z79899 Other long term (current) drug therapy: Secondary | ICD-10-CM

## 2021-12-09 DIAGNOSIS — E1122 Type 2 diabetes mellitus with diabetic chronic kidney disease: Secondary | ICD-10-CM | POA: Diagnosis present

## 2021-12-09 DIAGNOSIS — E1169 Type 2 diabetes mellitus with other specified complication: Secondary | ICD-10-CM | POA: Diagnosis present

## 2021-12-09 DIAGNOSIS — E11649 Type 2 diabetes mellitus with hypoglycemia without coma: Secondary | ICD-10-CM | POA: Diagnosis not present

## 2021-12-09 DIAGNOSIS — Z833 Family history of diabetes mellitus: Secondary | ICD-10-CM

## 2021-12-09 DIAGNOSIS — E119 Type 2 diabetes mellitus without complications: Secondary | ICD-10-CM

## 2021-12-09 DIAGNOSIS — N179 Acute kidney failure, unspecified: Secondary | ICD-10-CM | POA: Diagnosis present

## 2021-12-09 DIAGNOSIS — M869 Osteomyelitis, unspecified: Secondary | ICD-10-CM | POA: Diagnosis present

## 2021-12-09 DIAGNOSIS — Z89421 Acquired absence of other right toe(s): Secondary | ICD-10-CM

## 2021-12-09 DIAGNOSIS — E11621 Type 2 diabetes mellitus with foot ulcer: Secondary | ICD-10-CM | POA: Diagnosis present

## 2021-12-09 LAB — COMPREHENSIVE METABOLIC PANEL
ALT: 14 U/L (ref 0–44)
AST: 20 U/L (ref 15–41)
Albumin: 2.8 g/dL — ABNORMAL LOW (ref 3.5–5.0)
Alkaline Phosphatase: 85 U/L (ref 38–126)
Anion gap: 9 (ref 5–15)
BUN: 28 mg/dL — ABNORMAL HIGH (ref 8–23)
CO2: 27 mmol/L (ref 22–32)
Calcium: 9.4 mg/dL (ref 8.9–10.3)
Chloride: 98 mmol/L (ref 98–111)
Creatinine, Ser: 2.13 mg/dL — ABNORMAL HIGH (ref 0.61–1.24)
GFR, Estimated: 30 mL/min — ABNORMAL LOW (ref >60)
Glucose, Bld: 121 mg/dL — ABNORMAL HIGH (ref 70–99)
Potassium: 4.2 mmol/L (ref 3.5–5.1)
Sodium: 134 mmol/L — ABNORMAL LOW (ref 135–145)
Total Bilirubin: 0.7 mg/dL (ref 0.3–1.2)
Total Protein: 8.8 g/dL — ABNORMAL HIGH (ref 6.5–8.1)

## 2021-12-09 LAB — CBC WITH DIFFERENTIAL/PLATELET
Abs Immature Granulocytes: 0.03 10*3/uL (ref 0.00–0.07)
Basophils Absolute: 0.2 10*3/uL — ABNORMAL HIGH (ref 0.0–0.1)
Basophils Relative: 2 %
Eosinophils Absolute: 0.6 10*3/uL — ABNORMAL HIGH (ref 0.0–0.5)
Eosinophils Relative: 6 %
HCT: 30.3 % — ABNORMAL LOW (ref 39.0–52.0)
Hemoglobin: 10.1 g/dL — ABNORMAL LOW (ref 13.0–17.0)
Immature Granulocytes: 0 %
Lymphocytes Relative: 33 %
Lymphs Abs: 3.2 10*3/uL (ref 0.7–4.0)
MCH: 30 pg (ref 26.0–34.0)
MCHC: 33.3 g/dL (ref 30.0–36.0)
MCV: 89.9 fL (ref 80.0–100.0)
Monocytes Absolute: 0.9 10*3/uL (ref 0.1–1.0)
Monocytes Relative: 10 %
Neutro Abs: 4.9 10*3/uL (ref 1.7–7.7)
Neutrophils Relative %: 49 %
Platelets: 299 10*3/uL (ref 150–400)
RBC: 3.37 MIL/uL — ABNORMAL LOW (ref 4.22–5.81)
RDW: 14.6 % (ref 11.5–15.5)
WBC: 9.8 10*3/uL (ref 4.0–10.5)
nRBC: 0 % (ref 0.0–0.2)

## 2021-12-09 LAB — AMMONIA: Ammonia: 19 umol/L (ref 9–35)

## 2021-12-09 LAB — LACTIC ACID, PLASMA: Lactic Acid, Venous: 1.2 mmol/L (ref 0.5–1.9)

## 2021-12-09 MED ORDER — IPRATROPIUM-ALBUTEROL 0.5-2.5 (3) MG/3ML IN SOLN
2.5000 mL | Freq: Four times a day (QID) | RESPIRATORY_TRACT | Status: DC | PRN
Start: 2021-12-09 — End: 2021-12-10

## 2021-12-09 MED ORDER — ASPIRIN 81 MG PO CHEW
81.0000 mg | CHEWABLE_TABLET | Freq: Every day | ORAL | Status: DC
  Administered 2021-12-10 – 2021-12-12 (×3): 81 mg via ORAL
  Filled 2021-12-09 (×3): qty 1

## 2021-12-09 MED ORDER — INSULIN GLARGINE-YFGN 100 UNIT/ML ~~LOC~~ SOLN
10.0000 [IU] | Freq: Every day | SUBCUTANEOUS | Status: DC
  Administered 2021-12-10: 10 [IU] via SUBCUTANEOUS
  Filled 2021-12-09 (×3): qty 0.1

## 2021-12-09 MED ORDER — SODIUM CHLORIDE 0.9 % IV BOLUS
1000.0000 mL | Freq: Once | INTRAVENOUS | Status: AC
Start: 2021-12-09 — End: 2021-12-10
  Administered 2021-12-10: 1000 mL via INTRAVENOUS

## 2021-12-09 MED ORDER — INSULIN ASPART 100 UNIT/ML IJ SOLN: 0.0000 [IU] | Freq: Three times a day (TID) | INTRAMUSCULAR | Status: DC

## 2021-12-09 MED ORDER — LORAZEPAM 2 MG/ML IJ SOLN: 1.0000 mg | INTRAMUSCULAR | Status: DC | PRN

## 2021-12-09 MED ORDER — PANCRELIPASE (LIP-PROT-AMYL) 12000-38000 UNITS PO CPEP
24000.0000 [IU] | ORAL_CAPSULE | Freq: Three times a day (TID) | ORAL | Status: DC
  Administered 2021-12-10 – 2021-12-12 (×7): 24000 [IU] via ORAL
  Filled 2021-12-09 (×10): qty 2

## 2021-12-09 MED ORDER — ATORVASTATIN CALCIUM 40 MG PO TABS
40.0000 mg | ORAL_TABLET | Freq: Every day | ORAL | Status: DC
  Administered 2021-12-10 – 2021-12-12 (×4): 40 mg via ORAL
  Filled 2021-12-09 (×4): qty 1

## 2021-12-09 MED ORDER — CLOPIDOGREL BISULFATE 75 MG PO TABS
75.0000 mg | ORAL_TABLET | Freq: Every day | ORAL | Status: DC
  Administered 2021-12-10 – 2021-12-12 (×3): 75 mg via ORAL
  Filled 2021-12-09 (×3): qty 1

## 2021-12-09 MED ORDER — LORAZEPAM 2 MG/ML IJ SOLN: 1.0000 mg | Freq: Once | INTRAMUSCULAR | Status: DC

## 2021-12-09 MED ORDER — LIDOCAINE 5 % EX OINT
TOPICAL_OINTMENT | Freq: Once | CUTANEOUS | Status: AC
  Filled 2021-12-09: qty 35.44

## 2021-12-09 MED ORDER — CLOTRIMAZOLE 1 % EX CREA
TOPICAL_CREAM | Freq: Two times a day (BID) | CUTANEOUS | Status: DC
Start: 2021-12-09 — End: 2021-12-13
  Filled 2021-12-09 (×2): qty 15

## 2021-12-09 NOTE — Assessment & Plan Note (Signed)
Cont statin

## 2021-12-09 NOTE — Assessment & Plan Note (Addendum)
Pt with increased confusion, possibly due to balanitis, vs osteomyelitis of foot if present. 1. Check UA 2. Ativan PRN agitation if needed 3. Working up osteomyelitis and treating balanitis as below

## 2021-12-09 NOTE — ED Provider Notes (Incomplete)
[facility] EMERGENCY DEPARTMENT Provider Note   CSN: [id number] Arrival date & time: 12/09/21  1816     History {Add pertinent medical, surgical, social history, OB history to HPI:1} Chief Complaint  Patient presents with  . Altered Mental Status    Foot infection     Justin [name] is a 87 y.o. male sent in for evaluation by patient's daughter.  History given by EMS to the nurse.  Information gathered by nursing triage note.  I attempted to collect history from the patient's daughter via telephone however but have been unable to get in touch with her.  Patient has a history of osteomyelitis with right second toe amputation by [name].  Patient was recently admitted to the hospital in August with altered mental status.  He was seen by both vascular surgery and podiatry.  He underwent an angiogram on 11/24/2021.  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  He underwent a right popliteal artery drug-coated angioplasty along with tibial peroneal trunk angioplasty and right peroneal artery angioplasty.  Patient was placed on dual antiplatelet therapy with aspirin and Plavix.  26 November 2021 patient underwent a second toe amputation for osteomyelitis.  According to nursing intake note he is currently at a skilled nursing facility, [facility].  Patient's daughter requested that the patient come in for evaluation for increased confusion.  She is concerned he may have recurrent osteomyelitis.  Patient is currently on doxycycline and saw [name] on 12/01/2021 without evidence of recurrent infection cellulitis or osteo.  He is currently on doxycycline.  And is unable to give any history.  He is complaining of penis pain   Altered Mental Status      Home Medications Prior to Admission medications   Medication Sig Start Date End Date Taking? Authorizing Provider  acetaminophen (TYLENOL)  325 MG tablet Take 650 mg by mouth daily.    [provider]  Alcohol Swabs PADS Use to check blood sugar 3 times daily. Dx: E10.9 07/21/15   Ivar Drape D, PA  Amino Acids-Protein Hydrolys (FEEDING SUPPLEMENT, PRO-STAT SUGAR FREE 64,) LIQD Take 30 mLs by mouth 2 (two) times daily.    [provider]  aspirin 81 MG chewable tablet Chew 81 mg by mouth daily.    [provider]  atorvastatin (LIPITOR) 40 MG tablet Take 40 mg by mouth at bedtime. 11/03/21   [provider]  Blood Glucose Calibration (ACCU-CHEK AVIVA) SOLN Use to check blood sugar 3 times daily. Dx: E10.9 07/21/15   Ivar Drape D, PA  Blood Glucose Monitoring Suppl (ACCU-CHEK AVIVA PLUS) w/Device KIT Use to check blood sugar 3 times daily. Dx: E10.9 09/27/15   Wardell Honour, MD  clopidogrel (PLAVIX) 75 MG tablet Take 1 tablet (75 mg total) by mouth daily with breakfast. 11/28/21   Nita Sells, MD  doxycycline (VIBRA-TABS) 100 MG tablet Take 1 tablet (100 mg total) by mouth 2 (two) times daily. 12/07/21   [name], DPM  fluticasone furoate-vilanterol (BREO ELLIPTA) 100-25 MCG/ACT AEPB Inhale 1 puff into the lungs daily.    [provider]  glucagon (GLUCAGON EMERGENCY) 1 MG injection Inject 1 mg into the muscle once as needed. Patient taking differently: Inject 1 mg into the muscle once as needed (low blood sugar). 09/29/15   Philemon Kingdom, MD  Glucerna (GLUCERNA) LIQD Take 237 mLs by mouth 2 (two) times daily.    [provider]  glucose blood (ACCU-CHEK AVIVA PLUS) test strip Use to check blood sugar 3 times daily. Dx: E10.9 11/17/15   Philemon Kingdom, MD  insulin glargine-yfgn (SEMGLEE) 100 UNIT/ML injection Inject 0.1 mLs (10 Units total) into the skin daily. 11/28/21   Nita Sells, MD  Insulin Pen Needle 32G X 4 MM MISC Use as directed to inject insulin tidwc 12/14/12   Shawnee Knapp, MD  Ipratropium-Albuterol (COMBIVENT) 20-100 MCG/ACT AERS  respimat Inhale 1 puff into the lungs every 6 (six) hours as needed for wheezing. 05/25/21   Patrecia Pour, MD  Lancets (ACCU-CHEK SOFT TOUCH) lancets Use to check blood sugar 3 times daily. Dx: E10.9 07/21/15   Ivar Drape D, PA  lidocaine (LMX) 4 % cream Apply 1 application  topically daily. Apply generously to right lower leg and heels daily-pain.    [provider]  lipase/protease/amylase (CREON) 12000-38000 units CPEP capsule Take 24,000 Units by mouth 3 (three) times daily before meals.    [provider]  loratadine (CLARITIN) 10 MG tablet Take 10 mg by mouth daily.    [provider]  magnesium oxide (MAG-OX) 400 (240 Mg) MG tablet Take 1 tablet (400 mg total) by mouth 2 (two) times daily. 11/28/21   Nita Sells, MD  mineral oil-hydrophilic petrolatum (AQUAPHOR) ointment Apply 1 Application topically See admin instructions. 1 application topical once a day. Mix with lidocaine cream-to feet/toes daily-dry skin    [provider]  NOVOLOG FLEXPEN 100 UNIT/ML FlexPen Inject 3 mLs into the skin See admin instructions. Per sliding scale: If blood sugar is 70 to 100, give 0 units. If blood sugar is 201 to 250, give 2 units. If blood sugar is 251 to 300, give 4 units. If blood sugar is 301 to 350, give 6 units. If blood sugar is 351 to 400, give 8 units. If blood sugar is greater than 400, cal MD. 10/18/21   [provider]  omeprazole (PRILOSEC) 20 MG capsule Take 20 mg by mouth every other day.    [provider]  oxyCODONE-acetaminophen (PERCOCET/ROXICET) 5-325 MG tablet Take 1 tablet by mouth every 6 (six) hours as needed for severe pain. 11/28/21   Nita Sells, MD  Skin Protectants, Misc. (MINERIN CREME EX) Apply 1 application  topically daily. Apply minerin cream to bilateral feet daily. R/t dry/cracked skin    [provider]  Spacer/Aero-Holding [facility] Use as directed with combivent. Ok to dispense any  spacer that works with the inhaler being rxed to pt. 09/20/15   Shawnee Knapp, MD  TRULICITY 1.5 TS/1.7BL SOPN Inject 1.5 mg into the skin once a week. Fridays 05/19/21   [provider]      Allergies    Patient has no known allergies.    Review of Systems   Review of Systems  Physical Exam Updated Vital Signs BP (!) 151/79   Pulse 89   Temp 97.6 F (36.4 C) (Oral)   Resp (!) 21   SpO2 100%  Physical Exam Vitals and nursing note reviewed.  Constitutional:      General: He is not in acute distress.    Appearance: He is well-developed. He is not diaphoretic.  HENT:     Head: Normocephalic and atraumatic.  Eyes:     General: No scleral icterus.    Conjunctiva/sclera: Conjunctivae normal.  Cardiovascular:     Rate and Rhythm: Normal rate and regular rhythm.     Heart sounds: Normal heart sounds.  Pulmonary:  Effort: Pulmonary effort is normal. No respiratory distress.     Breath sounds: Normal breath sounds.  Abdominal:     Palpations: Abdomen is soft.     Tenderness: There is no abdominal tenderness.  Genitourinary:    Comments: Erythema and mild tissue breakdown and bleeding on the glans penis along with smegma under the foreskin consistent with balanitis. Musculoskeletal:     Cervical back: Normal range of motion and neck supple.  Skin:    General: Skin is warm and dry.  Neurological:     Mental Status: He is alert.  Psychiatric:        Behavior: Behavior normal.     ED Results / Procedures / Treatments   Labs (all labs ordered are listed, but only abnormal results are displayed) Labs Reviewed  URINALYSIS, ROUTINE W REFLEX MICROSCOPIC    EKG None  Radiology No results found.  Procedures Procedures  {Document cardiac monitor, telemetry assessment procedure when appropriate:1}  Medications Ordered in ED Medications - No data to display  ED Course/ Medical Decision Making/ A&P Clinical Course as of 12/09/21 2302  Sat Dec 09, 2021  2248  Creatinine(!): 2.13 Creatinine elevated from 1.68-2.13 [AH]  2249 Hemoglobin(!): 10.1 Hemoglobin of 10.1 up from 9.6.  May indicate some hemoconcentration [AH]  2249 Lactic Acid, Venous: 1.2 [AH]  2249 Ammonia: 19 [AH]  2249 WBC: 9.8 No leukocytosis [AH]  2249 CT HEAD WO CONTRAST I visualized and interpreted CT head which shows no acute findings [AH]  2250 DG Foot 2 Views Right I visualized and interpreted 2 view chest x-ray which shows previous second toe amputation and what may be some chronic osteomyelitis of the great toe [AH]  2250 His daughter now at bedside states that he has had an acute change in his mentation over the past 24 hours.  He does have baseline dementia but has been far more confused than normal, asking repetitive questions.  She states that normally his more "with it." [AH]    Clinical Course User Index [AH] Margarita Mail, PA-C                           Medical Decision Making Amount and/or Complexity of Data Reviewed Labs: ordered. Decision-making details documented in ED Course. Radiology: ordered. Decision-making details documented in ED Course.  Risk Prescription drug management. Decision regarding hospitalization.   ***  {Document critical care time when appropriate:1} {Document review of labs and clinical decision tools ie heart score, Chads2Vasc2 etc:1}  {Document your independent review of radiology images, and any outside records:1} {Document your discussion with family members, caretakers, and with consultants:1} {Document social determinants of health affecting pt's care:1} {Document your decision making why or why not admission, treatments were needed:1} Final Clinical Impression(s) / ED Diagnoses Final diagnoses:  None    Rx / DC Orders ED Discharge Orders     None

## 2021-12-09 NOTE — H&P (Shared)
History and Physical    Patient: Justin [name] DOB: 11-21-1934 DOA: 12/09/2021 DOS: the patient was seen and examined on 12/09/2021 PCP: [facility] [place]  Patient coming from: SNF  Chief Complaint:  Chief Complaint  Patient presents with   Altered Mental Status    Foot infection    HPI: Justin [name] is a 87 y.o. male with medical history significant of DM2, HTN, PAD, stroke, CKD 3b  Pt with recent admit 8/10 for osteomyelitis and gangrene of 2nd L toe.  Had toe amputated, had angioplasty and stenting to R popliteal artery, R tibioperoneal Trunk, and R peroneal artery.  Admission complicated by encephalopathy.  EEG suggestive of Cefepime induced neurotoxicity.  Also had worsening creat during that admit from his apparent baseline of 1.8 believed to be due to vanc toxicity so he was switched to LZD.  Today sent in from SNF to ED for increased confusion.  Unable to get in touch with daughter on phone and pt not able to provide much history.  Reportedly: Patient's daughter requested that the patient come in for evaluation for increased confusion.  She is concerned he may have recurrent osteomyelitis.  Patient is currently on doxycycline and saw [name] on 12/01/2021 without evidence of recurrent infection cellulitis or osteo.  Pt currently complaining of penis pain.  No SIRS in ED.  Review of Systems: As mentioned in the history of present illness. All other systems reviewed and are negative. Past Medical History:  Diagnosis Date   Acute metabolic encephalopathy 07/09/270   Arthritis    Diabetes mellitus    HTN (hypertension)    Hypercholesteremia    Iron deficiency    Pancreatitis, acute 06/02/2012   Diagnosed 05/2012    Personal history of colonic adenomas 04/15/2013   04/15/2013 2 diminutive polyps     Stroke [facility])    TIA (transient ischemic attack)    Past Surgical History:  Procedure Laterality Date   ABDOMINAL AORTOGRAM W/LOWER EXTREMITY Right  11/24/2021   Procedure: ABDOMINAL AORTOGRAM W/LOWER EXTREMITY;  Surgeon: Cherre Robins, MD;  Location: [place] CV [facility];  Service: Cardiovascular;  Laterality: Right;   AMPUTATION TOE Right 11/26/2021   Procedure: AMPUTATION TOE;  Surgeon: [name], DPM;  Location: [place];  Service: Podiatry;  Laterality: Right;   ARTERY BIOPSY  02/26/2011   Procedure: BIOPSY TEMPORAL ARTERY;  Surgeon: Willey Blade, MD;  Location: [place];  Service: General;  Laterality: Left;  Left temporal artery biospy   [place]   PERIPHERAL VASCULAR BALLOON ANGIOPLASTY Right 11/24/2021   Procedure: PERIPHERAL VASCULAR BALLOON ANGIOPLASTY;  Surgeon: Cherre Robins, MD;  Location: [place] [facility];  Service: Cardiovascular;  Laterality: Right;  POPLITEAL AND PERONEAL   TEMPORAL ARTERY BIOPSY / LIGATION  02/26/2011   left side   UPPER GASTROINTESTINAL ENDOSCOPY     Social History:  reports that he quit smoking about 31 years ago. His smoking use included cigarettes. He has never used smokeless tobacco. He reports that he does not drink alcohol and does not use drugs.  No Known Allergies  Family History  Problem Relation Age of Onset   Heart disease Mother    Amblyopia Father    Diabetes Sister        x 2   Heart attack Sister     Prior to  Admission medications   Medication Sig Start Date End Date Taking? Authorizing Provider  acetaminophen (TYLENOL) 325 MG tablet Take 650 mg by mouth daily.    [provider]  Alcohol Swabs PADS Use to check blood sugar 3 times daily. Dx: E10.9 07/21/15   Ivar Drape D, PA  Amino Acids-Protein Hydrolys (FEEDING SUPPLEMENT, PRO-STAT SUGAR FREE 64,) LIQD Take 30 mLs by mouth 2 (two) times daily.    [provider]  aspirin 81 MG chewable tablet Chew 81 mg by mouth daily.    [provider]  atorvastatin (LIPITOR) 40 MG tablet Take 40  mg by mouth at bedtime. 11/03/21   [provider]  Blood Glucose Calibration (ACCU-CHEK AVIVA) SOLN Use to check blood sugar 3 times daily. Dx: E10.9 07/21/15   Ivar Drape D, PA  Blood Glucose Monitoring Suppl (ACCU-CHEK AVIVA PLUS) w/Device KIT Use to check blood sugar 3 times daily. Dx: E10.9 09/27/15   Wardell Honour, MD  clopidogrel (PLAVIX) 75 MG tablet Take 1 tablet (75 mg total) by mouth daily with breakfast. 11/28/21   Nita Sells, MD  doxycycline (VIBRA-TABS) 100 MG tablet Take 1 tablet (100 mg total) by mouth 2 (two) times daily. 12/07/21   [name], DPM  fluticasone furoate-vilanterol (BREO ELLIPTA) 100-25 MCG/ACT AEPB Inhale 1 puff into the lungs daily.    [provider]  glucagon (GLUCAGON EMERGENCY) 1 MG injection Inject 1 mg into the muscle once as needed. Patient taking differently: Inject 1 mg into the muscle once as needed (low blood sugar). 09/29/15   Philemon Kingdom, MD  Glucerna (GLUCERNA) LIQD Take 237 mLs by mouth 2 (two) times daily.    [provider]  glucose blood (ACCU-CHEK AVIVA PLUS) test strip Use to check blood sugar 3 times daily. Dx: E10.9 11/17/15   Philemon Kingdom, MD  insulin glargine-yfgn (SEMGLEE) 100 UNIT/ML injection Inject 0.1 mLs (10 Units total) into the skin daily. 11/28/21   Nita Sells, MD  Insulin Pen Needle 32G X 4 MM MISC Use as directed to inject insulin tidwc 12/14/12   Shawnee Knapp, MD  Ipratropium-Albuterol (COMBIVENT) 20-100 MCG/ACT AERS respimat Inhale 1 puff into the lungs every 6 (six) hours as needed for wheezing. 05/25/21   Patrecia Pour, MD  Lancets (ACCU-CHEK SOFT TOUCH) lancets Use to check blood sugar 3 times daily. Dx: E10.9 07/21/15   Ivar Drape D, PA  lidocaine (LMX) 4 % cream Apply 1 application  topically daily. Apply generously to right lower leg and heels daily-pain.    [provider]  lipase/protease/amylase (CREON) 12000-38000 units CPEP capsule Take  24,000 Units by mouth 3 (three) times daily before meals.    [provider]  loratadine (CLARITIN) 10 MG tablet Take 10 mg by mouth daily.    [provider]  magnesium oxide (MAG-OX) 400 (240 Mg) MG tablet Take 1 tablet (400 mg total) by mouth 2 (two) times daily. 11/28/21   Nita Sells, MD  mineral oil-hydrophilic petrolatum (AQUAPHOR) ointment Apply 1 Application topically See admin instructions. 1 application topical once a day. Mix with lidocaine cream-to feet/toes daily-dry skin    [provider]  NOVOLOG FLEXPEN 100 UNIT/ML FlexPen Inject 3 mLs into the skin See admin instructions. Per sliding scale: If blood sugar is 70 to 100, give 0 units. If blood sugar is 201 to 250, give 2 units. If blood sugar is 251 to 300, give 4 units. If blood sugar is 301 to 350, give 6 units.  If blood sugar is 351 to 400, give 8 units. If blood sugar is greater than 400, cal MD. 10/18/21   [provider]  omeprazole (PRILOSEC) 20 MG capsule Take 20 mg by mouth every other day.    [provider]  oxyCODONE-acetaminophen (PERCOCET/ROXICET) 5-325 MG tablet Take 1 tablet by mouth every 6 (six) hours as needed for severe pain. 11/28/21   Nita Sells, MD  Skin Protectants, Misc. (MINERIN CREME EX) Apply 1 application  topically daily. Apply minerin cream to bilateral feet daily. R/t dry/cracked skin    [provider]  Spacer/Aero-Holding [facility] Use as directed with combivent. Ok to dispense any spacer that works with the inhaler being rxed to pt. 09/20/15   Shawnee Knapp, MD  TRULICITY 1.5 ZT/2.4PY SOPN Inject 1.5 mg into the skin once a week. Fridays 05/19/21   [provider]    Physical Exam: Vitals:   12/09/21 1830 12/09/21 1837  BP: (!) 151/79   Pulse: 89   Resp: (!) 21   Temp:  97.6 F (36.4 C)  TempSrc:  Oral  SpO2: 100%    Constitutional: NAD, calm, comfortable Eyes: PERRL, lids and conjunctivae normal ENMT: Mucous  membranes are moist. Posterior pharynx clear of any exudate or lesions.Normal dentition.  Neck: normal, supple, no masses, no thyromegaly Respiratory: clear to auscultation bilaterally, no wheezing, no crackles. Normal respiratory effort. No accessory muscle use.  Cardiovascular: Regular rate and rhythm, no murmurs / rubs / gallops. No extremity edema. 2+ pedal pulses. No carotid bruits.  Abdomen: no tenderness, no masses palpated. No hepatosplenomegaly. Bowel sounds positive.  Musculoskeletal: no clubbing / cyanosis. No joint deformity upper and lower extremities. Good ROM, no contractures. Normal muscle tone.  Skin: Has erythema and mild tissue breakdown and bleeding of glans penis c/w balanitis.   Neurologic: CN 2-12 grossly intact. Sensation intact, DTR normal. Strength 5/5 in all 4.  Psychiatric: Yelling out, confused, but actually somewhat oriented: knows hes in a hospital  Data Reviewed: {Tip this will not be part of the note when signed- Document your independent interpretation of telemetry tracing, EKG, lab, Radiology test or any other diagnostic tests. Add any new diagnostic test ordered today. (Optional):[id number]}   CBC    Component Value Date/Time   WBC 9.8 12/09/2021 2147   RBC 3.37 (L) 12/09/2021 2147   HGB 10.1 (L) 12/09/2021 2147   HCT 30.3 (L) 12/09/2021 2147   PLT 299 12/09/2021 2147   MCV 89.9 12/09/2021 2147   MCV 89.5 12/31/2013 2117   MCH 30.0 12/09/2021 2147   MCHC 33.3 12/09/2021 2147   RDW 14.6 12/09/2021 2147   LYMPHSABS 3.2 12/09/2021 2147   MONOABS 0.9 12/09/2021 2147   EOSABS 0.6 (H) 12/09/2021 2147   BASOSABS 0.2 (H) 12/09/2021 2147   CMP     Component Value Date/Time   NA 134 (L) 12/09/2021 2147   K 4.2 12/09/2021 2147   CL 98 12/09/2021 2147   CO2 27 12/09/2021 2147   GLUCOSE 121 (H) 12/09/2021 2147   BUN 28 (H) 12/09/2021 2147   CREATININE 2.13 (H) 12/09/2021 2147   CREATININE 0.94 09/20/2015 0948   CALCIUM 9.4 12/09/2021 2147   PROT 8.8 (H)  12/09/2021 2147   ALBUMIN 2.8 (L) 12/09/2021 2147   AST 20 12/09/2021 2147   ALT 14 12/09/2021 2147   ALKPHOS 85 12/09/2021 2147   BILITOT 0.7 12/09/2021 2147   GFRNONAA 30 (L) 12/09/2021 2147   GFRNONAA 79 09/15/2015 1117   GFRAA  56 (L) 08/31/2018 2017   GFRAA >89 09/15/2015 1117     Assessment and Plan: Acute encephalopathy Pt with increased confusion, possibly due to balanitis, vs osteomyelitis of foot if present.  Balanitis Continue clotrimazole started in ED.  PAD (peripheral artery disease) ([place]) S/p multiple recent stents just last month to try and salvage foot. Has dopplerable DP pulse in foot today. Continue ASA + Plavix  Chronic kidney disease, stage 3b ([name]) Creat today of 2.1, slightly up from his baseline of 1.8 it looks like, but not enough to qualify as AKI. Getting IVF in ED.  Type 2 diabetes mellitus with stage 3b chronic kidney disease, with long-term current use of insulin ([name]) Lantus 10 daily Sensitive SSI AC  Mixed diabetic hyperlipidemia associated with type 2 diabetes mellitus ([name]) Cont statin  Polyneuropathy due to type 2 diabetes mellitus ([place]) Neurontin got stopped last hospital stay due to risk of worsening encephalopathy.      Advance Care Planning:   Code Status: DNR MOST form in chart from just last week.  Consults: ***  Family Communication: No family in room  Severity of Illness: The appropriate patient status for this patient is OBSERVATION. Observation status is judged to be reasonable and necessary in order to provide the required intensity of service to ensure the patient's safety. The patient's presenting symptoms, physical exam findings, and initial radiographic and laboratory data in the context of their medical condition is felt to place them at decreased risk for further clinical deterioration. Furthermore, it is anticipated that the patient will be medically stable for discharge from the hospital within 2 midnights of  admission.   Author: Etta Quill., DO 12/09/2021 11:31 PM  For on call review [email or web address].

## 2021-12-09 NOTE — Assessment & Plan Note (Signed)
S/p multiple recent stents just last month to try and salvage foot. Has dopplerable DP pulse in foot today. 1. Continue ASA + Plavix

## 2021-12-09 NOTE — ED Provider Notes (Signed)
[facility] EMERGENCY DEPARTMENT Provider Note   CSN: [id number] Arrival date & time: 12/09/21  1816     History {Add pertinent medical, surgical, social history, OB history to HPI:1} Chief Complaint  Patient presents with   Altered Mental Status    Foot infection     Justin [name] is a 87 y.o. male sent in for evaluation by patient's daughter.  History given by EMS to the nurse.  Information gathered by nursing triage note.  I attempted to collect history from the patient's daughter via telephone however but have been unable to get in touch with her.  Patient has a history of osteomyelitis with right second toe amputation by [name].  Patient was recently admitted to the hospital in August with altered mental status.  He was seen by both vascular surgery and podiatry.  He underwent an angiogram on 11/24/2021.  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  He underwent a right popliteal artery drug-coated angioplasty along with tibial peroneal trunk angioplasty and right peroneal artery angioplasty.  Patient was placed on dual antiplatelet therapy with aspirin and Plavix.  26 November 2021 patient underwent a second toe amputation for osteomyelitis.  According to nursing intake note he is currently at a skilled nursing facility, [facility].  Patient's daughter requested that the patient come in for evaluation for increased confusion.  She is concerned he may have recurrent osteomyelitis.  Patient is currently on doxycycline and saw [name] on 12/01/2021 without evidence of recurrent infection cellulitis or osteo.  He is currently on doxycycline.  And is unable to give any history.  He is complaining of penis pain   Altered Mental Status      Home Medications Prior to Admission medications   Medication Sig Start Date End Date Taking? Authorizing Provider  acetaminophen (TYLENOL)  325 MG tablet Take 650 mg by mouth daily.    [provider]  Alcohol Swabs PADS Use to check blood sugar 3 times daily. Dx: E10.9 07/21/15   Ivar Drape D, PA  Amino Acids-Protein Hydrolys (FEEDING SUPPLEMENT, PRO-STAT SUGAR FREE 64,) LIQD Take 30 mLs by mouth 2 (two) times daily.    [provider]  aspirin 81 MG chewable tablet Chew 81 mg by mouth daily.    [provider]  atorvastatin (LIPITOR) 40 MG tablet Take 40 mg by mouth at bedtime. 11/03/21   [provider]  Blood Glucose Calibration (ACCU-CHEK AVIVA) SOLN Use to check blood sugar 3 times daily. Dx: E10.9 07/21/15   Ivar Drape D, PA  Blood Glucose Monitoring Suppl (ACCU-CHEK AVIVA PLUS) w/Device KIT Use to check blood sugar 3 times daily. Dx: E10.9 09/27/15   Wardell Honour, MD  clopidogrel (PLAVIX) 75 MG tablet Take 1 tablet (75 mg total) by mouth daily with breakfast. 11/28/21   Nita Sells, MD  doxycycline (VIBRA-TABS) 100 MG tablet Take 1 tablet (100 mg total) by mouth 2 (two) times daily. 12/07/21   [name], DPM  fluticasone furoate-vilanterol (BREO ELLIPTA) 100-25 MCG/ACT AEPB Inhale 1 puff into the lungs daily.    [provider]  glucagon (GLUCAGON EMERGENCY) 1 MG injection Inject 1 mg into the muscle once as needed. Patient taking differently: Inject 1 mg into the muscle once as needed (low blood sugar). 09/29/15   Philemon Kingdom, MD  Glucerna (GLUCERNA) LIQD Take 237 mLs by mouth 2 (two) times daily.    [provider]  glucose blood (ACCU-CHEK AVIVA PLUS) test strip Use to check blood sugar 3 times daily. Dx: E10.9 11/17/15   Philemon Kingdom, MD  insulin glargine-yfgn (SEMGLEE) 100 UNIT/ML injection Inject 0.1 mLs (10 Units total) into the skin daily. 11/28/21   Nita Sells, MD  Insulin Pen Needle 32G X 4 MM MISC Use as directed to inject insulin tidwc 12/14/12   Shawnee Knapp, MD  Ipratropium-Albuterol (COMBIVENT) 20-100 MCG/ACT AERS  respimat Inhale 1 puff into the lungs every 6 (six) hours as needed for wheezing. 05/25/21   Patrecia Pour, MD  Lancets (ACCU-CHEK SOFT TOUCH) lancets Use to check blood sugar 3 times daily. Dx: E10.9 07/21/15   Ivar Drape D, PA  lidocaine (LMX) 4 % cream Apply 1 application  topically daily. Apply generously to right lower leg and heels daily-pain.    [provider]  lipase/protease/amylase (CREON) 12000-38000 units CPEP capsule Take 24,000 Units by mouth 3 (three) times daily before meals.    [provider]  loratadine (CLARITIN) 10 MG tablet Take 10 mg by mouth daily.    [provider]  magnesium oxide (MAG-OX) 400 (240 Mg) MG tablet Take 1 tablet (400 mg total) by mouth 2 (two) times daily. 11/28/21   Nita Sells, MD  mineral oil-hydrophilic petrolatum (AQUAPHOR) ointment Apply 1 Application topically See admin instructions. 1 application topical once a day. Mix with lidocaine cream-to feet/toes daily-dry skin    [provider]  NOVOLOG FLEXPEN 100 UNIT/ML FlexPen Inject 3 mLs into the skin See admin instructions. Per sliding scale: If blood sugar is 70 to 100, give 0 units. If blood sugar is 201 to 250, give 2 units. If blood sugar is 251 to 300, give 4 units. If blood sugar is 301 to 350, give 6 units. If blood sugar is 351 to 400, give 8 units. If blood sugar is greater than 400, cal MD. 10/18/21   [provider]  omeprazole (PRILOSEC) 20 MG capsule Take 20 mg by mouth every other day.    [provider]  oxyCODONE-acetaminophen (PERCOCET/ROXICET) 5-325 MG tablet Take 1 tablet by mouth every 6 (six) hours as needed for severe pain. 11/28/21   Nita Sells, MD  Skin Protectants, Misc. (MINERIN CREME EX) Apply 1 application  topically daily. Apply minerin cream to bilateral feet daily. R/t dry/cracked skin    [provider]  Spacer/Aero-Holding [facility] Use as directed with combivent. Ok to dispense any  spacer that works with the inhaler being rxed to pt. 09/20/15   Shawnee Knapp, MD  TRULICITY 1.5 EH/2.1YY SOPN Inject 1.5 mg into the skin once a week. Fridays 05/19/21   [provider]      Allergies    Patient has no known allergies.    Review of Systems   Review of Systems  Physical Exam Updated Vital Signs BP (!) 151/79   Pulse 89   Temp 97.6 F (36.4 C) (Oral)   Resp (!) 21   SpO2 100%  Physical Exam Vitals and nursing note reviewed.  Constitutional:      General: He is not in acute distress.    Appearance: He is well-developed. He is not diaphoretic.  HENT:     Head: Normocephalic and atraumatic.  Eyes:     General: No scleral icterus.    Conjunctiva/sclera: Conjunctivae normal.  Cardiovascular:     Rate and Rhythm: Normal rate and regular rhythm.     Heart sounds: Normal heart sounds.  Pulmonary:  Effort: Pulmonary effort is normal. No respiratory distress.     Breath sounds: Normal breath sounds.  Abdominal:     Palpations: Abdomen is soft.     Tenderness: There is no abdominal tenderness.  Genitourinary:    Comments: Erythema and mild tissue breakdown and bleeding on the glans penis along with smegma under the foreskin consistent with balanitis. Musculoskeletal:     Cervical back: Normal range of motion and neck supple.  Skin:    General: Skin is warm and dry.  Neurological:     Mental Status: He is alert.  Psychiatric:        Behavior: Behavior normal.     ED Results / Procedures / Treatments   Labs (all labs ordered are listed, but only abnormal results are displayed) Labs Reviewed  URINALYSIS, ROUTINE W REFLEX MICROSCOPIC    EKG None  Radiology No results found.  Procedures Procedures  {Document cardiac monitor, telemetry assessment procedure when appropriate:1}  Medications Ordered in ED Medications - No data to display  ED Course/ Medical Decision Making/ A&P                           Medical Decision Making Amount  and/or Complexity of Data Reviewed Labs: ordered.   ***  {Document critical care time when appropriate:1} {Document review of labs and clinical decision tools ie heart score, Chads2Vasc2 etc:1}  {Document your independent review of radiology images, and any outside records:1} {Document your discussion with family members, caretakers, and with consultants:1} {Document social determinants of health affecting pt's care:1} {Document your decision making why or why not admission, treatments were needed:1} Final Clinical Impression(s) / ED Diagnoses Final diagnoses:  None    Rx / DC Orders ED Discharge Orders     None

## 2021-12-09 NOTE — Assessment & Plan Note (Signed)
Continue clotrimazole started in ED.

## 2021-12-09 NOTE — ED Triage Notes (Signed)
PT BIB [facility] from [facility] because family is concerned that pt is more confused than normal.  They are also concerned that he may have a foot infection.  Per EMS the pt saw a foot specialist yesterday who did not endorse any infection but family has some kind of prescription from a different doctor, EMS states maybe it was for doxy.    EMs VS 150/80 99% RA, HR 90, RR 18 temp 97.3 CBG 186

## 2021-12-09 NOTE — Assessment & Plan Note (Signed)
Lantus 10 daily Sensitive SSI AC

## 2021-12-09 NOTE — Assessment & Plan Note (Signed)
Creat today of 2.1, slightly up from his baseline of 1.8 it looks like, but not enough to qualify as AKI. 1. Getting IVF in ED.

## 2021-12-09 NOTE — Assessment & Plan Note (Addendum)
Neurontin got stopped last hospital stay due to risk of worsening encephalopathy.

## 2021-12-10 ENCOUNTER — Observation Stay (HOSPITAL_COMMUNITY): Payer: Medicare Other

## 2021-12-10 DIAGNOSIS — Z66 Do not resuscitate: Secondary | ICD-10-CM | POA: Diagnosis present

## 2021-12-10 DIAGNOSIS — E11621 Type 2 diabetes mellitus with foot ulcer: Secondary | ICD-10-CM | POA: Diagnosis present

## 2021-12-10 DIAGNOSIS — E782 Mixed hyperlipidemia: Secondary | ICD-10-CM | POA: Diagnosis present

## 2021-12-10 DIAGNOSIS — E1169 Type 2 diabetes mellitus with other specified complication: Secondary | ICD-10-CM | POA: Diagnosis present

## 2021-12-10 DIAGNOSIS — N1832 Chronic kidney disease, stage 3b: Secondary | ICD-10-CM | POA: Diagnosis present

## 2021-12-10 DIAGNOSIS — L97519 Non-pressure chronic ulcer of other part of right foot with unspecified severity: Secondary | ICD-10-CM | POA: Diagnosis present

## 2021-12-10 DIAGNOSIS — N481 Balanitis: Secondary | ICD-10-CM | POA: Diagnosis present

## 2021-12-10 DIAGNOSIS — N179 Acute kidney failure, unspecified: Secondary | ICD-10-CM | POA: Diagnosis present

## 2021-12-10 DIAGNOSIS — R4182 Altered mental status, unspecified: Secondary | ICD-10-CM | POA: Diagnosis not present

## 2021-12-10 DIAGNOSIS — E1142 Type 2 diabetes mellitus with diabetic polyneuropathy: Secondary | ICD-10-CM | POA: Diagnosis present

## 2021-12-10 DIAGNOSIS — Z8249 Family history of ischemic heart disease and other diseases of the circulatory system: Secondary | ICD-10-CM | POA: Diagnosis not present

## 2021-12-10 DIAGNOSIS — Z87891 Personal history of nicotine dependence: Secondary | ICD-10-CM | POA: Diagnosis not present

## 2021-12-10 DIAGNOSIS — E1151 Type 2 diabetes mellitus with diabetic peripheral angiopathy without gangrene: Secondary | ICD-10-CM | POA: Diagnosis present

## 2021-12-10 DIAGNOSIS — M86179 Other acute osteomyelitis, unspecified ankle and foot: Secondary | ICD-10-CM | POA: Diagnosis not present

## 2021-12-10 DIAGNOSIS — E11628 Type 2 diabetes mellitus with other skin complications: Secondary | ICD-10-CM | POA: Diagnosis present

## 2021-12-10 DIAGNOSIS — Z833 Family history of diabetes mellitus: Secondary | ICD-10-CM | POA: Diagnosis not present

## 2021-12-10 DIAGNOSIS — I96 Gangrene, not elsewhere classified: Secondary | ICD-10-CM | POA: Diagnosis not present

## 2021-12-10 DIAGNOSIS — Z9861 Coronary angioplasty status: Secondary | ICD-10-CM | POA: Diagnosis not present

## 2021-12-10 DIAGNOSIS — Z794 Long term (current) use of insulin: Secondary | ICD-10-CM | POA: Diagnosis not present

## 2021-12-10 DIAGNOSIS — M869 Osteomyelitis, unspecified: Secondary | ICD-10-CM | POA: Diagnosis present

## 2021-12-10 DIAGNOSIS — I129 Hypertensive chronic kidney disease with stage 1 through stage 4 chronic kidney disease, or unspecified chronic kidney disease: Secondary | ICD-10-CM | POA: Diagnosis present

## 2021-12-10 DIAGNOSIS — G9341 Metabolic encephalopathy: Secondary | ICD-10-CM | POA: Diagnosis present

## 2021-12-10 DIAGNOSIS — E11649 Type 2 diabetes mellitus with hypoglycemia without coma: Secondary | ICD-10-CM | POA: Diagnosis not present

## 2021-12-10 DIAGNOSIS — E1122 Type 2 diabetes mellitus with diabetic chronic kidney disease: Secondary | ICD-10-CM | POA: Diagnosis present

## 2021-12-10 DIAGNOSIS — L089 Local infection of the skin and subcutaneous tissue, unspecified: Secondary | ICD-10-CM | POA: Diagnosis not present

## 2021-12-10 DIAGNOSIS — Z89421 Acquired absence of other right toe(s): Secondary | ICD-10-CM | POA: Diagnosis not present

## 2021-12-10 DIAGNOSIS — G9340 Encephalopathy, unspecified: Secondary | ICD-10-CM | POA: Diagnosis present

## 2021-12-10 DIAGNOSIS — Z8673 Personal history of transient ischemic attack (TIA), and cerebral infarction without residual deficits: Secondary | ICD-10-CM | POA: Diagnosis not present

## 2021-12-10 DIAGNOSIS — F05 Delirium due to known physiological condition: Secondary | ICD-10-CM | POA: Diagnosis present

## 2021-12-10 LAB — URINALYSIS, ROUTINE W REFLEX MICROSCOPIC
Bilirubin Urine: NEGATIVE
Glucose, UA: NEGATIVE mg/dL
Ketones, ur: NEGATIVE mg/dL
Nitrite: NEGATIVE
Protein, ur: 100 mg/dL — AB
Specific Gravity, Urine: 1.011 (ref 1.005–1.030)
WBC, UA: >50 WBC/hpf — ABNORMAL HIGH (ref 0–5)
pH: 7 (ref 5.0–8.0)

## 2021-12-10 LAB — BASIC METABOLIC PANEL
Anion gap: 7 (ref 5–15)
BUN: 23 mg/dL (ref 8–23)
CO2: 27 mmol/L (ref 22–32)
Calcium: 9.1 mg/dL (ref 8.9–10.3)
Chloride: 101 mmol/L (ref 98–111)
Creatinine, Ser: 1.79 mg/dL — ABNORMAL HIGH (ref 0.61–1.24)
GFR, Estimated: 37 mL/min — ABNORMAL LOW (ref >60)
Glucose, Bld: 97 mg/dL (ref 70–99)
Potassium: 4.5 mmol/L (ref 3.5–5.1)
Sodium: 135 mmol/L (ref 135–145)

## 2021-12-10 LAB — GLUCOSE, CAPILLARY
Glucose-Capillary: 85 mg/dL (ref 70–99)
Glucose-Capillary: 52 mg/dL — ABNORMAL LOW (ref 70–99)
Glucose-Capillary: 46 mg/dL — ABNORMAL LOW (ref 70–99)
Glucose-Capillary: 137 mg/dL — ABNORMAL HIGH (ref 70–99)

## 2021-12-10 LAB — CBC
HCT: 31.2 % — ABNORMAL LOW (ref 39.0–52.0)
Hemoglobin: 10.1 g/dL — ABNORMAL LOW (ref 13.0–17.0)
MCH: 29.7 pg (ref 26.0–34.0)
MCHC: 32.4 g/dL (ref 30.0–36.0)
MCV: 91.8 fL (ref 80.0–100.0)
Platelets: 268 10*3/uL (ref 150–400)
RBC: 3.4 MIL/uL — ABNORMAL LOW (ref 4.22–5.81)
RDW: 14.6 % (ref 11.5–15.5)
WBC: 10.3 10*3/uL (ref 4.0–10.5)
nRBC: 0 % (ref 0.0–0.2)

## 2021-12-10 LAB — HEMOGLOBIN A1C
Hgb A1c MFr Bld: 8.9 % — ABNORMAL HIGH (ref 4.8–5.6)
Mean Plasma Glucose: 208.73 mg/dL

## 2021-12-10 LAB — CBG MONITORING, ED
Glucose-Capillary: 49 mg/dL — ABNORMAL LOW (ref 70–99)
Glucose-Capillary: 59 mg/dL — ABNORMAL LOW (ref 70–99)
Glucose-Capillary: 72 mg/dL (ref 70–99)
Glucose-Capillary: 121 mg/dL — ABNORMAL HIGH (ref 70–99)

## 2021-12-10 LAB — VITAMIN B12: Vitamin B-12: 558 pg/mL (ref 180–914)

## 2021-12-10 LAB — C-REACTIVE PROTEIN: CRP: 1.2 mg/dL — ABNORMAL HIGH (ref <1.0)

## 2021-12-10 LAB — SEDIMENTATION RATE: Sed Rate: 96 mm/hr — ABNORMAL HIGH (ref 0–16)

## 2021-12-10 LAB — VITAMIN D 25 HYDROXY (VIT D DEFICIENCY, FRACTURES): Vit D, 25-Hydroxy: 40.29 ng/mL (ref 30–100)

## 2021-12-10 MED ORDER — GUAIFENESIN 100 MG/5ML PO LIQD: 5.0000 mL | ORAL | Status: DC | PRN

## 2021-12-10 MED ORDER — ACETAMINOPHEN 325 MG PO TABS: 650.0000 mg | ORAL_TABLET | Freq: Four times a day (QID) | ORAL | Status: DC | PRN

## 2021-12-10 MED ORDER — ACETAMINOPHEN 325 MG PO TABS: 650.0000 mg | ORAL_TABLET | Freq: Every day | ORAL | Status: DC | PRN

## 2021-12-10 MED ORDER — DEXTROSE 50 % IV SOLN
25.0000 g | INTRAVENOUS | Status: AC
  Administered 2021-12-10: 25 g via INTRAVENOUS
  Filled 2021-12-10: qty 50

## 2021-12-10 MED ORDER — INSULIN ASPART 100 UNIT/ML IJ SOLN
0.0000 [IU] | INTRAMUSCULAR | Status: DC
  Administered 2021-12-11 (×2): 2 [IU] via SUBCUTANEOUS
  Administered 2021-12-11: 1 [IU] via SUBCUTANEOUS
  Administered 2021-12-12: 3 [IU] via SUBCUTANEOUS
  Administered 2021-12-12 (×2): 2 [IU] via SUBCUTANEOUS
  Administered 2021-12-12: 1 [IU] via SUBCUTANEOUS

## 2021-12-10 MED ORDER — LORAZEPAM 1 MG PO TABS
1.0000 mg | ORAL_TABLET | Freq: Once | ORAL | Status: AC | PRN
  Administered 2021-12-10: 1 mg via ORAL
  Filled 2021-12-10: qty 1

## 2021-12-10 MED ORDER — ACETAMINOPHEN 650 MG RE SUPP: 650.0000 mg | Freq: Four times a day (QID) | RECTAL | Status: DC | PRN

## 2021-12-10 MED ORDER — CEFTRIAXONE SODIUM 1 G IJ SOLR
1.0000 g | Freq: Every day | INTRAMUSCULAR | Status: DC
  Administered 2021-12-10 – 2021-12-11 (×2): 1 g via INTRAVENOUS
  Filled 2021-12-10 (×2): qty 10

## 2021-12-10 MED ORDER — PANCRELIPASE (LIP-PROT-AMYL) 24000-76000 UNITS PO CPEP: 1.0000 | ORAL_CAPSULE | Freq: Three times a day (TID) | ORAL | Status: DC

## 2021-12-10 MED ORDER — TRAZODONE HCL 50 MG PO TABS
50.0000 mg | ORAL_TABLET | Freq: Every evening | ORAL | Status: DC | PRN
  Administered 2021-12-10 – 2021-12-11 (×2): 50 mg via ORAL
  Filled 2021-12-10 (×3): qty 1

## 2021-12-10 MED ORDER — DOXYCYCLINE HYCLATE 100 MG PO TABS
100.0000 mg | ORAL_TABLET | Freq: Two times a day (BID) | ORAL | Status: DC
  Administered 2021-12-10 – 2021-12-12 (×7): 100 mg via ORAL
  Filled 2021-12-10 (×7): qty 1

## 2021-12-10 MED ORDER — PANTOPRAZOLE SODIUM 40 MG PO TBEC
40.0000 mg | DELAYED_RELEASE_TABLET | Freq: Every day | ORAL | Status: DC
  Administered 2021-12-10 – 2021-12-12 (×3): 40 mg via ORAL
  Filled 2021-12-10 (×3): qty 1

## 2021-12-10 MED ORDER — HYDRALAZINE HCL 20 MG/ML IJ SOLN: 10.0000 mg | INTRAMUSCULAR | Status: DC | PRN

## 2021-12-10 MED ORDER — METOPROLOL TARTRATE 5 MG/5ML IV SOLN: 5.0000 mg | INTRAVENOUS | Status: DC | PRN

## 2021-12-10 MED ORDER — HEPARIN SODIUM (PORCINE) 5000 UNIT/ML IJ SOLN
5000.0000 [IU] | Freq: Three times a day (TID) | INTRAMUSCULAR | Status: DC
  Administered 2021-12-10 – 2021-12-12 (×9): 5000 [IU] via SUBCUTANEOUS
  Filled 2021-12-10 (×9): qty 1

## 2021-12-10 MED ORDER — IPRATROPIUM-ALBUTEROL 0.5-2.5 (3) MG/3ML IN SOLN: 3.0000 mL | RESPIRATORY_TRACT | Status: DC | PRN

## 2021-12-10 MED ORDER — SENNOSIDES-DOCUSATE SODIUM 8.6-50 MG PO TABS: 1.0000 | ORAL_TABLET | Freq: Every evening | ORAL | Status: DC | PRN

## 2021-12-10 MED ORDER — ONDANSETRON HCL 4 MG PO TABS: 4.0000 mg | ORAL_TABLET | Freq: Four times a day (QID) | ORAL | Status: DC | PRN

## 2021-12-10 MED ORDER — ONDANSETRON HCL 4 MG/2ML IJ SOLN: 4.0000 mg | Freq: Four times a day (QID) | INTRAMUSCULAR | Status: DC | PRN

## 2021-12-10 MED ORDER — OXYCODONE-ACETAMINOPHEN 5-325 MG PO TABS: 0.5000 | ORAL_TABLET | ORAL | Status: DC | PRN

## 2021-12-10 MED ORDER — LORAZEPAM 2 MG/ML IJ SOLN
1.0000 mg | INTRAMUSCULAR | Status: DC | PRN
Start: 2021-12-10 — End: 2021-12-13
  Administered 2021-12-10 – 2021-12-11 (×4): 1 mg via INTRAVENOUS
  Filled 2021-12-10 (×4): qty 1

## 2021-12-10 NOTE — Consult Note (Signed)
Subjective:  Patient ID: Justin [name], male    DOB: [birth year],  MRN: [id number]  Patient with past medical history of encephalotpay, DM 2, HTN, HLD, Stroke seen at beside today for worsening mental state and pain in the right foot. Patient is alert to self today but not able to communicate much currently. Unable to get into contact with daughter currently. Patient does relates pain in the foot. MRI preformed and podiatry consulted for worsening right foot infection.    Past Medical History:  Diagnosis Date   Acute metabolic encephalopathy 11/17/9145   Arthritis    Diabetes mellitus    HTN (hypertension)    Hypercholesteremia    Iron deficiency    Pancreatitis, acute 06/02/2012   Diagnosed 05/2012    Personal history of colonic adenomas 04/15/2013   04/15/2013 2 diminutive polyps     Stroke [facility])    TIA (transient ischemic attack)      Past Surgical History:  Procedure Laterality Date   ABDOMINAL AORTOGRAM W/LOWER EXTREMITY Right 11/24/2021   Procedure: ABDOMINAL AORTOGRAM W/LOWER EXTREMITY;  Surgeon: Cherre Robins, MD;  Location: [place] [facility];  Service: Cardiovascular;  Laterality: Right;   AMPUTATION TOE Right 11/26/2021   Procedure: AMPUTATION TOE;  Surgeon: [name], DPM;  Location: [place];  Service: Podiatry;  Laterality: Right;   ARTERY BIOPSY  02/26/2011   Procedure: BIOPSY TEMPORAL ARTERY;  Surgeon: Willey Blade, MD;  Location: [place];  Service: General;  Laterality: Left;  Left temporal artery biospy   [place]   PERIPHERAL VASCULAR BALLOON ANGIOPLASTY Right 11/24/2021   Procedure: PERIPHERAL VASCULAR BALLOON ANGIOPLASTY;  Surgeon: Cherre Robins, MD;  Location: [place] [facility];  Service: Cardiovascular;  Laterality: Right;  POPLITEAL AND PERONEAL   TEMPORAL ARTERY BIOPSY / LIGATION  02/26/2011   left side   UPPER GASTROINTESTINAL ENDOSCOPY          Latest Ref Rng & Units 12/10/2021    7:10 AM 12/09/2021    9:47 PM 11/28/2021    3:12 AM  CBC  WBC 4.0 - 10.5 K/uL 10.3  9.8  13.7   Hemoglobin 13.0 - 17.0 g/dL 10.1  10.1  9.6   Hematocrit 39.0 - 52.0 % 31.2  30.3  28.5   Platelets 150 - 400 K/uL 268  299  337        Latest Ref Rng & Units 12/09/2021    9:47 PM 11/28/2021    3:12 AM 11/27/2021    8:20 AM  BMP  Glucose 70 - 99 mg/dL 121  139  161   BUN 8 - 23 mg/dL '28  11  14   '$ Creatinine 0.61 - 1.24 mg/dL 2.13  1.68  1.74   Sodium 135 - 145 mmol/L 134  136  134   Potassium 3.5 - 5.1 mmol/L 4.2  3.6  4.0   Chloride 98 - 111 mmol/L 98  106  104   CO2 22 - 32 mmol/L '27  22  22   '$ Calcium 8.9 - 10.3 mg/dL 9.4  8.4  8.2      Objective:   Vitals:   12/10/21 1200 12/10/21 1505  BP: 120/75 125/64  Pulse:  78  Resp: 17 19  Temp:  98.1 F (36.7 C)  SpO2:  98%    General:AA&O x 3. Normal mood and  affect   Vascular: DP and PT pulses 2/4 bilateral. Brisk capillary refill to all digits. Pedal hair present   Neruological. Epicritic sensation grossly intact.   Derm:  right second toe incision in area of previous amputation. There is erythema and edema noted around incision and tracking laterally to the third metatarsal and dorsally. No purulence noted.  No probe to bone. Interspaces clears of maceration.        MSK: MMT 5/5 in dorsiflexion, plantar flexion, inversion and eversion. Normal joint ROM without pain or crepitus.   IMPRESSION: Postsurgical changes of second toe amputation. Marrow signal abnormality and cortical irregularity at the second and third metatarsal heads and third toe proximal phalanx consistent with early osteomyelitis. Soft tissue swelling without evidence of abscess.   Minimal marrow signal abnormality dorsal aspect of the great toe metatarsal head at the MTP joint with preserved T1 marrow signal and trace first MTP joint fluid, may suggest subacute osteomyelitis given cortical regularity on recent CT.    Mild marrow edema in the adjacent first MTP lateral sesamoid, potentially early osteomyelitis or reactive change.       Assessment & Plan:  Patient was evaluated and treated and all questions answered.  DX: Right foot osteomyelitis Wound care: betadine, DSD  Antibiotics: Per primary  DME: Post-op shoe   Discussed with patient diagnosis and treatment options. Patient not fully aware of conversation it appears. Did discuss need for further amputation and patient upset by this. Will plan to talk to patients family. Attempted to call but no answer.   Imaging reviewed. MRI showing early osteomyelitis in second and third metatarsal.  Patient will either need transmetatarsal or plan for palliative care at this point. Will plan to discuss with family hopefully by tomorrow.   Podiatry will continue to follow.   Lorenda Peck, DPM  Accessible via secure chat for questions or concerns.

## 2021-12-10 NOTE — ED Notes (Signed)
Pt's cbg 59. Will give pt orange/food and recheck.

## 2021-12-10 NOTE — ED Notes (Signed)
Patient transported to MRI 

## 2021-12-10 NOTE — Progress Notes (Signed)
PROGRESS NOTE    Justin [name]  MOQ:[id number] DOB: 03-Dec-1934 DOA: 12/09/2021 PCP: [facility]   Brief Narrative:  87 year old with history of DM2, HTN, PAD, CVA, CKD stage IIIb admitted to the hospital on 8/10 for osteomyelitis and gangrene of the second left toe.  He underwent toe amputation, had angioplasty and stenting of his right lower extremity.  Hospital course also complicated by encephalopathy likely suspected due to cefepime and worsening creatinine eventually switched vancomycin to linezolid.  He was discharged to SNF where he started experiencing confusion therefore brought him back to the hospital.  Currently he has been on outpatient doxycycline.   Assessment & Plan:  Principal Problem:   Acute encephalopathy Active Problems:   Diabetic foot infection ([name])   Balanitis   PAD (peripheral artery disease) ([name])   Chronic kidney disease, stage 3b ([name])   Type 2 diabetes mellitus with stage 3b chronic kidney disease, with long-term current use of insulin ([name])   Mixed diabetic hyperlipidemia associated with type 2 diabetes mellitus ([name])   Polyneuropathy due to type 2 diabetes mellitus ([place])     Assessment and Plan: * Acute encephalopathy Urinary tract infection Wonder if this was delirium on underlying infection related.  This morning his mentation is back to baseline.  CT of the head is negative. UA-showing UTI, send urine cultures. IV Rocephin TSH, B12 and folate  Diabetic foot infection ([place]) Recently underwent amputation of the left toe.  Concerns of right foot osteomyelitis.  Recently underwent PCI as well to his right foot.  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 Check MRI right foot Antibiotics-doxycycline 100 mg twice daily  Balanitis Clotrimazole twice daily  PAD (peripheral artery disease) ([name]) Previous history of CVA Recent PCI.  Does have palpable pulses.  Aspirin, Plavix and statin  Chronic kidney disease, stage 3b  ([name]) Baseline creatinine 1.8.  Admission creatinine 2.13, pending a.m. labs  Type 2 diabetes mellitus with stage 3b chronic kidney disease, with long-term current use of insulin ([name]) Lantus 10 units daily, sliding scale and Accu-Cheks A1c 8.9.  Consult diabetic coordinator  Mixed diabetic hyperlipidemia associated with type 2 diabetes mellitus ([name]) Statin  Polyneuropathy due to type 2 diabetes mellitus ([place]) Was on gabapentin but discontinued due to encephalopathy during previous admission   DVT prophylaxis: SQ Heparin Code Status: DNR Family Communication:    Mentation slightly better but foot looks infected, needs Abx and through eval. May need ID consult.      Subjective: Still very weak. Mentation appears better. Poor historian    Examination:  General exam: Appears calm and comfortable. Chronically Ill.  Respiratory system: Clear to auscultation. Respiratory effort normal. Cardiovascular system: S1 & S2 heard, RRR. No JVD, murmurs, rubs, gallops or clicks. No pedal edema. Gastrointestinal system: Abdomen is nondistended, soft and nontender. No organomegaly or masses felt. Normal bowel sounds heard. Central nervous system: Alert and oriented. No focal neurological deficits. Extremities: Symmetric 5 x 5 power. Skin: b/l LE erythema and skin breakdown. There is also some evidence of Balanitis.  Psychiatry: Judgement and insight appear Poor    Objective: Vitals:   12/10/21 0508 12/10/21 0630 12/10/21 0631 12/10/21 0700  BP:  (!) 151/84 (!) 151/84 138/72  Pulse:   77   Resp:   16   Temp:      TempSrc:      SpO2: 100%  97%     Intake/Output Summary (Last 24 hours) at 12/10/2021 0915 Last data filed at 12/10/2021 3546 Gross per  24 hour  Intake --  Output 100 ml  Net -100 ml   There were no vitals filed for this visit.   Data Reviewed:   CBC: Recent Labs  Lab 12/09/21 2147 12/10/21 0710  WBC 9.8 10.3  NEUTROABS 4.9  --   HGB 10.1* 10.1*  HCT 30.3*  31.2*  MCV 89.9 91.8  PLT 299 626   Basic Metabolic Panel: Recent Labs  Lab 12/09/21 2147  NA 134*  K 4.2  CL 98  CO2 27  GLUCOSE 121*  BUN 28*  CREATININE 2.13*  CALCIUM 9.4   GFR: CrCl cannot be calculated (Unknown ideal weight.). Liver Function Tests: Recent Labs  Lab 12/09/21 2147  AST 20  ALT 14  ALKPHOS 85  BILITOT 0.7  PROT 8.8*  ALBUMIN 2.8*   No results for input(s): "LIPASE", "AMYLASE" in the last 168 hours. Recent Labs  Lab 12/09/21 2147  AMMONIA 19   Coagulation Profile: No results for input(s): "INR", "PROTIME" in the last 168 hours. Cardiac Enzymes: No results for input(s): "CKTOTAL", "CKMB", "CKMBINDEX", "TROPONINI" in the last 168 hours. BNP (last 3 results) No results for input(s): "PROBNP" in the last 8760 hours. HbA1C: Recent Labs    12/10/21 0006  HGBA1C 8.9*   CBG: Recent Labs  Lab 12/10/21 0838 12/10/21 0909  GLUCAP 59* 49*   Lipid Profile: No results for input(s): "CHOL", "HDL", "LDLCALC", "TRIG", "CHOLHDL", "LDLDIRECT" in the last 72 hours. Thyroid Function Tests: No results for input(s): "TSH", "T4TOTAL", "FREET4", "T3FREE", "THYROIDAB" in the last 72 hours. Anemia Panel: No results for input(s): "VITAMINB12", "FOLATE", "FERRITIN", "TIBC", "IRON", "RETICCTPCT" in the last 72 hours. Sepsis Labs: Recent Labs  Lab 12/09/21 2147  LATICACIDVEN 1.2    No results found for this or any previous visit (from the past 240 hour(s)).       Radiology Studies: CT Foot Right Wo Contrast  Result Date: 12/10/2021 CLINICAL DATA:  Foot swelling, diabetic, osteomyelitis suspected, xray done EXAM: CT OF THE RIGHT FOOT WITHOUT CONTRAST TECHNIQUE: Multidetector CT imaging of the right foot was performed according to the standard protocol. Multiplanar CT image reconstructions were also generated. RADIATION DOSE REDUCTION: This exam was performed according to the departmental dose-optimization program which includes automated exposure  control, adjustment of the mA and/or kV according to patient size and/or use of iterative reconstruction technique. COMPARISON:  None Available. FINDINGS: Bones/Joint/Cartilage Amputation of the second digit distal to the metatarsophalangeal joint. The third, fourth, and fifth digits are held in fixed dorsiflexion. Hammertoe deformity of the great toe. There are erosive changes involving the dorsal, periarticular cortices of the first metatarsal head and base of the first proximal phalanx in keeping with changes of osteomyelitis/septic arthritis, best seen on image # 64/8. Erosive changes involving the distal tuft of the great appear chronic in nature. There is a shallow ulcer involving the distal aspect of the great toe best seen on sagittal image # 66/10. Erosive changes are seen within the second metatarsal head and undersurface of the third metatarsal head suspicious for changes of focal osteomyelitis in these locations. There is extensive surrounding soft tissue swelling, particularly with involving the plantar soft tissues. The osseous structures are diffusely osteopenic.  No acute fracture. Ligaments Suboptimally assessed by CT. Muscles and Tendons The intrinsic musculature of the foot is markedly atrophic. Flexor, extensor, and peroneal tendons appear intact. Soft tissues Advanced vascular calcifications are seen within the left foot. No subcutaneous fluid collection is identified. IMPRESSION: 1. Findings in keeping with osteomyelitis/septic arthritis  involving the first metatarsal head and base of the first proximal phalanx. 2. Erosive changes involving the second metatarsal head and undersurface of the third metatarsal head suspicious for changes of focal osteomyelitis in these locations. 3. Shallow ulcer involving the distal aspect of the great toe with extensive surrounding soft tissue swelling, particularly involving the plantar soft tissues. 4. Diffuse osteopenia. 5. Advanced vascular calcifications.  6. Marked atrophy of the intrinsic musculature of the foot. Electronically Signed   By: Fidela Salisbury M.D.   On: 12/10/2021 03:42   CT HEAD WO CONTRAST  Result Date: 12/09/2021 CLINICAL DATA:  Altered mental status EXAM: CT HEAD WITHOUT CONTRAST TECHNIQUE: Contiguous axial images were obtained from the base of the skull through the vertex without intravenous contrast. RADIATION DOSE REDUCTION: This exam was performed according to the departmental dose-optimization program which includes automated exposure control, adjustment of the mA and/or kV according to patient size and/or use of iterative reconstruction technique. COMPARISON:  11/19/2021 FINDINGS: Brain: No acute intracranial findings are seen. There are no signs of bleeding within the cranium. There is marked prominence of cortical sulci. There is decreased density in periventricular and subcortical white matter. No focal mass effect is seen. Vascular: There are scattered arterial calcifications. Skull: No fracture is seen in calvarium. Sinuses/Orbits: There is mucosal thickening in frontal, ethmoid and sphenoid sinuses. Other: None. IMPRESSION: No acute intracranial findings are seen. Atrophy. Small vessel disease. Arteriosclerosis.  Chronic sinusitis. Electronically Signed   By: Elmer Picker M.D.   On: 12/09/2021 19:55   DG Foot 2 Views Right  Result Date: 12/09/2021 CLINICAL DATA:  Evaluate for osteomyelitis. EXAM: RIGHT FOOT - 2 VIEW COMPARISON:  Right foot radiograph dated 12/01/2021. FINDINGS: Status post amputation of the second phalanges. There is no acute fracture or dislocation. The bones are osteopenic. Erosive changes of the tuft of the distal phalanx of the great toe as well as erosive changes of the ankle, likely chronic. There is diffuse vascular calcification. Subcutaneous edema and soft tissue swelling of the dorsum of the forefoot. No radiopaque foreign object or subcu gas. IMPRESSION: 1. Status post amputation of the second  phalanges. No acute fracture or dislocation. 2. Erosive changes of the tuft of the distal phalanx of the great toe and ankle, likely chronic. MRI or a white blood cell nuclear scan may provide better evaluation if there is clinical concern for acute osteomyelitis. Electronically Signed   By: Anner Crete M.D.   On: 12/09/2021 19:37        Scheduled Meds:  aspirin  81 mg Oral Daily   atorvastatin  40 mg Oral QHS   clopidogrel  75 mg Oral Q breakfast   clotrimazole   Topical BID   doxycycline  100 mg Oral BID   heparin  5,000 Units Subcutaneous Q8H   insulin aspart  0-9 Units Subcutaneous TID WC   insulin glargine-yfgn  10 Units Subcutaneous Daily   lipase/protease/amylase  24,000 Units Oral TID AC   pantoprazole  40 mg Oral Daily   Continuous Infusions:   LOS: 0 days   Time spent= 35 mins    Justin Glaeser Arsenio Loader, MD [facility]  If 7PM-7AM, please contact night-coverage  12/10/2021, 9:15 AM

## 2021-12-10 NOTE — Plan of Care (Signed)
  Problem: Clinical Measurements: Goal: Ability to maintain clinical measurements within normal limits will improve Outcome: Progressing   Problem: Elimination: Goal: Will not experience complications related to bowel motility Outcome: Progressing Goal: Will not experience complications related to urinary retention Outcome: Progressing   Problem: Safety: Goal: Ability to remain free from injury will improve Outcome: Progressing

## 2021-12-10 NOTE — ED Notes (Signed)
Pt agitated, yelling, pulling at iv and monitoring devices; Dr.Gardener notified

## 2021-12-10 NOTE — ED Notes (Signed)
Pt's daughter updated.

## 2021-12-10 NOTE — ED Notes (Signed)
Pt resting comfortably at this time; respirations even and unlabored

## 2021-12-10 NOTE — Assessment & Plan Note (Addendum)
Question of diabetic foot infection / osteomyelitis of R foot. No subQ air on X ray No SIRS 1. MRI of R foot pending 2. ESR and CRP pending 3. Bone / wound culture from osteo last admit grew out MRSA 4. Continue doxycycline for the moment (just started this as outpt it looks like), MRSA was sensitive to tetracycline. 5. If osteomyelitis confirmed or suspected based on MRI, or if he develops SIRS, foot looks worse, etc.  Would put him on either Linezolid (short term) or tigecycline for MRSA coverage (Vanc caused AKI during last admit).

## 2021-12-10 NOTE — Progress Notes (Signed)
NEW ADMISSION NOTE New Admission Note:   Arrival Method: ED stretcher Mental Orientation: AAOx1 Telemetry: 5M13 Assessment: Completed Skin: See assessment IV: LFA Pain: 0/10 Tubes: n/a Safety Measures: Safety Fall Prevention Plan has been given, discussed and signed Admission: Completed [facility] Orientation: Patient has been orientated to the room, unit and staff.  Family: none at bedside  Orders have been reviewed and implemented. Will continue to monitor the patient. Call light has been placed within reach and bed alarm has been activated.   Vira Agar, RN

## 2021-12-11 DIAGNOSIS — E1122 Type 2 diabetes mellitus with diabetic chronic kidney disease: Secondary | ICD-10-CM | POA: Diagnosis not present

## 2021-12-11 DIAGNOSIS — L089 Local infection of the skin and subcutaneous tissue, unspecified: Secondary | ICD-10-CM

## 2021-12-11 DIAGNOSIS — R4182 Altered mental status, unspecified: Secondary | ICD-10-CM

## 2021-12-11 DIAGNOSIS — G9340 Encephalopathy, unspecified: Secondary | ICD-10-CM | POA: Diagnosis not present

## 2021-12-11 DIAGNOSIS — M86179 Other acute osteomyelitis, unspecified ankle and foot: Secondary | ICD-10-CM | POA: Diagnosis not present

## 2021-12-11 DIAGNOSIS — I96 Gangrene, not elsewhere classified: Secondary | ICD-10-CM

## 2021-12-11 DIAGNOSIS — E11628 Type 2 diabetes mellitus with other skin complications: Secondary | ICD-10-CM

## 2021-12-11 DIAGNOSIS — N1832 Chronic kidney disease, stage 3b: Secondary | ICD-10-CM

## 2021-12-11 DIAGNOSIS — Z794 Long term (current) use of insulin: Secondary | ICD-10-CM

## 2021-12-11 LAB — CBC
HCT: 33.3 % — ABNORMAL LOW (ref 39.0–52.0)
Hemoglobin: 11.1 g/dL — ABNORMAL LOW (ref 13.0–17.0)
MCH: 29.6 pg (ref 26.0–34.0)
MCHC: 33.3 g/dL (ref 30.0–36.0)
MCV: 88.8 fL (ref 80.0–100.0)
Platelets: 321 10*3/uL (ref 150–400)
RBC: 3.75 MIL/uL — ABNORMAL LOW (ref 4.22–5.81)
RDW: 14.6 % (ref 11.5–15.5)
WBC: 8.9 10*3/uL (ref 4.0–10.5)
nRBC: 0 % (ref 0.0–0.2)

## 2021-12-11 LAB — BASIC METABOLIC PANEL
Anion gap: 8 (ref 5–15)
BUN: 19 mg/dL (ref 8–23)
CO2: 27 mmol/L (ref 22–32)
Calcium: 9.2 mg/dL (ref 8.9–10.3)
Chloride: 100 mmol/L (ref 98–111)
Creatinine, Ser: 1.61 mg/dL — ABNORMAL HIGH (ref 0.61–1.24)
GFR, Estimated: 42 mL/min — ABNORMAL LOW (ref >60)
Glucose, Bld: 116 mg/dL — ABNORMAL HIGH (ref 70–99)
Potassium: 4.5 mmol/L (ref 3.5–5.1)
Sodium: 135 mmol/L (ref 135–145)

## 2021-12-11 LAB — FOLATE: Folate: 11.1 ng/mL (ref >5.9)

## 2021-12-11 LAB — GLUCOSE, CAPILLARY
Glucose-Capillary: 115 mg/dL — ABNORMAL HIGH (ref 70–99)
Glucose-Capillary: 131 mg/dL — ABNORMAL HIGH (ref 70–99)
Glucose-Capillary: 179 mg/dL — ABNORMAL HIGH (ref 70–99)
Glucose-Capillary: 169 mg/dL — ABNORMAL HIGH (ref 70–99)
Glucose-Capillary: 130 mg/dL — ABNORMAL HIGH (ref 70–99)

## 2021-12-11 LAB — URINE CULTURE: Culture: NO GROWTH

## 2021-12-11 LAB — TSH: TSH: 1.964 u[IU]/mL (ref 0.350–4.500)

## 2021-12-11 LAB — MAGNESIUM: Magnesium: 2.2 mg/dL (ref 1.7–2.4)

## 2021-12-11 MED ORDER — CEFADROXIL 500 MG PO CAPS
500.0000 mg | ORAL_CAPSULE | Freq: Two times a day (BID) | ORAL | Status: DC
  Filled 2021-12-11: qty 1

## 2021-12-11 MED ORDER — SODIUM CHLORIDE 0.9 % IV SOLN: INTRAVENOUS | Status: AC

## 2021-12-11 NOTE — Progress Notes (Signed)
PROGRESS NOTE    Justin [name]  ERX:[id number] DOB: May 21, 1934 DOA: 12/09/2021 PCP: [facility]   Brief Narrative:  87 year old with history of DM2, HTN, PAD, CVA, CKD stage IIIb admitted to the hospital on 8/10 for osteomyelitis and gangrene of the second left toe.  He underwent toe amputation, had angioplasty and stenting of his right lower extremity.  Hospital course also complicated by encephalopathy likely suspected due to cefepime and worsening creatinine eventually switched vancomycin to linezolid.  He was discharged to SNF where he started experiencing confusion therefore brought him back to the hospital.  Currently he has been on outpatient doxycycline.  MRI was consistent with osteomyelitis therefore infectious disease and podiatry were consulted.   Assessment & Plan:  Principal Problem:   Acute encephalopathy Active Problems:   Diabetic foot infection ([name])   Balanitis   PAD (peripheral artery disease) ([name])   Chronic kidney disease, stage 3b ([name])   Type 2 diabetes mellitus with stage 3b chronic kidney disease, with long-term current use of insulin ([name])   Mixed diabetic hyperlipidemia associated with type 2 diabetes mellitus ([name])   Polyneuropathy due to type 2 diabetes mellitus ([place])     Assessment and Plan: * Acute encephalopathy Urinary tract infection Wonder if this was delirium on underlying infection related.  This morning his mentation is back to baseline.  CT of the head is negative. UA-showing UTI, cultures pending. IV Rocephin TSH, B12 and folate-normal  Diabetic foot infection ([place]), right foot Recently underwent amputation of the left toe.   Recently underwent PCI as well to his right foot.  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 MRI of foot consistent with right foot osteomyelitis Podiatry and ID consulted likely need surgical intervention. Should we broaden Abx for now? Will allow ID to weigh in.  Antibiotics-doxycycline 100 mg  twice daily  Balanitis Clotrimazole twice daily  PAD (peripheral artery disease) ([name]) Previous history of CVA Recent PCI.  Does have palpable pulses.  Aspirin, Plavix and statin  Chronic kidney disease, stage 3b ([name]) Baseline creatinine 1.8.  Admission creatinine 2.13, improving, creatinine 1.6 today  Type 2 diabetes mellitus with stage 3b chronic kidney disease, with long-term current use of insulin ([name]) Due to recurrent and persistent hypoglycemia, long-acting discontinued for now.  Sliding scale and Accu-Cheks. A1c 8.9.  Consult diabetic coordinator  Mixed diabetic hyperlipidemia associated with type 2 diabetes mellitus ([name]) Statin  Polyneuropathy due to type 2 diabetes mellitus ([place]) Was on gabapentin but discontinued due to encephalopathy during previous admission   DVT prophylaxis: SQ Heparin Code Status: DNR Family Communication: Met with the daughter at bedside  Ongoing discussion regarding surgical intervention for his foot.  ID and podiatry consulted  Subjective: Seen and examined at bedside.  Daughter is also present. Very poor oral intake.  Overall able to answer all the basic questions. Briefly we discussed risks and benefits of the surgery.  Daughter to discuss with rest of the family regarding progressing with aggressive care or taking conservative approach.  She understands that her father is sick and at risk of recurrent complications  Also had an episode of hypoglycemia overnight  Examination: Constitutional: Not in acute distress but appears chronically ill Respiratory: Clear to auscultation bilaterally Cardiovascular: Normal sinus rhythm, no rubs Abdomen: Nontender nondistended good bowel sounds Musculoskeletal: No edema noted Skin: Bilateral lower extremity chronic skin changes noted.  Some warmth and erythema felt of his right foot.  Amputated toe noted Neurologic: CN 2-12 grossly intact.  And nonfocal  Psychiatric: Poor judgment and insight.   Alert to name     Objective: Vitals:   12/10/21 1200 12/10/21 1505 12/10/21 2109 12/11/21 0556  BP: 120/75 125/64 (!) 156/96 (!) 145/81  Pulse:  78 83 89  Resp: _0 Temp:  98.1 F (36.7 C) 98.4 F (36.9 C) 98.4 F (36.9 C)  TempSrc:  Oral    SpO2:  98% 93% 97%  Weight:  68.6 kg    Height:  _1  (1.88 m)      Intake/Output Summary (Last 24 hours) at 12/11/2021 0741 Last data filed at 12/11/2021 0601 Gross per 24 hour  Intake 337.66 ml  Output 0 ml  Net 337.66 ml   Filed Weights   12/10/21 1505  Weight: 68.6 kg     Data Reviewed:   CBC: Recent Labs  Lab 12/09/21 2147 12/10/21 0710 12/11/21 0312  WBC 9.8 10.3 8.9  NEUTROABS 4.9  --   --   HGB 10.1* 10.1* 11.1*  HCT 30.3* 31.2* 33.3*  MCV 89.9 91.8 88.8  PLT 299 268 326   Basic Metabolic Panel: Recent Labs  Lab 12/09/21 2147 12/10/21 1543 12/11/21 0312  NA 134* 135 135  K 4.2 4.5 4.5  CL 98 101 100  CO2 _2 GLUCOSE 121* 97 116*  BUN 28* 23 19  CREATININE 2.13* 1.79* 1.61*  CALCIUM 9.4 9.1 9.2  MG  --   --  2.2   GFR: Estimated Creatinine Clearance: 32.5 mL/min (A) (by C-G formula based on SCr of 1.61 mg/dL (H)). Liver Function Tests: Recent Labs  Lab 12/09/21 2147  AST 20  ALT 14  ALKPHOS 85  BILITOT 0.7  PROT 8.8*  ALBUMIN 2.8*   No results for input(s): "LIPASE", "AMYLASE" in the last 168 hours. Recent Labs  Lab 12/09/21 2147  AMMONIA 19   Coagulation Profile: No results for input(s): "INR", "PROTIME" in the last 168 hours. Cardiac Enzymes: No results for input(s): "CKTOTAL", "CKMB", "CKMBINDEX", "TROPONINI" in the last 168 hours. BNP (last 3 results) No results for input(s): "PROBNP" in the last 8760 hours. HbA1C: Recent Labs    12/10/21 0006  HGBA1C 8.9*   CBG: Recent Labs  Lab 12/10/21 2108 12/10/21 2156 12/10/21 2311 12/11/21 0556 12/11/21 0736  GLUCAP 52* 46* 137* 115* 131*   Lipid Profile: No results for input(s): "CHOL", "HDL", "LDLCALC",  "TRIG", "CHOLHDL", "LDLDIRECT" in the last 72 hours. Thyroid Function Tests: Recent Labs    12/10/21 1543  TSH 1.964   Anemia Panel: Recent Labs    12/10/21 1543 12/11/21 0312  VITAMINB12 558  --   FOLATE  --  11.1   Sepsis Labs: Recent Labs  Lab 12/09/21 2147  LATICACIDVEN 1.2    No results found for this or any previous visit (from the past 240 hour(s)).       Radiology Studies: MR FOOT RIGHT WO CONTRAST  Result Date: 12/10/2021 CLINICAL DATA:  Foot swelling, nondiabetic, osteomyelitis suspected EXAM: MRI OF THE RIGHT FOREFOOT WITHOUT CONTRAST TECHNIQUE: Multiplanar, multisequence MR imaging of the right forefoot was performed. No intravenous contrast was administered. COMPARISON:  CT 12/10/2021 FINDINGS: Bones/Joint/Cartilage Posterior changes of second toe amputation. There is marrow edema and mildly lower T1 signal in the second and third metatarsal heads with cortical irregularity. There is marrow edema in the third toe proximal phalanx Preserved T1 marrow signal within the great toe phalanges and first metatarsal, with minimal dorsal sided subcortical edema signal in the periarticular bone at  the great toe MTP joint and trace MTP joint fluid. There is minimal marrow edema in the great toe lateral sesamoid with preserved T1 marrow signal. Chronic erosive change of the distal tuft. Ligaments Intact Lisfranc ligament. Muscles and Tendons Diffuse intramuscular edema and atrophy in the foot as is commonly seen in diabetics. Soft tissues Soft tissue changes in the medial forefoot related to recent second toe amputation. There is diffuse soft tissue swelling of the foot. IMPRESSION: Postsurgical changes of second toe amputation. Marrow signal abnormality and cortical irregularity at the second and third metatarsal heads and third toe proximal phalanx consistent with early osteomyelitis. Soft tissue swelling without evidence of abscess. Minimal marrow signal abnormality dorsal aspect of  the great toe metatarsal head at the MTP joint with preserved T1 marrow signal and trace first MTP joint fluid, may suggest subacute osteomyelitis given cortical regularity on recent CT. Mild marrow edema in the adjacent first MTP lateral sesamoid, potentially early osteomyelitis or reactive change. Electronically Signed   By: Maurine Simmering M.D.   On: 12/10/2021 11:41   CT Foot Right Wo Contrast  Result Date: 12/10/2021 CLINICAL DATA:  Foot swelling, diabetic, osteomyelitis suspected, xray done EXAM: CT OF THE RIGHT FOOT WITHOUT CONTRAST TECHNIQUE: Multidetector CT imaging of the right foot was performed according to the standard protocol. Multiplanar CT image reconstructions were also generated. RADIATION DOSE REDUCTION: This exam was performed according to the departmental dose-optimization program which includes automated exposure control, adjustment of the mA and/or kV according to patient size and/or use of iterative reconstruction technique. COMPARISON:  None Available. FINDINGS: Bones/Joint/Cartilage Amputation of the second digit distal to the metatarsophalangeal joint. The third, fourth, and fifth digits are held in fixed dorsiflexion. Hammertoe deformity of the great toe. There are erosive changes involving the dorsal, periarticular cortices of the first metatarsal head and base of the first proximal phalanx in keeping with changes of osteomyelitis/septic arthritis, best seen on image # 64/8. Erosive changes involving the distal tuft of the great appear chronic in nature. There is a shallow ulcer involving the distal aspect of the great toe best seen on sagittal image # 66/10. Erosive changes are seen within the second metatarsal head and undersurface of the third metatarsal head suspicious for changes of focal osteomyelitis in these locations. There is extensive surrounding soft tissue swelling, particularly with involving the plantar soft tissues. The osseous structures are diffusely osteopenic.  No  acute fracture. Ligaments Suboptimally assessed by CT. Muscles and Tendons The intrinsic musculature of the foot is markedly atrophic. Flexor, extensor, and peroneal tendons appear intact. Soft tissues Advanced vascular calcifications are seen within the left foot. No subcutaneous fluid collection is identified. IMPRESSION: 1. Findings in keeping with osteomyelitis/septic arthritis involving the first metatarsal head and base of the first proximal phalanx. 2. Erosive changes involving the second metatarsal head and undersurface of the third metatarsal head suspicious for changes of focal osteomyelitis in these locations. 3. Shallow ulcer involving the distal aspect of the great toe with extensive surrounding soft tissue swelling, particularly involving the plantar soft tissues. 4. Diffuse osteopenia. 5. Advanced vascular calcifications. 6. Marked atrophy of the intrinsic musculature of the foot. Electronically Signed   By: Fidela Salisbury M.D.   On: 12/10/2021 03:42   CT HEAD WO CONTRAST  Result Date: 12/09/2021 CLINICAL DATA:  Altered mental status EXAM: CT HEAD WITHOUT CONTRAST TECHNIQUE: Contiguous axial images were obtained from the base of the skull through the vertex without intravenous contrast. RADIATION DOSE REDUCTION: This exam  was performed according to the departmental dose-optimization program which includes automated exposure control, adjustment of the mA and/or kV according to patient size and/or use of iterative reconstruction technique. COMPARISON:  11/19/2021 FINDINGS: Brain: No acute intracranial findings are seen. There are no signs of bleeding within the cranium. There is marked prominence of cortical sulci. There is decreased density in periventricular and subcortical white matter. No focal mass effect is seen. Vascular: There are scattered arterial calcifications. Skull: No fracture is seen in calvarium. Sinuses/Orbits: There is mucosal thickening in frontal, ethmoid and sphenoid sinuses.  Other: None. IMPRESSION: No acute intracranial findings are seen. Atrophy. Small vessel disease. Arteriosclerosis.  Chronic sinusitis. Electronically Signed   By: Elmer Picker M.D.   On: 12/09/2021 19:55   DG Foot 2 Views Right  Result Date: 12/09/2021 CLINICAL DATA:  Evaluate for osteomyelitis. EXAM: RIGHT FOOT - 2 VIEW COMPARISON:  Right foot radiograph dated 12/01/2021. FINDINGS: Status post amputation of the second phalanges. There is no acute fracture or dislocation. The bones are osteopenic. Erosive changes of the tuft of the distal phalanx of the great toe as well as erosive changes of the ankle, likely chronic. There is diffuse vascular calcification. Subcutaneous edema and soft tissue swelling of the dorsum of the forefoot. No radiopaque foreign object or subcu gas. IMPRESSION: 1. Status post amputation of the second phalanges. No acute fracture or dislocation. 2. Erosive changes of the tuft of the distal phalanx of the great toe and ankle, likely chronic. MRI or a white blood cell nuclear scan may provide better evaluation if there is clinical concern for acute osteomyelitis. Electronically Signed   By: Anner Crete M.D.   On: 12/09/2021 19:37        Scheduled Meds:  aspirin  81 mg Oral Daily   atorvastatin  40 mg Oral QHS   clopidogrel  75 mg Oral Q breakfast   clotrimazole   Topical BID   doxycycline  100 mg Oral BID   heparin  5,000 Units Subcutaneous Q8H   insulin aspart  0-9 Units Subcutaneous Q4H   lipase/protease/amylase  24,000 Units Oral TID AC   pantoprazole  40 mg Oral Daily   Continuous Infusions:  cefTRIAXone (ROCEPHIN)  IV Stopped (12/10/21 1112)     LOS: 1 day   Time spent= 35 mins    Lalani Winkles Arsenio Loader, MD [facility]  If 7PM-7AM, please contact night-coverage  12/11/2021, 7:41 AM

## 2021-12-11 NOTE — Consult Note (Signed)
[facility]    Date of Admission:  12/09/2021     Reason for Consult: confusion, concern for diabetic foot infection    Referring Provider: Reesa Chew     Abx: 9/3-c ceftriaxone Outpatient doxy continued        Assessment: Ams ?infection vs other Recent diabetic foot infection 2nd toe om s/p disarticulation; cx mrsa dm2  Pvd  I am unclear what the underlying reason for ams is I suspect it is probably delirium. I do not see obvious active cellulitis in the foot and I suspect the new mri changes could be explained by ongoing dry gangrene process  Ucx negative; no bcx obtained but no sepsis physiology otherwise, and ams already improving quickly   Certainly could be om changes too which would require biopsy/cx for definitive dx  But given his significant PAD I am not sure the benefit is in his favor with more local foot debridement or tma  I agree with dr Jacqualyn Posey maybe a trial of long term abx is worth while before further decision is made, with vascular input   Plan: Can change abx to doxy and cefadroxil Agree could discuss case with vascular surgery I think it is reasonable risk/benefit to trial 4 week oral abx starting today with the combination mentioned before further decision regarding surgery is made if improvement is lacking Discussed with primary team/podiatry    I spent 60 minute reviewing data/chart, and coordinating care and >50% direct face to face time providing counseling/discussing diagnostics/treatment plan with patient       ------------------------------------------------ Principal Problem:   Acute encephalopathy Active Problems:   Type 2 diabetes mellitus with stage 3b chronic kidney disease, with long-term current use of insulin ([place])   Chronic kidney disease, stage 3b ([place])   Mixed diabetic hyperlipidemia associated with type 2 diabetes mellitus ([place])   Polyneuropathy due to type 2 diabetes mellitus ([place])    Diabetic foot infection ([place])   PAD (peripheral artery disease) ([place])   Balanitis    HPI: Justin [name] is a 87 y.o. male pmh DM2, HTN, PAD, stroke, CKD 3b, recent gangrene 2nd left toe s/p    Pt with recent admit 8/10 for osteomyelitis and gangrene of right 2nd toe.  The 2nd toe was disarticulated at mtp joint; there was purulence found  He also had angioplasty on 8/18 during that admission and stent too R popliteal artery, R tibioperoneal Trunk, and R peroneal artery.   Other relevant info include some concern for vanc toxicity vs aki of other reason and vanc was switched to linezolid. He had confusion multifactorial and was also on cefepime at that time. ID service wasn't consulted at that time. Patient had abx stopped on 8/21 prior to discharge. Cx ultimately grew mrsa  He was discharged to snf and sent back on 9/2 for concern of confusion no fever  Per chart patient was given doxy by dr Jacqualyn Posey on 8/25 although note mentioned no concern for cellulitis/wound break down or reported subjective f/c    No fever here or leukocytosis  Podiatry seeing and at this time no definitive decision but all options on table   Family History  Problem Relation Age of Onset   Heart disease Mother    Amblyopia Father    Diabetes Sister        x 2   Heart attack Sister     Social History   Tobacco Use   Smoking status:  Former    Years: 35.00    Types: Cigarettes    Quit date: 04/09/1990    Years since quitting: 31.6   Smokeless tobacco: Never  Substance Use Topics   Alcohol use: No    Comment: "per pt stopped drinking in the 90's"   Drug use: No    No Known Allergies  Review of Systems: ROS All Other ROS was negative, except mentioned above   Past Medical History:  Diagnosis Date   Acute metabolic encephalopathy 1/61/0960   Arthritis    Diabetes mellitus    HTN (hypertension)    Hypercholesteremia    Iron deficiency    Pancreatitis, acute 06/02/2012   Diagnosed 05/2012     Personal history of colonic adenomas 04/15/2013   04/15/2013 2 diminutive polyps     Stroke ([name])    TIA (transient ischemic attack)        Scheduled Meds:  aspirin  81 mg Oral Daily   atorvastatin  40 mg Oral QHS   clopidogrel  75 mg Oral Q breakfast   clotrimazole   Topical BID   doxycycline  100 mg Oral BID   heparin  5,000 Units Subcutaneous Q8H   insulin aspart  0-9 Units Subcutaneous Q4H   lipase/protease/amylase  24,000 Units Oral TID AC   pantoprazole  40 mg Oral Daily   Continuous Infusions:  cefTRIAXone (ROCEPHIN)  IV 1 g (12/11/21 0946)   PRN Meds:.acetaminophen **OR** acetaminophen, guaiFENesin, hydrALAZINE, ipratropium-albuterol, LORazepam, metoprolol tartrate, ondansetron **OR** ondansetron (ZOFRAN) IV, oxyCODONE-acetaminophen, senna-docusate, traZODone   OBJECTIVE: Blood pressure (!) 135/97, pulse 90, temperature 98.3 F (36.8 C), resp. rate 18, height '6\' 2"'$  (1.88 m), weight 68.6 kg, SpO2 95 %.  Physical Exam  General/constitutional: no distress, pleasant, sleeping HEENT: Normocephalic Neck supple CV: rrr no mrg Lungs: clear to auscultation, normal respiratory effort Abd: Soft, Nontender Ext: no edema Skin/msk: No Rash right foot incision at 2nd toe intact no purulence. No erythema/swelling/fluctuance Neuro: deferred as sleeping and doesn't want to be awoken     Lab Results Lab Results  Component Value Date   WBC 8.9 12/11/2021   HGB 11.1 (L) 12/11/2021   HCT 33.3 (L) 12/11/2021   MCV 88.8 12/11/2021   PLT 321 12/11/2021    Lab Results  Component Value Date   CREATININE 1.61 (H) 12/11/2021   BUN 19 12/11/2021   NA 135 12/11/2021   K 4.5 12/11/2021   CL 100 12/11/2021   CO2 27 12/11/2021    Lab Results  Component Value Date   ALT 14 12/09/2021   AST 20 12/09/2021   ALKPHOS 85 12/09/2021   BILITOT 0.7 12/09/2021      Microbiology: Recent Results (from the past 240 hour(s))  Urine Culture     Status: None   Collection Time:  12/10/21  2:39 PM   Specimen: Urine, Clean Catch  Result Value Ref Range Status   Specimen Description URINE, CLEAN CATCH  Final   Special Requests NONE  Final   Culture   Final    NO GROWTH Performed at [facility], [place] [address] [place], [place] [address]    Report Status 12/11/2021 FINAL  Final     Serology:    Imaging: If present, new imagings (plain films, ct scans, and mri) have been personally visualized and interpreted; radiology reports have been reviewed. Decision making incorporated into the Impression / Recommendations.  8/9 mri right foot Distal great toe ulceration with erosion of the distal phalangeal tuft and  questionable minimal edema signal at the tip of the residual distal phalanx. There is largely preserved marrow signal. Findings could reflect very early osteomyelitis or reactive marrow change. No evidence of soft tissue abscess.  9/3 mri right foot Postsurgical changes of second toe amputation. Marrow signal abnormality and cortical irregularity at the second and third metatarsal heads and third toe proximal phalanx consistent with early osteomyelitis. Soft tissue swelling without evidence of abscess.   Minimal marrow signal abnormality dorsal aspect of the great toe metatarsal head at the MTP joint with preserved T1 marrow signal and trace first MTP joint fluid, may suggest subacute osteomyelitis given cortical regularity on recent CT.   Mild marrow edema in the adjacent first MTP lateral sesamoid, potentially early osteomyelitis or reactive change.  Jabier Mutton, [facility] [place] [phone] pager    12/11/2021, 11:03 AM

## 2021-12-11 NOTE — Progress Notes (Signed)
Pt CBG-52, RN gave pt 8oz of orange juice. Recheck CBG-46. RN gave him 8oz of grape juice and made the on call MD Ninetta Lights) aware. MD placed new orders.

## 2021-12-11 NOTE — Progress Notes (Signed)
Podiatry: daughter [name] had to leave she said to call her number is on file.

## 2021-12-11 NOTE — Progress Notes (Signed)
PODIATRY PROGRESS NOTE  NAME Justin [name] MRN [id number] DOB 1934/10/02 DOA 12/09/2021   Reason for consult:  Chief Complaint  Patient presents with   Altered Mental Status    Foot infection      History of present illness: 87 y.o. male admitted for altered mental status changes.  MRI of the right foot showed new osteomyelitis.  Patient was last seen in the office by myself onAugust 25.  Afterwards the cultures from prior surgery showed MRSA and he was started on doxycycline.   Vitals:   12/11/21 0556 12/11/21 0916  BP: (!) 145/81 (!) 135/97  Pulse: 89 90  Resp: 18 18  Temp: 98.4 F (36.9 C) 98.3 F (36.8 C)  SpO2: 97% 95%       Latest Ref Rng & Units 12/11/2021    3:12 AM 12/10/2021    7:10 AM 12/09/2021    9:47 PM  CBC  WBC 4.0 - 10.5 K/uL 8.9  10.3  9.8   Hemoglobin 13.0 - 17.0 g/dL 11.1  10.1  10.1   Hematocrit 39.0 - 52.0 % 33.3  31.2  30.3   Platelets 150 - 400 K/uL 321  268  299        Latest Ref Rng & Units 12/11/2021    3:12 AM 12/10/2021    3:43 PM 12/09/2021    9:47 PM  BMP  Glucose 70 - 99 mg/dL 116  97  121   BUN 8 - 23 mg/dL '19  23  28   '$ Creatinine 0.61 - 1.24 mg/dL 1.61  1.79  2.13   Sodium 135 - 145 mmol/L 135  135  134   Potassium 3.5 - 5.1 mmol/L 4.5  4.5  4.2   Chloride 98 - 111 mmol/L 100  101  98   CO2 22 - 32 mmol/L '27  27  27   '$ Calcium 8.9 - 10.3 mg/dL 9.2  9.1  9.4       Physical Exam: General: NAD   Dermatology: Incision from prior second toe potation with sutures intact.  Ulceration present on the lesser digits.  There is no fluctuance or crepitation.  There is no drainage or pus noted today and there is no malodor.  Clinically the foot actually appears to be improved compared to when I saw him in the office.  On the left foot there is hyperpigmentation of the forefoot.       Vascular: Pedal pulses decreased  Neurological: Sensation decreased  Musculoskeletal Exam: Mild pain on exam but it seems to be more coming from his leg  from extending it during the dressing change.    ASSESSMENT/PLAN OF CARE  Osteomyelitis right foot PAD  I had a discussion with the patient's niece who was present at bedside also contacted his daughter [name] on the phone while I was in the room.  We discussed the MRI findings.  We discussed different treatment options including well-established long-term antibiotics and local wound care versus transmetatarsal amputation.  Discussed pros and cons of each.  I like to have infectious disease input as well as vascular surgery evaluation before proceeding with surgery on the right foot.  Patient's family is in agreement to this.  Currently I do think he is stable.  Mental status he is back at baseline and he is afebrile and white count is normal.  Podiatry will continue to follow.    Please contact me directly with any questions or concerns.     Celesta Gentile, DPM  [facility]  Dr. Bonna Gains. [address], [address] [place], [place] [address]                Office [phone]  Fax [phone]

## 2021-12-12 ENCOUNTER — Encounter: Payer: Medicare Other | Admitting: Podiatry

## 2021-12-12 ENCOUNTER — Other Ambulatory Visit: Payer: Self-pay | Admitting: Podiatry

## 2021-12-12 ENCOUNTER — Other Ambulatory Visit (HOSPITAL_COMMUNITY): Payer: Self-pay

## 2021-12-12 DIAGNOSIS — E11628 Type 2 diabetes mellitus with other skin complications: Secondary | ICD-10-CM | POA: Diagnosis not present

## 2021-12-12 DIAGNOSIS — G9340 Encephalopathy, unspecified: Secondary | ICD-10-CM | POA: Diagnosis not present

## 2021-12-12 DIAGNOSIS — E1151 Type 2 diabetes mellitus with diabetic peripheral angiopathy without gangrene: Secondary | ICD-10-CM

## 2021-12-12 DIAGNOSIS — M86171 Other acute osteomyelitis, right ankle and foot: Secondary | ICD-10-CM

## 2021-12-12 DIAGNOSIS — N179 Acute kidney failure, unspecified: Secondary | ICD-10-CM

## 2021-12-12 DIAGNOSIS — R4182 Altered mental status, unspecified: Secondary | ICD-10-CM | POA: Diagnosis not present

## 2021-12-12 DIAGNOSIS — E1169 Type 2 diabetes mellitus with other specified complication: Secondary | ICD-10-CM

## 2021-12-12 DIAGNOSIS — E782 Mixed hyperlipidemia: Secondary | ICD-10-CM

## 2021-12-12 DIAGNOSIS — E1142 Type 2 diabetes mellitus with diabetic polyneuropathy: Secondary | ICD-10-CM

## 2021-12-12 DIAGNOSIS — L97511 Non-pressure chronic ulcer of other part of right foot limited to breakdown of skin: Secondary | ICD-10-CM

## 2021-12-12 LAB — CBC
HCT: 31.3 % — ABNORMAL LOW (ref 39.0–52.0)
Hemoglobin: 10.4 g/dL — ABNORMAL LOW (ref 13.0–17.0)
MCH: 30 pg (ref 26.0–34.0)
MCHC: 33.2 g/dL (ref 30.0–36.0)
MCV: 90.2 fL (ref 80.0–100.0)
Platelets: 308 10*3/uL (ref 150–400)
RBC: 3.47 MIL/uL — ABNORMAL LOW (ref 4.22–5.81)
RDW: 14.6 % (ref 11.5–15.5)
WBC: 11.7 10*3/uL — ABNORMAL HIGH (ref 4.0–10.5)
nRBC: 0 % (ref 0.0–0.2)

## 2021-12-12 LAB — BASIC METABOLIC PANEL
Anion gap: 9 (ref 5–15)
BUN: <5 mg/dL — ABNORMAL LOW (ref 8–23)
CO2: 20 mmol/L — ABNORMAL LOW (ref 22–32)
Calcium: 7.5 mg/dL — ABNORMAL LOW (ref 8.9–10.3)
Chloride: 105 mmol/L (ref 98–111)
Creatinine, Ser: 0.71 mg/dL (ref 0.61–1.24)
GFR, Estimated: >60 mL/min (ref >60)
Glucose, Bld: 74 mg/dL (ref 70–99)
Potassium: 4.1 mmol/L (ref 3.5–5.1)
Sodium: 134 mmol/L — ABNORMAL LOW (ref 135–145)

## 2021-12-12 LAB — GLUCOSE, CAPILLARY
Glucose-Capillary: 108 mg/dL — ABNORMAL HIGH (ref 70–99)
Glucose-Capillary: 132 mg/dL — ABNORMAL HIGH (ref 70–99)
Glucose-Capillary: 156 mg/dL — ABNORMAL HIGH (ref 70–99)
Glucose-Capillary: 166 mg/dL — ABNORMAL HIGH (ref 70–99)
Glucose-Capillary: 219 mg/dL — ABNORMAL HIGH (ref 70–99)

## 2021-12-12 LAB — MAGNESIUM: Magnesium: 1.7 mg/dL (ref 1.7–2.4)

## 2021-12-12 MED ORDER — CEFADROXIL 500 MG PO CAPS
1000.0000 mg | ORAL_CAPSULE | Freq: Two times a day (BID) | ORAL | 0 refills | Status: AC
  Filled 2021-12-12: qty 120, 30d supply, fill #0

## 2021-12-12 MED ORDER — OXYCODONE HCL 5 MG PO TABS
5.0000 mg | ORAL_TABLET | Freq: Four times a day (QID) | ORAL | 0 refills | Status: DC | PRN
Start: 2021-12-12 — End: 2021-12-12

## 2021-12-12 MED ORDER — CEFADROXIL 500 MG PO CAPS
1000.0000 mg | ORAL_CAPSULE | Freq: Two times a day (BID) | ORAL | Status: DC
  Administered 2021-12-12 (×2): 1000 mg via ORAL
  Filled 2021-12-12 (×3): qty 2

## 2021-12-12 MED ORDER — OXYCODONE HCL 5 MG PO TABS
5.0000 mg | ORAL_TABLET | Freq: Four times a day (QID) | ORAL | 0 refills | Status: DC | PRN
Start: 2021-12-12 — End: 2022-11-09

## 2021-12-12 MED ORDER — DOXYCYCLINE HYCLATE 100 MG PO TABS
100.0000 mg | ORAL_TABLET | Freq: Two times a day (BID) | ORAL | 0 refills | Status: AC
  Filled 2021-12-12: qty 60, 30d supply, fill #0

## 2021-12-12 MED ORDER — SENNOSIDES-DOCUSATE SODIUM 8.6-50 MG PO TABS: 1.0000 | ORAL_TABLET | Freq: Every evening | ORAL | Status: DC | PRN

## 2021-12-12 NOTE — Progress Notes (Signed)
Subjective: No new complaints   Antibiotics:  Anti-infectives (From admission, onward)    Start     Dose/Rate Route Frequency Ordered Stop   12/12/21 1000  cefadroxil (DURICEF) capsule 500 mg  Status:  Discontinued        500 mg Oral 2 times daily 12/11/21 1523 12/12/21 0805   12/12/21 1000  cefadroxil (DURICEF) capsule 1,000 mg        1,000 mg Oral 2 times daily 12/12/21 0805     12/12/21 0000  cefadroxil (DURICEF) 500 MG capsule        1,000 mg Oral 2 times daily 12/12/21 1227 01/11/22 2359   12/12/21 0000  doxycycline (VIBRA-TABS) 100 MG tablet        100 mg Oral 2 times daily 12/12/21 1227 01/11/22 2359   12/10/21 0930  cefTRIAXone (ROCEPHIN) 1 g in sodium chloride 0.9 % 100 mL IVPB  Status:  Discontinued        1 g 200 mL/hr over 30 Minutes Intravenous Daily 12/10/21 0922 12/11/21 1523   12/10/21 0015  doxycycline (VIBRA-TABS) tablet 100 mg        100 mg Oral 2 times daily 12/10/21 0013         Medications: Scheduled Meds:  aspirin  81 mg Oral Daily   atorvastatin  40 mg Oral QHS   cefadroxil  1,000 mg Oral BID   clopidogrel  75 mg Oral Q breakfast   clotrimazole   Topical BID   doxycycline  100 mg Oral BID   heparin  5,000 Units Subcutaneous Q8H   insulin aspart  0-9 Units Subcutaneous Q4H   lipase/protease/amylase  24,000 Units Oral TID AC   pantoprazole  40 mg Oral Daily   Continuous Infusions:  sodium chloride 75 mL/hr at 12/12/21 0642   PRN Meds:.acetaminophen **OR** acetaminophen, guaiFENesin, hydrALAZINE, ipratropium-albuterol, LORazepam, metoprolol tartrate, ondansetron **OR** ondansetron (ZOFRAN) IV, oxyCODONE-acetaminophen, senna-docusate, traZODone    Objective: Weight change:   Intake/Output Summary (Last 24 hours) at 12/12/2021 1514 Last data filed at 12/12/2021 1235 Gross per 24 hour  Intake 1795.5 ml  Output --  Net 1795.5 ml   Blood pressure (!) 154/91, pulse 87, temperature 97.6 F (36.4 C), temperature source Oral, resp. rate  18, height '6\' 2"'$  (1.88 m), weight 68.6 kg, SpO2 100 %. Temp:  [97.6 F (36.4 C)-98.6 F (37 C)] 97.6 F (36.4 C) (09/05 1111) Pulse Rate:  [87-99] 87 (09/05 1111) Resp:  [17-19] 18 (09/05 1111) BP: (120-161)/(64-100) 154/91 (09/05 1111) SpO2:  [98 %-100 %] 100 % (09/05 1111)  Physical Exam: Physical Exam Constitutional:      Appearance: He is well-developed. He is ill-appearing.  HENT:     Head: Normocephalic and atraumatic.  Eyes:     Conjunctiva/sclera: Conjunctivae normal.  Cardiovascular:     Rate and Rhythm: Normal rate and regular rhythm.  Pulmonary:     Effort: Pulmonary effort is normal. No respiratory distress.     Breath sounds: Normal breath sounds. No stridor. No wheezing.  Abdominal:     General: There is no distension.     Palpations: Abdomen is soft.  Musculoskeletal:        General: Normal range of motion.     Cervical back: Normal range of motion and neck supple.  Neurological:     General: No focal deficit present.     Mental Status: He is alert.  Psychiatric:        Mood and Affect: Mood  normal.        Behavior: Behavior normal.        Thought Content: Thought content normal.        Judgment: Judgment normal.     Patient confused CBC:    BMET Recent Labs    12/11/21 0312 12/12/21 0253  NA 135 134*  K 4.5 4.1  CL 100 105  CO2 27 20*  GLUCOSE 116* 74  BUN 19 <5*  CREATININE 1.61* 0.71  CALCIUM 9.2 7.5*     Liver Panel  Recent Labs    12/09/21 2147  PROT 8.8*  ALBUMIN 2.8*  AST 20  ALT 14  ALKPHOS 85  BILITOT 0.7       Sedimentation Rate Recent Labs    12/10/21 0710  ESRSEDRATE 96*   C-Reactive Protein Recent Labs    12/10/21 1543  CRP 1.2*    Micro Results: Recent Results (from the past 720 hour(s))  Blood culture (routine x 2)     Status: None   Collection Time: 11/15/21  6:52 PM   Specimen: BLOOD  Result Value Ref Range Status   Specimen Description BLOOD SITE NOT SPECIFIED  Final   Special Requests    Final    BOTTLES DRAWN AEROBIC ONLY Blood Culture adequate volume   Culture   Final    NO GROWTH 5 DAYS Performed at [facility], [address], [place], [place] [address]    Report Status 11/20/2021 FINAL  Final  Blood culture (routine x 2)     Status: None   Collection Time: 11/15/21  9:51 PM   Specimen: BLOOD RIGHT HAND  Result Value Ref Range Status   Specimen Description BLOOD RIGHT HAND  Final   Special Requests   Final    BOTTLES DRAWN AEROBIC AND ANAEROBIC Blood Culture adequate volume   Culture   Final    NO GROWTH 5 DAYS Performed at [facility], [place] [address], [place], [place] [address]    Report Status 11/20/2021 FINAL  Final  MRSA Next Gen by PCR, Nasal     Status: Abnormal   Collection Time: 11/16/21  6:50 AM   Specimen: Nasal Mucosa; Nasal Swab  Result Value Ref Range Status   MRSA by PCR Next Gen DETECTED (A) NOT DETECTED Final    Comment: RESULT CALLED TO, READ BACK BY AND VERIFIED WITH:  11/16/21 8:43  RN CAMRYN COGAN (NOTE) The GeneXpert MRSA Assay (FDA approved for NASAL specimens only), is one component of a comprehensive MRSA colonization surveillance program. It is not intended to diagnose MRSA infection nor to guide or monitor treatment for MRSA infections. Test performance is not FDA approved in patients less than 54 years old. Performed at [facility], [place] [address] [place], [place] [address]   Aerobic/Anaerobic Culture w Gram Stain (surgical/deep wound)     Status: None   Collection Time: 11/26/21 11:01 AM   Specimen: Abscess  Result Value Ref Range Status   Specimen Description ABSCESS  Final   Special Requests NONE  Final   Gram Stain   Final    NO WBC SEEN WBC PRESENT, PREDOMINANTLY MONONUCLEAR RARE GRAM POSITIVE COCCI IN PAIRS    Culture   Final    FEW METHICILLIN RESISTANT STAPHYLOCOCCUS AUREUS NO ANAEROBES ISOLATED Performed at [facility], [address] [place], [place] [address]    Report Status  12/01/2021 FINAL  Final   Organism ID, Bacteria METHICILLIN RESISTANT STAPHYLOCOCCUS AUREUS  Final      Susceptibility   Methicillin resistant staphylococcus aureus - MIC*    CIPROFLOXACIN >=8 RESISTANT Resistant     ERYTHROMYCIN >=8 RESISTANT Resistant     GENTAMICIN <=0.5 SENSITIVE Sensitive     OXACILLIN >=4 RESISTANT Resistant     TETRACYCLINE <=1 SENSITIVE Sensitive     VANCOMYCIN 1 SENSITIVE Sensitive     TRIMETH/SULFA <=10 SENSITIVE Sensitive     CLINDAMYCIN <=0.25 SENSITIVE Sensitive     RIFAMPIN <=0.5 SENSITIVE Sensitive     Inducible Clindamycin NEGATIVE Sensitive     * FEW METHICILLIN RESISTANT STAPHYLOCOCCUS AUREUS  Urine Culture     Status: None   Collection Time: 12/10/21  2:39 PM   Specimen: Urine, Clean Catch  Result Value Ref Range Status   Specimen Description URINE, CLEAN CATCH  Final   Special Requests NONE  Final   Culture   Final    NO GROWTH Performed at [facility], [place] [address], [place], [place] [address]    Report Status 12/11/2021 FINAL  Final    Studies/Results: No results found.    Assessment/Plan:  INTERVAL HISTORY:   Patient apparently being DC to home   Principal Problem:   Acute encephalopathy Active Problems:   Type 2 diabetes mellitus with stage 3b chronic kidney disease, with long-term current use of insulin ([name])   Chronic kidney disease, stage 3b ([name])   Mixed diabetic hyperlipidemia associated with type 2 diabetes mellitus ([name])   Polyneuropathy due to type 2 diabetes mellitus ([place])   Diabetic foot infection ([place])   PAD (peripheral artery disease) ([place])   Balanitis   Altered mental status    Justin [name] is a 87 y.o. 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 Gale Journey  Concern that  patient would not be able to heal further amputations in feet that would not involve BKA  I spent 36  minutes with the patient including than 50% of the time in face to face counseling of the patient and his daughter re his possible osteomyelitis, PVD, DM,  personally reviewing MRI along with review of medical records in preparation for the visit and during the visit and in coordination of his care.    LOS: 2 days   Alcide Evener 12/12/2021, 3:14 PM

## 2021-12-12 NOTE — Evaluation (Addendum)
Occupational Therapy Evaluation Patient Details Name: Justin [name] MRN: [id number] DOB: January 01, 1935 Today's Date: 12/12/2021   History of Present Illness Pt is an 87 y/o male admitted from SNF secondary to [place]. Likely secondary to  UTI. Recent R 2nd toe amputation. PMH includes dementia, CVA, CAD, DM, HTN, and CKD.   Clinical Impression   Pt dependent at baseline with ADLs and mobility, per family (present in room), pt using w/c at baseline, staff assisting with transfers and all ADLs including feeding. Pt needing mod-total A for ADLs, mod-max A for bed mobility, and total A for lateral scoot transfer attempts. Pt with impaired cognition, unable to state birth year or age correctly, difficulty sequencing lateral scoot transfer despite given demonstration. Pt presenting with impairments listed below, will follow acutely. Recommend SNF at d/c.      Recommendations for follow up therapy are one component of a multi-disciplinary discharge planning process, led by the attending physician.  Recommendations may be updated based on patient status, additional functional criteria and insurance authorization.   Follow Up Recommendations  Skilled nursing-short term rehab (<3 hours/day)    Assistance Recommended at Discharge Frequent or constant Supervision/Assistance  Patient can return home with the following Two people to help with walking and/or transfers;A lot of help with bathing/dressing/bathroom;Assistance with feeding;Direct supervision/assist for medications management;Direct supervision/assist for financial management;Assist for transportation;Help with stairs or ramp for entrance    Functional Status Assessment  Patient has had a recent decline in their functional status and demonstrates the ability to make significant improvements in function in a reasonable and predictable amount of time.  Equipment Recommendations  None recommended by OT    Recommendations for Other Services PT  consult     Precautions / Restrictions Precautions Precautions: Fall Precaution Comments: bladder/bowel incontinence Required Braces or Orthoses: Other Brace Other Brace: darco shoe; per previous notes Restrictions Weight Bearing Restrictions: Yes RUE Weight Bearing: Weight bearing as tolerated RLE Weight Bearing: Weight bearing as tolerated Other Position/Activity Restrictions: Per previous notes, pt WBAT in post op shoe      Mobility Bed Mobility Overal bed mobility: Needs Assistance Bed Mobility: Supine to Sit, Sit to Supine, Rolling     Supine to sit: Mod assist Sit to supine: Max assist   General bed mobility comments: assist for bringing BLE's into bed    Transfers Overall transfer level: Needs assistance Equipment used: None Transfers: Bed to chair/wheelchair/BSC            Lateral/Scoot Transfers: Total assist General transfer comment: simulated at EOB to scoot toward [facility]      Balance Overall balance assessment: Needs assistance Sitting-balance support: No upper extremity supported Sitting balance-Leahy Scale: Fair Sitting balance - Comments: cannot reach outside BOS without LOB                                   ADL either performed or assessed with clinical judgement   ADL Overall ADL's : Needs assistance/impaired Eating/Feeding: Moderate assistance   Grooming: Moderate assistance   Upper Body Bathing: Maximal assistance   Lower Body Bathing: Maximal assistance   Upper Body Dressing : Maximal assistance   Lower Body Dressing: Maximal assistance   Toilet Transfer: Maximal assistance   Toileting- Clothing Manipulation and Hygiene: Total assistance Toileting - Clothing Manipulation Details (indicate cue type and reason): incontinent     Functional mobility during ADLs: Maximal assistance       Vision  Vision Assessment?: No apparent visual deficits     Perception     Praxis      Pertinent Vitals/Pain Pain  Assessment Pain Assessment: Faces Pain Score: 4  Pain Location: BLE Pain Descriptors / Indicators: Grimacing, Guarding Pain Intervention(s): Limited activity within patient's tolerance, Monitored during session, Repositioned     Hand Dominance Right   Extremity/Trunk Assessment Upper Extremity Assessment Upper Extremity Assessment: RUE deficits/detail RUE Deficits / Details: R weakness from prior CVA per fmaily report, 3/5 ROM RUE Coordination: decreased fine motor   Lower Extremity Assessment Lower Extremity Assessment: Defer to PT evaluation RLE Deficits / Details: Residual R weakness at baseline LLE Deficits / Details: s/p recent L 2nd toe amputation   Cervical / Trunk Assessment Cervical / Trunk Assessment: Kyphotic   Communication Communication Communication: Expressive difficulties   Cognition Arousal/Alertness: Awake/alert Behavior During Therapy: Restless                                   General Comments: Dementia at baseline. Poor memory noted. Disoriented to place and time. Unable to state his age or birth year appropriately     General Comments  HR 101 with bed mobility, family at bedside    Exercises     Shoulder [place] expects to be discharged to:: Skilled nursing facility                                 Additional Comments: pt LTC resident at [facility]      Prior Functioning/Environment Prior Level of Function : Needs assist;Patient poor historian/Family not available             Mobility Comments: family reports pt mobilizes at w/c level, staff assists with transfers ADLs Comments: pt dependent for ADLs, staff assists with feeding        OT Problem List: Decreased strength;Decreased activity tolerance;Impaired balance (sitting and/or standing);Decreased cognition;Decreased safety awareness;Decreased knowledge of use of DME or AE;Decreased knowledge of  precautions;Pain;Decreased range of motion;Impaired UE functional use;Decreased coordination      OT Treatment/Interventions: Self-care/ADL training;Therapeutic exercise;Energy conservation;DME and/or AE instruction;Therapeutic activities;Patient/family education;Balance training    OT Goals(Current goals can be found in the care plan section) Acute Rehab OT Goals Patient Stated Goal: none stated OT Goal Formulation: Patient unable to participate in goal setting Time For Goal Achievement: 12/26/21 Potential to Achieve Goals: Good ADL Goals Pt Will Perform Upper Body Dressing: with mod assist;sitting Pt Will Perform Lower Body Dressing: with mod assist;sit to/from stand;sitting/lateral leans;bed level Pt Will Transfer to Toilet: with mod assist;bedside commode;stand pivot transfer;squat pivot transfer Additional ADL Goal #1: pt will complete bed mobility min A in prep for ADLs  OT Frequency: Min 2X/week    Co-evaluation              AM-PAC OT "6 Clicks" Daily Activity     Outcome Measure Help from another person eating meals?: A Lot Help from another person taking care of personal grooming?: A Lot Help from another person toileting, which includes using toliet, bedpan, or urinal?: Total Help from another person bathing (including washing, rinsing, drying)?: A Lot Help from another person to put on and taking off regular upper body clothing?: A Lot Help from another person to put on and taking off regular lower body clothing?: A Lot 6  Click Score: 11   End of Session Nurse Communication: Mobility status  Activity Tolerance: Patient tolerated treatment well Patient left: in bed;with call bell/phone within reach;with bed alarm set;with family/visitor present  OT Visit Diagnosis: Other abnormalities of gait and mobility (R26.89);Unsteadiness on feet (R26.81)                Time: 7517-0017 OT Time Calculation (min): 21 min Charges:  OT General Charges $OT Visit: 1 Visit OT  Evaluation $OT Eval Moderate Complexity: [address], OTD, OTR/L Acute Rehab [phone]) 832 - [place] 12/12/2021, 2:11 PM

## 2021-12-12 NOTE — Progress Notes (Signed)
Called to give nursing report no answer

## 2021-12-12 NOTE — Evaluation (Signed)
Physical Therapy Evaluation Patient Details Name: Justin [name] MRN: [id number] DOB: [birth year] Today's Date: 12/12/2021  History of Present Illness  Pt is an 87 y/o male admitted from SNF secondary to [place]. Likely secondary to  UTI. Recent R 2nd toe amputation. PMH includes dementia, CVA, CAD, DM, HTN, and CKD.  Clinical Impression  Pt admitted secondary to problem above with deficits below. Pt requiring mod A for bed mobility tasks. Increased confusion noted and repetitive throughout. Per notes, pt from SNF and required assist for mobility tasks. Recommend return at d/c. Will continue to follow acutely.        Recommendations for follow up therapy are one component of a multi-disciplinary discharge planning process, led by the attending physician.  Recommendations may be updated based on patient status, additional functional criteria and insurance authorization.  Follow Up Recommendations Skilled nursing-short term rehab (<3 hours/day) Can patient physically be transported by private vehicle: No    Assistance Recommended at Discharge Frequent or constant Supervision/Assistance  Patient can return home with the following  A lot of help with walking and/or transfers;A lot of help with bathing/dressing/bathroom    Equipment Recommendations None recommended by PT  Recommendations for Other Services       Functional Status Assessment Patient has had a recent decline in their functional status and demonstrates the ability to make significant improvements in function in a reasonable and predictable amount of time.     Precautions / Restrictions Precautions Precautions: Fall Precaution Comments: bladder/bowel incontinence Required Braces or Orthoses: Other Brace Other Brace: darco shoe; per previous notes Restrictions Weight Bearing Restrictions: Yes RUE Weight Bearing: Weight bearing as tolerated RLE Weight Bearing: Weight bearing as tolerated Other Position/Activity Restrictions:  Per previous notes, pt WBAT in post op shoe      Mobility  Bed Mobility Overal bed mobility: Needs Assistance Bed Mobility: Supine to Sit, Sit to Supine, Rolling Rolling: Mod assist   Supine to sit: Mod assist Sit to supine: Mod assist   General bed mobility comments: Assist for LE and trunk assist. Pt requiring assist for rolling to change soiled sheets    Transfers                        Ambulation/Gait                  Stairs            Wheelchair Mobility    Modified Rankin (Stroke Patients Only)       Balance Overall balance assessment: Needs assistance Sitting-balance support: No upper extremity supported Sitting balance-Leahy Scale: Fair                                       Pertinent Vitals/Pain Pain Assessment Pain Assessment: Faces Faces Pain Scale: Hurts little more Pain Location: BLE Pain Descriptors / Indicators: Grimacing, Guarding Pain Intervention(s): Limited activity within patient's tolerance, Monitored during session, Repositioned    Home Living Family/patient expects to be discharged to:: Skilled nursing facility                   Additional Comments: pt LTC resident at [facility]    Prior Function Prior Level of Function : Needs assist;Patient poor historian/Family not available             Mobility Comments: From PT Eval note 05/2021, daughter reports pt  has not ambulated in ~2 yrs but unsure if pt transfering without assist, uses w/c, was dressing himself; unsure of mobility since stroke, no family present on eval today       Hand Dominance        Extremity/Trunk Assessment   Upper Extremity Assessment Upper Extremity Assessment: Defer to OT evaluation    Lower Extremity Assessment Lower Extremity Assessment: RLE deficits/detail;LLE deficits/detail;Generalized weakness RLE Deficits / Details: Residual R weakness at baseline LLE Deficits / Details: s/p recent L 2nd toe  amputation    Cervical / Trunk Assessment Cervical / Trunk Assessment: Kyphotic  Communication   Communication: Expressive difficulties;HOH  Cognition Arousal/Alertness: Awake/alert Behavior During Therapy: Restless Overall Cognitive Status: No family/caregiver present to determine baseline cognitive functioning                                 General Comments: Dementia at baseline. Poor memory noted. Disoriented to place and time.        General Comments General comments (skin integrity, edema, etc.): No family present    Exercises     Assessment/Plan    PT Assessment Patient needs continued PT services  PT Problem List Decreased strength;Decreased range of motion;Decreased activity tolerance;Decreased balance;Decreased mobility;Decreased cognition;Decreased knowledge of use of DME;Decreased safety awareness;Decreased knowledge of precautions;Impaired sensation;Impaired tone;Pain       PT Treatment Interventions DME instruction;Functional mobility training;Therapeutic activities;Therapeutic exercise;Balance training;Patient/family education;Cognitive remediation;Wheelchair mobility training    PT Goals (Current goals can be found in the Care Plan section)  Acute Rehab PT Goals PT Goal Formulation: Patient unable to participate in goal setting Time For Goal Achievement: 12/26/21 Potential to Achieve Goals: Fair    Frequency Min 2X/week     Co-evaluation               AM-PAC PT "6 Clicks" Mobility  Outcome Measure Help needed turning from your back to your side while in a flat bed without using bedrails?: A Lot Help needed moving from lying on your back to sitting on the side of a flat bed without using bedrails?: A Lot Help needed moving to and from a bed to a chair (including a wheelchair)?: Total Help needed standing up from a chair using your arms (e.g., wheelchair or bedside chair)?: Total Help needed to walk in hospital room?: Total Help  needed climbing 3-5 steps with a railing? : Total 6 Click Score: 8    End of Session Equipment Utilized During Treatment: Gait belt Activity Tolerance: Patient tolerated treatment well;Patient limited by pain Patient left: in bed;with call bell/phone within reach;with bed alarm set;Other (comment) (with urinal placed; RN aware) Nurse Communication: Mobility status;Other (comment) (pt had urinal placed) PT Visit Diagnosis: Other abnormalities of gait and mobility (R26.89);Muscle weakness (generalized) (M62.81);Pain Pain - Right/Left:  (bilateral) Pain - part of body: Leg    Time: 9509-3267 PT Time Calculation (min) (ACUTE ONLY): 19 min   Charges:   PT Evaluation $PT Eval Moderate Complexity: 1 Mod          Reuel Derby, PT, DPT  Acute Rehabilitation Services  Office: [phone]   Rudean Hitt 12/12/2021, 1:43 PM

## 2021-12-12 NOTE — TOC Transition Note (Signed)
Transition of Care [facility]) - CM/SW Discharge Note   Patient Details  Name: Justin [name] MRN: [id number] Date of Birth: 1935/02/28  [facility] CM/SW Contact:  Milinda Antis, [place] Phone Number: 12/12/2021, 3:18 PM   Clinical Narrative:    Patient will DC to: [facility] date: 12/12/2021 Family notified:  yes, by RN Transport by: Corey Harold   Per MD patient ready for DC to SNF . RN to call report prior to discharge [phone] room 903A. RN, patient's family, and facility notified of DC. Discharge Summary sent to facility. DC packet on chart. Ambulance transport requested for patient.   CSW will sign off for now as social work intervention is no longer needed. Please consult [place] again if new needs arise.     Final next level of care: Skilled Nursing Facility Barriers to Discharge: No Barriers Identified   Patient Goals and CMS Choice        Discharge Placement              Patient chooses bed at:  [facility]) Patient to be transferred to facility by: [place] Name of family member notified: daughter Patient and family notified of of transfer: 12/12/21  Discharge Plan and Services                                     Social Determinants of Health (SDOH) Interventions     Readmission Risk Interventions     No data to display

## 2021-12-12 NOTE — Discharge Summary (Signed)
Physician Discharge Summary  Justin [name] NUU:[id number] DOB: [birth year] DOA: 12/09/2021  PCP: [facility]  Admit date: 12/09/2021 Discharge date: 12/12/2021  Admitted From: [facility] Disposition:  [place]   Recommendations for Outpatient Follow-up:  Follow up with PCP in 1-2 weeks Please obtain BMP/CBC in one week your next doctors visit.  4 weeks of Doxycyline + Duricef per ID and Podiatry therefore reconsider future options.  Gabapentin held for now, resume at a lower dose if needed.  Follow up outpatient Podiatry and Vascular Surgery Dressing instructions -keep it clean and dry with level of Betadine.  Dressing changes daily. Podiatry to make outpatient wound care referral.   Discharge Condition: Stable CODE STATUS: DNR Diet recommendation: Diabetic.   Brief/Interim Summary: 87 year old with history of DM2, HTN, PAD, CVA, CKD stage IIIb admitted to the hospital on 8/10 for osteomyelitis and gangrene of the second left toe.  He underwent toe amputation, had angioplasty and stenting of his right lower extremity.  Hospital course also complicated by encephalopathy likely suspected due to cefepime and worsening creatinine eventually switched vancomycin to linezolid.  He was discharged to SNF where he started experiencing confusion therefore brought him back to the hospital.  Currently he has been on outpatient doxycycline.  MRI was consistent with osteomyelitis therefore infectious disease and podiatry were consulted. Eventually decided conservative management with PO Doxycyline and Duricef by ID and Podiatry.  Follow up outpatient.      Assessment & Plan:  Principal Problem:   Acute encephalopathy Active Problems:   Diabetic foot infection ([name])   Balanitis   PAD (peripheral artery disease) ([name])   Chronic kidney disease, stage 3b ([name])   Type 2 diabetes mellitus with stage 3b chronic kidney disease, with long-term current use of insulin ([name])   Mixed diabetic  hyperlipidemia associated with type 2 diabetes mellitus ([name])   Polyneuropathy due to type 2 diabetes mellitus ([place])       Assessment and Plan: * Acute encephalopathy Urinary tract infection Wonder if this was delirium on underlying infection related.  This morning his mentation is back to baseline.  CT of the head is negative. UA-showing UTI, going home on PO Duricef for Osteomyelitis.  TSH, B12 and folate-normal   Diabetic foot infection ([place]), right foot Recently underwent amputation of the left toe.   Recently underwent PCI as well to his right foot.  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 MRI of foot consistent with right foot osteomyelitis Podiatry and ID consulted - avoid surgical intervention at this time, as he is not a good candidate. Planning for 4 weeks of Doxycyline and Duricef.  Dressing instructions as mentioned above.   Balanitis Clotrimazole twice daily   PAD (peripheral artery disease) ([name]) Previous history of CVA Recent PCI.  Does have palpable pulses.  Aspirin, Plavix and statin   Chronic kidney disease, stage 3b ([name]) Baseline Cr1.8.  Admission creatinine 2.13, resolved. Cr today 0.7   Type 2 diabetes mellitus with stage 3b chronic kidney disease, with long-term current use of insulin ([name]) Resume home regimen.  A1c 8.9.     Mixed diabetic hyperlipidemia associated with type 2 diabetes mellitus ([name]) Statin   Polyneuropathy due to type 2 diabetes mellitus ([name]) Hold gabapentin on dc. Can be resumed in the future if needed.       Body mass index is 19.42 kg/m.  Pressure Injury 11/24/21 Heel Right Stage 2 -  Partial thickness loss of dermis presenting as a shallow open injury with a  red, pink wound bed without slough. (Active)  11/24/21 2200  Location: Heel  Location Orientation: Right  Staging: Stage 2 -  Partial thickness loss of dermis presenting as a shallow open injury with a red, pink wound bed without slough.  Wound Description  (Comments):   Present on Admission:       Discharge Diagnoses:  Principal Problem:   Acute encephalopathy Active Problems:   Diabetic foot infection ([place])   Balanitis   PAD (peripheral artery disease) ([name])   Chronic kidney disease, stage 3b ([name])   Type 2 diabetes mellitus with stage 3b chronic kidney disease, with long-term current use of insulin ([name])   Mixed diabetic hyperlipidemia associated with type 2 diabetes mellitus ([employer])   Polyneuropathy due to type 2 diabetes mellitus ([place])   Altered mental status      Consultations: Podiatry ID  Subjective: No complaints. Feels ok.  Daughter updated by me today.   Discharge Exam: Vitals:   12/12/21 0550 12/12/21 1111  BP: (!) 161/82 (!) 154/91  Pulse: 91 87  Resp: 17 18  Temp: 98.3 F (36.8 C) 97.6 F (36.4 C)  SpO2: 99% 100%   Vitals:   12/11/21 1713 12/11/21 2147 12/12/21 0550 12/12/21 1111  BP: 124/64 (!) 120/100 (!) 161/82 (!) 154/91  Pulse: 99 95 91 87  Resp: '18 19 17 18  ' Temp: 98 F (36.7 C) 98.6 F (37 C) 98.3 F (36.8 C) 97.6 F (36.4 C)  TempSrc:    Oral  SpO2: 99% 98% 99% 100%  Weight:      Height:        General: Pt is alert, awake, not in acute distress Cardiovascular: RRR, S1/S2 +, no rubs, no gallops Respiratory: CTA bilaterally, no wheezing, no rhonchi Abdominal: Soft, NT, ND, bowel sounds + Extremities: no edema, no cyanosis. Poor LE hygiene and amputation.   Discharge Instructions   Allergies as of 12/12/2021   No Known Allergies      Medication List     STOP taking these medications    ascorbic acid 500 MG tablet Commonly known as: VITAMIN C   feeding supplement (PRO-STAT SUGAR FREE 64) Liqd   fluticasone 50 MCG/ACT nasal spray Commonly known as: FLONASE   gabapentin 400 MG capsule Commonly known as: NEURONTIN   Glucerna Liqd   magnesium oxide 400 (240 Mg) MG tablet Commonly known as: MAG-OX   oxyCODONE-acetaminophen 5-325 MG tablet Commonly known as:  PERCOCET/ROXICET       TAKE these medications    Accu-Chek Aviva Plus w/Device Kit Use to check blood sugar 3 times daily. Dx: E10.9   Accu-Chek Aviva Soln Use to check blood sugar 3 times daily. Dx: E10.9   accu-chek soft touch lancets Use to check blood sugar 3 times daily. Dx: E10.9   acetaminophen 325 MG tablet Commonly known as: TYLENOL Take 650 mg by mouth daily as needed (pain).   Alcohol Swabs Pads Use to check blood sugar 3 times daily. Dx: E10.9   Aquaphor Adv Protect Healing 41 % Oint Apply 1 application  topically daily. Mix with Lidocaine cream . Apply to feet/toes   aspirin 81 MG chewable tablet Chew 81 mg by mouth daily.   atorvastatin 40 MG tablet Commonly known as: LIPITOR Take 40 mg by mouth at bedtime.   cefadroxil 500 MG capsule Commonly known as: DURICEF Take 2 capsules (1,000 mg total) by mouth 2 (two) times daily.   clopidogrel 75 MG tablet Commonly known as: PLAVIX Take 1 tablet (75  mg total) by mouth daily with breakfast.   Creon 24000-76000 units Cpep Generic drug: Pancrelipase (Lip-Prot-Amyl) Take 1 capsule by mouth in the morning, at noon, and at bedtime. What changed: Another medication with the same name was removed. Continue taking this medication, and follow the directions you see here.   doxycycline 100 MG tablet Commonly known as: VIBRA-TABS Take 1 tablet (100 mg total) by mouth 2 (two) times daily. What changed: additional instructions   ferrous gluconate 324 MG tablet Commonly known as: FERGON Take 324 mg by mouth See admin instructions. Take 324 mg by mouth on Monday, Tuesday, Wednesday, Thursday, Friday   fluticasone furoate-vilanterol 100-25 MCG/ACT Aepb Commonly known as: BREO ELLIPTA Inhale 1 puff into the lungs daily.   glucagon 1 MG injection Inject 1 mg into the muscle once as needed. What changed: reasons to take this   glucose blood test strip Commonly known as: Accu-Chek Aviva Plus Use to check blood sugar  3 times daily. Dx: E10.9   insulin glargine-yfgn 100 UNIT/ML injection Commonly known as: SEMGLEE Inject 0.1 mLs (10 Units total) into the skin daily. What changed: how much to take   Insulin Pen Needle 32G X 4 MM Misc Use as directed to inject insulin tidwc   Ipratropium-Albuterol 20-100 MCG/ACT Aers respimat Commonly known as: COMBIVENT Inhale 1 puff into the lungs every 6 (six) hours as needed for wheezing. What changed: reasons to take this   isosorbide mononitrate 10 MG tablet Commonly known as: ISMO Take 10 mg by mouth at bedtime.   lidocaine 4 % cream Commonly known as: LMX Apply 1 application  topically daily. Apply generously to right lower leg and heels daily-pain.   loratadine 10 MG tablet Commonly known as: CLARITIN Take 10 mg by mouth daily.   MINERIN CREME EX Apply 1 application  topically daily. Apply minerin cream to bilateral feet daily. R/t dry/cracked skin   NovoLOG FlexPen 100 UNIT/ML FlexPen Generic drug: insulin aspart Inject 0-8 mLs into the skin in the morning and at bedtime. Per sliding scale: if is BS 70-200 give 0 units, if BS is 201-250 give 2 units, if BS is 251-300 give 4 units, if BS is 301-350 give 6 units, if BS is 351-400 give 8 units. If blood sugar is greater than 400 call MD.   omeprazole 20 MG capsule Commonly known as: PRILOSEC Take 20 mg by mouth every other day.   oxyCODONE 5 MG immediate release tablet Commonly known as: Oxy IR/ROXICODONE Take 1 tablet (5 mg total) by mouth every 6 (six) hours as needed for severe pain or breakthrough pain. What changed:  when to take this reasons to take this   [place] Use as directed with combivent. Ok to dispense any spacer that works with the inhaler being rxed to pt.   Trulicity 1.5 LK/4.4WN Sopn Generic drug: Dulaglutide Inject 1.5 mg into the skin once a week. Fridays        [place] Follow up in 1 week(s).    Contact information: [place] [id number] [phone]                No Known Allergies  You were cared for by a hospitalist during your hospital stay. If you have any questions about your discharge medications or the care you received while you were in the hospital after you are discharged, you can call the unit and asked to speak with the hospitalist  on call if the hospitalist that took care of you is not available. Once you are discharged, your primary care physician will handle any further medical issues. Please note that no refills for any discharge medications will be authorized once you are discharged, as it is imperative that you return to your primary care physician (or establish a relationship with a primary care physician if you do not have one) for your aftercare needs so that they can reassess your need for medications and monitor your lab values.   Procedures/Studies: MR FOOT RIGHT WO CONTRAST  Result Date: 12/10/2021 CLINICAL DATA:  Foot swelling, nondiabetic, osteomyelitis suspected EXAM: MRI OF THE RIGHT FOREFOOT WITHOUT CONTRAST TECHNIQUE: Multiplanar, multisequence MR imaging of the right forefoot was performed. No intravenous contrast was administered. COMPARISON:  CT 12/10/2021 FINDINGS: Bones/Joint/Cartilage Posterior changes of second toe amputation. There is marrow edema and mildly lower T1 signal in the second and third metatarsal heads with cortical irregularity. There is marrow edema in the third toe proximal phalanx Preserved T1 marrow signal within the great toe phalanges and first metatarsal, with minimal dorsal sided subcortical edema signal in the periarticular bone at the great toe MTP joint and trace MTP joint fluid. There is minimal marrow edema in the great toe lateral sesamoid with preserved T1 marrow signal. Chronic erosive change of the distal tuft. Ligaments Intact Lisfranc ligament. Muscles and Tendons Diffuse intramuscular edema and  atrophy in the foot as is commonly seen in diabetics. Soft tissues Soft tissue changes in the medial forefoot related to recent second toe amputation. There is diffuse soft tissue swelling of the foot. IMPRESSION: Postsurgical changes of second toe amputation. Marrow signal abnormality and cortical irregularity at the second and third metatarsal heads and third toe proximal phalanx consistent with early osteomyelitis. Soft tissue swelling without evidence of abscess. Minimal marrow signal abnormality dorsal aspect of the great toe metatarsal head at the MTP joint with preserved T1 marrow signal and trace first MTP joint fluid, may suggest subacute osteomyelitis given cortical regularity on recent CT. Mild marrow edema in the adjacent first MTP lateral sesamoid, potentially early osteomyelitis or reactive change. Electronically Signed   By: Maurine Simmering M.D.   On: 12/10/2021 11:41   CT Foot Right Wo Contrast  Result Date: 12/10/2021 CLINICAL DATA:  Foot swelling, diabetic, osteomyelitis suspected, xray done EXAM: CT OF THE RIGHT FOOT WITHOUT CONTRAST TECHNIQUE: Multidetector CT imaging of the right foot was performed according to the standard protocol. Multiplanar CT image reconstructions were also generated. RADIATION DOSE REDUCTION: This exam was performed according to the departmental dose-optimization program which includes automated exposure control, adjustment of the mA and/or kV according to patient size and/or use of iterative reconstruction technique. COMPARISON:  None Available. FINDINGS: Bones/Joint/Cartilage Amputation of the second digit distal to the metatarsophalangeal joint. The third, fourth, and fifth digits are held in fixed dorsiflexion. Hammertoe deformity of the great toe. There are erosive changes involving the dorsal, periarticular cortices of the first metatarsal head and base of the first proximal phalanx in keeping with changes of osteomyelitis/septic arthritis, best seen on image # 64/8.  Erosive changes involving the distal tuft of the great appear chronic in nature. There is a shallow ulcer involving the distal aspect of the great toe best seen on sagittal image # 66/10. Erosive changes are seen within the second metatarsal head and undersurface of the third metatarsal head suspicious for changes of focal osteomyelitis in these locations. There is extensive surrounding soft tissue swelling, particularly with involving  the plantar soft tissues. The osseous structures are diffusely osteopenic.  No acute fracture. Ligaments Suboptimally assessed by CT. Muscles and Tendons The intrinsic musculature of the foot is markedly atrophic. Flexor, extensor, and peroneal tendons appear intact. Soft tissues Advanced vascular calcifications are seen within the left foot. No subcutaneous fluid collection is identified. IMPRESSION: 1. Findings in keeping with osteomyelitis/septic arthritis involving the first metatarsal head and base of the first proximal phalanx. 2. Erosive changes involving the second metatarsal head and undersurface of the third metatarsal head suspicious for changes of focal osteomyelitis in these locations. 3. Shallow ulcer involving the distal aspect of the great toe with extensive surrounding soft tissue swelling, particularly involving the plantar soft tissues. 4. Diffuse osteopenia. 5. Advanced vascular calcifications. 6. Marked atrophy of the intrinsic musculature of the foot. Electronically Signed   By: Fidela Salisbury M.D.   On: 12/10/2021 03:42   CT HEAD WO CONTRAST  Result Date: 12/09/2021 CLINICAL DATA:  Altered mental status EXAM: CT HEAD WITHOUT CONTRAST TECHNIQUE: Contiguous axial images were obtained from the base of the skull through the vertex without intravenous contrast. RADIATION DOSE REDUCTION: This exam was performed according to the departmental dose-optimization program which includes automated exposure control, adjustment of the mA and/or kV according to patient size  and/or use of iterative reconstruction technique. COMPARISON:  11/19/2021 FINDINGS: Brain: No acute intracranial findings are seen. There are no signs of bleeding within the cranium. There is marked prominence of cortical sulci. There is decreased density in periventricular and subcortical white matter. No focal mass effect is seen. Vascular: There are scattered arterial calcifications. Skull: No fracture is seen in calvarium. Sinuses/Orbits: There is mucosal thickening in frontal, ethmoid and sphenoid sinuses. Other: None. IMPRESSION: No acute intracranial findings are seen. Atrophy. Small vessel disease. Arteriosclerosis.  Chronic sinusitis. Electronically Signed   By: Elmer Picker M.D.   On: 12/09/2021 19:55   DG Foot 2 Views Right  Result Date: 12/09/2021 CLINICAL DATA:  Evaluate for osteomyelitis. EXAM: RIGHT FOOT - 2 VIEW COMPARISON:  Right foot radiograph dated 12/01/2021. FINDINGS: Status post amputation of the second phalanges. There is no acute fracture or dislocation. The bones are osteopenic. Erosive changes of the tuft of the distal phalanx of the great toe as well as erosive changes of the ankle, likely chronic. There is diffuse vascular calcification. Subcutaneous edema and soft tissue swelling of the dorsum of the forefoot. No radiopaque foreign object or subcu gas. IMPRESSION: 1. Status post amputation of the second phalanges. No acute fracture or dislocation. 2. Erosive changes of the tuft of the distal phalanx of the great toe and ankle, likely chronic. MRI or a white blood cell nuclear scan may provide better evaluation if there is clinical concern for acute osteomyelitis. Electronically Signed   By: Anner Crete M.D.   On: 12/09/2021 19:37   DG Foot Complete Right  Result Date: 12/05/2021 Please see detailed radiograph report in office note.  DG Foot 2 Views Right  Result Date: 11/26/2021 CLINICAL DATA:  Status post amputation EXAM: RIGHT FOOT - 2 VIEW COMPARISON:   11/15/2021 FINDINGS: Status post right second digital amputation with expected overlying postoperative change. Redemonstrated sclerotic, chronically eroded appearance of the tip of the right great toe. No fracture or dislocation. Extensive vascular calcinosis. IMPRESSION: Status post right second digital amputation with expected overlying postoperative change. Electronically Signed   By: Delanna Ahmadi M.D.   On: 11/26/2021 12:31   PERIPHERAL VASCULAR CATHETERIZATION  Result Date: 11/24/2021 DATE OF  SERVICE: 11/24/2021  PATIENT:  [name]  87 y.o. male  PRE-OPERATIVE DIAGNOSIS:  Atherosclerosis of native arteries of right lower extremity causing gangrene  POST-OPERATIVE DIAGNOSIS:  Same  PROCEDURE:  1) Ultrasound guided left common femoral artery access 2) Aortogram 3) Right lower extremity angiogram with third order cannulation 4) Right popliteal artery drug-coated angioplasty (4x60 Inpact) 5) Right tibioperoneal trunk angioplasty (3 x 220 mm Sterling) 6) Right peroneal artery angioplasty (3x289m Sterling)  SURGEON:  TYevonne Aline HStanford Breed MD  ASSISTANT: none  ANESTHESIA:   local  ESTIMATED BLOOD LOSS: minimal  LOCAL MEDICATIONS USED:  LIDOCAINE  COUNTS: confirmed correct.  PATIENT DISPOSITION:  PACU - hemodynamically stable.  Delay start of Pharmacological VTE agent (>24hrs) due to surgical blood loss or risk of bleeding: no  INDICATION FOR PROCEDURE: HFABIAN WALDERis a 87y.o. male with right foot gangrene and noninvasive evidence of peripheral arterial disease.. After careful discussion of risks, benefits, and alternatives the patient was offered angiography with possible intervention. The patient understood and wished to proceed.  OPERATIVE FINDINGS: Terminal aorta and iliac arteries: Widely patent without flow-limiting stenosis  Right lower extremity: Common femoral artery: Patent without stenosis Profunda femoris artery: Patent without stenosis Superficial femoral artery: Patent without stenosis  Popliteal artery: Critical behind and below-knee stenosis (greater than 90%) Anterior tibial artery: Occluded Tibioperoneal trunk: Severe stenosis (70%) Peroneal artery: Severe, multifocal stenosis (greatest 70%) Posterior tibial artery: Occluded Pedal circulation: Fills via collateralization from the peroneal artery.  Severely disadvantaged in the mid and distal foot.  GLASS score. FP 4. IP 4. Stage III. High complexity disease.  WIfI score. 2 / 1 / 0. Clinical stage III. Moderate amputation risk.  DESCRIPTION OF PROCEDURE: After identification of the patient in the pre-operative holding area, the patient was transferred to the operating room. The patient was positioned supine on the operating room table. Anesthesia was induced. The groins was prepped and draped in standard fashion. A surgical pause was performed confirming correct patient, procedure, and operative location.  The left groin was anesthetized with subcutaneous injection of 1% lidocaine. Using ultrasound guidance, the left common femoral artery was accessed with micropuncture technique. Fluoroscopy was used to confirm cannulation over the femoral head. The 82F sheath was upsized to 62F.  A Benson wire was advanced into the distal aorta. Over the wire an omni flush catheter was advanced to the level of L2. Aortogram was performed - see above for details.  The right common iliac artery was selected with an omniflush catheter and guidewire. The wire was advanced into the common femoral artery. Over the wire the omni flush catheter was advanced into the external iliac artery. Selective angiography was performed - see above for details.  The decision was made to intervene. The patient was heparinized with 7,000 units of heparin. The 62F sheath was exchanged for a 5F x 45cm sheath. Selective angiography of the left lower extremity was performed prior to intervention.  The lesions were treated with: Right popliteal artery drug-coated angioplasty (4x60 Inpact)  Right tibioperoneal trunk angioplasty (3 x 220 mm Sterling) Right peroneal artery angioplasty (3x2248mSterling)  Completion angiography revealed: Resolution of stenoses and restoration of inline flow to the ankle. Disadvantaged pedal circulation  A perclose device was used to close the arteriotomy. Hemostasis was excellent upon completion.  Upon completion of the case instrument and sharps counts were confirmed correct. The patient was transferred to the  PACU in good condition. I was present for all portions of the procedure.  PLAN: ASA 61m PO QD. Plavix 753mPO QD. High intensity statin therapy. Optimized from a vascular standpoint.  ThYevonne AlineHaStanford BreedMD [facility] [address] Number: [phone]/18/2023 5:40 PM   CT HEAD WO CONTRAST (5MM)  Result Date: 11/19/2021 CLINICAL DATA:  Initial evaluation for altered mental status. EXAM: CT HEAD WITHOUT CONTRAST TECHNIQUE: Contiguous axial images were obtained from the base of the skull through the vertex without intravenous contrast. RADIATION DOSE REDUCTION: This exam was performed according to the departmental dose-optimization program which includes automated exposure control, adjustment of the mA and/or kV according to patient size and/or use of iterative reconstruction technique. COMPARISON:  Prior MRI from 05/22/2021. FINDINGS: Brain: Generalized age-related cerebral atrophy. Patchy and confluent hypodensity involving the supratentorial cerebral white matter, most consistent with chronic small vessel ischemic disease, fairly advanced in nature. No acute intracranial hemorrhage. No acute large vessel territory infarct. No mass lesion, mass effect or midline shift. Mild ventricular prominence related global parenchymal volume loss of hydrocephalus. No extra-axial fluid collection. Vascular: No abnormal hyperdense vessel. Calcified atherosclerosis present at the skull base. Skull: Scalp soft tissues demonstrate no acute  finding. Calvarium intact. Sinuses/Orbits: Globes orbital soft tissues demonstrate no acute finding. Prior ocular lens replacement on the right. Bilateral senescent calcifications noted. Moderate mucosal thickening throughout the paranasal sinuses with air-fluid level within the left maxillary sinus. Mastoid air cells are clear. Other: None. IMPRESSION: 1. No acute intracranial abnormality. 2. Generalized age-related cerebral atrophy with advanced chronic small vessel ischemic disease. 3. Moderate paranasal sinus disease with air-fluid level within the left maxillary sinus, suggesting acute sinusitis. Electronically Signed   By: BeJeannine Boga.D.   On: 11/19/2021 20:48   EEG adult  Result Date: 11/19/2021 KiGreta DoomMD     11/19/2021  7:49 PM History: [age]ear old male being evaluated for altered mental status Sedation: None Technique: This EEG was acquired with electrodes placed according to the International 10-20 electrode system (including Fp1, Fp2, F3, F4, C3, C4, P3, P4, O1, O2, T3, T4, T5, T6, A1, A2, Fz, Cz, Pz). The following electrodes were missing or displaced: none. Background: There are generalized periodic discharges with a frequency of 1 to 2 Hz with triphasic morphology.  In addition there is generalized irregular delta and theta activities throughout the recording.  There is no definite posterior dominant rhythm seen. Photic stimulation: Physiologic driving is now performed EEG Abnormalities: 1) generalized periodic discharges with triphasic morphology 2) generalized irregular slow activity 3) absent posterior dominant rhythm Clinical Interpretation: This EEG is most consistent with a generalized nonspecific cerebral dysfunction (encephalopathy).  Though triphasic waves can be associated with cortical irritability at times, would favor an encephalopathic origin at this time.  This pattern can be seen with cefepime induced neurotoxicity as well as other metabolic  encephalopathies. If there is a high concern for seizures, could consider long-term EEG monitoring, but encephalopathic pattern is favored at this time. McRoland RackMD [facility] [phone] 7pm- 7am, please page neurology on call as listed in [place]  VAS [place] WITH/WO TBI  Result Date: 11/17/2021  LOWER EXTREMITY DOPPLER STUDY Patient Name:  HeCARTEL MAUSSDate of Exam:   11/16/2021 Medical Rec #: [id number]       Accession #:    [id number] of Birth: 10/09/07/1936       Patient Gender: M Patient Age:   [age]ears [facility]  [facility] [place] [facility] WITH/WO TBI Referring Phys: ADAM MCDONALD --------------------------------------------------------------------------------  Indications: Ulceration. High Risk Factors: Hypertension, hyperlipidemia, Diabetes, past history of                    smoking, prior CVA.  Limitations: Today's exam was limited due to Movement. Comparison Study: No prior study on file Performing Technologist: Sharion Dove RVS  Examination Guidelines: A complete evaluation includes at minimum, Doppler waveform signals and systolic blood pressure reading at the level of bilateral brachial, anterior tibial, and posterior tibial arteries, when vessel segments are accessible. Bilateral testing is considered an integral part of a complete examination. Photoelectric Plethysmograph (PPG) waveforms and toe systolic pressure readings are included as required and additional duplex testing as needed. Limited examinations for reoccurring indications may be performed as noted.  ABI Findings: +---------+------------------+-----+-----------+--------+ Right    Rt Pressure (mmHg)IndexWaveform   Comment  +---------+------------------+-----+-----------+--------+ Brachial 159                    triphasic           +---------+------------------+-----+-----------+--------+ PTA      102               0.64 multiphasic          +---------+------------------+-----+-----------+--------+ DP       120               0.75 multiphasic         +---------+------------------+-----+-----------+--------+ Great Toe54                0.34                     +---------+------------------+-----+-----------+--------+ +---------+------------------+-----+-----------+------------------+ Left     Lt Pressure (mmHg)IndexWaveform   Comment            +---------+------------------+-----+-----------+------------------+ Brachial 148                    triphasic                     +---------+------------------+-----+-----------+------------------+ PTA      136               0.86            Sounds multiphasic +---------+------------------+-----+-----------+------------------+ DP       130               0.82 multiphasic                   +---------+------------------+-----+-----------+------------------+ Great Toe162               1.02                               +---------+------------------+-----+-----------+------------------+ +-------+-----------+-----------+------------+------------+ ABI/TBIToday's ABIToday's TBIPrevious ABIPrevious TBI +-------+-----------+-----------+------------+------------+ Right  0.75       0.34                                +-------+-----------+-----------+------------+------------+ Left   0.86       1.02                                +-------+-----------+-----------+------------+------------+  Summary: Right: Resting right ankle-brachial index indicates moderate right lower extremity arterial disease. The right toe-brachial index is abnormal. Left:  Resting left ankle-brachial index indicates mild left lower extremity arterial disease. The left toe-brachial index is normal. *See table(s) above for measurements and observations.  Electronically signed by Orlie Pollen on 11/17/2021 at 5:40:39 PM.    Final    DG Swallowing Func-Speech Pathology  Result Date: 11/17/2021 Table  formatting from the original result was not included. Objective Swallowing Evaluation: Type of Study: MBS-Modified Barium Swallow Study  Patient Details Name: JAQUAVEON BILAL MRN: [id number] Date of Birth: 03/16/1935 Today's Date: 11/17/2021 Time: SLP Start Time (ACUTE ONLY): 9833 -SLP Stop Time (ACUTE ONLY): 8250 SLP Time Calculation (min) (ACUTE ONLY): 13 min Past Medical History: Past Medical History: Diagnosis Date  Acute metabolic encephalopathy 5/39/7673  Arthritis   Diabetes mellitus   HTN (hypertension)   Hypercholesteremia   Iron deficiency   Pancreatitis, acute 06/02/2012  Diagnosed 05/2012   Personal history of colonic adenomas 04/15/2013  04/15/2013 2 diminutive polyps    Stroke [facility])   TIA (transient ischemic attack)  Past Surgical History: Past Surgical History: Procedure Laterality Date  ARTERY BIOPSY  02/26/2011  Procedure: BIOPSY TEMPORAL ARTERY;  Surgeon: Willey Blade, MD;  Location: [place];  Service: General;  Laterality: Left;  Left temporal artery biospy  [place] / LIGATION  02/26/2011  left side  UPPER GASTROINTESTINAL ENDOSCOPY   HPI: Pt is an 87 y.o. male who presented with worsening right leg and foot pain. CXR 8/11 completed due to fever: New focal consolidation of the right mid lung, concerning for infection. PMH: dementia, CVA, GERD, CKD 3B, hyperlipidemia, diabetes, neuropathy, history of CVA, CAD, hypertension, chronic pancreatitis, anemia, diabetic foot wound.  No data recorded  Recommendations for follow up therapy are one component of a multi-disciplinary discharge planning process, led by the attending physician.  Recommendations may be updated based on patient status, additional functional criteria and insurance authorization. Assessment / Plan / Recommendation   11/17/2021   1:50 PM Clinical Impressions Clinical Impression Pt exhibited mild oral holding and lingual  pumping with puree and liquids, but his oropharyngeal swallow mechainsm was otherwise [facility] with adequate movement and strength of oral and pharyngeal structures. No pharyngeal residue was noted throughout the study and pharyngeal stripping was WNL. Esophageal sweep revealed stasis of barium in the upper thoracic esophagus which was cleared with additional boluses of thin liquids. Considering esophageal stasis and pt's diagnosis of GERD, post-prandial aspiration is questioned. The etiology of esophageal stasis cannot be determined with this study; consider esophageal assessment (e.g., esophagram). Pt's current diet will be continued and SLP will follow briefly for education, but further SLP services will likely not be clinically indicated beyond that point. SLP Visit Diagnosis Dysphagia, oral phase (R13.11) Impact on safety and function Mild aspiration risk     11/17/2021   1:50 PM Treatment Recommendations Treatment Recommendations Therapy as outlined in treatment plan below     11/17/2021   1:50 PM Prognosis Prognosis for Safe Diet Advancement Good Barriers to Reach Goals Cognitive deficits   11/17/2021   1:50 PM Diet Recommendations SLP Diet Recommendations Regular solids;Thin liquid Liquid Administration via Cup;Straw Medication Administration Whole meds with liquid Compensations Slow rate;Follow solids with liquid;Minimize environmental distractions Postural Changes Seated upright at 90 degrees     11/17/2021   1:50 PM Other Recommendations Recommended Consults Consider esophageal assessment Oral Care Recommendations Oral care BID Follow Up Recommendations No  SLP follow up Assistance recommended at discharge Frequent or constant Supervision/Assistance Functional Status Assessment Patient has not had a recent decline in their functional status   11/17/2021   1:50 PM Frequency and Duration  Speech Therapy Frequency (ACUTE ONLY) min 1 x/week Treatment Duration 1 week     11/17/2021   1:50 PM Oral Phase Oral Phase Impaired  Oral - Thin Cup Holding of bolus;Lingual pumping;Reduced posterior propulsion Oral - Thin Straw Holding of bolus;Lingual pumping;Reduced posterior propulsion Oral - Puree Holding of bolus;Lingual pumping Oral - Regular WFL Oral - Pill Lingual pumping    11/17/2021   1:50 PM Pharyngeal Phase Pharyngeal Phase [facility]    11/17/2021   1:50 PM Cervical Esophageal Phase  Cervical Esophageal Phase [facility] Hardin Negus, [place], [place] Office number [phone] [name] 11/17/2021, 3:12 PM                     DG CHEST PORT 1 VIEW  Result Date: 11/17/2021 CLINICAL DATA:  Fever EXAM: PORTABLE CHEST 1 VIEW COMPARISON:  Chest x-ray dated May 21, 2021 FINDINGS: Cardiac and mediastinal contours within normal limits. New focal consolidation of the right mid lung. Bibasilar atelectasis. Linear opacity of the left mid lung, unchanged when compared prior and likely due to scarring. Large pleural effusion or pneumothorax. IMPRESSION: New focal consolidation of the right mid lung, concerning for infection. Recommend follow-up PA and lateral chest x-ray in 6-8 weeks to ensure resolution. Electronically Signed   By: Yetta Glassman M.D.   On: 11/17/2021 08:06   MR FOOT RIGHT WO CONTRAST  Result Date: 11/16/2021 CLINICAL DATA:  Foot swelling, diabetic, osteomyelitis suspected, xray done diabetic foot infection EXAM: MRI OF THE RIGHT FOREFOOT WITHOUT CONTRAST TECHNIQUE: Multiplanar, multisequence MR imaging of the right forefoot was performed. No intravenous contrast was administered. COMPARISON:  Right foot radiograph 11/15/2021 FINDINGS: Motion degraded exam. Bones/Joint/Cartilage There is erosion of the great toe distal phalangeal tuft as seen radiographically. There is questionable minimal marrow edema at the residual tip of the distal phalanx but largely preserved marrow signal. No other significant marrow signal alteration in the forefoot. Ligaments Poorly evaluate due to motion  artifact. Muscles and Tendons There is diffuse muscle edema muscle atrophy in the foot as is commonly seen in diabetics. No acute tendon tear in the forefoot. Soft tissues Diffuse soft tissue swelling of the foot. No defined fluid collection. IMPRESSION: Distal great toe ulceration with erosion of the distal phalangeal tuft and questionable minimal edema signal at the tip of the residual distal phalanx. There is largely preserved marrow signal. Findings could reflect very early osteomyelitis or reactive marrow change. No evidence of soft tissue abscess. Electronically Signed   By: Maurine Simmering M.D.   On: 11/16/2021 07:21   DG Foot Complete Right  Result Date: 11/15/2021 CLINICAL DATA:  Right foot pain EXAM: RIGHT FOOT COMPLETE - 3+ VIEW COMPARISON:  None Available. FINDINGS: Osseous structures are diffusely osteopenic. Ulcer involving the a distal aspect of the right great toe noted with erosion of the terminal distal phalanx which is likely exposed at the base of the ulcer. No acute fracture or dislocation. Advanced vascular calcifications are seen within the right foot. IMPRESSION: Ulcer involving the distal aspect of the right great toe with erosion of the terminal distal phalanx, likely exposed at the base of the ulcer. Electronically Signed   By: Fidela Salisbury M.D.   On: 11/15/2021 19:46     The results of  significant diagnostics from this hospitalization (including imaging, microbiology, ancillary and laboratory) are listed below for reference.     Microbiology: Recent Results (from the past 240 hour(s))  Urine Culture     Status: None   Collection Time: 12/10/21  2:39 PM   Specimen: Urine, Clean Catch  Result Value Ref Range Status   Specimen Description URINE, CLEAN CATCH  Final   Special Requests NONE  Final   Culture   Final    NO GROWTH Performed at [facility], [address], [place], [place] [address]    Report Status 12/11/2021 FINAL  Final     Labs: BNP (last 3  results) No results for input(s): "BNP" in the last 8760 hours. Basic Metabolic Panel: Recent Labs  Lab 12/09/21 2147 12/10/21 1543 12/11/21 0312 12/12/21 0253  NA 134* 135 135 134*  K 4.2 4.5 4.5 4.1  CL 98 101 100 105  CO2 '27 27 27 ' 20*  GLUCOSE 121* 97 116* 74  BUN 28* 23 19 <5*  CREATININE 2.13* 1.79* 1.61* 0.71  CALCIUM 9.4 9.1 9.2 7.5*  MG  --   --  2.2 1.7   Liver Function Tests: Recent Labs  Lab 12/09/21 2147  AST 20  ALT 14  ALKPHOS 85  BILITOT 0.7  PROT 8.8*  ALBUMIN 2.8*   No results for input(s): "LIPASE", "AMYLASE" in the last 168 hours. Recent Labs  Lab 12/09/21 2147  AMMONIA 19   CBC: Recent Labs  Lab 12/09/21 2147 12/10/21 0710 12/11/21 0312 12/12/21 0558  WBC 9.8 10.3 8.9 11.7*  NEUTROABS 4.9  --   --   --   HGB 10.1* 10.1* 11.1* 10.4*  HCT 30.3* 31.2* 33.3* 31.3*  MCV 89.9 91.8 88.8 90.2  PLT 299 268 321 308   Cardiac Enzymes: No results for input(s): "CKTOTAL", "CKMB", "CKMBINDEX", "TROPONINI" in the last 168 hours. BNP: Invalid input(s): "POCBNP" CBG: Recent Labs  Lab 12/11/21 1714 12/11/21 2035 12/12/21 0546 12/12/21 0802 12/12/21 1152  GLUCAP 169* 130* 108* 132* 156*   D-Dimer No results for input(s): "DDIMER" in the last 72 hours. Hgb A1c Recent Labs    12/10/21 0006  HGBA1C 8.9*   Lipid Profile No results for input(s): "CHOL", "HDL", "LDLCALC", "TRIG", "CHOLHDL", "LDLDIRECT" in the last 72 hours. Thyroid function studies Recent Labs    12/10/21 1543  TSH 1.964   Anemia work up Recent Labs    12/10/21 1543 12/11/21 0312  VITAMINB12 558  --   FOLATE  --  11.1   Urinalysis    Component Value Date/Time   COLORURINE YELLOW 12/10/2021 0636   APPEARANCEUR HAZY (A) 12/10/2021 0636   LABSPEC 1.011 12/10/2021 0636   PHURINE 7.0 12/10/2021 0636   GLUCOSEU NEGATIVE 12/10/2021 0636   HGBUR SMALL (A) 12/10/2021 0636   BILIRUBINUR NEGATIVE 12/10/2021 0636   BILIRUBINUR negative 09/27/2015 1629   BILIRUBINUR neg  07/01/2012 0902   KETONESUR NEGATIVE 12/10/2021 0636   PROTEINUR 100 (A) 12/10/2021 0636   UROBILINOGEN 4.0 09/27/2015 1629   NITRITE NEGATIVE 12/10/2021 0636   LEUKOCYTESUR LARGE (A) 12/10/2021 0636   Sepsis Labs Recent Labs  Lab 12/09/21 2147 12/10/21 0710 12/11/21 0312 12/12/21 0558  WBC 9.8 10.3 8.9 11.7*   Microbiology Recent Results (from the past 240 hour(s))  Urine Culture     Status: None   Collection Time: 12/10/21  2:39 PM   Specimen: Urine, Clean Catch  Result Value Ref Range Status   Specimen Description URINE, CLEAN CATCH  Final   Special Requests NONE  Final   Culture   Final    NO GROWTH Performed at [facility], [place] [address] [place], [place] [address]    Report Status 12/11/2021 FINAL  Final     Time coordinating discharge:  I have spent 35 minutes face to face with the patient and on the ward discussing the patients care, assessment, plan and disposition with other care givers. >50% of the time was devoted counseling the patient about the risks and benefits of treatment/Discharge disposition and coordinating care.   SIGNED:   Damita Lack, MD  [facility] 12/12/2021, 1:12 PM   If 7PM-7AM, please contact night-coverage

## 2021-12-12 NOTE — Progress Notes (Signed)
Patient will do 4 weeks of oral antibiotics per ID, wound care. I will have him follow up with me in the office week and I have also placed a wound care consult.   Recommend daily dressing changes with betaine and dry dressing.

## 2021-12-13 ENCOUNTER — Other Ambulatory Visit (HOSPITAL_COMMUNITY): Payer: Self-pay

## 2021-12-13 LAB — GLUCOSE, CAPILLARY: Glucose-Capillary: 95 mg/dL (ref 70–99)

## 2021-12-13 NOTE — Progress Notes (Signed)
DISCHARGE NOTE [facility] to be discharged Skilled nursing facility per MD order.  Skin clean, dry and intact without evidence of skin break down, no evidence of skin tears noted. IV catheter discontinued intact. Site without signs and symptoms of complications. Dressing and pressure applied. Pt denies pain at the site currently. No complaints noted.  Patient free of lines, drains, and wounds.  Discharge packet assembled. An After Visit Summary (AVS) was printed and given to the [facility] personnel. Patient escorted via stretcher and discharged to [facility] via ambulance. Report called to accepting facility no answer.  Rockie Neighbours RN

## 2021-12-19 ENCOUNTER — Encounter (HOSPITAL_BASED_OUTPATIENT_CLINIC_OR_DEPARTMENT_OTHER): Payer: Medicare Other | Attending: General Surgery | Admitting: General Surgery

## 2021-12-19 DIAGNOSIS — I2510 Atherosclerotic heart disease of native coronary artery without angina pectoris: Secondary | ICD-10-CM | POA: Insufficient documentation

## 2021-12-19 DIAGNOSIS — L97514 Non-pressure chronic ulcer of other part of right foot with necrosis of bone: Secondary | ICD-10-CM | POA: Insufficient documentation

## 2021-12-19 DIAGNOSIS — M86171 Other acute osteomyelitis, right ankle and foot: Secondary | ICD-10-CM | POA: Insufficient documentation

## 2021-12-19 DIAGNOSIS — L97516 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 without evidence of necrosis: Secondary | ICD-10-CM | POA: Diagnosis not present

## 2021-12-19 DIAGNOSIS — N1832 Chronic kidney disease, stage 3b: Secondary | ICD-10-CM | POA: Diagnosis not present

## 2021-12-19 DIAGNOSIS — E1151 Type 2 diabetes mellitus with diabetic peripheral angiopathy without gangrene: Secondary | ICD-10-CM | POA: Diagnosis not present

## 2021-12-19 DIAGNOSIS — Z87891 Personal history of nicotine dependence: Secondary | ICD-10-CM | POA: Insufficient documentation

## 2021-12-19 DIAGNOSIS — M1990 Unspecified osteoarthritis, unspecified site: Secondary | ICD-10-CM | POA: Insufficient documentation

## 2021-12-19 DIAGNOSIS — E1142 Type 2 diabetes mellitus with diabetic polyneuropathy: Secondary | ICD-10-CM | POA: Diagnosis present

## 2021-12-19 DIAGNOSIS — E1122 Type 2 diabetes mellitus with diabetic chronic kidney disease: Secondary | ICD-10-CM | POA: Diagnosis not present

## 2021-12-19 DIAGNOSIS — E11621 Type 2 diabetes mellitus with foot ulcer: Secondary | ICD-10-CM | POA: Diagnosis not present

## 2021-12-19 DIAGNOSIS — I129 Hypertensive chronic kidney disease with stage 1 through stage 4 chronic kidney disease, or unspecified chronic kidney disease: Secondary | ICD-10-CM | POA: Insufficient documentation

## 2021-12-19 DIAGNOSIS — K861 Other chronic pancreatitis: Secondary | ICD-10-CM | POA: Diagnosis not present

## 2021-12-19 DIAGNOSIS — T8131XS Disruption of external operation (surgical) wound, not elsewhere classified, sequela: Secondary | ICD-10-CM | POA: Diagnosis not present

## 2021-12-19 NOTE — Progress Notes (Signed)
MAXEN, ROWLAND T ([id number]) . Visit Report for 12/19/2021 Abuse Risk Screen Details Patient Name: Date of Service: Justin [name], Justin [name] 12/19/2021 9:00 A M Medical Record Number: [id number] Patient Account Number: [id number] Date of Birth/Sex: Treating RN: [birth year] (87 y.o. [name] Primary Care Ceonna Frazzini: [place] Other Clinician: Referring Mayerly Kaman: Treating Deshana Rominger/Extender: Betsy Pries in Treatment: 0 Abuse Risk Screen Items Answer ABUSE RISK SCREEN: Has anyone close to you tried to hurt or harm you recentlyo No Do you feel uncomfortable with anyone in your familyo No Has anyone forced you do things that you didnt want to doo No Electronic Signature(s) Signed: 12/19/2021 4:55:43 PM By: Baruch Gouty RN, BSN Entered By: Baruch Gouty on 12/19/2021 09:47:03 -------------------------------------------------------------------------------- Activities of Daily Living Details Patient Name: Date of Service: Justin [name], Justin [name] 12/19/2021 9:00 A M Medical Record Number: [id number] Patient Account Number: [id number] Date of Birth/Sex: Treating RN: 01-Nov-1934 (87 y.o. [name] Primary Care Juluis Fitzsimmons: [place] Other Clinician: Referring Dory Demont: Treating Taijon Vink/Extender: Betsy Pries in Treatment: 0 Activities of Daily Living Items Answer Activities of Daily Living (Please select one for each item) Drive Automobile Not Able T Medications ake Need Assistance Use T elephone Need Assistance Care for Appearance Need Assistance Use T oilet Need Assistance Bath / Shower Need Assistance Dress Self Need Assistance Feed Self Need Assistance Walk Not Able Get In / Out Bed Need Assistance Housework Not Able Prepare Meals Not Able Handle Money Not Able Shop for Self Not Able Electronic Signature(s) Signed: 12/19/2021 4:55:43 PM By: Baruch Gouty RN, BSN Entered By: Baruch Gouty on  12/19/2021 09:48:00 -------------------------------------------------------------------------------- Education Screening Details Patient Name: Date of Service: Justin Staggers T. 12/19/2021 9:00 A M Medical Record Number: [id number] Patient Account Number: [id number] Date of Birth/Sex: Treating RN: Jun 29, 1934 (87 y.o. [name] Primary Care Hazleigh Mccleave: [place] Other Clinician: Referring Trent Theisen: Treating Phillip Sandler/Extender: Betsy Pries in Treatment: 0 Primary Learner Assessed: Caregiver granddaughter Reason Patient is not Primary Learner: dementia Learning Preferences/Education Level/Primary Language Learning Preference: Explanation, Demonstration, Printed Material Highest Education Level: High School Preferred Language: English Cognitive Barrier Language Barrier: No Translator Needed: No Memory Deficit: No Emotional Barrier: No Cultural/Religious Beliefs Affecting Medical Care: No Physical Barrier Impaired Vision: No Impaired Hearing: No Decreased Hand dexterity: Yes Limitations: right hand weakness Knowledge/Comprehension Knowledge Level: Medium Comprehension Level: Medium Ability to understand written instructions: Medium Ability to understand verbal instructions: Medium Motivation Anxiety Level: Calm Cooperation: Cooperative Education Importance: Acknowledges Need Interest in Health Problems: Asks Questions Perception: Coherent Willingness to Engage in Self-Management High Activities: Readiness to Engage in Self-Management High Activities: Electronic Signature(s) Signed: 12/19/2021 4:55:43 PM By: Baruch Gouty RN, BSN Entered By: Baruch Gouty on 12/19/2021 09:49:12 -------------------------------------------------------------------------------- Fall Risk Assessment Details Patient Name: Date of Service: Justin Staggers T. 12/19/2021 9:00 A M Medical Record Number: [id number] Patient Account Number:  [id number] Date of Birth/Sex: Treating RN: February 22, 1935 (87 y.o. [name] Primary Care Danyla Wattley: [place] Other Clinician: Referring Jaree Trinka: Treating Joyce Heitman/Extender: Betsy Pries in Treatment: 0 Fall Risk Assessment Items Have you had 2 or more falls in the last 12 monthso 0 No Have you had any fall that resulted in injury in the last 12 monthso 0 No FALLS RISK SCREEN History of falling - immediate or within 3 months 0 No Secondary diagnosis (Do you have 2 or more medical diagnoseso) 0 No Ambulatory aid None/bed rest/wheelchair/nurse 0 Yes Crutches/cane/walker 0  No Furniture 0 No Intravenous therapy Access/Saline/Heparin Lock 0 No Gait/Transferring Normal/ bed rest/ wheelchair 0 Yes Weak (short steps with or without shuffle, stooped but able to lift head while walking, may seek 0 No support from furniture) Impaired (short steps with shuffle, may have difficulty arising from chair, head down, impaired 0 No balance) Mental Status Oriented to own ability 0 Yes Electronic Signature(s) Signed: 12/19/2021 4:55:43 PM By: Baruch Gouty RN, BSN Entered By: Baruch Gouty on 12/19/2021 09:49:47 -------------------------------------------------------------------------------- Foot Assessment Details Patient Name: Date of Service: Justin Staggers T. 12/19/2021 9:00 A M Medical Record Number: [id number] Patient Account Number: [id number] Date of Birth/Sex: Treating RN: October 22, 1934 (87 y.o. [name] Primary Care Denise Washburn: [place] Other Clinician: Referring Yuan Gann: Treating Donyell Carrell/Extender: Betsy Pries in Treatment: 0 Foot Assessment Items Site Locations + = Sensation present, - = Sensation absent, C = Callus, U = Ulcer R = Redness, W = Warmth, M = Maceration, PU = Pre-ulcerative lesion F = Fissure, S = Swelling, D = Dryness Assessment Right: Left: Other Deformity: No No Prior Foot  Ulcer: Yes No Prior Amputation: Yes No Charcot Joint: No No Ambulatory Status: Non-ambulatory Assistance Device: Wheelchair Gait: Electronic Signature(s) Signed: 12/19/2021 4:55:43 PM By: Baruch Gouty RN, BSN Entered By: Baruch Gouty on 12/19/2021 09:53:39 -------------------------------------------------------------------------------- Nutrition Risk Screening Details Patient Name: Date of Service: Justin Staggers T. 12/19/2021 9:00 A M Medical Record Number: [id number] Patient Account Number: [id number] Date of Birth/Sex: Treating RN: 10-Jan-1935 (87 y.o. [name] Primary Care Laverne Klugh: [place] Other Clinician: Referring Zaryah Seckel: Treating Loy Little/Extender: Betsy Pries in Treatment: 0 Height (in): 72 Weight (lbs): 146 Body Mass Index (BMI): 19.8 Nutrition Risk Screening Items Score Screening NUTRITION RISK SCREEN: I have an illness or condition that made me change the kind and/or amount of food I eat 0 No I eat fewer than two meals per day 3 Yes I eat few fruits and vegetables, or milk products 0 No I have three or more drinks of beer, liquor or wine almost every day 0 No I have tooth or mouth problems that make it hard for me to eat 0 No I don't always have enough money to buy the food I need 0 No I eat alone most of the time 0 No I take three or more different prescribed or over-the-counter drugs a day 1 Yes Without wanting to, I have lost or gained 10 pounds in the last six months 0 No I am not always physically able to shop, cook and/or feed myself 0 No Nutrition Protocols Good Risk Protocol Provide education on elevated blood Moderate Risk Protocol 0 sugars and impact on wound healing, as applicable High Risk Proctocol Risk Level: Moderate Risk Score: 4 Electronic Signature(s) Signed: 12/19/2021 4:55:43 PM By: Baruch Gouty RN, BSN Entered By: Baruch Gouty on 12/19/2021 09:50:51

## 2021-12-19 NOTE — Progress Notes (Addendum)
MILAS, SCHAPPELL T ([id number]) . Visit Report for 12/19/2021 Chief Complaint Document Details Patient Name: Date of Service: Justin [name], Justin [name] 12/19/2021 9:00 [employer] Record Number: [id number] Patient Account Number: [id number] Date of Birth/Sex: Treating RN: [birth year] (87 y.o. M) Primary Care Provider: [facility] [place] Other Clinician: Referring Provider: Treating Provider/Extender: Betsy Pries in Treatment: 0 Information Obtained from: Patient Chief Complaint Patients presents for treatment of open diabetic ulcers and dehiscence of surgical site Electronic Signature(s) Signed: 12/19/2021 10:44:54 AM By: Fredirick Maudlin MD FACS Entered By: Fredirick Maudlin on 12/19/2021 10:44:53 -------------------------------------------------------------------------------- Debridement Details Patient Name: Date of Service: Justin Staggers T. 12/19/2021 9:00 A M Medical Record Number: [id number] Patient Account Number: [id number] Date of Birth/Sex: Treating RN: March 02, 1935 (87 y.o. [name] Primary Care Provider: [facility] [place] Other Clinician: Referring Provider: Treating Provider/Extender: Betsy Pries in Treatment: 0 Debridement Performed for Assessment: Wound #1 Right Amputation Site - Toe Performed By: Physician Fredirick Maudlin, MD Debridement Type: Debridement Severity of Tissue Pre Debridement: Necrosis of bone Level of Consciousness (Pre-procedure): Awake and Alert Pre-procedure Verification/Time Out Yes - 10:10 Taken: Start Time: 10:11 Pain Control: Lidocaine 4% T opical Solution T Area Debrided (L x W): otal 2.8 (cm) x 1.5 (cm) = 4.2 (cm) Tissue and other material debrided: Viable, Non-Viable, Slough, Subcutaneous, Slough Level: Skin/Subcutaneous Tissue Debridement Description: Excisional Instrument: Curette, Forceps, Scissors Bleeding: Minimum Hemostasis Achieved: Pressure Procedural Pain: 0 Post  Procedural Pain: 0 Response to Treatment: Procedure was tolerated well Level of Consciousness (Post- Awake and Alert procedure): Post Debridement Measurements of Total Wound Length: (cm) 2.8 Width: (cm) 1.5 Depth: (cm) 2.1 Volume: (cm) 6.927 Character of Wound/Ulcer Post Debridement: Requires Further Debridement Severity of Tissue Post Debridement: Necrosis of bone Post Procedure Diagnosis Same as Pre-procedure Notes scribed by Baruch Gouty, RN for Dr. Celine Ahr Electronic Signature(s) Signed: 12/19/2021 12:25:45 PM By: Fredirick Maudlin MD FACS Signed: 12/19/2021 4:55:43 PM By: Baruch Gouty RN, BSN Entered By: Baruch Gouty on 12/19/2021 11:52:40 -------------------------------------------------------------------------------- Debridement Details Patient Name: Date of Service: Justin Staggers T. 12/19/2021 9:00 A M Medical Record Number: [id number] Patient Account Number: [id number] Date of Birth/Sex: Treating RN: May 30, 1934 (87 y.o. [name] Primary Care Provider: [facility] [place] Other Clinician: Referring Provider: Treating Provider/Extender: Betsy Pries in Treatment: 0 Debridement Performed for Assessment: Wound #2 Right T Third oe Performed By: Physician Fredirick Maudlin, MD Debridement Type: Debridement Severity of Tissue Pre Debridement: Necrosis of bone Level of Consciousness (Pre-procedure): Awake and Alert Pre-procedure Verification/Time Out Yes - 10:10 Taken: Start Time: 10:11 Pain Control: Lidocaine 4% T opical Solution T Area Debrided (L x W): otal 0.9 (cm) x 1 (cm) = 0.9 (cm) Tissue and other material debrided: Viable, Non-Viable, Eschar, Slough, Subcutaneous, Slough Level: Skin/Subcutaneous Tissue Debridement Description: Excisional Instrument: Curette Bleeding: Minimum Hemostasis Achieved: Pressure Procedural Pain: 0 Post Procedural Pain: 0 Response to Treatment: Procedure was tolerated well Level of  Consciousness (Post- Awake and Alert procedure): Post Debridement Measurements of Total Wound Length: (cm) 0.9 Width: (cm) 0.1 Depth: (cm) 0.2 Volume: (cm) 0.014 Character of Wound/Ulcer Post Debridement: Improved Severity of Tissue Post Debridement: Necrosis of bone Post Procedure Diagnosis Same as Pre-procedure Notes scribed by Baruch Gouty, RN for Dr. Celine Ahr Electronic Signature(s) Signed: 12/19/2021 12:25:45 PM By: Fredirick Maudlin MD FACS Signed: 12/19/2021 4:55:43 PM By: Baruch Gouty RN, BSN Entered By: Baruch Gouty on 12/19/2021 11:52:50 -------------------------------------------------------------------------------- HPI Details Patient Name: Date of Service: Justin Grant Fontana  T. 12/19/2021 9:00 A M Medical Record Number: [id number] Patient Account Number: [id number] Date of Birth/Sex: Treating RN: 30-Dec-1934 (87 y.o. M) Primary Care Provider: [facility] [place] Other Clinician: Referring Provider: Treating Provider/Extender: Betsy Pries in Treatment: 0 History of Present Illness HPI Description: ADMISSION 12/19/2021 This is an 87 year old man with multiple medical problems including type 2 diabetes mellitus, chronic pancreatitis, coronary artery disease, peripheral vascular disease, stroke and TIA. He was admitted to the hospital on November 15, 2021 with a diabetic foot infection. An MRI was performed on admission that was questionable for early osteomyelitis. He was seen in consultation by both podiatry and vascular surgery. Due to limb threatening ischemia, he underwent a lower extremity angiogram with multiple angioplasties on August 18. He then had amputation of his right second toe on August 20. There was frank pus present which was cultured. He was placed on a course of oral antibiotics. He subsequently was discharged but returned due to altered mental status on September 2. Repeat MRI was performed that was again concerning for  osteomyelitis. Infectious disease was consulted. A 4-week course of oral doxycycline and cefadroxil was prescribed. Podiatry referred him to the wound care center for further evaluation and management. On exam, there is a recent right second toe amputation site. The Prolene sutures are gaping and the wound has a fairly deep cavity; it does probe to bone. There is no purulent drainage or odor, but there is quite a bit of fat necrosis present. On the lateral aspect of his third toe, there is an ulcer that has a leathery eschar overlying it. Underneath, bone and tendon are exposed. No purulent drainage or malodor from this wound, either. Electronic Signature(s) Signed: 12/19/2021 10:55:01 AM By: Fredirick Maudlin MD FACS Entered By: Fredirick Maudlin on 12/19/2021 10:55:01 -------------------------------------------------------------------------------- Physical Exam Details Patient Name: Date of Service: Justin Staggers T. 12/19/2021 9:00 A M Medical Record Number: [id number] Patient Account Number: [id number] Date of Birth/Sex: Treating RN: 1934-09-01 (87 y.o. M) Primary Care Provider: [facility] [place] Other Clinician: Referring Provider: Treating Provider/Extender: Betsy Pries in Treatment: 0 Constitutional . . . . No acute distress. Respiratory Normal work of breathing on room air.. Cardiovascular Dorsalis pedis pulse is palpable. Notes 12/19/2021: On exam, there is a recent right second toe amputation site. The Prolene sutures are gaping and the wound has a fairly deep cavity; it does probe to bone. There is no purulent drainage or odor, but there is quite a bit of fat necrosis present. On the lateral aspect of his third toe, there is an ulcer that has a leathery eschar overlying it. Underneath, bone and tendon are exposed. No purulent drainage or malodor from this wound, either. Electronic Signature(s) Signed: 12/19/2021 11:10:36 AM By: Fredirick Maudlin MD  FACS Previous Signature: 12/19/2021 10:55:11 AM Version By: Fredirick Maudlin MD FACS Entered By: Fredirick Maudlin on 12/19/2021 11:10:36 -------------------------------------------------------------------------------- Physician Orders Details Patient Name: Date of Service: Justin Staggers T. 12/19/2021 9:00 A M Medical Record Number: [id number] Patient Account Number: [id number] Date of Birth/Sex: Treating RN: [birth year] (87 y.o. [name] Primary Care Provider: [facility] [place] Other Clinician: Referring Provider: Treating Provider/Extender: Betsy Pries in Treatment: 0 Verbal / Phone Orders: No Diagnosis Coding ICD-10 Coding Code Description E11.42 Type 2 diabetes mellitus with diabetic polyneuropathy N18.32 Chronic kidney disease, stage 3b I73.9 Peripheral vascular disease, unspecified I25.10 Atherosclerotic heart disease of native coronary artery without angina pectoris I10 Essential (primary) hypertension K86.1 Other chronic  pancreatitis E11.621 Type 2 diabetes mellitus with foot ulcer Follow-up Appointments ppointment in 1 week. - Dr. Celine Ahr RM 1 with Vaughan Basta Return A Anesthetic Wound #1 Right Amputation Site - Toe (In clinic) Topical Lidocaine 4% applied to wound bed Wound #2 Right T Third oe (In clinic) Topical Lidocaine 4% applied to wound bed Bathing/ Shower/ Hygiene Other Bathing/Shower/Hygiene Orders/Instructions: - may wash feet with soap and water with dressing changes Off-Loading Heel suspension boot to: - podus boots bilateral feet Wound Treatment Wound #1 - Amputation Site - Toe Wound Laterality: Right Cleanser: Soap and Water 1 x Per Day/30 Days Discharge Instructions: May shower and wash wound with dial antibacterial soap and water prior to dressing change. Cleanser: Wound Cleanser 1 x Per Day/30 Days Discharge Instructions: Cleanse the wound with wound cleanser prior to applying a clean dressing using gauze sponges, not  tissue or cotton balls. Peri-Wound Care: Sween Lotion (Moisturizing lotion) 1 x Per Day/30 Days Discharge Instructions: Apply moisturizing lotion to both feet with dressing changes Prim Dressing: KerraCel Ag Gelling Fiber Dressing, 2x2 in (silver alginate) 1 x Per Day/30 Days ary Discharge Instructions: Apply silver alginate to wound bed pack lightly into wound bed Secondary Dressing: Woven Gauze Sponge, Non-Sterile 4x4 in 1 x Per Day/30 Days Discharge Instructions: Apply over primary dressing as directed. Secured With: [employer], 4.5x3.1 (in/yd) 1 x Per Day/30 Days Discharge Instructions: Secure with Kerlix as directed. Secured With: 35M Medipore H Soft Cloth Surgical T ape, 4 x 10 (in/yd) 1 x Per Day/30 Days Discharge Instructions: Secure with tape as directed. Wound #2 - T Third oe Wound Laterality: Right Cleanser: Soap and Water 1 x Per Day/30 Days Discharge Instructions: May shower and wash wound with dial antibacterial soap and water prior to dressing change. Cleanser: Wound Cleanser 1 x Per Day/30 Days Discharge Instructions: Cleanse the wound with wound cleanser prior to applying a clean dressing using gauze sponges, not tissue or cotton balls. Peri-Wound Care: Sween Lotion (Moisturizing lotion) 1 x Per Day/30 Days Discharge Instructions: Apply moisturizing lotion to both feet with dressing changes Prim Dressing: KerraCel Ag Gelling Fiber Dressing, 2x2 in (silver alginate) ary 1 x Per Day/30 Days Discharge Instructions: Apply silver alginate to wound bed Secondary Dressing: Woven Gauze Sponge, Non-Sterile 4x4 in 1 x Per Day/30 Days Discharge Instructions: Apply over primary dressing as directed. Secured With: [employer], 4.5x3.1 (in/yd) 1 x Per Day/30 Days Discharge Instructions: Secure with Kerlix as directed. Secured With: 35M Medipore H Soft Cloth Surgical T ape, 4 x 10 (in/yd) 1 x Per Day/30 Days Discharge Instructions: Secure with tape as directed. Patient  Medications llergies: No Known Allergies A Notifications Medication Indication Start End prior to debridement 12/19/2021 lidocaine DOSE topical 4 % cream - cream topical Electronic Signature(s) Signed: 12/19/2021 12:25:45 PM By: Fredirick Maudlin MD FACS Entered By: Fredirick Maudlin on 12/19/2021 11:21:25 -------------------------------------------------------------------------------- Problem List Details Patient Name: Date of Service: Justin Staggers T. 12/19/2021 9:00 A M Medical Record Number: [id number] Patient Account Number: [id number] Date of Birth/Sex: Treating RN: [birth year] (87 y.o. M) Primary Care Provider: [facility] [place] Other Clinician: Referring Provider: Treating Provider/Extender: Betsy Pries in Treatment: 0 Active Problems ICD-10 Encounter Code Description Active Date MDM Diagnosis E11.42 Type 2 diabetes mellitus with diabetic polyneuropathy 12/19/2021 No Yes N18.32 Chronic kidney disease, stage 3b 12/19/2021 No Yes I73.9 Peripheral vascular disease, unspecified 12/19/2021 No Yes I25.10 Atherosclerotic heart disease of native coronary artery without angina pectoris 12/19/2021 No Yes I10 Essential (  primary) hypertension 12/19/2021 No Yes K86.1 Other chronic pancreatitis 12/19/2021 No Yes E11.621 Type 2 diabetes mellitus with foot ulcer 12/19/2021 No Yes T81.31XS Disruption of external operation (surgical) wound, not elsewhere classified, 12/19/2021 No Yes sequela L97.514 Non-pressure chronic ulcer of other part of right foot with necrosis of bone 12/19/2021 No Yes L97.516 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 12/19/2021 9:00 A M Medical Record Number: [id number] Patient Account Number: [id number] Date of Birth/Sex: Treating RN: [birth year] (87 y.o. M) Primary Care Provider: [facility] [place] Other Clinician: Referring Provider: Treating Provider/Extender: Betsy Pries in Treatment: 0 Subjective Chief Complaint Information obtained from Patient Patients presents for treatment of open diabetic ulcers and dehiscence of surgical site History of Present Illness (HPI) ADMISSION 12/19/2021 This is an 87 year old man with multiple medical problems including type 2 diabetes mellitus, chronic pancreatitis, coronary artery disease, peripheral vascular disease, stroke and TIA. He was admitted to the hospital on November 15, 2021 with a diabetic foot infection. An MRI was performed on admission that was questionable for early osteomyelitis. He was seen in consultation by both podiatry and vascular surgery. Due to limb threatening ischemia, he underwent a lower extremity angiogram with multiple angioplasties on August 18. He then had amputation of his right second toe on August 20. There was frank pus present which was cultured. He was placed on a course of oral antibiotics. He subsequently was discharged but returned due to altered mental status on September 2. Repeat MRI was performed that was again concerning for osteomyelitis. Infectious disease was consulted. A 4-week course of oral doxycycline and cefadroxil was prescribed. Podiatry referred him to the wound care center for further evaluation and management. On exam, there is a recent right second toe amputation site. The Prolene sutures are gaping and the wound has a fairly deep cavity; it does probe to  bone. There is no purulent drainage or odor, but there is quite a bit of fat necrosis present. On the lateral aspect of his third toe, there is an ulcer that has a leathery eschar overlying it. Underneath, bone and tendon are exposed. No purulent drainage or malodor from this wound, either. Patient History Information obtained from Patient, Chart. Allergies No Known Allergies Family History Diabetes, Heart Disease - Mother, Hypertension - Mother,Siblings, No family history of Cancer, Hereditary Spherocytosis, Kidney Disease, Lung Disease, Seizures, Stroke, Thyroid Problems, Tuberculosis. Social History Former smoker, Marital Status - Married, Alcohol Use - Never, Drug Use - No History, Caffeine Use - Daily - coffee. Medical History Eyes Denies history of Cataracts, Glaucoma, Optic Neuritis Hematologic/Lymphatic Patient has history of Anemia Cardiovascular Patient has history of Coronary Artery Disease, Hypertension, Peripheral Arterial Disease Endocrine Patient has history of Type II Diabetes Denies history of Type I Diabetes Genitourinary Denies history of End Stage Renal Disease Integumentary (Skin) Denies history of History of Burn Musculoskeletal Patient has history of Osteoarthritis Neurologic Denies history of Neuropathy Oncologic Denies history of Received Chemotherapy, Received Radiation Psychiatric Patient has history of Anorexia/bulimia - poor apetite Denies history of Confinement Anxiety Patient is treated with Insulin. Blood sugar is  tested. Hospitalization/Surgery History - right 2nd toe amputation 11/26/21. - agram. - peripheral balloon angioplasty 11/24/21. - temporal artery biopsy. - back surgery. - neck surgery. - cholecystectomy. Medical A Surgical History Notes nd Gastrointestinal chronic pancreatitis Genitourinary CKD st 3, testosterone deficiency Neurologic CVA Review of Systems (ROS) Constitutional Symptoms (General Health) Denies complaints or  symptoms of Fatigue, Fever, Chills, Marked Weight Change. Eyes Denies complaints or symptoms of Dry Eyes, Vision Changes, Glasses / Contacts. Ear/Nose/Mouth/Throat Denies complaints or symptoms of Chronic sinus problems or rhinitis. Respiratory Denies complaints or symptoms of Chronic or frequent coughs, Shortness of Breath. Cardiovascular Denies complaints or symptoms of Chest pain. Gastrointestinal Denies complaints or symptoms of Frequent diarrhea, Nausea, Vomiting. Endocrine Denies complaints or symptoms of Heat/cold intolerance. Genitourinary Denies complaints or symptoms of Frequent urination. Integumentary (Skin) Complains or has symptoms of Wounds - right foot. Musculoskeletal Complains or has symptoms of Muscle Weakness. Neurologic Denies complaints or symptoms of Numbness/parasthesias. Psychiatric Denies complaints or symptoms of Claustrophobia, Suicidal. Objective Constitutional No acute distress. Vitals Time Taken: 9:21 AM, Height: 72 in, Weight: 146 lbs, BMI: 19.8, Temperature: 97.9 F, Pulse: 86 bpm, Respiratory Rate: 18 breaths/min, Blood Pressure: 136/66 mmHg, Capillary Blood Glucose: 123 mg/dl. General Notes: glucose per facility record yesterday evening Respiratory Normal work of breathing on room air.. Cardiovascular Dorsalis pedis pulse is palpable. General Notes: 12/19/2021: On exam, there is a recent right second toe amputation site. The Prolene sutures are gaping and the wound has a fairly deep cavity; it does probe to bone. There is no purulent drainage or odor, but there is quite a bit of fat necrosis present. On the lateral aspect of his third toe, there is an ulcer that has a leathery eschar overlying it. Underneath, bone and tendon are exposed. No purulent drainage or malodor from this wound, either. Integumentary (Hair, Skin) Wound #1 status is Open. Original cause of wound was Surgical Injury. The date acquired was: 11/26/2021. The wound is located on  the Right Amputation Site - T The wound measures 2.8cm length x 1.5cm width x 2.1cm depth; 3.299cm^2 area and 6.927cm^3 volume. There is bone and Fat Layer (Subcutaneous oe. Tissue) exposed. There is no tunneling or undermining noted. There is a medium amount of serosanguineous drainage noted. The wound margin is distinct with the outline attached to the wound base. There is small (1-33%) red granulation within the wound bed. There is a large (67-100%) amount of necrotic tissue within the wound bed including Adherent Slough. Wound #2 status is Open. Original cause of wound was Gradually Appeared. The date acquired was: 11/26/2021. The wound is located on the Right T Third. oe The wound measures 0.9cm length x 1cm width x 0.1cm depth; 0.707cm^2 area and 0.071cm^3 volume. There is bone and Fat Layer (Subcutaneous Tissue) exposed. There is no tunneling or undermining noted. There is a none present amount of drainage noted. The wound margin is flat and intact. There is no granulation within the wound bed. There is a large (67-100%) amount of necrotic tissue within the wound bed including Eschar. Assessment Active Problems ICD-10 Type 2 diabetes mellitus with diabetic polyneuropathy Chronic kidney disease, stage 3b Peripheral vascular disease, unspecified Atherosclerotic heart disease of native coronary artery without angina pectoris Essential (primary) hypertension Other chronic pancreatitis Type 2 diabetes mellitus with foot ulcer Disruption of external operation (surgical) wound, not elsewhere classified, sequela Non-pressure chronic ulcer of other part of right foot with necrosis of bone 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  involvement without evidence of necrosis Other acute osteomyelitis, right ankle and foot Procedures Wound #1 Pre-procedure diagnosis of Wound #1 is a Diabetic Wound/Ulcer of the Lower Extremity located on the Right Amputation Site - T .Severity  of Tissue Pre oe Debridement is: Necrosis of bone. There was a Excisional Skin/Subcutaneous Tissue Debridement with a total area of 4.2 sq cm performed by Fredirick Maudlin, MD. With the following instrument(s): Curette, Forceps, and Scissors to remove Viable and Non-Viable tissue/material. Material removed includes Subcutaneous Tissue and Slough and after achieving pain control using Lidocaine 4% T opical Solution. No specimens were taken. A time out was conducted at 10:10, prior to the start of the procedure. A Minimum amount of bleeding was controlled with Pressure. The procedure was tolerated well with a pain level of 0 throughout and a pain level of 0 following the procedure. Post Debridement Measurements: 2.8cm length x 1.5cm width x 2.1cm depth; 6.927cm^3 volume. Character of Wound/Ulcer Post Debridement requires further debridement. Severity of Tissue Post Debridement is: Necrosis of bone. Post procedure Diagnosis Wound #1: Same as Pre-Procedure General Notes: scribed by Baruch Gouty, RN for Dr. Celine Ahr. Wound #2 Pre-procedure diagnosis of Wound #2 is a Diabetic Wound/Ulcer of the Lower Extremity located on the Right T Third .Severity of Tissue Pre Debridement is: oe Necrosis of bone. There was a Excisional Skin/Subcutaneous Tissue Debridement with a total area of 0.9 sq cm performed by Fredirick Maudlin, MD. With the following instrument(s): Curette to remove Viable and Non-Viable tissue/material. Material removed includes Eschar, Subcutaneous Tissue, and Slough after achieving pain control using Lidocaine 4% Topical Solution. No specimens were taken. A time out was conducted at 10:10, prior to the start of the procedure. A Minimum amount of bleeding was controlled with Pressure. The procedure was tolerated well with a pain level of 0 throughout and a pain level of 0 following the procedure. Post Debridement Measurements: 0.9cm length x 0.1cm width x 0.2cm depth; 0.014cm^3 volume. Character  of Wound/Ulcer Post Debridement is improved. Severity of Tissue Post Debridement is: Necrosis of bone. Post procedure Diagnosis Wound #2: Same as Pre-Procedure General Notes: scribed by Baruch Gouty, RN for Dr. Celine Ahr. Plan Follow-up Appointments: Return Appointment in 1 week. - Dr. Celine Ahr RM 1 with Vaughan Basta Anesthetic: Wound #1 Right Amputation Site - T oe: (In clinic) Topical Lidocaine 4% applied to wound bed Wound #2 Right T Third: oe (In clinic) Topical Lidocaine 4% applied to wound bed Bathing/ Shower/ Hygiene: Other Bathing/Shower/Hygiene Orders/Instructions: - may wash feet with soap and water with dressing changes Off-Loading: Heel suspension boot to: - podus boots bilateral feet The following medication(s) was prescribed: lidocaine topical 4 % cream cream topical for prior to debridement was prescribed at facility WOUND #1: - Amputation Site - T oe Wound Laterality: Right Cleanser: Soap and Water 1 x Per Day/30 Days Discharge Instructions: May shower and wash wound with dial antibacterial soap and water prior to dressing change. Cleanser: Wound Cleanser 1 x Per Day/30 Days Discharge Instructions: Cleanse the wound with wound cleanser prior to applying a clean dressing using gauze sponges, not tissue or cotton balls. Peri-Wound Care: Sween Lotion (Moisturizing lotion) 1 x Per Day/30 Days Discharge Instructions: Apply moisturizing lotion to both feet with dressing changes Prim Dressing: KerraCel Ag Gelling Fiber Dressing, 2x2 in (silver alginate) 1 x Per Day/30 Days ary Discharge Instructions: Apply silver alginate to wound bed pack lightly into wound bed Secondary Dressing: Woven Gauze Sponge, Non-Sterile 4x4 in 1 x Per Day/30 Days Discharge Instructions: Apply  over primary dressing as directed. Secured With: [employer], 4.5x3.1 (in/yd) 1 x Per Day/30 Days Discharge Instructions: Secure with Kerlix as directed. Secured With: 67M Medipore H Soft Cloth Surgical T ape, 4 x  10 (in/yd) 1 x Per Day/30 Days Discharge Instructions: Secure with tape as directed. WOUND #2: - T Third Wound Laterality: Right oe Cleanser: Soap and Water 1 x Per Day/30 Days Discharge Instructions: May shower and wash wound with dial antibacterial soap and water prior to dressing change. Cleanser: Wound Cleanser 1 x Per Day/30 Days Discharge Instructions: Cleanse the wound with wound cleanser prior to applying a clean dressing using gauze sponges, not tissue or cotton balls. Peri-Wound Care: Sween Lotion (Moisturizing lotion) 1 x Per Day/30 Days Discharge Instructions: Apply moisturizing lotion to both feet with dressing changes Prim Dressing: KerraCel Ag Gelling Fiber Dressing, 2x2 in (silver alginate) 1 x Per Day/30 Days ary Discharge Instructions: Apply silver alginate to wound bed Secondary Dressing: Woven Gauze Sponge, Non-Sterile 4x4 in 1 x Per Day/30 Days Discharge Instructions: Apply over primary dressing as directed. Secured With: [employer], 4.5x3.1 (in/yd) 1 x Per Day/30 Days Discharge Instructions: Secure with Kerlix as directed. Secured With: 67M Medipore H Soft Cloth Surgical T ape, 4 x 10 (in/yd) 1 x Per Day/30 Days Discharge Instructions: Secure with tape as directed. 12/19/2021: This is an 87 year old man with diabetes and peripheral vascular disease. He is here today for ongoing management of diabetic foot ulcers (both Wagner grade 3). On exam, there is a recent right second toe amputation site. The Prolene sutures are gaping and the wound has a fairly deep cavity; it does probe to bone. There is no purulent drainage or odor, but there is quite a bit of fat necrosis present. On the lateral aspect of his third toe, there is an ulcer that has a leathery eschar overlying it. Underneath, bone and tendon are exposed. No purulent drainage or malodor from this wound, either. I used a curette to debride the eschar and nonviable subcutaneous tissue off of the lateral right  third toe. This revealed exposed bone and tendon, but no evidence of frank necrosis, despite MRI findings. I used a combination of curette, forceps, and scissors to debride the amputation site. As the Prolene sutures were no longer approximating any tissue, I removed them. I removed necrotic fat from the wound. We will pack both sites with silver alginate. He should continue his prescribed course of oral antibiotics per infectious disease. Follow-up in 1 week. Electronic Signature(s) Signed: 12/19/2021 12:25:45 PM By: Fredirick Maudlin MD FACS Signed: 12/19/2021 4:55:43 PM By: Baruch Gouty RN, BSN Previous Signature: 12/19/2021 11:23:33 AM Version By: Fredirick Maudlin MD FACS Entered By: Baruch Gouty on 12/19/2021 11:53:00 -------------------------------------------------------------------------------- HxROS Details Patient Name: Date of Service: Justin Staggers T. 12/19/2021 9:00 A M Medical Record Number: [id number] Patient Account Number: [id number] Date of Birth/Sex: Treating RN: [birth year] (87 y.o. [name] Primary Care Provider: [facility] [place] Other Clinician: Referring Provider: Treating Provider/Extender: Betsy Pries in Treatment: 0 Information Obtained From Patient Chart Constitutional Symptoms (General Health) Complaints and Symptoms: Negative for: Fatigue; Fever; Chills; Marked Weight Change Eyes Complaints and Symptoms: Negative for: Dry Eyes; Vision Changes; Glasses / Contacts Medical History: Negative for: Cataracts; Glaucoma; Optic Neuritis Ear/Nose/Mouth/Throat Complaints and Symptoms: Negative for: Chronic sinus problems or rhinitis Respiratory Complaints and Symptoms: Negative for: Chronic or frequent coughs; Shortness of Breath Cardiovascular Complaints and Symptoms: Negative for: Chest pain Medical History: Positive for: Coronary  Artery Disease; Hypertension; Peripheral Arterial Disease Gastrointestinal Complaints  and Symptoms: Negative for: Frequent diarrhea; Nausea; Vomiting Medical History: Past Medical History Notes: chronic pancreatitis Endocrine Complaints and Symptoms: Negative for: Heat/cold intolerance Medical History: Positive for: Type II Diabetes Negative for: Type I Diabetes Treated with: Insulin Blood sugar tested every day: Yes Tested : 2 times per day Genitourinary Complaints and Symptoms: Negative for: Frequent urination Medical History: Negative for: End Stage Renal Disease Past Medical History Notes: CKD st 3, testosterone deficiency Integumentary (Skin) Complaints and Symptoms: Positive for: Wounds - right foot Medical History: Negative for: History of Burn Musculoskeletal Complaints and Symptoms: Positive for: Muscle Weakness Medical History: Positive for: Osteoarthritis Neurologic Complaints and Symptoms: Negative for: Numbness/parasthesias Medical History: Negative for: Neuropathy Past Medical History Notes: CVA Psychiatric Complaints and Symptoms: Negative for: Claustrophobia; Suicidal Medical History: Positive for: Anorexia/bulimia - poor apetite Negative for: Confinement Anxiety Hematologic/Lymphatic Medical History: Positive for: Anemia Immunological Oncologic Medical History: Negative for: Received Chemotherapy; Received Radiation Immunizations Pneumococcal Vaccine: Received Pneumococcal Vaccination: Yes Received Pneumococcal Vaccination On or After 60th Birthday: Yes Implantable Devices No devices added Hospitalization / Surgery History Type of Hospitalization/Surgery right 2nd toe amputation 11/26/21 [facility] peripheral balloon angioplasty 11/24/21 temporal artery biopsy back surgery neck surgery cholecystectomy Family and Social History Cancer: No; Diabetes: Yes; Heart Disease: Yes - Mother; Hereditary Spherocytosis: No; Hypertension: Yes - Mother,Siblings; Kidney Disease: No; Lung Disease: No; Seizures: No; Stroke: No; Thyroid  Problems: No; Tuberculosis: No; Former smoker; Marital Status - Married; Alcohol Use: Never; Drug Use: No History; Caffeine Use: Daily - coffee; Financial Concerns: No; Food, Clothing or Shelter Needs: No; Support System Lacking: No; Transportation Concerns: No Electronic Signature(s) Signed: 12/19/2021 12:25:45 PM By: Fredirick Maudlin MD FACS Signed: 12/19/2021 4:55:43 PM By: Baruch Gouty RN, BSN Entered By: Baruch Gouty on 12/19/2021 09:46:12 -------------------------------------------------------------------------------- Uriah Details Patient Name: Date of Service: Justin Staggers T. 12/19/2021 Medical Record Number: [id number] Patient Account Number: [id number] Date of Birth/Sex: Treating RN: Aug 16, 1934 (87 y.o. M) Primary Care Provider: [facility] [place] Other Clinician: Referring Provider: Treating Provider/Extender: Betsy Pries in Treatment: 0 Diagnosis Coding ICD-10 Codes Code Description E11.42 Type 2 diabetes mellitus with diabetic polyneuropathy N18.32 Chronic kidney disease, stage 3b I73.9 Peripheral vascular disease, unspecified I25.10 Atherosclerotic heart disease of native coronary artery without angina pectoris I10 Essential (primary) hypertension K86.1 Other chronic pancreatitis E11.621 Type 2 diabetes mellitus with foot ulcer T81.31XS Disruption of external operation (surgical) wound, not elsewhere classified, sequela 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 of necrosis M86.171 Other acute osteomyelitis, right ankle and foot Facility Procedures CPT4 Code: [id number] Description: 99213 - WOUND CARE VISIT-LEV 3 EST PT Modifier: 25 Quantity: 1 CPT4 Code: [id number] Description: 11042 - DEB SUBQ TISSUE 20 SQ CM/< ICD-10 Diagnosis Description L97.516 Non-pressure chronic ulcer of other part  of right foot with bone involvement wit L97.514 Non-pressure chronic ulcer of other part of right foot with necrosis of bone Modifier: hout evidence of nec Quantity: 1 rosis Physician Procedures : CPT4 Code Description Modifier [id number] 98921 - WC PHYS LEVEL 4 - NEW PT 25 ICD-10 Diagnosis Description L97.514 Non-pressure chronic ulcer of other part of right foot with necrosis of bone L97.516 Non-pressure chronic ulcer of other part of right foot  with bone involvement without evidence of nec T81.31XS Disruption of external operation (surgical) wound, not elsewhere classified, sequela E11.621 Type  2 diabetes mellitus with foot ulcer Quantity: 1 rosis : [id number] - WC PHYS SUBQ TISS 20 SQ CM ICD-10 Diagnosis Description L97.516 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

## 2021-12-19 NOTE — Progress Notes (Signed)
Justin [name] ([id number]) . Visit Report for 12/19/2021 Allergy List Details Patient Name: Date of Service: Justin [name], Justin [name] 12/19/2021 9:00 A M Medical Record Number: [id number] Patient Account Number: [id number] Date of Birth/Sex: Treating RN: December 26, 1934 (87 y.o. [name] Primary Care Bassy Fetterly: [place] Other Clinician: Referring Lorren Splawn: Treating Jhordyn Hoopingarner/Extender: Betsy Pries in Treatment: 0 Allergies Active Allergies No Known Allergies Allergy Notes Electronic Signature(s) Signed: 12/19/2021 4:55:43 PM By: Baruch Gouty RN, BSN Entered By: Baruch Gouty on 12/19/2021 09:23:29 -------------------------------------------------------------------------------- Arrival Information Details Patient Name: Date of Service: Justin Staggers [name]. 12/19/2021 9:00 A M Medical Record Number: [id number] Patient Account Number: [id number] Date of Birth/Sex: Treating RN: 08-01-1934 (87 y.o. [name] Primary Care Burech Mcfarland: [place] Other Clinician: Referring Shaney Deckman: Treating Maccoy Haubner/Extender: Betsy Pries in Treatment: 0 Visit Information Patient Arrived: Wheel Chair Arrival Time: 09:11 Accompanied By: grandgaughter Transfer Assistance: Manual Patient Identification Verified: Yes Secondary Verification Process Completed: Yes Patient Requires Transmission-Based Precautions: No Patient Has Alerts: Yes Patient Alerts: R ABI =.75, TBI= .34 L ABI= .86, TBI= 1.02 Electronic Signature(s) Signed: 12/19/2021 4:55:43 PM By: Baruch Gouty RN, BSN Entered By: Baruch Gouty on 12/19/2021 16:16:49 -------------------------------------------------------------------------------- Clinic Level of Care Assessment Details Patient Name: Date of Service: Justin Grant Fontana [name]. 12/19/2021 9:00 A M Medical Record Number: [id number] Patient Account Number: [id number] Date of Birth/Sex: Treating RN: [birth year]  (87 y.o. [name] Primary Care Sereen Schaff: [place] Other Clinician: Referring Talal Fritchman: Treating Ashaya Raftery/Extender: Betsy Pries in Treatment: 0 Clinic Level of Care Assessment Items TOOL 1 Quantity Score '[]'$  - 0 Use when EandM and Procedure is performed on INITIAL visit ASSESSMENTS - Nursing Assessment / Reassessment X- 1 20 General Physical Exam (combine w/ comprehensive assessment (listed just below) when performed on new pt. evals) X- 1 25 Comprehensive Assessment (HX, ROS, Risk Assessments, Wounds Hx, etc.) ASSESSMENTS - Wound and Skin Assessment / Reassessment '[]'$  - 0 Dermatologic / Skin Assessment (not related to wound area) ASSESSMENTS - Ostomy and/or Continence Assessment and Care '[]'$  - 0 Incontinence Assessment and Management '[]'$  - 0 Ostomy Care Assessment and Management (repouching, etc.) PROCESS - Coordination of Care X - Simple Patient / Family Education for ongoing care 1 15 '[]'$  - 0 Complex (extensive) Patient / Family Education for ongoing care X- 1 10 Staff obtains Programmer, systems, Records, [name] Results / Process Orders est X- 1 10 Staff telephones HHA, Nursing Homes / Clarify orders / etc '[]'$  - 0 Routine Transfer to another Facility (non-emergent condition) '[]'$  - 0 Routine Hospital Admission (non-emergent condition) X- 1 15 New Admissions / Biomedical engineer / Ordering NPWT Apligraf, etc. , '[]'$  - 0 Emergency Hospital Admission (emergent condition) PROCESS - Special Needs '[]'$  - 0 Pediatric / Minor Patient Management '[]'$  - 0 Isolation Patient Management '[]'$  - 0 Hearing / Language / Visual special needs '[]'$  - 0 Assessment of Community assistance (transportation, D/C planning, etc.) '[]'$  - 0 Additional assistance / Altered mentation '[]'$  - 0 Support Surface(s) Assessment (bed, cushion, seat, etc.) INTERVENTIONS - Miscellaneous '[]'$  - 0 External ear exam '[]'$  - 0 Patient Transfer (multiple staff / Civil Service fast streamer / Similar  devices) '[]'$  - 0 Simple Staple / Suture removal (25 or less) '[]'$  - 0 Complex Staple / Suture removal (26 or more) '[]'$  - 0 Hypo/Hyperglycemic Management (do not check if billed separately) '[]'$  - 0 Ankle / Brachial Index (ABI) - do not check if billed separately Has the patient  been seen at the hospital within the last three years: Yes Total Score: 95 Level Of Care: New/Established - Level 3 Electronic Signature(s) Signed: 12/19/2021 4:55:43 PM By: Baruch Gouty RN, BSN Entered By: Baruch Gouty on 12/19/2021 11:51:38 -------------------------------------------------------------------------------- Encounter Discharge Information Details Patient Name: Date of Service: Justin Staggers [name]. 12/19/2021 9:00 A M Medical Record Number: [id number] Patient Account Number: [id number] Date of Birth/Sex: Treating RN: 10-06-1934 (87 y.o. [name] Primary Care Donicia Druck: [place] Other Clinician: Referring Login Muckleroy: Treating Naftoli Penny/Extender: Betsy Pries in Treatment: 0 Encounter Discharge Information Items Post Procedure Vitals Discharge Condition: Stable Temperature (F): 97.9 Ambulatory Status: Wheelchair Pulse (bpm): 86 Discharge Destination: [place] Respiratory Rate (breaths/min): 18 Telephoned: No Blood Pressure (mmHg): 136/66 Orders Sent: Yes Transportation: Other Accompanied By: granddaughter Schedule Follow-up Appointment: Yes Clinical Summary of Care: Patient Declined Notes facility transportation Electronic Signature(s) Signed: 12/19/2021 4:55:43 PM By: Baruch Gouty RN, BSN Entered By: Baruch Gouty on 12/19/2021 10:41:51 -------------------------------------------------------------------------------- Lower Extremity Assessment Details Patient Name: Date of Service: Justin Grant Fontana [name]. 12/19/2021 9:00 A M Medical Record Number: [id number] Patient Account Number: [id number] Date of Birth/Sex: Treating  RN: 01-13-1935 (87 y.o. [name] Primary Care Ayomide Purdy: [place] Other Clinician: Referring Lenzy Kerschner: Treating Krishang Reading/Extender: Betsy Pries in Treatment: 0 Edema Assessment Assessed: [Left: No] [Right: No] Edema: [Left: N] [Right: o] Calf Left: Right: Point of Measurement: From Medial Instep 31 cm Ankle Left: Right: Point of Measurement: From Medial Instep 20.5 cm Vascular Assessment Pulses: Dorsalis Pedis Palpable: [Right:No] Electronic Signature(s) Signed: 12/19/2021 4:55:43 PM By: Baruch Gouty RN, BSN Entered By: Baruch Gouty on 12/19/2021 09:54:40 -------------------------------------------------------------------------------- Multi Wound Chart Details Patient Name: Date of Service: Justin Staggers [name]. 12/19/2021 9:00 A M Medical Record Number: [id number] Patient Account Number: [id number] Date of Birth/Sex: Treating RN: 1935-02-02 (87 y.o. M) Primary Care Cozetta Seif: [place] Other Clinician: Referring Clare Casto: Treating Tarisha Fader/Extender: Betsy Pries in Treatment: 0 Vital Signs Height(in): 72 Capillary Blood Glucose(mg/dl): 123 Weight(lbs): 146 Pulse(bpm): 42 Body Mass Index(BMI): 19.8 Blood Pressure(mmHg): 136/66 Temperature(F): 97.9 Respiratory Rate(breaths/min): 18 Photos: [N/A:N/A] Right Amputation Site - [name] Right [name] N/A Wound Location: Surgical Injury Gradually Appeared N/A Wounding Event: Diabetic Wound/Ulcer of the Lower Diabetic Wound/Ulcer of the Lower N/A Primary Etiology: Extremity Extremity Open Surgical Wound Arterial Insufficiency Ulcer N/A Secondary Etiology: Anemia, Coronary Artery Disease, Anemia, Coronary Artery Disease, N/A Comorbid History: Hypertension, Peripheral Arterial Hypertension, Peripheral Arterial Disease, Type II Diabetes, Disease, Type II Diabetes, Osteoarthritis, Anorexia/bulimia Osteoarthritis, Anorexia/bulimia 11/26/2021  11/26/2021 N/A Date Acquired: 0 0 N/A Weeks of Treatment: Open Open N/A Wound Status: No No N/A Wound Recurrence: 2.8x1.5x2.1 0.9x1x0.1 N/A Measurements L x W x D (cm) 3.299 0.707 N/A A (cm) : rea 6.927 0.071 N/A Volume (cm) : 0.00% 0.00% N/A % Reduction in A rea: 0.00% 0.00% N/A % Reduction in Volume: Grade 3 Grade 3 N/A Classification: MRI MRI N/A Earleen Newport Verification: Medium None Present N/A Exudate A mount: Serosanguineous N/A N/A Exudate Type: red, brown N/A N/A Exudate Color: Distinct, outline attached Flat and Intact N/A Wound Margin: Small (1-33%) None Present (0%) N/A Granulation A mount: Red N/A N/A Granulation Quality: Large (67-100%) Large (67-100%) N/A Necrotic A mount: Adherent Slough Eschar N/A Necrotic Tissue: Fat Layer (Subcutaneous Tissue): Yes Fat Layer (Subcutaneous Tissue): Yes N/A Exposed Structures: Bone: Yes Bone: Yes Fascia: No Fascia: No Tendon: No Tendon: No Muscle: No Muscle: No Joint: No Joint: No Small (  1-33%) None N/A Epithelialization: Debridement - Excisional Debridement - Excisional N/A Debridement: Pre-procedure Verification/Time Out 10:10 10:10 N/A Taken: Lidocaine 4% Topical Solution Lidocaine 4% Topical Solution N/A Pain Control: Subcutaneous, Slough Necrotic/Eschar, Subcutaneous, N/A Tissue Debrided: Slough Skin/Subcutaneous Tissue Skin/Subcutaneous Tissue N/A Level: 4.2 0.9 N/A Debridement A (sq cm): rea Curette, Forceps, Scissors Curette N/A Instrument: Minimum Minimum N/A Bleeding: Pressure Pressure N/A Hemostasis Achieved: 0 0 N/A Procedural Pain: 0 0 N/A Post Procedural Pain: Procedure was tolerated well Procedure was tolerated well N/A Debridement Treatment Response: 2.8x1.5x2.1 0.9x0.1x0.2 N/A Post Debridement Measurements L x W x D (cm) 6.927 0.014 N/A Post Debridement Volume: (cm) Debridement Debridement N/A Procedures Performed: Treatment Notes Wound #1 (Amputation Site - Toe)  Wound Laterality: Right Cleanser Soap and Water Discharge Instruction: May shower and wash wound with dial antibacterial soap and water prior to dressing change. Wound Cleanser Discharge Instruction: Cleanse the wound with wound cleanser prior to applying a clean dressing using gauze sponges, not tissue or cotton balls. Peri-Wound Care Sween Lotion (Moisturizing lotion) Discharge Instruction: Apply moisturizing lotion to both feet with dressing changes Topical Primary Dressing KerraCel Ag Gelling Fiber Dressing, 2x2 in (silver alginate) Discharge Instruction: Apply silver alginate to wound bed pack lightly into wound bed Secondary Dressing Woven Gauze Sponge, Non-Sterile 4x4 in Discharge Instruction: Apply over primary dressing as directed. Secured With [employer], 4.5x3.1 (in/yd) Discharge Instruction: Secure with Kerlix as directed. 10M Medipore H Soft Cloth Surgical [name] ape, 4 x 10 (in/yd) Discharge Instruction: Secure with tape as directed. Compression Wrap Compression Stockings Add-Ons Wound #2 (Toe Third) Wound Laterality: Right Cleanser Soap and Water Discharge Instruction: May shower and wash wound with dial antibacterial soap and water prior to dressing change. Wound Cleanser Discharge Instruction: Cleanse the wound with wound cleanser prior to applying a clean dressing using gauze sponges, not tissue or cotton balls. Peri-Wound Care Sween Lotion (Moisturizing lotion) Discharge Instruction: Apply moisturizing lotion to both feet with dressing changes Topical Primary Dressing KerraCel Ag Gelling Fiber Dressing, 2x2 in (silver alginate) Discharge Instruction: Apply silver alginate to wound bed Secondary Dressing Woven Gauze Sponge, Non-Sterile 4x4 in Discharge Instruction: Apply over primary dressing as directed. Secured With [employer], 4.5x3.1 (in/yd) Discharge Instruction: Secure with Kerlix as directed. 10M Medipore H Soft Cloth Surgical [name] ape, 4 x  10 (in/yd) Discharge Instruction: Secure with tape as directed. Compression Wrap Compression Stockings Add-Ons Electronic Signature(s) Signed: 12/19/2021 10:43:55 AM By: Fredirick Maudlin MD FACS Entered By: Fredirick Maudlin on 12/19/2021 10:43:55 -------------------------------------------------------------------------------- Multi-Disciplinary Care Plan Details Patient Name: Date of Service: Justin Staggers [name]. 12/19/2021 9:00 A M Medical Record Number: [id number] Patient Account Number: [id number] Date of Birth/Sex: Treating RN: 1935/02/04 (87 y.o. [name] Primary Care Brittin Belnap: [place] Other Clinician: Referring Drake Wuertz: Treating Nazyia Gaugh/Extender: Betsy Pries in Treatment: 0 Multidisciplinary Care Plan reviewed with physician Active Inactive Nutrition Nursing Diagnoses: Impaired glucose control: actual or potential Potential for alteratiion in Nutrition/Potential for imbalanced nutrition Goals: Patient/caregiver will maintain therapeutic glucose control Date Initiated: 12/19/2021 Target Resolution Date: 01/16/2022 Goal Status: Active Interventions: Assess HgA1c results as ordered upon admission and as needed Assess patient nutrition upon admission and as needed per policy Provide education on elevated blood sugars and impact on wound healing Treatment Activities: Patient referred to Primary Care Physician for further nutritional evaluation : 12/19/2021 Notes: Osteomyelitis Nursing Diagnoses: Infection: osteomyelitis Knowledge deficit related to disease process and management Goals: Patient/caregiver will verbalize understanding of disease process and disease management Date  Initiated: 12/19/2021 Target Resolution Date: 01/16/2022 Goal Status: Active Patient's osteomyelitis will resolve Date Initiated: 12/19/2021 Target Resolution Date: 01/16/2022 Goal Status: Active Interventions: Assess for signs and symptoms of  osteomyelitis resolution every visit Provide education on osteomyelitis Treatment Activities: Consult for HBO : 12/19/2021 Surgical debridement : 12/19/2021 Systemic antibiotics : 12/19/2021 [name] ordered outside of clinic : 12/19/2021 est Notes: Wound/Skin Impairment Nursing Diagnoses: Impaired tissue integrity Knowledge deficit related to ulceration/compromised skin integrity Goals: Patient/caregiver will verbalize understanding of skin care regimen Date Initiated: 12/19/2021 Target Resolution Date: 01/16/2022 Goal Status: Active Ulcer/skin breakdown will have a volume reduction of 30% by week 4 Date Initiated: 12/19/2021 Target Resolution Date: 01/16/2022 Goal Status: Active Interventions: Assess patient/caregiver ability to obtain necessary supplies Assess patient/caregiver ability to perform ulcer/skin care regimen upon admission and as needed Assess ulceration(s) every visit Provide education on ulcer and skin care Treatment Activities: Skin care regimen initiated : 12/19/2021 Topical wound management initiated : 12/19/2021 Notes: Electronic Signature(s) Signed: 12/19/2021 4:55:43 PM By: Baruch Gouty RN, BSN Entered By: Baruch Gouty on 12/19/2021 10:15:48 -------------------------------------------------------------------------------- Pain Assessment Details Patient Name: Date of Service: Justin Staggers [name]. 12/19/2021 9:00 A M Medical Record Number: [id number] Patient Account Number: [id number] Date of Birth/Sex: Treating RN: 1934/08/15 (87 y.o. [name] Primary Care Jermichael Belmares: [place] Other Clinician: Referring Juliya Magill: Treating Eagan Shifflett/Extender: Betsy Pries in Treatment: 0 Active Problems Location of Pain Severity and Description of Pain Patient Has Paino No Site Locations Rate the pain. Current Pain Level: 0 Worst Pain Level: 4 Least Pain Level: 0 Character of Pain Describe the Pain: Aching Pain Management and  Medication Current Pain Management: Is the Current Pain Management Adequate: Adequate How does your wound impact your activities of daily livingo Sleep: No Bathing: No Appetite: No Relationship With Others: No Bladder Continence: No Emotions: No Bowel Continence: No Work: No Toileting: No Drive: No Dressing: No Hobbies: No Electronic Signature(s) Signed: 12/19/2021 4:55:43 PM By: Baruch Gouty RN, BSN Entered By: Baruch Gouty on 12/19/2021 09:59:05 -------------------------------------------------------------------------------- Patient/Caregiver Education Details Patient Name: Date of Service: Justin Frederich Balding 9/12/2023andnbsp9:00 [place] Record Number: [id number] Patient Account Number: [id number] Date of Birth/Gender: Treating RN: 1934/09/15 (87 y.o. [name] Primary Care Physician: [place] Other Clinician: Referring Physician: Treating Physician/Extender: Betsy Pries in Treatment: 0 Education Assessment Education Provided To: Patient Education Topics Provided Elevated Blood Sugar/ Impact on Healing: Handouts: Elevated Blood Sugars: How Do They Affect Wound Healing Methods: Explain/Verbal, Printed Responses: Reinforcements needed, State content correctly Infection: Methods: Explain/Verbal Responses: Reinforcements needed, State content correctly [place]: o Handouts: Welcome [name] [facility] [place] o Methods: Explain/Verbal, Printed Responses: Reinforcements needed, State content correctly Wound/Skin Impairment: Handouts: Caring for Your Ulcer, Skin Care Do's and Dont's Methods: Explain/Verbal, Printed Responses: Reinforcements needed, State content correctly Electronic Signature(s) Signed: 12/19/2021 4:55:43 PM By: Baruch Gouty RN, BSN Entered By: Baruch Gouty on 12/19/2021 10:16:37 -------------------------------------------------------------------------------- Wound  Assessment Details Patient Name: Date of Service: Justin Staggers [name]. 12/19/2021 9:00 A M Medical Record Number: [id number] Patient Account Number: [id number] Date of Birth/Sex: Treating RN: Dec 16, 1934 (87 y.o. [name] Primary Care Zaul Hubers: [place] Other Clinician: Referring Yadiel Aubry: Treating Jann Milkovich/Extender: Betsy Pries in Treatment: 0 Wound Status Wound Number: 1 Primary Diabetic Wound/Ulcer of the Lower Extremity Etiology: Wound Location: Right Amputation Site - Toe Secondary Open Surgical Wound Wounding Event: Surgical Injury Etiology: Date Acquired: 11/26/2021 Wound Open  Weeks Of Treatment: 0 Status: Clustered Wound: No Comorbid Anemia, Coronary Artery Disease, Hypertension, Peripheral History: Arterial Disease, Type II Diabetes, Osteoarthritis, Anorexia/bulimia Photos Wound Measurements Length: (cm) 2.8 Width: (cm) 1.5 Depth: (cm) 2.1 Area: (cm) 3.299 Volume: (cm) 6.927 % Reduction in Area: 0% % Reduction in Volume: 0% Epithelialization: Small (1-33%) Tunneling: No Undermining: No Wound Description Classification: Grade 3 Wagner Verification: MRI Wound Margin: Distinct, outline attached Exudate Amount: Medium Exudate Type: Serosanguineous Exudate Color: red, brown Foul Odor After Cleansing: No Slough/Fibrino Yes Wound Bed Granulation Amount: Small (1-33%) Exposed Structure Granulation Quality: Red Fascia Exposed: No Necrotic Amount: Large (67-100%) Fat Layer (Subcutaneous Tissue) Exposed: Yes Necrotic Quality: Adherent Slough Tendon Exposed: No Muscle Exposed: No Joint Exposed: No Bone Exposed: Yes Treatment Notes Wound #1 (Amputation Site - Toe) Wound Laterality: Right Cleanser Soap and Water Discharge Instruction: May shower and wash wound with dial antibacterial soap and water prior to dressing change. Wound Cleanser Discharge Instruction: Cleanse the wound with wound cleanser prior to  applying a clean dressing using gauze sponges, not tissue or cotton balls. Peri-Wound Care Sween Lotion (Moisturizing lotion) Discharge Instruction: Apply moisturizing lotion to both feet with dressing changes Topical Primary Dressing KerraCel Ag Gelling Fiber Dressing, 2x2 in (silver alginate) Discharge Instruction: Apply silver alginate to wound bed pack lightly into wound bed Secondary Dressing Woven Gauze Sponge, Non-Sterile 4x4 in Discharge Instruction: Apply over primary dressing as directed. Secured With [employer], 4.5x3.1 (in/yd) Discharge Instruction: Secure with Kerlix as directed. 8M Medipore H Soft Cloth Surgical [name] ape, 4 x 10 (in/yd) Discharge Instruction: Secure with tape as directed. Compression Wrap Compression Stockings Add-Ons Electronic Signature(s) Signed: 12/19/2021 4:55:43 PM By: Baruch Gouty RN, BSN Entered By: Baruch Gouty on 12/19/2021 10:17:20 -------------------------------------------------------------------------------- Wound Assessment Details Patient Name: Date of Service: Justin Staggers [name]. 12/19/2021 9:00 A M Medical Record Number: [id number] Patient Account Number: [id number] Date of Birth/Sex: Treating RN: 1934-12-24 (86 y.o. [name] Primary Care Anuel Sitter: [place] Other Clinician: Referring Estella Malatesta: Treating Dorianna Mckiver/Extender: Betsy Pries in Treatment: 0 Wound Status Wound Number: 2 Primary Diabetic Wound/Ulcer of the Lower Extremity Etiology: Wound Location: Right [name] Secondary Arterial Insufficiency Ulcer Wounding Event: Gradually Appeared Etiology: Date Acquired: 11/26/2021 Wound Open Weeks Of Treatment: 0 Status: Clustered Wound: No Comorbid Anemia, Coronary Artery Disease, Hypertension, Peripheral History: Arterial Disease, Type II Diabetes, Osteoarthritis, Anorexia/bulimia Photos Wound Measurements Length: (cm) 0.9 Width: (cm) 1 Depth: (cm) 0.1 Area:  (cm) 0.707 Volume: (cm) 0.071 % Reduction in Area: 0% % Reduction in Volume: 0% Epithelialization: None Tunneling: No Undermining: No Wound Description Classification: Grade 3 Wagner Verification: MRI Wound Margin: Flat and Intact Exudate Amount: None Present Foul Odor After Cleansing: No Slough/Fibrino No Wound Bed Granulation Amount: None Present (0%) Exposed Structure Necrotic Amount: Large (67-100%) Fascia Exposed: No Necrotic Quality: Eschar Fat Layer (Subcutaneous Tissue) Exposed: Yes Tendon Exposed: No Muscle Exposed: No Joint Exposed: No Bone Exposed: Yes Treatment Notes Wound #2 (Toe Third) Wound Laterality: Right Cleanser Soap and Water Discharge Instruction: May shower and wash wound with dial antibacterial soap and water prior to dressing change. Wound Cleanser Discharge Instruction: Cleanse the wound with wound cleanser prior to applying a clean dressing using gauze sponges, not tissue or cotton balls. Peri-Wound Care Sween Lotion (Moisturizing lotion) Discharge Instruction: Apply moisturizing lotion to both feet with dressing changes Topical Primary Dressing KerraCel Ag Gelling Fiber Dressing, 2x2 in (silver alginate) Discharge Instruction: Apply silver alginate to wound bed Secondary Dressing  Woven Gauze Sponge, Non-Sterile 4x4 in Discharge Instruction: Apply over primary dressing as directed. Secured With [employer], 4.5x3.1 (in/yd) Discharge Instruction: Secure with Kerlix as directed. 37M Medipore H Soft Cloth Surgical [name] ape, 4 x 10 (in/yd) Discharge Instruction: Secure with tape as directed. Compression Wrap Compression Stockings Add-Ons Electronic Signature(s) Signed: 12/19/2021 4:55:43 PM By: Baruch Gouty RN, BSN Entered By: Baruch Gouty on 12/19/2021 10:17:35 -------------------------------------------------------------------------------- Vitals Details Patient Name: Date of Service: Justin Staggers [name]. 12/19/2021 9:00 A  M Medical Record Number: [id number] Patient Account Number: [id number] Date of Birth/Sex: Treating RN: July 28, 1934 (87 y.o. [name] Primary Care Taz Vanness: [place] Other Clinician: Referring Shritha Bresee: Treating Tanda Morrissey/Extender: Betsy Pries in Treatment: 0 Vital Signs Time Taken: 09:21 Temperature (F): 97.9 Height (in): 72 Pulse (bpm): 86 Weight (lbs): 146 Respiratory Rate (breaths/min): 18 Body Mass Index (BMI): 19.8 Blood Pressure (mmHg): 136/66 Capillary Blood Glucose (mg/dl): 123 Reference Range: 80 - 120 mg / dl Notes glucose per facility record yesterday evening Electronic Signature(s) Signed: 12/19/2021 4:55:43 PM By: Baruch Gouty RN, BSN Entered By: Baruch Gouty on 12/19/2021 09:23:11

## 2021-12-20 ENCOUNTER — Ambulatory Visit (INDEPENDENT_AMBULATORY_CARE_PROVIDER_SITE_OTHER): Payer: Medicare Other | Admitting: Internal Medicine

## 2021-12-20 ENCOUNTER — Other Ambulatory Visit: Payer: Self-pay

## 2021-12-20 DIAGNOSIS — E11628 Type 2 diabetes mellitus with other skin complications: Secondary | ICD-10-CM | POA: Diagnosis present

## 2021-12-20 DIAGNOSIS — I739 Peripheral vascular disease, unspecified: Secondary | ICD-10-CM | POA: Diagnosis not present

## 2021-12-20 DIAGNOSIS — L089 Local infection of the skin and subcutaneous tissue, unspecified: Secondary | ICD-10-CM

## 2021-12-20 NOTE — Progress Notes (Signed)
[facility]  Patient Active Problem List   Diagnosis Date Noted   AKI (acute kidney injury) ([place])    Altered mental status    PAD (peripheral artery disease) ([place]) 12/09/2021   Acute encephalopathy 12/09/2021   Balanitis 12/09/2021   Pressure injury of skin 11/25/2021   Diabetic foot ([place]) 11/16/2021   Diabetic foot infection ([place]) 11/15/2021   Intertrigo 11/15/2021   History of CVA (cerebrovascular accident) 05/21/2021   Chronic kidney disease, stage 3b ([place]) 05/21/2021   Mixed diabetic hyperlipidemia associated with type 2 diabetes mellitus ([place]) 05/21/2021   Polyneuropathy due to type 2 diabetes mellitus ([place]) 05/21/2021   Leukocytosis 05/21/2021   Type 2 diabetes mellitus with stage 3b chronic kidney disease, with long-term current use of insulin ([place]) 09/29/2015   Arthritis 09/06/2015   Diabetic peripheral neuropathy ([place]) 08/17/2015   OA (osteoarthritis) of knee 08/17/2015   Primary osteoarthritis of both knees 08/11/2015   Primary osteoarthritis of left hip 08/11/2015   Hammer toe, acquired 09/30/2013   Personal history of colonic adenomas 04/15/2013   Chronic pancreatitis ([place]) 02/26/2013   Anemia of chronic disease 02/26/2013   Goals of care, counseling/discussion 02/26/2013   Essential hypertension 12/14/2012   Testosterone deficiency 10/22/2011   Headache above the eye region 02/22/2011   Coronary artery disease 02/22/2011      Subjective:    Patient ID: Justin [name], male    DOB: 02-06-1935, 87 y.o.   MRN: [id number]  Chief Complaint  Patient presents with   Hospitalization Follow-up    HPI:  Justin [name] is a 87 y.o. male  pmh DM2, HTN, PAD, stroke, CKD 3b, recent gangrene 2nd left toe s/p disarticulation, admitted from sn[facility] 9/2-9/5 to [facility] for ams, with imaging showing sign of new om in his 2nd, 57rd, and 1st ray. He is here for hospital follow up  I am unclear what the underlying reason for ams is I  suspect it is probably delirium. I do not see obvious active cellulitis in the foot and I suspect the new mri changes could be explained by ongoing dry gangrene process, but also potentially ongoing progressive OM   Ucx negative; no bcx obtained but no sepsis physiology otherwise, and ams already improving quickly     Certainly could be om changes too which would require biopsy/cx for definitive dx   But given his significant PAD I am not sure the benefit is in his favor with more local foot debridement or tma   I agree with dr Jacqualyn Posey maybe a trial of long term abx is worth while before further decision is made, with vascular input  Patient discharged on doxy/cefadroxil planned 4-6 weeks  ---- 9/13 id clinic f/u He saw wound care and some bedside debridement done for open wound at 2nd mtp disarticulation site He continues on doxy/cefadroxil at snf and no side effect No fever, chill  He is seeing both podiatry/wound clinic within the past week     No Known Allergies    Outpatient Medications Prior to Visit  Medication Sig Dispense Refill   acetaminophen (TYLENOL) 325 MG tablet Take 650 mg by mouth daily as needed (pain).     Alcohol Swabs PADS Use to check blood sugar 3 times daily. Dx: E10.9 300 each 3   aspirin 81 MG chewable tablet Chew 81 mg by mouth daily.     atorvastatin (LIPITOR) 40 MG tablet Take 40 mg by mouth at bedtime.  Blood Glucose Calibration (ACCU-CHEK AVIVA) SOLN Use to check blood sugar 3 times daily. Dx: E10.9 1 each 3   Blood Glucose Monitoring Suppl (ACCU-CHEK AVIVA PLUS) w/Device KIT Use to check blood sugar 3 times daily. Dx: E10.9 1 kit 0   cefadroxil (DURICEF) 500 MG capsule Take 2 capsules (1,000 mg total) by mouth 2 (two) times daily. 120 capsule 0   clopidogrel (PLAVIX) 75 MG tablet Take 1 tablet (75 mg total) by mouth daily with breakfast.     doxycycline (VIBRA-TABS) 100 MG tablet Take 1 tablet (100 mg total) by mouth 2 (two) times daily. 60  tablet 0   Emollient (AQUAPHOR ADV PROTECT HEALING) 41 % OINT Apply 1 application  topically daily. Mix with Lidocaine cream . Apply to feet/toes     ferrous gluconate (FERGON) 324 MG tablet Take 324 mg by mouth See admin instructions. Take 324 mg by mouth on Monday, Tuesday, Wednesday, Thursday, Friday     fluticasone furoate-vilanterol (BREO ELLIPTA) 100-25 MCG/ACT AEPB Inhale 1 puff into the lungs daily.     glucagon (GLUCAGON EMERGENCY) 1 MG injection Inject 1 mg into the muscle once as needed. (Patient taking differently: Inject 1 mg into the muscle once as needed (low blood sugar).) 1 each 12   glucose blood (ACCU-CHEK AVIVA PLUS) test strip Use to check blood sugar 3 times daily. Dx: E10.9 300 each 3   insulin glargine-yfgn (SEMGLEE) 100 UNIT/ML injection Inject 0.1 mLs (10 Units total) into the skin daily. (Patient taking differently: Inject 14 Units into the skin daily.) 10 mL 11   Insulin Pen Needle 32G X 4 MM MISC Use as directed to inject insulin tidwc 100 each 11   Ipratropium-Albuterol (COMBIVENT) 20-100 MCG/ACT AERS respimat Inhale 1 puff into the lungs every 6 (six) hours as needed for wheezing. (Patient taking differently: Inhale 1 puff into the lungs every 6 (six) hours as needed (COPD).)     isosorbide mononitrate (ISMO) 10 MG tablet Take 10 mg by mouth at bedtime.     Lancets (ACCU-CHEK SOFT TOUCH) lancets Use to check blood sugar 3 times daily. Dx: E10.9 300 each 3   lidocaine (LMX) 4 % cream Apply 1 application  topically daily. Apply generously to right lower leg and heels daily-pain.     loratadine (CLARITIN) 10 MG tablet Take 10 mg by mouth daily.     NOVOLOG FLEXPEN 100 UNIT/ML FlexPen Inject 0-8 mLs into the skin in the morning and at bedtime. Per sliding scale: if is BS 70-200 give 0 units, if BS is 201-250 give 2 units, if BS is 251-300 give 4 units, if BS is 301-350 give 6 units, if BS is 351-400 give 8 units. If blood sugar is greater than 400 call MD.     omeprazole  (PRILOSEC) 20 MG capsule Take 20 mg by mouth every other day.     oxyCODONE (OXY IR/ROXICODONE) 5 MG immediate release tablet Take 1 tablet (5 mg total) by mouth every 6 (six) hours as needed for severe pain or breakthrough pain. 15 tablet 0   Pancrelipase, Lip-Prot-Amyl, (CREON) 24000-76000 units CPEP Take 1 capsule by mouth in the morning, at noon, and at bedtime.     senna-docusate (SENOKOT-S) 8.6-50 MG tablet Take 1 tablet by mouth at bedtime as needed for moderate constipation.     Skin Protectants, Misc. (MINERIN CREME EX) Apply 1 application  topically daily. Apply minerin cream to bilateral feet daily. R/t dry/cracked skin     Spacer/Aero-Holding Dorise Bullion Use  as directed with combivent. Ok to dispense any spacer that works with the inhaler being rxed to pt. 1 each 1   TRULICITY 1.5 ON/6.2XB SOPN Inject 1.5 mg into the skin once a week. Fridays     No facility-administered medications prior to visit.     Social History   Socioeconomic History   Marital status: Married    Spouse name: Not on file   Number of children: 6   Years of education: Not on file   Highest education level: Not on file  Occupational History   Not on file  Tobacco Use   Smoking status: Former    Years: 35.00    Types: Cigarettes    Quit date: 04/09/1990    Years since quitting: 31.7   Smokeless tobacco: Never  Substance and Sexual Activity   Alcohol use: No    Comment: "per pt stopped drinking in the 90's"   Drug use: No   Sexual activity: Not on file  Other Topics Concern   Not on file  Social History Narrative   Not on file   Social Determinants of Health   Financial Resource Strain: Not on file  Food Insecurity: Not on file  Transportation Needs: Not on file  Physical Activity: Not on file  Stress: Not on file  Social Connections: Not on file  Intimate Partner Violence: Not on file      Review of Systems     Objective:    There were no vitals taken for this visit. Nursing  note and vital signs reviewed.  Physical Exam     General/constitutional: no distress, pleasant; in wheel chair HEENT: Normocephalic, PER, Conj Clear, EOMI, Oropharynx clear Neck supple CV: rrr no mrg Lungs: clear to auscultation, normal respiratory effort Abd: Soft, Nontender Ext: no edema Skin/msk bilateral feet cushion boot; right distal mt area where toe disarticulated open wound with dressing in place no purulence/cellulitis/fluctuance; mild tenderness     Neuro: nonfocal   Labs:  Micro:  Serology:  Imaging: Reviewed   8/9 mri right foot Distal great toe ulceration with erosion of the distal phalangeal tuft and questionable minimal edema signal at the tip of the residual distal phalanx. There is largely preserved marrow signal. Findings could reflect very early osteomyelitis or reactive marrow change. No evidence of soft tissue abscess.   9/3 mri right foot Postsurgical changes of second toe amputation. Marrow signal abnormality and cortical irregularity at the second and third metatarsal heads and third toe proximal phalanx consistent with early osteomyelitis. Soft tissue swelling without evidence of abscess.   Minimal marrow signal abnormality dorsal aspect of the great toe metatarsal head at the MTP joint with preserved T1 marrow signal and trace first MTP joint fluid, may suggest subacute osteomyelitis given cortical regularity on recent CT.   Mild marrow edema in the adjacent first MTP lateral sesamoid, potentially early osteomyelitis or reactive change.  Assessment & Plan:   Problem List Items Addressed This Visit       Endocrine   Diabetic foot infection ([place]) - Primary   Relevant Orders   C-reactive protein   CBC w/Diff   COMPLETE METABOLIC PANEL WITH GFR   Other Visit Diagnoses     Peripheral arterial disease ([place])       Relevant Orders   C-reactive protein   CBC w/Diff   COMPLETE METABOLIC PANEL WITH GFR         No orders of  the defined types were placed in this encounter.  Abx:   9/4-c cefadroxil 9/4-c doxy   Prior to 9/4 Outpatient doxy continued                                                       Assessment: Ams ?infection vs other Recent diabetic foot infection 2nd toe om s/p disarticulation; cx mrsa dm2  Pvd   87 y.o. male pmh DM2, HTN, PAD, stroke, CKD 3b, recent gangrene 2nd left toe s/p disarticulation, admitted from snf for ams, with imaging showing sign of new om in his 2nd, 60rd, and 1st ray  I am unclear what the underlying reason for ams is I suspect it is probably delirium. I do not see obvious active cellulitis in the foot and I suspect the new mri changes could be explained by ongoing dry gangrene process, but also potentially ongoing progressive OM   Ucx negative; no bcx obtained but no sepsis physiology otherwise, and ams already improving quickly     Certainly could be om changes too which would require biopsy/cx for definitive dx   But given his significant PAD I am not sure the benefit is in his favor with more local foot debridement or tma   I agree with dr Jacqualyn Posey maybe a trial of long term abx is worth while before further decision is made, with vascular input  Patient discharged on doxy/cefadroxil planned 6 weeks  ------- 9/13 clinica assement Still open wound where the 2nd toe was disarticulated at mtp joint no sign of abscess/cellulitis He appears very well baseline in terms of good mentation   -labs today -continue cefadroxil/doxy until 10/12 -see me on 10/25 a couple weeks after abx finish -continue to see wound and podiatry clinic as discussed with them   I have spent a total of 20 minutes of face-to-face and non-face-to-face time, excluding clinical staff time, preparing to see patient, ordering tests and/or medications, and provide counseling the patient   Follow-up: Return in about 6 weeks (around 01/31/2022).      Jabier Mutton, [facility]  for Infectious [facility] 12/20/2021, 2:25 PM

## 2021-12-20 NOTE — Patient Instructions (Signed)
At this time your wound appear to be healing and the infection in the bone appears to be under controll  It would be definitive once we stop the antibiotics and see  The plan for doxycycline and augmentin is until 10/12   See me about 2 weeks after 10/12   Continue to stay off your foot, follow up with wound clinic and podiatry as discussed with them

## 2021-12-21 ENCOUNTER — Encounter: Payer: Medicare Other | Admitting: Podiatry

## 2021-12-21 LAB — CBC WITH DIFFERENTIAL/PLATELET
Absolute Monocytes: 669 cells/uL (ref 200–950)
Basophils Absolute: 175 cells/uL (ref 0–200)
Basophils Relative: 1.8 %
Eosinophils Absolute: 718 cells/uL — ABNORMAL HIGH (ref 15–500)
Eosinophils Relative: 7.4 %
HCT: 32.6 % — ABNORMAL LOW (ref 38.5–50.0)
Hemoglobin: 10.6 g/dL — ABNORMAL LOW (ref 13.2–17.1)
Lymphs Abs: 3259 cells/uL (ref 850–3900)
MCH: 29.9 pg (ref 27.0–33.0)
MCHC: 32.5 g/dL (ref 32.0–36.0)
MCV: 91.8 fL (ref 80.0–100.0)
MPV: 9.9 fL (ref 7.5–12.5)
Monocytes Relative: 6.9 %
Neutro Abs: 4879 cells/uL (ref 1500–7800)
Neutrophils Relative %: 50.3 %
Platelets: 360 10*3/uL (ref 140–400)
RBC: 3.55 10*6/uL — ABNORMAL LOW (ref 4.20–5.80)
RDW: 13.7 % (ref 11.0–15.0)
Total Lymphocyte: 33.6 %
WBC: 9.7 10*3/uL (ref 3.8–10.8)

## 2021-12-21 LAB — COMPLETE METABOLIC PANEL WITH GFR
AG Ratio: 0.6 (calc) — ABNORMAL LOW (ref 1.0–2.5)
ALT: 10 U/L (ref 9–46)
AST: 21 U/L (ref 10–35)
Albumin: 3.7 g/dL (ref 3.6–5.1)
Alkaline phosphatase (APISO): 104 U/L (ref 35–144)
BUN/Creatinine Ratio: 12 (calc) (ref 6–22)
BUN: 24 mg/dL (ref 7–25)
CO2: 22 mmol/L (ref 20–32)
Calcium: 9.3 mg/dL (ref 8.6–10.3)
Chloride: 100 mmol/L (ref 98–110)
Creat: 2.01 mg/dL — ABNORMAL HIGH (ref 0.70–1.22)
Globulin: 5.8 g/dL (calc) — ABNORMAL HIGH (ref 1.9–3.7)
Glucose, Bld: 188 mg/dL — ABNORMAL HIGH (ref 65–99)
Potassium: 4.2 mmol/L (ref 3.5–5.3)
Sodium: 132 mmol/L — ABNORMAL LOW (ref 135–146)
Total Bilirubin: 0.8 mg/dL (ref 0.2–1.2)
Total Protein: 9.5 g/dL — ABNORMAL HIGH (ref 6.1–8.1)
eGFR: 32 mL/min/{1.73_m2} — ABNORMAL LOW (ref >=60)

## 2021-12-21 LAB — C-REACTIVE PROTEIN: CRP: 3.2 mg/L (ref <8.0)

## 2021-12-25 ENCOUNTER — Encounter (HOSPITAL_BASED_OUTPATIENT_CLINIC_OR_DEPARTMENT_OTHER): Payer: Medicare Other | Admitting: General Surgery

## 2021-12-25 DIAGNOSIS — E1142 Type 2 diabetes mellitus with diabetic polyneuropathy: Secondary | ICD-10-CM | POA: Diagnosis not present

## 2021-12-26 NOTE — Progress Notes (Signed)
Justin [name] ([id number]) . Visit Report for 12/25/2021 Chief Complaint Document Details Patient Name: Date of Service: Justin Justin [name] 12/25/2021 3:30 PM Medical Record Number: [id number] Patient Account Number: [id number] Date of Birth/Sex: Treating RN: 1935/01/03 (87 y.o. [name] Primary Care Provider: [facility] [place] Other Clinician: Referring Provider: Treating Provider/Extender: Fredirick Maudlin [facility], [place] Weeks in Treatment: 0 Information Obtained from: Patient Chief Complaint Patients presents for treatment of open diabetic ulcers and dehiscence of surgical site Electronic Signature(s) Signed: 12/25/2021 5:10:50 PM By: Fredirick Maudlin MD FACS Entered By: Fredirick Maudlin on 12/25/2021 17:10:50 -------------------------------------------------------------------------------- Debridement Details Patient Name: Date of Service: Justin Staggers [name]. 12/25/2021 3:30 PM Medical Record Number: [id number] Patient Account Number: [id number] Date of Birth/Sex: Treating RN: 28-Feb-1935 (87 y.o. [name] Primary Care Provider: [facility] [place] Other Clinician: Referring Provider: Treating Provider/Extender: Fredirick Maudlin [facility], [place] Weeks in Treatment: 0 Debridement Performed for Assessment: Wound #1 Right Amputation Site - Toe Performed By: Physician Fredirick Maudlin, MD Debridement Type: Debridement Severity of Tissue Pre Debridement: Necrosis of bone Level of Consciousness (Pre-procedure): Awake and Alert Pre-procedure Verification/Time Out Yes - 16:40 Taken: Start Time: 16:42 Pain Control: Lidocaine 4% [name] opical Solution [name] Area Debrided (L x W): otal 2.8 (cm) x 1.8 (cm) = 5.04 (cm) Tissue and other material debrided: Viable, Non-Viable, Fat, Slough, Subcutaneous, Slough Level: Skin/Subcutaneous Tissue Debridement Description: Excisional Instrument: Curette, Forceps, Scissors Bleeding: Minimum Hemostasis Achieved: Pressure Procedural  Pain: 0 Post Procedural Pain: 0 Response to Treatment: Procedure was tolerated well Level of Consciousness (Post- Awake and Alert procedure): Post Debridement Measurements of Total Wound Length: (cm) 2.8 Width: (cm) 1.8 Depth: (cm) 2.3 Volume: (cm) 9.104 Character of Wound/Ulcer Post Debridement: Requires Further Debridement Severity of Tissue Post Debridement: Necrosis of bone Post Procedure Diagnosis Same as Pre-procedure Notes scribed by L. Boehlein, RN for Dr. Celine Ahr Electronic Signature(s) Signed: 12/25/2021 6:17:04 PM By: Baruch Gouty RN, BSN Signed: 12/26/2021 7:36:30 AM By: Fredirick Maudlin MD FACS Previous Signature: 12/25/2021 5:19:27 PM Version By: Fredirick Maudlin MD FACS Entered By: Baruch Gouty on 12/25/2021 18:14:32 -------------------------------------------------------------------------------- Debridement Details Patient Name: Date of Service: Justin Staggers [name]. 12/25/2021 3:30 PM Medical Record Number: [id number] Patient Account Number: [id number] Date of Birth/Sex: Treating RN: 1934/06/05 (87 y.o. [name] Primary Care Provider: [facility] [place] Other Clinician: Referring Provider: Treating Provider/Extender: Fredirick Maudlin [facility], [place] We[place] in Treatment: 0 Debridement Performed for Assessment: Wound #2 Right [name] Performed By: Physician Fredirick Maudlin, MD Debridement Type: Debridement Severity of Tissue Pre Debridement: Necrosis of bone Level of Consciousness (Pre-procedure): Awake and Alert Pre-procedure Verification/Time Out Yes - 16:40 Taken: Start Time: 16:42 Pain Control: Lidocaine 4% [name] opical Solution [name] Area Debrided (L x W): otal 0.8 (cm) x 0.8 (cm) = 0.64 (cm) Tissue and other material debrided: Non-Viable, Slough, Slough Level: Non-Viable Tissue Debridement Description: Selective/Open Wound Instrument: Curette Bleeding: Minimum Hemostasis Achieved: Pressure Procedural Pain: 0 Post Procedural Pain:  0 Response to Treatment: Procedure was tolerated well Level of Consciousness (Post- Awake and Alert procedure): Post Debridement Measurements of Total Wound Length: (cm) 0.8 Width: (cm) 0.8 Depth: (cm) 0.1 Volume: (cm) 0.05 Character of Wound/Ulcer Post Debridement: Requires Further Debridement Severity of Tissue Post Debridement: Necrosis of bone Post Procedure Diagnosis Same as Pre-procedure Notes scribed by L. Boehlein, RN for Dr. Celine Ahr Electronic Signature(s) Signed: 12/25/2021 6:17:04 PM By: Baruch Gouty RN, BSN Signed: 12/26/2021 7:36:30 AM By: Fredirick Maudlin MD FACS Previous Signature: 12/25/2021 5:19:27 PM Version By: Celine Ahr  Anderson Malta MD FACS Entered By: Baruch Gouty on 12/25/2021 18:14:43 -------------------------------------------------------------------------------- HPI Details Patient Name: Date of Service: Justin [name] 12/25/2021 3:30 PM Medical Record Number: [id number] Patient Account Number: [id number] Date of Birth/Sex: Treating RN: 25-May-1934 (87 y.o. [name] Primary Care Provider: [facility] [place] Other Clinician: Referring Provider: Treating Provider/Extender: Fredirick Maudlin [facility], [place] Weeks in Treatment: 0 History of Present Illness HPI Description: ADMISSION 12/19/2021 This is an 87 year old man with multiple medical problems including type 2 diabetes mellitus, chronic pancreatitis, coronary artery disease, peripheral vascular disease, stroke and TIA. He was admitted to the hospital on November 15, 2021 with a diabetic foot infection. An MRI was performed on admission that was questionable for early osteomyelitis. He was seen in consultation by both podiatry and vascular surgery. Due to limb threatening ischemia, he underwent a lower extremity angiogram with multiple angioplasties on August 18. He then had amputation of his right second toe on August 20. There was frank pus present which was cultured. He was placed on a course of  oral antibiotics. He subsequently was discharged but returned due to altered mental status on September 2. Repeat MRI was performed that was again concerning for osteomyelitis. Infectious disease was consulted. A 4-week course of oral doxycycline and cefadroxil was prescribed. Podiatry referred him to the wound care center for further evaluation and management. On exam, there is a recent right second toe amputation site. The Prolene sutures are gaping and the wound has a fairly deep cavity; it does probe to bone. There is no purulent drainage or odor, but there is quite a bit of fat necrosis present. On the lateral aspect of his third toe, there is an ulcer that has a leathery eschar overlying it. Underneath, bone and tendon are exposed. No purulent drainage or malodor from this wound, either. 12/25/2021: There is still extensive slough and fat necrosis in the right second toe amputation site. The lateral third toe wound has also accumulated quite a bit of slough. Today he did not have the prescribed dressing in situ, just a bit of gauze over the wound sites. Electronic Signature(s) Signed: 12/25/2021 5:13:09 PM By: Fredirick Maudlin MD FACS Entered By: Fredirick Maudlin on 12/25/2021 17:13:09 -------------------------------------------------------------------------------- Physical Exam Details Patient Name: Date of Service: Justin Staggers [name]. 12/25/2021 3:30 PM Medical Record Number: [id number] Patient Account Number: [id number] Date of Birth/Sex: Treating RN: 1934/07/01 (87 y.o. [name] Primary Care Provider: [place] Other Clinician: Referring Provider: Treating Provider/Extender: Fredirick Maudlin [facility], [place] We[place] in Treatment: 0 Constitutional Slightly hypertensive. . . . No acute distress.Marland Kitchen Respiratory Normal work of breathing on room air.. Notes 12/25/2021: There is still extensive slough and fat necrosis in the right second toe amputation site. The lateral third  toe wound has also accumulated quite a bit of slough. Electronic Signature(s) Signed: 12/25/2021 5:13:41 PM By: Fredirick Maudlin MD FACS Entered By: Fredirick Maudlin on 12/25/2021 17:13:41 -------------------------------------------------------------------------------- Physician Orders Details Patient Name: Date of Service: Justin Staggers [name]. 12/25/2021 3:30 PM Medical Record Number: [id number] Patient Account Number: [id number] Date of Birth/Sex: Treating RN: September 15, 1934 (87 y.o. [name] Primary Care Provider: [facility] [place] Other Clinician: Referring Provider: Treating Provider/Extender: Fredirick Maudlin [facility], [place] Weeks in Treatment: 0 Verbal / Phone Orders: No Diagnosis Coding ICD-10 Coding Code Description E11.42 Type 2 diabetes mellitus with diabetic polyneuropathy N18.32 Chronic kidney disease, stage 3b I73.9 Peripheral vascular disease, unspecified I25.10 Atherosclerotic heart disease of native coronary artery without angina pectoris I10 Essential (primary) hypertension  K86.1 Other chronic pancreatitis E11.621 Type 2 diabetes mellitus with foot ulcer T81.31XS Disruption of external operation (surgical) wound, not elsewhere classified, sequela 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 - Dr. Celine Ahr RM 1 with Vaughan Basta Return A Anesthetic Wound #1 Right Amputation Site - Toe (In clinic) Topical Lidocaine 4% applied to wound bed Wound #2 Right [name] (In clinic) Topical Lidocaine 4% applied to wound bed Bathing/ Shower/ Hygiene Other Bathing/Shower/Hygiene Orders/Instructions: - may wash feet with soap and water with dressing changes Off-Loading Heel suspension boot to: - podus boots bilateral  feet Wound Treatment Wound #1 - Amputation Site - Toe Wound Laterality: Right Cleanser: Soap and Water 1 x Per Day/30 Days Discharge Instructions: May shower and wash wound with dial antibacterial soap and water prior to dressing change. Cleanser: Wound Cleanser 1 x Per Day/30 Days Discharge Instructions: Cleanse the wound with wound cleanser prior to applying a clean dressing using gauze sponges, not tissue or cotton balls. Peri-Wound Care: Sween Lotion (Moisturizing lotion) 1 x Per Day/30 Days Discharge Instructions: Apply moisturizing lotion to both feet with dressing changes Prim Dressing: KerraCel Ag Gelling Fiber Dressing, 2x2 in (silver alginate) 1 x Per Day/30 Days ary Discharge Instructions: Apply silver alginate to wound bed pack lightly into wound bed Prim Dressing: Iodosorb Gel 10 (gm) Tube 1 x Per Day/30 Days ary Discharge Instructions: Apply to bottom of wound bed, silver alginate to fill dead space Secondary Dressing: Woven Gauze Sponge, Non-Sterile 4x4 in 1 x Per Day/30 Days Discharge Instructions: Apply over primary dressing as directed. Secured With: [employer], 4.5x3.1 (in/yd) 1 x Per Day/30 Days Discharge Instructions: Secure with Kerlix as directed. Secured With: 34M Medipore H Soft Cloth Surgical [name] ape, 4 x 10 (in/yd) 1 x Per Day/30 Days Discharge Instructions: Secure with tape as directed. Wound #2 - [name] Wound Laterality: Right Cleanser: Soap and Water 1 x Per Day/30 Days Discharge Instructions: May shower and wash wound with dial antibacterial soap and water prior to dressing change. Cleanser: Wound Cleanser 1 x Per Day/30 Days Discharge Instructions: Cleanse the wound with wound cleanser prior to applying a clean dressing using gauze sponges, not tissue or cotton balls. Peri-Wound Care: Sween Lotion (Moisturizing lotion) 1 x Per Day/30 Days Discharge Instructions: Apply moisturizing lotion to both feet with dressing changes Prim Dressing: Iodosorb  Gel 10 (gm) Tube 1 x Per Day/30 Days ary Discharge Instructions: Apply to wound bed as instructed Secondary Dressing: Woven Gauze Sponge, Non-Sterile 4x4 in 1 x Per Day/30 Days Discharge Instructions: Apply over primary dressing as directed. Secured With: [employer], 4.5x3.1 (in/yd) 1 x Per Day/30 Days Discharge Instructions: Secure with Kerlix as directed. Secured With: 34M Medipore H Soft Cloth Surgical [name] ape, 4 x 10 (in/yd) 1 x Per Day/30 Days Discharge Instructions: Secure with tape as directed. Electronic Signature(s) Signed: 12/25/2021 5:19:27 PM By: Fredirick Maudlin MD FACS Entered By: Fredirick Maudlin on 12/25/2021 17:13:52 -------------------------------------------------------------------------------- Problem List Details Patient Name: Date of Service: Justin Staggers [name]. 12/25/2021 3:30 PM Medical Record Number: [id number] Patient Account Number: [id number] Date of Birth/Sex: Treating RN: 15-Feb-1935 (87 y.o. [name] Primary Care Provider: [place] Other Clinician: Referring Provider: Treating Provider/Extender: Fredirick Maudlin [facility], [place] Weeks in Treatment: 0 Active Problems  ICD-10 Encounter Code Description Active Date MDM Diagnosis E11.42 Type 2 diabetes mellitus with diabetic polyneuropathy 12/19/2021 No Yes N18.32 Chronic kidney disease, stage 3b 12/19/2021 No Yes I73.9 Peripheral vascular disease, unspecified 12/19/2021 No Yes I25.10 Atherosclerotic heart disease of native coronary artery without angina pectoris 12/19/2021 No Yes I10 Essential (primary) hypertension 12/19/2021 No Yes K86.1 Other chronic pancreatitis 12/19/2021 No Yes E11.621 Type 2 diabetes mellitus with foot ulcer 12/19/2021 No Yes T81.31XS Disruption of external operation (surgical) wound, not elsewhere classified, 12/19/2021 No Yes sequela L97.514 Non-pressure chronic ulcer of other part of right foot with necrosis of bone 12/19/2021 No Yes L97.516 Non-pressure chronic  ulcer of other part of right foot with bone involvement 12/19/2021 No Yes without evidence of necrosis M86.171 Other acute osteomyelitis, right ankle and foot 12/19/2021 No Yes Inactive Problems Resolved Problems Electronic Signature(s) Signed: 12/25/2021 5:10:15 PM By: Fredirick Maudlin MD FACS Entered By: Fredirick Maudlin on 12/25/2021 17:10:15 -------------------------------------------------------------------------------- Progress Note Details Patient Name: Date of Service: Justin Staggers [name]. 12/25/2021 3:30 PM Medical Record Number: [id number] Patient Account Number: [id number] Date of Birth/Sex: Treating RN: 04-Sep-1934 (87 y.o. [name] Primary Care Provider: [facility] Other Clinician: Referring Provider: Treating Provider/Extender: Fredirick Maudlin [facility], [place] We[place] in Treatment: 0 Subjective Chief Complaint Information obtained from Patient Patients presents for treatment of open diabetic ulcers and dehiscence of surgical site History of Present Illness (HPI) ADMISSION 12/19/2021 This is an 87 year old man with multiple medical problems including type 2 diabetes mellitus, chronic pancreatitis, coronary artery disease, peripheral vascular disease, stroke and TIA. He was admitted to the hospital on November 15, 2021 with a diabetic foot infection. An MRI was performed on admission that was questionable for early osteomyelitis. He was seen in consultation by both podiatry and vascular surgery. Due to limb threatening ischemia, he underwent a lower extremity angiogram with multiple angioplasties on August 18. He then had amputation of his right second toe on August 20. There was frank pus present which was cultured. He was placed on a course of oral antibiotics. He subsequently was discharged but returned due to altered mental status on September 2. Repeat MRI was performed that was again concerning for osteomyelitis. Infectious disease was consulted. A 4-week course of  oral doxycycline and cefadroxil was prescribed. Podiatry referred him to the wound care center for further evaluation and management. On exam, there is a recent right second toe amputation site. The Prolene sutures are gaping and the wound has a fairly deep cavity; it does probe to bone. There is no purulent drainage or odor, but there is quite a bit of fat necrosis present. On the lateral aspect of his third toe, there is an ulcer that has a leathery eschar overlying it. Underneath, bone and tendon are exposed. No purulent drainage or malodor from this wound, either. 12/25/2021: There is still extensive slough and fat necrosis in the right second toe amputation site. The lateral third toe wound has also accumulated quite a bit of slough. Today he did not have the prescribed dressing in situ, just a bit of gauze over the wound sites. Patient History Information obtained from Patient, Chart. Family History Diabetes, Heart Disease - Mother, Hypertension - Mother,Siblings, No family history of Cancer, Hereditary Spherocytosis, Kidney Disease, Lung Disease, Seizures, Stroke, Thyroid Problems, Tuberculosis. Social History Former smoker, Marital Status - Married, Alcohol Use - Never, Drug Use - No History, Caffeine Use - Daily - coffee. Medical History Eyes Denies history of Cataracts, Glaucoma, Optic Neuritis Hematologic/Lymphatic Patient has  history of Anemia Cardiovascular Patient has history of Coronary Artery Disease, Hypertension, Peripheral Arterial Disease Endocrine Patient has history of Type II Diabetes Denies history of Type I Diabetes Genitourinary Denies history of End Stage Renal Disease Integumentary (Skin) Denies history of History of Burn Musculoskeletal Patient has history of Osteoarthritis Neurologic Denies history of Neuropathy Oncologic Denies history of Received Chemotherapy, Received Radiation Psychiatric Patient has history of Anorexia/bulimia - poor  apetite Denies history of Confinement Anxiety Hospitalization/Surgery History - right 2nd toe amputation 11/26/21. - agram. - peripheral balloon angioplasty 11/24/21. - temporal artery biopsy. - back surgery. - neck surgery. - cholecystectomy. Medical A Surgical History Notes nd Gastrointestinal chronic pancreatitis Genitourinary CKD st 3, testosterone deficiency Neurologic CVA Objective Constitutional Slightly hypertensive. No acute distress.. Vitals Time Taken: 4:10 PM, Height: 72 in, Weight: 146 lbs, BMI: 19.8, Temperature: 98.6 F, Pulse: 89 bpm, Respiratory Rate: 18 breaths/min, Blood Pressure: 144/70 mmHg. Respiratory Normal work of breathing on room air.. General Notes: 12/25/2021: There is still extensive slough and fat necrosis in the right second toe amputation site. The lateral third toe wound has also accumulated quite a bit of slough. Integumentary (Hair, Skin) Wound #1 status is Open. Original cause of wound was Surgical Injury. The date acquired was: 11/26/2021. The wound is located on the Right Amputation Site - [name] The wound measures 2.8cm length x 1.8cm width x 2.3cm depth; 3.958cm^2 area and 9.104cm^3 volume. There is bone and Fat Layer (Subcutaneous oe. Tissue) exposed. There is no tunneling or undermining noted. There is a medium amount of serosanguineous drainage noted. The wound margin is distinct with the outline attached to the wound base. There is no granulation within the wound bed. There is a large (67-100%) amount of necrotic tissue within the wound bed including Adherent Slough. Wound #2 status is Open. Original cause of wound was Gradually Appeared. The date acquired was: 11/26/2021. The wound is located on the Right [name] Third. oe The wound measures 0.8cm length x 0.8cm width x 0.1cm depth; 0.503cm^2 area and 0.05cm^3 volume. There is bone and Fat Layer (Subcutaneous Tissue) exposed. There is no tunneling or undermining noted. There is a small amount of  serosanguineous drainage noted. The wound margin is flat and intact. There is no granulation within the wound bed. There is a large (67-100%) amount of necrotic tissue within the wound bed including Adherent Slough. 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 There was a Excisional Skin/Subcutaneous Tissue Debridement with a total area of 5.04 sq cm performed by Fredirick Maudlin, MD. With the following instrument(s): Curette, Forceps, and Scissors to remove Viable and Non-Viable tissue/material. Material removed includes Fat, Subcutaneous Tissue, and Slough after achieving pain control using Lidocaine 4% [name] opical Solution. No specimens were taken. A time out was conducted at 16:40, prior to the start of the procedure. A  Minimum amount of bleeding was controlled with Pressure. The procedure was tolerated well with a pain level of 0 throughout and a pain level of 0 following the procedure. Post Debridement Measurements: 2.8cm length x 1.8cm width x 2.3cm depth; 9.104cm^3 volume. Character of Wound/Ulcer Post Debridement requires further debridement. Severity of Tissue Post Debridement  is: Necrosis of bone. Post procedure Diagnosis Wound #1: Same as Pre-Procedure Wound #2 Pre-procedure diagnosis of Wound #2 is a Diabetic Wound/Ulcer of the Lower Extremity located on the Right [name] Third .Severity of Tissue Pre Debridement is: oe Necrosis of bone. There was a Selective/Open Wound Non-Viable Tissue Debridement with a total area of 0.64 sq cm performed by Fredirick Maudlin, MD. With the following instrument(s): Curette to remove Non-Viable tissue/material. Material removed includes [facility] after achieving pain control using Lidocaine 4% [name] opical Solution. No specimens were taken. A time out was conducted at 16:40, prior to the start of the procedure. A Minimum amount of bleeding was controlled with Pressure. The procedure was tolerated well with a pain level of 0 throughout and a pain level of 0 following the procedure. Post Debridement Measurements: 0.8cm length x 0.8cm width x 0.1cm depth; 0.05cm^3 volume. Character of Wound/Ulcer Post Debridement requires further debridement. Severity of Tissue Post Debridement is: Necrosis of bone. Post procedure Diagnosis Wound #2: Same as Pre-Procedure Plan Follow-up Appointments: Return Appointment in 1 week. - Dr. Celine Ahr RM 1 with Vaughan Basta Anesthetic: Wound #1 Right Amputation Site - [name]: (In clinic) Topical Lidocaine 4% applied to wound bed Wound #2 Right [name] Third: oe (In clinic) Topical Lidocaine 4% applied to wound bed Bathing/ Shower/ Hygiene: Other Bathing/Shower/Hygiene Orders/Instructions: - may wash feet with soap and water with dressing changes Off-Loading: Heel suspension boot to: - podus boots bilateral feet WOUND #1: - Amputation Site - [name] Wound Laterality: Right Cleanser: Soap and Water 1 x Per Day/30 Days Discharge Instructions: May shower and wash wound with dial antibacterial soap and water prior to dressing change. Cleanser: Wound Cleanser 1 x Per Day/30 Days Discharge Instructions: Cleanse the wound with wound  cleanser prior to applying a clean dressing using gauze sponges, not tissue or cotton balls. Peri-Wound Care: Sween Lotion (Moisturizing lotion) 1 x Per Day/30 Days Discharge Instructions: Apply moisturizing lotion to both feet with dressing changes Prim Dressing: KerraCel Ag Gelling Fiber Dressing, 2x2 in (silver alginate) 1 x Per Day/30 Days ary Discharge Instructions: Apply silver alginate to wound bed pack lightly into wound bed Prim Dressing: Iodosorb Gel 10 (gm) Tube 1 x Per Day/30 Days ary Discharge Instructions: Apply to bottom of wound bed, silver alginate to fill dead space Secondary Dressing: Woven Gauze Sponge, Non-Sterile 4x4 in 1 x Per Day/30 Days Discharge Instructions: Apply over primary dressing as directed. Secured With: [employer], 4.5x3.1 (in/yd) 1 x Per Day/30 Days Discharge Instructions: Secure with Kerlix as directed. Secured With: 68M Medipore H Soft Cloth Surgical [name] ape, 4 x 10 (in/yd) 1 x Per Day/30 Days Discharge Instructions: Secure with tape as directed. WOUND #2: - [name] Third Wound Laterality: Right oe Cleanser: Soap and Water 1 x Per Day/30 Days Discharge Instructions: May shower and wash wound with dial antibacterial soap and water prior to dressing change. Cleanser: Wound Cleanser 1 x Per Day/30 Days Discharge Instructions: Cleanse the wound with wound cleanser prior to applying a clean dressing using gauze sponges, not tissue or cotton balls. Peri-Wound Care: Sween Lotion (Moisturizing lotion) 1 x Per Day/30 Days Discharge Instructions: Apply moisturizing lotion to both  feet with dressing changes Prim Dressing: Iodosorb Gel 10 (gm) Tube 1 x Per Day/30 Days ary Discharge Instructions: Apply to wound bed as instructed Secondary Dressing: Woven Gauze Sponge, Non-Sterile 4x4 in 1 x Per Day/30 Days Discharge Instructions: Apply over primary dressing as directed. Secured With: [employer], 4.5x3.1 (in/yd) 1 x Per Day/30 Days Discharge  Instructions: Secure with Kerlix as directed. Secured With: 13M Medipore H Soft Cloth Surgical [name] ape, 4 x 10 (in/yd) 1 x Per Day/30 Days Discharge Instructions: Secure with tape as directed. 12/25/2021: There is still extensive slough and fat necrosis in the right second toe amputation site. The lateral third toe wound has also accumulated quite a bit of slough. I used a curette as well as tenotomy scissors and forceps to debride slough, necrotic fat, and nonviable subcutaneous tissue from the amputation site. I used a curette to debride slough from the lateral third toe wound. I am going to add Iodosorb to his dressing changes under the silver alginate to try and further chemically debride both sites. He will continue on his antibiotic regimen as prescribed by infectious disease for osteomyelitis. Follow-up in 1 week. Electronic Signature(s) Signed: 12/25/2021 5:14:52 PM By: Fredirick Maudlin MD FACS Entered By: Fredirick Maudlin on 12/25/2021 17:14:51 -------------------------------------------------------------------------------- HxROS Details Patient Name: Date of Service: Justin Staggers [name]. 12/25/2021 3:30 PM Medical Record Number: [id number] Patient Account Number: [id number] Date of Birth/Sex: Treating RN: 1934-05-27 (87 y.o. [name] Primary Care Provider: [place] Other Clinician: Referring Provider: Treating Provider/Extender: Fredirick Maudlin [facility], [place] Weeks in Treatment: 0 Information Obtained From Patient Chart Eyes Medical History: Negative for: Cataracts; Glaucoma; Optic Neuritis Hematologic/Lymphatic Medical History: Positive for: Anemia Cardiovascular Medical History: Positive for: Coronary Artery Disease; Hypertension; Peripheral Arterial Disease Gastrointestinal Medical History: Past Medical History Notes: chronic pancreatitis Endocrine Medical History: Positive for: Type II Diabetes Negative for: Type I Diabetes Treated with:  Insulin Blood sugar tested every day: Yes Tested : 2 times per day Genitourinary Medical History: Negative for: End Stage Renal Disease Past Medical History Notes: CKD st 3, testosterone deficiency Integumentary (Skin) Medical History: Negative for: History of Burn Musculoskeletal Medical History: Positive for: Osteoarthritis Neurologic Medical History: Negative for: Neuropathy Past Medical History Notes: CVA Oncologic Medical History: Negative for: Received Chemotherapy; Received Radiation Psychiatric Medical History: Positive for: Anorexia/bulimia - poor apetite Negative for: Confinement Anxiety Immunizations Pneumococcal Vaccine: Received Pneumococcal Vaccination: Yes Received Pneumococcal Vaccination On or After 60th Birthday: Yes Implantable Devices No devices added Hospitalization / Surgery History Type of Hospitalization/Surgery right 2nd toe amputation 11/26/21 [place] peripheral balloon angioplasty 11/24/21 temporal artery biopsy back surgery neck surgery cholecystectomy Family and Social History Cancer: No; Diabetes: Yes; Heart Disease: Yes - Mother; Hereditary Spherocytosis: No; Hypertension: Yes - Mother,Siblings; Kidney Disease: No; Lung Disease: No; Seizures: No; Stroke: No; Thyroid Problems: No; Tuberculosis: No; Former smoker; Marital Status - Married; Alcohol Use: Never; Drug Use: No History; Caffeine Use: Daily - coffee; Financial Concerns: No; Food, Clothing or Shelter Needs: No; Support System Lacking: No; Transportation Concerns: No Electronic Signature(s) Signed: 12/25/2021 5:19:27 PM By: Fredirick Maudlin MD FACS Signed: 12/25/2021 6:17:04 PM By: Baruch Gouty RN, BSN Entered By: Fredirick Maudlin on 12/25/2021 17:13:15 -------------------------------------------------------------------------------- SuperBill Details Patient Name: Date of Service: Justin Staggers [name]. 12/25/2021 Medical Record Number: [id number] Patient Account Number:  [id number] Date of Birth/Sex: Treating RN: [birth year] (87 y.o. [name] Primary Care Provider: [place], [place] Other Clinician: Referring Provider: Treating Provider/Extender: Fredirick Maudlin [facility], [place] Weeks in Treatment:  0 Diagnosis Coding ICD-10 Codes Code Description E11.42 Type 2 diabetes mellitus with diabetic polyneuropathy N18.32 Chronic kidney disease, stage 3b I73.9 Peripheral vascular disease, unspecified I25.10 Atherosclerotic heart disease of native coronary artery without angina pectoris I10 Essential (primary) hypertension K86.1 Other chronic pancreatitis E11.621 Type 2 diabetes mellitus with foot ulcer T81.31XS Disruption of external operation (surgical) wound, not elsewhere classified, sequela 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 of necrosis M86.171 Other acute osteomyelitis, right ankle and foot Facility Procedures CPT4 Code: [id number] 1 Description: 1042 - DEB SUBQ TISSUE 20 SQ CM/< ICD-10 Diagnosis Description L97.514 Non-pressure chronic ulcer of other part of right foot with necrosis of bone Modifier: Quantity: 1 CPT4 Code: 41740814 9 Description: 7597 - DEBRIDE WOUND 1ST 20 SQ CM OR < ICD-10 Diagnosis Description L97.516 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 with Modifier: out evidence of nec Quantity: 1 rosis Physician Procedures : CPT4 Code Description Modifier [id number] 14970 - WC PHYS LEVEL 4 - EST PT 25 ICD-10 Diagnosis Description L97.514 Non-pressure chronic ulcer of other part of right foot with necrosis of bone L97.516 Non-pressure chronic ulcer of other part of right foot  with bone involvement without evidence of necr T81.31XS Disruption of external operation (surgical) wound, not elsewhere classified, sequela M86.171 Other acute osteomyelitis, right  ankle and foot Quantity: 1 osis : [id number] 11042 - WC PHYS SUBQ TISS 20 SQ CM ICD-10 Diagnosis Description L97.514 Non-pressure chronic ulcer of other part of right foot with necrosis of bone Quantity: 1 : [id number] 12878 - WC PHYS DEBR WO ANESTH 20 SQ CM ICD-10 Diagnosis Description L97.516 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 without evidence of necr Quantity: 1 osis Electronic Signature(s) Signed: 12/25/2021 5:15:18 PM By: Fredirick Maudlin MD FACS Entered By: Fredirick Maudlin on 12/25/2021 17:15:17

## 2021-12-28 ENCOUNTER — Ambulatory Visit (INDEPENDENT_AMBULATORY_CARE_PROVIDER_SITE_OTHER): Payer: Medicare Other | Admitting: Podiatry

## 2021-12-28 DIAGNOSIS — I739 Peripheral vascular disease, unspecified: Secondary | ICD-10-CM

## 2021-12-28 DIAGNOSIS — M86171 Other acute osteomyelitis, right ankle and foot: Secondary | ICD-10-CM

## 2021-12-28 DIAGNOSIS — N1832 Chronic kidney disease, stage 3b: Secondary | ICD-10-CM

## 2021-12-28 DIAGNOSIS — E1122 Type 2 diabetes mellitus with diabetic chronic kidney disease: Secondary | ICD-10-CM | POA: Diagnosis not present

## 2021-12-28 DIAGNOSIS — Z794 Long term (current) use of insulin: Secondary | ICD-10-CM

## 2021-12-28 DIAGNOSIS — L97514 Non-pressure chronic ulcer of other part of right foot with necrosis of bone: Secondary | ICD-10-CM | POA: Diagnosis not present

## 2021-12-28 NOTE — Progress Notes (Signed)
Subjective:  Patient ID: Justin [name], male    DOB: [birth year],  MRN: [id number]  Chief Complaint  Patient presents with   Justin [name] follow up, Ulcer on the left foot     87 y.o. male presents with ulcer present at the distal right forefoot at site of prior right second toe amputation.  He had the amputation done by [name] on November 26, 2021.  He had revascularization per [name] on 11/24/2021 with restoration of inline flow but disadvantaged pedal circulation per report.  He has since developed a wound at the site of the amputation.  Last seen by [name] December 01, 2021.  He was referred to the wound care center and saw them on December 25, 2021.  He also saw infectious disease on December 21, 2018.  Repeat MRI was done of the right foot on September 3 that demonstrated marrow signal abnormality and cortical irregularity of the second and third metatarsal heads in the third toe proximal phalanx consistent with early osteomyelitis soft tissue swelling without evidence of abscess.  Infectious disease thought that his ongoing altered mental status may be due to ongoing dry gangrene process but also possibly progressive osteomyelitis of the right foot.  He does have a history of severe peripheral arterial disease.  Plan from infectious disease wise for long-term antibiotics and wound care.  Family states the wound has been getting worse at the right second toe amputation site.  Patient denies any nausea vomiting fever or chills at current time  Past Medical History:  Diagnosis Date   Acute metabolic encephalopathy 9/44/9675   Arthritis    Diabetes mellitus    HTN (hypertension)    Hypercholesteremia    Iron deficiency    Pancreatitis, acute 06/02/2012   Diagnosed 05/2012    Personal history of colonic adenomas 04/15/2013   04/15/2013 2 diminutive polyps     Stroke ([place])    TIA (transient ischemic attack)     No Known Allergies  ROS: Negative except as per HPI  above  Objective:  General: AAO x3, NAD  Dermatological: Ulceration at site of prior second toe amputation deep to second metatarsal head positive probe to bone fibrotic tissue present throughout the wound base.  No active drainage.  No bleeding noted from the wound wound margins upon debridement.  Minimal malodor.  No significant erythema crepitus fluctuance or concern for tracking infection.  Vascular:  Dorsalis Pedis artery and Posterior Tibial artery pedal pulses nonpalpable on the right foot.    Neruologic: Grossly i diminished via light touch and protective threshold diminished to all sites bilateral.   Musculoskeletal: Status post right second toe amputation  Gait: Unassisted, Nonantalgic.     Radiographs:  X-ray deferred at this visit.  Assessment:   1. Ulcer of right foot, with necrosis of bone ([place])   2. Osteomyelitis of foot, right, acute ([place])   3. PAD (peripheral artery disease) ([place])   4. Type 2 diabetes mellitus with stage 3b chronic kidney disease, with long-term current use of insulin ([place])      Plan:  Patient was evaluated and treated and all questions answered.  #Second toe amputation site dehiscence with ulceration to the level of bone with necrosis of the second metatarsal head and residual osteomyelitis of the right foot #Peripheral arterial disease right lower extremity -Had an extensive discussion with both the patient and his daughter present in the exam room.  Discussed possible treatment options including conservative versus surgical. -  Conservatively could proceed with long-term wound care per the wound care center as well as long-term antibiotic therapy per infectious disease.  As it is right now he does not have any sign of cellulitis right foot, abscess or tracking infection however given the MRI findings as well as the clinical findings he very likely does have osteomyelitis of at least the second metatarsal head and I would be suspicious for the third  metatarsal head as well. -The surgical treatment option would include transmetatarsal amputation of the right foot.  However I am not confident that he would heal the amputation level effectively given his severe peripheral arterial disease.  I would recommend we refer the patient back to [name] to see if he feels anything else can be done to improve right lower extremity arterial flow.  If he feels that that the patient would be able to heal a transmetatarsal amputation we could consider proceeding with this. -However the daughter also expressed interest in palliative care referral and asked if I can place that for him as well.  I said that I could and then ongoing wound care or antibiotic therapy should not preclude him from getting palliative or hospice type care if that is what she is interested in. -For the time being continue with antibiotic therapy per infectious disease -Continue with daily local wound care per wound care center recommendations.  I repacked the wound with Betadine soaked gauze.  Recommend ongoing Betadine gauze dressings including packing into the cavity present at the second toe amputation site. -Patient will follow-up with me in 2 weeks for ongoing discussion about further treatment options.  Return in about 2 weeks (around 01/11/2022) for R 2nd amp site ulcer, OM R foot.          Justin [name], DPM [facility]

## 2021-12-29 NOTE — Progress Notes (Signed)
Justin [name] ([id number]) . Visit Report for 12/25/2021 Arrival Information Details Patient Name: Date of Service: [name], [name] 12/25/2021 3:30 PM Medical Record Number: [id number] Patient Account Number: [id number] Date of Birth/Sex: Treating RN: Nov 16, 1934 (87 y.o. Justin [name] Primary Care Giulianna Rocha: Center, Romelle Starcher Other Clinician: Donavan Burnet Referring Erma Joubert: Treating Adamari Frede/Extender: [facility], Jairo Ben in Treatment: 0 Visit Information History Since Last Visit All ordered tests and consults were completed: Yes Patient Arrived: Wheel Chair Added or deleted any medications: No Arrival Time: 16:10 Any new allergies or adverse reactions: No Accompanied By: granddaughter Had a fall or experienced change in No Transfer Assistance: None activities of daily living that may affect Patient Identification Verified: Yes risk of falls: Secondary Verification Process Completed: Yes Signs or symptoms of abuse/neglect since last visito No Patient Requires Transmission-Based Precautions: No Hospitalized since last visit: No Patient Has Alerts: Yes Implantable device outside of the clinic excluding No Patient Alerts: R ABI =.75, TBI= .34 cellular tissue based products placed in the center L ABI= .86, TBI= 1.02 since last visit: Pain Present Now: No Electronic Signature(s) Signed: 12/29/2021 2:24:36 PM By: Donavan Burnet CHT EMT BS , , Entered By: Donavan Burnet on 12/25/2021 16:11:39 -------------------------------------------------------------------------------- Encounter Discharge Information Details Patient Name: Date of Service: Justin Staggers [name]. 12/25/2021 3:30 PM Medical Record Number: [id number] Patient Account Number: [id number] Date of Birth/Sex: Treating RN: 1934-11-18 (87 y.o. Justin [name] Primary Care Elora Wolter: Center, Romelle Starcher Other Clinician: Referring Laynee Lockamy: Treating Kathe Wirick/Extender: [facility], Bethany Weeks in Treatment: 0 Encounter Discharge Information Items Post Procedure Vitals Discharge Condition: Stable Temperature (F): 98.6 Ambulatory Status: Wheelchair Pulse (bpm): 89 Discharge Destination: [place] Respiratory Rate (breaths/min): 18 Telephoned: No Blood Pressure (mmHg): 144/70 Orders Sent: Yes Transportation: Other Accompanied By: granddaughter Schedule Follow-up Appointment: Yes Clinical Summary of Care: Patient Declined Notes facility transportation Electronic Signature(s) Signed: 12/25/2021 6:17:04 PM By: Baruch Gouty RN, BSN Entered By: Baruch Gouty on 12/25/2021 17:16:02 -------------------------------------------------------------------------------- Lower Extremity Assessment Details Patient Name: Date of Service: Justin Staggers [name]. 12/25/2021 3:30 PM Medical Record Number: [id number] Patient Account Number: [id number] Date of Birth/Sex: Treating RN: 07-21-1934 (87 y.o. Justin [name] Primary Care Santhiago Collingsworth: [place], [place] Other Clinician: Referring Juda Toepfer: Treating Grissel Tyrell/Extender: [facility], Bethany Weeks in Treatment: 0 Edema Assessment Assessed: [Left: No] [Right: No] Edema: [Left: N] [Right: o] Calf Left: Right: Point of Measurement: From Medial Instep 31 cm Ankle Left: Right: Point of Measurement: From Medial Instep 20.5 cm Vascular Assessment Pulses: Dorsalis Pedis Palpable: [Right:Yes] Electronic Signature(s) Signed: 12/25/2021 6:17:04 PM By: Baruch Gouty RN, BSN Entered By: Baruch Gouty on 12/25/2021 16:20:54 -------------------------------------------------------------------------------- Multi Wound Chart Details Patient Name: Date of Service: Justin Staggers [name]. 12/25/2021 3:30 PM Medical Record Number: [id number] Patient Account Number: [id number] Date of Birth/Sex: Treating RN: 1934-05-21 (87 y.o. Justin [name] Primary Care Brittne Kawasaki: Center,  Romelle Starcher Other Clinician: Referring Amaree Leeper: Treating Wilhelmina Hark/Extender: [facility], Bethany Weeks in Treatment: 0 Vital Signs Height(in): 72 Pulse(bpm): 58 Weight(lbs): 146 Blood Pressure(mmHg): 144/70 Body Mass Index(BMI): 19.8 Temperature(F): 98.6 Respiratory Rate(breaths/min): 18 Photos: [1:Right Amputation Site - [name]] [2:Right [name]] [N/A:N/A N/A] Wound Location: [1:Surgical Injury] [2:Gradually Appeared] [N/A:N/A] Wounding Event: [1:Diabetic Wound/Ulcer of the Lower] [2:Diabetic Wound/Ulcer of the Lower] [N/A:N/A] Primary Etiology: [1:Extremity Open Surgical Wound] [2:Extremity Arterial Insufficiency Ulcer] [N/A:N/A] Secondary Etiology: [1:Anemia, Coronary Artery Disease,] [2:Anemia, Coronary Artery Disease,] [N/A:N/A] Comorbid History: [1:Hypertension, Peripheral Arterial Disease, Type II Diabetes, Osteoarthritis, Anorexia/bulimia 11/26/2021] [  2:Hypertension, Peripheral Arterial Disease, Type II Diabetes, Osteoarthritis, Anorexia/bulimia 11/26/2021] [N/A:N/A] Date Acquired: [1:0] [2:0] [N/A:N/A] Weeks of Treatment: [1:Open] [2:Open] [N/A:N/A] Wound Status: [1:No] [2:No] [N/A:N/A] Wound Recurrence: [1:2.8x1.8x2.3] [2:0.8x0.8x0.1] [N/A:N/A] Measurements L x W x D (cm) [1:3.958] [2:0.503] [N/A:N/A] A (cm) : rea [1:9.104] [2:0.05] [N/A:N/A] Volume (cm) : [1:-20.00%] [2:28.90%] [N/A:N/A] % Reduction in A [1:rea: -31.40%] [2:29.60%] [N/A:N/A] % Reduction in Volume: [1:Grade 3] [2:Grade 3] [N/A:N/A] Classification: [1:Medium] [2:Small] [N/A:N/A] Exudate A mount: [1:Serosanguineous] [2:Serosanguineous] [N/A:N/A] Exudate Type: [1:red, brown] [2:red, brown] [N/A:N/A] Exudate Color: [1:Distinct, outline attached] [2:Flat and Intact] [N/A:N/A] Wound Margin: [1:None Present (0%)] [2:None Present (0%)] [N/A:N/A] Granulation A mount: [1:Large (67-100%)] [2:Large (67-100%)] [N/A:N/A] Necrotic A mount: [1:Fat Layer (Subcutaneous Tissue): Yes Fat Layer (Subcutaneous  Tissue): Yes N/A] Exposed Structures: [1:Bone: Yes Fascia: No Tendon: No Muscle: No Joint: No Small (1-33%)] [2:Bone: Yes Fascia: No Tendon: No Muscle: No Joint: No Small (1-33%)] [N/A:N/A] Epithelialization: [1:Debridement - Excisional] [2:Debridement - Selective/Open Wound N/A] Debridement: Pre-procedure Verification/Time Out 16:40 [2:16:40] [N/A:N/A] Taken: [1:Lidocaine 4% Topical Solution] [2:Lidocaine 4% Topical Solution] [N/A:N/A] Pain Control: [1:Fat, Subcutaneous, Slough] [2:Slough] [N/A:N/A] Tissue Debrided: [1:Skin/Subcutaneous Tissue] [2:Non-Viable Tissue] [N/A:N/A] Level: [1:5.04] [2:0.64] [N/A:N/A] Debridement A (sq cm): [1:rea Curette, Forceps, Scissors] [2:Curette] [N/A:N/A] Instrument: [1:Minimum] [2:Minimum] [N/A:N/A] Bleeding: [1:Pressure] [2:Pressure] [N/A:N/A] Hemostasis A chieved: [1:0] [2:0] [N/A:N/A] Procedural Pain: [1:0] [2:0] [N/A:N/A] Post Procedural Pain: [1:Procedure was tolerated well] [2:Procedure was tolerated well] [N/A:N/A] Debridement Treatment Response: [1:2.8x1.8x2.3] [2:0.8x0.8x0.1] [N/A:N/A] Post Debridement Measurements L x W x D (cm) [1:9.104] [2:0.05] [N/A:N/A] Post Debridement Volume: (cm) [1:Debridement] [2:Debridement] [N/A:N/A] Treatment Notes Electronic Signature(s) Signed: 12/25/2021 5:10:30 PM By: Fredirick Maudlin MD FACS Signed: 12/25/2021 6:17:04 PM By: Baruch Gouty RN, BSN Entered By: Fredirick Maudlin on 12/25/2021 17:10:30 -------------------------------------------------------------------------------- Multi-Disciplinary Care Plan Details Patient Name: Date of Service: Justin Staggers [name]. 12/25/2021 3:30 PM Medical Record Number: [id number] Patient Account Number: [id number] Date of Birth/Sex: Treating RN: 30-Mar-1935 (87 y.o. Justin [name] Primary Care Deetta Siegmann: Center, Romelle Starcher Other Clinician: Referring Catalea Labrecque: Treating Aleah Ahlgrim/Extender: [facility], Jairo Ben in Treatment: 0 Multidisciplinary Care  Plan reviewed with physician Active Inactive Nutrition Nursing Diagnoses: Impaired glucose control: actual or potential Potential for alteratiion in Nutrition/Potential for imbalanced nutrition Goals: Patient/caregiver will maintain therapeutic glucose control Date Initiated: 12/19/2021 Target Resolution Date: 01/16/2022 Goal Status: Active Interventions: Assess HgA1c results as ordered upon admission and as needed Assess patient nutrition upon admission and as needed per policy Provide education on elevated blood sugars and impact on wound healing Treatment Activities: Patient referred to Primary Care Physician for further nutritional evaluation : 12/19/2021 Notes: Osteomyelitis Nursing Diagnoses: Infection: osteomyelitis Knowledge deficit related to disease process and management Goals: Patient/caregiver will verbalize understanding of disease process and disease management Date Initiated: 12/19/2021 Target Resolution Date: 01/16/2022 Goal Status: Active Patient's osteomyelitis will resolve Date Initiated: 12/19/2021 Target Resolution Date: 01/16/2022 Goal Status: Active Interventions: Assess for signs and symptoms of osteomyelitis resolution every visit Provide education on osteomyelitis Treatment Activities: Consult for HBO : 12/19/2021 Surgical debridement : 12/19/2021 Systemic antibiotics : 12/19/2021 [name] ordered outside of clinic : 12/19/2021 est Notes: Wound/Skin Impairment Nursing Diagnoses: Impaired tissue integrity Knowledge deficit related to ulceration/compromised skin integrity Goals: Patient/caregiver will verbalize understanding of skin care regimen Date Initiated: 12/19/2021 Target Resolution Date: 01/16/2022 Goal Status: Active Ulcer/skin breakdown will have a volume reduction of 30% by week 4 Date Initiated: 12/19/2021 Target Resolution Date: 01/16/2022 Goal Status: Active Interventions: Assess patient/caregiver ability to obtain necessary  supplies Assess patient/caregiver ability to perform ulcer/skin care  regimen upon admission and as needed Assess ulceration(s) every visit Provide education on ulcer and skin care Treatment Activities: Skin care regimen initiated : 12/19/2021 Topical wound management initiated : 12/19/2021 Notes: Electronic Signature(s) Signed: 12/25/2021 6:17:04 PM By: Baruch Gouty RN, BSN Entered By: Baruch Gouty on 12/25/2021 16:26:06 -------------------------------------------------------------------------------- Pain Assessment Details Patient Name: Date of Service: Justin Staggers [name]. 12/25/2021 3:30 PM Medical Record Number: [id number] Patient Account Number: [id number] Date of Birth/Sex: Treating RN: Sep 07, 1934 (87 y.o. Justin [name] Primary Care Deloras Reichard: [place] Other Clinician: Referring Haydin Calandra: Treating Ashleymarie Granderson/Extender: [facility], Bethany Weeks in Treatment: 0 Active Problems Location of Pain Severity and Description of Pain Patient Has Paino No Site Locations Rate the pain. Current Pain Level: 0 Pain Management and Medication Current Pain Management: Electronic Signature(s) Signed: 12/25/2021 6:17:04 PM By: Baruch Gouty RN, BSN Entered By: Baruch Gouty on 12/25/2021 16:16:43 -------------------------------------------------------------------------------- Patient/Caregiver Education Details Patient Name: Date of Service: RO Frederich Balding 9/18/2023andnbsp3:30 PM Medical Record Number: [id number] Patient Account Number: [id number] Date of Birth/Gender: Treating RN: [birth year] (87 y.o. Justin [name] Primary Care Physician: [place], [place] Other Clinician: Referring Physician: Treating Physician/Extender: [facility], Jairo Ben in Treatment: 0 Education Assessment Education Provided To: Patient Education Topics Provided Elevated Blood Sugar/ Impact on Healing: Methods: Explain/Verbal Responses: Reinforcements  needed, State content correctly Infection: Methods: Explain/Verbal Responses: Reinforcements needed, State content correctly Wound/Skin Impairment: Methods: Explain/Verbal Responses: Reinforcements needed, State content correctly Electronic Signature(s) Signed: 12/25/2021 6:17:04 PM By: Baruch Gouty RN, BSN Entered By: Baruch Gouty on 12/25/2021 16:26:30 -------------------------------------------------------------------------------- Wound Assessment Details Patient Name: Date of Service: Justin Staggers [name]. 12/25/2021 3:30 PM Medical Record Number: [id number] Patient Account Number: [id number] Date of Birth/Sex: Treating RN: 08-17-1934 (87 y.o. Justin [name] Primary Care Landen Knoedler: [place], [place] Other Clinician: Referring Kambrea Carrasco: Treating Neyah Ellerman/Extender: [facility], Bethany Weeks in Treatment: 0 Wound Status Wound Number: 1 Primary Diabetic Wound/Ulcer of the Lower Extremity Etiology: Wound Location: Right Amputation Site - Toe Secondary Open Surgical Wound Wounding Event: Surgical Injury Etiology: Date Acquired: 11/26/2021 Wound Open Weeks Of Treatment: 0 Status: Clustered Wound: No Comorbid Anemia, Coronary Artery Disease, Hypertension, Peripheral History: Arterial Disease, Type II Diabetes, Osteoarthritis, Anorexia/bulimia Photos Wound Measurements Length: (cm) 2.8 Width: (cm) 1.8 Depth: (cm) 2.3 Area: (cm) 3.958 Volume: (cm) 9.104 % Reduction in Area: -20% % Reduction in Volume: -31.4% Epithelialization: Small (1-33%) Tunneling: No Undermining: No Wound Description Classification: Grade 3 Wound Margin: Distinct, outline attached Exudate Amount: Medium Exudate Type: Serosanguineous Exudate Color: red, brown Foul Odor After Cleansing: No Slough/Fibrino Yes Wound Bed Granulation Amount: None Present (0%) Exposed Structure Necrotic Amount: Large (67-100%) Fascia Exposed: No Necrotic Quality: Adherent Slough Fat Layer  (Subcutaneous Tissue) Exposed: Yes Tendon Exposed: No Muscle Exposed: No Joint Exposed: No Bone Exposed: Yes Treatment Notes Wound #1 (Amputation Site - Toe) Wound Laterality: Right Cleanser Soap and Water Discharge Instruction: May shower and wash wound with dial antibacterial soap and water prior to dressing change. Wound Cleanser Discharge Instruction: Cleanse the wound with wound cleanser prior to applying a clean dressing using gauze sponges, not tissue or cotton balls. Peri-Wound Care Sween Lotion (Moisturizing lotion) Discharge Instruction: Apply moisturizing lotion to both feet with dressing changes Topical Primary Dressing KerraCel Ag Gelling Fiber Dressing, 2x2 in (silver alginate) Discharge Instruction: Apply silver alginate to wound bed pack lightly into wound bed Iodosorb Gel 10 (gm) Tube Discharge Instruction: Apply to bottom of wound bed, silver alginate to fill dead space Secondary Dressing  Woven Gauze Sponge, Non-Sterile 4x4 in Discharge Instruction: Apply over primary dressing as directed. Secured With [employer], 4.5x3.1 (in/yd) Discharge Instruction: Secure with Kerlix as directed. 67M Medipore H Soft Cloth Surgical [name] ape, 4 x 10 (in/yd) Discharge Instruction: Secure with tape as directed. Compression Wrap Compression Stockings Add-Ons Electronic Signature(s) Signed: 12/25/2021 6:17:04 PM By: Baruch Gouty RN, BSN Signed: 12/29/2021 2:24:36 PM By: Donavan Burnet CHT EMT BS , , Entered By: Donavan Burnet on 12/25/2021 16:24:50 -------------------------------------------------------------------------------- Wound Assessment Details Patient Name: Date of Service: Justin Staggers [name]. 12/25/2021 3:30 PM Medical Record Number: [id number] Patient Account Number: [id number] Date of Birth/Sex: Treating RN: Jul 23, 1934 (87 y.o. Justin [name] Primary Care Encarnacion Scioneaux: [place], [place] Other Clinician: Referring Gerber Penza: Treating  Deryl Giroux/Extender: [facility], Bethany Weeks in Treatment: 0 Wound Status Wound Number: 2 Primary Diabetic Wound/Ulcer of the Lower Extremity Etiology: Wound Location: Right [name] Secondary Arterial Insufficiency Ulcer Wounding Event: Gradually Appeared Etiology: Date Acquired: 11/26/2021 Wound Open Weeks Of Treatment: 0 Status: Clustered Wound: No Comorbid Anemia, Coronary Artery Disease, Hypertension, Peripheral History: Arterial Disease, Type II Diabetes, Osteoarthritis, Anorexia/bulimia Photos Wound Measurements Length: (cm) 0.8 Width: (cm) 0.8 Depth: (cm) 0.1 Area: (cm) 0.503 Volume: (cm) 0.05 % Reduction in Area: 28.9% % Reduction in Volume: 29.6% Epithelialization: Small (1-33%) Tunneling: No Undermining: No Wound Description Classification: Grade 3 Wound Margin: Flat and Intact Exudate Amount: Small Exudate Type: Serosanguineous Exudate Color: red, brown Foul Odor After Cleansing: No Slough/Fibrino No Wound Bed Granulation Amount: None Present (0%) Exposed Structure Necrotic Amount: Large (67-100%) Fascia Exposed: No Necrotic Quality: Adherent Slough Fat Layer (Subcutaneous Tissue) Exposed: Yes Tendon Exposed: No Muscle Exposed: No Joint Exposed: No Bone Exposed: Yes Treatment Notes Wound #2 (Toe Third) Wound Laterality: Right Cleanser Soap and Water Discharge Instruction: May shower and wash wound with dial antibacterial soap and water prior to dressing change. Wound Cleanser Discharge Instruction: Cleanse the wound with wound cleanser prior to applying a clean dressing using gauze sponges, not tissue or cotton balls. Peri-Wound Care Sween Lotion (Moisturizing lotion) Discharge Instruction: Apply moisturizing lotion to both feet with dressing changes Topical Primary Dressing Iodosorb Gel 10 (gm) Tube Discharge Instruction: Apply to wound bed as instructed Secondary Dressing Woven Gauze Sponge, Non-Sterile 4x4 in Discharge  Instruction: Apply over primary dressing as directed. Secured With [employer], 4.5x3.1 (in/yd) Discharge Instruction: Secure with Kerlix as directed. 67M Medipore H Soft Cloth Surgical [name] ape, 4 x 10 (in/yd) Discharge Instruction: Secure with tape as directed. Compression Wrap Compression Stockings Add-Ons Electronic Signature(s) Signed: 12/25/2021 6:17:04 PM By: Baruch Gouty RN, BSN Signed: 12/29/2021 2:24:36 PM By: Donavan Burnet CHT EMT BS , , Entered By: Donavan Burnet on 12/25/2021 16:25:43 -------------------------------------------------------------------------------- Vitals Details Patient Name: Date of Service: Justin Staggers [name]. 12/25/2021 3:30 PM Medical Record Number: [id number] Patient Account Number: [id number] Date of Birth/Sex: Treating RN: 07-Aug-1934 (87 y.o. Justin [name] Primary Care Raymondo Garcialopez: [place], [place] Other Clinician: Referring Kaivon Livesey: Treating Elda Dunkerson/Extender: [facility], Bethany Weeks in Treatment: 0 Vital Signs Time Taken: 16:10 Temperature (F): 98.6 Height (in): 72 Pulse (bpm): 89 Weight (lbs): 146 Respiratory Rate (breaths/min): 18 Body Mass Index (BMI): 19.8 Blood Pressure (mmHg): 144/70 Reference Range: 80 - 120 mg / dl Electronic Signature(s) Signed: 12/29/2021 2:24:36 PM By: Donavan Burnet CHT EMT BS , , Entered By: Donavan Burnet on 12/25/2021 16:13:23

## 2022-01-01 ENCOUNTER — Encounter (HOSPITAL_BASED_OUTPATIENT_CLINIC_OR_DEPARTMENT_OTHER): Payer: Medicare Other | Admitting: General Surgery

## 2022-01-01 DIAGNOSIS — E1142 Type 2 diabetes mellitus with diabetic polyneuropathy: Secondary | ICD-10-CM | POA: Diagnosis not present

## 2022-01-01 NOTE — Progress Notes (Signed)
ERROLL, WILBOURNE T ([id number]) . Visit Report for 01/01/2022 Chief Complaint Document Details Patient Name: Date of Service: Justin [name], Justin [name] 01/01/2022 8:30 A M Medical Record Number: [id number] Patient Account Number: [id number] Date of Birth/Sex: Treating RN: 30-Dec-1934 (87 y.o. Janyth Contes Primary Care Provider: Riner Other Clinician: Referring Provider: Treating Provider/Extender: Fredirick [facility], Bethany Weeks in Treatment: 1 Information Obtained from: Patient Chief Complaint Patients presents for treatment of open diabetic ulcers and dehiscence of surgical site Electronic Signature(s) Signed: 01/01/2022 9:22:53 AM By: Fredirick Maudlin MD FACS Entered By: Fredirick Maudlin on 01/01/2022 09:22:53 -------------------------------------------------------------------------------- Debridement Details Patient Name: Date of Service: Justin Staggers T. 01/01/2022 8:30 A M Medical Record Number: [id number] Patient Account Number: [id number] Date of Birth/Sex: Treating RN: 1935/02/05 (87 y.o. Janyth Contes Primary Care Provider: Center, Romelle Starcher Other Clinician: Referring Provider: Treating Provider/Extender: Fredirick [facility], Bethany Weeks in Treatment: 1 Debridement Performed for Assessment: Wound #1 Right Amputation Site - Toe Performed By: Physician Fredirick Maudlin, MD Debridement Type: Debridement Severity of Tissue Pre Debridement: Fat layer exposed Level of Consciousness (Pre-procedure): Awake and Alert Pre-procedure Verification/Time Out Yes - 09:04 Taken: Start Time: 09:04 Pain Control: Lidocaine 4% T opical Solution T Area Debrided (L x W): otal 2.3 (cm) x 1.3 (cm) = 2.99 (cm) Tissue and other material debrided: Non-Viable, Slough, Subcutaneous, Slough Level: Skin/Subcutaneous Tissue Debridement Description: Excisional Instrument: Curette Bleeding: Minimum Hemostasis Achieved: Pressure Procedural Pain: 4 Post Procedural  Pain: 0 Response to Treatment: Procedure was tolerated well Level of Consciousness (Post- Awake and Alert procedure): Post Debridement Measurements of Total Wound Length: (cm) 2.3 Width: (cm) 1.3 Depth: (cm) 1.2 Volume: (cm) 2.818 Character of Wound/Ulcer Post Debridement: Improved Severity of Tissue Post Debridement: Fat layer exposed Post Procedure Diagnosis Same as Pre-procedure Notes scribed for Dr. Celine Ahr by Isaac Bliss, RN Electronic Signature(s) Signed: 01/01/2022 9:47:33 AM By: Fredirick Maudlin MD FACS Signed: 01/01/2022 4:46:26 PM By: Adline Peals Entered By: Adline Peals on 01/01/2022 09:05:37 -------------------------------------------------------------------------------- Debridement Details Patient Name: Date of Service: Justin Staggers T. 01/01/2022 8:30 A M Medical Record Number: [id number] Patient Account Number: [id number] Date of Birth/Sex: Treating RN: 05-05-1934 (87 y.o. Janyth Contes Primary Care Provider: Center, Romelle Starcher Other Clinician: Referring Provider: Treating Provider/Extender: [facility], Bethany Weeks in Treatment: 1 Debridement Performed for Assessment: Wound #2 Right T Third oe Performed By: Physician Fredirick Maudlin, MD Debridement Type: Debridement Severity of Tissue Pre Debridement: Fat layer exposed Level of Consciousness (Pre-procedure): Awake and Alert Pre-procedure Verification/Time Out Yes - 09:04 Taken: Start Time: 09:04 Pain Control: Lidocaine 4% T opical Solution T Area Debrided (L x W): otal 0.7 (cm) x 0.7 (cm) = 0.49 (cm) Tissue and other material debrided: Non-Viable, Slough, Subcutaneous, Slough Level: Skin/Subcutaneous Tissue Debridement Description: Excisional Instrument: Curette Bleeding: Minimum Hemostasis Achieved: Pressure Procedural Pain: 4 Post Procedural Pain: 0 Response to Treatment: Procedure was tolerated well Level of Consciousness (Post- Awake and  Alert procedure): Post Debridement Measurements of Total Wound Length: (cm) 0.7 Width: (cm) 0.7 Depth: (cm) 0.1 Volume: (cm) 0.038 Character of Wound/Ulcer Post Debridement: Improved Severity of Tissue Post Debridement: Fat layer exposed Post Procedure Diagnosis Same as Pre-procedure Notes scribed for Dr. Celine Ahr by Isaac Bliss, RN Electronic Signature(s) Signed: 01/01/2022 9:47:33 AM By: Fredirick Maudlin MD FACS Signed: 01/01/2022 4:46:26 PM By: Adline Peals Entered By: Adline Peals on 01/01/2022 09:06:22 -------------------------------------------------------------------------------- HPI Details Patient Name: Date of Service: Justin Staggers T. 01/01/2022 8:30 A M Medical Record Number: [id number]  Patient Account Number: [id number] Date of Birth/Sex: Treating RN: July 19, 1934 (87 y.o. Janyth Contes Primary Care Provider: Center, Romelle Starcher Other Clinician: Referring Provider: Treating Provider/Extender: Fredirick [facility], Bethany Weeks in Treatment: 1 History of Present Illness HPI Description: ADMISSION 12/19/2021 This is an 87 year old man with multiple medical problems including type 2 diabetes mellitus, chronic pancreatitis, coronary artery disease, peripheral vascular disease, stroke and TIA. He was admitted to the hospital on November 15, 2021 with a diabetic foot infection. An MRI was performed on admission that was questionable for early osteomyelitis. He was seen in consultation by both podiatry and vascular surgery. Due to limb threatening ischemia, he underwent a lower extremity angiogram with multiple angioplasties on August 18. He then had amputation of his right second toe on August 20. There was frank pus present which was cultured. He was placed on a course of oral antibiotics. He subsequently was discharged but returned due to altered mental status on September 2. Repeat MRI was performed that was again concerning for osteomyelitis.  Infectious disease was consulted. A 4-week course of oral doxycycline and cefadroxil was prescribed. Podiatry referred him to the wound care center for further evaluation and management. On exam, there is a recent right second toe amputation site. The Prolene sutures are gaping and the wound has a fairly deep cavity; it does probe to bone. There is no purulent drainage or odor, but there is quite a bit of fat necrosis present. On the lateral aspect of his third toe, there is an ulcer that has a leathery eschar overlying it. Underneath, bone and tendon are exposed. No purulent drainage or malodor from this wound, either. 12/25/2021: There is still extensive slough and fat necrosis in the right second toe amputation site. The lateral third toe wound has also accumulated quite a bit of slough. Today he did not have the prescribed dressing in situ, just a bit of gauze over the wound sites. 01/01/2022: Patient's facility paperwork today indicates that they are requesting a consultation for possible hyperbaric oxygen therapy. Unfortunately there are no other notes available to me from whomever made this request. There is still substantial slough and nonviable subcutaneous tissue present in both of the wounds. There is bone exposed in both sites. He continues on oral antibiotics per infectious disease. Electronic Signature(s) Signed: 01/01/2022 9:25:10 AM By: Fredirick Maudlin MD FACS Entered By: Fredirick Maudlin on 01/01/2022 09:25:09 -------------------------------------------------------------------------------- Physical Exam Details Patient Name: Date of Service: Justin Staggers T. 01/01/2022 8:30 A M Medical Record Number: [id number] Patient Account Number: [id number] Date of Birth/Sex: Treating RN: Aug 30, 1934 (87 y.o. Janyth Contes Primary Care Provider: Albers Other Clinician: Referring Provider: Treating Provider/Extender: Bernell List, Bethany Weeks in Treatment:  1 Constitutional . . . . No acute distress.Marland Kitchen Respiratory Normal work of breathing on room air.. Notes 01/01/2022: There is still substantial slough and nonviable subcutaneous tissue present in both of the wounds. There is bone exposed in both sites. Electronic Signature(s) Signed: 01/01/2022 9:26:04 AM By: Fredirick Maudlin MD FACS Entered By: Fredirick Maudlin on 01/01/2022 09:26:04 -------------------------------------------------------------------------------- Physician Orders Details Patient Name: Date of Service: Justin Staggers T. 01/01/2022 8:30 A M Medical Record Number: [id number] Patient Account Number: [id number] Date of Birth/Sex: Treating RN: [birth year] (87 y.o. Janyth Contes Primary Care Provider: Hollins Other Clinician: Referring Provider: Treating Provider/Extender: Bernell List, Bethany Weeks in Treatment: 1 Verbal / Phone Orders: No Diagnosis Coding ICD-10 Coding Code Description E11.42 Type 2 diabetes mellitus with diabetic polyneuropathy  N18.32 Chronic kidney disease, stage 3b I73.9 Peripheral vascular disease, unspecified I25.10 Atherosclerotic heart disease of native coronary artery without angina pectoris I10 Essential (primary) hypertension K86.1 Other chronic pancreatitis E11.621 Type 2 diabetes mellitus with foot ulcer T81.31XS Disruption of external operation (surgical) wound, not elsewhere classified, sequela 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 - Dr. Celine Ahr RM 1 with Vaughan Basta Return A Anesthetic Wound #1 Right Amputation Site - Toe (In clinic) Topical Lidocaine 4% applied to wound bed Wound #2 Right T Third oe (In clinic) Topical Lidocaine 4% applied to wound  bed Bathing/ Shower/ Hygiene Other Bathing/Shower/Hygiene Orders/Instructions: - may wash feet with soap and water with dressing changes Off-Loading Heel suspension boot to: - podus boots bilateral feet Hyperbaric Oxygen Therapy Evaluate for HBO Therapy Wound Treatment Wound #1 - Amputation Site - Toe Wound Laterality: Right Cleanser: Soap and Water 1 x Per Day/30 Days Discharge Instructions: May shower and wash wound with dial antibacterial soap and water prior to dressing change. Cleanser: Wound Cleanser 1 x Per Day/30 Days Discharge Instructions: Cleanse the wound with wound cleanser prior to applying a clean dressing using gauze sponges, not tissue or cotton balls. Peri-Wound Care: Sween Lotion (Moisturizing lotion) 1 x Per Day/30 Days Discharge Instructions: Apply moisturizing lotion to both feet with dressing changes Prim Dressing: Iodosorb Gel 10 (gm) Tube 1 x Per Day/30 Days ary Discharge Instructions: Apply to bottom of wound bed, silver alginate to fill dead space Secondary Dressing: Woven Gauze Sponge, Non-Sterile 4x4 in 1 x Per Day/30 Days Discharge Instructions: Moisten with saline and apply over primary dressing as directed. Secured With: [employer], 4.5x3.1 (in/yd) 1 x Per Day/30 Days Discharge Instructions: Secure with Kerlix as directed. Secured With: 6M Medipore H Soft Cloth Surgical T ape, 4 x 10 (in/yd) 1 x Per Day/30 Days Discharge Instructions: Secure with tape as directed. Wound #2 - T Third oe Wound Laterality: Right Cleanser: Soap and Water 1 x Per Day/30 Days Discharge Instructions: May shower and wash wound with dial antibacterial soap and water prior to dressing change. Cleanser: Wound Cleanser 1 x Per Day/30 Days Discharge Instructions: Cleanse the wound with wound cleanser prior to applying a clean dressing using gauze sponges, not tissue or cotton balls. Peri-Wound Care: Sween Lotion (Moisturizing lotion) 1 x Per Day/30 Days Discharge  Instructions: Apply moisturizing lotion to both feet with dressing changes Prim Dressing: Iodosorb Gel 10 (gm) Tube 1 x Per Day/30 Days ary Discharge Instructions: Apply to wound bed as instructed Secondary Dressing: Woven Gauze Sponge, Non-Sterile 4x4 in 1 x Per Day/30 Days Discharge Instructions: Moisten with saline and apply over primary dressing as directed. Secured With: [employer], 4.5x3.1 (in/yd) 1 x Per Day/30 Days Discharge Instructions: Secure with Kerlix as directed. Secured With: 6M Medipore H Soft Cloth Surgical T ape, 4 x 10 (in/yd) 1 x Per Day/30 Days Discharge Instructions: Secure with tape as directed. Patient Medications llergies: No Known Allergies A Notifications Medication Indication Start End 01/01/2022 lidocaine DOSE topical 4 % cream - cream topical every week Electronic Signature(s) Signed: 01/01/2022 9:47:33 AM By: Fredirick Maudlin MD FACS Entered By: Fredirick Maudlin on 01/01/2022 09:26:17 -------------------------------------------------------------------------------- Problem List Details Patient Name: Date of Service: Justin Staggers T. 01/01/2022 8:30 A M Medical Record Number: [id number]  Patient Account Number: [id number] Date of Birth/Sex: Treating RN: 26-Feb-1935 (87 y.o. Janyth Contes Primary Care Provider: Center, Romelle Starcher Other Clinician: Referring Provider: Treating Provider/Extender: Fredirick M[facility], Bethany Weeks in Treatment: 1 Active Problems ICD-10 Encounter Code Description Active Date MDM Diagnosis E11.42 Type 2 diabetes mellitus with diabetic polyneuropathy 12/19/2021 No Yes N18.32 Chronic kidney disease, stage 3b 12/19/2021 No Yes I73.9 Peripheral vascular disease, unspecified 12/19/2021 No Yes I25.10 Atherosclerotic heart disease of native coronary artery without angina pectoris 12/19/2021 No Yes I10 Essential (primary) hypertension 12/19/2021 No Yes K86.1 Other chronic pancreatitis 12/19/2021 No Yes E11.621  Type 2 diabetes mellitus with foot ulcer 12/19/2021 No Yes T81.31XS Disruption of external operation (surgical) wound, not elsewhere classified, 12/19/2021 No Yes sequela L97.514 Non-pressure chronic ulcer of other part of right foot with necrosis of bone 12/19/2021 No Yes L97.516 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 01/01/2022 8:30 A M Medical Record Number: [id number] Patient Account Number: [id number] Date of Birth/Sex: Treating RN: 06/20/1934 (87 y.o. Janyth Contes Primary Care Provider: Center, Romelle Starcher Other Clinician: Referring Provider: Treating Provider/Extender: Fredirick [facility], Bethany Weeks in Treatment: 1 Subjective Chief Complaint Information obtained from Patient Patients presents for treatment of open diabetic ulcers and dehiscence of surgical site History of Present Illness (HPI) ADMISSION 12/19/2021 This is an 87 year old man with multiple medical problems including type 2 diabetes mellitus, chronic pancreatitis, coronary artery disease, peripheral vascular disease, stroke and TIA. He was admitted to the hospital on November 15, 2021 with a diabetic foot infection. An MRI was performed on admission that was questionable for early osteomyelitis. He was seen in consultation by both podiatry and vascular surgery. Due to limb threatening ischemia, he underwent a lower extremity angiogram with multiple angioplasties on August 18. He then had amputation of his right second toe on August 20.  There was frank pus present which was cultured. He was placed on a course of oral antibiotics. He subsequently was discharged but returned due to altered mental status on September 2. Repeat MRI was performed that was again concerning for osteomyelitis. Infectious disease was consulted. A 4-week course of oral doxycycline and cefadroxil was prescribed. Podiatry referred him to the wound care center for further evaluation and management. On exam, there is a recent right second toe amputation site. The Prolene sutures are gaping and the wound has a fairly deep cavity; it does probe to bone. There is no purulent drainage or odor, but there is quite a bit of fat necrosis present. On the lateral aspect of his third toe, there is an ulcer that has a leathery eschar overlying it. Underneath, bone and tendon are exposed. No purulent drainage or malodor from this wound, either. 12/25/2021: There is still extensive slough and fat necrosis in the right second toe amputation site. The lateral third toe wound has also accumulated quite a bit of slough. Today he did not have the prescribed dressing in situ, just a bit of gauze over the wound sites. 01/01/2022: Patient's facility paperwork today indicates that they are requesting a consultation for possible hyperbaric oxygen therapy. Unfortunately there are no other notes available to me from whomever made this request. There  is still substantial slough and nonviable subcutaneous tissue present in both of the wounds. There is bone exposed in both sites. He continues on oral antibiotics per infectious disease. Patient History Information obtained from Patient, Chart. Family History Diabetes, Heart Disease - Mother, Hypertension - Mother,Siblings, No family history of Cancer, Hereditary Spherocytosis, Kidney Disease, Lung Disease, Seizures, Stroke, Thyroid Problems, Tuberculosis. Social History Former smoker, Marital Status - Married, Alcohol Use - Never, Drug Use -  No History, Caffeine Use - Daily - coffee. Medical History Eyes Denies history of Cataracts, Glaucoma, Optic Neuritis Hematologic/Lymphatic Patient has history of Anemia Cardiovascular Patient has history of Coronary Artery Disease, Hypertension, Peripheral Arterial Disease Endocrine Patient has history of Type II Diabetes Denies history of Type I Diabetes Genitourinary Denies history of End Stage Renal Disease Integumentary (Skin) Denies history of History of Burn Musculoskeletal Patient has history of Osteoarthritis Neurologic Denies history of Neuropathy Oncologic Denies history of Received Chemotherapy, Received Radiation Psychiatric Patient has history of Anorexia/bulimia - poor apetite Denies history of Confinement Anxiety Hospitalization/Surgery History - right 2nd toe amputation 11/26/21. - agram. - peripheral balloon angioplasty 11/24/21. - temporal artery biopsy. - back surgery. - neck surgery. - cholecystectomy. Medical A Surgical History Notes nd Gastrointestinal chronic pancreatitis Genitourinary CKD st 3, testosterone deficiency Neurologic CVA Objective Constitutional No acute distress.. Vitals Time Taken: 8:49 AM, Height: 72 in, Weight: 146 lbs, BMI: 19.8, Temperature: 98.3 F, Pulse: 89 bpm, Respiratory Rate: 18 breaths/min, Blood Pressure: 132/71 mmHg. Respiratory Normal work of breathing on room air.. General Notes: 01/01/2022: There is still substantial slough and nonviable subcutaneous tissue present in both of the wounds. There is bone exposed in both sites. Integumentary (Hair, Skin) Wound #1 status is Open. Original cause of wound was Surgical Injury. The date acquired was: 11/26/2021. The wound has been in treatment 1 weeks. The wound is located on the Right Amputation Site - T The wound measures 2.3cm length x 1.3cm width x 1.2cm depth; 2.348cm^2 area and 2.818cm^3 volume. oe. There is bone and Fat Layer (Subcutaneous Tissue) exposed. There is no  tunneling or undermining noted. There is a medium amount of serosanguineous drainage noted. The wound margin is distinct with the outline attached to the wound base. There is small (1-33%) red granulation within the wound bed. There is a large (67-100%) amount of necrotic tissue within the wound bed including Adherent Slough. Wound #2 status is Open. Original cause of wound was Gradually Appeared. The date acquired was: 11/26/2021. The wound has been in treatment 1 weeks. The wound is located on the Right T Third. The wound measures 0.7cm length x 0.7cm width x 0.1cm depth; 0.385cm^2 area and 0.038cm^3 volume. There is oe bone and Fat Layer (Subcutaneous Tissue) exposed. There is no tunneling or undermining noted. There is a medium amount of serosanguineous drainage noted. The wound margin is flat and intact. There is small (1-33%) red granulation within the wound bed. There is a large (67-100%) amount of necrotic tissue within the wound bed including Adherent Slough. 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 There was a Excisional Skin/Subcutaneous Tissue Debridement with a total  area of 2.99 sq cm performed by Fredirick Maudlin, MD. With the following instrument(s): Curette to remove Non-Viable tissue/material. Material removed includes Subcutaneous Tissue and Slough and after achieving pain control using Lidocaine 4% Topical Solution. No specimens were taken. A time out was conducted at 09:04, prior to the start of the procedure. A Minimum amount of bleeding was controlled with Pressure. The procedure was tolerated well with a pain level of 4 throughout and a pain level of 0 following the procedure. Post Debridement Measurements: 2.3cm length x 1.3cm width x 1.2cm depth; 2.818cm^3 volume. Character of Wound/Ulcer Post Debridement is improved. Severity of Tissue Post Debridement is: Fat layer exposed. Post procedure Diagnosis Wound #1: Same as Pre-Procedure General Notes: scribed for Dr. Celine Ahr by Isaac Bliss, RN. Wound #2 Pre-procedure diagnosis of Wound #2 is a Diabetic Wound/Ulcer of the Lower Extremity located on the Right T Third .Severity of Tissue Pre Debridement is: oe Fat layer exposed. There was a Excisional Skin/Subcutaneous Tissue Debridement with a total area of 0.49 sq cm performed by Fredirick Maudlin, MD. With the following instrument(s): Curette to remove Non-Viable tissue/material. Material removed includes Subcutaneous Tissue and Slough and after achieving pain control using Lidocaine 4% T opical Solution. No specimens were taken. A time out was conducted at 09:04, prior to the start of the procedure. A Minimum amount of bleeding was controlled with Pressure. The procedure was tolerated well with a pain level of 4 throughout and a pain level of 0 following the procedure. Post Debridement Measurements: 0.7cm length x 0.7cm width x 0.1cm depth; 0.038cm^3 volume. Character of Wound/Ulcer Post Debridement is improved. Severity of Tissue Post Debridement is: Fat layer exposed. Post procedure Diagnosis Wound #2: Same as Pre-Procedure General Notes: scribed  for Dr. Celine Ahr by Isaac Bliss, RN. Plan Follow-up Appointments: Return Appointment in 1 week. - Dr. Celine Ahr RM 1 with Vaughan Basta Anesthetic: Wound #1 Right Amputation Site - T oe: (In clinic) Topical Lidocaine 4% applied to wound bed Wound #2 Right T Third: oe (In clinic) Topical Lidocaine 4% applied to wound bed Bathing/ Shower/ Hygiene: Other Bathing/Shower/Hygiene Orders/Instructions: - may wash feet with soap and water with dressing changes Off-Loading: Heel suspension boot to: - podus boots bilateral feet Hyperbaric Oxygen Therapy: Evaluate for HBO Therapy The following medication(s) was prescribed: lidocaine topical 4 % cream cream topical every week was prescribed at facility WOUND #1: - Amputation Site - T oe Wound Laterality: Right Cleanser: Soap and Water 1 x Per Day/30 Days Discharge Instructions: May shower and wash wound with dial antibacterial soap and water prior to dressing change. Cleanser: Wound Cleanser 1 x Per Day/30 Days Discharge Instructions: Cleanse the wound with wound cleanser prior to applying a clean dressing using gauze sponges, not tissue or cotton balls. Peri-Wound Care: Sween Lotion (Moisturizing lotion) 1 x Per Day/30 Days Discharge Instructions: Apply moisturizing lotion to both feet with dressing changes Prim Dressing: Iodosorb Gel 10 (gm) Tube 1 x Per Day/30 Days ary Discharge Instructions: Apply to bottom of wound bed, silver alginate to fill dead space Secondary Dressing: Woven Gauze Sponge, Non-Sterile 4x4 in 1 x Per Day/30 Days Discharge Instructions: Moisten with saline and apply over primary dressing as directed. Secured With: [employer]  Sterile, 4.5x3.1 (in/yd) 1 x Per Day/30 Days Discharge Instructions: Secure with Kerlix as directed. Secured With: 96M Medipore H Soft Cloth Surgical T ape, 4 x 10 (in/yd) 1 x Per Day/30 Days Discharge Instructions: Secure with tape as directed. WOUND #2: - T Third Wound Laterality: Right oe Cleanser: Soap  and Water 1 x Per Day/30 Days Discharge Instructions: May shower and wash wound with dial antibacterial soap and water prior to dressing change. Cleanser: Wound Cleanser 1 x Per Day/30 Days Discharge Instructions: Cleanse the wound with wound cleanser prior to applying a clean dressing using gauze sponges, not tissue or cotton balls. Peri-Wound Care: Sween Lotion (Moisturizing lotion) 1 x Per Day/30 Days Discharge Instructions: Apply moisturizing lotion to both feet with dressing changes Prim Dressing: Iodosorb Gel 10 (gm) Tube 1 x Per Day/30 Days ary Discharge Instructions: Apply to wound bed as instructed Secondary Dressing: Woven Gauze Sponge, Non-Sterile 4x4 in 1 x Per Day/30 Days Discharge Instructions: Moisten with saline and apply over primary dressing as directed. Secured With: [employer], 4.5x3.1 (in/yd) 1 x Per Day/30 Days Discharge Instructions: Secure with Kerlix as directed. Secured With: 96M Medipore H Soft Cloth Surgical T ape, 4 x 10 (in/yd) 1 x Per Day/30 Days Discharge Instructions: Secure with tape as directed. 01/01/2022: There is still substantial slough and nonviable subcutaneous tissue present in both of the wounds. There is bone exposed in both sites. I used a curette to debride slough and nonviable subcutaneous tissue to the wounds. We will continue using Iodosorb to both sites. Continue oral antibiotics as per infectious disease recommendations. We will initiate the process of hyperbaric oxygen therapy evaluation and possible approval. He does reside in a facility so I am not certain if he would be able to meet the attendance requirements. Follow-up in 1 week. Electronic Signature(s) Signed: 01/01/2022 9:27:31 AM By: Fredirick Maudlin MD FACS Entered By: Fredirick Maudlin on 01/01/2022 09:27:30 -------------------------------------------------------------------------------- HxROS Details Patient Name: Date of Service: Justin Staggers T. 01/01/2022 8:30 A  M Medical Record Number: [id number] Patient Account Number: [id number] Date of Birth/Sex: Treating RN: 05/30/1934 (87 y.o. Janyth Contes Primary Care Provider: Krupp Other Clinician: Referring Provider: Treating Provider/Extender: Fredirick Maudlin [facility], Jairo Ben in Treatment: 1 Information Obtained From Patient Chart Eyes Medical History: Negative for: Cataracts; Glaucoma; Optic Neuritis Hematologic/Lymphatic Medical History: Positive for: Anemia Cardiovascular Medical History: Positive for: Coronary Artery Disease; Hypertension; Peripheral Arterial Disease Gastrointestinal Medical History: Past Medical History Notes: chronic pancreatitis Endocrine Medical History: Positive for: Type II Diabetes Negative for: Type I Diabetes Treated with: Insulin Blood sugar tested every day: Yes Tested : 2 times per day Genitourinary Medical History: Negative for: End Stage Renal Disease Past Medical History Notes: CKD st 3, testosterone deficiency Integumentary (Skin) Medical History: Negative for: History of Burn Musculoskeletal Medical History: Positive for: Osteoarthritis Neurologic Medical History: Negative for: Neuropathy Past Medical History Notes: CVA Oncologic Medical History: Negative for: Received Chemotherapy; Received Radiation Psychiatric Medical History: Positive for: Anorexia/bulimia - poor apetite Negative for: Confinement Anxiety Immunizations Pneumococcal Vaccine: Received Pneumococcal Vaccination: Yes Received Pneumococcal Vaccination On or After 60th Birthday: Yes Implantable Devices No devices added Hospitalization / Surgery History Type of Hospitalization/Surgery right 2nd toe amputation 11/26/21 [place] peripheral balloon angioplasty 11/24/21 temporal artery biopsy back surgery neck surgery cholecystectomy Family and Social History Cancer: No; Diabetes: Yes; Heart Disease: Yes - Mother; Hereditary Spherocytosis: No;  Hypertension: Yes - Mother,Siblings; Kidney Disease: No; Lung Disease: No; Seizures: No; Stroke: No; Thyroid Problems: No;  Tuberculosis: No; Former smoker; Marital Status - Married; Alcohol Use: Never; Drug Use: No History; Caffeine Use: Daily - coffee; Financial Concerns: No; Food, Clothing or Shelter Needs: No; Support System Lacking: No; Transportation Concerns: No Electronic Signature(s) Signed: 01/01/2022 9:47:33 AM By: Fredirick Maudlin MD FACS Signed: 01/01/2022 4:46:26 PM By: Adline Peals Entered By: Fredirick Maudlin on 01/01/2022 09:25:15 -------------------------------------------------------------------------------- SuperBill Details Patient Name: Date of Service: Justin [name] 01/01/2022 Medical Record Number: [id number] Patient Account Number: [id number] Date of Birth/Sex: Treating RN: November 16, 1934 (87 y.o. Janyth Contes Primary Care Provider: Center, Romelle Starcher Other Clinician: Referring Provider: Treating Provider/Extender: Fredirick Maudlin [facility], Bethany Weeks in Treatment: 1 Diagnosis Coding ICD-10 Codes Code Description E11.42 Type 2 diabetes mellitus with diabetic polyneuropathy N18.32 Chronic kidney disease, stage 3b I73.9 Peripheral vascular disease, unspecified I25.10 Atherosclerotic heart disease of native coronary artery without angina pectoris I10 Essential (primary) hypertension K86.1 Other chronic pancreatitis E11.621 Type 2 diabetes mellitus with foot ulcer T81.31XS Disruption of external operation (surgical) wound, not elsewhere classified, sequela 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 of necrosis M86.171 Other acute osteomyelitis, right ankle and foot Facility Procedures CPT4 Code: [id number] Description: 14431 - DEB SUBQ TISSUE 20 SQ CM/< ICD-10 Diagnosis Description L97.514 Non-pressure  chronic ulcer of other part of right foot with necrosis of bone L97.516 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 wit Modifier: hout evidence of nec Quantity: 1 rosis Physician Procedures : CPT4 Code Description Modifier [id number] - WC PHYS LEVEL 4 - EST PT 25 ICD-10 Diagnosis Description L97.514 Non-pressure chronic ulcer of other part of right foot with necrosis of bone L97.516 Non-pressure chronic ulcer of other part of right foot  with bone involvement without evidence of nec M86.171 Other acute osteomyelitis, right ankle and foot E11.621 Type 2 diabetes mellitus with foot ulcer Quantity: 1 rosis : [id number] - WC PHYS SUBQ TISS 20 SQ CM ICD-10 Diagnosis Description L97.514 Non-pressure chronic ulcer of other part of right foot with necrosis of bone L97.516 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 without  evidence of nec Quantity: 1 rosis Electronic Signature(s) Signed: 01/01/2022 9:28:00 AM By: Fredirick Maudlin MD FACS Entered By: Fredirick Maudlin on 01/01/2022 09:28:00

## 2022-01-01 NOTE — Progress Notes (Signed)
NICASIO, BARLOWE T ([id number]) . Visit Report for 01/01/2022 Arrival Information Details Patient Name: Date of Service: RO KALLEN, MCCRYSTAL 01/01/2022 8:30 A M Medical Record Number: [id number] Patient Account Number: [id number] Date of Birth/Sex: Treating RN: 1935/01/28 (87 y.o. Janyth Contes Primary Care Yosselyn Tax: Center, Romelle Starcher Other Clinician: Referring Clarke Peretz: Treating Yosef Krogh/Extender: Fredirick Maudlin Center, Bethany Weeks in Treatment: 1 Visit Information History Since Last Visit Added or deleted any medications: No Patient Arrived: Wheel Chair Any new allergies or adverse reactions: No Arrival Time: 08:49 Had a fall or experienced change in No Accompanied By: granddaughter activities of daily living that may affect Transfer Assistance: EasyPivot Patient Lift risk of falls: Patient Identification Verified: Yes Signs or symptoms of abuse/neglect since last visito No Secondary Verification Process Completed: Yes Hospitalized since last visit: No Patient Requires Transmission-Based Precautions: No Implantable device outside of the clinic excluding No Patient Has Alerts: Yes cellular tissue based products placed in the center Patient Alerts: R ABI =.75, TBI= .34 since last visit: L ABI= .86, TBI= 1.02 Has Dressing in Place as Prescribed: Yes Pain Present Now: No Electronic Signature(s) Signed: 01/01/2022 4:46:26 PM By: Adline Peals Entered By: Adline Peals on 01/01/2022 08:49:56 -------------------------------------------------------------------------------- Encounter Discharge Information Details Patient Name: Date of Service: Meredith Staggers T. 01/01/2022 8:30 A M Medical Record Number: [id number] Patient Account Number: [id number] Date of Birth/Sex: Treating RN: [birth year] (87 y.o. Janyth Contes Primary Care Dameir Gentzler: Center, Romelle Starcher Other Clinician: Referring Makye Radle: Treating Rejoice Heatwole/Extender: Fredirick Maudlin Center, Bethany Weeks  in Treatment: 1 Encounter Discharge Information Items Post Procedure Vitals Discharge Condition: Stable Temperature (F): 98.3 Ambulatory Status: Wheelchair Pulse (bpm): 89 Discharge Destination: Home Respiratory Rate (breaths/min): 18 Transportation: Private Auto Blood Pressure (mmHg): 132/71 Accompanied By: granddaughter Schedule Follow-up Appointment: Yes Clinical Summary of Care: Patient Declined Electronic Signature(s) Signed: 01/01/2022 4:46:26 PM By: Adline Peals Entered By: Adline Peals on 01/01/2022 10:12:18 -------------------------------------------------------------------------------- Lower Extremity Assessment Details Patient Name: Date of Service: ABDIFATAH, COLQUHOUN 01/01/2022 8:30 A M Medical Record Number: [id number] Patient Account Number: [id number] Date of Birth/Sex: Treating RN: [birth year] (87 y.o. Janyth Contes Primary Care Johnye Kist: Center, [place] Other Clinician: Referring Karlen Barbar: Treating Layani Foronda/Extender: Fredirick Maudlin Center, Bethany Weeks in Treatment: 1 Edema Assessment Assessed: [Left: No] [Right: No] Edema: [Left: N] [Right: o] Calf Left: Right: Point of Measurement: From Medial Instep 31 cm Ankle Left: Right: Point of Measurement: From Medial Instep 20.5 cm Electronic Signature(s) Signed: 01/01/2022 4:46:26 PM By: Adline Peals Entered By: Adline Peals on 01/01/2022 08:56:00 -------------------------------------------------------------------------------- Multi Wound Chart Details Patient Name: Date of Service: Meredith Staggers T. 01/01/2022 8:30 A M Medical Record Number: [id number] Patient Account Number: [id number] Date of Birth/Sex: Treating RN: [birth year] (87 y.o. Janyth Contes Primary Care Vonzella Althaus: Center, Romelle Starcher Other Clinician: Referring Teven Mittman: Treating Charon Akamine/Extender: Fredirick Maudlin Center, Bethany Weeks in Treatment: 1 Vital Signs Height(in): 72 Pulse(bpm):  89 Weight(lbs): 146 Blood Pressure(mmHg): 132/71 Body Mass Index(BMI): 19.8 Temperature(F): 98.3 Respiratory Rate(breaths/min): 18 Photos: [N/A:N/A] Right Amputation Site - Toe Right T Third oe N/A Wound Location: Surgical Injury Gradually Appeared N/A Wounding Event: Diabetic Wound/Ulcer of the Lower Diabetic Wound/Ulcer of the Lower N/A Primary Etiology: Extremity Extremity Open Surgical Wound Arterial Insufficiency Ulcer N/A Secondary Etiology: Anemia, Coronary Artery Disease, Anemia, Coronary Artery Disease, N/A Comorbid History: Hypertension, Peripheral Arterial Hypertension, Peripheral Arterial Disease, Type II Diabetes, Disease, Type II Diabetes, Osteoarthritis, Anorexia/bulimia Osteoarthritis, Anorexia/bulimia 11/26/2021 11/26/2021 N/A Date Acquired: 1 1 N/A Weeks of Treatment: Open Open N/A Wound  Status: No No N/A Wound Recurrence: 2.3x1.3x1.2 0.7x0.7x0.1 N/A Measurements L x W x D (cm) 2.348 0.385 N/A A (cm) : rea 2.818 0.038 N/A Volume (cm) : 28.80% 45.50% N/A % Reduction in A rea: 59.30% 46.50% N/A % Reduction in Volume: Grade 3 Grade 3 N/A Classification: Medium Medium N/A Exudate A mount: Serosanguineous Serosanguineous N/A Exudate Type: red, brown red, brown N/A Exudate Color: Distinct, outline attached Flat and Intact N/A Wound Margin: Small (1-33%) Small (1-33%) N/A Granulation A mount: Red Red N/A Granulation Quality: Large (67-100%) Large (67-100%) N/A Necrotic A mount: Fat Layer (Subcutaneous Tissue): Yes Fat Layer (Subcutaneous Tissue): Yes N/A Exposed Structures: Bone: Yes Bone: Yes Fascia: No Fascia: No Tendon: No Tendon: No Muscle: No Muscle: No Joint: No Joint: No Small (1-33%) Small (1-33%) N/A Epithelialization: Debridement - Excisional Debridement - Excisional N/A Debridement: Pre-procedure Verification/Time Out 09:04 09:04 N/A Taken: Lidocaine 4% Topical Solution Lidocaine 4% Topical Solution N/A Pain  Control: Subcutaneous, Slough Subcutaneous, Slough N/A Tissue Debrided: Skin/Subcutaneous Tissue Skin/Subcutaneous Tissue N/A Level: 2.99 0.49 N/A Debridement A (sq cm): rea Curette Curette N/A Instrument: Minimum Minimum N/A Bleeding: Pressure Pressure N/A Hemostasis A chieved: 4 4 N/A Procedural Pain: 0 0 N/A Post Procedural Pain: Procedure was tolerated well Procedure was tolerated well N/A Debridement Treatment Response: 2.3x1.3x1.2 0.7x0.7x0.1 N/A Post Debridement Measurements L x W x D (cm) 2.818 0.038 N/A Post Debridement Volume: (cm) Debridement Debridement N/A Procedures Performed: Treatment Notes Electronic Signature(s) Signed: 01/01/2022 9:22:46 AM By: Fredirick Maudlin MD FACS Signed: 01/01/2022 4:46:26 PM By: Adline Peals Entered By: Fredirick Maudlin on 01/01/2022 09:22:46 -------------------------------------------------------------------------------- Multi-Disciplinary Care Plan Details Patient Name: Date of Service: Meredith Staggers T. 01/01/2022 8:30 A M Medical Record Number: [id number] Patient Account Number: [id number] Date of Birth/Sex: Treating RN: 1934-06-11 (87 y.o. Janyth Contes Primary Care Emeterio Balke: Center, Romelle Starcher Other Clinician: Referring Shakiyah Cirilo: Treating Dayra Rapley/Extender: Fredirick Maudlin Center, Jairo Ben in Treatment: 1 [place] reviewed with physician Active Inactive Nutrition Nursing Diagnoses: Impaired glucose control: actual or potential Potential for alteratiion in Nutrition/Potential for imbalanced nutrition Goals: Patient/caregiver will maintain therapeutic glucose control Date Initiated: 12/19/2021 Target Resolution Date: 01/16/2022 Goal Status: Active Interventions: Assess HgA1c results as ordered upon admission and as needed Assess patient nutrition upon admission and as needed per policy Provide education on elevated blood sugars and impact on wound healing Treatment  Activities: Patient referred to Primary Care Physician for further nutritional evaluation : 12/19/2021 Notes: Osteomyelitis Nursing Diagnoses: Infection: osteomyelitis Knowledge deficit related to disease process and management Goals: Patient/caregiver will verbalize understanding of disease process and disease management Date Initiated: 12/19/2021 Target Resolution Date: 01/16/2022 Goal Status: Active Patient's osteomyelitis will resolve Date Initiated: 12/19/2021 Target Resolution Date: 01/16/2022 Goal Status: Active Interventions: Assess for signs and symptoms of osteomyelitis resolution every visit Provide education on osteomyelitis Treatment Activities: Consult for HBO : 12/19/2021 Surgical debridement : 12/19/2021 Systemic antibiotics : 12/19/2021 T ordered outside of clinic : 12/19/2021 est Notes: Wound/Skin Impairment Nursing Diagnoses: Impaired tissue integrity Knowledge deficit related to ulceration/compromised skin integrity Goals: Patient/caregiver will verbalize understanding of skin care regimen Date Initiated: 12/19/2021 Target Resolution Date: 01/16/2022 Goal Status: Active Ulcer/skin breakdown will have a volume reduction of 30% by week 4 Date Initiated: 12/19/2021 Target Resolution Date: 01/16/2022 Goal Status: Active Interventions: Assess patient/caregiver ability to obtain necessary supplies Assess patient/caregiver ability to perform ulcer/skin care regimen upon admission and as needed Assess ulceration(s) every visit Provide education on ulcer and skin care Treatment Activities: Skin care regimen initiated :  12/19/2021 Topical wound management initiated : 12/19/2021 Notes: Electronic Signature(s) Signed: 01/01/2022 4:46:26 PM By: Adline Peals Entered By: Adline Peals on 01/01/2022 08:58:06 -------------------------------------------------------------------------------- Pain Assessment Details Patient Name: Date of Service: Meredith Staggers  T. 01/01/2022 8:30 A M Medical Record Number: [id number] Patient Account Number: [id number] Date of Birth/Sex: Treating RN: 06/27/1934 (87 y.o. Janyth Contes Primary Care Seibert Keeter: [place] Other Clinician: Referring Dayvian Blixt: Treating Kennet Mccort/Extender: Bernell List, Bethany Weeks in Treatment: 1 Active Problems Location of Pain Severity and Description of Pain Patient Has Paino No Site Locations Rate the pain. Current Pain Level: 0 Pain Management and Medication Current Pain Management: Electronic Signature(s) Signed: 01/01/2022 4:46:26 PM By: Adline Peals Entered By: Adline Peals on 01/01/2022 08:55:57 -------------------------------------------------------------------------------- Patient/Caregiver Education Details Patient Name: Date of Service: RO Frederich Balding 9/25/2023andnbsp8:30 Pierre Record Number: [id number] Patient Account Number: [id number] Date of Birth/Gender: Treating RN: 06/24/1934 (87 y.o. Janyth Contes Primary Care Physician: [place], [place] Other Clinician: Referring Physician: Treating Physician/Extender: Fredirick Maudlin Center, Jairo Ben in Treatment: 1 Education Assessment Education Provided To: Patient Education Topics Provided Wound/Skin Impairment: Methods: Explain/Verbal Responses: Reinforcements needed, State content correctly Electronic Signature(s) Signed: 01/01/2022 4:46:26 PM By: Adline Peals Entered By: Adline Peals on 01/01/2022 08:58:19 -------------------------------------------------------------------------------- Wound Assessment Details Patient Name: Date of Service: Meredith Staggers T. 01/01/2022 8:30 A M Medical Record Number: [id number] Patient Account Number: [id number] Date of Birth/Sex: Treating RN: 1934-10-30 (87 y.o. Janyth Contes Primary Care Brigett Estell: Center, Romelle Starcher Other Clinician: Referring Namish Krise: Treating Louisa Favaro/Extender: Fredirick Maudlin Center, Bethany Weeks in Treatment: 1 Wound Status Wound Number: 1 Primary Diabetic Wound/Ulcer of the Lower Extremity Etiology: Wound Location: Right Amputation Site - Toe Secondary Open Surgical Wound Wounding Event: Surgical Injury Etiology: Date Acquired: 11/26/2021 Wound Open Weeks Of Treatment: 1 Status: Clustered Wound: No Comorbid Anemia, Coronary Artery Disease, Hypertension, Peripheral History: Arterial Disease, Type II Diabetes, Osteoarthritis, Anorexia/bulimia Photos Wound Measurements Length: (cm) 2.3 Width: (cm) 1.3 Depth: (cm) 1.2 Area: (cm) 2.348 Volume: (cm) 2.818 % Reduction in Area: 28.8% % Reduction in Volume: 59.3% Epithelialization: Small (1-33%) Tunneling: No Undermining: No Wound Description Classification: Grade 3 Wound Margin: Distinct, outline attached Exudate Amount: Medium Exudate Type: Serosanguineous Exudate Color: red, brown Foul Odor After Cleansing: No Slough/Fibrino Yes Wound Bed Granulation Amount: Small (1-33%) Exposed Structure Granulation Quality: Red Fascia Exposed: No Necrotic Amount: Large (67-100%) Fat Layer (Subcutaneous Tissue) Exposed: Yes Necrotic Quality: Adherent Slough Tendon Exposed: No Muscle Exposed: No Joint Exposed: No Bone Exposed: Yes Treatment Notes Wound #1 (Amputation Site - Toe) Wound Laterality: Right Cleanser Soap and Water Discharge Instruction: May shower and wash wound with dial antibacterial soap and water prior to dressing change. Wound Cleanser Discharge Instruction: Cleanse the wound with wound cleanser prior to applying a clean dressing using gauze sponges, not tissue or cotton balls. Peri-Wound Care Sween Lotion (Moisturizing lotion) Discharge Instruction: Apply moisturizing lotion to both feet with dressing changes Topical Primary Dressing Iodosorb Gel 10 (gm) Tube Discharge Instruction: Apply to bottom of wound bed, silver alginate to fill dead space Secondary  Dressing Woven Gauze Sponge, Non-Sterile 4x4 in Discharge Instruction: Moisten with saline and apply over primary dressing as directed. Secured With [employer], 4.5x3.1 (in/yd) Discharge Instruction: Secure with Kerlix as directed. 53M Medipore H Soft Cloth Surgical T ape, 4 x 10 (in/yd) Discharge Instruction: Secure with tape as directed. Compression Wrap Compression Stockings Add-Ons Electronic Signature(s) Signed: 01/01/2022 4:46:26 PM By: Adline Peals Entered By: Adline Peals  on 01/01/2022 09:00:33 -------------------------------------------------------------------------------- Wound Assessment Details Patient Name: Date of Service: RO JACHIN, COURY 01/01/2022 8:30 A M Medical Record Number: [id number] Patient Account Number: [id number] Date of Birth/Sex: Treating RN: 04/16/1934 (87 y.o. Janyth Contes Primary Care Brookelle Pellicane: Center, Romelle Starcher Other Clinician: Referring Joannie Medine: Treating Malikah Principato/Extender: Fredirick Maudlin Center, Bethany Weeks in Treatment: 1 Wound Status Wound Number: 2 Primary Diabetic Wound/Ulcer of the Lower Extremity Etiology: Wound Location: Right T Third oe Secondary Arterial Insufficiency Ulcer Wounding Event: Gradually Appeared Etiology: Date Acquired: 11/26/2021 Wound Open Weeks Of Treatment: 1 Status: Clustered Wound: No Comorbid Anemia, Coronary Artery Disease, Hypertension, Peripheral History: Arterial Disease, Type II Diabetes, Osteoarthritis, Anorexia/bulimia Photos Wound Measurements Length: (cm) 0.7 Width: (cm) 0.7 Depth: (cm) 0.1 Area: (cm) 0.385 Volume: (cm) 0.038 % Reduction in Area: 45.5% % Reduction in Volume: 46.5% Epithelialization: Small (1-33%) Tunneling: No Undermining: No Wound Description Classification: Grade 3 Wound Margin: Flat and Intact Exudate Amount: Medium Exudate Type: Serosanguineous Exudate Color: red, brown Foul Odor After Cleansing: No Slough/Fibrino Yes Wound  Bed Granulation Amount: Small (1-33%) Exposed Structure Granulation Quality: Red Fascia Exposed: No Necrotic Amount: Large (67-100%) Fat Layer (Subcutaneous Tissue) Exposed: Yes Necrotic Quality: Adherent Slough Tendon Exposed: No Muscle Exposed: No Joint Exposed: No Bone Exposed: Yes Treatment Notes Wound #2 (Toe Third) Wound Laterality: Right Cleanser Soap and Water Discharge Instruction: May shower and wash wound with dial antibacterial soap and water prior to dressing change. Wound Cleanser Discharge Instruction: Cleanse the wound with wound cleanser prior to applying a clean dressing using gauze sponges, not tissue or cotton balls. Peri-Wound Care Sween Lotion (Moisturizing lotion) Discharge Instruction: Apply moisturizing lotion to both feet with dressing changes Topical Primary Dressing Iodosorb Gel 10 (gm) Tube Discharge Instruction: Apply to wound bed as instructed Secondary Dressing Woven Gauze Sponge, Non-Sterile 4x4 in Discharge Instruction: Moisten with saline and apply over primary dressing as directed. Secured With [employer], 4.5x3.1 (in/yd) Discharge Instruction: Secure with Kerlix as directed. 12M Medipore H Soft Cloth Surgical T ape, 4 x 10 (in/yd) Discharge Instruction: Secure with tape as directed. Compression Wrap Compression Stockings Add-Ons Electronic Signature(s) Signed: 01/01/2022 4:46:26 PM By: Adline Peals Entered By: Adline Peals on 01/01/2022 09:00:51 -------------------------------------------------------------------------------- Vitals Details Patient Name: Date of Service: Meredith Staggers T. 01/01/2022 8:30 A M Medical Record Number: [id number] Patient Account Number: [id number] Date of Birth/Sex: Treating RN: 07-06-1934 (87 y.o. Janyth Contes Primary Care Lauramae Kneisley: Other Clinician: Center, Romelle Starcher Referring Lonn Im: Treating Delanie Tirrell/Extender: Fredirick Maudlin Center, Bethany Weeks in Treatment:  1 Vital Signs Time Taken: 08:49 Temperature (F): 98.3 Height (in): 72 Pulse (bpm): 89 Weight (lbs): 146 Respiratory Rate (breaths/min): 18 Body Mass Index (BMI): 19.8 Blood Pressure (mmHg): 132/71 Reference Range: 80 - 120 mg / dl Electronic Signature(s) Signed: 01/01/2022 4:46:26 PM By: Adline Peals Entered By: Adline Peals on 01/01/2022 08:50:11

## 2022-01-03 NOTE — Telephone Encounter (Signed)
Received referral and patient has been scheduled based on referral. Closing encounter.

## 2022-01-08 ENCOUNTER — Encounter (HOSPITAL_BASED_OUTPATIENT_CLINIC_OR_DEPARTMENT_OTHER): Payer: Medicare Other | Attending: General Surgery | Admitting: General Surgery

## 2022-01-08 DIAGNOSIS — M009 Pyogenic arthritis, unspecified: Secondary | ICD-10-CM | POA: Insufficient documentation

## 2022-01-08 DIAGNOSIS — E11621 Type 2 diabetes mellitus with foot ulcer: Secondary | ICD-10-CM | POA: Diagnosis present

## 2022-01-08 DIAGNOSIS — I129 Hypertensive chronic kidney disease with stage 1 through stage 4 chronic kidney disease, or unspecified chronic kidney disease: Secondary | ICD-10-CM | POA: Diagnosis not present

## 2022-01-08 DIAGNOSIS — L97514 Non-pressure chronic ulcer of other part of right foot with necrosis of bone: Secondary | ICD-10-CM | POA: Insufficient documentation

## 2022-01-08 DIAGNOSIS — N1832 Chronic kidney disease, stage 3b: Secondary | ICD-10-CM | POA: Insufficient documentation

## 2022-01-08 DIAGNOSIS — M1990 Unspecified osteoarthritis, unspecified site: Secondary | ICD-10-CM | POA: Insufficient documentation

## 2022-01-08 DIAGNOSIS — K861 Other chronic pancreatitis: Secondary | ICD-10-CM | POA: Diagnosis not present

## 2022-01-08 DIAGNOSIS — M86171 Other acute osteomyelitis, right ankle and foot: Secondary | ICD-10-CM | POA: Insufficient documentation

## 2022-01-08 NOTE — Progress Notes (Signed)
[facility] [place]  ASSESSMENT / PLAN: Justin [name] is a 87 y.o. male with atherosclerosis of native arteries of right lower extremity.  He has deep ulceration of the right forefoot about the surgically absent second toe.  There is concern for osteomyelitis in the forefoot.  Recommend the following which can slow the progression of atherosclerosis and reduce the risk of major adverse cardiac / limb events:  Complete cessation from all tobacco products. Blood glucose control with goal A1c < 7%. Blood pressure control with goal blood pressure < 140/90 mmHg. Lipid reduction therapy with goal LDL-C <100 mg/dL (<70 if symptomatic from PAD).  Aspirin 81mg  PO QD.  Plavix 75mg  PO QD. Atorvastatin 40-80mg  PO QD (or other "high intensity" statin therapy).  Recent angiogram reviewed in detail.  The patient is optimized from a vascular standpoint.  I can do nothing to further improve the chances of healing at the foot.  A transmetatarsal amputation is a reasonable next step, but this has a moderate risk of failure given the disadvantaged pedal circulation.  See him again in several months with repeat noninvasive testing.  CHIEF COMPLAINT: Ulceration of right foot  HISTORY OF PRESENT ILLNESS: Justin [name] is a 87 y.o. male who returns to clinic for follow-up after angiogram 11/24/2021 done for atherosclerosis of the right lower extremity with gangrene.  He ultimately underwent a successful stenting of the right popliteal artery, angioplasty of the tibioperoneal trunk and peroneal artery.  Fortunately has peroneal only runoff and severely disadvantaged pedal circulation.  His right foot has continued to deteriorate.  He is in a wheelchair today.  He is getting meticulous wound care by the wound care center and Dr. Loel Lofty.  VASCULAR SURGICAL HISTORY:  11/24/21 - R popliteal drug coated angioplasty, right tibioperoneal trunk angioplasty, right peroneal artery  angioplasty  Past Medical History:  Diagnosis Date   Acute metabolic encephalopathy 06/20/9700   Arthritis    Diabetes mellitus    HTN (hypertension)    Hypercholesteremia    Iron deficiency    Pancreatitis, acute 06/02/2012   Diagnosed 05/2012    Personal history of colonic adenomas 04/15/2013   04/15/2013 2 diminutive polyps     Stroke [facility])    TIA (transient ischemic attack)     Past Surgical History:  Procedure Laterality Date   ABDOMINAL AORTOGRAM W/LOWER EXTREMITY Right 11/24/2021   Procedure: ABDOMINAL AORTOGRAM W/LOWER EXTREMITY;  Surgeon: Cherre Robins, MD;  Location: [facility];  Service: Cardiovascular;  Laterality: Right;   AMPUTATION TOE Right 11/26/2021   Procedure: AMPUTATION TOE;  Surgeon: [name], DPM;  Location: [facility];  Service: Podiatry;  Laterality: Right;   ARTERY BIOPSY  02/26/2011   Procedure: BIOPSY TEMPORAL ARTERY;  Surgeon: Willey Blade, MD;  Location: [facility];  Service: General;  Laterality: Left;  Left temporal artery biospy   [place]   PERIPHERAL VASCULAR BALLOON ANGIOPLASTY Right 11/24/2021   Procedure: PERIPHERAL VASCULAR BALLOON ANGIOPLASTY;  Surgeon: Cherre Robins, MD;  Location: [facility];  Service: Cardiovascular;  Laterality: Right;  POPLITEAL AND PERONEAL   TEMPORAL ARTERY BIOPSY / LIGATION  02/26/2011   left side   UPPER GASTROINTESTINAL ENDOSCOPY      Family History  Problem Relation Age of Onset   Heart disease Mother    Amblyopia Father  Diabetes Sister        x 2   Heart attack Sister     Social History   Socioeconomic History   Marital status: Married    Spouse name: Not on file   Number of children: 6   Years of education: Not on file   Highest education level: Not on file  Occupational History   Not on file  Tobacco Use   Smoking status: Former    Years: 35.00    Types: Cigarettes     Quit date: 04/09/1990    Years since quitting: 31.7   Smokeless tobacco: Never  Substance and Sexual Activity   Alcohol use: No    Comment: "per pt stopped drinking in the 90's"   Drug use: No   Sexual activity: Not on file  Other Topics Concern   Not on file  Social History Narrative   Not on file   Social Determinants of Health   Financial Resource Strain: Not on file  Food Insecurity: Not on file  Transportation Needs: Not on file  Physical Activity: Not on file  Stress: Not on file  Social Connections: Not on file  Intimate Partner Violence: Not on file    No Known Allergies  Current Outpatient Medications  Medication Sig Dispense Refill   acetaminophen (TYLENOL) 325 MG tablet Take 650 mg by mouth daily as needed (pain).     Alcohol Swabs PADS Use to check blood sugar 3 times daily. Dx: E10.9 300 each 3   aspirin 81 MG chewable tablet Chew 81 mg by mouth daily.     atorvastatin (LIPITOR) 40 MG tablet Take 40 mg by mouth at bedtime.     Blood Glucose Calibration (ACCU-CHEK AVIVA) SOLN Use to check blood sugar 3 times daily. Dx: E10.9 1 each 3   Blood Glucose Monitoring Suppl (ACCU-CHEK AVIVA PLUS) w/Device KIT Use to check blood sugar 3 times daily. Dx: E10.9 1 kit 0   cefadroxil (DURICEF) 500 MG capsule Take 2 capsules (1,000 mg total) by mouth 2 (two) times daily. 120 capsule 0   clopidogrel (PLAVIX) 75 MG tablet Take 1 tablet (75 mg total) by mouth daily with breakfast.     doxycycline (VIBRA-TABS) 100 MG tablet Take 1 tablet (100 mg total) by mouth 2 (two) times daily. 60 tablet 0   Emollient (AQUAPHOR ADV PROTECT HEALING) 41 % OINT Apply 1 application  topically daily. Mix with Lidocaine cream . Apply to feet/toes     ferrous gluconate (FERGON) 324 MG tablet Take 324 mg by mouth See admin instructions. Take 324 mg by mouth on Monday, Tuesday, Wednesday, Thursday, Friday     fluticasone furoate-vilanterol (BREO ELLIPTA) 100-25 MCG/ACT AEPB Inhale 1 puff into the lungs  daily.     glucagon (GLUCAGON EMERGENCY) 1 MG injection Inject 1 mg into the muscle once as needed. (Patient taking differently: Inject 1 mg into the muscle once as needed (low blood sugar).) 1 each 12   glucose blood (ACCU-CHEK AVIVA PLUS) test strip Use to check blood sugar 3 times daily. Dx: E10.9 300 each 3   insulin glargine-yfgn (SEMGLEE) 100 UNIT/ML injection Inject 0.1 mLs (10 Units total) into the skin daily. (Patient taking differently: Inject 14 Units into the skin daily.) 10 mL 11   Insulin Pen Needle 32G X 4 MM MISC Use as directed to inject insulin tidwc 100 each 11   Ipratropium-Albuterol (COMBIVENT) 20-100 MCG/ACT AERS respimat Inhale 1 puff into the lungs every 6 (six) hours as needed  for wheezing. (Patient taking differently: Inhale 1 puff into the lungs every 6 (six) hours as needed (COPD).)     isosorbide mononitrate (ISMO) 10 MG tablet Take 10 mg by mouth at bedtime.     Lancets (ACCU-CHEK SOFT TOUCH) lancets Use to check blood sugar 3 times daily. Dx: E10.9 300 each 3   lidocaine (LMX) 4 % cream Apply 1 application  topically daily. Apply generously to right lower leg and heels daily-pain.     loratadine (CLARITIN) 10 MG tablet Take 10 mg by mouth daily.     NOVOLOG FLEXPEN 100 UNIT/ML FlexPen Inject 0-8 mLs into the skin in the morning and at bedtime. Per sliding scale: if is BS 70-200 give 0 units, if BS is 201-250 give 2 units, if BS is 251-300 give 4 units, if BS is 301-350 give 6 units, if BS is 351-400 give 8 units. If blood sugar is greater than 400 call MD.     omeprazole (PRILOSEC) 20 MG capsule Take 20 mg by mouth every other day.     oxyCODONE (OXY IR/ROXICODONE) 5 MG immediate release tablet Take 1 tablet (5 mg total) by mouth every 6 (six) hours as needed for severe pain or breakthrough pain. 15 tablet 0   Pancrelipase, Lip-Prot-Amyl, (CREON) 24000-76000 units CPEP Take 1 capsule by mouth in the morning, at noon, and at bedtime.     senna-docusate (SENOKOT-S) 8.6-50  MG tablet Take 1 tablet by mouth at bedtime as needed for moderate constipation.     Skin Protectants, Misc. (MINERIN CREME EX) Apply 1 application  topically daily. Apply minerin cream to bilateral feet daily. R/t dry/cracked skin     Spacer/Aero-Holding Dorise Bullion Use as directed with combivent. Ok to dispense any spacer that works with the inhaler being rxed to pt. 1 each 1   TRULICITY 1.5 DQ/4.4PO SOPN Inject 1.5 mg into the skin once a week. Fridays     No current facility-administered medications for this visit.    PHYSICAL EXAM There were no vitals filed for this visit.  Chronically ill gentleman in no acute distress Regular rate and rhythm Unlabored breathing Right foot with brisk peroneal artery signal at the ankle. Deep ulceration about the right second toe which is surgically absent.  PERTINENT LABORATORY AND RADIOLOGIC DATA  Most recent CBC    Latest Ref Rng & Units 12/20/2021    2:34 PM 12/12/2021    5:58 AM 12/11/2021    3:12 AM  CBC  WBC 3.8 - 10.8 Thousand/uL 9.7  11.7  8.9   Hemoglobin 13.2 - 17.1 g/dL 10.6  10.4  11.1   Hematocrit 38.5 - 50.0 % 32.6  31.3  33.3   Platelets 140 - 400 Thousand/uL 360  308  321      Most recent CMP    Latest Ref Rng & Units 12/20/2021    2:34 PM 12/12/2021    2:53 AM 12/11/2021    3:12 AM  CMP  Glucose 65 - 99 mg/dL 188  74  116   BUN 7 - 25 mg/dL 24  <5  19   Creatinine 0.70 - 1.22 mg/dL 2.01  0.71  1.61   Sodium 135 - 146 mmol/L 132  134  135   Potassium 3.5 - 5.3 mmol/L 4.2  4.1  4.5   Chloride 98 - 110 mmol/L 100  105  100   CO2 20 - 32 mmol/L _0 Calcium 8.6 - 10.3 mg/dL 9.3  7.5  9.2   Total Protein 6.1 - 8.1 g/dL 9.5     Total Bilirubin 0.2 - 1.2 mg/dL 0.8     AST 10 - 35 U/L 21     ALT 9 - 46 U/L 10       Renal function CrCl cannot be calculated (Unknown ideal weight.).  Hgb A1c MFr Bld (%)  Date Value  12/10/2021 8.9 (H)    LDL Cholesterol  Date Value Ref Range Status  11/25/2021 46 0 - 99 mg/dL  Final    Comment:           Total Cholesterol/HDL:CHD Risk Coronary Heart Disease Risk Table                     Men   Women  1/2 Average Risk   3.4   3.3  Average Risk       5.0   4.4  2 X Average Risk   9.6   7.1  3 X Average Risk  23.4   11.0        Use the calculated Patient Ratio above and the CHD Risk Table to determine the patient's CHD Risk.        ATP III CLASSIFICATION (LDL):  <100     mg/dL   Optimal  100-129  mg/dL   Near or Above                    Optimal  130-159  mg/dL   Borderline  160-189  mg/dL   High  >190     mg/dL   Very High Performed at [facility] [address] [place], [place] [address]     [name], MD [facility] Phone Number: [phone] 01/08/2022 3:46 PM  Total time spent on preparing this encounter including chart review, data review, collecting history, examining the patient, coordinating care for this established patient, 40 minutes.  Portions of this report may have been transcribed using voice recognition software.  Every effort has been made to ensure accuracy; however, inadvertent computerized transcription errors may still be present.

## 2022-01-08 NOTE — Progress Notes (Signed)
Justin [name] ([id number]) . Visit Report for 01/08/2022 Chief Complaint Document Details Patient Name: Date of Service: Justin [name], Justin [name] 01/08/2022 12:30 PM Medical Record Number: [id number] Patient Account Number: [id number] Date of Birth/Sex: Treating RN: 1934/08/11 (87 y.o. M) Primary Care Provider: [facility] [place] Other Clinician: Referring Provider: Treating Provider/Extender: Fredirick Maudlin [facility], [facility] Weeks in Treatment: 2 Information Obtained from: Patient Chief Complaint Patients presents for treatment of open diabetic ulcers and dehiscence of surgical site Electronic Signature(s) Signed: 01/08/2022 1:38:05 PM By: Fredirick Maudlin MD FACS Entered By: Fredirick Maudlin on 01/08/2022 13:38:04 -------------------------------------------------------------------------------- Debridement Details Patient Name: Date of Service: Justin Staggers [name]. 01/08/2022 12:30 PM Medical Record Number: [id number] Patient Account Number: [id number] Date of Birth/Sex: Treating RN: 1934/12/08 (87 y.o. [name] Primary Care Provider: [facility] [place] Other Clinician: Referring Provider: Treating Provider/Extender: Fredirick Maudlin [facility], [place] Weeks in Treatment: 2 Debridement Performed for Assessment: Wound #1 Right Amputation Site - Toe Performed By: Physician Fredirick Maudlin, MD Debridement Type: Debridement Severity of Tissue Pre Debridement: Necrosis of bone Level of Consciousness (Pre-procedure): Awake and Alert Pre-procedure Verification/Time Out Yes - 13:00 Taken: Start Time: 13:00 Pain Control: Lidocaine 4% [name] opical Solution [name] Area Debrided (L x W): otal 2.1 (cm) x 2.5 (cm) = 5.25 (cm) Tissue and other material debrided: Viable, Non-Viable, Slough, Subcutaneous, Slough Level: Skin/Subcutaneous Tissue Debridement Description: Excisional Instrument: Curette Bleeding: Minimum Hemostasis Achieved: Pressure Procedural Pain: 0 Post Procedural Pain: 0 Response  to Treatment: Procedure was tolerated well Level of Consciousness (Post- Awake and Alert procedure): Post Debridement Measurements of Total Wound Length: (cm) 2.1 Width: (cm) 2.5 Depth: (cm) 2.1 Volume: (cm) 8.659 Character of Wound/Ulcer Post Debridement: Improved Severity of Tissue Post Debridement: Necrosis of bone Post Procedure Diagnosis Same as Pre-procedure Notes scribed by Baruch Gouty, RN for Dr. Celine Ahr Electronic Signature(s) Signed: 01/08/2022 3:28:59 PM By: Fredirick Maudlin MD FACS Signed: 01/08/2022 5:15:44 PM By: Baruch Gouty RN, BSN Entered By: Baruch Gouty on 01/08/2022 13:07:43 -------------------------------------------------------------------------------- Debridement Details Patient Name: Date of Service: Justin Staggers [name]. 01/08/2022 12:30 PM Medical Record Number: [id number] Patient Account Number: [id number] Date of Birth/Sex: Treating RN: 09/02/1934 (87 y.o. [name] Primary Care Provider: [facility] [place] Other Clinician: Referring Provider: Treating Provider/Extender: Fredirick Maudlin [facility] [place] We[facility] in Treatment: 2 Debridement Performed for Assessment: Wound #2 Right [name] Performed By: Physician Fredirick Maudlin, MD Debridement Type: Debridement Severity of Tissue Pre Debridement: Necrosis of bone Level of Consciousness (Pre-procedure): Awake and Alert Pre-procedure Verification/Time Out Yes - 13:00 Taken: Start Time: 13:00 Pain Control: Lidocaine 4% [name] opical Solution [name] Area Debrided (L x W): otal 0.9 (cm) x 0.8 (cm) = 0.72 (cm) Tissue and other material debrided: Viable, Non-Viable, Slough, Subcutaneous, Slough Level: Skin/Subcutaneous Tissue Debridement Description: Excisional Instrument: Curette Bleeding: Minimum Hemostasis Achieved: Pressure Procedural Pain: 0 Post Procedural Pain: 0 Response to Treatment: Procedure was tolerated well Level of Consciousness (Post- Awake and Alert procedure): Post  Debridement Measurements of Total Wound Length: (cm) 0.9 Width: (cm) 0.8 Depth: (cm) 0.1 Volume: (cm) 0.057 Character of Wound/Ulcer Post Debridement: Improved Severity of Tissue Post Debridement: Necrosis of bone Post Procedure Diagnosis Same as Pre-procedure Notes scribed by Baruch Gouty, RN for Dr. Celine Ahr Electronic Signature(s) Signed: 01/08/2022 3:28:59 PM By: Fredirick Maudlin MD FACS Signed: 01/08/2022 5:15:44 PM By: Baruch Gouty RN, BSN Entered By: Baruch Gouty on 01/08/2022 13:07:52 -------------------------------------------------------------------------------- HPI Details Patient Name: Date of Service: Justin Staggers [name]. 01/08/2022 12:30 PM Medical Record Number: [id number]  Patient Account Number: [id number] Date of Birth/Sex: Treating RN: October 19, 1934 (87 y.o. M) Primary Care Provider: [facility] [place] Other Clinician: Referring Provider: Treating Provider/Extender: Fredirick Maudlin [facility], [facility] Weeks in Treatment: 2 History of Present Illness HPI Description: ADMISSION 12/19/2021 This is an 87 year old man with multiple medical problems including type 2 diabetes mellitus, chronic pancreatitis, coronary artery disease, peripheral vascular disease, stroke and TIA. He was admitted to the hospital on November 15, 2021 with a diabetic foot infection. An MRI was performed on admission that was questionable for early osteomyelitis. He was seen in consultation by both podiatry and vascular surgery. Due to limb threatening ischemia, he underwent a lower extremity angiogram with multiple angioplasties on August 18. He then had amputation of his right second toe on August 20. There was frank pus present which was cultured. He was placed on a course of oral antibiotics. He subsequently was discharged but returned due to altered mental status on September 2. Repeat MRI was performed that was again concerning for osteomyelitis. Infectious disease was consulted. A 4-week course of  oral doxycycline and cefadroxil was prescribed. Podiatry referred him to the wound care center for further evaluation and management. On exam, there is a recent right second toe amputation site. The Prolene sutures are gaping and the wound has a fairly deep cavity; it does probe to bone. There is no purulent drainage or odor, but there is quite a bit of fat necrosis present. On the lateral aspect of his third toe, there is an ulcer that has a leathery eschar overlying it. Underneath, bone and tendon are exposed. No purulent drainage or malodor from this wound, either. 12/25/2021: There is still extensive slough and fat necrosis in the right second toe amputation site. The lateral third toe wound has also accumulated quite a bit of slough. Today he did not have the prescribed dressing in situ, just a bit of gauze over the wound sites. 01/01/2022: Patient's facility paperwork today indicates that they are requesting a consultation for possible hyperbaric oxygen therapy. Unfortunately there are no other notes available to me from whomever made this request. There is still substantial slough and nonviable subcutaneous tissue present in both of the wounds. There is bone exposed in both sites. He continues on oral antibiotics per infectious disease. 01/08/2022: Both wounds are a bit cleaner today. It bone is still frankly exposed at both sites. The second toe amputation site is deeper, but this is likely secondary to the aggressive debridements I have been performing. We are in the process of evaluating him for hyperbaric oxygen therapy. He has been optimized from a vascular standpoint. His diabetes is suboptimally controlled, with his most recent hemoglobin A1c being 8.9% on December 10, 2021. He continues on oral antibiotics per infectious disease. Electronic Signature(s) Signed: 01/08/2022 1:40:43 PM By: Fredirick Maudlin MD FACS Entered By: Fredirick Maudlin on 01/08/2022  13:40:43 -------------------------------------------------------------------------------- Physical Exam Details Patient Name: Date of Service: Justin Staggers [name]. 01/08/2022 12:30 PM Medical Record Number: [id number] Patient Account Number: [id number] Date of Birth/Sex: Treating RN: 07/08/1934 (87 y.o. M) Primary Care Provider: [facility] [place] Other Clinician: Referring Provider: Treating Provider/Extender: Fredirick Maudlin [facility] We[facility] in Treatment: 2 Constitutional Hypertensive, asymptomatic. . . . No acute distress.. Ears, Nose, Mouth, and Throat Mild cerumen accumulation, but visualized tympanic membranes are clear. Respiratory Normal work of breathing on room air.. . Notes 01/08/2022: Both wounds are a bit cleaner today. It bone is still frankly exposed at both sites. The second toe amputation site is  deeper, but this is likely secondary to the aggressive debridements I have been performing. Electronic Signature(s) Signed: 01/08/2022 1:42:35 PM By: Fredirick Maudlin MD FACS Entered By: Fredirick Maudlin on 01/08/2022 13:42:35 -------------------------------------------------------------------------------- Physician Orders Details Patient Name: Date of Service: Justin Staggers [name]. 01/08/2022 12:30 PM Medical Record Number: [id number] Patient Account Number: [id number] Date of Birth/Sex: Treating RN: February 01, 1935 (87 y.o. [name] Primary Care Provider: [facility] Other Clinician: Referring Provider: Treating Provider/Extender: Fredirick Maudlin [facility], [facility] Weeks in Treatment: 2 Verbal / Phone Orders: No Diagnosis Coding ICD-10 Coding Code Description E11.42 Type 2 diabetes mellitus with diabetic polyneuropathy N18.32 Chronic kidney disease, stage 3b I73.9 Peripheral vascular disease, unspecified I25.10 Atherosclerotic heart disease of native coronary artery without angina pectoris I10 Essential (primary) hypertension K86.1 Other chronic  pancreatitis E11.621 Type 2 diabetes mellitus with foot ulcer T81.31XS Disruption of external operation (surgical) wound, not elsewhere classified, sequela 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 - Dr. Celine Ahr RM 1 with Vaughan Basta Return A Anesthetic Wound #1 Right Amputation Site - Toe (In clinic) Topical Lidocaine 4% applied to wound bed Wound #2 Right [name] (In clinic) Topical Lidocaine 4% applied to wound bed Bathing/ Shower/ Hygiene Other Bathing/Shower/Hygiene Orders/Instructions: - may wash feet with soap and water with dressing changes Off-Loading Heel suspension boot to: - podus boots bilateral feet Non Wound Condition Cleanse area with: - clean left ear with hydrogen peroxide and water Hyperbaric Oxygen Therapy Evaluate for HBO Therapy Indication: - osteomyelitis; Wagner 3 diabetic foot ulcer If appropriate for treatment, begin HBOT per protocol: 2.5 ATA for 90 Minutes with 2 Five (5) Minute A Breaks ir Total Number of Treatments: - 40 One treatments per day (delivered Monday through Friday unless otherwise specified in Special Instructions below): Finger stick Blood Glucose Pre- and Post- HBOT Treatment. Follow Hyperbaric Oxygen Glycemia Protocol A frin (Oxymetazoline HCL) 0.05% nasal spray - 1 spray in both nostrils daily as needed prior to HBO treatment for difficulty clearing ears Wound Treatment Wound #1 - Amputation Site - Toe Wound Laterality: Right Cleanser: Soap and Water 1 x Per Day/30 Days Discharge Instructions: May shower and wash wound with dial antibacterial soap and water prior to dressing change. Cleanser: Wound Cleanser 1 x Per Day/30 Days Discharge Instructions: Cleanse the wound with wound  cleanser prior to applying a clean dressing using gauze sponges, not tissue or cotton balls. Peri-Wound Care: Sween Lotion (Moisturizing lotion) 1 x Per Day/30 Days Discharge Instructions: Apply moisturizing lotion to both feet with dressing changes Prim Dressing: Iodosorb Gel 10 (gm) Tube 1 x Per Day/30 Days ary Discharge Instructions: Apply to bottom of wound bed, silver alginate to fill dead space Secondary Dressing: Woven Gauze Sponge, Non-Sterile 4x4 in 1 x Per Day/30 Days Discharge Instructions: Moisten with saline and apply over primary dressing as directed. Secured With: [employer], 4.5x3.1 (in/yd) 1 x Per Day/30 Days Discharge Instructions: Secure with Kerlix as directed. Secured With: 33M Medipore H Soft Cloth Surgical [name] ape, 4 x 10 (in/yd) 1 x Per Day/30 Days Discharge Instructions: Secure with tape as directed. Wound #2 - [name] Wound Laterality: Right Cleanser: Soap and Water 1 x Per Day/30 Days Discharge Instructions: May shower and wash wound with dial antibacterial soap and water prior to  dressing change. Cleanser: Wound Cleanser 1 x Per Day/30 Days Discharge Instructions: Cleanse the wound with wound cleanser prior to applying a clean dressing using gauze sponges, not tissue or cotton balls. Peri-Wound Care: Sween Lotion (Moisturizing lotion) 1 x Per Day/30 Days Discharge Instructions: Apply moisturizing lotion to both feet with dressing changes Prim Dressing: Iodosorb Gel 10 (gm) Tube 1 x Per Day/30 Days ary Discharge Instructions: Apply to wound bed as instructed Secondary Dressing: Woven Gauze Sponge, Non-Sterile 4x4 in 1 x Per Day/30 Days Discharge Instructions: Moisten with saline and apply over primary dressing as directed. Secured With: [employer], 4.5x3.1 (in/yd) 1 x Per Day/30 Days Discharge Instructions: Secure with Kerlix as directed. Secured With: 74M Medipore H Soft Cloth Surgical [name] ape, 4 x 10 (in/yd) 1 x Per Day/30 Days Discharge  Instructions: Secure with tape as directed. Radiology X-ray, Chest - pre hyperbaric oxygen therapy - (ICD10 E11.621 - Type 2 diabetes mellitus with foot ulcer) [employer] ekg - pre hyperbaric oxygen therapy GLYCEMIA INTERVENTIONS PROTOCOL PRE-HBO GLYCEMIA INTERVENTIONS ACTION INTERVENTION Obtain pre-HBO capillary blood glucose (ensure 1 physician order is in chart). A. Notify HBO physician and await physician orders. 2 If result is 70 mg/dl or below: B. If the result meets the hospital definition of a critical result, follow hospital policy. A. Give patient an 8 ounce Glucerna Shake, an 8 ounce Ensure, or 8 ounces of a Glucerna/Ensure equivalent dietary supplement*. B. Wait 30 minutes. If result is 71 mg/dl to 130 mg/dl: C. Retest patients capillary blood glucose (CBG). D. If result greater than or equal to 110 mg/dl, proceed with HBO. If result less than 110 mg/dl, notify HBO physician and consider holding HBO. If result is 131 mg/dl to 249 mg/dl: A. Proceed with HBO. A. Notify HBO physician and await physician orders. B. It is recommended to hold HBO and do If result is 250 mg/dl or greater: blood/urine ketone testing. C. If the result meets the hospital definition of a critical result, follow hospital policy. POST-HBO GLYCEMIA INTERVENTIONS ACTION INTERVENTION Obtain post HBO capillary blood glucose (ensure 1 physician order is in chart). A. Notify HBO physician and await physician orders. orders. 2 If result is 70 mg/dl or below: B. If the result meets the hospital definition of a critical result, follow hospital policy. A. Give patient an 8 ounce Glucerna Shake, an 8 ounce Ensure, or 8 ounces of a Glucerna/Ensure equivalent dietary supplement*. B. Wait 15 minutes for symptoms of If result is 71 mg/dl to 100 mg/dl: hypoglycemia (i.e. nervousness, anxiety, sweating, chills, clamminess, irritability, confusion, tachycardia or dizziness). C. If  patient asymptomatic, discharge patient. If patient symptomatic, repeat capillary blood glucose (CBG) and notify HBO physician. If result is 101 mg/dl to 249 mg/dl: A. Discharge patient. A. Notify HBO physician and await physician orders. B. It is recommended to do blood/urine ketone If result is 250 mg/dl or greater: testing. C. If the result meets the hospital definition of a critical result, follow hospital policy. *Juice or candies are NOT equivalent products. If patient refuses the Glucerna or Ensure, please consult the hospital dietitian for an appropriate substitute. Electronic Signature(s) Signed: 01/08/2022 3:28:59 PM By: Fredirick Maudlin MD FACS Entered By: Fredirick Maudlin on 01/08/2022 13:45:28 Prescription 01/08/2022 -------------------------------------------------------------------------------- Annice Needy MD Patient Name: Provider: Jan 30, 1935 [id number] Date of Birth: NPI#: Jerilynn Mages [id number] Sex: DEA #: [phone] Phone #: License #: [place] Patient Address: [address] [place]  [place] [address] , [place],  [address] [phone] Allergies No Known Allergies Provider's Orders X-ray, Chest - ICD10: E11.621 - pre hyperbaric oxygen therapy Hand Signature: Date(s): Prescription 01/08/2022 Annice Needy MD Patient Name: Provider: Dec 21, 1934 [id number] Date of Birth: NPI#: Jerilynn Mages [id number] Sex: DEA #: [phone] Phone #: License #: [place] Patient Address: [address] [address] , [place],  [address] [phone] Allergies No Known Allergies Provider's Orders ekg - pre hyperbaric oxygen therapy Hand Signature: Date(s): Electronic Signature(s) Signed: 01/08/2022 1:48:08 PM By: Fredirick Maudlin MD FACS Entered  By: Fredirick Maudlin on 01/08/2022 13:48:07 -------------------------------------------------------------------------------- Problem List Details Patient Name: Date of Service: Justin Staggers [name]. 01/08/2022 12:30 PM Medical Record Number: [id number] Patient Account Number: [id number] Date of Birth/Sex: Treating RN: Jul 02, 1934 (87 y.o. [name] Primary Care Provider: [facility] [place] Other Clinician: Referring Provider: Treating Provider/Extender: Fredirick Maudlin [facility], [facility] We[facility] in Treatment: 2 Active Problems ICD-10 Encounter Code Description Active Date MDM Diagnosis E11.42 Type 2 diabetes mellitus with diabetic polyneuropathy 12/19/2021 No Yes N18.32 Chronic kidney disease, stage 3b 12/19/2021 No Yes I73.9 Peripheral vascular disease, unspecified 12/19/2021 No Yes I25.10 Atherosclerotic heart disease of native coronary artery without angina pectoris 12/19/2021 No Yes I10 Essential (primary) hypertension 12/19/2021 No Yes K86.1 Other chronic pancreatitis 12/19/2021 No Yes E11.621 Type 2 diabetes mellitus with foot ulcer 12/19/2021 No Yes T81.31XS Disruption of external operation (surgical) wound, not elsewhere classified, 12/19/2021 No Yes sequela L97.514 Non-pressure chronic ulcer of other part of right foot with necrosis of bone 12/19/2021 No Yes L97.516 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 01/08/2022 12:30 PM Medical Record Number: [id number] Patient Account Number: [id number] Date of  Birth/Sex: Treating RN: [birth year] (87 y.o. M) Primary Care Provider: [facility] [place] Other Clinician: Referring Provider: Treating Provider/Extender: Fredirick Maudlin [facility], [facility] Weeks in Treatment: 2 Subjective Chief Complaint Information obtained from Patient Patients presents for treatment of open diabetic ulcers and dehiscence of surgical site History of Present Illness (HPI) ADMISSION 12/19/2021 This is an 87 year old man with multiple medical problems including type 2 diabetes mellitus, chronic pancreatitis, coronary artery disease, peripheral vascular disease, stroke and TIA. He was admitted to the hospital on November 15, 2021 with a diabetic foot infection. An MRI was performed on admission that was questionable for early osteomyelitis. He was seen in consultation by both podiatry and vascular surgery. Due to limb threatening ischemia, he underwent a lower extremity angiogram with multiple angioplasties on August 18. He then had amputation of his right second toe on August 20. There was frank pus present which was cultured. He was placed on a course of oral antibiotics. He subsequently was discharged but returned due to altered mental status on September 2. Repeat MRI was performed that was again concerning for osteomyelitis. Infectious disease was consulted. A 4-week course of oral doxycycline and cefadroxil was prescribed. Podiatry referred him to the wound care center for further evaluation and management. On exam, there is a recent right second toe amputation site. The Prolene sutures are gaping and the wound has a  fairly deep cavity; it does probe to bone. There is no purulent drainage or odor, but there is quite a bit of fat necrosis present. On the lateral aspect of his third toe, there is an ulcer that has a leathery eschar overlying it. Underneath, bone and tendon are exposed. No purulent drainage or malodor from this wound, either. 12/25/2021: There is still extensive slough  and fat necrosis in the right second toe amputation site. The lateral third toe wound has also accumulated quite a bit of slough. Today he did not have the prescribed dressing in situ, just a bit of gauze over the wound sites. 01/01/2022: Patient's facility paperwork today indicates that they are requesting a consultation for possible hyperbaric oxygen therapy. Unfortunately there are no other notes available to me from whomever made this request. There is still substantial slough and nonviable subcutaneous tissue present in both of the wounds. There is bone exposed in both sites. He continues on oral antibiotics per infectious disease. 01/08/2022: Both wounds are a bit cleaner today. It bone is still frankly exposed at both sites. The second toe amputation site is deeper, but this is likely secondary to the aggressive debridements I have been performing. We are in the process of evaluating him for hyperbaric oxygen therapy. He has been optimized from a vascular standpoint. His diabetes is suboptimally controlled, with his most recent hemoglobin A1c being 8.9% on December 10, 2021. He continues on oral antibiotics per infectious disease. Patient History Information obtained from Patient, Chart. Family History Diabetes, Heart Disease - Mother, Hypertension - Mother,Siblings, No family history of Cancer, Hereditary Spherocytosis, Kidney Disease, Lung Disease, Seizures, Stroke, Thyroid Problems, Tuberculosis. Social History Former smoker, Marital Status - Married, Alcohol Use - Never, Drug Use - No History, Caffeine Use - Daily - coffee. Medical History Eyes Denies history of Cataracts, Glaucoma, Optic Neuritis Hematologic/Lymphatic Patient has history of Anemia Cardiovascular Patient has history of Coronary Artery Disease, Hypertension, Peripheral Arterial Disease Endocrine Patient has history of Type II Diabetes Denies history of Type I Diabetes Genitourinary Denies history of End Stage Renal  Disease Integumentary (Skin) Denies history of History of Burn Musculoskeletal Patient has history of Osteoarthritis Neurologic Denies history of Neuropathy Oncologic Denies history of Received Chemotherapy, Received Radiation Psychiatric Patient has history of Anorexia/bulimia - poor apetite Denies history of Confinement Anxiety Hospitalization/Surgery History - right 2nd toe amputation 11/26/21. - agram. - peripheral balloon angioplasty 11/24/21. - temporal artery biopsy. - back surgery. - neck surgery. - cholecystectomy. Medical A Surgical History Notes nd Gastrointestinal chronic pancreatitis Genitourinary CKD st 3, testosterone deficiency Neurologic CVA Objective Constitutional Hypertensive, asymptomatic. No acute distress.. Vitals Time Taken: 12:41 PM, Height: 72 in, Weight: 146 lbs, BMI: 19.8, Temperature: 98.3 F, Pulse: 81 bpm, Respiratory Rate: 18 breaths/min, Blood Pressure: 150/76 mmHg. Ears, Nose, Mouth, and Throat Mild cerumen accumulation, but visualized tympanic membranes are clear. Respiratory Normal work of breathing on room air.. General Notes: 01/08/2022: Both wounds are a bit cleaner today. It bone is still frankly exposed at both sites. The second toe amputation site is deeper, but this is likely secondary to the aggressive debridements I have been performing. Integumentary (Hair, Skin) Wound #1 status is Open. Original cause of wound was Surgical Injury. The date acquired was: 11/26/2021. The wound has been in treatment 2 weeks. The wound is located on the Right Amputation Site - [name] The wound measures 2.1cm length x 2.5cm width x 2.3cm depth; 4.123cm^2 area and 9.484cm^3 volume. oe. There is bone and Fat Layer (  Subcutaneous Tissue) exposed. There is no tunneling or undermining noted. There is a medium amount of serosanguineous drainage noted. The wound margin is distinct with the outline attached to the wound base. There is small (1-33%) red granulation within  the wound bed. There is a large (67-100%) amount of necrotic tissue within the wound bed including Adherent Slough. The periwound skin appearance had no abnormalities noted for texture. The periwound skin appearance had no abnormalities noted for moisture. The periwound skin appearance had no abnormalities noted for color. Periwound temperature was noted as No Abnormality. Wound #2 status is Open. Original cause of wound was Gradually Appeared. The date acquired was: 11/26/2021. The wound has been in treatment 2 weeks. The wound is located on the Right [name] Third. The wound measures 0.9cm length x 0.8cm width x 0.1cm depth; 0.565cm^2 area and 0.057cm^3 volume. There is oe bone and Fat Layer (Subcutaneous Tissue) exposed. There is no tunneling or undermining noted. There is a small amount of serosanguineous drainage noted. The wound margin is flat and intact. There is no granulation within the wound bed. There is a large (67-100%) amount of necrotic tissue within the wound bed including Adherent Slough. The periwound skin appearance had no abnormalities noted for texture. The periwound skin appearance had no abnormalities noted for moisture. The periwound skin appearance had no abnormalities noted for color. Periwound temperature was noted as No Abnormality. 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 Their documentation was reviewed and the results copied here: Right lower extremity: Common femoral artery: Patent without stenosis Profunda femoris artery: Patent without stenosis Superficial femoral artery: Patent without stenosis Popliteal artery: Critical behind and below-knee stenosis (greater than 90%) Anterior tibial artery: Occluded Tibioperoneal trunk: Severe stenosis (70%) Peroneal artery: Severe, multifocal stenosis (greatest 70%) Posterior tibial artery: Occluded Pedal circulation: Fills via collateralization from the peroneal artery. Severely disadvantaged in the mid and distal foot. The lesions were treated with: Right popliteal artery drug-coated angioplasty (4x60 Inpact) Right tibioperoneal trunk angioplasty (3 x 220 mm Sterling) Right peroneal artery angioplasty (3x270m Sterling) Completion angiography revealed: Resolution of stenoses and restoration of inline flow to the ankle. Disadvantaged pedal circulation Nutritional management His most recent albumin was low at 2.8 on December 09, 2021. He is currently taking Glucerna protein shakes and making an effort to improve his protein calorie intake through his diet, as well. Glucose control His most recent hemoglobin A1c was elevated at 8.9% on 12/10/2021. He is on Semglee insulin and NovoLog for sliding scale control. Debridement wound bed management Sharp excisional debridement is being performed at each of his clinic visits; in addition we are using topical Iodosorb (Santyl not accessible) to further chemically debride the wounds. Offloading Due to deep pressure injury without wound on his heel, we are unable to place the patient in a forefoot offloading shoe. He is fairly immobile, however and keeps his feet elevated at rest. Control of infection He underwent  surgical debridement and amputation on  August 20. Intraoperative findings included frank purulence. Culture grew out methicillin sensitive Staph aureus. He is currently on oral doxycycline and cefadroxil per infectious disease recommendation. X-Ray results I personally reviewed the plain x-rays performed on December 09, 2021 and concur with the findings copied here: CLINICAL DATA: Evaluate for osteomyelitis. EXAM: RIGHT FOOT - 2 VIEW COMPARISON: Right foot radiograph dated 12/01/2021. FINDINGS: Status post amputation of the second phalanges. There is no acute fracture or dislocation. The bones are osteopenic. Erosive changes of the tuft of the distal phalanx of the great toe as well as erosive changes of the ankle, likely chronic. There is diffuse vascular calcification. Subcutaneous edema and soft tissue swelling of the dorsum of the forefoot. No radiopaque foreign object or subcu gas. IMPRESSION: 1. Status post amputation of the second phalanges. No acute fracture or dislocation. 2. Erosive changes of the tuft of the distal phalanx of the great toe and ankle, likely chronic. MRI or a white blood cell nuclear scan may provide better evaluation if there is clinical concern for acute osteomyelitis. CT scan results CT scan performed on December 10, 2021. I personally have reviewed these results and concur with the radiology interpretation which is copied here: IMPRESSION: 1. Findings in keeping with osteomyelitis/septic arthritis involving the first metatarsal head and base of the first proximal phalanx. 2. Erosive changes involving the second metatarsal head and undersurface of the third metatarsal head suspicious for changes of focal osteomyelitis in these locations. 3. Shallow ulcer involving the distal aspect of the great toe with extensive surrounding soft tissue swelling, particularly involving the plantar soft tissues. 4. Diffuse osteopenia. 5. Advanced vascular calcifications. 6. Marked atrophy of the intrinsic musculature of the  foot. MRI results I personally reviewed the MRI imaging performed on December 10, 2021 and concur with the radiologist interpretation copied here: IMPRESSION: Postsurgical changes of second toe amputation. Marrow signal abnormality and cortical irregularity at the second and third metatarsal heads and third toe proximal phalanx consistent with early osteomyelitis. Soft tissue swelling without evidence of abscess. Minimal marrow signal abnormality dorsal aspect of the great toe metatarsal head at the MTP joint with preserved T1 marrow signal and trace first MTP joint fluid, may suggest subacute osteomyelitis given cortical regularity on recent CT Mild marrow edema in the adjacent first MTP lateral sesamoid, potentially early osteomyelitis or reactive change. . Culture results Cultures taken in the operating room on November 26, 2021 were consistent with methicillin-resistant Staph aureus Labs reviewed CRP Plan of care/Summary This is an 87 year old type II diabetic who had a Wagner 3 diabetic foot ulcer. He underwent imaging consistent with osteomyelitis and underwent surgical debridement. He now has a nonhealing wound. On his right third toe with bone exposed as well as his second toe amputation site. He would benefit from hyperbaric oxygen therapy as he has been optimally revascularized to the extent possible. He will continue to receive aggressive local wound care including sharp excisional debridement and use of chemical or enzymatic debridement agents as indicated. He continues on oral antibiotics for treatment of his osteomyelitis, as dictated by infectious disease experts. Procedures Wound #1 Pre-procedure diagnosis of Wound #1 is a Diabetic Wound/Ulcer of the Lower Extremity located on the Right Amputation Site - [name] .Severity of Tissue Pre oe Debridement is: Necrosis of bone. There was a Excisional Skin/Subcutaneous Tissue Debridement with a total area of 5.25 sq cm performed by  Fredirick Maudlin, MD. With the following instrument(s): Curette to remove Viable and  Non-Viable tissue/material. Material removed includes Subcutaneous Tissue and Slough and after achieving pain control using Lidocaine 4% [name] opical Solution. No specimens were taken. A time out was conducted at 13:00, prior to the start of the procedure. A Minimum amount of bleeding was controlled with Pressure. The procedure was tolerated well with a pain level of 0 throughout and a pain level of 0 following the procedure. Post Debridement Measurements: 2.1cm length x 2.5cm width x 2.1cm depth; 8.659cm^3 volume. Character of Wound/Ulcer Post Debridement is improved. Severity of Tissue Post Debridement is: Necrosis of bone. Post procedure Diagnosis Wound #1: Same as Pre-Procedure General Notes: scribed by Baruch Gouty, RN for Dr. Celine Ahr. Wound #2 Pre-procedure diagnosis of Wound #2 is a Diabetic Wound/Ulcer of the Lower Extremity located on the Right [name] Third .Severity of Tissue Pre Debridement is: oe Necrosis of bone. There was a Excisional Skin/Subcutaneous Tissue Debridement with a total area of 0.72 sq cm performed by Fredirick Maudlin, MD. With the following instrument(s): Curette to remove Viable and Non-Viable tissue/material. Material removed includes Subcutaneous Tissue and Slough and after achieving pain control using Lidocaine 4% [name] opical Solution. No specimens were taken. A time out was conducted at 13:00, prior to the start of the procedure. A Minimum amount of bleeding was controlled with Pressure. The procedure was tolerated well with a pain level of 0 throughout and a pain level of 0 following the procedure. Post Debridement Measurements: 0.9cm length x 0.8cm width x 0.1cm depth; 0.057cm^3 volume. Character of Wound/Ulcer Post Debridement is improved. Severity of Tissue Post Debridement is: Necrosis of bone. Post procedure Diagnosis Wound #2: Same as Pre-Procedure General Notes: scribed by Baruch Gouty, RN for Dr. Celine Ahr. Plan Follow-up Appointments: Return Appointment in 1 week. - Dr. Celine Ahr RM 1 with Vaughan Basta Anesthetic: Wound #1 Right Amputation Site - [name]: (In clinic) Topical Lidocaine 4% applied to wound bed Wound #2 Right [name] Third: oe (In clinic) Topical Lidocaine 4% applied to wound bed Bathing/ Shower/ Hygiene: Other Bathing/Shower/Hygiene Orders/Instructions: - may wash feet with soap and water with dressing changes Off-Loading: Heel suspension boot to: - podus boots bilateral feet Non Wound Condition: Cleanse area with: - clean left ear with hydrogen peroxide and water Hyperbaric Oxygen Therapy: Evaluate for HBO Therapy Indication: - osteomyelitis; Wagner 3 diabetic foot ulcer If appropriate for treatment, begin HBOT per protocol: 2.5 ATA for 90 Minutes with 2 Five (5) Minute Air Breaks [name] Number of Treatments: - 40 otal One treatments per day (delivered Monday through Friday unless otherwise specified in Special Instructions below): Finger stick Blood Glucose Pre- and Post- HBOT Treatment. Follow Hyperbaric Oxygen Glycemia Protocol Afrin (Oxymetazoline HCL) 0.05% nasal spray - 1 spray in both nostrils daily as needed prior to HBO treatment for difficulty clearing ears Radiology ordered were: X-ray, Chest - pre hyperbaric oxygen therapy ordered were: ekg - pre hyperbaric oxygen therapy WOUND #1: - Amputation Site - [name] Wound Laterality: Right Cleanser: Soap and Water 1 x Per Day/30 Days Discharge Instructions: May shower and wash wound with dial antibacterial soap and water prior to dressing change. Cleanser: Wound Cleanser 1 x Per Day/30 Days Discharge Instructions: Cleanse the wound with wound cleanser prior to applying a clean dressing using gauze sponges, not tissue or cotton balls. Peri-Wound Care: Sween Lotion (Moisturizing lotion) 1 x Per Day/30 Days Discharge Instructions: Apply moisturizing lotion to both feet with dressing changes Prim Dressing:  Iodosorb Gel 10 (gm) Tube 1 x Per Day/30 Days ary Discharge Instructions: Apply to  bottom of wound bed, silver alginate to fill dead space Secondary Dressing: Woven Gauze Sponge, Non-Sterile 4x4 in 1 x Per Day/30 Days Discharge Instructions: Moisten with saline and apply over primary dressing as directed. Secured With: [employer], 4.5x3.1 (in/yd) 1 x Per Day/30 Days Discharge Instructions: Secure with Kerlix as directed. Secured With: 48M Medipore H Soft Cloth Surgical [name] ape, 4 x 10 (in/yd) 1 x Per Day/30 Days Discharge Instructions: Secure with tape as directed. WOUND #2: - [name] Third Wound Laterality: Right oe Cleanser: Soap and Water 1 x Per Day/30 Days Discharge Instructions: May shower and wash wound with dial antibacterial soap and water prior to dressing change. Cleanser: Wound Cleanser 1 x Per Day/30 Days Discharge Instructions: Cleanse the wound with wound cleanser prior to applying a clean dressing using gauze sponges, not tissue or cotton balls. Peri-Wound Care: Sween Lotion (Moisturizing lotion) 1 x Per Day/30 Days Discharge Instructions: Apply moisturizing lotion to both feet with dressing changes Prim Dressing: Iodosorb Gel 10 (gm) Tube 1 x Per Day/30 Days ary Discharge Instructions: Apply to wound bed as instructed Secondary Dressing: Woven Gauze Sponge, Non-Sterile 4x4 in 1 x Per Day/30 Days Discharge Instructions: Moisten with saline and apply over primary dressing as directed. Secured With: [employer], 4.5x3.1 (in/yd) 1 x Per Day/30 Days Discharge Instructions: Secure with Kerlix as directed. Secured With: 48M Medipore H Soft Cloth Surgical [name] ape, 4 x 10 (in/yd) 1 x Per Day/30 Days Discharge Instructions: Secure with tape as directed. 01/08/2022: Both wounds are a bit cleaner today. It bone is still frankly exposed at both sites. The second toe amputation site is deeper, but this is likely secondary to the aggressive debridements I have been performing. I  debrided slough and nonviable subcutaneous tissue from both the second and third toes. We will continue using Iodosorb for additional chemical debridement. Once we have insurance approval, we will initiate hyperbaric oxygen therapy. We will continue aggressive local wound care along with the hyperbaric oxygen.Marland Kitchen He does need a chest x-ray and EKG, which we have ordered. He will continue his oral antibiotics as prescribed by infectious disease. Follow-up in 1 week. Electronic Signature(s) Signed: 01/08/2022 2:12:30 PM By: Fredirick Maudlin MD FACS Previous Signature: 01/08/2022 1:47:32 PM Version By: Fredirick Maudlin MD FACS Entered By: Fredirick Maudlin on 01/08/2022 14:12:30 -------------------------------------------------------------------------------- HxROS Details Patient Name: Date of Service: Justin Staggers [name]. 01/08/2022 12:30 PM Medical Record Number: [id number] Patient Account Number: [id number] Date of Birth/Sex: Treating RN: Aug 16, 1934 (87 y.o. M) Primary Care Provider: [facility] [place] Other Clinician: Referring Provider: Treating Provider/Extender: Fredirick Maudlin [facility] [place] Weeks in Treatment: 2 Information Obtained From Patient Chart Eyes Medical History: Negative for: Cataracts; Glaucoma; Optic Neuritis Hematologic/Lymphatic Medical History: Positive for: Anemia Cardiovascular Medical History: Positive for: Coronary Artery Disease; Hypertension; Peripheral Arterial Disease Gastrointestinal Medical History: Past Medical History Notes: chronic pancreatitis Endocrine Medical History: Positive for: Type II Diabetes Negative for: Type I Diabetes Treated with: Insulin Blood sugar tested every day: Yes Tested : 2 times per day Genitourinary Medical History: Negative for: End Stage Renal Disease Past Medical History Notes: CKD st 3, testosterone deficiency Integumentary (Skin) Medical History: Negative for: History of Burn Musculoskeletal Medical  History: Positive for: Osteoarthritis Neurologic Medical History: Negative for: Neuropathy Past Medical History Notes: CVA Oncologic Medical History: Negative for: Received Chemotherapy; Received Radiation Psychiatric Medical History: Positive for: Anorexia/bulimia - poor apetite Negative for: Confinement Anxiety Immunizations Pneumococcal Vaccine: Received Pneumococcal Vaccination: Yes Received Pneumococcal Vaccination On or After 60th  Birthday: Yes Implantable Devices No devices added Hospitalization / Surgery History Type of Hospitalization/Surgery right 2nd toe amputation 11/26/21 [place] peripheral balloon angioplasty 11/24/21 temporal artery biopsy back surgery neck surgery cholecystectomy Family and Social History Cancer: No; Diabetes: Yes; Heart Disease: Yes - Mother; Hereditary Spherocytosis: No; Hypertension: Yes - Mother,Siblings; Kidney Disease: No; Lung Disease: No; Seizures: No; Stroke: No; Thyroid Problems: No; Tuberculosis: No; Former smoker; Marital Status - Married; Alcohol Use: Never; Drug Use: No History; Caffeine Use: Daily - coffee; Financial Concerns: No; Food, Clothing or Shelter Needs: No; Support System Lacking: No; Transportation Concerns: No Electronic Signature(s) Signed: 01/08/2022 3:28:59 PM By: Fredirick Maudlin MD FACS Entered By: Fredirick Maudlin on 01/08/2022 13:41:50 -------------------------------------------------------------------------------- SuperBill Details Patient Name: Date of Service: Justin [name] 01/08/2022 Medical Record Number: [id number] Patient Account Number: [id number] Date of Birth/Sex: Treating RN: [birth year] (87 y.o. M) Primary Care Provider: [facility] [place] Other Clinician: Referring Provider: Treating Provider/Extender: Fredirick Maudlin [facility], [place] Weeks in Treatment: 2 Diagnosis Coding ICD-10 Codes Code Description E11.42 Type 2 diabetes mellitus with diabetic polyneuropathy N18.32 Chronic kidney  disease, stage 3b I73.9 Peripheral vascular disease, unspecified I25.10 Atherosclerotic heart disease of native coronary artery without angina pectoris I10 Essential (primary) hypertension K86.1 Other chronic pancreatitis E11.621 Type 2 diabetes mellitus with foot ulcer T81.31XS Disruption of external operation (surgical) wound, not elsewhere classified, sequela 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 of necrosis M86.171 Other acute osteomyelitis, right ankle and foot Facility Procedures CPT4 Code: [id number] Description: 30865 - DEB SUBQ TISSUE 20 SQ CM/< ICD-10 Diagnosis Description L97.514 Non-pressure chronic ulcer of other part of right foot with necrosis of bone L97.516 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 wit Modifier: hout evidence of nec Quantity: 1 rosis Physician Procedures : CPT4 Code Description Modifier [id number] - WC PHYS LEVEL 4 - EST PT 25 ICD-10 Diagnosis Description L97.514 Non-pressure chronic ulcer of other part of right foot with necrosis of bone L97.516 Non-pressure chronic ulcer of other part of right foot  with bone involvement without evidence of nec E11.621 Type 2 diabetes mellitus with foot ulcer M86.171 Other acute osteomyelitis, right ankle and foot Quantity: 1 rosis : [id number] - WC PHYS SUBQ TISS 20 SQ CM ICD-10 Diagnosis Description L97.514 Non-pressure chronic ulcer of other part of right foot with necrosis of bone L97.516 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 without  evidence of nec Quantity: 1 rosis Electronic Signature(s) Signed: 01/08/2022 1:48:03 PM By: Fredirick Maudlin MD FACS Entered By: Fredirick Maudlin on 01/08/2022 13:48:03

## 2022-01-08 NOTE — Progress Notes (Signed)
HERSHY, FLENNER T ([id number]) . Visit Report for 01/08/2022 Arrival Information Details Patient Name: Date of Service: Justin [name], Justin [name] 01/08/2022 12:30 PM Medical Record Number: [id number] Patient Account Number: [id number] Date of Birth/Sex: Treating RN: 07-17-1934 (87 y.o. [name] Primary Care Aamilah Augenstein: Center, Romelle Starcher Other Clinician: Referring Cartel Mauss: Treating Glendora Clouatre/Extender: Fredirick Maudlin [facility], [place] Weeks in Treatment: 2 Visit Information History Since Last Visit Added or deleted any medications: No Patient Arrived: Wheel Chair Any new allergies or adverse reactions: No Arrival Time: 12:41 Had a fall or experienced change in No Accompanied By: granddaughter activities of daily living that may affect Transfer Assistance: Manual risk of falls: Patient Identification Verified: Yes Signs or symptoms of abuse/neglect since last visito No Secondary Verification Process Completed: Yes Hospitalized since last visit: No Patient Requires Transmission-Based Precautions: No Implantable device outside of the clinic excluding No Patient Has Alerts: Yes cellular tissue based products placed in the center Patient Alerts: R ABI =.75, TBI= .34 since last visit: L ABI= .86, TBI= 1.02 Has Dressing in Place as Prescribed: Yes Has Footwear/Offloading in Place as Prescribed: Yes Left: Multipodus Split/Boot Right: Multipodus Split/Boot Pain Present Now: No Electronic Signature(s) Signed: 01/08/2022 5:15:44 PM By: Baruch Gouty RN, BSN Entered By: Baruch Gouty on 01/08/2022 12:41:50 -------------------------------------------------------------------------------- Encounter Discharge Information Details Patient Name: Date of Service: Justin Staggers T. 01/08/2022 12:30 PM Medical Record Number: [id number] Patient Account Number: [id number] Date of Birth/Sex: Treating RN: 05-Dec-1934 (87 y.o. [name] Primary Care Kepler Mccabe: Center, Romelle Starcher Other  Clinician: Referring Antwian Santaana: Treating Marvis Saefong/Extender: Fredirick Maudlin [facility], [place] Weeks in Treatment: 2 Encounter Discharge Information Items Post Procedure Vitals Discharge Condition: Stable Temperature (F): 98.3 Ambulatory Status: Wheelchair Pulse (bpm): 81 Discharge Destination: [place] Respiratory Rate (breaths/min): 18 Telephoned: No Blood Pressure (mmHg): 150/76 Orders Sent: Yes Transportation: Other Accompanied By: granddaughter Schedule Follow-up Appointment: Yes Clinical Summary of Care: Patient Declined Notes facility transportation Electronic Signature(s) Signed: 01/08/2022 5:15:44 PM By: Baruch Gouty RN, BSN Entered By: Baruch Gouty on 01/08/2022 13:33:47 -------------------------------------------------------------------------------- Lower Extremity Assessment Details Patient Name: Date of Service: Justin Grant Fontana T. 01/08/2022 12:30 PM Medical Record Number: [id number] Patient Account Number: [id number] Date of Birth/Sex: Treating RN: 04-30-1934 (87 y.o. [name] Primary Care Varetta Chavers: [place], [place] Other Clinician: Referring Terriann Difonzo: Treating Kaydan Wilhoite/Extender: Fredirick Maudlin [facility], [place] Weeks in Treatment: 2 Edema Assessment Assessed: [Left: No] [Right: No] Edema: [Left: N] [Right: o] Calf Left: Right: Point of Measurement: From Medial Instep 31 cm Ankle Left: Right: Point of Measurement: From Medial Instep 20.5 cm Vascular Assessment Pulses: Dorsalis Pedis Palpable: [Right:Yes] Electronic Signature(s) Signed: 01/08/2022 5:15:44 PM By: Baruch Gouty RN, BSN Entered By: Baruch Gouty on 01/08/2022 12:51:53 -------------------------------------------------------------------------------- Multi Wound Chart Details Patient Name: Date of Service: Justin Staggers T. 01/08/2022 12:30 PM Medical Record Number: [id number] Patient Account Number: [id number] Date of Birth/Sex: Treating  RN: 16-Feb-1935 (87 y.o. M) Primary Care Pavel Gadd: [place], [place] Other Clinician: Referring [name]: Treating Anneta Rounds/Extender: Fredirick Maudlin [facility], [place] Weeks in Treatment: 2 Vital Signs Height(in): 72 Pulse(bpm): 81 Weight(lbs): 146 Blood Pressure(mmHg): 150/76 Body Mass Index(BMI): 19.8 Temperature(F): 98.3 Respiratory Rate(breaths/min): 18 Photos: [1:Right Amputation Site - T oe] [2:Right T Third oe] [N/A:N/A N/A] Wound Location: [1:Surgical Injury] [2:Gradually Appeared] [N/A:N/A] Wounding Event: [1:Diabetic Wound/Ulcer of the Lower] [2:Diabetic Wound/Ulcer of the Lower] [N/A:N/A] Primary Etiology: [1:Extremity Open Surgical Wound] [2:Extremity Arterial Insufficiency Ulcer] [N/A:N/A] Secondary Etiology: [1:Anemia, Coronary Artery Disease,] [2:Anemia, Coronary Artery Disease,] [N/A:N/A] Comorbid History: [1:Hypertension, Peripheral Arterial Disease, Type  II Diabetes, Osteoarthritis, Anorexia/bulimia 11/26/2021] [2:Hypertension, Peripheral Arterial Disease, Type II Diabetes, Osteoarthritis, Anorexia/bulimia 11/26/2021] [N/A:N/A] Date Acquired: [1:2] [2:2] [N/A:N/A] Weeks of Treatment: [1:Open] [2:Open] [N/A:N/A] Wound Status: [1:No] [2:No] [N/A:N/A] Wound Recurrence: [1:2.1x2.5x2.3] [2:0.9x0.8x0.1] [N/A:N/A] Measurements L x W x D (cm) [1:4.123] [2:0.565] [N/A:N/A] A (cm) : rea [1:9.484] [2:0.057] [N/A:N/A] Volume (cm) : [1:-25.00%] [2:20.10%] [N/A:N/A] % Reduction in A [1:rea: -36.90%] [2:19.70%] [N/A:N/A] % Reduction in Volume: [1:Grade 3] [2:Grade 3] [N/A:N/A] Classification: [1:Medium] [2:Small] [N/A:N/A] Exudate A mount: [1:Serosanguineous] [2:Serosanguineous] [N/A:N/A] Exudate Type: [1:red, brown] [2:red, brown] [N/A:N/A] Exudate Color: [1:Distinct, outline attached] [2:Flat and Intact] [N/A:N/A] Wound Margin: [1:Small (1-33%)] [2:None Present (0%)] [N/A:N/A] Granulation A mount: [1:Red] [2:N/A] [N/A:N/A] Granulation Quality: [1:Large (67-100%)] [2:Large  (67-100%)] [N/A:N/A] Necrotic A mount: [1:Fat Layer (Subcutaneous Tissue): Yes Fat Layer (Subcutaneous Tissue): Yes N/A] Exposed Structures: [1:Bone: Yes Fascia: No Tendon: No Muscle: No Joint: No Small (1-33%)] [2:Bone: Yes Fascia: No Tendon: No Muscle: No Joint: No Small (1-33%)] [N/A:N/A] Epithelialization: [1:Debridement - Excisional] [2:Debridement - Excisional] [N/A:N/A] Debridement: Pre-procedure Verification/Time Out 13:00 [2:13:00] [N/A:N/A] Taken: [1:Lidocaine 4% Topical Solution] [2:Lidocaine 4% Topical Solution] [N/A:N/A] Pain Control: [1:Subcutaneous, Slough] [2:Subcutaneous, Slough] [N/A:N/A] Tissue Debrided: [1:Skin/Subcutaneous Tissue] [2:Skin/Subcutaneous Tissue] [N/A:N/A] Level: [1:5.25] [2:0.72] [N/A:N/A] Debridement A (sq cm): [1:rea Curette] [2:Curette] [N/A:N/A] Instrument: [1:Minimum] [2:Minimum] [N/A:N/A] Bleeding: [1:Pressure] [2:Pressure] [N/A:N/A] Hemostasis A chieved: [1:0] [2:0] [N/A:N/A] Procedural Pain: [1:0] [2:0] [N/A:N/A] Post Procedural Pain: [1:Procedure was tolerated well] [2:Procedure was tolerated well] [N/A:N/A] Debridement Treatment Response: [1:2.1x2.5x2.1] [2:0.9x0.8x0.1] [N/A:N/A] Post Debridement Measurements L x W x D (cm) [1:8.659] [2:0.057] [N/A:N/A] Post Debridement Volume: (cm) [1:No Abnormalities Noted] [2:No Abnormalities Noted] [N/A:N/A] Periwound Skin Texture: [1:No Abnormalities Noted] [2:No Abnormalities Noted] [N/A:N/A] Periwound Skin Moisture: [1:No Abnormalities Noted] [2:No Abnormalities Noted] [N/A:N/A] Periwound Skin Color: [1:No Abnormality] [2:No Abnormality] [N/A:N/A] Temperature: [1:Debridement] [2:Debridement] [N/A:N/A] Treatment Notes Wound #1 (Amputation Site - Toe) Wound Laterality: Right Cleanser Soap and Water Discharge Instruction: May shower and wash wound with dial antibacterial soap and water prior to dressing change. Wound Cleanser Discharge Instruction: Cleanse the wound with wound cleanser prior to  applying a clean dressing using gauze sponges, not tissue or cotton balls. Peri-Wound Care Sween Lotion (Moisturizing lotion) Discharge Instruction: Apply moisturizing lotion to both feet with dressing changes Topical Primary Dressing Iodosorb Gel 10 (gm) Tube Discharge Instruction: Apply to bottom of wound bed, silver alginate to fill dead space Secondary Dressing Woven Gauze Sponge, Non-Sterile 4x4 in Discharge Instruction: Moisten with saline and apply over primary dressing as directed. Secured With [employer], 4.5x3.1 (in/yd) Discharge Instruction: Secure with Kerlix as directed. 72M Medipore H Soft Cloth Surgical T ape, 4 x 10 (in/yd) Discharge Instruction: Secure with tape as directed. Compression Wrap Compression Stockings Add-Ons Wound #2 (Toe Third) Wound Laterality: Right Cleanser Soap and Water Discharge Instruction: May shower and wash wound with dial antibacterial soap and water prior to dressing change. Wound Cleanser Discharge Instruction: Cleanse the wound with wound cleanser prior to applying a clean dressing using gauze sponges, not tissue or cotton balls. Peri-Wound Care Sween Lotion (Moisturizing lotion) Discharge Instruction: Apply moisturizing lotion to both feet with dressing changes Topical Primary Dressing Iodosorb Gel 10 (gm) Tube Discharge Instruction: Apply to wound bed as instructed Secondary Dressing Woven Gauze Sponge, Non-Sterile 4x4 in Discharge Instruction: Moisten with saline and apply over primary dressing as directed. Secured With [employer], 4.5x3.1 (in/yd) Discharge Instruction: Secure with Kerlix as directed. 72M Medipore H Soft Cloth Surgical T ape, 4 x 10 (in/yd) Discharge Instruction: Secure with tape as  directed. Compression Wrap Compression Stockings Add-Ons Electronic Signature(s) Signed: 01/08/2022 1:37:58 PM By: Fredirick Maudlin MD FACS Entered By: Fredirick Maudlin on 01/08/2022  13:37:57 -------------------------------------------------------------------------------- Multi-Disciplinary Care Plan Details Patient Name: Date of Service: Justin Staggers T. 01/08/2022 12:30 PM Medical Record Number: [id number] Patient Account Number: [id number] Date of Birth/Sex: Treating RN: 10/12/1934 (87 y.o. [name] Primary Care Lovell Roe: [place], [place] Other Clinician: Referring Semira Stoltzfus: Treating Gabrielle Mester/Extender: Fredirick Maudlin [facility], Jairo Ben in Treatment: 2 [place] reviewed with physician Active Inactive HBO Nursing Diagnoses: Anxiety related to knowledge deficit of hyperbaric oxygen therapy and treatment procedures Potential for barotraumas to ears, sinuses, teeth, and lungs or cerebral gas embolism related to changes in atmospheric pressure inside hyperbaric oxygen chamber Potential for oxygen toxicity seizures related to delivery of 100% oxygen at an increased atmospheric pressure Potential for pulmonary oxygen toxicity related to delivery of 100% oxygen at an increased atmospheric pressure Goals: Barotrauma will be prevented during HBO2 Date Initiated: 01/08/2022 T arget Resolution Date: 02/05/2022 Goal Status: Active Patient and/or family will be able to state/discuss factors appropriate to the management of their disease process during treatment Date Initiated: 01/08/2022 T arget Resolution Date: 02/05/2022 Goal Status: Active Patient will tolerate the hyperbaric oxygen therapy treatment Date Initiated: 01/08/2022 T arget Resolution Date: 02/05/2022 Goal Status: Active Patient will tolerate the internal climate of the chamber Date Initiated: 01/08/2022 T arget Resolution Date: 02/05/2022 Goal Status: Active Patient/caregiver will verbalize understanding of HBO goals, rationale, procedures and potential hazards Date Initiated: 01/08/2022 T arget Resolution Date: 02/05/2022 Goal Status: Active Signs and symptoms of  pulmonary oxygen toxicity will be recognized and promptly addressed Date Initiated: 01/08/2022 T arget Resolution Date: 02/05/2022 Goal Status: Active Signs and symptoms of seizure will be recognized and promptly addressed ; seizing patients will suffer no harm Date Initiated: 01/08/2022 T arget Resolution Date: 02/05/2022 Goal Status: Active Interventions: Administer decongestants, per physician orders, prior to HBO2 Administer the correct therapeutic gas delivery based on the patients needs and limitations, per physician order Assess and provide for patients comfort related to the hyperbaric environment and equalization of middle ear Assess for signs and symptoms related to adverse events, including but not limited to confinement anxiety, pneumothorax, oxygen toxicity and baurotrauma Assess patient for any history of confinement anxiety Assess patient's knowledge and expectations regarding hyperbaric medicine and provide education related to the hyperbaric environment, goals of treatment and prevention of adverse events Implement protocols to decrease risk of pneumothorax in high risk patients Notes: Nutrition Nursing Diagnoses: Impaired glucose control: actual or potential Potential for alteratiion in Nutrition/Potential for imbalanced nutrition Goals: Patient/caregiver will maintain therapeutic glucose control Date Initiated: 12/19/2021 Target Resolution Date: 01/16/2022 Goal Status: Active Interventions: Assess HgA1c results as ordered upon admission and as needed Assess patient nutrition upon admission and as needed per policy Provide education on elevated blood sugars and impact on wound healing Treatment Activities: Patient referred to Primary Care Physician for further nutritional evaluation : 12/19/2021 Notes: Osteomyelitis Nursing Diagnoses: Infection: osteomyelitis Knowledge deficit related to disease process and management Goals: Patient/caregiver will verbalize  understanding of disease process and disease management Date Initiated: 12/19/2021 Target Resolution Date: 01/16/2022 Goal Status: Active Patient's osteomyelitis will resolve Date Initiated: 12/19/2021 Target Resolution Date: 01/16/2022 Goal Status: Active Interventions: Assess for signs and symptoms of osteomyelitis resolution every visit Provide education on osteomyelitis Treatment Activities: Consult for HBO : 12/19/2021 Surgical debridement : 12/19/2021 Systemic antibiotics : 12/19/2021 T ordered outside of clinic : 12/19/2021 est Notes: Wound/Skin Impairment  Nursing Diagnoses: Impaired tissue integrity Knowledge deficit related to ulceration/compromised skin integrity Goals: Patient/caregiver will verbalize understanding of skin care regimen Date Initiated: 12/19/2021 Target Resolution Date: 01/16/2022 Goal Status: Active Ulcer/skin breakdown will have a volume reduction of 30% by week 4 Date Initiated: 12/19/2021 Target Resolution Date: 01/16/2022 Goal Status: Active Interventions: Assess patient/caregiver ability to obtain necessary supplies Assess patient/caregiver ability to perform ulcer/skin care regimen upon admission and as needed Assess ulceration(s) every visit Provide education on ulcer and skin care Treatment Activities: Skin care regimen initiated : 12/19/2021 Topical wound management initiated : 12/19/2021 Notes: Electronic Signature(s) Signed: 01/08/2022 5:15:44 PM By: Baruch Gouty RN, BSN Entered By: Baruch Gouty on 01/08/2022 13:03:14 -------------------------------------------------------------------------------- Pain Assessment Details Patient Name: Date of Service: Justin Staggers T. 01/08/2022 12:30 PM Medical Record Number: [id number] Patient Account Number: [id number] Date of Birth/Sex: Treating RN: [birth year] (87 y.o. [name] Primary Care Malloree Raboin: [place] Other Clinician: Referring Rickita Forstner: Treating Halah Whiteside/Extender:  Fredirick Maudlin [facility], [place] Weeks in Treatment: 2 Active Problems Location of Pain Severity and Description of Pain Patient Has Paino No Site Locations Rate the pain. Rate the pain. Current Pain Level: 0 Pain Management and Medication Current Pain Management: Electronic Signature(s) Signed: 01/08/2022 5:15:44 PM By: Baruch Gouty RN, BSN Entered By: Baruch Gouty on 01/08/2022 12:47:55 -------------------------------------------------------------------------------- Patient/Caregiver Education Details Patient Name: Date of Service: Justin Frederich Balding 10/2/2023andnbsp12:30 PM Medical Record Number: [id number] Patient Account Number: [id number] Date of Birth/Gender: Treating RN: [birth year] (87 y.o. [name] Primary Care Physician: [place], [place] Other Clinician: Referring Physician: Treating Physician/Extender: Fredirick Maudlin [facility], J[place] Ben in Treatment: 2 Education Assessment Education Provided To: Patient Education Topics Provided Elevated Blood Sugar/ Impact on Healing: Methods: Explain/Verbal Responses: Reinforcements needed, State content correctly Infection: Methods: Explain/Verbal Responses: Reinforcements needed, State content correctly Wound/Skin Impairment: Methods: Explain/Verbal Responses: Reinforcements needed, State content correctly Electronic Signature(s) Signed: 01/08/2022 5:15:44 PM By: Baruch Gouty RN, BSN Entered By: Baruch Gouty on 01/08/2022 13:04:10 -------------------------------------------------------------------------------- Wound Assessment Details Patient Name: Date of Service: Justin Staggers T. 01/08/2022 12:30 PM Medical Record Number: [id number] Patient Account Number: [id number] Date of Birth/Sex: Treating RN: Aug 19, 1934 (87 y.o. [name] Primary Care Annamarie Yamaguchi: [place], [place] Other Clinician: Referring Nema Oatley: Treating Myeisha Kruser/Extender: Fredirick Maudlin [facility], [place] Weeks in  Treatment: 2 Wound Status Wound Number: 1 Primary Diabetic Wound/Ulcer of the Lower Extremity Etiology: Wound Location: Right Amputation Site - Toe Secondary Open Surgical Wound Wounding Event: Surgical Injury Etiology: Date Acquired: 11/26/2021 Wound Open Weeks Of Treatment: 2 Status: Clustered Wound: No Comorbid Anemia, Coronary Artery Disease, Hypertension, Peripheral History: Arterial Disease, Type II Diabetes, Osteoarthritis, Anorexia/bulimia Photos Wound Measurements Length: (cm) 2.1 Width: (cm) 2.5 Depth: (cm) 2.3 Area: (cm) 4.123 Volume: (cm) 9.484 % Reduction in Area: -25% % Reduction in Volume: -36.9% Epithelialization: Small (1-33%) Tunneling: No Undermining: No Wound Description Classification: Grade 3 Wound Margin: Distinct, outline attached Exudate Amount: Medium Exudate Type: Serosanguineous Exudate Color: red, brown Foul Odor After Cleansing: No Slough/Fibrino Yes Wound Bed Granulation Amount: Small (1-33%) Exposed Structure Granulation Quality: Red Fascia Exposed: No Necrotic Amount: Large (67-100%) Fat Layer (Subcutaneous Tissue) Exposed: Yes Necrotic Quality: Adherent Slough Tendon Exposed: No Muscle Exposed: No Joint Exposed: No Bone Exposed: Yes Periwound Skin Texture Texture Color No Abnormalities Noted: Yes No Abnormalities Noted: Yes Moisture Temperature / Pain No Abnormalities Noted: Yes Temperature: No Abnormality Treatment Notes Wound #1 (Amputation Site - Toe) Wound Laterality: Right Cleanser Soap and Water Discharge Instruction: May shower and wash wound  with dial antibacterial soap and water prior to dressing change. Wound Cleanser Discharge Instruction: Cleanse the wound with wound cleanser prior to applying a clean dressing using gauze sponges, not tissue or cotton balls. Peri-Wound Care Sween Lotion (Moisturizing lotion) Discharge Instruction: Apply moisturizing lotion to both feet with dressing changes Topical Primary  Dressing Iodosorb Gel 10 (gm) Tube Discharge Instruction: Apply to bottom of wound bed, silver alginate to fill dead space Secondary Dressing Woven Gauze Sponge, Non-Sterile 4x4 in Discharge Instruction: Moisten with saline and apply over primary dressing as directed. Secured With [employer], 4.5x3.1 (in/yd) Discharge Instruction: Secure with Kerlix as directed. 56M Medipore H Soft Cloth Surgical T ape, 4 x 10 (in/yd) Discharge Instruction: Secure with tape as directed. Compression Wrap Compression Stockings Add-Ons Electronic Signature(s) Signed: 01/08/2022 5:15:44 PM By: Baruch Gouty RN, BSN Entered By: Baruch Gouty on 01/08/2022 12:54:28 -------------------------------------------------------------------------------- Wound Assessment Details Patient Name: Date of Service: Justin Staggers T. 01/08/2022 12:30 PM Medical Record Number: [id number] Patient Account Number: [id number] Date of Birth/Sex: Treating RN: April 13, 1934 (87 y.o. [name] Primary Care Smith Mcnicholas: [place] Other Clinician: Referring Brendy Ficek: Treating Forrester Blando/Extender: Fredirick [facility], [place] Weeks in Treatment: 2 Wound Status Wound Number: 2 Primary Diabetic Wound/Ulcer of the Lower Extremity Etiology: Wound Location: Right T Third oe Secondary Arterial Insufficiency Ulcer Wounding Event: Gradually Appeared Etiology: Date Acquired: 11/26/2021 Wound Open Weeks Of Treatment: 2 Status: Clustered Wound: No Comorbid Anemia, Coronary Artery Disease, Hypertension, Peripheral History: Arterial Disease, Type II Diabetes, Osteoarthritis, Anorexia/bulimia Photos Wound Measurements Length: (cm) 0.9 Width: (cm) 0.8 Depth: (cm) 0.1 Area: (cm) 0.565 Volume: (cm) 0.057 % Reduction in Area: 20.1% % Reduction in Volume: 19.7% Epithelialization: Small (1-33%) Tunneling: No Undermining: No Wound Description Classification: Grade 3 Wound Margin: Flat and Intact Exudate  Amount: Small Exudate Type: Serosanguineous Exudate Color: red, brown Foul Odor After Cleansing: No Slough/Fibrino Yes Wound Bed Granulation Amount: None Present (0%) Exposed Structure Necrotic Amount: Large (67-100%) Fascia Exposed: No Necrotic Quality: Adherent Slough Fat Layer (Subcutaneous Tissue) Exposed: Yes Tendon Exposed: No Muscle Exposed: No Joint Exposed: No Bone Exposed: Yes Periwound Skin Texture Texture Color No Abnormalities Noted: Yes No Abnormalities Noted: Yes Moisture Temperature / Pain No Abnormalities Noted: Yes Temperature: No Abnormality Treatment Notes Wound #2 (Toe Third) Wound Laterality: Right Cleanser Soap and Water Discharge Instruction: May shower and wash wound with dial antibacterial soap and water prior to dressing change. Wound Cleanser Discharge Instruction: Cleanse the wound with wound cleanser prior to applying a clean dressing using gauze sponges, not tissue or cotton balls. Peri-Wound Care Sween Lotion (Moisturizing lotion) Discharge Instruction: Apply moisturizing lotion to both feet with dressing changes Topical Primary Dressing Iodosorb Gel 10 (gm) Tube Discharge Instruction: Apply to wound bed as instructed Secondary Dressing Woven Gauze Sponge, Non-Sterile 4x4 in Discharge Instruction: Moisten with saline and apply over primary dressing as directed. Secured With [employer], 4.5x3.1 (in/yd) Discharge Instruction: Secure with Kerlix as directed. 56M Medipore H Soft Cloth Surgical T ape, 4 x 10 (in/yd) Discharge Instruction: Secure with tape as directed. Compression Wrap Compression Stockings Add-Ons Electronic Signature(s) Signed: 01/08/2022 5:15:44 PM By: Baruch Gouty RN, BSN Entered By: Baruch Gouty on 01/08/2022 12:54:54 -------------------------------------------------------------------------------- Vitals Details Patient Name: Date of Service: Justin Staggers T. 01/08/2022 12:30 PM Medical Record  Number: [id number] Patient Account Number: [id number] Date of Birth/Sex: Treating RN: 27-May-1934 (87 y.o. [name] Primary Care Verbon Giangregorio: [place] Other Clinician: Referring Myles Mallicoat: Treating Jeromiah Ohalloran/Extender: Bernell List, [place]  Weeks in Treatment: 2 Vital Signs Time Taken: 12:41 Temperature (F): 98.3 Height (in): 72 Pulse (bpm): 81 Weight (lbs): 146 Respiratory Rate (breaths/min): 18 Body Mass Index (BMI): 19.8 Blood Pressure (mmHg): 150/76 Reference Range: 80 - 120 mg / dl Electronic Signature(s) Signed: 01/08/2022 5:15:44 PM By: Baruch Gouty RN, BSN Entered By: Baruch Gouty on 01/08/2022 12:42:10

## 2022-01-09 ENCOUNTER — Encounter: Payer: Self-pay | Admitting: Vascular Surgery

## 2022-01-09 ENCOUNTER — Ambulatory Visit (INDEPENDENT_AMBULATORY_CARE_PROVIDER_SITE_OTHER): Payer: Medicare Other | Admitting: Vascular Surgery

## 2022-01-09 VITALS — BP 118/68 | HR 90 | Temp 98.4°F | Resp 20 | Ht 74.0 in | Wt 150.0 lb

## 2022-01-09 DIAGNOSIS — I739 Peripheral vascular disease, unspecified: Secondary | ICD-10-CM

## 2022-01-10 ENCOUNTER — Other Ambulatory Visit: Payer: Self-pay

## 2022-01-10 DIAGNOSIS — I739 Peripheral vascular disease, unspecified: Secondary | ICD-10-CM

## 2022-01-11 ENCOUNTER — Ambulatory Visit (INDEPENDENT_AMBULATORY_CARE_PROVIDER_SITE_OTHER): Payer: Medicare Other | Admitting: Podiatry

## 2022-01-11 DIAGNOSIS — I739 Peripheral vascular disease, unspecified: Secondary | ICD-10-CM | POA: Diagnosis not present

## 2022-01-11 DIAGNOSIS — L97514 Non-pressure chronic ulcer of other part of right foot with necrosis of bone: Secondary | ICD-10-CM

## 2022-01-11 DIAGNOSIS — E1122 Type 2 diabetes mellitus with diabetic chronic kidney disease: Secondary | ICD-10-CM

## 2022-01-11 DIAGNOSIS — Z794 Long term (current) use of insulin: Secondary | ICD-10-CM

## 2022-01-11 DIAGNOSIS — M86171 Other acute osteomyelitis, right ankle and foot: Secondary | ICD-10-CM

## 2022-01-11 DIAGNOSIS — N1832 Chronic kidney disease, stage 3b: Secondary | ICD-10-CM

## 2022-01-11 NOTE — Progress Notes (Signed)
Subjective:  Patient ID: Justin [name], male    DOB: 1934-08-26,  MRN: [id number]  Chief Complaint  Patient presents with   Wound Check    Room 1  Pt reports no concerns , he states he has not seen any bleeding or drainage from the area     87 y.o. male presents with ulcer present at the distal right forefoot at site of prior right second toe amputation.  He had the amputation done by Dr. Jacqualyn [name] on November 26, 2021.  He had revascularization per Dr. Stanford [name] on 11/24/2021 with restoration of inline flow but disadvantaged pedal circulation per report.  He has since developed a wound at the site of the amputation.  Last seen by Dr. Jacqualyn [name] December 01, 2021.  He was referred to the wound care center and saw them on December 25, 2021.  He also saw infectious disease on December 21, 2018.  Repeat MRI was done of the right foot on September 3 that demonstrated marrow signal abnormality and cortical irregularity of the second and third metatarsal heads in the third toe proximal phalanx consistent with early osteomyelitis soft tissue swelling without evidence of abscess.  Infectious disease thought that his ongoing altered mental status may be due to ongoing dry gangrene process but also possibly progressive osteomyelitis of the right foot.  He does have a history of severe peripheral arterial disease.  Plan from infectious disease wise for long-term antibiotics and wound care.    Patient presents for follow-up of right second toe ulceration at site of prior second toe amputation.  He saw Dr. Stanford [name] earlier this week.  Per the note the patient is fully optimized from a vascular standpoint and nothing further can be done to improve his flow.  Dr. Stanford [name] did feel that the patient is at moderate risk of failure for transmetatarsal amputation related to his disadvantaged pedal circulation.  The patient has been continuing with wound care since last visit.  He goes to the wound care center once a week.  He denies  any new changes or nausea vomiting fever at this point time.   Past Medical History:  Diagnosis Date   Acute metabolic encephalopathy 04/11/7251   Arthritis    Diabetes mellitus    HTN (hypertension)    Hypercholesteremia    Iron deficiency    Pancreatitis, acute 06/02/2012   Diagnosed 05/2012    Personal history of colonic adenomas 04/15/2013   04/15/2013 2 diminutive polyps     Stroke ([place])    TIA (transient ischemic attack)     No Known Allergies  ROS: Negative except as per HPI above  Objective:  General: AAO x3, NAD  Dermatological: Ulceration at site of prior second toe amputation deep to second metatarsal head positive probe to bone fibrotic tissue present throughout the wound base.  No active drainage.  No bleeding noted from the wound wound margins upon debridement.  Minimal malodor.  No significant erythema crepitus fluctuance or concern for tracking infection.  Overall wound is stable from prior.      Vascular:  Dorsalis Pedis artery and Posterior Tibial artery pedal pulses nonpalpable on the right foot.    Neruologic: Grossly i diminished via light touch and protective threshold diminished to all sites bilateral.   Musculoskeletal: Status post right second toe amputation  Gait: Unassisted, Nonantalgic.      Radiographs:  X-ray deferred at this visit.  Assessment:   1. Ulcer of right foot, with necrosis of bone ([name])   2.  Osteomyelitis of foot, right, acute ([name])   3. PAD (peripheral artery disease) ([name])   4. Type 2 diabetes mellitus with stage 3b chronic kidney disease, with long-term current use of insulin ([name])       Plan:  Patient was evaluated and treated and all questions answered.  #Second toe amputation site dehiscence with ulceration to the level of bone with necrosis of the second metatarsal head and residual osteomyelitis of the right foot #Peripheral arterial disease right lower extremity -At this point time I recommend continuation of  current management with local wound care per the wound care center. -Patient may benefit for additional dressing changes throughout the week if he qualifies for home care.  If not I will continue with at least once weekly dressing changes per the wound care center. -At this time I would recommend we just proceed with long-term wound care attempting to keep the wound as stable as possible for as long as possible. -Currently I do not see the need for additional antibiotics.  Infectious disease had the patient on cefadroxil and doxycycline until October 12 so we will continue until then and he will then discontinue his antibiotics. -Continued follow-up will be with the wound care center.  He does not need to come to see me for additional visits unless there is concern for worsening infection that would require surgical intervention.  Return if symptoms worsen or fail to improve.          Justin [name], DPM [facility]

## 2022-01-16 ENCOUNTER — Encounter (HOSPITAL_BASED_OUTPATIENT_CLINIC_OR_DEPARTMENT_OTHER): Payer: Medicare Other | Admitting: General Surgery

## 2022-01-16 DIAGNOSIS — E11621 Type 2 diabetes mellitus with foot ulcer: Secondary | ICD-10-CM | POA: Diagnosis not present

## 2022-01-16 NOTE — Progress Notes (Signed)
ABDULLAHI, VALLONE ([id number]) [id number] Page 1 of 9 Visit Report for 01/16/2022 Arrival Information Details Patient Name: Date of Service: Justin [name], Justin [name] 01/16/2022 10:00 A M Medical Record Number: [id number] Patient Account Number: [id number] Date of Birth/Sex: Treating RN: [birth year] (87 y.o. Justin [name] Primary Care Justin [name]: Justin [name], Justin [name] Other Clinician: Referring Chanice Brenton: Treating Abimbola Aki/Extender: Fredirick Maudlin Justin [name], Bethany Weeks in Treatment: 4 Visit Information History Since Last Visit Added or deleted any medications: No Patient Arrived: Wheel Chair Any new allergies or adverse reactions: No Arrival Time: 10:09 Had a fall or experienced change in No Accompanied By: granddaughter activities of daily living that may affect Transfer Assistance: EasyPivot Patient Lift risk of falls: Patient Identification Verified: Yes Signs or symptoms of abuse/neglect since last visito No Secondary Verification Process Completed: Yes Hospitalized since last visit: No Patient Requires Transmission-Based Precautions: No Implantable device outside of the clinic excluding No Patient Has Alerts: Yes cellular tissue based products placed in the Justin [name] Patient Alerts: R ABI =.75, TBI= .34 since last visit: L ABI= .86, TBI= 1.02 Has Dressing in Place as Prescribed: Yes Pain Present Now: No Electronic Signature(s) Signed: 01/16/2022 4:02:37 PM By: Adline Peals Entered By: Adline Peals on 01/16/2022 10:14:48 -------------------------------------------------------------------------------- Encounter Discharge Information Details Patient Name: Date of Service: Justin Staggers [name]. 01/16/2022 10:00 A M Medical Record Number: [id number] Patient Account Number: [id number] Date of Birth/Sex: Treating RN: 1934/11/30 (87 y.o. Justin [name] Primary Care Vy Badley: [place] Other Clinician: Referring Nirvan Laban: Treating  Tarvaris Puglia/Extender: Fredirick Maudlin Justin [name], Bethany Weeks in Treatment: 4 Encounter Discharge Information Items Post Procedure Vitals Discharge Condition: Stable Temperature (F): 98.2 Ambulatory Status: Wheelchair Pulse (bpm): 108 Discharge Destination: [place] Respiratory Rate (breaths/min): 18 Telephoned: No Blood Pressure (mmHg): 160/73 Orders Sent: Yes Transportation: Private Auto Accompanied By: granddaughter Schedule Follow-up Appointment: Yes Clinical Summary of Care: Patient Declined Electronic Signature(s) Signed: 01/16/2022 4:02:37 PM By: Adline Peals Entered By: Adline Peals on 01/16/2022 13:16:56 Justin [name] ([id number]) [id number] Page 2 of 9 -------------------------------------------------------------------------------- Lower Extremity Assessment Details Patient Name: Date of Service: Justin [name], Justin [name] 01/16/2022 10:00 A M Medical Record Number: [id number] Patient Account Number: [id number] Date of Birth/Sex: Treating RN: 15-May-1934 (87 y.o. Justin [name] Primary Care Signe Tackitt: Justin [name], [place] Other Clinician: Referring Daniela Hernan: Treating Trilby Way/Extender: Fredirick Maudlin Justin [name], Bethany Weeks in Treatment: 4 Edema Assessment Assessed: [Left: No] [Right: No] Edema: [Left: N] [Right: o] Calf Left: Right: Point of Measurement: From Medial Instep 31 cm Ankle Left: Right: Point of Measurement: From Medial Instep 20.5 cm Electronic Signature(s) Signed: 01/16/2022 4:02:37 PM By: Adline Peals Entered By: Adline Peals on 01/16/2022 10:19:54 -------------------------------------------------------------------------------- Multi Wound Chart Details Patient Name: Date of Service: Justin Staggers [name]. 01/16/2022 10:00 A M Medical Record Number: [id number] Patient Account Number: [id number] Date of Birth/Sex: Treating RN: [birth year] (87 y.o. Justin [name] Primary Care  Melaney Tellefsen: Justin [name], Justin [name] Other Clinician: Referring Khiya Friese: Treating Keeleigh Terris/Extender: Fredirick Maudlin Justin [name], Bethany Weeks in Treatment: 4 Vital Signs Height(in): 72 Pulse(bpm): 108 Weight(lbs): 146 Blood Pressure(mmHg): 160/73 Body Mass Index(BMI): 19.8 Temperature(F): 98.2 Respiratory Rate(breaths/min): 18 [1:Photos:] [N/A:N/A] Right Amputation Site - [name] Right [name] N/A Wound Location: Surgical Injury Gradually Appeared N/A Wounding Event: Diabetic Wound/Ulcer of the Lower Diabetic Wound/Ulcer of the Lower N/A Primary Etiology: Extremity Extremity Open Surgical Wound Arterial Insufficiency Ulcer N/A Secondary Etiology: Anemia, Coronary Artery Disease, Anemia, Coronary Artery Disease, N/A Comorbid History: Hypertension, Peripheral Arterial Hypertension, Peripheral Arterial Disease, Type II Diabetes, Disease, Type  II Diabetes, Osteoarthritis, Anorexia/bulimia Osteoarthritis, [name], [name] ([id number]) [id number] Page 3 of 9 11/26/2021 11/26/2021 N/A Date Acquired: 4 4 N/A Weeks of Treatment: Open Open N/A Wound Status: No No N/A Wound Recurrence: 1.9x2.2x2.1 1x1x0.1 N/A Measurements L x W x D (cm) 3.283 0.785 N/A A (cm) : rea 6.894 0.079 N/A Volume (cm) : 0.50% -11.00% N/A % Reduction in A rea: 0.50% -11.30% N/A % Reduction in Volume: Grade 3 Grade 3 N/A Classification: Medium Small N/A Exudate A mount: Serosanguineous Serosanguineous N/A Exudate Type: red, brown red, brown N/A Exudate Color: Distinct, outline attached Flat and Intact N/A Wound Margin: Small (1-33%) None Present (0%) N/A Granulation A mount: Red N/A N/A Granulation Quality: Large (67-100%) Large (67-100%) N/A Necrotic A mount: Adherent Slough Eschar, Adherent Slough N/A Necrotic Tissue: Fat Layer (Subcutaneous Tissue): Yes Fat Layer (Subcutaneous Tissue): Yes N/A Exposed Structures: Bone: Yes Bone: Yes Fascia:  No Fascia: No Tendon: No Tendon: No Muscle: No Muscle: No Joint: No Joint: No Small (1-33%) Small (1-33%) N/A Epithelialization: No Abnormalities Noted No Abnormalities Noted N/A Periwound Skin Texture: No Abnormalities Noted No Abnormalities Noted N/A Periwound Skin Moisture: No Abnormalities Noted No Abnormalities Noted N/A Periwound Skin Color: No Abnormality No Abnormality N/A Temperature: Treatment Notes Electronic Signature(s) Signed: 01/16/2022 10:26:52 AM By: Fredirick Maudlin MD FACS Signed: 01/16/2022 4:02:37 PM By: Adline Peals Entered By: Fredirick Maudlin on 01/16/2022 10:26:52 -------------------------------------------------------------------------------- Multi-Disciplinary Care Plan Details Patient Name: Date of Service: Justin Staggers [name]. 01/16/2022 10:00 A M Medical Record Number: [id number] Patient Account Number: [id number] Date of Birth/Sex: Treating RN: Feb 22, 1935 (87 y.o. Justin [name] Primary Care Janes Colegrove: Justin [name], Justin [name] Other Clinician: Referring Rishit Burkhalter: Treating Mylena Sedberry/Extender: Fredirick Maudlin Justin [name], Jairo Ben in Treatment: [place] reviewed with physician Active Inactive Nutrition Nursing Diagnoses: Impaired glucose control: actual or potential Potential for alteratiion in Nutrition/Potential for imbalanced nutrition Goals: Patient/caregiver will maintain therapeutic glucose control Date Initiated: 12/19/2021 Target Resolution Date: 03/09/2022 Goal Status: Active Interventions: Assess HgA1c results as ordered upon admission and as needed Assess patient nutrition upon admission and as needed per policy Provide education on elevated blood sugars and impact on wound healing Treatment Activities: Patient referred to Primary Care Physician for further nutritional evaluation : 12/19/2021 CASSIAN, TORELLI ([id number]) [phone].pdf Page 4 of 9 Notes: Osteomyelitis Nursing  Diagnoses: Infection: osteomyelitis Knowledge deficit related to disease process and management Goals: Patient/caregiver will verbalize understanding of disease process and disease management Date Initiated: 12/19/2021 Target Resolution Date: 03/09/2022 Goal Status: Active Patient's osteomyelitis will resolve Date Initiated: 12/19/2021 Target Resolution Date: 03/09/2022 Goal Status: Active Interventions: Assess for signs and symptoms of osteomyelitis resolution every visit Provide education on osteomyelitis Treatment Activities: Consult for HBO : 12/19/2021 Surgical debridement : 12/19/2021 Systemic antibiotics : 12/19/2021 [name] ordered outside of clinic : 12/19/2021 est Notes: Wound/Skin Impairment Nursing Diagnoses: Impaired tissue integrity Knowledge deficit related to ulceration/compromised skin integrity Goals: Patient/caregiver will verbalize understanding of skin care regimen Date Initiated: 12/19/2021 Target Resolution Date: 03/09/2022 Goal Status: Active Ulcer/skin breakdown will have a volume reduction of 30% by week 4 Date Initiated: 12/19/2021 Target Resolution Date: 03/09/2022 Goal Status: Active Interventions: Assess patient/caregiver ability to obtain necessary supplies Assess patient/caregiver ability to perform ulcer/skin care regimen upon admission and as needed Assess ulceration(s) every visit Provide education on ulcer and skin care Treatment Activities: Skin care regimen initiated : 12/19/2021 Topical wound management initiated : 12/19/2021 Notes: Electronic Signature(s) Signed: 01/16/2022 4:02:37 PM By: Adline Peals Entered By: Adline Peals on 01/16/2022  10:31:06 -------------------------------------------------------------------------------- Pain Assessment Details Patient Name: Date of Service: Justin [name], Justin [name] 01/16/2022 10:00 A M Medical Record Number: [id number] Patient Account Number: [id number] Date of Birth/Sex: Treating RN: June 12, 1934  (87 y.o. Justin [name] Primary Care Arbell Wycoff: Justin [name], [place] Other Clinician: Referring Ravinder Hofland: Treating Talajah Slimp/Extender: Bernell List, Jairo Ben in Treatment: [address] HAWK, MONES ([id number]) [id number] Page 5 of 9 Location of Pain Severity and Description of Pain Patient Has Paino No Site Locations Rate the pain. Current Pain Level: 0 Pain Management and Medication Current Pain Management: Electronic Signature(s) Signed: 01/16/2022 4:02:37 PM By: Adline Peals Entered By: Adline Peals on 01/16/2022 10:15:12 -------------------------------------------------------------------------------- Patient/Caregiver Education Details Patient Name: Date of Service: Justin [name] 10/10/2023andnbsp10:00 A M Medical Record Number: [id number] Patient Account Number: [id number] Date of Birth/Gender: Treating RN: [birth year] (87 y.o. Justin [name] Primary Care Physician: Justin [name], [place] Other Clinician: Referring Physician: Treating Physician/Extender: Fredirick Maudlin Justin [name], Jairo Ben in Treatment: 4 Education Assessment Education Provided To: Patient Education Topics Provided Wound/Skin Impairment: Methods: Explain/Verbal Responses: Reinforcements needed, State content correctly Electronic Signature(s) Signed: 01/16/2022 4:02:37 PM By: Adline Peals Entered By: Adline Peals on 01/16/2022 10:31:26 Wound Assessment Details -------------------------------------------------------------------------------- Justin [name] ([id number]) [id number] Page 6 of 9 Patient Name: Date of Service: Justin [name], Justin [name] 01/16/2022 10:00 A M Medical Record Number: [id number] Patient Account Number: [id number] Date of Birth/Sex: Treating RN: 05-28-1934 (87 y.o. Justin [name] Primary Care Meko Masterson: Justin [name], Justin [name] Other Clinician: Referring Torell Minder: Treating  Reata Petrov/Extender: Fredirick Maudlin Justin [name], Bethany Weeks in Treatment: 4 Wound Status Wound Number: 1 Primary Diabetic Wound/Ulcer of the Lower Extremity Etiology: Wound Location: Right Amputation Site - Toe Secondary Open Surgical Wound Wounding Event: Surgical Injury Etiology: Date Acquired: 11/26/2021 Wound Open Weeks Of Treatment: 4 Status: Clustered Wound: No Comorbid Anemia, Coronary Artery Disease, Hypertension, Peripheral History: Arterial Disease, Type II Diabetes, Osteoarthritis, Anorexia/bulimia Photos Wound Measurements Length: (cm) 1.9 % Reduction in Area: 0.5% Width: (cm) 2.2 % Reduction in Volume: 0.5% Depth: (cm) 2.1 Epithelialization: Small (1-33%) Area: (cm) 3.283 Tunneling: No Volume: (cm) 6.894 Undermining: No Wound Description Classification: Grade 3 Foul Odor After Cleansing: No Wound Margin: Distinct, outline attached Slough/Fibrino Yes Exudate Amount: Medium Exudate Type: Serosanguineous Exudate Color: red, brown Wound Bed Granulation Amount: Small (1-33%) Exposed Structure Granulation Quality: Red Fascia Exposed: No Necrotic Amount: Large (67-100%) Fat Layer (Subcutaneous Tissue) Exposed: Yes Necrotic Quality: Adherent Slough Tendon Exposed: No Muscle Exposed: No Joint Exposed: No Bone Exposed: Yes Periwound Skin Texture Texture Color No Abnormalities Noted: Yes No Abnormalities Noted: Yes Moisture Temperature / Pain No Abnormalities Noted: Yes Temperature: No Abnormality Treatment Notes Wound #1 (Amputation Site - Toe) Wound Laterality: Right Cleanser Soap and Water Discharge Instruction: May shower and wash wound with dial antibacterial soap and water prior to dressing change. Wound Cleanser Discharge Instruction: Cleanse the wound with wound cleanser prior to applying a clean dressing using gauze sponges, not tissue or cotton balls. Peri-Wound Care Sween Lotion (Moisturizing lotion) Discharge Instruction: Apply moisturizing  lotion to both feet with dressing changes Justin [name], Justin [name] ([id number]) [id number] Page 7 of 9 Topical Primary Dressing Iodosorb Gel 10 (gm) Tube Discharge Instruction: Apply to bottom of wound bed, silver alginate to fill dead space Secondary Dressing Woven Gauze Sponge, Non-Sterile 4x4 in Discharge Instruction: Moisten with saline and apply over primary dressing as directed. Secured With [employer], 4.5x3.1 (in/yd) Discharge Instruction: Secure with Kerlix as directed. 28M Medipore H Soft Cloth Surgical [name] ape, 4  x 10 (in/yd) Discharge Instruction: Secure with tape as directed. Compression Wrap Compression Stockings Add-Ons Electronic Signature(s) Signed: 01/16/2022 4:02:37 PM By: Adline Peals Entered By: Adline Peals on 01/16/2022 10:24:23 -------------------------------------------------------------------------------- Wound Assessment Details Patient Name: Date of Service: Justin Staggers [name]. 01/16/2022 10:00 A M Medical Record Number: [id number] Patient Account Number: [id number] Date of Birth/Sex: Treating RN: November 26, 1934 (87 y.o. Justin [name] Primary Care Oveda Dadamo: Justin [name], Justin [name] Other Clinician: Referring Tatym Schermer: Treating Chatara Lucente/Extender: Fredirick Maudlin Justin [name], Bethany Weeks in Treatment: 4 Wound Status Wound Number: 2 Primary Diabetic Wound/Ulcer of the Lower Extremity Etiology: Wound Location: Right [name] Secondary Arterial Insufficiency Ulcer Wounding Event: Gradually Appeared Etiology: Date Acquired: 11/26/2021 Wound Open Weeks Of Treatment: 4 Status: Clustered Wound: No Comorbid Anemia, Coronary Artery Disease, Hypertension, Peripheral History: Arterial Disease, Type II Diabetes, Osteoarthritis, Anorexia/bulimia Photos Wound Measurements Length: (cm) 1 Width: (cm) 1 Depth: (cm) 0.1 Area: (cm) 0.785 Volume: (cm) 0.079 Justin [name], Justin [name] ([id number]) Wound Description Classification:  Grade 3 Wound Margin: Flat and Intact Exudate Amount: Small Exudate Type: Serosanguineous Exudate Color: red, brown Foul Odor After Cleansing: No Slough/Fibrino Yes % Reduction in Area: -11% % Reduction in Volume: -11.3% Epithelialization: Small (1-33%) Tunneling: No Undermining: No [id number] Page 8 of 9 Wound Bed Granulation Amount: None Present (0%) Exposed Structure Necrotic Amount: Large (67-100%) Fascia Exposed: No Necrotic Quality: Eschar, Adherent Slough Fat Layer (Subcutaneous Tissue) Exposed: Yes Tendon Exposed: No Muscle Exposed: No Joint Exposed: No Bone Exposed: Yes Periwound Skin Texture Texture Color No Abnormalities Noted: Yes No Abnormalities Noted: Yes Moisture Temperature / Pain No Abnormalities Noted: Yes Temperature: No Abnormality Treatment Notes Wound #2 (Toe Third) Wound Laterality: Right Cleanser Soap and Water Discharge Instruction: May shower and wash wound with dial antibacterial soap and water prior to dressing change. Wound Cleanser Discharge Instruction: Cleanse the wound with wound cleanser prior to applying a clean dressing using gauze sponges, not tissue or cotton balls. Peri-Wound Care Sween Lotion (Moisturizing lotion) Discharge Instruction: Apply moisturizing lotion to both feet with dressing changes Topical Primary Dressing Iodosorb Gel 10 (gm) Tube Discharge Instruction: Apply to wound bed as instructed Secondary Dressing Woven Gauze Sponge, Non-Sterile 4x4 in Discharge Instruction: Moisten with saline and apply over primary dressing as directed. Secured With [employer], 4.5x3.1 (in/yd) Discharge Instruction: Secure with Kerlix as directed. 67M Medipore H Soft Cloth Surgical [name] ape, 4 x 10 (in/yd) Discharge Instruction: Secure with tape as directed. Compression Wrap Compression Stockings Add-Ons Electronic Signature(s) Signed: 01/16/2022 4:02:37 PM By: Adline Peals Entered By:  Adline Peals on 01/16/2022 10:24:48 -------------------------------------------------------------------------------- Vitals Details Patient Name: Date of Service: Justin Grant Fontana [name]. 01/16/2022 10:00 A Loleta Books ([id number]) [id number] Page 9 of 9 Medical Record Number: [id number] Patient Account Number: [id number] Date of Birth/Sex: Treating RN: [birth year] (87 y.o. Justin [name] Primary Care Erika Slaby: [place] Other Clinician: Referring Trevious Rampey: Treating Khristen Cheyney/Extender: Fredirick Maudlin Justin [name], Bethany Weeks in Treatment: 4 Vital Signs Time Taken: 10:14 Temperature (F): 98.2 Height (in): 72 Pulse (bpm): 108 Weight (lbs): 146 Respiratory Rate (breaths/min): 18 Body Mass Index (BMI): 19.8 Blood Pressure (mmHg): 160/73 Reference Range: 80 - 120 mg / dl Electronic Signature(s) Signed: 01/16/2022 4:02:37 PM By: Adline Peals Entered By: Adline Peals on 01/16/2022 10:15:06

## 2022-01-16 NOTE — Progress Notes (Addendum)
JT, BRABEC ([id number]) [id number] Page 1 of 11 Visit Report for 01/16/2022 Chief Complaint Document Details Patient Name: Date of Service: Justin [name], Justin [name] 01/16/2022 10:00 A M Medical Record Number: [id number] Patient Account Number: [id number] Date of Birth/Sex: Treating RN: 02/18/1935 (86 y.o. Janyth Contes Primary Care Provider: [facility] Other Clinician: Referring Provider: Treating Provider/Extender: [facility], Bethany Weeks in Treatment: 4 Information Obtained from: Patient Chief Complaint Patients presents for treatment of open diabetic ulcers and dehiscence of surgical site Electronic Signature(s) Signed: 01/16/2022 10:37:19 AM By: Fredirick Maudlin MD FACS Entered By: Fredirick Maudlin on 01/16/2022 10:37:19 -------------------------------------------------------------------------------- Debridement Details Patient Name: Date of Service: Justin [name]. 01/16/2022 10:00 A M Medical Record Number: [id number] Patient Account Number: [id number] Date of Birth/Sex: Treating RN: Jan 15, 1935 (87 y.o. Janyth Contes Primary Care Provider: [place] Other Clinician: Referring Provider: Treating Provider/Extender: [facility], Bethany Weeks in Treatment: 4 Debridement Performed for Assessment: Wound #1 Right Amputation Site - Toe Performed By: Physician Fredirick Maudlin, MD Debridement Type: Debridement Severity of Tissue Pre Debridement: Fat layer exposed Level of Consciousness (Pre-procedure): Awake and Alert Pre-procedure Verification/Time Out Yes - 10:31 Taken: Start Time: 10:31 Pain Control: Lidocaine 4% [name] opical Solution [name] Area Debrided (L x W): otal 1.9 (cm) x 2.2 (cm) = 4.18 (cm) Tissue and other material debrided: Non-Viable, Slough, Subcutaneous, Slough Level: Skin/Subcutaneous Tissue Debridement Description: Excisional Instrument: Curette Bleeding: Minimum Hemostasis  Achieved: Pressure Procedural Pain: 0 Post Procedural Pain: 0 Response to Treatment: Procedure was tolerated well Level of Consciousness (Post- Awake and Alert procedure): Post Debridement Measurements of Total Wound Length: (cm) 1.9 Width: (cm) 2.2 Depth: (cm) 2.1 Volume: (cm) 6.894 Character of Wound/Ulcer Post Debridement: Improved Severity of Tissue Post Debridement: Fat layer exposed Justin [name], Justin [name] ([id number]) [id number] Page 2 of 11 Post Procedure Diagnosis Same as Pre-procedure Notes scribed for Dr. Celine Ahr by Adline Peals, RN Electronic Signature(s) Signed: 01/16/2022 11:22:15 AM By: Fredirick Maudlin MD FACS Signed: 01/16/2022 4:02:37 PM By: Adline Peals Entered By: Adline Peals on 01/16/2022 10:32:48 -------------------------------------------------------------------------------- Debridement Details Patient Name: Date of Service: Justin [name]. 01/16/2022 10:00 A M Medical Record Number: [id number] Patient Account Number: [id number] Date of Birth/Sex: Treating RN: 03-Apr-1935 (87 y.o. Janyth Contes Primary Care Provider: [place] Other Clinician: Referring Provider: Treating Provider/Extender: [facility], Bethany Weeks in Treatment: 4 Debridement Performed for Assessment: Wound #2 Right [name] Performed By: Physician Fredirick Maudlin, MD Debridement Type: Debridement Severity of Tissue Pre Debridement: Fat layer exposed Level of Consciousness (Pre-procedure): Awake and Alert Pre-procedure Verification/Time Out Yes - 10:31 Taken: Start Time: 10:31 Pain Control: Lidocaine 4% [name] opical Solution [name] Area Debrided (L x W): otal 1 (cm) x 1 (cm) = 1 (cm) Tissue and other material debrided: Non-Viable, Slough, Subcutaneous, Slough Level: Skin/Subcutaneous Tissue Debridement Description: Excisional Instrument: Curette Bleeding: Minimum Hemostasis Achieved: Pressure Procedural Pain:  0 Post Procedural Pain: 0 Response to Treatment: Procedure was tolerated well Level of Consciousness (Post- Awake and Alert procedure): Post Debridement Measurements of Total Wound Length: (cm) 1 Width: (cm) 1 Depth: (cm) 0.1 Volume: (cm) 0.079 Character of Wound/Ulcer Post Debridement: Improved Severity of Tissue Post Debridement: Fat layer exposed Post Procedure Diagnosis Same as Pre-procedure Notes scribed for Dr. Celine Ahr by Adline Peals, RN Electronic Signature(s) Signed: 01/16/2022 11:22:15 AM By: Fredirick Maudlin MD FACS Signed: 01/16/2022 4:02:37 PM By: Adline Peals Entered By: Adline Peals on 01/16/2022 10:33:59 Justin [name] ([id number]) [id number] Page 3 of  11 -------------------------------------------------------------------------------- HPI Details Patient Name: Date of Service: Justin [name], Justin [name] 01/16/2022 10:00 A M Medical Record Number: [id number] Patient Account Number: [id number] Date of Birth/Sex: Treating RN: 1934-04-30 (87 y.o. Janyth Contes Primary Care Provider: [facility] St[facility] Other Clinician: Referring Provider: Treating Provider/Extender: [facility], Bethany Weeks in Treatment: 4 History of Present Illness HPI Description: ADMISSION 12/19/2021 This is an 87 year old man man with multiple medical problems including type 2 diabetes mellitus, chronic pancreatitis, coronary artery disease, peripheral vascular disease, stroke and TIA. He was admitted to the hospital on November 15, 2021 with a diabetic foot infection. An MRI was performed on admission that was questionable for early osteomyelitis. He was seen in consultation by both podiatry and vascular surgery. Due to limb threatening ischemia, he underwent a lower extremity angiogram with multiple angioplasties on August 18. He then had amputation of his right second toe on August 20. There was frank pus present which was cultured. He was  placed on a course of oral antibiotics. He subsequently was discharged but returned due to altered mental status on September 2. Repeat MRI was performed that was again concerning for osteomyelitis. Infectious disease was consulted. A 4-week course of oral doxycycline and cefadroxil was prescribed. Podiatry referred him to the wound care center for further evaluation and management. On exam, there is a recent right second toe amputation site. The Prolene sutures are gaping and the wound has a fairly deep cavity; it does probe to bone. There is no purulent drainage or odor, but there is quite a bit of fat necrosis present. On the lateral aspect of his third toe, there is an ulcer that has a leathery eschar overlying it. Underneath, bone and tendon are exposed. No purulent drainage or malodor from this wound, either. 12/25/2021: There is still extensive slough and fat necrosis in the right second toe amputation site. The lateral third toe wound has also accumulated quite a bit of slough. Today he did not have the prescribed dressing in situ, just a bit of gauze over the wound sites. 01/01/2022: Patient's facility paperwork today indicates that they are requesting a consultation for possible hyperbaric oxygen therapy. Unfortunately there are no other notes available to me from whomever made this request. There is still substantial slough and nonviable subcutaneous tissue present in both of the wounds. There is bone exposed in both sites. He continues on oral antibiotics per infectious disease. 01/08/2022: Both wounds are a bit cleaner today. It bone is still frankly exposed at both sites. The second toe amputation site is deeper, but this is likely secondary to the aggressive debridements I have been performing. We are in the process of evaluating him for hyperbaric oxygen therapy. He has been optimized from a vascular standpoint. His diabetes is suboptimally controlled, with his most recent hemoglobin A1c  being 8.9% on December 10, 2021. He continues on oral antibiotics per infectious disease. 01/16/2022: Due to lack of daily transportation and patient's refusal of chest x-ray and EKG, the decision was made not to further pursue hyperbaric oxygen therapy. Both wounds are about the same size today, there is a little less slough in the amputation site. Bone remains exposed. Electronic Signature(s) Signed: 01/16/2022 10:38:38 AM By: Fredirick Maudlin MD FACS Entered By: Fredirick Maudlin on 01/16/2022 10:38:38 -------------------------------------------------------------------------------- Physical Exam Details Patient Name: Date of Service: Justin [name]. 01/16/2022 10:00 A M Medical Record Number: [id number] Patient Account Number: [id number] Date of Birth/Sex: Treating RN: 1934/08/06 (87 y.o. Janyth Contes Primary  Care Provider: [place] Other Clinician: Referring Provider: Treating Provider/Extender: [facility], Bethany Weeks in Treatment: 4 Constitutional Hypertensive, asymptomatic. Slightly tachycardic, asymptomatic. . . No acute distress.Marland Kitchen Respiratory Normal work of breathing on room air.. Notes 01/16/2022: Both wounds are about the same size today, there is a little less slough in the amputation site. Bone remains exposed. Electronic Signature(s) CHINMAY, SQUIER [name] ([id number]) [id number] Page 4 of 11 Signed: 01/16/2022 10:39:41 AM By: Fredirick Maudlin MD FACS Entered By: Fredirick Maudlin on 01/16/2022 10:39:41 -------------------------------------------------------------------------------- Physician Orders Details Patient Name: Date of Service: Justin [name]. 01/16/2022 10:00 A M Medical Record Number: [id number] Patient Account Number: [id number] Date of Birth/Sex: Treating RN: [birth year] (87 y.o. Janyth Contes Primary Care Provider: [facility] Other Clinician: Referring Provider: Treating  Provider/Extender: [facility], Bethany Weeks in Treatment: 4 Verbal / Phone Orders: No Diagnosis Coding ICD-10 Coding Code Description E11.42 Type 2 diabetes mellitus with diabetic polyneuropathy N18.32 Chronic kidney disease, stage 3b I73.9 Peripheral vascular disease, unspecified I25.10 Atherosclerotic heart disease of native coronary artery without angina pectoris I10 Essential (primary) hypertension K86.1 Other chronic pancreatitis E11.621 Type 2 diabetes mellitus with foot ulcer T81.31XS Disruption of external operation (surgical) wound, not elsewhere classified, sequela 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 - Dr. Celine Ahr RM 1 with Vaughan Basta Return A Anesthetic Wound #1 Right Amputation Site - Toe (In clinic) Topical Lidocaine 4% applied to wound bed Wound #2 Right [name] (In clinic) Topical Lidocaine 4% applied to wound bed Bathing/ Shower/ Hygiene Other Bathing/Shower/Hygiene Orders/Instructions: - may wash feet with soap and water with dressing changes Off-Loading Heel suspension boot to: - podus boots bilateral feet Non Wound Condition Cleanse area with: - clean left ear with hydrogen peroxide and water Wound Treatment Wound #1 - Amputation Site - Toe Wound Laterality: Right Cleanser: Soap and Water 1 x Per Day/30 Days Discharge Instructions: May shower and wash wound with dial antibacterial soap and water prior to dressing change. Cleanser: Wound Cleanser 1 x Per Day/30 Days Discharge Instructions: Cleanse the wound with wound cleanser prior to applying a clean dressing using gauze sponges, not tissue or cotton balls. Peri-Wound Care: Sween Lotion (Moisturizing lotion) 1 x Per Day/30 Days Discharge  Instructions: Apply moisturizing lotion to both feet with dressing changes Prim Dressing: Iodosorb Gel 10 (gm) Tube 1 x Per Day/30 Days ary Discharge Instructions: Apply to bottom of wound bed, silver alginate to fill dead space Secondary Dressing: Woven Gauze Sponge, Non-Sterile 4x4 in 1 x Per Day/30 Days Discharge Instructions: Moisten with saline and apply over primary dressing as directed. Justin [name], Justin [name] ([id number]) [id number] Page 5 of 11 Secured With: [employer], 4.5x3.1 (in/yd) 1 x Per Day/30 Days Discharge Instructions: Secure with Kerlix as directed. Secured With: 28M Medipore H Soft Cloth Surgical [name] ape, 4 x 10 (in/yd) 1 x Per Day/30 Days Discharge Instructions: Secure with tape as directed. Wound #2 - [name] Wound Laterality: Right Cleanser: Soap and Water 1 x Per Day/30 Days Discharge Instructions: May shower and wash wound with dial antibacterial soap and water prior to dressing change. Cleanser: Wound Cleanser 1 x Per Day/30 Days Discharge Instructions: Cleanse the wound with wound cleanser prior to applying a clean dressing using gauze sponges, not tissue or  cotton balls. Peri-Wound Care: Sween Lotion (Moisturizing lotion) 1 x Per Day/30 Days Discharge Instructions: Apply moisturizing lotion to both feet with dressing changes Prim Dressing: Iodosorb Gel 10 (gm) Tube 1 x Per Day/30 Days ary Discharge Instructions: Apply to wound bed as instructed Secondary Dressing: Woven Gauze Sponge, Non-Sterile 4x4 in 1 x Per Day/30 Days Discharge Instructions: Moisten with saline and apply over primary dressing as directed. Secured With: [employer], 4.5x3.1 (in/yd) 1 x Per Day/30 Days Discharge Instructions: Secure with Kerlix as directed. Secured With: 38M Medipore H Soft Cloth Surgical [name] ape, 4 x 10 (in/yd) 1 x Per Day/30 Days Discharge Instructions: Secure with tape as directed. Patient Medications llergies: No Known  Allergies A Notifications Medication Indication Start End 01/16/2022 lidocaine DOSE topical 4 % cream - cream topical Electronic Signature(s) Signed: 01/16/2022 11:22:15 AM By: Fredirick Maudlin MD FACS Entered By: Fredirick Maudlin on 01/16/2022 10:39:58 -------------------------------------------------------------------------------- Problem List Details Patient Name: Date of Service: Justin [name]. 01/16/2022 10:00 A M Medical Record Number: [id number] Patient Account Number: [id number] Date of Birth/Sex: Treating RN: 04/15/1934 (87 y.o. Janyth Contes Primary Care Provider: [place] Other Clinician: Referring Provider: Treating Provider/Extender: Bernell List, Bethany Weeks in Treatment: 4 Active Problems ICD-10 Encounter Code Description Active Date MDM Diagnosis E11.42 Type 2 diabetes mellitus with diabetic polyneuropathy 12/19/2021 No Yes N18.32 Chronic kidney disease, stage 3b 12/19/2021 No Yes I73.9 Peripheral vascular disease, unspecified 12/19/2021 No Yes JEMUEL, LAURSEN ([id number]) [id number] Page 6 of 11 I25.10 Atherosclerotic heart disease of native coronary artery without angina pectoris 12/19/2021 No Yes I10 Essential (primary) hypertension 12/19/2021 No Yes K86.1 Other chronic pancreatitis 12/19/2021 No Yes E11.621 Type 2 diabetes mellitus with foot ulcer 12/19/2021 No Yes T81.31XS Disruption of external operation (surgical) wound, not elsewhere classified, 12/19/2021 No Yes sequela L97.514 Non-pressure chronic ulcer of other part of right foot with necrosis of bone 12/19/2021 No Yes L97.516 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 01/16/2022 10:00 A M Medical Record Number: [id number] Patient Account Number: [id number] Date of Birth/Sex: Treating RN: 1934/06/09 (87 y.o. Janyth Contes Primary Care Provider: [facility] Other Clinician: Referring Provider: Treating Provider/Extender: [facility], Bethany Weeks in Treatment: 4 Subjective Chief Complaint Information obtained from Patient Patients presents for treatment of open diabetic ulcers and dehiscence of surgical site History of Present Illness (HPI) ADMISSION 12/19/2021 This is an 87 year old man with multiple medical problems including type 2 diabetes mellitus, chronic pancreatitis, coronary artery disease, peripheral vascular disease, stroke and TIA. He was admitted to the hospital on November 15, 2021 with a diabetic foot infection. An MRI was performed on admission that was questionable for early osteomyelitis. He was seen in consultation by both podiatry and vascular surgery. Due to limb threatening ischemia, he underwent a lower extremity angiogram with multiple angioplasties on August 18. He then had amputation of his right second toe on August 20. There was frank pus present which was cultured. He was placed on a course of oral antibiotics. He subsequently was discharged but returned due to altered mental status on September 2. Justin [name], Justin [name] ([id number]) [id number] Page 7 of 11 Repeat MRI  was performed that was again concerning for osteomyelitis. Infectious disease was consulted. A 4-week course of oral doxycycline and cefadroxil was prescribed. Podiatry referred him to the wound care center for further evaluation and management. On exam, there is a recent right second toe amputation site. The Prolene sutures  are gaping and the wound has a fairly deep cavity; it does probe to bone. There is no purulent drainage or odor, but there is quite a bit of fat necrosis present. On the lateral aspect of his third toe, there is an ulcer that has a leathery eschar overlying it. Underneath, bone and tendon are exposed. No purulent drainage or malodor from this wound, either. 12/25/2021: There is still extensive slough and fat necrosis in the right second toe amputation site. The lateral third toe wound has also accumulated quite a bit of slough. Today he did not have the prescribed dressing in situ, just a bit of gauze over the wound sites. 01/01/2022: Patient's facility paperwork today indicates that they are requesting a consultation for possible hyperbaric oxygen therapy. Unfortunately there are no other notes available to me from whomever made this request. There is still substantial slough and nonviable subcutaneous tissue present in both of the wounds. There is bone exposed in both sites. He continues on oral antibiotics per infectious disease. 01/08/2022: Both wounds are a bit cleaner today. It bone is still frankly exposed at both sites. The second toe amputation site is deeper, but this is likely secondary to the aggressive debridements I have been performing. We are in the process of evaluating him for hyperbaric oxygen therapy. He has been optimized from a vascular standpoint. His diabetes is suboptimally controlled, with his most recent hemoglobin A1c being 8.9% on December 10, 2021. He continues on oral antibiotics per infectious disease. 01/16/2022: Due to lack of daily transportation and patient's refusal of chest x-ray and EKG, the decision was made not to further pursue hyperbaric oxygen therapy. Both wounds are about the same size today, there is a little less slough in the amputation site. Bone remains exposed. Patient History Information obtained from Patient, Chart. Family History Diabetes, Heart  Disease - Mother, Hypertension - Mother,Siblings, No family history of Cancer, Hereditary Spherocytosis, Kidney Disease, Lung Disease, Seizures, Stroke, Thyroid Problems, Tuberculosis. Social History Former smoker, Marital Status - Married, Alcohol Use - Never, Drug Use - No History, Caffeine Use - Daily - coffee. Medical History Eyes Denies history of Cataracts, Glaucoma, Optic Neuritis Hematologic/Lymphatic Patient has history of Anemia Cardiovascular Patient has history of Coronary Artery Disease, Hypertension, Peripheral Arterial Disease Endocrine Patient has history of Type II Diabetes Denies history of Type I Diabetes Genitourinary Denies history of End Stage Renal Disease Integumentary (Skin) Denies history of History of Burn Musculoskeletal Patient has history of Osteoarthritis Neurologic Denies history of Neuropathy Oncologic Denies history of Received Chemotherapy, Received Radiation Psychiatric Patient has history of Anorexia/bulimia - poor apetite Denies history of Confinement Anxiety Hospitalization/Surgery History - right 2nd toe amputation 11/26/21. - agram. - peripheral balloon angioplasty 11/24/21. - temporal artery biopsy. - back surgery. - neck surgery. - cholecystectomy. Medical A Surgical History Notes nd Gastrointestinal chronic pancreatitis Genitourinary CKD st 3, testosterone deficiency Neurologic CVA Objective Constitutional Hypertensive, asymptomatic. Slightly tachycardic, asymptomatic. No acute distress.. Vitals Time Taken: 10:14 AM, Height: 72 in, Weight: 146 lbs, BMI: 19.8, Temperature: 98.2 F, Pulse: 108 bpm, Respiratory Rate: 18 breaths/min, Blood Pressure: 160/73 mmHg. Respiratory Normal work of breathing on room air.Marland Kitchen Justin [name], Justin [name] ([id number]) [id number] Page 8 of 11  General Notes: 01/16/2022: Both wounds are about the same size today, there is a little less slough in the amputation site. Bone remains  exposed. Integumentary (Hair, Skin) Wound #1 status is Open. Original cause of wound was Surgical Injury. The date acquired was: 11/26/2021. The wound has been in treatment 4 weeks. The wound is located on the Right Amputation Site - [name] The wound measures 1.9cm length x 2.2cm width x 2.1cm depth; 3.283cm^2 area and 6.894cm^3 volume. oe. There is bone and Fat Layer (Subcutaneous Tissue) exposed. There is no tunneling or undermining noted. There is a medium amount of serosanguineous drainage noted. The wound margin is distinct with the outline attached to the wound base. There is small (1-33%) red granulation within the wound bed. There is a large (67-100%) amount of necrotic tissue within the wound bed including Adherent Slough. The periwound skin appearance had no abnormalities noted for texture. The periwound skin appearance had no abnormalities noted for moisture. The periwound skin appearance had no abnormalities noted for color. Periwound temperature was noted as No Abnormality. Wound #2 status is Open. Original cause of wound was Gradually Appeared. The date acquired was: 11/26/2021. The wound has been in treatment 4 weeks. The wound is located on the Right [name] Third. The wound measures 1cm length x 1cm width x 0.1cm depth; 0.785cm^2 area and 0.079cm^3 volume. There is bone oe and Fat Layer (Subcutaneous Tissue) exposed. There is no tunneling or undermining noted. There is a small amount of serosanguineous drainage noted. The wound margin is flat and intact. There is no granulation within the wound bed. There is a large (67-100%) amount of necrotic tissue within the wound bed including Eschar and Adherent Slough. The periwound skin appearance had no abnormalities noted for texture. The periwound skin appearance had no abnormalities noted for moisture. The periwound skin appearance had no abnormalities noted for color. Periwound temperature was noted as No Abnormality. 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 There was a Excisional Skin/Subcutaneous Tissue Debridement with a total area of 4.18 sq cm performed by Fredirick Maudlin, MD. With the following instrument(s): Curette to remove Non-Viable tissue/material. Material removed includes Subcutaneous Tissue and Slough and after achieving pain control using Lidocaine 4% Topical Solution. No specimens were taken. A time out was conducted at 10:31, prior to the start of the procedure. A Minimum amount of bleeding was controlled with Pressure. The procedure was tolerated well with a pain level of 0 throughout and a pain level of 0 following the procedure. Post Debridement Measurements: 1.9cm length x 2.2cm width x 2.1cm depth; 6.894cm^3 volume. Character of Wound/Ulcer Post Debridement is improved. Severity of Tissue Post Debridement is: Fat layer exposed. Post procedure Diagnosis Wound #1: Same as Pre-Procedure General Notes: scribed for Dr. Celine Ahr by Adline Peals, RN. Wound #2 Pre-procedure diagnosis of Wound #2  is a Diabetic Wound/Ulcer of the Lower Extremity located on the Right [name] Third .Severity of Tissue  Pre Debridement is: oe Fat layer exposed. There was a Excisional Skin/Subcutaneous Tissue Debridement with a total area of 1 sq cm performed by Fredirick Maudlin, MD. With the following instrument(s): Curette to remove Non-Viable tissue/material. Material removed includes Subcutaneous Tissue and Slough and after achieving pain control using Lidocaine 4% [name] opical Solution. No specimens were taken. A time out was conducted at 10:31, prior to the start of the procedure. A Minimum amount of bleeding was controlled with Pressure. The procedure was tolerated well with a pain level of 0 throughout and a pain level of 0 following the procedure. Post Debridement Measurements: 1cm length x 1cm width x 0.1cm depth; 0.079cm^3 volume. Character of Wound/Ulcer Post Debridement is improved. Severity of Tissue Post Debridement is: Fat layer exposed. Post procedure Diagnosis Wound #2: Same as Pre-Procedure General Notes: scribed for Dr. Celine Ahr by Adline Peals, RN. Plan Follow-up Appointments: Return Appointment in 1 week. - Dr. Celine Ahr RM 1 with Vaughan Basta Anesthetic: Wound #1 Right Amputation Site - [name]: (In clinic) Topical Lidocaine 4% applied to wound bed Wound #2 Right [name] Third: oe (In clinic) Topical Lidocaine 4% applied to wound bed Bathing/ Shower/ Hygiene: Other Bathing/Shower/Hygiene Orders/Instructions: - may wash feet with soap and water with dressing changes Off-Loading: Heel suspension boot to: - podus boots bilateral feet Non Wound Condition: Cleanse area with: - clean left ear with hydrogen peroxide and water The following medication(s) was prescribed: lidocaine topical 4 % cream cream topical was prescribed at facility [name], Justin [name] ([id number]) [id number] Page 9 of 11 WOUND #1: - Amputation Site - Toe Wound Laterality: Right Cleanser: Soap and Water 1 x Per Day/30 Days Discharge Instructions: May shower and wash wound with dial antibacterial soap and water  prior to dressing change. Cleanser: Wound Cleanser 1 x Per Day/30 Days Discharge Instructions: Cleanse the wound with wound cleanser prior to applying a clean dressing using gauze sponges, not tissue or cotton balls. Peri-Wound Care: Sween Lotion (Moisturizing lotion) 1 x Per Day/30 Days Discharge Instructions: Apply moisturizing lotion to both feet with dressing changes Prim Dressing: Iodosorb Gel 10 (gm) Tube 1 x Per Day/30 Days ary Discharge Instructions: Apply to bottom of wound bed, silver alginate to fill dead space Secondary Dressing: Woven Gauze Sponge, Non-Sterile 4x4 in 1 x Per Day/30 Days Discharge Instructions: Moisten with saline and apply over primary dressing as directed. Secured With: [employer], 4.5x3.1 (in/yd) 1 x Per Day/30 Days Discharge Instructions: Secure with Kerlix as directed. Secured With: 33M Medipore H Soft Cloth Surgical [name] ape, 4 x 10 (in/yd) 1 x Per Day/30 Days Discharge Instructions: Secure with tape as directed. WOUND #2: - [name] Third Wound Laterality: Right oe Cleanser: Soap and Water 1 x Per Day/30 Days Discharge Instructions: May shower and wash wound with dial antibacterial soap and water prior to dressing change. Cleanser: Wound Cleanser 1 x Per Day/30 Days Discharge Instructions: Cleanse the wound with wound cleanser prior to applying a clean dressing using gauze sponges, not tissue or cotton balls. Peri-Wound Care: Sween Lotion (Moisturizing lotion) 1 x Per Day/30 Days Discharge Instructions: Apply moisturizing lotion to both feet with dressing changes Prim Dressing: Iodosorb Gel 10 (gm) Tube 1 x Per Day/30 Days ary Discharge Instructions: Apply to wound bed as instructed Secondary Dressing: Woven Gauze Sponge, Non-Sterile 4x4 in 1 x Per Day/30 Days Discharge Instructions: Moisten with saline and apply over primary dressing as  directed. Secured With: [employer], 4.5x3.1 (in/yd) 1 x Per Day/30 Days Discharge Instructions: Secure with  Kerlix as directed. Secured With: 63M Medipore H Soft Cloth Surgical [name] ape, 4 x 10 (in/yd) 1 x Per Day/30 Days Discharge Instructions: Secure with tape as directed. 01/16/2022: Both wounds are about the same size today, there is a little less slough in the amputation site. Bone remains exposed. I used a curette to debride slough and nonviable subcutaneous tissue from both of the wound sites. I am concerned that the joint space is more open on the third toe wound and that toe may not survive. Continue Iodosorb to both sites. Follow-up in 1 week. Electronic Signature(s) Signed: 01/16/2022 10:40:38 AM By: Fredirick Maudlin MD FACS Entered By: Fredirick Maudlin on 01/16/2022 10:40:38 -------------------------------------------------------------------------------- HxROS Details Patient Name: Date of Service: Justin [name]. 01/16/2022 10:00 A M Medical Record Number: [id number] Patient Account Number: [id number] Date of Birth/Sex: Treating RN: Feb 20, 1935 (87 y.o. Janyth Contes Primary Care Provider: [place] Other Clinician: Referring Provider: Treating Provider/Extender: Fredirick Maudlin [facility], [name] in Treatment: 4 Information Obtained From Patient Chart Eyes Medical History: Negative for: Cataracts; Glaucoma; Optic Neuritis Hematologic/Lymphatic Medical History: Positive for: Anemia Cardiovascular Medical History: Positive for: Coronary Artery Disease; Hypertension; Peripheral Arterial Disease Gastrointestinal Justin [name], Justin [name] ([id number]) [id number] Page 10 of 11 Medical History: Past Medical History Notes: chronic pancreatitis Endocrine Medical History: Positive for: Type II Diabetes Negative for: Type I Diabetes Treated with: Insulin Blood sugar tested every day: Yes Tested : 2 times per day Genitourinary Medical History: Negative for: End Stage Renal Disease Past Medical History Notes: CKD st 3, testosterone  deficiency Integumentary (Skin) Medical History: Negative for: History of Burn Musculoskeletal Medical History: Positive for: Osteoarthritis Neurologic Medical History: Negative for: Neuropathy Past Medical History Notes: CVA Oncologic Medical History: Negative for: Received Chemotherapy; Received Radiation Psychiatric Medical History: Positive for: Anorexia/bulimia - poor apetite Negative for: Confinement Anxiety Immunizations Pneumococcal Vaccine: Received Pneumococcal Vaccination: Yes Received Pneumococcal Vaccination On or After 60th Birthday: Yes Implantable Devices No devices added Hospitalization / Surgery History Type of Hospitalization/Surgery right 2nd toe amputation 11/26/21 [place] peripheral balloon angioplasty 11/24/21 temporal artery biopsy back surgery neck surgery cholecystectomy Family and Social History Cancer: No; Diabetes: Yes; Heart Disease: Yes - Mother; Hereditary Spherocytosis: No; Hypertension: Yes - Mother,Siblings; Kidney Disease: No; Lung Disease: No; Seizures: No; Stroke: No; Thyroid Problems: No; Tuberculosis: No; Former smoker; Marital Status - Married; Alcohol Use: Never; Drug Use: No History; Caffeine Use: Daily - coffee; Financial Concerns: No; Food, Clothing or Shelter Needs: No; Support System Lacking: No; Transportation Concerns: No Electronic Signature(s) Signed: 01/16/2022 11:22:15 AM By: Fredirick Maudlin MD FACS Signed: 01/16/2022 4:02:37 PM By: Sabas Sous By: Fredirick Maudlin on 01/16/2022 10:39:08 Justin [name] ([id number]) [id number] Page 11 of 11 -------------------------------------------------------------------------------- SuperBill Details Patient Name: Date of Service: Justin MELBURN, TREIBER 01/16/2022 Medical Record Number: [id number] Patient Account Number: [id number] Date of Birth/Sex: Treating RN: [birth year] (87 y.o. Janyth Contes Primary Care Provider: Center,  Romelle Starcher Other Clinician: Referring Provider: Treating Provider/Extender: Fredirick Maudlin Center, Bethany Weeks in Treatment: 4 Diagnosis Coding ICD-10 Codes Code Description E11.42 Type 2 diabetes mellitus with diabetic polyneuropathy N18.32 Chronic kidney disease, stage 3b I73.9 Peripheral vascular disease, unspecified I25.10 Atherosclerotic heart disease of native coronary artery without angina pectoris I10 Essential (primary) hypertension K86.1 Other chronic pancreatitis E11.621 Type 2 diabetes mellitus with foot ulcer T81.31XS Disruption of external operation (surgical) wound, not elsewhere classified, sequela L97.514  Non-pressure chronic ulcer of other part of right foot with necrosis of bone L97.516 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 without evidence of necrosis M86.171 Other acute osteomyelitis, right ankle and foot Facility Procedures : CPT4 Code: 29937169 Description: 67893 - DEB SUBQ TISSUE 20 SQ CM/< ICD-10 Diagnosis Description L97.514 Non-pressure chronic ulcer of other part of right foot with necrosis of bone L97.516 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 wit Modifier: hout evidence of nec Quantity: 1 rosis Physician Procedures : CPT4 Code Description Modifier [id number] - WC PHYS LEVEL 3 - EST PT 25 ICD-10 Diagnosis Description L97.514 Non-pressure chronic ulcer of other part of right foot with necrosis of bone L97.516 Non-pressure chronic ulcer of other part of right foot  with bone involvement without evidence of nec E11.621 Type 2 diabetes mellitus with foot ulcer T81.31XS Disruption of external operation (surgical) wound, not elsewhere classified, sequela Quantity: 1 rosis : [id number] - WC PHYS SUBQ TISS 20 SQ CM ICD-10 Diagnosis Description L97.514 Non-pressure chronic ulcer of other part of right foot with necrosis of bone L97.516 Non-pressure chronic ulcer of other part of right  foot with bone involvement without  evidence of nec Quantity: 1 rosis Electronic Signature(s) Signed: 01/16/2022 10:41:52 AM By: Fredirick Maudlin MD FACS Entered By: Fredirick Maudlin on 01/16/2022 10:41:52

## 2022-01-23 ENCOUNTER — Encounter (HOSPITAL_BASED_OUTPATIENT_CLINIC_OR_DEPARTMENT_OTHER): Payer: Medicare Other | Admitting: General Surgery

## 2022-01-23 DIAGNOSIS — I129 Hypertensive chronic kidney disease with stage 1 through stage 4 chronic kidney disease, or unspecified chronic kidney disease: Secondary | ICD-10-CM | POA: Diagnosis not present

## 2022-01-23 DIAGNOSIS — E11621 Type 2 diabetes mellitus with foot ulcer: Secondary | ICD-10-CM | POA: Diagnosis present

## 2022-01-23 DIAGNOSIS — N1832 Chronic kidney disease, stage 3b: Secondary | ICD-10-CM | POA: Diagnosis not present

## 2022-01-23 DIAGNOSIS — K861 Other chronic pancreatitis: Secondary | ICD-10-CM | POA: Diagnosis not present

## 2022-01-23 DIAGNOSIS — M86171 Other acute osteomyelitis, right ankle and foot: Secondary | ICD-10-CM | POA: Diagnosis not present

## 2022-01-23 DIAGNOSIS — L97514 Non-pressure chronic ulcer of other part of right foot with necrosis of bone: Secondary | ICD-10-CM | POA: Diagnosis not present

## 2022-01-23 DIAGNOSIS — M009 Pyogenic arthritis, unspecified: Secondary | ICD-10-CM | POA: Diagnosis not present

## 2022-01-23 DIAGNOSIS — M1990 Unspecified osteoarthritis, unspecified site: Secondary | ICD-10-CM | POA: Diagnosis not present

## 2022-01-25 ENCOUNTER — Inpatient Hospital Stay (HOSPITAL_COMMUNITY)
Admission: EM | Admit: 2022-01-25 | Discharge: 2022-01-29 | DRG: 240 | Disposition: A | Discharge status: Skilled Nursing Facility | Payer: Medicare Other | Attending: Internal Medicine | Admitting: Internal Medicine

## 2022-01-25 ENCOUNTER — Emergency Department (HOSPITAL_COMMUNITY): Payer: Medicare Other

## 2022-01-25 ENCOUNTER — Ambulatory Visit (INDEPENDENT_AMBULATORY_CARE_PROVIDER_SITE_OTHER): Payer: Medicare Other | Admitting: Podiatry

## 2022-01-25 ENCOUNTER — Other Ambulatory Visit: Payer: Self-pay

## 2022-01-25 ENCOUNTER — Telehealth: Payer: Self-pay | Admitting: Podiatry

## 2022-01-25 ENCOUNTER — Ambulatory Visit (INDEPENDENT_AMBULATORY_CARE_PROVIDER_SITE_OTHER): Payer: Medicare Other

## 2022-01-25 ENCOUNTER — Encounter (HOSPITAL_COMMUNITY): Payer: Self-pay

## 2022-01-25 DIAGNOSIS — I96 Gangrene, not elsewhere classified: Secondary | ICD-10-CM | POA: Diagnosis present

## 2022-01-25 DIAGNOSIS — L97514 Non-pressure chronic ulcer of other part of right foot with necrosis of bone: Secondary | ICD-10-CM | POA: Diagnosis not present

## 2022-01-25 DIAGNOSIS — E1169 Type 2 diabetes mellitus with other specified complication: Secondary | ICD-10-CM | POA: Diagnosis present

## 2022-01-25 DIAGNOSIS — E11649 Type 2 diabetes mellitus with hypoglycemia without coma: Secondary | ICD-10-CM | POA: Diagnosis not present

## 2022-01-25 DIAGNOSIS — E1151 Type 2 diabetes mellitus with diabetic peripheral angiopathy without gangrene: Secondary | ICD-10-CM | POA: Diagnosis present

## 2022-01-25 DIAGNOSIS — E782 Mixed hyperlipidemia: Secondary | ICD-10-CM | POA: Diagnosis present

## 2022-01-25 DIAGNOSIS — Z7902 Long term (current) use of antithrombotics/antiplatelets: Secondary | ICD-10-CM

## 2022-01-25 DIAGNOSIS — K219 Gastro-esophageal reflux disease without esophagitis: Secondary | ICD-10-CM | POA: Diagnosis present

## 2022-01-25 DIAGNOSIS — E11621 Type 2 diabetes mellitus with foot ulcer: Secondary | ICD-10-CM | POA: Diagnosis present

## 2022-01-25 DIAGNOSIS — Z7951 Long term (current) use of inhaled steroids: Secondary | ICD-10-CM

## 2022-01-25 DIAGNOSIS — D631 Anemia in chronic kidney disease: Secondary | ICD-10-CM | POA: Diagnosis present

## 2022-01-25 DIAGNOSIS — Z7982 Long term (current) use of aspirin: Secondary | ICD-10-CM

## 2022-01-25 DIAGNOSIS — I2510 Atherosclerotic heart disease of native coronary artery without angina pectoris: Secondary | ICD-10-CM | POA: Diagnosis not present

## 2022-01-25 DIAGNOSIS — Z79899 Other long term (current) drug therapy: Secondary | ICD-10-CM

## 2022-01-25 DIAGNOSIS — Z66 Do not resuscitate: Secondary | ICD-10-CM | POA: Diagnosis present

## 2022-01-25 DIAGNOSIS — Z1152 Encounter for screening for COVID-19: Secondary | ICD-10-CM

## 2022-01-25 DIAGNOSIS — Z833 Family history of diabetes mellitus: Secondary | ICD-10-CM

## 2022-01-25 DIAGNOSIS — E1122 Type 2 diabetes mellitus with diabetic chronic kidney disease: Secondary | ICD-10-CM | POA: Diagnosis not present

## 2022-01-25 DIAGNOSIS — Z87891 Personal history of nicotine dependence: Secondary | ICD-10-CM | POA: Diagnosis not present

## 2022-01-25 DIAGNOSIS — I739 Peripheral vascular disease, unspecified: Secondary | ICD-10-CM | POA: Diagnosis present

## 2022-01-25 DIAGNOSIS — D638 Anemia in other chronic diseases classified elsewhere: Secondary | ICD-10-CM | POA: Diagnosis present

## 2022-01-25 DIAGNOSIS — L97519 Non-pressure chronic ulcer of other part of right foot with unspecified severity: Secondary | ICD-10-CM | POA: Diagnosis not present

## 2022-01-25 DIAGNOSIS — L97418 Non-pressure chronic ulcer of right heel and midfoot with other specified severity: Secondary | ICD-10-CM | POA: Diagnosis present

## 2022-01-25 DIAGNOSIS — Z794 Long term (current) use of insulin: Secondary | ICD-10-CM | POA: Diagnosis not present

## 2022-01-25 DIAGNOSIS — I10 Essential (primary) hypertension: Secondary | ICD-10-CM | POA: Diagnosis not present

## 2022-01-25 DIAGNOSIS — F0390 Unspecified dementia without behavioral disturbance: Secondary | ICD-10-CM | POA: Diagnosis present

## 2022-01-25 DIAGNOSIS — K861 Other chronic pancreatitis: Secondary | ICD-10-CM | POA: Diagnosis not present

## 2022-01-25 DIAGNOSIS — M1990 Unspecified osteoarthritis, unspecified site: Secondary | ICD-10-CM | POA: Diagnosis present

## 2022-01-25 DIAGNOSIS — Z8249 Family history of ischemic heart disease and other diseases of the circulatory system: Secondary | ICD-10-CM

## 2022-01-25 DIAGNOSIS — E1165 Type 2 diabetes mellitus with hyperglycemia: Secondary | ICD-10-CM | POA: Diagnosis present

## 2022-01-25 DIAGNOSIS — G8191 Hemiplegia, unspecified affecting right dominant side: Secondary | ICD-10-CM | POA: Diagnosis present

## 2022-01-25 DIAGNOSIS — I129 Hypertensive chronic kidney disease with stage 1 through stage 4 chronic kidney disease, or unspecified chronic kidney disease: Secondary | ICD-10-CM | POA: Diagnosis present

## 2022-01-25 DIAGNOSIS — M86671 Other chronic osteomyelitis, right ankle and foot: Secondary | ICD-10-CM | POA: Diagnosis not present

## 2022-01-25 DIAGNOSIS — Z7985 Long-term (current) use of injectable non-insulin antidiabetic drugs: Secondary | ICD-10-CM

## 2022-01-25 DIAGNOSIS — I6381 Other cerebral infarction due to occlusion or stenosis of small artery: Secondary | ICD-10-CM | POA: Diagnosis not present

## 2022-01-25 DIAGNOSIS — M869 Osteomyelitis, unspecified: Secondary | ICD-10-CM | POA: Diagnosis present

## 2022-01-25 DIAGNOSIS — I25119 Atherosclerotic heart disease of native coronary artery with unspecified angina pectoris: Secondary | ICD-10-CM | POA: Diagnosis not present

## 2022-01-25 DIAGNOSIS — E1152 Type 2 diabetes mellitus with diabetic peripheral angiopathy with gangrene: Principal | ICD-10-CM | POA: Diagnosis present

## 2022-01-25 DIAGNOSIS — N1832 Chronic kidney disease, stage 3b: Secondary | ICD-10-CM | POA: Diagnosis not present

## 2022-01-25 DIAGNOSIS — E119 Type 2 diabetes mellitus without complications: Secondary | ICD-10-CM

## 2022-01-25 DIAGNOSIS — E1140 Type 2 diabetes mellitus with diabetic neuropathy, unspecified: Secondary | ICD-10-CM | POA: Diagnosis present

## 2022-01-25 LAB — PROTIME-INR
INR: 1.2 (ref 0.8–1.2)
Prothrombin Time: 14.9 seconds (ref 11.4–15.2)

## 2022-01-25 LAB — CBC WITH DIFFERENTIAL/PLATELET
Abs Immature Granulocytes: 0.03 10*3/uL (ref 0.00–0.07)
Basophils Absolute: 0.2 10*3/uL — ABNORMAL HIGH (ref 0.0–0.1)
Basophils Relative: 2 %
Eosinophils Absolute: 1 10*3/uL — ABNORMAL HIGH (ref 0.0–0.5)
Eosinophils Relative: 11 %
HCT: 28.2 % — ABNORMAL LOW (ref 39.0–52.0)
Hemoglobin: 9.1 g/dL — ABNORMAL LOW (ref 13.0–17.0)
Immature Granulocytes: 0 %
Lymphocytes Relative: 28 %
Lymphs Abs: 2.5 10*3/uL (ref 0.7–4.0)
MCH: 29.8 pg (ref 26.0–34.0)
MCHC: 32.3 g/dL (ref 30.0–36.0)
MCV: 92.5 fL (ref 80.0–100.0)
Monocytes Absolute: 0.6 10*3/uL (ref 0.1–1.0)
Monocytes Relative: 7 %
Neutro Abs: 4.5 10*3/uL (ref 1.7–7.7)
Neutrophils Relative %: 52 %
Platelets: 299 10*3/uL (ref 150–400)
RBC: 3.05 MIL/uL — ABNORMAL LOW (ref 4.22–5.81)
RDW: 16.1 % — ABNORMAL HIGH (ref 11.5–15.5)
WBC: 8.8 10*3/uL (ref 4.0–10.5)
nRBC: 0 % (ref 0.0–0.2)

## 2022-01-25 LAB — BASIC METABOLIC PANEL
Anion gap: 7 (ref 5–15)
BUN: 26 mg/dL — ABNORMAL HIGH (ref 8–23)
CO2: 25 mmol/L (ref 22–32)
Calcium: 8.5 mg/dL — ABNORMAL LOW (ref 8.9–10.3)
Chloride: 104 mmol/L (ref 98–111)
Creatinine, Ser: 1.66 mg/dL — ABNORMAL HIGH (ref 0.61–1.24)
GFR, Estimated: 40 mL/min — ABNORMAL LOW (ref >60)
Glucose, Bld: 116 mg/dL — ABNORMAL HIGH (ref 70–99)
Potassium: 3.5 mmol/L (ref 3.5–5.1)
Sodium: 136 mmol/L (ref 135–145)

## 2022-01-25 LAB — LACTIC ACID, PLASMA: Lactic Acid, Venous: 1.5 mmol/L (ref 0.5–1.9)

## 2022-01-25 LAB — GLUCOSE, CAPILLARY: Glucose-Capillary: 101 mg/dL — ABNORMAL HIGH (ref 70–99)

## 2022-01-25 MED ORDER — ACETAMINOPHEN 325 MG PO TABS
650.0000 mg | ORAL_TABLET | Freq: Four times a day (QID) | ORAL | Status: DC | PRN
  Administered 2022-01-28: 650 mg via ORAL
  Filled 2022-01-25: qty 2

## 2022-01-25 MED ORDER — FERROUS GLUCONATE 324 (38 FE) MG PO TABS
324.0000 mg | ORAL_TABLET | ORAL | Status: DC
  Administered 2022-01-26 – 2022-01-29 (×2): 324 mg via ORAL
  Filled 2022-01-25 (×2): qty 1

## 2022-01-25 MED ORDER — LORATADINE 10 MG PO TABS
10.0000 mg | ORAL_TABLET | Freq: Every day | ORAL | Status: DC
  Administered 2022-01-26 – 2022-01-29 (×3): 10 mg via ORAL
  Filled 2022-01-25 (×3): qty 1

## 2022-01-25 MED ORDER — ATORVASTATIN CALCIUM 40 MG PO TABS
40.0000 mg | ORAL_TABLET | Freq: Every day | ORAL | Status: DC
  Administered 2022-01-25 – 2022-01-28 (×4): 40 mg via ORAL
  Filled 2022-01-25 (×4): qty 1

## 2022-01-25 MED ORDER — PANTOPRAZOLE SODIUM 40 MG PO TBEC
40.0000 mg | DELAYED_RELEASE_TABLET | Freq: Every day | ORAL | Status: DC
  Administered 2022-01-26 – 2022-01-29 (×3): 40 mg via ORAL
  Filled 2022-01-25 (×3): qty 1

## 2022-01-25 MED ORDER — INSULIN ASPART 100 UNIT/ML IJ SOLN
0.0000 [IU] | Freq: Three times a day (TID) | INTRAMUSCULAR | Status: DC
  Administered 2022-01-26: 3 [IU] via SUBCUTANEOUS
  Administered 2022-01-27: 2 [IU] via SUBCUTANEOUS
  Administered 2022-01-27: 1 [IU] via SUBCUTANEOUS
  Administered 2022-01-28: 2 [IU] via SUBCUTANEOUS
  Administered 2022-01-28: 7 [IU] via SUBCUTANEOUS
  Administered 2022-01-29 (×2): 5 [IU] via SUBCUTANEOUS

## 2022-01-25 MED ORDER — INSULIN GLARGINE-YFGN 100 UNIT/ML ~~LOC~~ SOLN: 14.0000 [IU] | Freq: Every day | SUBCUTANEOUS | Status: DC

## 2022-01-25 MED ORDER — ONDANSETRON HCL 4 MG PO TABS: 4.0000 mg | ORAL_TABLET | Freq: Four times a day (QID) | ORAL | Status: DC | PRN

## 2022-01-25 MED ORDER — MOMETASONE FURO-FORMOTEROL FUM 200-5 MCG/ACT IN AERO
2.0000 | INHALATION_SPRAY | Freq: Two times a day (BID) | RESPIRATORY_TRACT | Status: DC
  Administered 2022-01-25 – 2022-01-29 (×7): 2 via RESPIRATORY_TRACT
  Filled 2022-01-25: qty 8.8

## 2022-01-25 MED ORDER — INSULIN GLARGINE-YFGN 100 UNIT/ML ~~LOC~~ SOLN
11.0000 [IU] | Freq: Every day | SUBCUTANEOUS | Status: DC
  Administered 2022-01-25 – 2022-01-29 (×4): 11 [IU] via SUBCUTANEOUS
  Filled 2022-01-25 (×5): qty 0.11

## 2022-01-25 MED ORDER — ACETAMINOPHEN 650 MG RE SUPP: 650.0000 mg | Freq: Four times a day (QID) | RECTAL | Status: DC | PRN

## 2022-01-25 MED ORDER — OXYCODONE HCL 5 MG PO TABS
2.5000 mg | ORAL_TABLET | ORAL | Status: DC | PRN
  Administered 2022-01-28: 2.5 mg via ORAL
  Filled 2022-01-25: qty 1

## 2022-01-25 MED ORDER — PRO-STAT PO LIQD: 30.0000 mL | Freq: Three times a day (TID) | ORAL | Status: DC

## 2022-01-25 MED ORDER — IPRATROPIUM-ALBUTEROL 0.5-2.5 (3) MG/3ML IN SOLN: 3.0000 mL | RESPIRATORY_TRACT | Status: DC | PRN

## 2022-01-25 MED ORDER — SENNOSIDES-DOCUSATE SODIUM 8.6-50 MG PO TABS: 1.0000 | ORAL_TABLET | Freq: Every evening | ORAL | Status: DC | PRN

## 2022-01-25 MED ORDER — ASPIRIN 81 MG PO TBEC
81.0000 mg | DELAYED_RELEASE_TABLET | Freq: Every day | ORAL | Status: DC
  Administered 2022-01-26: 81 mg via ORAL
  Filled 2022-01-25: qty 1

## 2022-01-25 MED ORDER — PROSOURCE PLUS PO LIQD
30.0000 mL | Freq: Three times a day (TID) | ORAL | Status: DC
  Administered 2022-01-25 – 2022-01-29 (×10): 30 mL via ORAL
  Filled 2022-01-25 (×11): qty 30

## 2022-01-25 MED ORDER — ISOSORBIDE MONONITRATE 10 MG PO TABS
10.0000 mg | ORAL_TABLET | Freq: Every day | ORAL | Status: DC
  Administered 2022-01-25 – 2022-01-28 (×4): 10 mg via ORAL
  Filled 2022-01-25 (×4): qty 1

## 2022-01-25 MED ORDER — ONDANSETRON HCL 4 MG/2ML IJ SOLN: 4.0000 mg | Freq: Four times a day (QID) | INTRAMUSCULAR | Status: DC | PRN

## 2022-01-25 MED ORDER — PANCRELIPASE (LIP-PROT-AMYL) 12000-38000 UNITS PO CPEP
24000.0000 [IU] | ORAL_CAPSULE | Freq: Three times a day (TID) | ORAL | Status: DC
  Administered 2022-01-25 – 2022-01-29 (×10): 24000 [IU] via ORAL
  Filled 2022-01-25 (×10): qty 2

## 2022-01-25 NOTE — ED Provider Notes (Signed)
[facility] DEPT Provider Note   CSN: [id number] Arrival date & time: 01/25/22  1025     History  Chief Complaint  Patient presents with   Wound Infection    Justin [name] is a 87 y.o. male.  Patient with a history of diabetes, hypertension, TIA, previous stroke presenting with concern for osteomyelitis in his foot.  Patient is a poor historian.  He reports amputation of his second toe about a week ago? by podiatry which is being followed by wound care.  He was seen by podiatry today and sent to the ER with concern for osteomyelitis and possible need for more surgery.  He states he has minimal pain.  He said increased drainage and discharge from his previous amputation site and.  Does not know if he had a fever.  Does not have vomiting.  No chest pain or shortness of breath.  He does not know if he is currently taking antibiotics.  He denies any blood thinner use  Chart review shows patient underwent second toe amputation in August 20 by [name] and has had worsening pain, drainage, wound infection since then with osteomyelitis on MRI on September 3.  He underwent revascularization by vascular surgery and told those options were maximized.  He is not currently on antibiotics.  Per podiatry notes the patient has concerning findings of possible osteomyelitis of his third toe needing further surgery.  The history is provided by the patient.       Home Medications Prior to Admission medications   Medication Sig Start Date End Date Taking? Authorizing Provider  acetaminophen (TYLENOL) 325 MG tablet Take 650 mg by mouth daily as needed (pain).    [provider]  Alcohol Swabs PADS Use to check blood sugar 3 times daily. Dx: E10.9 07/21/15   Ivar Drape D, PA  aspirin 81 MG chewable tablet Chew 81 mg by mouth daily.    [provider]  atorvastatin (LIPITOR) 40 MG tablet Take 40 mg by mouth at bedtime. 11/03/21   [provider]  Blood Glucose Calibration (ACCU-CHEK AVIVA) SOLN Use to check blood sugar 3 times daily. Dx: E10.9 07/21/15   Ivar Drape D, PA  Blood Glucose Monitoring Suppl (ACCU-CHEK AVIVA PLUS) w/Device KIT Use to check blood sugar 3 times daily. Dx: E10.9 09/27/15   Wardell Honour, MD  clopidogrel (PLAVIX) 75 MG tablet Take 1 tablet (75 mg total) by mouth daily with breakfast. 11/28/21   Nita Sells, MD  Emollient (AQUAPHOR ADV PROTECT HEALING) 41 % OINT Apply 1 application  topically daily. Mix with Lidocaine cream . Apply to feet/toes    [provider]  ferrous gluconate (FERGON) 324 MG tablet Take 324 mg by mouth See admin instructions. Take 324 mg by mouth on Monday, Tuesday, Wednesday, Thursday, Friday    [provider]  fluticasone furoate-vilanterol (BREO ELLIPTA) 100-25 MCG/ACT AEPB Inhale 1 puff into the lungs daily.    [provider]  glucagon (GLUCAGON EMERGENCY) 1 MG injection Inject 1 mg into the muscle once as needed. Patient taking differently: Inject 1 mg into the muscle once as needed (low blood sugar). 09/29/15   Philemon Kingdom, MD  glucose blood (ACCU-CHEK AVIVA PLUS) test strip Use to check blood sugar 3 times daily. Dx: E10.9 11/17/15   Philemon Kingdom, MD  insulin glargine-yfgn (SEMGLEE) 100 UNIT/ML injection Inject 0.1 mLs (10 Units total) into the skin daily. Patient taking differently: Inject 14 Units into the skin daily. 11/28/21  Nita Sells, MD  Insulin Pen Needle 32G X 4 MM MISC Use as directed to inject insulin tidwc 12/14/12   Shawnee Knapp, MD  Ipratropium-Albuterol (COMBIVENT) 20-100 MCG/ACT AERS respimat Inhale 1 puff into the lungs every 6 (six) hours as needed for wheezing. Patient taking differently: Inhale 1 puff into the lungs every 6 (six) hours as needed (COPD). 05/25/21   Patrecia Pour, MD  isosorbide mononitrate (ISMO) 10 MG tablet Take 10 mg by mouth at bedtime.    [provider]  Lancets  (ACCU-CHEK SOFT TOUCH) lancets Use to check blood sugar 3 times daily. Dx: E10.9 07/21/15   Ivar Drape D, PA  lidocaine (LMX) 4 % cream Apply 1 application  topically daily. Apply generously to right lower leg and heels daily-pain.    [provider]  loratadine (CLARITIN) 10 MG tablet Take 10 mg by mouth daily.    [provider]  NOVOLOG FLEXPEN 100 UNIT/ML FlexPen Inject 0-8 mLs into the skin in the morning and at bedtime. Per sliding scale: if is BS 70-200 give 0 units, if BS is 201-250 give 2 units, if BS is 251-300 give 4 units, if BS is 301-350 give 6 units, if BS is 351-400 give 8 units. If blood sugar is greater than 400 call MD. 10/18/21   [provider]  omeprazole (PRILOSEC) 20 MG capsule Take 20 mg by mouth every other day.    [provider]  oxyCODONE (OXY IR/ROXICODONE) 5 MG immediate release tablet Take 1 tablet (5 mg total) by mouth every 6 (six) hours as needed for severe pain or breakthrough pain. 12/12/21   Amin, Ankit Chirag, MD  Pancrelipase, Lip-Prot-Amyl, (CREON) 24000-76000 units CPEP Take 1 capsule by mouth in the morning, at noon, and at bedtime.    [provider]  senna-docusate (SENOKOT-S) 8.6-50 MG tablet Take 1 tablet by mouth at bedtime as needed for moderate constipation. 12/12/21   Damita Lack, MD  Skin Protectants, Misc. (MINERIN CREME EX) Apply 1 application  topically daily. Apply minerin cream to bilateral feet daily. R/t dry/cracked skin    [provider]  Spacer/Aero-Holding [facility] Use as directed with combivent. Ok to dispense any spacer that works with the inhaler being rxed to pt. 09/20/15   Shawnee Knapp, MD  TRULICITY 1.5 CE/0.2MV SOPN Inject 1.5 mg into the skin once a week. Fridays 05/19/21   [provider]      Allergies    Patient has no known allergies.    Review of Systems   Review of Systems  Constitutional:  Negative for activity change, appetite change and fever.   Respiratory:  Negative for cough and shortness of breath.   Cardiovascular:  Negative for chest pain.  Gastrointestinal:  Negative for abdominal pain, nausea and vomiting.  Musculoskeletal:  Positive for arthralgias and myalgias.  Skin:  Positive for wound.  Neurological:  Negative for headaches.   all other systems are negative except as noted in the HPI and PMH.    Physical Exam Updated Vital Signs BP 138/77 (BP Location: Left Arm)   Pulse 91   Temp 98.5 F (36.9 C) (Oral)   Resp 16   SpO2 100%  Physical Exam Vitals and nursing note reviewed.  Constitutional:      General: He is not in acute distress.    Appearance: He is well-developed. He is not ill-appearing.  HENT:     Head: Normocephalic and atraumatic.     Mouth/Throat:  Pharynx: No oropharyngeal exudate.  Eyes:     Conjunctiva/sclera: Conjunctivae normal.     Pupils: Pupils are equal, round, and reactive to light.  Neck:     Comments: No meningismus. Cardiovascular:     Rate and Rhythm: Normal rate and regular rhythm.     Heart sounds: Normal heart sounds. No murmur heard. Pulmonary:     Effort: Pulmonary effort is normal. No respiratory distress.     Breath sounds: Normal breath sounds.  Chest:     Chest wall: No tenderness.  Abdominal:     Palpations: Abdomen is soft.     Tenderness: There is no abdominal tenderness. There is no guarding or rebound.  Musculoskeletal:        General: Tenderness present. Normal range of motion.     Cervical back: Normal range of motion and neck supple.     Comments: Second toe amputation on the right with purulent drainage from wound base.  Creased erythema and tenderness to right third digit.  PT and DP present with Doppler  Skin:    General: Skin is warm.  Neurological:     Mental Status: He is alert and oriented to person, place, and time.     Cranial Nerves: No cranial nerve deficit.     Motor: No abnormal muscle tone.     Coordination: Coordination normal.      Comments:  5/5 strength throughout. CN 2-12 intact.Equal grip strength.   Psychiatric:        Behavior: Behavior normal.      ED Results / Procedures / Treatments   Labs (all labs ordered are listed, but only abnormal results are displayed) Labs Reviewed  BASIC METABOLIC PANEL - Abnormal; Notable for the following components:      Result Value   Glucose, Bld 116 (*)    BUN 26 (*)    Creatinine, Ser 1.66 (*)    Calcium 8.5 (*)    GFR, Estimated 40 (*)    All other components within normal limits  CBC WITH DIFFERENTIAL/PLATELET - Abnormal; Notable for the following components:   RBC 3.05 (*)    Hemoglobin 9.1 (*)    HCT 28.2 (*)    RDW 16.1 (*)    Eosinophils Absolute 1.0 (*)    Basophils Absolute 0.2 (*)    All other components within normal limits  CULTURE, BLOOD (ROUTINE X 2)  CULTURE, BLOOD (ROUTINE X 2)  LACTIC ACID, PLASMA  PROTIME-INR  LACTIC ACID, PLASMA    EKG None  Radiology DG Foot Complete Right  Result Date: 01/25/2022 CLINICAL DATA:  Nonhealing foot wounds. EXAM: RIGHT FOOT COMPLETE - 3+ VIEW COMPARISON:  MRI 12/10/2021 FINDINGS: There appears to be gas in the soft tissues overlying the amputated second toe. This is likely an open wound. There is cortical loss involving the second metatarsal head suspicious for osteomyelitis. I do not see any other obvious sites of osteomyelitis. Severe vascular disease is noted. IMPRESSION: 1. Plain film findings suggest osteomyelitis involving the second metatarsal head. 2. Severe vascular disease. 3. No other obvious sites of osteomyelitis. Electronically Signed   By: Marijo Sanes M.D.   On: 01/25/2022 13:27    Procedures Procedures    Medications Ordered in ED Medications - No data to display  ED Course/ Medical Decision Making/ A&P                           Medical Decision Making Amount and/or Complexity  of Data Reviewed Labs: ordered. Decision-making details documented in ED Course. Radiology: ordered and  independent interpretation performed. Decision-making details documented in ED Course. ECG/medicine tests: ordered and independent interpretation performed. Decision-making details documented in ED Course.  Risk Decision regarding hospitalization.  Sent from podiatry with concern for osteomyelitis of the foot and nonhealing wound.  Vitals are stable, no distress.  D/w Dr. Loel Lofty podiatry.  He is familiar with patient.  He states plan is to admit patient for likely transmetatarsal amputation later this week.  Recommends repeat MRI and hospitalist admission.  Do not start antibiotics at this time given clinical stability.  Per Dr. Loel Lofty.  Surgery will be planned on Saturday, October 21 for transmetatarsal amputation.  Does not recommend antibiotics unless patient has clinical signs of sepsis.  States repeat MRI not necessary  Labs today show stable anemia and creatinine.  Will hold IV antibiotics based on podiatry recommendations.  Repeat MRI not recommended.  Admission for surgery on October 21.  Discussed with Dr. Olevia Bowens will admit patient.        Final Clinical Impression(s) / ED Diagnoses Final diagnoses:  Osteomyelitis of right foot, unspecified type [facility])    Rx / DC Orders ED Discharge Orders     None         Benney Sommerville, Annie Main, MD 01/25/22 1616

## 2022-01-25 NOTE — Telephone Encounter (Signed)
Facility calling to clairfy why patient is being transported to hospital  .

## 2022-01-25 NOTE — ED Provider Triage Note (Signed)
Emergency Medicine Provider Triage Evaluation Note  Justin [name] , a 87 y.o. male  was evaluated in triage.  Pt complains of nonhealing wounds to right foot.  Seen at podiatry today, lytic changes on x-ray, sent here for possible further surgery  Review of Systems  Positive: Nonhealing wounds Negative: fever  Physical Exam  BP 138/77 (BP Location: Left Arm)   Pulse 91   Temp 98.5 F (36.9 C) (Oral)   Resp 16   SpO2 100%  Gen:   Awake, no distress    Resp:  Normal effort   MSK:   Moves extremities without difficulty   Other:  S/p 2nd right toe amputation, wound to third digit  Medical Decision Making  Medically screening exam initiated at 10:58 AM.  Appropriate orders placed.  [name] was informed that the remainder of the evaluation will be completed by another provider, this initial triage assessment does not replace that evaluation, and the importance of remaining in the ED until their evaluation is complete.      Malvin Johns, MD 01/25/22 1059

## 2022-01-25 NOTE — H&P (Signed)
History and Physical    Patient: Justin [name] QHU:[id number] DOB: 02/14/1935 DOA: 01/25/2022 DOS: the patient was seen and examined on 01/25/2022 PCP: [facility] [place]  Patient coming from: SNF  Chief Complaint:  Chief Complaint  Patient presents with   Wound Infection   HPI: Justin [name] is a 87 y.o. male with medical history significant of acute metabolic encephalopathy O2 arthritis, type 2 diabetes, hypertension, hyperlipidemia iron deficiency anemia, acute pancreatitis, colon adenomas, history of TIA, history of stroke who was sent to the emergency department from the wound care clinic for evaluation of recent right second toe amputation.  The patient has dementia and is unable to provide much history.  His history is given by his daughter.  He is will do answer simple questions.  He denied headache, abdominal, back or chest pain at the moment.  He is not having any significant pain on the right foot.  ED course: Initial vital signs were temperature 98.5 F, pulse 91, respirations 16, BP 130/77 mmHg O2 sat 100% on room air.  No medications were given in the emergency department.  Podiatry asked [place] to give antibiotics unless the patient becomes septic and no repeat MRI is needed as well.  Lab work: CBC is her white count of 8.8, hemoglobin 9.1 g/dL platelets 299.  PT and INR were normal.  Lactic acid was unremarkable.  BMP showed a glucose of 116, BUN 26, creatinine 1.66 and calcium 8.5 mg/deciliter.  The rest of the BMP values were normal.  Imaging: Right foot x-ray with suggestion of osteomyelitis involving the second metatarsal head.  There is severe vascular disease.  No other sites of osteomyelitis.   Review of Systems: As mentioned in the history of present illness. All other systems reviewed and are negative. Past Medical History:  Diagnosis Date   Acute metabolic encephalopathy 4/65/6812   Arthritis    Diabetes mellitus    HTN (hypertension)     Hypercholesteremia    Iron deficiency    Pancreatitis, acute 06/02/2012   Diagnosed 05/2012    Personal history of colonic adenomas 04/15/2013   04/15/2013 2 diminutive polyps     Stroke [facility])    TIA (transient ischemic attack)    Past Surgical History:  Procedure Laterality Date   ABDOMINAL AORTOGRAM W/LOWER EXTREMITY Right 11/24/2021   Procedure: ABDOMINAL AORTOGRAM W/LOWER EXTREMITY;  Surgeon: Cherre Robins, MD;  Location: [place] [facility];  Service: Cardiovascular;  Laterality: Right;   AMPUTATION TOE Right 11/26/2021   Procedure: AMPUTATION TOE;  Surgeon: [name], DPM;  Location: [facility];  Service: Podiatry;  Laterality: Right;   ARTERY BIOPSY  02/26/2011   Procedure: BIOPSY TEMPORAL ARTERY;  Surgeon: Willey Blade, MD;  Location: [facility];  Service: General;  Laterality: Left;  Left temporal artery biospy   [facility]   PERIPHERAL VASCULAR BALLOON ANGIOPLASTY Right 11/24/2021   Procedure: PERIPHERAL VASCULAR BALLOON ANGIOPLASTY;  Surgeon: Cherre Robins, MD;  Location: [place] [facility];  Service: Cardiovascular;  Laterality: Right;  POPLITEAL AND PERONEAL   TEMPORAL ARTERY BIOPSY / LIGATION  02/26/2011   left side   UPPER GASTROINTESTINAL ENDOSCOPY     Social History:  reports that he quit smoking about 31 years ago. His smoking use included cigarettes. He has never used smokeless tobacco. He reports that he does not drink  alcohol and does not use drugs.  No Known Allergies  Family History  Problem Relation Age of Onset   Heart disease Mother    Amblyopia Father    Diabetes Sister        x 2   Heart attack Sister     Prior to Admission medications   Medication Sig Start Date End Date Taking? Authorizing Provider  acetaminophen (TYLENOL) 325 MG tablet Take 650 mg by mouth daily as needed (pain).    [provider]  Alcohol Swabs PADS Use to check blood  sugar 3 times daily. Dx: E10.9 07/21/15   Ivar Drape D, PA  aspirin 81 MG chewable tablet Chew 81 mg by mouth daily.    [provider]  atorvastatin (LIPITOR) 40 MG tablet Take 40 mg by mouth at bedtime. 11/03/21   [provider]  Blood Glucose Calibration (ACCU-CHEK AVIVA) SOLN Use to check blood sugar 3 times daily. Dx: E10.9 07/21/15   Ivar Drape D, PA  Blood Glucose Monitoring Suppl (ACCU-CHEK AVIVA PLUS) w/Device KIT Use to check blood sugar 3 times daily. Dx: E10.9 09/27/15   Wardell Honour, MD  clopidogrel (PLAVIX) 75 MG tablet Take 1 tablet (75 mg total) by mouth daily with breakfast. 11/28/21   Nita Sells, MD  Emollient (AQUAPHOR ADV PROTECT HEALING) 41 % OINT Apply 1 application  topically daily. Mix with Lidocaine cream . Apply to feet/toes    [provider]  ferrous gluconate (FERGON) 324 MG tablet Take 324 mg by mouth See admin instructions. Take 324 mg by mouth on Monday, Tuesday, Wednesday, Thursday, Friday    [provider]  fluticasone furoate-vilanterol (BREO ELLIPTA) 100-25 MCG/ACT AEPB Inhale 1 puff into the lungs daily.    [provider]  glucagon (GLUCAGON EMERGENCY) 1 MG injection Inject 1 mg into the muscle once as needed. Patient taking differently: Inject 1 mg into the muscle once as needed (low blood sugar). 09/29/15   Philemon Kingdom, MD  glucose blood (ACCU-CHEK AVIVA PLUS) test strip Use to check blood sugar 3 times daily. Dx: E10.9 11/17/15   Philemon Kingdom, MD  insulin glargine-yfgn (SEMGLEE) 100 UNIT/ML injection Inject 0.1 mLs (10 Units total) into the skin daily. Patient taking differently: Inject 14 Units into the skin daily. 11/28/21   Nita Sells, MD  Insulin Pen Needle 32G X 4 MM MISC Use as directed to inject insulin tidwc 12/14/12   Shawnee Knapp, MD  Ipratropium-Albuterol (COMBIVENT) 20-100 MCG/ACT AERS respimat Inhale 1 puff into the lungs every 6 (six) hours as needed for  wheezing. Patient taking differently: Inhale 1 puff into the lungs every 6 (six) hours as needed (COPD). 05/25/21   Patrecia Pour, MD  isosorbide mononitrate (ISMO) 10 MG tablet Take 10 mg by mouth at bedtime.    [provider]  Lancets (ACCU-CHEK SOFT TOUCH) lancets Use to check blood sugar 3 times daily. Dx: E10.9 07/21/15   Ivar Drape D, PA  lidocaine (LMX) 4 % cream Apply 1 application  topically daily. Apply generously to right lower leg and heels daily-pain.    [provider]  loratadine (CLARITIN) 10 MG tablet Take 10 mg by mouth daily.    [provider]  NOVOLOG FLEXPEN 100 UNIT/ML FlexPen Inject 0-8 mLs into the skin in the morning and at bedtime. Per sliding scale: if is BS 70-200 give 0 units, if BS is 201-250 give 2 units, if BS is 251-300 give 4 units, if BS is 301-350 give  6 units, if BS is 351-400 give 8 units. If blood sugar is greater than 400 call MD. 10/18/21   [provider]  omeprazole (PRILOSEC) 20 MG capsule Take 20 mg by mouth every other day.    [provider]  oxyCODONE (OXY IR/ROXICODONE) 5 MG immediate release tablet Take 1 tablet (5 mg total) by mouth every 6 (six) hours as needed for severe pain or breakthrough pain. 12/12/21   Amin, Ankit Chirag, MD  Pancrelipase, Lip-Prot-Amyl, (CREON) 24000-76000 units CPEP Take 1 capsule by mouth in the morning, at noon, and at bedtime.    [provider]  senna-docusate (SENOKOT-S) 8.6-50 MG tablet Take 1 tablet by mouth at bedtime as needed for moderate constipation. 12/12/21   Damita Lack, MD  Skin Protectants, Misc. (MINERIN CREME EX) Apply 1 application  topically daily. Apply minerin cream to bilateral feet daily. R/t dry/cracked skin    [provider]  Spacer/Aero-[facility] Use as directed with combivent. Ok to dispense any spacer that works with the inhaler being rxed to pt. 09/20/15   Shawnee Knapp, MD  TRULICITY 1.5 ZL/9.3TT SOPN Inject 1.5  mg into the skin once a week. Fridays 05/19/21   [provider]    Physical Exam: Vitals:   01/25/22 1031 01/25/22 1235 01/25/22 1350  BP: 138/77 (!) 143/77 138/68  Pulse: 91  72  Resp: 16  17  Temp: 98.5 F (36.9 C) 97.7 F (36.5 C)   TempSrc: Oral Oral   SpO2: 100%  100%   Physical Exam Vitals and nursing note reviewed.  Constitutional:      General: He is awake. He is not in acute distress.    Appearance: Normal appearance.  HENT:     Head: Normocephalic.     Nose: No rhinorrhea.     Mouth/Throat:     Mouth: Mucous membranes are moist.  Eyes:     General: No scleral icterus.    Pupils: Pupils are equal, round, and reactive to light.  Neck:     Vascular: No JVD.  Cardiovascular:     Rate and Rhythm: Normal rate and regular rhythm.     Heart sounds: S1 normal and S2 normal.  Pulmonary:     Effort: Pulmonary effort is normal.     Breath sounds: No wheezing, rhonchi or rales.  Abdominal:     General: Bowel sounds are normal. There is no distension.     Palpations: Abdomen is soft.     Tenderness: There is no abdominal tenderness. There is no guarding.  Musculoskeletal:     Cervical back: Neck supple.     Right lower leg: No edema.     Left lower leg: No edema.  Feet:     Right foot:     Skin integrity: Ulcer and erythema present.     Comments: Right second toe amputation that has ulcerated with edema, calor, erythema and mild TTP.  Please see picture below. Skin:    General: Skin is warm and dry.  Neurological:     General: No focal deficit present.     Mental Status: He is alert and oriented to person, place, and time. Mental status is at baseline.  Psychiatric:        Mood and Affect: Mood normal.        Behavior: Behavior normal. Behavior is cooperative.      Data Reviewed:  There are no new results to review at this time.  Assessment and Plan:  Principal Problem:   Osteomyelitis of right foot ([place]) Admit to MedSurg/inpatient. Podiatry  consult appreciated. No need for antibiotics. No need for further imaging. Continue analgesics as needed. Follow-up CBC and CMP in AM.    Active Problems:   PAD (peripheral artery disease) ([name]) Continue aspirin and atorvastatin. Hold clopidogrel due to upcoming surgery.    Chronic kidney disease, stage 3b ([place]) Monitor renal function electrolytes. Avoid nephrotoxins.    Type 2 diabetes mellitus with long-term current use of insulin ([name]) Carbohydrate modified diet. Continue Semglee 10 units daily. CBG monitoring before meals and bedtime. Regular insulin sliding scale before meals. Hold Trulicity tomorrow due to upcoming procedure    Coronary artery disease Continue statin and ASA. Holding Plavix at the moment.    Essential hypertension Other than isosorbide 10 mg at bedtime,  currently not on antihypertensives. Monitor blood pressure.    Chronic pancreatitis ([name]) Continue Creon supplementation with meals.    Anemia of chronic disease Monitor hematocrit and hemoglobin. Transfuse as needed.     Advance Care Planning:   Code Status: DNR.  Consults: Dr. Loel Lofty (podiatry).  Family Communication: His daughter was at bedside.  Severity of Illness: The appropriate patient status for this patient is INPATIENT. Inpatient status is judged to be reasonable and necessary in order to provide the required intensity of service to ensure the patient's safety. The patient's presenting symptoms, physical exam findings, and initial radiographic and laboratory data in the context of their chronic comorbidities is felt to place them at high risk for further clinical deterioration. Furthermore, it is not anticipated that the patient will be medically stable for discharge from the hospital within 2 midnights of admission.   * I certify that at the point of admission it is my clinical judgment that the patient will require inpatient hospital care spanning beyond 2 midnights from the  point of admission due to high intensity of service, high risk for further deterioration and high frequency of surveillance required.*  Author: Reubin Milan, MD 01/25/2022 2:09 PM  For on call review [email or web address].   This document was prepared using Dragon voice recognition software and may contain some unintended transcription errors.

## 2022-01-25 NOTE — Consult Note (Signed)
PODIATRY CONSULTATION  NAME Justin [name] MRN [id number] DOB [birth year] DOA 01/25/2022   Reason for consult:  Chief Complaint  Patient presents with   Wound Infection    Attending/Consulting physician: Tennis Must, MD  History of present illness: 86 y.o. male  diabetes Type 2 with neuropathy, hypertension, TIA, previous stroke presenting with nonhealing right 2nd toe amputation site with concern for residual osteomyelitis and worsening of the wound. Was seen in clinic today and decision was made to send to the hospital due to concern for right 3rd toe osteomyelitis and wound infection. He has been following with wound care for the right foot outpatient. Reports the wound is not healing. He currently denies feeling ill or any N/V/F/C.   Past Medical History:  Diagnosis Date   Acute metabolic encephalopathy 7/54/4920   Arthritis    Diabetes mellitus    HTN (hypertension)    Hypercholesteremia    Iron deficiency    Pancreatitis, acute 06/02/2012   Diagnosed 05/2012    Personal history of colonic adenomas 04/15/2013   04/15/2013 2 diminutive polyps     Stroke [name])    TIA (transient ischemic attack)        Latest Ref Rng & Units 12/20/2021    2:34 PM 12/12/2021    5:58 AM 12/11/2021    3:12 AM  CBC  WBC 3.8 - 10.8 Thousand/uL 9.7  11.7  8.9   Hemoglobin 13.2 - 17.1 g/dL 10.6  10.4  11.1   Hematocrit 38.5 - 50.0 % 32.6  31.3  33.3   Platelets 140 - 400 Thousand/uL 360  308  321        Latest Ref Rng & Units 12/20/2021    2:34 PM 12/12/2021    2:53 AM 12/11/2021    3:12 AM  BMP  Glucose 65 - 99 mg/dL 188  74  116   BUN 7 - 25 mg/dL 24  <5  19   Creatinine 0.70 - 1.22 mg/dL 2.01  0.71  1.61   BUN/Creat Ratio 6 - 22 (calc) 12     Sodium 135 - 146 mmol/L 132  134  135   Potassium 3.5 - 5.3 mmol/L 4.2  4.1  4.5   Chloride 98 - 110 mmol/L 100  105  100   CO2 20 - 32 mmol/L '22  20  27   ' Calcium 8.6 - 10.3 mg/dL 9.3  7.5  9.2       Physical Exam: Lower Extremity Exam Vasc: R  - PT 1/4 palpable, DP 1/4 palpable. Cap refill relayed  L - PT 1/4 palpable, DP 1/4 palpable. Cap refill delayed to digits  Derm: R - Open ulceraiton at 2nd toe amputation probes to 2nd met head, 3rd toe with edema and wound to bone at the lateral aspect. No purulent drainage. Darkening of the skin of the forefoot.   L - Normal temp/texture/turgor with no open lesion or clinical signs of infection  MSK:  R - s/p R 2nd toe amputation  L -  No gross deformities. Compartments soft, non-tender, compressible  Neuro: R - Gross sensation diminished. Gross motor function intact   L - Gross sensation diminished. Gross motor function intact    ASSESSMENT/PLAN OF CARE 87 y.o. male with PMHx significant for  diabetes Type 2 with neuropathy and PAD, hypertension, TIA, previous stroke with nonhealing right 2nd toe amputation site with concern for 2nd metatarsal head osteomyelitis as well as 3rd digit osteomyelitis.   AF VSS WBC 8.8  XR R foot:  Plain film findings suggest osteomyelitis involving the second metatarsal head. Severe vascular disease.   - Discussed options with patient. Believe best option for him is to proceed with TMA of the right foot vs ongoing wound care as this will likely be unsucsseful in setting or residual OM and PAD. He is aware of risk for nonhealing of amputation at the TMA level and possible need for higher level amputation.  - Plan for transmetatarsal amputation with possible tendo achilles lengthening Saturday 10/21 AM with Dr. Posey Pronto. -  NPO after MN 10/21 0001 - Hold antibiotics until cultures taken intraoperatively - Anticoagulation: per primary, hold prior to surgery Sat AM - Wound care: Betadine dressing applied today, can leave in place until OR - WB status: WBAT to Right foot  - Will continue to follow   Thank you for the consult.  Please contact me directly with any questions or concerns.           [name], DPM [facility]     [address], [place] [address]                Office [phone]  Fax [phone]

## 2022-01-25 NOTE — Plan of Care (Signed)
  Problem: Clinical Measurements: Goal: Ability to maintain clinical measurements within normal limits will improve Outcome: Progressing   Problem: Coping: Goal: Level of anxiety will decrease Outcome: Progressing   Problem: Activity: Goal: Risk for activity intolerance will decrease Outcome: Progressing   Problem: Safety: Goal: Ability to remain free from injury will improve Outcome: Progressing   Problem: Pain Managment: Goal: General experience of comfort will improve Outcome: Progressing   Problem: Skin Integrity: Goal: Risk for impaired skin integrity will decrease Outcome: Progressing

## 2022-01-25 NOTE — ED Triage Notes (Signed)
Pt sent by wound care.  Pt resides at [facility] Recent toe amputations per patient

## 2022-01-25 NOTE — Progress Notes (Signed)
Subjective:   Patient ID: Justin [name], male   DOB: 87 y.o.   MRN: [id number]   HPI Patient presents with caregiver stating that wound care center sent him here and that he is now developing infection of his third toe and that he still is under wound care for the second after amputation but it is not healing properly   ROS      Objective:  Physical Exam  Neurovascular unchanged from previous visits with the foot being relatively warm to the midfoot and then cooler as we get closer to the digits with a ulceration of the right second interspace after having had amputation of the second toe several months ago by Dr. Earleen Newport.  Third toe shows discoloration and trauma with tunneling indicating most likely bone infection and patient also is not strong medically and has had history of pneumonia over the last few months     Assessment:  Very difficult condition with nonhealing of previous amputation second digit right with now stress on the third digit right and continued nonhealing of the second and     Plan:  H&P x-ray reviewed and we are sending patient to the emergency room and patient also saw Dr. Earleen Newport today.  The best chance that this patient has most likely assuming he can be stabilized medically would be transmetatarsal amputation.  There is a chance this also will not heal and ultimately may require BKA or AKA amputation but hopefully at that level he would heal and have some stability.  All of this was explained to caregiver today  X-rays indicate there appears to be lysis changes of the proximal phalanx right third digit

## 2022-01-26 ENCOUNTER — Inpatient Hospital Stay (HOSPITAL_COMMUNITY): Payer: Medicare Other

## 2022-01-26 DIAGNOSIS — I10 Essential (primary) hypertension: Secondary | ICD-10-CM

## 2022-01-26 DIAGNOSIS — K861 Other chronic pancreatitis: Secondary | ICD-10-CM

## 2022-01-26 DIAGNOSIS — N1832 Chronic kidney disease, stage 3b: Secondary | ICD-10-CM | POA: Diagnosis not present

## 2022-01-26 DIAGNOSIS — Z794 Long term (current) use of insulin: Secondary | ICD-10-CM

## 2022-01-26 DIAGNOSIS — I25119 Atherosclerotic heart disease of native coronary artery with unspecified angina pectoris: Secondary | ICD-10-CM | POA: Diagnosis not present

## 2022-01-26 DIAGNOSIS — E1169 Type 2 diabetes mellitus with other specified complication: Secondary | ICD-10-CM

## 2022-01-26 DIAGNOSIS — E782 Mixed hyperlipidemia: Secondary | ICD-10-CM

## 2022-01-26 DIAGNOSIS — E1122 Type 2 diabetes mellitus with diabetic chronic kidney disease: Secondary | ICD-10-CM

## 2022-01-26 DIAGNOSIS — M869 Osteomyelitis, unspecified: Secondary | ICD-10-CM | POA: Diagnosis not present

## 2022-01-26 DIAGNOSIS — K219 Gastro-esophageal reflux disease without esophagitis: Secondary | ICD-10-CM | POA: Diagnosis present

## 2022-01-26 LAB — COMPREHENSIVE METABOLIC PANEL
ALT: 11 U/L (ref 0–44)
AST: 17 U/L (ref 15–41)
Albumin: 2.6 g/dL — ABNORMAL LOW (ref 3.5–5.0)
Alkaline Phosphatase: 74 U/L (ref 38–126)
Anion gap: 5 (ref 5–15)
BUN: 24 mg/dL — ABNORMAL HIGH (ref 8–23)
CO2: 24 mmol/L (ref 22–32)
Calcium: 8.1 mg/dL — ABNORMAL LOW (ref 8.9–10.3)
Chloride: 107 mmol/L (ref 98–111)
Creatinine, Ser: 1.39 mg/dL — ABNORMAL HIGH (ref 0.61–1.24)
GFR, Estimated: 50 mL/min — ABNORMAL LOW (ref >60)
Glucose, Bld: 73 mg/dL (ref 70–99)
Potassium: 3.1 mmol/L — ABNORMAL LOW (ref 3.5–5.1)
Sodium: 136 mmol/L (ref 135–145)
Total Bilirubin: 0.6 mg/dL (ref 0.3–1.2)
Total Protein: 7.1 g/dL (ref 6.5–8.1)

## 2022-01-26 LAB — GLUCOSE, CAPILLARY
Glucose-Capillary: 51 mg/dL — ABNORMAL LOW (ref 70–99)
Glucose-Capillary: 92 mg/dL (ref 70–99)
Glucose-Capillary: 206 mg/dL — ABNORMAL HIGH (ref 70–99)
Glucose-Capillary: 103 mg/dL — ABNORMAL HIGH (ref 70–99)
Glucose-Capillary: 200 mg/dL — ABNORMAL HIGH (ref 70–99)

## 2022-01-26 LAB — CBC
HCT: 24.4 % — ABNORMAL LOW (ref 39.0–52.0)
Hemoglobin: 8.1 g/dL — ABNORMAL LOW (ref 13.0–17.0)
MCH: 30.1 pg (ref 26.0–34.0)
MCHC: 33.2 g/dL (ref 30.0–36.0)
MCV: 90.7 fL (ref 80.0–100.0)
Platelets: 263 10*3/uL (ref 150–400)
RBC: 2.69 MIL/uL — ABNORMAL LOW (ref 4.22–5.81)
RDW: 15.9 % — ABNORMAL HIGH (ref 11.5–15.5)
WBC: 9.1 10*3/uL (ref 4.0–10.5)
nRBC: 0 % (ref 0.0–0.2)

## 2022-01-26 LAB — SURGICAL PCR SCREEN
MRSA, PCR: NEGATIVE
Staphylococcus aureus: NEGATIVE

## 2022-01-26 LAB — RESP PANEL BY RT-PCR (FLU A&B, COVID) ARPGX2
Influenza A by PCR: NEGATIVE
Influenza B by PCR: NEGATIVE
SARS Coronavirus 2 by RT PCR: NEGATIVE

## 2022-01-26 MED ORDER — DM-GUAIFENESIN ER 30-600 MG PO TB12: 1.0000 | ORAL_TABLET | Freq: Two times a day (BID) | ORAL | Status: DC | PRN

## 2022-01-26 MED ORDER — SODIUM CHLORIDE 0.9 % IV SOLN
2.0000 g | Freq: Two times a day (BID) | INTRAVENOUS | Status: DC
  Administered 2022-01-26 – 2022-01-28 (×4): 2 g via INTRAVENOUS
  Filled 2022-01-26 (×5): qty 12.5

## 2022-01-26 MED ORDER — METRONIDAZOLE 500 MG/100ML IV SOLN
500.0000 mg | Freq: Two times a day (BID) | INTRAVENOUS | Status: DC
  Administered 2022-01-26 – 2022-01-28 (×4): 500 mg via INTRAVENOUS
  Filled 2022-01-26 (×5): qty 100

## 2022-01-26 MED ORDER — POTASSIUM CHLORIDE CRYS ER 20 MEQ PO TBCR
40.0000 meq | EXTENDED_RELEASE_TABLET | Freq: Once | ORAL | Status: AC
  Administered 2022-01-26: 40 meq via ORAL
  Filled 2022-01-26: qty 2

## 2022-01-26 NOTE — Assessment & Plan Note (Signed)
   Patient underwent transmetatarsal amputation by Dr. Posey Pronto successfully morning of 10/22  Oral doxycycline initiated for 10-day course per recommendations of Dr. Posey Pronto  Monitor postoperatively, likely discharge in the morning.  As needed analgesics for associated pain  Monitoring for evidence of progressive infection

## 2022-01-26 NOTE — Assessment & Plan Note (Signed)
.   Patient been placed on Accu-Cheks before every meal and nightly with sliding scale insulin . Patient had an episode of hypoglycemia this morning which quickly resolved with administration of oral glucose, food . Holding home regimen of hypoglycemics . Heme globin A1c 12/2021 8.9%. . Diabetic Diet

## 2022-01-26 NOTE — Assessment & Plan Note (Signed)
   Documented history of hypertension however patient is mostly normotensive without any antihypertensive therapy here  As needed intravenous angiotensins for markedly elevated blood pressure

## 2022-01-26 NOTE — Assessment & Plan Note (Signed)
.   Continuing home regimen of lipid lowering therapy.  

## 2022-01-26 NOTE — Assessment & Plan Note (Signed)
.   Patient is currently chest pain free . Patient given dose of baby aspirin this morning, further doses will be held until operative intervention . Continue home regimen of statin therapy

## 2022-01-26 NOTE — Progress Notes (Signed)
Communicated with Dr. Posey Pronto via secure chat. He notified that patients surgery is now scheduled for Sunday, not tomorrow. Daughters contact info provided to him and he relayed he would give her a call in the morning to discuss. Patient no longer npo and now has antibiotics ordered. Informed patient of the change to his surgery day. He voiced understanding. He is alert and oriented to self and situation but not date, and he is having intermittent periods of confusion to where he is. Will continue to monitor throughout the shift.

## 2022-01-26 NOTE — Progress Notes (Signed)
Hypoglycemic Event  CBG: 51  Treatment: 4 oz juice/soda  Symptoms: None  Follow-up CBG: [id number] CBG Result:92  Possible Reasons for Event: Inadequate meal intake  Comments/MD notified:[place]. No new orders    Lise Auer

## 2022-01-26 NOTE — TOC Initial Note (Signed)
Transition of Care [facility]) - Initial/Assessment Note    Patient Details  Name: Justin [name] MRN: [id number] Date of Birth: 03/02/1935  [facility] CM/SW Contact:    Lennart Pall, LCSW Phone Number: 01/26/2022, 11:26 AM  Clinical Narrative:                 Met with pt and daughter today to review potential dc planning needs.  Both report that pt has been a LTC resident at [facility]  for ~ 2 1/2 yrs and plan will be for him to return when medically cleared.  Have confirmed this with Admission Coordinator at facility as well.  Will alert weekend TOC to plan and will continue to follow to assist with this discharge.  Expected Discharge Plan: Long Term Nursing Home Barriers to Discharge: Continued Medical Work up   Patient Goals and CMS Choice Patient states their goals for this hospitalization and ongoing recovery are:: return to LTC      Expected Discharge Plan and Services Expected Discharge Plan: [place] In-house Referral: Clinical Social Work     Living arrangements for the past 2 months: [place]                 DME Arranged: N/A DME Agency: NA                  Prior Living Arrangements/Services Living arrangements for the past 2 months: [place] Lives with:: Facility Resident Patient language and need for interpreter reviewed:: Yes Do you feel safe going back to the place where you live?: Yes      Need for Family Participation in Patient Care: No (Comment) Care giver support system in place?: Yes (comment)   Criminal Activity/Legal Involvement Pertinent to Current Situation/Hospitalization: No - Comment as needed  Activities of Daily Living Home Assistive Devices/Equipment: None ADL Screening (condition at time of admission) Patient's cognitive ability adequate to safely complete daily activities?: Yes Is the patient deaf or have difficulty hearing?: No Does the patient have difficulty seeing,  even when wearing glasses/contacts?: No Does the patient have difficulty concentrating, remembering, or making decisions?: No Patient able to express need for assistance with ADLs?: Yes Does the patient have difficulty dressing or bathing?: No Independently performs ADLs?: Yes (appropriate for developmental age) Does the patient have difficulty walking or climbing stairs?: No Weakness of Legs: Right Weakness of Arms/Hands: Right  Permission Sought/Granted Permission sought to share information with : Family Supports Permission granted to share information with : Yes, Verbal Permission Granted  Share Information with NAME: Jeancarlos Marchena  Permission granted to share info w AGENCY: [place] granted to share info w Relationship: daughter  Permission granted to share info w Contact Information: [phone]  Emotional Assessment Appearance:: Appears stated age Attitude/Demeanor/Rapport: Gracious Affect (typically observed): Accepting Orientation: : Oriented to Self, Oriented to Place, Oriented to  Time, Oriented to Situation Alcohol / Substance Use: Not Applicable Psych Involvement: No (comment)  Admission diagnosis:  Osteomyelitis of right foot ([place]) [M86.9] Osteomyelitis of right foot, unspecified type [facility]) [M86.9] Patient Active Problem List   Diagnosis Date Noted   Osteomyelitis of right foot ([place]) 01/25/2022   AKI (acute kidney injury) ([place])    Altered mental status    PAD (peripheral artery disease) ([place]) 12/09/2021   Acute encephalopathy 12/09/2021   Balanitis 12/09/2021   Pressure injury of skin 11/25/2021   Diabetic foot ([place]) 11/16/2021   Diabetic foot infection ([place])  11/15/2021   Intertrigo 11/15/2021   History of CVA (cerebrovascular accident) 05/21/2021   Chronic kidney disease, stage 3b ([place]) 05/21/2021   Mixed diabetic hyperlipidemia associated with type 2 diabetes mellitus ([place]) 05/21/2021   Polyneuropathy due to type 2 diabetes mellitus ([place])  05/21/2021   Leukocytosis 05/21/2021   Type 2 diabetes mellitus with stage 3b chronic kidney disease, with long-term current use of insulin ([place]) 09/29/2015   Arthritis 09/06/2015   Diabetic peripheral neuropathy ([place]) 08/17/2015   OA (osteoarthritis) of knee 08/17/2015   Primary osteoarthritis of both knees 08/11/2015   Primary osteoarthritis of left hip 08/11/2015   Hammer toe, acquired 09/30/2013   Personal history of colonic adenomas 04/15/2013   Chronic pancreatitis ([place]) 02/26/2013   Anemia of chronic disease 02/26/2013   Goals of care, counseling/discussion 02/26/2013   Essential hypertension 12/14/2012   Testosterone deficiency 10/22/2011   Headache above the eye region 02/22/2011   Coronary artery disease 02/22/2011   PCP:  [facility] [place]:   [place], [place] - [address] [place] [address] Phone: [phone] Fax: [phone]     Social Determinants of Health (SDOH) Interventions    Readmission Risk Interventions    01/26/2022   11:24 AM  Readmission Risk Prevention Plan  Transportation Screening Complete  PCP or Specialist Appt within 3-5 Days Complete  HRI or [place] Complete  Social Work Consult for [place] Planning/Counseling Complete  Palliative Care Screening Not Applicable  Medication Review [employer]) Complete

## 2022-01-26 NOTE — Assessment & Plan Note (Signed)
   Continue home regimen of pancreatic supplements

## 2022-01-26 NOTE — Assessment & Plan Note (Signed)
Continuing home regimen of daily PPI therapy.  

## 2022-01-26 NOTE — Assessment & Plan Note (Signed)
Strict intake and output monitoring Creatinine near baseline Minimizing nephrotoxic agents as much as possible Serial chemistries to monitor renal function and electrolytes  

## 2022-01-26 NOTE — Hospital Course (Addendum)
87 year old male who is a resident of [facility] with past medical history of multiple prior strokes including frontal-parietal and bilateral subcortical strokes 05/2021 with residual right-sided hemiparesis, coronary artery disease, gastroesophageal reflux disease, diabetes mellitus type 2, hyperlipidemia, hypertension and chronic pancreatitis who presents to [facility] emergency department from the wound care clinic for evaluation of nonhealing wound of recent right second toe amputation.  Patient had been complaining of ongoing pain.  In the emergency department case was discussed with podiatry who was concerned for possible osteomyelitis of the third toe and need for ongoing surgery.  Patient was evaluate by podiatry in the emergency department.  The day recommended to abstain from antibiotics unless the patient becomes septic.  Plans were for patient to undergo transmetatarsal amputation with possible tendo Achilles lengthening with Dr. Posey Pronto.  Patient was admitted to the hospital service.  Patient was placed on intravenous antibiotics.  Patient's blood sugars were noted to be somewhat brittle throughout the hospitalization, initially hypoglycemic but as the hospitalization progressed he became hyperglycemic.  Insulin regimen was titrated somewhat for patient to receive basal bolus insulin therapy  Transmetatarsal amputation of the right foot with Achilles tendon lengthening was performed on 45/80 without complication.  Patient was monitored overnight without issue and discharged back to skilled nursing facility the morning of 10/23.  Patient is being discharged in improved and stable condition.  Patient is to follow-up with his podiatrist Dr. Posey Pronto in 1 week and is to keep the dressing in place until that time.

## 2022-01-26 NOTE — Plan of Care (Signed)
  Problem: Clinical Measurements: Goal: Ability to maintain clinical measurements within normal limits will improve Outcome: Progressing Goal: Will remain free from infection Outcome: Progressing Goal: Diagnostic test results will improve Outcome: Progressing Goal: Respiratory complications will improve Outcome: Progressing   Problem: Metabolic: Goal: Ability to maintain appropriate glucose levels will improve Outcome: Progressing

## 2022-01-26 NOTE — Progress Notes (Signed)
PROGRESS NOTE   Justin [name]  NKN:[id number] DOB: Dec 27, 1934 DOA: 01/25/2022 PCP: [facility] [place]   Date of Service: the patient was seen and examined on 01/26/2022  Brief Narrative:  87 year old male who is a resident of [facility] skilled nursing with past medical history of multiple prior strokes including frontal-parietal and bilateral subcortical strokes 05/2021 with residual right-sided hemiparesis, coronary artery disease, gastroesophageal reflux disease, diabetes mellitus type 2, hyperlipidemia, hypertension and chronic pancreatitis who presents to [facility] emergency department from the wound care clinic for evaluation of nonhealing wound of recent right second toe amputation.  Patient had been complaining of ongoing pain.  In the emergency department case was discussed with podiatry who was concerned for possible osteomyelitis of the third toe and need for ongoing surgery.  Patient was evaluate by podiatry in the emergency department.  The day recommended to abstain from antibiotics unless the patient becomes septic.  Plans are for patient to undergo transmetatarsal amputation with possible tendo Achilles lengthening on Saturday 10/21 in the morning with Dr. Posey Pronto.  The hospitalist group was then called to assess the patient for admission to the hospital.   Assessment and Plan: * Osteomyelitis of second toe of right foot [facility]) Patient presenting from podiatry clinic with nonhealing right second toe amputation site Concern for second metatarsal head osteomyelitis as well as third digit osteomyelitis by podiatry Patient is already been evaluated by Dr. Loel Lofty with podiatry evening of 10/19 Tentative plan is for patient to undergo operative intervention, likely transmetatarsal amputation, with Dr. Posey Pronto morning of 10/21 Podiatry requesting that we hold antibiotics unless patient becomes septic until operative intervention and cultures As needed analgesics for  associated pain Monitoring for evidence of progressive infection  Chronic kidney disease, stage 3b ([place]) Strict intake and output monitoring Creatinine near baseline Minimizing nephrotoxic agents as much as possible Serial chemistries to monitor renal function and electrolytes   Type 2 diabetes mellitus with stage 3b chronic kidney disease, with long-term current use of insulin ([place]) Patient been placed on Accu-Cheks before every meal and nightly with sliding scale insulin Patient had an episode of hypoglycemia this morning which quickly resolved with administration of oral glucose, food Holding home regimen of hypoglycemics Heme globin A1c 12/2021 8.9%. Diabetic Diet   Chronic pancreatitis ([place]) Continue home regimen of pancreatic supplements  Coronary artery disease Patient is currently chest pain free Patient given dose of baby aspirin this morning, further doses will be held until operative intervention Continue home regimen of statin therapy    Essential hypertension Documented history of hypertension however patient is mostly normotensive without any antihypertensive therapy here As needed intravenous angiotensins for markedly elevated blood pressure  Mixed diabetic hyperlipidemia associated with type 2 diabetes mellitus ([place]) Continuing home regimen of lipid lowering therapy.    GERD without esophagitis Continuing home regimen of daily PPI therapy.        Subjective: Patient complaining of ongoing right foot pain, moderate intensity, sharp in quality, radiating proximally, worse with movement of the affected extremity.   Physical Exam:  Vitals:   01/26/22 0541 01/26/22 0742 01/26/22 0959 01/26/22 1308  BP: 106/61  (!) 144/70 125/72  Pulse: 86  91 89  Resp: '18  18 20  '$ Temp: 99.1 F (37.3 C)  98.2 F (36.8 C) 98.4 F (36.9 C)  TempSrc: Oral  Oral Oral  SpO2: 100% 99% 96% 98%  Weight:      Height:        Constitutional: Awake alert  no associated  distress.   Skin: Right foot dressing is clean dry and intact.   Eyes: Pupils are equally reactive to light.  No evidence of scleral icterus or conjunctival pallor.  ENMT: Moist mucous membranes noted.  Posterior pharynx clear of any exudate or lesions.   Respiratory: Basilar rales noted, no evidence of wheezing.  Normal respiratory effort. No accessory muscle use.  Cardiovascular: Regular rate and rhythm, no murmurs / rubs / gallops. No extremity edema. 2+ pedal pulses. No carotid bruits.  Abdomen: Abdomen is soft and nontender.  No evidence of intra-abdominal masses.  Positive bowel sounds noted in all quadrants.   Musculoskeletal: Dressing of right foot as noted in the skin examination.  No other significant deformities noted.  Data Reviewed:  I have personally reviewed and interpreted labs, imaging.  Significant findings are   CBC: Recent Labs  Lab 01/25/22 1245 01/26/22 0328  WBC 8.8 9.1  NEUTROABS 4.5  --   HGB 9.1* 8.1*  HCT 28.2* 24.4*  MCV 92.5 90.7  PLT 299 488   Basic Metabolic Panel: Recent Labs  Lab 01/25/22 1245 01/26/22 0328  NA 136 136  K 3.5 3.1*  CL 104 107  CO2 25 24  GLUCOSE 116* 73  BUN 26* 24*  CREATININE 1.66* 1.39*  CALCIUM 8.5* 8.1*   GFR: Estimated Creatinine Clearance: 38.7 mL/min (A) (by C-G formula based on SCr of 1.39 mg/dL (H)). Liver Function Tests: Recent Labs  Lab 01/26/22 0328  AST 17  ALT 11  ALKPHOS 74  BILITOT 0.6  PROT 7.1  ALBUMIN 2.6*    Coagulation Profile: Recent Labs  Lab 01/25/22 1230  INR 1.2      Code Status:  DNR  code status decision has been confirmed with: daugther Family Communication: Daughter is at bedside who has been updated on plan of care   Severity of Illness:  The appropriate patient status for this patient is INPATIENT. Inpatient status is judged to be reasonable and necessary in order to provide the required intensity of service to ensure the patient's safety. The patient's presenting  symptoms, physical exam findings, and initial radiographic and laboratory data in the context of their chronic comorbidities is felt to place them at high risk for further clinical deterioration. Furthermore, it is not anticipated that the patient will be medically stable for discharge from the hospital within 2 midnights of admission.   * I certify that at the point of admission it is my clinical judgment that the patient will require inpatient hospital care spanning beyond 2 midnights from the point of admission due to high intensity of service, high risk for further deterioration and high frequency of surveillance required.*  Time spent:  51 minutes  Author:  Vernelle Emerald MD  01/26/2022 6:44 PM

## 2022-01-27 DIAGNOSIS — M869 Osteomyelitis, unspecified: Secondary | ICD-10-CM | POA: Diagnosis not present

## 2022-01-27 DIAGNOSIS — I25119 Atherosclerotic heart disease of native coronary artery with unspecified angina pectoris: Secondary | ICD-10-CM | POA: Diagnosis not present

## 2022-01-27 DIAGNOSIS — N1832 Chronic kidney disease, stage 3b: Secondary | ICD-10-CM | POA: Diagnosis not present

## 2022-01-27 DIAGNOSIS — K861 Other chronic pancreatitis: Secondary | ICD-10-CM | POA: Diagnosis not present

## 2022-01-27 LAB — CBC WITH DIFFERENTIAL/PLATELET
Abs Immature Granulocytes: 0.01 10*3/uL (ref 0.00–0.07)
Basophils Absolute: 0.1 10*3/uL (ref 0.0–0.1)
Basophils Relative: 2 %
Eosinophils Absolute: 1.1 10*3/uL — ABNORMAL HIGH (ref 0.0–0.5)
Eosinophils Relative: 11 %
HCT: 25.2 % — ABNORMAL LOW (ref 39.0–52.0)
Hemoglobin: 8.1 g/dL — ABNORMAL LOW (ref 13.0–17.0)
Immature Granulocytes: 0 %
Lymphocytes Relative: 34 %
Lymphs Abs: 3.2 10*3/uL (ref 0.7–4.0)
MCH: 30.1 pg (ref 26.0–34.0)
MCHC: 32.1 g/dL (ref 30.0–36.0)
MCV: 93.7 fL (ref 80.0–100.0)
Monocytes Absolute: 0.9 10*3/uL (ref 0.1–1.0)
Monocytes Relative: 9 %
Neutro Abs: 4.1 10*3/uL (ref 1.7–7.7)
Neutrophils Relative %: 44 %
Platelets: 249 10*3/uL (ref 150–400)
RBC: 2.69 MIL/uL — ABNORMAL LOW (ref 4.22–5.81)
RDW: 15.7 % — ABNORMAL HIGH (ref 11.5–15.5)
WBC: 9.5 10*3/uL (ref 4.0–10.5)
nRBC: 0 % (ref 0.0–0.2)

## 2022-01-27 LAB — COMPREHENSIVE METABOLIC PANEL
ALT: 10 U/L (ref 0–44)
AST: 15 U/L (ref 15–41)
Albumin: 2.5 g/dL — ABNORMAL LOW (ref 3.5–5.0)
Alkaline Phosphatase: 69 U/L (ref 38–126)
Anion gap: 5 (ref 5–15)
BUN: 26 mg/dL — ABNORMAL HIGH (ref 8–23)
CO2: 23 mmol/L (ref 22–32)
Calcium: 8.1 mg/dL — ABNORMAL LOW (ref 8.9–10.3)
Chloride: 106 mmol/L (ref 98–111)
Creatinine, Ser: 1.35 mg/dL — ABNORMAL HIGH (ref 0.61–1.24)
GFR, Estimated: 51 mL/min — ABNORMAL LOW (ref >60)
Glucose, Bld: 139 mg/dL — ABNORMAL HIGH (ref 70–99)
Potassium: 3.7 mmol/L (ref 3.5–5.1)
Sodium: 134 mmol/L — ABNORMAL LOW (ref 135–145)
Total Bilirubin: 0.6 mg/dL (ref 0.3–1.2)
Total Protein: 7 g/dL (ref 6.5–8.1)

## 2022-01-27 LAB — GLUCOSE, CAPILLARY
Glucose-Capillary: 118 mg/dL — ABNORMAL HIGH (ref 70–99)
Glucose-Capillary: 196 mg/dL — ABNORMAL HIGH (ref 70–99)
Glucose-Capillary: 124 mg/dL — ABNORMAL HIGH (ref 70–99)
Glucose-Capillary: 221 mg/dL — ABNORMAL HIGH (ref 70–99)

## 2022-01-27 LAB — MAGNESIUM: Magnesium: 1.8 mg/dL (ref 1.7–2.4)

## 2022-01-27 NOTE — Inpatient Diabetes Management (Signed)
Inpatient Diabetes Program Recommendations  AACE/ADA: New Consensus Statement on Inpatient Glycemic Control (2015)  Target Ranges:  Prepandial:   less than 140 mg/dL      Peak postprandial:   less than 180 mg/dL (1-2 hours)      Critically ill patients:  140 - 180 mg/dL   Lab Results  Component Value Date   GLUCAP 118 (H) 01/27/2022   HGBA1C 8.9 (H) 12/10/2021    Latest Reference Range & Units 01/26/22 07:36 01/26/22 08:45 01/26/22 11:34 01/26/22 16:33 01/26/22 20:29 01/27/22 07:34  Glucose-Capillary 70 - 99 mg/dL 51 (L) 92 206 (H) 103 (H) 200 (H) 118 (H)  (L): Data is abnormally low (H): Data is abnormally high Review of Glycemic Control  Diabetes history: DM2 Outpatient Diabetes medications: Lantus 11 units daily Current orders for Inpatient glycemic control: Semglee 11 units daily, Novolog 0-9 units TID  Inpatient Diabetes Program Recommendations:   Received diabetes coordinator consult. CBGs today look much better. No hypoglycemia. Patient may be eating better today.   Recommend same insulin dosage at discharge without Novolog scale. Will need to follow up with PCP if needed.  Will continue to monitor blood sugars while in the hospital.  Harvel Ricks RN BSN CDE Diabetes Coordinator Pager: [phone]  8am-5pm

## 2022-01-27 NOTE — Progress Notes (Signed)
PROGRESS NOTE   Justin [name]  YIR:[id number] DOB: 03/18/1935 DOA: 01/25/2022 PCP: [facility] [place]   Date of Service: the patient was seen and examined on 01/27/2022  Brief Narrative:  87 year old male who is a resident of [facility] skilled nursing with past medical history of multiple prior strokes including frontal-parietal and bilateral subcortical strokes 05/2021 with residual right-sided hemiparesis, coronary artery disease, gastroesophageal reflux disease, diabetes mellitus type 2, hyperlipidemia, hypertension and chronic pancreatitis who presents to [facility] emergency department from the wound care clinic for evaluation of nonhealing wound of recent right second toe amputation.  Patient had been complaining of ongoing pain.  In the emergency department case was discussed with podiatry who was concerned for possible osteomyelitis of the third toe and need for ongoing surgery.  Patient was evaluate by podiatry in the emergency department.  The day recommended to abstain from antibiotics unless the patient becomes septic.  Plans are for patient to undergo transmetatarsal amputation with possible tendo Achilles lengthening on Sunday 10/22  in the morning with Dr. Posey Pronto.  Patient was admitted to the hospital service.  Patient was placed on intravenous antibiotics.   Assessment and Plan: * Osteomyelitis of second toe of right foot [facility]) Patient presenting from podiatry clinic with nonhealing right second toe amputation site Concern for second metatarsal head osteomyelitis as well as third digit osteomyelitis by podiatry Patient is already been evaluated by Dr. Loel Lofty with podiatry evening of 10/19 Tentative plan is for patient to undergo operative intervention, likely transmetatarsal amputation, with Dr. Posey Pronto morning of 10/22 Per my discussion with podiatry on 10/20, patient placed on intravenous antibiotics with intravenous cefepime and metronidazole. As needed  analgesics for associated pain Monitoring for evidence of progressive infection  Chronic kidney disease, stage 3b ([name]) Strict intake and output monitoring Creatinine near baseline Minimizing nephrotoxic agents as much as possible Serial chemistries to monitor renal function and electrolytes   Type 2 diabetes mellitus with stage 3b chronic kidney disease, with long-term current use of insulin ([place]) Patient been placed on Accu-Cheks before every meal and nightly with sliding scale insulin Patient had an episode of hypoglycemia this morning which quickly resolved with administration of oral glucose, food Holding home regimen of hypoglycemics Heme globin A1c 12/2021 8.9%. Diabetic Diet   Chronic pancreatitis ([place]) Continue home regimen of pancreatic supplements  Coronary artery disease Patient is currently chest pain free Aspirin held until operative intervention Continue home regimen of statin therapy    Essential hypertension Documented history of hypertension however patient is mostly normotensive without any antihypertensive therapy here As needed intravenous angiotensins for markedly elevated blood pressure  Mixed diabetic hyperlipidemia associated with type 2 diabetes mellitus ([place]) Continuing home regimen of lipid lowering therapy.    GERD without esophagitis Continuing home regimen of daily PPI therapy.        Subjective:  Patient complaining of right foot pain, moderate in intensity, dull in quality, radiating proximally.  Physical Exam:  Vitals:   01/26/22 2033 01/27/22 0500 01/27/22 0942 01/27/22 1946  BP: 120/75 122/70    Pulse: 83 78    Resp: 18 18    Temp: 98.7 F (37.1 C) 98.9 F (37.2 C)    TempSrc: Oral Oral    SpO2: 98% 100% 98% 98%  Weight:      Height:         Constitutional: Awake alert and oriented x3, no associated distress.   Skin: Right foot dressing clean dry and intact.  Skin turgor  is poor.   Eyes: Pupils are equally  reactive to light.  No evidence of scleral icterus or conjunctival pallor.  ENMT: Moist mucous membranes noted.  Posterior pharynx clear of any exudate or lesions.   Respiratory: clear to auscultation bilaterally, no wheezing, no crackles. Normal respiratory effort. No accessory muscle use.  Cardiovascular: Regular rate and rhythm, no murmurs / rubs / gallops. No extremity edema. 2+ pedal pulses. No carotid bruits.  Abdomen: Abdomen is soft and nontender.  No evidence of intra-abdominal masses.  Positive bowel sounds noted in all quadrants.   Musculoskeletal: No joint deformity upper and lower extremities. Good ROM, no contractures. Normal muscle tone.    Data Reviewed:  I have personally reviewed and interpreted labs, imaging.  Significant findings are   CBC: Recent Labs  Lab 01/25/22 1245 01/26/22 0328 01/27/22 0338  WBC 8.8 9.1 9.5  NEUTROABS 4.5  --  4.1  HGB 9.1* 8.1* 8.1*  HCT 28.2* 24.4* 25.2*  MCV 92.5 90.7 93.7  PLT 299 263 673   Basic Metabolic Panel: Recent Labs  Lab 01/25/22 1245 01/26/22 0328 01/27/22 0338  NA 136 136 134*  K 3.5 3.1* 3.7  CL 104 107 106  CO2 '25 24 23  '$ GLUCOSE 116* 73 139*  BUN 26* 24* 26*  CREATININE 1.66* 1.39* 1.35*  CALCIUM 8.5* 8.1* 8.1*  MG  --   --  1.8   GFR: Estimated Creatinine Clearance: 39.8 mL/min (A) (by C-G formula based on SCr of 1.35 mg/dL (H)). Liver Function Tests: Recent Labs  Lab 01/26/22 0328 01/27/22 0338  AST 17 15  ALT 11 10  ALKPHOS 74 69  BILITOT 0.6 0.6  PROT 7.1 7.0  ALBUMIN 2.6* 2.5*    Coagulation Profile: Recent Labs  Lab 01/25/22 1230  INR 1.2     Code Status:  DNR    Severity of Illness:  The appropriate patient status for this patient is INPATIENT. Inpatient status is judged to be reasonable and necessary in order to provide the required intensity of service to ensure the patient's safety. The patient's presenting symptoms, physical exam findings, and initial radiographic and  laboratory data in the context of their chronic comorbidities is felt to place them at high risk for further clinical deterioration. Furthermore, it is not anticipated that the patient will be medically stable for discharge from the hospital within 2 midnights of admission.   * I certify that at the point of admission it is my clinical judgment that the patient will require inpatient hospital care spanning beyond 2 midnights from the point of admission due to high intensity of service, high risk for further deterioration and high frequency of surveillance required.*  Time spent:  40 minutes  Author:  Vernelle Emerald MD  01/27/2022 8:43 PM

## 2022-01-27 NOTE — Progress Notes (Signed)
Pharmacy Antibiotic Note  Justin [name] is a 87 y.o. male admitted on 01/25/2022  from the wound care clinic for evaluation of nonhealing wound of recent right second toe amputation.  Past medical history of multiple prior strokes including frontal-parietal and bilateral subcortical strokes 05/2021 with residual right-sided hemiparesis, coronary artery disease, gastroesophageal reflux disease, diabetes mellitus type 2, hyperlipidemia, hypertension and chronic pancreatitis. Pharmacy has been consulted to dose cefepime for osteomyelitis.  Plan: Cefepime 2gm IV q12h Follow renal function, cultures and clinical course  Height: '6\' 2"'$  (188 cm) Weight: 70.4 kg (155 lb 3.3 oz) IBW/kg (Calculated) : 82.2  Temp (24hrs), Avg:98.6 F (37 C), Min:98.2 F (36.8 C), Max:99.1 F (37.3 C)  Recent Labs  Lab 01/25/22 1230 01/25/22 1245 01/26/22 0328  WBC  --  8.8 9.1  CREATININE  --  1.66* 1.39*  LATICACIDVEN 1.5  --   --     Estimated Creatinine Clearance: 38.7 mL/min (A) (by C-G formula based on SCr of 1.39 mg/dL (H)).    No Known Allergies  Antimicrobials this admission: 10/19 cefepime >> 10/19 flagyl >>  Dose adjustments this admission:   Microbiology results: 10/19 BCx:  10/20 MRSA PCR: negative  Thank you for allowing pharmacy to be a part of this patient's care.  Dolly Rias RPh 01/27/2022, 2:03 AM

## 2022-01-27 NOTE — Plan of Care (Signed)
  Problem: Clinical Measurements: Goal: Respiratory complications will improve Outcome: Progressing   Problem: Clinical Measurements: Goal: Cardiovascular complication will be avoided Outcome: Progressing   Problem: Elimination: Goal: Will not experience complications related to bowel motility Outcome: Progressing   Problem: Pain Managment: Goal: General experience of comfort will improve Outcome: Progressing   Problem: Safety: Goal: Ability to remain free from injury will improve Outcome: Progressing

## 2022-01-27 NOTE — Plan of Care (Signed)
°  Problem: Coping: °Goal: Level of anxiety will decrease °Outcome: Progressing °  °

## 2022-01-27 NOTE — Progress Notes (Signed)
Pt daughter Nehal Shives would like a call from Dr. Posey Pronto. Her number is [phone]. She would like an update on the plan overall.

## 2022-01-28 ENCOUNTER — Inpatient Hospital Stay (HOSPITAL_COMMUNITY): Payer: Medicare Other | Admitting: Certified Registered Nurse Anesthetist

## 2022-01-28 ENCOUNTER — Encounter (HOSPITAL_COMMUNITY): Payer: Self-pay | Admitting: Certified Registered Nurse Anesthetist

## 2022-01-28 ENCOUNTER — Other Ambulatory Visit: Payer: Self-pay

## 2022-01-28 ENCOUNTER — Encounter (HOSPITAL_COMMUNITY)
Admission: EM | Disposition: A | Discharge status: Skilled Nursing Facility | Payer: Self-pay | Source: Home / Self Care | Attending: Internal Medicine

## 2022-01-28 ENCOUNTER — Inpatient Hospital Stay (HOSPITAL_COMMUNITY): Payer: Medicare Other

## 2022-01-28 DIAGNOSIS — M86671 Other chronic osteomyelitis, right ankle and foot: Secondary | ICD-10-CM | POA: Diagnosis not present

## 2022-01-28 DIAGNOSIS — I25119 Atherosclerotic heart disease of native coronary artery with unspecified angina pectoris: Secondary | ICD-10-CM | POA: Diagnosis not present

## 2022-01-28 DIAGNOSIS — M869 Osteomyelitis, unspecified: Secondary | ICD-10-CM

## 2022-01-28 DIAGNOSIS — I2510 Atherosclerotic heart disease of native coronary artery without angina pectoris: Secondary | ICD-10-CM

## 2022-01-28 DIAGNOSIS — K861 Other chronic pancreatitis: Secondary | ICD-10-CM | POA: Diagnosis not present

## 2022-01-28 DIAGNOSIS — I10 Essential (primary) hypertension: Secondary | ICD-10-CM

## 2022-01-28 DIAGNOSIS — E1169 Type 2 diabetes mellitus with other specified complication: Secondary | ICD-10-CM

## 2022-01-28 DIAGNOSIS — N1832 Chronic kidney disease, stage 3b: Secondary | ICD-10-CM | POA: Diagnosis not present

## 2022-01-28 DIAGNOSIS — Z87891 Personal history of nicotine dependence: Secondary | ICD-10-CM

## 2022-01-28 HISTORY — PX: TRANSMETATARSAL AMPUTATION: SHX6197

## 2022-01-28 LAB — COMPREHENSIVE METABOLIC PANEL
ALT: 8 U/L (ref 0–44)
AST: 17 U/L (ref 15–41)
Albumin: 2.6 g/dL — ABNORMAL LOW (ref 3.5–5.0)
Alkaline Phosphatase: 71 U/L (ref 38–126)
Anion gap: 6 (ref 5–15)
BUN: 26 mg/dL — ABNORMAL HIGH (ref 8–23)
CO2: 22 mmol/L (ref 22–32)
Calcium: 8.1 mg/dL — ABNORMAL LOW (ref 8.9–10.3)
Chloride: 106 mmol/L (ref 98–111)
Creatinine, Ser: 1.39 mg/dL — ABNORMAL HIGH (ref 0.61–1.24)
GFR, Estimated: 50 mL/min — ABNORMAL LOW (ref >60)
Glucose, Bld: 168 mg/dL — ABNORMAL HIGH (ref 70–99)
Potassium: 3.5 mmol/L (ref 3.5–5.1)
Sodium: 134 mmol/L — ABNORMAL LOW (ref 135–145)
Total Bilirubin: 0.6 mg/dL (ref 0.3–1.2)
Total Protein: 7 g/dL (ref 6.5–8.1)

## 2022-01-28 LAB — CBC WITH DIFFERENTIAL/PLATELET
Abs Immature Granulocytes: 0.03 10*3/uL (ref 0.00–0.07)
Basophils Absolute: 0.2 10*3/uL — ABNORMAL HIGH (ref 0.0–0.1)
Basophils Relative: 1 %
Eosinophils Absolute: 1.1 10*3/uL — ABNORMAL HIGH (ref 0.0–0.5)
Eosinophils Relative: 10 %
HCT: 25.6 % — ABNORMAL LOW (ref 39.0–52.0)
Hemoglobin: 8.2 g/dL — ABNORMAL LOW (ref 13.0–17.0)
Immature Granulocytes: 0 %
Lymphocytes Relative: 27 %
Lymphs Abs: 2.8 10*3/uL (ref 0.7–4.0)
MCH: 30 pg (ref 26.0–34.0)
MCHC: 32 g/dL (ref 30.0–36.0)
MCV: 93.8 fL (ref 80.0–100.0)
Monocytes Absolute: 0.7 10*3/uL (ref 0.1–1.0)
Monocytes Relative: 7 %
Neutro Abs: 5.7 10*3/uL (ref 1.7–7.7)
Neutrophils Relative %: 55 %
Platelets: 255 10*3/uL (ref 150–400)
RBC: 2.73 MIL/uL — ABNORMAL LOW (ref 4.22–5.81)
RDW: 15.5 % (ref 11.5–15.5)
WBC: 10.4 10*3/uL (ref 4.0–10.5)
nRBC: 0 % (ref 0.0–0.2)

## 2022-01-28 LAB — PROTIME-INR
INR: 1.2 (ref 0.8–1.2)
Prothrombin Time: 14.7 seconds (ref 11.4–15.2)

## 2022-01-28 LAB — GLUCOSE, CAPILLARY
Glucose-Capillary: 153 mg/dL — ABNORMAL HIGH (ref 70–99)
Glucose-Capillary: 140 mg/dL — ABNORMAL HIGH (ref 70–99)
Glucose-Capillary: 181 mg/dL — ABNORMAL HIGH (ref 70–99)
Glucose-Capillary: 344 mg/dL — ABNORMAL HIGH (ref 70–99)
Glucose-Capillary: 263 mg/dL — ABNORMAL HIGH (ref 70–99)
Glucose-Capillary: 316 mg/dL — ABNORMAL HIGH (ref 70–99)

## 2022-01-28 LAB — MAGNESIUM: Magnesium: 1.6 mg/dL — ABNORMAL LOW (ref 1.7–2.4)

## 2022-01-28 SURGERY — AMPUTATION, FOOT, TRANSMETATARSAL
Anesthesia: Choice | Site: Foot | Laterality: Right

## 2022-01-28 MED ORDER — PHENYLEPHRINE 80 MCG/ML (10ML) SYRINGE FOR IV PUSH (FOR BLOOD PRESSURE SUPPORT)
PREFILLED_SYRINGE | INTRAVENOUS | Status: DC | PRN
  Administered 2022-01-28: 80 ug via INTRAVENOUS
  Administered 2022-01-28 (×4): 160 ug via INTRAVENOUS

## 2022-01-28 MED ORDER — DOXYCYCLINE HYCLATE 100 MG PO TABS
100.0000 mg | ORAL_TABLET | Freq: Two times a day (BID) | ORAL | Status: DC
  Filled 2022-01-28: qty 1

## 2022-01-28 MED ORDER — PHENYLEPHRINE 80 MCG/ML (10ML) SYRINGE FOR IV PUSH (FOR BLOOD PRESSURE SUPPORT)
PREFILLED_SYRINGE | INTRAVENOUS | Status: AC
  Filled 2022-01-28: qty 10

## 2022-01-28 MED ORDER — BUPIVACAINE HCL (PF) 0.5 % IJ SOLN
INTRAMUSCULAR | Status: AC
  Filled 2022-01-28: qty 30

## 2022-01-28 MED ORDER — BUPIVACAINE HCL (PF) 0.5 % IJ SOLN
INTRAMUSCULAR | Status: DC | PRN
  Administered 2022-01-28: 10 mL

## 2022-01-28 MED ORDER — FENTANYL CITRATE (PF) 100 MCG/2ML IJ SOLN
INTRAMUSCULAR | Status: DC | PRN
  Administered 2022-01-28: 50 ug via INTRAVENOUS

## 2022-01-28 MED ORDER — PROPOFOL 10 MG/ML IV BOLUS
INTRAVENOUS | Status: AC
  Filled 2022-01-28: qty 20

## 2022-01-28 MED ORDER — HYDROMORPHONE HCL 1 MG/ML IJ SOLN: 0.2500 mg | INTRAMUSCULAR | Status: DC | PRN

## 2022-01-28 MED ORDER — LACTATED RINGERS IV SOLN: INTRAVENOUS | Status: DC | PRN

## 2022-01-28 MED ORDER — PROSOURCE PLUS PO LIQD: 30.0000 mL | Freq: Three times a day (TID) | ORAL | 2 refills | Status: DC

## 2022-01-28 MED ORDER — MAGNESIUM SULFATE 2 GM/50ML IV SOLN
2.0000 g | Freq: Once | INTRAVENOUS | Status: AC
  Administered 2022-01-28: 2 g via INTRAVENOUS
  Filled 2022-01-28: qty 50

## 2022-01-28 MED ORDER — FENTANYL CITRATE (PF) 100 MCG/2ML IJ SOLN
INTRAMUSCULAR | Status: AC
  Filled 2022-01-28: qty 2

## 2022-01-28 MED ORDER — DOXYCYCLINE HYCLATE 100 MG PO TABS
100.0000 mg | ORAL_TABLET | Freq: Two times a day (BID) | ORAL | Status: DC
  Administered 2022-01-28 – 2022-01-29 (×2): 100 mg via ORAL
  Filled 2022-01-28 (×3): qty 1

## 2022-01-28 MED ORDER — ONDANSETRON HCL 4 MG/2ML IJ SOLN
INTRAMUSCULAR | Status: DC | PRN
  Administered 2022-01-28: 4 mg via INTRAVENOUS

## 2022-01-28 MED ORDER — ONDANSETRON HCL 4 MG/2ML IJ SOLN
INTRAMUSCULAR | Status: AC
  Filled 2022-01-28: qty 2

## 2022-01-28 MED ORDER — 0.9 % SODIUM CHLORIDE (POUR BTL) OPTIME
TOPICAL | Status: DC | PRN
  Administered 2022-01-28: 1000 mL

## 2022-01-28 MED ORDER — PROPOFOL 500 MG/50ML IV EMUL
INTRAVENOUS | Status: AC
  Filled 2022-01-28: qty 50

## 2022-01-28 MED ORDER — PROPOFOL 10 MG/ML IV BOLUS
INTRAVENOUS | Status: DC | PRN
  Administered 2022-01-28: 150 mg via INTRAVENOUS

## 2022-01-28 MED ORDER — LIDOCAINE HCL (CARDIAC) PF 100 MG/5ML IV SOSY
PREFILLED_SYRINGE | INTRAVENOUS | Status: DC | PRN
  Administered 2022-01-28: 60 mg via INTRAVENOUS

## 2022-01-28 MED ORDER — LIDOCAINE HCL (PF) 2 % IJ SOLN
INTRAMUSCULAR | Status: AC
  Filled 2022-01-28: qty 5

## 2022-01-28 MED ORDER — DEXAMETHASONE SODIUM PHOSPHATE 10 MG/ML IJ SOLN
INTRAMUSCULAR | Status: AC
  Filled 2022-01-28: qty 1

## 2022-01-28 MED ORDER — DEXAMETHASONE SODIUM PHOSPHATE 10 MG/ML IJ SOLN
INTRAMUSCULAR | Status: DC | PRN
  Administered 2022-01-28: 5 mg via INTRAVENOUS

## 2022-01-28 MED ORDER — ORAL CARE MOUTH RINSE: 15.0000 mL | OROMUCOSAL | Status: DC | PRN

## 2022-01-28 SURGICAL SUPPLY — 44 items
BAG COUNTER SPONGE SURGICOUNT (BAG) ×1 IMPLANT
BENZOIN TINCTURE PRP APPL 2/3 (GAUZE/BANDAGES/DRESSINGS) ×1 IMPLANT
BLADE AVERAGE 25X9 (BLADE) IMPLANT
BLADE MIC 41X13 (BLADE) ×1 IMPLANT
BLADE SURG 15 STRL LF DISP TIS (BLADE) IMPLANT
BLADE SURG 15 STRL SS (BLADE)
BLADE SURG SZ10 CARB STEEL (BLADE) IMPLANT
BNDG ELASTIC 4X5.8 VLCR STR LF (GAUZE/BANDAGES/DRESSINGS) IMPLANT
BNDG GAUZE DERMACEA FLUFF 4 (GAUZE/BANDAGES/DRESSINGS) ×1 IMPLANT
CHLORAPREP W/TINT 26 (MISCELLANEOUS) IMPLANT
COVER SURGICAL LIGHT HANDLE (MISCELLANEOUS) ×1 IMPLANT
CUFF TOURN SGL QUICK 18 (TOURNIQUET CUFF) IMPLANT
CUFF TOURN SGL QUICK 18X4 (TOURNIQUET CUFF) ×1 IMPLANT
CUFF TOURN SGL QUICK 24 (TOURNIQUET CUFF)
CUFF TRNQT CYL 24X4X16.5-23 (TOURNIQUET CUFF) IMPLANT
ELECT REM PT RETURN 15FT ADLT (MISCELLANEOUS) IMPLANT
GAUZE PACKING IODOFORM 1/4X15 (PACKING) ×1 IMPLANT
GAUZE PAD ABD 8X10 STRL (GAUZE/BANDAGES/DRESSINGS) ×1 IMPLANT
GAUZE SPONGE 4X4 12PLY STRL (GAUZE/BANDAGES/DRESSINGS) ×1 IMPLANT
GAUZE XEROFORM 1X8 LF (GAUZE/BANDAGES/DRESSINGS) ×1 IMPLANT
GLOVE BIO SURGEON STRL SZ7 (GLOVE) ×1 IMPLANT
GLOVE BIOGEL PI IND STRL 7.5 (GLOVE) ×1 IMPLANT
GOWN STRL REUS W/ TWL LRG LVL3 (GOWN DISPOSABLE) ×1 IMPLANT
GOWN STRL REUS W/TWL LRG LVL3 (GOWN DISPOSABLE) ×1
KIT BASIN OR (CUSTOM PROCEDURE TRAY) ×1 IMPLANT
KIT TURNOVER KIT B (KITS) ×1 IMPLANT
NDL PRECISIONGLIDE 27X1.5 (NEEDLE) ×1 IMPLANT
NEEDLE PRECISIONGLIDE 27X1.5 (NEEDLE) ×1 IMPLANT
NS IRRIG 1000ML POUR BTL (IV SOLUTION) ×1 IMPLANT
PACK ORTHO EXTREMITY (CUSTOM PROCEDURE TRAY) ×1 IMPLANT
PAD ARMBOARD 7.5X6 YLW CONV (MISCELLANEOUS) ×2 IMPLANT
PAD CAST 4YDX4 CTTN HI CHSV (CAST SUPPLIES) IMPLANT
PADDING CAST COTTON 4X4 STRL (CAST SUPPLIES)
SOL PREP POV-IOD 4OZ 10% (MISCELLANEOUS) ×1 IMPLANT
STAPLER VISISTAT 35W (STAPLE) IMPLANT
STRIP CLOSURE SKIN 1/2X4 (GAUZE/BANDAGES/DRESSINGS) ×1 IMPLANT
SUT PROLENE 3 0 PS 2 (SUTURE) IMPLANT
SUT PROLENE 4 0 PS 2 18 (SUTURE) IMPLANT
SUT VIC AB 3-0 PS2 18 (SUTURE) IMPLANT
SUT VICRYL 4-0 PS2 18IN ABS (SUTURE) ×1 IMPLANT
SYR CONTROL 10ML LL (SYRINGE) ×2 IMPLANT
TOWEL GREEN STERILE (TOWEL DISPOSABLE) ×1 IMPLANT
TUBE CONNECTING 12X1/4 (SUCTIONS) ×1 IMPLANT
YANKAUER SUCT BULB TIP NO VENT (SUCTIONS) IMPLANT

## 2022-01-28 NOTE — Interval H&P Note (Signed)
History and Physical Interval Note:  01/28/2022 8:44 AM  Justin [name]  has presented today for surgery, with the diagnosis of osteomyelitis right foot.  The various methods of treatment have been discussed with the patient and family. After consideration of risks, benefits and other options for treatment, the patient has consented to  Procedure(s) with comments: TRANSMETATARSAL AMPUTATION (Right) - R foot Transmetatarsal amputation, possible tendo achilles lengthening as a surgical intervention.  The patient's history has been reviewed, patient examined, no change in status, stable for surgery.  I have reviewed the patient's chart and labs.  Questions were answered to the patient's satisfaction.     [name]

## 2022-01-28 NOTE — Progress Notes (Signed)
Orthopedic Tech Progress Note Patient Details:  Justin [name] [birth year] [id number]  Ortho Devices Type of Ortho Device: CAM walker Ortho Device/Splint Location: right Ortho Device/Splint Interventions: Application   Post Interventions Patient Tolerated: Well Instructions Provided: Care of device, Adjustment of device  [place] Pink 01/28/2022, 11:22 AM

## 2022-01-28 NOTE — TOC Progression Note (Addendum)
Transition of Care [facility]) - Progression Note    Patient Details  Name: Justin [name] MRN: [id number] Date of Birth: 06-12-1934  Transition of Care [facility]) CM/SW Contact  Henrietta Dine, RN Phone Number: 01/28/2022, 11:22 AM  Clinical Narrative:    Notified by Dr Marlyce Huge pt ready for d/c; pt is LTC resident at [facility]; spoke with Lorenza Chick at facility; she says the pt's dtr does not want the pt to return until Monday; will attempt to contact pt's dtr.  - 1142 - spoke with Dr Marlyce Huge regarding d/c; he confirms the pt's dtr doesn't want him to return until 10/23; plan on d/c to [facility] on 10/23; Anderson Malta, [place] notified.  - 1203 - confirmed with Irine Seal at [facility] the pt will return to on 10/23 to RM # [id number], call report # [phone]; pt and his dtr [name] notified; will pass onto oncoming TOC.    Expected Discharge Plan: Long Term Nursing Home Barriers to Discharge: Continued Medical Work up  Expected Discharge Plan and Services Expected Discharge Plan: [place] In-house Referral: Clinical Social Work     Living arrangements for the past 2 months: [place]                 DME Arranged: N/A DME Agency: NA                   Social Determinants of Health (SDOH) Interventions    Readmission Risk Interventions    01/26/2022   11:24 AM  Readmission Risk Prevention Plan  Transportation Screening Complete  PCP or Specialist Appt within 3-5 Days Complete  HRI or [place] Complete  Social Work Consult for [place] Planning/Counseling Complete  Palliative Care Screening Not Applicable  Medication Review [employer]) Complete

## 2022-01-28 NOTE — Anesthesia Preprocedure Evaluation (Signed)
Anesthesia Evaluation  Patient identified by MRN, date of birth, ID band Patient awake    Reviewed: Allergy & Precautions, NPO status , Patient's Chart, lab work & pertinent test results  Airway Mallampati: II       Dental   Pulmonary former smoker,    breath sounds clear to auscultation       Cardiovascular hypertension, + CAD and + Peripheral Vascular Disease   Rhythm:Regular Rate:Normal     Neuro/Psych  Headaches, TIA Neuromuscular disease CVA    GI/Hepatic Neg liver ROS, GERD  ,  Endo/Other  diabetes  Renal/GU Renal disease     Musculoskeletal  (+) Arthritis ,   Abdominal   Peds  Hematology   Anesthesia Other Findings   Reproductive/Obstetrics                             Anesthesia Physical Anesthesia Plan  ASA: 3  Anesthesia Plan:    Post-op Pain Management:    Induction: Intravenous  PONV Risk Score and Plan: Ondansetron, Dexamethasone and Midazolam  Airway Management Planned: LMA  Additional Equipment:   Intra-op Plan:   Post-operative Plan: Extubation in OR  Informed Consent: I have reviewed the patients History and Physical, chart, labs and discussed the procedure including the risks, benefits and alternatives for the proposed anesthesia with the patient or authorized representative who has indicated his/her understanding and acceptance.       Plan Discussed with: CRNA and Anesthesiologist  Anesthesia Plan Comments:         Anesthesia Quick Evaluation

## 2022-01-28 NOTE — Progress Notes (Signed)
PROGRESS NOTE   Justin [name]  TMH:[id number] DOB: May 27, 1934 DOA: 01/25/2022 PCP: [facility] [place]   Date of Service: the patient was seen and examined on 01/28/2022  Brief Narrative:  87 year old male who is a resident of [facility] skilled nursing with past medical history of multiple prior strokes including frontal-parietal and bilateral subcortical strokes 05/2021 with residual right-sided hemiparesis, coronary artery disease, gastroesophageal reflux disease, diabetes mellitus type 2, hyperlipidemia, hypertension and chronic pancreatitis who presents to [facility] emergency department from the wound care clinic for evaluation of nonhealing wound of recent right second toe amputation.  Patient had been complaining of ongoing pain.  In the emergency department case was discussed with podiatry who was concerned for possible osteomyelitis of the third toe and need for ongoing surgery.  Patient was evaluate by podiatry in the emergency department.  The day recommended to abstain from antibiotics unless the patient becomes septic.  Plans are for patient to undergo transmetatarsal amputation with possible tendo Achilles lengthening on Sunday 10/22  in the morning with Dr. Posey Pronto.  Patient was admitted to the hospital service.  Patient was placed on intravenous antibiotics.   Assessment and Plan: * Osteomyelitis of second toe of right foot [facility]) Patient underwent transmetatarsal amputation by Dr. Posey Pronto successfully morning of 10/22 Oral doxycycline initiated for 10-day course per recommendations of Dr. Posey Pronto Monitor postoperatively, likely discharge in the morning. As needed analgesics for associated pain Monitoring for evidence of progressive infection  Chronic kidney disease, stage 3b ([name]) Strict intake and output monitoring Creatinine near baseline Minimizing nephrotoxic agents as much as possible Serial chemistries to monitor renal function and electrolytes   Type  2 diabetes mellitus with stage 3b chronic kidney disease, with long-term current use of insulin ([place]) Patient been placed on Accu-Cheks before every meal and nightly with sliding scale insulin Patient had an episode of hypoglycemia this morning which quickly resolved with administration of oral glucose, food Holding home regimen of hypoglycemics Heme globin A1c 12/2021 8.9%. Diabetic Diet   Chronic pancreatitis ([place]) Continue home regimen of pancreatic supplements  Coronary artery disease Patient is currently chest pain free Aspirin held until operative intervention Continue home regimen of statin therapy    Essential hypertension Documented history of hypertension however patient is mostly normotensive without any antihypertensive therapy here As needed intravenous angiotensins for markedly elevated blood pressure  Mixed diabetic hyperlipidemia associated with type 2 diabetes mellitus ([place]) Continuing home regimen of lipid lowering therapy.    GERD without esophagitis Continuing home regimen of daily PPI therapy.     Subjective:  Patient complaining of mild right foot pain post operatively.    Physical Exam:  Vitals:   01/28/22 1015 01/28/22 1030 01/28/22 1338 01/28/22 2005  BP: 125/63 128/63 132/74   Pulse: 77 78 81   Resp: '15 14 18   '$ Temp:  (!) 97.5 F (36.4 C) 98.4 F (36.9 C)   TempSrc:  Oral Oral   SpO2: 100% 100% 100% 98%  Weight:      Height:        Constitutional: Awake alert and oriented x3, no associated distress.   Skin: Right foot dressing is clean dry and intact  eyes: Pupils are equally reactive to light.  No evidence of scleral icterus or conjunctival pallor.  ENMT: Moist mucous membranes noted.  Posterior pharynx clear of any exudate or lesions.   Respiratory: clear to auscultation bilaterally, no wheezing, no crackles. Normal respiratory effort. No accessory muscle use.  Cardiovascular: Regular  rate and rhythm, no murmurs / rubs / gallops. No  extremity edema. 2+ pedal pulses. No carotid bruits.  Abdomen: Abdomen is soft and nontender.  No evidence of intra-abdominal masses.  Positive bowel sounds noted in all quadrants.   Musculoskeletal: No joint deformity upper and lower extremities. Good ROM, no contractures. Normal muscle tone.    Data Reviewed:  I have personally reviewed and interpreted labs, imaging.  Significant findings are   CBC: Recent Labs  Lab 01/25/22 1245 01/26/22 0328 01/27/22 0338 01/28/22 0339  WBC 8.8 9.1 9.5 10.4  NEUTROABS 4.5  --  4.1 5.7  HGB 9.1* 8.1* 8.1* 8.2*  HCT 28.2* 24.4* 25.2* 25.6*  MCV 92.5 90.7 93.7 93.8  PLT 299 263 249 846   Basic Metabolic Panel: Recent Labs  Lab 01/25/22 1245 01/26/22 0328 01/27/22 0338 01/28/22 0339  NA 136 136 134* 134*  K 3.5 3.1* 3.7 3.5  CL 104 107 106 106  CO2 '25 24 23 22  '$ GLUCOSE 116* 73 139* 168*  BUN 26* 24* 26* 26*  CREATININE 1.66* 1.39* 1.35* 1.39*  CALCIUM 8.5* 8.1* 8.1* 8.1*  MG  --   --  1.8 1.6*   GFR: Estimated Creatinine Clearance: 38.7 mL/min (A) (by C-G formula based on SCr of 1.39 mg/dL (H)). Liver Function Tests: Recent Labs  Lab 01/26/22 0328 01/27/22 0338 01/28/22 0339  AST '17 15 17  '$ ALT '11 10 8  '$ ALKPHOS 74 69 71  BILITOT 0.6 0.6 0.6  PROT 7.1 7.0 7.0  ALBUMIN 2.6* 2.5* 2.6*    Coagulation Profile: Recent Labs  Lab 01/25/22 1230 01/28/22 0339  INR 1.2 1.2     Code Status:  DNR   Family Communication: Son and daughter at the bedside who have both been updated on plan of care.   Severity of Illness:  The appropriate patient status for this patient is INPATIENT. Inpatient status is judged to be reasonable and necessary in order to provide the required intensity of service to ensure the patient's safety. The patient's presenting symptoms, physical exam findings, and initial radiographic and laboratory data in the context of their chronic comorbidities is felt to place them at high risk for further clinical  deterioration. Furthermore, it is not anticipated that the patient will be medically stable for discharge from the hospital within 2 midnights of admission.   * I certify that at the point of admission it is my clinical judgment that the patient will require inpatient hospital care spanning beyond 2 midnights from the point of admission due to high intensity of service, high risk for further deterioration and high frequency of surveillance required.*  Time spent:  32 minutes  Author:  Vernelle Emerald MD  01/28/2022 8:24 PM

## 2022-01-28 NOTE — Op Note (Signed)
Surgeon: Surgeon(s): [name], DPM  Assistants: None Pre-operative diagnosis: osteomyelitis right foot  Post-operative diagnosis: same Procedure: Procedure(s) (LRB): TRANSMETATARSAL AMPUTATION (Right) with Achilles tendon lengthening percutaneous Pathology: Right foot pathology Pertinent Intra-op findings: Osteomyelitis of the second and third metatarsal head Anesthesia: Choice  Hemostasis: 30 minutes tourniquet EBL: 100 cc Materials: 3-0 Prolene and skin staple Injectables: 10 cc of half percent Marcaine plain Complications: None  Indications for surgery: A 87 y.o. male presents with right foot osteomyelitis of second and third metatarsal head. Patient has failed all conservative therapy including but not limited to local wound care and IV antibiotics. He wishes to have surgical correction of the foot/deformity. It was determined that patient would benefit from right transmetatarsal amputation and Achilles tendon lengthening percutaneous. Informed surgical risk consent was reviewed and read aloud to the patient.  I reviewed the films.  I have discussed my findings with the patient in great detail.  I have discussed all risks including but not limited to infection, stiffness, scarring, limp, disability, deformity, damage to blood vessels and nerves, numbness, poor healing, need for braces, arthritis, chronic pain, amputation, death.  All benefits and realistic expectations discussed in great detail.  I have made no promises as to the outcome.  I have provided realistic expectations.  I have offered the patient a 2nd opinion, which they have declined and assured me they preferred to proceed despite the risks   Procedure in detail: The patient was both verbally and visually identified by myself, the nursing staff, and anesthesia staff in the preoperative holding area. They were then transferred to the operating room and placed on the operative table in supine position.  After being  properly identified the patient was taken to the operating room and after an adequate level of general anesthesia was obtained, the patient was positioned on the operating room table in the prone position.  At this point, the operative lower extremity was prepped and draped in the usual sterile fashion.  The leg was elevated but not exsanguinated and tourniquet inflated to 250 mmHg.   We subsequently made a distal stab wound in the distal aspect of the Achilles and turned the knife 90 degrees and made a medial stepcut.  This was repeated in the central portion of the tendon with a lateral stepcut and then again more proximally with another medial subcut.  At this point, pressure was applied to the foot to bring the foot up into a neutral position, thus lengthening the Achilles.  At this point we felt we had adequate position.  Our wounds were closed with 3-0 Prolene sutures.  A sterile dressing was applied followed by a well-padded cam boot the patient was returned to the supine position, awakened and authorized for transport to the post anesthesia recovery room having tolerated the procedure well.    A fish mouth incision was made circumfrenting the forefoot and carried down to the metatarsal shafts using a #10 surgical blade.  The metatarsal shafts were freed from their periosteal tissue.  A sagittal bone saw was utilized for all the metatarsal osteotomies near their bases at the proximal 1/3. The distal part of the forefoot was then surgically removed and passed from the operative field and sent to pathology for biopsy and to microbiology for gram stain and anaerobic and aerobic culture and sensitivity.     The more proximal remaining metatarsal bones were check to have viable, hard and good bleeding, bone.  A piece of the more proximal bone was  collected and sent to microbiology for gram stain and anaerobic and aerobic culture and sensitivity.  The remaining bone was smoothed from its sharp edges with a bone  rasp.    The amputation site was copiously irrigated with pulse lavage sterile saline (total 4 liters).    The subcutaneous tissues were then re-approximated utilizing 3-0 Vicryl. The skin was then re-approximated utilizing 2-0 nylon using a simple running interlock suture technique.     Upon completion of the above procedure, the surgical site was dressed with the rest ofThe surgical site was covered with compressive dressing consisting of 4 x 4 gauze, Kerlix bandages.   At the conclusion of the procedure the patient was awoken from anesthesia and found to have tolerated the procedure well any complications. There were transferred to PACU with vital signs stable and vascular status intact.  Boneta Lucks, DPM

## 2022-01-28 NOTE — Transfer of Care (Signed)
Immediate Anesthesia Transfer of Care Note  Patient: Justin [name]  Procedure(s) Performed: TRANSMETATARSAL AMPUTATION; ACHILLES TENDON LENGTHENING (Right: Foot)  Patient Location: PACU  Anesthesia Type:General  Level of Consciousness: awake, alert , oriented, drowsy and patient cooperative  Airway & Oxygen Therapy: Patient Spontanous Breathing and Patient connected to face mask oxygen  Post-op Assessment: Report given to RN and Post -op Vital signs reviewed and stable  Post vital signs: Reviewed and stable  Last Vitals:  Vitals Value Taken Time  BP 124/67 01/28/22 0939  Temp    Pulse 81 01/28/22 0941  Resp 17 01/28/22 0941  SpO2 100 % 01/28/22 0941  Vitals shown include unvalidated device data.  Last Pain:  Vitals:   01/28/22 0430  TempSrc: Oral  PainSc:          Complications: No notable events documented.

## 2022-01-28 NOTE — Anesthesia Procedure Notes (Signed)
Procedure Name: LMA Insertion Date/Time: 01/28/2022 8:58 AM  Performed by: Raenette Rover, CRNAPre-anesthesia Checklist: Patient identified, Emergency Drugs available, Suction available and Patient being monitored Patient Re-evaluated:Patient Re-evaluated prior to induction Oxygen Delivery Method: Circle system utilized Preoxygenation: Pre-oxygenation with 100% oxygen Induction Type: IV induction LMA: LMA inserted LMA Size: 4.0 Tube type: Oral Number of attempts: 1 Placement Confirmation: positive ETCO2 and breath sounds checked- equal and bilateral Tube secured with: Tape Dental Injury: Teeth and Oropharynx as per pre-operative assessment

## 2022-01-28 NOTE — Anesthesia Postprocedure Evaluation (Signed)
Anesthesia Post Note  Patient: Justin [name]  Procedure(s) Performed: TRANSMETATARSAL AMPUTATION; ACHILLES TENDON LENGTHENING (Right: Foot)     Patient location during evaluation: Endoscopy Anesthesia Type: MAC Level of consciousness: awake Pain management: pain level controlled Vital Signs Assessment: post-procedure vital signs reviewed and stable Respiratory status: spontaneous breathing Cardiovascular status: stable Postop Assessment: no apparent nausea or vomiting Anesthetic complications: no   No notable events documented.  Last Vitals:  Vitals:   01/28/22 1000 01/28/22 1015  BP: 121/69 125/63  Pulse: 78 77  Resp: 14 15  Temp: 36.7 C   SpO2: 100% 100%    Last Pain:  Vitals:   01/28/22 1015  TempSrc:   PainSc: 0-No pain                 Sunny Aguon

## 2022-01-29 ENCOUNTER — Encounter (HOSPITAL_COMMUNITY): Payer: Self-pay | Admitting: Podiatry

## 2022-01-29 DIAGNOSIS — I10 Essential (primary) hypertension: Secondary | ICD-10-CM | POA: Diagnosis not present

## 2022-01-29 DIAGNOSIS — M869 Osteomyelitis, unspecified: Secondary | ICD-10-CM | POA: Diagnosis not present

## 2022-01-29 DIAGNOSIS — N1832 Chronic kidney disease, stage 3b: Secondary | ICD-10-CM | POA: Diagnosis not present

## 2022-01-29 DIAGNOSIS — K861 Other chronic pancreatitis: Secondary | ICD-10-CM | POA: Diagnosis not present

## 2022-01-29 LAB — GLUCOSE, CAPILLARY
Glucose-Capillary: 269 mg/dL — ABNORMAL HIGH (ref 70–99)
Glucose-Capillary: 260 mg/dL — ABNORMAL HIGH (ref 70–99)

## 2022-01-29 MED ORDER — INSULIN ASPART 100 UNIT/ML IJ SOLN
2.0000 [IU] | Freq: Three times a day (TID) | INTRAMUSCULAR | Status: DC
  Administered 2022-01-29: 2 [IU] via SUBCUTANEOUS

## 2022-01-29 MED ORDER — DOXYCYCLINE HYCLATE 100 MG PO TABS: 100.0000 mg | ORAL_TABLET | Freq: Two times a day (BID) | ORAL | 0 refills | Status: AC

## 2022-01-29 MED ORDER — INSULIN ASPART 100 UNIT/ML IJ SOLN: 2.0000 [IU] | Freq: Three times a day (TID) | INTRAMUSCULAR | 11 refills | Status: DC

## 2022-01-29 NOTE — Plan of Care (Signed)
  Problem: Clinical Measurements: Goal: Will remain free from infection Outcome: Progressing Goal: Respiratory complications will improve Outcome: Progressing   Problem: Coping: Goal: Ability to adjust to condition or change in health will improve Outcome: Progressing

## 2022-01-29 NOTE — NC FL2 (Signed)
[place] LEVEL OF CARE SCREENING TOOL     IDENTIFICATION  Patient Name: Justin [name] Birthdate: [birth year] Sex: male Admission Date (Current Location): 01/25/2022  [place] and [place] Number:  Kathleen Argue [id number] [place] and Address:  [facility],  [address] [place], [place]      Provider Number: [id number]  Attending Physician Name and Address:  Vernelle Emerald, MD  Relative Name and Phone Number:       Current Level of Care: Hospital Recommended Level of Care: [place] Prior Approval Number:    Date Approved/Denied:   PASRR Number: [id number]  Discharge Plan: SNF    Current Diagnoses: Patient Active Problem List   Diagnosis Date Noted   GERD without esophagitis 01/26/2022   Osteomyelitis of second toe of right foot ([place]) 01/25/2022   Altered mental status    PAD (peripheral artery disease) ([place]) 12/09/2021   Balanitis 12/09/2021   Pressure injury of skin 11/25/2021   Diabetic foot ([place]) 11/16/2021   Diabetic foot infection ([place]) 11/15/2021   Intertrigo 11/15/2021   History of CVA (cerebrovascular accident) 05/21/2021   Chronic kidney disease, stage 3b ([place]) 05/21/2021   Mixed diabetic hyperlipidemia associated with type 2 diabetes mellitus ([place]) 05/21/2021   Polyneuropathy due to type 2 diabetes mellitus ([place]) 05/21/2021   Leukocytosis 05/21/2021   Type 2 diabetes mellitus with stage 3b chronic kidney disease, with long-term current use of insulin ([place]) 09/29/2015   Arthritis 09/06/2015   Diabetic peripheral neuropathy ([place]) 08/17/2015   OA (osteoarthritis) of knee 08/17/2015   Primary osteoarthritis of both knees 08/11/2015   Primary osteoarthritis of left hip 08/11/2015   Hammer toe, acquired 09/30/2013   Personal history of colonic adenomas 04/15/2013   Chronic pancreatitis ([place]) 02/26/2013   Anemia of chronic disease 02/26/2013   Goals of care, counseling/discussion 02/26/2013   Essential  hypertension 12/14/2012   Testosterone deficiency 10/22/2011   Headache above the eye region 02/22/2011   Coronary artery disease 02/22/2011    Orientation RESPIRATION BLADDER Height & Weight     Self, Situation, Place  Normal Incontinent Weight: 155 lb 3.3 oz (70.4 kg) Height:  '6\' 2"'$  (188 cm)  BEHAVIORAL SYMPTOMS/MOOD NEUROLOGICAL BOWEL NUTRITION STATUS      Incontinent Diet  AMBULATORY STATUS COMMUNICATION OF NEEDS Skin   Extensive Assist Verbally Surgical wounds                       Personal Care Assistance Level of Assistance  Bathing, Dressing Bathing Assistance: Limited assistance   Dressing Assistance: Limited assistance     Functional Limitations Info  Hearing   Hearing Info: Impaired      SPECIAL CARE FACTORS FREQUENCY                       Contractures Contractures Info: Not present    Additional Factors Info  Code Status, Allergies Code Status Info: DNR Allergies Info: NKDA           Current Medications (01/29/2022):  This is the current hospital active medication list Current Facility-Administered Medications  Medication Dose Route Frequency Provider Last Rate Last Admin   (feeding supplement) PROSource Plus liquid 30 mL  30 mL Oral TID BM [name], DPM   30 mL at 01/29/22 0917   acetaminophen (TYLENOL) tablet 650 mg  650 mg Oral Q6H PRN [name], DPM   650 mg at 01/28/22 2032   Or  acetaminophen (TYLENOL) suppository 650 mg  650 mg Rectal Q6H PRN [name], DPM       atorvastatin (LIPITOR) tablet 40 mg  40 mg Oral QHS [name], DPM   40 mg at 01/28/22 2032   dextromethorphan-guaiFENesin ([place] DM) 30-600 MG per 12 hr tablet 1 tablet  1 tablet Oral BID PRN [name], DPM       doxycycline (VIBRA-TABS) tablet 100 mg  100 mg Oral BID Vernelle Emerald, MD   100 mg at 01/28/22 2214   ferrous gluconate (FERGON) tablet 324 mg  324 mg Oral Once per day on Mon Tue Wed Thu Fri [name], DPM   324 mg at  01/26/22 0944   insulin aspart (novoLOG) injection 0-9 Units  0-9 Units Subcutaneous TID WC [name], DPM   5 Units at 01/29/22 2202   insulin aspart (novoLOG) injection 2 Units  2 Units Subcutaneous TID WC Vernelle Emerald, MD   2 Units at 01/29/22 5427   insulin glargine-yfgn (SEMGLEE) injection 11 Units  11 Units Subcutaneous Daily [name], DPM   11 Units at 01/29/22 0623   ipratropium-albuterol (DUONEB) 0.5-2.5 (3) MG/3ML nebulizer solution 3 mL  3 mL Nebulization Q4H PRN [name], DPM       isosorbide mononitrate (ISMO) tablet 10 mg  10 mg Oral QHS [name], DPM   10 mg at 01/28/22 2032   lipase/protease/amylase (CREON) capsule 24,000 Units  24,000 Units Oral TID WC [name], DPM   24,000 Units at 01/29/22 0920   loratadine (CLARITIN) tablet 10 mg  10 mg Oral Daily [name], DPM   10 mg at 01/29/22 0920   mometasone-formoterol (DULERA) 200-5 MCG/ACT inhaler 2 puff  2 puff Inhalation BID [name], DPM   2 puff at 01/29/22 0912   ondansetron (ZOFRAN) tablet 4 mg  4 mg Oral Q6H PRN [name], DPM       Or   ondansetron (ZOFRAN) injection 4 mg  4 mg Intravenous Q6H PRN [name], DPM       Oral care mouth rinse  15 mL Mouth Rinse PRN Shalhoub, Sherryll Burger, MD       oxyCODONE (Oxy IR/ROXICODONE) immediate release tablet 2.5 mg  2.5 mg Oral Q3H PRN [name], DPM   2.5 mg at 01/28/22 1736   pantoprazole (PROTONIX) EC tablet 40 mg  40 mg Oral Daily [name], DPM   40 mg at 01/29/22 0919   senna-docusate (Senokot-S) tablet 1 tablet  1 tablet Oral QHS PRN [name], DPM         Discharge Medications: Please see discharge summary for a list of discharge medications.  Relevant Imaging Results:  Relevant Lab Results:   Additional Information SS#: [id number]  Lennart Pall, LCSW

## 2022-01-29 NOTE — Progress Notes (Signed)
Facility called, report given, all questions answered.  Pt not in acute distress, discharged to SNF with belongings via [place].

## 2022-01-29 NOTE — Progress Notes (Signed)
PT Cancellation Note  Patient Details Name: Justin [name] MRN: [id number] DOB: May 14, 1934   Cancelled Treatment:    Reason Eval/Treat Not Completed: PT screened, no needs identified, will sign off. Pt is LTC SNF resident and is set to d/c back to SNF on today. Will defer PT eval to facility.    [facility]  Office: [phone]

## 2022-01-29 NOTE — TOC Transition Note (Signed)
Transition of Care [facility]) - CM/SW Discharge Note   Patient Details  Name: Justin [name] MRN: [id number] Date of Birth: 04/29/1934  [facility] of [facility] CM/SW Contact:  Lennart Pall, LCSW Phone Number: 01/29/2022, 10:47 AM   Clinical Narrative:    Pt medically cleared today to return to [facility].  Pt and daughter aware and agreeable.  PTAR called at 10:45am.  RN to call report to [phone].  No further TOC needs.   Final next level of care: Long Term Nursing Home Barriers to Discharge: Barriers Resolved   Patient Goals and CMS Choice Patient states their goals for this hospitalization and ongoing recovery are:: return to LTC      Discharge Placement   Existing PASRR number confirmed : 01/29/22          Patient [facility] bed at: [facility] Patient to be transferred to facility by: [place] Name of family member notified: daughter, [name] Patient and family notified of of transfer: 01/29/22  Discharge Plan and Services In-house Referral: Clinical Social Work              DME Arranged: N/A DME Agency: NA                  Social Determinants of Health ([place]) Interventions     Readmission Risk Interventions    01/26/2022   11:24 AM  Readmission Risk Prevention Plan  Transportation Screening Complete  PCP or Specialist Appt within 3-5 Days Complete  HRI or [place] Complete  Social Work Consult for [place] Planning/Counseling Complete  Palliative Care Screening Not Applicable  Medication Review Press photographer) Complete

## 2022-01-29 NOTE — Plan of Care (Signed)
  Problem: Safety: Goal: Ability to remain free from injury will improve Outcome: Progressing   Problem: Coping: Goal: Ability to adjust to condition or change in health will improve Outcome: Progressing   Problem: Pain Managment: Goal: General experience of comfort will improve Outcome: Progressing

## 2022-01-29 NOTE — Discharge Summary (Signed)
Physician Discharge Summary   Patient: Justin [name] MRN: [id number] DOB: [birth year]  Admit date:     01/25/2022  Discharge date: 01/29/22  Discharge Physician: Vernelle Emerald   PCP: [facility]   Recommendations at discharge:    Patient to receive Lantus and Novolog as instructed Patient's right foot dressing to remain in place until Podiatry follow up. Patient to have appointment made with Dr. Boneta Lucks with Podiatry in one week. Patient to folllow up with facility provider or PCP within 1 week.  Discharge Diagnoses: Principal Problem:   Osteomyelitis of second toe of right foot ([place]) Active Problems:   Chronic kidney disease, stage 3b ([name])   Type 2 diabetes mellitus with stage 3b chronic kidney disease, with long-term current use of insulin ([name])   Chronic pancreatitis ([name])   Coronary artery disease   Essential hypertension   Mixed diabetic hyperlipidemia associated with type 2 diabetes mellitus ([place])   GERD without esophagitis  Resolved Problems:   * No resolved hospital problems. *   Hospital Course: 87 year old male who is a resident of [facility] with past medical history of multiple prior strokes including frontal-parietal and bilateral subcortical strokes 05/2021 with residual right-sided hemiparesis, coronary artery disease, gastroesophageal reflux disease, diabetes mellitus type 2, hyperlipidemia, hypertension and chronic pancreatitis who presents to [facility] emergency department from the wound care clinic for evaluation of nonhealing wound of recent right second toe amputation.  Patient had been complaining of ongoing pain.  In the emergency department case was discussed with podiatry who was concerned for possible osteomyelitis of the third toe and need for ongoing surgery.  Patient was evaluate by podiatry in the emergency department.  The day recommended to abstain from antibiotics unless the patient becomes septic.   Plans were for patient to undergo transmetatarsal amputation with possible tendo Achilles lengthening with Dr. Posey Pronto.  Patient was admitted to the hospital service.  Patient was placed on intravenous antibiotics.  Patient's blood sugars were noted to be somewhat brittle throughout the hospitalization, initially hypoglycemic but as the hospitalization progressed he became hyperglycemic.  Insulin regimen was titrated somewhat for patient to receive basal bolus insulin therapy  Transmetatarsal amputation of the right foot with Achilles tendon lengthening was performed on 45/62 without complication.  Patient was monitored overnight without issue and discharged back to skilled nursing facility the morning of 10/23.  Patient is being discharged in improved and stable condition.  Patient is to follow-up with his podiatrist Dr. Posey Pronto in 1 week and is to keep the dressing in place until that time.    Pain control - [employer] Controlled Substance Reporting System database was reviewed. and patient was instructed, not to drive, operate heavy machinery, perform activities at heights, swimming or participation in water activities or provide baby-sitting services while on Pain, Sleep and Anxiety Medications; until their outpatient Physician has advised to do so again. Also recommended to not to take more than prescribed Pain, Sleep and Anxiety Medications.   Consultants: Dr. Boneta Lucks with Podiatry Procedures performed: Transmetatarsal amputation right foot 01/28/2022.  Disposition: Skilled nursing facility Diet recommendation:  Discharge Diet Orders (From admission, onward)     Start     Ordered   01/28/22 0000  Diet Carb Modified        01/28/22 1135           Cardiac and Carb modified diet  DISCHARGE MEDICATION: Allergies as of 01/29/2022   No Known Allergies  Medication List     STOP taking these medications    insulin glargine-yfgn 100 UNIT/ML injection Commonly known as:  SEMGLEE   senna-docusate 8.6-50 MG tablet Commonly known as: Senokot-S       TAKE these medications    (feeding supplement) PROSource Plus liquid Take 30 mLs by mouth 3 (three) times daily between meals.   Accu-Chek Aviva Plus w/Device Kit Use to check blood sugar 3 times daily. Dx: E10.9   Accu-Chek Aviva Soln Use to check blood sugar 3 times daily. Dx: E10.9   accu-chek soft touch lancets Use to check blood sugar 3 times daily. Dx: E10.9   acetaminophen 325 MG tablet Commonly known as: TYLENOL Take 650 mg by mouth in the morning.   Advair HFA 230-21 MCG/ACT inhaler Generic drug: fluticasone-salmeterol Inhale 2 puffs into the lungs every 12 (twelve) hours. Rinse mouth afterwards   Alcohol Swabs Pads Use to check blood sugar 3 times daily. Dx: E10.9   Aquaphor Adv Protect Healing 41 % Oint Apply 1 application  topically See admin instructions. Apply to feet/toes once a day after mixing with Lidocaine cream   aspirin 81 MG chewable tablet Chew 81 mg by mouth daily.   atorvastatin 40 MG tablet Commonly known as: LIPITOR Take 40 mg by mouth at bedtime.   clopidogrel 75 MG tablet Commonly known as: PLAVIX Take 1 tablet (75 mg total) by mouth daily with breakfast.   Creon 24000-76000 units Cpep Generic drug: Pancrelipase (Lip-Prot-Amyl) Take 1 capsule by mouth 3 (three) times daily. With 4-6 ounces of water   doxycycline 100 MG tablet Commonly known as: VIBRA-TABS Take 1 tablet (100 mg total) by mouth 2 (two) times daily for 18 doses. First dose evening of 10/23   ferrous gluconate 324 MG tablet Commonly known as: FERGON Take 324 mg by mouth See admin instructions. Take 324 mg by mouth once a day on Mon/Tues/Wed/Thurs/Fri   glucagon 1 MG injection Inject 1 mg into the muscle once as needed. What changed: reasons to take this   glucose blood test strip Commonly known as: Accu-Chek Aviva Plus Use to check blood sugar 3 times daily. Dx: E10.9   hydrOXYzine  10 MG tablet Commonly known as: ATARAX Take 10 mg by mouth See admin instructions. Take 10 mg by mouth two times a day and hold for sedation   Insulin Pen Needle 32G X 4 MM Misc Use as directed to inject insulin tidwc   Ipratropium-Albuterol 20-100 MCG/ACT Aers respimat Commonly known as: COMBIVENT Inhale 1 puff into the lungs every 6 (six) hours as needed for wheezing. What changed: reasons to take this   isosorbide mononitrate 10 MG tablet Commonly known as: ISMO Take 10 mg by mouth at bedtime.   Lantus 100 UNIT/ML injection Generic drug: insulin glargine Inject 11 Units into the skin in the morning.   loratadine 10 MG tablet Commonly known as: CLARITIN Take 10 mg by mouth daily.   MINERIN CREME EX Apply 1 application  topically See admin instructions. Apply to both feet and legs at bedtime   NovoLOG FlexPen 100 UNIT/ML FlexPen Generic drug: insulin aspart Inject 0-8 Units into the skin See admin instructions. Inject 0-8 units into the skin before lunch and supper, per sliding scale: if is BS 70-200 give 0 units, if BS is 201-250 give 2 units, if BS is 251-300 give 4 units, if BS is 301-350 give 6 units, if BS is 351-400 give 8 units. If blood sugar is greater than 400, call  MD. What changed: Another medication with the same name was added. Make sure you understand how and when to take each.   insulin aspart 100 UNIT/ML injection Commonly known as: novoLOG Inject 2 Units into the skin 3 (three) times daily with meals. Do not give if patient is not eating What changed: You were already taking a medication with the same name, and this prescription was added. Make sure you understand how and when to take each.   omeprazole 20 MG capsule Commonly known as: PRILOSEC Take 20 mg by mouth every other day.   oxyCODONE 5 MG immediate release tablet Commonly known as: Oxy IR/ROXICODONE Take 1 tablet (5 mg total) by mouth every 6 (six) hours as needed for severe pain or breakthrough  pain.   Pro-Stat Liqd Take 30 mLs by mouth 3 (three) times daily between meals.   Spacer/Aero-Holding [employer] Use as directed with combivent. Ok to dispense any spacer that works with the inhaler being rxed to pt.   Trulicity 1.5 PP/5.0DT Sopn Generic drug: Dulaglutide Inject 1.5 mg into the skin every Friday.       ASK your doctor about these medications    Geri-Tussin 100 MG/5ML liquid Generic drug: guaiFENesin Take 10 mLs by mouth every 6 (six) hours as needed for cough. Ask about: Should I take this medication?               Discharge Care Instructions  (From admission, onward)           Start     Ordered   01/29/22 0000  Leave dressing on - Keep it clean, dry, and intact until clinic visit        01/29/22 0949   01/29/22 0000  Leave dressing on - Keep it clean, dry, and intact until clinic visit        01/29/22 0942   01/28/22 0000  Leave dressing on - Keep it clean, dry, and intact until clinic visit        01/28/22 [place] Follow up today.   Why: follow up with your facility provider or PCP within 1 week Contact information: [address] [place] [address] [phone]         [name], DPM Follow up in 1 week(s).   Specialty: Podiatry Why: Make appointment and follow up with your podiatrist Dr. Boneta Lucks in 1 week Contact information: [address] [place] [address] [phone]                 Discharge Exam: Danley Danker Weights   01/25/22 1623 01/25/22 1736  Weight: 68 kg 70.4 kg    Constitutional: Awake alert and oriented x3, no associated distress.   Skin: Dressing of right foot is clean dry and intact. Respiratory: clear to auscultation bilaterally, no wheezing, no crackles. Normal respiratory effort. No accessory muscle use.  Cardiovascular: Regular rate and rhythm, no murmurs / rubs / gallops. No extremity edema. 2+ pedal pulses. No  carotid bruits.  Abdomen: Abdomen is soft and nontender.  No evidence of intra-abdominal masses.  Positive bowel sounds noted in all quadrants.   Musculoskeletal: Deformity of right foot status post transmetatarsal amputation.  Condition at discharge: fair  The results of significant diagnostics from this hospitalization (including imaging, microbiology, ancillary and laboratory) are listed below for reference.   Imaging Studies: DG Foot 2 Views Right  Result Date: 01/28/2022 CLINICAL DATA:  Status post transmetatarsal amputation. EXAM: RIGHT FOOT - 2 VIEW COMPARISON:  Right foot radiographs dated 01/25/2022 FINDINGS: Postsurgical changes from transmetatarsal amputation of the right foot. Surgical staples overlie the distal foot and distal Achilles tendon associated with subcutaneous emphysema. Vascular calcifications are again seen. No additional retained foreign bodies. There is no evidence of fracture or dislocation. IMPRESSION: Postsurgical changes from transmetatarsal amputation of the right foot. Electronically Signed   By: Darrin Nipper M.D.   On: 01/28/2022 10:36   DG Chest 1 View  Result Date: 01/26/2022 CLINICAL DATA:  Pneumonia EXAM: CHEST  1 VIEW COMPARISON:  11/17/2021 FINDINGS: Unchanged cardiac and mediastinal contours. Decreased right mid lung opacity. Improved bibasilar atelectasis. Left midlung linear opacity, unchanged, likely scarring. No pleural effusion or pneumothorax. IMPRESSION: Decreased right mid lung opacity, likely resolving infection. Electronically Signed   By: Merilyn Baba M.D.   On: 01/26/2022 11:48   DG Foot Complete Right  Result Date: 01/25/2022 CLINICAL DATA:  Nonhealing foot wounds. EXAM: RIGHT FOOT COMPLETE - 3+ VIEW COMPARISON:  MRI 12/10/2021 FINDINGS: There appears to be gas in the soft tissues overlying the amputated second toe. This is likely an open wound. There is cortical loss involving the second metatarsal head suspicious for osteomyelitis. I do not  see any other obvious sites of osteomyelitis. Severe vascular disease is noted. IMPRESSION: 1. Plain film findings suggest osteomyelitis involving the second metatarsal head. 2. Severe vascular disease. 3. No other obvious sites of osteomyelitis. Electronically Signed   By: Marijo Sanes M.D.   On: 01/25/2022 13:27    Microbiology: Results for orders placed or performed during the hospital encounter of 01/25/22  Culture, blood (routine x 2)     Status: None (Preliminary result)   Collection Time: 01/25/22 12:30 PM   Specimen: BLOOD  Result Value Ref Range Status   Specimen Description   Final    BLOOD LEFT ANTECUBITAL Performed at [facility] [address] [place], [place] [address]    Special Requests   Final    BOTTLES DRAWN AEROBIC ONLY Blood Culture results may not be optimal due to an inadequate volume of blood received in culture bottles Performed at [facility] [address], [place], [place] [address]    Culture   Final    NO GROWTH 4 DAYS Performed at [facility], [place] [address] [place], [place] [address]    Report Status PENDING  Incomplete  Culture, blood (routine x 2)     Status: None (Preliminary result)   Collection Time: 01/25/22 12:35 PM   Specimen: BLOOD LEFT FOREARM  Result Value Ref Range Status   Specimen Description   Final    BLOOD LEFT FOREARM Performed at [facility] [address] [place], [place] [address]    Special Requests   Final    BOTTLES DRAWN AEROBIC AND ANAEROBIC Blood Culture results may not be optimal due to an inadequate volume of blood received in culture bottles Performed at [facility] [address] [place], [place] [address]    Culture   Final    NO GROWTH 4 DAYS Performed at [facility], [place] [address], [place], [place] [address]    Report Status PENDING  Incomplete  Resp Panel by RT-PCR (Flu A&B, Covid) Anterior Nasal Swab      Status: None   Collection Time: 01/26/22  8:11 AM   Specimen: Anterior Nasal Swab  Result Value Ref Range Status   SARS Coronavirus 2 by RT PCR NEGATIVE NEGATIVE Final    Comment: (NOTE) SARS-CoV-2 target nucleic acids are NOT DETECTED.  The SARS-CoV-2 RNA is generally detectable in upper respiratory specimens during the acute phase of infection. The lowest concentration of SARS-CoV-2 viral copies this assay can detect is 138 copies/mL. A negative result does not preclude SARS-Cov-2 infection and should not be used as the sole basis for treatment or other patient management decisions. A negative result may occur with  improper specimen collection/handling, submission of specimen other than nasopharyngeal swab, presence of viral mutation(s) within the areas targeted by this assay, and inadequate number of viral copies(<138 copies/mL). A negative result must be combined with clinical observations, patient history, and epidemiological information. The expected result is Negative.  Fact Sheet for Patients:  EntrepreneurPulse.com.au  Fact Sheet for Healthcare Providers:  IncredibleEmployment.be  This test is no t yet approved or cleared by the [place] FDA and  has been authorized for detection and/or diagnosis of SARS-CoV-2 by FDA under an Emergency Use Authorization (EUA). This EUA will remain  in effect (meaning this test can be used) for the duration of the COVID-19 declaration under Section 564(b)(1) of the Act, 21 U.S.C.section 360bbb-3(b)(1), unless the authorization is terminated  or revoked sooner.       Influenza A by PCR NEGATIVE NEGATIVE Final   Influenza B by PCR NEGATIVE NEGATIVE Final    Comment: (NOTE) The Xpert Xpress SARS-CoV-2/FLU/RSV plus assay is intended as an aid in the diagnosis of influenza from Nasopharyngeal swab specimens and should not be used as a sole basis for treatment. Nasal washings and aspirates are  unacceptable for Xpert Xpress SARS-CoV-2/FLU/RSV testing.  Fact Sheet for Patients: EntrepreneurPulse.com.au  Fact Sheet for Healthcare Providers: IncredibleEmployment.be  This test is not yet approved or cleared by the [place] FDA and has been authorized for detection and/or diagnosis of SARS-CoV-2 by FDA under an Emergency Use Authorization (EUA). This EUA will remain in effect (meaning this test can be used) for the duration of the COVID-19 declaration under Section 564(b)(1) of the Act, 21 U.S.C. section 360bbb-3(b)(1), unless the authorization is terminated or revoked.  Performed at [facility], [place] [address], [place], [place] [address]   Surgical pcr screen     Status: None   Collection Time: 01/26/22  6:16 PM   Specimen: Nasal Mucosa; Nasal Swab  Result Value Ref Range Status   MRSA, PCR NEGATIVE NEGATIVE Final   Staphylococcus aureus NEGATIVE NEGATIVE Final    Comment: (NOTE) The Xpert SA Assay (FDA approved for NASAL specimens in patients 53 years of age and older), is one component of a comprehensive surveillance program. It is not intended to diagnose infection nor to guide or monitor treatment. Performed at [facility], [place] [address], [place], [place] [address]     Labs: CBC: Recent Labs  Lab 01/25/22 1245 01/26/22 0328 01/27/22 0338 01/28/22 0339  WBC 8.8 9.1 9.5 10.4  NEUTROABS 4.5  --  4.1 5.7  HGB 9.1* 8.1* 8.1* 8.2*  HCT 28.2* 24.4* 25.2* 25.6*  MCV 92.5 90.7 93.7 93.8  PLT 299 263 249 270   Basic Metabolic Panel: Recent Labs  Lab 01/25/22 1245 01/26/22 0328 01/27/22 0338 01/28/22 0339  NA 136 136 134* 134*  K 3.5 3.1* 3.7 3.5  CL 104 107 106 106  CO2 '25 24 23 22  ' GLUCOSE 116* 73 139* 168*  BUN 26* 24* 26* 26*  CREATININE 1.66* 1.39* 1.35* 1.39*  CALCIUM 8.5* 8.1* 8.1* 8.1*  MG  --   --  1.8 1.6*   Liver Function Tests: Recent Labs  Lab  01/26/22 0328 01/27/22 0338 01/28/22 0339  AST '17 15 17  ' ALT '11 10 8  ' ALKPHOS 74 69 71  BILITOT 0.6 0.6 0.6  PROT 7.1 7.0 7.0  ALBUMIN 2.6* 2.5* 2.6*   CBG: Recent Labs  Lab 01/28/22 0943 01/28/22 1133 01/28/22 1612 01/28/22 2101 01/29/22 0751  GLUCAP 140* 181* 344* 316*  263* 269*    Discharge time spent: greater than 30 minutes.  Signed: Vernelle Emerald, MD [facility] 01/29/2022

## 2022-01-29 NOTE — Discharge Instructions (Signed)
Patient to receive Lantus and Novolog as instructed Patient's right foot dressing to remain in place until Podiatry follow up. Patient to have appointment made with Dr. Boneta Lucks with Podiatry in one week. Patient to folllow up with facility provider or PCP within 1 week.

## 2022-01-30 ENCOUNTER — Ambulatory Visit (HOSPITAL_BASED_OUTPATIENT_CLINIC_OR_DEPARTMENT_OTHER): Payer: Medicare Other | Admitting: General Surgery

## 2022-01-30 LAB — CULTURE, BLOOD (ROUTINE X 2)
Culture: NO GROWTH
Culture: NO GROWTH

## 2022-01-31 LAB — SURGICAL PATHOLOGY

## 2022-02-07 ENCOUNTER — Ambulatory Visit (INDEPENDENT_AMBULATORY_CARE_PROVIDER_SITE_OTHER): Payer: Medicare Other | Admitting: Podiatry

## 2022-02-07 ENCOUNTER — Ambulatory Visit (INDEPENDENT_AMBULATORY_CARE_PROVIDER_SITE_OTHER): Payer: Medicare Other

## 2022-02-07 DIAGNOSIS — Z89431 Acquired absence of right foot: Secondary | ICD-10-CM | POA: Diagnosis not present

## 2022-02-07 DIAGNOSIS — E1122 Type 2 diabetes mellitus with diabetic chronic kidney disease: Secondary | ICD-10-CM

## 2022-02-07 DIAGNOSIS — Z794 Long term (current) use of insulin: Secondary | ICD-10-CM

## 2022-02-07 DIAGNOSIS — N1832 Chronic kidney disease, stage 3b: Secondary | ICD-10-CM

## 2022-02-07 NOTE — Progress Notes (Signed)
Subjective:  Patient ID: Justin [name], male    DOB: 1934-10-18,  MRN: [id number]  Chief Complaint  Patient presents with   Routine Post Op    POV #1 DOS 01/28/22 TRANSMETATARSAL AMPUTATION; ACHILLES TENDON LENGTHENING     DOS: 01/28/2022 Procedure: Right transmetatarsal amputation with Achilles tendon lengthening  87 y.o. male returns for post-op check.  Patient is doing well.  He is nonweightbearing to the right lower extremity no nausea fever chills vomiting bandages clean dry and intact.  Review of Systems: Negative except as noted in the HPI. Denies N/V/F/Ch.  Past Medical History:  Diagnosis Date   Acute metabolic encephalopathy 09/26/5091   Arthritis    Diabetes mellitus    HTN (hypertension)    Hypercholesteremia    Iron deficiency    Pancreatitis, acute 06/02/2012   Diagnosed 05/2012    Personal history of colonic adenomas 04/15/2013   04/15/2013 2 diminutive polyps     Stroke ([place])    TIA (transient ischemic attack)     Current Outpatient Medications:    acetaminophen (TYLENOL) 325 MG tablet, Take 650 mg by mouth in the morning., Disp: , Rfl:    ADVAIR HFA 230-21 MCG/ACT inhaler, Inhale 2 puffs into the lungs every 12 (twelve) hours. Rinse mouth afterwards, Disp: , Rfl:    Alcohol Swabs PADS, Use to check blood sugar 3 times daily. Dx: E10.9, Disp: 300 each, Rfl: 3   Amino Acids-Protein Hydrolys (PRO-STAT) LIQD, Take 30 mLs by mouth 3 (three) times daily between meals., Disp: , Rfl:    aspirin 81 MG chewable tablet, Chew 81 mg by mouth daily., Disp: , Rfl:    atorvastatin (LIPITOR) 40 MG tablet, Take 40 mg by mouth at bedtime., Disp: , Rfl:    Blood Glucose Calibration (ACCU-CHEK AVIVA) SOLN, Use to check blood sugar 3 times daily. Dx: E10.9, Disp: 1 each, Rfl: 3   Blood Glucose Monitoring Suppl (ACCU-CHEK AVIVA PLUS) w/Device KIT, Use to check blood sugar 3 times daily. Dx: E10.9, Disp: 1 kit, Rfl: 0   clopidogrel (PLAVIX) 75 MG tablet, Take 1 tablet (75 mg total) by  mouth daily with breakfast., Disp: , Rfl:    doxycycline (VIBRA-TABS) 100 MG tablet, Take 1 tablet (100 mg total) by mouth 2 (two) times daily for 18 doses. First dose evening of 10/23, Disp: 18 tablet, Rfl: 0   Emollient (AQUAPHOR ADV PROTECT HEALING) 41 % OINT, Apply 1 application  topically See admin instructions. Apply to feet/toes once a day after mixing with Lidocaine cream, Disp: , Rfl:    ferrous gluconate (FERGON) 324 MG tablet, Take 324 mg by mouth See admin instructions. Take 324 mg by mouth once a day on Mon/Tues/Wed/Thurs/Fri, Disp: , Rfl:    glucagon (GLUCAGON EMERGENCY) 1 MG injection, Inject 1 mg into the muscle once as needed. (Patient taking differently: Inject 1 mg into the muscle once as needed (for low blood sugar).), Disp: 1 each, Rfl: 12   glucose blood (ACCU-CHEK AVIVA PLUS) test strip, Use to check blood sugar 3 times daily. Dx: E10.9, Disp: 300 each, Rfl: 3   hydrOXYzine (ATARAX) 10 MG tablet, Take 10 mg by mouth See admin instructions. Take 10 mg by mouth two times a day and hold for sedation, Disp: , Rfl:    insulin aspart (NOVOLOG) 100 UNIT/ML injection, Inject 2 Units into the skin 3 (three) times daily with meals. Do not give if patient is not eating, Disp: 10 mL, Rfl: 11   insulin glargine (LANTUS) 100  UNIT/ML injection, Inject 11 Units into the skin in the morning., Disp: , Rfl:    Insulin Pen Needle 32G X 4 MM MISC, Use as directed to inject insulin tidwc, Disp: 100 each, Rfl: 11   Ipratropium-Albuterol (COMBIVENT) 20-100 MCG/ACT AERS respimat, Inhale 1 puff into the lungs every 6 (six) hours as needed for wheezing. (Patient taking differently: Inhale 1 puff into the lungs every 6 (six) hours as needed (for COPD).), Disp: , Rfl:    isosorbide mononitrate (ISMO) 10 MG tablet, Take 10 mg by mouth at bedtime., Disp: , Rfl:    Lancets (ACCU-CHEK SOFT TOUCH) lancets, Use to check blood sugar 3 times daily. Dx: E10.9, Disp: 300 each, Rfl: 3   loratadine (CLARITIN) 10 MG  tablet, Take 10 mg by mouth daily., Disp: , Rfl:    NOVOLOG FLEXPEN 100 UNIT/ML FlexPen, Inject 0-8 Units into the skin See admin instructions. Inject 0-8 units into the skin before lunch and supper, per sliding scale: if is BS 70-200 give 0 units, if BS is 201-250 give 2 units, if BS is 251-300 give 4 units, if BS is 301-350 give 6 units, if BS is 351-400 give 8 units. If blood sugar is greater than 400, call MD., Disp: , Rfl:    Nutritional Supplements (,FEEDING SUPPLEMENT, PROSOURCE PLUS) liquid, Take 30 mLs by mouth 3 (three) times daily between meals., Disp: 900 mL, Rfl: 2   omeprazole (PRILOSEC) 20 MG capsule, Take 20 mg by mouth every other day., Disp: , Rfl:    oxyCODONE (OXY IR/ROXICODONE) 5 MG immediate release tablet, Take 1 tablet (5 mg total) by mouth every 6 (six) hours as needed for severe pain or breakthrough pain. (Patient not taking: Reported on 01/25/2022), Disp: 15 tablet, Rfl: 0   Pancrelipase, Lip-Prot-Amyl, (CREON) 24000-76000 units CPEP, Take 1 capsule by mouth 3 (three) times daily. With 4-6 ounces of water, Disp: , Rfl:    Skin Protectants, Misc. (MINERIN CREME EX), Apply 1 application  topically See admin instructions. Apply to both feet and legs at bedtime, Disp: , Rfl:    Spacer/Aero-Holding Dorise Bullion, Use as directed with combivent. Ok to dispense any spacer that works with the inhaler being rxed to pt., Disp: 1 each, Rfl: 1   TRULICITY 1.5 YN/8.2NF SOPN, Inject 1.5 mg into the skin every Friday., Disp: , Rfl:   Social History   Tobacco Use  Smoking Status Former   Years: 35.00   Types: Cigarettes   Quit date: 04/09/1990   Years since quitting: 31.8  Smokeless Tobacco Never    No Known Allergies Objective:  There were no vitals filed for this visit. There is no height or weight on file to calculate BMI. Constitutional Well developed. Well nourished.  Vascular Foot warm and well perfused. Capillary refill normal to all digits.   Neurologic Normal  speech. Oriented to person, place, and time. Epicritic sensation to light touch grossly present bilaterally.  Dermatologic Skin healing well without signs of infection. Skin edges well coapted without signs of infection.  Orthopedic: Tenderness to palpation noted about the surgical site.   Radiographs: None Assessment:   1. History of transmetatarsal amputation of right foot ([place])   2. Type 2 diabetes mellitus with stage 3b chronic kidney disease, with long-term current use of insulin ([place])    Plan:  Patient was evaluated and treated and all questions answered.  S/p foot surgery right -Progressing as expected post-operatively. -XR: See above -WB Status: Nonweightbearing in right lower extremity in cam boot -  Sutures: Intact.  No clinical signs of dehiscence noted no complication noted. -Medications: None -Foot redressed.  No follow-ups on file.

## 2022-02-13 ENCOUNTER — Ambulatory Visit (INDEPENDENT_AMBULATORY_CARE_PROVIDER_SITE_OTHER): Payer: Medicare Other | Admitting: Internal Medicine

## 2022-02-13 ENCOUNTER — Other Ambulatory Visit: Payer: Self-pay

## 2022-02-13 ENCOUNTER — Encounter: Payer: Self-pay | Admitting: Internal Medicine

## 2022-02-13 VITALS — BP 124/70 | HR 98 | Resp 16 | Ht 74.0 in | Wt 155.2 lb

## 2022-02-13 DIAGNOSIS — E11621 Type 2 diabetes mellitus with foot ulcer: Secondary | ICD-10-CM | POA: Diagnosis not present

## 2022-02-13 DIAGNOSIS — L97529 Non-pressure chronic ulcer of other part of left foot with unspecified severity: Secondary | ICD-10-CM | POA: Diagnosis not present

## 2022-02-13 DIAGNOSIS — I739 Peripheral vascular disease, unspecified: Secondary | ICD-10-CM | POA: Diagnosis present

## 2022-02-13 NOTE — Patient Instructions (Signed)
See podiatry as they advise for ongoing foot care  It didn't appear based on pathology you had bone infection  No need to see our clinic again at this time

## 2022-02-13 NOTE — Progress Notes (Signed)
[facility] Infectious Disease  Patient Active Problem List   Diagnosis Date Noted   GERD without esophagitis 01/26/2022   Osteomyelitis of second toe of right foot ([place]) 01/25/2022   Altered mental status    PAD (peripheral artery disease) ([place]) 12/09/2021   Balanitis 12/09/2021   Pressure injury of skin 11/25/2021   Diabetic foot ([place]) 11/16/2021   Diabetic foot infection ([place]) 11/15/2021   Intertrigo 11/15/2021   History of CVA (cerebrovascular accident) 05/21/2021   Chronic kidney disease, stage 3b ([place]) 05/21/2021   Mixed diabetic hyperlipidemia associated with type 2 diabetes mellitus ([place]) 05/21/2021   Polyneuropathy due to type 2 diabetes mellitus ([place]) 05/21/2021   Leukocytosis 05/21/2021   Type 2 diabetes mellitus with stage 3b chronic kidney disease, with long-term current use of insulin ([place]) 09/29/2015   Arthritis 09/06/2015   Diabetic peripheral neuropathy ([place]) 08/17/2015   OA (osteoarthritis) of knee 08/17/2015   Primary osteoarthritis of both knees 08/11/2015   Primary osteoarthritis of left hip 08/11/2015   Hammer toe, acquired 09/30/2013   Personal history of colonic adenomas 04/15/2013   Chronic pancreatitis ([place]) 02/26/2013   Anemia of chronic disease 02/26/2013   Goals of care, counseling/discussion 02/26/2013   Essential hypertension 12/14/2012   Testosterone deficiency 10/22/2011   Headache above the eye region 02/22/2011   Coronary artery disease 02/22/2011      Subjective:    Patient ID: Justin [name], male    DOB: [birth year], 87 y.o.   MRN: [id number]  Chief Complaint  Patient presents with   Follow-up   Cc- f/u left foot ulcers in setting dm2 and PAD  HPI:  Justin [name] is a 87 y.o. male  pmh DM2, HTN, PAD, stroke, CKD 3b, recent gangrene 2nd left toe s/p disarticulation, admitted from snf 9/2-9/5 to [facility] for ams, with imaging showing sign of new om in his 2nd, 55rd, and 1st ray. He is here for hospital follow  up  I am unclear what the underlying reason for ams is I suspect it is probably delirium. I do not see obvious active cellulitis in the foot and I suspect the new mri changes could be explained by ongoing dry gangrene process, but also potentially ongoing progressive OM   Ucx negative; no bcx obtained but no sepsis physiology otherwise, and ams already improving quickly     Certainly could be om changes too which would require biopsy/cx for definitive dx   But given his significant PAD I am not sure the benefit is in his favor with more local foot debridement or tma   I agree with dr Jacqualyn Posey maybe a trial of long term abx is worth while before further decision is made, with vascular input  Patient discharged on doxy/cefadroxil planned 4-6 weeks  ---- 9/13 id clinic f/u He saw wound care and some bedside debridement done for open wound at 2nd mtp disarticulation site He continues on doxy/cefadroxil at snf and no side effect No fever, chill  He is seeing both podiatry/wound clinic within the past week    02/13/22 id clinic f/u Patient ultimately underwent right foot tma 10/22 with podiatry given no improvement on empiric abx Incision intact no breakdown No f/c Off abx since 10/12  No om on path; just gangrene  He is doing well  Will see podiatry again to remove staples   No Known Allergies    Outpatient Medications Prior to Visit  Medication Sig Dispense Refill  acetaminophen (TYLENOL) 325 MG tablet Take 650 mg by mouth in the morning.     ADVAIR HFA 230-21 MCG/ACT inhaler Inhale 2 puffs into the lungs every 12 (twelve) hours. Rinse mouth afterwards     Alcohol Swabs PADS Use to check blood sugar 3 times daily. Dx: E10.9 300 each 3   Amino Acids-Protein Hydrolys (PRO-STAT) LIQD Take 30 mLs by mouth 3 (three) times daily between meals.     aspirin 81 MG chewable tablet Chew 81 mg by mouth daily.     atorvastatin (LIPITOR) 40 MG tablet Take 40 mg by mouth at bedtime.      Blood Glucose Calibration (ACCU-CHEK AVIVA) SOLN Use to check blood sugar 3 times daily. Dx: E10.9 1 each 3   Blood Glucose Monitoring Suppl (ACCU-CHEK AVIVA PLUS) w/Device KIT Use to check blood sugar 3 times daily. Dx: E10.9 1 kit 0   clopidogrel (PLAVIX) 75 MG tablet Take 1 tablet (75 mg total) by mouth daily with breakfast.     doxycycline (MONODOX) 100 MG capsule Take 100 mg by mouth 2 (two) times daily.     Emollient (AQUAPHOR ADV PROTECT HEALING) 41 % OINT Apply 1 application  topically See admin instructions. Apply to feet/toes once a day after mixing with Lidocaine cream     ferrous gluconate (FERGON) 324 MG tablet Take 324 mg by mouth See admin instructions. Take 324 mg by mouth once a day on Mon/Tues/Wed/Thurs/Fri     glucagon (GLUCAGON EMERGENCY) 1 MG injection Inject 1 mg into the muscle once as needed. (Patient taking differently: Inject 1 mg into the muscle once as needed (for low blood sugar).) 1 each 12   glucose blood (ACCU-CHEK AVIVA PLUS) test strip Use to check blood sugar 3 times daily. Dx: E10.9 300 each 3   hydrOXYzine (ATARAX) 10 MG tablet Take 10 mg by mouth See admin instructions. Take 10 mg by mouth two times a day and hold for sedation     insulin aspart (NOVOLOG) 100 UNIT/ML injection Inject 2 Units into the skin 3 (three) times daily with meals. Do not give if patient is not eating 10 mL 11   insulin glargine (LANTUS) 100 UNIT/ML injection Inject 11 Units into the skin in the morning.     Insulin Pen Needle 32G X 4 MM MISC Use as directed to inject insulin tidwc 100 each 11   Ipratropium-Albuterol (COMBIVENT) 20-100 MCG/ACT AERS respimat Inhale 1 puff into the lungs every 6 (six) hours as needed for wheezing. (Patient taking differently: Inhale 1 puff into the lungs every 6 (six) hours as needed (for COPD).)     isosorbide mononitrate (ISMO) 10 MG tablet Take 10 mg by mouth at bedtime.     Lancets (ACCU-CHEK SOFT TOUCH) lancets Use to check blood sugar 3 times daily. Dx:  E10.9 300 each 3   loratadine (CLARITIN) 10 MG tablet Take 10 mg by mouth daily.     NOVOLOG FLEXPEN 100 UNIT/ML FlexPen Inject 0-8 Units into the skin See admin instructions. Inject 0-8 units into the skin before lunch and supper, per sliding scale: if is BS 70-200 give 0 units, if BS is 201-250 give 2 units, if BS is 251-300 give 4 units, if BS is 301-350 give 6 units, if BS is 351-400 give 8 units. If blood sugar is greater than 400, call MD.     Nutritional Supplements (,FEEDING SUPPLEMENT, PROSOURCE PLUS) liquid Take 30 mLs by mouth 3 (three) times daily between meals. 900 mL 2  omeprazole (PRILOSEC) 20 MG capsule Take 20 mg by mouth every other day.     Pancrelipase, Lip-Prot-Amyl, (CREON) 24000-76000 units CPEP Take 1 capsule by mouth 3 (three) times daily. With 4-6 ounces of water     Skin Protectants, Misc. (MINERIN CREME EX) Apply 1 application  topically See admin instructions. Apply to both feet and legs at bedtime     Spacer/Aero-Holding Dorise Bullion Use as directed with combivent. Ok to dispense any spacer that works with the inhaler being rxed to pt. 1 each 1   TRULICITY 1.5 VZ/8.5YI SOPN Inject 1.5 mg into the skin every Friday.     oxyCODONE (OXY IR/ROXICODONE) 5 MG immediate release tablet Take 1 tablet (5 mg total) by mouth every 6 (six) hours as needed for severe pain or breakthrough pain. (Patient not taking: Reported on 01/25/2022) 15 tablet 0   No facility-administered medications prior to visit.     Social History   Socioeconomic History   Marital status: Married    Spouse name: Not on file   Number of children: 6   Years of education: Not on file   Highest education level: Not on file  Occupational History   Not on file  Tobacco Use   Smoking status: Former    Years: 35.00    Types: Cigarettes    Quit date: 04/09/1990    Years since quitting: 31.8   Smokeless tobacco: Never  Substance and Sexual Activity   Alcohol use: No    Comment: "per pt stopped drinking  in the 90's"   Drug use: No   Sexual activity: Not on file  Other Topics Concern   Not on file  Social History Narrative   Not on file   Social Determinants of Health   Financial Resource Strain: Not on file  Food Insecurity: No Food Insecurity (01/25/2022)   Hunger Vital Sign    Worried About Running Out of Food in the Last Year: Never true    Ran Out of Food in the Last Year: Never true  Transportation Needs: No Transportation Needs (01/25/2022)   PRAPARE - Hydrologist (Medical): No    Lack of Transportation (Non-Medical): No  Physical Activity: Not on file  Stress: Not on file  Social Connections: Not on file  Intimate Partner Violence: Not At Risk (01/25/2022)   Humiliation, Afraid, Rape, and Kick questionnaire    Fear of Current or Ex-Partner: No    Emotionally Abused: No    Physically Abused: No    Sexually Abused: No      Review of Systems     Objective:    There were no vitals taken for this visit. Nursing note and vital signs reviewed.  Physical Exam     General/constitutional: no distress, pleasant HEENT: Normocephalic, PER, Conj Clear, EOMI, Oropharynx clear Neck supple CV: rrr no mrg Lungs: clear to auscultation, normal respiratory effort Abd: Soft, Nontender Ext: no edema Skin/msk -- see pics  Patient s/p tma right foot Incision staples/sutures intact no dehiscence; no swelling/warmth. Slight tenderness      Labs:  Micro:  Serology:  Imaging: Reviewed   8/9 mri right foot Distal great toe ulceration with erosion of the distal phalangeal tuft and questionable minimal edema signal at the tip of the residual distal phalanx. There is largely preserved marrow signal. Findings could reflect very early osteomyelitis or reactive marrow change. No evidence of soft tissue abscess.   9/3 mri right foot Postsurgical changes of second  toe amputation. Marrow signal abnormality and cortical irregularity at the  second and third metatarsal heads and third toe proximal phalanx consistent with early osteomyelitis. Soft tissue swelling without evidence of abscess.   Minimal marrow signal abnormality dorsal aspect of the great toe metatarsal head at the MTP joint with preserved T1 marrow signal and trace first MTP joint fluid, may suggest subacute osteomyelitis given cortical regularity on recent CT.   Mild marrow edema in the adjacent first MTP lateral sesamoid, potentially early osteomyelitis or reactive change.  Assessment & Plan:   Problem List Items Addressed This Visit   None Visit Diagnoses     Peripheral arterial disease ([place])    -  Primary   Diabetic ulcer of toe of left foot associated with type 2 diabetes mellitus, unspecified ulcer stage ([place])            No orders of the defined types were placed in this encounter.    Abx:   9/4-c cefadroxil 9/4-c doxy   Prior to 9/4 Outpatient doxy continued                                                       Assessment: Ams ?infection vs other Recent diabetic foot infection 2nd toe om s/p disarticulation; cx mrsa dm2  Pvd   87 y.o. male pmh DM2, HTN, PAD, stroke, CKD 3b, recent gangrene 2nd left toe s/p disarticulation, admitted from snf for ams, with imaging showing sign of new om in his 2nd, 41rd, and 1st ray  I am unclear what the underlying reason for ams is I suspect it is probably delirium. I do not see obvious active cellulitis in the foot and I suspect the new mri changes could be explained by ongoing dry gangrene process, but also potentially ongoing progressive OM   Ucx negative; no bcx obtained but no sepsis physiology otherwise, and ams already improving quickly     Certainly could be om changes too which would require biopsy/cx for definitive dx   But given his significant PAD I am not sure the benefit is in his favor with more local foot debridement or tma   I agree with dr Jacqualyn Posey maybe a trial of long term  abx is worth while before further decision is made, with vascular input  Patient discharged on doxy/cefadroxil planned 6 weeks  ------- 9/13 clinica assement Still open wound where the 2nd toe was disarticulated at mtp joint no sign of abscess/cellulitis He appears very well baseline in terms of good mentation   -labs today -continue cefadroxil/doxy until 10/12 -see me on 10/25 a couple weeks after abx finish -continue to see wound and podiatry clinic as discussed with them   02/13/22 id clinic assessment 10/22 tma right foot -- path no osteomyelitis, but rather gangrenous changes Suspect all was PAD disease related. We did initiate abx prior to that for mri changes presumed infection due to high risk biopsy of bone  Regardless finished with the 4-6 weeks empiric abx and had tma  -f/u podiatry for ongoing foot care -no need to f/u id clinic at this time unless podiatry has further concern about infection  I have spent a total of 20 minutes of face-to-face and non-face-to-face time, excluding clinical staff time, preparing to see patient, ordering tests and/or medications, and provide counseling the patient  Follow-up: No follow-ups on file.      Jabier Mutton, [facility] 02/13/2022, 2:24 PM

## 2022-02-23 ENCOUNTER — Ambulatory Visit (INDEPENDENT_AMBULATORY_CARE_PROVIDER_SITE_OTHER): Payer: Medicare Other | Admitting: Podiatry

## 2022-02-23 DIAGNOSIS — N1832 Chronic kidney disease, stage 3b: Secondary | ICD-10-CM

## 2022-02-23 DIAGNOSIS — Z794 Long term (current) use of insulin: Secondary | ICD-10-CM

## 2022-02-23 DIAGNOSIS — E1122 Type 2 diabetes mellitus with diabetic chronic kidney disease: Secondary | ICD-10-CM

## 2022-02-23 DIAGNOSIS — Z89431 Acquired absence of right foot: Secondary | ICD-10-CM

## 2022-02-23 NOTE — Progress Notes (Unsigned)
Subjective:  Patient ID: Justin [name], male    DOB: Sep 07, 1934,  MRN: [id number]  Chief Complaint  Patient presents with   Routine Post Op    POV #2 DOS 01/28/22 TRANSMETATARSAL AMPUTATION; ACHILLES TENDON LENGTHENING - Dr Paulla Dolly    DOS: 01/28/2022 Procedure: Right transmetatarsal amputation with Achilles tendon lengthening  87 y.o. male returns for post-op check.  Patient is doing well.  He is nonweightbearing to the right lower extremity no nausea fever chills vomiting bandages clean dry and intact.  Review of Systems: Negative except as noted in the HPI. Denies N/V/F/Ch.  Past Medical History:  Diagnosis Date   Acute metabolic encephalopathy 1/32/4401   Arthritis    Diabetes mellitus    HTN (hypertension)    Hypercholesteremia    Iron deficiency    Pancreatitis, acute 06/02/2012   Diagnosed 05/2012    Personal history of colonic adenomas 04/15/2013   04/15/2013 2 diminutive polyps     Stroke ([place])    TIA (transient ischemic attack)     Current Outpatient Medications:    acetaminophen (TYLENOL) 325 MG tablet, Take 650 mg by mouth in the morning., Disp: , Rfl:    ADVAIR HFA 230-21 MCG/ACT inhaler, Inhale 2 puffs into the lungs every 12 (twelve) hours. Rinse mouth afterwards, Disp: , Rfl:    Alcohol Swabs PADS, Use to check blood sugar 3 times daily. Dx: E10.9, Disp: 300 each, Rfl: 3   Amino Acids-Protein Hydrolys (PRO-STAT) LIQD, Take 30 mLs by mouth 3 (three) times daily between meals., Disp: , Rfl:    aspirin 81 MG chewable tablet, Chew 81 mg by mouth daily., Disp: , Rfl:    atorvastatin (LIPITOR) 40 MG tablet, Take 40 mg by mouth at bedtime., Disp: , Rfl:    Blood Glucose Calibration (ACCU-CHEK AVIVA) SOLN, Use to check blood sugar 3 times daily. Dx: E10.9, Disp: 1 each, Rfl: 3   Blood Glucose Monitoring Suppl (ACCU-CHEK AVIVA PLUS) w/Device KIT, Use to check blood sugar 3 times daily. Dx: E10.9, Disp: 1 kit, Rfl: 0   clopidogrel (PLAVIX) 75 MG tablet, Take 1 tablet (75 mg  total) by mouth daily with breakfast., Disp: , Rfl:    doxycycline (MONODOX) 100 MG capsule, Take 100 mg by mouth 2 (two) times daily. (Patient not taking: Reported on 02/13/2022), Disp: , Rfl:    Emollient (AQUAPHOR ADV PROTECT HEALING) 41 % OINT, Apply 1 application  topically See admin instructions. Apply to feet/toes once a day after mixing with Lidocaine cream, Disp: , Rfl:    ferrous gluconate (FERGON) 324 MG tablet, Take 324 mg by mouth See admin instructions. Take 324 mg by mouth once a day on Mon/Tues/Wed/Thurs/Fri, Disp: , Rfl:    glucagon (GLUCAGON EMERGENCY) 1 MG injection, Inject 1 mg into the muscle once as needed. (Patient taking differently: Inject 1 mg into the muscle once as needed (for low blood sugar).), Disp: 1 each, Rfl: 12   glucose blood (ACCU-CHEK AVIVA PLUS) test strip, Use to check blood sugar 3 times daily. Dx: E10.9, Disp: 300 each, Rfl: 3   hydrOXYzine (ATARAX) 10 MG tablet, Take 10 mg by mouth See admin instructions. Take 10 mg by mouth two times a day and hold for sedation, Disp: , Rfl:    insulin aspart (NOVOLOG) 100 UNIT/ML injection, Inject 2 Units into the skin 3 (three) times daily with meals. Do not give if patient is not eating, Disp: 10 mL, Rfl: 11   insulin glargine (LANTUS) 100 UNIT/ML injection, Inject 11  Units into the skin in the morning., Disp: , Rfl:    Insulin Pen Needle 32G X 4 MM MISC, Use as directed to inject insulin tidwc, Disp: 100 each, Rfl: 11   Ipratropium-Albuterol (COMBIVENT) 20-100 MCG/ACT AERS respimat, Inhale 1 puff into the lungs every 6 (six) hours as needed for wheezing. (Patient taking differently: Inhale 1 puff into the lungs every 6 (six) hours as needed (for COPD).), Disp: , Rfl:    isosorbide mononitrate (ISMO) 10 MG tablet, Take 10 mg by mouth at bedtime., Disp: , Rfl:    Lancets (ACCU-CHEK SOFT TOUCH) lancets, Use to check blood sugar 3 times daily. Dx: E10.9, Disp: 300 each, Rfl: 3   loratadine (CLARITIN) 10 MG tablet, Take 10 mg by  mouth daily., Disp: , Rfl:    NOVOLOG FLEXPEN 100 UNIT/ML FlexPen, Inject 0-8 Units into the skin See admin instructions. Inject 0-8 units into the skin before lunch and supper, per sliding scale: if is BS 70-200 give 0 units, if BS is 201-250 give 2 units, if BS is 251-300 give 4 units, if BS is 301-350 give 6 units, if BS is 351-400 give 8 units. If blood sugar is greater than 400, call MD., Disp: , Rfl:    Nutritional Supplements (,FEEDING SUPPLEMENT, PROSOURCE PLUS) liquid, Take 30 mLs by mouth 3 (three) times daily between meals., Disp: 900 mL, Rfl: 2   omeprazole (PRILOSEC) 20 MG capsule, Take 20 mg by mouth every other day., Disp: , Rfl:    oxyCODONE (OXY IR/ROXICODONE) 5 MG immediate release tablet, Take 1 tablet (5 mg total) by mouth every 6 (six) hours as needed for severe pain or breakthrough pain. (Patient not taking: Reported on 01/25/2022), Disp: 15 tablet, Rfl: 0   Pancrelipase, Lip-Prot-Amyl, (CREON) 24000-76000 units CPEP, Take 1 capsule by mouth 3 (three) times daily. With 4-6 ounces of water, Disp: , Rfl:    Skin Protectants, Misc. (MINERIN CREME EX), Apply 1 application  topically See admin instructions. Apply to both feet and legs at bedtime, Disp: , Rfl:    Spacer/Aero-Holding Dorise Bullion, Use as directed with combivent. Ok to dispense any spacer that works with the inhaler being rxed to pt., Disp: 1 each, Rfl: 1   TRULICITY 1.5 BZ/1.6RC SOPN, Inject 1.5 mg into the skin every Friday., Disp: , Rfl:   Social History   Tobacco Use  Smoking Status Former   Years: 35.00   Types: Cigarettes   Quit date: 04/09/1990   Years since quitting: 31.9  Smokeless Tobacco Never    No Known Allergies Objective:  There were no vitals filed for this visit. There is no height or weight on file to calculate BMI. Constitutional Well developed. Well nourished.  Vascular Foot warm and well perfused. Capillary refill normal to all digits.   Neurologic Normal speech. Oriented to person,  place, and time. Epicritic sensation to light touch grossly present bilaterally.  Dermatologic Skin healing well without signs of infection. Skin edges well coapted without signs of infection.  Orthopedic: Tenderness to palpation noted about the surgical site.   Radiographs: None Assessment:   No diagnosis found.  Plan:  Patient was evaluated and treated and all questions answered.  S/p foot surgery right -Progressing as expected post-operatively. -XR: See above -WB Status: Nonweightbearing in right lower extremity in cam boot -Sutures: Intact.  Not clinically reepithelialized.  Encouraged him to do Betadine wet-to-dry dressings daily.  We will take the stitches out during next clinical visit -Medications: None -Foot redressed.  No  follow-ups on file.

## 2022-03-13 ENCOUNTER — Ambulatory Visit (INDEPENDENT_AMBULATORY_CARE_PROVIDER_SITE_OTHER): Payer: Medicare Other | Admitting: Vascular Surgery

## 2022-03-13 ENCOUNTER — Encounter: Payer: Self-pay | Admitting: Vascular Surgery

## 2022-03-13 ENCOUNTER — Ambulatory Visit (HOSPITAL_COMMUNITY)
Admission: RE | Admit: 2022-03-13 | Discharge: 2022-03-13 | Disposition: A | Discharge status: Home / Self Care | Payer: Medicare Other | Source: Ambulatory Visit | Attending: Vascular Surgery | Admitting: Vascular Surgery

## 2022-03-13 ENCOUNTER — Ambulatory Visit (INDEPENDENT_AMBULATORY_CARE_PROVIDER_SITE_OTHER)
Admission: RE | Admit: 2022-03-13 | Discharge: 2022-03-13 | Disposition: A | Discharge status: Home / Self Care | Payer: Medicare Other | Source: Ambulatory Visit | Attending: Vascular Surgery | Admitting: Vascular Surgery

## 2022-03-13 VITALS — BP 147/74 | HR 83 | Temp 98.3°F | Resp 20 | Ht 74.0 in | Wt 155.0 lb

## 2022-03-13 DIAGNOSIS — I739 Peripheral vascular disease, unspecified: Secondary | ICD-10-CM

## 2022-03-13 NOTE — Progress Notes (Signed)
[facility]   ASSESSMENT / PLAN: Justin [name] is a 87 y.o. male with atherosclerosis of native arteries of right lower extremity s/p endovascular intervention and R TMA 01/28/22 by Dr. Posey Pronto.  Recommend the following which can slow the progression of atherosclerosis and reduce the risk of major adverse cardiac / limb events:  Complete cessation from all tobacco products. Blood glucose control with goal A1c < 7%. Blood pressure control with goal blood pressure < 140/90 mmHg. Lipid reduction therapy with goal LDL-C <100 mg/dL (<70 if symptomatic from PAD).  Aspirin 71m PO QD.  Plavix 766mPO QD. Atorvastatin 40-80mg PO QD (or other "high intensity" statin therapy).  Doing well overall. TMA healing. Encouraged continued follow up with Dr. PaPosey ProntoFollow up with me in 6 months with repeat non-invasive testing.  CHIEF COMPLAINT: Ulceration of right foot  HISTORY OF PRESENT ILLNESS: HeSHIVAM MESTASs a [age].o. male who returns to clinic for follow-up after angiogram 11/24/2021 done for atherosclerosis of the right lower extremity with gangrene.  He ultimately underwent a successful stenting of the right popliteal artery, angioplasty of the tibioperoneal trunk and peroneal artery.  Fortunately has peroneal only runoff and severely disadvantaged pedal circulation.  His right foot has continued to deteriorate.  He is in a wheelchair today.  He is getting meticulous wound care by the wound care center and Dr. StLoel Lofty 03/13/22: patient returns to clinic for follow up. He underwent R TMA with Dr. PaPosey Pronto0/22. He tolerated this well, and is healing well. No new complaints.   VASCULAR SURGICAL HISTORY:  11/24/21 - R popliteal drug coated angioplasty, right tibioperoneal trunk angioplasty, right peroneal artery angioplasty  Past Medical History:  Diagnosis Date   Acute metabolic encephalopathy 05/14/16/8416 Arthritis    Diabetes mellitus    HTN (hypertension)     Hypercholesteremia    Iron deficiency    Pancreatitis, acute 06/02/2012   Diagnosed 05/2012    Personal history of colonic adenomas 04/15/2013   04/15/2013 2 diminutive polyps     [facility]   TIA (transient ischemic attack)     Past Surgical History:  Procedure Laterality Date   ABDOMINAL AORTOGRAM W/LOWER EXTREMITY Right 11/24/2021   Procedure: ABDOMINAL AORTOGRAM W/LOWER EXTREMITY;  Surgeon: HaCherre RobinsMD;  [facility];  Service: Cardiovascular;  Laterality: Right;   AMPUTATION TOE Right 11/26/2021   Procedure: AMPUTATION TOE;  Surgeon: WaTrula SladeDPM;  [facility]: Podiatry;  Laterality: Right;   ARTERY BIOPSY  02/26/2011   Procedure: BIOPSY TEMPORAL ARTERY;  Surgeon: WiWilley BladeMD;  [facility] Service: General;  Laterality: Left;  Left temporal artery biospy   [place] PER[facility]HERAL V[facility] BALLOON ANGIOPLASTY Right 11/24/2021   Procedure: PERIPHERAL VASCULAR BALLOON ANGIOPLASTY;  Surgeon: HaCherre RobinsMD;  [facility];  Service: Cardiovascular;  Laterality: Right;  POPLITEAL AND PERONEAL   TEMPORAL ARTERY BIOPSY / LIGATION  02/26/2011   left side   TRANSMETATARSAL AMPUTATION Right 01/28/2022   Procedure: TRANSMETATARSAL AMPUTATION; ACHILLES TENDON LENGTHENING;  Surgeon: PaFelipa FurnaceDPM;  Location: [facility];  Service: Podiatry;  Laterality: Right;  R foot Transmetatarsal amputation, possible tendo achilles lengthening   UPPER GASTROINTESTINAL ENDOSCOPY      Family History  Problem  Relation Age of Onset   Heart disease Mother    Amblyopia Father    Diabetes Sister        x 2   Heart attack Sister     Social History   Socioeconomic History   Marital status: Married    Spouse name: Not on file   Number of children: 6   Years of education: Not on file   Highest education level: Not on file   Occupational History   Not on file  Tobacco Use   Smoking status: Former    Years: 35.00    Types: Cigarettes    Quit date: 04/09/1990    Years since quitting: 31.9   Smokeless tobacco: Never  Substance and Sexual Activity   Alcohol use: No    Comment: "per pt stopped drinking in the 90's"   Drug use: No   Sexual activity: Not on file  Other Topics Concern   Not on file  Social History Narrative   Not on file   Social Determinants of Health   Financial Resource Strain: Not on file  Food Insecurity: No Food Insecurity (01/25/2022)   Hunger Vital Sign    Worried About Running Out of Food in the Last Year: Never true    Ran Out of Food in the Last Year: Never true  Transportation Needs: No Transportation Needs (01/25/2022)   PRAPARE - Hydrologist (Medical): No    Lack of Transportation (Non-Medical): No  Physical Activity: Not on file  Stress: Not on file  Social Connections: Not on file  Intimate Partner Violence: Not At Risk (01/25/2022)   Humiliation, Afraid, Rape, and Kick questionnaire    Fear of Current or Ex-Partner: No    Emotionally Abused: No    Physically Abused: No    Sexually Abused: No    No Known Allergies  Current Outpatient Medications  Medication Sig Dispense Refill   acetaminophen (TYLENOL) 325 MG tablet Take 650 mg by mouth in the morning.     ADVAIR HFA 230-21 MCG/ACT inhaler Inhale 2 puffs into the lungs every 12 (twelve) hours. Rinse mouth afterwards     Alcohol Swabs PADS Use to check blood sugar 3 times daily. Dx: E10.9 300 each 3   Amino Acids-Protein Hydrolys (PRO-STAT) LIQD Take 30 mLs by mouth 3 (three) times daily between meals.     aspirin 81 MG chewable tablet Chew 81 mg by mouth daily.     atorvastatin (LIPITOR) 40 MG tablet Take 40 mg by mouth at bedtime.     Blood Glucose Calibration (ACCU-CHEK AVIVA) SOLN Use to check blood sugar 3 times daily. Dx: E10.9 1 each 3   Blood Glucose Monitoring Suppl  (ACCU-CHEK AVIVA PLUS) w/Device KIT Use to check blood sugar 3 times daily. Dx: E10.9 1 kit 0   clopidogrel (PLAVIX) 75 MG tablet Take 1 tablet (75 mg total) by mouth daily with breakfast.     doxycycline (MONODOX) 100 MG capsule Take 100 mg by mouth 2 (two) times daily.     Emollient (AQUAPHOR ADV PROTECT HEALING) 41 % OINT Apply 1 application  topically See admin instructions. Apply to feet/toes once a day after mixing with Lidocaine cream     ferrous gluconate (FERGON) 324 MG tablet Take 324 mg by mouth See admin instructions. Take 324 mg by mouth once a day on Mon/Tues/Wed/Thurs/Fri     glucagon (GLUCAGON EMERGENCY) 1 MG injection Inject 1 mg into the muscle once as needed. (Patient  taking differently: Inject 1 mg into the muscle once as needed (for low blood sugar).) 1 each 12   glucose blood (ACCU-CHEK AVIVA PLUS) test strip Use to check blood sugar 3 times daily. Dx: E10.9 300 each 3   hydrOXYzine (ATARAX) 10 MG tablet Take 10 mg by mouth See admin instructions. Take 10 mg by mouth two times a day and hold for sedation     insulin aspart (NOVOLOG) 100 UNIT/ML injection Inject 2 Units into the skin 3 (three) times daily with meals. Do not give if patient is not eating 10 mL 11   insulin glargine (LANTUS) 100 UNIT/ML injection Inject 11 Units into the skin in the morning.     Insulin Pen Needle 32G X 4 MM MISC Use as directed to inject insulin tidwc 100 each 11   Ipratropium-Albuterol (COMBIVENT) 20-100 MCG/ACT AERS respimat Inhale 1 puff into the lungs every 6 (six) hours as needed for wheezing. (Patient taking differently: Inhale 1 puff into the lungs every 6 (six) hours as needed (for COPD).)     isosorbide mononitrate (ISMO) 10 MG tablet Take 10 mg by mouth at bedtime.     Lancets (ACCU-CHEK SOFT TOUCH) lancets Use to check blood sugar 3 times daily. Dx: E10.9 300 each 3   loratadine (CLARITIN) 10 MG tablet Take 10 mg by mouth daily.     NOVOLOG FLEXPEN 100 UNIT/ML FlexPen Inject 0-8 Units  into the skin See admin instructions. Inject 0-8 units into the skin before lunch and supper, per sliding scale: if is BS 70-200 give 0 units, if BS is 201-250 give 2 units, if BS is 251-300 give 4 units, if BS is 301-350 give 6 units, if BS is 351-400 give 8 units. If blood sugar is greater than 400, call MD.     Nutritional Supplements (,FEEDING SUPPLEMENT, PROSOURCE PLUS) liquid Take 30 mLs by mouth 3 (three) times daily between meals. 900 mL 2   omeprazole (PRILOSEC) 20 MG capsule Take 20 mg by mouth every other day.     oxyCODONE (OXY IR/ROXICODONE) 5 MG immediate release tablet Take 1 tablet (5 mg total) by mouth every 6 (six) hours as needed for severe pain or breakthrough pain. 15 tablet 0   Pancrelipase, Lip-Prot-Amyl, (CREON) 24000-76000 units CPEP Take 1 capsule by mouth 3 (three) times daily. With 4-6 ounces of water     Skin Protectants, Misc. (MINERIN CREME EX) Apply 1 application  topically See admin instructions. Apply to both feet and legs at bedtime     Spacer/Aero-Holding Dorise Bullion Use as directed with combivent. Ok to dispense any spacer that works with the inhaler being rxed to pt. 1 each 1   TRULICITY 1.5 YI/9.4WN SOPN Inject 1.5 mg into the skin every Friday.     No current facility-administered medications for this visit.    PHYSICAL EXAM Vitals:   03/13/22 1258  BP: (!) 147/74  Pulse: 83  Resp: 20  Temp: 98.3 F (36.8 C)  SpO2: 98%  Weight: 155 lb (70.3 kg)  Height: _0  (1.88 m)   Chronically ill gentleman in no acute distress Regular rate and rhythm Unlabored breathing Right foot with brisk peroneal artery signal at the ankle. R TMA bandaged  PERTINENT LABORATORY AND RADIOLOGIC DATA  Most recent CBC    Latest Ref Rng & Units 01/28/2022    3:39 AM 01/27/2022    3:38 AM 01/26/2022    3:28 AM  CBC  WBC 4.0 - 10.5 K/uL 10.4  9.5  9.1   Hemoglobin 13.0 - 17.0 g/dL 8.2  8.1  8.1   Hematocrit 39.0 - 52.0 % 25.6  25.2  24.4   Platelets 150 - 400 K/uL  255  249  263      Most recent CMP    Latest Ref Rng & Units 01/28/2022    3:39 AM 01/27/2022    3:38 AM 01/26/2022    3:28 AM  CMP  Glucose 70 - 99 mg/dL 168  139  73   BUN 8 - 23 mg/dL _0 Creatinine 0.61 - 1.24 mg/dL 1.39  1.35  1.39   Sodium 135 - 145 mmol/L 134  134  136   Potassium 3.5 - 5.1 mmol/L 3.5  3.7  3.1   Chloride 98 - 111 mmol/L 106  106  107   CO2 22 - 32 mmol/L _1 Calcium 8.9 - 10.3 mg/dL 8.1  8.1  8.1   Total Protein 6.5 - 8.1 g/dL 7.0  7.0  7.1   Total Bilirubin 0.3 - 1.2 mg/dL 0.6  0.6  0.6   Alkaline Phos 38 - 126 U/L 71  69  74   AST 15 - 41 U/L _2 ALT 0 - 44 U/L _3 Renal function CrCl cannot be calculated (Patient's most recent lab result is older than the maximum 21 days allowed.).  Hgb A1c MFr Bld (%)  Date Value  12/10/2021 8.9 (H)    LDL Cholesterol  Date Value Ref Range Status  11/25/2021 46 0 - 99 mg/dL Final    Comment:           Total Cholesterol/HDL:CHD Risk Coronary Heart Disease Risk Table                     Men   Women  1/2 Average Risk   3.4   3.3  Average Risk       5.0   4.4  2 X Average Risk   9.6   7.1  3 X Average Risk  23.4   11.0        Use the calculated Patient Ratio above and the CHD Risk Table to determine the patient's CHD Risk.        ATP III CLASSIFICATION (LDL):  <100     mg/dL   Optimal  100-129  mg/dL   Near or Above                    Optimal  130-159  mg/dL   Borderline  160-189  mg/dL   High  >190     mg/dL   Very High Performed at [facility] [address] [place], [place] [address]      +-------+-----------+-----------+------------+------------+  ABI/TBIToday's ABIToday's TBIPrevious ABIPrevious TBI  +-------+-----------+-----------+------------+------------+  Right [place]         amputated  0.75        0.34          +-------+-----------+-----------+------------+------------+  Left  [place]         bandage    0.86        1.02           +-------+-----------+-----------+------------+------------+  Biphasic waveforms at ankle  RLE duplex shows no significant stenosis  [name], MD [facility] Phone Number: [phone] 03/13/2022  1:09 PM  Total time spent on preparing this encounter including chart review, data review, collecting history, examining the patient, coordinating care for this established patient, 20 minutes.  Portions of this report may have been transcribed using voice recognition software.  Every effort has been made to ensure accuracy; however, inadvertent computerized transcription errors may still be present.

## 2022-03-16 ENCOUNTER — Ambulatory Visit: Payer: Medicare Other | Admitting: Podiatry

## 2022-03-19 ENCOUNTER — Other Ambulatory Visit: Payer: Self-pay

## 2022-03-19 DIAGNOSIS — I739 Peripheral vascular disease, unspecified: Secondary | ICD-10-CM

## 2022-04-24 ENCOUNTER — Ambulatory Visit (INDEPENDENT_AMBULATORY_CARE_PROVIDER_SITE_OTHER): Payer: Medicare Other | Admitting: Podiatry

## 2022-04-24 DIAGNOSIS — Z89431 Acquired absence of right foot: Secondary | ICD-10-CM

## 2022-04-24 DIAGNOSIS — N1832 Chronic kidney disease, stage 3b: Secondary | ICD-10-CM | POA: Diagnosis not present

## 2022-04-24 DIAGNOSIS — Z794 Long term (current) use of insulin: Secondary | ICD-10-CM

## 2022-04-24 DIAGNOSIS — E1122 Type 2 diabetes mellitus with diabetic chronic kidney disease: Secondary | ICD-10-CM

## 2022-04-24 NOTE — Progress Notes (Signed)
Subjective:  Patient ID: Justin [name], male    DOB: [birth year],  MRN: [id number]  Chief Complaint  Patient presents with   Routine Post Op    DOS: 01/28/2022 Procedure: Right transmetatarsal amputation with Achilles tendon lengthening  88 y.o. male returns for post-op check.  Patient is doing well.  He is nonweightbearing to the right lower extremity no nausea fever chills vomiting bandages clean dry and intact.  Review of Systems: Negative except as noted in the HPI. Denies N/V/F/Ch.  Past Medical History:  Diagnosis Date   Acute metabolic encephalopathy [id number]   Arthritis    Diabetes mellitus    HTN (hypertension)    Hypercholesteremia    Iron deficiency    Pancreatitis, acute 06/02/2012   Diagnosed 05/2012    Personal history of colonic adenomas 04/15/2013   04/15/2013 2 diminutive polyps     Stroke ([place])    TIA (transient ischemic attack)     Current Outpatient Medications:    acetaminophen (TYLENOL) 325 MG tablet, Take 650 mg by mouth in the morning., Disp: , Rfl:    ADVAIR HFA 230-21 MCG/ACT inhaler, Inhale 2 puffs into the lungs every 12 (twelve) hours. Rinse mouth afterwards, Disp: , Rfl:    Alcohol Swabs PADS, Use to check blood sugar 3 times daily. Dx: E10.9, Disp: 300 each, Rfl: 3   Amino Acids-Protein Hydrolys (PRO-STAT) LIQD, Take 30 mLs by mouth 3 (three) times daily between meals., Disp: , Rfl:    aspirin 81 MG chewable tablet, Chew 81 mg by mouth daily., Disp: , Rfl:    atorvastatin (LIPITOR) 40 MG tablet, Take 40 mg by mouth at bedtime., Disp: , Rfl:    Blood Glucose Calibration (ACCU-CHEK AVIVA) SOLN, Use to check blood sugar 3 times daily. Dx: E10.9, Disp: 1 each, Rfl: 3   Blood Glucose Monitoring Suppl (ACCU-CHEK AVIVA PLUS) w/Device KIT, Use to check blood sugar 3 times daily. Dx: E10.9, Disp: 1 kit, Rfl: 0   clopidogrel (PLAVIX) 75 MG tablet, Take 1 tablet (75 mg total) by mouth daily with breakfast., Disp: , Rfl:    doxycycline (MONODOX) 100 MG  capsule, Take 100 mg by mouth 2 (two) times daily., Disp: , Rfl:    Emollient (AQUAPHOR ADV PROTECT HEALING) 41 % OINT, Apply 1 application  topically See admin instructions. Apply to feet/toes once a day after mixing with Lidocaine cream, Disp: , Rfl:    ferrous gluconate (FERGON) 324 MG tablet, Take 324 mg by mouth See admin instructions. Take 324 mg by mouth once a day on Mon/Tues/Wed/Thurs/Fri, Disp: , Rfl:    glucagon (GLUCAGON EMERGENCY) 1 MG injection, Inject 1 mg into the muscle once as needed. (Patient taking differently: Inject 1 mg into the muscle once as needed (for low blood sugar).), Disp: 1 each, Rfl: 12   glucose blood (ACCU-CHEK AVIVA PLUS) test strip, Use to check blood sugar 3 times daily. Dx: E10.9, Disp: 300 each, Rfl: 3   hydrOXYzine (ATARAX) 10 MG tablet, Take 10 mg by mouth See admin instructions. Take 10 mg by mouth two times a day and hold for sedation, Disp: , Rfl:    insulin aspart (NOVOLOG) 100 UNIT/ML injection, Inject 2 Units into the skin 3 (three) times daily with meals. Do not give if patient is not eating, Disp: 10 mL, Rfl: 11   insulin glargine (LANTUS) 100 UNIT/ML injection, Inject 11 Units into the skin in the morning., Disp: , Rfl:    Insulin Pen Needle 32G X 4 MM MISC,  Use as directed to inject insulin tidwc, Disp: 100 each, Rfl: 11   Ipratropium-Albuterol (COMBIVENT) 20-100 MCG/ACT AERS respimat, Inhale 1 puff into the lungs every 6 (six) hours as needed for wheezing. (Patient taking differently: Inhale 1 puff into the lungs every 6 (six) hours as needed (for COPD).), Disp: , Rfl:    isosorbide mononitrate (ISMO) 10 MG tablet, Take 10 mg by mouth at bedtime., Disp: , Rfl:    Lancets (ACCU-CHEK SOFT TOUCH) lancets, Use to check blood sugar 3 times daily. Dx: E10.9, Disp: 300 each, Rfl: 3   loratadine (CLARITIN) 10 MG tablet, Take 10 mg by mouth daily., Disp: , Rfl:    NOVOLOG FLEXPEN 100 UNIT/ML FlexPen, Inject 0-8 Units into the skin See admin instructions.  Inject 0-8 units into the skin before lunch and supper, per sliding scale: if is BS 70-200 give 0 units, if BS is 201-250 give 2 units, if BS is 251-300 give 4 units, if BS is 301-350 give 6 units, if BS is 351-400 give 8 units. If blood sugar is greater than 400, call MD., Disp: , Rfl:    Nutritional Supplements (,FEEDING SUPPLEMENT, PROSOURCE PLUS) liquid, Take 30 mLs by mouth 3 (three) times daily between meals., Disp: 900 mL, Rfl: 2   omeprazole (PRILOSEC) 20 MG capsule, Take 20 mg by mouth every other day., Disp: , Rfl:    oxyCODONE (OXY IR/ROXICODONE) 5 MG immediate release tablet, Take 1 tablet (5 mg total) by mouth every 6 (six) hours as needed for severe pain or breakthrough pain., Disp: 15 tablet, Rfl: 0   Pancrelipase, Lip-Prot-Amyl, (CREON) 24000-76000 units CPEP, Take 1 capsule by mouth 3 (three) times daily. With 4-6 ounces of water, Disp: , Rfl:    Skin Protectants, Misc. (MINERIN CREME EX), Apply 1 application  topically See admin instructions. Apply to both feet and legs at bedtime, Disp: , Rfl:    Spacer/Aero-Holding Dorise Bullion, Use as directed with combivent. Ok to dispense any spacer that works with the inhaler being rxed to pt., Disp: 1 each, Rfl: 1   TRULICITY 1.5 NW/2.9FA SOPN, Inject 1.5 mg into the skin every Friday., Disp: , Rfl:   Social History   Tobacco Use  Smoking Status Former   Years: 35.00   Types: Cigarettes   Quit date: 04/09/1990   Years since quitting: 32.0  Smokeless Tobacco Never    No Known Allergies Objective:  There were no vitals filed for this visit. There is no height or weight on file to calculate BMI. Constitutional Well developed. Well nourished.  Vascular Foot warm and well perfused. Capillary refill normal to all digits.   Neurologic Normal speech. Oriented to person, place, and time. Epicritic sensation to light touch grossly present bilaterally.  Dermatologic Skin healing well without signs of infection. Skin edges well coapted  without signs of infection.  Orthopedic: Tenderness to palpation noted about the surgical site.   Radiographs: None Assessment:   1. History of transmetatarsal amputation of right foot ([place])   2. Type 2 diabetes mellitus with stage 3b chronic kidney disease, with long-term current use of insulin ([place])     Plan:  Patient was evaluated and treated and all questions answered.  S/p foot surgery right -Progressing as expected post-operatively. -XR: See above -WB Status: Nonweightbearing in right lower extremity in cam boot -Sutures: Removed.  Continue Betadine wet-to-dry dressings are superficial dehiscence. -Overall it is healing well.  I am hopeful that I do not clinical visit the skin will be completely  reepithelialized and he will be officially discharged from my care during next visit  No follow-ups on file.

## 2022-05-22 ENCOUNTER — Ambulatory Visit (INDEPENDENT_AMBULATORY_CARE_PROVIDER_SITE_OTHER): Payer: Medicare Other | Admitting: Podiatry

## 2022-05-22 DIAGNOSIS — N1832 Chronic kidney disease, stage 3b: Secondary | ICD-10-CM

## 2022-05-22 DIAGNOSIS — Z794 Long term (current) use of insulin: Secondary | ICD-10-CM

## 2022-05-22 DIAGNOSIS — Z89431 Acquired absence of right foot: Secondary | ICD-10-CM

## 2022-05-22 DIAGNOSIS — E1122 Type 2 diabetes mellitus with diabetic chronic kidney disease: Secondary | ICD-10-CM | POA: Diagnosis not present

## 2022-05-22 NOTE — Progress Notes (Signed)
Subjective:  Patient ID: Justin [name], male    DOB: [birth year],  MRN: [id number]  Chief Complaint  Patient presents with   Routine Post Op    DOS: 01/28/2022 Procedure: Right transmetatarsal amputation with Achilles tendon lengthening  88 y.o. male returns for post-op check.  Patient is doing well.  He is nonweightbearing to the right lower extremity no nausea fever chills vomiting bandages clean dry and intact.  Review of Systems: Negative except as noted in the HPI. Denies N/V/F/Ch.  Past Medical History:  Diagnosis Date   Acute metabolic encephalopathy [id number]   Arthritis    Diabetes mellitus    HTN (hypertension)    Hypercholesteremia    Iron deficiency    Pancreatitis, acute 06/02/2012   Diagnosed 05/2012    Personal history of colonic adenomas 04/15/2013   04/15/2013 2 diminutive polyps     Stroke ([place])    TIA (transient ischemic attack)     Current Outpatient Medications:    acetaminophen (TYLENOL) 325 MG tablet, Take 650 mg by mouth in the morning., Disp: , Rfl:    ADVAIR HFA 230-21 MCG/ACT inhaler, Inhale 2 puffs into the lungs every 12 (twelve) hours. Rinse mouth afterwards, Disp: , Rfl:    Alcohol Swabs PADS, Use to check blood sugar 3 times daily. Dx: E10.9, Disp: 300 each, Rfl: 3   Amino Acids-Protein Hydrolys (PRO-STAT) LIQD, Take 30 mLs by mouth 3 (three) times daily between meals., Disp: , Rfl:    aspirin 81 MG chewable tablet, Chew 81 mg by mouth daily., Disp: , Rfl:    atorvastatin (LIPITOR) 40 MG tablet, Take 40 mg by mouth at bedtime., Disp: , Rfl:    Blood Glucose Calibration (ACCU-CHEK AVIVA) SOLN, Use to check blood sugar 3 times daily. Dx: E10.9, Disp: 1 each, Rfl: 3   Blood Glucose Monitoring Suppl (ACCU-CHEK AVIVA PLUS) w/Device KIT, Use to check blood sugar 3 times daily. Dx: E10.9, Disp: 1 kit, Rfl: 0   clopidogrel (PLAVIX) 75 MG tablet, Take 1 tablet (75 mg total) by mouth daily with breakfast., Disp: , Rfl:    doxycycline (MONODOX) 100 MG  capsule, Take 100 mg by mouth 2 (two) times daily., Disp: , Rfl:    Emollient (AQUAPHOR ADV PROTECT HEALING) 41 % OINT, Apply 1 application  topically See admin instructions. Apply to feet/toes once a day after mixing with Lidocaine cream, Disp: , Rfl:    ferrous gluconate (FERGON) 324 MG tablet, Take 324 mg by mouth See admin instructions. Take 324 mg by mouth once a day on Mon/Tues/Wed/Thurs/Fri, Disp: , Rfl:    glucagon (GLUCAGON EMERGENCY) 1 MG injection, Inject 1 mg into the muscle once as needed. (Patient taking differently: Inject 1 mg into the muscle once as needed (for low blood sugar).), Disp: 1 each, Rfl: 12   glucose blood (ACCU-CHEK AVIVA PLUS) test strip, Use to check blood sugar 3 times daily. Dx: E10.9, Disp: 300 each, Rfl: 3   hydrOXYzine (ATARAX) 10 MG tablet, Take 10 mg by mouth See admin instructions. Take 10 mg by mouth two times a day and hold for sedation, Disp: , Rfl:    insulin aspart (NOVOLOG) 100 UNIT/ML injection, Inject 2 Units into the skin 3 (three) times daily with meals. Do not give if patient is not eating, Disp: 10 mL, Rfl: 11   insulin glargine (LANTUS) 100 UNIT/ML injection, Inject 11 Units into the skin in the morning., Disp: , Rfl:    Insulin Pen Needle 32G X 4 MM MISC,  Use as directed to inject insulin tidwc, Disp: 100 each, Rfl: 11   Ipratropium-Albuterol (COMBIVENT) 20-100 MCG/ACT AERS respimat, Inhale 1 puff into the lungs every 6 (six) hours as needed for wheezing. (Patient taking differently: Inhale 1 puff into the lungs every 6 (six) hours as needed (for COPD).), Disp: , Rfl:    isosorbide mononitrate (ISMO) 10 MG tablet, Take 10 mg by mouth at bedtime., Disp: , Rfl:    Lancets (ACCU-CHEK SOFT TOUCH) lancets, Use to check blood sugar 3 times daily. Dx: E10.9, Disp: 300 each, Rfl: 3   loratadine (CLARITIN) 10 MG tablet, Take 10 mg by mouth daily., Disp: , Rfl:    NOVOLOG FLEXPEN 100 UNIT/ML FlexPen, Inject 0-8 Units into the skin See admin instructions.  Inject 0-8 units into the skin before lunch and supper, per sliding scale: if is BS 70-200 give 0 units, if BS is 201-250 give 2 units, if BS is 251-300 give 4 units, if BS is 301-350 give 6 units, if BS is 351-400 give 8 units. If blood sugar is greater than 400, call MD., Disp: , Rfl:    Nutritional Supplements (,FEEDING SUPPLEMENT, PROSOURCE PLUS) liquid, Take 30 mLs by mouth 3 (three) times daily between meals., Disp: 900 mL, Rfl: 2   omeprazole (PRILOSEC) 20 MG capsule, Take 20 mg by mouth every other day., Disp: , Rfl:    oxyCODONE (OXY IR/ROXICODONE) 5 MG immediate release tablet, Take 1 tablet (5 mg total) by mouth every 6 (six) hours as needed for severe pain or breakthrough pain., Disp: 15 tablet, Rfl: 0   Pancrelipase, Lip-Prot-Amyl, (CREON) 24000-76000 units CPEP, Take 1 capsule by mouth 3 (three) times daily. With 4-6 ounces of water, Disp: , Rfl:    Skin Protectants, Misc. (MINERIN CREME EX), Apply 1 application  topically See admin instructions. Apply to both feet and legs at bedtime, Disp: , Rfl:    Spacer/Aero-Holding Dorise Bullion, Use as directed with combivent. Ok to dispense any spacer that works with the inhaler being rxed to pt., Disp: 1 each, Rfl: 1   TRULICITY 1.5 0000000 SOPN, Inject 1.5 mg into the skin every Friday., Disp: , Rfl:   Social History   Tobacco Use  Smoking Status Former   Years: 35.00   Types: Cigarettes   Quit date: 04/09/1990   Years since quitting: 32.0  Smokeless Tobacco Never    No Known Allergies Objective:  There were no vitals filed for this visit. There is no height or weight on file to calculate BMI. Constitutional Well developed. Well nourished.  Vascular Foot warm and well perfused. Capillary refill normal to all digits.   Neurologic Normal speech. Oriented to person, place, and time. Epicritic sensation to light touch grossly present bilaterally.  Dermatologic Capably reepithelialized.  No further signs of .  Good range of motion  noted at the ankle joint.  Orthopedic: Tenderness to palpation noted about the surgical site.   Radiographs: None Assessment:   1. History of transmetatarsal amputation of right foot ([place])   2. Type 2 diabetes mellitus with stage 3b chronic kidney disease, with long-term current use of insulin ([place])     Plan:  Patient was evaluated and treated and all questions answered.  S/p foot surgery right -Clinically reepithelialized and the TMA site has completely healed.  At this time patient is officially discharged from my care.  History of TMA -Given the nature of the TMA that he has undergone he will benefit from diabetic shoes with a toe filler.  I discussed with patient he states understand like to proceed with getting diabetic shoes -He was casted for diabetic shoes  No follow-ups on file.

## 2022-07-30 ENCOUNTER — Telehealth: Payer: Self-pay | Admitting: Podiatry

## 2022-07-30 NOTE — Telephone Encounter (Signed)
Tried to reach patient to schedule picking up his diabetic shoes , no answer no vm

## 2022-08-09 NOTE — Telephone Encounter (Signed)
Tried to reach patient to pick up diabetic shoes, tried spouses number and daughter no answer .

## 2022-09-05 ENCOUNTER — Other Ambulatory Visit: Payer: Medicare Other

## 2022-09-06 ENCOUNTER — Encounter: Payer: Self-pay | Admitting: Podiatry

## 2022-09-06 NOTE — Telephone Encounter (Signed)
Third attempt to reach pt, number would not go through , will send letter .   Paperwork expires  10/29/22

## 2022-09-07 NOTE — Telephone Encounter (Addendum)
Tried to reach patient for last time no asnwer no vm  . Will cancel order and send shoes back.

## 2022-09-10 NOTE — Progress Notes (Unsigned)
[facility] [place]  ASSESSMENT / PLAN: Justin [name] is a 88 y.o. male with atherosclerosis of native arteries of right lower extremity s/p endovascular intervention and R TMA 01/28/22 by Dr. Allena Katz.  Recommend the following which can slow the progression of atherosclerosis and reduce the risk of major adverse cardiac / limb events:  Complete cessation from all tobacco products. Blood glucose control with goal A1c < 7%. Blood pressure control with goal blood pressure < 140/90 mmHg. Lipid reduction therapy with goal LDL-C <100 mg/dL (<78 if symptomatic from PAD).  Aspirin 81mg  PO QD.  Plavix 75mg  PO QD. Atorvastatin 40-80mg  PO QD (or other "high intensity" statin therapy).  Doing well overall. TMA healing. Encouraged continued follow up with Dr. Allena Katz. Follow up with me in 6 months with repeat non-invasive testing.  CHIEF COMPLAINT: Ulceration of right foot  HISTORY OF PRESENT ILLNESS: Justin [name] is a 88 y.o. male who returns to clinic for follow-up after angiogram 11/24/2021 done for atherosclerosis of the right lower extremity with gangrene.  He ultimately underwent a successful stenting of the right popliteal artery, angioplasty of the tibioperoneal trunk and peroneal artery.  Fortunately has peroneal only runoff and severely disadvantaged pedal circulation.  His right foot has continued to deteriorate.  He is in a wheelchair today.  He is getting meticulous wound care by the wound care center and Dr. Annamary Rummage.  03/13/22: patient returns to clinic for follow up. He underwent R TMA with Dr. Allena Katz 10/22. He tolerated this well, and is healing well. No new complaints.   VASCULAR SURGICAL HISTORY:  11/24/21 - R popliteal drug coated angioplasty, right tibioperoneal trunk angioplasty, right peroneal artery angioplasty  Past Medical History:  Diagnosis Date   Acute metabolic encephalopathy 05/24/2021   Arthritis    Diabetes mellitus    HTN (hypertension)     Hypercholesteremia    Iron deficiency    Pancreatitis, acute 06/02/2012   Diagnosed 05/2012    Personal history of colonic adenomas 04/15/2013   04/15/2013 2 diminutive polyps     Stroke [facility])    TIA (transient ischemic attack)     Past Surgical History:  Procedure Laterality Date   ABDOMINAL AORTOGRAM W/LOWER EXTREMITY Right 11/24/2021   Procedure: ABDOMINAL AORTOGRAM W/LOWER EXTREMITY;  Surgeon: Leonie Douglas, MD;  Location: [facility];  Service: Cardiovascular;  Laterality: Right;   AMPUTATION TOE Right 11/26/2021   Procedure: AMPUTATION TOE;  Surgeon: Vivi Barrack, DPM;  Location: [facility] OR;  Service: Podiatry;  Laterality: Right;   ARTERY BIOPSY  02/26/2011   Procedure: BIOPSY TEMPORAL ARTERY;  Surgeon: Iona Coach, MD;  Location:  SURGERY CENTER;  Service: General;  Laterality: Left;  Left temporal artery biospy   BACK SURGERY  1985   CHOLECYSTECTOMY     COLONOSCOPY     EUS     NECK SURGERY  1985   PERIPHERAL VASCULAR BALLOON ANGIOPLASTY Right 11/24/2021   Procedure: PERIPHERAL VASCULAR BALLOON ANGIOPLASTY;  Surgeon: Leonie Douglas, MD;  Location: [facility];  Service: Cardiovascular;  Laterality: Right;  POPLITEAL AND PERONEAL   TEMPORAL ARTERY BIOPSY / LIGATION  02/26/2011   left side   TRANSMETATARSAL AMPUTATION Right 01/28/2022   Procedure: TRANSMETATARSAL AMPUTATION; ACHILLES TENDON LENGTHENING;  Surgeon: Candelaria Stagers, DPM;  Location: [facility] OR[facility];  Service: Podiatry;  Laterality: Right;  R foot Transmetatarsal amputation, possible tendo achilles lengthening   UPPER GASTROINTESTINAL ENDOSCOPY      Family History  Problem  Relation Age of Onset   Heart disease Mother    Amblyopia Father    Diabetes Sister        x 2   Heart attack Sister     Social History   Socioeconomic History   Marital status: Married    Spouse name: Not on file   Number of children: 6   Years of education: Not on file   Highest education level: Not on file   Occupational History   Not on file  Tobacco Use   Smoking status: Former    Years: 35    Types: Cigarettes    Quit date: 04/09/1990    Years since quitting: 32.4   Smokeless tobacco: Never  Substance and Sexual Activity   Alcohol use: No    Comment: "per pt stopped drinking in the 90's"   Drug use: No   Sexual activity: Not on file  Other Topics Concern   Not on file  Social History Narrative   Not on file   Social Determinants of Health   Financial Resource Strain: Not on file  Food Insecurity: No Food Insecurity (01/25/2022)   Hunger Vital Sign    Worried About Running Out of Food in the Last Year: Never true    Ran Out of Food in the Last Year: Never true  Transportation Needs: No Transportation Needs (01/25/2022)   PRAPARE - Administrator, Civil Service (Medical): No    Lack of Transportation (Non-Medical): No  Physical Activity: Not on file  Stress: Not on file  Social Connections: Not on file  Intimate Partner Violence: Not At Risk (01/25/2022)   Humiliation, Afraid, Rape, and Kick questionnaire    Fear of Current or Ex-Partner: No    Emotionally Abused: No    Physically Abused: No    Sexually Abused: No    No Known Allergies  Current Outpatient Medications  Medication Sig Dispense Refill   acetaminophen (TYLENOL) 325 MG tablet Take 650 mg by mouth in the morning.     ADVAIR HFA 230-21 MCG/ACT inhaler Inhale 2 puffs into the lungs every 12 (twelve) hours. Rinse mouth afterwards     Alcohol Swabs PADS Use to check blood sugar 3 times daily. Dx: E10.9 300 each 3   Amino Acids-Protein Hydrolys (PRO-STAT) LIQD Take 30 mLs by mouth 3 (three) times daily between meals.     aspirin 81 MG chewable tablet Chew 81 mg by mouth daily.     atorvastatin (LIPITOR) 40 MG tablet Take 40 mg by mouth at bedtime.     Blood Glucose Calibration (ACCU-CHEK AVIVA) SOLN Use to check blood sugar 3 times daily. Dx: E10.9 1 each 3   Blood Glucose Monitoring Suppl (ACCU-CHEK  AVIVA PLUS) w/Device KIT Use to check blood sugar 3 times daily. Dx: E10.9 1 kit 0   clopidogrel (PLAVIX) 75 MG tablet Take 1 tablet (75 mg total) by mouth daily with breakfast.     doxycycline (MONODOX) 100 MG capsule Take 100 mg by mouth 2 (two) times daily.     Emollient (AQUAPHOR ADV PROTECT HEALING) 41 % OINT Apply 1 application  topically See admin instructions. Apply to feet/toes once a day after mixing with Lidocaine cream     ferrous gluconate (FERGON) 324 MG tablet Take 324 mg by mouth See admin instructions. Take 324 mg by mouth once a day on Mon/Tues/Wed/Thurs/Fri     glucagon (GLUCAGON EMERGENCY) 1 MG injection Inject 1 mg into the muscle once as needed. (Patient  taking differently: Inject 1 mg into the muscle once as needed (for low blood sugar).) 1 each 12   glucose blood (ACCU-CHEK AVIVA PLUS) test strip Use to check blood sugar 3 times daily. Dx: E10.9 300 each 3   hydrOXYzine (ATARAX) 10 MG tablet Take 10 mg by mouth See admin instructions. Take 10 mg by mouth two times a day and hold for sedation     insulin aspart (NOVOLOG) 100 UNIT/ML injection Inject 2 Units into the skin 3 (three) times daily with meals. Do not give if patient is not eating 10 mL 11   insulin glargine (LANTUS) 100 UNIT/ML injection Inject 11 Units into the skin in the morning.     Insulin Pen Needle 32G X 4 MM MISC Use as directed to inject insulin tidwc 100 each 11   Ipratropium-Albuterol (COMBIVENT) 20-100 MCG/ACT AERS respimat Inhale 1 puff into the lungs every 6 (six) hours as needed for wheezing. (Patient taking differently: Inhale 1 puff into the lungs every 6 (six) hours as needed (for COPD).)     isosorbide mononitrate (ISMO) 10 MG tablet Take 10 mg by mouth at bedtime.     Lancets (ACCU-CHEK SOFT TOUCH) lancets Use to check blood sugar 3 times daily. Dx: E10.9 300 each 3   loratadine (CLARITIN) 10 MG tablet Take 10 mg by mouth daily.     NOVOLOG FLEXPEN 100 UNIT/ML FlexPen Inject 0-8 Units into the skin  See admin instructions. Inject 0-8 units into the skin before lunch and supper, per sliding scale: if is BS 70-200 give 0 units, if BS is 201-250 give 2 units, if BS is 251-300 give 4 units, if BS is 301-350 give 6 units, if BS is 351-400 give 8 units. If blood sugar is greater than 400, call MD.     Nutritional Supplements (,FEEDING SUPPLEMENT, PROSOURCE PLUS) liquid Take 30 mLs by mouth 3 (three) times daily between meals. 900 mL 2   omeprazole (PRILOSEC) 20 MG capsule Take 20 mg by mouth every other day.     oxyCODONE (OXY IR/ROXICODONE) 5 MG immediate release tablet Take 1 tablet (5 mg total) by mouth every 6 (six) hours as needed for severe pain or breakthrough pain. 15 tablet 0   Pancrelipase, Lip-Prot-Amyl, (CREON) 24000-76000 units CPEP Take 1 capsule by mouth 3 (three) times daily. With 4-6 ounces of water     Skin Protectants, Misc. (MINERIN CREME EX) Apply 1 application  topically See admin instructions. Apply to both feet and legs at bedtime     Spacer/Aero-Holding Rudean Curt Use as directed with combivent. Ok to dispense any spacer that works with the inhaler being rxed to pt. 1 each 1   TRULICITY 1.5 MG/0.5ML SOPN Inject 1.5 mg into the skin every Friday.     No current facility-administered medications for this visit.    PHYSICAL EXAM There were no vitals filed for this visit.  Chronically ill gentleman in no acute distress Regular rate and rhythm Unlabored breathing Right foot with brisk peroneal artery signal at the ankle. R TMA bandaged  PERTINENT LABORATORY AND RADIOLOGIC DATA  Most recent CBC    Latest Ref Rng & Units 01/28/2022    3:39 AM 01/27/2022    3:38 AM 01/26/2022    3:28 AM  CBC  WBC 4.0 - 10.5 K/uL 10.4  9.5  9.1   Hemoglobin 13.0 - 17.0 g/dL 8.2  8.1  8.1   Hematocrit 39.0 - 52.0 % 25.6  25.2  24.4   Platelets  150 - 400 K/uL 255  249  263      Most recent CMP    Latest Ref Rng & Units 01/28/2022    3:39 AM 01/27/2022    3:38 AM 01/26/2022     3:28 AM  CMP  Glucose 70 - 99 mg/dL 161  096  73   BUN 8 - 23 mg/dL 26  26  24    Creatinine 0.61 - 1.24 mg/dL 0.45  4.09  8.11   Sodium 135 - 145 mmol/L 134  134  136   Potassium 3.5 - 5.1 mmol/L 3.5  3.7  3.1   Chloride 98 - 111 mmol/L 106  106  107   CO2 22 - 32 mmol/L 22  23  24    Calcium 8.9 - 10.3 mg/dL 8.1  8.1  8.1   Total Protein 6.5 - 8.1 g/dL 7.0  7.0  7.1   Total Bilirubin 0.3 - 1.2 mg/dL 0.6  0.6  0.6   Alkaline Phos 38 - 126 U/L 71  69  74   AST 15 - 41 U/L 17  15  17    ALT 0 - 44 U/L 8  10  11      Renal function CrCl cannot be calculated (Patient's most recent lab result is older than the maximum 21 days allowed.).  Hgb A1c MFr Bld (%)  Date Value  12/10/2021 8.9 (H)    LDL Cholesterol  Date Value Ref Range Status  11/25/2021 46 0 - 99 mg/dL Final    Comment:           Total Cholesterol/HDL:CHD Risk Coronary Heart Disease Risk Table                     Men   Women  1/2 Average Risk   3.4   3.3  Average Risk       5.0   4.4  2 X Average Risk   9.6   7.1  3 X Average Risk  23.4   11.0        Use the calculated Patient Ratio above and the CHD Risk Table to determine the patient's CHD Risk.        ATP III CLASSIFICATION (LDL):  <100     mg/dL   Optimal  914-782  mg/dL   Near or Above                    Optimal  130-159  mg/dL   Borderline  956-213  mg/dL   High  >086     mg/dL   Very High Performed at [facility], [address] [place], [place] [address]      +-------+-----------+-----------+------------+------------+  ABI/TBIToday's ABIToday's TBIPrevious ABIPrevious TBI  +-------+-----------+-----------+------------+------------+  Right [place]         amputated  0.75        0.34          +-------+-----------+-----------+------------+------------+  Left  [place]         bandage    0.86        1.02          +-------+-----------+-----------+------------+------------+  Biphasic waveforms at ankle  RLE duplex shows no significant  stenosis  Rande Brunt. Lenell Antu, MD [facility] Phone Number: [phone] 09/10/2022 7:30 PM  Total time spent on preparing this encounter including chart review, data review, collecting history, examining the patient, coordinating care for this established patient, 20 minutes.  Portions of this report may have been transcribed using voice recognition software.  Every effort has been made to ensure accuracy; however, inadvertent computerized transcription errors may still be present.

## 2022-09-11 ENCOUNTER — Ambulatory Visit (HOSPITAL_COMMUNITY)
Admission: RE | Admit: 2022-09-11 | Discharge: 2022-09-11 | Disposition: A | Discharge status: Home / Self Care | Payer: Medicare Other | Source: Ambulatory Visit | Attending: Vascular Surgery | Admitting: Vascular Surgery

## 2022-09-11 ENCOUNTER — Ambulatory Visit (INDEPENDENT_AMBULATORY_CARE_PROVIDER_SITE_OTHER)
Admission: RE | Admit: 2022-09-11 | Discharge: 2022-09-11 | Disposition: A | Discharge status: Home / Self Care | Payer: Medicare Other | Source: Ambulatory Visit | Attending: Vascular Surgery | Admitting: Vascular Surgery

## 2022-09-11 ENCOUNTER — Ambulatory Visit (INDEPENDENT_AMBULATORY_CARE_PROVIDER_SITE_OTHER): Payer: Medicare Other | Admitting: Vascular Surgery

## 2022-09-11 VITALS — BP 139/70 | HR 68 | Temp 98.0°F | Resp 20 | Ht 74.0 in | Wt 155.0 lb

## 2022-09-11 DIAGNOSIS — I739 Peripheral vascular disease, unspecified: Secondary | ICD-10-CM

## 2022-09-11 LAB — VAS US ABI WITH/WO TBI

## 2022-09-14 ENCOUNTER — Ambulatory Visit (INDEPENDENT_AMBULATORY_CARE_PROVIDER_SITE_OTHER): Payer: Medicare Other | Admitting: Podiatry

## 2022-09-14 DIAGNOSIS — Z89431 Acquired absence of right foot: Secondary | ICD-10-CM

## 2022-09-14 DIAGNOSIS — Z794 Long term (current) use of insulin: Secondary | ICD-10-CM | POA: Diagnosis not present

## 2022-09-14 DIAGNOSIS — N1832 Chronic kidney disease, stage 3b: Secondary | ICD-10-CM

## 2022-09-14 DIAGNOSIS — E1122 Type 2 diabetes mellitus with diabetic chronic kidney disease: Secondary | ICD-10-CM | POA: Diagnosis not present

## 2022-09-14 NOTE — Progress Notes (Signed)
Subjective:  Patient ID: Justin [name], male    DOB: 08/30/1934,  MRN: [id number]  No chief complaint on file.   DOS: 01/28/2022 Procedure: Right transmetatarsal amputation with Achilles tendon lengthening  88 y.o. male returns for post-op check.  Patient is doing well.  He is nonweightbearing to the right lower extremity no nausea fever chills vomiting bandages clean dry and intact.  Review of Systems: Negative except as noted in the HPI. Denies N/V/F/Ch.  Past Medical History:  Diagnosis Date   Acute metabolic encephalopathy 05/24/2021   Arthritis    Diabetes mellitus    HTN (hypertension)    Hypercholesteremia    Iron deficiency    Pancreatitis, acute 06/02/2012   Diagnosed 05/2012    Personal history of colonic adenomas 04/15/2013   04/15/2013 2 diminutive polyps     Stroke ([name])    TIA (transient ischemic attack)     Current Outpatient Medications:    acetaminophen (TYLENOL) 325 MG tablet, Take 650 mg by mouth in the morning., Disp: , Rfl:    ADVAIR HFA 230-21 MCG/ACT inhaler, Inhale 2 puffs into the lungs every 12 (twelve) hours. Rinse mouth afterwards, Disp: , Rfl:    Alcohol Swabs PADS, Use to check blood sugar 3 times daily. Dx: E10.9, Disp: 300 each, Rfl: 3   Amino Acids-Protein Hydrolys (PRO-STAT) LIQD, Take 30 mLs by mouth 3 (three) times daily between meals., Disp: , Rfl:    aspirin 81 MG chewable tablet, Chew 81 mg by mouth daily., Disp: , Rfl:    atorvastatin (LIPITOR) 40 MG tablet, Take 40 mg by mouth at bedtime., Disp: , Rfl:    Blood Glucose Calibration (ACCU-CHEK AVIVA) SOLN, Use to check blood sugar 3 times daily. Dx: E10.9, Disp: 1 each, Rfl: 3   Blood Glucose Monitoring Suppl (ACCU-CHEK AVIVA PLUS) w/Device KIT, Use to check blood sugar 3 times daily. Dx: E10.9, Disp: 1 kit, Rfl: 0   clopidogrel (PLAVIX) 75 MG tablet, Take 1 tablet (75 mg total) by mouth daily with breakfast., Disp: , Rfl:    doxycycline (MONODOX) 100 MG capsule, Take 100 mg by mouth 2 (two)  times daily., Disp: , Rfl:    Emollient (AQUAPHOR ADV PROTECT HEALING) 41 % OINT, Apply 1 application  topically See admin instructions. Apply to feet/toes once a day after mixing with Lidocaine cream, Disp: , Rfl:    ferrous gluconate (FERGON) 324 MG tablet, Take 324 mg by mouth See admin instructions. Take 324 mg by mouth once a day on Mon/Tues/Wed/Thurs/Fri, Disp: , Rfl:    glucagon (GLUCAGON EMERGENCY) 1 MG injection, Inject 1 mg into the muscle once as needed. (Patient taking differently: Inject 1 mg into the muscle once as needed (for low blood sugar).), Disp: 1 each, Rfl: 12   glucose blood (ACCU-CHEK AVIVA PLUS) test strip, Use to check blood sugar 3 times daily. Dx: E10.9, Disp: 300 each, Rfl: 3   hydrOXYzine (ATARAX) 10 MG tablet, Take 10 mg by mouth See admin instructions. Take 10 mg by mouth two times a day and hold for sedation, Disp: , Rfl:    insulin aspart (NOVOLOG) 100 UNIT/ML injection, Inject 2 Units into the skin 3 (three) times daily with meals. Do not give if patient is not eating, Disp: 10 mL, Rfl: 11   insulin glargine (LANTUS) 100 UNIT/ML injection, Inject 11 Units into the skin in the morning., Disp: , Rfl:    Insulin Pen Needle 32G X 4 MM MISC, Use as directed to inject insulin tidwc,  Disp: 100 each, Rfl: 11   Ipratropium-Albuterol (COMBIVENT) 20-100 MCG/ACT AERS respimat, Inhale 1 puff into the lungs every 6 (six) hours as needed for wheezing. (Patient taking differently: Inhale 1 puff into the lungs every 6 (six) hours as needed (for COPD).), Disp: , Rfl:    isosorbide mononitrate (ISMO) 10 MG tablet, Take 10 mg by mouth at bedtime., Disp: , Rfl:    Lancets (ACCU-CHEK SOFT TOUCH) lancets, Use to check blood sugar 3 times daily. Dx: E10.9, Disp: 300 each, Rfl: 3   loratadine (CLARITIN) 10 MG tablet, Take 10 mg by mouth daily., Disp: , Rfl:    NOVOLOG FLEXPEN 100 UNIT/ML FlexPen, Inject 0-8 Units into the skin See admin instructions. Inject 0-8 units into the skin before lunch  and supper, per sliding scale: if is BS 70-200 give 0 units, if BS is 201-250 give 2 units, if BS is 251-300 give 4 units, if BS is 301-350 give 6 units, if BS is 351-400 give 8 units. If blood sugar is greater than 400, call MD., Disp: , Rfl:    Nutritional Supplements (,FEEDING SUPPLEMENT, PROSOURCE PLUS) liquid, Take 30 mLs by mouth 3 (three) times daily between meals., Disp: 900 mL, Rfl: 2   omeprazole (PRILOSEC) 20 MG capsule, Take 20 mg by mouth every other day., Disp: , Rfl:    oxyCODONE (OXY IR/ROXICODONE) 5 MG immediate release tablet, Take 1 tablet (5 mg total) by mouth every 6 (six) hours as needed for severe pain or breakthrough pain., Disp: 15 tablet, Rfl: 0   Pancrelipase, Lip-Prot-Amyl, (CREON) 24000-76000 units CPEP, Take 1 capsule by mouth 3 (three) times daily. With 4-6 ounces of water, Disp: , Rfl:    Skin Protectants, Misc. (MINERIN CREME EX), Apply 1 application  topically See admin instructions. Apply to both feet and legs at bedtime, Disp: , Rfl:    Spacer/Aero-Holding Rudean Curt, Use as directed with combivent. Ok to dispense any spacer that works with the inhaler being rxed to pt., Disp: 1 each, Rfl: 1   TRULICITY 1.5 MG/0.5ML SOPN, Inject 1.5 mg into the skin every Friday., Disp: , Rfl:   Social History   Tobacco Use  Smoking Status Former   Years: 35   Types: Cigarettes   Quit date: 04/09/1990   Years since quitting: 32.4  Smokeless Tobacco Never    No Known Allergies Objective:  There were no vitals filed for this visit. There is no height or weight on file to calculate BMI. Constitutional Well developed. Well nourished.  Vascular Foot warm and well perfused. Capillary refill normal to all digits.   Neurologic Normal speech. Oriented to person, place, and time. Epicritic sensation to light touch grossly present bilaterally.  Dermatologic Capably reepithelialized.  No further signs of .  Good range of motion noted at the ankle joint.  Orthopedic: Tenderness  to palpation noted about the surgical site.   Radiographs: None Assessment:   No diagnosis found.   Plan:  Patient was evaluated and treated and all questions answered.  S/p foot surgery right -Clinically reepithelialized and the TMA site has completely healed.  At this time patient is officially discharged from my care.  No other open wounds or lesions noted  History of TMA -Given the nature of the TMA that he has undergone he will benefit from diabetic shoes with a toe filler.  I discussed with patient he states understand like to proceed with getting diabetic shoes -He is awaiting for his diabetic shoes  No follow-ups on file.

## 2022-10-10 ENCOUNTER — Ambulatory Visit: Payer: Medicare Other | Admitting: Podiatry

## 2022-10-10 ENCOUNTER — Ambulatory Visit (INDEPENDENT_AMBULATORY_CARE_PROVIDER_SITE_OTHER): Payer: Medicare Other | Admitting: Podiatry

## 2022-10-10 DIAGNOSIS — E1122 Type 2 diabetes mellitus with diabetic chronic kidney disease: Secondary | ICD-10-CM

## 2022-10-10 DIAGNOSIS — N1832 Chronic kidney disease, stage 3b: Secondary | ICD-10-CM

## 2022-10-10 DIAGNOSIS — Z89431 Acquired absence of right foot: Secondary | ICD-10-CM

## 2022-10-10 DIAGNOSIS — Z794 Long term (current) use of insulin: Secondary | ICD-10-CM

## 2022-10-10 NOTE — Progress Notes (Signed)
Patient presented today to pick up his replacement shoes , order was not placed for the brooks velcro in black 4e.  I reordered the shoes for the patient and let them know we will call him when they come in .,

## 2022-11-06 ENCOUNTER — Inpatient Hospital Stay (HOSPITAL_COMMUNITY)
Admission: EM | Admit: 2022-11-06 | Discharge: 2022-11-09 | DRG: 689 | Disposition: A | Discharge status: Skilled Nursing Facility | Payer: Medicare Other | Source: Skilled Nursing Facility | Attending: Internal Medicine | Admitting: Internal Medicine

## 2022-11-06 ENCOUNTER — Emergency Department (HOSPITAL_COMMUNITY): Payer: Medicare Other

## 2022-11-06 ENCOUNTER — Encounter (HOSPITAL_COMMUNITY): Payer: Self-pay | Admitting: Emergency Medicine

## 2022-11-06 ENCOUNTER — Other Ambulatory Visit: Payer: Self-pay

## 2022-11-06 DIAGNOSIS — Z7985 Long-term (current) use of injectable non-insulin antidiabetic drugs: Secondary | ICD-10-CM

## 2022-11-06 DIAGNOSIS — I69351 Hemiplegia and hemiparesis following cerebral infarction affecting right dominant side: Secondary | ICD-10-CM | POA: Diagnosis not present

## 2022-11-06 DIAGNOSIS — I2510 Atherosclerotic heart disease of native coronary artery without angina pectoris: Secondary | ICD-10-CM | POA: Diagnosis present

## 2022-11-06 DIAGNOSIS — E860 Dehydration: Secondary | ICD-10-CM | POA: Diagnosis present

## 2022-11-06 DIAGNOSIS — Z7951 Long term (current) use of inhaled steroids: Secondary | ICD-10-CM | POA: Diagnosis not present

## 2022-11-06 DIAGNOSIS — Z87891 Personal history of nicotine dependence: Secondary | ICD-10-CM

## 2022-11-06 DIAGNOSIS — Z7902 Long term (current) use of antithrombotics/antiplatelets: Secondary | ICD-10-CM

## 2022-11-06 DIAGNOSIS — I129 Hypertensive chronic kidney disease with stage 1 through stage 4 chronic kidney disease, or unspecified chronic kidney disease: Secondary | ICD-10-CM | POA: Diagnosis present

## 2022-11-06 DIAGNOSIS — E7800 Pure hypercholesterolemia, unspecified: Secondary | ICD-10-CM | POA: Diagnosis present

## 2022-11-06 DIAGNOSIS — N3000 Acute cystitis without hematuria: Secondary | ICD-10-CM

## 2022-11-06 DIAGNOSIS — N390 Urinary tract infection, site not specified: Secondary | ICD-10-CM | POA: Diagnosis present

## 2022-11-06 DIAGNOSIS — N1832 Chronic kidney disease, stage 3b: Secondary | ICD-10-CM

## 2022-11-06 DIAGNOSIS — Z794 Long term (current) use of insulin: Secondary | ICD-10-CM | POA: Diagnosis not present

## 2022-11-06 DIAGNOSIS — Z79899 Other long term (current) drug therapy: Secondary | ICD-10-CM

## 2022-11-06 DIAGNOSIS — E1122 Type 2 diabetes mellitus with diabetic chronic kidney disease: Secondary | ICD-10-CM

## 2022-11-06 DIAGNOSIS — B964 Proteus (mirabilis) (morganii) as the cause of diseases classified elsewhere: Secondary | ICD-10-CM | POA: Diagnosis present

## 2022-11-06 DIAGNOSIS — S81802A Unspecified open wound, left lower leg, initial encounter: Secondary | ICD-10-CM

## 2022-11-06 DIAGNOSIS — G928 Other toxic encephalopathy: Secondary | ICD-10-CM | POA: Diagnosis present

## 2022-11-06 DIAGNOSIS — N179 Acute kidney failure, unspecified: Secondary | ICD-10-CM | POA: Diagnosis present

## 2022-11-06 DIAGNOSIS — Z89421 Acquired absence of other right toe(s): Secondary | ICD-10-CM | POA: Diagnosis not present

## 2022-11-06 DIAGNOSIS — Z8249 Family history of ischemic heart disease and other diseases of the circulatory system: Secondary | ICD-10-CM

## 2022-11-06 DIAGNOSIS — Z7982 Long term (current) use of aspirin: Secondary | ICD-10-CM | POA: Diagnosis not present

## 2022-11-06 DIAGNOSIS — I10 Essential (primary) hypertension: Secondary | ICD-10-CM | POA: Diagnosis not present

## 2022-11-06 DIAGNOSIS — Z833 Family history of diabetes mellitus: Secondary | ICD-10-CM

## 2022-11-06 DIAGNOSIS — I739 Peripheral vascular disease, unspecified: Secondary | ICD-10-CM | POA: Diagnosis not present

## 2022-11-06 DIAGNOSIS — E119 Type 2 diabetes mellitus without complications: Secondary | ICD-10-CM

## 2022-11-06 DIAGNOSIS — G9341 Metabolic encephalopathy: Secondary | ICD-10-CM

## 2022-11-06 DIAGNOSIS — E1151 Type 2 diabetes mellitus with diabetic peripheral angiopathy without gangrene: Secondary | ICD-10-CM | POA: Diagnosis present

## 2022-11-06 DIAGNOSIS — Z66 Do not resuscitate: Secondary | ICD-10-CM | POA: Diagnosis present

## 2022-11-06 DIAGNOSIS — D631 Anemia in chronic kidney disease: Secondary | ICD-10-CM | POA: Diagnosis present

## 2022-11-06 DIAGNOSIS — S81809A Unspecified open wound, unspecified lower leg, initial encounter: Secondary | ICD-10-CM

## 2022-11-06 LAB — CBG MONITORING, ED: Glucose-Capillary: 169 mg/dL — ABNORMAL HIGH (ref 70–99)

## 2022-11-06 LAB — BASIC METABOLIC PANEL
Anion gap: 9 (ref 5–15)
BUN: 37 mg/dL — ABNORMAL HIGH (ref 8–23)
CO2: 23 mmol/L (ref 22–32)
Calcium: 8.5 mg/dL — ABNORMAL LOW (ref 8.9–10.3)
Chloride: 102 mmol/L (ref 98–111)
Creatinine, Ser: 2.19 mg/dL — ABNORMAL HIGH (ref 0.61–1.24)
GFR, Estimated: 29 mL/min — ABNORMAL LOW (ref >60)
Glucose, Bld: 221 mg/dL — ABNORMAL HIGH (ref 70–99)
Potassium: 3.9 mmol/L (ref 3.5–5.1)
Sodium: 134 mmol/L — ABNORMAL LOW (ref 135–145)

## 2022-11-06 LAB — CBC
HCT: 26.4 % — ABNORMAL LOW (ref 39.0–52.0)
Hemoglobin: 8.4 g/dL — ABNORMAL LOW (ref 13.0–17.0)
MCH: 30.3 pg (ref 26.0–34.0)
MCHC: 31.8 g/dL (ref 30.0–36.0)
MCV: 95.3 fL (ref 80.0–100.0)
Platelets: 263 10*3/uL (ref 150–400)
RBC: 2.77 MIL/uL — ABNORMAL LOW (ref 4.22–5.81)
RDW: 14.6 % (ref 11.5–15.5)
WBC: 10.5 10*3/uL (ref 4.0–10.5)
nRBC: 0 % (ref 0.0–0.2)

## 2022-11-06 LAB — URINALYSIS, ROUTINE W REFLEX MICROSCOPIC
Bilirubin Urine: NEGATIVE
Glucose, UA: NEGATIVE mg/dL
Ketones, ur: NEGATIVE mg/dL
Nitrite: NEGATIVE
Protein, ur: >=300 mg/dL — AB
Specific Gravity, Urine: 1.013 (ref 1.005–1.030)
WBC, UA: >50 WBC/hpf (ref 0–5)
pH: 7 (ref 5.0–8.0)

## 2022-11-06 MED ORDER — FERROUS GLUCONATE 324 (38 FE) MG PO TABS
324.0000 mg | ORAL_TABLET | Freq: Every day | ORAL | Status: DC
  Administered 2022-11-06 – 2022-11-09 (×4): 324 mg via ORAL
  Filled 2022-11-06 (×5): qty 1

## 2022-11-06 MED ORDER — CLOPIDOGREL BISULFATE 75 MG PO TABS
75.0000 mg | ORAL_TABLET | Freq: Every day | ORAL | Status: DC
  Administered 2022-11-07 – 2022-11-09 (×3): 75 mg via ORAL
  Filled 2022-11-06 (×3): qty 1

## 2022-11-06 MED ORDER — ISOSORBIDE MONONITRATE 20 MG PO TABS
10.0000 mg | ORAL_TABLET | Freq: Every day | ORAL | Status: DC
  Administered 2022-11-06 – 2022-11-08 (×3): 10 mg via ORAL
  Filled 2022-11-06 (×4): qty 1

## 2022-11-06 MED ORDER — DOCUSATE SODIUM 100 MG PO CAPS
100.0000 mg | ORAL_CAPSULE | Freq: Two times a day (BID) | ORAL | Status: DC
  Administered 2022-11-06 – 2022-11-09 (×6): 100 mg via ORAL
  Filled 2022-11-06 (×7): qty 1

## 2022-11-06 MED ORDER — ASPIRIN 81 MG PO CHEW
81.0000 mg | CHEWABLE_TABLET | Freq: Every day | ORAL | Status: DC
  Administered 2022-11-07 – 2022-11-09 (×3): 81 mg via ORAL
  Filled 2022-11-06 (×3): qty 1

## 2022-11-06 MED ORDER — SODIUM CHLORIDE 0.9 % IV SOLN
1.0000 g | Freq: Once | INTRAVENOUS | Status: AC
  Administered 2022-11-06: 1 g via INTRAVENOUS
  Filled 2022-11-06: qty 10

## 2022-11-06 MED ORDER — ACETAMINOPHEN 325 MG PO TABS
650.0000 mg | ORAL_TABLET | Freq: Every morning | ORAL | Status: DC
  Administered 2022-11-08: 650 mg via ORAL
  Filled 2022-11-06 (×5): qty 2

## 2022-11-06 MED ORDER — SODIUM CHLORIDE 0.9 % IV SOLN
1.0000 g | INTRAVENOUS | Status: DC
  Administered 2022-11-07 – 2022-11-08 (×2): 1 g via INTRAVENOUS
  Filled 2022-11-06 (×2): qty 10

## 2022-11-06 MED ORDER — DIVALPROEX SODIUM 125 MG PO CSDR
125.0000 mg | DELAYED_RELEASE_CAPSULE | Freq: Two times a day (BID) | ORAL | Status: DC
  Administered 2022-11-06 – 2022-11-09 (×6): 125 mg via ORAL
  Filled 2022-11-06 (×6): qty 1

## 2022-11-06 MED ORDER — INSULIN ASPART 100 UNIT/ML IJ SOLN
0.0000 [IU] | Freq: Three times a day (TID) | INTRAMUSCULAR | Status: DC
  Administered 2022-11-06 – 2022-11-07 (×2): 3 [IU] via SUBCUTANEOUS
  Administered 2022-11-07: 2 [IU] via SUBCUTANEOUS
  Administered 2022-11-08 (×2): 3 [IU] via SUBCUTANEOUS

## 2022-11-06 MED ORDER — FLUTICASONE FUROATE-VILANTEROL 100-25 MCG/ACT IN AEPB
1.0000 | INHALATION_SPRAY | Freq: Every day | RESPIRATORY_TRACT | Status: DC
  Administered 2022-11-08 – 2022-11-09 (×2): 1 via RESPIRATORY_TRACT
  Filled 2022-11-06: qty 28

## 2022-11-06 MED ORDER — LORATADINE 10 MG PO TABS
10.0000 mg | ORAL_TABLET | Freq: Every day | ORAL | Status: DC
  Administered 2022-11-06 – 2022-11-09 (×4): 10 mg via ORAL
  Filled 2022-11-06 (×4): qty 1

## 2022-11-06 MED ORDER — PANCRELIPASE (LIP-PROT-AMYL) 36000-114000 UNITS PO CPEP
36000.0000 [IU] | ORAL_CAPSULE | Freq: Three times a day (TID) | ORAL | Status: DC
  Administered 2022-11-07 – 2022-11-09 (×8): 36000 [IU] via ORAL
  Filled 2022-11-06 (×9): qty 1

## 2022-11-06 MED ORDER — LACTATED RINGERS IV BOLUS
1000.0000 mL | Freq: Once | INTRAVENOUS | Status: AC
  Administered 2022-11-06: 1000 mL via INTRAVENOUS

## 2022-11-06 MED ORDER — ATORVASTATIN CALCIUM 40 MG PO TABS
40.0000 mg | ORAL_TABLET | Freq: Every day | ORAL | Status: DC
  Administered 2022-11-06 – 2022-11-08 (×3): 40 mg via ORAL
  Filled 2022-11-06 (×3): qty 1

## 2022-11-06 MED ORDER — ENOXAPARIN SODIUM 30 MG/0.3ML IJ SOSY
30.0000 mg | PREFILLED_SYRINGE | INTRAMUSCULAR | Status: DC
  Administered 2022-11-06 – 2022-11-08 (×3): 30 mg via SUBCUTANEOUS
  Filled 2022-11-06 (×3): qty 0.3

## 2022-11-06 MED ORDER — OXYCODONE HCL 5 MG PO TABS: 5.0000 mg | ORAL_TABLET | Freq: Four times a day (QID) | ORAL | Status: DC | PRN

## 2022-11-06 MED ORDER — GABAPENTIN 100 MG PO CAPS
100.0000 mg | ORAL_CAPSULE | Freq: Two times a day (BID) | ORAL | Status: DC
  Administered 2022-11-06 – 2022-11-09 (×6): 100 mg via ORAL
  Filled 2022-11-06 (×6): qty 1

## 2022-11-06 NOTE — Assessment & Plan Note (Signed)
-  8.9 A1C almost a year ago -keep on SSI

## 2022-11-06 NOTE — ED Triage Notes (Signed)
Per [facility] pt coming from [facility]- has right sided deficit from old stroke. Pt states this morning around 9:30 am patient right sided weakness has worsened. Patient denies any complaints.

## 2022-11-06 NOTE — ED Notes (Signed)
Assumed care of patient who arrived via ems from [facility] SN[facility] for right side weakness. Pt has prior right side deficits and weakness from previous strokes but states it got worse this morning. Pt to be admitted for AKI hematuria and encephalopathy. Patient is DNR

## 2022-11-06 NOTE — Assessment & Plan Note (Signed)
-  continue Imdur.

## 2022-11-06 NOTE — Assessment & Plan Note (Signed)
-  lateral left great toe with small healing wound -presented today with off loading boots bilaterally. Hx of right TMA from PAD.

## 2022-11-06 NOTE — Assessment & Plan Note (Signed)
-  creatinine of 2.19 from 1.39 -keep on continuous IV fluid  -avoid nephrotoxic agent

## 2022-11-06 NOTE — Assessment & Plan Note (Signed)
-  s.p right TMA -continue aspirin and Plavix

## 2022-11-06 NOTE — H&P (Signed)
History and Physical    Patient: Justin [name] DOB: Jun 01, 1934 DOA: 11/06/2022 DOS: the patient was seen and examined on 11/06/2022 PCP: [facility]  Patient coming from: SNF  Chief Complaint:  Chief Complaint  Patient presents with   Weakness   HPI: Justin [name] is a 88 y.o. male with medical history significant of PAD s/p right TMA, CVA, CAD, chronic pancreatitis, Type 2 diabetes and CKD3b who presents with altered mental status and weakness.   Reportedly presented from nursing facility for increase confusion and weakness. No family at bedside. Pt unable to provide any history and is only oriented to himself. Denies any pain. Only tells me they have been working on wounds on his legs.   In the ED, he was afebrile, normotensive on room air. No leukocytosis. Stable anemia with hgb of 8.4. Has worsening creatinine of 2.19.  UA was obtained with catherization with RN noticing frank pus. Had moderate leukocytes, negative nitrate and few bacteria.   He was given IV fluids and started on IV Rocephin. Hospitalist then consulted for admission.    Review of Systems: unable to review all systems due to the inability of the patient to answer questions. Past Medical History:  Diagnosis Date   Acute metabolic encephalopathy 05/24/2021   Arthritis    Diabetes mellitus    HTN (hypertension)    Hypercholesteremia    Iron deficiency    Pancreatitis, acute 06/02/2012   Diagnosed 05/2012    Personal history of colonic adenomas 04/15/2013   04/15/2013 2 diminutive polyps     Stroke [facility])    TIA (transient ischemic attack)    Past Surgical History:  Procedure Laterality Date   ABDOMINAL AORTOGRAM W/LOWER EXTREMITY Right 11/24/2021   Procedure: ABDOMINAL AORTOGRAM W/LOWER EXTREMITY;  Surgeon: Leonie Douglas, MD;  [facility];  Service: Cardiovascular;  Laterality: Right;   AMPUTATION TOE Right 11/26/2021   Procedure: AMPUTATION TOE;  Surgeon: Vivi Barrack, DPM;  Location: [facility] OR;  Service: Podiatry;  Laterality: Right;   ARTERY BIOPSY  02/26/2011   Procedure: BIOPSY TEMPORAL ARTERY;  Surgeon: Iona Coach, MD;  Location: [facility];  Service: General;  Laterality: Left;  Left temporal artery biospy   BACK SURGERY  1985   CHOLECYSTECTOMY     COLONOSCOPY     EUS     NECK SURGERY  1985   PERIPHERAL VASCULAR BALLOON ANGIOPLASTY Right 11/24/2021   Procedure: PERIPHERAL VASCULAR BALLOON ANGIOPLASTY;  Surgeon: Leonie Douglas, MD;  [facility];  Service: Cardiovascular;  Laterality: Right;  POPLITEAL AND PERONEAL   TEMPORAL ARTERY BIOPSY / LIGATION  02/26/2011   left side   TRANSMETATARSAL AMPUTATION Right 01/28/2022   Procedure: TRANSMETATARSAL AMPUTATION; ACHILLES TENDON LENGTHENING;  Surgeon: Candelaria Stagers, DPM;  Location: [facility] OR[facility];  Service: Podiatry;  Laterality: Right;  R foot Transmetatarsal amputation, possible tendo achilles lengthening   UPPER GASTROINTESTINAL ENDOSCOPY     Social History:  reports that he quit smoking about 32 years ago. His smoking use included cigarettes. He started smoking about 67 years ago. He has never used smokeless tobacco. He reports that he does not drink alcohol and does not use drugs.  No Known Allergies  Family History  Problem Relation Age of Onset   Heart disease Mother    Amblyopia Father    Diabetes Sister        x 2   Heart attack Sister  Prior to Admission medications   Medication Sig Start Date End Date Taking? Authorizing Provider  acetaminophen (TYLENOL) 325 MG tablet Take 650 mg by mouth in the morning.   Yes [provider]  aspirin 81 MG chewable tablet Chew 81 mg by mouth daily.   Yes [provider]  atorvastatin (LIPITOR) 40 MG tablet Take 40 mg by mouth at bedtime. 11/03/21  Yes [provider]  BREO ELLIPTA 100-25 MCG/ACT AEPB Inhale 1 puff into the lungs daily. 09/14/22  Yes [provider]   clopidogrel (PLAVIX) 75 MG tablet Take 1 tablet (75 mg total) by mouth daily with breakfast. 11/28/21  Yes Rhetta Mura, MD  [employer] SALINE NOSE SPRAY NA Place 1 spray into the nose 2 (two) times daily.   Yes [provider]  divalproex (DEPAKOTE SPRINKLE) 125 MG capsule Take 125 mg by mouth 2 (two) times daily. 10/27/22  Yes [provider]  docusate sodium (COLACE) 100 MG capsule Take 100 mg by mouth 2 (two) times daily.   Yes [provider]  Dulaglutide 3 MG/0.5ML SOPN Inject 3 mg into the skin once a week. Once a week on Tuesday. 05/19/21  Yes [provider]  ferrous gluconate (FERGON) 324 MG tablet Take 324 mg by mouth daily.   Yes [provider]  gabapentin (NEURONTIN) 100 MG capsule Take 100 mg by mouth 2 (two) times daily. 10/29/22  Yes [provider]  glucagon (GLUCAGON EMERGENCY) 1 MG injection Inject 1 mg into the muscle once as needed. Patient taking differently: Inject 1 mg into the muscle once as needed (for low blood sugar). 09/29/15  Yes Carlus Pavlov, MD  glucose blood (ACCU-CHEK AVIVA PLUS) test strip Use to check blood sugar 3 times daily. Dx: E10.9 11/17/15  Yes Carlus Pavlov, MD  hydrOXYzine (ATARAX) 10 MG tablet Take 10 mg by mouth 2 (two) times daily. hold for sedation   Yes [provider]  insulin glargine, 2 Unit Dial, (TOUJEO MAX SOLOSTAR) 300 UNIT/ML Solostar Pen Inject 19 Units into the skin daily.   Yes [provider]  Ipratropium-Albuterol (COMBIVENT) 20-100 MCG/ACT AERS respimat Inhale 1 puff into the lungs every 6 (six) hours as needed for wheezing. Patient taking differently: Inhale 1 puff into the lungs every 6 (six) hours as needed (for COPD). 05/25/21  Yes Tyrone Nine, MD  isosorbide mononitrate (ISMO) 10 MG tablet Take 10 mg by mouth at bedtime.   Yes [provider]  loratadine (CLARITIN) 10 MG tablet Take 10 mg by mouth daily.   Yes [provider]  NOVOLOG  FLEXPEN 100 UNIT/ML FlexPen Inject 0-8 Units into the skin See admin instructions. Inject 0-8 units into the skin before lunch and supper, per sliding scale: if is BS 70-200 give 0 units, if BS is 201-250 give 2 units, if BS is 251-300 give 4 units, if BS is 301-350 give 6 units, if BS is 351-400 give 8 units. If blood sugar is greater than 400, call MD. 10/18/21  Yes [provider]  oxyCODONE (OXY IR/ROXICODONE) 5 MG immediate release tablet Take 1 tablet (5 mg total) by mouth every 6 (six) hours as needed for severe pain or breakthrough pain. Patient taking differently: Take 2.5 mg by mouth every 8 (eight) hours as needed for severe pain or breakthrough pain. 12/12/21  Yes Amin, Loura Halt, MD  oxymetazoline (AFRIN) 0.05 % nasal spray Place 2 sprays into both nostrils 2 (two) times daily as needed for congestion.   Yes  [provider]  Pancrelipase, Lip-Prot-Amyl, (CREON) 24000-76000 units CPEP Take 1 capsule by mouth 3 (three) times daily. With 4-6 ounces of water   Yes [provider]  Pollen Extracts (PROSTAT PO) Take 30 mLs by mouth 3 (three) times daily.   Yes [provider]  repaglinide (PRANDIN) 0.5 MG tablet Take 0.5 mg by mouth 2 (two) times daily before a meal. 10/15/22  Yes [provider]  Skin Protectants, Misc. (MINERIN CREME EX) Apply 1 Application topically daily. Both legs and feet.   Yes [provider]  Alcohol Swabs PADS Use to check blood sugar 3 times daily. Dx: E10.9 07/21/15   Trena Platt D, PA  Blood Glucose Calibration (ACCU-CHEK AVIVA) SOLN Use to check blood sugar 3 times daily. Dx: E10.9 07/21/15   Trena Platt D, PA  Blood Glucose Monitoring Suppl (ACCU-CHEK AVIVA PLUS) w/Device KIT Use to check blood sugar 3 times daily. Dx: E10.9 09/27/15   Ethelda Chick, MD  Insulin Pen Needle 32G X 4 MM MISC Use as directed to inject insulin tidwc 12/14/12   Sherren Mocha, MD  Lancets (ACCU-CHEK SOFT [facility]) lancets Use to  check blood sugar 3 times daily. Dx: E10.9 07/21/15   Trena Platt D, PA  omeprazole (PRILOSEC) 20 MG capsule Take 20 mg by mouth every other day. Patient not taking: Reported on 11/06/2022    [provider]  Spacer/Aero-Holding [facility] Use as directed with combivent. Ok to dispense any spacer that works with the inhaler being rxed to pt. 09/20/15   Sherren Mocha, MD    Physical Exam: Vitals:   11/06/22 1148 11/06/22 1150 11/06/22 1738 11/06/22 2120  BP:  (!) 109/53 111/75   Pulse:  81 78   Resp:  18 18   Temp:  98 F (36.7 C) 98.1 F (36.7 C) 98 F (36.7 C)  TempSrc:  Oral Oral Oral  SpO2:  100% 100%   Weight: 70.3 kg     Height: 6\' 2"  (1.88 m)      Constitutional: NAD, calm, comfortable, well appearing elderly male laying in bed Eyes: lids and conjunctivae normal ENMT: Mucous membranes are moist.  Neck: normal, supple Respiratory: clear to auscultation bilaterally, no wheezing, no crackles. Normal respiratory effort. No accessory muscle use.  Cardiovascular: Regular rate and rhythm, no murmurs / rubs / gallops. No extremity edema.  Abdomen: no tenderness,  Bowel sounds positive.  Musculoskeletal: no clubbing / cyanosis. Right TMA Skin: Small pinpoint healing wound on left lateral great toe Neurologic: CN 2-12 grossly intact. Alert and oriented only to self.  Psychiatric: Normal mood.   Data Reviewed:  See HPI  Assessment and Plan: * Acute metabolic encephalopathy -unsure of baseline but currently alert and oriented only to self -secondary to UTI. -continue IV Rocephin following urine culture  Type 2 diabetes mellitus with stage 3b chronic kidney disease, with long-term current use of insulin ([name]) -8.9 A1C almost a year ago -keep on SSI  PAD (peripheral artery disease) ([name]) -s.p right TMA -continue aspirin and Plavix   Essential hypertension -continue Imdur  Wound of lower extremity -lateral left great toe with small healing wound -presented  today with off loading boots bilaterally. Hx of right TMA from PAD.   Acute-on-chronic kidney injury ([name]) -creatinine of 2.19 from 1.39 -keep on continuous IV fluid  -avoid nephrotoxic agent       Advance Care Planning:   Code Status: DNR   Consults: none  Family Communication: none at bedside  Severity of Illness: The appropriate patient status for this patient is INPATIENT. Inpatient status is judged to be reasonable and necessary in order to provide the required intensity of service to ensure the patient's safety. The patient's presenting symptoms, physical exam findings, and initial radiographic and laboratory data in the context of their chronic comorbidities is felt to place them at high risk for further clinical deterioration. Furthermore, it is not anticipated that the patient will be medically stable for discharge from the hospital within 2 midnights of admission.   * I certify that at the point of admission it is my clinical judgment that the patient will require inpatient hospital care spanning beyond 2 midnights from the point of admission due to high intensity of service, high risk for further deterioration and high frequency of surveillance required.*  Author: Anselm Jungling, DO 11/06/2022 10:01 PM  For on call review [email or web address].

## 2022-11-06 NOTE — ED Provider Notes (Signed)
Pt signed out by Dr. Posey Rea pending UA.   Urine was finally obtained via cath and it did show a UTI.  Nursing reports frank pus expressed from urethra.  Pt does have AKI and is given IVFs.  Rocephin ordered.  Pt d/w Dr. Cyndia Bent (triad) for admission.   Jacalyn Lefevre, MD 11/06/22 [phone]

## 2022-11-06 NOTE — Assessment & Plan Note (Signed)
-  unsure of baseline but currently alert and oriented only to self -secondary to UTI. -continue IV Rocephin following urine culture

## 2022-11-07 DIAGNOSIS — G9341 Metabolic encephalopathy: Secondary | ICD-10-CM | POA: Diagnosis not present

## 2022-11-07 DIAGNOSIS — G928 Other toxic encephalopathy: Secondary | ICD-10-CM | POA: Diagnosis present

## 2022-11-07 LAB — CBG MONITORING, ED: Glucose-Capillary: 111 mg/dL — ABNORMAL HIGH (ref 70–99)

## 2022-11-07 LAB — GLUCOSE, CAPILLARY
Glucose-Capillary: 130 mg/dL — ABNORMAL HIGH (ref 70–99)
Glucose-Capillary: 107 mg/dL — ABNORMAL HIGH (ref 70–99)
Glucose-Capillary: 158 mg/dL — ABNORMAL HIGH (ref 70–99)

## 2022-11-07 NOTE — ED Notes (Signed)
Patient at bottom of bed removed both protective boots upset he soiled his diaper. Pt with large bowel movement was cleaned and new diaper applied. Both boots put back on feet and patient readjusted in bed for comfort. Iv covered with ace bandage to prevent being dislodged. Patient given call light with instructions to push red button for help. Patient acknowledges understanding

## 2022-11-07 NOTE — ED Notes (Signed)
ED TO INPATIENT HANDOFF REPORT  ED Nurse Name and Phone #: Beatris Ship RN [phone]  S Name/Age/Gender Justin [name] 88 y.o. male Room/Bed: 042C/042C  Code Status   Code Status: DNR  Home/SNF/Other Home Patient oriented to: self, place, time, and situation Is this baseline? Yes   Triage Complete: Triage complete  Chief Complaint Acute metabolic encephalopathy [G93.41] Toxic metabolic encephalopathy [G92.8]  Triage Note Per [facility] pt coming from [facility]- has right sided deficit from old stroke. Pt states this morning around 9:30 am patient right sided weakness has worsened. Patient denies any complaints.    Allergies No Known Allergies  Level of Care/Admitting Diagnosis ED Disposition     ED Disposition  Admit   Condition  --   Comment  Hospital Area: [facility] [[id number]]  Level of Care: Med-Surg [16]  May admit patient to [facility] or [facility] if equivalent level of care is available:: Yes  Covid Evaluation: Asymptomatic - no recent exposure (last 10 days) testing not required  Diagnosis: Toxic metabolic encephalopathy [740500]  Admitting Physician: Lonia Blood [[id number]]  Attending Physician: Sharon Seller, JEFFREY T [[id number]]  Certification:: I certify this patient will need inpatient services for at least 2 midnights          B Medical/Surgery History Past Medical History:  Diagnosis Date   Acute metabolic encephalopathy 05/24/2021   Arthritis    Diabetes mellitus    HTN (hypertension)    Hypercholesteremia    Iron deficiency    Pancreatitis, acute 06/02/2012   Diagnosed 05/2012    Personal history of colonic adenomas 04/15/2013   04/15/2013 2 diminutive polyps     Stroke [facility])    TIA (transient ischemic attack)    Past Surgical History:  Procedure Laterality Date   ABDOMINAL AORTOGRAM W/LOWER EXTREMITY Right 11/24/2021   Procedure: ABDOMINAL AORTOGRAM W/LOWER EXTREMITY;  Surgeon: Leonie Douglas, MD;  [facility];  Service: Cardiovascular;  Laterality: Right;   AMPUTATION TOE Right 11/26/2021   Procedure: AMPUTATION TOE;  Surgeon: Vivi Barrack, DPM;  Location: [facility] OR;  Service: Podiatry;  Laterality: Right;   ARTERY BIOPSY  02/26/2011   Procedure: BIOPSY TEMPORAL ARTERY;  Surgeon: Iona Coach, MD;  Location: [facility];  Service: General;  Laterality: Left;  Left temporal artery biospy   BACK SURGERY  1985   CHOLECYSTECTOMY     COLONOSCOPY     EUS     NECK SURGERY  1985   PERIPHERAL VASCULAR BALLOON ANGIOPLASTY Right 11/24/2021   Procedure: PERIPHERAL VASCULAR BALLOON ANGIOPLASTY;  Surgeon: Leonie Douglas, MD;  [facility];  Service: Cardiovascular;  Laterality: Right;  POPLITEAL AND PERONEAL   TEMPORAL ARTERY BIOPSY / LIGATION  02/26/2011   left side   TRANSMETATARSAL AMPUTATION Right 01/28/2022   Procedure: TRANSMETATARSAL AMPUTATION; ACHILLES TENDON LENGTHENING;  Surgeon: Candelaria Stagers, DPM;  [facility];  Service: Podiatry;  Laterality: Right;  R foot Transmetatarsal amputation, possible tendo achilles lengthening   UPPER GASTROINTESTINAL ENDOSCOPY       A IV Location/Drains/Wounds Patient Lines/Drains/Airways Status     Active Line/Drains/Airways     Name Placement date Placement time Site Days   Peripheral IV 11/06/22 20 G 1.88" Right Forearm 11/06/22  2208  Forearm  1   Pressure Injury 11/24/21 Heel Right Stage 2 -  Partial thickness loss of dermis presenting as a shallow open injury with a red, pink wound bed without slough.  11/24/21  2200  -- 348   Wound / Incision (Open or Dehisced) 11/21/21 Other (Comment) Toe (Comment  which one) Anterior;Right right 2nd toe 11/21/21  0645  Toe (Comment  which one)  351   Wound / Incision (Open or Dehisced) 11/21/21 Other (Comment) Heel Left;Posterior 11/21/21  0645  Heel  351            Intake/Output Last 24 hours  Intake/Output Summary (Last 24 hours) at 11/07/2022 0950 Last data  filed at 11/06/2022 2315 Gross per 24 hour  Intake 1106.02 ml  Output --  Net 1106.02 ml    Labs/Imaging Results for orders placed or performed during the hospital encounter of 11/06/22 (from the past 48 hour(s))  Basic metabolic panel     Status: Abnormal   Collection Time: 11/06/22 12:02 PM  Result Value Ref Range   Sodium 134 (L) 135 - 145 mmol/L   Potassium 3.9 3.5 - 5.1 mmol/L   Chloride 102 98 - 111 mmol/L   CO2 23 22 - 32 mmol/L   Glucose, Bld 221 (H) 70 - 99 mg/dL    Comment: Glucose reference range applies only to samples taken after fasting for at least 8 hours.   BUN 37 (H) 8 - 23 mg/dL   Creatinine, Ser 8.75 (H) 0.61 - 1.24 mg/dL   Calcium 8.5 (L) 8.9 - 10.3 mg/dL   GFR, Estimated 29 (L) >60 mL/min    Comment: (NOTE) Calculated using the CKD-EPI Creatinine Equation (2021)    Anion gap 9 5 - 15    Comment: Performed at [facility], [address], [place], [place] [address]  CBC     Status: Abnormal   Collection Time: 11/06/22 12:02 PM  Result Value Ref Range   WBC 10.5 4.0 - 10.5 K/uL   RBC 2.77 (L) 4.22 - 5.81 MIL/uL   Hemoglobin 8.4 (L) 13.0 - 17.0 g/dL   HCT 95.1 (L) 88.4 - 16.6 %   MCV 95.3 80.0 - 100.0 fL   MCH 30.3 26.0 - 34.0 pg   MCHC 31.8 30.0 - 36.0 g/dL   RDW 06.3 01.6 - 01.0 %   Platelets 263 150 - 400 K/uL   nRBC 0.0 0.0 - 0.2 %    Comment: Performed at [facility], [address], [place], [place] [address]  Urinalysis, Routine w reflex microscopic -Urine, Clean Catch     Status: Abnormal   Collection Time: 11/06/22  6:33 PM  Result Value Ref Range   Color, Urine YELLOW YELLOW   APPearance TURBID (A) CLEAR   Specific Gravity, Urine 1.013 1.005 - 1.030   pH 7.0 5.0 - 8.0   Glucose, UA NEGATIVE NEGATIVE mg/dL   Hgb urine dipstick SMALL (A) NEGATIVE   Bilirubin Urine NEGATIVE NEGATIVE   Ketones, ur NEGATIVE NEGATIVE mg/dL   Protein, ur >=573 (A) NEGATIVE mg/dL   Nitrite NEGATIVE NEGATIVE   Leukocytes,Ua MODERATE (A) NEGATIVE    RBC / HPF 11-20 0 - 5 RBC/hpf   WBC, UA >50 0 - 5 WBC/hpf   Bacteria, UA FEW (A) NONE SEEN   Squamous Epithelial / HPF 0-5 0 - 5 /HPF   WBC Clumps PRESENT    Non Squamous Epithelial 0-5 (A) NONE SEEN    Comment: Performed at [facility], [address], [place], [place] [address]  CBG monitoring, ED     Status: Abnormal   Collection Time: 11/06/22 11:08 PM  Result Value Ref Range  Glucose-Capillary 169 (H) 70 - 99 mg/dL    Comment: Glucose reference range applies only to samples taken after fasting for at least 8 hours.  Basic metabolic panel     Status: Abnormal   Collection Time: 11/07/22 12:47 AM  Result Value Ref Range   Sodium 136 135 - 145 mmol/L   Potassium 3.6 3.5 - 5.1 mmol/L   Chloride 105 98 - 111 mmol/L   CO2 23 22 - 32 mmol/L   Glucose, Bld 125 (H) 70 - 99 mg/dL    Comment: Glucose reference range applies only to samples taken after fasting for at least 8 hours.   BUN 33 (H) 8 - 23 mg/dL   Creatinine, Ser 1.61 (H) 0.61 - 1.24 mg/dL   Calcium 8.4 (L) 8.9 - 10.3 mg/dL   GFR, Estimated 35 (L) >60 mL/min    Comment: (NOTE) Calculated using the CKD-EPI Creatinine Equation (2021)    Anion gap 8 5 - 15    Comment: Performed at [facility], [address], [place], [place] [address]  Hemoglobin A1c     Status: Abnormal   Collection Time: 11/07/22 12:47 AM  Result Value Ref Range   Hgb A1c MFr Bld 6.2 (H) 4.8 - 5.6 %    Comment: (NOTE) Pre diabetes:          5.7%-6.4%  Diabetes:              >6.4%  Glycemic control for   <7.0% adults with diabetes    Mean Plasma Glucose 131.24 mg/dL    Comment: Performed at [facility], [address], [place], [place] [address]  CBG monitoring, ED     Status: Abnormal   Collection Time: 11/07/22  7:47 AM  Result Value Ref Range   Glucose-Capillary 111 (H) 70 - 99 mg/dL    Comment: Glucose reference range applies only to samples taken after fasting for at least 8 hours.   Comment 1 Notify RN    Comment 2  Document in Chart    CT Head Wo Contrast  Result Date: 11/06/2022 CLINICAL DATA:  Right-sided arm weakness worsening, history of stroke EXAM: CT HEAD WITHOUT CONTRAST TECHNIQUE: Contiguous axial images were obtained from the base of the skull through the vertex without intravenous contrast. RADIATION DOSE REDUCTION: This exam was performed according to the departmental dose-optimization program which includes automated exposure control, adjustment of the mA and/or kV according to patient size and/or use of iterative reconstruction technique. COMPARISON:  12/09/2021 FINDINGS: Brain: No evidence of acute infarction, hemorrhage, hydrocephalus, extra-axial collection or mass lesion/mass effect. Extensive periventricular and deep white matter hypodensity, not significantly changed compared to prior examination. Vascular: No hyperdense vessel or unexpected calcification. Skull: Normal. Negative for fracture or focal lesion. Sinuses/Orbits: No acute finding. Other: None. IMPRESSION: No acute intracranial pathology. Advanced small-vessel white matter disease, not significantly changed compared to prior examination. MRI may be used to more sensitively evaluate for acutely superimposed infarction if clinically suspected. Electronically Signed   By: Jearld Lesch M.D.   On: 11/06/2022 13:59    Pending Labs Unresulted Labs (From admission, onward)     Start     Ordered   11/06/22 1925  Urine Culture  Once,   URGENT       Question:  Indication  Answer:  Altered mental status (if no other cause identified)   11/06/22 1925            Vitals/Pain Today's Vitals   11/07/22  0800 11/07/22 0830 11/07/22 0900 11/07/22 0930  BP: 119/73 125/72 (!) 142/70 (!) 140/68  Pulse: 85 86 91 84  Resp:   17 18  Temp:      TempSrc:      SpO2: 98% 96% 99% 96%  Weight:      Height:      PainSc:        Isolation Precautions No active isolations  Medications Medications  enoxaparin (LOVENOX) injection 30 mg (30 mg  Subcutaneous Given 11/06/22 2304)  insulin aspart (novoLOG) injection 0-15 Units ( Subcutaneous Not Given 11/07/22 0845)  cefTRIAXone (ROCEPHIN) 1 g in sodium chloride 0.9 % 100 mL IVPB (has no administration in time range)  acetaminophen (TYLENOL) tablet 650 mg (650 mg Oral Not Given 11/07/22 0640)  oxyCODONE (Oxy IR/ROXICODONE) immediate release tablet 5 mg (has no administration in time range)  aspirin chewable tablet 81 mg (81 mg Oral Given 11/07/22 0902)  atorvastatin (LIPITOR) tablet 40 mg (40 mg Oral Given 11/06/22 2304)  isosorbide mononitrate (ISMO) tablet 10 mg (10 mg Oral Given 11/06/22 2305)  docusate sodium (COLACE) capsule 100 mg (100 mg Oral Given 11/07/22 0902)  lipase/protease/amylase (CREON) capsule 36,000 Units (36,000 Units Oral Given 11/07/22 0901)  clopidogrel (PLAVIX) tablet 75 mg (75 mg Oral Given 11/07/22 0902)  ferrous gluconate (FERGON) tablet 324 mg (324 mg Oral Given 11/07/22 0900)  divalproex (DEPAKOTE SPRINKLE) capsule 125 mg (125 mg Oral Given 11/07/22 0901)  gabapentin (NEURONTIN) capsule 100 mg (100 mg Oral Given 11/07/22 0901)  fluticasone furoate-vilanterol (BREO ELLIPTA) 100-25 MCG/ACT 1 puff (1 puff Inhalation Not Given 11/07/22 0804)  loratadine (CLARITIN) tablet 10 mg (10 mg Oral Given 11/07/22 0901)  lactated ringers bolus 1,000 mL (0 mLs Intravenous Stopped 11/06/22 1945)  cefTRIAXone (ROCEPHIN) 1 g in sodium chloride 0.9 % 100 mL IVPB (0 g Intravenous Stopped 11/06/22 2250)    Mobility walks      R Recommendations: See Admitting Provider Note  Report given to: 1O10

## 2022-11-07 NOTE — Care Management Important Message (Deleted)
Important Message  Patient Details  Name: Justin [name] MRN: [id number] Date of Birth: 1934-12-25   Medicare Important Message Given:  No     Sherilyn Banker 11/07/2022, 11:15 AM

## 2022-11-07 NOTE — Progress Notes (Deleted)
7/30 Patient out of room in procedure, will be housed in another unit after procedure. I will mail IMM Letter to address on file.

## 2022-11-07 NOTE — Progress Notes (Signed)
Justin [name]  QMV:[id number] DOB: 02/14/1935 DOA: 11/06/2022 PCP: [facility]    Brief Narrative:  88 year old SNF resident with a history of PAD status post right TMA, CVA with chronic right-sided weakness, CAD, chronic pancreatitis, DM2, and CKD stage IIIb who was transported to the ER with altered mental status and severe generalized weakness.  In the ER he was afebrile with no leukocytosis.  He had a hemoglobin of 8.4 which appears to be consistent with his baseline.  Creatinine was worsening from his baseline up to 2.19.  UA was obtained using catheterization with RN appreciating gross pus at time of urine collection and UA consistent with UTI.  CT head at presentation was unrevealing.  Goals of Care:   Code Status: DNR   DVT prophylaxis: enoxaparin (LOVENOX) injection 30 mg Start: 11/06/22 2200  Interim Hx: Afebrile since admission.  Vital signs stable.  Resting comfortably in bed at the time of visit.  Assessment & Plan:  Acute metabolic encephalopathy due to UTI Continue to treat UTI and follow mental status  UTI POA Continue empiric antibiotic and follow culture  Acute kidney injury on CKD stage IIIb Baseline creatinine approximately 1.4 -creatinine 2.2 at presentation -renal function improving with volume resuscitation  Recent Labs  Lab 11/06/22 1202 11/07/22 0047  CREATININE 2.19* 1.86*    DM2 CBG presently well-controlled -A1c 6.2  PAD status post R TMA Continue aspirin and Plavix  HTN Blood pressure well-controlled at this time  Chronic wound of left lateral great toe  Chronic normocytic anemia Likely a combination of advanced age, poor nutritional state, and CKD  Family Communication: No family present at time of exam Disposition: Anticipate return to SNF   Objective: Blood pressure 119/66, pulse 80, temperature 98 F (36.7 C), temperature source Oral, resp. rate 18, height 6\' 2"  (1.88 m), weight 70.3 kg, SpO2 99%.  Intake/Output  Summary (Last 24 hours) at 11/07/2022 0746 Last data filed at 11/06/2022 2315 Gross per 24 hour  Intake 1106.02 ml  Output --  Net 1106.02 ml   Filed Weights   11/06/22 1148  Weight: 70.3 kg    Examination: General: No acute respiratory distress Lungs: Clear to auscultation bilaterally without wheezes or crackles Cardiovascular: Regular rate and rhythm without murmur gallop or rub normal S1 and S2 Abdomen: Nontender, nondistended, soft, bowel sounds positive, no rebound, no ascites, no appreciable mass Extremities: No significant cyanosis, clubbing, or edema bilateral lower extremities  CBC: Recent Labs  Lab 11/06/22 1202  WBC 10.5  HGB 8.4*  HCT 26.4*  MCV 95.3  PLT 263   Basic Metabolic Panel: Recent Labs  Lab 11/06/22 1202 11/07/22 0047  NA 134* 136  K 3.9 3.6  CL 102 105  CO2 23 23  GLUCOSE 221* 125*  BUN 37* 33*  CREATININE 2.19* 1.86*  CALCIUM 8.5* 8.4*   GFR: Estimated Creatinine Clearance: 28.3 mL/min (A) (by C-G formula based on SCr of 1.86 mg/dL (H)).   Scheduled Meds:  acetaminophen  650 mg Oral q AM   aspirin  81 mg Oral Daily   atorvastatin  40 mg Oral QHS   clopidogrel  75 mg Oral Q breakfast   divalproex  125 mg Oral BID   docusate sodium  100 mg Oral BID   enoxaparin (LOVENOX) injection  30 mg Subcutaneous Q24H   ferrous gluconate  324 mg Oral Daily   fluticasone furoate-vilanterol  1 puff Inhalation Daily   gabapentin  100 mg Oral BID   insulin aspart  0-15 Units Subcutaneous TID PC & HS   isosorbide mononitrate  10 mg Oral QHS   lipase/protease/amylase  36,000 Units Oral TID WC   loratadine  10 mg Oral Daily   Continuous Infusions:  cefTRIAXone (ROCEPHIN)  IV       LOS: 1 day   Lonia Blood, MD [facility] Office  [phone] Pager - Text Page per Loretha Stapler  If 7PM-7AM, please contact night-coverage per Amion 11/07/2022, 7:46 AM

## 2022-11-08 DIAGNOSIS — G9341 Metabolic encephalopathy: Secondary | ICD-10-CM | POA: Diagnosis not present

## 2022-11-08 LAB — GLUCOSE, CAPILLARY
Glucose-Capillary: 103 mg/dL — ABNORMAL HIGH (ref 70–99)
Glucose-Capillary: 131 mg/dL — ABNORMAL HIGH (ref 70–99)
Glucose-Capillary: 177 mg/dL — ABNORMAL HIGH (ref 70–99)
Glucose-Capillary: 80 mg/dL (ref 70–99)
Glucose-Capillary: 83 mg/dL (ref 70–99)

## 2022-11-08 MED ORDER — INSULIN ASPART 100 UNIT/ML IJ SOLN
0.0000 [IU] | Freq: Three times a day (TID) | INTRAMUSCULAR | Status: DC
  Administered 2022-11-09 (×2): 1 [IU] via SUBCUTANEOUS

## 2022-11-08 NOTE — Inpatient Diabetes Management (Signed)
Inpatient Diabetes Program Recommendations  AACE/ADA: New Consensus Statement on Inpatient Glycemic Control (2015)  Target Ranges:  Prepandial:   less than 140 mg/dL      Peak postprandial:   less than 180 mg/dL (1-2 hours)      Critically ill patients:  140 - 180 mg/dL   Lab Results  Component Value Date   GLUCAP 177 (H) 11/08/2022   HGBA1C 6.2 (H) 11/07/2022    Latest Reference Range & Units Most Recent  Glucose 70 - 99 mg/dL 55 (L) 04/14/08 96:04  (L): Data is abnormally low  Latest Reference Range & Units 11/07/22 07:47 11/07/22 11:12 11/07/22 17:15 11/07/22 20:55 11/08/22 00:23 11/08/22 07:19 11/08/22 11:47  Glucose-Capillary 70 - 99 mg/dL 540 (H) 981 (H) 191 (H) 158 (H) 103 (H) 83 177 (H)  (H): Data is abnormally high Review of Glycemic Control  Inpatient Diabetes Program Recommendations:   Lab glucose 55 fasting this am.  Please consider: -Decrease Novolog correction to 0-9 units tid. 0-5 units hs  Thank you, Darel Hong E. Braelen Sproule, RN, MSN, CDE  Diabetes Coordinator Inpatient Glycemic Control Team Team Pager [phone] (8am-5pm) 11/08/2022 12:57 PM

## 2022-11-08 NOTE — Evaluation (Signed)
Occupational Therapy Evaluation Patient Details Name: Justin [name] MRN: [id number] DOB: 1935/03/15 Today's Date: 11/08/2022   History of Present Illness The pt is an 88 yo male presenting 7/30 with AMS and worsening of R-sided weakness. Work up revealed UTI, AKI. PMH includes: dementia, PAD s/p TMA, CVA with chronic right-sided weakness, CAD, chronic pancreatitis, DM2, and CKD stage IIIb.   Clinical Impression   Pt s/p above diagnosis. Pt from SNF, planning to return to SNF, likely at baseline function. Pt requires significant assistance for all ADLs, transfers/mobility. Pt B shoulder stiffness and overall weakness limits UB ADLs, requires significant assistance for LB ADLs and standing/mobility. Pt not oriented to time/place, may have further confusion to situation, difficult to assess. Pt would benefit from continued skilled therapy to maximize progress as able, return to SNF recommended at this time when medically stable.      Recommendations for follow up therapy are one component of a multi-disciplinary discharge planning process, led by the attending physician.  Recommendations may be updated based on patient status, additional functional criteria and insurance authorization.   Assistance Recommended at Discharge Frequent or constant Supervision/Assistance  Patient can return home with the following A lot of help with walking and/or transfers;A lot of help with bathing/dressing/bathroom;Assistance with cooking/housework;Assist for transportation    Functional Status Assessment  Patient has had a recent decline in their functional status and demonstrates the ability to make significant improvements in function in a reasonable and predictable amount of time.  Equipment Recommendations  None recommended by OT    Recommendations for Other Services       Precautions / Restrictions Precautions Precautions: Fall Precaution Comments: L TMA, R foot wounds. bilat prevalon  boots Restrictions Weight Bearing Restrictions: No      Mobility Bed Mobility Overal bed mobility: Needs Assistance Bed Mobility: Supine to Sit, Sit to Supine     Supine to sit: Mod assist, Min assist Sit to supine: Min assist   General bed mobility comments: verbal cues for positioning, able to scoot legs off EOB and sit up without physical assistance but requires min A to help scoot to EOB    Transfers Overall transfer level: Needs assistance Equipment used: Rolling walker (2 wheels) Transfers: Sit to/from Stand, Bed to chair/wheelchair/BSC Sit to Stand: Mod assist, Max assist     Step pivot transfers: Max assist, +2 physical assistance     General transfer comment: mod-max A for STS with RW for support      Balance Overall balance assessment: Needs assistance Sitting-balance support: No upper extremity supported Sitting balance-Leahy Scale: Fair Sitting balance - Comments: assistance to EOB, once sitting able to balance unsupported   Standing balance support: Bilateral upper extremity supported, During functional activity, Reliant on assistive device for balance Standing balance-Leahy Scale: Poor Standing balance comment: reliant on RW for support, max A                           ADL either performed or assessed with clinical judgement   ADL Overall ADL's : Needs assistance/impaired Eating/Feeding: Set up;Sitting   Grooming: Minimal assistance;Sitting   Upper Body Bathing: Minimal assistance;Sitting   Lower Body Bathing: Maximal assistance;Sitting/lateral leans   Upper Body Dressing : Set up;Sitting   Lower Body Dressing: Maximal assistance;Sitting/lateral leans   Toilet Transfer: Maximal assistance;Stand-pivot;BSC/3in1;Rolling walker (2 wheels)   Toileting- Clothing Manipulation and Hygiene: Moderate assistance         General ADL Comments: Pt requires  significant assistance with LB ADLs, B shoulder stiffness limits UB ADLs, likely at  baseline, from SNF     Vision         Perception     Praxis      Pertinent Vitals/Pain Pain Assessment Pain Assessment: No/denies pain     Hand Dominance Right   Extremity/Trunk Assessment Upper Extremity Assessment Upper Extremity Assessment: Generalized weakness   Lower Extremity Assessment Lower Extremity Assessment: Defer to PT evaluation RLE Deficits / Details: wounds on bilateral feet, grossly 4/5 to MMT, poor functional power RLE Sensation: WNL RLE Coordination: WNL LLE Deficits / Details: wounds on bilateral feet, grossly 4/5 to MMT, poor functional power LLE Sensation: WNL LLE Coordination: WNL   Cervical / Trunk Assessment Cervical / Trunk Assessment: Kyphotic   Communication Communication Communication: Expressive difficulties   Cognition Arousal/Alertness: Awake/alert Behavior During Therapy: WFL for tasks assessed/performed Overall Cognitive Status: Impaired/Different from baseline Area of Impairment: Orientation, Memory, Safety/judgement, Awareness                 Orientation Level: Disoriented to, Time, Place   Memory: Decreased short-term memory   Safety/Judgement: Decreased awareness of safety, Decreased awareness of deficits Awareness: Intellectual   General Comments: Pt not oriented to time/place, had some difficulty with recalling situation or prior living arragements, was able to remember eventually. Pt able to follow commands as needed.     General Comments  VSS on RA, soiled in bed with condom cath off upon arrival    Exercises     Shoulder Instructions      Home Living Family/patient expects to be discharged to:: Skilled nursing facility                                 Additional Comments: pt from [facility] nursing facility      Prior Functioning/Environment Prior Level of Function : Needs assist;Patient poor historian/Family not available             Mobility Comments: pt states use of WC or RW,  suspect staff assists ADLs Comments: pt poor historian, suspect relies on staff        OT Problem List: Decreased strength;Decreased range of motion;Decreased activity tolerance;Impaired balance (sitting and/or standing);Decreased cognition      OT Treatment/Interventions: Self-care/ADL training;Therapeutic exercise;Energy conservation;DME and/or AE instruction;Therapeutic activities    OT Goals(Current goals can be found in the care plan section) Acute Rehab OT Goals Patient Stated Goal: to improve mobility and standing tolerance OT Goal Formulation: With patient Time For Goal Achievement: 11/22/22 Potential to Achieve Goals: Fair  OT Frequency: Min 1X/week    Co-evaluation              AM-PAC OT "6 Clicks" Daily Activity     Outcome Measure Help from another person eating meals?: A Little Help from another person taking care of personal grooming?: A Little Help from another person toileting, which includes using toliet, bedpan, or urinal?: A Lot Help from another person bathing (including washing, rinsing, drying)?: A Lot Help from another person to put on and taking off regular upper body clothing?: A Little Help from another person to put on and taking off regular lower body clothing?: A Lot 6 Click Score: 15   End of Session Equipment Utilized During Treatment: Gait belt;Rolling walker (2 wheels) Nurse Communication: Mobility status  Activity Tolerance: Patient tolerated treatment well Patient left: in bed;with call bell/phone within reach;with bed  alarm set  OT Visit Diagnosis: Unsteadiness on feet (R26.81);Other abnormalities of gait and mobility (R26.89);Repeated falls (R29.6);Muscle weakness (generalized) (M62.81);Other symptoms and signs involving cognitive function                Time: 1546-1606 OT Time Calculation (min): 20 min Charges:  OT General Charges $OT Visit: 1 Visit OT Evaluation $OT Eval Moderate Complexity: 1 [address] OTR/L   Alexis Goodell 11/08/2022, 4:16 PM

## 2022-11-08 NOTE — Plan of Care (Signed)

## 2022-11-08 NOTE — Evaluation (Signed)
Physical Therapy Evaluation Patient Details Name: Justin [name] MRN: [id number] DOB: [birth year] Today's Date: 11/08/2022  History of Present Illness  The pt is an 88 yo male presenting 7/30 with AMS and worsening of R-sided weakness. Work up revealed UTI, AKI. PMH includes: dementia, PAD s/p TMA, CVA with chronic right-sided weakness, CAD, chronic pancreatitis, DM2, and CKD stage IIIb.  Clinical Impression  Pt in bed upon arrival of PT, agreeable to evaluation at this time. The pt was unable to give reliable hx regarding PLOF, but does report use of WC at baseline. He was able to follow simple commands well in the session, but only oriented to self and situation. The pt required mod-maxA to complete sit-stand transfers and small pivotal steps from bed-chair this session. He is limited by standing balance and poor functional strength and power in LE. Will continue to benefit from skilled PT acutely, but is safe to return to SNF when medically stable.      If plan is discharge home, recommend the following: A lot of help with walking and/or transfers;A lot of help with bathing/dressing/bathroom;Assistance with cooking/housework;Assist for transportation;Help with stairs or ramp for entrance   Can travel by private vehicle   No    Equipment Recommendations None recommended by PT  Recommendations for Other Services       Functional Status Assessment Patient has had a recent decline in their functional status and demonstrates the ability to make significant improvements in function in a reasonable and predictable amount of time.     Precautions / Restrictions Precautions Precautions: Fall Precaution Comments: L TMA, R foot wounds. bilat prevalon boots Restrictions Weight Bearing Restrictions: No      Mobility  Bed Mobility Overal bed mobility: Needs Assistance Bed Mobility: Supine to Sit, Sit to Supine     Supine to sit: Mod assist Sit to supine: Min assist   General bed  mobility comments: increased cues    Transfers Overall transfer level: Needs assistance Equipment used: Rolling walker (2 wheels) Transfers: Sit to/from Stand, Bed to chair/wheelchair/BSC Sit to Stand: Mod assist, Max assist   Step pivot transfers: Max assist, +2 physical assistance       General transfer comment: modA to rise and steady initially, progressed to maxA with fatigue.    Ambulation/Gait               General Gait Details: limited to pivotal steps due to fatigue     Balance Overall balance assessment: Needs assistance Sitting-balance support: No upper extremity supported Sitting balance-Leahy Scale: Fair Sitting balance - Comments: increased sway initially   Standing balance support: Bilateral upper extremity supported, During functional activity, Reliant on assistive device for balance Standing balance-Leahy Scale: Poor Standing balance comment: dependent on mod-maxA and BEU suupport                             Pertinent Vitals/Pain Pain Assessment Pain Assessment: No/denies pain    Home Living Family/patient expects to be discharged to:: Skilled nursing facility                   Additional Comments: pt from [place] nursing facility    Prior Function Prior Level of Function : Needs assist;Patient poor historian/Family not available             Mobility Comments: pt states use of WC or RW, suspect staff assists ADLs Comments: pt poor historian, suspect relies on staff  Hand Dominance   Dominant Hand: Right    Extremity/Trunk Assessment   Upper Extremity Assessment Upper Extremity Assessment: Generalized weakness    Lower Extremity Assessment Lower Extremity Assessment: Generalized weakness;RLE deficits/detail;LLE deficits/detail RLE Deficits / Details: wounds on bilateral feet, grossly 4/5 to MMT, poor functional power RLE Sensation: WNL RLE Coordination: WNL LLE Deficits / Details: wounds on bilateral feet,  grossly 4/5 to MMT, poor functional power LLE Sensation: WNL LLE Coordination: WNL    Cervical / Trunk Assessment Cervical / Trunk Assessment: Kyphotic  Communication   Communication: Expressive difficulties  Cognition Arousal/Alertness: Awake/alert Behavior During Therapy: WFL for tasks assessed/performed Overall Cognitive Status: Impaired/Different from baseline Area of Impairment: Orientation, Memory, Safety/judgement, Awareness                 Orientation Level: Disoriented to, Time   Memory: Decreased short-term memory   Safety/Judgement: Decreased awareness of safety, Decreased awareness of deficits Awareness: Intellectual   General Comments: pt unaware of bed soiled, states year as 2012, not able to name month or day even after being told in session. no family present to confrim baseline. following commands but not initiating without cues from PT        General Comments General comments (skin integrity, edema, etc.): VSS on RA, soiled in bed with condom cath off upon arrival    Exercises     Assessment/Plan    PT Assessment Patient needs continued PT services  PT Problem List Decreased strength;Decreased range of motion;Decreased activity tolerance;Decreased balance;Decreased mobility       PT Treatment Interventions Gait training;Stair training;Functional mobility training;Therapeutic activities;Therapeutic exercise;Balance training    PT Goals (Current goals can be found in the Care Plan section)  Acute Rehab PT Goals Patient Stated Goal: return home PT Goal Formulation: With patient Time For Goal Achievement: 11/22/22 Potential to Achieve Goals: Good    Frequency Min 1X/week        AM-PAC PT "6 Clicks" Mobility  Outcome Measure Help needed turning from your back to your side while in a flat bed without using bedrails?: A Lot Help needed moving from lying on your back to sitting on the side of a flat bed without using bedrails?: A Lot Help  needed moving to and from a bed to a chair (including a wheelchair)?: Total Help needed standing up from a chair using your arms (e.g., wheelchair or bedside chair)?: A Lot Help needed to walk in hospital room?: Total Help needed climbing 3-5 steps with a railing? : Total 6 Click Score: 9    End of Session Equipment Utilized During Treatment: Gait belt Activity Tolerance: Patient tolerated treatment well;Patient limited by fatigue Patient left: in bed;with call bell/phone within reach;with chair alarm set Nurse Communication: Mobility status PT Visit Diagnosis: Other abnormalities of gait and mobility (R26.89);Muscle weakness (generalized) (M62.81)    Time: 5188-4166 PT Time Calculation (min) (ACUTE ONLY): 23 min   Charges:   PT Evaluation $PT Eval Low Complexity: 1 Low PT Treatments $Therapeutic Activity: 8-22 mins PT General Charges $$ ACUTE PT VISIT: 1 Visit         Vickki Muff, PT, DPT   Acute Rehabilitation Department Office [phone] Secure Chat Communication Preferred  Ronnie Derby 11/08/2022, 2:39 PM

## 2022-11-08 NOTE — Progress Notes (Signed)
Justin [name]  QVZ:[id number] DOB: [birth year] DOA: 11/06/2022 PCP: [facility]    Brief Narrative:  88 year old SNF resident with a history of PAD status post right TMA, CVA with chronic right-sided weakness, CAD, chronic pancreatitis, DM2, and CKD stage IIIb who was transported to the ER with altered mental status and severe generalized weakness.  In the ER he was afebrile with no leukocytosis.  He had a hemoglobin of 8.4 which appears to be consistent with his baseline.  Creatinine was worsening from his baseline up to 2.19.  UA was obtained using catheterization with RN appreciating gross pus at time of urine collection and UA consistent with UTI.  CT head at presentation was unrevealing.  Goals of Care:   Code Status: DNR   DVT prophylaxis: enoxaparin (LOVENOX) injection 30 mg Start: 11/06/22 2200  Interim Hx: No acute events recorded overnight.  Afebrile.  Vital signs stable.  Much more alert and conversant at the time of visit today.  He is able to tell me he is in the hospital.  Denies any complaints.  Reports a good appetite.  Assessment & Plan:  Acute metabolic encephalopathy due to UTI Continue to treat UTI and follow mental status -much improved at this time  UTI POA Continue empiric antibiotic -no results on urine culture thus far  Acute kidney injury on CKD stage IIIb Baseline creatinine approximately 1.4 -creatinine 2.2 at presentation -renal function improving with volume resuscitation  Recent Labs  Lab 11/06/22 1202 11/07/22 0047 11/08/22 0302  CREATININE 2.19* 1.86* 1.60*    DM2 CBG presently well-controlled -A1c 6.2  PAD status post R TMA Continue aspirin and Plavix  HTN Blood pressure well-controlled at this time  Chronic wound of left lateral great toe  Chronic normocytic anemia Likely a combination of advanced age, poor nutritional state, and CKD  Family Communication: No family present at time of exam Disposition: Anticipate return  to SNF, possibly as early as 8/2   Objective: Blood pressure 134/63, pulse 81, temperature 98.6 F (37 C), temperature source Oral, resp. rate 17, height 6\' 2"  (1.88 m), weight 63.6 kg, SpO2 99%.  Intake/Output Summary (Last 24 hours) at 11/08/2022 1016 Last data filed at 11/08/2022 0031 Gross per 24 hour  Intake 360 ml  Output --  Net 360 ml   Filed Weights   11/06/22 1148 11/08/22 0500  Weight: 70.3 kg 63.6 kg    Examination: General: No acute respiratory distress Lungs: Clear to auscultation bilaterally without wheezes or crackles Cardiovascular: Regular rate and rhythm without murmur gallop or rub normal S1 and S2 Abdomen: Nontender, nondistended, soft, bowel sounds positive, no rebound Extremities: No significant edema bilateral lower extremities  CBC: Recent Labs  Lab 11/06/22 1202 11/08/22 0302  WBC 10.5 8.1  HGB 8.4* 8.6*  HCT 26.4* 26.7*  MCV 95.3 94.0  PLT 263 270   Basic Metabolic Panel: Recent Labs  Lab 11/06/22 1202 11/07/22 0047 11/08/22 0302  NA 134* 136 138  K 3.9 3.6 3.6  CL 102 105 106  CO2 23 23 23   GLUCOSE 221* 125* 55*  BUN 37* 33* 25*  CREATININE 2.19* 1.86* 1.60*  CALCIUM 8.5* 8.4* 8.6*   GFR: Estimated Creatinine Clearance: 29.8 mL/min (A) (by C-G formula based on SCr of 1.6 mg/dL (H)).   Scheduled Meds:  acetaminophen  650 mg Oral q AM   aspirin  81 mg Oral Daily   atorvastatin  40 mg Oral QHS   clopidogrel  75 mg Oral Q breakfast  divalproex  125 mg Oral BID   docusate sodium  100 mg Oral BID   enoxaparin (LOVENOX) injection  30 mg Subcutaneous Q24H   ferrous gluconate  324 mg Oral Daily   fluticasone furoate-vilanterol  1 puff Inhalation Daily   gabapentin  100 mg Oral BID   insulin aspart  0-15 Units Subcutaneous TID PC & HS   isosorbide mononitrate  10 mg Oral QHS   lipase/protease/amylase  36,000 Units Oral TID WC   loratadine  10 mg Oral Daily   Continuous Infusions:  cefTRIAXone (ROCEPHIN)  IV 1 g (11/07/22 2310)      LOS: 2 days   Lonia Blood, MD [facility] Office  [phone] Pager - Text Page per Loretha Stapler  If 7PM-7AM, please contact night-coverage per Amion 11/08/2022, 10:16 AM

## 2022-11-09 ENCOUNTER — Ambulatory Visit: Payer: Medicare Other

## 2022-11-09 DIAGNOSIS — G9341 Metabolic encephalopathy: Secondary | ICD-10-CM | POA: Diagnosis not present

## 2022-11-09 LAB — GLUCOSE, CAPILLARY
Glucose-Capillary: 123 mg/dL — ABNORMAL HIGH (ref 70–99)
Glucose-Capillary: 146 mg/dL — ABNORMAL HIGH (ref 70–99)

## 2022-11-09 MED ORDER — CEFDINIR 300 MG PO CAPS: 300.0000 mg | ORAL_CAPSULE | Freq: Two times a day (BID) | ORAL | Status: DC

## 2022-11-09 MED ORDER — CEFDINIR 300 MG PO CAPS: 300.0000 mg | ORAL_CAPSULE | Freq: Every evening | ORAL | Status: DC

## 2022-11-09 MED ORDER — CEFDINIR 300 MG PO CAPS
300.0000 mg | ORAL_CAPSULE | Freq: Every evening | ORAL | Status: DC
  Filled 2022-11-09: qty 1

## 2022-11-09 MED ORDER — ONDANSETRON HCL 4 MG/2ML IJ SOLN
4.0000 mg | Freq: Four times a day (QID) | INTRAMUSCULAR | Status: DC | PRN
  Administered 2022-11-09: 4 mg via INTRAVENOUS
  Filled 2022-11-09: qty 2

## 2022-11-09 MED ORDER — ONDANSETRON HCL 4 MG PO TABS: 4.0000 mg | ORAL_TABLET | Freq: Three times a day (TID) | ORAL | Status: DC | PRN

## 2022-11-09 NOTE — [place] FL2 (Signed)
[place] MEDICAID FL2 LEVEL OF CARE FORM     IDENTIFICATION  Patient Name: Justin [name] Birthdate: [birth year] Sex: male Admission Date (Current Location): 11/06/2022  [place] and [place] Number:  Haynes Bast [id number] Facility and Address:  [facility], [address], [place], [place] [address]      Provider Number: [id number]  Attending Physician Name and Address:  Lonia Blood, MD  Relative Name and Phone Number:  Icholas, Irby Daughter [phone]    Current Level of Care: Hospital Recommended Level of Care: Skilled Nursing Facility Prior Approval Number:    Date Approved/Denied:   PASRR Number: [id number]  Discharge Plan: SNF    Current Diagnoses: Patient Active Problem List   Diagnosis Date Noted   Toxic metabolic encephalopathy 11/07/2022   Acute metabolic encephalopathy 11/06/2022   UTI (urinary tract infection) 11/06/2022   Wound of lower extremity 11/06/2022   GERD without esophagitis 01/26/2022   Osteomyelitis of second toe of right foot ([name]) 01/25/2022   Acute-on-chronic kidney injury ([name])    Altered mental status    PAD (peripheral artery disease) ([name]) 12/09/2021   Balanitis 12/09/2021   Pressure injury of skin 11/25/2021   Diabetic foot ([name]) 11/16/2021   Diabetic foot infection ([name]) 11/15/2021   Intertrigo 11/15/2021   History of CVA (cerebrovascular accident) 05/21/2021   Chronic kidney disease, stage 3b ([name]) 05/21/2021   Mixed diabetic hyperlipidemia associated with type 2 diabetes mellitus ([name]) 05/21/2021   Polyneuropathy due to type 2 diabetes mellitus ([name]) 05/21/2021   Leukocytosis 05/21/2021   Type 2 diabetes mellitus with stage 3b chronic kidney disease, with long-term current use of insulin ([name]) 09/29/2015   Arthritis 09/06/2015   Diabetic peripheral neuropathy ([name]) 08/17/2015   OA (osteoarthritis) of knee 08/17/2015   Primary osteoarthritis of both knees 08/11/2015   Primary osteoarthritis of  left hip 08/11/2015   Hammer toe, acquired 09/30/2013   Personal history of colonic adenomas 04/15/2013   Chronic pancreatitis ([name]) 02/26/2013   Anemia of chronic disease 02/26/2013   Goals of care, counseling/discussion 02/26/2013   Essential hypertension 12/14/2012   Testosterone deficiency 10/22/2011   Headache above the eye region 02/22/2011   Coronary artery disease 02/22/2011    Orientation RESPIRATION BLADDER Height & Weight     Self  Normal Incontinent Weight: 140 lb 3.4 oz (63.6 kg) Height:  6\' 2"  (188 cm)  BEHAVIORAL SYMPTOMS/MOOD NEUROLOGICAL BOWEL NUTRITION STATUS      Continent Diet  AMBULATORY STATUS COMMUNICATION OF NEEDS Skin   Total Care Verbally Other (Comment) (healing ulcer great toe)                       Personal Care Assistance Level of Assistance  Bathing, Feeding, Dressing Bathing Assistance: Maximum assistance Feeding assistance: Limited assistance Dressing Assistance: Maximum assistance     Functional Limitations Info  Sight, Hearing, Speech Sight Info: Adequate Hearing Info: Adequate Speech Info: Adequate    SPECIAL CARE FACTORS FREQUENCY  PT (By licensed PT), OT (By licensed OT)     PT Frequency: 5x week OT Frequency: 5x week            Contractures Contractures Info: Not present    Additional Factors Info  Code Status, Allergies, Insulin Sliding Scale Code Status Info: DNR Allergies Info: NKA   Insulin Sliding Scale Info: Novolog: see discharge summary       Current Medications (11/09/2022):  This is the current hospital active medication list Current Facility-Administered Medications  Medication Dose Route Frequency Provider Last Rate Last Admin   acetaminophen (TYLENOL) tablet 650 mg  650 mg Oral q AM Tu, Ching T, DO   650 mg at 11/08/22 0955   aspirin chewable tablet 81 mg  81 mg Oral Daily Tu, Ching T, DO   81 mg at 11/09/22 0830   atorvastatin (LIPITOR) tablet 40 mg  40 mg Oral QHS Tu, Ching T, DO   40 mg at  11/08/22 2219   cefdinir (OMNICEF) capsule 300 mg  300 mg Oral QPM Combs, Lynita [place], [facility]       clopidogrel (PLAVIX) tablet 75 mg  75 mg Oral Q breakfast Tu, Ching T, DO   75 mg at 11/09/22 0830   divalproex (DEPAKOTE SPRINKLE) capsule 125 mg  125 mg Oral BID Tu, Ching T, DO   125 mg at 11/09/22 0831   docusate sodium (COLACE) capsule 100 mg  100 mg Oral BID Tu, Ching T, DO   100 mg at 11/09/22 0831   enoxaparin (LOVENOX) injection 30 mg  30 mg Subcutaneous Q24H Tu, Ching T, DO   30 mg at 11/08/22 2219   ferrous gluconate (FERGON) tablet 324 mg  324 mg Oral Daily Tu, Ching T, DO   324 mg at 11/09/22 0831   fluticasone furoate-vilanterol (BREO ELLIPTA) 100-25 MCG/ACT 1 puff  1 puff Inhalation Daily Tu, Ching T, DO   1 puff at 11/09/22 0825   gabapentin (NEURONTIN) capsule 100 mg  100 mg Oral BID Tu, Ching T, DO   100 mg at 11/09/22 0831   insulin aspart (novoLOG) injection 0-9 Units  0-9 Units Subcutaneous TID WC Lonia Blood, MD   1 Units at 11/09/22 0830   isosorbide mononitrate (ISMO) tablet 10 mg  10 mg Oral QHS Tu, Ching T, DO   10 mg at 11/08/22 2220   lipase/protease/amylase (CREON) capsule 36,000 Units  36,000 Units Oral TID WC Tu, Ching T, DO   36,000 Units at 11/09/22 0831   loratadine (CLARITIN) tablet 10 mg  10 mg Oral Daily Tu, Ching T, DO   10 mg at 11/09/22 0830   ondansetron (ZOFRAN) injection 4 mg  4 mg Intravenous Q6H PRN Anthoney Harada, NP       oxyCODONE (Oxy IR/ROXICODONE) immediate release tablet 5 mg  5 mg Oral Q6H PRN Tu, Ching T, DO         Discharge Medications: Please see discharge summary for a list of discharge medications.  Relevant Imaging Results:  Relevant Lab Results:   Additional Information SS#: [id number]  Lorri Frederick, LCSW

## 2022-11-09 NOTE — Progress Notes (Signed)
Patient discharged, attempted to call report several times to facility and there is no answer when call is transferred. Patient IV removed per protocol. Family notified of patient discharge and time he will be discharging to facility. Patient awaiting PTAR to arrive to discharge home.

## 2022-11-09 NOTE — Progress Notes (Signed)
PHARMACY NOTE:  ANTIMICROBIAL RENAL DOSAGE ADJUSTMENT  Current antimicrobial regimen includes a mismatch between antimicrobial dosage and estimated renal function.  As per policy approved by the Pharmacy & Therapeutics and Medical Executive Committees, the antimicrobial dosage will be adjusted accordingly.  Current antimicrobial dosage:  cefdinir 300 mg PO q12h x 8 doses  Indication: UTI  Renal Function:  Estimated Creatinine Clearance: 28.7 mL/min (A) (by C-G formula based on SCr of 1.66 mg/dL (H)). []      On intermittent HD, scheduled: []      On CRRT    Antimicrobial dosage has been changed to:  cefdinir 300 mg PO q24h x 4 doses  Additional comments:    Thank you for involving pharmacy in this patient's care.  Loura Back, PharmD, BCPS Clinical Pharmacist Clinical phone for 11/09/2022 is [phone] 11/09/2022 11:19 AM

## 2022-11-09 NOTE — Plan of Care (Signed)

## 2022-11-09 NOTE — TOC Transition Note (Signed)
Transition of Care [facility]) - CM/SW Discharge Note   Patient Details  Name: Justin [name] MRN: [id number] Date of Birth: 1934-06-29  Transition [facility] Care [facility]) CM/SW Contact:  Lorri Frederick, LCSW Phone Number: 11/09/2022, 12:56 PM   Clinical Narrative:   Pt discharging to [facility].  RN call report to [phone].    Final next level of care: Skilled Nursing Facility Barriers to Discharge: No Barriers Identified   Patient Goals and CMS Choice   Choice offered to / list presented to : Adult Children (daughter [name])  Discharge Placement                Patient chooses bed at: [facility] Patient to be transferred to facility by: PTAR Name of family member notified: daughter [name] Patient and family notified of of transfer: 11/09/22  Discharge Plan and Services Additional resources added to the After Visit Summary for   In-house Referral: Clinical Social Work   Post Acute Care Choice: Skilled Nursing Facility                               Social Determinants of Health (SDOH) Interventions SDOH Screenings   Food Insecurity: No Food Insecurity (01/25/2022)  Housing: Low Risk  (01/25/2022)  Transportation Needs: No Transportation Needs (01/25/2022)  Utilities: Not At Risk (01/25/2022)  Depression (PHQ2-9): Low Risk  (02/13/2022)  Tobacco Use: Medium Risk (11/06/2022)     Readmission Risk Interventions    01/26/2022   11:24 AM  Readmission Risk Prevention Plan  Transportation Screening Complete  PCP or Specialist Appt within 3-5 Days Complete  HRI or Home Care Consult Complete  Social Work Consult for Recovery Care Planning/Counseling Complete  Palliative Care Screening Not Applicable  Medication Review Oceanographer) Complete

## 2022-11-09 NOTE — Discharge Summary (Signed)
DISCHARGE SUMMARY  Justin [name]  MR#: [id number]  DOB:1934-12-29  Date of Admission: 11/06/2022 Date of Discharge: 11/09/2022  Attending Physician: Silvestre Gunner, MD  Patient's QMV:HQIONG, Justin [name] Medical  Consults: none   Disposition: D/C to SNF   Follow-up Appts:  Follow-up Information     [facility] Follow up in 1 week(s).   Contact information: [address] [place] [address] [phone]                 Tests Needing Follow-up: -Routine re-evaluation of his renal function with BMET is recommended in 5-7 days -encourage consistent oral intake of fluids and food   Discharge Diagnoses: Acute metabolic encephalopathy due to UTI UTI POA Acute kidney injury on CKD stage IIIb DM2 PAD status post R TMA HTN Chronic wound of left lateral great toe Chronic normocytic anemia  Initial presentation: 88 year old SNF resident with a history of PAD status post right TMA, CVA with chronic right-sided weakness, CAD, chronic pancreatitis, DM2, and CKD stage IIIb who was transported to the ER with altered mental status and severe generalized weakness. In the ER he was afebrile with no leukocytosis. He had a hemoglobin of 8.4 which appears to be consistent with his baseline. Creatinine was worse than his baseline up to 2.19. UA was obtained using catheterization with RN appreciating gross pus at time of urine collection and UA consistent with UTI. CT head at presentation was unrevealing.   Hospital Course:  Acute metabolic encephalopathy due to UTI Mental status returned to baseline with treatment of UTI   Proteus UTI POA Clinically the patient has responded well to empiric antibiotic therapy -plan to complete a 7-day course of treatment   Acute kidney injury on CKD stage IIIb Baseline creatinine approximately 1.4 -creatinine 2.2 at presentation -renal function has improved with volume resuscitation and stabilized at approximately 1.6 -routine  re-evaluation of renal function is recommended in 5-7 days  DM2 CBG well-controlled - A1c 6.2   PAD status post R TMA Continue aspirin and Plavix   HTN No changes made in patient's chronic blood pressure treatment regimen   Chronic wound of left lateral great toe Continue daily monitoring and care  Chronic normocytic anemia Likely a combination of advanced age, poor nutritional state, and CKD -hemoglobin stable at approximately 8.5  Allergies as of 11/09/2022   No Known Allergies      Medication List     STOP taking these medications    omeprazole 20 MG capsule Commonly known as: PRILOSEC   oxyCODONE 5 MG immediate release tablet Commonly known as: Oxy IR/ROXICODONE       TAKE these medications    Accu-Chek Aviva Plus w/Device Kit Use to check blood sugar 3 times daily. Dx: E10.9   Accu-Chek Aviva Soln Use to check blood sugar 3 times daily. Dx: E10.9   accu-chek soft touch lancets Use to check blood sugar 3 times daily. Dx: E10.9   acetaminophen 325 MG tablet Commonly known as: TYLENOL Take 650 mg by mouth in the morning.   Alcohol Swabs Pads Use to check blood sugar 3 times daily. Dx: E10.9   aspirin 81 MG chewable tablet Chew 81 mg by mouth daily.   atorvastatin 40 MG tablet Commonly known as: LIPITOR Take 40 mg by mouth at bedtime.   Breo Ellipta 100-25 MCG/ACT Aepb Generic drug: fluticasone furoate-vilanterol Inhale 1 puff into the lungs daily.   cefdinir 300 MG capsule Commonly known as: OMNICEF Take 1 capsule (300 mg total)  by mouth every evening.   clopidogrel 75 MG tablet Commonly known as: PLAVIX Take 1 tablet (75 mg total) by mouth daily with breakfast.   Creon 24000-76000 units Cpep Generic drug: Pancrelipase (Lip-Prot-Amyl) Take 1 capsule by mouth 3 (three) times daily. With 4-6 ounces of water   [employer] SALINE NOSE SPRAY NA Place 1 spray into the nose 2 (two) times daily.   divalproex 125 MG capsule Commonly known as: DEPAKOTE  SPRINKLE Take 125 mg by mouth 2 (two) times daily.   docusate sodium 100 MG capsule Commonly known as: COLACE Take 100 mg by mouth 2 (two) times daily.   Dulaglutide 3 MG/0.5ML Sopn Inject 3 mg into the skin once a week. Once a week on Tuesday.   ferrous gluconate 324 MG tablet Commonly known as: FERGON Take 324 mg by mouth daily.   gabapentin 100 MG capsule Commonly known as: NEURONTIN Take 100 mg by mouth 2 (two) times daily.   glucagon 1 MG injection Inject 1 mg into the muscle once as needed. What changed: reasons to take this   glucose blood test strip Commonly known as: Accu-Chek Aviva Plus Use to check blood sugar 3 times daily. Dx: E10.9   hydrOXYzine 10 MG tablet Commonly known as: ATARAX Take 10 mg by mouth 2 (two) times daily. hold for sedation   Insulin Pen Needle 32G X 4 MM Misc Use as directed to inject insulin tidwc   Ipratropium-Albuterol 20-100 MCG/ACT Aers respimat Commonly known as: COMBIVENT Inhale 1 puff into the lungs every 6 (six) hours as needed for wheezing. What changed: reasons to take this   isosorbide mononitrate 10 MG tablet Commonly known as: ISMO Take 10 mg by mouth at bedtime.   loratadine 10 MG tablet Commonly known as: CLARITIN Take 10 mg by mouth daily.   MINERIN CREME EX Apply 1 Application topically daily. Both legs and feet.   NovoLOG FlexPen 100 UNIT/ML FlexPen Generic drug: insulin aspart Inject 0-8 Units into the skin See admin instructions. Inject 0-8 units into the skin before lunch and supper, per sliding scale: if is BS 70-200 give 0 units, if BS is 201-250 give 2 units, if BS is 251-300 give 4 units, if BS is 301-350 give 6 units, if BS is 351-400 give 8 units. If blood sugar is greater than 400, call MD.   ondansetron 4 MG tablet Commonly known as: Zofran Take 1 tablet (4 mg total) by mouth every 8 (eight) hours as needed for nausea or vomiting.   oxymetazoline 0.05 % nasal spray Commonly known as: AFRIN Place  2 sprays into both nostrils 2 (two) times daily as needed for congestion.   PROSTAT PO Take 30 mLs by mouth 3 (three) times daily.   repaglinide 0.5 MG tablet Commonly known as: PRANDIN Take 0.5 mg by mouth 2 (two) times daily before a meal.   Spacer/Aero-Holding [employer] Use as directed with combivent. Ok to dispense any spacer that works with the inhaler being rxed to pt.   Toujeo Max SoloStar 300 UNIT/ML Solostar Pen Generic drug: insulin glargine (2 Unit Dial) Inject 19 Units into the skin daily.        Day of Discharge BP (!) 151/83 (BP Location: Left Arm)   Pulse 91   Temp 98.5 F (36.9 C) (Oral)   Resp 18   Ht 6\' 2"  (1.88 m)   Wt 63.6 kg   SpO2 99%   BMI 18.00 kg/m   Physical Exam: General: No acute respiratory distress  Lungs: Clear to auscultation bilaterally without wheezes or crackles Cardiovascular: Regular rate and rhythm without murmur gallop or rub normal S1 and S2 Abdomen: Nontender, nondistended, soft, bowel sounds positive, no rebound, no ascites, no appreciable mass Extremities: No significant cyanosis, clubbing, or edema bilateral lower extremities  Basic Metabolic Panel: Recent Labs  Lab 11/06/22 1202 11/07/22 0047 11/08/22 0302 11/09/22 0833  NA 134* 136 138 135  K 3.9 3.6 3.6 3.8  CL 102 105 106 105  CO2 23 23 23  20*  GLUCOSE 221* 125* 55* 153*  BUN 37* 33* 25* 22  CREATININE 2.19* 1.86* 1.60* 1.66*  CALCIUM 8.5* 8.4* 8.6* 8.5*  MG  --   --   --  1.8    CBC: Recent Labs  Lab 11/06/22 1202 11/08/22 0302 11/09/22 0833  WBC 10.5 8.1 9.1  HGB 8.4* 8.6* 8.5*  HCT 26.4* 26.7* 27.0*  MCV 95.3 94.0 95.4  PLT 263 270 276    Recent Results (from the past 240 hour(s))  Urine Culture     Status: Abnormal   Collection Time: 11/06/22  6:33 PM   Specimen: Urine, Catheterized  Result Value Ref Range Status   Specimen Description URINE, CATHETERIZED  Final   Special Requests   Final    NONE Performed at [facility],  [address], [place], [place] [address]    Culture >=100,000 COLONIES/mL PROTEUS MIRABILIS (A)  Final   Report Status 11/09/2022 FINAL  Final   Organism ID, Bacteria PROTEUS MIRABILIS (A)  Final      Susceptibility   Proteus mirabilis - MIC*    AMPICILLIN <=2 SENSITIVE Sensitive     CEFAZOLIN <=4 SENSITIVE Sensitive     CEFEPIME <=0.12 SENSITIVE Sensitive     CEFTRIAXONE <=0.25 SENSITIVE Sensitive     CIPROFLOXACIN >=4 RESISTANT Resistant     GENTAMICIN <=1 SENSITIVE Sensitive     IMIPENEM 2 SENSITIVE Sensitive     NITROFURANTOIN 128 RESISTANT Resistant     TRIMETH/SULFA <=20 SENSITIVE Sensitive     AMPICILLIN/SULBACTAM <=2 SENSITIVE Sensitive     PIP/TAZO <=4 SENSITIVE Sensitive     * >=100,000 COLONIES/mL PROTEUS MIRABILIS     Time spent in discharge (includes decision making & examination of pt): 35 minutes  11/09/2022, 12:09 PM   Lonia Blood, MD [facility] Office  [phone]

## 2022-11-09 NOTE — TOC Initial Note (Signed)
Transition of Care [facility]) - Initial/Assessment Note    Patient Details  Name: Justin [name] MRN: [id number] Date of Birth: [birth year]  Transition of Care [facility]) CM/SW Contact:    Lorri Frederick, LCSW Phone Number: 11/09/2022, 12:54 PM  Clinical Narrative:  Pt oriented x1, was able to participate in conversation, identified daughter Justin [name] as contact.  CSW spoke with pt daughter Justin [name], who confirmed pt LTC at [facility], she does want him to return at DC.  CSW message with [facility] who can receive pt today.  MD informed.                  Expected Discharge Plan: Skilled Nursing Facility Barriers to Discharge: No Barriers Identified   Patient Goals and CMS Choice     Choice offered to / list presented to : Adult Children (daughter Justin [name])      Expected Discharge Plan and Services In-house Referral: Clinical Social Work   Post Acute Care Choice: Skilled Nursing Facility Living arrangements for the past 2 months: Skilled Nursing Facility Expected Discharge Date: 11/09/22                                    Prior Living Arrangements/Services Living arrangements for the past 2 months: Skilled Nursing Facility Lives with:: Facility Resident Patient language and need for interpreter reviewed:: Yes        Need for Family Participation in Patient Care: Yes (Comment) Care giver support system in place?: Yes (comment) Current home services: Other (comment) (na) Criminal Activity/Legal Involvement Pertinent to Current Situation/Hospitalization: No - Comment as needed  Activities of Daily Living      Permission Sought/Granted                  Emotional Assessment Appearance:: Appears stated age Attitude/Demeanor/Rapport: Engaged Affect (typically observed): Pleasant Orientation: : Oriented to Self      Admission diagnosis:  Dehydration [E86.0] Acute cystitis without hematuria [N30.00] AKI (acute kidney injury) ([name]) [N17.9] Toxic metabolic encephalopathy  [G92.8] Acute metabolic encephalopathy [G93.41] Patient Active Problem List   Diagnosis Date Noted   Toxic metabolic encephalopathy 11/07/2022   Acute metabolic encephalopathy 11/06/2022   UTI (urinary tract infection) 11/06/2022   Wound of lower extremity 11/06/2022   GERD without esophagitis 01/26/2022   Osteomyelitis of second toe of right foot ([name]) 01/25/2022   Acute-on-chronic kidney injury ([name])    Altered mental status    PAD (peripheral artery disease) ([name]) 12/09/2021   Balanitis 12/09/2021   Pressure injury of skin 11/25/2021   Diabetic foot ([name]) 11/16/2021   Diabetic foot infection ([name]) 11/15/2021   Intertrigo 11/15/2021   History of CVA (cerebrovascular accident) 05/21/2021   Chronic kidney disease, stage 3b ([name]) 05/21/2021   Mixed diabetic hyperlipidemia associated with type 2 diabetes mellitus ([name]) 05/21/2021   Polyneuropathy due to type 2 diabetes mellitus ([name]) 05/21/2021   Leukocytosis 05/21/2021   Type 2 diabetes mellitus with stage 3b chronic kidney disease, with long-term current use of insulin ([name]) 09/29/2015   Arthritis 09/06/2015   Diabetic peripheral neuropathy ([name]) 08/17/2015   OA (osteoarthritis) of knee 08/17/2015   Primary osteoarthritis of both knees 08/11/2015   Primary osteoarthritis of left hip 08/11/2015   Hammer toe, acquired 09/30/2013   Personal history of colonic adenomas 04/15/2013   Chronic pancreatitis ([name]) 02/26/2013   Anemia of chronic disease 02/26/2013   Goals of care, counseling/discussion 02/26/2013   Essential hypertension  12/14/2012   Testosterone deficiency 10/22/2011   Headache above the eye region 02/22/2011   Coronary artery disease 02/22/2011   PCP:  [facility]:   [facility] - [place], [place] - [address] [place] [address] [place] [address] [place] [address] Phone: [phone] Fax: [phone]     Social Determinants of Health (SDOH) Social History: SDOH Screenings   Food Insecurity: No  Food Insecurity (01/25/2022)  Housing: Low Risk  (01/25/2022)  Transportation Needs: No Transportation Needs (01/25/2022)  Utilities: Not At Risk (01/25/2022)  Depression (PHQ2-9): Low Risk  (02/13/2022)  Tobacco Use: Medium Risk (11/06/2022)   SDOH Interventions:     Readmission Risk Interventions    01/26/2022   11:24 AM  Readmission Risk Prevention Plan  Transportation Screening Complete  PCP or Specialist Appt within 3-5 Days Complete  HRI or Home Care Consult Complete  Social Work Consult for Recovery Care Planning/Counseling Complete  Palliative Care Screening Not Applicable  Medication Review Oceanographer) Complete

## 2022-11-13 NOTE — Telephone Encounter (Signed)
Tried to reach patient for last time no asnwer no vm  . Will cancel order and send shoes back.

## 2022-11-21 ENCOUNTER — Ambulatory Visit: Payer: Medicare Other

## 2022-11-21 NOTE — Progress Notes (Signed)
Shoes were too small today return completed and order for new shoes x 2pr to try was placed  Addison Bailey Cped, CFo, CFm

## 2022-11-27 ENCOUNTER — Telehealth: Payer: Self-pay | Admitting: Podiatry

## 2022-11-27 NOTE — Telephone Encounter (Signed)
Per Shanda Bumps at [facility] patient son does not want to proceed with diabetic shoes , send them back .   Order cancelled  11/27/22

## 2022-12-04 ENCOUNTER — Inpatient Hospital Stay (HOSPITAL_COMMUNITY)
Admission: EM | Admit: 2022-12-04 | Discharge: 2022-12-08 | DRG: 378 | Disposition: A | Discharge status: Skilled Nursing Facility | Payer: Medicare Other | Source: Skilled Nursing Facility | Attending: Internal Medicine | Admitting: Internal Medicine

## 2022-12-04 DIAGNOSIS — Z794 Long term (current) use of insulin: Secondary | ICD-10-CM

## 2022-12-04 DIAGNOSIS — K5521 Angiodysplasia of colon with hemorrhage: Secondary | ICD-10-CM

## 2022-12-04 DIAGNOSIS — K922 Gastrointestinal hemorrhage, unspecified: Secondary | ICD-10-CM | POA: Diagnosis not present

## 2022-12-04 DIAGNOSIS — K222 Esophageal obstruction: Secondary | ICD-10-CM

## 2022-12-04 DIAGNOSIS — L97529 Non-pressure chronic ulcer of other part of left foot with unspecified severity: Secondary | ICD-10-CM | POA: Diagnosis present

## 2022-12-04 DIAGNOSIS — E1122 Type 2 diabetes mellitus with diabetic chronic kidney disease: Secondary | ICD-10-CM | POA: Diagnosis present

## 2022-12-04 DIAGNOSIS — E11649 Type 2 diabetes mellitus with hypoglycemia without coma: Secondary | ICD-10-CM | POA: Diagnosis present

## 2022-12-04 DIAGNOSIS — I129 Hypertensive chronic kidney disease with stage 1 through stage 4 chronic kidney disease, or unspecified chronic kidney disease: Secondary | ICD-10-CM | POA: Diagnosis present

## 2022-12-04 DIAGNOSIS — R195 Other fecal abnormalities: Secondary | ICD-10-CM | POA: Diagnosis not present

## 2022-12-04 DIAGNOSIS — E11621 Type 2 diabetes mellitus with foot ulcer: Secondary | ICD-10-CM | POA: Diagnosis present

## 2022-12-04 DIAGNOSIS — E1151 Type 2 diabetes mellitus with diabetic peripheral angiopathy without gangrene: Secondary | ICD-10-CM | POA: Diagnosis present

## 2022-12-04 DIAGNOSIS — J449 Chronic obstructive pulmonary disease, unspecified: Secondary | ICD-10-CM | POA: Diagnosis present

## 2022-12-04 DIAGNOSIS — K31819 Angiodysplasia of stomach and duodenum without bleeding: Secondary | ICD-10-CM | POA: Diagnosis not present

## 2022-12-04 DIAGNOSIS — K449 Diaphragmatic hernia without obstruction or gangrene: Secondary | ICD-10-CM

## 2022-12-04 DIAGNOSIS — K31811 Angiodysplasia of stomach and duodenum with bleeding: Principal | ICD-10-CM | POA: Diagnosis present

## 2022-12-04 DIAGNOSIS — Z7902 Long term (current) use of antithrombotics/antiplatelets: Secondary | ICD-10-CM | POA: Diagnosis not present

## 2022-12-04 DIAGNOSIS — Z8249 Family history of ischemic heart disease and other diseases of the circulatory system: Secondary | ICD-10-CM

## 2022-12-04 DIAGNOSIS — Z833 Family history of diabetes mellitus: Secondary | ICD-10-CM | POA: Diagnosis not present

## 2022-12-04 DIAGNOSIS — Z87891 Personal history of nicotine dependence: Secondary | ICD-10-CM

## 2022-12-04 DIAGNOSIS — Z7982 Long term (current) use of aspirin: Secondary | ICD-10-CM

## 2022-12-04 DIAGNOSIS — Z66 Do not resuscitate: Secondary | ICD-10-CM | POA: Diagnosis present

## 2022-12-04 DIAGNOSIS — N1832 Chronic kidney disease, stage 3b: Secondary | ICD-10-CM | POA: Diagnosis present

## 2022-12-04 DIAGNOSIS — D62 Acute posthemorrhagic anemia: Secondary | ICD-10-CM | POA: Diagnosis present

## 2022-12-04 DIAGNOSIS — Z79899 Other long term (current) drug therapy: Secondary | ICD-10-CM | POA: Diagnosis not present

## 2022-12-04 DIAGNOSIS — D539 Nutritional anemia, unspecified: Secondary | ICD-10-CM | POA: Diagnosis present

## 2022-12-04 DIAGNOSIS — Z89431 Acquired absence of right foot: Secondary | ICD-10-CM | POA: Diagnosis not present

## 2022-12-04 DIAGNOSIS — M79662 Pain in left lower leg: Secondary | ICD-10-CM | POA: Diagnosis not present

## 2022-12-04 DIAGNOSIS — D649 Anemia, unspecified: Secondary | ICD-10-CM | POA: Diagnosis not present

## 2022-12-04 DIAGNOSIS — Z7985 Long-term (current) use of injectable non-insulin antidiabetic drugs: Secondary | ICD-10-CM | POA: Diagnosis not present

## 2022-12-04 DIAGNOSIS — E7800 Pure hypercholesterolemia, unspecified: Secondary | ICD-10-CM | POA: Diagnosis present

## 2022-12-04 DIAGNOSIS — K5909 Other constipation: Secondary | ICD-10-CM | POA: Diagnosis present

## 2022-12-04 DIAGNOSIS — Z8673 Personal history of transient ischemic attack (TIA), and cerebral infarction without residual deficits: Secondary | ICD-10-CM

## 2022-12-04 LAB — CBC WITH DIFFERENTIAL/PLATELET
Abs Immature Granulocytes: 0.03 10*3/uL (ref 0.00–0.07)
Abs Immature Granulocytes: 0.03 10*3/uL (ref 0.00–0.07)
Basophils Absolute: 0.1 10*3/uL (ref 0.0–0.1)
Basophils Absolute: 0.1 10*3/uL (ref 0.0–0.1)
Basophils Relative: 1 %
Basophils Relative: 1 %
Eosinophils Absolute: 0.9 10*3/uL — ABNORMAL HIGH (ref 0.0–0.5)
Eosinophils Absolute: 0.8 10*3/uL — ABNORMAL HIGH (ref 0.0–0.5)
Eosinophils Relative: 12 %
Eosinophils Relative: 11 %
HCT: 24.8 % — ABNORMAL LOW (ref 39.0–52.0)
HCT: 23.6 % — ABNORMAL LOW (ref 39.0–52.0)
Hemoglobin: 7.8 g/dL — ABNORMAL LOW (ref 13.0–17.0)
Hemoglobin: 7.3 g/dL — ABNORMAL LOW (ref 13.0–17.0)
Immature Granulocytes: 0 %
Immature Granulocytes: 0 %
Lymphocytes Relative: 40 %
Lymphocytes Relative: 40 %
Lymphs Abs: 3 10*3/uL (ref 0.7–4.0)
Lymphs Abs: 3.1 10*3/uL (ref 0.7–4.0)
MCH: 31.3 pg (ref 26.0–34.0)
MCH: 31.1 pg (ref 26.0–34.0)
MCHC: 31.5 g/dL (ref 30.0–36.0)
MCHC: 30.9 g/dL (ref 30.0–36.0)
MCV: 99.6 fL (ref 80.0–100.0)
MCV: 100.4 fL — ABNORMAL HIGH (ref 80.0–100.0)
Monocytes Absolute: 0.7 10*3/uL (ref 0.1–1.0)
Monocytes Absolute: 0.8 10*3/uL (ref 0.1–1.0)
Monocytes Relative: 10 %
Monocytes Relative: 10 %
Neutro Abs: 2.7 10*3/uL (ref 1.7–7.7)
Neutro Abs: 3 10*3/uL (ref 1.7–7.7)
Neutrophils Relative %: 37 %
Neutrophils Relative %: 38 %
Platelets: 269 10*3/uL (ref 150–400)
Platelets: 260 10*3/uL (ref 150–400)
RBC: 2.49 MIL/uL — ABNORMAL LOW (ref 4.22–5.81)
RBC: 2.35 MIL/uL — ABNORMAL LOW (ref 4.22–5.81)
RDW: 14.9 % (ref 11.5–15.5)
RDW: 14.9 % (ref 11.5–15.5)
WBC: 7.4 10*3/uL (ref 4.0–10.5)
WBC: 7.9 10*3/uL (ref 4.0–10.5)
nRBC: 0 % (ref 0.0–0.2)
nRBC: 0 % (ref 0.0–0.2)

## 2022-12-04 LAB — COMPREHENSIVE METABOLIC PANEL
ALT: 13 U/L (ref 0–44)
AST: 21 U/L (ref 15–41)
Albumin: 3 g/dL — ABNORMAL LOW (ref 3.5–5.0)
Alkaline Phosphatase: 55 U/L (ref 38–126)
Anion gap: 10 (ref 5–15)
BUN: 28 mg/dL — ABNORMAL HIGH (ref 8–23)
CO2: 22 mmol/L (ref 22–32)
Calcium: 8.5 mg/dL — ABNORMAL LOW (ref 8.9–10.3)
Chloride: 102 mmol/L (ref 98–111)
Creatinine, Ser: 1.81 mg/dL — ABNORMAL HIGH (ref 0.61–1.24)
GFR, Estimated: 36 mL/min — ABNORMAL LOW (ref >60)
Glucose, Bld: 136 mg/dL — ABNORMAL HIGH (ref 70–99)
Potassium: 4 mmol/L (ref 3.5–5.1)
Sodium: 134 mmol/L — ABNORMAL LOW (ref 135–145)
Total Bilirubin: 0.9 mg/dL (ref 0.3–1.2)
Total Protein: 7.7 g/dL (ref 6.5–8.1)

## 2022-12-04 LAB — POC OCCULT BLOOD, ED: Fecal Occult Bld: POSITIVE — AB

## 2022-12-04 LAB — PROTIME-INR
INR: 1 (ref 0.8–1.2)
Prothrombin Time: 13.7 seconds (ref 11.4–15.2)

## 2022-12-04 NOTE — ED Provider Notes (Signed)
[place] EMERGENCY DEPARTMENT AT [facility] Provider Note  CSN: [id number] Arrival date & time: 12/04/22 1628  Chief Complaint(s) No chief complaint on file.  HPI Justin [name] is a 88 y.o. male history of hypertension, hyperlipidemia, prior stroke, chronic diabetic wounds, presenting to the emergency department with low lab value.  Patient had labs drawn today at his nursing facility with a hemoglobin of 6.4 and was sent in for further evaluation.  Patient reports overall he feels at baseline.  Denies any specific complaints.  Does not know if he has had any black stools.  No vomiting.  No lightheadedness or dizziness.  Does not ambulate at baseline and only gets around with a wheelchair.   Past Medical History Past Medical History:  Diagnosis Date   Acute metabolic encephalopathy 05/24/2021   Arthritis    Diabetes mellitus    HTN (hypertension)    Hypercholesteremia    Iron deficiency    Pancreatitis, acute 06/02/2012   Diagnosed 05/2012    Personal history of colonic adenomas 04/15/2013   04/15/2013 2 diminutive polyps     Stroke [facility])    TIA (transient ischemic attack)    Patient Active Problem List   Diagnosis Date Noted   GI bleed 12/04/2022   Toxic metabolic encephalopathy 11/07/2022   Acute metabolic encephalopathy 11/06/2022   UTI (urinary tract infection) 11/06/2022   Wound of lower extremity 11/06/2022   GERD without esophagitis 01/26/2022   Osteomyelitis of second toe of right foot ([name]) 01/25/2022   Acute-on-chronic kidney injury ([name])    Altered mental status    PAD (peripheral artery disease) ([name]) 12/09/2021   Balanitis 12/09/2021   Pressure injury of skin 11/25/2021   Diabetic foot ([name]) 11/16/2021   Diabetic foot infection ([name]) 11/15/2021   Intertrigo 11/15/2021   History of CVA (cerebrovascular accident) 05/21/2021   Chronic kidney disease, stage 3b ([name]) 05/21/2021   Mixed diabetic hyperlipidemia associated with type 2 diabetes mellitus  ([name]) 05/21/2021   Polyneuropathy due to type 2 diabetes mellitus ([name]) 05/21/2021   Leukocytosis 05/21/2021   Type 2 diabetes mellitus with stage 3b chronic kidney disease, with long-term current use of insulin ([name]) 09/29/2015   Arthritis 09/06/2015   Diabetic peripheral neuropathy ([name]) 08/17/2015   OA (osteoarthritis) of knee 08/17/2015   Primary osteoarthritis of both knees 08/11/2015   Primary osteoarthritis of left hip 08/11/2015   Hammer toe, acquired 09/30/2013   Personal history of colonic adenomas 04/15/2013   Chronic pancreatitis ([name]) 02/26/2013   Anemia of chronic disease 02/26/2013   Goals of care, counseling/discussion 02/26/2013   Essential hypertension 12/14/2012   Testosterone deficiency 10/22/2011   Headache above the eye region 02/22/2011   Coronary artery disease 02/22/2011   Home Medication(s) Prior to Admission medications   Medication Sig Start Date End Date Taking? Authorizing Provider  acetaminophen (TYLENOL) 325 MG tablet Take 650 mg by mouth in the morning.   Yes [provider]  aspirin 81 MG chewable tablet Chew 81 mg by mouth daily.   Yes [provider]  atorvastatin (LIPITOR) 40 MG tablet Take 40 mg by mouth at bedtime. 11/03/21  Yes [provider]  BREO ELLIPTA 100-25 MCG/ACT AEPB Inhale 1 puff into the lungs daily. 09/14/22  Yes [provider]  clopidogrel (PLAVIX) 75 MG tablet Take 1 tablet (75 mg total) by mouth daily with breakfast. 11/28/21  Yes Rhetta Mura, MD  [employer] SALINE NOSE SPRAY NA Place 2 sprays into the nose 2 (two) times daily.  Yes [provider]  divalproex (DEPAKOTE SPRINKLE) 125 MG capsule Take 125 mg by mouth 2 (two) times daily. 10/27/22  Yes [provider]  docusate sodium (COLACE) 100 MG capsule Take 100 mg by mouth 2 (two) times daily.   Yes [provider]  Dulaglutide 1.5 MG/0.5ML SOPN Inject 1.5 mg into the skin once a week. tuesday   Yes [provider]  ferrous gluconate (FERGON) 324 MG tablet Take 324 mg by mouth daily.   Yes [provider]  gabapentin (NEURONTIN) 100 MG capsule Take 100 mg by mouth 2 (two) times daily. 10/29/22  Yes [provider]  glucagon (GLUCAGON EMERGENCY) 1 MG injection Inject 1 mg into the muscle once as needed. Patient taking differently: Inject 1 mg into the muscle once as needed (for low blood sugar). 09/29/15  Yes Carlus Pavlov, MD  hydrOXYzine (ATARAX) 10 MG tablet Take 10 mg by mouth 2 (two) times daily. hold for sedation   Yes [provider]  insulin glargine, 2 Unit Dial, (TOUJEO MAX SOLOSTAR) 300 UNIT/ML Solostar Pen Inject 19 Units into the skin daily.   Yes [provider]  Ipratropium-Albuterol (COMBIVENT) 20-100 MCG/ACT AERS respimat Inhale 1 puff into the lungs every 6 (six) hours as needed for wheezing. Patient taking differently: Inhale 1 puff into the lungs every 6 (six) hours as needed (for COPD). 05/25/21  Yes Tyrone Nine, MD  isosorbide mononitrate (ISMO) 10 MG tablet Take 10 mg by mouth at bedtime.   Yes [provider]  loratadine (CLARITIN) 10 MG tablet Take 10 mg by mouth daily.   Yes [provider]  NOVOLOG FLEXPEN 100 UNIT/ML FlexPen Inject 0-8 Units into the skin See admin instructions. Inject 0-8 units into the skin before lunch and supper, per sliding scale: if is BS 70-200 give 0 units, if BS is 201-250 give 2 units, if BS is 251-300 give 4 units, if BS is 301-350 give 6 units, if BS is 351-400 give 8 units. If blood sugar is greater than 400, call MD. 10/18/21  Yes [provider]  oxycodone (OXY-IR) 5 MG capsule Take 5 mg by mouth every 8 (eight) hours as needed for pain.   Yes [provider]  oxymetazoline (AFRIN) 0.05 % nasal spray Place 2 sprays into both nostrils 2 (two) times daily as needed for congestion.   Yes [provider]  Pancrelipase, Lip-Prot-Amyl, (CREON) 24000-76000 units  CPEP Take 1 capsule by mouth 3 (three) times daily. With 4-6 ounces of water   Yes [provider]  Pollen Extracts (PROSTAT PO) Take 30 mLs by mouth 3 (three) times daily.   Yes [provider]  repaglinide (PRANDIN) 0.5 MG tablet Take 0.5 mg by mouth 2 (two) times daily before a meal. 10/15/22  Yes [provider]  Skin Protectants, Misc. (MINERIN CREME EX) Apply 1 Application topically daily. Both legs and feet.   Yes [provider]  Alcohol Swabs PADS Use to check blood sugar 3 times daily. Dx: E10.9 07/21/15   Trena Platt D, PA  Blood Glucose Calibration (ACCU-CHEK AVIVA) SOLN Use to check blood sugar 3 times daily. Dx: E10.9 07/21/15   Trena Platt D, PA  Blood Glucose Monitoring Suppl (ACCU-CHEK AVIVA PLUS) w/Device KIT Use to check blood sugar 3 times daily. Dx: E10.9 09/27/15   Ethelda Chick, MD  cefdinir (OMNICEF) 300 MG capsule Take 1 capsule (300 mg total) by mouth every evening. Patient not taking: Reported on 12/04/2022 11/09/22  Lonia Blood, MD  glucose blood (ACCU-CHEK AVIVA PLUS) test strip Use to check blood sugar 3 times daily. Dx: E10.9 11/17/15   Carlus Pavlov, MD  Insulin Pen Needle 32G X 4 MM MISC Use as directed to inject insulin tidwc 12/14/12   Sherren Mocha, MD  Lancets (ACCU-CHEK SOFT [facility]) lancets Use to check blood sugar 3 times daily. Dx: E10.9 07/21/15   Garnetta Buddy, PA  Spacer/Aero-Holding [facility] Use as directed with combivent. Ok to dispense any spacer that works with the inhaler being rxed to pt. 09/20/15   Sherren Mocha, MD                                                                                                                                    Past Surgical History Past Surgical History:  Procedure Laterality Date   ABDOMINAL AORTOGRAM W/LOWER EXTREMITY Right 11/24/2021   Procedure: ABDOMINAL AORTOGRAM W/LOWER EXTREMITY;  Surgeon: Leonie Douglas, MD;  Location: [facility] INVASIVE CV LAB;  Service:  Cardiovascular;  Laterality: Right;   AMPUTATION TOE Right 11/26/2021   Procedure: AMPUTATION TOE;  Surgeon: Vivi Barrack, DPM;  Location: [facility] OR;  Service: Podiatry;  Laterality: Right;   ARTERY BIOPSY  02/26/2011   Procedure: BIOPSY TEMPORAL ARTERY;  Surgeon: Iona Coach, MD;  Location: [facility];  Service: General;  Laterality: Left;  Left temporal artery biospy   BACK SURGERY  1985   CHOLECYSTECTOMY     COLONOSCOPY     EUS     NECK SURGERY  1985   PERIPHERAL VASCULAR BALLOON ANGIOPLASTY Right 11/24/2021   Procedure: PERIPHERAL VASCULAR BALLOON ANGIOPLASTY;  Surgeon: Leonie Douglas, MD;  Location: [facility] INVASIVE CV LAB;  Service: Cardiovascular;  Laterality: Right;  POPLITEAL AND PERONEAL   TEMPORAL ARTERY BIOPSY / LIGATION  02/26/2011   left side   TRANSMETATARSAL AMPUTATION Right 01/28/2022   Procedure: TRANSMETATARSAL AMPUTATION; ACHILLES TENDON LENGTHENING;  Surgeon: Candelaria Stagers, DPM;  Location: [facility] ORS;  Service: Podiatry;  Laterality: Right;  R foot Transmetatarsal amputation, possible tendo achilles lengthening   UPPER GASTROINTESTINAL ENDOSCOPY     Family History Family History  Problem Relation Age of Onset   Heart disease Mother    Amblyopia Father    Diabetes Sister        x 2   Heart attack Sister     Social History Social History   Tobacco Use   Smoking status: Former    Current packs/day: 0.00    Types: Cigarettes    Start date: 04/10/1955    Quit date: 04/09/1990    Years since quitting: 32.6   Smokeless tobacco: Never  Substance Use Topics   Alcohol use: No    Comment: "per pt stopped drinking in the 90's"   Drug use: No   Allergies Patient has no known allergies.  Review of Systems Review of Systems  All other  systems reviewed and are negative.   Physical Exam Vital Signs  I have reviewed the triage vital signs BP 133/66 (BP Location: Right Arm)   Pulse 77   Temp 99 F (37.2 C) (Oral)   Resp 18   SpO2 100%   Physical Exam Vitals and nursing note reviewed.  Constitutional:      General: He is not in acute distress.    Appearance: Normal appearance.  HENT:     Mouth/Throat:     Mouth: Mucous membranes are moist.  Eyes:     Conjunctiva/sclera: Conjunctivae normal.  Cardiovascular:     Rate and Rhythm: Normal rate and regular rhythm.  Pulmonary:     Effort: Pulmonary effort is normal. No respiratory distress.     Breath sounds: Normal breath sounds.  Abdominal:     General: Abdomen is flat.     Palpations: Abdomen is soft.     Tenderness: There is no abdominal tenderness.  Genitourinary:    Comments: Chaperoned by RN. Brown stool. No melena or gross blood Musculoskeletal:     Right lower leg: No edema.     Left lower leg: No edema.  Skin:    General: Skin is warm and dry.     Capillary Refill: Capillary refill takes less than 2 seconds.  Neurological:     Mental Status: He is alert and oriented to person, place, and time. Mental status is at baseline.  Psychiatric:        Mood and Affect: Mood normal.        Behavior: Behavior normal.     ED Results and Treatments Labs (all labs ordered are listed, but only abnormal results are displayed) Labs Reviewed  COMPREHENSIVE METABOLIC PANEL - Abnormal; Notable for the following components:      Result Value   Sodium 134 (*)    Glucose, Bld 136 (*)    BUN 28 (*)    Creatinine, Ser 1.81 (*)    Calcium 8.5 (*)    Albumin 3.0 (*)    GFR, Estimated 36 (*)    All other components within normal limits  CBC WITH DIFFERENTIAL/PLATELET - Abnormal; Notable for the following components:   RBC 2.49 (*)    Hemoglobin 7.8 (*)    HCT 24.8 (*)    Eosinophils Absolute 0.9 (*)    All other components within normal limits  CBC WITH DIFFERENTIAL/PLATELET - Abnormal; Notable for the following components:   RBC 2.35 (*)    Hemoglobin 7.3 (*)    HCT 23.6 (*)    MCV 100.4 (*)    Eosinophils Absolute 0.8 (*)    All other components within normal  limits  POC OCCULT BLOOD, ED - Abnormal; Notable for the following components:   Fecal Occult Bld POSITIVE (*)    All other components within normal limits  PROTIME-INR  TYPE AND SCREEN  ABO/RH  Radiology No results found.  Pertinent labs & imaging results that were available during my care of the patient were reviewed by me and considered in my medical decision making (see MDM for details).  Medications Ordered in ED Medications - No data to display                                                                                                                                   Procedures Procedures  (including critical care time)  Medical Decision Making / ED Course   MDM:  23 male presenting to the emergency department abnormal labs.  Patient well-appearing, physical exam unremarkable, rectal exam with brown stool.  Rechecked hemoglobin here which is 7.8, only slightly lower than previous.  Occult blood was positive on rectal examination.  No clear alternative medication which would cause false positive occult blood such as iron.  On recheck hemoglobin 7.3.  Patient still asymptomatic and hemodynamically stable.  Given slight drop in hemoglobin, positive occult blood, discussed with hospitalist who will admit patient for observation, repeat CBC. Not on anticoagulation.       Additional history obtained: -Additional history obtained from ems -External records from outside source obtained and reviewed including: Chart review including previous notes, labs, imaging, consultation notes including prior lab results   Lab Tests: -I ordered, reviewed, and interpreted labs.   The pertinent results include:   Labs Reviewed  COMPREHENSIVE METABOLIC PANEL - Abnormal; Notable for the following components:      Result Value   Sodium 134 (*)    Glucose, Bld 136 (*)     BUN 28 (*)    Creatinine, Ser 1.81 (*)    Calcium 8.5 (*)    Albumin 3.0 (*)    GFR, Estimated 36 (*)    All other components within normal limits  CBC WITH DIFFERENTIAL/PLATELET - Abnormal; Notable for the following components:   RBC 2.49 (*)    Hemoglobin 7.8 (*)    HCT 24.8 (*)    Eosinophils Absolute 0.9 (*)    All other components within normal limits  CBC WITH DIFFERENTIAL/PLATELET - Abnormal; Notable for the following components:   RBC 2.35 (*)    Hemoglobin 7.3 (*)    HCT 23.6 (*)    MCV 100.4 (*)    Eosinophils Absolute 0.8 (*)    All other components within normal limits  POC OCCULT BLOOD, ED - Abnormal; Notable for the following components:   Fecal Occult Bld POSITIVE (*)    All other components within normal limits  PROTIME-INR  TYPE AND SCREEN  ABO/RH    Notable for positive FOBT, hgb 7.8->7.3, CKD  EKG   EKG Interpretation Date/Time:    Ventricular Rate:    PR Interval:    QRS Duration:    QT Interval:    QTC Calculation:   R Axis:      Text Interpretation:  Medicines ordered and prescription drug management: No orders of the defined types were placed in this encounter.   -I have reviewed the patients home medicines and have made adjustments as needed   Consultations Obtained: I requested consultation with the hospitalist,  and discussed lab and imaging findings as well as pertinent plan - they recommend: admission   Cardiac Monitoring: The patient was maintained on a cardiac monitor.  I personally viewed and interpreted the cardiac monitored which showed an underlying rhythm of: NSR  Social Determinants of Health:  Diagnosis or treatment significantly limited by social determinants of health: former smoker   Reevaluation: After the interventions noted above, I reevaluated the patient and found that their symptoms have stayed the same  Co morbidities that complicate the patient evaluation  Past Medical History:  Diagnosis  Date   Acute metabolic encephalopathy 05/24/2021   Arthritis    Diabetes mellitus    HTN (hypertension)    Hypercholesteremia    Iron deficiency    Pancreatitis, acute 06/02/2012   Diagnosed 05/2012    Personal history of colonic adenomas 04/15/2013   04/15/2013 2 diminutive polyps     Stroke ([name])    TIA (transient ischemic attack)       Dispostion: Disposition decision including need for hospitalization was considered, and patient admitted to the hospital.    Final Clinical Impression(s) / ED Diagnoses Final diagnoses:  Occult GI bleeding     This chart was dictated using voice recognition software.  Despite best efforts to proofread,  errors can occur which can change the documentation meaning.    Lonell Grandchild, MD 12/04/22 [phone]

## 2022-12-04 NOTE — ED Triage Notes (Signed)
Pt BIb [facility] from [facility] place for a lab value of Hgb 6.4.  Pt does not endorse weakness or SOB. Pt is A&0x1 per EMS.  Pt answered all orientation questions for me  140/70 97% 70 97.5 CBG 197

## 2022-12-04 NOTE — H&P (Signed)
History and Physical   [facility] - [place] @ [facility] Admission History and Physical [employer], D.O.    Patient Name: Justin [name] MR#: [id number] Date of Birth: May 14, 1934 Date of Admission: 12/04/2022  Referring MD/NP/PA: Dr. Suezanne Jacquet Primary Care Physician: Center, [facility]  Chief Complaint: No chief complaint on file. Please note the entire history is obtained from the patient's emergency department chart, emergency department provider Patient's personal history is limited by poor historian   HPI: Justin [name] is a 88 y.o. male with a known history of arthritis, CKD, diabetes with chronic wounds, hypertension, hyperlipidemia, iron deficiency, CVA presents to the emergency department for evaluation of anemia.  Patient had routine labs drawn at his living facility today and was found to have a hemoglobin of 6.4.  Patient denies all complaints - states that he feels fine.   Patient denies fevers/chills, weakness, dizziness, chest pain, shortness of breath, N/V/C/D, abdominal pain, dysuria/frequency, changes in mental status.   EMS/ED Course: Patient received thing in the ER. Medical admission has been requested for further management of acute on chronic anemia, macrocytic likely secondary to GI bleed  Review of Systems:  Denies all complaints. CONSTITUTIONAL: No fever/chills, fatigue, weakness, weight gain/loss, headache. EYES: No blurry or double vision. ENT: No tinnitus, postnasal drip, redness or soreness of the oropharynx. RESPIRATORY: No cough, dyspnea, wheeze.  No hemoptysis.  CARDIOVASCULAR: No chest pain, palpitations, syncope, orthopnea. No lower extremity edema.  GASTROINTESTINAL: No nausea, vomiting, abdominal pain, diarrhea, constipation.  No hematemesis, melena or hematochezia. GENITOURINARY: No dysuria, frequency, hematuria. ENDOCRINE: No polyuria or nocturia. No heat or cold intolerance. HEMATOLOGY: No anemia, bruising,  bleeding. INTEGUMENTARY: No rashes, ulcers, lesions. MUSCULOSKELETAL: No arthritis, gout. NEUROLOGIC: No numbness, tingling, ataxia, seizure-type activity, weakness. PSYCHIATRIC: No anxiety, depression, insomnia.   Past Medical History:  Diagnosis Date   Acute metabolic encephalopathy 05/24/2021   Arthritis    Diabetes mellitus    HTN (hypertension)    Hypercholesteremia    Iron deficiency    Pancreatitis, acute 06/02/2012   Diagnosed 05/2012    Personal history of colonic adenomas 04/15/2013   04/15/2013 2 diminutive polyps     Stroke [facility])    TIA (transient ischemic attack)     Past Surgical History:  Procedure Laterality Date   ABDOMINAL AORTOGRAM W/LOWER EXTREMITY Right 11/24/2021   Procedure: ABDOMINAL AORTOGRAM W/LOWER EXTREMITY;  Surgeon: Leonie Douglas, MD;  [facility];  Service: Cardiovascular;  Laterality: Right;   AMPUTATION TOE Right 11/26/2021   Procedure: AMPUTATION TOE;  Surgeon: Vivi Barrack, DPM;  Location: [facility];  Service: Podiatry;  Laterality: Right;   ARTERY BIOPSY  02/26/2011   Procedure: BIOPSY TEMPORAL ARTERY;  Surgeon: Iona Coach, MD;  Location: [facility];  Service: General;  Laterality: Left;  Left temporal artery biospy   BACK SURGERY  1985   CHOLECYSTECTOMY     COLONOSCOPY     EUS     NECK SURGERY  1985   PERIPHERAL VASCULAR BALLOON ANGIOPLASTY Right 11/24/2021   Procedure: PERIPHERAL VASCULAR BALLOON ANGIOPLASTY;  Surgeon: Leonie Douglas, MD;  [facility];  Service: Cardiovascular;  Laterality: Right;  POPLITEAL AND PERONEAL   TEMPORAL ARTERY BIOPSY / LIGATION  02/26/2011   left side   TRANSMETATARSAL AMPUTATION Right 01/28/2022   Procedure: TRANSMETATARSAL AMPUTATION; ACHILLES TENDON LENGTHENING;  Surgeon: Candelaria Stagers, DPM;  Location: [facility];  Service: Podiatry;  Laterality: Right;  R foot Transmetatarsal amputation, possible tendo achilles  lengthening   UPPER GASTROINTESTINAL  ENDOSCOPY       reports that he quit smoking about 32 years ago. His smoking use included cigarettes. He started smoking about 67 years ago. He has never used smokeless tobacco. He reports that he does not drink alcohol and does not use drugs.  No Known Allergies  Family History  Problem Relation Age of Onset   Heart disease Mother    Amblyopia Father    Diabetes Sister        x 2   Heart attack Sister     Prior to Admission medications   Medication Sig Start Date End Date Taking? Authorizing Provider  acetaminophen (TYLENOL) 325 MG tablet Take 650 mg by mouth in the morning.   Yes [provider]  aspirin 81 MG chewable tablet Chew 81 mg by mouth daily.   Yes [provider]  atorvastatin (LIPITOR) 40 MG tablet Take 40 mg by mouth at bedtime. 11/03/21  Yes [provider]  BREO ELLIPTA 100-25 MCG/ACT AEPB Inhale 1 puff into the lungs daily. 09/14/22  Yes [provider]  clopidogrel (PLAVIX) 75 MG tablet Take 1 tablet (75 mg total) by mouth daily with breakfast. 11/28/21  Yes Rhetta Mura, MD  [facility] SALINE NOSE SPRAY NA Place 2 sprays into the nose 2 (two) times daily.   Yes [provider]  divalproex (DEPAKOTE SPRINKLE) 125 MG capsule Take 125 mg by mouth 2 (two) times daily. 10/27/22  Yes [provider]  docusate sodium (COLACE) 100 MG capsule Take 100 mg by mouth 2 (two) times daily.   Yes [provider]  Dulaglutide 1.5 MG/0.5ML SOPN Inject 1.5 mg into the skin once a week. tuesday   Yes [provider]  ferrous gluconate (FERGON) 324 MG tablet Take 324 mg by mouth daily.   Yes [provider]  gabapentin (NEURONTIN) 100 MG capsule Take 100 mg by mouth 2 (two) times daily. 10/29/22  Yes [provider]  glucagon (GLUCAGON EMERGENCY) 1 MG injection Inject 1 mg into the muscle once as needed. Patient taking differently: Inject 1 mg into the muscle once as needed (for low blood sugar). 09/29/15   Yes Carlus Pavlov, MD  hydrOXYzine (ATARAX) 10 MG tablet Take 10 mg by mouth 2 (two) times daily. hold for sedation   Yes [provider]  insulin glargine, 2 Unit Dial, (TOUJEO MAX SOLOSTAR) 300 UNIT/ML Solostar Pen Inject 19 Units into the skin daily.   Yes [provider]  Ipratropium-Albuterol (COMBIVENT) 20-100 MCG/ACT AERS respimat Inhale 1 puff into the lungs every 6 (six) hours as needed for wheezing. Patient taking differently: Inhale 1 puff into the lungs every 6 (six) hours as needed (for COPD). 05/25/21  Yes Tyrone Nine, MD  isosorbide mononitrate (ISMO) 10 MG tablet Take 10 mg by mouth at bedtime.   Yes [provider]  loratadine (CLARITIN) 10 MG tablet Take 10 mg by mouth daily.   Yes [provider]  NOVOLOG FLEXPEN 100 UNIT/ML FlexPen Inject 0-8 Units into the skin See admin instructions. Inject 0-8 units into the skin before lunch and supper, per sliding scale: if is BS 70-200 give 0 units, if BS is 201-250 give 2 units, if BS is 251-300 give 4 units, if BS is 301-350 give 6 units, if BS is 351-400 give 8 units. If blood sugar is greater than 400, call MD. 10/18/21  Yes [provider]  oxycodone (OXY-IR) 5 MG capsule Take 5 mg by  mouth every 8 (eight) hours as needed for pain.   Yes [provider]  oxymetazoline (AFRIN) 0.05 % nasal spray Place 2 sprays into both nostrils 2 (two) times daily as needed for congestion.   Yes [provider]  Pancrelipase, Lip-Prot-Amyl, (CREON) 24000-76000 units CPEP Take 1 capsule by mouth 3 (three) times daily. With 4-6 ounces of water   Yes [provider]  Pollen Extracts (PROSTAT PO) Take 30 mLs by mouth 3 (three) times daily.   Yes [provider]  repaglinide (PRANDIN) 0.5 MG tablet Take 0.5 mg by mouth 2 (two) times daily before a meal. 10/15/22  Yes [provider]  Skin Protectants, Misc. (MINERIN CREME EX) Apply 1 Application topically daily. Both  legs and feet.   Yes [provider]  Alcohol Swabs PADS Use to check blood sugar 3 times daily. Dx: E10.9 07/21/15   Trena Platt D, PA  Blood Glucose Calibration (ACCU-CHEK AVIVA) SOLN Use to check blood sugar 3 times daily. Dx: E10.9 07/21/15   Trena Platt D, PA  Blood Glucose Monitoring Suppl (ACCU-CHEK AVIVA PLUS) w/Device KIT Use to check blood sugar 3 times daily. Dx: E10.9 09/27/15   Ethelda Chick, MD  cefdinir (OMNICEF) 300 MG capsule Take 1 capsule (300 mg total) by mouth every evening. Patient not taking: Reported on 12/04/2022 11/09/22   Lonia Blood, MD  glucose blood (ACCU-CHEK AVIVA PLUS) test strip Use to check blood sugar 3 times daily. Dx: E10.9 11/17/15   Carlus Pavlov, MD  Insulin Pen Needle 32G X 4 MM MISC Use as directed to inject insulin tidwc 12/14/12   Sherren Mocha, MD  Lancets (ACCU-CHEK SOFT [facility]) lancets Use to check blood sugar 3 times daily. Dx: E10.9 07/21/15   Garnetta Buddy, PA  Spacer/Aero-Holding [facility] Use as directed with combivent. Ok to dispense any spacer that works with the inhaler being rxed to pt. 09/20/15   Sherren Mocha, MD    Physical Exam: Vitals:   12/04/22 1635 12/04/22 1930 12/04/22 2030 12/04/22 2230  BP: (!) 148/60 139/67 (!) 154/67 133/66  Pulse: 80 74 75 77  Resp: 15 20 19 18   Temp: (!) 97.5 F (36.4 C)   99 F (37.2 C)  TempSrc: Oral   Oral  SpO2: 100% 100% 100% 100%    GENERAL: 88 y.o.-year-old whtie male patient, well-developed, well-nourished sitting up in the bed in no acute distress.  Pleasant and cooperative.   HEENT: Head atraumatic, normocephalic. Pupils equal. Mucus membranes moist. NECK: Supple. No JVD. CHEST: Normal breath sounds bilaterally. No wheezing, rales, rhonchi or crackles. No use of accessory muscles of respiration.  CARDIOVASCULAR: S1, S2 normal. No murmurs, rubs, or gallops. Cap refill <2 seconds. Pulses intact distally.  ABDOMEN: Soft, nondistended, nontender. No rebound,  guarding, rigidity. Normoactive bowel sounds present in all four quadrants.  EXTREMITIES: No pedal edema, cyanosis, or clubbing. No calf tenderness or Homan's sign.  NEUROLOGIC: The patient is alert and oriented x 3. Cranial nerves II through XII are grossly intact with no focal sensorimotor deficit. PSYCHIATRIC:  Normal affect, mood, thought content. SKIN: Warm, dry, and intact without obvious rash, lesion, or ulcer.    Labs on Admission:  CBC: Recent Labs  Lab 12/04/22 1658 12/04/22 2013  WBC 7.4 7.9  NEUTROABS 2.7 3.0  HGB 7.8* 7.3*  HCT 24.8* 23.6*  MCV 99.6 100.4*  PLT 269 260   Basic Metabolic Panel: Recent Labs  Lab 12/04/22 1658  NA 134*  K 4.0  CL 102  CO2 22  GLUCOSE 136*  BUN 28*  CREATININE 1.81*  CALCIUM 8.5*   GFR: CrCl cannot be calculated (Unknown ideal weight.). Liver Function Tests: Recent Labs  Lab 12/04/22 1658  AST 21  ALT 13  ALKPHOS 55  BILITOT 0.9  PROT 7.7  ALBUMIN 3.0*   No results for input(s): "LIPASE", "AMYLASE" in the last 168 hours. No results for input(s): "AMMONIA" in the last 168 hours. Coagulation Profile: Recent Labs  Lab 12/04/22 1658  INR 1.0   Cardiac Enzymes: No results for input(s): "CKTOTAL", "CKMB", "CKMBINDEX", "TROPONINI" in the last 168 hours. BNP (last 3 results) No results for input(s): "PROBNP" in the last 8760 hours. HbA1C: No results for input(s): "HGBA1C" in the last 72 hours. CBG: No results for input(s): "GLUCAP" in the last 168 hours. Lipid Profile: No results for input(s): "CHOL", "HDL", "LDLCALC", "TRIG", "CHOLHDL", "LDLDIRECT" in the last 72 hours. Thyroid Function Tests: No results for input(s): "TSH", "T4TOTAL", "FREET4", "T3FREE", "THYROIDAB" in the last 72 hours. Anemia Panel: No results for input(s): "VITAMINB12", "FOLATE", "FERRITIN", "TIBC", "IRON", "RETICCTPCT" in the last 72 hours. Urine analysis:    Component Value Date/Time   COLORURINE YELLOW 11/06/2022 1833   APPEARANCEUR  TURBID (A) 11/06/2022 1833   LABSPEC 1.013 11/06/2022 1833   PHURINE 7.0 11/06/2022 1833   GLUCOSEU NEGATIVE 11/06/2022 1833   HGBUR SMALL (A) 11/06/2022 1833   BILIRUBINUR NEGATIVE 11/06/2022 1833   BILIRUBINUR negative 09/27/2015 1629   BILIRUBINUR neg 07/01/2012 0902   KETONESUR NEGATIVE 11/06/2022 1833   PROTEINUR >=300 (A) 11/06/2022 1833   UROBILINOGEN 4.0 09/27/2015 1629   NITRITE NEGATIVE 11/06/2022 1833   LEUKOCYTESUR MODERATE (A) 11/06/2022 1833   Sepsis Labs: @LABRCNTIP (procalcitonin:4,lacticidven:4) )No results found for this or any previous visit (from the past 240 hour(s)).   Radiological Exams on Admission: No results found.    Assessment/Plan  This is a 88 y.o. male with a history of arthritis, CKD, COPD diabetes with chronic wounds, hypertension, hyperlipidemia, iron deficiency, CVA now being admitted with:  #. Acute on chronic anemia 2/2 GI Bleed - Admit IP tele -IV Protonix 40mg  BID -Transfuse 2 units due to downtrending HgB -Continue iron -Nothing by mouth -IV fluid hydration -Hold anticoagulants -GI consultation has been requested by EDP  #.  History of hyperlipidemia - Continue Lipitor  #.  History of hypertension - Continue isosorbide  #. History of CKD, near baseline - Continue monitor BMP  - Continue Depakote and gabapentin  #. History of COPD - Continue Breo Ellipta, Combivent, Claritin  #. History of diabetes - Continue Lantus and sliding scale hold repaglinide  Admission status: Inpatient  IV Fluids: Hep-Lock Diet/Nutrition: N.p.o. Consults called: GI DVT Px: SCDs and early ambulation. Code Status: Full Code  Disposition Plan: To home in 1-2 days  All the records are reviewed and case discussed with ED provider. Management plans discussed with the patient and/or family who express understanding and agree with plan of care.  [employer] D.O. on 12/04/2022 at 10:46 PM CC: Primary care physician; [facility], [facility]   12/04/2022, 10:46 PM

## 2022-12-04 NOTE — ED Notes (Addendum)
ED TO INPATIENT HANDOFF REPORT  ED Nurse Name and Phone #: Juliette Alcide RN [phone]  S Name/Age/Gender Justin [name] 88 y.o. male Room/Bed: 041C/041C  Code Status   Code Status: Full Code  Home/SNF/Other Nursing Home Patient oriented to: self, place, time, and situation Is this baseline? Yes   Triage Complete: Triage complete  Chief Complaint GI bleed [K92.2]  Triage Note Pt BIb [facility] from [facility] place for a lab value of Hgb 6.4.  Pt does not endorse weakness or SOB. Pt is A&0x1 per EMS.  Pt answered all orientation questions for me  140/70 97% 70 97.5 CBG 197   Allergies No Known Allergies  Level of Care/Admitting Diagnosis ED Disposition     ED Disposition  Admit   Condition  --   Comment  H[facility]: [facility] [[id number]]  Level of Care: Telemetry Medical [104]  May admit patient to [facility] or [facility] if equivalent level of care is available:: No  Covid Evaluation: Asymptomatic - no recent exposure (last 10 days) testing not required  Diagnosis: GI bleed [454098]  Admitting Physician: Tonye Royalty [[id number]]  Attending Physician: Tonye Royalty [[id number]]  Certification:: I certify this patient will need inpatient services for at least 2 midnights  Expected Medical Readiness: 12/06/2022          B Medical/Surgery History Past Medical History:  Diagnosis Date   Acute metabolic encephalopathy 05/24/2021   Arthritis    Diabetes mellitus    HTN (hypertension)    Hypercholesteremia    Iron deficiency    Pancreatitis, acute 06/02/2012   Diagnosed 05/2012    Personal history of colonic adenomas 04/15/2013   04/15/2013 2 diminutive polyps     Stroke [facility]    TIA (transient ischemic attack)    Past Surgical History:  Procedure Laterality Date   ABDOMINAL AORTOGRAM W/LOWER EXTREMITY Right 11/24/2021   Procedure: ABDOMINAL AORTOGRAM W/LOWER EXTREMITY;  Surgeon: Leonie Douglas, MD;  [facility];  Service:  Cardiovascular;  Laterality: Right;   AMPUTATION TOE Right 11/26/2021   Procedure: AMPUTATION TOE;  Surgeon: Vivi Barrack, DPM;  Location: [facility];  Service: Podiatry;  Laterality: Right;   ARTERY BIOPSY  02/26/2011   Procedure: BIOPSY TEMPORAL ARTERY;  Surgeon: Iona Coach, MD;  Location: [facility];  Service: General;  Laterality: Left;  Left temporal artery biospy   BACK SURGERY  1985   CHOLECYSTECTOMY     COLONOSCOPY     EUS     NECK SURGERY  1985   PERIPHERAL VASCULAR BALLOON ANGIOPLASTY Right 11/24/2021   Procedure: PERIPHERAL VASCULAR BALLOON ANGIOPLASTY;  Surgeon: Leonie Douglas, MD;  [facility];  Service: Cardiovascular;  Laterality: Right;  POPLITEAL AND PERONEAL   TEMPORAL ARTERY BIOPSY / LIGATION  02/26/2011   left side   TRANSMETATARSAL AMPUTATION Right 01/28/2022   Procedure: TRANSMETATARSAL AMPUTATION; ACHILLES TENDON LENGTHENING;  Surgeon: Candelaria Stagers, DPM;  Location[facility];  Service: Podiatry;  Laterality: Right;  R foot Transmetatarsal amputation, possible tendo achilles lengthening   UPPER GASTROINTESTINAL ENDOSCOPY       A IV Location/Drains/Wounds Patient Lines/Drains/Airways Status     Active Line/Drains/Airways     Name Placement date Placement time Site Days   Peripheral IV 12/04/22 20 G Distal;Left;Posterior Forearm 12/04/22  1659  Forearm  less than 1   Pressure Injury 11/24/21 Heel Right Stage 2 -  Partial thickness loss of dermis presenting as a shallow open injury  with a red, pink wound bed without slough. 11/24/21  2200  -- 375   Wound / Incision (Open or Dehisced) 11/21/21 Other (Comment) Toe (Comment  which one) Anterior;Right right 2nd toe 11/21/21  0645  Toe (Comment  which one)  378   Wound / Incision (Open or Dehisced) 11/21/21 Other (Comment) Heel Left;Posterior 11/21/21  0645  Heel  378            Intake/Output Last 24 hours No intake or output data in the 24 hours ending 12/04/22  2305  Labs/Imaging Results for orders placed or performed during the hospital encounter of 12/04/22 (from the past 48 hour(s))  Comprehensive metabolic panel     Status: Abnormal   Collection Time: 12/04/22  4:58 PM  Result Value Ref Range   Sodium 134 (L) 135 - 145 mmol/L   Potassium 4.0 3.5 - 5.1 mmol/L   Chloride 102 98 - 111 mmol/L   CO2 22 22 - 32 mmol/L   Glucose, Bld 136 (H) 70 - 99 mg/dL    Comment: Glucose reference range applies only to samples taken after fasting for at least 8 hours.   BUN 28 (H) 8 - 23 mg/dL   Creatinine, Ser 1.61 (H) 0.61 - 1.24 mg/dL   Calcium 8.5 (L) 8.9 - 10.3 mg/dL   Total Protein 7.7 6.5 - 8.1 g/dL   Albumin 3.0 (L) 3.5 - 5.0 g/dL   AST 21 15 - 41 U/L   ALT 13 0 - 44 U/L   Alkaline Phosphatase 55 38 - 126 U/L   Total Bilirubin 0.9 0.3 - 1.2 mg/dL   GFR, Estimated 36 (L) >60 mL/min    Comment: (NOTE) Calculated using the CKD-EPI Creatinine Equation (2021)    Anion gap 10 5 - 15    Comment: Performed at [facility], [address], [place], [place] [address]  CBC with Differential     Status: Abnormal   Collection Time: 12/04/22  4:58 PM  Result Value Ref Range   WBC 7.4 4.0 - 10.5 K/uL   RBC 2.49 (L) 4.22 - 5.81 MIL/uL   Hemoglobin 7.8 (L) 13.0 - 17.0 g/dL   HCT 54.0 (L) 98.1 - 19.1 %   MCV 99.6 80.0 - 100.0 fL   MCH 31.3 26.0 - 34.0 pg   MCHC 31.5 30.0 - 36.0 g/dL   RDW 47.8 29.5 - 62.1 %   Platelets 269 150 - 400 K/uL   nRBC 0.0 0.0 - 0.2 %   Neutrophils Relative % 37 %   Neutro Abs 2.7 1.7 - 7.7 K/uL   Lymphocytes Relative 40 %   Lymphs Abs 3.0 0.7 - 4.0 K/uL   Monocytes Relative 10 %   Monocytes Absolute 0.7 0.1 - 1.0 K/uL   Eosinophils Relative 12 %   Eosinophils Absolute 0.9 (H) 0.0 - 0.5 K/uL   Basophils Relative 1 %   Basophils Absolute 0.1 0.0 - 0.1 K/uL   Immature Granulocytes 0 %   Abs Immature Granulocytes 0.03 0.00 - 0.07 K/uL    Comment: Performed at [facility], [address], [place], [place]  [address]  Protime-INR     Status: None   Collection Time: 12/04/22  4:58 PM  Result Value Ref Range   Prothrombin Time 13.7 11.4 - 15.2 seconds   INR 1.0 0.8 - 1.2    Comment: (NOTE) INR goal varies based on device and disease states. Performed at [facility], [address] [place], [place] [address]   Type and screen [facility]     Status: None   Collection Time: 12/04/22  5:29 PM  Result Value Ref Range   ABO/RH(D) A POS    Antibody Screen NEG    Sample Expiration      12/07/2022,2359 Performed at [facility], [address], [place], [place] [address]   POC occult blood, ED     Status: Abnormal   Collection Time: 12/04/22  7:00 PM  Result Value Ref Range   Fecal Occult Bld POSITIVE (A) NEGATIVE  CBC with Differential     Status: Abnormal   Collection Time: 12/04/22  8:13 PM  Result Value Ref Range   WBC 7.9 4.0 - 10.5 K/uL   RBC 2.35 (L) 4.22 - 5.81 MIL/uL   Hemoglobin 7.3 (L) 13.0 - 17.0 g/dL   HCT 11.9 (L) 14.7 - 82.9 %   MCV 100.4 (H) 80.0 - 100.0 fL   MCH 31.1 26.0 - 34.0 pg   MCHC 30.9 30.0 - 36.0 g/dL   RDW 56.2 13.0 - 86.5 %   Platelets 260 150 - 400 K/uL   nRBC 0.0 0.0 - 0.2 %   Neutrophils Relative % 38 %   Neutro Abs 3.0 1.7 - 7.7 K/uL   Lymphocytes Relative 40 %   Lymphs Abs 3.1 0.7 - 4.0 K/uL   Monocytes Relative 10 %   Monocytes Absolute 0.8 0.1 - 1.0 K/uL   Eosinophils Relative 11 %   Eosinophils Absolute 0.8 (H) 0.0 - 0.5 K/uL   Basophils Relative 1 %   Basophils Absolute 0.1 0.0 - 0.1 K/uL   Immature Granulocytes 0 %   Abs Immature Granulocytes 0.03 0.00 - 0.07 K/uL    Comment: Performed at [facility], [address] [place], [place] [address]   No results found.  Pending Labs [employer] (From admission, onward)     Start     Ordered   12/04/22 1800  ABO/Rh  Once,   R        12/04/22 1800   Signed and Held  CBC  Tomorrow morning,   R        Signed and Held   Signed and Held  Iron and TIBC   Once,   R        Signed and Held   Signed and Held  Ferritin  Once,   R        Signed and Held   Signed and Held  Vitamin B12  Once,   R        Signed and Held   Signed and Held  Folate  Once,   R        Signed and Held   Signed and Armed forces training and education officer morning,   R        Signed and Held   Signed and Held  CBC  Every 6 hours (unscheduled),   R      Signed and Held   Medical illustrator and Held  Type and screen [facility] [employer] [facility]  Once,   R       Comments: [facility]    Signed and Held            Vitals/Pain Today's Vitals   12/04/22 1659 12/04/22 1930 12/04/22 2030 12/04/22 2230  BP:  139/67 (!) 154/67 133/66  Pulse:  74 75 77  Resp:  20 19  18  Temp:    99 F (37.2 C)  TempSrc:    Oral  SpO2:  100% 100% 100%  PainSc: 0-No pain       Isolation Precautions No active isolations  Medications Medications - No data to display  Mobility non-ambulatory     Focused Assessments Cardiac Assessment Handoff:    Lab Results  Component Value Date   TROPONINI <0.03 08/31/2018   No results found for: "DDIMER" Does the Patient currently have chest pain? No     R Recommendations: See Admitting Provider Note  Report given to:   Additional Notes: Pt A&Ox4. Pt continent. Pt has pink MOST form from facility.

## 2022-12-05 ENCOUNTER — Inpatient Hospital Stay (HOSPITAL_COMMUNITY): Payer: Medicare Other

## 2022-12-05 DIAGNOSIS — R195 Other fecal abnormalities: Secondary | ICD-10-CM

## 2022-12-05 DIAGNOSIS — D649 Anemia, unspecified: Secondary | ICD-10-CM

## 2022-12-05 DIAGNOSIS — M79662 Pain in left lower leg: Secondary | ICD-10-CM

## 2022-12-05 DIAGNOSIS — K922 Gastrointestinal hemorrhage, unspecified: Secondary | ICD-10-CM | POA: Diagnosis not present

## 2022-12-05 DIAGNOSIS — Z7902 Long term (current) use of antithrombotics/antiplatelets: Secondary | ICD-10-CM

## 2022-12-05 LAB — CBC
HCT: 20.6 % — ABNORMAL LOW (ref 39.0–52.0)
HCT: 26.6 % — ABNORMAL LOW (ref 39.0–52.0)
HCT: 29.4 % — ABNORMAL LOW (ref 39.0–52.0)
Hemoglobin: 10 g/dL — ABNORMAL LOW (ref 13.0–17.0)
Hemoglobin: 6.6 g/dL — CL (ref 13.0–17.0)
Hemoglobin: 8.8 g/dL — ABNORMAL LOW (ref 13.0–17.0)
MCH: 29.8 pg (ref 26.0–34.0)
MCH: 30.6 pg (ref 26.0–34.0)
MCH: 31.1 pg (ref 26.0–34.0)
MCHC: 32 g/dL (ref 30.0–36.0)
MCHC: 33.1 g/dL (ref 30.0–36.0)
MCHC: 34 g/dL (ref 30.0–36.0)
MCV: 90.2 fL (ref 80.0–100.0)
MCV: 91.3 fL (ref 80.0–100.0)
MCV: 95.4 fL (ref 80.0–100.0)
Platelets: 211 10*3/uL (ref 150–400)
Platelets: 235 10*3/uL (ref 150–400)
Platelets: 261 10*3/uL (ref 150–400)
RBC: 2.16 MIL/uL — ABNORMAL LOW (ref 4.22–5.81)
RBC: 2.95 MIL/uL — ABNORMAL LOW (ref 4.22–5.81)
RBC: 3.22 MIL/uL — ABNORMAL LOW (ref 4.22–5.81)
RDW: 14.8 % (ref 11.5–15.5)
RDW: 16.5 % — ABNORMAL HIGH (ref 11.5–15.5)
RDW: 16.5 % — ABNORMAL HIGH (ref 11.5–15.5)
WBC: 7.2 10*3/uL (ref 4.0–10.5)
WBC: 8 10*3/uL (ref 4.0–10.5)
WBC: 9.1 10*3/uL (ref 4.0–10.5)
nRBC: 0 % (ref 0.0–0.2)
nRBC: 0 % (ref 0.0–0.2)
nRBC: 0 % (ref 0.0–0.2)

## 2022-12-05 LAB — FERRITIN: Ferritin: 31 ng/mL (ref 24–336)

## 2022-12-05 LAB — ABO/RH: ABO/RH(D): A POS

## 2022-12-05 LAB — BASIC METABOLIC PANEL
Anion gap: 5 (ref 5–15)
BUN: 21 mg/dL (ref 8–23)
CO2: 21 mmol/L — ABNORMAL LOW (ref 22–32)
Calcium: 7.9 mg/dL — ABNORMAL LOW (ref 8.9–10.3)
Chloride: 106 mmol/L (ref 98–111)
Creatinine, Ser: 1.39 mg/dL — ABNORMAL HIGH (ref 0.61–1.24)
GFR, Estimated: 49 mL/min — ABNORMAL LOW (ref >60)
Glucose, Bld: 95 mg/dL (ref 70–99)
Potassium: 3.8 mmol/L (ref 3.5–5.1)
Sodium: 132 mmol/L — ABNORMAL LOW (ref 135–145)

## 2022-12-05 LAB — GLUCOSE, CAPILLARY
Glucose-Capillary: 47 mg/dL — ABNORMAL LOW (ref 70–99)
Glucose-Capillary: 106 mg/dL — ABNORMAL HIGH (ref 70–99)
Glucose-Capillary: 65 mg/dL — ABNORMAL LOW (ref 70–99)
Glucose-Capillary: 132 mg/dL — ABNORMAL HIGH (ref 70–99)
Glucose-Capillary: 69 mg/dL — ABNORMAL LOW (ref 70–99)
Glucose-Capillary: 115 mg/dL — ABNORMAL HIGH (ref 70–99)
Glucose-Capillary: 180 mg/dL — ABNORMAL HIGH (ref 70–99)

## 2022-12-05 LAB — VITAMIN B12: Vitamin B-12: 436 pg/mL (ref 180–914)

## 2022-12-05 LAB — IRON AND TIBC
Iron: 50 ug/dL (ref 45–182)
Saturation Ratios: 16 % — ABNORMAL LOW (ref 17.9–39.5)
TIBC: 311 ug/dL (ref 250–450)
UIBC: 261 ug/dL

## 2022-12-05 LAB — PREPARE RBC (CROSSMATCH)

## 2022-12-05 LAB — FOLATE: Folate: 11.8 ng/mL (ref >5.9)

## 2022-12-05 MED ORDER — IPRATROPIUM-ALBUTEROL 0.5-2.5 (3) MG/3ML IN SOLN: 3.0000 mL | Freq: Four times a day (QID) | RESPIRATORY_TRACT | Status: DC | PRN

## 2022-12-05 MED ORDER — DEXTROSE 50 % IV SOLN
INTRAVENOUS | Status: AC
  Administered 2022-12-05: 12.5 g via INTRAVENOUS
  Filled 2022-12-05: qty 50

## 2022-12-05 MED ORDER — LORATADINE 10 MG PO TABS
10.0000 mg | ORAL_TABLET | Freq: Every day | ORAL | Status: DC
  Administered 2022-12-05 – 2022-12-08 (×4): 10 mg via ORAL
  Filled 2022-12-05 (×4): qty 1

## 2022-12-05 MED ORDER — ACETAMINOPHEN 325 MG PO TABS
650.0000 mg | ORAL_TABLET | Freq: Four times a day (QID) | ORAL | Status: DC | PRN
  Administered 2022-12-05: 650 mg via ORAL
  Filled 2022-12-05: qty 2

## 2022-12-05 MED ORDER — INSULIN ASPART 100 UNIT/ML FLEXPEN: 0.0000 [IU] | PEN_INJECTOR | SUBCUTANEOUS | Status: DC

## 2022-12-05 MED ORDER — INSULIN GLARGINE-YFGN 100 UNIT/ML ~~LOC~~ SOLN
14.0000 [IU] | Freq: Every day | SUBCUTANEOUS | Status: DC
  Administered 2022-12-06 – 2022-12-08 (×2): 14 [IU] via SUBCUTANEOUS
  Filled 2022-12-05 (×3): qty 0.14

## 2022-12-05 MED ORDER — INSULIN ASPART 100 UNIT/ML IJ SOLN
0.0000 [IU] | Freq: Three times a day (TID) | INTRAMUSCULAR | Status: DC
  Administered 2022-12-06: 5 [IU] via SUBCUTANEOUS
  Administered 2022-12-07: 1 [IU] via SUBCUTANEOUS
  Administered 2022-12-08: 2 [IU] via SUBCUTANEOUS

## 2022-12-05 MED ORDER — ONDANSETRON HCL 4 MG PO TABS: 4.0000 mg | ORAL_TABLET | Freq: Four times a day (QID) | ORAL | Status: DC | PRN

## 2022-12-05 MED ORDER — HYDROCODONE-ACETAMINOPHEN 5-325 MG PO TABS: 1.0000 | ORAL_TABLET | ORAL | Status: DC | PRN

## 2022-12-05 MED ORDER — ATORVASTATIN CALCIUM 40 MG PO TABS
40.0000 mg | ORAL_TABLET | Freq: Every day | ORAL | Status: DC
  Administered 2022-12-05 – 2022-12-07 (×4): 40 mg via ORAL
  Filled 2022-12-05 (×4): qty 1

## 2022-12-05 MED ORDER — ISOSORBIDE MONONITRATE 20 MG PO TABS
10.0000 mg | ORAL_TABLET | Freq: Every day | ORAL | Status: DC
  Administered 2022-12-05 – 2022-12-07 (×4): 10 mg via ORAL
  Filled 2022-12-05 (×5): qty 1

## 2022-12-05 MED ORDER — ACETAMINOPHEN 650 MG RE SUPP: 650.0000 mg | Freq: Four times a day (QID) | RECTAL | Status: DC | PRN

## 2022-12-05 MED ORDER — HYDROXYZINE HCL 10 MG PO TABS
10.0000 mg | ORAL_TABLET | Freq: Two times a day (BID) | ORAL | Status: DC
  Administered 2022-12-05 – 2022-12-08 (×8): 10 mg via ORAL
  Filled 2022-12-05 (×8): qty 1

## 2022-12-05 MED ORDER — MORPHINE SULFATE (PF) 2 MG/ML IV SOLN: 1.0000 mg | Freq: Four times a day (QID) | INTRAVENOUS | Status: DC | PRN

## 2022-12-05 MED ORDER — FLUTICASONE FUROATE-VILANTEROL 100-25 MCG/ACT IN AEPB
1.0000 | INHALATION_SPRAY | Freq: Every day | RESPIRATORY_TRACT | Status: DC
  Administered 2022-12-05 – 2022-12-08 (×3): 1 via RESPIRATORY_TRACT
  Filled 2022-12-05: qty 28

## 2022-12-05 MED ORDER — PANCRELIPASE (LIP-PROT-AMYL) 12000-38000 UNITS PO CPEP
24000.0000 [IU] | ORAL_CAPSULE | Freq: Three times a day (TID) | ORAL | Status: DC
  Administered 2022-12-06 – 2022-12-08 (×7): 24000 [IU] via ORAL
  Filled 2022-12-05 (×8): qty 2

## 2022-12-05 MED ORDER — DEXTROSE 50 % IV SOLN: 25.0000 mL | Freq: Once | INTRAVENOUS | Status: AC

## 2022-12-05 MED ORDER — ONDANSETRON HCL 4 MG/2ML IJ SOLN: 4.0000 mg | Freq: Four times a day (QID) | INTRAMUSCULAR | Status: DC | PRN

## 2022-12-05 MED ORDER — SODIUM CHLORIDE 0.9 % IV SOLN
INTRAVENOUS | Status: DC
  Administered 2022-12-07: 75 mL/h via INTRAVENOUS

## 2022-12-05 MED ORDER — HYDRALAZINE HCL 20 MG/ML IJ SOLN: 5.0000 mg | Freq: Three times a day (TID) | INTRAMUSCULAR | Status: DC | PRN

## 2022-12-05 MED ORDER — IOHEXOL 350 MG/ML SOLN
75.0000 mL | Freq: Once | INTRAVENOUS | Status: AC | PRN
  Administered 2022-12-05: 75 mL via INTRAVENOUS

## 2022-12-05 MED ORDER — PANTOPRAZOLE SODIUM 40 MG IV SOLR
40.0000 mg | Freq: Two times a day (BID) | INTRAVENOUS | Status: DC
  Administered 2022-12-05 – 2022-12-08 (×8): 40 mg via INTRAVENOUS
  Filled 2022-12-05 (×8): qty 10

## 2022-12-05 MED ORDER — IPRATROPIUM BROMIDE 0.02 % IN SOLN: 0.5000 mg | Freq: Four times a day (QID) | RESPIRATORY_TRACT | Status: DC | PRN

## 2022-12-05 MED ORDER — INSULIN GLARGINE-YFGN 100 UNIT/ML ~~LOC~~ SOLN
19.0000 [IU] | Freq: Every day | SUBCUTANEOUS | Status: DC
  Filled 2022-12-05: qty 0.19

## 2022-12-05 MED ORDER — SODIUM CHLORIDE 0.9% FLUSH
3.0000 mL | Freq: Two times a day (BID) | INTRAVENOUS | Status: DC
  Administered 2022-12-05 – 2022-12-07 (×7): 3 mL via INTRAVENOUS

## 2022-12-05 MED ORDER — DEXTROSE 50 % IV SOLN: 12.5000 g | INTRAVENOUS | Status: AC

## 2022-12-05 MED ORDER — DIVALPROEX SODIUM 125 MG PO CSDR
125.0000 mg | DELAYED_RELEASE_CAPSULE | Freq: Two times a day (BID) | ORAL | Status: DC
  Administered 2022-12-05 – 2022-12-08 (×8): 125 mg via ORAL
  Filled 2022-12-05 (×7): qty 1

## 2022-12-05 MED ORDER — FERROUS GLUCONATE 324 (38 FE) MG PO TABS
324.0000 mg | ORAL_TABLET | Freq: Every day | ORAL | Status: DC
  Administered 2022-12-05 – 2022-12-08 (×3): 324 mg via ORAL
  Filled 2022-12-05 (×4): qty 1

## 2022-12-05 MED ORDER — DEXTROSE 50 % IV SOLN
INTRAVENOUS | Status: AC
  Administered 2022-12-05: 25 mL via INTRAVENOUS
  Filled 2022-12-05: qty 50

## 2022-12-05 MED ORDER — SODIUM CHLORIDE 0.9% IV SOLUTION: Freq: Once | INTRAVENOUS | Status: AC

## 2022-12-05 MED ORDER — ALBUTEROL SULFATE (2.5 MG/3ML) 0.083% IN NEBU: 2.5000 mg | INHALATION_SOLUTION | Freq: Four times a day (QID) | RESPIRATORY_TRACT | Status: DC | PRN

## 2022-12-05 MED ORDER — GABAPENTIN 100 MG PO CAPS
100.0000 mg | ORAL_CAPSULE | Freq: Two times a day (BID) | ORAL | Status: DC
  Administered 2022-12-05 – 2022-12-08 (×8): 100 mg via ORAL
  Filled 2022-12-05 (×8): qty 1

## 2022-12-05 MED ORDER — TRAZODONE HCL 50 MG PO TABS
25.0000 mg | ORAL_TABLET | Freq: Every evening | ORAL | Status: DC | PRN
  Administered 2022-12-05: 25 mg via ORAL
  Filled 2022-12-05: qty 1

## 2022-12-05 NOTE — Progress Notes (Signed)
PROGRESS NOTE  KONYE HUNDLEY MVH:[id number] DOB: 02-01-1935 DOA: 12/04/2022 PCP: [facility]   LOS: 1 day   Brief Narrative / Interim history: 88 year old male with chronic wounds, HTN, HLD, peripheral vascular disease, CKD comes into the hospital with anemia.  Routine labs were drawn in his living facility and was found to have a hemoglobin of 6.4.  Fecal occult was positive  Subjective / 24h Interval events: Complains of left thigh pain this morning.  No abdominal pain, no nausea or vomiting  Assesement and Plan: Principal problem Anemia, presumed due to blood loss -possible that he has a slow GI bleed.  Hemoglobin of 6.4 but he was asymptomatic.  Fecal occult was positive.  Gastroenterology consulted, appreciate input. -Continue PPI.  Hold home Plavix  Active problems CKD 3B -baseline creatinine 1.6-2, currently at baseline  Hyperlipidemia-continue Lipitor  Hypertension-continue Imdur  History of COPD-continue inhalers, respiratory status stable and at baseline  PAD, left toe ulcers -left first and third toes, superficial.  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  Decrease glargine due to hypoglycemic episode this morning while n.p.o.  Lab Results  Component Value Date   HGBA1C 6.2 (H) 11/07/2022   CBG (last 3)  Recent Labs    12/05/22 0827 12/05/22 0927  GLUCAP 47* 106*    Scheduled Meds:  atorvastatin  40 mg Oral QHS   divalproex  125 mg Oral BID   ferrous gluconate  324 mg Oral Q breakfast   fluticasone furoate-vilanterol  1 puff Inhalation Daily   gabapentin  100 mg Oral BID   hydrOXYzine  10 mg Oral BID   insulin aspart  0-9 Units Subcutaneous TID WC    insulin glargine-yfgn  19 Units Subcutaneous Daily   isosorbide mononitrate  10 mg Oral QHS   lipase/protease/amylase  24,000 Units Oral TID WC   loratadine  10 mg Oral Daily   pantoprazole (PROTONIX) IV  40 mg Intravenous Q12H   sodium chloride flush  3 mL Intravenous Q12H   Continuous Infusions:  sodium chloride 75 mL/hr at 12/05/22 0029   PRN Meds:.acetaminophen **OR** acetaminophen, albuterol, hydrALAZINE, HYDROcodone-acetaminophen, ipratropium, ipratropium-albuterol, morphine injection, ondansetron **OR** ondansetron (ZOFRAN) IV, traZODone  Current Outpatient Medications  Medication Instructions   acetaminophen (TYLENOL) 650 mg, Oral, Every morning   Alcohol Swabs PADS Use to check blood sugar 3 times daily. Dx: E10.9   aspirin 81 mg, Oral, Daily   atorvastatin (LIPITOR) 40 mg, Oral, Daily at bedtime   Blood Glucose Calibration (ACCU-CHEK AVIVA) SOLN Use to check blood sugar 3 times daily. Dx: E10.9   Blood Glucose Monitoring Suppl (ACCU-CHEK AVIVA PLUS) w/Device KIT Use to check blood sugar 3 times daily. Dx: E10.9   BREO ELLIPTA 100-25 MCG/ACT AEPB 1 puff, Inhalation, Daily   cefdinir (OMNICEF) 300 mg, Oral, Every evening   clopidogrel (PLAVIX) 75 mg, Oral, Daily with breakfast   [employer] SALINE NOSE SPRAY NA 2 sprays, Nasal, 2 times daily   divalproex (DEPAKOTE SPRINKLE) 125 mg, Oral, 2 times daily   docusate sodium (COLACE) 100 mg, Oral, 2 times daily   Dulaglutide 1.5 mg, Subcutaneous, Weekly, tuesday   ferrous gluconate (FERGON) 324 mg, Oral, Daily   gabapentin (NEURONTIN) 100 mg, Oral, 2 times daily   glucagon 1 mg, Intramuscular, Once PRN  glucose blood (ACCU-CHEK AVIVA PLUS) test strip Use to check blood sugar 3 times daily. Dx: E10.9   hydrOXYzine (ATARAX) 10 mg, Oral, 2 times daily, hold for sedation   Insulin Pen Needle 32G X 4 MM MISC Use as directed to inject insulin tidwc   Ipratropium-Albuterol (COMBIVENT) 20-100 MCG/ACT AERS respimat 1 puff, Inhalation, Every 6  hours PRN   isosorbide mononitrate (ISMO) 10 mg, Oral, Daily at bedtime   Lancets (ACCU-CHEK SOFT TOUCH) lancets Use to check blood sugar 3 times daily. Dx: E10.9   loratadine (CLARITIN) 10 mg, Oral, Daily   NovoLOG FlexPen 0-8 Units, Subcutaneous, See admin instructions, Inject 0-8 units into the skin before lunch and supper, per sliding scale: if is BS 70-200 give 0 units, if BS is 201-250 give 2 units, if BS is 251-300 give 4 units, if BS is 301-350 give 6 units, if BS is 351-400 give 8 units. If blood sugar is greater than 400, call MD.    oxycodone (OXY-IR) 5 mg, Oral, Every 8 hours PRN   oxymetazoline (AFRIN) 0.05 % nasal spray 2 sprays, Each Nare, 2 times daily PRN   Pancrelipase, Lip-Prot-Amyl, (CREON) 24000-76000 units CPEP 1 capsule, Oral, 3 times daily, With 4-6 ounces of water   Pollen Extracts (PROSTAT PO) 30 mLs, Oral, 3 times daily   repaglinide (PRANDIN) 0.5 mg, Oral, 2 times daily before meals   Skin Protectants, Misc. (MINERIN CREME EX) 1 Application, Apply externally, Daily, Both legs and feet.   Spacer/Aero-Holding Rudean Curt Use as directed with combivent. Ok to dispense any spacer that works with the inhaler being rxed to pt.   Toujeo Max SoloStar 19 Units, Subcutaneous, Daily    Diet Orders (From admission, onward)     Start     Ordered   12/05/22 0012  Diet NPO time specified  Diet effective now        12/05/22 0011            DVT prophylaxis: SCDs Start: 12/05/22 0012   Lab Results  Component Value Date   PLT 261 12/04/2022      Code Status: Full Code  Family Communication: Daughter present at bedside  Status is: Inpatient Remains inpatient appropriate because: severity of illness  Level of care: Telemetry Medical  Consultants:  GI  Objective: Vitals:   12/05/22 0816 12/05/22 0845 12/05/22 0951 12/05/22 1006  BP: 131/64 130/66 132/62 130/66  Pulse: 68 70 70 70  Resp: 17 18 17 17   Temp: 98.3 F (36.8 C) 98 F (36.7 C) 98.2 F (36.8 C)  98 F (36.7 C)  TempSrc: Oral Oral Oral   SpO2: 99%  99%     Intake/Output Summary (Last 24 hours) at 12/05/2022 1128 Last data filed at 12/05/2022 0900 Gross per 24 hour  Intake 727.62 ml  Output --  Net 727.62 ml   Wt Readings from Last 3 Encounters:  11/08/22 63.6 kg  09/11/22 70.3 kg  03/13/22 70.3 kg    Examination:  Constitutional: NAD Eyes: no scleral icterus ENMT: Mucous membranes are moist.  Neck: normal, supple Respiratory: clear to auscultation bilaterally, no wheezing, no crackles. Normal respiratory effort. No accessory muscle use.  Cardiovascular: Regular rate and rhythm, no murmurs / rubs / gallops. No LE edema.  Abdomen: non distended, no tenderness. Bowel sounds positive.  Musculoskeletal: no clubbing / cyanosis.    Data Reviewed: I have independently reviewed following labs and imaging studies   CBC Recent Labs  Lab 12/04/22 1658 12/04/22 2013 12/04/22  2358  WBC 7.4 7.9 8.0  HGB 7.8* 7.3* 6.6*  HCT 24.8* 23.6* 20.6*  PLT 269 260 261  MCV 99.6 100.4* 95.4  MCH 31.3 31.1 30.6  MCHC 31.5 30.9 32.0  RDW 14.9 14.9 14.8  LYMPHSABS 3.0 3.1  --   MONOABS 0.7 0.8  --   EOSABS 0.9* 0.8*  --   BASOSABS 0.1 0.1  --     Recent Labs  Lab 12/04/22 1658  NA 134*  K 4.0  CL 102  CO2 22  GLUCOSE 136*  BUN 28*  CREATININE 1.81*  CALCIUM 8.5*  AST 21  ALT 13  ALKPHOS 55  BILITOT 0.9  ALBUMIN 3.0*  INR 1.0    ------------------------------------------------------------------------------------------------------------------ No results for input(s): "CHOL", "HDL", "LDLCALC", "TRIG", "CHOLHDL", "LDLDIRECT" in the last 72 hours.  Lab Results  Component Value Date   HGBA1C 6.2 (H) 11/07/2022   ------------------------------------------------------------------------------------------------------------------ No results for input(s): "TSH", "T4TOTAL", "T3FREE", "THYROIDAB" in the last 72 hours.  Invalid input(s): "FREET3"  Cardiac Enzymes No  results for input(s): "CKMB", "TROPONINI", "MYOGLOBIN" in the last 168 hours.  Invalid input(s): "CK" ------------------------------------------------------------------------------------------------------------------    Component Value Date/Time   BNP 55.7 05/18/2013 1528    CBG: Recent Labs  Lab 12/05/22 0827 12/05/22 0927  GLUCAP 47* 106*    No results found for this or any previous visit (from the past 240 hour(s)).   Radiology Studies: No results found.   [name], MD, PhD [facility]  Between 7 am - 7 pm I am available, please contact me via Amion (for emergencies) or Securechat (non urgent messages)  Between 7 pm - 7 am I am not available, please contact night coverage MD/APP via Amion

## 2022-12-05 NOTE — TOC Initial Note (Signed)
Transition of Care [facility]) - Initial/Assessment Note    Patient Details  Name: Justin [name] MRN: [id number] Date of Birth: 05-Sep-1934  [facility]) CM/SW Contact:    Carley Hammed, LCSW Phone Number: 12/05/2022, 12:22 PM  Clinical Narrative:                 CSW met with pt and son at bedside . Pt did not participate in assessment, son confirmed pt is from LTC at [facility] with no concerns for return when medically ready. Family agreeable to PTAR transport at DC. CSW confirmed with [facility], no barriers to DC when medically stable. TOC will continue to follow for DC needs.   Expected Discharge Plan: Skilled Nursing Facility Barriers to Discharge: Continued Medical Work up   Patient Goals and CMS Choice Patient states their goals for this hospitalization and ongoing recovery are:: Pt disoriented and unable to participate in goal setting.   Choice offered to / list presented to : Adult Children      Expected Discharge Plan and Services     Post Acute Care Choice: Skilled Nursing Facility Living arrangements for the past 2 months: Skilled Nursing Facility                                      Prior Living Arrangements/Services Living arrangements for the past 2 months: Skilled Nursing Facility Lives with:: Facility Resident Patient language and need for interpreter reviewed:: Yes Do you feel safe going back to the place where you live?: Yes      Need for Family Participation in Patient Care: Yes (Comment) Care giver support system in place?: Yes (comment)   Criminal Activity/Legal Involvement Pertinent to Current Situation/Hospitalization: No - Comment as needed  Activities of Daily Living Home Assistive Devices/Equipment: None ADL Screening (condition at time of admission) Patient's cognitive ability adequate to safely complete daily activities?: No Is the patient deaf or have difficulty hearing?: No Does the patient have difficulty seeing, even when  wearing glasses/contacts?: No Does the patient have difficulty concentrating, remembering, or making decisions?: Yes Patient able to express need for assistance with ADLs?: Yes Does the patient have difficulty dressing or bathing?: Yes Independently performs ADLs?: Yes (appropriate for developmental age) Does the patient have difficulty walking or climbing stairs?: Yes Weakness of Legs: Both Weakness of Arms/Hands: None  Permission Sought/Granted Permission sought to share information with : Facility Medical sales representative, Family Supports Permission granted to share information with : Yes, Verbal Permission Granted  Share Information with NAME: Royal Hawthorn  Permission granted to share info w AGENCY: [facility]  Permission granted to share info w Relationship: Son     Emotional Assessment Appearance:: Appears stated age Attitude/Demeanor/Rapport: Unable to Assess Affect (typically observed): Unable to Assess   Alcohol / Substance Use: Not Applicable Psych Involvement: No (comment)  Admission diagnosis:  GI bleed [K92.2] Occult GI bleeding [R19.5] Patient Active Problem List   Diagnosis Date Noted   GI bleed 12/04/2022   Toxic metabolic encephalopathy 11/07/2022   Acute metabolic encephalopathy 11/06/2022   UTI (urinary tract infection) 11/06/2022   Wound of lower extremity 11/06/2022   GERD without esophagitis 01/26/2022   Osteomyelitis of second toe of right foot ([name]) 01/25/2022   Acute-on-chronic kidney injury ([name])    Altered mental status    PAD (peripheral artery disease) ([name]) 12/09/2021   Balanitis 12/09/2021   Pressure injury of skin 11/25/2021  Diabetic foot ([name]) 11/16/2021   Diabetic foot infection ([name]) 11/15/2021   Intertrigo 11/15/2021   History of CVA (cerebrovascular accident) 05/21/2021   Chronic kidney disease, stage 3b ([name]) 05/21/2021   Mixed diabetic hyperlipidemia associated with type 2 diabetes mellitus ([name]) 05/21/2021   Polyneuropathy due to type 2  diabetes mellitus ([name]) 05/21/2021   Leukocytosis 05/21/2021   Type 2 diabetes mellitus with stage 3b chronic kidney disease, with long-term current use of insulin ([name]) 09/29/2015   Arthritis 09/06/2015   Diabetic peripheral neuropathy ([name]) 08/17/2015   OA (osteoarthritis) of knee 08/17/2015   Primary osteoarthritis of both knees 08/11/2015   Primary osteoarthritis of left hip 08/11/2015   Hammer toe, acquired 09/30/2013   Personal history of colonic adenomas 04/15/2013   Chronic pancreatitis ([name]) 02/26/2013   Anemia of chronic disease 02/26/2013   Goals of care, counseling/discussion 02/26/2013   Essential hypertension 12/14/2012   Testosterone deficiency 10/22/2011   Headache above the eye region 02/22/2011   Coronary artery disease 02/22/2011   PCP:  [facility]:   [facility] - [place], [place] - [address] [place] [address] [place] [address] [place] [address] Phone: [phone] Fax: [phone]     Social Determinants of Health (SDOH) Social History: SDOH Screenings   Food Insecurity: No Food Insecurity (12/05/2022)  Housing: Patient Unable To Answer (12/05/2022)  Transportation Needs: No Transportation Needs (12/05/2022)  Utilities: Not At Risk (12/05/2022)  Depression (PHQ2-9): Low Risk  (02/13/2022)  Tobacco Use: Medium Risk (11/06/2022)   SDOH Interventions:     Readmission Risk Interventions    01/26/2022   11:24 AM  Readmission Risk Prevention Plan  Transportation Screening Complete  PCP or Specialist Appt within 3-5 Days Complete  HRI or Home Care Consult Complete  Social Work Consult for Recovery Care Planning/Counseling Complete  Palliative Care Screening Not Applicable  Medication Review Oceanographer) Complete

## 2022-12-05 NOTE — Consult Note (Addendum)
Attending physician's note   I have taken a history, reviewed the chart, and examined the patient. I performed a substantive portion of this encounter, including complete performance of at least one of the key components, in conjunction with the APP. I agree with the APP's note, impression, and recommendations with my edits.  88 year old male with medical history as outlined below, to include history of CVA (on Plavix), diabetes, HTN, IDA, transferred from SNF for evaluation of acute on chronic anemia noted on routine labs.  No reported hematochezia or melena, although patient quite limited in ability to provide history.  Admission evaluation notable for the following: - H/H 6.6/20.6 with MCV/RDW 95/15 (baseline ~8-8.5) - Ferritin 31, iron 50, TIBC 311, sat 16%.  Normal folate - FOBT positive - INR 1.0  I discussed the finding of acute on chronic anemia with heme positive stool in the absence of overt bleeding with the patient and his son at bedside today.  Discussed role/utility of endoscopic evaluation.  He is reluctant to proceed with colonoscopy, mainly due to concerns that he would tolerate a bowel prep.  After good conversation, we elected for the following:  - 2 unit PRBC transfusion today with posttransfusion CBC check - CT A/P today - Continue trending CBCs with blood products as needed per protocol - Clears okay - N.p.o. at midnight for tentative EGD tomorrow - Continue holding Plavix - If CT and EGD both unrevealing, patient and son would again like to discuss role/utility of colonoscopy.  Given his chronic constipation this may require multiple days of clears and possible multiday prep anyway - Started on high-dose PPI on arrival   [place], [place], [place] [phone] office          Consultation  Referring Provider:  [facility]  Primary Care Physician:  [facility] Primary Gastroenterologist:  Dr. Leone Payor       Reason for Consultation: Acute on  chronic anemia     LOS: 1 day          HPI:   Justin [name] is a 88 y.o. male with past medical history significant for diabetes, hypertension, IDA, CVA (on Plavix), presents for evaluation of acute on chronic anemia.  Patient presents from SNF after having routine labs drawn and was found to have Hgb of 6.4.  Appears his baseline is around 8. Recently admitted 11/06/22 for AKI secondary to dehydration.  Pertinent lab values Hgb 6.6, MCV 95.4 Iron 50, TIBC 311, saturation 16% Ferritin 31 Folate 11.8 BUN 28, creatinine 1.81, GFR 36 FOBT positive  Patient's son and daughter are present and help with HPI.  They report since being put in SNF 4 years ago he has struggled with constipation and has had to be on laxatives.  Patient and family unsure when last bowel movement was.  Unsure if he is having bloody bowel movements.  Patient denies nausea, vomiting, pain.  Denies weakness/fatigue.  Of note his last colonoscopy was in 2015 which showed 2 diminutive sessile polyps and was otherwise normal.  Patient is on Plavix.  Unsure when his last doses, assuming yesterday a.m.   PREVIOUS GI WORKUP   Last colonoscopy 04/2013 for screening - 2 diminutive sessile polyps in ascending colon. - Otherwise normal  Past Medical History:  Diagnosis Date   Acute metabolic encephalopathy 05/24/2021   Arthritis    Diabetes mellitus    HTN (hypertension)    Hypercholesteremia    Iron deficiency    Pancreatitis, acute 06/02/2012  Diagnosed 05/2012    Personal history of colonic adenomas 04/15/2013   04/15/2013 2 diminutive polyps     Stroke [facility])    TIA (transient ischemic attack)     Surgical History:  He  has a past surgical history that includes Back surgery (1985); Neck surgery (1985); Temporal artery biopsy / ligation (02/26/2011); Artery Biopsy (02/26/2011); Cholecystectomy; Colonoscopy; Upper gastrointestinal endoscopy; EUS; Amputation toe (Right, 11/26/2021); ABDOMINAL AORTOGRAM W/LOWER EXTREMITY  (Right, 11/24/2021); PERIPHERAL VASCULAR BALLOON ANGIOPLASTY (Right, 11/24/2021); and Transmetatarsal amputation (Right, 01/28/2022). Family History:  His family history includes Amblyopia in his father; Diabetes in his sister; Heart attack in his sister; Heart disease in his mother. Social History:   reports that he quit smoking about 32 years ago. His smoking use included cigarettes. He started smoking about 67 years ago. He has never used smokeless tobacco. He reports that he does not drink alcohol and does not use drugs.  Prior to Admission medications   Medication Sig Start Date End Date Taking? Authorizing Provider  acetaminophen (TYLENOL) 325 MG tablet Take 650 mg by mouth in the morning.   Yes [provider]  aspirin 81 MG chewable tablet Chew 81 mg by mouth daily.   Yes [provider]  atorvastatin (LIPITOR) 40 MG tablet Take 40 mg by mouth at bedtime. 11/03/21  Yes [provider]  BREO ELLIPTA 100-25 MCG/ACT AEPB Inhale 1 puff into the lungs daily. 09/14/22  Yes [provider]  clopidogrel (PLAVIX) 75 MG tablet Take 1 tablet (75 mg total) by mouth daily with breakfast. 11/28/21  Yes Rhetta Mura, MD  [facility] SALINE NOSE SPRAY NA Place 2 sprays into the nose 2 (two) times daily.   Yes [provider]  divalproex (DEPAKOTE SPRINKLE) 125 MG capsule Take 125 mg by mouth 2 (two) times daily. 10/27/22  Yes [provider]  docusate sodium (COLACE) 100 MG capsule Take 100 mg by mouth 2 (two) times daily.   Yes [provider]  Dulaglutide 1.5 MG/0.5ML SOPN Inject 1.5 mg into the skin once a week. tuesday   Yes [provider]  ferrous gluconate (FERGON) 324 MG tablet Take 324 mg by mouth daily.   Yes [provider]  gabapentin (NEURONTIN) 100 MG capsule Take 100 mg by mouth 2 (two) times daily. 10/29/22  Yes [provider]  glucagon (GLUCAGON EMERGENCY) 1 MG injection Inject 1 mg into the muscle once as  needed. Patient taking differently: Inject 1 mg into the muscle once as needed (for low blood sugar). 09/29/15  Yes Carlus Pavlov, MD  hydrOXYzine (ATARAX) 10 MG tablet Take 10 mg by mouth 2 (two) times daily. hold for sedation   Yes [provider]  insulin glargine, 2 Unit Dial, (TOUJEO MAX SOLOSTAR) 300 UNIT/ML Solostar Pen Inject 19 Units into the skin daily.   Yes [provider]  Ipratropium-Albuterol (COMBIVENT) 20-100 MCG/ACT AERS respimat Inhale 1 puff into the lungs every 6 (six) hours as needed for wheezing. Patient taking differently: Inhale 1 puff into the lungs every 6 (six) hours as needed (for COPD). 05/25/21  Yes Tyrone Nine, MD  isosorbide mononitrate (ISMO) 10 MG tablet Take 10 mg by mouth at bedtime.   Yes [provider]  loratadine (CLARITIN) 10 MG tablet Take 10 mg by mouth daily.   Yes [provider]  NOVOLOG FLEXPEN 100 UNIT/ML FlexPen Inject 0-8 Units into the skin See admin instructions. Inject 0-8 units into the skin before lunch and supper, per sliding scale:  if is BS 70-200 give 0 units, if BS is 201-250 give 2 units, if BS is 251-300 give 4 units, if BS is 301-350 give 6 units, if BS is 351-400 give 8 units. If blood sugar is greater than 400, call MD. 10/18/21  Yes [provider]  oxycodone (OXY-IR) 5 MG capsule Take 5 mg by mouth every 8 (eight) hours as needed for pain.   Yes [provider]  oxymetazoline (AFRIN) 0.05 % nasal spray Place 2 sprays into both nostrils 2 (two) times daily as needed for congestion.   Yes [provider]  Pancrelipase, Lip-Prot-Amyl, (CREON) 24000-76000 units CPEP Take 1 capsule by mouth 3 (three) times daily. With 4-6 ounces of water   Yes [provider]  Pollen Extracts (PROSTAT PO) Take 30 mLs by mouth 3 (three) times daily.   Yes [provider]  repaglinide (PRANDIN) 0.5 MG tablet Take 0.5 mg by mouth 2 (two) times daily before a meal. 10/15/22  Yes  [provider]  Skin Protectants, Misc. (MINERIN CREME EX) Apply 1 Application topically daily. Both legs and feet.   Yes [provider]  Alcohol Swabs PADS Use to check blood sugar 3 times daily. Dx: E10.9 07/21/15   Trena Platt D, PA  Blood Glucose Calibration (ACCU-CHEK AVIVA) SOLN Use to check blood sugar 3 times daily. Dx: E10.9 07/21/15   Trena Platt D, PA  Blood Glucose Monitoring Suppl (ACCU-CHEK AVIVA PLUS) w/Device KIT Use to check blood sugar 3 times daily. Dx: E10.9 09/27/15   Ethelda Chick, MD  cefdinir (OMNICEF) 300 MG capsule Take 1 capsule (300 mg total) by mouth every evening. Patient not taking: Reported on 12/04/2022 11/09/22   Lonia Blood, MD  glucose blood (ACCU-CHEK AVIVA PLUS) test strip Use to check blood sugar 3 times daily. Dx: E10.9 11/17/15   Carlus Pavlov, MD  Insulin Pen Needle 32G X 4 MM MISC Use as directed to inject insulin tidwc 12/14/12   Sherren Mocha, MD  Lancets (ACCU-CHEK SOFT [facility]) lancets Use to check blood sugar 3 times daily. Dx: E10.9 07/21/15   Garnetta Buddy, PA  Spacer/Aero-Holding [facility] Use as directed with combivent. Ok to dispense any spacer that works with the inhaler being rxed to pt. 09/20/15   Sherren Mocha, MD    Current Facility-Administered Medications  Medication Dose Route Frequency Provider Last Rate Last Admin   0.9 %  sodium chloride infusion   Intravenous Continuous Hugelmeyer, Alexis, DO 75 mL/hr at 12/05/22 0029 New Bag at 12/05/22 0029   acetaminophen (TYLENOL) tablet 650 mg  650 mg Oral Q6H PRN Hugelmeyer, Alexis, DO       Or   acetaminophen (TYLENOL) suppository 650 mg  650 mg Rectal Q6H PRN Hugelmeyer, Alexis, DO       albuterol (PROVENTIL) (2.5 MG/3ML) 0.083% nebulizer solution 2.5 mg  2.5 mg Nebulization Q6H PRN Hugelmeyer, Alexis, DO       atorvastatin (LIPITOR) tablet 40 mg  40 mg Oral QHS Hugelmeyer, Alexis, DO   40 mg at 12/05/22 0052   divalproex (DEPAKOTE SPRINKLE) capsule  125 mg  125 mg Oral BID Hugelmeyer, Alexis, DO   125 mg at 12/05/22 0052   ferrous gluconate (FERGON) tablet 324 mg  324 mg Oral Q breakfast Hugelmeyer, Alexis, DO       fluticasone furoate-vilanterol (BREO ELLIPTA) 100-25 MCG/ACT 1 puff  1 puff Inhalation Daily Hugelmeyer, Alexis, DO   1 puff at 12/05/22 0732   gabapentin (  NEURONTIN) capsule 100 mg  100 mg Oral BID Hugelmeyer, Alexis, DO   100 mg at 12/05/22 0051   hydrALAZINE (APRESOLINE) injection 5 mg  5 mg Intravenous Q8H PRN Hugelmeyer, Alexis, DO       HYDROcodone-acetaminophen (NORCO/VICODIN) 5-325 MG per tablet 1-2 tablet  1-2 tablet Oral Q4H PRN Hugelmeyer, Alexis, DO       hydrOXYzine (ATARAX) tablet 10 mg  10 mg Oral BID Hugelmeyer, Alexis, DO   10 mg at 12/05/22 0052   insulin aspart (novoLOG) injection 0-9 Units  0-9 Units Subcutaneous TID WC Hugelmeyer, Alexis, DO       insulin glargine-yfgn (SEMGLEE) injection 19 Units  19 Units Subcutaneous Daily Hugelmeyer, Alexis, DO       ipratropium (ATROVENT) nebulizer solution 0.5 mg  0.5 mg Nebulization Q6H PRN Hugelmeyer, Alexis, DO       ipratropium-albuterol (DUONEB) 0.5-2.5 (3) MG/3ML nebulizer solution 3 mL  3 mL Inhalation Q6H PRN Hugelmeyer, Alexis, DO       isosorbide mononitrate (ISMO) tablet 10 mg  10 mg Oral QHS Hugelmeyer, Alexis, DO   10 mg at 12/05/22 4782   lipase/protease/amylase (CREON) capsule 24,000 Units  24,000 Units Oral TID WC Hugelmeyer, Alexis, DO       loratadine (CLARITIN) tablet 10 mg  10 mg Oral Daily Hugelmeyer, Alexis, DO       morphine (PF) 2 MG/ML injection 1 mg  1 mg Intravenous Q6H PRN Hugelmeyer, Alexis, DO       ondansetron (ZOFRAN) tablet 4 mg  4 mg Oral Q6H PRN Hugelmeyer, Alexis, DO       Or   ondansetron (ZOFRAN) injection 4 mg  4 mg Intravenous Q6H PRN Hugelmeyer, Alexis, DO       pantoprazole (PROTONIX) injection 40 mg  40 mg Intravenous Q12H Hugelmeyer, Alexis, DO   40 mg at 12/05/22 0029   sodium chloride flush (NS) 0.9 % injection 3 mL  3 mL  Intravenous Q12H Hugelmeyer, Alexis, DO   3 mL at 12/05/22 0029   traZODone (DESYREL) tablet 25 mg  25 mg Oral QHS PRN Hugelmeyer, Alexis, DO        Allergies as of 12/04/2022   (No Known Allergies)    Review of Systems  Constitutional:  Negative for chills, fever and weight loss.  HENT:  Negative for hearing loss and tinnitus.   Eyes:  Negative for blurred vision and double vision.  Respiratory:  Negative for cough and hemoptysis.   Cardiovascular:  Negative for chest pain and palpitations.  Gastrointestinal:  Positive for constipation. Negative for abdominal pain, blood in stool, diarrhea, heartburn, melena, nausea and vomiting.  Genitourinary:  Negative for dysuria and urgency.  Musculoskeletal:  Negative for myalgias and neck pain.  Skin:  Negative for itching and rash.  Neurological:  Negative for seizures and loss of consciousness.  Psychiatric/Behavioral:  Negative for depression and suicidal ideas.        Physical Exam:  Vital signs in last 24 hours: Temp:  [97.5 F (36.4 C)-99 F (37.2 C)] 97.9 F (36.6 C) (08/28 0545) Pulse Rate:  [73-80] 76 (08/28 0545) Resp:  [15-20] 19 (08/28 0545) BP: (121-154)/(53-67) 121/57 (08/28 0545) SpO2:  [100 %] 100 % (08/28 0732)   Last BM recorded by nurses in past 5 days No data recorded  Physical Exam Constitutional:      Appearance: Normal appearance.  HENT:     Head: Normocephalic and atraumatic.     Nose: Nose normal. No congestion.  Mouth/Throat:     Mouth: Mucous membranes are moist.     Pharynx: Oropharynx is clear.  Eyes:     Extraocular Movements: Extraocular movements intact.     Comments: Conjunctival pallor  Cardiovascular:     Rate and Rhythm: Normal rate and regular rhythm.  Pulmonary:     Effort: Pulmonary effort is normal. No respiratory distress.  Abdominal:     General: Bowel sounds are normal. There is no distension.     Palpations: Abdomen is soft. There is no mass.     Tenderness: There is no  abdominal tenderness. There is no guarding or rebound.     Hernia: No hernia is present.  Musculoskeletal:        General: No swelling. Normal range of motion.     Cervical back: Normal range of motion and neck supple.  Skin:    General: Skin is warm and dry.     Coloration: Skin is not jaundiced.  Neurological:     General: No focal deficit present.     Mental Status: He is oriented to person, place, and time.  Psychiatric:        Mood and Affect: Mood normal.        Behavior: Behavior normal.        Thought Content: Thought content normal.        Judgment: Judgment normal.      LAB RESULTS: Recent Labs    12/04/22 1658 12/04/22 2013 12/04/22 2358  WBC 7.4 7.9 8.0  HGB 7.8* 7.3* 6.6*  HCT 24.8* 23.6* 20.6*  PLT 269 260 261   BMET Recent Labs    12/04/22 1658  NA 134*  K 4.0  CL 102  CO2 22  GLUCOSE 136*  BUN 28*  CREATININE 1.81*  CALCIUM 8.5*   LFT Recent Labs    12/04/22 1658  PROT 7.7  ALBUMIN 3.0*  AST 21  ALT 13  ALKPHOS 55  BILITOT 0.9   PT/INR Recent Labs    12/04/22 1658  LABPROT 13.7  INR 1.0    STUDIES: No results found.    Impression     88 y.o. male with past medical history significant for diabetes, hypertension, IDA, CVA (on Plavix last dose presumably 8/27 AM), presents for evaluation of acute on chronic anemia. (baseline around 8, it was 6.8 on presentation), FOBT positive, constipation, last colonoscopy in 2015 with 2 diminutive polyps.  Acute on chronic anemia Hgb 6.6, MCV 95.4 Iron 50, TIBC 311, saturation 16% Ferritin 31 Folate 11.8 BUN 28, creatinine 1.81, GFR 36 FOBT positive Slight drop in hgb from baseline with fobt positive stool. Patient is on plavix with last dose 8/27 AM. Would need 5 day washout before proceeding with EGD/Colonoscopy for further evaluation. Unknown if overt bleeding, will monitor stools while he is here. No recent abdominal imaging. Anemia likely multifactorial in setting of CKD and heme  positive stool.  CKD Near baseline  COPD  Diabetes   Plan   -Continue daily CBC and transfuse as needed to maintain HGB > 7  - CT ab/pelvis with contrast if able with his present kidney function for further evaluation - pending CT may consider EGD +/- colonoscopy, though would need 5 day plavix washout. - continue supportive care - can have clear liquids - continue to hold plavix - continue protonix 40mg  IV BID  Thank you for your kind consultation, we will continue to follow.  Bayley Leanna Sato  12/05/2022, 8:16 AM

## 2022-12-05 NOTE — Progress Notes (Signed)
BLE venous duplex has been completed.   Results can be found under chart review under CV PROC. 12/05/2022 1:10 PM Kayleigh Broadwell RVT, RDMS

## 2022-12-05 NOTE — Progress Notes (Signed)
Update was given to patient's daughter, Ms. Eldridge Abrahams. Made aware that patient will receive blood transfusion. All questions answered. No concerns at time.

## 2022-12-05 NOTE — H&P (View-Only) (Signed)
Attending physician's note   I have taken a history, reviewed the chart, and examined the patient. I performed a substantive portion of this encounter, including complete performance of at least one of the key components, in conjunction with the APP. I agree with the APP's note, impression, and recommendations with my edits.  88 year old male with medical history as outlined below, to include history of CVA (on Plavix), diabetes, HTN, IDA, transferred from SNF for evaluation of acute on chronic anemia noted on routine labs.  No reported hematochezia or melena, although patient quite limited in ability to provide history.  Admission evaluation notable for the following: - H/H 6.6/20.6 with MCV/RDW 95/15 (baseline ~8-8.5) - Ferritin 31, iron 50, TIBC 311, sat 16%.  Normal folate - FOBT positive - INR 1.0  I discussed the finding of acute on chronic anemia with heme positive stool in the absence of overt bleeding with the patient and his son at bedside today.  Discussed role/utility of endoscopic evaluation.  He is reluctant to proceed with colonoscopy, mainly due to concerns that he would tolerate a bowel prep.  After good conversation, we elected for the following:  - 2 unit PRBC transfusion today with posttransfusion CBC check - CT A/P today - Continue trending CBCs with blood products as needed per protocol - Clears okay - N.p.o. at midnight for tentative EGD tomorrow - Continue holding Plavix - If CT and EGD both unrevealing, patient and son would again like to discuss role/utility of colonoscopy.  Given his chronic constipation this may require multiple days of clears and possible multiday prep anyway - Started on high-dose PPI on arrival   [place], [place], [place] [phone] office          Consultation  Referring Provider:  [facility]  Primary Care Physician:  [facility] Primary Gastroenterologist:  Dr. Leone Payor       Reason for Consultation: Acute on  chronic anemia     LOS: 1 day          HPI:   Justin [name] is a 88 y.o. male with past medical history significant for diabetes, hypertension, IDA, CVA (on Plavix), presents for evaluation of acute on chronic anemia.  Patient presents from SNF after having routine labs drawn and was found to have Hgb of 6.4.  Appears his baseline is around 8. Recently admitted 11/06/22 for AKI secondary to dehydration.  Pertinent lab values Hgb 6.6, MCV 95.4 Iron 50, TIBC 311, saturation 16% Ferritin 31 Folate 11.8 BUN 28, creatinine 1.81, GFR 36 FOBT positive  Patient's son and daughter are present and help with HPI.  They report since being put in SNF 4 years ago he has struggled with constipation and has had to be on laxatives.  Patient and family unsure when last bowel movement was.  Unsure if he is having bloody bowel movements.  Patient denies nausea, vomiting, pain.  Denies weakness/fatigue.  Of note his last colonoscopy was in 2015 which showed 2 diminutive sessile polyps and was otherwise normal.  Patient is on Plavix.  Unsure when his last doses, assuming yesterday a.m.   PREVIOUS GI WORKUP   Last colonoscopy 04/2013 for screening - 2 diminutive sessile polyps in ascending colon. - Otherwise normal  Past Medical History:  Diagnosis Date   Acute metabolic encephalopathy 05/24/2021   Arthritis    Diabetes mellitus    HTN (hypertension)    Hypercholesteremia    Iron deficiency    Pancreatitis, acute 06/02/2012  Diagnosed 05/2012    Personal history of colonic adenomas 04/15/2013   04/15/2013 2 diminutive polyps     Stroke [facility])    TIA (transient ischemic attack)     Surgical History:  He  has a past surgical history that includes Back surgery (1985); Neck surgery (1985); Temporal artery biopsy / ligation (02/26/2011); Artery Biopsy (02/26/2011); Cholecystectomy; Colonoscopy; Upper gastrointestinal endoscopy; EUS; Amputation toe (Right, 11/26/2021); ABDOMINAL AORTOGRAM W/LOWER EXTREMITY  (Right, 11/24/2021); PERIPHERAL VASCULAR BALLOON ANGIOPLASTY (Right, 11/24/2021); and Transmetatarsal amputation (Right, 01/28/2022). Family History:  His family history includes Amblyopia in his father; Diabetes in his sister; Heart attack in his sister; Heart disease in his mother. Social History:   reports that he quit smoking about 32 years ago. His smoking use included cigarettes. He started smoking about 67 years ago. He has never used smokeless tobacco. He reports that he does not drink alcohol and does not use drugs.  Prior to Admission medications   Medication Sig Start Date End Date Taking? Authorizing Provider  acetaminophen (TYLENOL) 325 MG tablet Take 650 mg by mouth in the morning.   Yes [provider]  aspirin 81 MG chewable tablet Chew 81 mg by mouth daily.   Yes [provider]  atorvastatin (LIPITOR) 40 MG tablet Take 40 mg by mouth at bedtime. 11/03/21  Yes [provider]  BREO ELLIPTA 100-25 MCG/ACT AEPB Inhale 1 puff into the lungs daily. 09/14/22  Yes [provider]  clopidogrel (PLAVIX) 75 MG tablet Take 1 tablet (75 mg total) by mouth daily with breakfast. 11/28/21  Yes Rhetta Mura, MD  [employer] SALINE NOSE SPRAY NA Place 2 sprays into the nose 2 (two) times daily.   Yes [provider]  divalproex (DEPAKOTE SPRINKLE) 125 MG capsule Take 125 mg by mouth 2 (two) times daily. 10/27/22  Yes [provider]  docusate sodium (COLACE) 100 MG capsule Take 100 mg by mouth 2 (two) times daily.   Yes [provider]  Dulaglutide 1.5 MG/0.5ML SOPN Inject 1.5 mg into the skin once a week. tuesday   Yes [provider]  ferrous gluconate (FERGON) 324 MG tablet Take 324 mg by mouth daily.   Yes [provider]  gabapentin (NEURONTIN) 100 MG capsule Take 100 mg by mouth 2 (two) times daily. 10/29/22  Yes [provider]  glucagon (GLUCAGON EMERGENCY) 1 MG injection Inject 1 mg into the muscle once as  needed. Patient taking differently: Inject 1 mg into the muscle once as needed (for low blood sugar). 09/29/15  Yes Carlus Pavlov, MD  hydrOXYzine (ATARAX) 10 MG tablet Take 10 mg by mouth 2 (two) times daily. hold for sedation   Yes [provider]  insulin glargine, 2 Unit Dial, (TOUJEO MAX SOLOSTAR) 300 UNIT/ML Solostar Pen Inject 19 Units into the skin daily.   Yes [provider]  Ipratropium-Albuterol (COMBIVENT) 20-100 MCG/ACT AERS respimat Inhale 1 puff into the lungs every 6 (six) hours as needed for wheezing. Patient taking differently: Inhale 1 puff into the lungs every 6 (six) hours as needed (for COPD). 05/25/21  Yes Tyrone Nine, MD  isosorbide mononitrate (ISMO) 10 MG tablet Take 10 mg by mouth at bedtime.   Yes [provider]  loratadine (CLARITIN) 10 MG tablet Take 10 mg by mouth daily.   Yes [provider]  NOVOLOG FLEXPEN 100 UNIT/ML FlexPen Inject 0-8 Units into the skin See admin instructions. Inject 0-8 units into the skin before lunch and supper, per sliding scale:  if is BS 70-200 give 0 units, if BS is 201-250 give 2 units, if BS is 251-300 give 4 units, if BS is 301-350 give 6 units, if BS is 351-400 give 8 units. If blood sugar is greater than 400, call MD. 10/18/21  Yes [provider]  oxycodone (OXY-IR) 5 MG capsule Take 5 mg by mouth every 8 (eight) hours as needed for pain.   Yes [provider]  oxymetazoline (AFRIN) 0.05 % nasal spray Place 2 sprays into both nostrils 2 (two) times daily as needed for congestion.   Yes [provider]  Pancrelipase, Lip-Prot-Amyl, (CREON) 24000-76000 units CPEP Take 1 capsule by mouth 3 (three) times daily. With 4-6 ounces of water   Yes [provider]  Pollen Extracts (PROSTAT PO) Take 30 mLs by mouth 3 (three) times daily.   Yes [provider]  repaglinide (PRANDIN) 0.5 MG tablet Take 0.5 mg by mouth 2 (two) times daily before a meal. 10/15/22  Yes  [provider]  Skin Protectants, Misc. (MINERIN CREME EX) Apply 1 Application topically daily. Both legs and feet.   Yes [provider]  Alcohol Swabs PADS Use to check blood sugar 3 times daily. Dx: E10.9 07/21/15   Trena Platt D, PA  Blood Glucose Calibration (ACCU-CHEK AVIVA) SOLN Use to check blood sugar 3 times daily. Dx: E10.9 07/21/15   Trena Platt D, PA  Blood Glucose Monitoring Suppl (ACCU-CHEK AVIVA PLUS) w/Device KIT Use to check blood sugar 3 times daily. Dx: E10.9 09/27/15   Ethelda Chick, MD  cefdinir (OMNICEF) 300 MG capsule Take 1 capsule (300 mg total) by mouth every evening. Patient not taking: Reported on 12/04/2022 11/09/22   Lonia Blood, MD  glucose blood (ACCU-CHEK AVIVA PLUS) test strip Use to check blood sugar 3 times daily. Dx: E10.9 11/17/15   Carlus Pavlov, MD  Insulin Pen Needle 32G X 4 MM MISC Use as directed to inject insulin tidwc 12/14/12   Sherren Mocha, MD  Lancets (ACCU-CHEK SOFT [facility]) lancets Use to check blood sugar 3 times daily. Dx: E10.9 07/21/15   Garnetta Buddy, PA  Spacer/Aero-Holding [facility] Use as directed with combivent. Ok to dispense any spacer that works with the inhaler being rxed to pt. 09/20/15   Sherren Mocha, MD    Current Facility-Administered Medications  Medication Dose Route Frequency Provider Last Rate Last Admin   0.9 %  sodium chloride infusion   Intravenous Continuous Hugelmeyer, Alexis, DO 75 mL/hr at 12/05/22 0029 New Bag at 12/05/22 0029   acetaminophen (TYLENOL) tablet 650 mg  650 mg Oral Q6H PRN Hugelmeyer, Alexis, DO       Or   acetaminophen (TYLENOL) suppository 650 mg  650 mg Rectal Q6H PRN Hugelmeyer, Alexis, DO       albuterol (PROVENTIL) (2.5 MG/3ML) 0.083% nebulizer solution 2.5 mg  2.5 mg Nebulization Q6H PRN Hugelmeyer, Alexis, DO       atorvastatin (LIPITOR) tablet 40 mg  40 mg Oral QHS Hugelmeyer, Alexis, DO   40 mg at 12/05/22 0052   divalproex (DEPAKOTE SPRINKLE) capsule  125 mg  125 mg Oral BID Hugelmeyer, Alexis, DO   125 mg at 12/05/22 0052   ferrous gluconate (FERGON) tablet 324 mg  324 mg Oral Q breakfast Hugelmeyer, Alexis, DO       fluticasone furoate-vilanterol (BREO ELLIPTA) 100-25 MCG/ACT 1 puff  1 puff Inhalation Daily Hugelmeyer, Alexis, DO   1 puff at 12/05/22 0732   gabapentin (  NEURONTIN) capsule 100 mg  100 mg Oral BID Hugelmeyer, Alexis, DO   100 mg at 12/05/22 0051   hydrALAZINE (APRESOLINE) injection 5 mg  5 mg Intravenous Q8H PRN Hugelmeyer, Alexis, DO       HYDROcodone-acetaminophen (NORCO/VICODIN) 5-325 MG per tablet 1-2 tablet  1-2 tablet Oral Q4H PRN Hugelmeyer, Alexis, DO       hydrOXYzine (ATARAX) tablet 10 mg  10 mg Oral BID Hugelmeyer, Alexis, DO   10 mg at 12/05/22 0052   insulin aspart (novoLOG) injection 0-9 Units  0-9 Units Subcutaneous TID WC Hugelmeyer, Alexis, DO       insulin glargine-yfgn (SEMGLEE) injection 19 Units  19 Units Subcutaneous Daily Hugelmeyer, Alexis, DO       ipratropium (ATROVENT) nebulizer solution 0.5 mg  0.5 mg Nebulization Q6H PRN Hugelmeyer, Alexis, DO       ipratropium-albuterol (DUONEB) 0.5-2.5 (3) MG/3ML nebulizer solution 3 mL  3 mL Inhalation Q6H PRN Hugelmeyer, Alexis, DO       isosorbide mononitrate (ISMO) tablet 10 mg  10 mg Oral QHS Hugelmeyer, Alexis, DO   10 mg at 12/05/22 4782   lipase/protease/amylase (CREON) capsule 24,000 Units  24,000 Units Oral TID WC Hugelmeyer, Alexis, DO       loratadine (CLARITIN) tablet 10 mg  10 mg Oral Daily Hugelmeyer, Alexis, DO       morphine (PF) 2 MG/ML injection 1 mg  1 mg Intravenous Q6H PRN Hugelmeyer, Alexis, DO       ondansetron (ZOFRAN) tablet 4 mg  4 mg Oral Q6H PRN Hugelmeyer, Alexis, DO       Or   ondansetron (ZOFRAN) injection 4 mg  4 mg Intravenous Q6H PRN Hugelmeyer, Alexis, DO       pantoprazole (PROTONIX) injection 40 mg  40 mg Intravenous Q12H Hugelmeyer, Alexis, DO   40 mg at 12/05/22 0029   sodium chloride flush (NS) 0.9 % injection 3 mL  3 mL  Intravenous Q12H Hugelmeyer, Alexis, DO   3 mL at 12/05/22 0029   traZODone (DESYREL) tablet 25 mg  25 mg Oral QHS PRN Hugelmeyer, Alexis, DO        Allergies as of 12/04/2022   (No Known Allergies)    Review of Systems  Constitutional:  Negative for chills, fever and weight loss.  HENT:  Negative for hearing loss and tinnitus.   Eyes:  Negative for blurred vision and double vision.  Respiratory:  Negative for cough and hemoptysis.   Cardiovascular:  Negative for chest pain and palpitations.  Gastrointestinal:  Positive for constipation. Negative for abdominal pain, blood in stool, diarrhea, heartburn, melena, nausea and vomiting.  Genitourinary:  Negative for dysuria and urgency.  Musculoskeletal:  Negative for myalgias and neck pain.  Skin:  Negative for itching and rash.  Neurological:  Negative for seizures and loss of consciousness.  Psychiatric/Behavioral:  Negative for depression and suicidal ideas.        Physical Exam:  Vital signs in last 24 hours: Temp:  [97.5 F (36.4 C)-99 F (37.2 C)] 97.9 F (36.6 C) (08/28 0545) Pulse Rate:  [73-80] 76 (08/28 0545) Resp:  [15-20] 19 (08/28 0545) BP: (121-154)/(53-67) 121/57 (08/28 0545) SpO2:  [100 %] 100 % (08/28 0732)   Last BM recorded by nurses in past 5 days No data recorded  Physical Exam Constitutional:      Appearance: Normal appearance.  HENT:     Head: Normocephalic and atraumatic.     Nose: Nose normal. No congestion.  Mouth/Throat:     Mouth: Mucous membranes are moist.     Pharynx: Oropharynx is clear.  Eyes:     Extraocular Movements: Extraocular movements intact.     Comments: Conjunctival pallor  Cardiovascular:     Rate and Rhythm: Normal rate and regular rhythm.  Pulmonary:     Effort: Pulmonary effort is normal. No respiratory distress.  Abdominal:     General: Bowel sounds are normal. There is no distension.     Palpations: Abdomen is soft. There is no mass.     Tenderness: There is no  abdominal tenderness. There is no guarding or rebound.     Hernia: No hernia is present.  Musculoskeletal:        General: No swelling. Normal range of motion.     Cervical back: Normal range of motion and neck supple.  Skin:    General: Skin is warm and dry.     Coloration: Skin is not jaundiced.  Neurological:     General: No focal deficit present.     Mental Status: He is oriented to person, place, and time.  Psychiatric:        Mood and Affect: Mood normal.        Behavior: Behavior normal.        Thought Content: Thought content normal.        Judgment: Judgment normal.      LAB RESULTS: Recent Labs    12/04/22 1658 12/04/22 2013 12/04/22 2358  WBC 7.4 7.9 8.0  HGB 7.8* 7.3* 6.6*  HCT 24.8* 23.6* 20.6*  PLT 269 260 261   BMET Recent Labs    12/04/22 1658  NA 134*  K 4.0  CL 102  CO2 22  GLUCOSE 136*  BUN 28*  CREATININE 1.81*  CALCIUM 8.5*   LFT Recent Labs    12/04/22 1658  PROT 7.7  ALBUMIN 3.0*  AST 21  ALT 13  ALKPHOS 55  BILITOT 0.9   PT/INR Recent Labs    12/04/22 1658  LABPROT 13.7  INR 1.0    STUDIES: No results found.    Impression     88 y.o. male with past medical history significant for diabetes, hypertension, IDA, CVA (on Plavix last dose presumably 8/27 AM), presents for evaluation of acute on chronic anemia. (baseline around 8, it was 6.8 on presentation), FOBT positive, constipation, last colonoscopy in 2015 with 2 diminutive polyps.  Acute on chronic anemia Hgb 6.6, MCV 95.4 Iron 50, TIBC 311, saturation 16% Ferritin 31 Folate 11.8 BUN 28, creatinine 1.81, GFR 36 FOBT positive Slight drop in hgb from baseline with fobt positive stool. Patient is on plavix with last dose 8/27 AM. Would need 5 day washout before proceeding with EGD/Colonoscopy for further evaluation. Unknown if overt bleeding, will monitor stools while he is here. No recent abdominal imaging. Anemia likely multifactorial in setting of CKD and heme  positive stool.  CKD Near baseline  COPD  Diabetes   Plan   -Continue daily CBC and transfuse as needed to maintain HGB > 7  - CT ab/pelvis with contrast if able with his present kidney function for further evaluation - pending CT may consider EGD +/- colonoscopy, though would need 5 day plavix washout. - continue supportive care - can have clear liquids - continue to hold plavix - continue protonix 40mg  IV BID  Thank you for your kind consultation, we will continue to follow.  Bayley Leanna Sato  12/05/2022, 8:16 AM

## 2022-12-06 ENCOUNTER — Inpatient Hospital Stay (HOSPITAL_COMMUNITY): Payer: Medicare Other | Admitting: Registered Nurse

## 2022-12-06 ENCOUNTER — Encounter (HOSPITAL_COMMUNITY)
Admission: EM | Disposition: A | Discharge status: Skilled Nursing Facility | Payer: Self-pay | Source: Skilled Nursing Facility | Attending: Internal Medicine

## 2022-12-06 ENCOUNTER — Inpatient Hospital Stay (HOSPITAL_COMMUNITY): Payer: Self-pay | Admitting: Registered Nurse

## 2022-12-06 ENCOUNTER — Encounter (HOSPITAL_COMMUNITY): Payer: Self-pay | Admitting: Family Medicine

## 2022-12-06 ENCOUNTER — Other Ambulatory Visit: Payer: Self-pay

## 2022-12-06 DIAGNOSIS — Z87891 Personal history of nicotine dependence: Secondary | ICD-10-CM

## 2022-12-06 DIAGNOSIS — K449 Diaphragmatic hernia without obstruction or gangrene: Secondary | ICD-10-CM

## 2022-12-06 DIAGNOSIS — K222 Esophageal obstruction: Secondary | ICD-10-CM

## 2022-12-06 DIAGNOSIS — I129 Hypertensive chronic kidney disease with stage 1 through stage 4 chronic kidney disease, or unspecified chronic kidney disease: Secondary | ICD-10-CM

## 2022-12-06 DIAGNOSIS — K922 Gastrointestinal hemorrhage, unspecified: Secondary | ICD-10-CM | POA: Diagnosis not present

## 2022-12-06 DIAGNOSIS — K31819 Angiodysplasia of stomach and duodenum without bleeding: Secondary | ICD-10-CM | POA: Diagnosis not present

## 2022-12-06 DIAGNOSIS — K5521 Angiodysplasia of colon with hemorrhage: Secondary | ICD-10-CM

## 2022-12-06 DIAGNOSIS — N1832 Chronic kidney disease, stage 3b: Secondary | ICD-10-CM

## 2022-12-06 HISTORY — PX: HEMOSTASIS CLIP PLACEMENT: SHX6857

## 2022-12-06 HISTORY — PX: HOT HEMOSTASIS: SHX5433

## 2022-12-06 HISTORY — PX: ESOPHAGOGASTRODUODENOSCOPY: SHX5428

## 2022-12-06 LAB — TYPE AND SCREEN
ABO/RH(D): A POS
Antibody Screen: NEGATIVE
Unit division: 0
Unit division: 0

## 2022-12-06 LAB — CBC
HCT: 26.3 % — ABNORMAL LOW (ref 39.0–52.0)
Hemoglobin: 8.8 g/dL — ABNORMAL LOW (ref 13.0–17.0)
MCH: 30 pg (ref 26.0–34.0)
MCHC: 33.5 g/dL (ref 30.0–36.0)
MCV: 89.8 fL (ref 80.0–100.0)
Platelets: 238 10*3/uL (ref 150–400)
RBC: 2.93 MIL/uL — ABNORMAL LOW (ref 4.22–5.81)
RDW: 16.7 % — ABNORMAL HIGH (ref 11.5–15.5)
WBC: 8.5 10*3/uL (ref 4.0–10.5)
nRBC: 0 % (ref 0.0–0.2)

## 2022-12-06 LAB — COMPREHENSIVE METABOLIC PANEL
ALT: 11 U/L (ref 0–44)
AST: 18 U/L (ref 15–41)
Albumin: 2.6 g/dL — ABNORMAL LOW (ref 3.5–5.0)
Alkaline Phosphatase: 47 U/L (ref 38–126)
Anion gap: 7 (ref 5–15)
BUN: 18 mg/dL (ref 8–23)
CO2: 23 mmol/L (ref 22–32)
Calcium: 8.5 mg/dL — ABNORMAL LOW (ref 8.9–10.3)
Chloride: 104 mmol/L (ref 98–111)
Creatinine, Ser: 1.46 mg/dL — ABNORMAL HIGH (ref 0.61–1.24)
GFR, Estimated: 47 mL/min — ABNORMAL LOW (ref >60)
Glucose, Bld: 117 mg/dL — ABNORMAL HIGH (ref 70–99)
Potassium: 3.8 mmol/L (ref 3.5–5.1)
Sodium: 134 mmol/L — ABNORMAL LOW (ref 135–145)
Total Bilirubin: 1 mg/dL (ref 0.3–1.2)
Total Protein: 6.9 g/dL (ref 6.5–8.1)

## 2022-12-06 LAB — BPAM RBC
Blood Product Expiration Date: 202409042359
Blood Product Expiration Date: 202409172359
ISSUE DATE / TIME: 202408280510
ISSUE DATE / TIME: 202408280943
Unit Type and Rh: 6200
Unit Type and Rh: 6200

## 2022-12-06 LAB — GLUCOSE, CAPILLARY
Glucose-Capillary: 92 mg/dL (ref 70–99)
Glucose-Capillary: 120 mg/dL — ABNORMAL HIGH (ref 70–99)
Glucose-Capillary: 259 mg/dL — ABNORMAL HIGH (ref 70–99)
Glucose-Capillary: 264 mg/dL — ABNORMAL HIGH (ref 70–99)

## 2022-12-06 LAB — MAGNESIUM: Magnesium: 1.8 mg/dL (ref 1.7–2.4)

## 2022-12-06 SURGERY — EGD (ESOPHAGOGASTRODUODENOSCOPY)
Anesthesia: General

## 2022-12-06 MED ORDER — EPHEDRINE SULFATE-NACL 50-0.9 MG/10ML-% IV SOSY
PREFILLED_SYRINGE | INTRAVENOUS | Status: DC | PRN
  Administered 2022-12-06: 5 mg via INTRAVENOUS

## 2022-12-06 MED ORDER — DEXAMETHASONE SODIUM PHOSPHATE 10 MG/ML IJ SOLN
INTRAMUSCULAR | Status: DC | PRN
  Administered 2022-12-06: 4 mg via INTRAVENOUS

## 2022-12-06 MED ORDER — LIDOCAINE 2% (20 MG/ML) 5 ML SYRINGE
INTRAMUSCULAR | Status: DC | PRN
  Administered 2022-12-06: 60 mg via INTRAVENOUS

## 2022-12-06 MED ORDER — PROPOFOL 10 MG/ML IV BOLUS
INTRAVENOUS | Status: DC | PRN
  Administered 2022-12-06: 100 mg via INTRAVENOUS
  Administered 2022-12-06: 50 mg via INTRAVENOUS

## 2022-12-06 MED ORDER — PHENYLEPHRINE 80 MCG/ML (10ML) SYRINGE FOR IV PUSH (FOR BLOOD PRESSURE SUPPORT)
PREFILLED_SYRINGE | INTRAVENOUS | Status: DC | PRN
  Administered 2022-12-06 (×2): 80 ug via INTRAVENOUS
  Administered 2022-12-06 (×3): 160 ug via INTRAVENOUS

## 2022-12-06 MED ORDER — ONDANSETRON HCL 4 MG/2ML IJ SOLN
INTRAMUSCULAR | Status: DC | PRN
Start: 2022-12-06 — End: 2022-12-06
  Administered 2022-12-06: 4 mg via INTRAVENOUS

## 2022-12-06 MED ORDER — SUCCINYLCHOLINE CHLORIDE 200 MG/10ML IV SOSY
PREFILLED_SYRINGE | INTRAVENOUS | Status: DC | PRN
  Administered 2022-12-06: 100 mg via INTRAVENOUS

## 2022-12-06 MED ORDER — SODIUM CHLORIDE 0.9 % IV SOLN: INTRAVENOUS | Status: DC

## 2022-12-06 MED ORDER — LACTATED RINGERS IV SOLN: INTRAVENOUS | Status: DC

## 2022-12-06 NOTE — Inpatient Diabetes Management (Signed)
Inpatient Diabetes Program Recommendations  AACE/ADA: New Consensus Statement on Inpatient Glycemic Control   Target Ranges:  Prepandial:   less than 140 mg/dL      Peak postprandial:   less than 180 mg/dL (1-2 hours)      Critically ill patients:  140 - 180 mg/dL    Latest Reference Range & Units 12/05/22 08:27 12/05/22 09:27 12/05/22 12:27 12/05/22 13:21 12/05/22 16:26 12/05/22 18:22 12/05/22 21:14 12/06/22 07:02  Glucose-Capillary 70 - 99 mg/dL 47 (L) 161 (H) 65 (L) 096 (H) 69 (L) 115 (H) 180 (H) 92   Review of Glycemic Control  Diabetes history: DM2 Outpatient Diabetes medications: Trulicity 1.5 mg Qweek (Tuesday), Toujeo 19 units daily, Novolog 0-8 units BID (before lunch and supper), Prandin 0.5 mg BID Current orders for Inpatient glycemic control: Semglee 14 units daily, Novolog 0-9 units TID with meals  Inpatient Diabetes Program Recommendations:    Insulin: No insulin given since arrival to hospital. Patient experienced hypoglycemia several times on 8/28 and CBG 92 mg/dl this morning. Please consider discontinuing Semglee and adding Novolog 0-5 units at bedtime for bedtime correction.  Thanks, Orlando Penner, RN, MSN, CDCES Diabetes Coordinator Inpatient Diabetes Program [phone] (Team Pager from 8am to 5pm)

## 2022-12-06 NOTE — Plan of Care (Signed)

## 2022-12-06 NOTE — Addendum Note (Signed)
Addendum  created 12/06/22 0904 by Camillia Herter, CRNA   Clinical Note Signed, Intraprocedure Blocks edited, SmartForm saved

## 2022-12-06 NOTE — Progress Notes (Signed)
PROGRESS NOTE  Justin [name] UJW:[id number] DOB: May 26, 1934 DOA: 12/04/2022 PCP: [facility]   LOS: 2 days   Brief Narrative / Interim history: 88 year old male with chronic wounds, HTN, HLD, peripheral vascular disease, CKD comes into the hospital with anemia.  Routine labs were drawn in his living facility and was found to have a hemoglobin of 6.4.  Fecal occult was positive  Subjective / 24h Interval events: No further pain, no abdominal pain, no nausea or vomiting.  Assesement and Plan: Principal problem Anemia, presumed due to blood loss -possible that he has a slow GI bleed.  Hemoglobin of 6.4 but he was asymptomatic.  Fecal occult was positive.  Gastroenterology consulted, appreciate input. -Continue PPI.  Hold home Plavix -Underwent an EGD this morning which showed 7 nonbleeding angiectasia in the stomach status post APC and conditional MR clips placed.  There was 1 single angiectasia in the duodenum status post APC as well.  Continue to monitor and follow CBCs, allow liquid diet today  Active problems CKD 3B -baseline creatinine 1.6-2, remains at base today  Hyperlipidemia-continue Lipitor  Hypertension-continue Imdur  History of COPD-continue inhalers, respiratory status stable and at baseline  PAD, left toe ulcers -left first and third toes, superficial.  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  Decrease glargine due to hypoglycemic episode this morning while n.p.o.  Lab Results  Component Value Date   HGBA1C 6.2 (H) 11/07/2022   CBG (last 3)  Recent Labs    12/05/22 2114 12/06/22 0702 12/06/22 1131  GLUCAP 180* 92 120*    Scheduled Meds:  atorvastatin  40 mg  Oral QHS   divalproex  125 mg Oral BID   ferrous gluconate  324 mg Oral Q breakfast   fluticasone furoate-vilanterol  1 puff Inhalation Daily   gabapentin  100 mg Oral BID   hydrOXYzine  10 mg Oral BID   insulin aspart  0-9 Units Subcutaneous TID WC   insulin glargine-yfgn  14 Units Subcutaneous Daily   isosorbide mononitrate  10 mg Oral QHS   lipase/protease/amylase  24,000 Units Oral TID WC   loratadine  10 mg Oral Daily   pantoprazole (PROTONIX) IV  40 mg Intravenous Q12H   sodium chloride flush  3 mL Intravenous Q12H   Continuous Infusions:  sodium chloride Stopped (12/05/22 1243)   PRN Meds:.acetaminophen **OR** acetaminophen, albuterol, hydrALAZINE, HYDROcodone-acetaminophen, ipratropium, ipratropium-albuterol, morphine injection, ondansetron **OR** ondansetron (ZOFRAN) IV, traZODone  Current Outpatient Medications  Medication Instructions   acetaminophen (TYLENOL) 650 mg, Oral, Every morning   Alcohol Swabs PADS Use to check blood sugar 3 times daily. Dx: E10.9   aspirin 81 mg, Oral, Daily   atorvastatin (LIPITOR) 40 mg, Oral, Daily at bedtime   Blood Glucose Calibration (ACCU-CHEK AVIVA) SOLN Use to check blood sugar 3 times daily. Dx: E10.9   Blood Glucose Monitoring Suppl (ACCU-CHEK AVIVA PLUS) w/Device KIT Use to check blood sugar 3 times daily. Dx: E10.9   BREO ELLIPTA 100-25 MCG/ACT AEPB 1 puff, Inhalation, Daily   cefdinir (OMNICEF) 300 mg, Oral, Every evening   clopidogrel (PLAVIX) 75 mg, Oral, Daily with breakfast   [employer] SALINE NOSE SPRAY NA 2 sprays, Nasal, 2 times daily   divalproex (DEPAKOTE SPRINKLE) 125 mg, Oral, 2 times daily   docusate sodium (  COLACE) 100 mg, Oral, 2 times daily   Dulaglutide 1.5 mg, Subcutaneous, Weekly, tuesday   ferrous gluconate (FERGON) 324 mg, Oral, Daily   gabapentin (NEURONTIN) 100 mg, Oral, 2 times daily   glucagon 1 mg, Intramuscular, Once PRN   glucose blood (ACCU-CHEK AVIVA PLUS) test strip Use to check blood sugar 3 times daily.  Dx: E10.9   hydrOXYzine (ATARAX) 10 mg, Oral, 2 times daily, hold for sedation   Insulin Pen Needle 32G X 4 MM MISC Use as directed to inject insulin tidwc   Ipratropium-Albuterol (COMBIVENT) 20-100 MCG/ACT AERS respimat 1 puff, Inhalation, Every 6 hours PRN   isosorbide mononitrate (ISMO) 10 mg, Oral, Daily at bedtime   Lancets (ACCU-CHEK SOFT TOUCH) lancets Use to check blood sugar 3 times daily. Dx: E10.9   loratadine (CLARITIN) 10 mg, Oral, Daily   NovoLOG FlexPen 0-8 Units, Subcutaneous, See admin instructions, Inject 0-8 units into the skin before lunch and supper, per sliding scale: if is BS 70-200 give 0 units, if BS is 201-250 give 2 units, if BS is 251-300 give 4 units, if BS is 301-350 give 6 units, if BS is 351-400 give 8 units. If blood sugar is greater than 400, call MD.    oxycodone (OXY-IR) 5 mg, Oral, Every 8 hours PRN   oxymetazoline (AFRIN) 0.05 % nasal spray 2 sprays, Each Nare, 2 times daily PRN   Pancrelipase, Lip-Prot-Amyl, (CREON) 24000-76000 units CPEP 1 capsule, Oral, 3 times daily, With 4-6 ounces of water   Pollen Extracts (PROSTAT PO) 30 mLs, Oral, 3 times daily   repaglinide (PRANDIN) 0.5 mg, Oral, 2 times daily before meals   Skin Protectants, Misc. (MINERIN CREME EX) 1 Application, Apply externally, Daily, Both legs and feet.   Spacer/Aero-Holding Rudean Curt Use as directed with combivent. Ok to dispense any spacer that works with the inhaler being rxed to pt.   Toujeo Max SoloStar 19 Units, Subcutaneous, Daily    Diet Orders (From admission, onward)     Start     Ordered   12/06/22 0916  Diet full liquid Fluid consistency: Thin  Diet effective now       Question:  Fluid consistency:  Answer:  Thin   12/06/22 0915            DVT prophylaxis: SCDs Start: 12/05/22 0012   Lab Results  Component Value Date   PLT 238 12/06/2022      Code Status: Limited: Do not attempt resuscitation (DNR) -DNR-LIMITED -Do Not Intubate/DNI   Family Communication:  Daughter present at bedside  Status is: Inpatient Remains inpatient appropriate because: severity of illness  Level of care: Telemetry Medical  Consultants:  GI  Objective: Vitals:   12/06/22 0841 12/06/22 0845 12/06/22 0850 12/06/22 1105  BP:   132/70   Pulse: 79 77 78 77  Resp: 19 15 15 16   Temp:    98.4 F (36.9 C)  TempSrc:    Oral  SpO2: 93% 93% 94% 98%  Weight:      Height:        Intake/Output Summary (Last 24 hours) at 12/06/2022 1142 Last data filed at 12/06/2022 1052 Gross per 24 hour  Intake 1440.45 ml  Output --  Net 1440.45 ml   Wt Readings from Last 3 Encounters:  12/06/22 63.5 kg  11/08/22 63.6 kg  09/11/22 70.3 kg    Examination:  Constitutional: NAD Eyes: lids and conjunctivae normal, no scleral icterus ENMT: mmm Neck: normal, supple Respiratory: clear to auscultation bilaterally,  no wheezing, no crackles. Cardiovascular: Regular rate and rhythm, no murmurs / rubs / gallops. No LE edema. Abdomen: soft, no distention, no tenderness. Bowel sounds positive.    Data Reviewed: I have independently reviewed following labs and imaging studies   CBC Recent Labs  Lab 12/04/22 1658 12/04/22 2013 12/04/22 2358 12/05/22 1450 12/05/22 1816 12/06/22 0516  WBC 7.4 7.9 8.0 7.2 9.1 8.5  HGB 7.8* 7.3* 6.6* 8.8* 10.0* 8.8*  HCT 24.8* 23.6* 20.6* 26.6* 29.4* 26.3*  PLT 269 260 261 235 211 238  MCV 99.6 100.4* 95.4 90.2 91.3 89.8  MCH 31.3 31.1 30.6 29.8 31.1 30.0  MCHC 31.5 30.9 32.0 33.1 34.0 33.5  RDW 14.9 14.9 14.8 16.5* 16.5* 16.7*  LYMPHSABS 3.0 3.1  --   --   --   --   MONOABS 0.7 0.8  --   --   --   --   EOSABS 0.9* 0.8*  --   --   --   --   BASOSABS 0.1 0.1  --   --   --   --     Recent Labs  Lab 12/04/22 1658 12/05/22 1450 12/06/22 0516  NA 134* 132* 134*  K 4.0 3.8 3.8  CL 102 106 104  CO2 22 21* 23  GLUCOSE 136* 95 117*  BUN 28* 21 18  CREATININE 1.81* 1.39* 1.46*  CALCIUM 8.5* 7.9* 8.5*  AST 21  --  18  ALT 13  --  11   ALKPHOS 55  --  47  BILITOT 0.9  --  1.0  ALBUMIN 3.0*  --  2.6*  MG  --   --  1.8  INR 1.0  --   --     ------------------------------------------------------------------------------------------------------------------ No results for input(s): "CHOL", "HDL", "LDLCALC", "TRIG", "CHOLHDL", "LDLDIRECT" in the last 72 hours.  Lab Results  Component Value Date   HGBA1C 6.2 (H) 11/07/2022   ------------------------------------------------------------------------------------------------------------------ No results for input(s): "TSH", "T4TOTAL", "T3FREE", "THYROIDAB" in the last 72 hours.  Invalid input(s): "FREET3"  Cardiac Enzymes No results for input(s): "CKMB", "TROPONINI", "MYOGLOBIN" in the last 168 hours.  Invalid input(s): "CK" ------------------------------------------------------------------------------------------------------------------    Component Value Date/Time   BNP 55.7 05/18/2013 1528    CBG: Recent Labs  Lab 12/05/22 1626 12/05/22 1822 12/05/22 2114 12/06/22 0702 12/06/22 1131  GLUCAP 69* 115* 180* 92 120*    No results found for this or any previous visit (from the past 240 hour(s)).   Radiology Studies: CT ABDOMEN PELVIS W CONTRAST  Result Date: 12/05/2022 CLINICAL DATA:  Acute on chronic anemia, concern for occult malignancy EXAM: CT ABDOMEN AND PELVIS WITH CONTRAST TECHNIQUE: Multidetector CT imaging of the abdomen and pelvis was performed using the standard protocol following bolus administration of intravenous contrast. RADIATION DOSE REDUCTION: This exam was performed according to the departmental dose-optimization program which includes automated exposure control, adjustment of the mA and/or kV according to patient size and/or use of iterative reconstruction technique. CONTRAST:  75mL OMNIPAQUE IOHEXOL 350 MG/ML SOLN COMPARISON:  01/03/2013, 05/21/2021 FINDINGS: Lower chest: Linear area of consolidation of the left lateral lung base, favor  atelectasis or scarring over acute airspace disease. No effusion or pneumothorax. Hepatobiliary: Numerous punctate parenchymal calcifications are again seen within the posterior aspect right lobe liver, increased since 2014. There is no associated mass lesion. No other focal parenchymal liver abnormalities. Gallbladder is surgically absent. No biliary duct dilation. Pancreas: Unremarkable. No pancreatic ductal dilatation or surrounding inflammatory changes. Spleen: Normal in size without focal abnormality.  Adrenals/Urinary Tract: Numerous vascular calcifications are seen at the renal hila. No evidence of urinary tract calculi or obstructive uropathy. The adrenals are unremarkable. Bladder is only minimally distended, with nonspecific bladder wall thickening. No filling defects. Stomach/Bowel: There is a large amount of retained stool within the rectal vault consistent with fecal impaction. No associated wall thickening to suggest stercoral colitis. Significant fecal retention throughout the remainder of the colon consistent with constipation. No bowel obstruction or ileus. Normal appendix right lower quadrant. No acute inflammatory changes. Small hiatal hernia. Vascular/Lymphatic: Aortic atherosclerosis. No enlarged abdominal or pelvic lymph nodes. Reproductive: The prostate is not enlarged. Other: No free fluid or free intraperitoneal gas. No abdominal wall hernia. Musculoskeletal: Bones are diffusely osteopenic. Chronic L1 and L3 compression deformities are again noted. No acute bony abnormalities. Reconstructed images demonstrate no additional findings. IMPRESSION: 1. Large amount of retained stool throughout the colon, most pronounced within the rectal vault. Findings are consistent with constipation and superimposed fecal impaction. No evidence of stercoral colitis. 2. Otherwise no acute intra-abdominal or intrapelvic process. 3. Increased punctate parenchymal calcifications within the posterior right lobe  liver, without associated mass lesion. These are of uncertain etiology, but likely benign process given longstanding presence within the liver. 4.  Aortic Atherosclerosis (ICD10-I70.0). Electronically Signed   By: Sharlet Salina M.D.   On: 12/05/2022 21:39   VAS [place] LOWER EXTREMITY VENOUS (DVT)  Result Date: 12/05/2022  Lower Venous DVT Study Patient Name:  Justin [name]  Date of Exam:   12/05/2022 Medical Rec #: [id number]         Accession #:    [id number] Date of Birth: May 29, 1934         Patient Gender: M Patient Age:   33 years Exam Location:  [facility] Procedure:      VAS [place] LOWER EXTREMITY VENOUS (DVT) Referring Phys: [name] --------------------------------------------------------------------------------  Indications: BLEV for left leg pain.  Comparison Study: No previous exams Performing Technologist: Jody Hill RVT, RDMS  Examination Guidelines: A complete evaluation includes B-mode imaging, spectral Doppler, color Doppler, and power Doppler as needed of all accessible portions of each vessel. Bilateral testing is considered an integral part of a complete examination. Limited examinations for reoccurring indications may be performed as noted. The reflux portion of the exam is performed with the patient in reverse Trendelenburg.  +---------+---------------+---------+-----------+----------+-------------------+ RIGHT    CompressibilityPhasicitySpontaneityPropertiesThrombus Aging      +---------+---------------+---------+-----------+----------+-------------------+ CFV      Full           Yes      Yes                                      +---------+---------------+---------+-----------+----------+-------------------+ SFJ      Full                                                             +---------+---------------+---------+-----------+----------+-------------------+ FV Prox  Full           Yes      Yes                                       +---------+---------------+---------+-----------+----------+-------------------+  FV Mid   Full           Yes      Yes                                      +---------+---------------+---------+-----------+----------+-------------------+ FV DistalFull           Yes      Yes                                      +---------+---------------+---------+-----------+----------+-------------------+ PFV      Full                                                             +---------+---------------+---------+-----------+----------+-------------------+ POP      Partial        Yes      Yes                                      +---------+---------------+---------+-----------+----------+-------------------+ PTV      Full                                                             +---------+---------------+---------+-----------+----------+-------------------+ PERO                                                  Not well visualized +---------+---------------+---------+-----------+----------+-------------------+   +---------+---------------+---------+-----------+----------+--------------+ LEFT     CompressibilityPhasicitySpontaneityPropertiesThrombus Aging +---------+---------------+---------+-----------+----------+--------------+ CFV      Full           Yes      Yes                                 +---------+---------------+---------+-----------+----------+--------------+ SFJ      Full                                                        +---------+---------------+---------+-----------+----------+--------------+ FV Prox  Full           Yes      Yes                                 +---------+---------------+---------+-----------+----------+--------------+ FV Mid   Full           Yes      Yes                                 +---------+---------------+---------+-----------+----------+--------------+  FV DistalFull           Yes      Yes                                  +---------+---------------+---------+-----------+----------+--------------+ PFV      Full                                                        +---------+---------------+---------+-----------+----------+--------------+ POP      Full           Yes      Yes                                 +---------+---------------+---------+-----------+----------+--------------+ PTV      Full                                                        +---------+---------------+---------+-----------+----------+--------------+ PERO     Full                                                        +---------+---------------+---------+-----------+----------+--------------+     Summary: BILATERAL: - No evidence of deep vein thrombosis seen in the lower extremities, bilaterally. -No evidence of popliteal cyst, bilaterally.   *See table(s) above for measurements and observations. Electronically signed by Sherald Hess MD on 12/05/2022 at 1:16:04 PM.    Final      [name], MD, PhD [facility]  Between 7 am - 7 pm I am available, please contact me via Amion (for emergencies) or Securechat (non urgent messages)  Between 7 pm - 7 am I am not available, please contact night coverage MD/APP via Amion

## 2022-12-06 NOTE — Anesthesia Postprocedure Evaluation (Signed)
Anesthesia Post Note  Patient: Justin [name]  Procedure(s) Performed: ESOPHAGOGASTRODUODENOSCOPY (EGD) HEMOSTASIS CONTROL HOT HEMOSTASIS (ARGON PLASMA COAGULATION/BICAP) HEMOSTASIS CLIP PLACEMENT     Patient location during evaluation: PACU Anesthesia Type: General Level of consciousness: awake and alert, oriented and patient cooperative Pain management: pain level controlled Vital Signs Assessment: post-procedure vital signs reviewed and stable Respiratory status: spontaneous breathing, nonlabored ventilation and respiratory function stable Cardiovascular status: blood pressure returned to baseline and stable Postop Assessment: no apparent nausea or vomiting Anesthetic complications: no   No notable events documented.  Last Vitals:  Vitals:   12/06/22 0835 12/06/22 0840  BP:  113/69  Pulse: 78 79  Resp: 18 20  Temp:    SpO2: 98% 95%    Last Pain:  Vitals:   12/06/22 0835  TempSrc:   PainSc: 0-No pain                 Lannie Fields

## 2022-12-06 NOTE — Anesthesia Preprocedure Evaluation (Addendum)
Anesthesia Evaluation  Patient identified by MRN, date of birth, ID band Patient awake    Reviewed: Allergy & Precautions, H&P , NPO status , Patient's Chart, lab work & pertinent test results  Airway Mallampati: II  TM Distance: >3 FB Neck ROM: Full    Dental no notable dental hx.    Pulmonary former smoker   Pulmonary exam normal breath sounds clear to auscultation       Cardiovascular hypertension, Pt. on medications + Peripheral Vascular Disease  Normal cardiovascular exam Rhythm:Regular Rate:Normal  Echo 05/2021   1. Left ventricular ejection fraction, by estimation, is 60 to 65%. The  left ventricle has normal function. The left ventricle has no regional  wall motion abnormalities.   2. Right ventricular systolic function is normal. The right ventricular  size is normal. There is normal pulmonary artery systolic pressure. The  estimated right ventricular systolic pressure is 33.2 mmHg.   3. The mitral valve is abnormal. Moderate leaflet thickening. No evidence  of mitral valve regurgitation.   4. Tricuspid valve regurgitation is mild to moderate.   5. The aortic valve is tricuspid. Aortic valve regurgitation is not  visualized. AV gradients were not measured   6. The inferior vena cava is normal in size with greater than 50%  respiratory variability, suggesting right atrial pressure of 3 mmHg.      Neuro/Psych  Headaches CVA  negative psych ROS   GI/Hepatic Neg liver ROS,GERD  Controlled,,Routine labs were drawn in his living facility and was found to have a hemoglobin of 6.4.  Fecal occult was positive   Endo/Other  diabetes, Well Controlled, Type 2, Insulin Dependent    Renal/GU Renal Insufficiency and CRFRenal diseaseCr 1.47  negative genitourinary   Musculoskeletal  (+) Arthritis , Osteoarthritis,    Abdominal   Peds negative pediatric ROS (+)  Hematology  (+) Blood dyscrasia, anemia Hb 8.8    Anesthesia Other Findings Dulaglutide LD:   Reproductive/Obstetrics negative OB ROS                             Anesthesia Physical Anesthesia Plan  ASA: 3  Anesthesia Plan: General   Post-op Pain Management:    Induction: Rapid sequence and Cricoid pressure planned  PONV Risk Score and Plan: 2 and Treatment may vary due to age or medical condition, Ondansetron and Dexamethasone  Airway Management Planned: Natural Airway, Simple Face Mask and Oral ETT  Additional Equipment: None  Intra-op Plan:   Post-operative Plan: Extubation in OR  Informed Consent: I have reviewed the patients History and Physical, chart, labs and discussed the procedure including the risks, benefits and alternatives for the proposed anesthesia with the patient or authorized representative who has indicated his/her understanding and acceptance.     Dental advisory given  Plan Discussed with: CRNA  Anesthesia Plan Comments: (Has not been off GLP for 1 week, will RSI/ETT)       Anesthesia Quick Evaluation

## 2022-12-06 NOTE — Op Note (Signed)
[facility] Patient Name: Justin [name] Procedure Date : 12/06/2022 MRN: [id number] Attending MD: Doristine Locks , MD, [id number] Date of Birth: 01/22/1935 CSN: [id number] Age: 88 Admit Type: Inpatient Procedure:                Upper GI endoscopy Indications:              Acute on chronic anemia, Heme positive stool Providers:                Doristine Locks, MD, Fransisca Connors, Geralyn Corwin, RN, Salley Scarlet, Technician Referring MD:              Medicines:                Monitored Anesthesia Care Complications:            No immediate complications. Estimated Blood Loss:     Estimated blood loss was minimal. Procedure:                Pre-Anesthesia Assessment:                           - Prior to the procedure, a History and Physical                            was performed, and patient medications and                            allergies were reviewed. The patient's tolerance of                            previous anesthesia was also reviewed. The risks                            and benefits of the procedure and the sedation                            options and risks were discussed with the patient.                            All questions were answered, and informed consent                            was obtained. Prior Anticoagulants: The patient has                            taken Plavix (clopidogrel), last dose was 2 days                            prior to procedure. ASA Grade Assessment: III - A                            patient with severe systemic disease. After  reviewing the risks and benefits, the patient was                            deemed in satisfactory condition to undergo the                            procedure.                           After obtaining informed consent, the endoscope was                            passed under direct vision. Throughout the                             procedure, the patient's blood pressure, pulse, and                            oxygen saturations were monitored continuously. The                            [id number]-H190 ([id number]) Olympus endoscope was introduced                            through the mouth, and advanced to the third part                            of duodenum. The upper GI endoscopy was                            accomplished without difficulty. The patient                            tolerated the procedure well. Scope In: Scope Out: Findings:      A non-obstructing Schatzki ring was found in the lower third of the       esophagus.      A 2 cm hiatal hernia was present.      Seven small angioectasias with no bleeding were found in the gastric       fundus, in the gastric body and at the incisura. Coagulation for       hemostasis using argon plasma was successful. There was mild persistent       oozing at one of the APC sites in the gastric body. For additional       hemostasis, two hemostatic clips were successfully placed (MR       conditional) at that site. Clip manufacturer: [employer]. There       was no bleeding at the end of the procedure.      A single small angioectasia with typical arborization was found in the       second portion of the duodenum. Coagulation for hemostasis using argon       plasma was successful.      The mucosa was otherwise normal in the remainder of the duodenum. Impression:               - Non-obstructing Schatzki ring.                           -  2 cm hiatal hernia.                           - Seven non-bleeding angioectasias in the stomach.                            Treated with argon plasma coagulation (APC). Clips                            (MR conditional) were placed. Clip manufacturer:                            [employer].                           - A single angioectasia in the duodenum. Treated                            with argon plasma coagulation (APC).                            - Normal mucosa was found in the entire examined                            duodenum.                           - No specimens collected. Recommendation:           - Return patient to hospital ward for ongoing care.                           - Full liquid diet today. If no evidence of ongoing                            bleeding or rebleeding, can advance tomorrow.                           - Continue serial CBC checks.                           - If concern for ongoing bleeding, will plan on VCE                            for further small bowel interrogation +/-                            colonoscopy.                           - Continue present medications.                           - Hold Plavix (clopidogrel) for now. If no evidence  of rebleeding, can likely resume in 2-3 days.                           - Inpatient GI service will continue to follow. Procedure Code(s):        --- Professional ---                           [phone], Esophagogastroduodenoscopy, flexible,                            transoral; with control of bleeding, any method Diagnosis Code(s):        --- Professional ---                           K22.2, Esophageal obstruction                           K44.9, Diaphragmatic hernia without obstruction or                            gangrene                           K31.819, Angiodysplasia of stomach and duodenum                            without bleeding                           D62, Acute posthemorrhagic anemia                           R19.5, Other fecal abnormalities CPT copyright 2022 [employer]. All rights reserved. The codes documented in this report are preliminary and upon coder review may  be revised to meet current compliance requirements. Doristine Locks, MD 12/06/2022 8:39:02 AM Number of Addenda: 0

## 2022-12-06 NOTE — Interval H&P Note (Signed)
History and Physical Interval Note: Was transfused 2 unit PRBCs yesterday with posttransfusion H/H 10/21.4 (from 6.6/20.6 pretransfusion).  Repeat H/H 8.8/26.3 today.  No overt bleeding overnight.  Renal function improved with BUN/creatinine 18/1.46.  CT A/P with large amount of retained stool within the rectal vault without associated wall thickening to suggest a coral colitis.  Stool throughout the colon consistent with constipation.  Otherwise no acute intra-abdominal pathology.   12/06/2022 7:24 AM  Justin [name]  has presented today for surgery, with the diagnosis of anemia.  The various methods of treatment have been discussed with the patient and family. After consideration of risks, benefits and other options for treatment, the patient has consented to  Procedure(s): ESOPHAGOGASTRODUODENOSCOPY (EGD) (N/A) as a surgical intervention.  The patient's history has been reviewed, patient examined, no change in status, stable for surgery.  I have reviewed the patient's chart and labs.  Questions were answered to the patient's satisfaction.     Verlin Dike Radiance Deady

## 2022-12-06 NOTE — Transfer of Care (Signed)
Immediate Anesthesia Transfer of Care Note  Patient: Justin [name]  Procedure(s) Performed: ESOPHAGOGASTRODUODENOSCOPY (EGD) HEMOSTASIS CONTROL HOT HEMOSTASIS (ARGON PLASMA COAGULATION/BICAP) HEMOSTASIS CLIP PLACEMENT  Patient Location: PACU  Anesthesia Type:General  Level of Consciousness: drowsy  Airway & Oxygen Therapy: Patient Spontanous Breathing and Patient connected to face mask  Post-op Assessment: Report given to RN and Post -op Vital signs reviewed and stable  Post vital signs: Reviewed and stable  Last Vitals:  Vitals Value Taken Time  BP 134/62 12/06/22 0820  Temp    Pulse 77 12/06/22 0823  Resp 17 12/06/22 0823  SpO2 100 % 12/06/22 0823  Vitals shown include unfiled device data.  Last Pain:  Vitals:   12/06/22 0714  TempSrc: Temporal  PainSc: 0-No pain         Complications: No notable events documented.

## 2022-12-06 NOTE — Anesthesia Procedure Notes (Addendum)
Procedure Name: Intubation Date/Time: 12/06/2022 7:39 AM  Performed by: Camillia Herter, CRNAPre-anesthesia Checklist: Patient identified, Emergency Drugs available, Suction available and Patient being monitored Patient Re-evaluated:Patient Re-evaluated prior to induction Oxygen Delivery Method: Circle System Utilized Preoxygenation: Pre-oxygenation with 100% oxygen Induction Type: IV induction and Rapid sequence Laryngoscope Size: Miller and 2 Grade View: Grade I Tube type: Oral Tube size: 7.5 mm Number of attempts: 1 Airway Equipment and Method: Stylet and Oral airway Placement Confirmation: ETT inserted through vocal cords under direct vision, positive ETCO2 and breath sounds checked- equal and bilateral Tube secured with: Tape Dental Injury: Teeth and Oropharynx as per pre-operative assessment

## 2022-12-07 DIAGNOSIS — K922 Gastrointestinal hemorrhage, unspecified: Secondary | ICD-10-CM | POA: Diagnosis not present

## 2022-12-07 DIAGNOSIS — Z7902 Long term (current) use of antithrombotics/antiplatelets: Secondary | ICD-10-CM | POA: Diagnosis not present

## 2022-12-07 DIAGNOSIS — K31819 Angiodysplasia of stomach and duodenum without bleeding: Secondary | ICD-10-CM | POA: Diagnosis not present

## 2022-12-07 DIAGNOSIS — D649 Anemia, unspecified: Secondary | ICD-10-CM | POA: Diagnosis not present

## 2022-12-07 LAB — CBC
HCT: 26.7 % — ABNORMAL LOW (ref 39.0–52.0)
Hemoglobin: 8.7 g/dL — ABNORMAL LOW (ref 13.0–17.0)
MCH: 30 pg (ref 26.0–34.0)
MCHC: 32.6 g/dL (ref 30.0–36.0)
MCV: 92.1 fL (ref 80.0–100.0)
Platelets: 234 10*3/uL (ref 150–400)
RBC: 2.9 MIL/uL — ABNORMAL LOW (ref 4.22–5.81)
RDW: 16 % — ABNORMAL HIGH (ref 11.5–15.5)
WBC: 10.8 10*3/uL — ABNORMAL HIGH (ref 4.0–10.5)
nRBC: 0 % (ref 0.0–0.2)

## 2022-12-07 LAB — BASIC METABOLIC PANEL
Anion gap: 7 (ref 5–15)
BUN: 18 mg/dL (ref 8–23)
CO2: 22 mmol/L (ref 22–32)
Calcium: 8.5 mg/dL — ABNORMAL LOW (ref 8.9–10.3)
Chloride: 103 mmol/L (ref 98–111)
Creatinine, Ser: 1.58 mg/dL — ABNORMAL HIGH (ref 0.61–1.24)
GFR, Estimated: 42 mL/min — ABNORMAL LOW (ref >60)
Glucose, Bld: 112 mg/dL — ABNORMAL HIGH (ref 70–99)
Potassium: 3.7 mmol/L (ref 3.5–5.1)
Sodium: 132 mmol/L — ABNORMAL LOW (ref 135–145)

## 2022-12-07 LAB — GLUCOSE, CAPILLARY
Glucose-Capillary: 96 mg/dL (ref 70–99)
Glucose-Capillary: 108 mg/dL — ABNORMAL HIGH (ref 70–99)
Glucose-Capillary: 150 mg/dL — ABNORMAL HIGH (ref 70–99)
Glucose-Capillary: 98 mg/dL (ref 70–99)

## 2022-12-07 NOTE — Progress Notes (Signed)
IV team consulted for PIV access. Upon arriving to room, MD was updating patient and family. Family asked about the need for another PIV. MD stated that PIV access is not necessary at this time. RN notified.

## 2022-12-07 NOTE — Progress Notes (Signed)
PROGRESS NOTE  Justin [name] ZOX:[id number] DOB: [birth year] DOA: 12/04/2022 [facility]CP: [facility]   LOS: 3 days   Brief Narrative / Interim history: 88 year old male with chronic wounds, HTN, HLD, peripheral vascular disease, CKD comes into the hospital with anemia.  Routine labs were drawn in his living facility and was found to have a hemoglobin of 6.4.  Fecal occult was positive  Subjective / 24h Interval events: No abdominal pain, no nausea or vomiting  Assesement and Plan: Principal problem Anemia, presumed due to blood loss -possible that he has a slow GI bleed.  Hemoglobin of 6.4 but he was asymptomatic.  Fecal occult was positive.  Gastroenterology consulted, appreciate input. -Continue PPI.  Hold home Plavix -Underwent an EGD 8/29 which showed 7 nonbleeding angiectasia in the stomach status post APC and conditional MR clips placed.  There was 1 single angiectasia in the duodenum status post APC as well.   -Continue to monitor, appreciate GI follow-up  Active problems CKD 3B -baseline creatinine 1.6-2, at baseline today  Hyperlipidemia-continue Lipitor  Hypertension-continue Imdur  History of COPD-continue inhalers, respiratory status stable and at baseline  PAD, left toe ulcers -left first and third toes, superficial.  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   Lab Results  Component Value Date   HGBA1C 6.2 (H) 11/07/2022   CBG (last 3)  Recent Labs    12/06/22 1736 12/06/22 2009 12/07/22 0825  GLUCAP 259* 264* 96    Scheduled Meds:  atorvastatin  40 mg Oral QHS   divalproex  125 mg Oral BID   ferrous gluconate  324 mg Oral Q breakfast   fluticasone  furoate-vilanterol  1 puff Inhalation Daily   gabapentin  100 mg Oral BID   hydrOXYzine  10 mg Oral BID   insulin aspart  0-9 Units Subcutaneous TID WC   insulin glargine-yfgn  14 Units Subcutaneous Daily   isosorbide mononitrate  10 mg Oral QHS   lipase/protease/amylase  24,000 Units Oral TID WC   loratadine  10 mg Oral Daily   pantoprazole (PROTONIX) IV  40 mg Intravenous Q12H   sodium chloride flush  3 mL Intravenous Q12H   Continuous Infusions:  sodium chloride Stopped (12/05/22 1243)   PRN Meds:.acetaminophen **OR** acetaminophen, albuterol, hydrALAZINE, HYDROcodone-acetaminophen, ipratropium, ipratropium-albuterol, morphine injection, ondansetron **OR** ondansetron (ZOFRAN) IV, traZODone  Current Outpatient Medications  Medication Instructions   acetaminophen (TYLENOL) 650 mg, Oral, Every morning   Alcohol Swabs PADS Use to check blood sugar 3 times daily. Dx: E10.9   aspirin 81 mg, Oral, Daily   atorvastatin (LIPITOR) 40 mg, Oral, Daily at bedtime   Blood Glucose Calibration (ACCU-CHEK AVIVA) SOLN Use to check blood sugar 3 times daily. Dx: E10.9   Blood Glucose Monitoring Suppl (ACCU-CHEK AVIVA PLUS) w/Device KIT Use to check blood sugar 3 times daily. Dx: E10.9   BREO ELLIPTA 100-25 MCG/ACT AEPB 1 puff, Inhalation, Daily   cefdinir (OMNICEF) 300 mg, Oral, Every evening   clopidogrel (PLAVIX) 75 mg, Oral, Daily with breakfast   [employer] SALINE NOSE SPRAY NA 2 sprays, Nasal, 2 times daily   divalproex (DEPAKOTE SPRINKLE) 125 mg, Oral, 2 times daily   docusate sodium (COLACE) 100 mg, Oral, 2 times daily   Dulaglutide 1.5 mg, Subcutaneous, Weekly, tuesday   ferrous  gluconate (FERGON) 324 mg, Oral, Daily   gabapentin (NEURONTIN) 100 mg, Oral, 2 times daily   glucagon 1 mg, Intramuscular, Once PRN   glucose blood (ACCU-CHEK AVIVA PLUS) test strip Use to check blood sugar 3 times daily. Dx: E10.9   hydrOXYzine (ATARAX) 10 mg, Oral, 2 times daily, hold for sedation   Insulin Pen  Needle 32G X 4 MM MISC Use as directed to inject insulin tidwc   Ipratropium-Albuterol (COMBIVENT) 20-100 MCG/ACT AERS respimat 1 puff, Inhalation, Every 6 hours PRN   isosorbide mononitrate (ISMO) 10 mg, Oral, Daily at bedtime   Lancets (ACCU-CHEK SOFT TOUCH) lancets Use to check blood sugar 3 times daily. Dx: E10.9   loratadine (CLARITIN) 10 mg, Oral, Daily   NovoLOG FlexPen 0-8 Units, Subcutaneous, See admin instructions, Inject 0-8 units into the skin before lunch and supper, per sliding scale: if is BS 70-200 give 0 units, if BS is 201-250 give 2 units, if BS is 251-300 give 4 units, if BS is 301-350 give 6 units, if BS is 351-400 give 8 units. If blood sugar is greater than 400, call MD.    oxycodone (OXY-IR) 5 mg, Oral, Every 8 hours PRN   oxymetazoline (AFRIN) 0.05 % nasal spray 2 sprays, Each Nare, 2 times daily PRN   Pancrelipase, Lip-Prot-Amyl, (CREON) 24000-76000 units CPEP 1 capsule, Oral, 3 times daily, With 4-6 ounces of water   Pollen Extracts (PROSTAT PO) 30 mLs, Oral, 3 times daily   repaglinide (PRANDIN) 0.5 mg, Oral, 2 times daily before meals   Skin Protectants, Misc. (MINERIN CREME EX) 1 Application, Apply externally, Daily, Both legs and feet.   Spacer/Aero-Holding Rudean Curt Use as directed with combivent. Ok to dispense any spacer that works with the inhaler being rxed to pt.   Toujeo Max SoloStar 19 Units, Subcutaneous, Daily    Diet Orders (From admission, onward)     Start     Ordered   12/06/22 0916  Diet full liquid Fluid consistency: Thin  Diet effective now       Question:  Fluid consistency:  Answer:  Thin   12/06/22 0915            DVT prophylaxis: SCDs Start: 12/05/22 0012   Lab Results  Component Value Date   PLT 234 12/07/2022      Code Status: Limited: Do not attempt resuscitation (DNR) -DNR-LIMITED -Do Not Intubate/DNI   Family Communication: Daughter present at bedside  Status is: Inpatient Remains inpatient appropriate because:  severity of illness  Level of care: Telemetry Medical  Consultants:  GI  Objective: Vitals:   12/06/22 2008 12/07/22 0413 12/07/22 0752 12/07/22 0822  BP: 123/65 124/62 (!) 115/56   Pulse: 80 66 73 73  Resp: 17  16 16   Temp: 98.6 F (37 C) 97.7 F (36.5 C) 98.2 F (36.8 C)   TempSrc: Oral Oral Oral   SpO2: 100% 100% 95% 95%  Weight:      Height:        Intake/Output Summary (Last 24 hours) at 12/07/2022 1041 Last data filed at 12/06/2022 1500 Gross per 24 hour  Intake 240 ml  Output --  Net 240 ml   Wt Readings from Last 3 Encounters:  12/06/22 63.5 kg  11/08/22 63.6 kg  09/11/22 70.3 kg    Examination:  Constitutional: NAD Eyes: lids and conjunctivae normal, no scleral icterus ENMT: mmm Neck: normal, supple Respiratory: clear to auscultation bilaterally, no wheezing, no crackles.  Cardiovascular: Regular rate and rhythm, no  murmurs / rubs / gallops. No LE edema. Abdomen: soft, no distention, no tenderness. Bowel sounds positive.  Skin: no rashes  Data Reviewed: I have independently reviewed following labs and imaging studies   CBC Recent Labs  Lab 12/04/22 1658 12/04/22 2013 12/04/22 2358 12/05/22 1450 12/05/22 1816 12/06/22 0516 12/07/22 0815  WBC 7.4 7.9 8.0 7.2 9.1 8.5 10.8*  HGB 7.8* 7.3* 6.6* 8.8* 10.0* 8.8* 8.7*  HCT 24.8* 23.6* 20.6* 26.6* 29.4* 26.3* 26.7*  PLT 269 260 261 235 211 238 234  MCV 99.6 100.4* 95.4 90.2 91.3 89.8 92.1  MCH 31.3 31.1 30.6 29.8 31.1 30.0 30.0  MCHC 31.5 30.9 32.0 33.1 34.0 33.5 32.6  RDW 14.9 14.9 14.8 16.5* 16.5* 16.7* 16.0*  LYMPHSABS 3.0 3.1  --   --   --   --   --   MONOABS 0.7 0.8  --   --   --   --   --   EOSABS 0.9* 0.8*  --   --   --   --   --   BASOSABS 0.1 0.1  --   --   --   --   --     Recent Labs  Lab 12/04/22 1658 12/05/22 1450 12/06/22 0516 12/07/22 0815  NA 134* 132* 134* 132*  K 4.0 3.8 3.8 3.7  CL 102 106 104 103  CO2 22 21* 23 22  GLUCOSE 136* 95 117* 112*  BUN 28* 21 18 18    CREATININE 1.81* 1.39* 1.46* 1.58*  CALCIUM 8.5* 7.9* 8.5* 8.5*  AST 21  --  18  --   ALT 13  --  11  --   ALKPHOS 55  --  47  --   BILITOT 0.9  --  1.0  --   ALBUMIN 3.0*  --  2.6*  --   MG  --   --  1.8  --   INR 1.0  --   --   --     ------------------------------------------------------------------------------------------------------------------ No results for input(s): "CHOL", "HDL", "LDLCALC", "TRIG", "CHOLHDL", "LDLDIRECT" in the last 72 hours.  Lab Results  Component Value Date   HGBA1C 6.2 (H) 11/07/2022   ------------------------------------------------------------------------------------------------------------------ No results for input(s): "TSH", "T4TOTAL", "T3FREE", "THYROIDAB" in the last 72 hours.  Invalid input(s): "FREET3"  Cardiac Enzymes No results for input(s): "CKMB", "TROPONINI", "MYOGLOBIN" in the last 168 hours.  Invalid input(s): "CK" ------------------------------------------------------------------------------------------------------------------    Component Value Date/Time   BNP 55.7 05/18/2013 1528    CBG: Recent Labs  Lab 12/06/22 0702 12/06/22 1131 12/06/22 1736 12/06/22 2009 12/07/22 0825  GLUCAP 92 120* 259* 264* 96    No results found for this or any previous visit (from the past 240 hour(s)).   Radiology Studies: No results found.   [name], MD, PhD [facility]  Between 7 am - 7 pm I am available, please contact me via Amion (for emergencies) or Securechat (non urgent messages)  Between 7 pm - 7 am I am not available, please contact night coverage MD/APP via Amion

## 2022-12-07 NOTE — Progress Notes (Addendum)
Attending physician's note   I have taken a history, reviewed the chart, and examined the patient. I performed a substantive portion of this encounter, including complete performance of at least one of the key components, in conjunction with the APP. I agree with the APP's note, impression, and recommendations with my edits.   On my evaluation this afternoon, patient was found down on the floor.  States that he was sitting in his chair and slowly slid down onto the floor.  Did not hit his head.  Mental status appeared to be at baseline and was otherwise HD stable.  Was helped back to the bed with nursing staff.  Otherwise, H/H stable.  No overt bleeding.  Inpatient GI service will sign off at this time.  Please do not hesitate to contact us with additional questions or concerns  Norvell Ureste, DO, FACG [phone] office          Progress Note  Primary GI: Dr. Leone Payor  LOS: 3 days   Chief Complaint: Acute on chronic anemia   Subjective   RN notes one episode of black tarry stool this morning. Patient is tolerating full liquids without difficulty. Denies abdominal pain, nausea, and vomiting. Sitting in chair comfortably during exam.   Objective   Vital signs in last 24 hours: Temp:  [97.6 F (36.4 C)-98.6 F (37 C)] 98.2 F (36.8 C) (08/30 0752) Pulse Rate:  [66-80] 73 (08/30 0822) Resp:  [16-18] 16 (08/30 0822) BP: (115-140)/(56-69) 115/56 (08/30 0752) SpO2:  [95 %-100 %] 95 % (08/30 0822) Last BM Date : 12/07/22 Last BM recorded by nurses in past 5 days Stool Type: Type 7 (Liquid consistency with no solid pieces) (12/07/2022  3:00 AM)  General:   male in no acute distress  Heart:  Regular rate and rhythm; no murmurs Pulm: Clear anteriorly; no wheezing Abdomen: soft, nondistended, normal bowel sounds in all quadrants. Nontender without guarding. No organomegaly appreciated. Extremities:  No edema Neurologic:  Alert and  oriented x4;  No focal deficits.   Psych:  Cooperative. Normal mood and affect.  Intake/Output from previous day: 08/29 0701 - 08/30 0700 In: 440 [P.O.:240; I.V.:200] Out: -  Intake/Output this shift: No intake/output data recorded.  Studies/Results: CT ABDOMEN PELVIS W CONTRAST  Result Date: 12/05/2022 CLINICAL DATA:  Acute on chronic anemia, concern for occult malignancy EXAM: CT ABDOMEN AND PELVIS WITH CONTRAST TECHNIQUE: Multidetector CT imaging of the abdomen and pelvis was performed using the standard protocol following bolus administration of intravenous contrast. RADIATION DOSE REDUCTION: This exam was performed according to the departmental dose-optimization program which includes automated exposure control, adjustment of the mA and/or kV according to patient size and/or use of iterative reconstruction technique. CONTRAST:  75mL OMNIPAQUE IOHEXOL 350 MG/ML SOLN COMPARISON:  01/03/2013, 05/21/2021 FINDINGS: Lower chest: Linear area of consolidation of the left lateral lung base, favor atelectasis or scarring over acute airspace disease. No effusion or pneumothorax. Hepatobiliary: Numerous punctate parenchymal calcifications are again seen within the posterior aspect right lobe liver, increased since 2014. There is no associated mass lesion. No other focal parenchymal liver abnormalities. Gallbladder is surgically absent. No biliary duct dilation. Pancreas: Unremarkable. No pancreatic ductal dilatation or surrounding inflammatory changes. Spleen: Normal in size without focal abnormality. Adrenals/Urinary Tract: Numerous vascular calcifications are seen at the renal hila. No evidence of urinary tract calculi or obstructive uropathy. The adrenals are unremarkable. Bladder is only minimally distended, with nonspecific bladder wall thickening. No filling defects. Stomach/Bowel: There is a  large amount of retained stool within the rectal vault consistent with fecal impaction. No associated wall thickening to suggest stercoral colitis.  Significant fecal retention throughout the remainder of the colon consistent with constipation. No bowel obstruction or ileus. Normal appendix right lower quadrant. No acute inflammatory changes. Small hiatal hernia. Vascular/Lymphatic: Aortic atherosclerosis. No enlarged abdominal or pelvic lymph nodes. Reproductive: The prostate is not enlarged. Other: No free fluid or free intraperitoneal gas. No abdominal wall hernia. Musculoskeletal: Bones are diffusely osteopenic. Chronic L1 and L3 compression deformities are again noted. No acute bony abnormalities. Reconstructed images demonstrate no additional findings. IMPRESSION: 1. Large amount of retained stool throughout the colon, most pronounced within the rectal vault. Findings are consistent with constipation and superimposed fecal impaction. No evidence of stercoral colitis. 2. Otherwise no acute intra-abdominal or intrapelvic process. 3. Increased punctate parenchymal calcifications within the posterior right lobe liver, without associated mass lesion. These are of uncertain etiology, but likely benign process given longstanding presence within the liver. 4.  Aortic Atherosclerosis (ICD10-I70.0). Electronically Signed   By: Sharlet Salina M.D.   On: 12/05/2022 21:39    Lab Results: Recent Labs    12/05/22 1816 12/06/22 0516 12/07/22 0815  WBC 9.1 8.5 10.8*  HGB 10.0* 8.8* 8.7*  HCT 29.4* 26.3* 26.7*  PLT 211 238 234   BMET Recent Labs    12/05/22 1450 12/06/22 0516 12/07/22 0815  NA 132* 134* 132*  K 3.8 3.8 3.7  CL 106 104 103  CO2 21* 23 22  GLUCOSE 95 117* 112*  BUN 21 18 18   CREATININE 1.39* 1.46* 1.58*  CALCIUM 7.9* 8.5* 8.5*   LFT Recent Labs    12/06/22 0516  PROT 6.9  ALBUMIN 2.6*  AST 18  ALT 11  ALKPHOS 47  BILITOT 1.0   PT/INR Recent Labs    12/04/22 1658  LABPROT 13.7  INR 1.0     Scheduled Meds:  atorvastatin  40 mg Oral QHS   divalproex  125 mg Oral BID   ferrous gluconate  324 mg Oral Q breakfast    fluticasone furoate-vilanterol  1 puff Inhalation Daily   gabapentin  100 mg Oral BID   hydrOXYzine  10 mg Oral BID   insulin aspart  0-9 Units Subcutaneous TID WC   insulin glargine-yfgn  14 Units Subcutaneous Daily   isosorbide mononitrate  10 mg Oral QHS   lipase/protease/amylase  24,000 Units Oral TID WC   loratadine  10 mg Oral Daily   pantoprazole (PROTONIX) IV  40 mg Intravenous Q12H   sodium chloride flush  3 mL Intravenous Q12H   Continuous Infusions:  sodium chloride Stopped (12/05/22 1243)     Impression:   88 y.o. male with past medical history significant for diabetes, hypertension, IDA, CVA (on Plavix last dose presumably 8/27 AM), presents for evaluation of acute on chronic anemia. (baseline around 8, it was 6.8 on presentation), FOBT positive, constipation, last colonoscopy in 2015 with 2 diminutive polyps.   Acute on chronic anemia Hgb 8.7, MCV 92.1, stable Iron 50, TIBC 311, saturation 16% Ferritin 31 Folate 11.8 BUN 18 (28) FOBT positive EGD 12/04/2022 - Nonobstructing S-ring, 2 cm hiatal hernia, 7 nonbleeding angiodysplasias treated with APC and clips.  Angioectasia in duodenum treated with APC.  No specimens collected One black tarry stool this morning, likely residual.  EGD yesterday with multiple angioectasias treated with APC and clips.  Hemoglobin remained stable today.  Improvement in BUN is reassuring  CKD Near baseline  COPD   Diabetes   Plan:   -Continue daily CBC and transfuse as needed to maintain HGB > 7  -PPI IV twice daily -Continue to hold Plavix for now - Continue full liquids - Continue to monitor for bleeding.  If persistent bleeding and/or drop in Hgb will consider VCE+/-colonoscopy.  Overall stable at this time and closely monitoring -Continued bowel regimen to prevent constipation (scheduled MiraLAX)  Bayley M McMichael  12/07/2022, 12:12 PM

## 2022-12-07 NOTE — Care Management Important Message (Signed)
Important Message  Patient Details  Name: Justin [name] MRN: [id number] Date of Birth: July 30, 1934   Medicare Important Message Given:  Yes     Sherilyn Banker 12/07/2022, 3:13 PM

## 2022-12-07 NOTE — Plan of Care (Signed)
  Problem: Clinical Measurements: Goal: Cardiovascular complication will be avoided Outcome: Progressing   Problem: Activity: Goal: Risk for activity intolerance will decrease Outcome: Progressing   Problem: Nutrition: Goal: Adequate nutrition will be maintained Outcome: Progressing   Problem: Pain Managment: Goal: General experience of comfort will improve Outcome: Progressing   Problem: Safety: Goal: Ability to remain free from injury will improve Outcome: Progressing   

## 2022-12-07 NOTE — Progress Notes (Signed)
   12/07/22 1543  What Happened  Was fall witnessed? No  Was patient injured? No  Patient found on floor;other (Comment);in hallway (Appeared that he had)  Found by Staff-comment  Stated prior activity  (MD and RN walked into the room)  Provider Notification  Provider Name/Title [name], MD  Date Provider Notified 12/07/22  Time Provider Notified 1520  Method of Notification  (Messaged)  Notification Reason Fall  Provider response No new orders  Date of Provider Response 12/07/22  Time of Provider Response 1521  Follow Up  Family notified Yes - comment Justin [name] daughter)  Time family notified 1520  Additional tests No  Adult Fall Risk Assessment  Risk Factor Category (scoring not indicated) Fall has occurred during this admission (document High fall risk)  Age 88  Fall History: Fall within 6 months prior to admission 0  Elimination; Bowel and/or Urine Incontinence 2  Elimination; Bowel and/or Urine Urgency/Frequency 2  Medications: includes PCA/Opiates, Anti-convulsants, Anti-hypertensives, Diuretics, Hypnotics, Laxatives, Sedatives, and Psychotropics 5  Patient Care Equipment 1  Mobility-Assistance 2  Mobility-Gait 2  Mobility-Sensory Deficit 0  Altered awareness of immediate physical environment 0  Impulsiveness 2  Lack of understanding of one's physical/cognitive limitations 4  Total Score 23  Patient Fall Risk Level High fall risk  Adult Fall Risk Interventions  Required Bundle Interventions *See Row Information* High fall risk - low, moderate, and high requirements implemented  Additional Interventions Use of appropriate toileting equipment (bedpan, BSC, etc.)  Fall intervention(s) refused/Patient educated regarding refusal Nonskid socks  Screening for Fall Injury Risk (To be completed on HIGH fall risk patients) - Assessing Need for Floor Mats  Risk For Fall Injury- Criteria for Floor Mats 85 years or older  Will Implement Floor Mats Yes  Vitals  Temp  (!) 97.5 F (36.4 C)  Temp Source Axillary  Pulse Rate 88  Oxygen Therapy  SpO2 100 %  O2 Device Room Air  Pain Assessment  Pain Scale 0-10  Pain Score 0  PCA/Epidural/Spinal Assessment  Respiratory Pattern Regular;Unlabored  Neurological  Neuro (WDL) X  Level of Consciousness Alert  Orientation Level Oriented to person;Oriented to place;Oriented to situation  Cognition Follows commands;Appropriate attention/concentration  Speech Clear  R Pupil Shape Round  R Pupil Reaction Brisk  L Pupil Shape Round  L Pupil Reaction Brisk  Neuro Symptoms Forgetful  Musculoskeletal  Musculoskeletal (WDL) X  Assistive Device BSC  Generalized Weakness Yes  Musculoskeletal Details  Right Toes Amputated toes  Left Toes Amputated toes  Integumentary  Integumentary (WDL) X  Skin Color Appropriate for ethnicity  Skin Condition Dry  Skin Turgor Non-tenting  Patient described sliding down in the chair to the floor so that he could get his clothes and shoes.  He stated, "I was going to hitch a ride over to the assisted living to see my wife".  Patient denied falling, and stated, "I slide down out of the chair to get my clothes and shoes". Daughter Justin [name] notified.

## 2022-12-08 DIAGNOSIS — K922 Gastrointestinal hemorrhage, unspecified: Secondary | ICD-10-CM | POA: Diagnosis not present

## 2022-12-08 LAB — BASIC METABOLIC PANEL
Anion gap: 9 (ref 5–15)
BUN: 15 mg/dL (ref 8–23)
CO2: 17 mmol/L — ABNORMAL LOW (ref 22–32)
Calcium: 8.4 mg/dL — ABNORMAL LOW (ref 8.9–10.3)
Chloride: 108 mmol/L (ref 98–111)
Creatinine, Ser: 1.61 mg/dL — ABNORMAL HIGH (ref 0.61–1.24)
GFR, Estimated: 41 mL/min — ABNORMAL LOW (ref >60)
Glucose, Bld: 75 mg/dL (ref 70–99)
Potassium: 4.5 mmol/L (ref 3.5–5.1)
Sodium: 134 mmol/L — ABNORMAL LOW (ref 135–145)

## 2022-12-08 LAB — GLUCOSE, CAPILLARY
Glucose-Capillary: 99 mg/dL (ref 70–99)
Glucose-Capillary: 167 mg/dL — ABNORMAL HIGH (ref 70–99)
Glucose-Capillary: 99 mg/dL (ref 70–99)

## 2022-12-08 LAB — CBC
HCT: 28.3 % — ABNORMAL LOW (ref 39.0–52.0)
Hemoglobin: 8.9 g/dL — ABNORMAL LOW (ref 13.0–17.0)
MCH: 29.5 pg (ref 26.0–34.0)
MCHC: 31.4 g/dL (ref 30.0–36.0)
MCV: 93.7 fL (ref 80.0–100.0)
Platelets: 233 10*3/uL (ref 150–400)
RBC: 3.02 MIL/uL — ABNORMAL LOW (ref 4.22–5.81)
RDW: 16 % — ABNORMAL HIGH (ref 11.5–15.5)
WBC: 10 10*3/uL (ref 4.0–10.5)
nRBC: 0 % (ref 0.0–0.2)

## 2022-12-08 LAB — MAGNESIUM: Magnesium: 1.6 mg/dL — ABNORMAL LOW (ref 1.7–2.4)

## 2022-12-08 MED ORDER — OXYCODONE HCL 5 MG PO CAPS: 5.0000 mg | ORAL_CAPSULE | Freq: Three times a day (TID) | ORAL | 0 refills | Status: DC | PRN

## 2022-12-08 MED ORDER — PANTOPRAZOLE SODIUM 40 MG PO TBEC: 40.0000 mg | DELAYED_RELEASE_TABLET | Freq: Every day | ORAL | Status: DC

## 2022-12-08 NOTE — Progress Notes (Signed)
AVS printed

## 2022-12-08 NOTE — Progress Notes (Signed)
[name] the daughter stated she will come pick up his walker tomorrow.

## 2022-12-08 NOTE — NC FL2 (Signed)
[place] MEDICAID FL2 LEVEL OF CARE FORM     IDENTIFICATION  Patient Name: Justin [name] Birthdate: 12-25-1934 Sex: male Admission Date (Current Location): 12/04/2022  [facility] and [place] Number:  Producer, television/film/video and Address:  [facility], [address] [place], [place] [address]      Provider Number: [id number]  Attending Physician Name and Address:  Leatha Gilding, MD  Relative Name and Phone Number:       Current Level of Care: Hospital Recommended Level of Care: Skilled Nursing Facility Prior Approval Number:    Date Approved/Denied:   PASRR Number: [id number]  Discharge Plan: SNF    Current Diagnoses: Patient Active Problem List   Diagnosis Date Noted   AVM (arteriovenous malformation) of small bowel, acquired with hemorrhage 12/06/2022   Hiatal hernia 12/06/2022   Lower esophageal ring ([place]) 12/06/2022   Acute on chronic anemia 12/05/2022   Heme positive stool 12/05/2022   Long term (current) use of antithrombotics/antiplatelets 12/05/2022   GI bleed 12/04/2022   Toxic metabolic encephalopathy 11/07/2022   Acute metabolic encephalopathy 11/06/2022   UTI (urinary tract infection) 11/06/2022   Wound of lower extremity 11/06/2022   GERD without esophagitis 01/26/2022   Osteomyelitis of second toe of right foot ([name]) 01/25/2022   Acute-on-chronic kidney injury ([name])    Altered mental status    PAD (peripheral artery disease) ([name]) 12/09/2021   Balanitis 12/09/2021   Pressure injury of skin 11/25/2021   Diabetic foot ([name]) 11/16/2021   Diabetic foot infection ([name]) 11/15/2021   Intertrigo 11/15/2021   History of CVA (cerebrovascular accident) 05/21/2021   Chronic kidney disease, stage 3b ([name]) 05/21/2021   Mixed diabetic hyperlipidemia associated with type 2 diabetes mellitus ([name]) 05/21/2021   Polyneuropathy due to type 2 diabetes mellitus ([name]) 05/21/2021   Leukocytosis 05/21/2021   Type 2 diabetes mellitus with  stage 3b chronic kidney disease, with long-term current use of insulin ([name]) 09/29/2015   Arthritis 09/06/2015   Diabetic peripheral neuropathy ([name]) 08/17/2015   OA (osteoarthritis) of knee 08/17/2015   Primary osteoarthritis of both knees 08/11/2015   Primary osteoarthritis of left hip 08/11/2015   Hammer toe, acquired 09/30/2013   Personal history of colonic adenomas 04/15/2013   Chronic pancreatitis ([name]) 02/26/2013   Anemia of chronic disease 02/26/2013   Goals of care, counseling/discussion 02/26/2013   Essential hypertension 12/14/2012   Testosterone deficiency 10/22/2011   Headache above the eye region 02/22/2011   Coronary artery disease 02/22/2011    Orientation RESPIRATION BLADDER Height & Weight     Self  Normal Incontinent Weight: 140 lb (63.5 kg) Height:  6\' 2"  (188 cm)  BEHAVIORAL SYMPTOMS/MOOD NEUROLOGICAL BOWEL NUTRITION STATUS      Incontinent Diet (See dc summary)  AMBULATORY STATUS COMMUNICATION OF NEEDS Skin   Extensive Assist Verbally Normal                       Personal Care Assistance Level of Assistance  Bathing, Feeding, Dressing Bathing Assistance: Maximum assistance Feeding assistance: Maximum assistance Dressing Assistance: Maximum assistance     Functional Limitations Info  Hearing   Hearing Info: Impaired      SPECIAL CARE FACTORS FREQUENCY                       Contractures Contractures Info: Not present    Additional Factors Info  Code Status, Allergies Code Status Info: DNR Allergies Info: NKA  Current Medications (12/08/2022):  This is the current hospital active medication list Current Facility-Administered Medications  Medication Dose Route Frequency Provider Last Rate Last Admin   acetaminophen (TYLENOL) tablet 650 mg  650 mg Oral Q6H PRN Cirigliano, Vito V, DO   650 mg at 12/05/22 9604   Or   acetaminophen (TYLENOL) suppository 650 mg  650 mg Rectal Q6H PRN Cirigliano, Vito V, DO       albuterol  (PROVENTIL) (2.5 MG/3ML) 0.083% nebulizer solution 2.5 mg  2.5 mg Nebulization Q6H PRN Cirigliano, Vito V, DO       atorvastatin (LIPITOR) tablet 40 mg  40 mg Oral QHS Cirigliano, Vito V, DO   40 mg at 12/07/22 2156   divalproex (DEPAKOTE SPRINKLE) capsule 125 mg  125 mg Oral BID Cirigliano, Vito V, DO   125 mg at 12/08/22 5409   ferrous gluconate (FERGON) tablet 324 mg  324 mg Oral Q breakfast Cirigliano, Vito V, DO   324 mg at 12/08/22 0922   fluticasone furoate-vilanterol (BREO ELLIPTA) 100-25 MCG/ACT 1 puff  1 puff Inhalation Daily Cirigliano, Vito V, DO   1 puff at 12/08/22 0753   gabapentin (NEURONTIN) capsule 100 mg  100 mg Oral BID Cirigliano, Vito V, DO   100 mg at 12/08/22 8119   hydrALAZINE (APRESOLINE) injection 5 mg  5 mg Intravenous Q8H PRN Cirigliano, Vito V, DO       HYDROcodone-acetaminophen (NORCO/VICODIN) 5-325 MG per tablet 1-2 tablet  1-2 tablet Oral Q4H PRN Cirigliano, Vito V, DO       hydrOXYzine (ATARAX) tablet 10 mg  10 mg Oral BID Cirigliano, Vito V, DO   10 mg at 12/08/22 1478   insulin aspart (novoLOG) injection 0-9 Units  0-9 Units Subcutaneous TID WC Cirigliano, Vito V, DO   1 Units at 12/07/22 1712   insulin glargine-yfgn (SEMGLEE) injection 14 Units  14 Units Subcutaneous Daily Cirigliano, Vito V, DO   14 Units at 12/08/22 0922   ipratropium (ATROVENT) nebulizer solution 0.5 mg  0.5 mg Nebulization Q6H PRN Cirigliano, Vito V, DO       ipratropium-albuterol (DUONEB) 0.5-2.5 (3) MG/3ML nebulizer solution 3 mL  3 mL Inhalation Q6H PRN Cirigliano, Vito V, DO       isosorbide mononitrate (ISMO) tablet 10 mg  10 mg Oral QHS Cirigliano, Vito V, DO   10 mg at 12/07/22 2156   lipase/protease/amylase (CREON) capsule 24,000 Units  24,000 Units Oral TID WC Cirigliano, Vito V, DO   24,000 Units at 12/08/22 2956   loratadine (CLARITIN) tablet 10 mg  10 mg Oral Daily Cirigliano, Vito V, DO   10 mg at 12/08/22 2130   morphine (PF) 2 MG/ML injection 1 mg  1 mg Intravenous Q6H PRN  Cirigliano, Vito V, DO       ondansetron (ZOFRAN) tablet 4 mg  4 mg Oral Q6H PRN Cirigliano, Vito V, DO       Or   ondansetron (ZOFRAN) injection 4 mg  4 mg Intravenous Q6H PRN Cirigliano, Vito V, DO       pantoprazole (PROTONIX) injection 40 mg  40 mg Intravenous Q12H Cirigliano, Vito V, DO   40 mg at 12/08/22 0923   sodium chloride flush (NS) 0.9 % injection 3 mL  3 mL Intravenous Q12H Cirigliano, Vito V, DO   3 mL at 12/07/22 2200   traZODone (DESYREL) tablet 25 mg  25 mg Oral QHS PRN Cirigliano, Vito V, DO   25 mg at 12/05/22 2231  Discharge Medications: Please see discharge summary for a list of discharge medications.  Relevant Imaging Results:  Relevant Lab Results:   Additional Information SS#: [id number]  Mearl Latin, LCSW

## 2022-12-08 NOTE — Progress Notes (Signed)
Attempted to call report to [place]. No response from the nurse only Diplomatic Services operational officer. Did call again and no pick up.

## 2022-12-08 NOTE — TOC Transition Note (Signed)
Transition of Care [facility]) - CM/SW Discharge Note   Patient Details  Name: Justin [name] MRN: [id number] Date of Birth: 26-Oct-1934  Transition of Care [facility]) CM/SW Contact:  Mearl Latin, LCSW Phone Number: 12/08/2022, 12:54 PM   Clinical Narrative:    Patient will DC to: [facility] Anticipated DC date: 12/08/22 Family notified: Son, Debby Bud Transport by: Sharin Mons   Per MD patient ready for DC to [place]. RN to call report prior to discharge [phone] room 903A). RN, patient, patient's family, and facility notified of DC. Discharge Summary and FL2 sent to facility. DC packet on chart including signed DNR and script. Ambulance transport requested for patient.   CSW will sign off for now as social work intervention is no longer needed. Please consult [place] again if new needs arise.     Final next level of care: Skilled Nursing Facility Barriers to Discharge: Barriers Resolved   Patient Goals and CMS Choice CMS Medicare.gov Compare Post Acute Care list provided to:: Patient Represenative (must comment) Choice offered to / list presented to : Adult Children  Discharge Placement     Existing PASRR number confirmed : 12/08/22          Patient [facility] at[facility] Patient to be transferred to facility by: [facility] Name of family member notified: Son Patient and family notified of of transfer: 12/08/22  Discharge Plan and Services Additional resources added to the After Visit Summary for       Post Acute Care Choice: Skilled Nursing Facility                               Social Determinants of Health (SDOH) Interventions SDOH Screenings   Food Insecurity: No Food Insecurity (12/05/2022)  Housing: Patient Unable To Answer (12/05/2022)  Transportation Needs: No Transportation Needs (12/05/2022)  Utilities: Not At Risk (12/05/2022)  Depression (PHQ2-9): Low Risk  (02/13/2022)  Tobacco Use: Medium Risk (12/06/2022)     Readmission Risk Interventions     01/26/2022   11:24 AM  Readmission Risk Prevention Plan  Transportation Screening Complete  PCP or Specialist Appt within 3-5 Days Complete  HRI or Home Care Consult Complete  Social Work Consult for Recovery Care Planning/Counseling Complete  Palliative Care Screening Not Applicable  Medication Review Oceanographer) Complete

## 2022-12-08 NOTE — Discharge Summary (Signed)
Physician Discharge Summary  Justin [name] ZOX:[id number] DOB: 1934/04/26 DOA: 12/04/2022  PCP: [facility]  Admit date: 12/04/2022 Discharge date: 12/08/2022  Admitted From: SNF Disposition:  SNF  Recommendations for Outpatient Follow-up:  Follow up with PCP in 1-2 weeks Resume Aspirin in 1 day  Home Health: none Equipment/Devices: none  Discharge Condition: stable CODE STATUS: DNR  HPI: Per admitting MD, Justin [name] is a 88 y.o. male with a known history of arthritis, CKD, diabetes with chronic wounds, hypertension, hyperlipidemia, iron deficiency, CVA presents to the emergency department for evaluation of anemia.  Patient had routine labs drawn at his living facility today and was found to have a hemoglobin of 6.4.  Patient denies all complaints - states that he feels fine. Patient denies fevers/chills, weakness, dizziness, chest pain, shortness of breath, N/V/C/D, abdominal pain, dysuria/frequency, changes in mental status.   Hospital Course / Discharge diagnoses: Principal Problem:   GI bleed Active Problems:   Acute on chronic anemia   Heme positive stool   Long term (current) use of antithrombotics/antiplatelets   AVM (arteriovenous malformation) of small bowel, acquired with hemorrhage   Hiatal hernia   Lower esophageal ring ([place])   Principal problem Anemia, presumed due to blood loss -possible that he has a slow GI bleed.  Hemoglobin of 6.4 but he was asymptomatic.  Fecal occult was positive.  Gastroenterology consulted, underwent an EGD 8/29 which showed 7 nonbleeding angiectasia in the stomach status post APC and conditional MR clips placed.  There was 1 single angiectasia in the duodenum status post APC as well.  He was monitored following the EGD, hemoglobin has remained stable and clinically he is without further bleeding   Active problems CKD 3B -baseline creatinine 1.6-2, at baseline today Hyperlipidemia-continue  Lipitor Hypertension-continue Imdur History of COPD-continue inhalers, respiratory status stable and at baseline PAD, left toe ulcers -left first and third toes, superficial.  Seen by podiatry as well as vascular surgery as an outpatient. August 2023 had right popliteal drug-coated angioplasty, right tibioperoneal trunk angioplasty and right peroneal artery angioplasty. 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 Follow up with Vascular surgery as an outpatient. Case briefly discussed with Dr Lenell Antu Diabetes mellitus-continue home regimen  Sepsis ruled out   Discharge Instructions   Allergies as of 12/08/2022   No Known Allergies      Medication List     STOP taking these medications    clopidogrel 75 MG tablet Commonly known as: PLAVIX       TAKE these medications    Accu-Chek Aviva Plus w/Device Kit Use to check blood sugar 3 times daily. Dx: E10.9   Accu-Chek Aviva Soln Use to check blood sugar 3 times daily. Dx: E10.9   accu-chek soft touch lancets Use to check blood sugar 3 times daily. Dx: E10.9   acetaminophen 325 MG tablet Commonly known as: TYLENOL Take 650 mg by mouth in the morning.   Alcohol Swabs Pads Use to check blood sugar 3 times daily. Dx: E10.9   aspirin 81 MG chewable tablet Chew 81 mg by mouth daily.   atorvastatin 40 MG tablet Commonly known as: LIPITOR Take 40 mg by mouth at bedtime.   Breo Ellipta 100-25 MCG/ACT Aepb Generic drug: fluticasone furoate-vilanterol Inhale 1 puff into the lungs daily.   cefdinir 300 MG capsule Commonly known as: OMNICEF Take 1 capsule (300 mg total) by mouth every evening.   Creon 24000-76000 units Cpep Generic drug: Pancrelipase (Lip-Prot-Amyl) Take 1 capsule by  mouth 3 (three) times daily. With 4-6 ounces of water   [employer] SALINE NOSE SPRAY NA Place 2 sprays into the nose 2 (two) times daily.   divalproex 125 MG capsule Commonly known as: DEPAKOTE SPRINKLE Take 125 mg by  mouth 2 (two) times daily.   docusate sodium 100 MG capsule Commonly known as: COLACE Take 100 mg by mouth 2 (two) times daily.   Dulaglutide 1.5 MG/0.5ML Sopn Inject 1.5 mg into the skin once a week. tuesday   ferrous gluconate 324 MG tablet Commonly known as: FERGON Take 324 mg by mouth daily.   gabapentin 100 MG capsule Commonly known as: NEURONTIN Take 100 mg by mouth 2 (two) times daily.   glucagon 1 MG injection Inject 1 mg into the muscle once as needed. What changed: reasons to take this   glucose blood test strip Commonly known as: Accu-Chek Aviva Plus Use to check blood sugar 3 times daily. Dx: E10.9   hydrOXYzine 10 MG tablet Commonly known as: ATARAX Take 10 mg by mouth 2 (two) times daily. hold for sedation   Insulin Pen Needle 32G X 4 MM Misc Use as directed to inject insulin tidwc   Ipratropium-Albuterol 20-100 MCG/ACT Aers respimat Commonly known as: COMBIVENT Inhale 1 puff into the lungs every 6 (six) hours as needed for wheezing. What changed: reasons to take this   isosorbide mononitrate 10 MG tablet Commonly known as: ISMO Take 10 mg by mouth at bedtime.   loratadine 10 MG tablet Commonly known as: CLARITIN Take 10 mg by mouth daily.   MINERIN CREME EX Apply 1 Application topically daily. Both legs and feet.   NovoLOG FlexPen 100 UNIT/ML FlexPen Generic drug: insulin aspart Inject 0-8 Units into the skin See admin instructions. Inject 0-8 units into the skin before lunch and supper, per sliding scale: if is BS 70-200 give 0 units, if BS is 201-250 give 2 units, if BS is 251-300 give 4 units, if BS is 301-350 give 6 units, if BS is 351-400 give 8 units. If blood sugar is greater than 400, call MD.   oxycodone 5 MG capsule Commonly known as: OXY-IR Take 1 capsule (5 mg total) by mouth every 8 (eight) hours as needed for pain.   oxymetazoline 0.05 % nasal spray Commonly known as: AFRIN Place 2 sprays into both nostrils 2 (two) times daily as  needed for congestion.   pantoprazole 40 MG tablet Commonly known as: Protonix Take 1 tablet (40 mg total) by mouth daily.   PROSTAT PO Take 30 mLs by mouth 3 (three) times daily.   repaglinide 0.5 MG tablet Commonly known as: PRANDIN Take 0.5 mg by mouth 2 (two) times daily before a meal.   Spacer/Aero-Holding [employer] Use as directed with combivent. Ok to dispense any spacer that works with the inhaler being rxed to pt.   Toujeo Max SoloStar 300 UNIT/ML Solostar Pen Generic drug: insulin glargine (2 Unit Dial) Inject 19 Units into the skin daily.        Contact information for after-discharge care     Destination     [facility] Preferred SNF .   Service: Skilled Nursing Contact information: [address] [facility] [place] [address] [phone]                     Consultations: GI  Procedures/Studies:  CT ABDOMEN PELVIS W CONTRAST  Result Date: 12/05/2022 CLINICAL DATA:  Acute on chronic anemia, concern for occult malignancy  EXAM: CT ABDOMEN AND PELVIS WITH CONTRAST TECHNIQUE: Multidetector CT imaging of the abdomen and pelvis was performed using the standard protocol following bolus administration of intravenous contrast. RADIATION DOSE REDUCTION: This exam was performed according to the departmental dose-optimization program which includes automated exposure control, adjustment of the mA and/or kV according to patient size and/or use of iterative reconstruction technique. CONTRAST:  75mL OMNIPAQUE IOHEXOL 350 MG/ML SOLN COMPARISON:  01/03/2013, 05/21/2021 FINDINGS: Lower chest: Linear area of consolidation of the left lateral lung base, favor atelectasis or scarring over acute airspace disease. No effusion or pneumothorax. Hepatobiliary: Numerous punctate parenchymal calcifications are again seen within the posterior aspect right lobe liver, increased since 2014. There is no associated mass lesion. No other focal  parenchymal liver abnormalities. Gallbladder is surgically absent. No biliary duct dilation. Pancreas: Unremarkable. No pancreatic ductal dilatation or surrounding inflammatory changes. Spleen: Normal in size without focal abnormality. Adrenals/Urinary Tract: Numerous vascular calcifications are seen at the renal hila. No evidence of urinary tract calculi or obstructive uropathy. The adrenals are unremarkable. Bladder is only minimally distended, with nonspecific bladder wall thickening. No filling defects. Stomach/Bowel: There is a large amount of retained stool within the rectal vault consistent with fecal impaction. No associated wall thickening to suggest stercoral colitis. Significant fecal retention throughout the remainder of the colon consistent with constipation. No bowel obstruction or ileus. Normal appendix right lower quadrant. No acute inflammatory changes. Small hiatal hernia. Vascular/Lymphatic: Aortic atherosclerosis. No enlarged abdominal or pelvic lymph nodes. Reproductive: The prostate is not enlarged. Other: No free fluid or free intraperitoneal gas. No abdominal wall hernia. Musculoskeletal: Bones are diffusely osteopenic. Chronic L1 and L3 compression deformities are again noted. No acute bony abnormalities. Reconstructed images demonstrate no additional findings. IMPRESSION: 1. Large amount of retained stool throughout the colon, most pronounced within the rectal vault. Findings are consistent with constipation and superimposed fecal impaction. No evidence of stercoral colitis. 2. Otherwise no acute intra-abdominal or intrapelvic process. 3. Increased punctate parenchymal calcifications within the posterior right lobe liver, without associated mass lesion. These are of uncertain etiology, but likely benign process given longstanding presence within the liver. 4.  Aortic Atherosclerosis (ICD10-I70.0). Electronically Signed   By: Sharlet Salina M.D.   On: 12/05/2022 21:39   VAS [place] LOWER  EXTREMITY VENOUS (DVT)  Result Date: 12/05/2022  Lower Venous DVT Study Patient Name:  Justin [name]  Date of Exam:   12/05/2022 Medical Rec #: [id number]         Accession #:    [id number] Date of Birth: 1934/10/30         Patient Gender: M Patient Age:   3 years Exam Location:  [facility] Procedure:      VAS [place] LOWER EXTREMITY VENOUS (DVT) Referring Phys: [name] --------------------------------------------------------------------------------  Indications: BLEV for left leg pain.  Comparison Study: No previous exams Performing Technologist: Jody Hill RVT, RDMS  Examination Guidelines: A complete evaluation includes B-mode imaging, spectral Doppler, color Doppler, and power Doppler as needed of all accessible portions of each vessel. Bilateral testing is considered an integral part of a complete examination. Limited examinations for reoccurring indications may be performed as noted. The reflux portion of the exam is performed with the patient in reverse Trendelenburg.  +---------+---------------+---------+-----------+----------+-------------------+ RIGHT    CompressibilityPhasicitySpontaneityPropertiesThrombus Aging      +---------+---------------+---------+-----------+----------+-------------------+ CFV      Full           Yes      Yes                                      +---------+---------------+---------+-----------+----------+-------------------+  SFJ      Full                                                             +---------+---------------+---------+-----------+----------+-------------------+ FV Prox  Full           Yes      Yes                                      +---------+---------------+---------+-----------+----------+-------------------+ FV Mid   Full           Yes      Yes                                      +---------+---------------+---------+-----------+----------+-------------------+ FV DistalFull           Yes      Yes                                       +---------+---------------+---------+-----------+----------+-------------------+ PFV      Full                                                             +---------+---------------+---------+-----------+----------+-------------------+ POP      Partial        Yes      Yes                                      +---------+---------------+---------+-----------+----------+-------------------+ PTV      Full                                                             +---------+---------------+---------+-----------+----------+-------------------+ PERO                                                  Not well visualized +---------+---------------+---------+-----------+----------+-------------------+   +---------+---------------+---------+-----------+----------+--------------+ LEFT     CompressibilityPhasicitySpontaneityPropertiesThrombus Aging +---------+---------------+---------+-----------+----------+--------------+ CFV      Full           Yes      Yes                                 +---------+---------------+---------+-----------+----------+--------------+ SFJ      Full                                                        +---------+---------------+---------+-----------+----------+--------------+  FV Prox  Full           Yes      Yes                                 +---------+---------------+---------+-----------+----------+--------------+ FV Mid   Full           Yes      Yes                                 +---------+---------------+---------+-----------+----------+--------------+ FV DistalFull           Yes      Yes                                 +---------+---------------+---------+-----------+----------+--------------+ PFV      Full                                                        +---------+---------------+---------+-----------+----------+--------------+ POP      Full           Yes      Yes                                  +---------+---------------+---------+-----------+----------+--------------+ PTV      Full                                                        +---------+---------------+---------+-----------+----------+--------------+ PERO     Full                                                        +---------+---------------+---------+-----------+----------+--------------+     Summary: BILATERAL: - No evidence of deep vein thrombosis seen in the lower extremities, bilaterally. -No evidence of popliteal cyst, bilaterally.   *See table(s) above for measurements and observations. Electronically signed by Sherald Hess MD on 12/05/2022 at 1:16:04 PM.    Final      Subjective: - no chest pain, shortness of breath, no abdominal pain, nausea or vomiting.   Discharge Exam: BP 136/67 (BP Location: Right Arm)   Pulse 78   Temp (!) 97.4 F (36.3 C) (Oral)   Resp 16   Ht 6\' 2"  (1.88 m)   Wt 63.5 kg   SpO2 100%   BMI 17.97 kg/m   General: Pt is alert, awake, not in acute distress Cardiovascular: RRR, S1/S2 +, no rubs, no gallops Respiratory: CTA bilaterally, no wheezing, no rhonchi Abdominal: Soft, NT, ND, bowel sounds + Extremities: no edema, no cyanosis    The results of significant diagnostics from this hospitalization (including imaging, microbiology, ancillary and laboratory) are listed below for reference.     Microbiology: No results found for this or any previous visit (from the past  240 hour(s)).   Labs: Basic Metabolic Panel: Recent Labs  Lab 12/04/22 1658 12/05/22 1450 12/06/22 0516 12/07/22 0815 12/08/22 0219  NA 134* 132* 134* 132* 134*  K 4.0 3.8 3.8 3.7 4.5  CL 102 106 104 103 108  CO2 22 21* 23 22 17*  GLUCOSE 136* 95 117* 112* 75  BUN 28* 21 18 18 15   CREATININE 1.81* 1.39* 1.46* 1.58* 1.61*  CALCIUM 8.5* 7.9* 8.5* 8.5* 8.4*  MG  --   --  1.8  --  1.6*   Liver Function Tests: Recent Labs  Lab 12/04/22 1658 12/06/22 0516  AST 21  18  ALT 13 11  ALKPHOS 55 47  BILITOT 0.9 1.0  PROT 7.7 6.9  ALBUMIN 3.0* 2.6*   CBC: Recent Labs  Lab 12/04/22 1658 12/04/22 2013 12/04/22 2358 12/05/22 1450 12/05/22 1816 12/06/22 0516 12/07/22 0815 12/08/22 0219  WBC 7.4 7.9   < > 7.2 9.1 8.5 10.8* 10.0  NEUTROABS 2.7 3.0  --   --   --   --   --   --   HGB 7.8* 7.3*   < > 8.8* 10.0* 8.8* 8.7* 8.9*  HCT 24.8* 23.6*   < > 26.6* 29.4* 26.3* 26.7* 28.3*  MCV 99.6 100.4*   < > 90.2 91.3 89.8 92.1 93.7  PLT 269 260   < > 235 211 238 234 233   < > = values in this interval not displayed.   CBG: Recent Labs  Lab 12/07/22 0825 12/07/22 1146 12/07/22 1647 12/07/22 2131 12/08/22 0747  GLUCAP 96 108* 150* 98 99   Hgb A1c No results for input(s): "HGBA1C" in the last 72 hours. Lipid Profile No results for input(s): "CHOL", "HDL", "LDLCALC", "TRIG", "CHOLHDL", "LDLDIRECT" in the last 72 hours. Thyroid function studies No results for input(s): "TSH", "T4TOTAL", "T3FREE", "THYROIDAB" in the last 72 hours.  Invalid input(s): "FREET3" Urinalysis    Component Value Date/Time   COLORURINE YELLOW 11/06/2022 1833   APPEARANCEUR TURBID (A) 11/06/2022 1833   LABSPEC 1.013 11/06/2022 1833   PHURINE 7.0 11/06/2022 1833   GLUCOSEU NEGATIVE 11/06/2022 1833   HGBUR SMALL (A) 11/06/2022 1833   BILIRUBINUR NEGATIVE 11/06/2022 1833   BILIRUBINUR negative 09/27/2015 1629   BILIRUBINUR neg 07/01/2012 0902   KETONESUR NEGATIVE 11/06/2022 1833   PROTEINUR >=300 (A) 11/06/2022 1833   UROBILINOGEN 4.0 09/27/2015 1629   NITRITE NEGATIVE 11/06/2022 1833   LEUKOCYTESUR MODERATE (A) 11/06/2022 1833    FURTHER DISCHARGE INSTRUCTIONS:   Get Medicines reviewed and adjusted: Please take all your medications with you for your next visit with your Primary MD   Laboratory/radiological data: Please request your Primary MD to go over all hospital tests and procedure/radiological results at the follow up, please ask your Primary MD to get all  Hospital records sent to his/her office.   In some cases, they will be blood work, cultures and biopsy results pending at the time of your discharge. Please request that your primary care M.D. goes through all the records of your hospital data and follows up on these results.   Also Note the following: If you experience worsening of your admission symptoms, develop shortness of breath, life threatening emergency, suicidal or homicidal thoughts you must seek medical attention immediately by calling 911 or calling your MD immediately  if symptoms less severe.   You must read complete instructions/literature along with all the possible adverse reactions/side effects for all the Medicines you take and that have been prescribed to you.  Take any new Medicines after you have completely understood and accpet all the possible adverse reactions/side effects.    Do not drive when taking Pain medications or sleeping medications (Benzodaizepines)   Do not take more than prescribed Pain, Sleep and Anxiety Medications. It is not advisable to combine anxiety,sleep and pain medications without talking with your primary care practitioner   Special Instructions: If you have smoked or chewed Tobacco  in the last 2 yrs please stop smoking, stop any regular Alcohol  and or any Recreational drug use.   Wear Seat belts while driving.   Please note: You were cared for by a hospitalist during your hospital stay. Once you are discharged, your primary care physician will handle any further medical issues. Please note that NO REFILLS for any discharge medications will be authorized once you are discharged, as it is imperative that you return to your primary care physician (or establish a relationship with a primary care physician if you do not have one) for your post hospital discharge needs so that they can reassess your need for medications and monitor your lab values.  Time coordinating discharge: 35  minutes  SIGNED:  [name], MD, PhD 12/08/2022, 10:35 AM

## 2022-12-08 NOTE — TOC Transition Note (Signed)
Transition of Care [facility]) - CM/SW Discharge Note   Patient Details  Name: Justin [name] MRN: [id number] Date of Birth: Jan 12, 1935  Transition of [facility]) CM/SW Contact:  Mearl Latin, LCSW Phone Number: 12/08/2022, 9:55 AM   Clinical Narrative:    CSW left voicemail for patient's daughter. CSW spoke with patient's son and he is aware of patient's discharge today and in agreement with [facility] for transport. CSW made [facility] aware. Will confirm DNR is on chart.    Final next level of care: Skilled Nursing Facility Barriers to Discharge: Barriers Resolved   Patient Goals and CMS Choice CMS Medicare.gov Compare Post Acute Care list provided to:: Patient Represenative (must comment) Choice offered to / list presented to : Adult Children  Discharge Placement     Existing PASRR number confirmed : 12/08/22          Patient chooses bed at: [facility] Patient to be transferred to facility by: [facility] Name of family member notified: Son Patient and family notified of of transfer: 12/08/22  Discharge Plan and Services Additional resources added to the After Visit Summary for       Post Acute Care Choice: Skilled Nursing Facility                               Social Determinants of Health (SDOH) Interventions SDOH Screenings   Food Insecurity: No Food Insecurity (12/05/2022)  Housing: Patient Unable To Answer (12/05/2022)  Transportation Needs: No Transportation Needs (12/05/2022)  Utilities: Not At Risk (12/05/2022)  Depression (PHQ2-9): Low Risk  (02/13/2022)  Tobacco Use: Medium Risk (12/06/2022)     Readmission Risk Interventions    01/26/2022   11:24 AM  Readmission Risk Prevention Plan  Transportation Screening Complete  PCP or Specialist Appt within 3-5 Days Complete  HRI or Home Care Consult Complete  Social Work Consult for Recovery Care Planning/Counseling Complete  Palliative Care Screening Not Applicable  Medication Review Oceanographer) Complete

## 2022-12-10 ENCOUNTER — Encounter (HOSPITAL_COMMUNITY): Payer: Self-pay | Admitting: Gastroenterology

## 2022-12-12 ENCOUNTER — Encounter (INDEPENDENT_AMBULATORY_CARE_PROVIDER_SITE_OTHER): Payer: Medicare Other | Admitting: Ophthalmology

## 2022-12-12 DIAGNOSIS — H3581 Retinal edema: Secondary | ICD-10-CM

## 2022-12-12 NOTE — Consult Note (Signed)
   [facility]  [facility] Inpatient Consult   12/12/2022  Justin [name] 07-30-1934 [id number]    Primary Care Provider:  [facility] was checked and is currently not in a [facility] Customer service manager for affiliated provider  Medicare ACO REACH  The patient is not on the current member enrollment rosters for any of the [employer] risk contracted plans with an affiliated Nature conservation officer.     Reason:  Not a beneficiary currently attributed to one of the [facility] ACO Registry populations.   Patient's primary care provider is not an in-network provider with [facility] at this time. Patient went to [facility] will alert RN [facility] regarding eligibility status for post facility follow up.  For questions, please call:  Charlesetta Shanks, RN BSN CCM [facility]  [phone] business mobile phone Toll free office [phone]  Fax number: [phone] [name]

## 2022-12-13 ENCOUNTER — Other Ambulatory Visit: Payer: Self-pay | Admitting: *Deleted

## 2022-12-13 NOTE — Patient Outreach (Signed)
Mr. Personius  resides in [facility] skilled nursing facility. Screening for potential care coordination services as benefit of health plan and Primary Care Provider.   Collaboration with social work team.  Lynnell Catalan, [place] social worker reports Mr. Ditter is a long term care resident.  No identifiable care coordination needs at this time.    Raiford Noble, MSN, RN, BSN [place]  [facility] RN Post- Acute Care Coordinator Direct Dial: [phone]

## 2022-12-27 ENCOUNTER — Ambulatory Visit (INDEPENDENT_AMBULATORY_CARE_PROVIDER_SITE_OTHER): Payer: Medicare Other | Admitting: Neurology

## 2022-12-27 VITALS — BP 159/70 | HR 73

## 2022-12-27 DIAGNOSIS — E782 Mixed hyperlipidemia: Secondary | ICD-10-CM | POA: Diagnosis not present

## 2022-12-27 DIAGNOSIS — F0150 Vascular dementia without behavioral disturbance: Secondary | ICD-10-CM | POA: Diagnosis not present

## 2022-12-27 DIAGNOSIS — E1140 Type 2 diabetes mellitus with diabetic neuropathy, unspecified: Secondary | ICD-10-CM

## 2022-12-27 DIAGNOSIS — Z8673 Personal history of transient ischemic attack (TIA), and cerebral infarction without residual deficits: Secondary | ICD-10-CM | POA: Diagnosis not present

## 2022-12-27 DIAGNOSIS — Z794 Long term (current) use of insulin: Secondary | ICD-10-CM

## 2022-12-27 DIAGNOSIS — R413 Other amnesia: Secondary | ICD-10-CM

## 2022-12-27 NOTE — Patient Instructions (Signed)
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    Continue physical and occupational therapies.  Check dementia panel labs, lipid profile, hemoglobin A1c and MRI scan of the brain.  I called the patient's daughter and left a message for her to call me to discuss treatment options including Namenda or cholinesterase inhibitors.  Return for follow-up in the future in 6 months or call earlier if necessary.

## 2022-12-27 NOTE — Progress Notes (Signed)
[facility] [address] [place]. [place] [address] [phone]       OFFICE FOLLOW-UP NOTE  [name] Date of Birth:  06-19-1934 Medical Record Number:  [id number]   HPI:  Update 12/27/2022 : Patient is referred back for evaluation today by Justin [name] nurse practitioner for dementia.  Patient is unable to provide any history.  Patient's daughter Justin [name] was not available and left message on her voicemail to call me to discuss her concerns.  Patient apparently has been living in [facility] skilled nursing facility for quite some time.  He did have mild memory impairment daughter has been concerned as per his caregiver is accompanying him today about progressive cognitive worsening and memory loss particular in the last few months.  Patient is pleasant and cooperative but disoriented and not able to remember a lot of things do things for himself.  He has not been ambulating a lot due to bilateral foot and wearing boots.  He walks minimally with help with the therapist using a walker.  He has not exhibited significant delusions, estimations of violent behavior.  He has not had any recent falls or injuries or loss of consciousness.  There has been no witnessed seizure activity or new strokelike events.  He was hospitalized recently from 12/04/2022 to 12/08/2022 with anemia and required blood transfusions for his chronic iron deficiency anemia.  Review of electronic medical records show that he has a CT scan of the head on 11/06/2022 which showed changes of small vessel disease and mild degree of generalized cerebral atrophy.  Vitamin B12 levels on 8/24/436 mg percent.  It is unclear to me if there is family history of Alzheimer's or not.  Patient had prior history of left hemispheric infarct in February 2023 and residual mild right hemiparesis from that which apparently seems to have improved.  He had exhibited memory difficulties during the last visit but full Mini-Mental status  exam was not done at that visit. Initial visit 07/27/2021 [name] is a 88 year old African-American male seen today for initial office visit following hospital admission for stroke in February 2023.  History is obtained from the patient, review of electronic medical records and opossum reviewed pertinent available imaging films in PACS.  He has past medical history of diabetes, hypertension, hyperlipidemia, arthritis, iron deficiency anemia, acute pancreatitis in 2014, prior history of stroke and TIA who presented to the emergency room via EMS from [facility] and rehab nursing home where he was staying on 05/21/2021 with sudden onset of right facial droop, right upper extremity plegia and right lower extremity weakness.  He was last known well the night prior and was outside time window for thrombolysis.  CT scan of the head showed old bilateral cerebellar infarcts and changes of small vessel disease and atrophy.  CT angiogram of the head and neck was attempted but patient was quite restless and hence could not be completed.  Carotid ultrasound showed bilateral 1-39% stenosis.  2D echo showed ejection fraction 60 to 65% without cardiac source of embolism.  LDL cholesterol is 85 mg percent.  Hemoglobin A1c was 7.6.  MRI of the brain showed no intracranial large vessel stenosis or occlusion.  Patient was started on aspirin Plavix for 3 weeks followed by aspirin alone.  He was sent back to [facility] and rehab center for ongoing therapy needs.  Patient states he is recovered his speech and facial weakness but right hand weakness persists.  His leg weakness is also better.  Is able to ambulate but has not brought his cane or walker today.  He is tolerating aspirin well without bruising or bleeding.  Blood pressure in good control.  He states his sugars are also doing fine.  He has had no recurrent stroke or TIA symptoms.  ROS:   14 system review of systems is positive for hand weakness, difficulty walking,  slurred speech poor balance fall risk all other systems negative  PMH:  Past Medical History:  Diagnosis Date   Acute metabolic encephalopathy 05/24/2021   Arthritis    Diabetes mellitus    HTN (hypertension)    Hypercholesteremia    Iron deficiency    Pancreatitis, acute 06/02/2012   Diagnosed 05/2012    Personal history of colonic adenomas 04/15/2013   04/15/2013 2 diminutive polyps     Stroke ([name])    TIA (transient ischemic attack)     Social History:  Social History   Socioeconomic History   Marital status: Married    Spouse name: Not on file   Number of children: 6   Years of education: Not on file   Highest education level: Not on file  Occupational History   Not on file  Tobacco Use   Smoking status: Former    Current packs/day: 0.00    Types: Cigarettes    Start date: 04/10/1955    Quit date: 04/09/1990    Years since quitting: 32.7   Smokeless tobacco: Never  Substance and Sexual Activity   Alcohol use: No    Comment: "per pt stopped drinking in the 90's"   Drug use: No   Sexual activity: Not on file  Other Topics Concern   Not on file  Social History Narrative   Not on file   Social Determinants of Health   Financial Resource Strain: Not on file  Food Insecurity: No Food Insecurity (12/05/2022)   Hunger Vital Sign    Worried About Running Out of Food in the Last Year: Never true    Ran Out of Food in the Last Year: Never true  Transportation Needs: No Transportation Needs (12/05/2022)   PRAPARE - Administrator, Civil Service (Medical): No    Lack of Transportation (Non-Medical): No  Physical Activity: Not on file  Stress: Not on file  Social Connections: Not on file  Intimate Partner Violence: Not At Risk (12/05/2022)   Humiliation, Afraid, Rape, and Kick questionnaire    Fear of Current or Ex-Partner: No    Emotionally Abused: No    Physically Abused: No    Sexually Abused: No    Medications:   Current Outpatient Medications on File  Prior to Visit  Medication Sig Dispense Refill   acetaminophen (TYLENOL) 325 MG tablet Take 650 mg by mouth in the morning.     Alcohol Swabs PADS Use to check blood sugar 3 times daily. Dx: E10.9 300 each 3   aspirin 81 MG chewable tablet Chew 81 mg by mouth daily.     atorvastatin (LIPITOR) 40 MG tablet Take 40 mg by mouth at bedtime.     Blood Glucose Calibration (ACCU-CHEK AVIVA) SOLN Use to check blood sugar 3 times daily. Dx: E10.9 1 each 3   Blood Glucose Monitoring Suppl (ACCU-CHEK AVIVA PLUS) w/Device KIT Use to check blood sugar 3 times daily. Dx: E10.9 1 kit 0   BREO ELLIPTA 100-25 MCG/ACT AEPB Inhale 1 puff into the lungs daily.     [employer] SALINE NOSE SPRAY NA Place 2 sprays into  the nose 2 (two) times daily.     divalproex (DEPAKOTE SPRINKLE) 125 MG capsule Take 125 mg by mouth 2 (two) times daily.     docusate sodium (COLACE) 100 MG capsule Take 100 mg by mouth 2 (two) times daily.     ferrous gluconate (FERGON) 324 MG tablet Take 324 mg by mouth daily.     gabapentin (NEURONTIN) 100 MG capsule Take 100 mg by mouth 2 (two) times daily.     glucagon (GLUCAGON EMERGENCY) 1 MG injection Inject 1 mg into the muscle once as needed. (Patient taking differently: Inject 1 mg into the muscle once as needed (for low blood sugar).) 1 each 12   glucose blood (ACCU-CHEK AVIVA PLUS) test strip Use to check blood sugar 3 times daily. Dx: E10.9 300 each 3   hydrOXYzine (ATARAX) 10 MG tablet Take 10 mg by mouth 2 (two) times daily. hold for sedation     insulin glargine, 2 Unit Dial, (TOUJEO MAX SOLOSTAR) 300 UNIT/ML Solostar Pen Inject 19 Units into the skin daily.     Insulin Pen Needle 32G X 4 MM MISC Use as directed to inject insulin tidwc 100 each 11   Ipratropium-Albuterol (COMBIVENT) 20-100 MCG/ACT AERS respimat Inhale 1 puff into the lungs every 6 (six) hours as needed for wheezing. (Patient taking differently: Inhale 1 puff into the lungs every 6 (six) hours as needed (for COPD).)      isosorbide mononitrate (ISMO) 10 MG tablet Take 10 mg by mouth at bedtime.     loratadine (CLARITIN) 10 MG tablet Take 10 mg by mouth daily.     NOVOLOG FLEXPEN 100 UNIT/ML FlexPen Inject 0-8 Units into the skin See admin instructions. Inject 0-8 units into the skin before lunch and supper, per sliding scale: if is BS 70-200 give 0 units, if BS is 201-250 give 2 units, if BS is 251-300 give 4 units, if BS is 301-350 give 6 units, if BS is 351-400 give 8 units. If blood sugar is greater than 400, call MD.     oxycodone (OXY-IR) 5 MG capsule Take 1 capsule (5 mg total) by mouth every 8 (eight) hours as needed for pain. 10 capsule 0   oxymetazoline (AFRIN) 0.05 % nasal spray Place 2 sprays into both nostrils 2 (two) times daily as needed for congestion.     Pancrelipase, Lip-Prot-Amyl, (CREON) 24000-76000 units CPEP Take 1 capsule by mouth 3 (three) times daily. With 4-6 ounces of water     pantoprazole (PROTONIX) 40 MG tablet Take 1 tablet (40 mg total) by mouth daily.     Pollen Extracts (PROSTAT PO) Take 30 mLs by mouth 3 (three) times daily.     repaglinide (PRANDIN) 0.5 MG tablet Take 0.5 mg by mouth 2 (two) times daily before a meal.     Skin Protectants, Misc. (MINERIN CREME EX) Apply 1 Application topically daily. Both legs and feet.     Spacer/Aero-Holding Rudean Curt Use as directed with combivent. Ok to dispense any spacer that works with the inhaler being rxed to pt. 1 each 1   cefdinir (OMNICEF) 300 MG capsule Take 1 capsule (300 mg total) by mouth every evening. (Patient not taking: Reported on 12/04/2022)     Dulaglutide 1.5 MG/0.5ML SOPN Inject 1.5 mg into the skin once a week. tuesday     Lancets (ACCU-CHEK SOFT TOUCH) lancets Use to check blood sugar 3 times daily. Dx: E10.9 (Patient not taking: Reported on 12/27/2022) 300 each 3   No current  facility-administered medications on file prior to visit.    Allergies:  No Known Allergies  Physical Exam General: Frail malnourished  looking elderly African-American male, seated, in no evident distress Head: head normocephalic and atraumatic.  Neck: supple with no carotid or supraclavicular bruits Cardiovascular: regular rate and rhythm, no murmurs Musculoskeletal: no deformity but bilateral foot boots for nonhealing wounds. Skin:  no rash/petichiae Vascular:  Normal pulses all extremities Vitals:   12/27/22 1332  BP: (!) 159/70  Pulse: 73   Neurologic Exam Mental Status: Awake and fully alert. Oriented to place and person only.. Recent and remote memory i poor. Attention span, concentration and fund of knowledge poor. Mood and affect appropriate.  Not fully cooperative for Mini-Mental but scored 15/28 as he is unable to read and write Cranial Nerves: Fundoscopic exam reveals sharp disc margins. Pupils equal, briskly reactive to light. Extraocular movements full without nystagmus. Visual fields full to confrontation. Hearing intact. Facial sensation intact. Right lower face weakness., tongue, palate moves normally and symmetrically.  Motor: Upper extremity 4/5 strength proximally and weakness of right grip and using hand muscles.  Orbits left over right upper extremity.  Right lower extremity strength is 4+/5.  Tone is increased on the right.  Normal strength on the left side.   Sensory.:  Diminished touch ,pinprick sensation in the right upper extremity.  Intact.position and vibratory sensation.  Coordination: Rapid alternating movements normal in all extremities. Finger-to-nose and heel-to-shin performed accurately bilaterally. Gait and Station: Deferred as patient is sitting in the wheelchair and did not bring his walker and has bilateral foot wounds and is wearing boots.   Reflexes: 2+ and asymmetric and brisker on the right.. Toes downgoing.   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  He has history of left frontopareital cortical and bilateral subcortical infarcts of cryptogenic etiology in Feb 2023 with mild residual right hemiparesis. Vascular risk factors of Diabetes, Hypertension , prior strokes and TIA and Hyperlipidimia.     PLAN:I  had a long d/w patient  and his caregiver about his remote, memory loss and likely mixed vascular and alzheimer`s dementia,, risk for recurrent stroke/TIAs, personally independently reviewed imaging studies and stroke evaluation results and answered questions.Continue aspirin 81 mg daily  for secondary stroke prevention and maintain strict control of hypertension with blood pressure goal below 130/90, diabetes with hemoglobin A1c goal below 6.5% and lipids with LDL cholesterol goal below 70 mg/dL.    Continue physical and occupational therapies.  Check dementia panel labs, lipid profile, hemoglobin A1c and MRI scan of the brain.  I called the patient's daughter and left a message for her to call me to discuss treatment options including Namenda or cholinesterase inhibitors.  Return for follow-up in the future in 6 months or call earlier if necessary..Greater than 50% of time during this 40 minute visit was spent on counseling,explanation of diagnosis of stroke. Mixed dementia,, planning of further management, discussion with patient and family  and coordination of care Delia Heady, MD Note: This document was prepared with digital dictation and possible smart phrase technology. Any transcriptional errors that result from this process are unintentional

## 2022-12-28 ENCOUNTER — Telehealth: Payer: Self-pay | Admitting: Neurology

## 2022-12-28 NOTE — Telephone Encounter (Signed)
medicare/medicaid NPR sent to [facility] [phone]

## 2023-01-17 ENCOUNTER — Ambulatory Visit (HOSPITAL_COMMUNITY)
Admission: RE | Admit: 2023-01-17 | Discharge: 2023-01-17 | Disposition: A | Discharge status: Home / Self Care | Payer: Medicare Other | Source: Ambulatory Visit | Attending: Neurology | Admitting: Neurology

## 2023-01-17 DIAGNOSIS — F0150 Vascular dementia without behavioral disturbance: Secondary | ICD-10-CM | POA: Diagnosis present

## 2023-01-17 MED ORDER — GADOBUTROL 1 MMOL/ML IV SOLN
6.0000 mL | Freq: Once | INTRAVENOUS | Status: AC | PRN
  Administered 2023-01-17: 6 mL via INTRAVENOUS

## 2023-01-22 ENCOUNTER — Ambulatory Visit (INDEPENDENT_AMBULATORY_CARE_PROVIDER_SITE_OTHER): Payer: Medicare Other | Admitting: Neurology

## 2023-01-22 DIAGNOSIS — R4182 Altered mental status, unspecified: Secondary | ICD-10-CM

## 2023-01-22 DIAGNOSIS — F0150 Vascular dementia without behavioral disturbance: Secondary | ICD-10-CM

## 2023-01-25 NOTE — Progress Notes (Signed)
Kindly inform the patient that EEG of brainwave study shows no seizure activity but slowing of the brainwave activity which is to be expected in conditions like memory loss and dementia.  Nothing to worry about

## 2023-01-30 ENCOUNTER — Telehealth: Payer: Self-pay

## 2023-01-30 NOTE — Telephone Encounter (Signed)
-----   Message from Delia Heady sent at 01/25/2023  9:17 AM EDT ----- Joneen Roach inform the patient that EEG of brainwave study shows no seizure activity but slowing of the brainwave activity which is to be expected in conditions like memory loss and dementia.  Nothing to worry about

## 2023-01-30 NOTE — Telephone Encounter (Signed)
Contacted pt son Royal Hawthorn, per DPR, LVM rq call back.

## 2023-01-30 NOTE — Telephone Encounter (Signed)
Pt's son returned call and I was able to review the results with him. Pt's son verbalized understanding and had no further questions.

## 2023-02-01 ENCOUNTER — Telehealth: Payer: Self-pay

## 2023-02-01 DIAGNOSIS — I739 Peripheral vascular disease, unspecified: Secondary | ICD-10-CM

## 2023-02-01 NOTE — Telephone Encounter (Signed)
Justin [name] at [facility] called stating that the facility's wound care provider rounded and discovered new diabetic ulcers on pt's L foot.  Reviewed pt's chart, returned call for clarification, two identifiers used. Dr. Lenell Antu had recommended that the pt f/u with podiatry and he hadn't seen him since 05/2022.  Justin [name] made appt for podiatry on 10/30. Used referral to schedule appts for ABI and PA. Informed Justin [name] of date and times. Confirmed understanding.

## 2023-02-06 ENCOUNTER — Ambulatory Visit (INDEPENDENT_AMBULATORY_CARE_PROVIDER_SITE_OTHER): Payer: Medicare Other | Admitting: Podiatry

## 2023-02-06 ENCOUNTER — Encounter: Payer: Self-pay | Admitting: Podiatry

## 2023-02-06 ENCOUNTER — Telehealth: Payer: Self-pay | Admitting: Podiatry

## 2023-02-06 DIAGNOSIS — L97521 Non-pressure chronic ulcer of other part of left foot limited to breakdown of skin: Secondary | ICD-10-CM

## 2023-02-06 DIAGNOSIS — N1832 Chronic kidney disease, stage 3b: Secondary | ICD-10-CM | POA: Diagnosis not present

## 2023-02-06 DIAGNOSIS — E1122 Type 2 diabetes mellitus with diabetic chronic kidney disease: Secondary | ICD-10-CM

## 2023-02-06 DIAGNOSIS — Z794 Long term (current) use of insulin: Secondary | ICD-10-CM | POA: Diagnosis not present

## 2023-02-06 MED ORDER — DOXYCYCLINE HYCLATE 100 MG PO TABS: 100.0000 mg | ORAL_TABLET | Freq: Two times a day (BID) | ORAL | 0 refills | Status: DC

## 2023-02-06 NOTE — Progress Notes (Signed)
Subjective:  Patient ID: Justin [name], male    DOB: [birth year],  MRN: [id number]  Chief Complaint  Patient presents with   Foot Pain    Left pain due to pressure sores family notified 1 week ago patient is diabetic.    88 y.o. male presents with the above complaint.  Patient presents with left fourth digit ulceration.  He states he started about a week ago patient is a dialysis patient.  He is also diabetic.  He has been seen in the wound care center he denies any other acute complaints.  Wanted to get it evaluated.   Review of Systems: Negative except as noted in the HPI. Denies N/V/F/Ch.  Past Medical History:  Diagnosis Date   Acute metabolic encephalopathy 05/24/2021   Arthritis    Diabetes mellitus    HTN (hypertension)    Hypercholesteremia    Iron deficiency    Pancreatitis, acute 06/02/2012   Diagnosed 05/2012    Personal history of colonic adenomas 04/15/2013   04/15/2013 2 diminutive polyps     Stroke ([name])    TIA (transient ischemic attack)     Current Outpatient Medications:    acetaminophen (TYLENOL) 325 MG tablet, Take 650 mg by mouth in the morning., Disp: , Rfl:    Alcohol Swabs PADS, Use to check blood sugar 3 times daily. Dx: E10.9, Disp: 300 each, Rfl: 3   aspirin 81 MG chewable tablet, Chew 81 mg by mouth daily., Disp: , Rfl:    atorvastatin (LIPITOR) 40 MG tablet, Take 40 mg by mouth at bedtime., Disp: , Rfl:    Blood Glucose Calibration (ACCU-CHEK AVIVA) SOLN, Use to check blood sugar 3 times daily. Dx: E10.9, Disp: 1 each, Rfl: 3   Blood Glucose Monitoring Suppl (ACCU-CHEK AVIVA PLUS) w/Device KIT, Use to check blood sugar 3 times daily. Dx: E10.9, Disp: 1 kit, Rfl: 0   BREO ELLIPTA 100-25 MCG/ACT AEPB, Inhale 1 puff into the lungs daily., Disp: , Rfl:    cefdinir (OMNICEF) 300 MG capsule, Take 1 capsule (300 mg total) by mouth every evening. (Patient not taking: Reported on 02/12/2023), Disp: , Rfl:    CVS SALINE NOSE SPRAY NA, Place 2 sprays into the nose  2 (two) times daily., Disp: , Rfl:    divalproex (DEPAKOTE SPRINKLE) 125 MG capsule, Take 125 mg by mouth 2 (two) times daily., Disp: , Rfl:    docusate sodium (COLACE) 100 MG capsule, Take 100 mg by mouth 2 (two) times daily., Disp: , Rfl:    doxycycline (VIBRA-TABS) 100 MG tablet, Take 1 tablet (100 mg total) by mouth 2 (two) times daily., Disp: 180 tablet, Rfl: 0   Dulaglutide 1.5 MG/0.5ML SOPN, Inject 1.5 mg into the skin once a week. tuesday, Disp: , Rfl:    ferrous gluconate (FERGON) 324 MG tablet, Take 324 mg by mouth daily., Disp: , Rfl:    gabapentin (NEURONTIN) 100 MG capsule, Take 100 mg by mouth 2 (two) times daily., Disp: , Rfl:    glucagon (GLUCAGON EMERGENCY) 1 MG injection, Inject 1 mg into the muscle once as needed. (Patient taking differently: Inject 1 mg into the muscle once as needed (for low blood sugar).), Disp: 1 each, Rfl: 12   glucose blood (ACCU-CHEK AVIVA PLUS) test strip, Use to check blood sugar 3 times daily. Dx: E10.9, Disp: 300 each, Rfl: 3   hydrOXYzine (ATARAX) 10 MG tablet, Take 10 mg by mouth 2 (two) times daily. hold for sedation, Disp: , Rfl:  insulin glargine, 2 Unit Dial, (TOUJEO MAX SOLOSTAR) 300 UNIT/ML Solostar Pen, Inject 19 Units into the skin daily., Disp: , Rfl:    Insulin Pen Needle 32G X 4 MM MISC, Use as directed to inject insulin tidwc, Disp: 100 each, Rfl: 11   Ipratropium-Albuterol (COMBIVENT) 20-100 MCG/ACT AERS respimat, Inhale 1 puff into the lungs every 6 (six) hours as needed for wheezing. (Patient taking differently: Inhale 1 puff into the lungs every 6 (six) hours as needed (for COPD).), Disp: , Rfl:    isosorbide mononitrate (ISMO) 10 MG tablet, Take 10 mg by mouth at bedtime., Disp: , Rfl:    Lancets (ACCU-CHEK SOFT TOUCH) lancets, Use to check blood sugar 3 times daily. Dx: E10.9, Disp: 300 each, Rfl: 3   loratadine (CLARITIN) 10 MG tablet, Take 10 mg by mouth daily., Disp: , Rfl:    NOVOLOG FLEXPEN 100 UNIT/ML FlexPen, Inject 0-8 Units  into the skin See admin instructions. Inject 0-8 units into the skin before lunch and supper, per sliding scale: if is BS 70-200 give 0 units, if BS is 201-250 give 2 units, if BS is 251-300 give 4 units, if BS is 301-350 give 6 units, if BS is 351-400 give 8 units. If blood sugar is greater than 400, call MD., Disp: , Rfl:    oxycodone (OXY-IR) 5 MG capsule, Take 1 capsule (5 mg total) by mouth every 8 (eight) hours as needed for pain., Disp: 10 capsule, Rfl: 0   oxymetazoline (AFRIN) 0.05 % nasal spray, Place 2 sprays into both nostrils 2 (two) times daily as needed for congestion., Disp: , Rfl:    Pancrelipase, Lip-Prot-Amyl, (CREON) 24000-76000 units CPEP, Take 1 capsule by mouth 3 (three) times daily. With 4-6 ounces of water, Disp: , Rfl:    pantoprazole (PROTONIX) 40 MG tablet, Take 1 tablet (40 mg total) by mouth daily., Disp: , Rfl:    Pollen Extracts (PROSTAT PO), Take 30 mLs by mouth 3 (three) times daily., Disp: , Rfl:    repaglinide (PRANDIN) 0.5 MG tablet, Take 0.5 mg by mouth 2 (two) times daily before a meal., Disp: , Rfl:    Skin Protectants, Misc. (MINERIN CREME EX), Apply 1 Application topically daily. Both legs and feet., Disp: , Rfl:    Spacer/Aero-Holding Chambers DEVI, Use as directed with combivent. Ok to dispense any spacer that works with the inhaler being rxed to pt., Disp: 1 each, Rfl: 1   sertraline (ZOLOFT) 25 MG tablet, Take 25 mg by mouth daily., Disp: , Rfl:   Social History   Tobacco Use  Smoking Status Former   Current packs/day: 0.00   Types: Cigarettes   Start date: 04/10/1955   Quit date: 04/09/1990   Years since quitting: 32.8  Smokeless Tobacco Never    No Known Allergies Objective:  There were no vitals filed for this visit. There is no height or weight on file to calculate BMI. Constitutional Well developed. Well nourished.  Vascular Dorsalis pedis pulses palpable bilaterally. Posterior tibial pulses palpable bilaterally. Capillary refill normal to  all digits.  No cyanosis or clubbing noted. Pedal hair growth normal.  Neurologic Normal speech. Oriented to person, place, and time. Epicritic sensation to light touch grossly present bilaterally.  Dermatologic Left fourth digit ulceration limited to the breakdown of skin does not probe down to deep bone no purulent drainage noted no exposure of bone noted.  Orthopedic: Normal joint ROM without pain or crepitus bilaterally. No visible deformities. No bony tenderness.   Radiographs: None  Assessment:   1. Toe ulcer, left, limited to breakdown of skin ([name])   2. Type 2 diabetes mellitus with stage 3b chronic kidney disease, with long-term current use of insulin ([name])    Plan:  Patient was evaluated and treated and all questions answered.  Left fourth digit ulcer limited to the breakdown of the skin -All questions and concerns were discussed with the patient extensive detail -At this time no gross signs of infection noted.  Will continue to clinically monitor.  Patient being followed at the wound care center and is scheduled to be seen by them I will defer further management to them.  If it regresses or become osteomyelitic we will discuss surgical amputation  No follow-ups on file.

## 2023-02-06 NOTE — Telephone Encounter (Signed)
This patient was seen earlier today and was sent home with medications to pick up from the pharmacy. Because he stays in an assisted living facility he will need to have the prescription order faxed to the actual facility so the in-house provider can authorize the medication. The order needs to be signed and dated as well as a start and end date for the medication with any restrictions etc... The fax number is [phone] Attention to [facility] The phone number [phone] for any questions   Thanks!

## 2023-02-07 ENCOUNTER — Telehealth: Payer: Self-pay | Admitting: Podiatry

## 2023-02-07 NOTE — Telephone Encounter (Signed)
Pt is in [facility] the RN called stating this pt was seen yesterday. Pt was given an antibiotic. They need a date to stop the antibiotic and they also need instructions about the boot he was given. [place] number is [phone]

## 2023-02-08 ENCOUNTER — Other Ambulatory Visit (HOSPITAL_COMMUNITY): Payer: Self-pay | Admitting: *Deleted

## 2023-02-11 ENCOUNTER — Encounter (HOSPITAL_COMMUNITY): Payer: Medicare Other

## 2023-02-11 ENCOUNTER — Encounter (HOSPITAL_COMMUNITY): Payer: Self-pay

## 2023-02-11 ENCOUNTER — Telehealth: Payer: Self-pay

## 2023-02-11 NOTE — Telephone Encounter (Signed)
Shanda Bumps from [facility] called and left a message. She still needs a stop date for the doxycycline and instructions for his boot -when should he have it on, is he supposed to be NWB, etc. Ph [phone] Fax [phone]

## 2023-02-12 ENCOUNTER — Ambulatory Visit (INDEPENDENT_AMBULATORY_CARE_PROVIDER_SITE_OTHER): Payer: Medicare Other | Admitting: Physician Assistant

## 2023-02-12 ENCOUNTER — Ambulatory Visit (HOSPITAL_COMMUNITY)
Admission: RE | Admit: 2023-02-12 | Discharge: 2023-02-12 | Disposition: A | Discharge status: Home / Self Care | Payer: Medicare Other | Source: Ambulatory Visit | Attending: Vascular Surgery | Admitting: Vascular Surgery

## 2023-02-12 VITALS — BP 153/73 | HR 86 | Temp 98.7°F

## 2023-02-12 DIAGNOSIS — I7025 Atherosclerosis of native arteries of other extremities with ulceration: Secondary | ICD-10-CM

## 2023-02-12 DIAGNOSIS — I739 Peripheral vascular disease, unspecified: Secondary | ICD-10-CM | POA: Diagnosis not present

## 2023-02-12 LAB — VAS US ABI WITH/WO TBI
Left ABI: 0.93
Right ABI: 1.09

## 2023-02-12 NOTE — Progress Notes (Unsigned)
Office Note     CC:  follow up Requesting Provider:  Lynnea Maizes, NP  HPI: Justin [name] is a 88 y.o. (06-13-1934) male who presents with his daughter for evaluation at request of RN at [facility] with concerns of new wounds on left foot. Pt has some mild dementia so most of history is per his daughter, Justin [name]. These have been present for 1 week per daughter. He was seen by Dr. Allena Katz who recommended local wound care. Per daughter she feels they are not doing what they were instructed at the facility per Dr. Allena Katz  He was last seen in our office in June and at that time he did have some small areas of superficial ulceration on the left foot. He was previously under management at the wound care center with Dr. Annamary Rummage. Daughter explains that these wounds healed.  He has history of right lower extremity Angiogram with stenting of the right popliteal artery and balloon angioplasty of the TPT and peroneal artery in August of 2023. He had to subsequently undergo TMA on 01/28/22 by Dr. Allena Katz. He has known severe pedal disease. He does not ambulate. He is in wheelchair. He is medically managed on Aspirin and statin.  He is medically managed on statin for hyperlipidemia He is medically managed on Aspirin. No other anticoagulation He is not on medication for Hypertension He is diabetic He is Former Smoker  Past Medical History:  Diagnosis Date   Acute metabolic encephalopathy 05/24/2021   Arthritis    Diabetes mellitus    HTN (hypertension)    Hypercholesteremia    Iron deficiency    Pancreatitis, acute 06/02/2012   Diagnosed 05/2012    Personal history of colonic adenomas 04/15/2013   04/15/2013 2 diminutive polyps     Stroke [facility])    TIA (transient ischemic attack)     Past Surgical History:  Procedure Laterality Date   ABDOMINAL AORTOGRAM W/LOWER EXTREMITY Right 11/24/2021   Procedure: ABDOMINAL AORTOGRAM W/LOWER EXTREMITY;  Surgeon: Leonie Douglas, MD;  Location: [facility] INVASIVE CV  LAB;  Service: Cardiovascular;  Laterality: Right;   AMPUTATION TOE Right 11/26/2021   Procedure: AMPUTATION TOE;  Surgeon: Vivi Barrack, DPM;  Location: [facility] OR;  Service: Podiatry;  Laterality: Right;   ARTERY BIOPSY  02/26/2011   Procedure: BIOPSY TEMPORAL ARTERY;  Surgeon: Iona Coach, MD;  Location: [facility];  Service: General;  Laterality: Left;  Left temporal artery biospy   BACK SURGERY  1985   CHOLECYSTECTOMY     COLONOSCOPY     ESOPHAGOGASTRODUODENOSCOPY N/A 12/06/2022   Procedure: ESOPHAGOGASTRODUODENOSCOPY (EGD);  Surgeon: Shellia Cleverly, DO;  Location: [facility] ENDOSCOPY;  Service: Gastroenterology;  Laterality: N/A;   EUS     HEMOSTASIS CLIP PLACEMENT  12/06/2022   Procedure: HEMOSTASIS CLIP PLACEMENT;  Surgeon: Shellia Cleverly, DO;  Location: [facility] ENDOSCOPY;  Service: Gastroenterology;;   HOT HEMOSTASIS N/A 12/06/2022   Procedure: HOT HEMOSTASIS (ARGON PLASMA COAGULATION/BICAP);  Surgeon: Shellia Cleverly, DO;  Location: [facility] ENDOSCOPY;  Service: Gastroenterology;  Laterality: N/A;   NECK SURGERY  1985   PERIPHERAL VASCULAR BALLOON ANGIOPLASTY Right 11/24/2021   Procedure: PERIPHERAL VASCULAR BALLOON ANGIOPLASTY;  Surgeon: Leonie Douglas, MD;  Location: [facility] INVASIVE CV LAB;  Service: Cardiovascular;  Laterality: Right;  POPLITEAL AND PERONEAL   TEMPORAL ARTERY BIOPSY / LIGATION  02/26/2011   left side   TRANSMETATARSAL AMPUTATION Right 01/28/2022   Procedure: TRANSMETATARSAL AMPUTATION; ACHILLES TENDON LENGTHENING;  Surgeon: Candelaria Stagers, DPM;  Location: [facility] ORS;  Service: Podiatry;  Laterality: Right;  R foot Transmetatarsal amputation, possible tendo achilles lengthening   UPPER GASTROINTESTINAL ENDOSCOPY      Social History   Socioeconomic History   Marital status: Married    Spouse name: Not on file   Number of children: 6   Years of education: Not on file   Highest education level: Not on file  Occupational History   Not on file  Tobacco  Use   Smoking status: Former    Current packs/day: 0.00    Types: Cigarettes    Start date: 04/10/1955    Quit date: 04/09/1990    Years since quitting: 32.8   Smokeless tobacco: Never  Substance and Sexual Activity   Alcohol use: No    Comment: "per pt stopped drinking in the 90's"   Drug use: No   Sexual activity: Not on file  Other Topics Concern   Not on file  Social History Narrative   Not on file   Social Determinants of Health   Financial Resource Strain: Not on file  Food Insecurity: No Food Insecurity (12/05/2022)   Hunger Vital Sign    Worried About Running Out of Food in the Last Year: Never true    Ran Out of Food in the Last Year: Never true  Transportation Needs: No Transportation Needs (12/05/2022)   PRAPARE - Administrator, Civil Service (Medical): No    Lack of Transportation (Non-Medical): No  Physical Activity: Not on file  Stress: Not on file  Social Connections: Not on file  Intimate Partner Violence: Not At Risk (12/05/2022)   Humiliation, Afraid, Rape, and Kick questionnaire    Fear of Current or Ex-Partner: No    Emotionally Abused: No    Physically Abused: No    Sexually Abused: No    Family History  Problem Relation Age of Onset   Heart disease Mother    Amblyopia Father    Diabetes Sister        x 2   Heart attack Sister     Current Outpatient Medications  Medication Sig Dispense Refill   sertraline (ZOLOFT) 25 MG tablet Take 25 mg by mouth daily.     acetaminophen (TYLENOL) 325 MG tablet Take 650 mg by mouth in the morning.     Alcohol Swabs PADS Use to check blood sugar 3 times daily. Dx: E10.9 300 each 3   aspirin 81 MG chewable tablet Chew 81 mg by mouth daily.     atorvastatin (LIPITOR) 40 MG tablet Take 40 mg by mouth at bedtime.     Blood Glucose Calibration (ACCU-CHEK AVIVA) SOLN Use to check blood sugar 3 times daily. Dx: E10.9 1 each 3   Blood Glucose Monitoring Suppl (ACCU-CHEK AVIVA PLUS) w/Device KIT Use to check  blood sugar 3 times daily. Dx: E10.9 1 kit 0   BREO ELLIPTA 100-25 MCG/ACT AEPB Inhale 1 puff into the lungs daily.     cefdinir (OMNICEF) 300 MG capsule Take 1 capsule (300 mg total) by mouth every evening. (Patient not taking: Reported on 02/12/2023)     [employer] SALINE NOSE SPRAY NA Place 2 sprays into the nose 2 (two) times daily.     divalproex (DEPAKOTE SPRINKLE) 125 MG capsule Take 125 mg by mouth 2 (two) times daily.     docusate sodium (COLACE) 100 MG capsule Take 100 mg by mouth 2 (two) times daily.     doxycycline (VIBRA-TABS) 100 MG tablet Take 1  tablet (100 mg total) by mouth 2 (two) times daily. 180 tablet 0   Dulaglutide 1.5 MG/0.5ML SOPN Inject 1.5 mg into the skin once a week. tuesday     ferrous gluconate (FERGON) 324 MG tablet Take 324 mg by mouth daily.     gabapentin (NEURONTIN) 100 MG capsule Take 100 mg by mouth 2 (two) times daily.     glucagon (GLUCAGON EMERGENCY) 1 MG injection Inject 1 mg into the muscle once as needed. (Patient taking differently: Inject 1 mg into the muscle once as needed (for low blood sugar).) 1 each 12   glucose blood (ACCU-CHEK AVIVA PLUS) test strip Use to check blood sugar 3 times daily. Dx: E10.9 300 each 3   hydrOXYzine (ATARAX) 10 MG tablet Take 10 mg by mouth 2 (two) times daily. hold for sedation     insulin glargine, 2 Unit Dial, (TOUJEO MAX SOLOSTAR) 300 UNIT/ML Solostar Pen Inject 19 Units into the skin daily.     Insulin Pen Needle 32G X 4 MM MISC Use as directed to inject insulin tidwc 100 each 11   Ipratropium-Albuterol (COMBIVENT) 20-100 MCG/ACT AERS respimat Inhale 1 puff into the lungs every 6 (six) hours as needed for wheezing. (Patient taking differently: Inhale 1 puff into the lungs every 6 (six) hours as needed (for COPD).)     isosorbide mononitrate (ISMO) 10 MG tablet Take 10 mg by mouth at bedtime.     Lancets (ACCU-CHEK SOFT TOUCH) lancets Use to check blood sugar 3 times daily. Dx: E10.9 300 each 3   loratadine (CLARITIN) 10 MG  tablet Take 10 mg by mouth daily.     NOVOLOG FLEXPEN 100 UNIT/ML FlexPen Inject 0-8 Units into the skin See admin instructions. Inject 0-8 units into the skin before lunch and supper, per sliding scale: if is BS 70-200 give 0 units, if BS is 201-250 give 2 units, if BS is 251-300 give 4 units, if BS is 301-350 give 6 units, if BS is 351-400 give 8 units. If blood sugar is greater than 400, call MD.     oxycodone (OXY-IR) 5 MG capsule Take 1 capsule (5 mg total) by mouth every 8 (eight) hours as needed for pain. 10 capsule 0   oxymetazoline (AFRIN) 0.05 % nasal spray Place 2 sprays into both nostrils 2 (two) times daily as needed for congestion.     Pancrelipase, Lip-Prot-Amyl, (CREON) 24000-76000 units CPEP Take 1 capsule by mouth 3 (three) times daily. With 4-6 ounces of water     pantoprazole (PROTONIX) 40 MG tablet Take 1 tablet (40 mg total) by mouth daily.     Pollen Extracts (PROSTAT PO) Take 30 mLs by mouth 3 (three) times daily.     repaglinide (PRANDIN) 0.5 MG tablet Take 0.5 mg by mouth 2 (two) times daily before a meal.     Skin Protectants, Misc. (MINERIN CREME EX) Apply 1 Application topically daily. Both legs and feet.     Spacer/Aero-Holding Rudean Curt Use as directed with combivent. Ok to dispense any spacer that works with the inhaler being rxed to pt. 1 each 1   No current facility-administered medications for this visit.    No Known Allergies   REVIEW OF SYSTEMS:  [X]  denotes positive finding, [ ]  denotes negative finding Cardiac  Comments:  Chest pain or chest pressure:    Shortness of breath upon exertion:    Short of breath when lying flat:    Irregular heart rhythm:  Vascular    Pain in calf, thigh, or hip brought on by ambulation:    Pain in feet at night that wakes you up from your sleep:     Blood clot in your veins:    Leg swelling:         Pulmonary    Oxygen at home:    Productive cough:     Wheezing:         Neurologic    Sudden weakness in  arms or legs:     Sudden numbness in arms or legs:     Sudden onset of difficulty speaking or slurred speech:    Temporary loss of vision in one eye:     Problems with dizziness:         Gastrointestinal    Blood in stool:     Vomited blood:         Genitourinary    Burning when urinating:     Blood in urine:        Psychiatric    Major depression:         Hematologic    Bleeding problems:    Problems with blood clotting too easily:        Skin    Rashes or ulcers:        Constitutional    Fever or chills:      PHYSICAL EXAMINATION:  Vitals:   02/12/23 1307  BP: (!) 153/73  Pulse: 86  Temp: 98.7 F (37.1 C)  SpO2: 96%    General:  WDWN in NAD; vital signs documented above Gait: Not observed, in wheelchair HENT: WNL, normocephalic Pulmonary: normal non-labored breathing , without wheezing Cardiac: {Desc; regular/irreg:14544} HR Abdomen: soft Vascular Exam/Pulses: *** Extremities: {With/Without:20273} ischemic changes, {With/Without:20273} Gangrene , {With/Without:20273} cellulitis; {With/Without:20273} open wounds;  Musculoskeletal: no muscle wasting or atrophy  Neurologic: A&O X 3 Psychiatric:  The pt has Normal affect.   Non-Invasive Vascular Imaging:   +-------+-----------+-----------+---------------+------------+  ABI/TBIToday's ABIToday's TBIPrevious ABI   Previous TBI  +-------+-----------+-----------+---------------+------------+  Right 1.09       amputation Noncompressibleamputation    +-------+-----------+-----------+---------------+------------+  Left  0.93       0.00       Noncompresible bandage       +-------+-----------+-----------+---------------+------------+    ASSESSMENT/PLAN:: 88 y.o. male here for follow up for ***  - -***   Graceann Congress, PA-C [facility] [phone]  Clinic MD:   Lenell Antu

## 2023-02-13 ENCOUNTER — Encounter: Payer: Self-pay | Admitting: Physician Assistant

## 2023-02-18 ENCOUNTER — Encounter (HOSPITAL_COMMUNITY): Payer: Medicare Other

## 2023-02-18 ENCOUNTER — Telehealth: Payer: Self-pay

## 2023-02-18 ENCOUNTER — Ambulatory Visit (HOSPITAL_COMMUNITY)
Admission: RE | Admit: 2023-02-18 | Discharge: 2023-02-18 | Disposition: A | Discharge status: Home / Self Care | Payer: Medicare Other | Source: Ambulatory Visit | Attending: Nephrology | Admitting: Nephrology

## 2023-02-18 DIAGNOSIS — D631 Anemia in chronic kidney disease: Secondary | ICD-10-CM | POA: Insufficient documentation

## 2023-02-18 DIAGNOSIS — N189 Chronic kidney disease, unspecified: Secondary | ICD-10-CM | POA: Diagnosis not present

## 2023-02-18 MED ORDER — SODIUM CHLORIDE 0.9 % IV SOLN
510.0000 mg | INTRAVENOUS | Status: DC
  Administered 2023-02-18: 510 mg via INTRAVENOUS
  Filled 2023-02-18: qty 510

## 2023-02-18 NOTE — Progress Notes (Signed)
Kindly inform the patient that MRI scan of the brain shows no new abnormality.  There are changes of age-related hardening of the arteries as well as small old strokes in the back portion as well as the deep portion of the brain bilaterally.  Overall no significant change compared with previous MRI from February 2023.

## 2023-02-18 NOTE — Telephone Encounter (Signed)
Called patient son and informed him of his father MRI results! Pt verbalized understanding. Pt had no questions at this time but was encouraged to call back if questions arise.

## 2023-02-18 NOTE — Telephone Encounter (Signed)
-----   Message from Delia Heady sent at 02/18/2023  9:29 AM EST ----- Kindly inform the patient that MRI scan of the brain shows no new abnormality.  There are changes of age-related hardening of the arteries as well as small old strokes in the back portion as well as the deep portion of the brain bilaterally.  Overall no significant change compared with previous MRI from February 2023.

## 2023-02-25 ENCOUNTER — Ambulatory Visit (HOSPITAL_COMMUNITY)
Admission: RE | Admit: 2023-02-25 | Discharge: 2023-02-25 | Disposition: A | Discharge status: Home / Self Care | Payer: Medicare Other | Source: Ambulatory Visit | Attending: Nephrology | Admitting: Nephrology

## 2023-02-25 DIAGNOSIS — D631 Anemia in chronic kidney disease: Secondary | ICD-10-CM | POA: Insufficient documentation

## 2023-02-25 DIAGNOSIS — N189 Chronic kidney disease, unspecified: Secondary | ICD-10-CM | POA: Insufficient documentation

## 2023-02-25 MED ORDER — SODIUM CHLORIDE 0.9 % IV SOLN
510.0000 mg | INTRAVENOUS | Status: DC
  Administered 2023-02-25: 510 mg via INTRAVENOUS
  Filled 2023-02-25: qty 510

## 2023-03-04 NOTE — Progress Notes (Unsigned)
HISTORY AND PHYSICAL     CC:  follow up. Requesting Provider:  Sharlet Salina, MD  HPI: This is a 88 y.o. male who is here today for follow up for PAD.  Pt has hx of angiogram with right popliteal drug coated angioplasty, right TPT angioplasty and right peroneal angioplasty 11/24/2021 by Dr. Lenell Antu for non healing wounds. He has hx of TMA on 01/28/2022 by Dr. Allena Katz.   Pt was last seen 02/12/2023 with his daughter [name] and at that time, he had a new wound between the left 3rd and 4th toes that had been present for about a week.  He was seeing Dr. Allena Katz who recommended local wound care and the daughter felt the facility was not really doing what was instructed.  He was not having any rest pain.  He does not ambulate and is in wheelchair.   The pt returns today for follow up.  ***  The pt is  on a statin for cholesterol management.    The pt is on an aspirin.    Other AC:  none The pt is not on medication for hypertension.  The pt is  on medication for diabetes. Tobacco hx:  former  Pt does *** have family hx of AAA.  Past Medical History:  Diagnosis Date   Acute metabolic encephalopathy 05/24/2021   Arthritis    Diabetes mellitus    HTN (hypertension)    Hypercholesteremia    Iron deficiency    Pancreatitis, acute 06/02/2012   Diagnosed 05/2012    Personal history of colonic adenomas 04/15/2013   04/15/2013 2 diminutive polyps     Stroke [facility])    TIA (transient ischemic attack)     Past Surgical History:  Procedure Laterality Date   ABDOMINAL AORTOGRAM W/LOWER EXTREMITY Right 11/24/2021   Procedure: ABDOMINAL AORTOGRAM W/LOWER EXTREMITY;  Surgeon: Leonie Douglas, MD;  Location: [facility] INVASIVE CV LAB;  Service: Cardiovascular;  Laterality: Right;   AMPUTATION TOE Right 11/26/2021   Procedure: AMPUTATION TOE;  Surgeon: Vivi Barrack, DPM;  Location: [facility] OR;  Service: Podiatry;  Laterality: Right;   ARTERY BIOPSY  02/26/2011   Procedure: BIOPSY TEMPORAL ARTERY;  Surgeon: Iona Coach, MD;  Location:  SURGERY CENTER;  Service: General;  Laterality: Left;  Left temporal artery biospy   BACK SURGERY  1985   CHOLECYSTECTOMY     COLONOSCOPY     ESOPHAGOGASTRODUODENOSCOPY N/A 12/06/2022   Procedure: ESOPHAGOGASTRODUODENOSCOPY (EGD);  Surgeon: Shellia Cleverly, DO;  Location: [facility] ENDOSCOPY;  Service: Gastroenterology;  Laterality: N/A;   EUS     HEMOSTASIS CLIP PLACEMENT  12/06/2022   Procedure: HEMOSTASIS CLIP PLACEMENT;  Surgeon: Shellia Cleverly, DO;  Location: [facility] ENDOSCOPY;  Service: Gastroenterology;;   HOT HEMOSTASIS N/A 12/06/2022   Procedure: HOT HEMOSTASIS (ARGON PLASMA COAGULATION/BICAP);  Surgeon: Shellia Cleverly, DO;  Location: [facility] ENDOSCOPY;  Service: Gastroenterology;  Laterality: N/A;   NECK SURGERY  1985   PERIPHERAL VASCULAR BALLOON ANGIOPLASTY Right 11/24/2021   Procedure: PERIPHERAL VASCULAR BALLOON ANGIOPLASTY;  Surgeon: Leonie Douglas, MD;  Location: [facility] INVASIVE CV LAB;  Service: Cardiovascular;  Laterality: Right;  POPLITEAL AND PERONEAL   TEMPORAL ARTERY BIOPSY / LIGATION  02/26/2011   left side   TRANSMETATARSAL AMPUTATION Right 01/28/2022   Procedure: TRANSMETATARSAL AMPUTATION; ACHILLES TENDON LENGTHENING;  Surgeon: Candelaria Stagers, DPM;  Location: [facility] ORS;  Service: Podiatry;  Laterality: Right;  R foot Transmetatarsal amputation, possible tendo achilles lengthening   UPPER GASTROINTESTINAL ENDOSCOPY  No Known Allergies  Current Outpatient Medications  Medication Sig Dispense Refill   acetaminophen (TYLENOL) 325 MG tablet Take 650 mg by mouth in the morning.     Alcohol Swabs PADS Use to check blood sugar 3 times daily. Dx: E10.9 300 each 3   aspirin 81 MG chewable tablet Chew 81 mg by mouth daily.     atorvastatin (LIPITOR) 40 MG tablet Take 40 mg by mouth at bedtime.     Blood Glucose Calibration (ACCU-CHEK AVIVA) SOLN Use to check blood sugar 3 times daily. Dx: E10.9 1 each 3   Blood Glucose Monitoring Suppl (ACCU-CHEK  AVIVA PLUS) w/Device KIT Use to check blood sugar 3 times daily. Dx: E10.9 1 kit 0   BREO ELLIPTA 100-25 MCG/ACT AEPB Inhale 1 puff into the lungs daily.     cefdinir (OMNICEF) 300 MG capsule Take 1 capsule (300 mg total) by mouth every evening. (Patient not taking: Reported on 02/12/2023)     [employer] SALINE NOSE SPRAY NA Place 2 sprays into the nose 2 (two) times daily.     divalproex (DEPAKOTE SPRINKLE) 125 MG capsule Take 125 mg by mouth 2 (two) times daily.     docusate sodium (COLACE) 100 MG capsule Take 100 mg by mouth 2 (two) times daily.     doxycycline (VIBRA-TABS) 100 MG tablet Take 1 tablet (100 mg total) by mouth 2 (two) times daily. 180 tablet 0   Dulaglutide 1.5 MG/0.5ML SOPN Inject 1.5 mg into the skin once a week. tuesday     ferrous gluconate (FERGON) 324 MG tablet Take 324 mg by mouth daily.     gabapentin (NEURONTIN) 100 MG capsule Take 100 mg by mouth 2 (two) times daily.     glucagon (GLUCAGON EMERGENCY) 1 MG injection Inject 1 mg into the muscle once as needed. (Patient taking differently: Inject 1 mg into the muscle once as needed (for low blood sugar).) 1 each 12   glucose blood (ACCU-CHEK AVIVA PLUS) test strip Use to check blood sugar 3 times daily. Dx: E10.9 300 each 3   hydrOXYzine (ATARAX) 10 MG tablet Take 10 mg by mouth 2 (two) times daily. hold for sedation     insulin glargine, 2 Unit Dial, (TOUJEO MAX SOLOSTAR) 300 UNIT/ML Solostar Pen Inject 19 Units into the skin daily.     Insulin Pen Needle 32G X 4 MM MISC Use as directed to inject insulin tidwc 100 each 11   Ipratropium-Albuterol (COMBIVENT) 20-100 MCG/ACT AERS respimat Inhale 1 puff into the lungs every 6 (six) hours as needed for wheezing. (Patient taking differently: Inhale 1 puff into the lungs every 6 (six) hours as needed (for COPD).)     isosorbide mononitrate (ISMO) 10 MG tablet Take 10 mg by mouth at bedtime.     Lancets (ACCU-CHEK SOFT TOUCH) lancets Use to check blood sugar 3 times daily. Dx: E10.9 300  each 3   loratadine (CLARITIN) 10 MG tablet Take 10 mg by mouth daily.     NOVOLOG FLEXPEN 100 UNIT/ML FlexPen Inject 0-8 Units into the skin See admin instructions. Inject 0-8 units into the skin before lunch and supper, per sliding scale: if is BS 70-200 give 0 units, if BS is 201-250 give 2 units, if BS is 251-300 give 4 units, if BS is 301-350 give 6 units, if BS is 351-400 give 8 units. If blood sugar is greater than 400, call MD.     oxycodone (OXY-IR) 5 MG capsule Take 1 capsule (5 mg total)  by mouth every 8 (eight) hours as needed for pain. 10 capsule 0   oxymetazoline (AFRIN) 0.05 % nasal spray Place 2 sprays into both nostrils 2 (two) times daily as needed for congestion.     Pancrelipase, Lip-Prot-Amyl, (CREON) 24000-76000 units CPEP Take 1 capsule by mouth 3 (three) times daily. With 4-6 ounces of water     pantoprazole (PROTONIX) 40 MG tablet Take 1 tablet (40 mg total) by mouth daily.     Pollen Extracts (PROSTAT PO) Take 30 mLs by mouth 3 (three) times daily.     repaglinide (PRANDIN) 0.5 MG tablet Take 0.5 mg by mouth 2 (two) times daily before a meal.     sertraline (ZOLOFT) 25 MG tablet Take 25 mg by mouth daily.     Skin Protectants, Misc. (MINERIN CREME EX) Apply 1 Application topically daily. Both legs and feet.     Spacer/Aero-Holding Rudean Curt Use as directed with combivent. Ok to dispense any spacer that works with the inhaler being rxed to pt. 1 each 1   No current facility-administered medications for this visit.    Family History  Problem Relation Age of Onset   Heart disease Mother    Amblyopia Father    Diabetes Sister        x 2   Heart attack Sister     Social History   Socioeconomic History   Marital status: Married    Spouse name: Not on file   Number of children: 6   Years of education: Not on file   Highest education level: Not on file  Occupational History   Not on file  Tobacco Use   Smoking status: Former    Current packs/day: 0.00     Types: Cigarettes    Start date: 04/10/1955    Quit date: 04/09/1990    Years since quitting: 32.9   Smokeless tobacco: Never  Substance and Sexual Activity   Alcohol use: No    Comment: "per pt stopped drinking in the 90's"   Drug use: No   Sexual activity: Not on file  Other Topics Concern   Not on file  Social History Narrative   Not on file   Social Determinants of Health   Financial Resource Strain: Not on file  Food Insecurity: No Food Insecurity (12/05/2022)   Hunger Vital Sign    Worried About Running Out of Food in the Last Year: Never true    Ran Out of Food in the Last Year: Never true  Transportation Needs: No Transportation Needs (12/05/2022)   PRAPARE - Administrator, Civil Service (Medical): No    Lack of Transportation (Non-Medical): No  Physical Activity: Not on file  Stress: Not on file  Social Connections: Not on file  Intimate Partner Violence: Not At Risk (12/05/2022)   Humiliation, Afraid, Rape, and Kick questionnaire    Fear of Current or Ex-Partner: No    Emotionally Abused: No    Physically Abused: No    Sexually Abused: No     REVIEW OF SYSTEMS:  *** [X]  denotes positive finding, [ ]  denotes negative finding Cardiac  Comments:  Chest pain or chest pressure:    Shortness of breath upon exertion:    Short of breath when lying flat:    Irregular heart rhythm:        Vascular    Pain in calf, thigh, or hip brought on by ambulation:    Pain in feet at night that wakes you up  from your sleep:     Blood clot in your veins:    Leg swelling:         Pulmonary    Oxygen at home:    Productive cough:     Wheezing:         Neurologic    Sudden weakness in arms or legs:     Sudden numbness in arms or legs:     Sudden onset of difficulty speaking or slurred speech:    Temporary loss of vision in one eye:     Problems with dizziness:         Gastrointestinal    Blood in stool:     Vomited blood:         Genitourinary    Burning  when urinating:     Blood in urine:        Psychiatric    Major depression:         Hematologic    Bleeding problems:    Problems with blood clotting too easily:        Skin    Rashes or ulcers:        Constitutional    Fever or chills:      PHYSICAL EXAMINATION:  ***  General:  WDWN in NAD; vital signs documented above Gait: Not observed HENT: WNL, normocephalic Pulmonary: normal non-labored breathing , without wheezing Cardiac: {Desc; regular/irreg:14544} HR, {With/Without:20273} carotid bruit*** Abdomen: soft, NT; aortic pulse is *** palpable Skin: {With/Without:20273} rashes Vascular Exam/Pulses:  Right Left  Radial {Exam; arterial pulse strength 0-4:30167} {Exam; arterial pulse strength 0-4:30167}  Femoral {Exam; arterial pulse strength 0-4:30167} {Exam; arterial pulse strength 0-4:30167}  Popliteal {Exam; arterial pulse strength 0-4:30167} {Exam; arterial pulse strength 0-4:30167}  DP {Exam; arterial pulse strength 0-4:30167} {Exam; arterial pulse strength 0-4:30167}  PT {Exam; arterial pulse strength 0-4:30167} {Exam; arterial pulse strength 0-4:30167}  Peroneal *** ***   Extremities: {With/Without:20273} ischemic changes, {With/Without:20273} Gangrene , {With/Without:20273} cellulitis; {With/Without:20273} open wounds Musculoskeletal: no muscle wasting or atrophy  Neurologic: A&O X 3 Psychiatric:  The pt has {Desc; normal/abnormal:11317::"Normal"} affect.   Non-Invasive Vascular Imaging:   ABI's/TBI's on 02/12/2023: Right:  1.09/amp - Great toe pressure: amp Left:  0.93/absent - Great toe pressure: 0   Previous ABI's/TBI's on 09/11/2022: Right:  1.28/amp - Great toe pressure: amp Left:  1.25/bandage - Great toe pressure:  bandage  Previous arterial duplex on 09/11/2022: +----------+--------+-----+--------+----------+-------------------+  RIGHT    PSV cm/sRatioStenosisWaveform  Comments              +----------+--------+-----+--------+----------+-------------------+  CFA Distal131                  biphasic                       +----------+--------+-----+--------+----------+-------------------+  SFA Prox  93                   monophasic                     +----------+--------+-----+--------+----------+-------------------+  SFA Mid   52                   monophasic                     +----------+--------+-----+--------+----------+-------------------+  SFA Distal62                   monophasic                     +----------+--------+-----+--------+----------+-------------------+  POP Prox                                 unable to visualize  +----------+--------+-----+--------+----------+-------------------+  PTA Distal62                   monophasic                     +----------+--------+-----+--------+----------+-------------------+   Summary:  Right: Patent lower extremity arterial system where visualized. Unable to document images of popliteal artery due to patient mobility.     ASSESSMENT/PLAN:: 88 y.o. male here for follow up for PAD with hx of angiogram with right popliteal drug coated angioplasty, right TPT angioplasty and right peroneal angioplasty 11/24/2021 by Dr. Lenell Antu for non healing wounds. He has hx of TMA on 01/28/2022 by Dr. Allena Katz.    -*** -continue asa/statin -pt will f/u in *** with ***.   Doreatha Massed, [facility] [phone]  Clinic MD:   Lenell Antu

## 2023-03-05 ENCOUNTER — Ambulatory Visit (INDEPENDENT_AMBULATORY_CARE_PROVIDER_SITE_OTHER): Payer: Medicare Other | Admitting: Physician Assistant

## 2023-03-05 ENCOUNTER — Encounter: Payer: Self-pay | Admitting: Physician Assistant

## 2023-03-05 VITALS — BP 142/73 | HR 80 | Temp 98.2°F | Resp 18 | Ht 74.0 in | Wt 150.0 lb

## 2023-03-05 DIAGNOSIS — I7025 Atherosclerosis of native arteries of other extremities with ulceration: Secondary | ICD-10-CM

## 2023-03-06 ENCOUNTER — Ambulatory Visit (INDEPENDENT_AMBULATORY_CARE_PROVIDER_SITE_OTHER): Payer: Medicare Other | Admitting: Podiatry

## 2023-03-06 ENCOUNTER — Encounter: Payer: Self-pay | Admitting: Podiatry

## 2023-03-06 VITALS — Ht 74.0 in | Wt 150.0 lb

## 2023-03-06 DIAGNOSIS — L97521 Non-pressure chronic ulcer of other part of left foot limited to breakdown of skin: Secondary | ICD-10-CM | POA: Diagnosis not present

## 2023-03-06 NOTE — Progress Notes (Signed)
Subjective:  Patient ID: Justin [name], male    DOB: [birth year],  MRN: [id number]  Chief Complaint  Patient presents with   Foot Ulcer    Pt is here to f/u on toe ulcer to left foot.    88 y.o. male presents with the above complaint.  Patient presents with left fourth digit ulceration.  He states is doing better.  Has been seen at the wound care center who is managing the wound denies any other acute complaints  Review of Systems: Negative except as noted in the HPI. Denies N/V/F/Ch.  Past Medical History:  Diagnosis Date   Acute metabolic encephalopathy 05/24/2021   Arthritis    Diabetes mellitus    HTN (hypertension)    Hypercholesteremia    Iron deficiency    Pancreatitis, acute 06/02/2012   Diagnosed 05/2012    Personal history of colonic adenomas 04/15/2013   04/15/2013 2 diminutive polyps     Stroke ([name])    TIA (transient ischemic attack)     Current Outpatient Medications:    acetaminophen (TYLENOL) 325 MG tablet, Take 650 mg by mouth in the morning., Disp: , Rfl:    Alcohol Swabs PADS, Use to check blood sugar 3 times daily. Dx: E10.9, Disp: 300 each, Rfl: 3   aspirin 81 MG chewable tablet, Chew 81 mg by mouth daily., Disp: , Rfl:    atorvastatin (LIPITOR) 40 MG tablet, Take 40 mg by mouth at bedtime., Disp: , Rfl:    Blood Glucose Calibration (ACCU-CHEK AVIVA) SOLN, Use to check blood sugar 3 times daily. Dx: E10.9, Disp: 1 each, Rfl: 3   Blood Glucose Monitoring Suppl (ACCU-CHEK AVIVA PLUS) w/Device KIT, Use to check blood sugar 3 times daily. Dx: E10.9, Disp: 1 kit, Rfl: 0   BREO ELLIPTA 100-25 MCG/ACT AEPB, Inhale 1 puff into the lungs daily., Disp: , Rfl:    cefdinir (OMNICEF) 300 MG capsule, Take 1 capsule (300 mg total) by mouth every evening., Disp: , Rfl:    CVS SALINE NOSE SPRAY NA, Place 2 sprays into the nose 2 (two) times daily., Disp: , Rfl:    divalproex (DEPAKOTE SPRINKLE) 125 MG capsule, Take 125 mg by mouth 2 (two) times daily., Disp: , Rfl:    docusate  sodium (COLACE) 100 MG capsule, Take 100 mg by mouth 2 (two) times daily., Disp: , Rfl:    doxycycline (VIBRA-TABS) 100 MG tablet, Take 1 tablet (100 mg total) by mouth 2 (two) times daily., Disp: 180 tablet, Rfl: 0   Dulaglutide 1.5 MG/0.5ML SOPN, Inject 1.5 mg into the skin once a week. tuesday, Disp: , Rfl:    ferrous gluconate (FERGON) 324 MG tablet, Take 324 mg by mouth daily., Disp: , Rfl:    gabapentin (NEURONTIN) 100 MG capsule, Take 100 mg by mouth 2 (two) times daily., Disp: , Rfl:    glucagon (GLUCAGON EMERGENCY) 1 MG injection, Inject 1 mg into the muscle once as needed. (Patient taking differently: Inject 1 mg into the muscle once as needed (for low blood sugar).), Disp: 1 each, Rfl: 12   glucose blood (ACCU-CHEK AVIVA PLUS) test strip, Use to check blood sugar 3 times daily. Dx: E10.9, Disp: 300 each, Rfl: 3   hydrOXYzine (ATARAX) 10 MG tablet, Take 10 mg by mouth 2 (two) times daily. hold for sedation, Disp: , Rfl:    insulin glargine, 2 Unit Dial, (TOUJEO MAX SOLOSTAR) 300 UNIT/ML Solostar Pen, Inject 19 Units into the skin daily., Disp: , Rfl:    Insulin  Pen Needle 32G X 4 MM MISC, Use as directed to inject insulin tidwc, Disp: 100 each, Rfl: 11   Ipratropium-Albuterol (COMBIVENT) 20-100 MCG/ACT AERS respimat, Inhale 1 puff into the lungs every 6 (six) hours as needed for wheezing. (Patient taking differently: Inhale 1 puff into the lungs every 6 (six) hours as needed (for COPD).), Disp: , Rfl:    isosorbide mononitrate (ISMO) 10 MG tablet, Take 10 mg by mouth at bedtime., Disp: , Rfl:    Lancets (ACCU-CHEK SOFT TOUCH) lancets, Use to check blood sugar 3 times daily. Dx: E10.9, Disp: 300 each, Rfl: 3   loratadine (CLARITIN) 10 MG tablet, Take 10 mg by mouth daily., Disp: , Rfl:    NOVOLOG FLEXPEN 100 UNIT/ML FlexPen, Inject 0-8 Units into the skin See admin instructions. Inject 0-8 units into the skin before lunch and supper, per sliding scale: if is BS 70-200 give 0 units, if BS is  201-250 give 2 units, if BS is 251-300 give 4 units, if BS is 301-350 give 6 units, if BS is 351-400 give 8 units. If blood sugar is greater than 400, call MD., Disp: , Rfl:    oxycodone (OXY-IR) 5 MG capsule, Take 1 capsule (5 mg total) by mouth every 8 (eight) hours as needed for pain., Disp: 10 capsule, Rfl: 0   oxymetazoline (AFRIN) 0.05 % nasal spray, Place 2 sprays into both nostrils 2 (two) times daily as needed for congestion., Disp: , Rfl:    Pancrelipase, Lip-Prot-Amyl, (CREON) 24000-76000 units CPEP, Take 1 capsule by mouth 3 (three) times daily. With 4-6 ounces of water, Disp: , Rfl:    pantoprazole (PROTONIX) 40 MG tablet, Take 1 tablet (40 mg total) by mouth daily., Disp: , Rfl:    Pollen Extracts (PROSTAT PO), Take 30 mLs by mouth 3 (three) times daily., Disp: , Rfl:    repaglinide (PRANDIN) 0.5 MG tablet, Take 0.5 mg by mouth 2 (two) times daily before a meal., Disp: , Rfl:    sertraline (ZOLOFT) 25 MG tablet, Take 25 mg by mouth daily., Disp: , Rfl:    Skin Protectants, Misc. (MINERIN CREME EX), Apply 1 Application topically daily. Both legs and feet., Disp: , Rfl:    Spacer/Aero-Holding Chambers DEVI, Use as directed with combivent. Ok to dispense any spacer that works with the inhaler being rxed to pt., Disp: 1 each, Rfl: 1  Social History   Tobacco Use  Smoking Status Former   Current packs/day: 0.00   Types: Cigarettes   Start date: 04/10/1955   Quit date: 04/09/1990   Years since quitting: 32.9  Smokeless Tobacco Never    No Known Allergies Objective:  There were no vitals filed for this visit. Body mass index is 19.26 kg/m. Constitutional Well developed. Well nourished.  Vascular Dorsalis pedis pulses palpable bilaterally. Posterior tibial pulses palpable bilaterally. Capillary refill normal to all digits.  No cyanosis or clubbing noted. Pedal hair growth normal.  Neurologic Normal speech. Oriented to person, place, and time. Epicritic sensation to light touch  grossly present bilaterally.  Dermatologic Left fourth digit ulceration limited to the breakdown of skin does not probe down to deep bone no purulent drainage noted no exposure of bone noted.  Orthopedic: Normal joint ROM without pain or crepitus bilaterally. No visible deformities. No bony tenderness.   Radiographs: None Assessment:   No diagnosis found.  Plan:  Patient was evaluated and treated and all questions answered.  Left fourth digit ulcer limited to the breakdown of the skin -All  questions and concerns were discussed with the patient extensive detail -At this time no gross signs of infection noted.  Will continue to clinically monitor.  Patient being followed at the wound care center and is being seen by them.  At this time I will defer further management to them.  I will see him in periphery if it regresses.  He states understanding  No follow-ups on file.

## 2023-03-12 ENCOUNTER — Other Ambulatory Visit: Payer: Self-pay

## 2023-03-12 DIAGNOSIS — I739 Peripheral vascular disease, unspecified: Secondary | ICD-10-CM

## 2023-03-25 NOTE — Progress Notes (Signed)
LAROYCE, MCDONALD ([id number]) [id number] Page 1 of 12 Visit Report for 03/26/2023 Allergy List Details Patient Name: Date of Service: RO ETSEL, SHED 03/26/2023 12:30 PM Medical Record Number: [id number] Patient Account Number: [id number] Date of Birth/Sex: Treating RN: [birth year] (88 y.o. [name] Primary Care Matthews Franks: [facility], FRIEDA Other Clinician: Referring Aariah Godette: Treating Johniya Durfee/Extender: Meredith [place], [place] RRINA Weeks in Treatment: 0 Allergies Active Allergies No Known Allergies Allergy Notes Electronic Signature(s) Signed: 03/25/2023 11:15:36 AM By: Fonnie Mu RN Entered By: Fonnie Mu on 03/25/2023 11:15:36 -------------------------------------------------------------------------------- Arrival Information Details Patient Name: Date of Service: Monica Martinez T. 03/26/2023 12:30 PM Medical Record Number: [id number] Patient Account Number: [id number] Date of Birth/Sex: Treating RN: [birth year] (88 y.o. [name] Primary Care Talha Iser: MENZER, FRIEDA Other Clinician: Referring Talyah Seder: Treating Cote Mayabb/Extender: Meredith [place], [place] RRINA Weeks in Treatment: 0 Visit Information Patient Arrived: Wheel Chair Arrival Time: 12:47 Accompanied By: aide Transfer Assistance: Hoyer Lift Patient Identification Verified: Yes Secondary Verification Process Completed: Yes Patient Requires Transmission-Based Precautions: No Patient Has Alerts: No History Since Last Visit Added or deleted any medications: No Any new allergies or adverse reactions: No Had a fall or experienced change in activities of daily living that may affect risk of falls: No Signs or symptoms of abuse/neglect since last visito No Hospitalized since last visit: No Implantable device outside of the clinic excluding cellular tissue based products placed in the center since last visit: No Electronic Signature(s) Signed:  03/26/2023 4:32:11 PM By: Fonnie Mu RN Entered By: Fonnie Mu on 03/26/2023 12:48:00 Rennie Plowman ([id number]) [id number] Page 2 of 12 -------------------------------------------------------------------------------- Clinic Level of Care Assessment Details Patient Name: Date of Service: RO KERRIGAN, VANSKIVER 03/26/2023 12:30 PM Medical Record Number: [id number] Patient Account Number: [id number] Date of Birth/Sex: Treating RN: [birth year] (88 y.o. [name] Primary Care Padme Arriaga: MENZER, FRIEDA Other Clinician: Referring Purl Claytor: Treating Keedan Sample/Extender: Baltazar Najjar BA GLIA, [place] RRINA Weeks in Treatment: 0 Clinic Level of Care Assessment Items TOOL 3 Quantity Score X- 1 0 Use when EandM and Procedure is performed on FOLLOW-UP visit ASSESSMENTS - Nursing Assessment / Reassessment X- 1 10 Reassessment of Co-morbidities (includes updates in patient status) X- 1 5 Reassessment of Adherence to Treatment Plan ASSESSMENTS - Wound and Skin Assessment / Reassessment []  - Points for Wound Assessment can only be taken for a new wound of unknown or different etiology and a procedure is 0 NOT performed to that wound []  - 0 Simple Wound Assessment / Reassessment - one wound X- 2 5 Complex Wound Assessment / Reassessment - multiple wounds []  - 0 Dermatologic / Skin Assessment (not related to wound area) ASSESSMENTS - Focused Assessment X- 1 5 Circumferential Edema Measurements - multi extremities []  - 0 Nutritional Assessment / Counseling / Intervention []  - 0 Lower Extremity Assessment (monofilament, tuning fork, pulses) []  - 0 Peripheral Arterial Disease Assessment (using hand held doppler) ASSESSMENTS - Ostomy and/or Continence Assessment and Care []  - 0 Incontinence Assessment and Management []  - 0 Ostomy Care Assessment and Management (repouching, etc.) PROCESS - Coordination of Care []  - Points for Discharge  Coordination can only be taken for a new wound of unknown or different etiology and a procedure 0 is NOT performed to that wound []  - 0 Simple Patient / Family Education for ongoing care X- 1 20 Complex (extensive) Patient / Family Education for ongoing care X- 1 10 Staff obtains Consents, Records, T Results / Process Orders est X-  1 10 Staff telephones HHA, Nursing Homes / Clarify orders / etc []  - 0 Routine Transfer to another Facility (non-emergent condition) []  - 0 Routine Hospital Admission (non-emergent condition) X- 1 15 New Admissions / Manufacturing engineer / Ordering NPWT Apligraf, etc. , []  - 0 Emergency Hospital Admission (emergent condition) []  - 0 Simple Discharge Coordination X- 1 15 Complex (extensive) Discharge Coordination PROCESS - Special Needs []  - 0 Pediatric / Minor Patient Management []  - 0 Isolation Patient Management []  - 0 Hearing / Language / Visual special needs []  - 0 Assessment of Community assistance (transportation, D/C planning, etc.) []  - 0 Additional assistance / Altered mentation []  - 0 Support Surface(s) Assessment (bed, cushion, seat, etc.) INTERVENTIONS - Wound Cleansing / Measurement SAKAI, LEAZER T ([id number]) [id number] Page 3 of 12 []  - Points for Wound Cleaning / Measurement, Wound Dressing, Specimen Collection and Specimen taken to lab can only 0 be taken for a new wound of unknown or different etiology and a procedure is NOT performed to that wound []  - 0 Simple Wound Cleansing - one wound X- 2 5 Complex Wound Cleansing - multiple wounds X- 1 5 Wound Imaging (photographs - any number of wounds) []  - 0 Wound Tracing (instead of photographs) []  - 0 Simple Wound Measurement - one wound X- 2 5 Complex Wound Measurement - multiple wounds INTERVENTIONS - Wound Dressings []  - 0 Small Wound Dressing one or multiple wounds X- 2 15 Medium Wound Dressing one or multiple wounds []  - 0 Large  Wound Dressing one or multiple wounds INTERVENTIONS - Miscellaneous []  - 0 External ear exam []  - 0 Specimen Collection (cultures, biopsies, blood, body fluids, etc.) []  - 0 Specimen(s) / Culture(s) sent or taken to Lab for analysis X- 1 10 Patient Transfer (multiple staff / Nurse, adult / Similar devices) []  - 0 Simple Staple / Suture removal (25 or less) []  - 0 Complex Staple / Suture removal (26 or more) []  - 0 Hypo / Hyperglycemic Management (close monitor of Blood Glucose) []  - 0 Ankle / Brachial Index (ABI) - do not check if billed separately X- 1 5 Vital Signs Has the patient been seen at the hospital within the last three years: Yes Total Score: 170 Level Of Care: New/Established - Level 5 Electronic Signature(s) Signed: 03/26/2023 4:32:11 PM By: Fonnie Mu RN Entered By: Fonnie Mu on 03/26/2023 13:55:16 -------------------------------------------------------------------------------- Encounter Discharge Information Details Patient Name: Date of Service: Monica Martinez T. 03/26/2023 12:30 PM Medical Record Number: [id number] Patient Account Number: [id number] Date of Birth/Sex: Treating RN: 1934-10-08 (88 y.o. [name] Primary Care Netha Dafoe: MENZER, FRIEDA Other Clinician: Referring Amberley Hamler: Treating Takeysha Bonk/Extender: Meredith [place], [place] RRINA Weeks in Treatment: 0 Encounter Discharge Information Items Post Procedure Vitals Discharge Condition: Stable Temperature (F): 98.7 Ambulatory Status: Wheelchair Pulse (bpm): 74 Discharge Destination: Home Respiratory Rate (breaths/min): 17 Transportation: Private Auto Blood Pressure (mmHg): 134/74 Accompanied By: aide Schedule Follow-up Appointment: Yes Clinical Summary of Care: Patient Declined Electronic Signature(s) Signed: 03/26/2023 1:56:20 PM By: Fonnie Mu RN Entered By: Fonnie Mu on 03/26/2023 13:56:20 Estelle Grumbles T ([id number])  [id number] Page 4 of 12 -------------------------------------------------------------------------------- Lower Extremity Assessment Details Patient Name: Date of Service: RO KALIB, STARKOVICH 03/26/2023 12:30 PM Medical Record Number: [id number] Patient Account Number: [id number] Date of Birth/Sex: Treating RN: September 15, 1934 (88 y.o. [name] Primary Care Rilley Stash: [facility], FRIED[place] Other Clinician: Referring Burl Tauzin: Treating Ariam Mol/Extender: Meredith [place], [place] RRINA Weeks in Treatment: 0 Edema Assessment Assessed: [  Left: Yes] [Right: No] Edema: [Left: N] [Right: o] Calf Left: Right: Point of Measurement: 40 cm From Medial Instep 30.5 cm Ankle Left: Right: Point of Measurement: 11 cm From Medial Instep 20.5 cm Knee To Floor Left: Right: From Medial Instep 51 cm Vascular Assessment Pulses: Dorsalis Pedis Palpable: [Left:Yes] Posterior Tibial Palpable: [Left:Yes] Extremity colors, hair growth, and conditions: Hair Growth on Extremity: [Left:No] Temperature of Extremity: [Left:Warm] Capillary Refill: [Left:< 3 seconds] Dependent Rubor: [Left:No] Blanched when Elevated: [Left:No No] Toe Nail Assessment Left: Right: Thick: Yes Discolored: Yes Deformed: Yes Improper Length and Hygiene: Yes Electronic Signature(s) Signed: 03/26/2023 4:32:11 PM By: Fonnie Mu RN Entered By: Fonnie Mu on 03/26/2023 13:01:55 -------------------------------------------------------------------------------- Multi Wound Chart Details Patient Name: Date of Service: Monica Martinez T. 03/26/2023 12:30 PM Medical Record Number: [id number] Patient Account Number: [id number] Date of Birth/Sex: Treating RN: 30-Apr-1934 (88 y.o. Francee Piccolo ([id number]) [id number] Page 5 of 12 Primary Care Vontae Court: MENZER, FRIEDA Other Clinician: Referring Ishmel Acevedo: Treating Mirren Gest/Extender: Meredith [place], [place]  RRINA Weeks in Treatment: 0 Vital Signs Height(in): 74 Pulse(bpm): 85 Weight(lbs): 147 Blood Pressure(mmHg): 130/77 Body Mass Index(BMI): 18.9 Temperature(F): 98.8 Respiratory Rate(breaths/min): 17 [3:Photos:] [N/A:N/A] Left, Plantar T Great oe Left, Medial T Third oe N/A Wound Location: Pressure Injury Gradually Appeared N/A Wounding Event: Arterial Insufficiency Ulcer Arterial Insufficiency Ulcer N/A Primary Etiology: Anemia, Coronary Artery Disease, Anemia, Coronary Artery Disease, N/A Comorbid History: Hypertension, Peripheral Arterial Hypertension, Peripheral Arterial Disease, Type II Diabetes, Disease, Type II Diabetes, Osteoarthritis, Anorexia/bulimia Osteoarthritis, Anorexia/bulimia 03/26/2023 03/26/2023 N/A Date Acquired: 0 0 N/A Weeks of Treatment: Open Open N/A Wound Status: No No N/A Wound Recurrence: 2.1x2.8x0.1 1x0.9x0.2 N/A Measurements L x W x D (cm) 4.618 0.707 N/A A (cm) : rea 0.462 0.141 N/A Volume (cm) : Full Thickness Without Exposed Full Thickness With Exposed Support N/A Classification: Support Structures Structures Medium Medium N/A Exudate A mount: Serosanguineous Serosanguineous N/A Exudate Type: red, brown red, brown N/A Exudate Color: Distinct, outline attached Distinct, outline attached N/A Wound Margin: Large (67-100%) None Present (0%) N/A Granulation A mount: Red, Pink N/A N/A Granulation Quality: None Present (0%) Large (67-100%) N/A Necrotic A mount: N/A Eschar, Adherent Slough N/A Necrotic Tissue: Fascia: No Bone: Yes N/A Exposed Structures: Fat Layer (Subcutaneous Tissue): No Fascia: No Tendon: No Fat Layer (Subcutaneous Tissue): No Muscle: No Tendon: No Joint: No Muscle: No Bone: No Joint: No None None N/A Epithelialization: Debridement - Excisional Debridement - Excisional N/A Debridement: Pre-procedure Verification/Time Out 13:20 13:20 N/A Taken: Lidocaine Lidocaine N/A Pain Control: Subcutaneous,  Slough Subcutaneous, Slough N/A Tissue Debrided: Skin/Subcutaneous Tissue Skin/Subcutaneous Tissue N/A Level: 4.62 0.71 N/A Debridement A (sq cm): rea Curette Curette N/A Instrument: Minimum Minimum N/A Bleeding: Pressure Pressure N/A Hemostasis A chieved: 0 0 N/A Procedural Pain: 0 0 N/A Post Procedural Pain: Procedure was tolerated well Procedure was tolerated well N/A Debridement Treatment Response: 2.1x2.8x0.1 1x0.9x0.2 N/A Post Debridement Measurements L x W x D (cm) 0.462 0.141 N/A Post Debridement Volume: (cm) Excoriation: No Excoriation: No N/A Periwound Skin Texture: Induration: No Induration: No Callus: No Callus: No Crepitus: No Crepitus: No Rash: No Rash: No Scarring: No Scarring: No Dry/Scaly: Yes Dry/Scaly: Yes N/A Periwound Skin Moisture: Maceration: No Maceration: No Atrophie Blanche: No Atrophie Blanche: No N/A Periwound Skin Color: Cyanosis: No Cyanosis: No Ecchymosis: No Ecchymosis: No Erythema: No Erythema: No Hemosiderin Staining: No Hemosiderin Staining: No Mottled: No Mottled: No GRANTLEE, SLANKARD ([id number]) [id number] Page 6 of 12 Pallor: No Pallor: No Rubor:  No Rubor: No No Abnormality No Abnormality N/A Temperature: Yes Yes N/A Tenderness on Palpation: Debridement Debridement N/A Procedures Performed: Treatment Notes Wound #3 (Toe Great) Wound Laterality: Plantar, Left Cleanser Soap and Water Discharge Instruction: May shower and wash wound with dial antibacterial soap and water prior to dressing change. Wound Cleanser Discharge Instruction: Cleanse the wound with wound cleanser prior to applying a clean dressing using gauze sponges, not tissue or cotton balls. Peri-Wound Care Topical Primary Dressing Promogran Prisma Matrix, 4.34 (sq in) (silver collagen) Discharge Instruction: Moisten collagen with saline or hydrogel Secondary Dressing Woven Gauze Sponge, Non-Sterile 4x4  in Discharge Instruction: Apply over primary dressing as directed. Secured With [employer], 4.5x3.1 (in/yd) Discharge Instruction: Secure with Kerlix as directed. 22M Medipore H Soft Cloth Surgical T ape, 4 x 10 (in/yd) Discharge Instruction: Secure with tape as directed. Compression Wrap Compression Stockings Add-Ons Wound #4 (Toe Third) Wound Laterality: Left, Medial Cleanser Soap and Water Discharge Instruction: May shower and wash wound with dial antibacterial soap and water prior to dressing change. Wound Cleanser Discharge Instruction: Cleanse the wound with wound cleanser prior to applying a clean dressing using gauze sponges, not tissue or cotton balls. Peri-Wound Care Topical Primary Dressing Promogran Prisma Matrix, 4.34 (sq in) (silver collagen) Discharge Instruction: Moisten collagen with saline or hydrogel Secondary Dressing Woven Gauze Sponge, Non-Sterile 4x4 in Discharge Instruction: Apply over primary dressing as directed. Secured With [employer], 4.5x3.1 (in/yd) Discharge Instruction: Secure with Kerlix as directed. 22M Medipore H Soft Cloth Surgical T ape, 4 x 10 (in/yd) Discharge Instruction: Secure with tape as directed. Compression Wrap Compression Stockings Add-Ons Electronic Signature(s) Signed: 03/26/2023 4:32:13 PM By: Baltazar Najjar MD Rennie Plowman ([id number]) PM By: Baltazar Najjar MD [phone].pdf Page 7 of 12 Signed: 03/26/2023 4:32:13 Entered By: Baltazar Najjar on 03/26/2023 14:06:50 -------------------------------------------------------------------------------- Multi-Disciplinary Care Plan Details Patient Name: Date of Service: RO BALIAN, CHAUDOIN 03/26/2023 12:30 PM Medical Record Number: [id number] Patient Account Number: [id number] Date of Birth/Sex: Treating RN: 12-01-1934 (88 y.o. [name] Primary Care Gabrien Mentink: MENZER, FRIEDA Other Clinician: Referring Excell Neyland: Treating  Magdelyn Roebuck/Extender: Meredith [place], [place] RRINA Weeks in Treatment: 0 Active Inactive Orientation to the Wound Care Program Nursing Diagnoses: Knowledge deficit related to the wound healing center program Goals: Patient/caregiver will verbalize understanding of the Wound Healing Center Program Date Initiated: 03/26/2023 Target Resolution Date: 04/11/2023 Goal Status: Active Interventions: Provide education on orientation to the wound center Notes: Wound/Skin Impairment Nursing Diagnoses: Impaired tissue integrity Knowledge deficit related to ulceration/compromised skin integrity Goals: Patient will have a decrease in wound volume by X% from date: (specify in notes) Date Initiated: 03/26/2023 Target Resolution Date: 04/12/2023 Goal Status: Active Patient/caregiver will verbalize understanding of skin care regimen Date Initiated: 03/26/2023 Target Resolution Date: 04/13/2023 Goal Status: Active Ulcer/skin breakdown will have a volume reduction of 30% by week 4 Date Initiated: 03/26/2023 Target Resolution Date: 04/12/2023 Goal Status: Active Interventions: Assess patient/caregiver ability to obtain necessary supplies Assess patient/caregiver ability to perform ulcer/skin care regimen upon admission and as needed Assess ulceration(s) every visit Notes: Electronic Signature(s) Signed: 03/26/2023 1:12:37 PM By: Fonnie Mu RN Entered By: Fonnie Mu on 03/26/2023 13:12:36 Pain Assessment Details -------------------------------------------------------------------------------- Rennie Plowman ([id number]) [id number] Page 8 of 12 Patient Name: Date of Service: RO ERMAL, LUDDY 03/26/2023 12:30 PM Medical Record Number: [id number] Patient Account Number: [id number] Date of Birth/Sex: Treating RN: Oct 18, 1934 (88 y.o. [name] Primary Care Keyanna Sandefer: MENZER, FRIEDA Other Clinician: Referring Byran Bilotti: Treating  Malachy Coleman/Extender:  Baltazar Najjar BA GLIA, [place] RRINA Weeks in Treatment: 0 Active Problems Location of Pain Severity and Description of Pain Patient Has Paino No Site Locations Pain Management and Medication Current Pain Management: Electronic Signature(s) Signed: 03/26/2023 4:32:11 PM By: Fonnie Mu RN Entered By: Fonnie Mu on 03/26/2023 12:52:51 -------------------------------------------------------------------------------- Patient/Caregiver Education Details Patient Name: Date of Service: RO Serita Grit 12/17/2024andnbsp12:30 PM Medical Record Number: [id number] Patient Account Number: [id number] Date of Birth/Gender: Treating RN: [birth year] (88 y.o. [name] Primary Care Physician: Crista Luria Other Clinician: Referring Physician: Treating Physician/Extender: Meredith [place], [place] RRINA Weeks in Treatment: 0 Education Assessment Education Provided To: Patient Education Topics Provided Welcome T [facility]New Patient Packet: o Methods: Explain/Verbal Responses: Reinforcements needed, State content correctly Electronic Signature(s) Signed: 03/26/2023 4:32:11 PM By: Fonnie Mu RN Entered By: Fonnie Mu on 03/26/2023 13:12:48 Rennie Plowman ([id number]) [id number] Page 9 of 12 -------------------------------------------------------------------------------- Wound Assessment Details Patient Name: Date of Service: RO TAVARE, FOLAN 03/26/2023 12:30 PM Medical Record Number: [id number] Patient Account Number: [id number] Date of Birth/Sex: Treating RN: January 18, 1935 (88 y.o. [name] Primary Care Breyonna Nault: MENZER, FRIEDA Other Clinician: Referring Christhoper Busbee: Treating Calen Geister/Extender: Meredith [place], [place] RRINA Weeks in Treatment: 0 Wound Status Wound Number: 3 Primary Arterial Insufficiency Ulcer Etiology: Wound Location: Left, Plantar T Great oe Wound Open Wounding Event:  Pressure Injury Status: Date Acquired: 03/26/2023 Comorbid Anemia, Coronary Artery Disease, Hypertension, Peripheral Arterial Weeks Of Treatment: 0 History: Disease, Type II Diabetes, Osteoarthritis, Anorexia/bulimia Clustered Wound: No Photos Wound Measurements Length: (cm) 2.1 Width: (cm) 2.8 Depth: (cm) 0.1 Area: (cm) 4.618 Volume: (cm) 0.462 % Reduction in Area: % Reduction in Volume: Epithelialization: None Tunneling: No Undermining: No Wound Description Classification: Full Thickness Without Exposed Suppor Wound Margin: Distinct, outline attached Exudate Amount: Medium Exudate Type: Serosanguineous Exudate Color: red, brown t Structures Foul Odor After Cleansing: No Slough/Fibrino No Wound Bed Granulation Amount: Large (67-100%) Exposed Structure Granulation Quality: Red, Pink Fascia Exposed: No Necrotic Amount: None Present (0%) Fat Layer (Subcutaneous Tissue) Exposed: No Tendon Exposed: No Muscle Exposed: No Joint Exposed: No Bone Exposed: No Periwound Skin Texture Texture Color No Abnormalities Noted: No No Abnormalities Noted: No Callus: No Atrophie Blanche: No Crepitus: No Cyanosis: No Excoriation: No Ecchymosis: No Induration: No Erythema: No Rash: No Hemosiderin Staining: No Scarring: No Mottled: No Pallor: No Moisture Rubor: No No Abnormalities Noted: No Dry / Scaly: Yes Temperature / Pain Maceration: No Temperature: No Abnormality Tenderness on PalpationADVAIT, MCCOIG ([id number]) [id number] Page 10 of 12 Treatment Notes Wound #3 (Toe Great) Wound Laterality: Plantar, Left Cleanser Soap and Water Discharge Instruction: May shower and wash wound with dial antibacterial soap and water prior to dressing change. Wound Cleanser Discharge Instruction: Cleanse the wound with wound cleanser prior to applying a clean dressing using gauze sponges, not tissue or cotton balls. Peri-Wound Care Topical Primary  Dressing Promogran Prisma Matrix, 4.34 (sq in) (silver collagen) Discharge Instruction: Moisten collagen with saline or hydrogel Secondary Dressing Woven Gauze Sponge, Non-Sterile 4x4 in Discharge Instruction: Apply over primary dressing as directed. Secured With [employer], 4.5x3.1 (in/yd) Discharge Instruction: Secure with Kerlix as directed. 78M Medipore H Soft Cloth Surgical T ape, 4 x 10 (in/yd) Discharge Instruction: Secure with tape as directed. Compression Wrap Compression Stockings Add-Ons Electronic Signature(s) Signed: 03/26/2023 4:32:11 PM By: Fonnie Mu RN Signed: 03/27/2023 5:28:18 PM By: Thayer Dallas Entered By: Thayer Dallas on 03/26/2023 13:10:24 -------------------------------------------------------------------------------- Wound Assessment  Details Patient Name: Date of Service: RO ADEBAYO, HOLTER 03/26/2023 12:30 PM Medical Record Number: [id number] Patient Account Number: [id number] Date of Birth/Sex: Treating RN: 01/10/1935 (88 y.o. [name] Primary Care Gunda Maqueda: MENZER, FRIEDA Other Clinician: Referring Mihaela Fajardo: Treating Shakendra Griffeth/Extender: Baltazar Najjar BA GLIA, [place] RRINA Weeks in Treatment: 0 Wound Status Wound Number: 4 Primary Arterial Insufficiency Ulcer Etiology: Wound Location: Left, Medial T Third oe Wound Open Wounding Event: Gradually Appeared Status: Date Acquired: 03/26/2023 Comorbid Anemia, Coronary Artery Disease, Hypertension, Peripheral Arterial Weeks Of Treatment: 0 History: Disease, Type II Diabetes, Osteoarthritis, Anorexia/bulimia Clustered Wound: No Photos HARVIE, PESSIN ([id number]) [id number] Page 11 of 12 Wound Measurements Length: (cm) 1 Width: (cm) 0.9 Depth: (cm) 0.2 Area: (cm) 0.707 Volume: (cm) 0.141 % Reduction in Area: % Reduction in Volume: Epithelialization: None Tunneling: No Undermining: No Wound Description Classification: Full Thickness With  Exposed Support Structures Wound Margin: Distinct, outline attached Exudate Amount: Medium Exudate Type: Serosanguineous Exudate Color: red, brown Foul Odor After Cleansing: No Slough/Fibrino Yes Wound Bed Granulation Amount: None Present (0%) Exposed Structure Necrotic Amount: Large (67-100%) Fascia Exposed: No Necrotic Quality: Eschar, Adherent Slough Fat Layer (Subcutaneous Tissue) Exposed: No Tendon Exposed: No Muscle Exposed: No Joint Exposed: No Bone Exposed: Yes Periwound Skin Texture Texture Color No Abnormalities Noted: No No Abnormalities Noted: No Callus: No Atrophie Blanche: No Crepitus: No Cyanosis: No Excoriation: No Ecchymosis: No Induration: No Erythema: No Rash: No Hemosiderin Staining: No Scarring: No Mottled: No Pallor: No Moisture Rubor: No No Abnormalities Noted: No Dry / Scaly: Yes Temperature / Pain Maceration: No Temperature: No Abnormality Tenderness on Palpation: Yes Treatment Notes Wound #4 (Toe Third) Wound Laterality: Left, Medial Cleanser Soap and Water Discharge Instruction: May shower and wash wound with dial antibacterial soap and water prior to dressing change. Wound Cleanser Discharge Instruction: Cleanse the wound with wound cleanser prior to applying a clean dressing using gauze sponges, not tissue or cotton balls. Peri-Wound Care Topical Primary Dressing Promogran Prisma Matrix, 4.34 (sq in) (silver collagen) Discharge Instruction: Moisten collagen with saline or hydrogel Secondary Dressing Woven Gauze Sponge, Non-Sterile 4x4 in Discharge Instruction: Apply over primary dressing as directed. Secured With Estelle Grumbles T ([id number]) [id number] Page 12 of 12 Kerlix Roll Sterile, 4.5x3.1 (in/yd) Discharge Instruction: Secure with Kerlix as directed. 56M Medipore H Soft Cloth Surgical T ape, 4 x 10 (in/yd) Discharge Instruction: Secure with tape as directed. Compression Wrap Compression  Stockings Add-Ons Electronic Signature(s) Signed: 03/26/2023 4:32:11 PM By: Fonnie Mu RN Entered By: Fonnie Mu on 03/26/2023 13:26:17 -------------------------------------------------------------------------------- Vitals Details Patient Name: Date of Service: Monica Martinez T. 03/26/2023 12:30 PM Medical Record Number: [id number] Patient Account Number: [id number] Date of Birth/Sex: Treating RN: Jul 01, 1934 (88 y.o. [name] Primary Care Nikhil Osei: MENZER, FRIEDA Other Clinician: Referring Fredi Hurtado: Treating Ludell Zacarias/Extender: Meredith Leeds, [place] RRINA Weeks in Treatment: 0 Vital Signs Time Taken: 12:48 Temperature (F): 98.8 Height (in): 74 Pulse (bpm): 85 Source: Stated Respiratory Rate (breaths/min): 17 Weight (lbs): 147 Blood Pressure (mmHg): 130/77 Body Mass Index (BMI): 18.9 Reference Range: 80 - 120 mg / dl Electronic Signature(s) Signed: 03/26/2023 4:32:11 PM By: Fonnie Mu RN Entered By: Fonnie Mu on 03/26/2023 12:50:35

## 2023-03-26 ENCOUNTER — Encounter (HOSPITAL_BASED_OUTPATIENT_CLINIC_OR_DEPARTMENT_OTHER): Payer: Medicare Other | Attending: Internal Medicine | Admitting: Internal Medicine

## 2023-03-26 DIAGNOSIS — K861 Other chronic pancreatitis: Secondary | ICD-10-CM | POA: Insufficient documentation

## 2023-03-26 DIAGNOSIS — M1990 Unspecified osteoarthritis, unspecified site: Secondary | ICD-10-CM | POA: Diagnosis not present

## 2023-03-26 DIAGNOSIS — D649 Anemia, unspecified: Secondary | ICD-10-CM | POA: Insufficient documentation

## 2023-03-26 DIAGNOSIS — I2510 Atherosclerotic heart disease of native coronary artery without angina pectoris: Secondary | ICD-10-CM | POA: Insufficient documentation

## 2023-03-26 DIAGNOSIS — L97524 Non-pressure chronic ulcer of other part of left foot with necrosis of bone: Secondary | ICD-10-CM | POA: Diagnosis not present

## 2023-03-26 DIAGNOSIS — E11621 Type 2 diabetes mellitus with foot ulcer: Secondary | ICD-10-CM | POA: Insufficient documentation

## 2023-03-26 DIAGNOSIS — Z8673 Personal history of transient ischemic attack (TIA), and cerebral infarction without residual deficits: Secondary | ICD-10-CM | POA: Diagnosis not present

## 2023-03-26 DIAGNOSIS — I10 Essential (primary) hypertension: Secondary | ICD-10-CM | POA: Insufficient documentation

## 2023-03-26 DIAGNOSIS — E1151 Type 2 diabetes mellitus with diabetic peripheral angiopathy without gangrene: Secondary | ICD-10-CM | POA: Diagnosis not present

## 2023-03-26 DIAGNOSIS — Z833 Family history of diabetes mellitus: Secondary | ICD-10-CM | POA: Diagnosis not present

## 2023-03-27 NOTE — Progress Notes (Signed)
Justin Justin ([id number]) [id number] Page 1 of 11 Visit Report for 03/26/2023 Debridement Details Patient Name: Date of Service: Justin Justin, Justin Justin 03/26/2023 12:30 PM Medical Record Number: [id number] Patient Account Number: [id number] Date of Birth/Sex: Treating RN: [birth year] (88 y.o. M) Primary Care Provider: [facility], [name] Other Clinician: Referring Provider: Treating Provider/Extender: Justin Justin, Justin Justin: 0 Debridement Performed for Assessment: Wound #3 Left,Plantar Justin Justin Performed By: Physician Justin Justin., MD The following information was scribed by: Justin Justin The information was scribed for: Justin Justin Debridement Type: Debridement Severity of Tissue Pre Debridement: Fat layer exposed Level of Consciousness (Pre-procedure): Awake and Alert Pre-procedure Verification/Time Out Yes - 13:20 Taken: Start Time: 13:20 Pain Control: Lidocaine Percent of Wound Bed Debrided: 100% Justin Area Debrided (cm): otal 4.62 Tissue and other material debrided: Viable, Non-Viable, Slough, Subcutaneous, Slough Level: Skin/Subcutaneous Tissue Debridement Description: Excisional Instrument: Curette Bleeding: Minimum Hemostasis Achieved: Pressure End Time: 13:20 Procedural Pain: 0 Post Procedural Pain: 0 Response to Justin: Procedure was tolerated well Level of Consciousness (Post- Awake and Alert procedure): Post Debridement Measurements of Total Wound Length: (cm) 2.1 Width: (cm) 2.8 Depth: (cm) 0.1 Volume: (cm) 0.462 Character of Wound/Ulcer Post Debridement: Improved Severity of Tissue Post Debridement: Fat layer exposed Post Procedure Diagnosis Same as Pre-procedure Electronic Signature(s) Signed: 03/26/2023 4:32:13 PM By: Justin Najjar MD Entered By: Justin Justin on 03/26/2023 11:07:00 -------------------------------------------------------------------------------- Debridement  Details Patient Name: Date of Service: Justin Justin. 03/26/2023 12:30 PM Medical Record Number: [id number] Patient Account Number: [id number] Date of Birth/Sex: Treating RN: Feb 27, 1935 (88 y.o. M) Primary Care Provider: [facility], [name] Other Clinician: Referring Provider: Treating Provider/Extender: Justin Justin, Justin Justin: [address] Justin Justin ([id number]) [id number] Page 2 of 11 Debridement Performed for Assessment: Wound #4 Left,Medial Justin Third [name] Performed By: Physician Justin Justin., MD The following information was scribed by: Justin Justin The information was scribed for: Justin Justin Debridement Type: Debridement Severity of Tissue Pre Debridement: Fat layer exposed Level of Consciousness (Pre-procedure): Awake and Alert Pre-procedure Verification/Time Out Yes - 13:20 Taken: Start Time: 13:20 Pain Control: Lidocaine Percent of Wound Bed Debrided: 100% Justin Area Debrided (cm): otal 0.71 Tissue and other material debrided: Viable, Non-Viable, Slough, Subcutaneous, Slough Level: Skin/Subcutaneous Tissue Debridement Description: Excisional Instrument: Curette Bleeding: Minimum Hemostasis Achieved: Pressure End Time: 13:20 Procedural Pain: 0 Post Procedural Pain: 0 Response to Justin: Procedure was tolerated well Level of Consciousness (Post- Awake and Alert procedure): Post Debridement Measurements of Total Wound Length: (cm) 1 Width: (cm) 0.9 Depth: (cm) 0.2 Volume: (cm) 0.141 Character of Wound/Ulcer Post Debridement: Improved Severity of Tissue Post Debridement: Fat layer exposed Post Procedure Diagnosis Same as Pre-procedure Electronic Signature(s) Signed: 03/26/2023 4:32:13 PM By: Justin Najjar MD Entered By: Justin Justin on 03/26/2023 11:07:09 -------------------------------------------------------------------------------- HPI Details Patient Name: Date of Service: Justin Justin. 03/26/2023 12:30 PM Medical Record Number: [id number] Patient Account Number: [id number] Date of Birth/Sex: Treating RN: 1934-04-15 (88 y.o. M) Primary Care Provider: [facility], [name] Other Clinician: Referring Provider: Treating Provider/Extender: Justin Justin, Justin Justin: 0 History of Present Illness HPI Description: ADMISSION 12/19/2021 This is an 88 year old man with multiple medical problems including type 2 diabetes mellitus, chronic pancreatitis, coronary artery disease, peripheral vascular disease, stroke and TIA. He was admitted to the hospital on November 15, 2021 with a diabetic foot infection. An MRI was performed on admission that was questionable for early  osteomyelitis. He was seen in consultation by both podiatry and vascular surgery. Due to limb threatening ischemia, he underwent a lower extremity angiogram with multiple angioplasties on August 18. He then had amputation of his right second toe on August 20. There was frank pus present which was cultured. He was placed on a course of oral antibiotics. He subsequently was discharged but returned due to altered mental status on September 2. Repeat MRI was performed that was again concerning for osteomyelitis. Infectious disease was consulted. A 4-week course of oral doxycycline and cefadroxil was prescribed. Podiatry referred him to the wound care center for further evaluation and management. On exam, there is a recent right second toe amputation site. The Prolene sutures are gaping and the wound has a fairly deep cavity; it does probe to bone. There is no purulent drainage or odor, but there is quite a bit of fat necrosis present. On the lateral aspect of his third toe, there is an ulcer that has a leathery eschar overlying it. Underneath, bone and tendon are exposed. No purulent drainage or malodor from this wound, either. 12/25/2021: There is still extensive slough and fat necrosis in the right  second toe amputation site. The lateral third toe wound has also accumulated quite a bit of slough. Today he did not have the prescribed dressing in situ, just a bit of gauze over the wound sites. Justin Justin ([id number]) [id number] Page 3 of 11 01/01/2022: Patient's facility paperwork today indicates that they are requesting a consultation for possible hyperbaric oxygen therapy. Unfortunately there are no other notes available to me from whomever made this request. There is still substantial slough and nonviable subcutaneous tissue present in both of the wounds. There is bone exposed in both sites. He continues on oral antibiotics per infectious disease. 01/08/2022: Both wounds are a bit cleaner today. It bone is still frankly exposed at both sites. The second toe amputation site is deeper, but this is likely secondary to the aggressive debridements I have been performing. We are in the process of evaluating him for hyperbaric oxygen therapy. He has been optimized from a vascular standpoint. His diabetes is suboptimally controlled, with his most recent hemoglobin A1c being 8.9% on December 10, 2021. He continues on oral antibiotics per infectious disease. 01/16/2022: Due to lack of daily transportation and patient's refusal of chest x-ray and EKG, the decision was made not to further pursue hyperbaric oxygen therapy. Both wounds are about the same size today, there is a little less slough in the amputation site. Bone remains exposed. 01/23/2022: The amputation site is quite a bit cleaner today, but still has some nonviable subcutaneous tissue and fibrinous slough present. The lateral wound on the third toe has gotten worse and I can put a probe all the way through the joint space and pushed through the skin on the other side. No frank necrotic tissue, purulent drainage, or malodor. READMISSION 03/26/2023 This is a patient we had in the clinic in the fall 2023. At  that point he had wounds on his right second toe and lateral right third toe. He went on to have a right TMA on 01/28/2022 by Dr. Allena Katz. And he has nothing open on the right foot. He has known significant PAD he had an angiogram on 11/24/2021 and underwent successful stenting of the right popliteal artery, angioplasty of the tibioperoneal trunk and peroneal artery. He had only primary perfusion of the foot by the peroneal artery. Nevertheless he does not have anything open in  the right foot. He has wounds now on the tip of the left first toe and the lateral part of the left third toe against the PIP and the third fourth webspace. It looks as though they have been using Betadine on these areas. In looking at the last notes by Dr. Allena Katz at podiatry he had an area on the left fourth toe I did not see this today but he did not mention the 1st through 3rd toes. He also has been followed by vascular surgery. On 02/12/2023 for new wounds on the left foot. They note ABIs on the right of 1.09 and on the left at 0.93. He was felt to have severe tibial disease and was nonambulatory. He was referred to [place] for review of the wounds. Past medical history includes PAD as noted, back surgery, cholecystectomy, neck surgery in 1985, temporal artery biopsy in 2012 transmetatarsal amputation in October 2023. I am not exactly sure why he is nonambulatory. There may be some cognitive loss as well ABI Findings: +--------+------------------+-----+----------+--------+ Right Rt Pressure (mmHg)IndexWaveform Comment  +--------+------------------+-----+----------+--------+ [id number]     +--------+------------------+-----+----------+--------+ ATA 162 1.09 triphasic   +--------+------------------+-----+----------+--------+ PTA 91 0.61 monophasic  +--------+------------------+-----+----------+--------+ +---------+------------------+-----+----------+-------+ Left Lt Pressure (mmHg)IndexWaveform  Comment +---------+------------------+-----+----------+-------+ Brachial 144     +---------+------------------+-----+----------+-------+ ATA 137 0.93 monophasic  +---------+------------------+-----+----------+-------+ PTA 79 0.53 monophasic  +---------+------------------+-----+----------+-------+ Great Justin  Absent   [name] +---------+------------------+-----+----------+-------+ +-------+-----------+-----------+---------------+------------+ ABI/TBIT oday's ABIT oday's TBIPrevious ABI Previous TBI +-------+-----------+-----------+---------------+------------+ Right 1.09 amputation Noncompressibleamputation  +-------+-----------+-----------+---------------+------------+ Left 0.93 0.00 Noncompresible bandage  +-------+-----------+-----------+---------------+------------+ Bilateral ABIs appear decreased compared to prior study on 09/11/22. Summary: Right: Resting right ankle-brachial index is within normal range. Left: Resting left ankle-brachial index indicates mild left lower extremity arterial disease. The left toe-brachial index is abnormal. *See table(s) above for measurements and observations. Electronically signed by Heath Lark on 02/12/2023 at 5:08:02 PM. Electronic Signature(s) Signed: 03/26/2023 4:32:13 PM By: Justin Najjar MD Entered By: Justin Justin on 03/26/2023 11:26:36 Justin Justin ([id number]) [id number] Page 4 of 11 -------------------------------------------------------------------------------- Physical Exam Details Patient Name: Date of Service: Justin Justin, Justin Justin 03/26/2023 12:30 PM Medical Record Number: [id number] Patient Account Number: [id number] Date of Birth/Sex: Treating RN: May 24, 1934 (88 y.o. M) Primary Care Provider: [facility], [name] Other Clinician: Referring Provider: Treating Provider/Extender: Justin Justin, Justin Justin: 0 Constitutional Sitting or  standing Blood Pressure is within target range for patient.. Pulse regular and within target range for patient.Marland Kitchen Respirations regular, non-labored and within target range.. Temperature is normal and within the target range for the patient.Marland Kitchen Appears in no distress. Cardiovascular Popliteal pulses are palpable on the left. Absent pedal pulses bilaterally.. Notes Wound examination; the patient does not have any open wounds on the right the TMA looks fine. On the left he has a superficial wound over the tip of the left great toe. Eschar over 75% of the wound surface. More problematic wound on the lateral part of the third toe at the DIP. Completely necrotic feeling to this or perhaps some old dressing. I removed this with a #3 curette this goes straight to bone. Not sure how much viable tissue is here it is hard to see this area Electronic Signature(s) Signed: 03/26/2023 4:32:13 PM By: Justin Najjar MD Entered By: Justin Justin on 03/26/2023 11:22:41 -------------------------------------------------------------------------------- Physician Orders Details Patient Name: Date of Service: Justin Justin. 03/26/2023 12:30 PM Medical Record Number: [id number] Patient Account Number: [id number] Date of Birth/Sex: Treating RN: 1934/11/08 (88 y.o. [name] Primary Care Provider: [facility],  [name] Other Clinician: Referring Provider: Treating Provider/Extender: Justin Justin, Justin Justin: 0 Verbal / Phone Orders: No Diagnosis Coding Follow-up Appointments Return appointment in 3 weeks. - Dr. Leanord Hawking Anesthetic (In clinic) Topical Lidocaine 5% applied to wound bed Bathing/ Shower/ Hygiene May shower with protection but do not get wound dressing(s) wet. Protect dressing(s) with water repellant cover (for example, large plastic bag) or a cast cover and may then take shower. Edema Control - Orders / Instructions Elevate legs to the level of the heart or above  for 30 minutes daily and/or when sitting for 3-4 times a day throughout the day. Avoid standing for long periods of time. Wound Justin Wound #3 - Justin Justin Wound Laterality: Plantar, Left Cleanser: Soap and Water 1 x Per Day/30 Days Discharge Instructions: May shower and wash wound with dial antibacterial soap and water prior to dressing change. Cleanser: Wound Cleanser 1 x Per Day/30 Days Justin Justin, Justin Justin ([id number]) [id number] Page 5 of 11 Discharge Instructions: Cleanse the wound with wound cleanser prior to applying a clean dressing using gauze sponges, not tissue or cotton balls. Prim Dressing: Promogran Prisma Matrix, 4.34 (sq in) (silver collagen) 1 x Per Day/30 Days ary Discharge Instructions: Moisten collagen with saline or hydrogel Secondary Dressing: Woven Gauze Sponge, Non-Sterile 4x4 in 1 x Per Day/30 Days Discharge Instructions: Apply over primary dressing as directed. Secured With: [employer], 4.5x3.1 (in/yd) 1 x Per Day/30 Days Discharge Instructions: Secure with Kerlix as directed. Secured With: 65M Medipore H Soft Cloth Surgical Justin ape, 4 x 10 (in/yd) 1 x Per Day/30 Days Discharge Instructions: Secure with tape as directed. Wound #4 - Justin Third [name] Wound Laterality: Left, Medial Cleanser: Soap and Water 1 x Per Day/30 Days Discharge Instructions: May shower and wash wound with dial antibacterial soap and water prior to dressing change. Cleanser: Wound Cleanser 1 x Per Day/30 Days Discharge Instructions: Cleanse the wound with wound cleanser prior to applying a clean dressing using gauze sponges, not tissue or cotton balls. Prim Dressing: Promogran Prisma Matrix, 4.34 (sq in) (silver collagen) 1 x Per Day/30 Days ary Discharge Instructions: Moisten collagen with saline or hydrogel Secondary Dressing: Woven Gauze Sponge, Non-Sterile 4x4 in 1 x Per Day/30 Days Discharge Instructions: Apply over primary dressing as directed. Secured  With: [employer], 4.5x3.1 (in/yd) 1 x Per Day/30 Days Discharge Instructions: Secure with Kerlix as directed. Secured With: 65M Medipore H Soft Cloth Surgical Justin ape, 4 x 10 (in/yd) 1 x Per Day/30 Days Discharge Instructions: Secure with tape as directed. Electronic Signature(s) Signed: 03/26/2023 4:32:11 PM By: Justin Mu RN Signed: 03/26/2023 4:32:13 PM By: Justin Najjar MD Entered By: Justin Justin on 03/26/2023 10:38:09 -------------------------------------------------------------------------------- Problem List Details Patient Name: Date of Service: Justin Justin. 03/26/2023 12:30 PM Medical Record Number: [id number] Patient Account Number: [id number] Date of Birth/Sex: Treating RN: 1934-04-16 (88 y.o. M) Primary Care Provider: [facility], [name] Other Clinician: Referring Provider: Treating Provider/Extender: Justin Justin, Justin Justin: 0 Active Problems ICD-10 Encounter Code Description Active Date MDM Diagnosis E11.51 Type 2 diabetes mellitus with diabetic peripheral angiopathy without gangrene 03/26/2023 No Yes L97.524 Non-pressure chronic ulcer of other part of left foot with necrosis of bone 03/26/2023 No Yes L97.521 Non-pressure chronic ulcer of other part of left foot limited to breakdown of 03/26/2023 No Yes skin Justin Justin, Justin Justin ([id number]) [id number] Page 6 of 11 Inactive Problems Resolved Problems Electronic Signature(s) Signed: 03/26/2023 4:32:13 PM By:  Justin Najjar MD Entered By: Justin Justin on 03/26/2023 11:06:31 -------------------------------------------------------------------------------- Progress Note Details Patient Name: Date of Service: Justin Justin, Justin Justin 03/26/2023 12:30 PM Medical Record Number: [id number] Patient Account Number: [id number] Date of Birth/Sex: Treating RN: 06/04/1934 (88 y.o. M) Primary Care Provider: [facility], [name] Other Clinician: Referring  Provider: Treating Provider/Extender: Justin Justin, Justin Justin: 0 Subjective History of Present Illness (HPI) ADMISSION 12/19/2021 This is an 88 year old man with multiple medical problems including type 2 diabetes mellitus, chronic pancreatitis, coronary artery disease, peripheral vascular disease, stroke and TIA. He was admitted to the hospital on November 15, 2021 with a diabetic foot infection. An MRI was performed on admission that was questionable for early osteomyelitis. He was seen in consultation by both podiatry and vascular surgery. Due to limb threatening ischemia, he underwent a lower extremity angiogram with multiple angioplasties on August 18. He then had amputation of his right second toe on August 20. There was frank pus present which was cultured. He was placed on a course of oral antibiotics. He subsequently was discharged but returned due to altered mental status on September 2. Repeat MRI was performed that was again concerning for osteomyelitis. Infectious disease was consulted. A 4-week course of oral doxycycline and cefadroxil was prescribed. Podiatry referred him to the wound care center for further evaluation and management. On exam, there is a recent right second toe amputation site. The Prolene sutures are gaping and the wound has a fairly deep cavity; it does probe to bone. There is no purulent drainage or odor, but there is quite a bit of fat necrosis present. On the lateral aspect of his third toe, there is an ulcer that has a leathery eschar overlying it. Underneath, bone and tendon are exposed. No purulent drainage or malodor from this wound, either. 12/25/2021: There is still extensive slough and fat necrosis in the right second toe amputation site. The lateral third toe wound has also accumulated quite a bit of slough. Today he did not have the prescribed dressing in situ, just a bit of gauze over the wound sites. 01/01/2022: Patient's  facility paperwork today indicates that they are requesting a consultation for possible hyperbaric oxygen therapy. Unfortunately there are no other notes available to me from whomever made this request. There is still substantial slough and nonviable subcutaneous tissue present in both of the wounds. There is bone exposed in both sites. He continues on oral antibiotics per infectious disease. 01/08/2022: Both wounds are a bit cleaner today. It bone is still frankly exposed at both sites. The second toe amputation site is deeper, but this is likely secondary to the aggressive debridements I have been performing. We are in the process of evaluating him for hyperbaric oxygen therapy. He has been optimized from a vascular standpoint. His diabetes is suboptimally controlled, with his most recent hemoglobin A1c being 8.9% on December 10, 2021. He continues on oral antibiotics per infectious disease. 01/16/2022: Due to lack of daily transportation and patient's refusal of chest x-ray and EKG, the decision was made not to further pursue hyperbaric oxygen therapy. Both wounds are about the same size today, there is a little less slough in the amputation site. Bone remains exposed. 01/23/2022: The amputation site is quite a bit cleaner today, but still has some nonviable subcutaneous tissue and fibrinous slough present. The lateral wound on the third toe has gotten worse and I can put a probe all the way through the joint space and pushed through  the skin on the other side. No frank necrotic tissue, purulent drainage, or malodor. READMISSION 03/26/2023 This is a patient we had in the clinic in the fall 2023. At that point he had wounds on his right second toe and lateral right third toe. He went on to have a right TMA on 01/28/2022 by Dr. Allena Katz. And he has nothing open on the right foot. He has known significant PAD he had an angiogram on 11/24/2021 and underwent successful stenting of the right popliteal  artery, angioplasty of the tibioperoneal trunk and peroneal artery. He had only primary perfusion of the foot by the peroneal artery. Nevertheless he does not have anything open in the right foot. He has wounds now on the tip of the left first toe and the lateral part of the left third toe against the PIP and the third fourth webspace. It looks as though they have been using Betadine on these areas. In looking at the last notes by Dr. Allena Katz at podiatry he had an area on the left fourth toe I did not see this today but he did not mention the 1st through 3rd toes. He also has been followed by vascular surgery. On 02/12/2023 for new wounds on the left foot. They note ABIs on the right of 1.09 and on the left at 0.93. He was felt to have severe tibial disease and was nonambulatory. He was referred to [place] for review of the wounds. Past medical history includes PAD as noted, back surgery, cholecystectomy, neck surgery in 1985, temporal artery biopsy in 2012 transmetatarsal amputation in October 2023. I am not exactly sure why he is nonambulatory. There may be some cognitive loss as well Patient History Information obtained from Patient, Chart. Allergies Justin Justin, Justin Justin ([id number]) [id number] Page 7 of 11 No Known Allergies Family History Diabetes, Heart Disease - Mother, Hypertension - Mother,Siblings, No family history of Cancer, Hereditary Spherocytosis, Kidney Disease, Lung Disease, Seizures, Stroke, Thyroid Problems, Tuberculosis. Social History Former smoker, Marital Status - Married, Alcohol Use - Never, Drug Use - No History, Caffeine Use - Daily - coffee. Medical History Eyes Denies history of Cataracts, Glaucoma, Optic Neuritis Hematologic/Lymphatic Patient has history of Anemia Cardiovascular Patient has history of Coronary Artery Disease, Hypertension, Peripheral Arterial Disease Endocrine Patient has history of Type II Diabetes Denies history of Type I  Diabetes Genitourinary Denies history of End Stage Renal Disease Integumentary (Skin) Denies history of History of Burn Musculoskeletal Patient has history of Osteoarthritis Neurologic Denies history of Neuropathy Oncologic Denies history of Received Chemotherapy, Received Radiation Psychiatric Patient has history of Anorexia/bulimia - poor apetite Denies history of Confinement Anxiety Hospitalization/Surgery History - right 2nd toe amputation 11/26/21. - agram. - peripheral balloon angioplasty 11/24/21. - temporal artery biopsy. - back surgery. - neck surgery. - cholecystectomy. Medical A Surgical History Notes nd Gastrointestinal chronic pancreatitis Genitourinary CKD st 3, testosterone deficiency Neurologic CVA Objective Constitutional Sitting or standing Blood Pressure is within target range for patient.. Pulse regular and within target range for patient.Marland Kitchen Respirations regular, non-labored and within target range.. Temperature is normal and within the target range for the patient.Marland Kitchen Appears in no distress. Vitals Time Taken: 12:48 PM, Height: 74 in, Source: Stated, Weight: 147 lbs, BMI: 18.9, Temperature: 98.8 F, Pulse: 85 bpm, Respiratory Rate: 17 breaths/min, Blood Pressure: 130/77 mmHg. Cardiovascular Popliteal pulses are palpable on the left. Absent pedal pulses bilaterally.. General Notes: Wound examination; the patient does not have any open wounds on the right the TMA looks fine. On the left  he has a superficial wound over the tip of the left great toe. Eschar over 75% of the wound surface. More problematic wound on the lateral part of the third toe at the DIP. Completely necrotic feeling to this or perhaps some old dressing. I removed this with a #3 curette this goes straight to bone. Not sure how much viable tissue is here it is hard to see this area Integumentary (Hair, Skin) Wound #3 status is Open. Original cause of wound was Pressure Injury. The date acquired was:  03/26/2023. The wound is located on the [employer]. [name] The wound measures 2.1cm length x 2.8cm width x 0.1cm depth; 4.618cm^2 area and 0.462cm^3 volume. There is no tunneling or undermining noted. There is a medium amount of serosanguineous drainage noted. The wound margin is distinct with the outline attached to the wound base. There is large (67-100%) red, pink granulation within the wound bed. There is no necrotic tissue within the wound bed. The periwound skin appearance exhibited: Dry/Scaly. The periwound skin appearance did not exhibit: Callus, Crepitus, Excoriation, Induration, Rash, Scarring, Maceration, Atrophie Blanche, Cyanosis, Ecchymosis, Hemosiderin Staining, Mottled, Pallor, Rubor, Erythema. Periwound temperature was noted as No Abnormality. The periwound has tenderness on palpation. Wound #4 status is Open. Original cause of wound was Gradually Appeared. The date acquired was: 03/26/2023. The wound is located on the Left,Medial Justin [name] Third. The wound measures 1cm length x 0.9cm width x 0.2cm depth; 0.707cm^2 area and 0.141cm^3 volume. There is bone exposed. There is no tunneling or undermining noted. There is a medium amount of serosanguineous drainage noted. The wound margin is distinct with the outline attached to the wound base. There is no granulation within the wound bed. There is a large (67-100%) amount of necrotic tissue within the wound bed including Eschar and Adherent Slough. The periwound skin appearance exhibited: Dry/Scaly. The periwound skin appearance did not exhibit: Callus, Crepitus, Excoriation, Induration, Rash, Scarring, Maceration, Atrophie Blanche, Cyanosis, Ecchymosis, Hemosiderin Staining, Mottled, Pallor, Rubor, Erythema. Periwound temperature was noted as No Abnormality. The periwound has tenderness on palpation. 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 There was a Excisional Skin/Subcutaneous Tissue Debridement with a total area of 4.62 sq cm performed by Justin Justin., MD. With the following instrument(s): Curette to remove Viable and Non-Viable tissue/material. Material removed includes Subcutaneous Tissue and Slough and after achieving pain control using Lidocaine. No specimens were taken. A time out was conducted at 13:20, prior to the start of the procedure. A Minimum amount of bleeding was controlled with Pressure. The procedure was tolerated well with a pain level of 0 throughout and a pain level of 0 following the procedure. Post Debridement Measurements: 2.1cm length x 2.8cm width x 0.1cm depth; 0.462cm^3 volume. Character of Wound/Ulcer Post Debridement is improved. Severity of Tissue Post Debridement is: Fat layer exposed. Post procedure Diagnosis Wound #3: Same as Pre-Procedure Wound #4 Pre-procedure diagnosis of Wound #4 is an Arterial Insufficiency Ulcer located on the Left,Medial Justin Third .Severity of Tissue Pre Debridement is: Fat layer [name] exposed. There was a Excisional Skin/Subcutaneous Tissue  Debridement with a total area of 0.71 sq cm performed by Justin Justin., MD. With the following instrument(s): Curette to remove Viable and Non-Viable tissue/material. Material removed includes Subcutaneous Tissue and Slough and after achieving pain control using Lidocaine. No specimens were taken. A time out was conducted at 13:20, prior to the start of the procedure. A Minimum amount of bleeding was controlled with Pressure.  The procedure was tolerated well with a pain level of 0 throughout and a pain level of 0 following the procedure. Post Debridement Measurements: 1cm length x 0.9cm width x 0.2cm depth; 0.141cm^3 volume. Character of Wound/Ulcer Post Debridement is improved. Severity of Tissue Post Debridement is: Fat layer exposed. Post procedure Diagnosis Wound #4: Same as Pre-Procedure Plan Follow-up Appointments: Return appointment in 3 weeks. - Dr. Leanord Hawking Anesthetic: (In clinic) Topical Lidocaine 5% applied to wound bed Bathing/ Shower/ Hygiene: May shower with protection but do not get wound dressing(s) wet. Protect dressing(s) with water repellant cover (for example, large plastic bag) or a cast cover and may then take shower. Edema Control - Orders / Instructions: Elevate legs to the level of the heart or above for 30 minutes daily and/or when sitting for 3-4 times a day throughout the day. Avoid standing for long periods of time. WOUND #3: - Justin Great Wound Laterality: Plantar, Left [name] Cleanser: Soap and Water 1 x Per Day/30 Days Discharge Instructions: May shower and wash wound with dial antibacterial soap and water prior to dressing change. Cleanser: Wound Cleanser 1 x Per Day/30 Days Discharge Instructions: Cleanse the wound with wound cleanser prior to applying a clean dressing using gauze sponges, not tissue or cotton balls. Prim Dressing: Promogran Prisma Matrix, 4.34 (sq in) (silver collagen) 1 x Per Day/30 Days ary Discharge Instructions: Moisten collagen with saline or hydrogel Secondary Dressing: Woven Gauze Sponge, Non-Sterile 4x4 in 1 x Per Day/30 Days Discharge Instructions: Apply over primary dressing as directed. Secured With: [employer], 4.5x3.1 (in/yd) 1 x Per Day/30 Days Discharge Instructions: Secure with Kerlix as directed. Secured With: 69M Medipore H Soft Cloth Surgical Justin ape, 4 x 10 (in/yd) 1 x Per Day/30 Days Discharge Instructions: Secure with tape as  directed. WOUND #4: - Justin Third Wound Laterality: Left, Medial [name] Cleanser: Soap and Water 1 x Per Day/30 Days Discharge Instructions: May shower and wash wound with dial antibacterial soap and water prior to dressing change. Cleanser: Wound Cleanser 1 x Per Day/30 Days Discharge Instructions: Cleanse the wound with wound cleanser prior to applying a clean dressing using gauze sponges, not tissue or cotton balls. Prim Dressing: Promogran Prisma Matrix, 4.34 (sq in) (silver collagen) 1 x Per Day/30 Days ary Discharge Instructions: Moisten collagen with saline or hydrogel Secondary Dressing: Woven Gauze Sponge, Non-Sterile 4x4 in 1 x Per Day/30 Days Discharge Instructions: Apply over primary dressing as directed. Secured With: [employer], 4.5x3.1 (in/yd) 1 x Per Day/30 Days Discharge Instructions: Secure with Kerlix as directed. Secured With: 69M Medipore H Soft Cloth Surgical Justin ape, 4 x 10 (in/yd) 1 x Per Day/30 Days Discharge Instructions: Secure with tape as directed. 1. We used moistened silver collagen to both of these dry wounds. I think they have been using Betadine at [facility] although I do not use this for the purposes of healing. Justin Justin, Justin Justin ([id number]) [id number] Page 9 of 11 2. I am doubtful this patient has enough blood to heal wounds in the left foot. Also I do not think he has had any investigations  to this area. Vein and vascular referred him here for wound care and we will see if we can tease some healing out of the blood supply he has. 3. He does not seem to be in any pain nor is there significant amounts of tissue at risk right now. There is no reason for amputations 4. Order sent to [facility] we will see him again in 2 weeks Electronic Signature(s) Signed: 03/26/2023 4:32:13 PM By: Justin Najjar MD Entered By: Justin Justin on 03/26/2023  11:27:34 -------------------------------------------------------------------------------- HxROS Details Patient Name: Date of Service: Justin Justin. 03/26/2023 12:30 PM Medical Record Number: [id number] Patient Account Number: [id number] Date of Birth/Sex: Treating RN: [birth year] (88 y.o. [name] Primary Care Provider: MENZER, [name] Other Clinician: Referring Provider: Treating Provider/Extender: Justin Justin, Justin Justin: 0 Information Obtained From Patient Chart Eyes Medical History: Negative for: Cataracts; Glaucoma; Optic Neuritis Hematologic/Lymphatic Medical History: Positive for: Anemia Cardiovascular Medical History: Positive for: Coronary Artery Disease; Hypertension; Peripheral Arterial Disease Gastrointestinal Medical History: Past Medical History Notes: chronic pancreatitis Endocrine Medical History: Positive for: Type II Diabetes Negative for: Type I Diabetes Treated with: Insulin Blood sugar tested every day: Yes Tested : 2 times per day Genitourinary Medical History: Negative for: End Stage Renal Disease Past Medical History Notes: CKD st 3, testosterone deficiency Integumentary (Skin) Medical History: Negative for: History of Burn Musculoskeletal Medical History: Positive for: Osteoarthritis Justin Justin, Justin Justin ([id number]) [id number] Page 10 of 11 Neurologic Medical History: Negative for: Neuropathy Past Medical History Notes: CVA Oncologic Medical History: Negative for: Received Chemotherapy; Received Radiation Psychiatric Medical History: Positive for: Anorexia/bulimia - poor apetite Negative for: Confinement Anxiety Immunizations Pneumococcal Vaccine: Received Pneumococcal Vaccination: Yes Received Pneumococcal Vaccination On or After 60th Birthday: Yes Implantable Devices No devices added Hospitalization / Surgery History Type of Hospitalization/Surgery right  2nd toe amputation 11/26/21 [place] peripheral balloon angioplasty 11/24/21 temporal artery biopsy back surgery neck surgery cholecystectomy Family and Social History Cancer: No; Diabetes: Yes; Heart Disease: Yes - Mother; Hereditary Spherocytosis: No; Hypertension: Yes - Mother,Siblings; Kidney Disease: No; Lung Disease: No; Seizures: No; Stroke: No; Thyroid Problems: No; Tuberculosis: No; Former smoker; Marital Status - Married; Alcohol Use: Never; Drug Use: No History; Caffeine Use: Daily - coffee Social Determinants of Health (SDOH) 1. In the past 2 months, did you or others you live with eat smaller meals or skip meals because you didn'Justin have money for foodo : No 2. Are you homeless or worried that you might be in the futureo : No 3. Do you have trouble paying for your utilities (gas, electricity, phone)o : No 4. Do you have trouble finding or paying for a rideo : No 5. Do you need daycare, or better daycare, for your kidso : No 6. Are you unemployed or without regular incomeo : No 7. Do you need help finding a better jobo : No 8. Do you need help getting more educationo : No 9. Are you concerned about someone in your home using drugs or alcoholo : No 10. Do you feel unsafe in your daily lifeo : No 11. Is anyone in your home threatening or abusing youo : No 12. Do you lack quality relationships that make you feel valued and supportedo : No 13. Do you need help getting cultural information in a language you understando : No 14. Do you need help getting internet accesso : No Advanced Directives and Instructions Spiritual or Cultural beliefs preclude asking about Advance Care Planning: No  Advanced Directives: No Patient wants information on Advanced Directives: No Do not resuscitate: No Living Will: No Medical Power of Attorney: No Surrogate Decision Maker: No Electronic Signature(s) Signed: 03/26/2023 4:32:11 PM By: Justin Mu RN Signed: 03/26/2023 4:32:13 PM By: Justin Najjar MD Entered By: Justin Justin on 03/25/2023 08:16:30 Justin Justin ([id number]) [id number] Page 11 of 11 -------------------------------------------------------------------------------- SuperBill Details Patient Name: Date of Service: Justin Justin, WESLING 03/26/2023 Medical Record Number: [id number] Patient Account Number: [id number] Date of Birth/Sex: Treating RN: December 16, 1934 (88 y.o. [name] Primary Care Provider: MENZER, [name] Other Clinician: Referring Provider: Treating Provider/Extender: Justin Justin, Justin Justin: 0 Diagnosis Coding ICD-10 Codes Code Description E11.51 Type 2 diabetes mellitus with diabetic peripheral angiopathy without gangrene L97.524 Non-pressure chronic ulcer of other part of left foot with necrosis of bone L97.521 Non-pressure chronic ulcer of other part of left foot limited to breakdown of skin Facility Procedures : CPT4 Code: 36644034 Description: 74259 - WOUND CARE VISIT-LEV 5 EST PT Modifier: Quantity: 1 Physician Procedures : CPT4 Code Description Modifier [id number] - WC PHYS LEVEL 4 - EST PT ICD-10 Diagnosis Description E11.51 Type 2 diabetes mellitus with diabetic peripheral angiopathy without gangrene L97.524 Non-pressure chronic ulcer of other part of left foot with  necrosis of bone L97.521 Non-pressure chronic ulcer of other part of left foot limited to breakdown of skin Quantity: 1 Electronic Signature(s) Signed: 03/26/2023 4:32:13 PM By: Justin Najjar MD Previous Signature: 03/26/2023 1:55:26 PM Version By: Justin Mu RN Entered By: Justin Justin on 03/26/2023 11:28:13

## 2023-03-27 NOTE — Progress Notes (Signed)
Justin [name] [name] ([id number]) [id number] Page 1 of 4 Visit Report for 03/26/2023 Abuse Risk Screen Details Patient Name: Date of Service: Justin [name], Justin 03/26/2023 12:30 PM Medical Record Number: [id number] Patient Account Number: [id number] Date of Birth/Sex: Treating RN: 1935-01-04 (88 y.o. Justin [name] Primary Care Justin [name]: MENZER, FRIEDA Other Clinician: Referring Miliano Cotten: Treating Shakeyla Giebler/Extender: Meredith [place], [place] Justin [name]: 0 Abuse Risk Screen Items Answer ABUSE RISK SCREEN: Has anyone close to you tried to hurt or harm you recentlyo No Do you feel uncomfortable with anyone in your familyo No Has anyone forced you do things that you didnt want to doo No Electronic Signature(s) Signed: 03/26/2023 4:32:11 PM By: Fonnie Mu RN Entered By: Fonnie Mu on 03/26/2023 09:50:52 -------------------------------------------------------------------------------- Activities of Daily Living Details Patient Name: Date of Service: Justin [name], Justin 03/26/2023 12:30 PM Medical Record Number: [id number] Patient Account Number: [id number] Date of Birth/Sex: Treating RN: Jul 02, 1934 (88 y.o. Justin [name] Primary Care Tysin Salada: MENZER, FRIEDA Other Clinician: Referring Ksean Vale: Treating Windie Marasco/Extender: [place]eredith [place], [place] Justin [name]: 0 Activities of Daily Living Items Answer Activities of Daily Living (Please select one for each item) Drive Automobile Not Able [name] Medications ake Need Assistance Use [name] elephone Need Assistance Care for Appearance Need Assistance Use [name] oilet Need Assistance Bath / Shower Need Assistance Dress Self Need Assistance Feed Self Need Assistance Walk Need Assistance Get In / Out Bed Need Assistance Housework Need Assistance Prepare Meals Need Assistance Handle Money Need Assistance Shop for Self Need Assistance Electronic  Signature(s) Signed: 03/26/2023 4:32:11 PM By: Fonnie Mu RN Entered By: Fonnie Mu on 03/26/2023 09:51:29 Rennie Plowman ([id number]) [phone] [id number] Page 2 of 4 -------------------------------------------------------------------------------- Education Screening Details Patient Name: Date of Service: Justin [name], Justin 03/26/2023 12:30 PM Medical Record Number: [id number] Patient Account Number: [id number] Date of Birth/Sex: Treating RN: 1934-04-17 (88 y.o. Justin [name] Primary Care Cyann Venti: [facility], FRIEDA Other Clinician: Referring Marnie Fazzino: Treating Raziel Koenigs/Extender: [place]eredith [place], [place] Justin [name]: 0 Primary Learner Assessed: Patient Learning Preferences/Education Level/Primary Language Learning Preference: Explanation, Demonstration, Communication Board, Printed Material Highest Education Level: High School Preferred Language: English Cognitive Barrier Language Barrier: No Translator Needed: No Memory Deficit: Yes hx stroke Emotional Barrier: No Cultural/Religious Beliefs Affecting Medical Care: No Physical Barrier Impaired Vision: Yes Glasses Impaired Hearing: Yes Hearing Aid Decreased Hand dexterity: No Knowledge/Comprehension Knowledge Level: Medium Comprehension Level: Medium Ability to understand written instructions: Medium Ability to understand verbal instructions: Medium Motivation Anxiety Level: Calm Cooperation: Cooperative Education Importance: Denies Need Interest in Health Problems: Asks Questions Perception: Coherent Willingness to Engage in Self-Management Medium Activities: Readiness to Engage in Self-Management Medium Activities: Electronic Signature(s) Signed: 03/26/2023 4:32:11 PM By: Fonnie Mu RN Entered By: Fonnie Mu on 03/26/2023 09:52:16 -------------------------------------------------------------------------------- Fall Risk Assessment  Details Patient Name: Date of Service: Justin [name]. 03/26/2023 12:30 PM Medical Record Number: [id number] Patient Account Number: [id number] Date of Birth/Sex: Treating RN: [birth year] (88 y.o. Justin [name] Primary Care Fatin Bachicha: [facility], FRIEDA Other Clinician: Referring Amorina Doerr: Treating Nisha Dhami/Extender: [place], [place] Justin [name]: 0 Fall Risk Assessment Items Have you had 2 or more falls in the last 3 [address] No [name], Justin [name] ([id number]) [id number] Page 3 of 4 Have you had any fall that resulted in injury in the last 12 monthso 0 No FALLS RISK SCREEN History of falling - immediate or within 3 months 0  No Secondary diagnosis (Do you have 2 or more medical diagnoseso) 15 Yes Ambulatory aid None/bed rest/wheelchair/nurse 0 Yes Crutches/cane/walker 0 No Furniture 0 No Intravenous therapy Access/Saline/Heparin Lock 0 No Gait/Transferring Normal/ bed rest/ wheelchair 0 No Weak (short steps with or without shuffle, stooped but able to lift head while walking, may seek 10 Yes support from furniture) Impaired (short steps with shuffle, may have difficulty arising from chair, head down, impaired 0 No balance) Mental Status Oriented to own ability 0 No Electronic Signature(s) Signed: 03/26/2023 4:32:11 PM By: Fonnie Mu RN Entered By: Fonnie Mu on 03/26/2023 09:52:37 -------------------------------------------------------------------------------- Foot Assessment Details Patient Name: Date of Service: Justin [name]. 03/26/2023 12:30 PM Medical Record Number: [id number] Patient Account Number: [id number] Date of Birth/Sex: Treating RN: [birth year] (88 y.o. Justin [name] Primary Care Pearline Yerby: MENZER, FRIEDA Other Clinician: Referring Sheyann Sulton: Treating Abisai Deer/Extender: Meredith [place], [place] Justin [name]: 0 Foot Assessment Items Site Locations + =  Sensation present, - = Sensation absent, C = Callus, U = Ulcer R = Redness, W = Warmth, M = Maceration, PU = Pre-ulcerative lesion F = Fissure, S = Swelling, D = Dryness Assessment Right: Left: Other Deformity: No No Prior Foot Ulcer: No No Prior Amputation: No No Charcot Joint: No No Ambulatory Status: Non-ambulatory Assistance Device: Wheelchair Justin [name], Justin [name] ([id number]) [id number] Page 4 of 4 Gait: Surveyor, mining) Signed: 03/26/2023 4:32:11 PM By: Fonnie Mu RN Entered By: Fonnie Mu on 03/26/2023 10:01:12 -------------------------------------------------------------------------------- Nutrition Risk Screening Details Patient Name: Date of Service: Justin [name], Justin [name] 03/26/2023 12:30 PM Medical Record Number: [id number] Patient Account Number: [id number] Date of Birth/Sex: Treating RN: 11/15/1934 (88 y.o. Justin [name] Primary Care Namrata Dangler: MENZER, FRIEDA Other Clinician: Referring Brittini Brubeck: Treating Jazmine Longshore/Extender: Baltazar Najjar BA [place], [place] Justin [name]: 0 Height (in): 74 Weight (lbs): 147 Body Mass Index (BMI): 18.9 Nutrition Risk Screening Items Score Screening NUTRITION RISK SCREEN: I have an illness or condition that made me change the kind and/or amount of food I eat 0 No I eat fewer than two meals per day 0 No I eat few fruits and vegetables, or milk products 0 No I have three or more drinks of beer, liquor or wine almost every day 0 No I have tooth or mouth problems that make it hard for me to eat 0 No I don'[name] always have enough money to buy the food I need 0 No I eat alone most of the time 0 No I take three or more different prescribed or over-the-counter drugs a day 0 No Without wanting to, I have lost or gained 10 pounds in the last six months 0 No I am not always physically able to shop, cook and/or feed myself 0 No Nutrition Protocols Good Risk Protocol 0 No  interventions needed Moderate Risk Protocol High Risk Proctocol Risk Level: Good Risk Score: 0 Electronic Signature(s) Signed: 03/26/2023 4:32:11 PM By: Fonnie Mu RN Entered By: Fonnie Mu on 03/26/2023 09:52:43

## 2023-04-16 ENCOUNTER — Ambulatory Visit (HOSPITAL_BASED_OUTPATIENT_CLINIC_OR_DEPARTMENT_OTHER): Payer: Medicare Other | Admitting: Internal Medicine

## 2023-04-17 ENCOUNTER — Emergency Department (HOSPITAL_COMMUNITY): Payer: Medicare Other

## 2023-04-17 ENCOUNTER — Inpatient Hospital Stay (HOSPITAL_COMMUNITY)
Admission: EM | Admit: 2023-04-17 | Discharge: 2023-04-23 | DRG: 252 | Disposition: A | Discharge status: Skilled Nursing Facility | Payer: Medicare Other | Source: Skilled Nursing Facility | Attending: Internal Medicine | Admitting: Internal Medicine

## 2023-04-17 DIAGNOSIS — B964 Proteus (mirabilis) (morganii) as the cause of diseases classified elsewhere: Secondary | ICD-10-CM | POA: Diagnosis present

## 2023-04-17 DIAGNOSIS — Z833 Family history of diabetes mellitus: Secondary | ICD-10-CM

## 2023-04-17 DIAGNOSIS — N179 Acute kidney failure, unspecified: Secondary | ICD-10-CM | POA: Diagnosis present

## 2023-04-17 DIAGNOSIS — Z7982 Long term (current) use of aspirin: Secondary | ICD-10-CM

## 2023-04-17 DIAGNOSIS — Z89421 Acquired absence of other right toe(s): Secondary | ICD-10-CM

## 2023-04-17 DIAGNOSIS — Z7985 Long-term (current) use of injectable non-insulin antidiabetic drugs: Secondary | ICD-10-CM

## 2023-04-17 DIAGNOSIS — K861 Other chronic pancreatitis: Secondary | ICD-10-CM | POA: Diagnosis present

## 2023-04-17 DIAGNOSIS — E1152 Type 2 diabetes mellitus with diabetic peripheral angiopathy with gangrene: Secondary | ICD-10-CM | POA: Diagnosis not present

## 2023-04-17 DIAGNOSIS — R4182 Altered mental status, unspecified: Secondary | ICD-10-CM | POA: Diagnosis not present

## 2023-04-17 DIAGNOSIS — F0150 Vascular dementia without behavioral disturbance: Secondary | ICD-10-CM | POA: Diagnosis present

## 2023-04-17 DIAGNOSIS — E1165 Type 2 diabetes mellitus with hyperglycemia: Secondary | ICD-10-CM | POA: Diagnosis present

## 2023-04-17 DIAGNOSIS — E119 Type 2 diabetes mellitus without complications: Secondary | ICD-10-CM

## 2023-04-17 DIAGNOSIS — H9190 Unspecified hearing loss, unspecified ear: Secondary | ICD-10-CM | POA: Diagnosis present

## 2023-04-17 DIAGNOSIS — I10 Essential (primary) hypertension: Secondary | ICD-10-CM | POA: Diagnosis present

## 2023-04-17 DIAGNOSIS — E1169 Type 2 diabetes mellitus with other specified complication: Secondary | ICD-10-CM | POA: Diagnosis present

## 2023-04-17 DIAGNOSIS — Z993 Dependence on wheelchair: Secondary | ICD-10-CM

## 2023-04-17 DIAGNOSIS — E1140 Type 2 diabetes mellitus with diabetic neuropathy, unspecified: Secondary | ICD-10-CM | POA: Diagnosis present

## 2023-04-17 DIAGNOSIS — Z794 Long term (current) use of insulin: Secondary | ICD-10-CM

## 2023-04-17 DIAGNOSIS — R008 Other abnormalities of heart beat: Secondary | ICD-10-CM | POA: Diagnosis present

## 2023-04-17 DIAGNOSIS — G9341 Metabolic encephalopathy: Secondary | ICD-10-CM | POA: Diagnosis present

## 2023-04-17 DIAGNOSIS — Z87891 Personal history of nicotine dependence: Secondary | ICD-10-CM

## 2023-04-17 DIAGNOSIS — E1122 Type 2 diabetes mellitus with diabetic chronic kidney disease: Secondary | ICD-10-CM | POA: Diagnosis present

## 2023-04-17 DIAGNOSIS — E782 Mixed hyperlipidemia: Secondary | ICD-10-CM | POA: Diagnosis present

## 2023-04-17 DIAGNOSIS — I96 Gangrene, not elsewhere classified: Secondary | ICD-10-CM

## 2023-04-17 DIAGNOSIS — N3090 Cystitis, unspecified without hematuria: Secondary | ICD-10-CM | POA: Insufficient documentation

## 2023-04-17 DIAGNOSIS — I472 Ventricular tachycardia, unspecified: Secondary | ICD-10-CM | POA: Diagnosis present

## 2023-04-17 DIAGNOSIS — L97529 Non-pressure chronic ulcer of other part of left foot with unspecified severity: Secondary | ICD-10-CM | POA: Diagnosis present

## 2023-04-17 DIAGNOSIS — F0280 Dementia in other diseases classified elsewhere without behavioral disturbance: Secondary | ICD-10-CM | POA: Diagnosis present

## 2023-04-17 DIAGNOSIS — G9340 Encephalopathy, unspecified: Principal | ICD-10-CM

## 2023-04-17 DIAGNOSIS — N390 Urinary tract infection, site not specified: Secondary | ICD-10-CM | POA: Diagnosis present

## 2023-04-17 DIAGNOSIS — Z8249 Family history of ischemic heart disease and other diseases of the circulatory system: Secondary | ICD-10-CM

## 2023-04-17 DIAGNOSIS — M869 Osteomyelitis, unspecified: Secondary | ICD-10-CM | POA: Diagnosis present

## 2023-04-17 DIAGNOSIS — Z7401 Bed confinement status: Secondary | ICD-10-CM

## 2023-04-17 DIAGNOSIS — I70262 Atherosclerosis of native arteries of extremities with gangrene, left leg: Secondary | ICD-10-CM | POA: Diagnosis present

## 2023-04-17 DIAGNOSIS — E440 Moderate protein-calorie malnutrition: Secondary | ICD-10-CM | POA: Diagnosis present

## 2023-04-17 DIAGNOSIS — Z66 Do not resuscitate: Secondary | ICD-10-CM | POA: Diagnosis present

## 2023-04-17 DIAGNOSIS — J449 Chronic obstructive pulmonary disease, unspecified: Secondary | ICD-10-CM | POA: Diagnosis present

## 2023-04-17 DIAGNOSIS — E11621 Type 2 diabetes mellitus with foot ulcer: Secondary | ICD-10-CM | POA: Diagnosis present

## 2023-04-17 DIAGNOSIS — G309 Alzheimer's disease, unspecified: Secondary | ICD-10-CM | POA: Diagnosis present

## 2023-04-17 DIAGNOSIS — I69351 Hemiplegia and hemiparesis following cerebral infarction affecting right dominant side: Secondary | ICD-10-CM

## 2023-04-17 DIAGNOSIS — N1832 Chronic kidney disease, stage 3b: Secondary | ICD-10-CM | POA: Diagnosis present

## 2023-04-17 DIAGNOSIS — Z681 Body mass index (BMI) 19 or less, adult: Secondary | ICD-10-CM

## 2023-04-17 DIAGNOSIS — I129 Hypertensive chronic kidney disease with stage 1 through stage 4 chronic kidney disease, or unspecified chronic kidney disease: Secondary | ICD-10-CM | POA: Diagnosis present

## 2023-04-17 DIAGNOSIS — R29810 Facial weakness: Secondary | ICD-10-CM | POA: Diagnosis present

## 2023-04-17 DIAGNOSIS — Z79899 Other long term (current) drug therapy: Secondary | ICD-10-CM

## 2023-04-17 LAB — I-STAT CHEM 8, ED
BUN: 30 mg/dL — ABNORMAL HIGH (ref 8–23)
Calcium, Ion: 1.16 mmol/L (ref 1.15–1.40)
Chloride: 107 mmol/L (ref 98–111)
Creatinine, Ser: 1.7 mg/dL — ABNORMAL HIGH (ref 0.61–1.24)
Glucose, Bld: 200 mg/dL — ABNORMAL HIGH (ref 70–99)
HCT: 35 % — ABNORMAL LOW (ref 39.0–52.0)
Hemoglobin: 11.9 g/dL — ABNORMAL LOW (ref 13.0–17.0)
Potassium: 4.1 mmol/L (ref 3.5–5.1)
Sodium: 141 mmol/L (ref 135–145)
TCO2: 23 mmol/L (ref 22–32)

## 2023-04-17 LAB — DIFFERENTIAL
Abs Immature Granulocytes: 0.09 10*3/uL — ABNORMAL HIGH (ref 0.00–0.07)
Basophils Absolute: 0.1 10*3/uL (ref 0.0–0.1)
Basophils Relative: 1 %
Eosinophils Absolute: 0.6 10*3/uL — ABNORMAL HIGH (ref 0.0–0.5)
Eosinophils Relative: 6 %
Immature Granulocytes: 1 %
Lymphocytes Relative: 37 %
Lymphs Abs: 3.5 10*3/uL (ref 0.7–4.0)
Monocytes Absolute: 0.9 10*3/uL (ref 0.1–1.0)
Monocytes Relative: 10 %
Neutro Abs: 4.2 10*3/uL (ref 1.7–7.7)
Neutrophils Relative %: 45 %

## 2023-04-17 LAB — CBC
HCT: 33.9 % — ABNORMAL LOW (ref 39.0–52.0)
Hemoglobin: 11 g/dL — ABNORMAL LOW (ref 13.0–17.0)
MCH: 30.3 pg (ref 26.0–34.0)
MCHC: 32.4 g/dL (ref 30.0–36.0)
MCV: 93.4 fL (ref 80.0–100.0)
Platelets: 244 10*3/uL (ref 150–400)
RBC: 3.63 MIL/uL — ABNORMAL LOW (ref 4.22–5.81)
RDW: 16.7 % — ABNORMAL HIGH (ref 11.5–15.5)
WBC: 9.5 10*3/uL (ref 4.0–10.5)
nRBC: 0 % (ref 0.0–0.2)

## 2023-04-17 LAB — PROTIME-INR
INR: 1.1 (ref 0.8–1.2)
Prothrombin Time: 14.4 s (ref 11.4–15.2)

## 2023-04-17 LAB — APTT: aPTT: 29 s (ref 24–36)

## 2023-04-17 NOTE — ED Triage Notes (Signed)
 BIB EMS from facility called due to patient change in mental status. Pt was asleep when EMS arrived and pt states he just wants to go to sleep. Pt mental status is baseline A&O x 3    CBG 213

## 2023-04-17 NOTE — ED Notes (Signed)
 Pt daughter arrived to triage and stated that this is not patient baseline. He is normal more alert and talking. Pt was able to sit up and say daughters name. EDP made aware and in room to assess. Per daughter pt speech is slurred and right sided droop. Daughter states that pt was last normal 8pm when another sibling was visiting.

## 2023-04-17 NOTE — Consult Note (Signed)
 NEUROLOGY CONSULT NOTE   Date of service: April 17, 2023 Patient Name: Justin [name] MRN:  [id number] DOB:  May 23, 1934 Chief Complaint: Slurred speech, somnolence Requesting Provider: Ula Prentice SAUNDERS, MD  History of Present Illness  KENDEL BESSEY is a 89 y.o. 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  He was brought into the ED for change in mental status (lethargy).  Family is concerned for worsening right sided facial droop and worsening slurred speech.  They note that this is similar to his presentation in August for bleeding.  They are also concerned about his left great toe  Based on last neurology note his baseline NIH is a 4   LKW: 6 PM as witnessed by son, 8 PM per EMS report to facility  Modified rankin score: 4-Needs assistance to walk and tend to bodily needs IV Thrombolysis: No, GI bleeds in August, too mild to treat, non-disabling symptoms,  EVT: No, DNR/DNI, family/patient would not want aggressive procedures   NIHSS components Score: Comment  1a Level of Conscious 0[]  1[x]  2[]  3[]      1b LOC Questions 0[]  1[]  2[x]       1c LOC Commands 0[]  1[x]  2[]       2 Best Gaze 0[x]  1[]  2[]       3 Visual 0[x]  1[]  2[]  3[]     Inconsistent blink to threat bilaterally  4 Facial Palsy 0[]  1[x]  2[]  3[]      5a Motor Arm - left 0[x]  1[]  2[]  3[]  4[]  UN[]    5b Motor Arm - Right 0[x]  1[]  2[]  3[]  4[]  UN[]    6a Motor Leg -  Left 0[]  1[x]  2[]  3[]  4[]  UN[]    6b Motor Leg - Right 0[]  1[x]  2[]  3[]  4[]  UN[]    7 Limb Ataxia 0[x]  1[]  2[]  3[]  UN[]    Within limits of somnolence   8 Sensory 0[]  1[x]  2[]  UN[]      9 Best Language 0[]  1[x]  2[]  3[]     Limited by level of consciousness, reduced fluency   10 Dysarthria 0[]  1[x]  2[]  UN[]      11 Extinct. and Inattention 0[x]  1[]  2[]       TOTAL:       ROS  Unable to assess secondary to patient's mental status   Past History   Past Medical History:  Diagnosis Date   Acute metabolic encephalopathy 05/24/2021   Arthritis    Diabetes mellitus    HTN (hypertension)    Hypercholesteremia    Iron  deficiency    Pancreatitis, acute 06/02/2012   Diagnosed 05/2012    Personal history of colonic adenomas 04/15/2013   04/15/2013 2 diminutive polyps     Stroke [facility])    TIA (transient ischemic attack)     Past Surgical History:  Procedure Laterality Date   ABDOMINAL AORTOGRAM W/LOWER EXTREMITY Right 11/24/2021   Procedure: ABDOMINAL AORTOGRAM W/LOWER EXTREMITY;  Surgeon: Magda Debby SAILOR, MD;  Location: [facility] INVASIVE CV [facility];  Service: Cardiovascular;  Laterality: Right;   AMPUTATION TOE Right 11/26/2021   Procedure: AMPUTATION TOE;  Surgeon: Gershon,  Donnice SAUNDERS, DPM;  Location: [facility] OR;  Service: Podiatry;  Laterality: Right;   ARTERY BIOPSY  02/26/2011   Procedure: BIOPSY TEMPORAL ARTERY;  Surgeon: Elsie JINNY Signs, MD;  Location: [facility];  Service: General;  Laterality: Left;  Left temporal artery biospy   BACK SURGERY  1985   CHOLECYSTECTOMY     COLONOSCOPY     ESOPHAGOGASTRODUODENOSCOPY N/A 12/06/2022   Procedure: ESOPHAGOGASTRODUODENOSCOPY (EGD);  Surgeon: San Sandor GAILS, DO;  Location: [facility] ENDOSCOPY;  Service: Gastroenterology;  Laterality: N/A;   EUS     HEMOSTASIS CLIP PLACEMENT  12/06/2022   Procedure: HEMOSTASIS CLIP PLACEMENT;  Surgeon: San Sandor GAILS, DO;  Location: [facility] ENDOSCOPY;  Service: Gastroenterology;;   HOT HEMOSTASIS N/A 12/06/2022    Procedure: HOT HEMOSTASIS (ARGON PLASMA COAGULATION/BICAP);  Surgeon: San Sandor GAILS, DO;  Location: [facility] ENDOSCOPY;  Service: Gastroenterology;  Laterality: N/A;   NECK SURGERY  1985   PERIPHERAL VASCULAR BALLOON ANGIOPLASTY Right 11/24/2021   Procedure: PERIPHERAL VASCULAR BALLOON ANGIOPLASTY;  Surgeon: Magda Debby SAILOR, MD;  Location: [facility];  Service: Cardiovascular;  Laterality: Right;  POPLITEAL AND PERONEAL   TEMPORAL ARTERY BIOPSY / LIGATION  02/26/2011   left side   TRANSMETATARSAL AMPUTATION Right 01/28/2022   Procedure: TRANSMETATARSAL AMPUTATION; ACHILLES TENDON LENGTHENING;  Surgeon: Tobie Franky SQUIBB, DPM;  Location: [facility] ORS;  Service: Podiatry;  Laterality: Right;  R foot Transmetatarsal amputation, possible tendo achilles lengthening   UPPER GASTROINTESTINAL ENDOSCOPY      Family History: Family History  Problem Relation Age of Onset   Heart disease Mother    Amblyopia Father    Diabetes Sister        x 2   Heart attack Sister     Social History  reports that he quit smoking about 33 years ago. His smoking use included cigarettes. He started smoking about 68 years ago. He has never used smokeless tobacco. He reports that he does not drink alcohol  and does not use drugs.  No Known Allergies  Medications  No current facility-administered medications for this encounter.  Current Outpatient Medications:    acetaminophen  (TYLENOL ) 325 MG tablet, Take 650 mg by mouth in the morning., Disp: , Rfl:    Alcohol  Swabs  PADS, Use to check blood sugar 3 times daily. Dx: E10.9, Disp: 300 each, Rfl: 3   aspirin  81 MG chewable tablet, Chew 81 mg by mouth daily., Disp: , Rfl:    atorvastatin  (LIPITOR ) 40 MG tablet, Take 40 mg by mouth at bedtime., Disp: , Rfl:    Blood Glucose Calibration (ACCU-CHEK AVIVA) SOLN, Use to check blood sugar 3 times daily. Dx: E10.9, Disp: 1 each, Rfl: 3   Blood Glucose Monitoring Suppl (ACCU-CHEK AVIVA PLUS) w/Device KIT, Use to check blood  sugar 3 times daily. Dx: E10.9, Disp: 1 kit, Rfl: 0   BREO ELLIPTA  100-25 MCG/ACT AEPB, Inhale 1 puff into the lungs daily., Disp: , Rfl:    cefdinir  (OMNICEF ) 300 MG capsule, Take 1 capsule (300 mg total) by mouth every evening., Disp: , Rfl:    [employer] SALINE NOSE SPRAY NA, Place 2 sprays into the nose 2 (two) times daily., Disp: , Rfl:    divalproex  (DEPAKOTE  SPRINKLE) 125 MG capsule, Take 125 mg by mouth 2 (two) times daily., Disp: , Rfl:    docusate sodium  (COLACE) 100 MG capsule, Take 100 mg by mouth 2 (two) times daily., Disp: , Rfl:    doxycycline  (VIBRA -TABS) 100 MG tablet, Take 1 tablet (100 mg  total) by mouth 2 (two) times daily., Disp: 180 tablet, Rfl: 0   Dulaglutide 1.5 MG/0.5ML SOPN, Inject 1.5 mg into the skin once a week. tuesday, Disp: , Rfl:    ferrous gluconate  (FERGON) 324 MG tablet, Take 324 mg by mouth daily., Disp: , Rfl:    gabapentin  (NEURONTIN ) 100 MG capsule, Take 100 mg by mouth 2 (two) times daily., Disp: , Rfl:    glucagon  (GLUCAGON  EMERGENCY) 1 MG injection, Inject 1 mg into the muscle once as needed. (Patient taking differently: Inject 1 mg into the muscle once as needed (for low blood sugar).), Disp: 1 each, Rfl: 12   glucose blood (ACCU-CHEK AVIVA PLUS) test strip, Use to check blood sugar 3 times daily. Dx: E10.9, Disp: 300 each, Rfl: 3   hydrOXYzine  (ATARAX ) 10 MG tablet, Take 10 mg by mouth 2 (two) times daily. hold for sedation, Disp: , Rfl:    insulin  glargine, 2 Unit Dial, (TOUJEO  MAX SOLOSTAR) 300 UNIT/ML Solostar Pen, Inject 19 Units into the skin daily., Disp: , Rfl:    Insulin  Pen Needle 32G X 4 MM MISC, Use as directed to inject insulin  tidwc, Disp: 100 each, Rfl: 11   Ipratropium-Albuterol  (COMBIVENT) 20-100 MCG/ACT AERS respimat, Inhale 1 puff into the lungs every 6 (six) hours as needed for wheezing. (Patient taking differently: Inhale 1 puff into the lungs every 6 (six) hours as needed (for COPD).), Disp: , Rfl:    isosorbide  mononitrate (ISMO ) 10 MG  tablet, Take 10 mg by mouth at bedtime., Disp: , Rfl:    Lancets (ACCU-CHEK SOFT TOUCH) lancets, Use to check blood sugar 3 times daily. Dx: E10.9, Disp: 300 each, Rfl: 3   loratadine  (CLARITIN ) 10 MG tablet, Take 10 mg by mouth daily., Disp: , Rfl:    NOVOLOG  FLEXPEN 100 UNIT/ML FlexPen, Inject 0-8 Units into the skin See admin instructions. Inject 0-8 units into the skin before lunch and supper, per sliding scale: if is BS 70-200 give 0 units, if BS is 201-250 give 2 units, if BS is 251-300 give 4 units, if BS is 301-350 give 6 units, if BS is 351-400 give 8 units. If blood sugar is greater than 400, call MD., Disp: , Rfl:    oxycodone  (OXY-IR) 5 MG capsule, Take 1 capsule (5 mg total) by mouth every 8 (eight) hours as needed for pain., Disp: 10 capsule, Rfl: 0   oxymetazoline  (AFRIN) 0.05 % nasal spray, Place 2 sprays into both nostrils 2 (two) times daily as needed for congestion., Disp: , Rfl:    Pancrelipase , Lip-Prot-Amyl, (CREON ) 24000-76000 units CPEP, Take 1 capsule by mouth 3 (three) times daily. With 4-6 ounces of water, Disp: , Rfl:    pantoprazole  (PROTONIX ) 40 MG tablet, Take 1 tablet (40 mg total) by mouth daily., Disp: , Rfl:    Pollen Extracts (PROSTAT PO), Take 30 mLs by mouth 3 (three) times daily., Disp: , Rfl:    repaglinide (PRANDIN) 0.5 MG tablet, Take 0.5 mg by mouth 2 (two) times daily before a meal., Disp: , Rfl:    sertraline  (ZOLOFT ) 25 MG tablet, Take 25 mg by mouth daily., Disp: , Rfl:    Skin Protectants, Misc. (MINERIN CREME EX), Apply 1 Application topically daily. Both legs and feet., Disp: , Rfl:    Spacer/Aero-Holding Chambers DEVI, Use as directed with combivent. Ok to dispense any spacer that works with the inhaler being rxed to pt., Disp: 1 each, Rfl: 1  Vitals   Vitals:   04/17/23 2158  BP: (!) 150/71  Pulse: 74  Resp: 18  Temp: 97.7 F (36.5 C)  TempSrc: Axillary  SpO2: 99%    There is no height or weight on file to calculate BMI.  Physical Exam    Constitutional: Appears frail, chronically ill Psych: Affect appropriate to situation.  Eyes: No scleral injection.  HENT: No OP obstruction.  Head: Normocephalic.  Cardiovascular: Normal rate and regular rhythm on monitor Respiratory: Effort normal, non-labored breathing.  GI: Soft.  No distension. There is no tenderness.  Musculoskeletal/skin: Necrotic skin changes of the left great toe.  S/p amputations of all of the right toes  Neurologic Examination   Neuro: Mental Status: Patient is very sleepy, able to name some simple things when awakened with repeated stimulus, mostly perseverates on complaining that he is cold.  Disoriented to age and month (53 I don't know) Cranial Nerves: II: Unreliable blink to threat,  III,IV, VI: Orients to examiner bilaterally V: Facial sensation is symmetric to eyelash brush  VII: Facial movement with mild right facial droop at rest, nearly symmetric with activation (on grimace) VIII: hearing is intact to voice Motor: No drift in bilateral upper extremities.  Slight drift in bilateral lower extremities. Sensory: Patient reporting symmetric sensation (prior note of reduced sensation in the right upper extremity but he denies this on exam today) Cerebellar: FNF and HKS are intact within limits of somnolence  NIHSS total 9    Labs/Imaging/Neurodiagnostic studies    Basic Metabolic Panel: Recent Labs  Lab 04/17/23 2328  NA 141  K 4.1  CL 107  GLUCOSE 200*  BUN 30*  CREATININE 1.70*  Baseline creatinine 1.4-1.8  CBC: Recent Labs  Lab 04/17/23 2328 04/17/23 2330  WBC  --  9.5  NEUTROABS  --  4.2  HGB 11.9* 11.0*  HCT 35.0* 33.9*  MCV  --  93.4  PLT  --  244    Lipid Panel:  Lab Results  Component Value Date   LDLCALC 46 11/25/2021   HgbA1c:  Lab Results  Component Value Date   HGBA1C 6.2 (H) 11/07/2022   INR  Lab Results  Component Value Date   INR 1.0 12/04/2022   APTT  Lab Results  Component Value Date    APTT 33 05/21/2021   AED levels: No results found for: PHENYTOIN, ZONISAMIDE, LAMOTRIGINE, LEVETIRACETA  CT Head without contrast(Personally reviewed): No acute intracranial process   ASSESSMENT   ROSHAN ROBACK is a 89 y.o. male  has a past medical history of Acute metabolic encephalopathy (05/24/2021), Arthritis, Diabetes mellitus, HTN (hypertension), Hypercholesteremia, Iron  deficiency, Pancreatitis, acute (06/02/2012), Personal history of colonic adenomas (04/15/2013), Stroke ([name]), and TIA (transient ischemic attack).    Overall his examination is consistent with his known deficits with overlay of lethargy also leading to some dysarthria.  Most likely toxic/metabolic/infectious cause.  Will obtain MRI brain to screen for stroke.  RECOMMENDATIONS  -Toxic/metabolic/infectious workup per ED/primary team -MRI brain without contrast, if this is positive for stroke, continue home aspirin , hold off on Plavix  pending full evaluation given recent GI bleed, obtain A1c/lipid panel, echocardiogram, MRA head, carotid ultrasound, permissive hypertension to 220/110, PT/OT/SLP evaluation and stroke team to follow -if this is negative for stroke no further inpatient neurological workup is needed and inpatient neurology will sign off ______________________________________________________________________   Lola Jernigan MD-PhD [facility] [phone]

## 2023-04-17 NOTE — ED Provider Notes (Signed)
 [place] EMERGENCY DEPARTMENT AT [facility] Provider Note   CSN: [id number] Arrival date & time: 04/17/23  2155  An emergency department physician performed an initial assessment on this suspected stroke patient at 2245.  History  Chief Complaint  Patient presents with   Code Stroke    JEREN DUFRANE is a 89 y.o. male.  89 year old male with past medical history of diabetes, hypertension, hyperlipidemia, and CVA in the past with right-sided deficits that have since improved presenting to the emergency department today with concern for dysarthria, somnolence, and right-sided facial droop.  The patient's family is here with him and provide all of the history.  They report that their dad normally lives at a nursing facility and is normally awake and alert is able to talk without any difficulty.  He was with her brother at 3 PM and was in his normal state of health.  Apparently when nursing staff went in to check on the patient they noted that he was dysarthric and hard to arouse.  He was sent to the emergency department at that time for further evaluation.  The patient is wheelchair-bound at baseline.  The patient's family reports that he has not been on any new medications.  They do not think that is the only blood thinners.        Home Medications Prior to Admission medications   Medication Sig Start Date End Date Taking? Authorizing Provider  acetaminophen  (TYLENOL ) 325 MG tablet Take 650 mg by mouth in the morning.    [provider]  Alcohol  Swabs  PADS Use to check blood sugar 3 times daily. Dx: E10.9 07/21/15   Isadora Krabbe D, PA  aspirin  81 MG chewable tablet Chew 81 mg by mouth daily.    [provider]  atorvastatin  (LIPITOR ) 40 MG tablet Take 40 mg by mouth at bedtime. 11/03/21   [provider]  Blood Glucose Calibration (ACCU-CHEK AVIVA) SOLN Use to check blood sugar 3 times daily. Dx: E10.9 07/21/15   Isadora Krabbe D, PA   Blood Glucose Monitoring Suppl (ACCU-CHEK AVIVA PLUS) w/Device KIT Use to check blood sugar 3 times daily. Dx: E10.9 09/27/15   Claudene Rayfield HERO, MD  BREO ELLIPTA  100-25 MCG/ACT AEPB Inhale 1 puff into the lungs daily. 09/14/22   [provider]  cefdinir  (OMNICEF ) 300 MG capsule Take 1 capsule (300 mg total) by mouth every evening. 11/09/22   Danton Reyes ONEIDA, MD  [employer] SALINE NOSE SPRAY NA Place 2 sprays into the nose 2 (two) times daily.    [provider]  divalproex  (DEPAKOTE  SPRINKLE) 125 MG capsule Take 125 mg by mouth 2 (two) times daily. 10/27/22   [provider]  docusate sodium  (COLACE) 100 MG capsule Take 100 mg by mouth 2 (two) times daily.    [provider]  doxycycline  (VIBRA -TABS) 100 MG tablet Take 1 tablet (100 mg total) by mouth 2 (two) times daily. 02/06/23   Tobie Franky SQUIBB, DPM  Dulaglutide 1.5 MG/0.5ML SOPN Inject 1.5 mg into the skin once a week. tuesday    [provider]  ferrous gluconate  (FERGON) 324 MG tablet Take 324 mg by mouth daily.    [provider]  gabapentin  (NEURONTIN ) 100 MG capsule Take 100 mg by mouth 2 (two) times daily. 10/29/22   [provider]  glucagon  (GLUCAGON  EMERGENCY) 1 MG injection Inject 1 mg into the muscle once as needed. Patient taking differently: Inject 1 mg into the muscle once as needed (for  low blood sugar). 09/29/15   Trixie File, MD  glucose blood (ACCU-CHEK AVIVA PLUS) test strip Use to check blood sugar 3 times daily. Dx: E10.9 11/17/15   Trixie File, MD  hydrOXYzine  (ATARAX ) 10 MG tablet Take 10 mg by mouth 2 (two) times daily. hold for sedation    [provider]  insulin  glargine, 2 Unit Dial, (TOUJEO  MAX SOLOSTAR) 300 UNIT/ML Solostar Pen Inject 19 Units into the skin daily.    [provider]  Insulin  Pen Needle 32G X 4 MM MISC Use as directed to inject insulin  tidwc 12/14/12   Loreli Elyn SAILOR, MD  Ipratropium-Albuterol  (COMBIVENT) 20-100 MCG/ACT AERS  respimat Inhale 1 puff into the lungs every 6 (six) hours as needed for wheezing. Patient taking differently: Inhale 1 puff into the lungs every 6 (six) hours as needed (for COPD). 05/25/21   Bryn Bernardino NOVAK, MD  isosorbide  mononitrate (ISMO ) 10 MG tablet Take 10 mg by mouth at bedtime.    [provider]  Lancets (ACCU-CHEK SOFT TOUCH) lancets Use to check blood sugar 3 times daily. Dx: E10.9 07/21/15   Isadora Krabbe D, PA  loratadine  (CLARITIN ) 10 MG tablet Take 10 mg by mouth daily.    [provider]  NOVOLOG  FLEXPEN 100 UNIT/ML FlexPen Inject 0-8 Units into the skin See admin instructions. Inject 0-8 units into the skin before lunch and supper, per sliding scale: if is BS 70-200 give 0 units, if BS is 201-250 give 2 units, if BS is 251-300 give 4 units, if BS is 301-350 give 6 units, if BS is 351-400 give 8 units. If blood sugar is greater than 400, call MD. 10/18/21   [provider]  oxycodone  (OXY-IR) 5 MG capsule Take 1 capsule (5 mg total) by mouth every 8 (eight) hours as needed for pain. 12/08/22   Gherghe, Costin M, MD  oxymetazoline  (AFRIN) 0.05 % nasal spray Place 2 sprays into both nostrils 2 (two) times daily as needed for congestion.    [provider]  Pancrelipase , Lip-Prot-Amyl, (CREON ) 24000-76000 units CPEP Take 1 capsule by mouth 3 (three) times daily. With 4-6 ounces of water    [provider]  pantoprazole  (PROTONIX ) 40 MG tablet Take 1 tablet (40 mg total) by mouth daily. 12/08/22 12/08/23  Gherghe, Costin M, MD  Pollen Extracts (PROSTAT PO) Take 30 mLs by mouth 3 (three) times daily.    [provider]  repaglinide (PRANDIN) 0.5 MG tablet Take 0.5 mg by mouth 2 (two) times daily before a meal. 10/15/22   [provider]  sertraline  (ZOLOFT ) 25 MG tablet Take 25 mg by mouth daily.    [provider]  Skin Protectants, Misc. (MINERIN CREME EX) Apply 1 Application topically daily. Both legs and feet.     [provider]  Spacer/Aero-Holding Raguel FRENCH Use as directed with combivent. Ok to dispense any spacer that works with the inhaler being rxed to pt. 09/20/15   Loreli Elyn SAILOR, MD      Allergies    Patient has no known allergies.    Review of Systems   Review of Systems  Reason unable to perform ROS: ROS unobtainable due to mental status.    Physical Exam Updated Vital Signs BP (!) 150/71 (BP Location: Right Arm)   Pulse 74   Temp 97.7 F (36.5 C) (Axillary)   Resp 18   SpO2 99%  Physical Exam Vitals and nursing note reviewed.   Gen: Elderly male, somnolent, arousable to loud  verbal and painful stimuli Eyes: PERRL, EOMI HEENT: no oropharyngeal swelling Neck: trachea midline Resp: clear to auscultation bilaterally Card: RRR, no murmurs, rubs, or gallops Abd: nontender, nondistended Extremities: no calf tenderness, no edema Vascular: 2+ radial pulses bilaterally, 2+ DP pulses bilaterally Neuro: Right-sided facial droop noted, dysarthria noted, the patient's limbs fall to the stretcher with no effort against gravity but when I apply painful stimuli the patient will move his upper extremities as well as his lower extremities and strength does seem to be equal Skin: no rashes Psyc: acting appropriately   ED Results / Procedures / Treatments   Labs (all labs ordered are listed, but only abnormal results are displayed) Labs Reviewed  CBC - Abnormal; Notable for the following components:      Result Value   RBC 3.63 (*)    Hemoglobin 11.0 (*)    HCT 33.9 (*)    RDW 16.7 (*)    All other components within normal limits  DIFFERENTIAL - Abnormal; Notable for the following components:   Eosinophils Absolute 0.6 (*)    Abs Immature Granulocytes 0.09 (*)    All other components within normal limits  I-STAT CHEM 8, ED - Abnormal; Notable for the following components:   BUN 30 (*)    Creatinine, Ser 1.70 (*)    Glucose, Bld 200 (*)    Hemoglobin 11.9 (*)    HCT 35.0  (*)    All other components within normal limits  PROTIME-INR  APTT  COMPREHENSIVE METABOLIC PANEL  URINALYSIS, ROUTINE W REFLEX MICROSCOPIC  ETHANOL  RAPID URINE DRUG SCREEN, HOSP PERFORMED  CBG MONITORING, ED    EKG EKG Interpretation Date/Time:  Wednesday April 17 2023 23:21:38 EST Ventricular Rate:  69 PR Interval:  155 QRS Duration:  90 QT Interval:  422 QTC Calculation: 453 R Axis:   37  Text Interpretation: Sinus rhythm Normal ECG Confirmed by Haze Lonni PARAS [phone]) on 04/17/2023 11:30:22 PM  Radiology CT HEAD CODE STROKE WO CONTRAST Result Date: 04/17/2023 CLINICAL DATA:  Code stroke. Initial evaluation for acute neuro deficit, left facial droop. EXAM: CT HEAD WITHOUT CONTRAST TECHNIQUE: Contiguous axial images were obtained from the base of the skull through the vertex without intravenous contrast. RADIATION DOSE REDUCTION: This exam was performed according to the departmental dose-optimization program which includes automated exposure control, adjustment of the mA and/or kV according to patient size and/or use of iterative reconstruction technique. COMPARISON:  MRI from 01/17/2023. FINDINGS: Brain: Advanced age-related cerebral atrophy with chronic small vessel ischemic disease. Few scattered remote cerebellar infarcts noted. No acute intracranial hemorrhage. No acute large vessel territory infarct. No mass lesion or midline shift. Mild ventricular prominence related global parenchymal volume loss of hydrocephalus. No extra-axial fluid collection. Vascular: No abnormal hyperdense vessel. Calcified atherosclerosis present about the skull base. Skull: Scalp soft tissues within normal limits.  Calvarium intact. Sinuses/Orbits: Globes orbital soft tissues demonstrate no acute finding. Ocular senescent calcifications noted. Mild to moderate mucosal thickening present about the sphenoethmoidal and maxillary sinuses. Few superimposed air-fluid levels noted. Mastoid air cells are  clear. Other: None. ASPECTS [facility] Stroke Program Early CT Score) - Ganglionic level infarction (caudate, lentiform nuclei, internal capsule, insula, M1-M3 cortex): 7 - Supraganglionic infarction (M4-M6 cortex): 3 Total score (0-10 with 10 being normal): 10 IMPRESSION: 1. No acute intracranial abnormality. 2. ASPECTS is 10. 3. Advanced age-related cerebral atrophy with chronic small vessel ischemic disease. Few scatter remote cerebellar infarcts. 4. Mucosal thickening with a few scattered air-fluid levels within the  paranasal sinuses. Clinical correlation for possible acute sinusitis recommended. These results were communicated to Dr. Jerrie at 11:03 pm on 04/17/2023 by text page via the [facility] messaging system. Electronically Signed   By: Morene Hoard M.D.   On: 04/17/2023 23:04    Procedures Procedures    Medications Ordered in ED Medications - No data to display  ED Course/ Medical Decision Making/ A&P                                 Medical Decision Making 89 year old male with past medical history of diabetes, hypertension, and hyperlipidemia as well as CVA in the past presenting to the emergency department today with right-sided facial droop and dysarthria.  The patient's family states that this is a drastic change from his baseline.  I will initiate a code stroke as the patient's last known normal was at 8 PM and he would be inside the window for therapy.  Totally confident that his symptoms are due to CVA at this time.  I will reevaluate the patient after his initial workup and evaluation by our stroke team but he will require admission.  The patient was evaluated by neurology.  They felt this was likely worsening of his known issues from previous strokes secondary to toxic/metabolic encephalopathy.  They did recommend MRI to evaluate for stroke.  If there is a stroke on MRI I recommended full workup for this.  The patient's family did point out that he does have a lesion on his left  first toe that has come up over the past few weeks.  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  While he is getting his MRI of his brain will obtain an MRI of his foot to evaluate for osteomyelitis as a potential source for encephalopathy.  Initial labs are reassuring.  Imaging studies are pending at time of signout.  Plan is for admission for acute encephalopathy after initial workup is complete.  Amount and/or Complexity of Data Reviewed Labs: ordered. Radiology: ordered.   CRITICAL CARE Performed by: Prentice JONELLE Medicus   Total critical care time: 40 minutes  Critical care time was exclusive of separately billable procedures and treating other patients.  Critical care was necessary to treat or prevent imminent or life-threatening deterioration.  Critical care was time spent personally by me on the following activities: development of treatment plan with patient and/or surrogate as well as nursing, discussions with consultants, evaluation of patient's response to treatment, examination of patient, obtaining history from patient or surrogate, ordering and performing treatments and interventions, ordering and review of laboratory studies, ordering and review of radiographic studies, pulse oximetry and re-evaluation of patient's condition.         Final Clinical Impression(s) / ED Diagnoses Final diagnoses:  Encephalopathy, unspecified type    Rx / DC Orders ED Discharge Orders     None         Medicus Prentice JONELLE, MD 04/17/23 2352

## 2023-04-17 NOTE — ED Provider Triage Note (Signed)
 Emergency Medicine Provider Triage Evaluation Note  Justin [name] , a 89 y.o. male  was evaluated in triage.  family complains of AMS. Pt with hx of prior stroke on ASA.  Last known normal 3 hrs ago.  Pt normally is communicative.  He's now only mumbling when trying to talk according to family members at bedside.  Now he's sleeping and hard to arouse.  Hx limited.  Pt is DNR/DNI.    Review of Systems  Positive: Level V caveat Negative: Level V caveat  Physical Exam  BP (!) 150/71 (BP Location: Right Arm)   Pulse 74   Temp 97.7 F (36.5 C) (Axillary)   Resp 18   SpO2 99%  Gen:   sleeping Resp:  Normal effort MSK:   Not moving extremities Other:  Hard to arouse  Medical Decision Making  Medically screening exam initiated at 10:33 PM.  Appropriate orders placed.  Justin [name] was informed that the remainder of the evaluation will be completed by another provider, this initial triage assessment does not replace that evaluation, and the importance of remaining in the ED until their evaluation is complete.  Concerns for stroke.  Care discussed with DR. Tee.  Will activate code stroke    Nivia Colon, DEVONNA 04/17/23 2242

## 2023-04-18 ENCOUNTER — Other Ambulatory Visit: Payer: Self-pay

## 2023-04-18 ENCOUNTER — Observation Stay (HOSPITAL_COMMUNITY): Payer: Medicare Other

## 2023-04-18 ENCOUNTER — Emergency Department (HOSPITAL_COMMUNITY): Payer: Medicare Other

## 2023-04-18 DIAGNOSIS — N3000 Acute cystitis without hematuria: Secondary | ICD-10-CM

## 2023-04-18 DIAGNOSIS — G9341 Metabolic encephalopathy: Secondary | ICD-10-CM

## 2023-04-18 DIAGNOSIS — Z515 Encounter for palliative care: Secondary | ICD-10-CM | POA: Diagnosis not present

## 2023-04-18 DIAGNOSIS — Z7189 Other specified counseling: Secondary | ICD-10-CM | POA: Diagnosis not present

## 2023-04-18 DIAGNOSIS — N3090 Cystitis, unspecified without hematuria: Secondary | ICD-10-CM | POA: Diagnosis not present

## 2023-04-18 LAB — COMPREHENSIVE METABOLIC PANEL
ALT: 14 U/L (ref 0–44)
AST: 22 U/L (ref 15–41)
Albumin: 3.1 g/dL — ABNORMAL LOW (ref 3.5–5.0)
Alkaline Phosphatase: 75 U/L (ref 38–126)
Anion gap: 8 (ref 5–15)
BUN: 27 mg/dL — ABNORMAL HIGH (ref 8–23)
CO2: 22 mmol/L (ref 22–32)
Calcium: 8.9 mg/dL (ref 8.9–10.3)
Chloride: 106 mmol/L (ref 98–111)
Creatinine, Ser: 1.64 mg/dL — ABNORMAL HIGH (ref 0.61–1.24)
GFR, Estimated: 40 mL/min — ABNORMAL LOW (ref >60)
Glucose, Bld: 203 mg/dL — ABNORMAL HIGH (ref 70–99)
Potassium: 4 mmol/L (ref 3.5–5.1)
Sodium: 136 mmol/L (ref 135–145)
Total Bilirubin: 0.4 mg/dL (ref 0.0–1.2)
Total Protein: 8.8 g/dL — ABNORMAL HIGH (ref 6.5–8.1)

## 2023-04-18 LAB — LIPID PANEL
Cholesterol: 118 mg/dL (ref 0–200)
Cholesterol: 114 mg/dL (ref 0–200)
HDL: 55 mg/dL (ref >40)
HDL: 53 mg/dL (ref >40)
LDL Cholesterol: 53 mg/dL (ref 0–99)
LDL Cholesterol: 44 mg/dL (ref 0–99)
Total CHOL/HDL Ratio: 2.1 {ratio}
Total CHOL/HDL Ratio: 2.2 {ratio}
Triglycerides: 49 mg/dL (ref <150)
Triglycerides: 83 mg/dL (ref <150)
VLDL: 10 mg/dL (ref 0–40)
VLDL: 17 mg/dL (ref 0–40)

## 2023-04-18 LAB — CBC
HCT: 33 % — ABNORMAL LOW (ref 39.0–52.0)
Hemoglobin: 10.6 g/dL — ABNORMAL LOW (ref 13.0–17.0)
MCH: 30.4 pg (ref 26.0–34.0)
MCHC: 32.1 g/dL (ref 30.0–36.0)
MCV: 94.6 fL (ref 80.0–100.0)
Platelets: 243 10*3/uL (ref 150–400)
RBC: 3.49 MIL/uL — ABNORMAL LOW (ref 4.22–5.81)
RDW: 16.4 % — ABNORMAL HIGH (ref 11.5–15.5)
WBC: 9.6 10*3/uL (ref 4.0–10.5)
nRBC: 0 % (ref 0.0–0.2)

## 2023-04-18 LAB — BASIC METABOLIC PANEL
Anion gap: 9 (ref 5–15)
BUN: 25 mg/dL — ABNORMAL HIGH (ref 8–23)
CO2: 21 mmol/L — ABNORMAL LOW (ref 22–32)
Calcium: 8.7 mg/dL — ABNORMAL LOW (ref 8.9–10.3)
Chloride: 106 mmol/L (ref 98–111)
Creatinine, Ser: 1.49 mg/dL — ABNORMAL HIGH (ref 0.61–1.24)
GFR, Estimated: 45 mL/min — ABNORMAL LOW (ref >60)
Glucose, Bld: 162 mg/dL — ABNORMAL HIGH (ref 70–99)
Potassium: 4.1 mmol/L (ref 3.5–5.1)
Sodium: 136 mmol/L (ref 135–145)

## 2023-04-18 LAB — URINALYSIS, ROUTINE W REFLEX MICROSCOPIC
Bilirubin Urine: NEGATIVE
Glucose, UA: 50 mg/dL — AB
Ketones, ur: NEGATIVE mg/dL
Nitrite: NEGATIVE
Protein, ur: 100 mg/dL — AB
Specific Gravity, Urine: 1.01 (ref 1.005–1.030)
WBC, UA: >50 WBC/hpf (ref 0–5)
pH: 6 (ref 5.0–8.0)

## 2023-04-18 LAB — RAPID URINE DRUG SCREEN, HOSP PERFORMED
Amphetamines: NOT DETECTED
Barbiturates: NOT DETECTED
Benzodiazepines: NOT DETECTED
Cocaine: NOT DETECTED
Opiates: NOT DETECTED
Tetrahydrocannabinol: NOT DETECTED

## 2023-04-18 LAB — CBG MONITORING, ED: Glucose-Capillary: 152 mg/dL — ABNORMAL HIGH (ref 70–99)

## 2023-04-18 LAB — HEMOGLOBIN A1C
Hgb A1c MFr Bld: 8.8 % — ABNORMAL HIGH (ref 4.8–5.6)
Mean Plasma Glucose: 205.86 mg/dL

## 2023-04-18 LAB — ETHANOL: Alcohol, Ethyl (B): <10 mg/dL (ref <10)

## 2023-04-18 LAB — MAGNESIUM: Magnesium: 1.7 mg/dL (ref 1.7–2.4)

## 2023-04-18 MED ORDER — ATORVASTATIN CALCIUM 40 MG PO TABS
40.0000 mg | ORAL_TABLET | Freq: Every day | ORAL | Status: DC
  Administered 2023-04-18 – 2023-04-22 (×5): 40 mg via ORAL
  Filled 2023-04-18 (×5): qty 1

## 2023-04-18 MED ORDER — DOCUSATE SODIUM 100 MG PO CAPS
100.0000 mg | ORAL_CAPSULE | Freq: Two times a day (BID) | ORAL | Status: DC
  Administered 2023-04-18 – 2023-04-23 (×6): 100 mg via ORAL
  Filled 2023-04-18 (×8): qty 1

## 2023-04-18 MED ORDER — PANCRELIPASE (LIP-PROT-AMYL) 24000-76000 UNITS PO CPEP: 1.0000 | ORAL_CAPSULE | Freq: Three times a day (TID) | ORAL | Status: DC

## 2023-04-18 MED ORDER — PANTOPRAZOLE SODIUM 40 MG PO TBEC
40.0000 mg | DELAYED_RELEASE_TABLET | Freq: Every day | ORAL | Status: DC
  Administered 2023-04-21 – 2023-04-23 (×3): 40 mg via ORAL
  Filled 2023-04-18 (×5): qty 1

## 2023-04-18 MED ORDER — SERTRALINE HCL 50 MG PO TABS
25.0000 mg | ORAL_TABLET | Freq: Every day | ORAL | Status: DC
  Administered 2023-04-18: 25 mg via ORAL
  Filled 2023-04-18 (×3): qty 1

## 2023-04-18 MED ORDER — GABAPENTIN 100 MG PO CAPS
100.0000 mg | ORAL_CAPSULE | Freq: Two times a day (BID) | ORAL | Status: DC
  Administered 2023-04-18 – 2023-04-23 (×9): 100 mg via ORAL
  Filled 2023-04-18 (×11): qty 1

## 2023-04-18 MED ORDER — SODIUM CHLORIDE 0.9 % IV SOLN
1.0000 g | Freq: Every day | INTRAVENOUS | Status: DC
  Administered 2023-04-19 – 2023-04-22 (×4): 1 g via INTRAVENOUS
  Filled 2023-04-18 (×4): qty 10

## 2023-04-18 MED ORDER — HALOPERIDOL LACTATE 5 MG/ML IJ SOLN
2.0000 mg | Freq: Four times a day (QID) | INTRAMUSCULAR | Status: AC | PRN
  Administered 2023-04-18 (×2): 2 mg via INTRAVENOUS
  Filled 2023-04-18 (×2): qty 1

## 2023-04-18 MED ORDER — HYDROXYZINE HCL 10 MG PO TABS
10.0000 mg | ORAL_TABLET | Freq: Two times a day (BID) | ORAL | Status: DC | PRN
  Administered 2023-04-21 – 2023-04-22 (×2): 10 mg via ORAL
  Filled 2023-04-18 (×3): qty 1

## 2023-04-18 MED ORDER — PANTOPRAZOLE SODIUM 40 MG IV SOLR
40.0000 mg | INTRAVENOUS | Status: DC
  Administered 2023-04-18: 40 mg via INTRAVENOUS
  Filled 2023-04-18: qty 10

## 2023-04-18 MED ORDER — METOPROLOL TARTRATE 12.5 MG HALF TABLET
12.5000 mg | ORAL_TABLET | Freq: Two times a day (BID) | ORAL | Status: DC
  Administered 2023-04-18 – 2023-04-23 (×9): 12.5 mg via ORAL
  Filled 2023-04-18 (×11): qty 1

## 2023-04-18 MED ORDER — PANCRELIPASE (LIP-PROT-AMYL) 12000-38000 UNITS PO CPEP
24000.0000 [IU] | ORAL_CAPSULE | Freq: Three times a day (TID) | ORAL | Status: DC
  Administered 2023-04-19 – 2023-04-23 (×10): 24000 [IU] via ORAL
  Filled 2023-04-18 (×14): qty 2

## 2023-04-18 MED ORDER — ISOSORBIDE MONONITRATE 20 MG PO TABS: 10.0000 mg | ORAL_TABLET | Freq: Every day | ORAL | Status: DC

## 2023-04-18 MED ORDER — LORATADINE 10 MG PO TABS
10.0000 mg | ORAL_TABLET | Freq: Every day | ORAL | Status: DC
  Administered 2023-04-18 – 2023-04-23 (×4): 10 mg via ORAL
  Filled 2023-04-18 (×6): qty 1

## 2023-04-18 MED ORDER — SODIUM CHLORIDE 0.9% FLUSH
3.0000 mL | Freq: Two times a day (BID) | INTRAVENOUS | Status: DC
  Administered 2023-04-18 – 2023-04-23 (×5): 3 mL via INTRAVENOUS

## 2023-04-18 MED ORDER — SODIUM CHLORIDE 0.9% FLUSH: 3.0000 mL | INTRAVENOUS | Status: DC | PRN

## 2023-04-18 MED ORDER — SODIUM CHLORIDE 0.9 % IV SOLN
1.0000 g | Freq: Once | INTRAVENOUS | Status: AC
  Administered 2023-04-18: 1 g via INTRAVENOUS
  Filled 2023-04-18: qty 10

## 2023-04-18 MED ORDER — ASPIRIN 81 MG PO CHEW
81.0000 mg | CHEWABLE_TABLET | Freq: Every day | ORAL | Status: DC
  Administered 2023-04-18 – 2023-04-23 (×4): 81 mg via ORAL
  Filled 2023-04-18 (×6): qty 1

## 2023-04-18 MED ORDER — SODIUM CHLORIDE 0.9 % IV SOLN: 250.0000 mL | INTRAVENOUS | Status: AC | PRN

## 2023-04-18 MED ORDER — FLUTICASONE FUROATE-VILANTEROL 100-25 MCG/ACT IN AEPB
1.0000 | INHALATION_SPRAY | Freq: Every day | RESPIRATORY_TRACT | Status: DC
  Administered 2023-04-21: 1 via RESPIRATORY_TRACT
  Filled 2023-04-18 (×2): qty 28

## 2023-04-18 MED ORDER — ALBUTEROL SULFATE (2.5 MG/3ML) 0.083% IN NEBU: 2.5000 mg | INHALATION_SOLUTION | RESPIRATORY_TRACT | Status: DC | PRN

## 2023-04-18 MED ORDER — DIVALPROEX SODIUM 125 MG PO CSDR
125.0000 mg | DELAYED_RELEASE_CAPSULE | Freq: Two times a day (BID) | ORAL | Status: DC
  Administered 2023-04-18 – 2023-04-23 (×9): 125 mg via ORAL
  Filled 2023-04-18 (×12): qty 1

## 2023-04-18 MED ORDER — INSULIN GLARGINE-YFGN 100 UNIT/ML ~~LOC~~ SOLN
24.0000 [IU] | Freq: Every day | SUBCUTANEOUS | Status: DC
  Administered 2023-04-18: 24 [IU] via SUBCUTANEOUS
  Filled 2023-04-18 (×2): qty 0.24

## 2023-04-18 MED ORDER — HYDRALAZINE HCL 20 MG/ML IJ SOLN: 5.0000 mg | Freq: Four times a day (QID) | INTRAMUSCULAR | Status: DC | PRN

## 2023-04-18 MED ORDER — FERROUS GLUCONATE 324 (38 FE) MG PO TABS
324.0000 mg | ORAL_TABLET | Freq: Every day | ORAL | Status: DC
  Administered 2023-04-21 – 2023-04-23 (×3): 324 mg via ORAL
  Filled 2023-04-18 (×6): qty 1

## 2023-04-18 NOTE — Hospital Course (Signed)
 89 year old man history of DM type II, essential hypertension, hypercholesteremia, iron  deficiency anemia, chronic pancreatitis,, colonic adenocarcinoma in 2015, CKD stage IIIb, history of CVA with residual mild right-sided hemiparesis, TIA, mild vascular Anzemet dementia presented to emergency department for evaluation for altered mental status.  Family reported he was to his self earlier today and patient's son last saw him around 6 PM 9 1/9.  Family currently concern for worsening right-sided facial droop and slurred speech.  In the ED neurology has been evaluated patient recommended to admit for -need workup for MRI, continue aspirin , hold Plavix  pending full evaluation given history of GI bleed, check A1c panel, lipid panel, echocardiogram, MRI of the head, Ultrasound, Permissive Hypertension, PT, OT and SLP Evaluation..  In the ED patient found borderline hypotensive.  UA showed evidence of UTI.  CBC stable H&H.  CMP showed elevated blood glucose, stable renal function. CT head no acute intracranial abnormality.  Pending MRI of the brain. ED physician reported that patient has fluctuating mentation and restless unable to obtain the MRI.  ED physician informed neurology.  Given already came with altered mentation not giving any Ativan  to make him more sedated for the MRI.  Neurology out of that.  Patient also has left foot (am not sure which 2) blackish discoloration so I of the foot with contrast has been ordered to rule out osteomyelitis.  Plan Dr. Wanita patient is acute metabolic encephalopathy likely secondary to UTI.  Hospitalist has been contacted for further management of acute metabolic encephalopathy likely secondary from UTI versus stroke.

## 2023-04-18 NOTE — Evaluation (Signed)
 Clinical/Bedside Swallow Evaluation Patient Details  Name: Justin [name] MRN: [id number] Date of Birth: [birth year]  Today's Date: 04/18/2023 Time: SLP Start Time (ACUTE ONLY): 1018 SLP Stop Time (ACUTE ONLY): 1038 SLP Time Calculation (min) (ACUTE ONLY): 20 min  Past Medical History:  Past Medical History:  Diagnosis Date   Acute metabolic encephalopathy 05/24/2021   Arthritis    Diabetes mellitus    HTN (hypertension)    Hypercholesteremia    Iron  deficiency    Pancreatitis, acute 06/02/2012   Diagnosed 05/2012    Personal history of colonic adenomas 04/15/2013   04/15/2013 2 diminutive polyps     Stroke [facility])    TIA (transient ischemic attack)    Past Surgical History:  Past Surgical History:  Procedure Laterality Date   ABDOMINAL AORTOGRAM W/LOWER EXTREMITY Right 11/24/2021   Procedure: ABDOMINAL AORTOGRAM W/LOWER EXTREMITY;  Surgeon: Magda Debby SAILOR, MD;  Location: [facility] INVASIVE CV [facility];  Service: Cardiovascular;  Laterality: Right;   AMPUTATION TOE Right 11/26/2021   Procedure: AMPUTATION TOE;  Surgeon: Gershon Donnice SAUNDERS, DPM;  Location: [facility] OR;  Service: Podiatry;  Laterality: Right;   ARTERY BIOPSY  02/26/2011   Procedure: BIOPSY TEMPORAL ARTERY;  Surgeon: Elsie JINNY Signs, MD;  Location: [facility];  Service: General;  Laterality: Left;  Left temporal artery biospy   BACK SURGERY  1985   CHOLECYSTECTOMY     COLONOSCOPY     ESOPHAGOGASTRODUODENOSCOPY N/A 12/06/2022   Procedure: ESOPHAGOGASTRODUODENOSCOPY (EGD);  Surgeon: San Sandor GAILS, DO;  Location[facility] ENDOSCOPY;  Service: Gastroenterology;  Laterality: N/A;   EUS     HEMOSTASIS CLIP PLACEMENT  12/06/2022   Procedure: HEMOSTASIS CLIP PLACEMENT;  Surgeon: San Sandor GAILS, DO;  Location: [facility] ENDOSCOPY;  Service: Gastroenterology;;   HOT HEMOSTASIS N/A 12/06/2022   Procedure: HOT HEMOSTASIS (ARGON PLASMA COAGULATION/BICAP);  Surgeon: San Sandor GAILS, DO;  Location[facility] ENDOSCOPY;  Service:  Gastroenterology;  Laterality: N/A;   NECK SURGERY  1985   PERIPHERAL VASCULAR BALLOON ANGIOPLASTY Right 11/24/2021   Procedure: PERIPHERAL VASCULAR BALLOON ANGIOPLASTY;  Surgeon: Magda Debby SAILOR, MD;  Location: [facility] INVASIVE CV LAB;  Service: Cardiovascular;  Laterality: Right;  POPLITEAL AND PERONEAL   TEMPORAL ARTERY BIOPSY / LIGATION  02/26/2011   left side   TRANSMETATARSAL AMPUTATION Right 01/28/2022   Procedure: TRANSMETATARSAL AMPUTATION; ACHILLES TENDON LENGTHENING;  Surgeon: Tobie Franky SQUIBB, DPM;  Location: [facility] ORS;  Service: Podiatry;  Laterality: Right;  R foot Transmetatarsal amputation, possible tendo achilles lengthening   UPPER GASTROINTESTINAL ENDOSCOPY     HPI:  89 year old male presented to ED from SNF with dysarthria, somnolence.  PMHx mixed vascular and Alzheimer's dementia, DM, HTN, hyperlipidemia, and CVA with residual mild right hemiparesis. Had MBS during August 2023 hospitalization.  MBS 11/17/21 revealed mild oral holding, lingual pumping.  Pharyngeal swallow WNL. Stasis upper thoracic esophagus. Pt's overall swallowing abilities fluctuated during that admission, relative to MS.    Assessment / Plan / Recommendation  Clinical Impression  Pt presents with functional swallowing. Daughter at bedside and reports that he is back to baseline with regard to mentation/communication. Demonstrates lower facial and tongue symmetry. Upper left face asymmetry. Follows basic commands. Drank 8 oz of thin liquids, ate applesauce and crackers with only one incident of mild coughing after drinking sequential boluses of liquid from a straw.  No longer wears his dentures.  No swallowing issues PTA. Recommend resuming a mechanical soft diet, thin liquids; meds whole in liquid. Slow rate of drinking recommended. No SLP f/u  needed. D/W dtr and RN. SLP Visit Diagnosis: Dysphagia, unspecified (R13.10)    Aspiration Risk  No limitations    Diet Recommendation   Dysphagia 3 (mechanical  soft);Thin  Medication Administration: Whole meds with liquid    Other  Recommendations Oral Care Recommendations: Oral care BID    Recommendations for follow up therapy are one component of a multi-disciplinary discharge planning process, led by the attending physician.  Recommendations may be updated based on patient status, additional functional criteria and insurance authorization.  Follow up Recommendations No SLP follow up        Swallow Study   General Date of Onset: 04/17/23 HPI: 89 year old male presented to ED from SNF with dysarthria, somnolence.  PMHx mixed vascular and Alzheimer's dementia, DM, HTN, hyperlipidemia, and CVA with residual mild right hemiparesis. Had MBS during August 2023 hospitalization.  MBS 11/17/21 revealed mild oral holding, lingual pumping.  Pharyngeal swallow WNL. Stasis upper thoracic esophagus. Pt's overall swallowing abilities fluctuated during that admission, relative to MS. Type of Study: Bedside Swallow Evaluation Previous Swallow Assessment: see HPI Diet Prior to this Study: NPO Temperature Spikes Noted: No Respiratory Status: Room air History of Recent Intubation: No Behavior/Cognition: Alert Oral Cavity Assessment: Within Functional Limits Oral Care Completed by SLP: Yes Oral Cavity - Dentition: Edentulous Self-Feeding Abilities: Needs assist Patient Positioning: Upright in bed Baseline Vocal Quality: Normal Volitional Cough: Strong Volitional Swallow: Able to elicit    Oral/Motor/Sensory Function Overall Oral Motor/Sensory Function: Mild impairment Facial ROM: Other (Comment) (flattening left forehead)   Ice Chips Ice chips: Within functional limits   Thin Liquid Thin Liquid: Within functional limits    Nectar Thick Nectar Thick Liquid: Not tested   Honey Thick Honey Thick Liquid: Not tested   Puree Puree: Within functional limits   Solid     Solid: Within functional limits      Vona Palma Laurice 04/18/2023,10:43  AM  Palma L. Vona, MA CCC/SLP Clinical Specialist - Acute Care SLP Acute Rehabilitation Services Office number [phone]

## 2023-04-18 NOTE — Consult Note (Signed)
 Consultation Note Date: 04/18/2023   Patient Name: Justin [name]  DOB: [birth year]  MRN: [id number]  Age / Sex: 89 y.o., male  PCP: Menzer, Junette HERO, MD Referring Physician: Sherlon Brayton RAMAN, MD  Reason for Consultation: Establishing goals of care  HPI/Patient Profile: 89 y.o. male  with past medical history of DM type II, essential hypertension, hypercholesteremia, iron  deficiency anemia,  chronic pancreatitis, colonic adenocarcinoma in 2015, CKD stage IIIb, history of CVA with residual mild right-sided hemiparesis, TIA, GI Bleed, mixed vascular and alzheimer's dementia, and amputation of (R) toes admitted on 04/17/2023 with AMS. Found to have UTI - MRI and CT negative for acute process. Found to have R great toe dry gangrene. PMT consulted to discuss GOC.   Clinical Assessment and Goals of Care: I have reviewed medical records including EPIC notes, labs and imaging, assessed the patient and then met with patient, daughter [name] and son Darin  to discuss diagnosis prognosis, GOC, EOL wishes, disposition and options.  I introduced Palliative Medicine as specialized medical care for people living with serious illness. It focuses on providing relief from the symptoms and stress of a serious illness. The goal is to improve quality of life for both the patient and the family.  We discussed a brief life review of the patient. They share about patient's personality - loves to make jokes, fun to be around. 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   Patient has resided at [facility] for 4 years.  As far as functional and nutritional status he has been essentially bedbound/wheelchair bound since he's been at [facility]. They share he eats well when they bring him food he prefers to SNF - however they do admit he mostly prefers sweets.   Family shares they requested palliative consult because the social worker at  patient's facility recommended they do so to discuss plan moving forward.    We discussed patient's current illness and what it means in the larger context of patient's on-going co-morbidities.  We discuss currently he is being treated for UTI and this is likely cause of his AMS. We discuss plan to admit and treat UTI. We discuss we expect improvement from current situation. We discuss that long term we expect continued decline, likely repeated hospital admission if patient continues on aggressive medical path.   I attempted to elicit values and goals of care important to the patient.   We review patient's MOST from that was completed in June of 2024.   Completed as follows:  Cardiopulmonary Resuscitation: Do Not Attempt Resuscitation (DNR/No CPR)  Medical Interventions: Limited Additional Interventions: Use medical treatment, IV fluids and cardiac monitoring as indicated, DO NOT USE intubation or mechanical ventilation. May consider use of less invasive airway support such as BiPAP or CPAP. Also provide comfort measures. Transfer to the hospital if indicated. Avoid intensive care.   Antibiotics: Antibiotics if indicated  IV Fluids: IV fluids if indicated  Feeding Tube: No feeding tube   This form was uploaded into [employer].  Reviewed form with family. They state this is accurate and reflects current wishes for treatment.   We discuss that with patient's expected decline there may come a time when continued admissions to the hospital no longer make sense, no longer contribute to quality of life, or become more of a burden - we discuss when that time comes they may consider support of hospice. We discuss philosophy of hospice care.   We  discuss outpatient palliative to follow at SNF and help support family and continue discussions about when to involve hospice - they agree.   Discussed with family the importance of continued conversation with family and the medical providers regarding overall plan  of care and treatment options, ensuring decisions are within the context of the patient's values and GOCs.    Questions and concerns were addressed. The family was encouraged to call with questions or concerns.    Primary Decision Maker NEXT OF KIN     SUMMARY OF RECOMMENDATIONS   - MOST as above (completed June 2024, remains accurate) - patient already referred to outpatient palliative support - they have been in touch with daughter, continue conversations outpatient - plan to treat UTI and dc back to SNF in coming days  Code Status/Advance Care Planning: DNR     Primary Diagnoses: Present on Admission:  Altered mental status  Acute metabolic encephalopathy  Chronic kidney disease, stage 3b ([name])  Mixed diabetic hyperlipidemia associated with type 2 diabetes mellitus ([name])  Essential hypertension  Chronic pancreatitis ([name])   I have reviewed the medical record, interviewed the patient and family, and examined the patient. The following aspects are pertinent.  Past Medical History:  Diagnosis Date   Acute metabolic encephalopathy 05/24/2021   Arthritis    Diabetes mellitus    HTN (hypertension)    Hypercholesteremia    Iron  deficiency    Pancreatitis, acute 06/02/2012   Diagnosed 05/2012    Personal history of colonic adenomas 04/15/2013   04/15/2013 2 diminutive polyps     Stroke ([name])    TIA (transient ischemic attack)    Social History   Socioeconomic History   Marital status: Married    Spouse name: Not on file   Number of children: 6   Years of education: Not on file   Highest education level: Not on file  Occupational History   Not on file  Tobacco Use   Smoking status: Former    Current packs/day: 0.00    Types: Cigarettes    Start date: 04/10/1955    Quit date: 04/09/1990    Years since quitting: 33.0   Smokeless tobacco: Never  Substance and Sexual Activity   Alcohol  use: No    Comment: per pt stopped drinking in the 90's   Drug use: No   Sexual  activity: Not on file  Other Topics Concern   Not on file  Social History Narrative   Not on file   Social Drivers of Health   Financial Resource Strain: Not on file  Food Insecurity: No Food Insecurity (12/05/2022)   Hunger Vital Sign    Worried About Running Out of Food in the Last Year: Never true    Ran Out of Food in the Last Year: Never true  Transportation Needs: No Transportation Needs (12/05/2022)   PRAPARE - Administrator, Civil Service (Medical): No    Lack of Transportation (Non-Medical): No  Physical Activity: Not on file  Stress: Not on file  Social Connections: Not on file   Family History  Problem Relation Age of Onset   Heart disease Mother    Amblyopia Father    Diabetes Sister        x 2   Heart attack Sister    Scheduled Meds:  aspirin   81 mg Oral Daily   atorvastatin   40 mg Oral QHS   divalproex   125 mg Oral BID   docusate sodium   100 mg Oral  BID   ferrous gluconate   324 mg Oral Daily   fluticasone  furoate-vilanterol  1 puff Inhalation Daily   gabapentin   100 mg Oral BID   insulin  glargine-yfgn  24 Units Subcutaneous QHS   loratadine   10 mg Oral Daily   metoprolol  tartrate  12.5 mg Oral BID   Pancrelipase  (Lip-Prot-Amyl)  1 capsule Oral TID   [START ON 04/19/2023] pantoprazole   40 mg Oral Daily   sertraline   25 mg Oral Daily   sodium chloride  flush  3 mL Intravenous Q12H   Continuous Infusions:  sodium chloride      [START ON 04/19/2023] cefTRIAXone  (ROCEPHIN )  IV     PRN Meds:.sodium chloride , albuterol , haloperidol  lactate, hydrALAZINE , hydrOXYzine , sodium chloride  flush No Known Allergies Review of Systems  Unable to perform ROS: Dementia    Physical Exam Constitutional:      General: He is not in acute distress.    Appearance: He is ill-appearing.  Cardiovascular:     Rate and Rhythm: Normal rate. Rhythm irregular.  Pulmonary:     Effort: Pulmonary effort is normal.  Neurological:     Mental Status: He is alert. He is  disoriented.     Vital Signs: BP (!) 170/88   Pulse 71   Temp 97.7 F (36.5 C) (Oral)   Resp 18   Ht 6' 2 (1.88 m)   Wt 67 kg   SpO2 96%   BMI 18.96 kg/m          SpO2: SpO2: 96 % O2 Device:SpO2: 96 % O2 Flow Rate: .   IO: Intake/output summary: No intake or output data in the 24 hours ending 04/18/23 1657  LBM:   Baseline Weight: Weight: 67 kg Most recent weight: Weight: 67 kg     Palliative Assessment/Data: PPS 40%     *Please note that this is a verbal dictation therefore any spelling or grammatical errors are due to the Dragon Medical One system interpretation.   Time Total: 80 minutes Time spent includes: Detailed review of medical records (labs, imaging, vital signs), medically appropriate exam, discussion with treatment team, counseling and educating patient, family and/or staff, documenting clinical information, medication management and coordination of care.    Tobey Jama Barnacle, DNP, AGNP-C Palliative Medicine Team [phone] Pager: [phone]

## 2023-04-18 NOTE — Care Management Obs Status (Addendum)
 MEDICARE OBSERVATION STATUS NOTIFICATION   Patient Details  Name: Justin [name] MRN: [id number] Date of Birth: [birth year]   Medicare Observation Status Notification Given:   Yes   [name] RNCM reviewed form with [name] (daughter) [phone] she is agreeable and has provided verbal consent ) Copy will sent via certified mail   GOSLING CALKIN, RN 04/18/2023, 7:12 PM

## 2023-04-18 NOTE — ED Notes (Signed)
Phlebotomy at bedside for labs.  

## 2023-04-18 NOTE — Plan of Care (Addendum)
 Attempted to see patient but he was in MRI  Vascular consult  recommended.   Possible left great toe amp Saturday if no vascular intervention needed, though based on prior notes, he will likely require LLE angiogram prior to my intervention.  Full consult to follow  tomorrow.

## 2023-04-18 NOTE — ED Notes (Signed)
 This RN came to bedside due to patient having v-tach showing on monitor then went into ventricular bigeminy.  ED MD Emil came to bedside.  Patient not in acute distress.  As this RN tried to get an EKG patient converted to NSR.  MD Elgergawy notified and aware.

## 2023-04-18 NOTE — ED Notes (Signed)
 Report given to receiving RN.

## 2023-04-18 NOTE — ED Notes (Signed)
 ED TO INPATIENT HANDOFF REPORT  ED Nurse Name and Phone #: Justin RAMAN Name/Age/Gender Justin [name] 89 y.o. male Room/Bed: 041C/041C  Code Status   Code Status: Limited: Do not attempt resuscitation (DNR) -DNR-LIMITED -Do Not Intubate/DNI   Home/SNF/Other Skilled nursing facility Patient oriented to: self Is this baseline? Yes   Triage Complete: Triage complete  Chief Complaint Altered mental status [R41.82]  Triage Note BIB EMS from facility called due to patient change in mental status. Pt was asleep when EMS arrived and pt states he just wants to go to sleep. Pt mental status is baseline A&O x 3    CBG 213    Allergies No Known Allergies  Level of Care/Admitting Diagnosis ED Disposition     ED Disposition  Admit   Condition  --   Comment  Hospital Area: [facility] [[id number]]  Level of Care: Telemetry Medical [104]  May place patient in observation at [facility] or [facility] if equivalent level of care is available:: Yes  Covid Evaluation: Asymptomatic - no recent exposure (last 10 days) testing not required  Diagnosis: Altered mental status [780.97.ICD-9-CM]  Admitting Physician: Justin [name]  Attending Physician: Justin [name]          B Medical/Surgery History Past Medical History:  Diagnosis Date   Acute metabolic encephalopathy 05/24/2021   Arthritis    Diabetes mellitus    HTN (hypertension)    Hypercholesteremia    Iron  deficiency    Pancreatitis, acute 06/02/2012   Diagnosed 05/2012    Personal history of colonic adenomas 04/15/2013   04/15/2013 2 diminutive polyps     Stroke [facility])    TIA (transient ischemic attack)    Past Surgical History:  Procedure Laterality Date   ABDOMINAL AORTOGRAM W/LOWER EXTREMITY Right 11/24/2021   Procedure: ABDOMINAL AORTOGRAM W/LOWER EXTREMITY;  Surgeon: Magda Debby SAILOR, MD;  Location: [facility] INVASIVE CV LAB;  Service: Cardiovascular;  Laterality: Right;    AMPUTATION TOE Right 11/26/2021   Procedure: AMPUTATION TOE;  Surgeon: Gershon Donnice SAUNDERS, DPM;  Location: [facility] OR;  Service: Podiatry;  Laterality: Right;   ARTERY BIOPSY  02/26/2011   Procedure: BIOPSY TEMPORAL ARTERY;  Surgeon: Elsie JINNY Signs, MD;  Location: [facility];  Service: General;  Laterality: Left;  Left temporal artery biospy   BACK SURGERY  1985   CHOLECYSTECTOMY     COLONOSCOPY     ESOPHAGOGASTRODUODENOSCOPY N/A 12/06/2022   Procedure: ESOPHAGOGASTRODUODENOSCOPY (EGD);  Surgeon: San Sandor GAILS, DO;  Location: [facility] ENDOSCOPY;  Service: Gastroenterology;  Laterality: N/A;   EUS     HEMOSTASIS CLIP PLACEMENT  12/06/2022   Procedure: HEMOSTASIS CLIP PLACEMENT;  Surgeon: San Sandor GAILS, DO;  Location: [facility] ENDOSCOPY;  Service: Gastroenterology;;   HOT HEMOSTASIS N/A 12/06/2022   Procedure: HOT HEMOSTASIS (ARGON PLASMA COAGULATION/BICAP);  Surgeon: San Sandor GAILS, DO;  Location: [facility] END[facility]COPY;  Service: Gastroenterology;  Laterality: N/A;   NECK SURGERY  1985   PERIPHERAL VASCULAR BALLOON ANGIOPLASTY Right 11/24/2021   Procedure: PERIPHERAL VASCULAR BALLOON ANGIOPLASTY;  Surgeon: Magda Debby SAILOR, MD;  Location: [facility];  Service: Cardiovascular;  Laterality: Right;  POPLITEAL AND PERONEAL   TEMPORAL ARTERY BIOPSY / LIGATION  02/26/2011   left side   TRANSMETATARSAL AMPUTATION Right 01/28/2022   Procedure: TRANSMETATARSAL AMPUTATION; ACHILLES TENDON LENGTHENING;  Surgeon: Tobie Franky SQUIBB, DPM;  Location: [facility] ORS;  Service: Podiatry;  Laterality: Right;  R foot Transmetatarsal amputation, possible tendo achilles lengthening  UPPER GASTROINTESTINAL ENDOSCOPY       A IV Location/Drains/Wounds Patient Lines/Drains/Airways Status     Active Line/Drains/Airways     Name Placement date Placement time Site Days   Peripheral IV 04/17/23 22 G Anterior;Right Forearm 04/17/23  2327  Forearm  1   Pressure Injury 11/24/21 Heel Right Stage 2 -  Partial  thickness loss of dermis presenting as a shallow open injury with a red, pink wound bed without slough. 11/24/21  2200  -- 510   Wound / Incision (Open or Dehisced) 11/21/21 Other (Comment) Toe (Comment  which one) Anterior;Right right 2nd toe 11/21/21  0645  Toe (Comment  which one)  513   Wound / Incision (Open or Dehisced) 11/21/21 Other (Comment) Heel Left;Posterior 11/21/21  0645  Heel  513            Intake/Output Last 24 hours No intake or output data in the 24 hours ending 04/18/23 1725  Labs/Imaging Results for orders placed or performed during the hospital encounter of 04/17/23 (from the past 48 hours)  I-stat chem 8, ED     Status: Abnormal   Collection Time: 04/17/23 11:28 PM  Result Value Ref Range   Sodium 141 135 - 145 mmol/L   Potassium 4.1 3.5 - 5.1 mmol/L   Chloride 107 98 - 111 mmol/L   BUN 30 (H) 8 - 23 mg/dL   Creatinine, Ser 8.29 (H) 0.61 - 1.24 mg/dL   Glucose, Bld 799 (H) 70 - 99 mg/dL    Comment: Glucose reference range applies only to samples taken after fasting for at least 8 hours.   Calcium , Ion 1.16 1.15 - 1.40 mmol/L   TCO2 23 22 - 32 mmol/L   Hemoglobin 11.9 (L) 13.0 - 17.0 g/dL   HCT 64.9 (L) 60.9 - 47.9 %  Comprehensive metabolic panel     Status: Abnormal   Collection Time: 04/17/23 11:30 PM  Result Value Ref Range   Sodium 136 135 - 145 mmol/L   Potassium 4.0 3.5 - 5.1 mmol/L   Chloride 106 98 - 111 mmol/L   CO2 22 22 - 32 mmol/L   Glucose, Bld 203 (H) 70 - 99 mg/dL    Comment: Glucose reference range applies only to samples taken after fasting for at least 8 hours.   BUN 27 (H) 8 - 23 mg/dL   Creatinine, Ser 8.35 (H) 0.61 - 1.24 mg/dL   Calcium  8.9 8.9 - 10.3 mg/dL   Total Protein 8.8 (H) 6.5 - 8.1 g/dL   Albumin 3.1 (L) 3.5 - 5.0 g/dL   AST 22 15 - 41 U/L   ALT 14 0 - 44 U/L   Alkaline Phosphatase 75 38 - 126 U/L   Total Bilirubin 0.4 0.0 - 1.2 mg/dL   GFR, Estimated 40 (L) >60 mL/min    Comment: (NOTE) Calculated using the  CKD-EPI Creatinine Equation (2021)    Anion gap 8 5 - 15    Comment: Performed at [facility], [address] [place], [place] [address]  CBC     Status: Abnormal   Collection Time: 04/17/23 11:30 PM  Result Value Ref Range   WBC 9.5 4.0 - 10.5 K/uL   RBC 3.63 (L) 4.22 - 5.81 MIL/uL   Hemoglobin 11.0 (L) 13.0 - 17.0 g/dL   HCT 66.0 (L) 60.9 - 47.9 %   MCV 93.4 80.0 - 100.0 fL   MCH 30.3 26.0 - 34.0 pg   MCHC 32.4 30.0 - 36.0  g/dL   RDW 83.2 (H) 88.4 - 84.4 %   Platelets 244 150 - 400 K/uL   nRBC 0.0 0.0 - 0.2 %    Comment: Performed at [facility], [address], [place], [place] [address]  Protime-INR     Status: None   Collection Time: 04/17/23 11:30 PM  Result Value Ref Range   Prothrombin Time 14.4 11.4 - 15.2 seconds   INR 1.1 0.8 - 1.2    Comment: (NOTE) INR goal varies based on device and disease states. Performed at [facility], [address] [place], [place] [address]   Ethanol     Status: None   Collection Time: 04/17/23 11:30 PM  Result Value Ref Range   Alcohol , Ethyl (B) <10 <10 mg/dL    Comment: (NOTE) Lowest detectable limit for serum alcohol  is 10 mg/dL.  For medical purposes only. Performed at [facility], [address], [place], [place] [address]   APTT     Status: None   Collection Time: 04/17/23 11:30 PM  Result Value Ref Range   aPTT 29 24 - 36 seconds    Comment: Performed at [facility], [address] [place], [place] [address]  Differential     Status: Abnormal   Collection Time: 04/17/23 11:30 PM  Result Value Ref Range   Neutrophils Relative % 45 %   Neutro Abs 4.2 1.7 - 7.7 K/uL   Lymphocytes Relative 37 %   Lymphs Abs 3.5 0.7 - 4.0 K/uL   Monocytes Relative 10 %   Monocytes Absolute 0.9 0.1 - 1.0 K/uL   Eosinophils Relative 6 %   Eosinophils Absolute 0.6 (H) 0.0 - 0.5 K/uL   Basophils Relative 1 %   Basophils Absolute 0.1 0.0 - 0.1 K/uL   Immature Granulocytes 1 %   Abs Immature  Granulocytes 0.09 (H) 0.00 - 0.07 K/uL    Comment: Performed at [facility], [address], [place], [place] [address]  Urinalysis, Routine w reflex microscopic -Urine, Catheterized     Status: Abnormal   Collection Time: 04/18/23  2:33 AM  Result Value Ref Range   Color, Urine YELLOW YELLOW   APPearance HAZY (A) CLEAR   Specific Gravity, Urine 1.010 1.005 - 1.030   pH 6.0 5.0 - 8.0   Glucose, UA 50 (A) NEGATIVE mg/dL   Hgb urine dipstick SMALL (A) NEGATIVE   Bilirubin Urine NEGATIVE NEGATIVE   Ketones, ur NEGATIVE NEGATIVE mg/dL   Protein, ur 899 (A) NEGATIVE mg/dL   Nitrite NEGATIVE NEGATIVE   Leukocytes,Ua LARGE (A) NEGATIVE   RBC / HPF 6-10 0 - 5 RBC/hpf   WBC, UA >50 0 - 5 WBC/hpf   Bacteria, UA FEW (A) NONE SEEN   Squamous Epithelial / HPF 0-5 0 - 5 /HPF   Mucus PRESENT     Comment: Performed at [facility], [address] [place], [place] [address]  Urine rapid drug screen (hosp performed)     Status: None   Collection Time: 04/18/23  2:33 AM  Result Value Ref Range   Opiates NONE DETECTED NONE DETECTED   Cocaine NONE DETECTED NONE DETECTED   Benzodiazepines NONE DETECTED NONE DETECTED   Amphetamines NONE DETECTED NONE DETECTED   Tetrahydrocannabinol NONE DETECTED NONE DETECTED   Barbiturates NONE DETECTED NONE DETECTED    Comment: (NOTE) DRUG SCREEN FOR MEDICAL PURPOSES ONLY.  IF CONFIRMATION IS NEEDED FOR ANY PURPOSE, NOTIFY LAB WITHIN 5 DAYS.  LOWEST DETECTABLE LIMITS FOR URINE DRUG SCREEN Drug Class                     Cutoff (ng/mL) Amphetamine and metabolites    1000 Barbiturate and metabolites    200 Benzodiazepine                 200 Opiates and metabolites        300 Cocaine and metabolites        300 THC                            50 Performed at [facility], [address] [place], [place] [address]   CBG monitoring, ED     Status: Abnormal   Collection Time: 04/18/23  7:33 AM  Result Value Ref Range   Glucose-Capillary 152  (H) 70 - 99 mg/dL    Comment: Glucose reference range applies only to samples taken after fasting for at least 8 hours.  Basic metabolic panel     Status: Abnormal   Collection Time: 04/18/23  7:38 AM  Result Value Ref Range   Sodium 136 135 - 145 mmol/L   Potassium 4.1 3.5 - 5.1 mmol/L   Chloride 106 98 - 111 mmol/L   CO2 21 (L) 22 - 32 mmol/L   Glucose, Bld 162 (H) 70 - 99 mg/dL    Comment: Glucose reference range applies only to samples taken after fasting for at least 8 hours.   BUN 25 (H) 8 - 23 mg/dL   Creatinine, Ser 8.50 (H) 0.61 - 1.24 mg/dL   Calcium  8.7 (L) 8.9 - 10.3 mg/dL   GFR, Estimated 45 (L) >60 mL/min    Comment: (NOTE) Calculated using the CKD-EPI Creatinine Equation (2021)    Anion gap 9 5 - 15    Comment: Performed at [facility], [address] [place], [place] [address]  CBC     Status: Abnormal   Collection Time: 04/18/23  7:38 AM  Result Value Ref Range   WBC 9.6 4.0 - 10.5 K/uL   RBC 3.49 (L) 4.22 - 5.81 MIL/uL   Hemoglobin 10.6 (L) 13.0 - 17.0 g/dL   HCT 66.9 (L) 60.9 - 47.9 %   MCV 94.6 80.0 - 100.0 fL   MCH 30.4 26.0 - 34.0 pg   MCHC 32.1 30.0 - 36.0 g/dL   RDW 83.5 (H) 88.4 - 84.4 %   Platelets 243 150 - 400 K/uL   nRBC 0.0 0.0 - 0.2 %    Comment: Performed at [facility], [address], [place], [place] [address]  Lipid panel     Status: None   Collection Time: 04/18/23  7:38 AM  Result Value Ref Range   Cholesterol 118 0 - 200 mg/dL   Triglycerides 49 <849 mg/dL   HDL 55 >59 mg/dL   Total CHOL/HDL Ratio 2.1 RATIO   VLDL 10 0 - 40 mg/dL   LDL Cholesterol 53 0 - 99 mg/dL    Comment:        Total Cholesterol/HDL:CHD Risk Coronary Heart Disease Risk Table                     Men   Women  1/2 Average Risk   3.4   3.3  Average Risk       5.0   4.4  2 X Average Risk  9.6   7.1  3 X Average Risk  23.4   11.0        Use the calculated Patient Ratio above and the CHD Risk Table to determine the patient's CHD Risk.         ATP III CLASSIFICATION (LDL):  <100     mg/dL   Optimal  899-870  mg/dL   Near or Above                    Optimal  130-159  mg/dL   Borderline  839-810  mg/dL   High  >809     mg/dL   Very High Performed at [facility], [address] [place], [place] [address]   Hemoglobin A1c     Status: Abnormal   Collection Time: 04/18/23  7:38 AM  Result Value Ref Range   Hgb A1c MFr Bld 8.8 (H) 4.8 - 5.6 %    Comment: (NOTE) Pre diabetes:          5.7%-6.4%  Diabetes:              >6.4%  Glycemic control for   <7.0% adults with diabetes    Mean Plasma Glucose 205.86 mg/dL    Comment: Performed at [facility], [address], [place], [place] [address]   MR BRAIN WO CONTRAST Result Date: 04/18/2023 CLINICAL DATA:  Neuro deficit, acute, stroke suspected. Left facial droop. Dysarthria. Past medical history of acute metabolic encephalopathy. EXAM: MRI HEAD WITHOUT CONTRAST TECHNIQUE: Multiplanar, multiecho pulse sequences of the brain and surrounding structures were obtained without intravenous contrast. COMPARISON:  CT head without contrast 04/17/2023. MR head without contrast 01/17/2023 FINDINGS: Brain: Advanced atrophy and diffuse white matter disease is again noted. No acute infarct or hemorrhage is present. No mass lesion is present. Dilated perivascular spaces are present within the basal ganglia bilaterally. Remote lacunar infarcts are present in the cerebellum bilaterally. Marked thinning of the corpus callosum is present. The ventricles are proportionate to the degree of atrophy. No significant extraaxial fluid collection is present. The internal auditory canals are within normal limits. Vascular: Flow is present in the major intracranial arteries. Skull and upper cervical spine: The craniocervical junction is normal. Upper cervical spine is within normal limits. Marrow signal is unremarkable. Sinuses/Orbits: A fluid level is present the right maxillary sinus. Mucosal thickening  is present in the ethmoid air cells bilaterally. The paranasal sinuses and mastoid air cells are otherwise clear. A right lens replacement is present. Globes and orbits are within normal limits bilaterally. IMPRESSION: 1. No acute intracranial abnormality or significant interval change. 2. Advanced atrophy and diffuse white matter disease likely reflects the sequela of chronic microvascular ischemia. 3. Remote lacunar infarcts of the cerebellum bilaterally. 4. Fluid level in the right maxillary sinus may reflect acute sinusitis. Electronically Signed   By: Lonni Necessary M.D.   On: 04/18/2023 14:06   CT HEAD CODE STROKE WO CONTRAST Result Date: 04/17/2023 CLINICAL DATA:  Code stroke. Initial evaluation for acute neuro deficit, left facial droop. EXAM: CT HEAD WITHOUT CONTRAST TECHNIQUE: Contiguous axial images were obtained from the base of the skull through the vertex without intravenous contrast. RADIATION DOSE REDUCTION: This exam was performed according to the departmental dose-optimization program which includes automated exposure control, adjustment of the mA and/or kV according to patient size and/or use of iterative reconstruction technique. COMPARISON:  MRI from 01/17/2023. FINDINGS: Brain: Advanced age-related cerebral atrophy with chronic small vessel ischemic disease.  Few scattered remote cerebellar infarcts noted. No acute intracranial hemorrhage. No acute large vessel territory infarct. No mass lesion or midline shift. Mild ventricular prominence related global parenchymal volume loss of hydrocephalus. No extra-axial fluid collection. Vascular: No abnormal hyperdense vessel. Calcified atherosclerosis present about the skull base. Skull: Scalp soft tissues within normal limits.  Calvarium intact. Sinuses/Orbits: Globes orbital soft tissues demonstrate no acute finding. Ocular senescent calcifications noted. Mild to moderate mucosal thickening present about the sphenoethmoidal and maxillary  sinuses. Few superimposed air-fluid levels noted. Mastoid air cells are clear. Other: None. ASPECTS [facility] Stroke Program Early CT Score) - Ganglionic level infarction (caudate, lentiform nuclei, internal capsule, insula, M1-M3 cortex): 7 - Supraganglionic infarction (M4-M6 cortex): 3 Total score (0-10 with 10 being normal): 10 IMPRESSION: 1. No acute intracranial abnormality. 2. ASPECTS is 10. 3. Advanced age-related cerebral atrophy with chronic small vessel ischemic disease. Few scatter remote cerebellar infarcts. 4. Mucosal thickening with a few scattered air-fluid levels within the paranasal sinuses. Clinical correlation for possible acute sinusitis recommended. These results were communicated to Dr. Jerrie at 11:03 pm on 04/17/2023 by text page via the [facility] messaging system. Electronically Signed   By: Morene Hoard M.D.   On: 04/17/2023 23:04    Pending Labs Unresulted Labs (From admission, onward)     Start     Ordered   04/19/23 0500  Basic metabolic panel  Tomorrow morning,   R        04/18/23 0745   04/19/23 0500  CBC  Tomorrow morning,   R        04/18/23 0745   04/19/23 0500  Magnesium   Tomorrow morning,   R        04/18/23 1648   04/18/23 1159  Lipid panel  Add-on,   AD        04/18/23 1158   04/18/23 0913  Procalcitonin  Add-on,   AD       References:    Procalcitonin Lower Respiratory Tract Infection AND Sepsis Procalcitonin Algorithm   04/18/23 0912   04/18/23 0907  Magnesium   Add-on,   AD        04/18/23 0906   04/18/23 0323  Urine Culture  Once,   URGENT       Question:  Indication  Answer:  Altered mental status (if no other cause identified)   04/18/23 0323            Vitals/Pain Today's Vitals   04/18/23 0947 04/18/23 1000 04/18/23 1515 04/18/23 1700  BP: (!) 158/101 (!) 144/75 (!) 170/88 (!) 158/75  Pulse: 75 74 71 77  Resp:  17 18 19   Temp:    97.9 F (36.6 C)  TempSrc:    Oral  SpO2:  100% 96% 99%  Weight:      Height:        Isolation  Precautions No active isolations  Medications Medications  sodium chloride  flush (NS) 0.9 % injection 3 mL (3 mLs Intravenous Given 04/18/23 0900)  sodium chloride  flush (NS) 0.9 % injection 3 mL (has no administration in time range)  0.9 %  sodium chloride  infusion (has no administration in time range)  hydrALAZINE  (APRESOLINE ) injection 5 mg (has no administration in time range)  cefTRIAXone  (ROCEPHIN ) 1 g in sodium chloride  0.9 % 100 mL IVPB (has no administration in time range)  metoprolol  tartrate (LOPRESSOR ) tablet 12.5 mg (12.5 mg Oral Given 04/18/23 0947)  fluticasone  furoate-vilanterol (BREO ELLIPTA ) 100-25 MCG/ACT 1 puff (1 puff Inhalation Not Given  04/18/23 0943)  albuterol  (PROVENTIL ) (2.5 MG/3ML) 0.083% nebulizer solution 2.5 mg (has no administration in time range)  aspirin  chewable tablet 81 mg (81 mg Oral Given 04/18/23 1512)  atorvastatin  (LIPITOR ) tablet 40 mg (has no administration in time range)  divalproex  (DEPAKOTE  SPRINKLE) capsule 125 mg (125 mg Oral Given 04/18/23 1512)  docusate sodium  (COLACE) capsule 100 mg (100 mg Oral Given 04/18/23 1515)  ferrous gluconate  (FERGON) tablet 324 mg (324 mg Oral Not Given 04/18/23 1522)  gabapentin  (NEURONTIN ) capsule 100 mg (100 mg Oral Given 04/18/23 1515)  hydrOXYzine  (ATARAX ) tablet 10 mg (has no administration in time range)  loratadine  (CLARITIN ) tablet 10 mg (10 mg Oral Given 04/18/23 1515)  Pancrelipase  (Lip-Prot-Amyl) 24000-76000 units CPEP 24,000 Units (24,000 Units Oral Not Given 04/18/23 1647)  pantoprazole  (PROTONIX ) EC tablet 40 mg (has no administration in time range)  sertraline  (ZOLOFT ) tablet 25 mg (25 mg Oral Given 04/18/23 1511)  insulin  glargine-yfgn (SEMGLEE ) injection 24 Units (has no administration in time range)  haloperidol  lactate (HALDOL ) injection 2 mg (2 mg Intravenous Given 04/18/23 1702)  cefTRIAXone  (ROCEPHIN ) 1 g in sodium chloride  0.9 % 100 mL IVPB (0 g Intravenous Stopped 04/18/23 0353)    Mobility non-ambulatory      Focused Assessments     R Recommendations: See Admitting Provider Note  Report given to:   Additional Notes:

## 2023-04-18 NOTE — H&P (Addendum)
 History and Physical    Patient: Justin [name] DOB: [birth year] DOA: 04/17/2023 DOS: the patient was seen and examined on 04/18/2023 PCP: Marston Junette HERO, MD  Patient coming from: SNF  Chief Complaint:  Chief Complaint  Patient presents with   Code Stroke   HPI: Justin [name] is a 89 y.o. male with medical history significant of 89 year old man history of DM type II, essential hypertension, hypercholesteremia, iron  deficiency anemia,  chronic pancreatitis, colonic adenocarcinoma in 2015, CKD stage IIIb, history of CVA with residual mild right-sided hemiparesis, TIA, GI Bleed, mixed vascular and alzheimer's dementia, and amputation of (R) toes. He presented to [facility] Emergency department for evaluation for altered mental status.  His family reported he was his normal self earlier today and patient's son last saw him around 6 PM 1/9.  Family currently concerned for worsening right-sided facial droop and slurred speech. The family is also concerned about his (L) great toe.  Patient is able to answer yes and no questions and participate in review of systems however majority of history obtained from daughter at bedside and chart review.  ED Course: On arrival to [facility] ED patient was noted to be afebrile temp 36.5C, BP 150/71, HR 74, RR 18, SpO2 99% on room air.  Labs notable for elevated blood glucose 203, creatinine 1.64 which appears to be in line with patient's baseline, albumin 3.1 which is improved from recent hospitalization in August, urinalysis with large leukocytes and a few bacteria and > 50 WBCs. CT head WO obtained as part of code stroke on arrival and shows no acute intracranial abnormality.  Patient evaluated by neurology who recommends MRI head, see neuro note.  Unable to obtain MRI at that time secondary to patient inability to remain still, due to waxing and waning mentation both neuro and ED physician did not want to give Ativan .  He was given 1 g ceftriaxone   and TRH contacted for admission.  While holding in the ED this morning patient had a run of ventricular tachycardia and ventricular bigeminy he was asymptomatic and hemodynamically stable.  Potassium was 4.1 this morning, magnesium  added to a.m. labs, and low-dose metoprolol  started.  Review of Systems: unable to review all systems due to the inability of the patient to answer questions. Past Medical History:  Diagnosis Date   Acute metabolic encephalopathy 05/24/2021   Arthritis    Diabetes mellitus    HTN (hypertension)    Hypercholesteremia    Iron  deficiency    Pancreatitis, acute 06/02/2012   Diagnosed 05/2012    Personal history of colonic adenomas 04/15/2013   04/15/2013 2 diminutive polyps     Stroke [facility])    TIA (transient ischemic attack)    Past Surgical History:  Procedure Laterality Date   ABDOMINAL AORTOGRAM W/LOWER EXTREMITY Right 11/24/2021   Procedure: ABDOMINAL AORTOGRAM W/LOWER EXTREMITY;  Surgeon: Magda Debby SAILOR, MD;  Location: [facility] INVASIVE CV LAB;  Service: Cardiovascular;  Laterality: Right;   AMPUTATION TOE Right 11/26/2021   Procedure: AMPUTATION TOE;  Surgeon: Gershon Donnice SAUNDERS, DPM;  Location: [facility] OR;  Service: Podiatry;  Laterality: Right;   ARTERY BIOPSY  02/26/2011   Procedure: BIOPSY TEMPORAL ARTERY;  Surgeon: Elsie JINNY Signs, MD;  Location:  SURGERY CENTER;  Service: General;  Laterality: Left;  Left temporal artery biospy   BACK SURGERY  1985   CHOLECYSTECTOMY     COLONOSCOPY     ESOPHAGOGASTRODUODENOSCOPY N/A 12/06/2022   Procedure: ESOPHAGOGASTRODUODENOSCOPY (EGD);  Surgeon: Cirigliano, Vito  V, DO;  Location: [facility] ENDOSCOPY;  Service: Gastroenterology;  Laterality: N/A;   EUS     HEMOSTASIS CLIP PLACEMENT  12/06/2022   Procedure: HEMOSTASIS CLIP PLACEMENT;  Surgeon: San Sandor GAILS, DO;  Location: [facility] ENDOSCOPY;  Service: Gastroenterology;;   HOT HEMOSTASIS N/A 12/06/2022   Procedure: HOT HEMOSTASIS (ARGON PLASMA COAGULATION/BICAP);  Surgeon:  San Sandor GAILS, DO;  Location: [facility] ENDOSCOPY;  Service: Gastroenterology;  Laterality: N/A;   NECK SURGERY  1985   PERIPHERAL VASCULAR BALLOON ANGIOPLASTY Right 11/24/2021   Procedure: PERIPHERAL VASCULAR BALLOON ANGIOPLASTY;  Surgeon: Magda Debby SAILOR, MD;  Location: [facility] INVASIVE CV LAB;  Service: Cardiovascular;  Laterality: Right;  POPLITEAL AND PERONEAL   TEMPORAL ARTERY BIOPSY / LIGATION  02/26/2011   left side   TRANSMETATARSAL AMPUTATION Right 01/28/2022   Procedure: TRANSMETATARSAL AMPUTATION; ACHILLES TENDON LENGTHENING;  Surgeon: Tobie Franky SQUIBB, DPM;  Location: [facility] ORS;  Service: Podiatry;  Laterality: Right;  R foot Transmetatarsal amputation, possible tendo achilles lengthening   UPPER GASTROINTESTINAL ENDOSCOPY     Social History:  reports that he quit smoking about 33 years ago. His smoking use included cigarettes. He started smoking about 68 years ago. He has never used smokeless tobacco. He reports that he does not drink alcohol  and does not use drugs.  No Known Allergies  Family History  Problem Relation Age of Onset   Heart disease Mother    Amblyopia Father    Diabetes Sister        x 2   Heart attack Sister     Prior to Admission medications   Medication Sig Start Date End Date Taking? Authorizing Provider  acetaminophen  (TYLENOL ) 325 MG tablet Take 650 mg by mouth daily.   Yes [provider]  aspirin  81 MG chewable tablet Chew 81 mg by mouth daily.   Yes [provider]  atorvastatin  (LIPITOR ) 40 MG tablet Take 40 mg by mouth at bedtime. 11/03/21  Yes [provider]  BREO ELLIPTA  100-25 MCG/ACT AEPB Inhale 1 puff into the lungs daily. 09/14/22  Yes [provider]  cefdinir  (OMNICEF ) 300 MG capsule Take 1 capsule (300 mg total) by mouth every evening. 11/09/22   Danton Reyes DASEN, MD  [facility] SALINE NOSE SPRAY NA Place 2 sprays into the nose 2 (two) times daily.    [provider]  divalproex  (DEPAKOTE  SPRINKLE) 125 MG capsule  Take 125 mg by mouth 2 (two) times daily. 10/27/22  Yes [provider]  docusate sodium  (COLACE) 100 MG capsule Take 100 mg by mouth 2 (two) times daily.   Yes [provider]  doxycycline  (VIBRA -TABS) 100 MG tablet Take 1 tablet (100 mg total) by mouth 2 (two) times daily. 02/06/23   Tobie Franky SQUIBB, DPM  Dulaglutide 1.5 MG/0.5ML SOPN Inject 1.5 mg into the skin once a week. tuesday    [provider]  ferrous gluconate  (FERGON) 324 MG tablet Take 324 mg by mouth daily.   Yes [provider]  gabapentin  (NEURONTIN ) 100 MG capsule Take 100 mg by mouth 2 (two) times daily. 10/29/22  Yes [provider]  glucagon  (GLUCAGON  EMERGENCY) 1 MG injection Inject 1 mg into the muscle once as needed. Patient taking differently: Inject 1 mg into the muscle once as needed (for low blood sugar). 09/29/15  Yes Trixie File, MD  hydrOXYzine  (ATARAX ) 10 MG tablet Take 10 mg by mouth 2 (two) times daily. hold for sedation    [provider]  insulin  glargine, 2 Unit Dial, (  TOUJEO  MAX SOLOSTAR) 300 UNIT/ML Solostar Pen Inject 19 Units into the skin daily.    [provider]  Ipratropium-Albuterol  (COMBIVENT) 20-100 MCG/ACT AERS respimat Inhale 1 puff into the lungs every 6 (six) hours as needed for wheezing. Patient taking differently: Inhale 1 puff into the lungs every 6 (six) hours as needed (for COPD). 05/25/21  Yes Bryn Bernardino NOVAK, MD  isosorbide  mononitrate (ISMO ) 10 MG tablet Take 10 mg by mouth at bedtime.    [provider]  loratadine  (CLARITIN ) 10 MG tablet Take 10 mg by mouth daily.   Yes [provider]  NOVOLOG  FLEXPEN 100 UNIT/ML FlexPen Inject 0-8 Units into the skin See admin instructions. Inject 0-8 units into the skin before lunch and supper, per sliding scale: if is BS 70-200 give 0 units, if BS is 201-250 give 2 units, if BS is 251-300 give 4 units, if BS is 301-350 give 6 units, if BS is 351-400 give 8 units. If blood  sugar is greater than 400, call MD. 10/18/21   [provider]  oxycodone  (OXY-IR) 5 MG capsule Take 1 capsule (5 mg total) by mouth every 8 (eight) hours as needed for pain. 12/08/22   Gherghe, Costin M, MD  oxymetazoline  (AFRIN) 0.05 % nasal spray Place 2 sprays into both nostrils 2 (two) times daily as needed for congestion.   Yes [provider]  Pancrelipase , Lip-Prot-Amyl, (CREON ) 24000-76000 units CPEP Take 1 capsule by mouth 3 (three) times daily. With 4-6 ounces of water   Yes [provider]  pantoprazole  (PROTONIX ) 40 MG tablet Take 1 tablet (40 mg total) by mouth daily. 12/08/22 12/08/23  Gherghe, Costin M, MD  Pollen Extracts (PROSTAT PO) Take 30 mLs by mouth 3 (three) times daily.    [provider]  repaglinide (PRANDIN) 0.5 MG tablet Take 0.5 mg by mouth 2 (two) times daily before a meal. 10/15/22   [provider]  sertraline  (ZOLOFT ) 25 MG tablet Take 25 mg by mouth daily.    [provider]  Skin Protectants, Misc. (MINERIN CREME EX) Apply 1 Application topically daily. Both legs and feet.    [provider]  Spacer/Aero-Holding Raguel FRENCH Use as directed with combivent. Ok to dispense any spacer that works with the inhaler being rxed to pt. 09/20/15   Loreli Elyn SAILOR, MD    Physical Exam: Vitals:   04/18/23 0735 04/18/23 0830 04/18/23 0947 04/18/23 1000  BP: (!) 159/65 (!) 158/89 (!) 158/101 (!) 144/75  Pulse: 67 68 75 74  Resp: 16 14  17   Temp: 97.7 F (36.5 C)     TempSrc: Oral     SpO2: 100% 98%  100%  Weight: 67 kg     Height: 6' 2 (1.88 m)      Constitutional: NAD, calm, comfortable Eyes: PERRL, lids and conjunctivae normal ENMT: Mucous membranes are moist. Posterior pharynx clear of any exudate or lesions.  Neck: normal, supple, no masses, no thyromegaly Respiratory: clear to auscultation bilaterally, no wheezing, no crackles. Normal respiratory effort. No accessory muscle use.  Cardiovascular: Regular  rate and rhythm, no murmurs / rubs / gallops. No extremity edema. 2+ pedal pulses. No carotid bruits.  Abdomen: no tenderness, no masses palpated. No hepatosplenomegaly. Bowel sounds positive.  Musculoskeletal: no clubbing / cyanosis. No joint deformity upper and lower extremities. Good ROM, no contractures. Normal muscle tone. Amputation of (R) toes. Skin: no rashes, lesions, ulcers. (L) hallux with necrotic changes. Neurologic: CN 2-12 grossly intact. Sensation intact.  Alert and oriented x person and place. Strength 4/5 x all 4 extremities.  Psychiatric: Normal judgment and insight.  Normal mood.   Data Reviewed: CBC    Component Value Date/Time   WBC 9.6 04/18/2023 0738   RBC 3.49 (L) 04/18/2023 0738   HGB 10.6 (L) 04/18/2023 0738   HCT 33.0 (L) 04/18/2023 0738   PLT 243 04/18/2023 0738   MCV 94.6 04/18/2023 0738   MCV 89.5 12/31/2013 2117   MCH 30.4 04/18/2023 0738   MCHC 32.1 04/18/2023 0738   RDW 16.4 (H) 04/18/2023 0738   LYMPHSABS 3.5 04/17/2023 2330   MONOABS 0.9 04/17/2023 2330   EOSABS 0.6 (H) 04/17/2023 2330   BASOSABS 0.1 04/17/2023 2330   CMP     Component Value Date/Time   NA 136 04/18/2023 0738   K 4.1 04/18/2023 0738   CL 106 04/18/2023 0738   CO2 21 (L) 04/18/2023 0738   GLUCOSE 162 (H) 04/18/2023 0738   BUN 25 (H) 04/18/2023 0738   CREATININE 1.49 (H) 04/18/2023 0738   CREATININE 2.01 (H) 12/20/2021 1434   CALCIUM  8.7 (L) 04/18/2023 0738   PROT 8.8 (H) 04/17/2023 2330   ALBUMIN 3.1 (L) 04/17/2023 2330   AST 22 04/17/2023 2330   ALT 14 04/17/2023 2330   ALKPHOS 75 04/17/2023 2330   BILITOT 0.4 04/17/2023 2330   EGFR 32 (L) 12/20/2021 1434   GFRNONAA 45 (L) 04/18/2023 0738   GFRNONAA 79 09/15/2015 1117   Alcohol  Level    Component Value Date/Time   ETH <10 04/17/2023 2330   Protime-INR    Component Value Date/Time   PROTHROMBIN TIME INR 14.1 1.1 04/17/2023 2330 04/17/2023 2330   APTT    Component Value Date/Time   APTT 29 04/17/2023  2330   Urinalysis    Component Value Date/Time   COLORURINE YELLOW 04/18/2023 0233   APPEARANCEUR HAZY (A) 04/18/2023 0233   LABSPEC 1.010 04/18/2023 0233   PHURINE 6.0 04/18/2023 0233   GLUCOSEU 50 (A) 04/18/2023 0233   HGBUR SMALL (A) 04/18/2023 0233   BILIRUBINUR NEGATIVE 04/18/2023 0233   BILIRUBINUR negative 09/27/2015 1629   BILIRUBINUR neg 07/01/2012 0902   KETONESUR NEGATIVE 04/18/2023 0233   PROTEINUR 100 (A) 04/18/2023 0233   UROBILINOGEN 4.0 09/27/2015 1629   NITRITE NEGATIVE 04/18/2023 0233   LEUKOCYTESUR LARGE (A) 04/18/2023 0233   Drugs of Abuse     Component Value Date/Time   LABOPIA NONE DETECTED 04/18/2023 0233   COCAINSCRNUR NONE DETECTED 04/18/2023 0233   LABBENZ NONE DETECTED 04/18/2023 0233   AMPHETMU NONE DETECTED 04/18/2023 0233   THCU NONE DETECTED 04/18/2023 0233   LABBARB NONE DETECTED 04/18/2023 0233     CT HEAD CODE STROKE WO CONTRAST Result Date: 04/17/2023 CLINICAL DATA:  Code stroke. Initial evaluation for acute neuro deficit, left facial droop. EXAM: CT HEAD WITHOUT CONTRAST TECHNIQUE: Contiguous axial images were obtained from the base of the skull through the vertex without intravenous contrast. RADIATION DOSE REDUCTION: This exam was performed according to the departmental dose-optimization program which includes automated exposure control, adjustment of the mA and/or kV according to patient size and/or use of iterative reconstruction technique. COMPARISON:  MRI from 01/17/2023. FINDINGS: Brain: Advanced age-related cerebral atrophy with chronic small vessel ischemic disease. Few scattered remote cerebellar infarcts noted. No acute intracranial hemorrhage. No acute large vessel territory infarct. No mass lesion or midline shift. Mild ventricular prominence related global parenchymal volume loss of hydrocephalus. No extra-axial fluid collection. Vascular: No abnormal hyperdense vessel. Calcified atherosclerosis present about  the skull base. Skull:  Scalp soft tissues within normal limits.  Calvarium intact. Sinuses/Orbits: Globes orbital soft tissues demonstrate no acute finding. Ocular senescent calcifications noted. Mild to moderate mucosal thickening present about the sphenoethmoidal and maxillary sinuses. Few superimposed air-fluid levels noted. Mastoid air cells are clear. Other: None. ASPECTS [facility] Stroke Program Early CT Score) - Ganglionic level infarction (caudate, lentiform nuclei, internal capsule, insula, M1-M3 cortex): 7 - Supraganglionic infarction (M4-M6 cortex): 3 Total score (0-10 with 10 being normal): 10 IMPRESSION: 1. No acute intracranial abnormality. 2. ASPECTS is 10. 3. Advanced age-related cerebral atrophy with chronic small vessel ischemic disease. Few scatter remote cerebellar infarcts. 4. Mucosal thickening with a few scattered air-fluid levels within the paranasal sinuses. Clinical correlation for possible acute sinusitis recommended. These results were communicated to Dr. Jerrie at 11:03 pm on 04/17/2023 by text page via the [facility] messaging system. Electronically Signed   By: Morene Hoard M.D.   On: 04/17/2023 23:04     Assessment and Plan: #Acute Metabolic Encephalopathy secondary to Urinary Tract Infection - Empirically covered with IV Rocephin , continue inpatient - Follow up on Urine culture  - Encourage PO hydration  #Rule out CVA - Hold home Isosorbide  until MRI results. If MRI positive for infarct will allow for permissive HTN - ECHO if MRI positive for stroke - Continue home ASA 81 mg  - A1C and LDL - Continue home statin - q4H neuro checks - STAT head CT for any change in neuro exam - Tele - Failed [facility] Swallow screen in ED; SLP Swallow evaluation - PT/OT  (L) Hallux Gangrene - MRI (L) foot - Consult Podiatry - Wound consult  #Ventricular Tachycardia #Ventricular Bigeminy Patient had a run of VT and then Ventricular Bigeminy in ED this morning. He was asymptomatic and hemodynamically  stable. - Low dose metoprolol  - Check Mg; K was 4.1 this morning. Replete PRN for goal K>4 and Mg >2  #History of hypertension -Hold isosorbide  pending MRI  #History of diabetes #Neuropathy - Continue Lantus  and sliding scale hold repaglinide - Continue gabapentin   #History of hyperlipidemia -Continue Lipitor   #History of Chronic Pancreatitis - Continue home Creon    #History of CKD, near baseline - Continue monitor BMP   #History of COPD - Continue Breo Ellipta , Combivent, Claritin  - PRN nebulizer while inpatient   Advance Care Planning:   Code Status: Limited: Do not attempt resuscitation (DNR) -DNR-LIMITED -Do Not Intubate/DNI    Consults: Neurology, Palliative Care, Dr Malvin Podiatry  Family Communication: Daughter at bedside  Severity of Illness: The appropriate patient status for this patient is INPATIENT. Inpatient status is judged to be reasonable and necessary in order to provide the required intensity of service to ensure the patient's safety. The patient's presenting symptoms, physical exam findings, and initial radiographic and laboratory data in the context of their chronic comorbidities is felt to place them at high risk for further clinical deterioration. Furthermore, it is not anticipated that the patient will be medically stable for discharge from the hospital within 2 midnights of admission.   * I certify that at the point of admission it is my clinical judgment that the patient will require inpatient hospital care spanning beyond 2 midnights from the point of admission due to high intensity of service, high risk for further deterioration and high frequency of surveillance required.*  To reach the provider On-Call:   7AM- 7PM see care teams to locate the attending and reach out to them via [email or web address]. Password: [email or web address] 7PM-7AM contact night-coverage  If you still have difficulty reaching the appropriate provider, please page the [facility] (Director on Call) for  [facility] on amion for assistance  This document was prepared using Conservation officer, historic buildings and may include unintentional dictation errors.  Rockie Rams FNP-BC, PMHNP-BC Nurse Practitioner [facility]

## 2023-04-18 NOTE — Consult Note (Signed)
 WOC Nurse Consult Note: Reason for Consult: LEft great toe necrotic. LEft third toe with chronic nonhealing vascular wound.   *AWaiting left great toe amputation 04/20/23 if no vascular intervention is needed.  History of right transmetatarsal amputation Wound type: vascular Pressure Injury POA: NA Measurement: LEft third toe:  3 cm x 1 cm x 0.1 cm nonhealing vascular wound  Seen at wound care center.  Current treatment includes silver collagen.  LEft great toe is dry and necrotic Wound azi:uypmi toe with wet lesion, pale in color Drainage (amount, consistency, odor) minimal serosanguinous   Periwound: dry skin Dressing procedure/placement/frequency: Awaiting vascular consult with amputation left great toe recommended.  Cleanse wound to left third toe with VASHE cleanser.  Apply VASHE moist 2x2 and wrap with kerlix and tape. Change daily.  Will not follow at this time.  Please re-consult if needed.  Darice Cooley MSN, RN, FNP-BC CWON Wound, Ostomy, Continence Nurse Outpatient [facility] [phone] Pager [phone]

## 2023-04-18 NOTE — ED Notes (Signed)
 5W notified. I spoke with Chanel and let them know patient would be transported up. Verbalized understanding.

## 2023-04-18 NOTE — ED Provider Notes (Signed)
 Patient signed out to me by Dr. Ula.  Patient seen initially as a code stroke due to altered mental status and history of CVA.  Initial evaluation, however, did not show focal abnormality.  Screening CT unremarkable.  Although patient was fairly somnolent when not being stimulated, was not able to undergo MRI because he became agitated in the machine.  Due to his underlying mental status changes and somnolence at baseline, sedation not an option.  Neurology aware that MRI cannot be performed.  Blood work unremarkable.  Urinalysis ultimately shows signs of infection.  Most recent micro was in July of this year, Rocephin  sensitive Proteus.  Given Rocephin  and will admit to medicine.   Haze Lonni PARAS, MD 04/18/23 [phone]

## 2023-04-18 NOTE — Code Documentation (Signed)
 Responded to Code Stroke called on pt already in the ED for AMS. Code Stroke called at 2245 for R facial droop and slurred speech, LSN-2000, CBG-200, NIH-9, CT head negative for acute changes. TNK not given-too good to treat, recent GIB. Plan metabolic workup/MRI. Please complete VS/neuro checks q2h x 12, then q4h.

## 2023-04-18 NOTE — ED Notes (Signed)
 Patient transported to MRI

## 2023-04-18 NOTE — Evaluation (Signed)
 Physical Therapy Evaluation Patient Details Name: Justin [name] MRN: [id number] DOB: 03-07-1935 Today's Date: 04/18/2023  History of Present Illness  Pt is an 89 y.o. male who presented 04/17/23 with concerns for worsening slurred speech and R-sided facial droop. MRI brain with no evidence of acute CVA. Admitted for acute metabolic encephalopathy due to urosepsis. 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   Clinical Impression  Pt presents with condition above and deficits mentioned below, see PT Problem List. Per his daughter, the pt is from long-term care at a SNF with his wife. At baseline, he required a hoyer lift transfer to get to/from his w/c, but he is mod I with w/c mobility utilizing his upper extremities. Currently, pt appears to be functioning near his baseline, needing maxA for bed mobility and to transfer to stand with his knees blocked. He demonstrates deficits in cognition, balance, strength, power, and activity tolerance. He tends to lean posteriorly when sitting and especially when standing, placing him at high risk for falls. Recommending pt return to his SNF at d/c. Will continue to follow acutely to promote OOB mobility and w/c mobility while here as able.        If plan is discharge home, recommend the following: Two people to help with walking and/or transfers;A lot of help with bathing/dressing/bathroom;Assistance with cooking/housework;Direct supervision/assist for medications management;Direct supervision/assist for financial management;Assistance with feeding;Assist for transportation;Help with stairs or ramp for entrance;Supervision due to cognitive status   Can travel by private vehicle   No    Equipment Recommendations None recommended by PT (defer to next venue of care)  Recommendations for Other Services        Functional Status Assessment Patient has had a recent decline in their functional status and/or demonstrates limited ability to make significant improvements in function in a reasonable and predictable amount of time     Precautions / Restrictions Precautions Precautions: Fall Precaution Comments: prior R TMA Restrictions Weight Bearing Restrictions Per Provider Order: No (No weight bearing restrictions per verbal order by Dr. Sherlon 04/18/23)      Mobility  Bed Mobility Overal bed mobility: Needs Assistance Bed Mobility: Supine to Sit, Sit to Supine     Supine to sit: Max assist, HOB elevated Sit to supine: Max assist, HOB elevated   General bed mobility comments: Cues provided to bring bil legs off R EOB, needing assistance to do so. MaxA also needed to ascend trunk and scoot hips to EOB to sit R EOB. MaxA needed to lift leg and direct trunk back to supine.    Transfers Overall transfer level: Needs assistance Equipment used: Rolling walker (2 wheels), 1 person hand held assist Transfers: Sit to/from Stand Sit to Stand: Max assist, From elevated surface           General transfer comment: Pt stood 1x from edge of stretcher to RW, needing extensive cues to place feet wider and more posteriorly. Pt stood 2x from edge of stretcher to [facility] with face-to-face approach and bil knees blocked. Improved facilitation of hip extension with face-to-face approach. Posterior lean noted, maxA needed to stand each rep    Ambulation/Gait               General Gait Details: unable at baseline  Careers Information Officer  Tilt Bed    Modified Rankin (Stroke Patients Only) Modified Rankin (Stroke Patients Only) Pre-Morbid Rankin Score: Severe disability Modified Rankin: Severe disability     Balance Overall balance assessment: Needs assistance Sitting-balance support: Bilateral upper extremity supported, Feet supported Sitting balance-Leahy Scale:  Poor Sitting balance - Comments: UE support and minA needed to sit EOB, posterior lean noted. R lean also noted but pt's bed legs would not go flat so may be due to angle of bed. Postural control: Posterior lean Standing balance support: Bilateral upper extremity supported, During functional activity, Reliant on assistive device for balance Standing balance-Leahy Scale: Poor Standing balance comment: Posterior lean, maxA and bil knee blockage to stand with UE support                             Pertinent Vitals/Pain Pain Assessment Pain Assessment: Faces Faces Pain Scale: Hurts little more Pain Location: L toe Pain Descriptors / Indicators: Discomfort, Grimacing, Guarding, Sore Pain Intervention(s): Limited activity within patient's tolerance, Monitored during session, Repositioned    Home Living Family/patient expects to be discharged to:: Skilled nursing facility                   Additional Comments: pt from [facility] nursing facility    Prior Function Prior Level of Function : Needs assist;Patient poor historian/Family not available             Mobility Comments: Daughter reports pt is dependent on staff to perform hoyer lift transfers OOB to w/c, but pt is able to propel the w/c mod I with his UEs, rarely uses his legs ADLs Comments: Daughter reports pt is dependent on staff for bed level ADLs; intermittently needs assistance with feeding as well     Extremity/Trunk Assessment   Upper Extremity Assessment Upper Extremity Assessment: Defer to OT evaluation    Lower Extremity Assessment Lower Extremity Assessment: RLE deficits/detail;Generalized weakness;LLE deficits/detail RLE Deficits / Details: gross weakness; hx of R TMA; reports hx of peripheral neuropathy RLE Sensation: history of peripheral neuropathy LLE Deficits / Details: gross weakness; distal L great toe black with gangrene; reports hx of peripheral neuropathy LLE Sensation: history of  peripheral neuropathy    Cervical / Trunk Assessment Cervical / Trunk Assessment: Kyphotic  Communication   Communication Communication: No apparent difficulties  Cognition Arousal: Alert Behavior During Therapy: Flat affect Overall Cognitive Status: History of cognitive impairments - at baseline                                 General Comments: Hx of Alzheimer's dementia. Pt reported he lives at home with his wife but daughter reports they live at a SNF. Slow to process cues and inconsistent in following simple multi-modal cues. Poor awareness of his lean affecting safety        General Comments      Exercises     Assessment/Plan    PT Assessment Patient needs continued PT services  PT Problem List Decreased strength;Decreased activity tolerance;Decreased balance;Decreased mobility;Decreased cognition       PT Treatment Interventions DME instruction;Functional mobility training;Therapeutic activities;Therapeutic exercise;Balance training;Neuromuscular re-education;Cognitive remediation;Patient/family education;Wheelchair mobility training    PT Goals (Current goals can be found in the Care Plan section)  Acute Rehab PT Goals Patient Stated Goal: did not state PT Goal Formulation: With patient/family Time For Goal Achievement: 05/02/23 Potential to Achieve Goals: Fair  Frequency Min 1X/week     Co-evaluation               AM-PAC PT 6 Clicks Mobility  Outcome Measure Help needed turning from your back to your side while in a flat bed without using bedrails?: A Lot Help needed moving from lying on your back to sitting on the side of a flat bed without using bedrails?: A Lot Help needed moving to and from a bed to a chair (including a wheelchair)?: Total Help needed standing up from a chair using your arms (e.g., wheelchair or bedside chair)?: A Lot Help needed to walk in hospital room?: Total Help needed climbing 3-5 steps with a railing? :  Total 6 Click Score: 9    End of Session Equipment Utilized During Treatment: Gait belt Activity Tolerance: Patient tolerated treatment well Patient left: in bed;with call bell/phone within reach;with family/visitor present   PT Visit Diagnosis: Unsteadiness on feet (R26.81);Muscle weakness (generalized) (M62.81);Difficulty in walking, not elsewhere classified (R26.2)    Time: 8457-8398 PT Time Calculation (min) (ACUTE ONLY): 19 min   Charges:   PT Evaluation $PT Eval Moderate Complexity: 1 Mod   PT General Charges $$ ACUTE PT VISIT: 1 Visit         Theo Ferretti, PT, DPT Acute Rehabilitation Services  Office: [phone]   Theo CHRISTELLA Ferretti 04/18/2023, 5:41 PM

## 2023-04-19 ENCOUNTER — Observation Stay (HOSPITAL_COMMUNITY): Payer: Medicare Other

## 2023-04-19 ENCOUNTER — Encounter (HOSPITAL_COMMUNITY)
Admission: EM | Disposition: A | Discharge status: Skilled Nursing Facility | Payer: Self-pay | Source: Skilled Nursing Facility | Attending: Internal Medicine

## 2023-04-19 ENCOUNTER — Inpatient Hospital Stay (HOSPITAL_COMMUNITY): Payer: Medicare Other

## 2023-04-19 DIAGNOSIS — N1832 Chronic kidney disease, stage 3b: Secondary | ICD-10-CM | POA: Diagnosis present

## 2023-04-19 DIAGNOSIS — F0150 Vascular dementia without behavioral disturbance: Secondary | ICD-10-CM | POA: Diagnosis present

## 2023-04-19 DIAGNOSIS — L97529 Non-pressure chronic ulcer of other part of left foot with unspecified severity: Secondary | ICD-10-CM | POA: Diagnosis present

## 2023-04-19 DIAGNOSIS — K861 Other chronic pancreatitis: Secondary | ICD-10-CM

## 2023-04-19 DIAGNOSIS — I96 Gangrene, not elsewhere classified: Secondary | ICD-10-CM | POA: Diagnosis not present

## 2023-04-19 DIAGNOSIS — R4182 Altered mental status, unspecified: Secondary | ICD-10-CM | POA: Diagnosis present

## 2023-04-19 DIAGNOSIS — I69351 Hemiplegia and hemiparesis following cerebral infarction affecting right dominant side: Secondary | ICD-10-CM | POA: Diagnosis not present

## 2023-04-19 DIAGNOSIS — J449 Chronic obstructive pulmonary disease, unspecified: Secondary | ICD-10-CM | POA: Diagnosis present

## 2023-04-19 DIAGNOSIS — I70262 Atherosclerosis of native arteries of extremities with gangrene, left leg: Secondary | ICD-10-CM | POA: Diagnosis present

## 2023-04-19 DIAGNOSIS — Z515 Encounter for palliative care: Secondary | ICD-10-CM | POA: Diagnosis not present

## 2023-04-19 DIAGNOSIS — E1122 Type 2 diabetes mellitus with diabetic chronic kidney disease: Secondary | ICD-10-CM

## 2023-04-19 DIAGNOSIS — E1152 Type 2 diabetes mellitus with diabetic peripheral angiopathy with gangrene: Secondary | ICD-10-CM | POA: Diagnosis present

## 2023-04-19 DIAGNOSIS — G309 Alzheimer's disease, unspecified: Secondary | ICD-10-CM | POA: Diagnosis present

## 2023-04-19 DIAGNOSIS — E1140 Type 2 diabetes mellitus with diabetic neuropathy, unspecified: Secondary | ICD-10-CM | POA: Diagnosis present

## 2023-04-19 DIAGNOSIS — Z794 Long term (current) use of insulin: Secondary | ICD-10-CM

## 2023-04-19 DIAGNOSIS — E1169 Type 2 diabetes mellitus with other specified complication: Secondary | ICD-10-CM | POA: Diagnosis present

## 2023-04-19 DIAGNOSIS — I472 Ventricular tachycardia, unspecified: Secondary | ICD-10-CM | POA: Diagnosis present

## 2023-04-19 DIAGNOSIS — I129 Hypertensive chronic kidney disease with stage 1 through stage 4 chronic kidney disease, or unspecified chronic kidney disease: Secondary | ICD-10-CM | POA: Diagnosis present

## 2023-04-19 DIAGNOSIS — M869 Osteomyelitis, unspecified: Secondary | ICD-10-CM | POA: Diagnosis present

## 2023-04-19 DIAGNOSIS — N179 Acute kidney failure, unspecified: Secondary | ICD-10-CM | POA: Diagnosis present

## 2023-04-19 DIAGNOSIS — G9341 Metabolic encephalopathy: Secondary | ICD-10-CM | POA: Diagnosis present

## 2023-04-19 DIAGNOSIS — Z7189 Other specified counseling: Secondary | ICD-10-CM | POA: Diagnosis not present

## 2023-04-19 DIAGNOSIS — G9340 Encephalopathy, unspecified: Secondary | ICD-10-CM | POA: Diagnosis not present

## 2023-04-19 DIAGNOSIS — F0280 Dementia in other diseases classified elsewhere without behavioral disturbance: Secondary | ICD-10-CM | POA: Diagnosis present

## 2023-04-19 DIAGNOSIS — E440 Moderate protein-calorie malnutrition: Secondary | ICD-10-CM | POA: Diagnosis present

## 2023-04-19 DIAGNOSIS — N390 Urinary tract infection, site not specified: Secondary | ICD-10-CM | POA: Diagnosis present

## 2023-04-19 DIAGNOSIS — Z681 Body mass index (BMI) 19 or less, adult: Secondary | ICD-10-CM | POA: Diagnosis not present

## 2023-04-19 DIAGNOSIS — E1165 Type 2 diabetes mellitus with hyperglycemia: Secondary | ICD-10-CM | POA: Diagnosis present

## 2023-04-19 DIAGNOSIS — Z66 Do not resuscitate: Secondary | ICD-10-CM | POA: Diagnosis present

## 2023-04-19 HISTORY — PX: PERIPHERAL VASCULAR BALLOON ANGIOPLASTY: CATH118281

## 2023-04-19 HISTORY — PX: ABDOMINAL AORTOGRAM W/LOWER EXTREMITY: CATH118223

## 2023-04-19 LAB — PROCALCITONIN: Procalcitonin: <0.1 ng/mL

## 2023-04-19 LAB — BASIC METABOLIC PANEL
Anion gap: 11 (ref 5–15)
BUN: 19 mg/dL (ref 8–23)
CO2: 21 mmol/L — ABNORMAL LOW (ref 22–32)
Calcium: 8.6 mg/dL — ABNORMAL LOW (ref 8.9–10.3)
Chloride: 104 mmol/L (ref 98–111)
Creatinine, Ser: 1.31 mg/dL — ABNORMAL HIGH (ref 0.61–1.24)
GFR, Estimated: 53 mL/min — ABNORMAL LOW (ref >60)
Glucose, Bld: 139 mg/dL — ABNORMAL HIGH (ref 70–99)
Potassium: 3.6 mmol/L (ref 3.5–5.1)
Sodium: 136 mmol/L (ref 135–145)

## 2023-04-19 LAB — GLUCOSE, CAPILLARY
Glucose-Capillary: 105 mg/dL — ABNORMAL HIGH (ref 70–99)
Glucose-Capillary: 110 mg/dL — ABNORMAL HIGH (ref 70–99)
Glucose-Capillary: 96 mg/dL (ref 70–99)

## 2023-04-19 LAB — CBC
HCT: 33.4 % — ABNORMAL LOW (ref 39.0–52.0)
Hemoglobin: 11 g/dL — ABNORMAL LOW (ref 13.0–17.0)
MCH: 30.1 pg (ref 26.0–34.0)
MCHC: 32.9 g/dL (ref 30.0–36.0)
MCV: 91.5 fL (ref 80.0–100.0)
Platelets: 252 10*3/uL (ref 150–400)
RBC: 3.65 MIL/uL — ABNORMAL LOW (ref 4.22–5.81)
RDW: 16.3 % — ABNORMAL HIGH (ref 11.5–15.5)
WBC: 9.4 10*3/uL (ref 4.0–10.5)
nRBC: 0 % (ref 0.0–0.2)

## 2023-04-19 LAB — MAGNESIUM: Magnesium: 1.6 mg/dL — ABNORMAL LOW (ref 1.7–2.4)

## 2023-04-19 LAB — POCT ACTIVATED CLOTTING TIME
Activated Clotting Time: 239 s
Activated Clotting Time: 181 s
Activated Clotting Time: 187 s
Activated Clotting Time: 193 s

## 2023-04-19 SURGERY — ABDOMINAL AORTOGRAM W/LOWER EXTREMITY
Anesthesia: LOCAL | Laterality: Left

## 2023-04-19 MED ORDER — SODIUM CHLORIDE 0.9% FLUSH: 3.0000 mL | INTRAVENOUS | Status: DC | PRN

## 2023-04-19 MED ORDER — ENSURE ENLIVE PO LIQD
237.0000 mL | Freq: Three times a day (TID) | ORAL | Status: DC
  Administered 2023-04-19 – 2023-04-23 (×9): 237 mL via ORAL

## 2023-04-19 MED ORDER — POTASSIUM CHLORIDE 10 MEQ/100ML IV SOLN: 10.0000 meq | INTRAVENOUS | Status: AC

## 2023-04-19 MED ORDER — IODIXANOL 320 MG/ML IV SOLN
INTRAVENOUS | Status: DC | PRN
  Administered 2023-04-19: 40 mL

## 2023-04-19 MED ORDER — HEPARIN SODIUM (PORCINE) 1000 UNIT/ML IJ SOLN
INTRAMUSCULAR | Status: AC
  Filled 2023-04-19: qty 10

## 2023-04-19 MED ORDER — HYDRALAZINE HCL 20 MG/ML IJ SOLN: 5.0000 mg | INTRAMUSCULAR | Status: DC | PRN

## 2023-04-19 MED ORDER — ADULT MULTIVITAMIN W/MINERALS CH: 1.0000 | ORAL_TABLET | Freq: Every day | ORAL | Status: DC

## 2023-04-19 MED ORDER — LORAZEPAM 2 MG/ML IJ SOLN
1.0000 mg | Freq: Once | INTRAMUSCULAR | Status: AC | PRN
  Administered 2023-04-20: 1 mg via INTRAVENOUS
  Filled 2023-04-19: qty 1

## 2023-04-19 MED ORDER — INSULIN GLARGINE-YFGN 100 UNIT/ML ~~LOC~~ SOLN
10.0000 [IU] | Freq: Every day | SUBCUTANEOUS | Status: DC
  Administered 2023-04-20: 10 [IU] via SUBCUTANEOUS
  Filled 2023-04-19 (×2): qty 0.1

## 2023-04-19 MED ORDER — CLOPIDOGREL BISULFATE 300 MG PO TABS
ORAL_TABLET | ORAL | Status: AC
  Filled 2023-04-19: qty 1

## 2023-04-19 MED ORDER — SODIUM CHLORIDE 0.9 % WEIGHT BASED INFUSION
1.0000 mL/kg/h | INTRAVENOUS | Status: AC
Start: 2023-04-19 — End: 2023-04-20
  Administered 2023-04-19: 1 mL/kg/h via INTRAVENOUS

## 2023-04-19 MED ORDER — HEPARIN SODIUM (PORCINE) 1000 UNIT/ML IJ SOLN
INTRAMUSCULAR | Status: DC | PRN
  Administered 2023-04-19: 7000 [IU] via INTRAVENOUS

## 2023-04-19 MED ORDER — INSULIN GLARGINE-YFGN 100 UNIT/ML ~~LOC~~ SOLN
20.0000 [IU] | Freq: Every day | SUBCUTANEOUS | Status: DC
  Filled 2023-04-19: qty 0.2

## 2023-04-19 MED ORDER — HEPARIN (PORCINE) IN NACL 1000-0.9 UT/500ML-% IV SOLN
INTRAVENOUS | Status: DC | PRN
  Administered 2023-04-19 (×2): 500 mL

## 2023-04-19 MED ORDER — ADULT MULTIVITAMIN W/MINERALS CH
1.0000 | ORAL_TABLET | Freq: Every day | ORAL | Status: DC
  Administered 2023-04-19 – 2023-04-23 (×4): 1 via ORAL
  Filled 2023-04-19 (×5): qty 1

## 2023-04-19 MED ORDER — LIDOCAINE HCL (PF) 1 % IJ SOLN
INTRAMUSCULAR | Status: DC | PRN
  Administered 2023-04-19: 10 mL

## 2023-04-19 MED ORDER — ZINC SULFATE 220 (50 ZN) MG PO CAPS
220.0000 mg | ORAL_CAPSULE | Freq: Every day | ORAL | Status: DC
  Administered 2023-04-19 – 2023-04-23 (×3): 220 mg via ORAL
  Filled 2023-04-19 (×5): qty 1

## 2023-04-19 MED ORDER — ONDANSETRON HCL 4 MG/2ML IJ SOLN: 4.0000 mg | Freq: Four times a day (QID) | INTRAMUSCULAR | Status: DC | PRN

## 2023-04-19 MED ORDER — LIDOCAINE HCL (PF) 1 % IJ SOLN
INTRAMUSCULAR | Status: AC
  Filled 2023-04-19: qty 30

## 2023-04-19 MED ORDER — SODIUM CHLORIDE 0.9% FLUSH
3.0000 mL | Freq: Two times a day (BID) | INTRAVENOUS | Status: DC
  Administered 2023-04-21 – 2023-04-23 (×2): 3 mL via INTRAVENOUS

## 2023-04-19 MED ORDER — MAGNESIUM SULFATE 2 GM/50ML IV SOLN: 2.0000 g | Freq: Once | INTRAVENOUS | Status: DC

## 2023-04-19 MED ORDER — ACETAMINOPHEN 325 MG PO TABS
650.0000 mg | ORAL_TABLET | ORAL | Status: DC | PRN
  Administered 2023-04-20 – 2023-04-22 (×3): 650 mg via ORAL
  Filled 2023-04-19 (×3): qty 2

## 2023-04-19 MED ORDER — CLOPIDOGREL BISULFATE 75 MG PO TABS
75.0000 mg | ORAL_TABLET | Freq: Every day | ORAL | Status: DC
  Administered 2023-04-21 – 2023-04-23 (×3): 75 mg via ORAL
  Filled 2023-04-19 (×4): qty 1

## 2023-04-19 MED ORDER — LACTATED RINGERS IV SOLN: INTRAVENOUS | Status: DC

## 2023-04-19 MED ORDER — INSULIN ASPART 100 UNIT/ML IJ SOLN
0.0000 [IU] | Freq: Three times a day (TID) | INTRAMUSCULAR | Status: DC
  Administered 2023-04-21: 1 [IU] via SUBCUTANEOUS
  Administered 2023-04-21: 2 [IU] via SUBCUTANEOUS
  Administered 2023-04-21: 1 [IU] via SUBCUTANEOUS
  Administered 2023-04-22: 2 [IU] via SUBCUTANEOUS
  Administered 2023-04-23: 1 [IU] via SUBCUTANEOUS
  Administered 2023-04-23: 2 [IU] via SUBCUTANEOUS

## 2023-04-19 MED ORDER — ENOXAPARIN SODIUM 40 MG/0.4ML IJ SOSY
40.0000 mg | PREFILLED_SYRINGE | INTRAMUSCULAR | Status: DC
  Administered 2023-04-19: 40 mg via SUBCUTANEOUS
  Filled 2023-04-19: qty 0.4

## 2023-04-19 MED ORDER — HALOPERIDOL LACTATE 5 MG/ML IJ SOLN
2.0000 mg | Freq: Four times a day (QID) | INTRAMUSCULAR | Status: DC | PRN
  Filled 2023-04-19: qty 1

## 2023-04-19 MED ORDER — VITAMIN C 500 MG PO TABS
500.0000 mg | ORAL_TABLET | Freq: Every day | ORAL | Status: DC
  Administered 2023-04-19 – 2023-04-23 (×4): 500 mg via ORAL
  Filled 2023-04-19 (×5): qty 1

## 2023-04-19 MED ORDER — LABETALOL HCL 5 MG/ML IV SOLN: 10.0000 mg | INTRAVENOUS | Status: DC | PRN

## 2023-04-19 MED ORDER — INSULIN ASPART 100 UNIT/ML IJ SOLN: 0.0000 [IU] | Freq: Every day | INTRAMUSCULAR | Status: DC

## 2023-04-19 MED ORDER — ISOSORBIDE MONONITRATE 20 MG PO TABS
10.0000 mg | ORAL_TABLET | Freq: Every day | ORAL | Status: DC
  Administered 2023-04-20 – 2023-04-22 (×4): 10 mg via ORAL
  Filled 2023-04-19 (×6): qty 1

## 2023-04-19 MED ORDER — SODIUM CHLORIDE 0.9 % IV SOLN: 250.0000 mL | INTRAVENOUS | Status: DC | PRN

## 2023-04-19 MED ORDER — CLOPIDOGREL BISULFATE 75 MG PO TABS
300.0000 mg | ORAL_TABLET | Freq: Once | ORAL | Status: AC
  Administered 2023-04-19: 300 mg via ORAL

## 2023-04-19 SURGICAL SUPPLY — 18 items
BALLN STERLING OTW 3X150X150 (BALLOONS) ×1 IMPLANT
BALLOON STERLING OTW 3X150X150 (BALLOONS) IMPLANT
CATH CROSS OVER TEMPO 5F (CATHETERS) IMPLANT
CATH OMNI FLUSH 5F 65CM (CATHETERS) IMPLANT
CATH TEMPO AQUA 5F 100CM (CATHETERS) IMPLANT
COVER DOME SNAP 22 D (MISCELLANEOUS) IMPLANT
GUIDEWIRE ANGLED .035X150CM (WIRE) IMPLANT
KIT MICROPUNCTURE NIT STIFF (SHEATH) IMPLANT
KIT SINGLE USE MANIFOLD (KITS) IMPLANT
KIT SYRINGE INJ CVI SPIKEX1 (MISCELLANEOUS) IMPLANT
SET ATX-X65L (MISCELLANEOUS) IMPLANT
SHEATH CATAPULT 5FR 60 (SHEATH) IMPLANT
SHEATH PINNACLE 5F 10CM (SHEATH) IMPLANT
SHEATH PROBE COVER 6X72 (BAG) IMPLANT
TRAY PV CATH (CUSTOM PROCEDURE TRAY) ×1 IMPLANT
WIRE BENTSON .035X145CM (WIRE) IMPLANT
WIRE G V18X300CM (WIRE) IMPLANT
WIRE HI TORQ VERSACORE 300 (WIRE) IMPLANT

## 2023-04-19 NOTE — Progress Notes (Signed)
 PROGRESS NOTE    Justin [name]  FMW:[id number] DOB: 1935-02-15 DOA: 04/17/2023 PCP: Marston Junette HERO, MD   Chief Complaint  Patient presents with   Code Stroke    Brief Narrative:    89 year old man history of DM type II, essential hypertension, hypercholesteremia, iron  deficiency anemia,  chronic pancreatitis, colonic adenocarcinoma in 2015, CKD stage IIIb, history of CVA with residual mild right-sided hemiparesis, TIA, GI Bleed, mixed vascular and alzheimer's dementia, left TMA , patient is SNF resident for last 4 years, he is wheelchair dependent.  patient was sent from facility given altered mental status, and concern for facial droop, patient is more awake and conversant upon my evaluation, but it does appear he is with advanced dementia,  in ED MRI brain rule out acute CVA, he was noted to have right great toe dry gangrene wound, his workup was significant for UTI, [facility] hospitalist consulted to admit     Assessment & Plan:   Principal Problem:   Acute metabolic encephalopathy Active Problems:   Chronic kidney disease, stage 3b ([name])   Type 2 diabetes mellitus with stage 3b chronic kidney disease, with long-term current use of insulin  ([name])   Chronic pancreatitis ([name])   Essential hypertension   Mixed diabetic hyperlipidemia associated with type 2 diabetes mellitus ([name])   Altered mental status   Cystitis   Acute metabolic encephalopathy due to urosepsis -Likely due to infectious process, including UTI -MRI brain with no evidence of acute CVA -Patient back to baseline  UTI - Continue with IV Rocephin , follow urine cultures   Left hallux gangrene PAD -Patient with known peripheral vascular disease, it does appear great toe with gangrene, but does not appear to be infected, MRI is pending, podiatry has been consulted -Patient with known peripheral vascular disease, been following with Dr. Vonzell -Plan for angiogram by vascular surgery today, in anticipation of great  toe amputation by podiatry   Episode of NSVT while in ED -Resolved without intervention, symptomatic, serum within normal limit,  started on low-dose metoprolol  -correct  hypomagnesemia -Continue to monitor on telemetry  Hypomagnesemia -Magnesium  of 1.6, replaced   CKD 3B  -Renal function at baseline  Hyperlipidemia -continue Lipitor   Hypertension -continue Imdur   History of COPD -continue inhalers, respiratory status stable and at baseline  History of Chronic Pancreatitis - Continue home Creon   History of diabetes Neuropathy - Continue Lantus  and sliding scale hold repaglinide - Continue gabapentin    DVT prophylaxis: Lovenox  Code Status: DNR Family Communication:  D/W daughter at bedside yesterday Disposition: Back to [facility]     Consultants:  [facility] Vascualr podiatry   Subjective:  Due to some confusion and restlessness overnight, patient pleasantly demented, did not provide any complaints  Objective: Vitals:   04/19/23 0600 04/19/23 0700 04/19/23 0800 04/19/23 0900  BP: (!) 164/79 (!) 147/70 (!) 151/81 125/66  Pulse: 73 78 86   Resp: 18 16 12 15   Temp:   98.4 F (36.9 C)   TempSrc:   Oral   SpO2: 100% 100% 100%   Weight:      Height:        Intake/Output Summary (Last 24 hours) at 04/19/2023 1002 Last data filed at 04/19/2023 0927 Gross per 24 hour  Intake 0 ml  Output 800 ml  Net -800 ml   Filed Weights   04/18/23 0735 04/18/23 2100  Weight: 67 kg 69.8 kg    Examination:  Awake frail, deconditioned, Symmetrical Chest wall movement, Good air movement bilaterally, CTAB RRR,No Gallops,Rubs  or new Murmurs, No Parasternal Heave +ve B.Sounds, Abd Soft, No tenderness, No rebound - guarding or rigidity. Left TMA, right great toe gangrene    Data Reviewed: I have personally reviewed following labs and imaging studies  CBC: Recent Labs  Lab 04/17/23 2328 04/17/23 2330 04/18/23 0738 04/19/23 0525  WBC  --  9.5 9.6 9.4  NEUTROABS  --   4.2  --   --   HGB 11.9* 11.0* 10.6* 11.0*  HCT 35.0* 33.9* 33.0* 33.4*  MCV  --  93.4 94.6 91.5  PLT  --  244 243 252    Basic Metabolic Panel: Recent Labs  Lab 04/17/23 2328 04/17/23 2330 04/18/23 0738 04/18/23 2218 04/19/23 0525  NA 141 136 136  --  136  K 4.1 4.0 4.1  --  3.6  CL 107 106 106  --  104  CO2  --  22 21*  --  21*  GLUCOSE 200* 203* 162*  --  139*  BUN 30* 27* 25*  --  19  CREATININE 1.70* 1.64* 1.49*  --  1.31*  CALCIUM   --  8.9 8.7*  --  8.6*  MG  --   --   --  1.7 1.6*    GFR: Estimated Creatinine Clearance: 40 mL/min (A) (by C-G formula based on SCr of 1.31 mg/dL (H)).  Liver Function Tests: Recent Labs  Lab 04/17/23 2330  AST 22  ALT 14  ALKPHOS 75  BILITOT 0.4  PROT 8.8*  ALBUMIN 3.1*    CBG: Recent Labs  Lab 04/18/23 0733  GLUCAP 152*     Recent Results (from the past 240 hours)  Urine Culture     Status: Abnormal (Preliminary result)   Collection Time: 04/18/23  3:23 AM   Specimen: In/Out Cath Urine  Result Value Ref Range Status   Specimen Description IN/OUT CATH URINE  Final   Special Requests   Final    NONE Performed at [facility], [address], [place], [place] [address]    Culture >=100,000 COLONIES/mL GRAM NEGATIVE RODS (A)  Final   Report Status PENDING  Incomplete         Radiology Studies: MR BRAIN WO CONTRAST Result Date: 04/18/2023 CLINICAL DATA:  Neuro deficit, acute, stroke suspected. Left facial droop. Dysarthria. Past medical history of acute metabolic encephalopathy. EXAM: MRI HEAD WITHOUT CONTRAST TECHNIQUE: Multiplanar, multiecho pulse sequences of the brain and surrounding structures were obtained without intravenous contrast. COMPARISON:  CT head without contrast 04/17/2023. MR head without contrast 01/17/2023 FINDINGS: Brain: Advanced atrophy and diffuse white matter disease is again noted. No acute infarct or hemorrhage is present. No mass lesion is present. Dilated perivascular spaces are  present within the basal ganglia bilaterally. Remote lacunar infarcts are present in the cerebellum bilaterally. Marked thinning of the corpus callosum is present. The ventricles are proportionate to the degree of atrophy. No significant extraaxial fluid collection is present. The internal auditory canals are within normal limits. Vascular: Flow is present in the major intracranial arteries. Skull and upper cervical spine: The craniocervical junction is normal. Upper cervical spine is within normal limits. Marrow signal is unremarkable. Sinuses/Orbits: A fluid level is present the right maxillary sinus. Mucosal thickening is present in the ethmoid air cells bilaterally. The paranasal sinuses and mastoid air cells are otherwise clear. A right lens replacement is present. Globes and orbits are within normal limits bilaterally. IMPRESSION: 1. No acute intracranial abnormality or significant interval change. 2. Advanced atrophy and diffuse white  matter disease likely reflects the sequela of chronic microvascular ischemia. 3. Remote lacunar infarcts of the cerebellum bilaterally. 4. Fluid level in the right maxillary sinus may reflect acute sinusitis. Electronically Signed   By: Lonni Necessary M.D.   On: 04/18/2023 14:06   CT HEAD CODE STROKE WO CONTRAST Result Date: 04/17/2023 CLINICAL DATA:  Code stroke. Initial evaluation for acute neuro deficit, left facial droop. EXAM: CT HEAD WITHOUT CONTRAST TECHNIQUE: Contiguous axial images were obtained from the base of the skull through the vertex without intravenous contrast. RADIATION DOSE REDUCTION: This exam was performed according to the departmental dose-optimization program which includes automated exposure control, adjustment of the mA and/or kV according to patient size and/or use of iterative reconstruction technique. COMPARISON:  MRI from 01/17/2023. FINDINGS: Brain: Advanced age-related cerebral atrophy with chronic small vessel ischemic disease. Few  scattered remote cerebellar infarcts noted. No acute intracranial hemorrhage. No acute large vessel territory infarct. No mass lesion or midline shift. Mild ventricular prominence related global parenchymal volume loss of hydrocephalus. No extra-axial fluid collection. Vascular: No abnormal hyperdense vessel. Calcified atherosclerosis present about the skull base. Skull: Scalp soft tissues within normal limits.  Calvarium intact. Sinuses/Orbits: Globes orbital soft tissues demonstrate no acute finding. Ocular senescent calcifications noted. Mild to moderate mucosal thickening present about the sphenoethmoidal and maxillary sinuses. Few superimposed air-fluid levels noted. Mastoid air cells are clear. Other: None. ASPECTS [facility] Stroke Program Early CT Score) - Ganglionic level infarction (caudate, lentiform nuclei, internal capsule, insula, M1-M3 cortex): 7 - Supraganglionic infarction (M4-M6 cortex): 3 Total score (0-10 with 10 being normal): 10 IMPRESSION: 1. No acute intracranial abnormality. 2. ASPECTS is 10. 3. Advanced age-related cerebral atrophy with chronic small vessel ischemic disease. Few scatter remote cerebellar infarcts. 4. Mucosal thickening with a few scattered air-fluid levels within the paranasal sinuses. Clinical correlation for possible acute sinusitis recommended. These results were communicated to Dr. Jerrie at 11:03 pm on 04/17/2023 by text page via the [facility] messaging system. Electronically Signed   By: Morene Hoard M.D.   On: 04/17/2023 23:04        Scheduled Meds:  aspirin   81 mg Oral Daily   atorvastatin   40 mg Oral QHS   divalproex   125 mg Oral BID   docusate sodium   100 mg Oral BID   ferrous gluconate   324 mg Oral Daily   fluticasone  furoate-vilanterol  1 puff Inhalation Daily   gabapentin   100 mg Oral BID   insulin  glargine-yfgn  24 Units Subcutaneous QHS   lipase/protease/amylase  24,000 Units Oral TID WC   loratadine   10 mg Oral Daily   metoprolol  tartrate   12.5 mg Oral BID   pantoprazole   40 mg Oral Daily   sertraline   25 mg Oral Daily   sodium chloride  flush  3 mL Intravenous Q12H   Continuous Infusions:  cefTRIAXone  (ROCEPHIN )  IV 1 g (04/19/23 0927)     LOS: 0 days       Brayton Lye, MD [facility]   To contact the attending provider between 7A-7P or the covering provider during after hours 7P-7A, please log into the web site [email or web address] and access using universal [place] password for that web site. If you do not have the password, please call the hospital operator.  04/19/2023, 10:02 AM

## 2023-04-19 NOTE — Consult Note (Addendum)
 Hospital Consult    Reason for Consult:  PVD and left great toe gangrene Requesting Physician:  Dr. Sherlon MRN #:  [id number]  History of Present Illness: This is a 89 y.o. male with medical history including HTN, HLD Type II DM, hx of Stroke with residual right hemiparesis, dementia, PVD with hx of right TMA who is wheel chair dependent, who presented from SNF due to AMS. Vascular surgery was consulted for evaluation of noted left great toe gangrene. He is very well known to [facility] having undergone several endovascular interventions in the past on the RLE. He also has been under the management of Dr. Tobie with Podiatry for wound management. His most recent intervention was on 11/24/21 with [facility] angioplasty of his popliteal, TPT and peroneal arteries. Following this intervention is when he underwent his right TMA by podiatry for osteomyelitis of the right 2nd and 3rd metatarsal heads. He was last seen in our office on 03/05/23 and at that time had some dry stable ulceration on the left 3rd toe. Had a palpable DP and biphasic signals by doppler. No intervention was indicated. Local wound care and offloading of the foot was recommended.  Pt reports no pain in his feet or toes. He does not ambulated as stated.  Non invasive arterial studies have been ordered but are pending completion.   Past Medical History:  Diagnosis Date   Acute metabolic encephalopathy 05/24/2021   Arthritis    Diabetes mellitus    HTN (hypertension)    Hypercholesteremia    Iron  deficiency    Pancreatitis, acute 06/02/2012   Diagnosed 05/2012    Personal history of colonic adenomas 04/15/2013   04/15/2013 2 diminutive polyps     Stroke [facility])    TIA (transient ischemic attack)     Past Surgical History:  Procedure Laterality Date   ABDOMINAL AORTOGRAM W/LOWER EXTREMITY Right 11/24/2021   Procedure: ABDOMINAL AORTOGRAM W/LOWER EXTREMITY;  Surgeon: Magda Debby SAILOR, MD;  Location: [facility] INVASIVE CV LAB;  Service: Cardiovascular;   Laterality: Right;   AMPUTATION TOE Right 11/26/2021   Procedure: AMPUTATION TOE;  Surgeon: Gershon Donnice SAUNDERS, DPM;  Location: [facility] OR;  Service: Podiatry;  Laterality: Right;   ARTERY BIOPSY  02/26/2011   Procedure: BIOPSY TEMPORAL ARTERY;  Surgeon: Elsie JINNY Signs, MD;  Location: [facility];  Service: General;  Laterality: Left;  Left temporal artery biospy   BACK SURGERY  1985   CHOLECYSTECTOMY     COLONOSCOPY     ESOPHAGOGASTRODUODENOSCOPY N/A 12/06/2022   Procedure: ESOPHAGOGASTRODUODENOSCOPY (EGD);  Surgeon: San Sandor GAILS, DO;  Location: [facility] ENDOSCOPY;  Service: Gastroenterology;  Laterality: N/A;   EUS     HEMOSTASIS CLIP PLACEMENT  12/06/2022   Procedure: HEMOSTASIS CLIP PLACEMENT;  Surgeon: San Sandor GAILS, DO;  Location: [facility] ENDOSCOPY;  Service: Gastroenterology;;   HOT HEMOSTASIS N/A 12/06/2022   Procedure: HOT HEMOSTASIS (ARGON PLASMA COAGULATION/BICAP);  Surgeon: San Sandor GAILS, DO;  Location: [facility] ENDOSCOPY;  Service: Gastroenterology;  Laterality: N/A;   NECK SURGERY  1985   PERIPHERAL VASCULAR BALLOON ANGIOPLASTY Right 11/24/2021   Procedure: PERIPHERAL VASCULAR BALLOON ANGIOPLASTY;  Surgeon: Magda Debby SAILOR, MD;  Location: [facility] INV[facility] CV LAB;  Service: Cardiovascular;  Laterality: Right;  POPLITEAL AND PERONEAL   TEMPORAL ARTERY BIOPSY / LIGATION  02/26/2011   left side   TRANSMETATARSAL AMPUTATION Right 01/28/2022   Procedure: TRANSMETATARSAL AMPUTATION; ACHILLES TENDON LENGTHENING;  Surgeon: Tobie Franky SQUIBB, DPM;  Location: [facility] ORS;  Service: Podiatry;  Laterality: Right;  R  foot Transmetatarsal amputation, possible tendo achilles lengthening   UPPER GASTROINTESTINAL ENDOSCOPY      No Known Allergies  Prior to Admission medications   Medication Sig Start Date End Date Taking? Authorizing Provider  acetaminophen  (TYLENOL ) 325 MG tablet Take 650 mg by mouth daily.   Yes [provider]  aspirin  81 MG chewable tablet Chew 81 mg by mouth  daily.   Yes [provider]  atorvastatin  (LIPITOR ) 40 MG tablet Take 40 mg by mouth at bedtime. 11/03/21  Yes [provider]  BREO ELLIPTA  100-25 MCG/ACT AEPB Inhale 1 puff into the lungs daily. 09/14/22  Yes [provider]  divalproex  (DEPAKOTE  SPRINKLE) 125 MG capsule Take 125 mg by mouth 2 (two) times daily. 10/27/22  Yes [provider]  docusate sodium  (COLACE) 100 MG capsule Take 100 mg by mouth 2 (two) times daily.   Yes [provider]  Dulaglutide 1.5 MG/0.5ML SOPN Inject 1.5 mg into the skin once a week. tuesday   Yes [provider]  ferrous gluconate  (FERGON) 324 MG tablet Take 324 mg by mouth daily.   Yes [provider]  gabapentin  (NEURONTIN ) 100 MG capsule Take 100 mg by mouth 2 (two) times daily. 10/29/22  Yes [provider]  glucagon  (GLUCAGON  EMERGENCY) 1 MG injection Inject 1 mg into the muscle once as needed. Patient taking differently: Inject 1 mg into the muscle once as needed (for low blood sugar). 09/29/15  Yes Trixie File, MD  hydrOXYzine  (ATARAX ) 10 MG tablet Take 10 mg by mouth 2 (two) times daily. hold for sedation   Yes [provider]  insulin  glargine, 2 Unit Dial, (TOUJEO  MAX SOLOSTAR) 300 UNIT/ML Solostar Pen Inject 24 Units into the skin at bedtime.   Yes [provider]  insulin  lispro (HUMALOG ) 100 UNIT/ML injection Inject 0-12 Units into the skin 3 (three) times daily before meals. BS 70-200 give 0 units  BS 201-250 give 2 units  BS 251-300 give 4 units  BS 301-350 give 6 units  BS 351-400 give 8 units  BS 401-450 give 10 units  BS 451-600 give 12 units   Yes [provider]  Ipratropium-Albuterol  (COMBIVENT) 20-100 MCG/ACT AERS respimat Inhale 1 puff into the lungs every 6 (six) hours as needed for wheezing. Patient taking differently: Inhale 1 puff into the lungs every 6 (six) hours as needed (for COPD). 05/25/21  Yes Bryn Bernardino NOVAK, MD  isosorbide   mononitrate (ISMO ) 10 MG tablet Take 10 mg by mouth at bedtime.   Yes [provider]  loratadine  (CLARITIN ) 10 MG tablet Take 10 mg by mouth daily.   Yes [provider]  oxyCODONE  (OXY IR/ROXICODONE ) 5 MG immediate release tablet Take 5 mg by mouth every 8 (eight) hours as needed for severe pain (pain score 7-10).   Yes [provider]  oxymetazoline  (AFRIN) 0.05 % nasal spray Place 2 sprays into both nostrils 2 (two) times daily as needed for congestion.   Yes [provider]  Pancrelipase , Lip-Prot-Amyl, (CREON ) 24000-76000 units CPEP Take 1 capsule by mouth 3 (three) times daily. With 4-6 ounces of water   Yes [provider]  pantoprazole  (PROTONIX ) 40 MG tablet Take 1 tablet (40 mg total) by mouth daily. 12/08/22 12/08/23 Yes Gherghe, Costin M, MD  Pollen Extracts (PROSTAT PO) Take 30 mLs by mouth 3 (three) times daily. 17-100g-kcal/30 ml   Yes [provider]  sertraline  (ZOLOFT ) 25 MG tablet Take 25 mg by mouth daily.   Yes  [provider]  Skin Protectants, Misc. (MINERIN CREME EX) Apply 1 Application topically at bedtime. Both legs and feet.   Yes [provider]  Sodium Chloride  (NASAL MIST) 0.9 % AERS Place 2 sprays into the nose in the morning and at bedtime.   Yes [provider]  doxycycline  (VIBRA -TABS) 100 MG tablet Take 1 tablet (100 mg total) by mouth 2 (two) times daily. Patient not taking: Reported on 04/18/2023 02/06/23   Tobie Franky SQUIBB, DPM  Spacer/Aero-Holding Raguel FRENCH Use as directed with combivent. Ok to dispense any spacer that works with the inhaler being rxed to pt. 09/20/15   Loreli Elyn SAILOR, MD    Social History   Socioeconomic History   Marital status: Married    Spouse name: Not on file   Number of children: 6   Years of education: Not on file   Highest education level: Not on file  Occupational History   Not on file  Tobacco Use   Smoking status: Former    Current packs/day: 0.00     Types: Cigarettes    Start date: 04/10/1955    Quit date: 04/09/1990    Years since quitting: 33.0   Smokeless tobacco: Never  Substance and Sexual Activity   Alcohol  use: No    Comment: per pt stopped drinking in the 90's   Drug use: No   Sexual activity: Not on file  Other Topics Concern   Not on file  Social History Narrative   Not on file   Social Drivers of Health   Financial Resource Strain: Not on file  Food Insecurity: No Food Insecurity (12/05/2022)   Hunger Vital Sign    Worried About Running Out of Food in the Last Year: Never true    Ran Out of Food in the Last Year: Never true  Transportation Needs: No Transportation Needs (12/05/2022)   PRAPARE - Administrator, Civil Service (Medical): No    Lack of Transportation (Non-Medical): No  Physical Activity: Not on file  Stress: Not on file  Social Connections: Not on file  Intimate Partner Violence: Not At Risk (12/05/2022)   Humiliation, Afraid, Rape, and Kick questionnaire    Fear of Current or Ex-Partner: No    Emotionally Abused: No    Physically Abused: No    Sexually Abused: No     Family History  Problem Relation Age of Onset   Heart disease Mother    Amblyopia Father    Diabetes Sister        x 2   Heart attack Sister     ROS: Otherwise negative unless mentioned in HPI  Physical Examination  Vitals:   04/19/23 0700 04/19/23 0800  BP: (!) 147/70 (!) 151/81  Pulse: 78 86  Resp: 16 12  Temp:  98.4 F (36.9 C)  SpO2: 100% 100%   Body mass index is 19.76 kg/m.  General:  WDWN in NAD Gait: Not observed HENT: WNL, normocephalic Pulmonary: normal non-labored breathing Cardiac: regular Abdomen:  soft Vascular Exam/Pulses: 2 + femoral pulses bilaterally, no palpable distal pulses, monophasic doppler Dp/PT Extremities: without ischemic changes, with Gangrene of distal left great toe , without cellulitis; without open wounds; small dry ulceration on the medial left 3rd  toe  Musculoskeletal: no muscle wasting or atrophy  Neurologic: A&O X 3;  No focal weakness or paresthesias are detected; speech is fluent/normal Psychiatric:  The pt has Normal affect.  CBC    Component Value Date/Time  WBC 9.4 04/19/2023 0525   RBC 3.65 (L) 04/19/2023 0525   HGB 11.0 (L) 04/19/2023 0525   HCT 33.4 (L) 04/19/2023 0525   PLT 252 04/19/2023 0525   MCV 91.5 04/19/2023 0525   MCV 89.5 12/31/2013 2117   MCH 30.1 04/19/2023 0525   MCHC 32.9 04/19/2023 0525   RDW 16.3 (H) 04/19/2023 0525   LYMPHSABS 3.5 04/17/2023 2330   MONOABS 0.9 04/17/2023 2330   EOSABS 0.6 (H) 04/17/2023 2330   BASOSABS 0.1 04/17/2023 2330    BMET    Component Value Date/Time   NA 136 04/19/2023 0525   K 3.6 04/19/2023 0525   CL 104 04/19/2023 0525   CO2 21 (L) 04/19/2023 0525   GLUCOSE 139 (H) 04/19/2023 0525   BUN 19 04/19/2023 0525   CREATININE 1.31 (H) 04/19/2023 0525   CREATININE 2.01 (H) 12/20/2021 1434   CALCIUM  8.6 (L) 04/19/2023 0525   GFRNONAA 53 (L) 04/19/2023 0525   GFRNONAA 79 09/15/2015 1117   GFRAA 56 (L) 08/31/2018 2017   GFRAA >89 09/15/2015 1117    COAGS: Lab Results  Component Value Date   INR 1.1 04/17/2023   INR 1.0 12/04/2022   INR 1.2 01/28/2022     Non-Invasive Vascular Imaging:   ABI and arterial duplex pending  Statin:  Yes.   Beta Blocker:  Yes.   Aspirin :  Yes.   ACEI:  No. ARB:  No. CCB use:  No Other antiplatelets/anticoagulants:  No.    ASSESSMENT/PLAN: This is a 89 y.o. male with medical history including HTN, HLD Type II DM, hx of Stroke with residual right hemiparesis, dementia, PVD with hx of right TMA who is wheel chair dependent, who presented from SNF due to AMS. Noted to have gangrene of left great toe and ulceration of left 3rd toe. Vascular surgery was consulted for evaluation. We are very familiar with Mr. Cumpston as he has undergone prior arterial interventions with us  in the past. He does have monophasic signals bilaterally  in the Dp and PT. Non invasive arterial studies were ordered but have not yet been completed. He is not a candidate for any surgical intervention given that he does not ambulate. He has known severe tibial disease from prior intervention on the RLE. I discussed with patient recommendation for Angiogram today in the cath lab and he is agreeable to proceed. With his underlying dementia I wanted to get consent from his daughter. I was able to catch patients daughter in the hallway and discussed recommendation for Angiogram and she is agreeable to proceed. Risks/ benefits/ alternatives were discussed.  - Patient has not yet eaten this morning so will get him on the schedule for today in the cath lab with Dr. Magda - Please keep NPO - Consent order placed - Plan is for Aortogram, Arteriogram of BLE with possible intervention on the left lower extremity   Corrina Baglia PA-C [facility] [phone] 04/19/2023  8:38 AM  VASCULAR STAFF ADDENDUM: I have independently interviewed and examined the patient. I agree with the above.   Debby SAILOR. Magda, MD [facility] Phone Number: [phone] 04/19/2023 10:47 AM

## 2023-04-19 NOTE — Progress Notes (Signed)
Act 187

## 2023-04-19 NOTE — Plan of Care (Signed)
   Problem: Education: Goal: Knowledge of General Education information will improve Description Including pain rating scale, medication(s)/side effects and non-pharmacologic comfort measures Outcome: Progressing   Problem: Health Behavior/Discharge Planning: Goal: Ability to manage health-related needs will improve Outcome: Progressing

## 2023-04-19 NOTE — Progress Notes (Signed)
Act 193

## 2023-04-19 NOTE — Progress Notes (Addendum)
 Initial Nutrition Assessment  DOCUMENTATION CODES:   Not applicable  INTERVENTION:   MVI with minerals daily. Vitamin C  500 mg daily to support wound healing. Zinc  sulfate 220 mg daily x 14 days to support wound healing. Ensure Enlive po TID, each supplement provides 350 kcal and 20 grams of protein. Diet clarification: dysphagia 3 with thin liquids per SLP recommendation on 1/9. NPO at midnight for procedure tomorrow.   NUTRITION DIAGNOSIS:   Increased nutrient needs related to wound healing as evidenced by estimated needs.  GOAL:   Patient will meet greater than or equal to 90% of their needs  MONITOR:   PO intake, Supplement acceptance, Skin  REASON FOR ASSESSMENT:   Consult Assessment of nutrition requirement/status  ASSESSMENT:   89 yo male admitted with AMS, L toe gangrene. PMH includes HTN, HLD, DM-2, stroke w/ residual right hemiparesis, dementia, PVD, R TMA, wheel chair dependent.   S/P bedside swallow evaluation with SLP 1/9; diet advanced to dysphagia 3 with thin liquids; meal intakes not recorded. NPO today for angiogram.   Patient currently in the cath lab. Unable to speak with him or complete NFPE at this time.   Plans for L great toe amputation in the near future.   Labs reviewed. Mag 1.6  Medications reviewed and include colace, fergon, novolog , semglee , creon , protonix , IV mag sulfate, IV KCl. IVF: LR at 75 ml/h.   Weight history reviewed. No significant weight changes noted recently.   Palliative care team following. Per MOST form, patient does not want a feeding tube.   NUTRITION - FOCUSED PHYSICAL EXAM:  Unable to complete, patient in cath lab  Diet Order:   Diet Order             Diet NPO time specified  Diet effective midnight           Diet Carb Modified Fluid consistency: Thin; Room service appropriate? Yes  Diet effective now                   EDUCATION NEEDS:   No education needs have been identified at this  time  Skin:  Skin Assessment: Skin Integrity Issues: Skin Integrity Issues:: Other (Comment) Other: L great toe ischemia; L third toe wound  Last BM:  1/9  Height:   Ht Readings from Last 1 Encounters:  04/18/23 6' 2 (1.88 m)    Weight:   Wt Readings from Last 1 Encounters:  04/18/23 69.8 kg    Ideal Body Weight:  86 kg  BMI:  Body mass index is 19.76 kg/m.  Estimated Nutritional Needs:   Kcal:  2100-2300  Protein:  110-130 gm  Fluid:  2.1-2.3 L   Suzen HUNT RD, LDN, CNSC Contact Inpatient RD using Secure Chat. If unavailable, use group chat RD Inpatient via Secure Chat in EPIC.

## 2023-04-19 NOTE — Op Note (Signed)
 DATE OF SERVICE: 04/19/2023  PATIENT:  Justin [name]  89 y.o. male  PRE-OPERATIVE DIAGNOSIS:  Atherosclerosis of native arteries of left lower extremity causing gangrene  POST-OPERATIVE DIAGNOSIS:  Same  PROCEDURE:   1) Ultrasound guided right common femoral artery access 2) Abdominal aortogram 3) Left lower extremity angiogram with second order cannulation  4) Additional left lower extremity angiogram with third order cannulation 5) Left anterior tibial angioplasty (3x180mm Sterling)  CPT: 23062, 24374, 63753, 63752, 62771,   SURGEON:  Debby SAILOR. Magda, MD  ASSISTANT: none  ANESTHESIA:   local  ESTIMATED BLOOD LOSS: minimal  LOCAL MEDICATIONS USED:  LIDOCAINE    COUNTS: confirmed correct.  PATIENT DISPOSITION:  PACU - hemodynamically stable.   Delay start of Pharmacological VTE agent (>24hrs) due to surgical blood loss or risk of bleeding: no  INDICATION FOR PROCEDURE: Justin [name] is a 89 y.o. male with left foot gangrene in setting of peripheral arterial disease. After careful discussion of risks, benefits, and alternatives the patient was offered angiogram. The patient understood and wished to proceed.  OPERATIVE FINDINGS:   Left renal artery: not seen Right renal artery: patent  Infrarenal aorta: patent  Left common iliac artery: patent Right common iliac artery: patent  Left internal iliac artery: patent Right internal iliac artery: patent  Left external iliac artery: patent Right external iliac artery: patent  Left common femoral artery: patent Right common femoral artery: not studied  Left profunda femoris artery: patent Right profunda femoris artery: not studied  Left superficial femoral artery: patent Right superficial femoral artery: not studied  Left popliteal artery: patent Right popliteal artery: not studied  Left anterior tibial artery: severe stenosis in proximal 2/3 of vessel; fills the foot via DP Right anterior tibial artery: not studied  Left  tibioperoneal trunk: occluded Right tibioperoneal trunk: not studied  Left peroneal artery: occluded Right peroneal artery: not studied  Left posterior tibial artery: occluded Right posterior tibial artery: not studied  Left pedal circulation: disadvantaged Right pedal circulation: not studied   GLASS score. FP 0. IP 3. Stage II  WIfI score. 2 / 3 / 0. Stage IV.  DESCRIPTION OF PROCEDURE: After identification of the patient in the pre-operative holding area, the patient was transferred to the operating room. The patient was positioned supine on the operating room table. Anesthesia was induced. The groins was prepped and draped in standard fashion. A surgical pause was performed confirming correct patient, procedure, and operative location.  The right groin was anesthetized with subcutaneous injection of 1% lidocaine . Using ultrasound guidance, the right common femoral artery was accessed with micropuncture technique. Fluoroscopy was used to confirm cannulation over the femoral head. The 70F micropuncture sheath was upsized to 63F.   A Benson wire was advanced into the distal aorta. Over the wire an omni flush catheter was advanced to the level of L2. Aortogram was performed - see above for details.   The left common iliac artery was selected with an omniflush catheter and glidewire guidewire. The wire was advanced into the common femoral artery. Over the wire the omni flush catheter was advanced into the external iliac artery. Selective angiography was performed - see above for details.   The decision was made to intervene. The patient was heparinized with 7,000 units of heparin . The 63F sheath was exchanged for a 63F x 65cm sheath. Selective angiography of the left lower extremity with a catheter in the popliteal artery was performed prior to intervention.   The lesions were treated with:  Left anterior tibial angioplasty (3x119mm Sterling)  Completion angiography revealed:  Resolution of anterior  tibial stenosis  The sheath was left in place to be removed in the recovery area.  Upon completion of the case instrument and sharps counts were confirmed correct. The patient was transferred to the PACU in good condition. I was present for all portions of the procedure.  PLAN: ASA / Plavix  / Statin. Pedal flow disadvantaged, so may not heal a toe amputation despite revascularization. He is optimized from a vascular standpoint.  Debby SAILOR. Magda, MD [facility] Phone Number: [phone] 04/19/2023 11:36 AM

## 2023-04-19 NOTE — Progress Notes (Signed)
 OT Cancellation Note  Patient Details Name: Justin [name] MRN: [id number] DOB: Jun 17, 1934   Cancelled Treatment:    Reason Eval/Treat Not Completed: Patient not medically ready (Pt transferred to new unit,s/p sheath removal and on bed rest. OT will follow-up with pt as able.)  04/19/2023  AB, OTR/L  Acute Rehabilitation Services  Office: [phone]   Justin [name] 04/19/2023, 3:53 PM

## 2023-04-19 NOTE — Care Management Obs Status (Signed)
 MEDICARE OBSERVATION STATUS NOTIFICATION   Patient Details  Name: Justin [name] MRN: [id number] Date of Birth: Jul 17, 1934   Medicare Observation Status Notification Given:       Gordy Clement, RN 04/19/2023, 10:22 AM

## 2023-04-19 NOTE — Progress Notes (Signed)
 OT Cancellation Note  Patient Details Name: Justin [name] MRN: [id number] DOB: Feb 13, 1935   Cancelled Treatment:    Reason Eval/Treat Not Completed: Patient at procedure or test/ unavailable (Pt off unit at this time, OT will follow-up with pt as able.)  04/19/2023  AB, OTR/L  Acute Rehabilitation Services  Office: [phone]   [name] 04/19/2023, 12:42 PM

## 2023-04-19 NOTE — Progress Notes (Signed)
 Site area: R Groin (5fr) Site Prior to Removal:  Level 0 Pressure Applied For: 25 min Manual:  Yes  Patient Status During Pull:  Stable Post Pull Site:  Level 0 Post Pull Instructions Given:  Yes Post Pull Pulses Present: +1 DP R L Dressing Applied:  Gauze & Tegaderm Bedrest begins @ 1425 Comments: Per Magda, MD OK to pull sheathe with current ACT.  During manual pressure hold, bladder felt distended.  Lauren, RN performed bladder scan. noted.  No intervention needed.  Pt reports feeling well, NAD.  Will continue to monitor.

## 2023-04-19 NOTE — Progress Notes (Signed)
 CSW received consult for possible SNF placement at time of discharge.  Due to patients current orientation CSW LVM for patients daughter [name]. CSW awaiting call back to discuss Pt recommendations. CSW will continue to follow.

## 2023-04-19 NOTE — Consult Note (Signed)
 PODIATRY CONSULTATION  NAME Justin [name] MRN [id number] DOB Feb 02, 1935 DOA 04/17/2023   Reason for consult: Gangrene of left hallux  Attending/Consulting physician: CHARM Lye MD  History of present illness: 89 year old male with past medical history of hypertension, hyperlipidemia, type 2 diabetes mellitus, anemia, chronic pancreatitis, history of CVA with residual right-sided hemiparesis, GI bleed, vascular and Alzheimer's dementia, status post left TMA, known peripheral vascular disease, who is SNF resident for last 4 years, wheelchair dependent, patient was sent from facility given altered mental status, and concern for facial droop, patient is more awake and conversant upon my evaluation, but it does appear he is with advanced dementia, daughter and son at bedside assist with the history, in ED MRI brain rule out acute CVA, he was noted to have right great toe dry gangrene wound  I saw patient in the Cath Lab PACU area.  He is hard of hearing.  I explained he has bone infection in his left great toe.  Explained I recommend amputation of the left great toe.  Explained that hopefully will heal however he does have disadvantaged arterial flow to the left foot and if it does not heal he may need further amputation.  He is understanding of this and wishes to proceed.  I attempted multiple times to reach out to contacts in the chart however I did not get any answer on the phone.  Past Medical History:  Diagnosis Date   Acute metabolic encephalopathy 05/24/2021   Arthritis    Diabetes mellitus    HTN (hypertension)    Hypercholesteremia    Iron  deficiency    Pancreatitis, acute 06/02/2012   Diagnosed 05/2012    Personal history of colonic adenomas 04/15/2013   04/15/2013 2 diminutive polyps     Stroke [facility])    TIA (transient ischemic attack)        Latest Ref Rng & Units 04/19/2023    5:25 AM 04/18/2023    7:38 AM 04/17/2023   11:30 PM  CBC  WBC 4.0 - 10.5 K/uL 9.4  9.6  9.5    Hemoglobin 13.0 - 17.0 g/dL 88.9  89.3  88.9   Hematocrit 39.0 - 52.0 % 33.4  33.0  33.9   Platelets 150 - 400 K/uL 252  243  244        Latest Ref Rng & Units 04/19/2023    5:25 AM 04/18/2023    7:38 AM 04/17/2023   11:30 PM  BMP  Glucose 70 - 99 mg/dL 860  837  796   BUN 8 - 23 mg/dL 19  25  27    Creatinine 0.61 - 1.24 mg/dL 8.68  8.50  8.35   Sodium 135 - 145 mmol/L 136  136  136   Potassium 3.5 - 5.1 mmol/L 3.6  4.1  4.0   Chloride 98 - 111 mmol/L 104  106  106   CO2 22 - 32 mmol/L 21  21  22    Calcium  8.9 - 10.3 mg/dL 8.6  8.7  8.9       Physical Exam: Lower Extremity Exam Vasc: R - PT 1/4 palpable, DP 1/4palpable.   L - PT 1/4 palpable, DP 1/4 palpable. Cap refill absent to hallux  Derm: R -TMA without open wound  L -necrosis of the distal tuft of the left hallux.  Eschar in place.  No active drainage.  Mild erythema and edema of the toe.  MSK:  R - prior TMA appears well-healed  L -  No  gross deformities. Compartments soft, non-tender, compressible  Neuro: R - Gross sensation diminished. Gross motor function intact   L - Gross sensation diminished. Gross motor function intact    ASSESSMENT/PLAN OF CARE 89 y.o. male with PMHx significant for DM2 with PVD and neuropathy HTN HLD CKD history of CVA dementia with left hallux osteomyelitis and gangrenous changes  XR L foot : pending MRI L foot: Pending  - Pending MRI L foot, will plan for possible left hallux amputation tomorrow AM.  Called MRI to discuss this with them and they stated the original attempt was aborted due to the patient unable to hold still -If MRI not completed surgery will be pushed to Monday at the earliest. -Appreciate vascular surgery patient taken for angiogram left lower extremity today, per op note pedal flow is disadvantaged and may not heal toe amputation despite revascularization but is optimized from their standpoint. - Continue IV abx broad spectrum pending further culture data -  Anticoagulation: Okay to continue per vascular recommendations - Wound care: Betadine paint to the left hallux preop - WB status: Patient is nonambulatory at baseline - Will continue to follow   Thank you for the consult.  Please contact me directly with any questions or concerns.           Marolyn JULIANNA Honour, DPM [facility]    [address] [place], [place] [address]                Office [phone]  Fax [phone]

## 2023-04-20 ENCOUNTER — Encounter (HOSPITAL_COMMUNITY): Payer: Self-pay | Admitting: Anesthesiology

## 2023-04-20 ENCOUNTER — Encounter (HOSPITAL_COMMUNITY)
Admission: EM | Disposition: A | Discharge status: Skilled Nursing Facility | Payer: Self-pay | Source: Skilled Nursing Facility | Attending: Internal Medicine

## 2023-04-20 ENCOUNTER — Inpatient Hospital Stay (HOSPITAL_COMMUNITY): Payer: Medicare Other

## 2023-04-20 DIAGNOSIS — I70262 Atherosclerosis of native arteries of extremities with gangrene, left leg: Secondary | ICD-10-CM | POA: Diagnosis not present

## 2023-04-20 DIAGNOSIS — G9341 Metabolic encephalopathy: Secondary | ICD-10-CM | POA: Diagnosis not present

## 2023-04-20 LAB — URINE CULTURE: Culture: >=100000 — AB

## 2023-04-20 LAB — CBC
HCT: 27.6 % — ABNORMAL LOW (ref 39.0–52.0)
Hemoglobin: 9.3 g/dL — ABNORMAL LOW (ref 13.0–17.0)
MCH: 30.8 pg (ref 26.0–34.0)
MCHC: 33.7 g/dL (ref 30.0–36.0)
MCV: 91.4 fL (ref 80.0–100.0)
Platelets: 224 10*3/uL (ref 150–400)
RBC: 3.02 MIL/uL — ABNORMAL LOW (ref 4.22–5.81)
RDW: 16.5 % — ABNORMAL HIGH (ref 11.5–15.5)
WBC: 14.1 10*3/uL — ABNORMAL HIGH (ref 4.0–10.5)
nRBC: 0 % (ref 0.0–0.2)

## 2023-04-20 LAB — GLUCOSE, CAPILLARY
Glucose-Capillary: 111 mg/dL — ABNORMAL HIGH (ref 70–99)
Glucose-Capillary: 115 mg/dL — ABNORMAL HIGH (ref 70–99)
Glucose-Capillary: 95 mg/dL (ref 70–99)
Glucose-Capillary: 134 mg/dL — ABNORMAL HIGH (ref 70–99)
Glucose-Capillary: 125 mg/dL — ABNORMAL HIGH (ref 70–99)

## 2023-04-20 LAB — BASIC METABOLIC PANEL
Anion gap: 10 (ref 5–15)
BUN: 25 mg/dL — ABNORMAL HIGH (ref 8–23)
CO2: 21 mmol/L — ABNORMAL LOW (ref 22–32)
Calcium: 8.2 mg/dL — ABNORMAL LOW (ref 8.9–10.3)
Chloride: 106 mmol/L (ref 98–111)
Creatinine, Ser: 1.83 mg/dL — ABNORMAL HIGH (ref 0.61–1.24)
GFR, Estimated: 35 mL/min — ABNORMAL LOW (ref >60)
Glucose, Bld: 171 mg/dL — ABNORMAL HIGH (ref 70–99)
Potassium: 3.7 mmol/L (ref 3.5–5.1)
Sodium: 137 mmol/L (ref 135–145)

## 2023-04-20 LAB — MAGNESIUM: Magnesium: 1.7 mg/dL (ref 1.7–2.4)

## 2023-04-20 LAB — PHOSPHORUS: Phosphorus: 3.4 mg/dL (ref 2.5–4.6)

## 2023-04-20 SURGERY — AMPUTATION TOE
Anesthesia: Choice | Site: Toe | Laterality: Left

## 2023-04-20 MED ORDER — ENOXAPARIN SODIUM 30 MG/0.3ML IJ SOSY
30.0000 mg | PREFILLED_SYRINGE | INTRAMUSCULAR | Status: DC
  Administered 2023-04-21 (×2): 30 mg via SUBCUTANEOUS
  Filled 2023-04-20 (×2): qty 0.3

## 2023-04-20 MED ORDER — HALOPERIDOL LACTATE 5 MG/ML IJ SOLN
1.0000 mg | Freq: Four times a day (QID) | INTRAMUSCULAR | Status: DC | PRN
  Filled 2023-04-20: qty 1

## 2023-04-20 MED ORDER — LINEZOLID 600 MG/300ML IV SOLN
600.0000 mg | Freq: Two times a day (BID) | INTRAVENOUS | Status: DC
Start: 2023-04-20 — End: 2023-04-23
  Administered 2023-04-20 – 2023-04-22 (×6): 600 mg via INTRAVENOUS
  Filled 2023-04-20 (×7): qty 300

## 2023-04-20 MED ORDER — MAGNESIUM SULFATE 2 GM/50ML IV SOLN
INTRAVENOUS | Status: AC
  Filled 2023-04-20: qty 50

## 2023-04-20 MED ORDER — LACTATED RINGERS IV SOLN
INTRAVENOUS | Status: AC
Start: 2023-04-20 — End: 2023-04-21

## 2023-04-20 MED ORDER — ONDANSETRON HCL 4 MG/2ML IJ SOLN: 4.0000 mg | Freq: Four times a day (QID) | INTRAMUSCULAR | Status: DC | PRN

## 2023-04-20 MED ORDER — MAGNESIUM SULFATE 2 GM/50ML IV SOLN
2.0000 g | Freq: Once | INTRAVENOUS | Status: AC
  Administered 2023-04-20: 2 g via INTRAVENOUS
  Filled 2023-04-20: qty 50

## 2023-04-20 MED ORDER — OXYCODONE HCL 5 MG/5ML PO SOLN: 5.0000 mg | Freq: Once | ORAL | Status: DC | PRN

## 2023-04-20 MED ORDER — FENTANYL CITRATE (PF) 100 MCG/2ML IJ SOLN: 25.0000 ug | INTRAMUSCULAR | Status: DC | PRN

## 2023-04-20 MED ORDER — SODIUM CHLORIDE 0.9 % IV SOLN
500.0000 mg | INTRAVENOUS | Status: DC
  Administered 2023-04-20 – 2023-04-23 (×4): 500 mg via INTRAVENOUS
  Filled 2023-04-20 (×4): qty 5

## 2023-04-20 MED ORDER — POLYETHYLENE GLYCOL 3350 17 G PO PACK: 17.0000 g | PACK | Freq: Every day | ORAL | Status: DC | PRN

## 2023-04-20 MED ORDER — OXYCODONE HCL 5 MG PO TABS
5.0000 mg | ORAL_TABLET | Freq: Four times a day (QID) | ORAL | Status: AC | PRN
Start: 2023-04-20 — End: 2023-04-21
  Administered 2023-04-20 – 2023-04-21 (×4): 5 mg via ORAL
  Filled 2023-04-20 (×5): qty 1

## 2023-04-20 MED ORDER — OXYCODONE HCL 5 MG PO TABS: 5.0000 mg | ORAL_TABLET | Freq: Once | ORAL | Status: DC | PRN

## 2023-04-20 NOTE — Progress Notes (Signed)
  Progress Note    04/20/2023 10:25 AM * Day of Surgery *  Subjective: No overnight issues  Vitals:   04/20/23 0333 04/20/23 0917  BP: (!) 115/56 139/61  Pulse: 73 74  Resp: 17 17  Temp: 98.1 F (36.7 C) 98.3 F (36.8 C)  SpO2: 95% 100%    Physical Exam: Awake and alert Right groin is soft and without hematoma Left foot is warm with stable gangrenous changes of the toes  CBC    Component Value Date/Time   WBC 14.1 (H) 04/20/2023 0321   RBC 3.02 (L) 04/20/2023 0321   HGB 9.3 (L) 04/20/2023 0321   HCT 27.6 (L) 04/20/2023 0321   PLT 224 04/20/2023 0321   MCV 91.4 04/20/2023 0321   MCV 89.5 12/31/2013 2117   MCH 30.8 04/20/2023 0321   MCHC 33.7 04/20/2023 0321   RDW 16.5 (H) 04/20/2023 0321   LYMPHSABS 3.5 04/17/2023 2330   MONOABS 0.9 04/17/2023 2330   EOSABS 0.6 (H) 04/17/2023 2330   BASOSABS 0.1 04/17/2023 2330    BMET    Component Value Date/Time   NA 137 04/20/2023 0321   K 3.7 04/20/2023 0321   CL 106 04/20/2023 0321   CO2 21 (L) 04/20/2023 0321   GLUCOSE 171 (H) 04/20/2023 0321   BUN 25 (H) 04/20/2023 0321   CREATININE 1.83 (H) 04/20/2023 0321   CREATININE 2.01 (H) 12/20/2021 1434   CALCIUM  8.2 (L) 04/20/2023 0321   GFRNONAA 35 (L) 04/20/2023 0321   GFRNONAA 79 09/15/2015 1117   GFRAA 56 (L) 08/31/2018 2017   GFRAA >89 09/15/2015 1117    INR    Component Value Date/Time   INR 1.1 04/17/2023 2330     Intake/Output Summary (Last 24 hours) at 04/20/2023 1025 Last data filed at 04/20/2023 9666 Gross per 24 hour  Intake 716.2 ml  Output 450 ml  Net 266.2 ml     Assessment:  89 y.o. male is s/p left anterior tibial artery angioplasty for atherosclerosis of native arteries with left lower extremity gangrene.  Podiatry does not have plans for amputation and will follow as outpatient.  Plan: Optimized and okay from vascular surgery standpoint for discharge with dual antiplatelet therapy and statin.  Follow-up as been scheduled in the  office.   Nico Syme C. Sheree, MD [facility] Office: [phone] Pager: [phone]  04/20/2023 10:25 AM

## 2023-04-20 NOTE — Progress Notes (Signed)
 PROGRESS NOTE    Justin [name]  FMW:[id number] DOB: 02-Feb-1935 DOA: 04/17/2023 PCP: Marston Junette HERO, MD   Brief Narrative: 89 year old with past medical history significant for diabetes type 2, essential hypertension, hypercholesterolemia, iron  deficiency anemia, chronic pancreatitis, colonic adenocarcinoma 2015, CKD stage IIIb, history of CVA with residual right-sided hemiparesis, TIA, GI bleed, mixed vascular and Alzheimer's dementia, left TMA, resident at his SNF for the last 4 years, wheelchair dependent presented from facility due to altered mental status, concern for facial droop, patient was more awake and conversant upon evaluation, but he does appear he is with advanced dementia.  In the ED MRI brain rule out acute CVA.  He was noted to have right great toe dry gangrene wound, workup was significant for UTI.  Vascular consulted, underwent ultrasound guided right common femoral artery, abdominal aortogram, left lower extremity angiogram, left lower extremity angiogram, left anterior tibial angioplasty 3 x 150 mL in Sterling.    Assessment & Plan:   Principal Problem:   Acute metabolic encephalopathy Active Problems:   Chronic kidney disease, stage 3b ([name])   Type 2 diabetes mellitus with stage 3b chronic kidney disease, with long-term current use of insulin  ([name])   Chronic pancreatitis ([name])   Essential hypertension   Mixed diabetic hyperlipidemia associated with type 2 diabetes mellitus ([name])   Altered mental status   Cystitis   AMS (altered mental status)   Gangrene of toe of left foot ([name])   1-Acute metabolic encephalopathy -In the setting of acute infectious process, secondary to UTI -MRI brain negative for acute CVA -Patient lethargic this morning after he received Ativan  for MRI and oxycodone  for pain last night Start IV thiamine -  Urinary tract infection Continue IV ceftriaxone , urine culture growing Proteus  Left hallux gangrene, tuft of first Distal Phalanx  Osteomyelitis.  PAD: -Patient with known peripheral vascular disease, he has great toe with dry gangrene.  -MRI: Cortical irregularity of the tuft of the first distal phalanx with marrow edema concerning for osteomyelitis.  No drainable fluid collection to suggest abscess.  Marrow edema in the medial hallux sesamoid as can be seen with sesamoiditis versus mild arthritic changes.  Moderate osteoarthritis. -Underwent angiogram by vascular on 1/10: See report above patient was treated with left anterior tibial angioplasty (3x129mm Sterling)  -Continue IV ceftriaxone  will add linezolid  -Patient declined surgery, and family as well. -Palliative care consulted for goals of care Continue Plavix  and aspirin  and a statin  Episode of NSVT No further episode.  Replete Mg  Hypomagnesemia:  -Replete IV  CKD 3B: Creatinine increased to 1.8.  Plan to continue with IV fluids  Hyperlipidemia -Continue with Lipitor   Hypertension Continue with Imdur    History of COPD -continue inhalers.   History of Chronic Pancreatitis - Continue home Creon    History of diabetes Neuropathy - hold repaglinide - Continue gabapentin  -Sliding scale insulin  -Hold long-acting insulin  unclear if patient is going to be eating enough today   See wound care documentation below Pressure Injury 11/24/21 Heel Right Stage 2 -  Partial thickness loss of dermis presenting as a shallow open injury with a red, pink wound bed without slough. (Active)  11/24/21 2200  Location: Heel  Location Orientation: Right  Staging: Stage 2 -  Partial thickness loss of dermis presenting as a shallow open injury with a red, pink wound bed without slough.  Wound Description (Comments):   Present on Admission:   Dressing Type Foam - Lift dressing to assess site every shift 04/20/23 0730  Nutrition Problem: Increased nutrient needs Etiology: wound healing    Signs/Symptoms: estimated needs    Interventions: MVI, Ensure  Enlive (each supplement provides 350kcal and 20 grams of protein)  Estimated body mass index is 19.76 kg/m as calculated from the following:   Height as of this encounter: 6' 2 (1.88 m).   Weight as of this encounter: 69.8 kg.   DVT prophylaxis: Lovenox  Code Status: DNR Family Communication: Son who was at bedside Disposition Plan:  Status is: Inpatient Remains inpatient appropriate because: Management of AKI osteomyelitis, palliative care consulted for goals of care    Consultants:  Vascular Podiatry   Procedures:  none  Antimicrobials:  Ceftriaxone  Linezolid   Subjective: Sleepy, would say few words, denies pain. Does not wants to eat now.   Objective: Vitals:   04/19/23 1523 04/19/23 2007 04/19/23 2331 04/20/23 0333  BP: (!) 159/84 (!) 129/52 (!) 105/50 (!) 115/56  Pulse: 89 (!) 109 (!) 103 73  Resp: 14 16  17   Temp: 98.2 F (36.8 C) 97.8 F (36.6 C) 99.1 F (37.3 C) 98.1 F (36.7 C)  TempSrc: Oral Oral Axillary Axillary  SpO2: 97% 94%  95%  Weight:      Height:        Intake/Output Summary (Last 24 hours) at 04/20/2023 0835 Last data filed at 04/20/2023 0333 Gross per 24 hour  Intake 716.2 ml  Output 1250 ml  Net -533.8 ml   Filed Weights   04/18/23 0735 04/18/23 2100  Weight: 67 kg 69.8 kg    Examination:  General exam: thin, chronic ill appearing  Respiratory system: Clear to auscultation. Respiratory effort normal. Cardiovascular system: S1 & S2 heard, RRR. No JVD, murmurs, rubs, gallops or clicks. No pedal edema. Gastrointestinal system: Abdomen is nondistended, soft and nontender. No organomegaly or masses felt. Normal bowel sounds heard. Central nervous system: sleepy Extremities: Left LE with necrotics toes   Data Reviewed: I have personally reviewed following labs and imaging studies  CBC: Recent Labs  Lab 04/17/23 2328 04/17/23 2330 04/18/23 0738 04/19/23 0525 04/20/23 0321  WBC  --  9.5 9.6 9.4 14.1*  NEUTROABS  --  4.2  --    --   --   HGB 11.9* 11.0* 10.6* 11.0* 9.3*  HCT 35.0* 33.9* 33.0* 33.4* 27.6*  MCV  --  93.4 94.6 91.5 91.4  PLT  --  244 243 252 224   Basic Metabolic Panel: Recent Labs  Lab 04/17/23 2328 04/17/23 2330 04/18/23 0738 04/18/23 2218 04/19/23 0525 04/20/23 0321  NA 141 136 136  --  136 137  K 4.1 4.0 4.1  --  3.6 3.7  CL 107 106 106  --  104 106  CO2  --  22 21*  --  21* 21*  GLUCOSE 200* 203* 162*  --  139* 171*  BUN 30* 27* 25*  --  19 25*  CREATININE 1.70* 1.64* 1.49*  --  1.31* 1.83*  CALCIUM   --  8.9 8.7*  --  8.6* 8.2*  MG  --   --   --  1.7 1.6*  --   PHOS  --   --   --   --   --  3.4   GFR: Estimated Creatinine Clearance: 28.6 mL/min (A) (by C-G formula based on SCr of 1.83 mg/dL (H)). Liver Function Tests: Recent Labs  Lab 04/17/23 2330  AST 22  ALT 14  ALKPHOS 75  BILITOT 0.4  PROT 8.8*  ALBUMIN 3.1*   No results  for input(s): LIPASE, AMYLASE in the last 168 hours. No results for input(s): AMMONIA in the last 168 hours. Coagulation Profile: Recent Labs  Lab 04/17/23 2330  INR 1.1   Cardiac Enzymes: No results for input(s): CKTOTAL, CKMB, CKMBINDEX, TROPONINI in the last 168 hours. BNP (last 3 results) No results for input(s): PROBNP in the last 8760 hours. HbA1C: Recent Labs    04/18/23 0738  HGBA1C 8.8*   CBG: Recent Labs  Lab 04/18/23 0733 04/19/23 1440 04/19/23 1639 04/19/23 2126 04/20/23 0739  GLUCAP 152* 105* 110* 96 111*   Lipid Profile: Recent Labs    04/18/23 0738 04/18/23 2218  CHOL 118 114  HDL 55 53  LDLCALC 53 44  TRIG 49 83  CHOLHDL 2.1 2.2   Thyroid Function Tests: No results for input(s): TSH, T4TOTAL, FREET4, T3FREE, THYROIDAB in the last 72 hours. Anemia Panel: No results for input(s): VITAMINB12, FOLATE, FERRITIN, TIBC, IRON , RETICCTPCT in the last 72 hours. Sepsis Labs: Recent Labs  Lab 04/18/23 2218  PROCALCITON <0.10    Recent Results (from the past 240 hours)   Urine Culture     Status: Abnormal (Preliminary result)   Collection Time: 04/18/23  3:23 AM   Specimen: In/Out Cath Urine  Result Value Ref Range Status   Specimen Description IN/OUT CATH URINE  Final   Special Requests   Final    NONE Performed at [facility], [address] [place], [place] [address]    Culture >=100,000 COLONIES/mL PROTEUS MIRABILIS (A)  Final   Report Status PENDING  Incomplete         Radiology Studies: DG Foot Complete Left Result Date: 04/19/2023 CLINICAL DATA:  Osteomyelitis. EXAM: LEFT FOOT - COMPLETE 3+ VIEW COMPARISON:  04/12/2010. FINDINGS: There is diffuse osteopenia of the visualized osseous structures. No acute fracture or dislocation. No aggressive osseous lesion. There is pseudoarthrosis of the fifth metatarsophalangeal joint. Calcaneal spur noted along the Achilles tendon attachment site. Diffuse mild degenerative changes of imaged joints. No focal soft tissue swelling. No evidence of focal soft tissue defect or air within the soft tissue. No radiopaque foreign bodies. IMPRESSION: *Osteopenia.  No radiographic evidence of osteomyelitis. Electronically Signed   By: Ree Molt M.D.   On: 04/19/2023 17:10   PERIPHERAL VASCULAR CATHETERIZATION Result Date: 04/19/2023 Table formatting from the original result was not included. DATE OF SERVICE: 04/19/2023  PATIENT:  Victory ONEIDA Essex  89 y.o. male  PRE-OPERATIVE DIAGNOSIS:  Atherosclerosis of native arteries of left lower extremity causing gangrene  POST-OPERATIVE DIAGNOSIS:  Same  PROCEDURE:  1) Ultrasound guided right common femoral artery access 2) Abdominal aortogram 3) Left lower extremity angiogram with second order cannulation 4) Additional left lower extremity angiogram with third order cannulation 5) Left anterior tibial angioplasty (3x177mm Sterling)  CPT: 23062, 24374, 63753, 63752, 62771,  SURGEON:  Debby SAILOR. Magda, MD  ASSISTANT: none  ANESTHESIA:   local  ESTIMATED BLOOD LOSS: minimal   LOCAL MEDICATIONS USED:  LIDOCAINE   COUNTS: confirmed correct.  PATIENT DISPOSITION:  PACU - hemodynamically stable.  Delay start of Pharmacological VTE agent (>24hrs) due to surgical blood loss or risk of bleeding: no  INDICATION FOR PROCEDURE: RAYAAN GARGUILO is a 89 y.o. male with left foot gangrene in setting of peripheral arterial disease. After careful discussion of risks, benefits, and alternatives the patient was offered angiogram. The patient understood and wished to proceed.  OPERATIVE FINDINGS:  Left renal artery: not seen Right renal artery: patent Infrarenal aorta: patent Left  common iliac artery: patent Right common iliac artery: patent Left internal iliac artery: patent Right internal iliac artery: patent Left external iliac artery: patent Right external iliac artery: patent Left common femoral artery: patent Right common femoral artery: not studied Left profunda femoris artery: patent Right profunda femoris artery: not studied Left superficial femoral artery: patent Right superficial femoral artery: not studied Left popliteal artery: patent Right popliteal artery: not studied Left anterior tibial artery: severe stenosis in proximal 2/3 of vessel; fills the foot via DP Right anterior tibial artery: not studied Left tibioperoneal trunk: occluded Right tibioperoneal trunk: not studied Left peroneal artery: occluded Right peroneal artery: not studied Left posterior tibial artery: occluded Right posterior tibial artery: not studied Left pedal circulation: disadvantaged Right pedal circulation: not studied  GLASS score. FP 0. IP 3. Stage II  WIfI score. 2 / 3 / 0. Stage IV.  DESCRIPTION OF PROCEDURE: After identification of the patient in the pre-operative holding area, the patient was transferred to the operating room. The patient was positioned supine on the operating room table. Anesthesia was induced. The groins was prepped and draped in standard fashion. A surgical pause was performed confirming  correct patient, procedure, and operative location.  The right groin was anesthetized with subcutaneous injection of 1% lidocaine . Using ultrasound guidance, the right common femoral artery was accessed with micropuncture technique. Fluoroscopy was used to confirm cannulation over the femoral head. The 13F micropuncture sheath was upsized to 41F.  A Benson wire was advanced into the distal aorta. Over the wire an omni flush catheter was advanced to the level of L2. Aortogram was performed - see above for details.  The left common iliac artery was selected with an omniflush catheter and glidewire guidewire. The wire was advanced into the common femoral artery. Over the wire the omni flush catheter was advanced into the external iliac artery. Selective angiography was performed - see above for details.  The decision was made to intervene. The patient was heparinized with 7,000 units of heparin . The 41F sheath was exchanged for a 41F x 65cm sheath. Selective angiography of the left lower extremity with a catheter in the popliteal artery was performed prior to intervention.  The lesions were treated with: Left anterior tibial angioplasty (3x159mm Sterling)  Completion angiography revealed: Resolution of anterior tibial stenosis  The sheath was left in place to be removed in the recovery area.  Upon completion of the case instrument and sharps counts were confirmed correct. The patient was transferred to the PACU in good condition. I was present for all portions of the procedure.  PLAN: ASA / Plavix  / Statin. Pedal flow disadvantaged, so may not heal a toe amputation despite revascularization. He is optimized from a vascular standpoint.  Debby SAILOR. Magda, MD [facility] Phone Number: [phone] 04/19/2023 11:36 AM   MR BRAIN WO CONTRAST Result Date: 04/18/2023 CLINICAL DATA:  Neuro deficit, acute, stroke suspected. Left facial droop. Dysarthria. Past medical history of acute  metabolic encephalopathy. EXAM: MRI HEAD WITHOUT CONTRAST TECHNIQUE: Multiplanar, multiecho pulse sequences of the brain and surrounding structures were obtained without intravenous contrast. COMPARISON:  CT head without contrast 04/17/2023. MR head without contrast 01/17/2023 FINDINGS: Brain: Advanced atrophy and diffuse white matter disease is again noted. No acute infarct or hemorrhage is present. No mass lesion is present. Dilated perivascular spaces are present within the basal ganglia bilaterally. Remote lacunar infarcts are present in the cerebellum bilaterally. Marked thinning of the corpus callosum is  present. The ventricles are proportionate to the degree of atrophy. No significant extraaxial fluid collection is present. The internal auditory canals are within normal limits. Vascular: Flow is present in the major intracranial arteries. Skull and upper cervical spine: The craniocervical junction is normal. Upper cervical spine is within normal limits. Marrow signal is unremarkable. Sinuses/Orbits: A fluid level is present the right maxillary sinus. Mucosal thickening is present in the ethmoid air cells bilaterally. The paranasal sinuses and mastoid air cells are otherwise clear. A right lens replacement is present. Globes and orbits are within normal limits bilaterally. IMPRESSION: 1. No acute intracranial abnormality or significant interval change. 2. Advanced atrophy and diffuse white matter disease likely reflects the sequela of chronic microvascular ischemia. 3. Remote lacunar infarcts of the cerebellum bilaterally. 4. Fluid level in the right maxillary sinus may reflect acute sinusitis. Electronically Signed   By: Lonni Necessary M.D.   On: 04/18/2023 14:06        Scheduled Meds:  ascorbic acid   500 mg Oral Daily   aspirin   81 mg Oral Daily   atorvastatin   40 mg Oral QHS   clopidogrel   75 mg Oral Q breakfast   divalproex   125 mg Oral BID   docusate sodium   100 mg Oral BID    enoxaparin  (LOVENOX ) injection  40 mg Subcutaneous Q24H   feeding supplement  237 mL Oral TID BM   ferrous gluconate   324 mg Oral Daily   fluticasone  furoate-vilanterol  1 puff Inhalation Daily   gabapentin   100 mg Oral BID   insulin  aspart  0-5 Units Subcutaneous QHS   insulin  aspart  0-9 Units Subcutaneous TID WC   insulin  glargine-yfgn  10 Units Subcutaneous QHS   isosorbide  mononitrate  10 mg Oral QHS   lipase/protease/amylase  24,000 Units Oral TID WC   loratadine   10 mg Oral Daily   metoprolol  tartrate  12.5 mg Oral BID   multivitamin with minerals  1 tablet Oral Daily   pantoprazole   40 mg Oral Daily   sertraline   25 mg Oral Daily   sodium chloride  flush  3 mL Intravenous Q12H   sodium chloride  flush  3 mL Intravenous Q12H   zinc  sulfate (50mg  elemental zinc )  220 mg Oral Daily   Continuous Infusions:  sodium chloride      cefTRIAXone  (ROCEPHIN )  IV Stopped (04/19/23 1030)   lactated ringers      magnesium  sulfate bolus IVPB       LOS: 1 day    Time spent: 35 minutes    Deasiah Hagberg A Andree Heeg, MD [facility]   If 7PM-7AM, please contact night-coverage [email or web address]  04/20/2023, 8:35 AM

## 2023-04-20 NOTE — Progress Notes (Signed)
 PODIATRY PROGRESS NOTE Patient Name: Justin [name]  DOB 24-Apr-1934 DOA 04/17/2023  Hospital Day: 4  Assessment:  89 y.o. male with PMHx significant for DM2 with PVD and neuropathy HTN HLD CKD history of CVA dementia with left hallux osteomyelitis and gangrenous changes   XR L foot : no evidence frank erosions on left hallux MRI L foot: Pending  Plan:  - No surgical plans this admission - Stable for discharge from my standpoint and follow up in 2-4 weeks in office for ongoing clinical monitoring of wounds - No abx indicated for toe at this time - Betadine paint apply to left great toe ever 2-3 days, no dsg needed. - Will sign off at this time, epic chat with questions or concerns.  Marolyn JULIANNA Honour, DPM [facility]    Subjective:  Pt seen in pacu. He tells me he does not want surgery.  I discussed with his sister wyatte dames on the phone again this AM. She states she has been told by the patient prior to admission that he is tired and does not want any more surgery. Wants to monitor the toe as an outpatient.  I said that's fine from my standpoint. She was made aware that if OM is present it could cause spreading infection into the foot or blood causing loss of limb/ life and she understands this.  Of note when I talked to pts sister yesterday she states wanted to hear MRI results and then make decision but this AM states regardless of MRI findings no more surgeries.   Objective:   Vitals:   04/20/23 0333 04/20/23 0917  BP: (!) 115/56 139/61  Pulse: 73 74  Resp: 17 17  Temp: 98.1 F (36.7 C) 98.3 F (36.8 C)  SpO2: 95% 100%       Latest Ref Rng & Units 04/20/2023    3:21 AM 04/19/2023    5:25 AM 04/18/2023    7:38 AM  CBC  WBC 4.0 - 10.5 K/uL 14.1  9.4  9.6   Hemoglobin 13.0 - 17.0 g/dL 9.3  88.9  89.3   Hematocrit 39.0 - 52.0 % 27.6  33.4  33.0   Platelets 150 - 400 K/uL 224  252  243        Latest Ref Rng & Units 04/20/2023    3:21 AM 04/19/2023     5:25 AM 04/18/2023    7:38 AM  BMP  Glucose 70 - 99 mg/dL 828  860  837   BUN 8 - 23 mg/dL 25  19  25    Creatinine 0.61 - 1.24 mg/dL 8.16  8.68  8.50   Sodium 135 - 145 mmol/L 137  136  136   Potassium 3.5 - 5.1 mmol/L 3.7  3.6  4.1   Chloride 98 - 111 mmol/L 106  104  106   CO2 22 - 32 mmol/L 21  21  21    Calcium  8.9 - 10.3 mg/dL 8.2  8.6  8.7     General: AAOx3, NAD  Lower Extremity Exam Vasc:     R - PT 1/4 palpable, DP 1/4palpable.                L - PT 1/4 palpable, DP 1/4 palpable. Cap refill absent to hallux   Derm:    R -TMA without open wound               L -necrosis of the distal tuft of the left  hallux.  Eschar in place.  No active drainage.  Mild erythema and edema of the toe.   MSK:     R - prior TMA appears well-healed               L -  No gross deformities. Compartments soft, non-tender, compressible   Neuro:R - Gross sensation diminished. Gross motor function intact                 L - Gross sensation diminished. Gross motor function intact  Radiology:  Results reviewed. See assessment for pertinent imaging results

## 2023-04-20 NOTE — Progress Notes (Addendum)
 OT Cancellation Note  Patient Details Name: Justin [name] MRN: [id number] DOB: 17-Mar-1935   Cancelled Treatment:    Reason Eval/Treat Not Completed: Patient at procedure or test/ unavailable.  9:30 - Pt is off the floor at this time for toe amputation. OT will hold at this time and check back for medical readiness.  10:00 - Pt refused surgery in PACU, returning to floor 12:20 - Pt agitated when awake, refusing all services, RN [place] requested hold for now due to agitation and refusals.   Valentin Nightingale, OTR/L [facility] Lynnie Koehler Elane Nightingale 04/20/2023, 9:32 AM

## 2023-04-20 NOTE — Progress Notes (Signed)
 CSW attempted to call pt's daughter again and it went straight to voicemail. Per note review it appears that pt is a LTC resident at [facility].

## 2023-04-20 NOTE — Anesthesia Preprocedure Evaluation (Signed)
 Anesthesia Evaluation  Patient identified by MRN, date of birth, ID band Patient awake    Reviewed: Allergy & Precautions, H&P , NPO status , Patient's Chart, lab work & pertinent test results  Airway Mallampati: II   Neck ROM: full    Dental   Pulmonary former smoker   breath sounds clear to auscultation       Cardiovascular hypertension, + CAD and + Peripheral Vascular Disease   Rhythm:regular Rate:Normal     Neuro/Psych  Headaches TIA Neuromuscular disease CVA    GI/Hepatic hiatal hernia,GERD  ,,  Endo/Other  diabetes, Type 2    Renal/GU Renal InsufficiencyRenal disease     Musculoskeletal  (+) Arthritis ,    Abdominal   Peds  Hematology  (+) Blood dyscrasia, anemia   Anesthesia Other Findings   Reproductive/Obstetrics                             Anesthesia Physical Anesthesia Plan  ASA: 3  Anesthesia Plan: MAC   Post-op Pain Management:    Induction: Intravenous  PONV Risk Score and Plan: 1 and Propofol  infusion and Treatment may vary due to age or medical condition  Airway Management Planned: Simple Face Mask  Additional Equipment:   Intra-op Plan:   Post-operative Plan:   Informed Consent: I have reviewed the patients History and Physical, chart, labs and discussed the procedure including the risks, benefits and alternatives for the proposed anesthesia with the patient or authorized representative who has indicated his/her understanding and acceptance.     Dental advisory given  Plan Discussed with: CRNA, Anesthesiologist and Surgeon  Anesthesia Plan Comments:        Anesthesia Quick Evaluation

## 2023-04-21 DIAGNOSIS — G9341 Metabolic encephalopathy: Secondary | ICD-10-CM | POA: Diagnosis not present

## 2023-04-21 LAB — BASIC METABOLIC PANEL
Anion gap: 7 (ref 5–15)
BUN: 24 mg/dL — ABNORMAL HIGH (ref 8–23)
CO2: 22 mmol/L (ref 22–32)
Calcium: 8.1 mg/dL — ABNORMAL LOW (ref 8.9–10.3)
Chloride: 105 mmol/L (ref 98–111)
Creatinine, Ser: 1.79 mg/dL — ABNORMAL HIGH (ref 0.61–1.24)
GFR, Estimated: 36 mL/min — ABNORMAL LOW (ref >60)
Glucose, Bld: 172 mg/dL — ABNORMAL HIGH (ref 70–99)
Potassium: 3.7 mmol/L (ref 3.5–5.1)
Sodium: 134 mmol/L — ABNORMAL LOW (ref 135–145)

## 2023-04-21 LAB — CBC
HCT: 25.9 % — ABNORMAL LOW (ref 39.0–52.0)
Hemoglobin: 8.5 g/dL — ABNORMAL LOW (ref 13.0–17.0)
MCH: 30 pg (ref 26.0–34.0)
MCHC: 32.8 g/dL (ref 30.0–36.0)
MCV: 91.5 fL (ref 80.0–100.0)
Platelets: 191 10*3/uL (ref 150–400)
RBC: 2.83 MIL/uL — ABNORMAL LOW (ref 4.22–5.81)
RDW: 16.8 % — ABNORMAL HIGH (ref 11.5–15.5)
WBC: 9.7 10*3/uL (ref 4.0–10.5)
nRBC: 0 % (ref 0.0–0.2)

## 2023-04-21 LAB — GLUCOSE, CAPILLARY
Glucose-Capillary: 121 mg/dL — ABNORMAL HIGH (ref 70–99)
Glucose-Capillary: 184 mg/dL — ABNORMAL HIGH (ref 70–99)
Glucose-Capillary: 135 mg/dL — ABNORMAL HIGH (ref 70–99)
Glucose-Capillary: 89 mg/dL (ref 70–99)

## 2023-04-21 LAB — MAGNESIUM: Magnesium: 2 mg/dL (ref 1.7–2.4)

## 2023-04-21 LAB — PHOSPHORUS: Phosphorus: 2.5 mg/dL (ref 2.5–4.6)

## 2023-04-21 MED ORDER — LACTATED RINGERS IV SOLN: INTRAVENOUS | Status: AC

## 2023-04-21 NOTE — Progress Notes (Signed)
 PROGRESS NOTE    Justin [name]  FMW:[id number] DOB: 13-Mar-1935 DOA: 04/17/2023 PCP: Marston Junette HERO, MD   Brief Narrative: 89 year old with past medical history significant for diabetes type 2, essential hypertension, hypercholesterolemia, iron  deficiency anemia, chronic pancreatitis, colonic adenocarcinoma 2015, CKD stage IIIb, history of CVA with residual right-sided hemiparesis, TIA, GI bleed, mixed vascular and Alzheimer's dementia, left TMA, resident at his SNF for the last 4 years, wheelchair dependent presented from facility due to altered mental status, concern for facial droop, patient was more awake and conversant upon evaluation, but he does appear he is with advanced dementia.  In the ED MRI brain rule out acute CVA.  He was noted to have right great toe dry gangrene wound, workup was significant for UTI.  Vascular consulted, underwent ultrasound guided right common femoral artery, abdominal aortogram, left lower extremity angiogram, left lower extremity angiogram, left anterior tibial angioplasty 3 x 150 mL in [place].    Assessment & Plan:   Principal Problem:   Acute metabolic encephalopathy Active Problems:   Chronic kidney disease, stage 3b ([name])   Type 2 diabetes mellitus with stage 3b chronic kidney disease, with long-term current use of insulin  ([name])   Chronic pancreatitis ([name])   Essential hypertension   Mixed diabetic hyperlipidemia associated with type 2 diabetes mellitus ([name])   Altered mental status   Cystitis   AMS (altered mental status)   Gangrene of toe of left foot ([name])   1-Acute metabolic encephalopathy -In the setting of acute infectious process, Secondary to UTI -MRI brain negative for acute CVA. -Patient lethargic this morning after he received Ativan  for MRI and oxycodone  for pain last night Started  IV thiamine - He is more Alert, answer questions. Yesterday he was lethargic likely related to ativan , oxy   Urinary tract infection Continue  IV ceftriaxone , urine culture growing Proteus  Left hallux gangrene, tuft of first Distal Phalanx Osteomyelitis.  PAD: -Patient with known peripheral vascular disease, he has great toe with dry gangrene.  -MRI: Cortical irregularity of the tuft of the first distal phalanx with marrow edema concerning for osteomyelitis.  No drainable fluid collection to suggest abscess.  Marrow edema in the medial hallux sesamoid as can be seen with sesamoiditis versus mild arthritic changes.  Moderate osteoarthritis. -Underwent angiogram by vascular on 1/10: See report above patient was treated with left anterior tibial angioplasty (3x128mm Sterling)  -Continue IV ceftriaxone  will added linezolid  -Patient declined surgery, and family as well. -Palliative care consulted for goals of care Continue Plavix  and aspirin  and a statin Will discussed with ID tomorrow in regards antibiotics for discharge.   Episode of NSVT No further episode.  Replete Mg  Hypomagnesemia:  -Replaced.   CKD 3B: Creatinine increased to 1.8.  Plan to continue with IV fluids Previous Cr baseline 1.5--1.6 Cr down to 1.7 Continue fluids for another 24 hour  Hyperlipidemia -Continue with Lipitor   Hypertension Continue with Imdur    History of COPD -continue inhalers.   History of Chronic Pancreatitis - Continue home Creon    History of diabetes Neuropathy - hold repaglinide - Continue gabapentin  -Sliding scale insulin  -Hold long-acting insulin  unclear if patient is going to be eating enough today   See wound care documentation below Pressure Injury 11/24/21 Heel Right Stage 2 -  Partial thickness loss of dermis presenting as a shallow open injury with a red, pink wound bed without slough. (Active)  11/24/21 2200  Location: Heel  Location Orientation: Right  Staging: Stage 2 -  Partial thickness  loss of dermis presenting as a shallow open injury with a red, pink wound bed without slough.  Wound Description (Comments):    Present on Admission:   Dressing Type Foam - Lift dressing to assess site every shift 04/21/23 0800     Nutrition Problem: Increased nutrient needs Etiology: wound healing    Signs/Symptoms: estimated needs    Interventions: MVI, Ensure Enlive (each supplement provides 350kcal and 20 grams of protein)  Estimated body mass index is 19.76 kg/m as calculated from the following:   Height as of this encounter: 6' 2 (1.88 m).   Weight as of this encounter: 69.8 kg.   DVT prophylaxis: Lovenox  Code Status: DNR Family Communication: Daughter at bedside Disposition Plan:  Status is: Inpatient Remains inpatient appropriate because: Management of AKI osteomyelitis, palliative care consulted for goals of care    Consultants:  Vascular Podiatry   Procedures:  none  Antimicrobials:  Ceftriaxone  Linezolid   Subjective: She is more alert, does not wants to eat. He doesn't like hospital food.  Daughter will bring him food.  She wants to speak with palliative, inclining to proceed with hospice care.   Objective: Vitals:   04/20/23 2355 04/21/23 0356 04/21/23 0742 04/21/23 1208  BP: (!) 153/68 122/63 (!) 151/74 (!) 159/85  Pulse: 92 78 76 79  Resp: 20 17 18 18   Temp: 99 F (37.2 C) 98.5 F (36.9 C) 98.1 F (36.7 C) 98 F (36.7 C)  TempSrc: Oral Oral Oral Oral  SpO2: 100% 100% 100% 91%  Weight:      Height:        Intake/Output Summary (Last 24 hours) at 04/21/2023 1631 Last data filed at 04/21/2023 1406 Gross per 24 hour  Intake 2994.08 ml  Output 400 ml  Net 2594.08 ml   Filed Weights   04/18/23 0735 04/18/23 2100  Weight: 67 kg 69.8 kg    Examination:  General exam: Frail, Thin appearing, chronic ill appearing.  Respiratory system: CTA Cardiovascular system: S 1, S 2 RRR Gastrointestinal system: BS present, soft,  Central nervous system: alert Extremities: Left LE with necrotics toes   Data Reviewed: I have personally reviewed following labs and  imaging studies  CBC: Recent Labs  Lab 04/17/23 2330 04/18/23 0738 04/19/23 0525 04/20/23 0321 04/21/23 0328  WBC 9.5 9.6 9.4 14.1* 9.7  NEUTROABS 4.2  --   --   --   --   HGB 11.0* 10.6* 11.0* 9.3* 8.5*  HCT 33.9* 33.0* 33.4* 27.6* 25.9*  MCV 93.4 94.6 91.5 91.4 91.5  PLT 244 243 252 224 191   Basic Metabolic Panel: Recent Labs  Lab 04/17/23 2330 04/18/23 0738 04/18/23 2218 04/19/23 0525 04/20/23 0321 04/20/23 0905 04/21/23 0328  NA 136 136  --  136 137  --  134*  K 4.0 4.1  --  3.6 3.7  --  3.7  CL 106 106  --  104 106  --  105  CO2 22 21*  --  21* 21*  --  22  GLUCOSE 203* 162*  --  139* 171*  --  172*  BUN 27* 25*  --  19 25*  --  24*  CREATININE 1.64* 1.49*  --  1.31* 1.83*  --  1.79*  CALCIUM  8.9 8.7*  --  8.6* 8.2*  --  8.1*  MG  --   --  1.7 1.6*  --  1.7 2.0  PHOS  --   --   --   --  3.4  --  2.5   GFR: Estimated Creatinine Clearance: 29.2 mL/min (A) (by C-G formula based on SCr of 1.79 mg/dL (H)). Liver Function Tests: Recent Labs  Lab 04/17/23 2330  AST 22  ALT 14  ALKPHOS 75  BILITOT 0.4  PROT 8.8*  ALBUMIN 3.1*   No results for input(s): LIPASE, AMYLASE in the last 168 hours. No results for input(s): AMMONIA in the last 168 hours. Coagulation Profile: Recent Labs  Lab 04/17/23 2330  INR 1.1   Cardiac Enzymes: No results for input(s): CKTOTAL, CKMB, CKMBINDEX, TROPONINI in the last 168 hours. BNP (last 3 results) No results for input(s): PROBNP in the last 8760 hours. HbA1C: No results for input(s): HGBA1C in the last 72 hours.  CBG: Recent Labs  Lab 04/20/23 1218 04/20/23 1620 04/20/23 2122 04/21/23 0741 04/21/23 1206  GLUCAP 95 134* 125* 121* 184*   Lipid Profile: Recent Labs    04/18/23 2218  CHOL 114  HDL 53  LDLCALC 44  TRIG 83  CHOLHDL 2.2   Thyroid Function Tests: No results for input(s): TSH, T4TOTAL, FREET4, T3FREE, THYROIDAB in the last 72 hours. Anemia Panel: No results for  input(s): VITAMINB12, FOLATE, FERRITIN, TIBC, IRON , RETICCTPCT in the last 72 hours. Sepsis Labs: Recent Labs  Lab 04/18/23 2218  PROCALCITON <0.10    Recent Results (from the past 240 hours)  Urine Culture     Status: Abnormal   Collection Time: 04/18/23  3:23 AM   Specimen: In/Out Cath Urine  Result Value Ref Range Status   Specimen Description IN/OUT CATH URINE  Final   Special Requests   Final    NONE Performed at [facility], [address], [place], [place] [address]    Culture >=100,000 COLONIES/mL PROTEUS MIRABILIS (A)  Final   Report Status 04/20/2023 FINAL  Final   Organism ID, Bacteria PROTEUS MIRABILIS (A)  Final      Susceptibility   Proteus mirabilis - MIC*    AMPICILLIN <=2 SENSITIVE Sensitive     CEFAZOLIN 8 SENSITIVE Sensitive     CEFEPIME  <=0.12 SENSITIVE Sensitive     CEFTRIAXONE  <=0.25 SENSITIVE Sensitive     CIPROFLOXACIN >=4 RESISTANT Resistant     GENTAMICIN <=1 SENSITIVE Sensitive     IMIPENEM 4 SENSITIVE Sensitive     NITROFURANTOIN 128 RESISTANT Resistant     TRIMETH/SULFA <=20 SENSITIVE Sensitive     AMPICILLIN/SULBACTAM <=2 SENSITIVE Sensitive     PIP/TAZO <=4 SENSITIVE Sensitive ug/mL    * >=100,000 COLONIES/mL PROTEUS MIRABILIS         Radiology Studies: MR FOOT LEFT WO CONTRAST Result Date: 04/20/2023 CLINICAL DATA:  Left foot abnormality.  Evaluate for osteomyelitis. EXAM: MRI OF THE LEFT FOOT WITHOUT CONTRAST TECHNIQUE: Multiplanar, multisequence MR imaging of the left foot was performed. No intravenous contrast was administered. COMPARISON:  Left foot 04/19/2023 FINDINGS: Bones/Joint/Cartilage No fracture or dislocation. Normal alignment. No joint effusion. Moderate osteoarthritis of the first MTP joint. Mild osteoarthritis of the first TMT joint. Mild osteoarthritis of the talonavicular joint. Marrow edema in the medial hallux sesamoid as can be seen with sesamoiditis versus mild arthritic changes. Cortical irregularity  of the tuft of the first distal phalanx with marrow edema concerning for osteomyelitis. Ligaments Collateral ligaments are intact.  Lisfranc ligament is intact. Muscles and Tendons Flexor, peroneal and extensor compartment tendons are intact. T2 hyperintense signal throughout the plantar musculature likely neurogenic. Mild generalized muscle atrophy. Soft tissue No fluid collection or hematoma.  No soft tissue mass. IMPRESSION: 1. Cortical  irregularity of the tuft of the first distal phalanx with marrow edema concerning for osteomyelitis. No drainable fluid collection to suggest an abscess. 2. Marrow edema in the medial hallux sesamoid as can be seen with sesamoiditis versus mild arthritic changes. 3. Moderate osteoarthritis of the first MTP joint. Mild osteoarthritis of the first TMT joint. Mild osteoarthritis of the talonavicular joint. Electronically Signed   By: Julaine Blanch M.D.   On: 04/20/2023 09:50        Scheduled Meds:  ascorbic acid   500 mg Oral Daily   aspirin   81 mg Oral Daily   atorvastatin   40 mg Oral QHS   clopidogrel   75 mg Oral Q breakfast   divalproex   125 mg Oral BID   docusate sodium   100 mg Oral BID   enoxaparin  (LOVENOX ) injection  30 mg Subcutaneous Q24H   feeding supplement  237 mL Oral TID BM   ferrous gluconate   324 mg Oral Daily   fluticasone  furoate-vilanterol  1 puff Inhalation Daily   gabapentin   100 mg Oral BID   insulin  aspart  0-5 Units Subcutaneous QHS   insulin  aspart  0-9 Units Subcutaneous TID WC   isosorbide  mononitrate  10 mg Oral QHS   lipase/protease/amylase  24,000 Units Oral TID WC   loratadine   10 mg Oral Daily   metoprolol  tartrate  12.5 mg Oral BID   multivitamin with minerals  1 tablet Oral Daily   pantoprazole   40 mg Oral Daily   sodium chloride  flush  3 mL Intravenous Q12H   sodium chloride  flush  3 mL Intravenous Q12H   zinc  sulfate (50mg  elemental zinc )  220 mg Oral Daily   Continuous Infusions:  cefTRIAXone  (ROCEPHIN )  IV 200 mL/hr  at 04/21/23 1023   lactated ringers  75 mL/hr at 04/21/23 1303   linezolid  (ZYVOX ) IV 600 mg (04/21/23 1100)   thiamine  (VITAMIN B1) injection 500 mg (04/21/23 1406)     LOS: 2 days    Time spent: 35 minutes    Mikaeel Petrow A Tienna Bienkowski, MD [facility]   If 7PM-7AM, please contact night-coverage [email or web address]  04/21/2023, 4:31 PM

## 2023-04-22 ENCOUNTER — Encounter (HOSPITAL_COMMUNITY): Payer: Self-pay | Admitting: Vascular Surgery

## 2023-04-22 DIAGNOSIS — Z515 Encounter for palliative care: Secondary | ICD-10-CM

## 2023-04-22 DIAGNOSIS — Z7189 Other specified counseling: Secondary | ICD-10-CM | POA: Diagnosis not present

## 2023-04-22 DIAGNOSIS — G9340 Encephalopathy, unspecified: Secondary | ICD-10-CM

## 2023-04-22 DIAGNOSIS — Z66 Do not resuscitate: Secondary | ICD-10-CM

## 2023-04-22 DIAGNOSIS — Z789 Other specified health status: Secondary | ICD-10-CM

## 2023-04-22 DIAGNOSIS — G9341 Metabolic encephalopathy: Secondary | ICD-10-CM | POA: Diagnosis not present

## 2023-04-22 LAB — CBC
HCT: 26.6 % — ABNORMAL LOW (ref 39.0–52.0)
Hemoglobin: 9 g/dL — ABNORMAL LOW (ref 13.0–17.0)
MCH: 30.5 pg (ref 26.0–34.0)
MCHC: 33.8 g/dL (ref 30.0–36.0)
MCV: 90.2 fL (ref 80.0–100.0)
Platelets: 176 10*3/uL (ref 150–400)
RBC: 2.95 MIL/uL — ABNORMAL LOW (ref 4.22–5.81)
RDW: 16.3 % — ABNORMAL HIGH (ref 11.5–15.5)
WBC: 10.1 10*3/uL (ref 4.0–10.5)
nRBC: 0 % (ref 0.0–0.2)

## 2023-04-22 LAB — GLUCOSE, CAPILLARY
Glucose-Capillary: 103 mg/dL — ABNORMAL HIGH (ref 70–99)
Glucose-Capillary: 244 mg/dL — ABNORMAL HIGH (ref 70–99)
Glucose-Capillary: 272 mg/dL — ABNORMAL HIGH (ref 70–99)
Glucose-Capillary: 172 mg/dL — ABNORMAL HIGH (ref 70–99)
Glucose-Capillary: 79 mg/dL (ref 70–99)

## 2023-04-22 LAB — BASIC METABOLIC PANEL
Anion gap: 9 (ref 5–15)
BUN: 18 mg/dL (ref 8–23)
CO2: 23 mmol/L (ref 22–32)
Calcium: 8 mg/dL — ABNORMAL LOW (ref 8.9–10.3)
Chloride: 101 mmol/L (ref 98–111)
Creatinine, Ser: 1.43 mg/dL — ABNORMAL HIGH (ref 0.61–1.24)
GFR, Estimated: 48 mL/min — ABNORMAL LOW (ref >60)
Glucose, Bld: 118 mg/dL — ABNORMAL HIGH (ref 70–99)
Potassium: 3.6 mmol/L (ref 3.5–5.1)
Sodium: 133 mmol/L — ABNORMAL LOW (ref 135–145)

## 2023-04-22 LAB — PHOSPHORUS: Phosphorus: 2.4 mg/dL — ABNORMAL LOW (ref 2.5–4.6)

## 2023-04-22 MED ORDER — FLUTICASONE FUROATE-VILANTEROL 100-25 MCG/ACT IN AEPB
1.0000 | INHALATION_SPRAY | Freq: Every day | RESPIRATORY_TRACT | Status: DC
Start: 2023-04-22 — End: 2023-04-23
  Administered 2023-04-22 – 2023-04-23 (×2): 1 via RESPIRATORY_TRACT
  Filled 2023-04-22: qty 28

## 2023-04-22 MED ORDER — ENOXAPARIN SODIUM 40 MG/0.4ML IJ SOSY
40.0000 mg | PREFILLED_SYRINGE | INTRAMUSCULAR | Status: DC
  Administered 2023-04-22: 40 mg via SUBCUTANEOUS
  Filled 2023-04-22: qty 0.4

## 2023-04-22 MED ORDER — K PHOS MONO-SOD PHOS DI & MONO 155-852-130 MG PO TABS
250.0000 mg | ORAL_TABLET | Freq: Every day | ORAL | Status: AC
  Administered 2023-04-22 – 2023-04-23 (×2): 250 mg via ORAL
  Filled 2023-04-22 (×2): qty 1

## 2023-04-22 NOTE — Progress Notes (Signed)
 PROGRESS NOTE    Justin [name]  FMW:[id number] DOB: [birth year] DOA: 04/17/2023 PCP: Marston Junette HERO, MD   Brief Narrative: 89 year old with past medical history significant for diabetes type 2, essential hypertension, hypercholesterolemia, iron  deficiency anemia, chronic pancreatitis, colonic adenocarcinoma 2015, CKD stage IIIb, history of CVA with residual right-sided hemiparesis, TIA, GI bleed, mixed vascular and Alzheimer's dementia, left TMA, resident at his SNF for the last 4 years, wheelchair dependent presented from facility due to altered mental status, concern for facial droop, patient was more awake and conversant upon evaluation, but he does appear he is with advanced dementia.  In the ED MRI brain rule out acute CVA.  He was noted to have right great toe dry gangrene wound, workup was significant for UTI.  Vascular consulted, underwent ultrasound guided right common femoral artery, abdominal aortogram, left lower extremity angiogram, left lower extremity angiogram, left anterior tibial angioplasty 3 x 150 mL in [place].    Assessment & Plan:   Principal Problem:   Acute metabolic encephalopathy Active Problems:   Chronic kidney disease, stage 3b ([name])   Type 2 diabetes mellitus with stage 3b chronic kidney disease, with long-term current use of insulin  ([name])   Chronic pancreatitis ([name])   Essential hypertension   Mixed diabetic hyperlipidemia associated with type 2 diabetes mellitus ([name])   Altered mental status   Cystitis   AMS (altered mental status)   Gangrene of toe of left foot ([name])   1-Acute metabolic encephalopathy -In the setting of acute infectious process, Secondary to UTI -MRI brain negative for acute CVA. -Patient lethargic this morning after he received Ativan  for MRI and oxycodone  for pain last night Started  IV thiamine - He is more alert, denies pain    Urinary tract infection Continue IV ceftriaxone , urine culture growing Proteus  Left hallux  gangrene, tuft of first Distal Phalanx Osteomyelitis.  PAD: -Patient with known peripheral vascular disease, he has great toe with dry gangrene.  -MRI: Cortical irregularity of the tuft of the first distal phalanx with marrow edema concerning for osteomyelitis.  No drainable fluid collection to suggest abscess.  Marrow edema in the medial hallux sesamoid as can be seen with sesamoiditis versus mild arthritic changes.  Moderate osteoarthritis. -Underwent angiogram by vascular on 1/10: See report above patient was treated with left anterior tibial angioplasty (3x187mm [place])  -Continue IV ceftriaxone  will added linezolid  -Patient declined surgery, and family as well. -Palliative care consulted for goals of care Continue Plavix  and aspirin  and a statin Discussed with ID, we can use Augmentin  at discharge.  Family wanted to speak with Vascular and Podiatry again about Surgery, patient now said he would want sx. Vascular , podiatry informed.  --family have decided against sx. Dr Malvin call Son, and plan is for conservative approach, oral antibiotics, and follow up out patient.   Episode of NSVT No further episode.  Replete Mg  Hypomagnesemia:  -Replaced.   CKD 3B: Creatinine increased to 1.8.  Plan to continue with IV fluids Previous Cr baseline 1.5--1.6 Cr down to 1.7 Continue fluids for another 24 hour  Hyperlipidemia -Continue with Lipitor   Hypertension Continue with Imdur    History of COPD -continue inhalers.   History of Chronic Pancreatitis - Continue home Creon    History of diabetes Neuropathy - hold repaglinide - Continue gabapentin  -Sliding scale insulin  -Hold long-acting insulin  unclear if patient is going to be eating enough today Hypophosphatemia; replete   See wound care documentation below Pressure Injury 11/24/21 Heel Right Stage 2 -  Partial thickness loss of dermis presenting as a shallow open injury with a red, pink wound bed without slough.  (Active)  11/24/21 2200  Location: Heel  Location Orientation: Right  Staging: Stage 2 -  Partial thickness loss of dermis presenting as a shallow open injury with a red, pink wound bed without slough.  Wound Description (Comments):   Present on Admission:   Dressing Type Foam - Lift dressing to assess site every shift 04/22/23 9185     Nutrition Problem: Increased nutrient needs Etiology: wound healing    Signs/Symptoms: estimated needs    Interventions: MVI, Ensure Enlive (each supplement provides 350kcal and 20 grams of protein)  Estimated body mass index is 19.76 kg/m as calculated from the following:   Height as of this encounter: 6' 2 (1.88 m).   Weight as of this encounter: 69.8 kg.   DVT prophylaxis: Lovenox  Code Status: DNR Family Communication: Daughter at bedside Disposition Plan:  Status is: Inpatient Remains inpatient appropriate because: Management of AKI osteomyelitis, palliative care consulted for goals of care    Consultants:  Vascular Podiatry   Procedures:  none  Antimicrobials:  Ceftriaxone  Linezolid   Subjective: He is alert, he has been saying he would want sx. Family would like to know risk of sx and chances of healing. Vascular and podiatry contacted   Objective: Vitals:   04/21/23 2036 04/22/23 0500 04/22/23 0700 04/22/23 0814  BP: 139/73 (!) 144/68  (!) 146/78  Pulse: 74 80  72  Resp: 18   19  Temp: 98.6 F (37 C) 98.8 F (37.1 C)  98.6 F (37 C)  TempSrc: Oral Oral  Oral  SpO2: 95% 93% 96% 97%  Weight:      Height:        Intake/Output Summary (Last 24 hours) at 04/22/2023 1618 Last data filed at 04/22/2023 1100 Gross per 24 hour  Intake 2775.8 ml  Output 400 ml  Net 2375.8 ml   Filed Weights   04/18/23 0735 04/18/23 2100  Weight: 67 kg 69.8 kg    Examination:  General exam: Frail, chronic ill appearing.  Respiratory system: CTA Cardiovascular system: S 1, S 2 RRR Gastrointestinal system: BS present, soft,  nt Central nervous system: alert Extremities: Left LE with necrotics toes   Data Reviewed: I have personally reviewed following labs and imaging studies  CBC: Recent Labs  Lab 04/17/23 2330 04/18/23 0738 04/19/23 0525 04/20/23 0321 04/21/23 0328 04/22/23 0736  WBC 9.5 9.6 9.4 14.1* 9.7 10.1  NEUTROABS 4.2  --   --   --   --   --   HGB 11.0* 10.6* 11.0* 9.3* 8.5* 9.0*  HCT 33.9* 33.0* 33.4* 27.6* 25.9* 26.6*  MCV 93.4 94.6 91.5 91.4 91.5 90.2  PLT 244 243 252 224 191 176   Basic Metabolic Panel: Recent Labs  Lab 04/18/23 0738 04/18/23 2218 04/19/23 0525 04/20/23 0321 04/20/23 0905 04/21/23 0328 04/22/23 0736  NA 136  --  136 137  --  134* 133*  K 4.1  --  3.6 3.7  --  3.7 3.6  CL 106  --  104 106  --  105 101  CO2 21*  --  21* 21*  --  22 23  GLUCOSE 162*  --  139* 171*  --  172* 118*  BUN 25*  --  19 25*  --  24* 18  CREATININE 1.49*  --  1.31* 1.83*  --  1.79* 1.43*  CALCIUM  8.7*  --  8.6* 8.2*  --  8.1* 8.0*  MG  --  1.7 1.6*  --  1.7 2.0  --   PHOS  --   --   --  3.4  --  2.5 2.4*   GFR: Estimated Creatinine Clearance: 36.6 mL/min (A) (by C-G formula based on SCr of 1.43 mg/dL (H)). Liver Function Tests: Recent Labs  Lab 04/17/23 2330  AST 22  ALT 14  ALKPHOS 75  BILITOT 0.4  PROT 8.8*  ALBUMIN 3.1*   No results for input(s): LIPASE, AMYLASE in the last 168 hours. No results for input(s): AMMONIA in the last 168 hours. Coagulation Profile: Recent Labs  Lab 04/17/23 2330  INR 1.1   Cardiac Enzymes: No results for input(s): CKTOTAL, CKMB, CKMBINDEX, TROPONINI in the last 168 hours. BNP (last 3 results) No results for input(s): PROBNP in the last 8760 hours. HbA1C: No results for input(s): HGBA1C in the last 72 hours.  CBG: Recent Labs  Lab 04/21/23 1647 04/21/23 2113 04/22/23 0734 04/22/23 1158 04/22/23 1200  GLUCAP 135* 89 103* 244* 272*   Lipid Profile: No results for input(s): CHOL, HDL, LDLCALC, TRIG,  CHOLHDL, LDLDIRECT in the last 72 hours.  Thyroid Function Tests: No results for input(s): TSH, T4TOTAL, FREET4, T3FREE, THYROIDAB in the last 72 hours. Anemia Panel: No results for input(s): VITAMINB12, FOLATE, FERRITIN, TIBC, IRON , RETICCTPCT in the last 72 hours. Sepsis Labs: Recent Labs  Lab 04/18/23 2218  PROCALCITON <0.10    Recent Results (from the past 240 hours)  Urine Culture     Status: Abnormal   Collection Time: 04/18/23  3:23 AM   Specimen: In/Out Cath Urine  Result Value Ref Range Status   Specimen Description IN/OUT CATH URINE  Final   Special Requests   Final    NONE Performed at [facility], [address], [place], [place] [address]    Culture >=100,000 COLONIES/mL PROTEUS MIRABILIS (A)  Final   Report Status 04/20/2023 FINAL  Final   Organism ID, Bacteria PROTEUS MIRABILIS (A)  Final      Susceptibility   Proteus mirabilis - MIC*    AMPICILLIN <=2 SENSITIVE Sensitive     CEFAZOLIN 8 SENSITIVE Sensitive     CEFEPIME  <=0.12 SENSITIVE Sensitive     CEFTRIAXONE  <=0.25 SENSITIVE Sensitive     CIPROFLOXACIN >=4 RESISTANT Resistant     GENTAMICIN <=1 SENSITIVE Sensitive     IMIPENEM 4 SENSITIVE Sensitive     NITROFURANTOIN 128 RESISTANT Resistant     TRIMETH/SULFA <=20 SENSITIVE Sensitive     AMPICILLIN/SULBACTAM <=2 SENSITIVE Sensitive     PIP/TAZO <=4 SENSITIVE Sensitive ug/mL    * >=100,000 COLONIES/mL PROTEUS MIRABILIS         Radiology Studies: No results found.       Scheduled Meds:  ascorbic acid   500 mg Oral Daily   aspirin   81 mg Oral Daily   atorvastatin   40 mg Oral QHS   clopidogrel   75 mg Oral Q breakfast   divalproex   125 mg Oral BID   docusate sodium   100 mg Oral BID   enoxaparin  (LOVENOX ) injection  40 mg Subcutaneous Q24H   feeding supplement  237 mL Oral TID BM   ferrous gluconate   324 mg Oral Daily   fluticasone  furoate-vilanterol  1 puff Inhalation Daily   gabapentin   100 mg Oral BID    insulin  aspart  0-5 Units Subcutaneous QHS   insulin  aspart  0-9 Units Subcutaneous TID WC   isosorbide  mononitrate  10 mg Oral QHS  lipase/protease/amylase  24,000 Units Oral TID WC   loratadine   10 mg Oral Daily   metoprolol  tartrate  12.5 mg Oral BID   multivitamin with minerals  1 tablet Oral Daily   pantoprazole   40 mg Oral Daily   phosphorus  250 mg Oral Daily   sodium chloride  flush  3 mL Intravenous Q12H   sodium chloride  flush  3 mL Intravenous Q12H   zinc  sulfate (50mg  elemental zinc )  220 mg Oral Daily   Continuous Infusions:  cefTRIAXone  (ROCEPHIN )  IV Stopped (04/22/23 9081)   linezolid  (ZYVOX ) IV 300 mL/hr at 04/22/23 1100   thiamine  (VITAMIN B1) injection 500 mg (04/22/23 1151)     LOS: 3 days    Time spent: 35 minutes    Inola Lisle A Lacharles Altschuler, MD [facility]   If 7PM-7AM, please contact night-coverage [email or web address]  04/22/2023, 4:18 PM

## 2023-04-22 NOTE — TOC Initial Note (Signed)
 Transition of Care [facility]) - Initial/Assessment Note    Patient Details  Name: Justin [name] MRN: [id number] Date of Birth: 15-Jan-1935  [facility] CM/SW Contact:    Isaiah Public, LCSWA Phone Number: 04/22/2023, 11:19 AM  Clinical Narrative:                  CSW received consult for possible SNF placement at time of discharge. Due to patients current orientation CSW spoke with patients daughter [name] regarding PT recommendation of SNF placement at time of discharge. Patients daughter expressed understanding of PT recommendation and is agreeable to SNF placement at time of discharge. Patients daughter informed CSW patient comes from [facility] long term. Patients daughter confirmed plan is for patient to return back to [facility] and receive short term rehab.  No further questions reported at this time. CSW to continue to follow and assist with discharge planning needs.   Expected Discharge Plan: Skilled Nursing Facility Barriers to Discharge: Continued Medical Work up   Patient Goals and CMS Choice     Choice offered to / list presented to : Adult Children (Patients daughter)      Expected Discharge Plan and Services In-house Referral: Clinical Social Work     Living arrangements for the past 2 months: Skilled Nursing Facility                                      Prior Living Arrangements/Services Living arrangements for the past 2 months: Skilled Nursing Facility Lives with:: Facility Resident Patient language and need for interpreter reviewed:: Yes        Need for Family Participation in Patient Care: Yes (Comment) Care giver support system in place?: Yes (comment)   Criminal Activity/Legal Involvement Pertinent to Current Situation/Hospitalization: No - Comment as needed  Activities of Daily Living      Permission Sought/Granted Permission sought to share information with : Case Manager, Magazine Features Editor, Family Supports                 Emotional Assessment       Orientation: : Oriented to Self, Oriented to Place, Oriented to  Time Alcohol  / Substance Use: Not Applicable Psych Involvement: No (comment)  Admission diagnosis:  Altered mental status [R41.82] Encephalopathy, unspecified type [G93.40] AMS (altered mental status) [R41.82] Patient Active Problem List   Diagnosis Date Noted   AMS (altered mental status) 04/19/2023   Gangrene of toe of left foot ([name]) 04/19/2023   Cystitis 04/18/2023   AVM (arteriovenous malformation) of small bowel, acquired with hemorrhage 12/06/2022   Hiatal hernia 12/06/2022   Lower esophageal ring (Schatzki) 12/06/2022   Acute on chronic anemia 12/05/2022   Heme positive stool 12/05/2022   Long term (current) use of antithrombotics/antiplatelets 12/05/2022   GI bleed 12/04/2022   Toxic metabolic encephalopathy 11/07/2022   Acute metabolic encephalopathy 11/06/2022   UTI (urinary tract infection) 11/06/2022   Wound of lower extremity 11/06/2022   GERD without esophagitis 01/26/2022   Osteomyelitis of second toe of right foot ([name]) 01/25/2022   Acute-on-chronic kidney injury ([name])    Altered mental status    PAD (peripheral artery disease) ([name]) 12/09/2021   Balanitis 12/09/2021   Pressure injury of skin 11/25/2021   Diabetic foot ([name]) 11/16/2021   Diabetic foot infection ([name]) 11/15/2021   Intertrigo 11/15/2021   History of CVA (cerebrovascular accident) 05/21/2021   Chronic kidney disease,  stage 3b ([name]) 05/21/2021   Mixed diabetic hyperlipidemia associated with type 2 diabetes mellitus ([name]) 05/21/2021   Polyneuropathy due to type 2 diabetes mellitus ([name]) 05/21/2021   Leukocytosis 05/21/2021   Type 2 diabetes mellitus with stage 3b chronic kidney disease, with long-term current use of insulin  ([name]) 09/29/2015   Arthritis 09/06/2015   Diabetic peripheral neuropathy ([name]) 08/17/2015   OA (osteoarthritis) of knee 08/17/2015   Primary osteoarthritis of  both knees 08/11/2015   Primary osteoarthritis of left hip 08/11/2015   Hammer toe, acquired 09/30/2013   History of colonic polyps 04/15/2013   Chronic pancreatitis ([name]) 02/26/2013   Anemia of chronic disease 02/26/2013   Goals of care, counseling/discussion 02/26/2013   Essential hypertension 12/14/2012   Testosterone  deficiency 10/22/2011   Headache above the eye region 02/22/2011   Coronary artery disease 02/22/2011   PCP:  Marston Junette HERO, MD Pharmacy:  No Pharmacies Listed    Social Drivers of Health (SDOH) Social History: SDOH Screenings   Food Insecurity: No Food Insecurity (12/05/2022)  Housing: Patient Unable To Answer (12/05/2022)  Transportation Needs: No Transportation Needs (12/05/2022)  Utilities: Not At Risk (12/05/2022)  Depression (PHQ2-9): Low Risk  (02/13/2022)  Tobacco Use: Medium Risk (03/06/2023)   SDOH Interventions:     Readmission Risk Interventions    01/26/2022   11:24 AM  Readmission Risk Prevention Plan  Transportation Screening Complete  PCP or Specialist Appt within 3-5 Days Complete  HRI or Home Care Consult Complete  Social Work Consult for Recovery Care Planning/Counseling Complete  Palliative Care Screening Not Applicable  Medication Review Oceanographer) Complete

## 2023-04-22 NOTE — Progress Notes (Signed)
 Nutrition Follow-up  DOCUMENTATION CODES:   Non-severe (moderate) malnutrition in context of chronic illness  INTERVENTION:   -Continue dysphagia III diet, provide feeding assist all meals.  -Continue Ensure Enlive po TID, each supplement provides 350 kcal and 20 grams of protein. -Continue high dose thiamine  protocol per MD, zinc  sulfate, vitamin C , MVI/Minerals-1 Tab daily to support wound healing.   NUTRITION DIAGNOSIS:   Moderate Malnutrition related to chronic illness, decreased appetite as evidenced by meal completion < 50%, energy intake < 75% for > or equal to 3 months, moderate muscle depletion, per patient/family report.  -ongoing  GOAL:   Patient will meet greater than or equal to 90% of their needs  -progressing  MONITOR:   PO intake, Labs, Supplement acceptance, Weight trends, Skin  REASON FOR ASSESSMENT:   Follow up  ASSESSMENT: 89 yo male admitted with AMS, L toe gangrene. PMH includes HTN, HLD, DM-2, stroke w/ residual right hemiparesis, dementia, PVD, R TMA, wheel chair dependent, colonic carcinoma 2015.   Patient informed that he does not want any more surgeries, would like to have his toe monitored as an outpatient basis.  Per daughter at bedside, he was eating better prior to admit, poorly since he has been in the hospital. He is requiring feeding assist. He lives at a SNF where meals are provided. Intakes recorded 10-33% x 3 meals. Per EMR, no significant weight loss identified the past 24 months.   Medications reviewed and include vitamin C  500 mg daily, colace, ferrous gluconate  324 mg daily, novolog  SS 3x daily with meals and at bedtime, creon  24,000 units 3x daily with meals, MVI/Minerals-1Tab daily, PPI, zinc  sulfate 220 mg daily, thiamine  500 mg IV every 24 hrs   Labs: CBG 89-272 past 24 hrs, phosphorus 2.4  NUTRITION - FOCUSED PHYSICAL EXAM:  Flowsheet Row Most Recent Value  Orbital Region No depletion  Upper Arm Region Moderate depletion   Thoracic and Lumbar Region No depletion  Buccal Region No depletion  Temple Region Moderate depletion  Clavicle Bone Region Moderate depletion  Clavicle and Acromion Bone Region Moderate depletion  Scapular Bone Region Moderate depletion  Dorsal Hand Moderate depletion  Patellar Region Moderate depletion  Anterior Thigh Region Moderate depletion  Posterior Calf Region Moderate depletion  Edema (RD Assessment) None  Hair Reviewed  Eyes Reviewed  Mouth Reviewed  Skin Reviewed  Nails Reviewed       Diet Order:   Diet Order             DIET DYS 3 Room service appropriate? Yes; Fluid consistency: Thin  Diet effective now                   EDUCATION NEEDS:   Not appropriate for education at this time  Skin:  Skin Assessment: Skin Integrity Issues: Skin Integrity Issues:: Stage II Stage II: R heel Other: non-pressure wound with small area of necrosis to L great toe  Last BM:  04/22/2023, type 6-medium  Height:   Ht Readings from Last 1 Encounters:  04/18/23 6' 2 (1.88 m)    Weight:   Wt Readings from Last 1 Encounters:  04/18/23 69.8 kg    Ideal Body Weight:  86 kg  BMI:  Body mass index is 19.76 kg/m.  Estimated Nutritional Needs:   Kcal:  2100-2300  Protein:  110-130 gm  Fluid:  2.1-2.3 L    Elveria Sable, RDLD Clinical Dietitian If unable to reach, please contact RD Inpatient secure chat group between 8 am-4  pm daily

## 2023-04-22 NOTE — Inpatient Diabetes Management (Signed)
 Inpatient Diabetes Program Recommendations  AACE/ADA: New Consensus Statement on Inpatient Glycemic Control (2015)  Target Ranges:  Prepandial:   less than 140 mg/dL      Peak postprandial:   less than 180 mg/dL (1-2 hours)      Critically ill patients:  140 - 180 mg/dL   Lab Results  Component Value Date   GLUCAP 272 (H) 04/22/2023   HGBA1C 8.8 (H) 04/18/2023    Review of Glycemic Control  Latest Reference Range & Units 04/21/23 07:41 04/21/23 12:06 04/21/23 16:47 04/21/23 21:13 04/22/23 07:34 04/22/23 11:58 04/22/23 12:00  Glucose-Capillary 70 - 99 mg/dL 878 (H) 815 (H) 864 (H) 89 103 (H) 244 (H) 272 (H)   Diabetes history: DM 2 Outpatient Diabetes medications: Humalog  0-12 units tid, Trulicity 1.5 mg QTuesday, Toujeo  24 units Current orders for Inpatient glycemic control:  Novolog  0-9 units tid + hs  A1c 8.8% on 1/9 Ensure enlive (40 grams of carbohydrate) tid between meals  Inpatient Diabetes Program Recommendations:    Note glucose trends increase after supplement intake.  -  may consider Meal/supplement coverage with Novolog  if trends continue to be elevated postprandially.  Thanks,  Clotilda Bull RN, MSN, BC-ADM Inpatient Diabetes Coordinator Team Pager [phone] (8a-5p)

## 2023-04-22 NOTE — Evaluation (Signed)
 Occupational Therapy Evaluation/Discharge Patient Details Name: Justin [name] MRN: [id number] DOB: November 20, 1934 Today's Date: 04/22/2023   History of Present Illness Pt is an 89 y.o. male who presented 04/17/23 with concerns for worsening slurred speech and R-sided facial droop. MRI brain with no evidence of acute CVA. Admitted for acute metabolic encephalopathy due to urosepsis. 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   Clinical Impression   PTA, pt from [facility] and uses a lift for OOB transfers. Pt typically able to assist with basic feeding/grooming tasks but assisted for all other ADLs bed level. Pt presents now at baseline for ADLs/mobility, able to assist with washing face and self feeding breakfast w/ appropriate command following and baseline memory deficits noted. With no acute surgical plans or changes in status, recommend no further skilled OT services in acute setting at this time.        If plan is discharge home, recommend the following: A lot of help with walking and/or transfers;Two people to help with walking and/or transfers;A lot of help with bathing/dressing/bathroom;Two people to help with bathing/dressing/bathroom    Functional Status Assessment  Patient has not had a recent decline in their functional status  Equipment Recommendations  None recommended by OT    Recommendations for Other Services       Precautions / Restrictions Precautions Precautions: Fall Precaution Comments: prior R TMA Restrictions Weight Bearing Restrictions Per Provider Order: No      Mobility Bed Mobility                    Transfers                          Balance                                           ADL either performed or assessed with clinical judgement   ADL Overall  ADL's : At baseline                                       General ADL Comments: appears at baseline. able to assist with basic UB ADLs such as washing face and self feeding with cues/assist to initiate. Deferred EOB/OOB as pt baseline uses hoyer lift and assisted with all other ADLs bed level     Vision Ability to See in Adequate Light: 0 Adequate Patient Visual Report: No change from baseline Vision Assessment?: No apparent visual deficits     Perception         Praxis         Pertinent Vitals/Pain Pain Assessment Pain Assessment: No/denies pain     Extremity/Trunk Assessment Upper Extremity Assessment Upper Extremity Assessment: Right hand dominant;RUE deficits/detail RUE Deficits / Details: reports hx of CVA and then prior elbow fx/injury that has limited elbow ext. all other PROM [facility]   Lower Extremity Assessment Lower Extremity Assessment: Defer to PT evaluation   Cervical / Trunk Assessment Cervical / Trunk Assessment: Kyphotic   Communication Communication Communication: No apparent difficulties   Cognition Arousal: Alert Behavior During Therapy: Flat affect Overall Cognitive Status: History of cognitive impairments -  at baseline                                 General Comments: Hx of Alzheimer's dementia. able to report staying at rehab facility but unsure how long he has been there. able to provide some correct PLOF info. appears at baseline     General Comments       Exercises     Shoulder Instructions      Home Living Family/patient expects to be discharged to:: Skilled nursing facility                                 Additional Comments: pt from [facility] nursing facility      Prior Functioning/Environment Prior Level of Function : Needs assist;Patient poor historian/Family not available             Mobility Comments: Daughter reports pt is dependent on staff to perform hoyer lift transfers OOB to  w/c, but pt is able to propel the w/c mod I with his UEs, rarely uses his legs ADLs Comments: Daughter reports pt is dependent on staff for bed level ADLs; intermittently needs assistance with feeding as well        OT Problem List:        OT Treatment/Interventions:      OT Goals(Current goals can be found in the care plan section) Acute Rehab OT Goals Patient Stated Goal: have some breakfast, see my wife OT Goal Formulation: All assessment and education complete, DC therapy  OT Frequency:      Co-evaluation              AM-PAC OT 6 Clicks Daily Activity     Outcome Measure Help from another person eating meals?: A Little Help from another person taking care of personal grooming?: A Little Help from another person toileting, which includes using toliet, bedpan, or urinal?: Total Help from another person bathing (including washing, rinsing, drying)?: A Lot Help from another person to put on and taking off regular upper body clothing?: A Lot Help from another person to put on and taking off regular lower body clothing?: Total 6 Click Score: 12   End of Session Nurse Communication: Mobility status;Need for lift equipment  Activity Tolerance: Patient tolerated treatment well Patient left: in bed;with call bell/phone within reach;with bed alarm set  OT Visit Diagnosis: Other abnormalities of gait and mobility (R26.89)                Time: 9162-9142 OT Time Calculation (min): 20 min Charges:  OT General Charges $OT Visit: 1 Visit OT Evaluation $OT Eval Low Complexity: 1 Low  Mliss NOVAK, OTR/L Acute Rehab Services Office: [phone]   Mliss Getting 04/22/2023, 9:06 AM

## 2023-04-22 NOTE — Progress Notes (Signed)
  Progress Note    04/22/2023 1:59 PM   Subjective:  denies pain in his left foot; [name] his daughter is at bedside.   afebrile  Vitals:   04/22/23 0700 04/22/23 0814  BP:  (!) 146/78  Pulse:  72  Resp:  19  Temp:  98.6 F (37 C)  SpO2: 96% 97%    Physical Exam: General:  no distress; tearful at times.  Cardiac:  regular Lungs:  non labored Extremities:  palpable left AT/DP pulse   CBC    Component Value Date/Time   WBC 10.1 04/22/2023 0736   RBC 2.95 (L) 04/22/2023 0736   HGB 9.0 (L) 04/22/2023 0736   HCT 26.6 (L) 04/22/2023 0736   PLT 176 04/22/2023 0736   MCV 90.2 04/22/2023 0736   MCV 89.5 12/31/2013 2117   MCH 30.5 04/22/2023 0736   MCHC 33.8 04/22/2023 0736   RDW 16.3 (H) 04/22/2023 0736   LYMPHSABS 3.5 04/17/2023 2330   MONOABS 0.9 04/17/2023 2330   EOSABS 0.6 (H) 04/17/2023 2330   BASOSABS 0.1 04/17/2023 2330    BMET    Component Value Date/Time   NA 133 (L) 04/22/2023 0736   K 3.6 04/22/2023 0736   CL 101 04/22/2023 0736   CO2 23 04/22/2023 0736   GLUCOSE 118 (H) 04/22/2023 0736   BUN 18 04/22/2023 0736   CREATININE 1.43 (H) 04/22/2023 0736   CREATININE 2.01 (H) 12/20/2021 1434   CALCIUM  8.0 (L) 04/22/2023 0736   GFRNONAA 48 (L) 04/22/2023 0736   GFRNONAA 79 09/15/2015 1117   GFRAA 56 (L) 08/31/2018 2017   GFRAA >89 09/15/2015 1117    INR    Component Value Date/Time   INR 1.1 04/17/2023 2330     Intake/Output Summary (Last 24 hours) at 04/22/2023 1359 Last data filed at 04/22/2023 1100 Gross per 24 hour  Intake 2835.8 ml  Output 400 ml  Net 2435.8 ml      Assessment/Plan:  89 y.o. male is s/p:  Left ATA angioplasty on 04/19/2023 by Dr. Magda   -was asked to speak with family about toe amputation and chances of healing, etc.   -discussed with [name] and [name] (by telephone), pt may have 50/50 chance of healing amputation and if it doesn't heal, he may need more a more proximal amputation.   -Emma and I also discussed  palliative and comfort measures given his medical issues.  They would like to speak with palliative service again to go over goals of care.  I will ask Dr. Magda to call [name] tomorrow for further discussion.   -encouraging that pt does have a palpable left AT/DP pulse.  -Dr. Malvin recommends outpatient follow up with him   Lucie Apt, PA-C [facility] [phone] 04/22/2023 1:59 PM

## 2023-04-22 NOTE — NC FL2 (Signed)
 [place]  MEDICAID FL2 LEVEL OF CARE FORM     IDENTIFICATION  Patient Name: Justin [name] Birthdate: 1934/11/19 Sex: male Admission Date (Current Location): 04/17/2023  [facility] and [place] Number:  Producer, Television/film/video and Address:  The . [facility], [address] [place], [place] [address]      Provider Number: [id number]  Attending Physician Name and Address:  Madelyne Owen LABOR, MD  Relative Name and Phone Number:  [name] (daughter) [phone]    Current Level of Care: Hospital Recommended Level of Care: Skilled Nursing Facility Prior Approval Number:    Date Approved/Denied:   PASRR Number: [id number]  Discharge Plan: SNF    Current Diagnoses: Patient Active Problem List   Diagnosis Date Noted   AMS (altered mental status) 04/19/2023   Gangrene of toe of left foot ([name]) 04/19/2023   Cystitis 04/18/2023   AVM (arteriovenous malformation) of small bowel, acquired with hemorrhage 12/06/2022   Hiatal hernia 12/06/2022   Lower esophageal ring ([place]) 12/06/2022   Acute on chronic anemia 12/05/2022   Heme positive stool 12/05/2022   Long term (current) use of antithrombotics/antiplatelets 12/05/2022   GI bleed 12/04/2022   Toxic metabolic encephalopathy 11/07/2022   Acute metabolic encephalopathy 11/06/2022   UTI (urinary tract infection) 11/06/2022   Wound of lower extremity 11/06/2022   GERD without esophagitis 01/26/2022   Osteomyelitis of second toe of right foot ([name]) 01/25/2022   Acute-on-chronic kidney injury ([name])    Altered mental status    PAD (peripheral artery disease) ([name]) 12/09/2021   Balanitis 12/09/2021   Pressure injury of skin 11/25/2021   Diabetic foot ([name]) 11/16/2021   Diabetic foot infection ([name]) 11/15/2021   Intertrigo 11/15/2021   History of CVA (cerebrovascular accident) 05/21/2021   Chronic kidney disease, stage 3b ([name]) 05/21/2021   Mixed diabetic hyperlipidemia associated with type 2 diabetes mellitus  ([name]) 05/21/2021   Polyneuropathy due to type 2 diabetes mellitus ([name]) 05/21/2021   Leukocytosis 05/21/2021   Type 2 diabetes mellitus with stage 3b chronic kidney disease, with long-term current use of insulin  ([name]) 09/29/2015   Arthritis 09/06/2015   Diabetic peripheral neuropathy ([name]) 08/17/2015   OA (osteoarthritis) of knee 08/17/2015   Primary osteoarthritis of both knees 08/11/2015   Primary osteoarthritis of left hip 08/11/2015   Hammer toe, acquired 09/30/2013   History of colonic polyps 04/15/2013   Chronic pancreatitis ([name]) 02/26/2013   Anemia of chronic disease 02/26/2013   Goals of care, counseling/discussion 02/26/2013   Essential hypertension 12/14/2012   Testosterone  deficiency 10/22/2011   Headache above the eye region 02/22/2011   Coronary artery disease 02/22/2011    Orientation RESPIRATION BLADDER Height & Weight     Self, Place, Time  Normal Incontinent, External catheter (External Urinary Catheter) Weight: 153 lb 14.1 oz (69.8 kg) Height:  6' 2 (188 cm)  BEHAVIORAL SYMPTOMS/MOOD NEUROLOGICAL BOWEL NUTRITION STATUS      Incontinent Diet (Please see discharge summary)  AMBULATORY STATUS COMMUNICATION OF NEEDS Skin   Extensive Assist Verbally Other (Comment) (Abrasion,penis,lower,Ecchymosis,arm,leg,Bil.,Erythema,scrotum,lower,wound/Incision LDAs,PI heel,R,stage 2,PRN,Wound/Incision open or dehisced skin tear,penis,lower,small semicircular skin tear at base of inferior shaft of penis,please see additional info)                       Personal Care Assistance Level of Assistance  Bathing, Feeding, Dressing Bathing Assistance: Maximum assistance Feeding assistance: Limited assistance Dressing Assistance: Maximum assistance     Functional Limitations Info  Sight, Hearing, Speech   Hearing Info:  Adequate Speech Info: Adequate    SPECIAL CARE FACTORS FREQUENCY  PT (By licensed PT), OT (By licensed OT)     PT Frequency: 5x min weekly OT Frequency: 5x  min weekly            Contractures Contractures Info: Not present    Additional Factors Info  Code Status, Allergies, Psychotropic, Insulin  Sliding Scale Code Status Info: DNR Allergies Info: NKA Psychotropic Info: divalproex  (DEPAKOTE  SPRINKLE) capsule 125 mg 2 times daily Insulin  Sliding Scale Info: insulin  aspart (novoLOG ) injection 0-5 Units daily at bedtime,  insulin  aspart (novoLOG ) injection 0-9 Units 3 times daily with meals       Current Medications (04/22/2023):  This is the current hospital active medication list Current Facility-Administered Medications  Medication Dose Route Frequency Provider Last Rate Last Admin   acetaminophen  (TYLENOL ) tablet 650 mg  650 mg Oral Q4H PRN Magda Debby SAILOR, MD   650 mg at 04/22/23 9371   albuterol  (PROVENTIL ) (2.5 MG/3ML) 0.083% nebulizer solution 2.5 mg  2.5 mg Nebulization Q4H PRN Foust, Katy L, NP       ascorbic acid  (VITAMIN C ) tablet 500 mg  500 mg Oral Daily Elgergawy, Dawood S, MD   500 mg at 04/22/23 9163   aspirin  chewable tablet 81 mg  81 mg Oral Daily Foust, Katy L, NP   81 mg at 04/22/23 0836   atorvastatin  (LIPITOR ) tablet 40 mg  40 mg Oral QHS Foust, Katy L, NP   40 mg at 04/21/23 2146   cefTRIAXone  (ROCEPHIN ) 1 g in sodium chloride  0.9 % 100 mL IVPB  1 g Intravenous Daily Foust, Katy L, NP   Stopped at 04/22/23 9081   clopidogrel  (PLAVIX ) tablet 75 mg  75 mg Oral Q breakfast Magda Debby SAILOR, MD   75 mg at 04/22/23 9163   divalproex  (DEPAKOTE  SPRINKLE) capsule 125 mg  125 mg Oral BID Foust, Katy L, NP   125 mg at 04/22/23 0836   docusate sodium  (COLACE) capsule 100 mg  100 mg Oral BID Foust, Katy L, NP   100 mg at 04/21/23 2146   enoxaparin  (LOVENOX ) injection 40 mg  40 mg Subcutaneous Q24H Regalado, Belkys A, MD       feeding supplement (ENSURE ENLIVE / ENSURE PLUS) liquid 237 mL  237 mL Oral TID BM Elgergawy, Dawood S, MD   237 mL at 04/22/23 0838   ferrous gluconate  (FERGON) tablet 324 mg  324 mg Oral Daily Foust, Katy  L, NP   324 mg at 04/22/23 0836   fluticasone  furoate-vilanterol (BREO ELLIPTA ) 100-25 MCG/ACT 1 puff  1 puff Inhalation Daily Regalado, Belkys A, MD   1 puff at 04/22/23 0726   gabapentin  (NEURONTIN ) capsule 100 mg  100 mg Oral BID Foust, Katy L, NP   100 mg at 04/22/23 9164   haloperidol  lactate (HALDOL ) injection 1 mg  1 mg Intravenous Q6H PRN Regalado, Belkys A, MD       hydrALAZINE  (APRESOLINE ) injection 5 mg  5 mg Intravenous Q6H PRN Foust, Katy L, NP       hydrALAZINE  (APRESOLINE ) injection 5 mg  5 mg Intravenous Q20 Min PRN Magda Debby SAILOR, MD       hydrOXYzine  (ATARAX ) tablet 10 mg  10 mg Oral BID PRN Foust, Katy L, NP   10 mg at 04/21/23 0212   insulin  aspart (novoLOG ) injection 0-5 Units  0-5 Units Subcutaneous QHS Elgergawy, Dawood S, MD       insulin  aspart (novoLOG ) injection 0-9  Units  0-9 Units Subcutaneous TID WC Elgergawy, Dawood S, MD   1 Units at 04/21/23 1726   isosorbide  mononitrate (ISMO ) tablet 10 mg  10 mg Oral QHS Elgergawy, Dawood S, MD   10 mg at 04/21/23 2206   labetalol  (NORMODYNE ) injection 10 mg  10 mg Intravenous Q10 min PRN Magda Debby SAILOR, MD       linezolid  (ZYVOX ) IVPB 600 mg  600 mg Intravenous Q12H Regalado, Belkys A, MD 300 mL/hr at 04/22/23 1100 Infusion Verify at 04/22/23 1100   lipase/protease/amylase (CREON ) capsule 24,000 Units  24,000 Units Oral TID WC Elgergawy, Dawood S, MD   24,000 Units at 04/22/23 1156   loratadine  (CLARITIN ) tablet 10 mg  10 mg Oral Daily Foust, Katy L, NP   10 mg at 04/22/23 0836   metoprolol  tartrate (LOPRESSOR ) tablet 12.5 mg  12.5 mg Oral BID Elgergawy, Dawood S, MD   12.5 mg at 04/22/23 9163   multivitamin with minerals tablet 1 tablet  1 tablet Oral Daily Elgergawy, Dawood S, MD   1 tablet at 04/22/23 9163   ondansetron  (ZOFRAN ) injection 4 mg  4 mg Intravenous Q6H PRN Magda Debby SAILOR, MD       pantoprazole  (PROTONIX ) EC tablet 40 mg  40 mg Oral Daily Foust, Katy L, NP   40 mg at 04/22/23 9162   polyethylene glycol  (MIRALAX  / GLYCOLAX ) packet 17 g  17 g Oral Daily PRN Shona Laurence N, DO       sodium chloride  flush (NS) 0.9 % injection 3 mL  3 mL Intravenous Q12H Foust, Katy L, NP   3 mL at 04/22/23 0849   sodium chloride  flush (NS) 0.9 % injection 3 mL  3 mL Intravenous PRN Foust, Katy L, NP       sodium chloride  flush (NS) 0.9 % injection 3 mL  3 mL Intravenous Q12H Magda Debby SAILOR, MD   3 mL at 04/21/23 1023   sodium chloride  flush (NS) 0.9 % injection 3 mL  3 mL Intravenous PRN Magda Debby SAILOR, MD       thiamine  (VITAMIN B1) 500 mg in sodium chloride  0.9 % 50 mL IVPB  500 mg Intravenous Q24H Regalado, Belkys A, MD 110 mL/hr at 04/22/23 1151 500 mg at 04/22/23 1151   zinc  sulfate (50mg  elemental zinc ) capsule 220 mg  220 mg Oral Daily Elgergawy, Dawood S, MD   220 mg at 04/22/23 9163     Discharge Medications: Please see discharge summary for a list of discharge medications.  Relevant Imaging Results:  Relevant Lab Results:   Additional Information [id number] Wound/Incision open or dehisced non-pressure wound toe,anterior,L,necrosis present on tip/pad,Left great toe  Isaiah Public, LCSWA

## 2023-04-22 NOTE — Progress Notes (Signed)
 Unable to complete admission questions, pt is confused, no family at bedside

## 2023-04-22 NOTE — Progress Notes (Signed)
 Brief Palliative Medicine Progress Note:  PMT following peripherally for needs/decline:  Medical records reviewed including progress notes, labs, imaging. No acute changes, improving toward discharge.  Goals are clear to treat the treatable and DNR-Limited. Discharge back to LTC with outpatient Palliative Care to follow.  PMT will continue to follow peripherally. If there are any imminent needs please call the service directly.   Thank you for allowing PMT to assist in the care of this patient.  Phillis Thackeray M. Claudene [facility] Palliative Medicine Team Team Phone: [phone] NO CHARGE

## 2023-04-22 NOTE — Plan of Care (Signed)
  Problem: Clinical Measurements: Goal: Ability to maintain clinical measurements within normal limits will improve Outcome: Progressing   Problem: Coping: Goal: Level of anxiety will decrease Outcome: Completed/Met   Problem: Elimination: Goal: Will not experience complications related to bowel motility Outcome: Completed/Met

## 2023-04-22 NOTE — Progress Notes (Signed)
 Daily Progress Note   Patient Name: Justin [name]       Date: 04/22/2023 DOB: [birth year]  Age: 89 y.o. MRN#: [id number] Attending Physician: Madelyne Owen LABOR, MD Primary Care Physician: Marston Junette HERO, MD Admit Date: 04/17/2023  Reason for Consultation/Follow-up: Establishing goals of care  Subjective: 2:00 PM Received notification that daughter/Justin [name] called requesting return call. Also received updates from VVS, Ortho, and attending regarding previous discussions with family/patient and uncertainty of plan regarding surgery.  I have reviewed medical records including EPIC notes, MAR, any available advanced directives as necessary, and labs. Received report from primary RN - no acute concerns.  Went to visit patient at bedside - daughter/Justin [name] present. Patient was lying in bed awake, alert, oriented to self only, and able to participate in simple conversation. No signs or non-verbal gestures of pain or discomfort noted. No respiratory distress, increased work of breathing, or secretions noted.   Emotional support provided to Justin [name]. Therapeutic listening provided as she reflects on patient's medical history, poor functional status, conversations with her siblings, and conversations with other medical providers regarding patient's current acute situation. She has a clear understanding of patient's current acute situation.  We discussed hospice support at LTC facility per her request. Provided education and counseling at length on the philosophy and benefits of hospice care. Discussed that it offers a holistic approach to care in the setting of end-stage illness, and is about supporting the patient where they are allowing nature to take it's course. Discussed the hospice team includes RNs,  physicians, social workers, and chaplains. They can provide personal care, support for the family, and help keep patient out of the hospital as well as assist with DME needs for home hospice. Justin [name] is hopeful not to pursue surgery and discharge patient back to facility with hospice care.  Justin [name] confirms her brother/Justin [name] is HCPOA.  Called Justin [name] via speakerphone and discussed patient's situation in detail - he also has a clear understanding. Justin [name] was hopeful patient could make his own decisions regarding surgery; however, he is uncertain if patient completely understands information. Reviewed that patient is not able to make complex decisions at this time and medical team would be looking to [facility] for decision making. Justin [name] is hopeful to speak with VVS/Ortho to gain insight into risk/benefits of surgery. If benefits seem low, he would  likely opt for no amputation and patient's discharge back to facility with hospice care. Reviewed hospice information as outlined above. We discussed that not pursuing amputation and discharging with hospice would be very reasonable considering his poor functional and mental status.   Allowed space and time for Justin [name] and Justin [name] to discuss information.  Family are ok with PMT follow up tomorrow after speaking with VVS/Ortho.   All questions and concerns addressed. Encouraged to call with questions and/or concerns. PMT card provided.  Length of Stay: 3  Current Medications: Scheduled Meds:   ascorbic acid   500 mg Oral Daily   aspirin   81 mg Oral Daily   atorvastatin   40 mg Oral QHS   clopidogrel   75 mg Oral Q breakfast   divalproex   125 mg Oral BID   docusate sodium   100 mg Oral BID   enoxaparin  (LOVENOX ) injection  40 mg Subcutaneous Q24H   feeding supplement  237 mL Oral TID BM   ferrous gluconate   324 mg Oral Daily   fluticasone  furoate-vilanterol  1 puff Inhalation Daily   gabapentin   100 mg Oral BID   insulin  aspart  0-5 Units Subcutaneous QHS   insulin  aspart  0-9  Units Subcutaneous TID WC   isosorbide  mononitrate  10 mg Oral QHS   lipase/protease/amylase  24,000 Units Oral TID WC   loratadine   10 mg Oral Daily   metoprolol  tartrate  12.5 mg Oral BID   multivitamin with minerals  1 tablet Oral Daily   pantoprazole   40 mg Oral Daily   sodium chloride  flush  3 mL Intravenous Q12H   sodium chloride  flush  3 mL Intravenous Q12H   zinc  sulfate (50mg  elemental zinc )  220 mg Oral Daily    Continuous Infusions:  cefTRIAXone  (ROCEPHIN )  IV Stopped (04/22/23 9081)   linezolid  (ZYVOX ) IV 300 mL/hr at 04/22/23 1100   thiamine  (VITAMIN B1) injection 500 mg (04/22/23 1151)    PRN Meds: acetaminophen , albuterol , haloperidol  lactate, hydrALAZINE , hydrALAZINE , hydrOXYzine , labetalol , ondansetron  (ZOFRAN ) IV, polyethylene glycol, sodium chloride  flush, sodium chloride  flush  Physical Exam Vitals and nursing note reviewed.  Constitutional:      General: He is not in acute distress.    Appearance: He is ill-appearing.  Pulmonary:     Effort: No respiratory distress.  Skin:    General: Skin is warm and dry.  Neurological:     Mental Status: He is alert. Mental status is at baseline. He is disoriented and confused.     Motor: Weakness present.  Psychiatric:        Attention and Perception: Attention normal.        Behavior: Behavior is cooperative.        Cognition and Memory: Cognition is impaired. Memory is impaired.             Vital Signs: BP (!) 146/78 (BP Location: Left Arm)   Pulse 72   Temp 98.6 F (37 C) (Oral)   Resp 19   Ht 6' 2 (1.88 m)   Wt 69.8 kg   SpO2 97%   BMI 19.76 kg/m  SpO2: SpO2: 97 % O2 Device: O2 Device: Room Air O2 Flow Rate:    Intake/output summary:  Intake/Output Summary (Last 24 hours) at 04/22/2023 1416 Last data filed at 04/22/2023 1100 Gross per 24 hour  Intake 2775.8 ml  Output 400 ml  Net 2375.8 ml   LBM: Last BM Date : 04/22/23 Baseline Weight: Weight: 67 kg Most recent weight: Weight: 69.8 kg  Palliative Assessment/Data: PPS 30%      Patient Active Problem List   Diagnosis Date Noted   AMS (altered mental status) 04/19/2023   Gangrene of toe of left foot ([name]) 04/19/2023   Cystitis 04/18/2023   AVM (arteriovenous malformation) of small bowel, acquired with hemorrhage 12/06/2022   Hiatal hernia 12/06/2022   Lower esophageal ring (Schatzki) 12/06/2022   Acute on chronic anemia 12/05/2022   Heme positive stool 12/05/2022   Long term (current) use of antithrombotics/antiplatelets 12/05/2022   GI bleed 12/04/2022   Toxic metabolic encephalopathy 11/07/2022   Acute metabolic encephalopathy 11/06/2022   UTI (urinary tract infection) 11/06/2022   Wound of lower extremity 11/06/2022   GERD without esophagitis 01/26/2022   Osteomyelitis of second toe of right foot ([name]) 01/25/2022   Acute-on-chronic kidney injury ([name])    Altered mental status    PAD (peripheral artery disease) ([name]) 12/09/2021   Balanitis 12/09/2021   Pressure injury of skin 11/25/2021   Diabetic foot ([name]) 11/16/2021   Diabetic foot infection ([name]) 11/15/2021   Intertrigo 11/15/2021   History of CVA (cerebrovascular accident) 05/21/2021   Chronic kidney disease, stage 3b ([name]) 05/21/2021   Mixed diabetic hyperlipidemia associated with type 2 diabetes mellitus ([name]) 05/21/2021   Polyneuropathy due to type 2 diabetes mellitus ([name]) 05/21/2021   Leukocytosis 05/21/2021   Type 2 diabetes mellitus with stage 3b chronic kidney disease, with long-term current use of insulin  ([name]) 09/29/2015   Arthritis 09/06/2015   Diabetic peripheral neuropathy ([name]) 08/17/2015   OA (osteoarthritis) of knee 08/17/2015   Primary osteoarthritis of both knees 08/11/2015   Primary osteoarthritis of left hip 08/11/2015   Hammer toe, acquired 09/30/2013   History of colonic polyps 04/15/2013   Chronic pancreatitis ([name]) 02/26/2013   Anemia of chronic disease 02/26/2013   Goals of care, counseling/discussion 02/26/2013    Essential hypertension 12/14/2012   Testosterone  deficiency 10/22/2011   Headache above the eye region 02/22/2011   Coronary artery disease 02/22/2011    Palliative Care Assessment & Plan   Patient Profile: 89 y.o. male  with past medical history of DM type II, essential hypertension, hypercholesteremia, iron  deficiency anemia,  chronic pancreatitis, colonic adenocarcinoma in 2015, CKD stage IIIb, history of CVA with residual mild right-sided hemiparesis, TIA, GI Bleed, mixed vascular and alzheimer's dementia, and amputation of (R) toes admitted on 04/17/2023 with AMS. Found to have UTI - MRI and CT negative for acute process. Found to have R great toe dry gangrene. PMT consulted to discuss GOC.   Assessment: Principal Problem:   Acute metabolic encephalopathy Active Problems:   Essential hypertension   Chronic pancreatitis ([name])   Type 2 diabetes mellitus with stage 3b chronic kidney disease, with long-term current use of insulin  ([name])   Chronic kidney disease, stage 3b ([name])   Mixed diabetic hyperlipidemia associated with type 2 diabetes mellitus ([name])   Altered mental status   Cystitis   AMS (altered mental status)   Gangrene of toe of left foot ([name])   Recommendations/Plan: Continue current plan of care Son/Justin [name]/HCPOA would like to discuss risk/benefits of surgery with VVS/Ortho prior to making final decisions. If benefits seem low, he would not pursue surgical intervention and is open for patient's discharge back to facility with hospice PMT will follow up with Justin [name] tomorrow 1/14 after he speaks with VSS/Ortho PMT will continue to follow and support holistically   Goals of Care and Additional Recommendations: Limitations on Scope of Treatment: No Tracheostomy  Code Status:    Code  Status Orders  (From admission, onward)           Start     Ordered   04/18/23 0737  Do not attempt resuscitation (DNR)- Limited -Do Not Intubate (DNI)  Continuous       Question Answer  Comment  If pulseless and not breathing No CPR or chest compressions.   In Pre-Arrest Conditions (Patient Is Breathing and Has A Pulse) Do not intubate. Provide all appropriate non-invasive medical interventions. Avoid ICU transfer unless indicated or required.   Consent: Discussion documented in EHR or advanced directives reviewed      04/18/23 0745           Code Status History     Date Active Date Inactive Code Status Order ID Comments User Context   12/06/2022 0907 12/08/2022 2159 Limited: Do not attempt resuscitation (DNR) -DNR-LIMITED -Do Not Intubate/DNI  [id number]  Trixie Nilda HERO, MD Inpatient   12/04/2022 2256 12/06/2022 0907 Full Code [id number]  Hugelmeyer, Alexis, DO ED   11/06/2022 2147 11/09/2022 2035 DNR [id number]  Ricky Pax T, DO ED   11/06/2022 2116 11/06/2022 2147 Full Code [id number]  Ricky Pax DASEN, DO ED   01/25/2022 1527 01/29/2022 1755 DNR [id number]  Celinda Alm Lot, MD ED   12/10/2021 0018 12/13/2021 0600 DNR [id number]  Lonzell Emeline HERO, DO ED   12/09/2021 2333 12/10/2021 0018 DNR [id number]  Lonzell Emeline HERO, DO ED   11/15/2021 2139 11/28/2021 1812 DNR [id number]  Seena Marsa NOVAK, MD ED   05/21/2021 2144 05/25/2021 2306 DNR [id number]  Kenard Zachary PARAS, MD ED   05/21/2021 2109 05/21/2021 2144 Full Code [id number]  Shalhoub, Zachary PARAS, MD ED       Prognosis:  Unable to determine  Discharge Planning: To Be Determined  Care plan was discussed with primary RN, patient's family, Dr. Malvin, Dr. Madelyne, Lucie Apt, PA/VVS, Dr. Marylouise  Thank you for allowing the Palliative Medicine Team to assist in the care of this patient.   Total Time 50 minutes Prolonged Time Billed  no       Jeoffrey HERO Sharps, NP  Please contact Palliative Medicine Team phone at [phone] for questions and concerns.   *Portions of this note are a verbal dictation therefore any spelling and/or grammatical errors are due to the Dragon Medical One system interpretation.

## 2023-04-23 ENCOUNTER — Encounter (HOSPITAL_BASED_OUTPATIENT_CLINIC_OR_DEPARTMENT_OTHER): Payer: Medicare Other | Admitting: Internal Medicine

## 2023-04-23 DIAGNOSIS — Z515 Encounter for palliative care: Secondary | ICD-10-CM | POA: Diagnosis not present

## 2023-04-23 DIAGNOSIS — G9341 Metabolic encephalopathy: Secondary | ICD-10-CM | POA: Diagnosis not present

## 2023-04-23 DIAGNOSIS — Z711 Person with feared health complaint in whom no diagnosis is made: Secondary | ICD-10-CM

## 2023-04-23 DIAGNOSIS — Z66 Do not resuscitate: Secondary | ICD-10-CM | POA: Diagnosis not present

## 2023-04-23 DIAGNOSIS — E440 Moderate protein-calorie malnutrition: Secondary | ICD-10-CM | POA: Insufficient documentation

## 2023-04-23 DIAGNOSIS — G9340 Encephalopathy, unspecified: Secondary | ICD-10-CM | POA: Diagnosis not present

## 2023-04-23 LAB — CBC
HCT: 24.7 % — ABNORMAL LOW (ref 39.0–52.0)
Hemoglobin: 8.3 g/dL — ABNORMAL LOW (ref 13.0–17.0)
MCH: 30.6 pg (ref 26.0–34.0)
MCHC: 33.6 g/dL (ref 30.0–36.0)
MCV: 91.1 fL (ref 80.0–100.0)
Platelets: 178 10*3/uL (ref 150–400)
RBC: 2.71 MIL/uL — ABNORMAL LOW (ref 4.22–5.81)
RDW: 16.1 % — ABNORMAL HIGH (ref 11.5–15.5)
WBC: 8.7 10*3/uL (ref 4.0–10.5)
nRBC: 0 % (ref 0.0–0.2)

## 2023-04-23 LAB — GLUCOSE, CAPILLARY
Glucose-Capillary: 118 mg/dL — ABNORMAL HIGH (ref 70–99)
Glucose-Capillary: 135 mg/dL — ABNORMAL HIGH (ref 70–99)
Glucose-Capillary: 154 mg/dL — ABNORMAL HIGH (ref 70–99)

## 2023-04-23 LAB — BASIC METABOLIC PANEL
Anion gap: 7 (ref 5–15)
BUN: 19 mg/dL (ref 8–23)
CO2: 23 mmol/L (ref 22–32)
Calcium: 7.8 mg/dL — ABNORMAL LOW (ref 8.9–10.3)
Chloride: 103 mmol/L (ref 98–111)
Creatinine, Ser: 1.43 mg/dL — ABNORMAL HIGH (ref 0.61–1.24)
GFR, Estimated: 48 mL/min — ABNORMAL LOW (ref >60)
Glucose, Bld: 225 mg/dL — ABNORMAL HIGH (ref 70–99)
Potassium: 3.7 mmol/L (ref 3.5–5.1)
Sodium: 133 mmol/L — ABNORMAL LOW (ref 135–145)

## 2023-04-23 LAB — PHOSPHORUS: Phosphorus: 2.8 mg/dL (ref 2.5–4.6)

## 2023-04-23 MED ORDER — HYDROXYZINE HCL 10 MG PO TABS: 10.0000 mg | ORAL_TABLET | Freq: Two times a day (BID) | ORAL | 0 refills | Status: DC | PRN

## 2023-04-23 MED ORDER — ADULT MULTIVITAMIN W/MINERALS CH: 1.0000 | ORAL_TABLET | Freq: Every day | ORAL | 0 refills | Status: DC

## 2023-04-23 MED ORDER — METOPROLOL TARTRATE 25 MG PO TABS: 12.5000 mg | ORAL_TABLET | Freq: Two times a day (BID) | ORAL | 1 refills | Status: DC

## 2023-04-23 MED ORDER — AMOXICILLIN-POT CLAVULANATE 875-125 MG PO TABS
1.0000 | ORAL_TABLET | Freq: Two times a day (BID) | ORAL | Status: DC
  Administered 2023-04-23: 1 via ORAL
  Filled 2023-04-23: qty 1

## 2023-04-23 MED ORDER — CLOPIDOGREL BISULFATE 75 MG PO TABS: 75.0000 mg | ORAL_TABLET | Freq: Every day | ORAL | 3 refills | Status: DC

## 2023-04-23 MED ORDER — ASCORBIC ACID 500 MG PO TABS: 500.0000 mg | ORAL_TABLET | Freq: Every day | ORAL | 0 refills | Status: DC

## 2023-04-23 MED ORDER — ZINC SULFATE 220 (50 ZN) MG PO CAPS: 220.0000 mg | ORAL_CAPSULE | Freq: Every day | ORAL | 0 refills | Status: DC

## 2023-04-23 MED ORDER — AMOXICILLIN-POT CLAVULANATE 875-125 MG PO TABS: 1.0000 | ORAL_TABLET | Freq: Two times a day (BID) | ORAL | 0 refills | Status: AC

## 2023-04-23 NOTE — Plan of Care (Signed)
   Problem: Education: Goal: Knowledge of General Education information will improve Description Including pain rating scale, medication(s)/side effects and non-pharmacologic comfort measures Outcome: Progressing   Problem: Health Behavior/Discharge Planning: Goal: Ability to manage health-related needs will improve Outcome: Progressing

## 2023-04-23 NOTE — Consult Note (Signed)
 [facility] Liaison Consult Note   04/23/2023  Justin [name] 1934/11/23 [id number]   Primary Care Provider:  Marston Junette HERO, MD, is a provider at [facility]   Insurance: Medicare ACO REACH [Medicaid]  Patient was reviewed for 3 hospitalizations in 6 months with noted extreme high risk score for unplanned readmission for barriers to care.  Patient was screened for hospitalization and on behalf of [facility]  Care Coordination to assess for post hospital community care needs.  Patient is from [facility] where patient is currently LTC residen noted per inpatient TOC LCSW notes.   Plan: Will notify [facility] RN that patient is for returning to post facility for rehab however is LTC resident there..  For questions or referrals, please contact:  Richerd Fish, RN, BSN, CCM [place]  [facility] Liaison Direct Dial: [phone] or secure chat Email: [email or web address]

## 2023-04-23 NOTE — TOC Transition Note (Signed)
 Transition of Care [facility]) - Discharge Note   Patient Details  Name: Justin [name] MRN: [id number] Date of Birth: 17-Nov-1934  [facility]) CM/SW Contact:  Isaiah Public, LCSWA Phone Number: 04/23/2023, 1:38 PM   Clinical Narrative:     Patient will DC to: [facility]   Anticipated DC date: 04/23/2023  Family notified: [name]  Transport by [facility]  ?  Per MD patient ready for DC to [facility] with palliative services to follow . RN, patient, patient's family, Cheri with [facility] facility notified of DC. Discharge Summary sent to facility. RN given number for report [phone] RM#[id number] bed A. DC packet on chart. DNR signed by MD attached to patients DC packet.Ambulance transport requested for patient.  CSW signing off.    Final next level of care: Skilled Nursing Facility Barriers to Discharge: No Barriers Identified   Patient Goals and CMS Choice     Choice offered to / list presented to : Adult Children (Patients daughter [name])      Discharge Placement              Patient chooses bed at: [facility] Patient to be transferred to facility by: [facility] Name of family member notified: [name] Patient and family notified of of transfer: 04/23/23  Discharge Plan and Services Additional resources added to the After Visit Summary for   In-house Referral: Clinical Social Work                                   Social Drivers of Health (SDOH) Interventions SDOH Screenings   Food Insecurity: No Food Insecurity (04/22/2023)  Housing: Low Risk  (04/22/2023)  Transportation Needs: No Transportation Needs (04/22/2023)  Utilities: Not At Risk (04/22/2023)  Depression (PHQ2-9): Low Risk  (02/13/2022)  Social Connections: Unknown (04/22/2023)  Tobacco Use: Medium Risk (04/22/2023)     Readmission Risk Interventions    01/26/2022   11:24 AM  Readmission Risk Prevention Plan  Transportation Screening Complete  PCP or Specialist Appt  within 3-5 Days Complete  HRI or Home Care Consult Complete  Social Work Consult for Recovery Care Planning/Counseling Complete  Palliative Care Screening Not Applicable  Medication Review Oceanographer) Complete

## 2023-04-23 NOTE — Progress Notes (Signed)
 Daily Progress Note   Patient Name: Justin [name]       Date: 04/23/2023 DOB: 1934/06/02  Age: 89 y.o. MRN#: [id number] Attending Physician: Madelyne Owen LABOR, MD Primary Care Physician: Marston Junette HERO, MD Admit Date: 04/17/2023  Reason for Consultation/Follow-up: Establishing goals of care  Subjective: I have reviewed medical records including EPIC notes, MAR, any available advanced directives as necessary, and labs. Received report from primary RN - no acute concerns.  Went to visit patient at bedside - no family/visitors present. Patient was lying in bed asleep - I did not attempt to wake to preserve comfort. No signs or non-verbal gestures of pain or discomfort noted. No respiratory distress, increased work of breathing, or secretions noted.   Discussed case with Dr. Madelyne, Dr. Malvin, and Dr. Marylouise. Dr. Malvin and Dr. Marylouise have both spoken with HCPOA/[name] - after discussions, plan is for continued conservative management with antibiotics and outpatient follow up. Will monitor and make stepwise decisions regarding surgery pending patient's clinical course. Outpatient Palliative Care will follow.  Confirmed with [facility] liaison referral has been accepted. They will follow up with patient sooner than later outpatient. They can switch patient to hospice services if/when ready.  12:00 PM Attempted to call son/HCPOA/[name] - no answer - confidential voicemail left with encouragement to call with any needs. PMT number provided.   Length of Stay: 4  Current Medications: Scheduled Meds:   amoxicillin -clavulanate  1 tablet Oral Q12H   ascorbic acid   500 mg Oral Daily   aspirin   81 mg Oral Daily   atorvastatin   40 mg Oral QHS   clopidogrel   75 mg Oral Q breakfast    divalproex   125 mg Oral BID   docusate sodium   100 mg Oral BID   enoxaparin  (LOVENOX ) injection  40 mg Subcutaneous Q24H   feeding supplement  237 mL Oral TID BM   ferrous gluconate   324 mg Oral Daily   fluticasone  furoate-vilanterol  1 puff Inhalation Daily   gabapentin   100 mg Oral BID   insulin  aspart  0-5 Units Subcutaneous QHS   insulin  aspart  0-9 Units Subcutaneous TID WC   isosorbide  mononitrate  10 mg Oral QHS   lipase/protease/amylase  24,000 Units Oral TID WC   loratadine   10 mg Oral Daily   metoprolol  tartrate  12.5 mg Oral BID   multivitamin with minerals  1 tablet Oral Daily   pantoprazole   40 mg Oral Daily   sodium chloride  flush  3 mL Intravenous Q12H   sodium chloride  flush  3 mL Intravenous Q12H   zinc  sulfate (50mg  elemental zinc )  220 mg Oral Daily    Continuous Infusions:  thiamine  (VITAMIN B1) injection 500 mg (04/22/23 1151)    PRN Meds: acetaminophen , albuterol , haloperidol  lactate, hydrALAZINE , hydrALAZINE , hydrOXYzine , labetalol , ondansetron  (ZOFRAN ) IV, polyethylene glycol, sodium chloride  flush, sodium chloride  flush  Physical Exam Vitals and nursing note reviewed.  Constitutional:      General: He is not in acute distress.    Appearance: He is ill-appearing.  Pulmonary:     Effort: No respiratory distress.  Skin:    General: Skin is warm and dry.             Vital Signs: BP (!) 147/69 (BP Location: Left Arm)   Pulse 66   Temp 98.2 F (36.8 C) (Oral)   Resp 16   Ht 6' 2 (1.88 m)   Wt 69.8 kg   SpO2 95%   BMI 19.76 kg/m  SpO2: SpO2: 95 % O2 Device: O2 Device: Room Air O2 Flow Rate:    Intake/output summary:  Intake/Output Summary (Last 24 hours) at 04/23/2023 1205 Last data filed at 04/22/2023 1642 Gross per 24 hour  Intake --  Output 400 ml  Net -400 ml   LBM: Last BM Date : 04/22/23 Baseline Weight: Weight: 67 kg Most recent weight: Weight: 69.8 kg       Palliative Assessment/Data: PPS 30%      Patient Active  Problem List   Diagnosis Date Noted   Malnutrition of moderate degree 04/23/2023   AMS (altered mental status) 04/19/2023   Gangrene of toe of left foot ([name]) 04/19/2023   Cystitis 04/18/2023   AVM (arteriovenous malformation) of small bowel, acquired with hemorrhage 12/06/2022   Hiatal hernia 12/06/2022   Lower esophageal ring ([place]) 12/06/2022   Acute on chronic anemia 12/05/2022   Heme positive stool 12/05/2022   Long term (current) use of antithrombotics/antiplatelets 12/05/2022   GI bleed 12/04/2022   Toxic metabolic encephalopathy 11/07/2022   Acute metabolic encephalopathy 11/06/2022   UTI (urinary tract infection) 11/06/2022   Wound of lower extremity 11/06/2022   GERD without esophagitis 01/26/2022   Osteomyelitis of second toe of right foot ([name]) 01/25/2022   Acute-on-chronic kidney injury ([name])    Altered mental status    PAD (peripheral artery disease) ([name]) 12/09/2021   Balanitis 12/09/2021   Pressure injury of skin 11/25/2021   Diabetic foot ([name]) 11/16/2021   Diabetic foot infection ([name]) 11/15/2021   Intertrigo 11/15/2021   History of CVA (cerebrovascular accident) 05/21/2021   Chronic kidney disease, stage 3b ([name]) 05/21/2021   Mixed diabetic hyperlipidemia associated with type 2 diabetes mellitus ([name]) 05/21/2021   Polyneuropathy due to type 2 diabetes mellitus ([name]) 05/21/2021   Leukocytosis 05/21/2021   Type 2 diabetes mellitus with stage 3b chronic kidney disease, with long-term current use of insulin  ([name]) 09/29/2015   Arthritis 09/06/2015   Diabetic peripheral neuropathy ([name]) 08/17/2015   OA (osteoarthritis) of knee 08/17/2015   Primary osteoarthritis of both knees 08/11/2015   Primary osteoarthritis of left hip 08/11/2015   Hammer toe, acquired 09/30/2013   History of colonic polyps 04/15/2013   Chronic pancreatitis ([name]) 02/26/2013   Anemia of chronic disease 02/26/2013   Goals of care, counseling/discussion 02/26/2013   Essential hypertension  12/14/2012   Testosterone  deficiency 10/22/2011   Headache above the eye region 02/22/2011   Coronary artery disease 02/22/2011    Palliative Care Assessment & Plan   Patient Profile: 89 y.o. male with past medical history of DM type II, essential hypertension, hypercholesteremia, iron  deficiency anemia, chronic pancreatitis, colonic adenocarcinoma in 2015, CKD stage IIIb, history of CVA with residual mild right-sided hemiparesis, TIA, GI Bleed, mixed vascular and alzheimer's dementia, and amputation of (R) toes admitted on 04/17/2023 with AMS. Found to have UTI - MRI and CT negative for acute process. Found to have R great toe dry gangrene. PMT consulted to discuss GOC.   Assessment: Principal Problem:   Acute metabolic encephalopathy Active Problems:   Essential hypertension   Chronic pancreatitis ([name])   Type 2 diabetes mellitus with stage 3b chronic kidney disease, with long-term current use of insulin  ([name])   Chronic kidney disease, stage 3b ([name])   Mixed diabetic hyperlipidemia associated with type 2 diabetes mellitus ([name])   Altered mental status   Cystitis   AMS (altered mental status)   Gangrene of toe of left foot ([name])   Malnutrition of moderate degree   Concern about end of life  Recommendations/Plan: Plan for discharge today back to [facility] with outpatient Palliative Care to follow. [facility] [place] [facility] referral and will follow up with patient sooner than later after discharge Continue DNR-Limited - durable DNR form completed and placed in shadow chart. Copy was made and will be scanned into Vynca/ACP tab Per discussions with Podiatry and VSS, plan is to continue conservative management with watchful waiting. Family will make stepwise decisions regarding surgical intervention pending his clinical course PMT will continue to follow peripherally. If there are any imminent needs please call the service directly  Goals of Care and Additional  Recommendations: Limitations on Scope of Treatment: No Artificial Feeding and No Tracheostomy  Code Status:    Code Status Orders  (From admission, onward)           Start     Ordered   04/18/23 0737  Do not attempt resuscitation (DNR)- Limited -Do Not Intubate (DNI)  Continuous       Question Answer Comment  If pulseless and not breathing No CPR or chest compressions.   In Pre-Arrest Conditions (Patient Is Breathing and Has A Pulse) Do not intubate. Provide all appropriate non-invasive medical interventions. Avoid ICU transfer unless indicated or required.   Consent: Discussion documented in EHR or advanced directives reviewed      04/18/23 0745           Code Status History     Date Active Date Inactive Code Status Order ID Comments User Context   12/06/2022 0907 12/08/2022 2159 Limited: Do not attempt resuscitation (DNR) -DNR-LIMITED -Do Not Intubate/DNI  [id number]  Trixie Nilda HERO, MD Inpatient   12/04/2022 2256 12/06/2022 0907 Full Code [id number]  Hugelmeyer, Alexis, DO ED   11/06/2022 2147 11/09/2022 2035 DNR [id number]  Ricky Alfrieda DASEN, DO ED   11/06/2022 2116 11/06/2022 2147 Full Code [id number]  Ricky Alfrieda DASEN, DO ED   01/25/2022 1527 01/29/2022 1755 DNR [id number]  Celinda Alm Lot, MD ED   12/10/2021 0018 12/13/2021 0600 DNR [id number]  Lonzell Emeline HERO, DO ED   12/09/2021 2333 12/10/2021 0018 DNR [id number]  Lonzell Emeline HERO, DO ED   11/15/2021 2139 11/28/2021 1812 DNR [id number]  Seena Marsa NOVAK, MD ED   05/21/2021 2144 05/25/2021 2306 DNR [id number]  Shalhoub, Zachary PARAS, MD  ED   05/21/2021 2109 05/21/2021 2144 Full Code [id number]  Kenard Zachary PARAS, MD ED      Advance Directive Documentation    Flowsheet Row Most Recent Value  Type of Advance Directive Healthcare Power of Attorney  Pre-existing out of facility DNR order (yellow form or pink MOST form) --  MOST Form in Place? --       Prognosis:  Unable to determine  Discharge Planning: Skilled Nursing Facility for rehab  with Palliative care service follow-up  Care plan was discussed with primary RN, Dr. Madelyne, Dr. Malvin, Dr. Marylouise, [facility] liaison  Thank you for allowing the Palliative Medicine Team to assist in the care of this patient.   Total Time 35 minutes Prolonged Time Billed  no       Jeoffrey CHRISTELLA Sharps, NP  Please contact Palliative Medicine Team phone at [phone] for questions and concerns.   *Portions of this note are a verbal dictation therefore any spelling and/or grammatical errors are due to the Dragon [employer] One system interpretation.

## 2023-04-23 NOTE — Discharge Summary (Signed)
 Physician Discharge Summary   Patient: Justin [name] MRN: [id number] DOB: 04/25/1934  Admit date:     04/17/2023  Discharge date: 04/23/23  Discharge Physician: Owen DELENA Lore   PCP: Marston Junette HERO, MD   Recommendations at discharge:   Follow up by Palliative care.  Follow up by DR Standiford as needed.   Discharge Diagnoses: Principal Problem:   Acute metabolic encephalopathy Active Problems:   Chronic kidney disease, stage 3b ([name])   Type 2 diabetes mellitus with stage 3b chronic kidney disease, with long-term current use of insulin  ([name])   Chronic pancreatitis ([name])   Essential hypertension   Mixed diabetic hyperlipidemia associated with type 2 diabetes mellitus ([name])   Altered mental status   Cystitis   AMS (altered mental status)   Gangrene of toe of left foot ([name])   Malnutrition of moderate degree  Resolved Problems:   * No resolved hospital problems. *  Hospital Course: 89 year old with past medical history significant for diabetes type 2, essential hypertension, hypercholesterolemia, iron  deficiency anemia, chronic pancreatitis, colonic adenocarcinoma 2015, CKD stage IIIb, history of CVA with residual right-sided hemiparesis, TIA, GI bleed, mixed vascular and Alzheimer's dementia, left TMA, resident at his SNF for the last 4 years, wheelchair dependent presented from facility due to altered mental status, concern for facial droop, patient was more awake and conversant upon evaluation, but he does appear he is with advanced dementia.  In the ED MRI brain rule out acute CVA.  He was noted to have right great toe dry gangrene wound, workup was significant for UTI.   Vascular consulted, underwent ultrasound guided right common femoral artery, abdominal aortogram, left lower extremity angiogram, left lower extremity angiogram, left anterior tibial angioplasty 3 x 150 mL in Sterling.  Assessment and Plan: 1-Acute metabolic encephalopathy -In the setting of acute  infectious process, Secondary to UTI -MRI brain negative for acute CVA. -Patient lethargic this morning after he received Ativan  for MRI and oxycodone  for pain last night Started  IV thiamine -change to oral.  He is more alert, denies pain  Stable.    Urinary tract infection Completed  5 days of IV ceftriaxone , urine culture growing Proteus   Left hallux gangrene, tuft of first Distal Phalanx Osteomyelitis.  PAD: -Patient with known peripheral vascular disease, he has great toe with dry gangrene.  -MRI: Cortical irregularity of the tuft of the first distal phalanx with marrow edema concerning for osteomyelitis.  No drainable fluid collection to suggest abscess.  Marrow edema in the medial hallux sesamoid as can be seen with sesamoiditis versus mild arthritic changes.  Moderate osteoarthritis. -Underwent angiogram by vascular on 1/10: See report above patient was treated with left anterior tibial angioplasty (3x152mm Sterling)  -Continue IV ceftriaxone  will added linezolid  -Patient declined surgery, and family as well. -Palliative care consulted for goals of care Continue Plavix  and aspirin  and a statin Discussed with ID, we can use Augmentin  at discharge.  Family wanted to speak with Vascular and Podiatry again about Surgery, patient now said he would want sx. Vascular , podiatry informed.  --family have decided against sx. Dr Malvin call Son, and plan is for conservative approach, oral antibiotics, and follow up out patient. Will do 3 weeks Augmentin .    Episode of NSVT No further episode.  Replete Mg Started onlow dose metoprolol .   Hypomagnesemia:  -Replaced.    CKD 3B: Creatinine increased to 1.8.  Plan to continue with IV fluids Previous Cr baseline 1.5--1.6 Cr down to 1.4 Stable.  Hyperlipidemia -Continue with Lipitor    Hypertension Continue with Imdur    History of COPD -continue inhalers.    History of Chronic Pancreatitis - Continue home Creon    History of  diabetes Neuropathy - hold repaglinide - Continue gabapentin  -Sliding scale insulin  -Hold long-acting insulin  unclear if patient is going to be eating enough today Hypophosphatemia; Replaced.    See wound care documentation below     Pressure Injury 11/24/21 Heel Right Stage 2 -  Partial thickness loss of dermis presenting as a shallow open injury with a red, pink wound bed without slough. (Active)  11/24/21 2200  Location: Heel  Location Orientation: Right  Staging: Stage 2 -  Partial thickness loss of dermis presenting as a shallow open injury with a red, pink wound bed without slough.  Wound Description (Comments):   Present on Admission:   Dressing Type Foam - Lift dressing to assess site every shift 04/22/23 9185        Nutrition Problem: Increased nutrient needs Etiology: wound healing       Signs/Symptoms: estimated needs       Interventions: MVI, Ensure Enlive (each supplement provides 350kcal and 20 grams of protein)   Estimated body mass index is 19.76 kg/m as calculated from the following:   Height as of this encounter: 6' 2 (1.88 m).   Weight as of this encounter: 69.8 kg.            Consultants: Vascular, Podiatry  Procedures performed: Arteriogram  Disposition: Long term care facility Diet recommendation:  Discharge Diet Orders (From admission, onward)     Start     Ordered   04/23/23 0000  Diet - low sodium heart healthy        04/23/23 1124           Carb modified diet DISCHARGE MEDICATION: Allergies as of 04/23/2023   No Known Allergies      Medication List     STOP taking these medications    doxycycline  100 MG tablet Commonly known as: VIBRA -TABS   Dulaglutide 1.5 MG/0.5ML Soaj   Toujeo  Max SoloStar 300 UNIT/ML Solostar Pen Generic drug: insulin  glargine (2 Unit Dial)       TAKE these medications    acetaminophen  325 MG tablet Commonly known as: TYLENOL  Take 650 mg by mouth daily.   amoxicillin -clavulanate  875-125 MG tablet Commonly known as: AUGMENTIN  Take 1 tablet by mouth every 12 (twelve) hours for 21 days.   ascorbic acid  500 MG tablet Commonly known as: VITAMIN C  Take 1 tablet (500 mg total) by mouth daily. Start taking on: April 24, 2023   aspirin  81 MG chewable tablet Chew 81 mg by mouth daily.   atorvastatin  40 MG tablet Commonly known as: LIPITOR  Take 40 mg by mouth at bedtime.   Breo Ellipta  100-25 MCG/ACT Aepb Generic drug: fluticasone  furoate-vilanterol Inhale 1 puff into the lungs daily.   clopidogrel  75 MG tablet Commonly known as: PLAVIX  Take 1 tablet (75 mg total) by mouth daily with breakfast. Start taking on: April 24, 2023   Creon  24000-76000 units Cpep Generic drug: Pancrelipase  (Lip-Prot-Amyl) Take 1 capsule by mouth 3 (three) times daily. With 4-6 ounces of water   divalproex  125 MG capsule Commonly known as: DEPAKOTE  SPRINKLE Take 125 mg by mouth 2 (two) times daily.   docusate sodium  100 MG capsule Commonly known as: COLACE Take 100 mg by mouth 2 (two) times daily.   ferrous gluconate  324 MG tablet Commonly known as: FERGON Take 324 mg  by mouth daily.   gabapentin  100 MG capsule Commonly known as: NEURONTIN  Take 100 mg by mouth 2 (two) times daily.   glucagon  1 MG injection Inject 1 mg into the muscle once as needed. What changed: reasons to take this   HumaLOG  100 UNIT/ML injection Generic drug: insulin  lispro Inject 0-12 Units into the skin 3 (three) times daily before meals. BS 70-200 give 0 units  BS 201-250 give 2 units  BS 251-300 give 4 units  BS 301-350 give 6 units  BS 351-400 give 8 units  BS 401-450 give 10 units  BS 451-600 give 12 units   hydrOXYzine  10 MG tablet Commonly known as: ATARAX  Take 1 tablet (10 mg total) by mouth 2 (two) times daily as needed for anxiety or itching. What changed:  when to take this reasons to take this additional instructions   Ipratropium-Albuterol  20-100 MCG/ACT Aers  respimat Commonly known as: COMBIVENT Inhale 1 puff into the lungs every 6 (six) hours as needed for wheezing. What changed: reasons to take this   isosorbide  mononitrate 10 MG tablet Commonly known as: ISMO  Take 10 mg by mouth at bedtime.   loratadine  10 MG tablet Commonly known as: CLARITIN  Take 10 mg by mouth daily.   metoprolol  tartrate 25 MG tablet Commonly known as: LOPRESSOR  Take 0.5 tablets (12.5 mg total) by mouth 2 (two) times daily.   MINERIN CREME EX Apply 1 Application topically at bedtime. Both legs and feet.   multivitamin with minerals Tabs tablet Take 1 tablet by mouth daily. Start taking on: April 24, 2023   Nasal Mist 0.9 % Aers Place 2 sprays into the nose in the morning and at bedtime.   oxyCODONE  5 MG immediate release tablet Commonly known as: Oxy IR/ROXICODONE  Take 5 mg by mouth every 8 (eight) hours as needed for severe pain (pain score 7-10).   oxymetazoline  0.05 % nasal spray Commonly known as: AFRIN Place 2 sprays into both nostrils 2 (two) times daily as needed for congestion.   pantoprazole  40 MG tablet Commonly known as: Protonix  Take 1 tablet (40 mg total) by mouth daily.   PROSTAT PO Take 30 mLs by mouth 3 (three) times daily. 17-100g-kcal/30 ml   sertraline  25 MG tablet Commonly known as: ZOLOFT  Take 25 mg by mouth daily.   Spacer/Aero-Holding [employer] Use as directed with combivent. Ok to dispense any spacer that works with the inhaler being rxed to pt.   zinc  sulfate (50mg  elemental zinc ) 220 (50 Zn) MG capsule Take 1 capsule (220 mg total) by mouth daily. Start taking on: April 24, 2023               Discharge Care Instructions  (From admission, onward)           Start     Ordered   04/23/23 0000  Discharge wound care:       Comments: See above   04/23/23 1124            Discharge Exam: Filed Weights   04/18/23 0735 04/18/23 2100  Weight: 67 kg 69.8 kg   General; NAD  Condition at  discharge: poor  The results of significant diagnostics from this hospitalization (including imaging, microbiology, ancillary and laboratory) are listed below for reference.   Imaging Studies: MR FOOT LEFT WO CONTRAST Result Date: 04/20/2023 CLINICAL DATA:  Left foot abnormality.  Evaluate for osteomyelitis. EXAM: MRI OF THE LEFT FOOT WITHOUT CONTRAST TECHNIQUE: Multiplanar, multisequence MR imaging of the left foot was  performed. No intravenous contrast was administered. COMPARISON:  Left foot 04/19/2023 FINDINGS: Bones/Joint/Cartilage No fracture or dislocation. Normal alignment. No joint effusion. Moderate osteoarthritis of the first MTP joint. Mild osteoarthritis of the first TMT joint. Mild osteoarthritis of the talonavicular joint. Marrow edema in the medial hallux sesamoid as can be seen with sesamoiditis versus mild arthritic changes. Cortical irregularity of the tuft of the first distal phalanx with marrow edema concerning for osteomyelitis. Ligaments Collateral ligaments are intact.  Lisfranc ligament is intact. Muscles and Tendons Flexor, peroneal and extensor compartment tendons are intact. T2 hyperintense signal throughout the plantar musculature likely neurogenic. Mild generalized muscle atrophy. Soft tissue No fluid collection or hematoma.  No soft tissue mass. IMPRESSION: 1. Cortical irregularity of the tuft of the first distal phalanx with marrow edema concerning for osteomyelitis. No drainable fluid collection to suggest an abscess. 2. Marrow edema in the medial hallux sesamoid as can be seen with sesamoiditis versus mild arthritic changes. 3. Moderate osteoarthritis of the first MTP joint. Mild osteoarthritis of the first TMT joint. Mild osteoarthritis of the talonavicular joint. Electronically Signed   By: Julaine Blanch M.D.   On: 04/20/2023 09:50   DG Foot Complete Left Result Date: 04/19/2023 CLINICAL DATA:  Osteomyelitis. EXAM: LEFT FOOT - COMPLETE 3+ VIEW COMPARISON:  04/12/2010.  FINDINGS: There is diffuse osteopenia of the visualized osseous structures. No acute fracture or dislocation. No aggressive osseous lesion. There is pseudoarthrosis of the fifth metatarsophalangeal joint. Calcaneal spur noted along the Achilles tendon attachment site. Diffuse mild degenerative changes of imaged joints. No focal soft tissue swelling. No evidence of focal soft tissue defect or air within the soft tissue. No radiopaque foreign bodies. IMPRESSION: *Osteopenia.  No radiographic evidence of osteomyelitis. Electronically Signed   By: Ree Molt M.D.   On: 04/19/2023 17:10   PERIPHERAL VASCULAR CATHETERIZATION Result Date: 04/19/2023 Table formatting from the original result was not included. DATE OF SERVICE: 04/19/2023  PATIENT:  Justin [name]  89 y.o. male  PRE-OPERATIVE DIAGNOSIS:  Atherosclerosis of native arteries of left lower extremity causing gangrene  POST-OPERATIVE DIAGNOSIS:  Same  PROCEDURE:  1) Ultrasound guided right common femoral artery access 2) Abdominal aortogram 3) Left lower extremity angiogram with second order cannulation 4) Additional left lower extremity angiogram with third order cannulation 5) Left anterior tibial angioplasty (3x130mm Sterling)  CPT: 23062, 24374, 63753, 63752, 62771,  SURGEON:  Debby SAILOR. Magda, MD  ASSISTANT: none  ANESTHESIA:   local  ESTIMATED BLOOD LOSS: minimal  LOCAL MEDICATIONS USED:  LIDOCAINE   COUNTS: confirmed correct.  PATIENT DISPOSITION:  PACU - hemodynamically stable.  Delay start of Pharmacological VTE agent (>24hrs) due to surgical blood loss or risk of bleeding: no  INDICATION FOR PROCEDURE: Justin [name] is a 89 y.o. male with left foot gangrene in setting of peripheral arterial disease. After careful discussion of risks, benefits, and alternatives the patient was offered angiogram. The patient understood and wished to proceed.  OPERATIVE FINDINGS:  Left renal artery: not seen Right renal artery: patent Infrarenal aorta: patent Left  common iliac artery: patent Right common iliac artery: patent Left internal iliac artery: patent Right internal iliac artery: patent Left external iliac artery: patent Right external iliac artery: patent Left common femoral artery: patent Right common femoral artery: not studied Left profunda femoris artery: patent Right profunda femoris artery: not studied Left superficial femoral artery: patent Right superficial femoral artery: not studied Left popliteal artery: patent Right popliteal artery: not studied Left anterior tibial artery: severe  stenosis in proximal 2/3 of vessel; fills the foot via DP Right anterior tibial artery: not studied Left tibioperoneal trunk: occluded Right tibioperoneal trunk: not studied Left peroneal artery: occluded Right peroneal artery: not studied Left posterior tibial artery: occluded Right posterior tibial artery: not studied Left pedal circulation: disadvantaged Right pedal circulation: not studied  GLASS score. FP 0. IP 3. Stage II  WIfI score. 2 / 3 / 0. Stage IV.  DESCRIPTION OF PROCEDURE: After identification of the patient in the pre-operative holding area, the patient was transferred to the operating room. The patient was positioned supine on the operating room table. Anesthesia was induced. The groins was prepped and draped in standard fashion. A surgical pause was performed confirming correct patient, procedure, and operative location.  The right groin was anesthetized with subcutaneous injection of 1% lidocaine . Using ultrasound guidance, the right common femoral artery was accessed with micropuncture technique. Fluoroscopy was used to confirm cannulation over the femoral head. The 2F micropuncture sheath was upsized to 66F.  A Benson wire was advanced into the distal aorta. Over the wire an omni flush catheter was advanced to the level of L2. Aortogram was performed - see above for details.  The left common iliac artery was selected with an omniflush catheter and glidewire  guidewire. The wire was advanced into the common femoral artery. Over the wire the omni flush catheter was advanced into the external iliac artery. Selective angiography was performed - see above for details.  The decision was made to intervene. The patient was heparinized with 7,000 units of heparin . The 66F sheath was exchanged for a 66F x 65cm sheath. Selective angiography of the left lower extremity with a catheter in the popliteal artery was performed prior to intervention.  The lesions were treated with: Left anterior tibial angioplasty (3x183mm Sterling)  Completion angiography revealed: Resolution of anterior tibial stenosis  The sheath was left in place to be removed in the recovery area.  Upon completion of the case instrument and sharps counts were confirmed correct. The patient was transferred to the PACU in good condition. I was present for all portions of the procedure.  PLAN: ASA / Plavix  / Statin. Pedal flow disadvantaged, so may not heal a toe amputation despite revascularization. He is optimized from a vascular standpoint.  Debby SAILOR. Magda, MD [facility] Phone Number: [phone] 04/19/2023 11:36 AM   MR BRAIN WO CONTRAST Result Date: 04/18/2023 CLINICAL DATA:  Neuro deficit, acute, stroke suspected. Left facial droop. Dysarthria. Past medical history of acute metabolic encephalopathy. EXAM: MRI HEAD WITHOUT CONTRAST TECHNIQUE: Multiplanar, multiecho pulse sequences of the brain and surrounding structures were obtained without intravenous contrast. COMPARISON:  CT head without contrast 04/17/2023. MR head without contrast 01/17/2023 FINDINGS: Brain: Advanced atrophy and diffuse white matter disease is again noted. No acute infarct or hemorrhage is present. No mass lesion is present. Dilated perivascular spaces are present within the basal ganglia bilaterally. Remote lacunar infarcts are present in the cerebellum bilaterally. Marked thinning of the  corpus callosum is present. The ventricles are proportionate to the degree of atrophy. No significant extraaxial fluid collection is present. The internal auditory canals are within normal limits. Vascular: Flow is present in the major intracranial arteries. Skull and upper cervical spine: The craniocervical junction is normal. Upper cervical spine is within normal limits. Marrow signal is unremarkable. Sinuses/Orbits: A fluid level is present the right maxillary sinus. Mucosal thickening is present in the ethmoid air cells bilaterally. The  paranasal sinuses and mastoid air cells are otherwise clear. A right lens replacement is present. Globes and orbits are within normal limits bilaterally. IMPRESSION: 1. No acute intracranial abnormality or significant interval change. 2. Advanced atrophy and diffuse white matter disease likely reflects the sequela of chronic microvascular ischemia. 3. Remote lacunar infarcts of the cerebellum bilaterally. 4. Fluid level in the right maxillary sinus may reflect acute sinusitis. Electronically Signed   By: Lonni Necessary M.D.   On: 04/18/2023 14:06   CT HEAD CODE STROKE WO CONTRAST Result Date: 04/17/2023 CLINICAL DATA:  Code stroke. Initial evaluation for acute neuro deficit, left facial droop. EXAM: CT HEAD WITHOUT CONTRAST TECHNIQUE: Contiguous axial images were obtained from the base of the skull through the vertex without intravenous contrast. RADIATION DOSE REDUCTION: This exam was performed according to the departmental dose-optimization program which includes automated exposure control, adjustment of the mA and/or kV according to patient size and/or use of iterative reconstruction technique. COMPARISON:  MRI from 01/17/2023. FINDINGS: Brain: Advanced age-related cerebral atrophy with chronic small vessel ischemic disease. Few scattered remote cerebellar infarcts noted. No acute intracranial hemorrhage. No acute large vessel territory infarct. No mass lesion or  midline shift. Mild ventricular prominence related global parenchymal volume loss of hydrocephalus. No extra-axial fluid collection. Vascular: No abnormal hyperdense vessel. Calcified atherosclerosis present about the skull base. Skull: Scalp soft tissues within normal limits.  Calvarium intact. Sinuses/Orbits: Globes orbital soft tissues demonstrate no acute finding. Ocular senescent calcifications noted. Mild to moderate mucosal thickening present about the sphenoethmoidal and maxillary sinuses. Few superimposed air-fluid levels noted. Mastoid air cells are clear. Other: None. ASPECTS [facility] Stroke Program Early CT Score) - Ganglionic level infarction (caudate, lentiform nuclei, internal capsule, insula, M1-M3 cortex): 7 - Supraganglionic infarction (M4-M6 cortex): 3 Total score (0-10 with 10 being normal): 10 IMPRESSION: 1. No acute intracranial abnormality. 2. ASPECTS is 10. 3. Advanced age-related cerebral atrophy with chronic small vessel ischemic disease. Few scatter remote cerebellar infarcts. 4. Mucosal thickening with a few scattered air-fluid levels within the paranasal sinuses. Clinical correlation for possible acute sinusitis recommended. These results were communicated to Dr. Jerrie at 11:03 pm on 04/17/2023 by text page via the [facility] messaging system. Electronically Signed   By: Morene Hoard M.D.   On: 04/17/2023 23:04    Microbiology: Results for orders placed or performed during the hospital encounter of 04/17/23  Urine Culture     Status: Abnormal   Collection Time: 04/18/23  3:23 AM   Specimen: In/Out Cath Urine  Result Value Ref Range Status   Specimen Description IN/OUT CATH URINE  Final   Special Requests   Final    NONE Performed at [facility], [address] [place], [place] [address]    Culture >=100,000 COLONIES/mL PROTEUS MIRABILIS (A)  Final   Report Status 04/20/2023 FINAL  Final   Organism ID, Bacteria PROTEUS MIRABILIS (A)  Final      Susceptibility    Proteus mirabilis - MIC*    AMPICILLIN <=2 SENSITIVE Sensitive     CEFAZOLIN 8 SENSITIVE Sensitive     CEFEPIME  <=0.12 SENSITIVE Sensitive     CEFTRIAXONE  <=0.25 SENSITIVE Sensitive     CIPROFLOXACIN >=4 RESISTANT Resistant     GENTAMICIN <=1 SENSITIVE Sensitive     IMIPENEM 4 SENSITIVE Sensitive     NITROFURANTOIN 128 RESISTANT Resistant     TRIMETH/SULFA <=20 SENSITIVE Sensitive     AMPICILLIN/SULBACTAM <=2 SENSITIVE Sensitive     PIP/TAZO <=4 SENSITIVE Sensitive ug/mL    * >=  100,000 COLONIES/mL PROTEUS MIRABILIS    Labs: CBC: Recent Labs  Lab 04/17/23 2330 04/18/23 0738 04/19/23 0525 04/20/23 0321 04/21/23 0328 04/22/23 0736 04/23/23 0359  WBC 9.5   < > 9.4 14.1* 9.7 10.1 8.7  NEUTROABS 4.2  --   --   --   --   --   --   HGB 11.0*   < > 11.0* 9.3* 8.5* 9.0* 8.3*  HCT 33.9*   < > 33.4* 27.6* 25.9* 26.6* 24.7*  MCV 93.4   < > 91.5 91.4 91.5 90.2 91.1  PLT 244   < > 252 224 191 176 178   < > = values in this interval not displayed.   Basic Metabolic Panel: Recent Labs  Lab 04/18/23 2218 04/19/23 0525 04/20/23 0321 04/20/23 0905 04/21/23 0328 04/22/23 0736 04/23/23 0359  NA  --  136 137  --  134* 133* 133*  K  --  3.6 3.7  --  3.7 3.6 3.7  CL  --  104 106  --  105 101 103  CO2  --  21* 21*  --  22 23 23   GLUCOSE  --  139* 171*  --  172* 118* 225*  BUN  --  19 25*  --  24* 18 19  CREATININE  --  1.31* 1.83*  --  1.79* 1.43* 1.43*  CALCIUM   --  8.6* 8.2*  --  8.1* 8.0* 7.8*  MG 1.7 1.6*  --  1.7 2.0  --   --   PHOS  --   --  3.4  --  2.5 2.4* 2.8   Liver Function Tests: Recent Labs  Lab 04/17/23 2330  AST 22  ALT 14  ALKPHOS 75  BILITOT 0.4  PROT 8.8*  ALBUMIN 3.1*   CBG: Recent Labs  Lab 04/22/23 1158 04/22/23 1200 04/22/23 1636 04/22/23 2126 04/23/23 0750  GLUCAP 244* 272* 172* 79 118*    Discharge time spent: greater than 30 minutes.  Signed: Owen DELENA Lore, MD [facility] 04/23/2023

## 2023-04-23 NOTE — Progress Notes (Signed)
 Patient discharging back to [facility] report called to nurse Claudia Pollock, family aware of d/c plan.   Patient transported by [facility] discharge packet given to [facility] staff.  Nino Amano, Kae Heller, RN

## 2023-04-23 NOTE — Plan of Care (Signed)
  Problem: Education: Goal: Knowledge of General Education information will improve Description: Including pain rating scale, medication(s)/side effects and non-pharmacologic comfort measures 04/23/2023 1744 by Lynnette Cena CROME, RN Outcome: Adequate for Discharge 04/23/2023 1313 by Lynnette Cena CROME, RN Outcome: Progressing   Problem: Health Behavior/Discharge Planning: Goal: Ability to manage health-related needs will improve 04/23/2023 1744 by Lynnette Cena CROME, RN Outcome: Adequate for Discharge 04/23/2023 1313 by Lynnette Cena CROME, RN Outcome: Progressing   Problem: Clinical Measurements: Goal: Ability to maintain clinical measurements within normal limits will improve Outcome: Adequate for Discharge Goal: Will remain free from infection Outcome: Adequate for Discharge Goal: Diagnostic test results will improve Outcome: Adequate for Discharge Goal: Respiratory complications will improve Outcome: Adequate for Discharge Goal: Cardiovascular complication will be avoided Outcome: Adequate for Discharge   Problem: Activity: Goal: Risk for activity intolerance will decrease Outcome: Adequate for Discharge   Problem: Nutrition: Goal: Adequate nutrition will be maintained Outcome: Adequate for Discharge   Problem: Elimination: Goal: Will not experience complications related to urinary retention Outcome: Adequate for Discharge   Problem: Pain Management: Goal: General experience of comfort will improve Outcome: Adequate for Discharge   Problem: Safety: Goal: Ability to remain free from injury will improve Outcome: Adequate for Discharge   Problem: Skin Integrity: Goal: Risk for impaired skin integrity will decrease Outcome: Adequate for Discharge   Problem: Coping: Goal: Ability to adjust to condition or change in health will improve Outcome: Adequate for Discharge   Problem: Fluid Volume: Goal: Ability to maintain a balanced intake and output will improve Outcome:  Adequate for Discharge   Problem: Metabolic: Goal: Ability to maintain appropriate glucose levels will improve Outcome: Adequate for Discharge   Problem: Nutritional: Goal: Maintenance of adequate nutrition will improve Outcome: Adequate for Discharge Goal: Progress toward achieving an optimal weight will improve Outcome: Adequate for Discharge   Problem: Skin Integrity: Goal: Risk for impaired skin integrity will decrease Outcome: Adequate for Discharge   Problem: Education: Goal: Understanding of CV disease, CV risk reduction, and recovery process will improve Outcome: Adequate for Discharge Goal: Individualized Educational Video(s) Outcome: Adequate for Discharge   Problem: Tissue Perfusion: Goal: Adequacy of tissue perfusion will improve Outcome: Adequate for Discharge   Problem: Activity: Goal: Ability to return to baseline activity level will improve Outcome: Adequate for Discharge   Problem: Cardiovascular: Goal: Ability to achieve and maintain adequate cardiovascular perfusion will improve Outcome: Adequate for Discharge Goal: Vascular access site(s) Level 0-1 will be maintained Outcome: Adequate for Discharge   Problem: Health Behavior/Discharge Planning: Goal: Ability to safely manage health-related needs after discharge will improve Outcome: Adequate for Discharge   Problem: Malnutrition  (NI-5.2) Goal: Food and/or nutrient delivery Description: Individualized approach for food/nutrient provision. Outcome: Adequate for Discharge

## 2023-05-10 ENCOUNTER — Ambulatory Visit (INDEPENDENT_AMBULATORY_CARE_PROVIDER_SITE_OTHER): Payer: Medicare Other | Admitting: Podiatry

## 2023-05-10 DIAGNOSIS — Z794 Long term (current) use of insulin: Secondary | ICD-10-CM

## 2023-05-10 DIAGNOSIS — L97522 Non-pressure chronic ulcer of other part of left foot with fat layer exposed: Secondary | ICD-10-CM

## 2023-05-10 DIAGNOSIS — N1832 Chronic kidney disease, stage 3b: Secondary | ICD-10-CM

## 2023-05-10 DIAGNOSIS — E1122 Type 2 diabetes mellitus with diabetic chronic kidney disease: Secondary | ICD-10-CM

## 2023-05-10 NOTE — Progress Notes (Signed)
Subjective:  Patient ID: Justin [name], male    DOB: Oct 04, 1934,  MRN: [id number]   89 y.o. male presents for wound care.  Patient presents with left hallux distal tip ulceration.  Patient is a diabetic.  He recently went to the hospital and was admitted due to circulation concern.  He had an angiogram done and was told that this is the best circulation he will have.  They were able to do increase of flow through the anterior tibial artery.  At this time patient has been dealing with this wound for greater than 4 weeks and he would like to discuss treatment options to help him with the wound as he is a high risk of great toe amputation.  He is a diabetic.   Review of Systems: Negative except as noted in the HPI. Denies N/V/F/Ch.  Past Medical History:  Diagnosis Date   Acute metabolic encephalopathy 05/24/2021   Arthritis    Diabetes mellitus    HTN (hypertension)    Hypercholesteremia    Iron deficiency    Pancreatitis, acute 06/02/2012   Diagnosed 05/2012    Personal history of colonic adenomas 04/15/2013   04/15/2013 2 diminutive polyps     Stroke ([name])    TIA (transient ischemic attack)     Current Outpatient Medications:    acetaminophen (TYLENOL) 325 MG tablet, Take 650 mg by mouth daily., Disp: , Rfl:    amoxicillin-clavulanate (AUGMENTIN) 875-125 MG tablet, Take 1 tablet by mouth every 12 (twelve) hours for 21 days., Disp: 42 tablet, Rfl: 0   ascorbic acid (VITAMIN C) 500 MG tablet, Take 1 tablet (500 mg total) by mouth daily., Disp: 30 tablet, Rfl: 0   aspirin 81 MG chewable tablet, Chew 81 mg by mouth daily., Disp: , Rfl:    atorvastatin (LIPITOR) 40 MG tablet, Take 40 mg by mouth at bedtime., Disp: , Rfl:    BREO ELLIPTA 100-25 MCG/ACT AEPB, Inhale 1 puff into the lungs daily., Disp: , Rfl:    clopidogrel (PLAVIX) 75 MG tablet, Take 1 tablet (75 mg total) by mouth daily with breakfast., Disp: 30 tablet, Rfl: 3   divalproex (DEPAKOTE SPRINKLE) 125 MG capsule, Take 125 mg by  mouth 2 (two) times daily., Disp: , Rfl:    docusate sodium (COLACE) 100 MG capsule, Take 100 mg by mouth 2 (two) times daily., Disp: , Rfl:    ferrous gluconate (FERGON) 324 MG tablet, Take 324 mg by mouth daily., Disp: , Rfl:    gabapentin (NEURONTIN) 100 MG capsule, Take 100 mg by mouth 2 (two) times daily., Disp: , Rfl:    glucagon (GLUCAGON EMERGENCY) 1 MG injection, Inject 1 mg into the muscle once as needed. (Patient taking differently: Inject 1 mg into the muscle once as needed (for low blood sugar).), Disp: 1 each, Rfl: 12   hydrOXYzine (ATARAX) 10 MG tablet, Take 1 tablet (10 mg total) by mouth 2 (two) times daily as needed for anxiety or itching., Disp: 30 tablet, Rfl: 0   insulin lispro (HUMALOG) 100 UNIT/ML injection, Inject 0-12 Units into the skin 3 (three) times daily before meals. BS 70-200 give 0 units  BS 201-250 give 2 units  BS 251-300 give 4 units  BS 301-350 give 6 units  BS 351-400 give 8 units  BS 401-450 give 10 units  BS 451-600 give 12 units, Disp: , Rfl:    Ipratropium-Albuterol (COMBIVENT) 20-100 MCG/ACT AERS respimat, Inhale 1 puff into the lungs every 6 (six) hours as needed for  wheezing. (Patient taking differently: Inhale 1 puff into the lungs every 6 (six) hours as needed (for COPD).), Disp: , Rfl:    isosorbide mononitrate (ISMO) 10 MG tablet, Take 10 mg by mouth at bedtime., Disp: , Rfl:    loratadine (CLARITIN) 10 MG tablet, Take 10 mg by mouth daily., Disp: , Rfl:    metoprolol tartrate (LOPRESSOR) 25 MG tablet, Take 0.5 tablets (12.5 mg total) by mouth 2 (two) times daily., Disp: 30 tablet, Rfl: 1   Multiple Vitamin (MULTIVITAMIN WITH MINERALS) TABS tablet, Take 1 tablet by mouth daily., Disp: 30 tablet, Rfl: 0   oxyCODONE (OXY IR/ROXICODONE) 5 MG immediate release tablet, Take 5 mg by mouth every 8 (eight) hours as needed for severe pain (pain score 7-10)., Disp: , Rfl:    oxymetazoline (AFRIN) 0.05 % nasal spray, Place 2 sprays into both nostrils 2 (two) times  daily as needed for congestion., Disp: , Rfl:    Pancrelipase, Lip-Prot-Amyl, (CREON) 24000-76000 units CPEP, Take 1 capsule by mouth 3 (three) times daily. With 4-6 ounces of water, Disp: , Rfl:    pantoprazole (PROTONIX) 40 MG tablet, Take 1 tablet (40 mg total) by mouth daily., Disp: , Rfl:    Pollen Extracts (PROSTAT PO), Take 30 mLs by mouth 3 (three) times daily. 17-100g-kcal/30 ml, Disp: , Rfl:    sertraline (ZOLOFT) 25 MG tablet, Take 25 mg by mouth daily., Disp: , Rfl:    Skin Protectants, Misc. (MINERIN CREME EX), Apply 1 Application topically at bedtime. Both legs and feet., Disp: , Rfl:    Sodium Chloride (NASAL MIST) 0.9 % AERS, Place 2 sprays into the nose in the morning and at bedtime., Disp: , Rfl:    Spacer/Aero-Holding Chambers DEVI, Use as directed with combivent. Ok to dispense any spacer that works with the inhaler being rxed to pt., Disp: 1 each, Rfl: 1   zinc sulfate, 50mg  elemental zinc, 220 (50 Zn) MG capsule, Take 1 capsule (220 mg total) by mouth daily., Disp: 30 capsule, Rfl: 0  Social History   Tobacco Use  Smoking Status Former   Current packs/day: 0.00   Types: Cigarettes   Start date: 04/10/1955   Quit date: 04/09/1990   Years since quitting: 33.1  Smokeless Tobacco Never    No Known Allergies Objective:  There were no vitals filed for this visit. There is no height or weight on file to calculate BMI. Constitutional Well developed. Well nourished.  Vascular Dorsalis pedis pulses palpable bilaterally. Posterior tibial pulses palpable bilaterally. Capillary refill normal to all digits.  No cyanosis or clubbing noted. Pedal hair growth normal.  Neurologic Normal speech. Oriented to person, place, and time. Protective sensation absent  Dermatologic Wound Location: Left distal hallux ulceration with fat layer exposed with no exposure of bone noted.  Adequate bleeding noted.  Fibrogranular wound bed. Wound Base: Mixed Granular/Fibrotic Peri-wound:  Normal Exudate: Scant/small amount Serous exudate Wound Measurements: -See below  Orthopedic: No pain to palpation either foot.   Radiographs: IMPRESSION: 1. Cortical irregularity of the tuft of the first distal phalanx with marrow edema concerning for osteomyelitis. No drainable fluid collection to suggest an abscess.  However clinically patient does not have exposure of bone as he has granular wound bed 2. Marrow edema in the medial hallux sesamoid as can be seen with sesamoiditis versus mild arthritic changes. 3. Moderate osteoarthritis of the first MTP joint. Mild osteoarthritis of the first TMT joint. Mild osteoarthritis of the talonavicular joint.   Assessment:   1.  Chronic toe ulcer, left, with fat layer exposed ([name])   2. Type 2 diabetes mellitus with stage 3b chronic kidney disease, with long-term current use of insulin ([name])    Plan:  Patient was evaluated and treated and all questions answered.  Ulcer Left hallux fat layer exposed  -Debridement as below. -Dressed with Betadine wet-to-dry, DSD. -Continue off-loading with surgical shoe. -61 patient has failed 4 weeks of therapy with local wound care.  Patient already had angiogram intervention and has a best flow and is optimized from vascular standpoint.  At this time patient will benefit from grafting.  Will work on graft for approval  -Pedal flow disadvantaged, so may not heal a toe amputation despite revascularization. He is optimized from a vascular standpoint.   Procedure: Excisional Debridement of Wound Tool: Sharp chisel blade/tissue nipper Rationale: Removal of non-viable soft tissue from the wound to promote healing.  Anesthesia: none Pre-Debridement Wound Measurements: 2.2 cm x 1.6 cm x 0.3 cm  Post-Debridement Wound Measurements: 2.2 cm x 1.6 cm x 0.3 cm  Type of Debridement: Sharp Excisional Tissue Removed: Non-viable soft tissue Blood loss: Minimal (<50cc) Depth of Debridement: subcutaneous  tissue. Technique: Sharp excisional debridement to bleeding, viable wound base.  Wound Progress: this is my initial evaluation after the hospital.  Site healing conversation 7 Dressing: Dry, sterile, compression dressing. Disposition: Patient tolerated procedure well. Patient to return in 1 week for follow-up.  No follow-ups on file.

## 2023-05-16 ENCOUNTER — Other Ambulatory Visit: Payer: Self-pay | Admitting: Podiatry

## 2023-05-17 ENCOUNTER — Ambulatory Visit (INDEPENDENT_AMBULATORY_CARE_PROVIDER_SITE_OTHER): Payer: Medicare Other | Admitting: Podiatry

## 2023-05-17 DIAGNOSIS — E1122 Type 2 diabetes mellitus with diabetic chronic kidney disease: Secondary | ICD-10-CM | POA: Diagnosis not present

## 2023-05-17 DIAGNOSIS — L97522 Non-pressure chronic ulcer of other part of left foot with fat layer exposed: Secondary | ICD-10-CM

## 2023-05-17 DIAGNOSIS — Z794 Long term (current) use of insulin: Secondary | ICD-10-CM

## 2023-05-17 DIAGNOSIS — N1832 Chronic kidney disease, stage 3b: Secondary | ICD-10-CM | POA: Diagnosis not present

## 2023-05-17 NOTE — Progress Notes (Signed)
 Subjective:  Patient ID: Justin [name], male    DOB: Oct 24, 1934,  MRN: [id number]   89 y.o. male presents for wound care.  Patient presents with follow-up of left hallux distal tip ulceration.  He states he is doing okay.  Mood is doing better.  Denies any other acute complaints Betadine wet-to-dry he has been applying   Review of Systems: Negative except as noted in the HPI. Denies N/V/F/Ch.  Past Medical History:  Diagnosis Date   Acute metabolic encephalopathy 05/24/2021   Arthritis    Diabetes mellitus    HTN (hypertension)    Hypercholesteremia    Iron  deficiency    Pancreatitis, acute 06/02/2012   Diagnosed 05/2012    Personal history of colonic adenomas 04/15/2013   04/15/2013 2 diminutive polyps     Stroke [facility])    TIA (transient ischemic attack)     Current Outpatient Medications:    acetaminophen  (TYLENOL ) 325 MG tablet, Take 650 mg by mouth daily., Disp: , Rfl:    ascorbic acid  (VITAMIN C ) 500 MG tablet, Take 1 tablet (500 mg total) by mouth daily., Disp: 30 tablet, Rfl: 0   aspirin  81 MG chewable tablet, Chew 81 mg by mouth daily., Disp: , Rfl:    atorvastatin  (LIPITOR ) 40 MG tablet, Take 40 mg by mouth at bedtime., Disp: , Rfl:    BREO ELLIPTA  100-25 MCG/ACT AEPB, Inhale 1 puff into the lungs daily., Disp: , Rfl:    clopidogrel  (PLAVIX ) 75 MG tablet, Take 1 tablet (75 mg total) by mouth daily with breakfast., Disp: 30 tablet, Rfl: 3   divalproex  (DEPAKOTE  SPRINKLE) 125 MG capsule, Take 125 mg by mouth 2 (two) times daily., Disp: , Rfl:    docusate sodium  (COLACE) 100 MG capsule, Take 100 mg by mouth 2 (two) times daily., Disp: , Rfl:    ferrous gluconate  (FERGON) 324 MG tablet, Take 324 mg by mouth daily., Disp: , Rfl:    gabapentin  (NEURONTIN ) 100 MG capsule, Take 100 mg by mouth 2 (two) times daily., Disp: , Rfl:    glucagon  (GLUCAGON  EMERGENCY) 1 MG injection, Inject 1 mg into the muscle once as needed. (Patient taking differently: Inject 1 mg into the muscle once as  needed (for low blood sugar).), Disp: 1 each, Rfl: 12   hydrOXYzine  (ATARAX ) 10 MG tablet, Take 1 tablet (10 mg total) by mouth 2 (two) times daily as needed for anxiety or itching., Disp: 30 tablet, Rfl: 0   insulin  lispro (HUMALOG ) 100 UNIT/ML injection, Inject 0-12 Units into the skin 3 (three) times daily before meals. BS 70-200 give 0 units  BS 201-250 give 2 units  BS 251-300 give 4 units  BS 301-350 give 6 units  BS 351-400 give 8 units  BS 401-450 give 10 units  BS 451-600 give 12 units, Disp: , Rfl:    Ipratropium-Albuterol  (COMBIVENT) 20-100 MCG/ACT AERS respimat, Inhale 1 puff into the lungs every 6 (six) hours as needed for wheezing. (Patient taking differently: Inhale 1 puff into the lungs every 6 (six) hours as needed (for COPD).), Disp: , Rfl:    isosorbide  mononitrate (ISMO ) 10 MG tablet, Take 10 mg by mouth at bedtime., Disp: , Rfl:    loratadine  (CLARITIN ) 10 MG tablet, Take 10 mg by mouth daily., Disp: , Rfl:    metoprolol  tartrate (LOPRESSOR ) 25 MG tablet, Take 0.5 tablets (12.5 mg total) by mouth 2 (two) times daily., Disp: 30 tablet, Rfl: 1   Multiple Vitamin (MULTIVITAMIN WITH MINERALS) TABS tablet, Take 1  tablet by mouth daily., Disp: 30 tablet, Rfl: 0   oxyCODONE  (OXY IR/ROXICODONE ) 5 MG immediate release tablet, Take 5 mg by mouth every 8 (eight) hours as needed for severe pain (pain score 7-10)., Disp: , Rfl:    oxymetazoline  (AFRIN) 0.05 % nasal spray, Place 2 sprays into both nostrils 2 (two) times daily as needed for congestion., Disp: , Rfl:    Pancrelipase , Lip-Prot-Amyl, (CREON ) 24000-76000 units CPEP, Take 1 capsule by mouth 3 (three) times daily. With 4-6 ounces of water, Disp: , Rfl:    pantoprazole  (PROTONIX ) 40 MG tablet, Take 1 tablet (40 mg total) by mouth daily., Disp: , Rfl:    Pollen Extracts (PROSTAT PO), Take 30 mLs by mouth 3 (three) times daily. 17-100g-kcal/30 ml, Disp: , Rfl:    sertraline  (ZOLOFT ) 25 MG tablet, Take 25 mg by mouth daily., Disp: , Rfl:     Skin Protectants, Misc. (MINERIN CREME EX), Apply 1 Application topically at bedtime. Both legs and feet., Disp: , Rfl:    Sodium Chloride  (NASAL MIST) 0.9 % AERS, Place 2 sprays into the nose in the morning and at bedtime., Disp: , Rfl:    Spacer/Aero-Holding Chambers DEVI, Use as directed with combivent. Ok to dispense any spacer that works with the inhaler being rxed to pt., Disp: 1 each, Rfl: 1   zinc  sulfate, 50mg  elemental zinc , 220 (50 Zn) MG capsule, Take 1 capsule (220 mg total) by mouth daily., Disp: 30 capsule, Rfl: 0  Social History   Tobacco Use  Smoking Status Former   Current packs/day: 0.00   Types: Cigarettes   Start date: 04/10/1955   Quit date: 04/09/1990   Years since quitting: 33.1  Smokeless Tobacco Never    No Known Allergies Objective:  There were no vitals filed for this visit. There is no height or weight on file to calculate BMI. Constitutional Well developed. Well nourished.  Vascular Dorsalis pedis pulses palpable bilaterally. Posterior tibial pulses palpable bilaterally. Capillary refill normal to all digits.  No cyanosis or clubbing noted. Pedal hair growth normal.  Neurologic Normal speech. Oriented to person, place, and time. Protective sensation absent  Dermatologic Wound Location: Left distal hallux ulceration with fat layer exposed with no exposure of bone noted.  Adequate bleeding noted.  Fibrogranular wound bed. Wound Base: Mixed Granular/Fibrotic Peri-wound: Normal Exudate: Scant/small amount Serous exudate Wound Measurements: -See below  Orthopedic: No pain to palpation either foot.   Radiographs: IMPRESSION: 1. Cortical irregularity of the tuft of the first distal phalanx with marrow edema concerning for osteomyelitis. No drainable fluid collection to suggest an abscess.  However clinically patient does not have exposure of bone as he has granular wound bed 2. Marrow edema in the medial hallux sesamoid as can be seen with sesamoiditis  versus mild arthritic changes. 3. Moderate osteoarthritis of the first MTP joint. Mild osteoarthritis of the first TMT joint. Mild osteoarthritis of the talonavicular joint.   Assessment:   No diagnosis found.  Plan:  Patient was evaluated and treated and all questions answered.  Ulcer Left hallux fat layer exposed  -Debridement as below. -Dressed with Betadine wet-to-dry, DSD. -Continue off-loading with surgical shoe. -61 patient has failed 4 weeks of therapy with local wound care.  Patient already had angiogram intervention and has a best flow and is optimized from vascular standpoint.  At this time patient will benefit from grafting.  Will work on graft for approval  -Pedal flow disadvantaged, so may not heal a toe amputation despite revascularization. He  is optimized from a vascular standpoint.  -Unfortunately patient is not able to undergoing graft until he is out of the rehab facility per insurance  Procedure: Excisional Debridement of Wound Tool: Sharp chisel blade/tissue nipper Rationale: Removal of non-viable soft tissue from the wound to promote healing.  Anesthesia: none Pre-Debridement Wound Measurements: 2.0 cm x 1.6 cm x 0.3 cm  Post-Debridement Wound Measurements: 2.0 cm x 1.6 cm x 0.3 cm  Type of Debridement: Sharp Excisional Tissue Removed: Non-viable soft tissue Blood loss: Minimal (<50cc) Depth of Debridement: subcutaneous tissue. Technique: Sharp excisional debridement to bleeding, viable wound base.  Wound Progress: Wound is slight better Site healing conversation 7 Dressing: Dry, sterile, compression dressing. Disposition: Patient tolerated procedure well. Patient to return in 1 week for follow-up.  No follow-ups on file.

## 2023-06-03 NOTE — Progress Notes (Unsigned)
 [facility]   ASSESSMENT / PLAN: Justin [name] is a 89 y.o. male status post left anterior tibial angioplasty 04/19/23 for CLTI  Recommend:  Abstinence from all tobacco products. Blood glucose control with goal A1c < 7%. Blood pressure control with goal blood pressure < 140/90 mmHg. Lipid reduction therapy with goal LDL-C <100 mg/dL  Aspirin 81mg  PO QD.  Clopidogrel 75mg  PO QD. Atorvastatin 40-80mg  PO QD (or other "high intensity" statin therapy).  Good technical result noted on noninvasive testing today.  He is maximized from a vascular standpoint.  Follow-up with me as needed.  CHIEF COMPLAINT: Left great toe ulceration  HISTORY OF PRESENT ILLNESS: Justin [name] is a 89 y.o. male well-known to me with history of right lower extremity tibial intervention and transmetatarsal amputation which is healed well.  He recently underwent a left anterior tibial angioplasty for left great toe ulceration.  This was technically successful.  The patient presents today in a wheelchair without a caregiver.  He is pleasantly confused and not able to provide much history.  He thinks the toe is getting better.  It is not painful to him.  Past Medical History:  Diagnosis Date   Acute metabolic encephalopathy 05/24/2021   Arthritis    Diabetes mellitus    HTN (hypertension)    Hypercholesteremia    Iron deficiency    Pancreatitis, acute 06/02/2012   Diagnosed 05/2012    Personal history of colonic adenomas 04/15/2013   04/15/2013 2 diminutive polyps     Stroke [facility])    TIA (transient ischemic attack)     Past Surgical History:  Procedure Laterality Date   ABDOMINAL AORTOGRAM W/LOWER EXTREMITY Right 11/24/2021   Procedure: ABDOMINAL AORTOGRAM W/LOWER EXTREMITY;  Surgeon: Leonie Douglas, MD;  Location: [facility];  Service: Cardiovascular;  Laterality: Right;   ABDOMINAL AORTOGRAM W/LOWER EXTREMITY Left 04/19/2023   Procedure: ABDOMINAL AORTOGRAM W/LOWER  EXTREMITY;  Surgeon: Leonie Douglas, MD;  Location: [facility];  Service: Cardiovascular;  Laterality: Left;   AMPUTATION TOE Right 11/26/2021   Procedure: AMPUTATION TOE;  Surgeon: Vivi Barrack, DPM;  Location: [facility];  Service: Podiatry;  Laterality: Right;   ARTERY BIOPSY  02/26/2011   Procedure: BIOPSY TEMPORAL ARTERY;  Surgeon: Iona Coach, MD;  Location: [facility];  Service: General;  Laterality: Left;  Left temporal artery biospy   BACK SURGERY  1985   CHOLECYSTECTOMY     COLONOSCOPY     ESOPHAGOGASTRODUODENOSCOPY N/A 12/06/2022   Procedure: ESOPHAGOGASTRODUODENOSCOPY (EGD);  Surgeon: Shellia Cleverly, DO;  Location: [facility] ENDOSCOPY;  Service: Gastroenterology;  Laterality: N/A;   EUS     HEMOSTASIS CLIP PLACEMENT  12/06/2022   Procedure: HEMOSTASIS CLIP PLACEMENT;  Surgeon: Shellia Cleverly, DO;  Location: [facility];  Service: Gastroenterology;;   HOT HEMOSTASIS N/A 12/06/2022   Procedure: HOT HEMOSTASIS (ARGON PLASMA COAGULATION/BICAP);  Surgeon: Shellia Cleverly, DO;  Location: [facility];  Service: Gastroenterology;  Laterality: N/A;   NECK SURGERY  1985   PERIPHERAL VASCULAR BALLOON ANGIOPLASTY Right 11/24/2021   Procedure: PERIPHERAL VASCULAR BALLOON ANGIOPLASTY;  Surgeon: Leonie Douglas, MD;  Location: [facility];  Service: Cardiovascular;  Laterality: Right;  POPLITEAL AND PERONEAL   PERIPHERAL VASCULAR BALLOON ANGIOPLASTY Left 04/19/2023   Procedure: PERIPHERAL VASCULAR BALLOON ANGIOPLASTY;  Surgeon: Leonie Douglas, MD;  Location: [facility];  Service: Cardiovascular;  Laterality: Left;   TEMPORAL ARTERY BIOPSY / LIGATION  02/26/2011  left side   TRANSMETATARSAL AMPUTATION Right 01/28/2022   Procedure: TRANSMETATARSAL AMPUTATION; ACHILLES TENDON LENGTHENING;  Surgeon: Candelaria Stagers, DPM;  Location: [facility];  Service: Podiatry;  Laterality: Right;  R foot Transmetatarsal amputation, possible tendo achilles lengthening    UPPER GASTROINTESTINAL ENDOSCOPY      Family History  Problem Relation Age of Onset   Heart disease Mother    Amblyopia Father    Diabetes Sister        x 2   Heart attack Sister     Social History   Socioeconomic History   Marital status: Married    Spouse name: Not on file   Number of children: 6   Years of education: Not on file   Highest education level: Not on file  Occupational History   Not on file  Tobacco Use   Smoking status: Former    Current packs/day: 0.00    Types: Cigarettes    Start date: 04/10/1955    Quit date: 04/09/1990    Years since quitting: 33.1   Smokeless tobacco: Never  Vaping Use   Vaping status: Never Used  Substance and Sexual Activity   Alcohol use: No    Comment: "per pt stopped drinking in the 90's"   Drug use: No   Sexual activity: Not Currently  Other Topics Concern   Not on file  Social History Narrative   Not on file   Social Drivers of Health   Financial Resource Strain: Not on file  Food Insecurity: No Food Insecurity (04/22/2023)   Hunger Vital Sign    Worried About Running Out of Food in the Last Year: Never true    Ran Out of Food in the Last Year: Never true  Transportation Needs: No Transportation Needs (04/22/2023)   PRAPARE - Administrator, Civil Service (Medical): No    Lack of Transportation (Non-Medical): No  Physical Activity: Not on file  Stress: Not on file  Social Connections: Unknown (04/22/2023)   Social Connection and Isolation Panel [NHANES]    Frequency of Communication with Friends and Family: More than three times a week    Frequency of Social Gatherings with Friends and Family: Three times a week    Attends Religious Services: Patient unable to answer    Active Member of Clubs or Organizations: Patient declined    Attends Banker Meetings: Patient unable to answer    Marital Status: Married  Intimate Partner Violence: Not At Risk (04/22/2023)   Humiliation, Afraid, Rape,  and Kick questionnaire    Fear of Current or Ex-Partner: No    Emotionally Abused: No    Physically Abused: No    Sexually Abused: No    No Known Allergies  Current Outpatient Medications  Medication Sig Dispense Refill   acetaminophen (TYLENOL) 325 MG tablet Take 650 mg by mouth daily.     ascorbic acid (VITAMIN C) 500 MG tablet Take 1 tablet (500 mg total) by mouth daily. 30 tablet 0   aspirin 81 MG chewable tablet Chew 81 mg by mouth daily.     atorvastatin (LIPITOR) 40 MG tablet Take 40 mg by mouth at bedtime.     BREO ELLIPTA 100-25 MCG/ACT AEPB Inhale 1 puff into the lungs daily.     clopidogrel (PLAVIX) 75 MG tablet Take 1 tablet (75 mg total) by mouth daily with breakfast. 30 tablet 3   divalproex (DEPAKOTE SPRINKLE) 125 MG capsule Take 125 mg by mouth 2 (two) times daily.  docusate sodium (COLACE) 100 MG capsule Take 100 mg by mouth 2 (two) times daily.     ferrous gluconate (FERGON) 324 MG tablet Take 324 mg by mouth daily.     gabapentin (NEURONTIN) 100 MG capsule Take 100 mg by mouth 2 (two) times daily.     glucagon (GLUCAGON EMERGENCY) 1 MG injection Inject 1 mg into the muscle once as needed. (Patient taking differently: Inject 1 mg into the muscle once as needed (for low blood sugar).) 1 each 12   hydrOXYzine (ATARAX) 10 MG tablet Take 1 tablet (10 mg total) by mouth 2 (two) times daily as needed for anxiety or itching. 30 tablet 0   insulin lispro (HUMALOG) 100 UNIT/ML injection Inject 0-12 Units into the skin 3 (three) times daily before meals. BS 70-200 give 0 units  BS 201-250 give 2 units  BS 251-300 give 4 units  BS 301-350 give 6 units  BS 351-400 give 8 units  BS 401-450 give 10 units  BS 451-600 give 12 units     Ipratropium-Albuterol (COMBIVENT) 20-100 MCG/ACT AERS respimat Inhale 1 puff into the lungs every 6 (six) hours as needed for wheezing. (Patient taking differently: Inhale 1 puff into the lungs every 6 (six) hours as needed (for COPD).)      isosorbide mononitrate (ISMO) 10 MG tablet Take 10 mg by mouth at bedtime.     loratadine (CLARITIN) 10 MG tablet Take 10 mg by mouth daily.     metoprolol tartrate (LOPRESSOR) 25 MG tablet Take 0.5 tablets (12.5 mg total) by mouth 2 (two) times daily. 30 tablet 1   Multiple Vitamin (MULTIVITAMIN WITH MINERALS) TABS tablet Take 1 tablet by mouth daily. 30 tablet 0   oxyCODONE (OXY IR/ROXICODONE) 5 MG immediate release tablet Take 5 mg by mouth every 8 (eight) hours as needed for severe pain (pain score 7-10).     oxymetazoline (AFRIN) 0.05 % nasal spray Place 2 sprays into both nostrils 2 (two) times daily as needed for congestion.     Pancrelipase, Lip-Prot-Amyl, (CREON) 24000-76000 units CPEP Take 1 capsule by mouth 3 (three) times daily. With 4-6 ounces of water     pantoprazole (PROTONIX) 40 MG tablet Take 1 tablet (40 mg total) by mouth daily.     Pollen Extracts (PROSTAT PO) Take 30 mLs by mouth 3 (three) times daily. 17-100g-kcal/30 ml     sertraline (ZOLOFT) 25 MG tablet Take 25 mg by mouth daily.     Skin Protectants, Misc. (MINERIN CREME EX) Apply 1 Application topically at bedtime. Both legs and feet.     Sodium Chloride (NASAL MIST) 0.9 % AERS Place 2 sprays into the nose in the morning and at bedtime.     Spacer/Aero-Holding Rudean Curt Use as directed with combivent. Ok to dispense any spacer that works with the inhaler being rxed to pt. 1 each 1   zinc sulfate, 50mg  elemental zinc, 220 (50 Zn) MG capsule Take 1 capsule (220 mg total) by mouth daily. 30 capsule 0   No current facility-administered medications for this visit.    PHYSICAL EXAM There were no vitals filed for this visit.  Elderly man in no distress Regular rate and rhythm Unlabored breathing No palpable pedal pulses Left great toe with ulcer measuring about the size of a dime.  This is dry with an eschar that is unstageable  PERTINENT LABORATORY AND RADIOLOGIC DATA  Most recent CBC    Latest Ref Rng & Units  04/23/2023    3:59  AM 04/22/2023    7:36 AM 04/21/2023    3:28 AM  CBC  WBC 4.0 - 10.5 K/uL 8.7  10.1  9.7   Hemoglobin 13.0 - 17.0 g/dL 8.3  9.0  8.5   Hematocrit 39.0 - 52.0 % 24.7  26.6  25.9   Platelets 150 - 400 K/uL 178  176  191      Most recent CMP    Latest Ref Rng & Units 04/23/2023    3:59 AM 04/22/2023    7:36 AM 04/21/2023    3:28 AM  CMP  Glucose 70 - 99 mg/dL 161  096  045   BUN 8 - 23 mg/dL 19  18  24    Creatinine 0.61 - 1.24 mg/dL 4.09  8.11  9.14   Sodium 135 - 145 mmol/L 133  133  134   Potassium 3.5 - 5.1 mmol/L 3.7  3.6  3.7   Chloride 98 - 111 mmol/L 103  101  105   CO2 22 - 32 mmol/L 23  23  22    Calcium 8.9 - 10.3 mg/dL 7.8  8.0  8.1     Renal function CrCl cannot be calculated (Patient's most recent lab result is older than the maximum 21 days allowed.).  Hgb A1c MFr Bld (%)  Date Value  04/18/2023 8.8 (H)    LDL Cholesterol  Date Value Ref Range Status  04/18/2023 44 0 - 99 mg/dL Final    Comment:           Total Cholesterol/HDL:CHD Risk Coronary Heart Disease Risk Table                     Men   Women  1/2 Average Risk   3.4   3.3  Average Risk       5.0   4.4  2 X Average Risk   9.6   7.1  3 X Average Risk  23.4   11.0        Use the calculated Patient Ratio above and the CHD Risk Table to determine the patient's CHD Risk.        ATP III CLASSIFICATION (LDL):  <100     mg/dL   Optimal  782-956  mg/dL   Near or Above                    Optimal  130-159  mg/dL   Borderline  213-086  mg/dL   High  >578     mg/dL   Very High Performed at [facility], [address], [place], [place] [address]      +-------+-----------+-----------+------------+------------+  ABI/TBIToday's ABIToday's TBIPrevious ABIPrevious TBI  +-------+-----------+-----------+------------+------------+  Right 0.95       amputation 1.09        amputation    +-------+-----------+-----------+------------+------------+  Left  1.23       0.63        0.93        0.0           +-------+-----------+-----------+------------+------------+   Toe pressure on left is 93  Shenandoah Vandergriff N. Lenell Antu, MD FACS [facility] Phone Number: [phone] 06/03/2023 9:01 AM   Total time spent on preparing this encounter including chart review, data review, collecting history, examining the patient, coordinating care for this established patient, 30 minutes.  Portions of this report may have been transcribed using voice recognition software.  Every effort has been made to  ensure accuracy; however, inadvertent computerized transcription errors may still be present.

## 2023-06-04 ENCOUNTER — Ambulatory Visit (INDEPENDENT_AMBULATORY_CARE_PROVIDER_SITE_OTHER)
Admission: RE | Admit: 2023-06-04 | Discharge: 2023-06-04 | Disposition: A | Discharge status: Home / Self Care | Payer: Medicare Other | Source: Ambulatory Visit | Attending: Vascular Surgery

## 2023-06-04 ENCOUNTER — Ambulatory Visit (INDEPENDENT_AMBULATORY_CARE_PROVIDER_SITE_OTHER): Payer: Medicare Other | Admitting: Vascular Surgery

## 2023-06-04 ENCOUNTER — Encounter: Payer: Self-pay | Admitting: Vascular Surgery

## 2023-06-04 ENCOUNTER — Ambulatory Visit (HOSPITAL_COMMUNITY)
Admission: RE | Admit: 2023-06-04 | Discharge: 2023-06-04 | Disposition: A | Discharge status: Home / Self Care | Payer: Medicare Other | Source: Ambulatory Visit | Attending: Vascular Surgery | Admitting: Vascular Surgery

## 2023-06-04 VITALS — BP 129/60 | HR 88 | Temp 98.3°F | Ht 74.0 in | Wt 154.0 lb

## 2023-06-04 DIAGNOSIS — I739 Peripheral vascular disease, unspecified: Secondary | ICD-10-CM

## 2023-06-04 LAB — VAS US ABI WITH/WO TBI
Left ABI: 1.23
Right ABI: 0.95

## 2023-06-07 ENCOUNTER — Ambulatory Visit (INDEPENDENT_AMBULATORY_CARE_PROVIDER_SITE_OTHER): Payer: Medicare Other | Admitting: Podiatry

## 2023-06-07 DIAGNOSIS — N1832 Chronic kidney disease, stage 3b: Secondary | ICD-10-CM

## 2023-06-07 DIAGNOSIS — E1122 Type 2 diabetes mellitus with diabetic chronic kidney disease: Secondary | ICD-10-CM | POA: Diagnosis not present

## 2023-06-07 DIAGNOSIS — L97522 Non-pressure chronic ulcer of other part of left foot with fat layer exposed: Secondary | ICD-10-CM

## 2023-06-07 DIAGNOSIS — Z794 Long term (current) use of insulin: Secondary | ICD-10-CM | POA: Diagnosis not present

## 2023-06-07 NOTE — Progress Notes (Signed)
 Subjective:  Patient ID: Justin [name], male    DOB: 29-Sep-1934,  MRN: [id number]   89 y.o. male presents for wound care.  Patient presents with follow-up of left hallux distal tip ulceration.  He states is doing okay.  The wound appears to be about the same does not cause him any pain   Review of Systems: Negative except as noted in the HPI. Denies N/V/F/Ch.  Past Medical History:  Diagnosis Date   Acute metabolic encephalopathy 05/24/2021   Arthritis    Diabetes mellitus    HTN (hypertension)    Hypercholesteremia    Iron deficiency    Pancreatitis, acute 06/02/2012   Diagnosed 05/2012    Personal history of colonic adenomas 04/15/2013   04/15/2013 2 diminutive polyps     Stroke ([name])    TIA (transient ischemic attack)     Current Outpatient Medications:    acetaminophen (TYLENOL) 325 MG tablet, Take 650 mg by mouth daily., Disp: , Rfl:    ascorbic acid (VITAMIN C) 500 MG tablet, Take 1 tablet (500 mg total) by mouth daily., Disp: 30 tablet, Rfl: 0   aspirin 81 MG chewable tablet, Chew 81 mg by mouth daily., Disp: , Rfl:    atorvastatin (LIPITOR) 40 MG tablet, Take 40 mg by mouth at bedtime., Disp: , Rfl:    BREO ELLIPTA 100-25 MCG/ACT AEPB, Inhale 1 puff into the lungs daily., Disp: , Rfl:    clopidogrel (PLAVIX) 75 MG tablet, Take 1 tablet (75 mg total) by mouth daily with breakfast., Disp: 30 tablet, Rfl: 3   divalproex (DEPAKOTE SPRINKLE) 125 MG capsule, Take 125 mg by mouth 2 (two) times daily., Disp: , Rfl:    docusate sodium (COLACE) 100 MG capsule, Take 100 mg by mouth 2 (two) times daily., Disp: , Rfl:    ferrous gluconate (FERGON) 324 MG tablet, Take 324 mg by mouth daily., Disp: , Rfl:    gabapentin (NEURONTIN) 100 MG capsule, Take 100 mg by mouth 2 (two) times daily., Disp: , Rfl:    glucagon (GLUCAGON EMERGENCY) 1 MG injection, Inject 1 mg into the muscle once as needed. (Patient taking differently: Inject 1 mg into the muscle once as needed (for low blood sugar).),  Disp: 1 each, Rfl: 12   hydrOXYzine (ATARAX) 10 MG tablet, Take 1 tablet (10 mg total) by mouth 2 (two) times daily as needed for anxiety or itching., Disp: 30 tablet, Rfl: 0   insulin lispro (HUMALOG) 100 UNIT/ML injection, Inject 0-12 Units into the skin 3 (three) times daily before meals. BS 70-200 give 0 units  BS 201-250 give 2 units  BS 251-300 give 4 units  BS 301-350 give 6 units  BS 351-400 give 8 units  BS 401-450 give 10 units  BS 451-600 give 12 units, Disp: , Rfl:    Ipratropium-Albuterol (COMBIVENT) 20-100 MCG/ACT AERS respimat, Inhale 1 puff into the lungs every 6 (six) hours as needed for wheezing. (Patient taking differently: Inhale 1 puff into the lungs every 6 (six) hours as needed (for COPD).), Disp: , Rfl:    isosorbide mononitrate (ISMO) 10 MG tablet, Take 10 mg by mouth at bedtime., Disp: , Rfl:    loratadine (CLARITIN) 10 MG tablet, Take 10 mg by mouth daily., Disp: , Rfl:    metoprolol tartrate (LOPRESSOR) 25 MG tablet, Take 0.5 tablets (12.5 mg total) by mouth 2 (two) times daily., Disp: 30 tablet, Rfl: 1   Multiple Vitamin (MULTIVITAMIN WITH MINERALS) TABS tablet, Take 1 tablet by mouth  daily., Disp: 30 tablet, Rfl: 0   oxyCODONE (OXY IR/ROXICODONE) 5 MG immediate release tablet, Take 5 mg by mouth every 8 (eight) hours as needed for severe pain (pain score 7-10)., Disp: , Rfl:    oxymetazoline (AFRIN) 0.05 % nasal spray, Place 2 sprays into both nostrils 2 (two) times daily as needed for congestion., Disp: , Rfl:    Pancrelipase, Lip-Prot-Amyl, (CREON) 24000-76000 units CPEP, Take 1 capsule by mouth 3 (three) times daily. With 4-6 ounces of water, Disp: , Rfl:    pantoprazole (PROTONIX) 40 MG tablet, Take 1 tablet (40 mg total) by mouth daily., Disp: , Rfl:    Pollen Extracts (PROSTAT PO), Take 30 mLs by mouth 3 (three) times daily. 17-100g-kcal/30 ml, Disp: , Rfl:    sertraline (ZOLOFT) 25 MG tablet, Take 25 mg by mouth daily., Disp: , Rfl:    Skin Protectants, Misc.  (MINERIN CREME EX), Apply 1 Application topically at bedtime. Both legs and feet., Disp: , Rfl:    Sodium Chloride (NASAL MIST) 0.9 % AERS, Place 2 sprays into the nose in the morning and at bedtime., Disp: , Rfl:    Spacer/Aero-Holding Chambers DEVI, Use as directed with combivent. Ok to dispense any spacer that works with the inhaler being rxed to pt., Disp: 1 each, Rfl: 1   zinc sulfate, 50mg  elemental zinc, 220 (50 Zn) MG capsule, Take 1 capsule (220 mg total) by mouth daily., Disp: 30 capsule, Rfl: 0  Social History   Tobacco Use  Smoking Status Former   Current packs/day: 0.00   Types: Cigarettes   Start date: 04/10/1955   Quit date: 04/09/1990   Years since quitting: 33.1  Smokeless Tobacco Never    No Known Allergies Objective:  There were no vitals filed for this visit. There is no height or weight on file to calculate BMI. Constitutional Well developed. Well nourished.  Vascular Dorsalis pedis pulses palpable bilaterally. Posterior tibial pulses palpable bilaterally. Capillary refill normal to all digits.  No cyanosis or clubbing noted. Pedal hair growth normal.  Neurologic Normal speech. Oriented to person, place, and time. Protective sensation absent  Dermatologic Wound Location: Left distal hallux ulceration with fat layer exposed with no exposure of bone noted.  Adequate bleeding noted.  Fibrogranular wound bed. Wound Base: Mixed Granular/Fibrotic Peri-wound: Normal Exudate: Scant/small amount Serous exudate Wound Measurements: -See below  Orthopedic: No pain to palpation either foot.   Radiographs: IMPRESSION: 1. Cortical irregularity of the tuft of the first distal phalanx with marrow edema concerning for osteomyelitis. No drainable fluid collection to suggest an abscess.  However clinically patient does not have exposure of bone as he has granular wound bed 2. Marrow edema in the medial hallux sesamoid as can be seen with sesamoiditis versus mild arthritic  changes. 3. Moderate osteoarthritis of the first MTP joint. Mild osteoarthritis of the first TMT joint. Mild osteoarthritis of the talonavicular joint.   Assessment:   No diagnosis found.  Plan:  Patient was evaluated and treated and all questions answered.  Ulcer Left hallux fat layer exposed  -Debridement as below. -Dressed with Betadine wet-to-dry, DSD. -Continue off-loading with surgical shoe. -59 patient has failed 4 weeks of therapy with local wound care.  Patient already had angiogram intervention and has a best flow and is optimized from vascular standpoint.  At this time patient will benefit from grafting.  Will work on graft for approval  -Pedal flow disadvantaged, so may not heal a toe amputation despite revascularization. He is optimized from  a vascular standpoint.  -Unfortunately patient is not able to undergoing graft until he is out of the rehab facility per insurance  Procedure: Excisional Debridement of Wound~stagnant Tool: Sharp chisel blade/tissue nipper Rationale: Removal of non-viable soft tissue from the wound to promote healing.  Anesthesia: none Pre-Debridement Wound Measurements: 2.0 cm x 1.6 cm x 0.3 cm  Post-Debridement Wound Measurements: 2.0 cm x 1.6 cm x 0.3 cm  Type of Debridement: Sharp Excisional Tissue Removed: Non-viable soft tissue Blood loss: Minimal (<50cc) Depth of Debridement: subcutaneous tissue. Technique: Sharp excisional debridement to bleeding, viable wound base.  Wound Progress: Wound is stagnant Site healing conversation 7 Dressing: Dry, sterile, compression dressing. Disposition: Patient tolerated procedure well. Patient to return in 1 week for follow-up.  No follow-ups on file.

## 2023-06-14 ENCOUNTER — Ambulatory Visit (INDEPENDENT_AMBULATORY_CARE_PROVIDER_SITE_OTHER): Payer: Medicare Other | Admitting: Podiatry

## 2023-06-14 DIAGNOSIS — L97522 Non-pressure chronic ulcer of other part of left foot with fat layer exposed: Secondary | ICD-10-CM

## 2023-06-14 DIAGNOSIS — I739 Peripheral vascular disease, unspecified: Secondary | ICD-10-CM

## 2023-06-14 DIAGNOSIS — E1122 Type 2 diabetes mellitus with diabetic chronic kidney disease: Secondary | ICD-10-CM | POA: Diagnosis not present

## 2023-06-14 DIAGNOSIS — N1832 Chronic kidney disease, stage 3b: Secondary | ICD-10-CM

## 2023-06-14 DIAGNOSIS — Z794 Long term (current) use of insulin: Secondary | ICD-10-CM

## 2023-06-14 NOTE — Progress Notes (Signed)
 Subjective:  Patient ID: Justin [name], male    DOB: 03-20-1935,  MRN: [id number]   89 y.o. male presents for wound care.  Patient presents with follow-up of left third digit ulceration limited to the breakdown of the skin with exposure of bone.  He states is hurting him a lot he would like to discuss treatment options for this.  Review of Systems: Negative except as noted in the HPI. Denies N/V/F/Ch.  Past Medical History:  Diagnosis Date   Acute metabolic encephalopathy 05/24/2021   Arthritis    Diabetes mellitus    HTN (hypertension)    Hypercholesteremia    Iron deficiency    Pancreatitis, acute 06/02/2012   Diagnosed 05/2012    Personal history of colonic adenomas 04/15/2013   04/15/2013 2 diminutive polyps     Stroke ([name])    TIA (transient ischemic attack)     Current Outpatient Medications:    acetaminophen (TYLENOL) 325 MG tablet, Take 650 mg by mouth daily., Disp: , Rfl:    ascorbic acid (VITAMIN C) 500 MG tablet, Take 1 tablet (500 mg total) by mouth daily., Disp: 30 tablet, Rfl: 0   aspirin 81 MG chewable tablet, Chew 81 mg by mouth daily., Disp: , Rfl:    atorvastatin (LIPITOR) 40 MG tablet, Take 40 mg by mouth at bedtime., Disp: , Rfl:    BREO ELLIPTA 100-25 MCG/ACT AEPB, Inhale 1 puff into the lungs daily., Disp: , Rfl:    clopidogrel (PLAVIX) 75 MG tablet, Take 1 tablet (75 mg total) by mouth daily with breakfast., Disp: 30 tablet, Rfl: 3   divalproex (DEPAKOTE SPRINKLE) 125 MG capsule, Take 125 mg by mouth 2 (two) times daily., Disp: , Rfl:    docusate sodium (COLACE) 100 MG capsule, Take 100 mg by mouth 2 (two) times daily., Disp: , Rfl:    ferrous gluconate (FERGON) 324 MG tablet, Take 324 mg by mouth daily., Disp: , Rfl:    gabapentin (NEURONTIN) 100 MG capsule, Take 100 mg by mouth 2 (two) times daily., Disp: , Rfl:    glucagon (GLUCAGON EMERGENCY) 1 MG injection, Inject 1 mg into the muscle once as needed. (Patient taking differently: Inject 1 mg into the muscle  once as needed (for low blood sugar).), Disp: 1 each, Rfl: 12   hydrOXYzine (ATARAX) 10 MG tablet, Take 1 tablet (10 mg total) by mouth 2 (two) times daily as needed for anxiety or itching., Disp: 30 tablet, Rfl: 0   insulin lispro (HUMALOG) 100 UNIT/ML injection, Inject 0-12 Units into the skin 3 (three) times daily before meals. BS 70-200 give 0 units  BS 201-250 give 2 units  BS 251-300 give 4 units  BS 301-350 give 6 units  BS 351-400 give 8 units  BS 401-450 give 10 units  BS 451-600 give 12 units, Disp: , Rfl:    Ipratropium-Albuterol (COMBIVENT) 20-100 MCG/ACT AERS respimat, Inhale 1 puff into the lungs every 6 (six) hours as needed for wheezing. (Patient taking differently: Inhale 1 puff into the lungs every 6 (six) hours as needed (for COPD).), Disp: , Rfl:    isosorbide mononitrate (ISMO) 10 MG tablet, Take 10 mg by mouth at bedtime., Disp: , Rfl:    loratadine (CLARITIN) 10 MG tablet, Take 10 mg by mouth daily., Disp: , Rfl:    metoprolol tartrate (LOPRESSOR) 25 MG tablet, Take 0.5 tablets (12.5 mg total) by mouth 2 (two) times daily., Disp: 30 tablet, Rfl: 1   Multiple Vitamin (MULTIVITAMIN WITH MINERALS) TABS tablet,  Take 1 tablet by mouth daily., Disp: 30 tablet, Rfl: 0   oxyCODONE (OXY IR/ROXICODONE) 5 MG immediate release tablet, Take 5 mg by mouth every 8 (eight) hours as needed for severe pain (pain score 7-10)., Disp: , Rfl:    oxymetazoline (AFRIN) 0.05 % nasal spray, Place 2 sprays into both nostrils 2 (two) times daily as needed for congestion., Disp: , Rfl:    Pancrelipase, Lip-Prot-Amyl, (CREON) 24000-76000 units CPEP, Take 1 capsule by mouth 3 (three) times daily. With 4-6 ounces of water, Disp: , Rfl:    pantoprazole (PROTONIX) 40 MG tablet, Take 1 tablet (40 mg total) by mouth daily., Disp: , Rfl:    Pollen Extracts (PROSTAT PO), Take 30 mLs by mouth 3 (three) times daily. 17-100g-kcal/30 ml, Disp: , Rfl:    sertraline (ZOLOFT) 25 MG tablet, Take 25 mg by mouth daily., Disp: ,  Rfl:    Skin Protectants, Misc. (MINERIN CREME EX), Apply 1 Application topically at bedtime. Both legs and feet., Disp: , Rfl:    Sodium Chloride (NASAL MIST) 0.9 % AERS, Place 2 sprays into the nose in the morning and at bedtime., Disp: , Rfl:    Spacer/Aero-Holding Chambers DEVI, Use as directed with combivent. Ok to dispense any spacer that works with the inhaler being rxed to pt., Disp: 1 each, Rfl: 1   zinc sulfate, 50mg  elemental zinc, 220 (50 Zn) MG capsule, Take 1 capsule (220 mg total) by mouth daily., Disp: 30 capsule, Rfl: 0  Social History   Tobacco Use  Smoking Status Former   Current packs/day: 0.00   Types: Cigarettes   Start date: 04/10/1955   Quit date: 04/09/1990   Years since quitting: 33.2  Smokeless Tobacco Never    No Known Allergies Objective:  There were no vitals filed for this visit. There is no height or weight on file to calculate BMI. Constitutional Well developed. Well nourished.  Vascular Dorsalis pedis pulses palpable bilaterally. Posterior tibial pulses palpable bilaterally. Capillary refill normal to all digits.  No cyanosis or clubbing noted. Pedal hair growth normal.  Neurologic Normal speech. Oriented to person, place, and time. Protective sensation absent  Dermatologic Wound Location: Left third digit ulceration with fat layer exposed with no exposure of bone noted.  No purulent drainage noted.  No malodor present.  Exposure of bone noted adequate bleeding noted.  Fibrogranular wound bed. Wound Base: Mixed Granular/Fibrotic Peri-wound: Normal Exudate: Scant/small amount Serous exudate Wound Measurements: -See below  Orthopedic: No pain to palpation either foot.   Radiographs: IMPRESSION: 1. Cortical irregularity of the tuft of the first distal phalanx with marrow edema concerning for osteomyelitis. No drainable fluid collection to suggest an abscess.  However clinically patient does not have exposure of bone as he has granular wound  bed 2. Marrow edema in the medial hallux sesamoid as can be seen with sesamoiditis versus mild arthritic changes. 3. Moderate osteoarthritis of the first MTP joint. Mild osteoarthritis of the first TMT joint. Mild osteoarthritis of the talonavicular joint.   Assessment:   No diagnosis found.  Plan:  Patient was evaluated and treated and all questions answered.  Ulcer left third digit ulceration fat layer exposed -Given the findings of left third digit ulceration with exposure of bone at this time patient will benefit from surgical amputation of the third toe.  I discussed with the patient extensive detail he states understand would like to proceed with surgery to remove the third digit -Informed surgical risk consent was reviewed and read aloud  to the patient.  I reviewed the films.  I have discussed my findings with the patient in great detail.  I have discussed all risks including but not limited to infection, stiffness, scarring, limp, disability, deformity, damage to blood vessels and nerves, numbness, poor healing, need for braces, arthritis, chronic pain, amputation, death.  All benefits and realistic expectations discussed in great detail.  I have made no promises as to the outcome.  I have provided realistic expectations.  I have offered the patient a 2nd opinion, which they have declined and assured me they preferred to proceed despite the risks   No follow-ups on file.

## 2023-06-19 DIAGNOSIS — L97522 Non-pressure chronic ulcer of other part of left foot with fat layer exposed: Secondary | ICD-10-CM | POA: Diagnosis not present

## 2023-06-26 ENCOUNTER — Ambulatory Visit (INDEPENDENT_AMBULATORY_CARE_PROVIDER_SITE_OTHER): Admitting: Podiatry

## 2023-06-26 ENCOUNTER — Ambulatory Visit (INDEPENDENT_AMBULATORY_CARE_PROVIDER_SITE_OTHER)

## 2023-06-26 ENCOUNTER — Encounter: Payer: Self-pay | Admitting: Podiatry

## 2023-06-26 DIAGNOSIS — Z89422 Acquired absence of other left toe(s): Secondary | ICD-10-CM

## 2023-06-26 NOTE — Progress Notes (Signed)
 Subjective:  Patient ID: Justin [name], male    DOB: 06-Oct-1934,  MRN: [id number]  Chief Complaint  Patient presents with   Routine Post Op    Rm 11: POV #1 DOS 06/19/23 LEFT 3RD DIGIT AMPUTATION. No signs of infection at incision site. Pt denies pain.    DOS: 06/19/2023 Procedure: Left third digit amputation  89 y.o. male returns for post-op check.  Patient states that he is doing well denies any pain denies any other acute complaints ambulating in surgical shoe  Review of Systems: Negative except as noted in the HPI. Denies N/V/F/Ch.  Past Medical History:  Diagnosis Date   Acute metabolic encephalopathy 05/24/2021   Arthritis    Diabetes mellitus    HTN (hypertension)    Hypercholesteremia    Iron deficiency    Pancreatitis, acute 06/02/2012   Diagnosed 05/2012    Personal history of colonic adenomas 04/15/2013   04/15/2013 2 diminutive polyps     Stroke ([name])    TIA (transient ischemic attack)     Current Outpatient Medications:    acetaminophen (TYLENOL) 325 MG tablet, Take 650 mg by mouth daily., Disp: , Rfl:    ascorbic acid (VITAMIN C) 500 MG tablet, Take 1 tablet (500 mg total) by mouth daily., Disp: 30 tablet, Rfl: 0   aspirin 81 MG chewable tablet, Chew 81 mg by mouth daily., Disp: , Rfl:    atorvastatin (LIPITOR) 40 MG tablet, Take 40 mg by mouth at bedtime., Disp: , Rfl:    BREO ELLIPTA 100-25 MCG/ACT AEPB, Inhale 1 puff into the lungs daily., Disp: , Rfl:    clopidogrel (PLAVIX) 75 MG tablet, Take 1 tablet (75 mg total) by mouth daily with breakfast., Disp: 30 tablet, Rfl: 3   divalproex (DEPAKOTE SPRINKLE) 125 MG capsule, Take 125 mg by mouth 2 (two) times daily., Disp: , Rfl:    docusate sodium (COLACE) 100 MG capsule, Take 100 mg by mouth 2 (two) times daily., Disp: , Rfl:    ferrous gluconate (FERGON) 324 MG tablet, Take 324 mg by mouth daily., Disp: , Rfl:    gabapentin (NEURONTIN) 100 MG capsule, Take 100 mg by mouth 2 (two) times daily., Disp: , Rfl:     glucagon (GLUCAGON EMERGENCY) 1 MG injection, Inject 1 mg into the muscle once as needed. (Patient taking differently: Inject 1 mg into the muscle once as needed (for low blood sugar).), Disp: 1 each, Rfl: 12   hydrOXYzine (ATARAX) 10 MG tablet, Take 1 tablet (10 mg total) by mouth 2 (two) times daily as needed for anxiety or itching., Disp: 30 tablet, Rfl: 0   insulin lispro (HUMALOG) 100 UNIT/ML injection, Inject 0-12 Units into the skin 3 (three) times daily before meals. BS 70-200 give 0 units  BS 201-250 give 2 units  BS 251-300 give 4 units  BS 301-350 give 6 units  BS 351-400 give 8 units  BS 401-450 give 10 units  BS 451-600 give 12 units, Disp: , Rfl:    Ipratropium-Albuterol (COMBIVENT) 20-100 MCG/ACT AERS respimat, Inhale 1 puff into the lungs every 6 (six) hours as needed for wheezing. (Patient taking differently: Inhale 1 puff into the lungs every 6 (six) hours as needed (for COPD).), Disp: , Rfl:    isosorbide mononitrate (ISMO) 10 MG tablet, Take 10 mg by mouth at bedtime., Disp: , Rfl:    loratadine (CLARITIN) 10 MG tablet, Take 10 mg by mouth daily., Disp: , Rfl:    metoprolol tartrate (LOPRESSOR) 25 MG tablet,  Take 0.5 tablets (12.5 mg total) by mouth 2 (two) times daily., Disp: 30 tablet, Rfl: 1   Multiple Vitamin (MULTIVITAMIN WITH MINERALS) TABS tablet, Take 1 tablet by mouth daily., Disp: 30 tablet, Rfl: 0   oxyCODONE (OXY IR/ROXICODONE) 5 MG immediate release tablet, Take 5 mg by mouth every 8 (eight) hours as needed for severe pain (pain score 7-10)., Disp: , Rfl:    oxymetazoline (AFRIN) 0.05 % nasal spray, Place 2 sprays into both nostrils 2 (two) times daily as needed for congestion., Disp: , Rfl:    Pancrelipase, Lip-Prot-Amyl, (CREON) 24000-76000 units CPEP, Take 1 capsule by mouth 3 (three) times daily. With 4-6 ounces of water, Disp: , Rfl:    pantoprazole (PROTONIX) 40 MG tablet, Take 1 tablet (40 mg total) by mouth daily., Disp: , Rfl:    Pollen Extracts (PROSTAT PO),  Take 30 mLs by mouth 3 (three) times daily. 17-100g-kcal/30 ml, Disp: , Rfl:    sertraline (ZOLOFT) 25 MG tablet, Take 25 mg by mouth daily., Disp: , Rfl:    Skin Protectants, Misc. (MINERIN CREME EX), Apply 1 Application topically at bedtime. Both legs and feet., Disp: , Rfl:    Sodium Chloride (NASAL MIST) 0.9 % AERS, Place 2 sprays into the nose in the morning and at bedtime., Disp: , Rfl:    Spacer/Aero-Holding Chambers DEVI, Use as directed with combivent. Ok to dispense any spacer that works with the inhaler being rxed to pt., Disp: 1 each, Rfl: 1   zinc sulfate, 50mg  elemental zinc, 220 (50 Zn) MG capsule, Take 1 capsule (220 mg total) by mouth daily., Disp: 30 capsule, Rfl: 0  Social History   Tobacco Use  Smoking Status Former   Current packs/day: 0.00   Types: Cigarettes   Start date: 04/10/1955   Quit date: 04/09/1990   Years since quitting: 33.2  Smokeless Tobacco Never    No Known Allergies Objective:  There were no vitals filed for this visit. There is no height or weight on file to calculate BMI. Constitutional Well developed. Well nourished.  Vascular Foot warm and well perfused. Capillary refill normal to all digits.   Neurologic Normal speech. Oriented to person, place, and time. Epicritic sensation to light touch grossly present bilaterally.  Dermatologic Skin healing well without signs of infection. Skin edges well coapted without signs of infection.  Orthopedic: Tenderness to palpation noted about the surgical site.   Radiographs: 3 views of skeletally mature adult left foot: Status post third toe amputation noted.  No abnormalities noted no other acute problems Assessment:   1. Chronic toe ulcer, left, with fat layer exposed ([name])    Plan:  Patient was evaluated and treated and all questions answered.  S/p foot surgery left -Progressing as expected post-operatively. -XR: See above -WB Status: Weightbearing as tolerated in surgical shoe -Sutures: Intact.   No clinical signs of dehiscence noted no complication noted. -Medications: None -Foot redressed.  No follow-ups on file.

## 2023-07-03 ENCOUNTER — Ambulatory Visit (INDEPENDENT_AMBULATORY_CARE_PROVIDER_SITE_OTHER): Admitting: Podiatry

## 2023-07-03 DIAGNOSIS — T8130XA Disruption of wound, unspecified, initial encounter: Secondary | ICD-10-CM | POA: Diagnosis not present

## 2023-07-03 DIAGNOSIS — Z89422 Acquired absence of other left toe(s): Secondary | ICD-10-CM

## 2023-07-03 NOTE — Progress Notes (Signed)
 Subjective:  Patient ID: Justin [name], male    DOB: 06-11-1934,  MRN: [id number]  Chief Complaint  Patient presents with   Routine Post Op    DOS 06/19/23 LEFT 3RD DIGIT AMPUTATION Bleeding from post op site,looks like the incision site is trying to open     DOS: 06/19/2023 Procedure: Left third digit amputation  89 y.o. male returns for post-op check.  Patient states he is doing okay.  His third toe wound is getting worse denies any other acute complaints  Review of Systems: Negative except as noted in the HPI. Denies N/V/F/Ch.  Past Medical History:  Diagnosis Date   Acute metabolic encephalopathy 05/24/2021   Arthritis    Diabetes mellitus    HTN (hypertension)    Hypercholesteremia    Iron deficiency    Pancreatitis, acute 06/02/2012   Diagnosed 05/2012    Personal history of colonic adenomas 04/15/2013   04/15/2013 2 diminutive polyps     Stroke ([name])    TIA (transient ischemic attack)     Current Outpatient Medications:    acetaminophen (TYLENOL) 325 MG tablet, Take 650 mg by mouth daily., Disp: , Rfl:    ascorbic acid (VITAMIN C) 500 MG tablet, Take 1 tablet (500 mg total) by mouth daily., Disp: 30 tablet, Rfl: 0   aspirin 81 MG chewable tablet, Chew 81 mg by mouth daily., Disp: , Rfl:    atorvastatin (LIPITOR) 40 MG tablet, Take 40 mg by mouth at bedtime., Disp: , Rfl:    BREO ELLIPTA 100-25 MCG/ACT AEPB, Inhale 1 puff into the lungs daily., Disp: , Rfl:    clopidogrel (PLAVIX) 75 MG tablet, Take 1 tablet (75 mg total) by mouth daily with breakfast., Disp: 30 tablet, Rfl: 3   divalproex (DEPAKOTE SPRINKLE) 125 MG capsule, Take 125 mg by mouth 2 (two) times daily., Disp: , Rfl:    docusate sodium (COLACE) 100 MG capsule, Take 100 mg by mouth 2 (two) times daily., Disp: , Rfl:    ferrous gluconate (FERGON) 324 MG tablet, Take 324 mg by mouth daily., Disp: , Rfl:    gabapentin (NEURONTIN) 100 MG capsule, Take 100 mg by mouth 2 (two) times daily., Disp: , Rfl:    glucagon  (GLUCAGON EMERGENCY) 1 MG injection, Inject 1 mg into the muscle once as needed. (Patient taking differently: Inject 1 mg into the muscle once as needed (for low blood sugar).), Disp: 1 each, Rfl: 12   hydrOXYzine (ATARAX) 10 MG tablet, Take 1 tablet (10 mg total) by mouth 2 (two) times daily as needed for anxiety or itching., Disp: 30 tablet, Rfl: 0   insulin lispro (HUMALOG) 100 UNIT/ML injection, Inject 0-12 Units into the skin 3 (three) times daily before meals. BS 70-200 give 0 units  BS 201-250 give 2 units  BS 251-300 give 4 units  BS 301-350 give 6 units  BS 351-400 give 8 units  BS 401-450 give 10 units  BS 451-600 give 12 units, Disp: , Rfl:    Ipratropium-Albuterol (COMBIVENT) 20-100 MCG/ACT AERS respimat, Inhale 1 puff into the lungs every 6 (six) hours as needed for wheezing. (Patient taking differently: Inhale 1 puff into the lungs every 6 (six) hours as needed (for COPD).), Disp: , Rfl:    isosorbide mononitrate (ISMO) 10 MG tablet, Take 10 mg by mouth at bedtime., Disp: , Rfl:    loratadine (CLARITIN) 10 MG tablet, Take 10 mg by mouth daily., Disp: , Rfl:    metoprolol tartrate (LOPRESSOR) 25 MG tablet,  Take 0.5 tablets (12.5 mg total) by mouth 2 (two) times daily., Disp: 30 tablet, Rfl: 1   Multiple Vitamin (MULTIVITAMIN WITH MINERALS) TABS tablet, Take 1 tablet by mouth daily., Disp: 30 tablet, Rfl: 0   oxyCODONE (OXY IR/ROXICODONE) 5 MG immediate release tablet, Take 5 mg by mouth every 8 (eight) hours as needed for severe pain (pain score 7-10)., Disp: , Rfl:    oxymetazoline (AFRIN) 0.05 % nasal spray, Place 2 sprays into both nostrils 2 (two) times daily as needed for congestion., Disp: , Rfl:    Pancrelipase, Lip-Prot-Amyl, (CREON) 24000-76000 units CPEP, Take 1 capsule by mouth 3 (three) times daily. With 4-6 ounces of water, Disp: , Rfl:    pantoprazole (PROTONIX) 40 MG tablet, Take 1 tablet (40 mg total) by mouth daily., Disp: , Rfl:    Pollen Extracts (PROSTAT PO), Take 30 mLs  by mouth 3 (three) times daily. 17-100g-kcal/30 ml, Disp: , Rfl:    sertraline (ZOLOFT) 25 MG tablet, Take 25 mg by mouth daily., Disp: , Rfl:    Skin Protectants, Misc. (MINERIN CREME EX), Apply 1 Application topically at bedtime. Both legs and feet., Disp: , Rfl:    Sodium Chloride (NASAL MIST) 0.9 % AERS, Place 2 sprays into the nose in the morning and at bedtime., Disp: , Rfl:    Spacer/Aero-Holding Chambers DEVI, Use as directed with combivent. Ok to dispense any spacer that works with the inhaler being rxed to pt., Disp: 1 each, Rfl: 1   zinc sulfate, 50mg  elemental zinc, 220 (50 Zn) MG capsule, Take 1 capsule (220 mg total) by mouth daily., Disp: 30 capsule, Rfl: 0  Social History   Tobacco Use  Smoking Status Former   Current packs/day: 0.00   Types: Cigarettes   Start date: 04/10/1955   Quit date: 04/09/1990   Years since quitting: 33.2  Smokeless Tobacco Never    No Known Allergies Objective:  There were no vitals filed for this visit. There is no height or weight on file to calculate BMI. Constitutional Well developed. Well nourished.  Vascular Foot warm and well perfused. Capillary refill normal to all digits.   Neurologic Normal speech. Oriented to person, place, and time. Epicritic sensation to light touch grossly present bilaterally.  Dermatologic Left third digit amputation site wound probes down to deep tissue/bone.  No purulent drainage noted no signs of infection noted  Orthopedic: Tenderness to palpation noted about the surgical site.   Radiographs: 3 views of skeletally mature adult left foot: Status post third toe amputation noted.  No abnormalities noted no other acute problems Assessment:   1. Dehiscence of wound    Plan:  Patient was evaluated and treated and all questions answered.  Left third digit amputation site dehiscence -At this time the left third digit is regressing I discussed with the patient extensive detail that he may benefit from the wound  care center to help with closing of the wound I discussed with patient extensive detail he states understanding like to proceed with the wound care center -For now continue Betadine wet-to-dry dressing  No follow-ups on file.

## 2023-07-10 ENCOUNTER — Telehealth: Payer: Self-pay | Admitting: Podiatry

## 2023-07-10 ENCOUNTER — Ambulatory Visit: Admitting: Podiatry

## 2023-07-10 DIAGNOSIS — T8130XA Disruption of wound, unspecified, initial encounter: Secondary | ICD-10-CM

## 2023-07-10 DIAGNOSIS — Z89422 Acquired absence of other left toe(s): Secondary | ICD-10-CM

## 2023-07-10 NOTE — Progress Notes (Signed)
 Subjective:  Patient ID: Justin [name], male    DOB: Jan 07, 1935,  MRN: [id number]  Chief Complaint  Patient presents with   Routine Post Op    DOS 06/19/23 LEFT 3RD DIGIT AMPUTATION    DOS: 06/19/2023 Procedure: Left third digit amputation  89 y.o. male returns for post-op check.  Patient states he is doing okay.  His third toe wound is getting worse denies any other acute complaints  Review of Systems: Negative except as noted in the HPI. Denies N/V/F/Ch.  Past Medical History:  Diagnosis Date   Acute metabolic encephalopathy 05/24/2021   Arthritis    Diabetes mellitus    HTN (hypertension)    Hypercholesteremia    Iron deficiency    Pancreatitis, acute 06/02/2012   Diagnosed 05/2012    Personal history of colonic adenomas 04/15/2013   04/15/2013 2 diminutive polyps     Stroke ([name])    TIA (transient ischemic attack)    No current facility-administered medications for this visit. No current outpatient medications on file.  Facility-Administered Medications Ordered in Other Visits:    acetaminophen (TYLENOL) tablet 650 mg, 650 mg, Oral, Q6H PRN, 650 mg at 07/20/23 1041 **OR** acetaminophen (TYLENOL) suppository 650 mg, 650 mg, Rectal, Q6H PRN, Sundil, Subrina, MD   aspirin chewable tablet 81 mg, 81 mg, Oral, Daily, Sundil, Subrina, MD, 81 mg at 07/22/23 0823   atorvastatin (LIPITOR) tablet 40 mg, 40 mg, Oral, QHS, Sundil, Subrina, MD, 40 mg at 07/21/23 2159   ceFEPIme (MAXIPIME) 2 g in sodium chloride 0.9 % 100 mL IVPB, 2 g, Intravenous, Q12H, Alfonse Angle, Benuel Brazier, MD, Last Rate: 200 mL/hr at 07/23/23 1040, 2 g at 07/23/23 1040   divalproex (DEPAKOTE SPRINKLE) capsule 125 mg, 125 mg, Oral, BID, Sundil, Subrina, MD, 125 mg at 07/22/23 0824   ferrous gluconate (FERGON) tablet 324 mg, 324 mg, Oral, Daily, Sundil, Subrina, MD, 324 mg at 07/22/23 0822   fluticasone furoate-vilanterol (BREO ELLIPTA) 100-25 MCG/ACT 1 puff, 1 puff, Inhalation, Daily, Amin, Ankit C, MD, 1 puff at 07/22/23 0824    gabapentin (NEURONTIN) capsule 100 mg, 100 mg, Oral, BID, Sundil, Subrina, MD, 100 mg at 07/22/23 5621   glucagon (human recombinant) (GLUCAGEN) injection 1 mg, 1 mg, Intravenous, PRN, Amin, Ankit C, MD   guaiFENesin (ROBITUSSIN) 100 MG/5ML liquid 5 mL, 5 mL, Oral, Q4H PRN, Amin, Ankit C, MD   heparin injection 5,000 Units, 5,000 Units, Subcutaneous, Q8H, Sundil, Subrina, MD, 5,000 Units at 07/23/23 0518   hydrALAZINE (APRESOLINE) injection 10 mg, 10 mg, Intravenous, Q4H PRN, Amin, Ankit C, MD   hydrOXYzine (ATARAX) tablet 10 mg, 10 mg, Oral, BID PRN, Sundil, Subrina, MD   insulin aspart (novoLOG) injection 0-5 Units, 0-5 Units, Subcutaneous, QHS, Sundil, Subrina, MD   insulin aspart (novoLOG) injection 0-6 Units, 0-6 Units, Subcutaneous, TID WC, Sundil, Subrina, MD, 1 Units at 07/22/23 1715   insulin glargine-yfgn (SEMGLEE) injection 10 Units, 10 Units, Subcutaneous, Daily, Amin, Ankit C, MD, 10 Units at 07/21/23 0910   ipratropium-albuterol (DUONEB) 0.5-2.5 (3) MG/3ML nebulizer solution 3 mL, 3 mL, Nebulization, Q4H PRN, Amin, Ankit C, MD   linezolid (ZYVOX) tablet 600 mg, 600 mg, Oral, Q12H, Amin, Ankit C, MD, 600 mg at 07/22/23 2154   lipase/protease/amylase (CREON) capsule 24,000 Units, 24,000 Units, Oral, TID, Sundil, Subrina, MD, 24,000 Units at 07/22/23 1554   metoprolol tartrate (LOPRESSOR) injection 5 mg, 5 mg, Intravenous, Q4H PRN, Amin, Ankit C, MD   ondansetron (ZOFRAN) tablet 4 mg, 4 mg, Oral, Q6H PRN **  OR** ondansetron (ZOFRAN) injection 4 mg, 4 mg, Intravenous, Q6H PRN, Sundil, Subrina, MD   pantoprazole (PROTONIX) EC tablet 40 mg, 40 mg, Oral, Daily, Sundil, Subrina, MD, 40 mg at 07/22/23 4098   polyethylene glycol (MIRALAX / GLYCOLAX) packet 17 g, 17 g, Oral, BID, Sundil, Subrina, MD, 17 g at 07/22/23 2152   senna-docusate (Senokot-S) tablet 1 tablet, 1 tablet, Oral, BID, Sundil, Subrina, MD, 1 tablet at 07/22/23 0823   senna-docusate (Senokot-S) tablet 1 tablet, 1 tablet, Oral,  QHS PRN, Amin, Ankit C, MD, 1 tablet at 07/20/23 2207   sertraline (ZOLOFT) tablet 25 mg, 25 mg, Oral, Daily, Sundil, Subrina, MD, 25 mg at 07/22/23 0825   sodium bicarbonate tablet 650 mg, 650 mg, Oral, BID, Sundil, Subrina, MD, 650 mg at 07/22/23 0825   sodium chloride flush (NS) 0.9 % injection 3 mL, 3 mL, Intravenous, Q12H, Sundil, Subrina, MD, 3 mL at 07/23/23 1028   sodium chloride flush (NS) 0.9 % injection 3 mL, 3 mL, Intravenous, Q12H, Sundil, Subrina, MD, 3 mL at 07/23/23 1028   sodium chloride flush (NS) 0.9 % injection 3 mL, 3 mL, Intravenous, PRN, Sundil, Subrina, MD  Social History   Tobacco Use  Smoking Status Former   Current packs/day: 0.00   Types: Cigarettes   Start date: 04/10/1955   Quit date: 04/09/1990   Years since quitting: 33.3  Smokeless Tobacco Never    No Known Allergies Objective:  There were no vitals filed for this visit. There is no height or weight on file to calculate BMI. Constitutional Well developed. Well nourished.  Vascular Foot warm and well perfused. Capillary refill normal to all digits.   Neurologic Normal speech. Oriented to person, place, and time. Epicritic sensation to light touch grossly present bilaterally.  Dermatologic Left third digit amputation site wound probes down to deep tissue/bone.  No purulent drainage noted no signs of infection noted  Orthopedic: Tenderness to palpation noted about the surgical site.   Radiographs: 3 views of skeletally mature adult left foot: Status post third toe amputation noted.  No abnormalities noted no other acute problems Assessment:   No diagnosis found.  Plan:  Patient was evaluated and treated and all questions answered.  Left third digit amputation site dehiscence -At this time the left third digit is regressing I discussed with the patient extensive detail -referrals were sent for patient to go to the wound care center - Patient's third digit wound is regressing patient is a high risk  of undergoing transmetatarsal amputation.  If it continues to regress we will discuss this at next visit.  I discussed with patient and his caretaker they both agree with the plan - Will continue Betadine wet-to-dry dressing No follow-ups on file.

## 2023-07-10 NOTE — Telephone Encounter (Signed)
 Please call Victorino Dike SParks at [facility] regarding the wound on patient and the family dynamics that goes on with patient.  Victorino Dike is the wound nurse and needs to speak with you directly.  Patient has an appointment today.  Jennifer's number is [phone].

## 2023-07-12 ENCOUNTER — Telehealth: Payer: Self-pay | Admitting: Podiatry

## 2023-07-12 ENCOUNTER — Other Ambulatory Visit: Payer: Self-pay | Admitting: Podiatry

## 2023-07-12 NOTE — Telephone Encounter (Signed)
 Received a voicemail from pts son stating he needed to talk to Dr Allena Katz as he is giving Dr Allena Katz permission to cut his foot off. He is pts poa and we have copy on file. He then said he is out of town and may not be able to answer the phone. But he is giving Dr Allena Katz permission to do surgery on dad and cut all toes of.

## 2023-07-12 NOTE — Telephone Encounter (Addendum)
 Jessica with [facility] called regarding patient's toe. She stated that the treatment nurse saw patient today and is concerned that patient may have infection in toe and that it is moving to his 2nd toe. 2nd toe has visible wound on knuckle of ttoe which is raised and red. She didn't know if patient needed to be seen ooner than his appointment on 4/16. She would like script faxed to [facility] at [phone] or [phone] to her attention.  Jessica's number [phone]

## 2023-07-17 ENCOUNTER — Encounter (HOSPITAL_COMMUNITY): Payer: Self-pay | Admitting: Pharmacy Technician

## 2023-07-17 ENCOUNTER — Emergency Department (HOSPITAL_COMMUNITY)

## 2023-07-17 ENCOUNTER — Inpatient Hospital Stay (HOSPITAL_COMMUNITY)
Admission: EM | Admit: 2023-07-17 | Discharge: 2023-07-25 | DRG: 872 | Disposition: A | Discharge status: Hospice | Source: Skilled Nursing Facility | Attending: Internal Medicine | Admitting: Internal Medicine

## 2023-07-17 ENCOUNTER — Other Ambulatory Visit: Payer: Self-pay

## 2023-07-17 DIAGNOSIS — N179 Acute kidney failure, unspecified: Secondary | ICD-10-CM | POA: Diagnosis present

## 2023-07-17 DIAGNOSIS — Z8673 Personal history of transient ischemic attack (TIA), and cerebral infarction without residual deficits: Secondary | ICD-10-CM

## 2023-07-17 DIAGNOSIS — D638 Anemia in other chronic diseases classified elsewhere: Secondary | ICD-10-CM | POA: Diagnosis not present

## 2023-07-17 DIAGNOSIS — D631 Anemia in chronic kidney disease: Secondary | ICD-10-CM | POA: Diagnosis present

## 2023-07-17 DIAGNOSIS — E11621 Type 2 diabetes mellitus with foot ulcer: Secondary | ICD-10-CM | POA: Diagnosis present

## 2023-07-17 DIAGNOSIS — Z7982 Long term (current) use of aspirin: Secondary | ICD-10-CM

## 2023-07-17 DIAGNOSIS — E8809 Other disorders of plasma-protein metabolism, not elsewhere classified: Secondary | ICD-10-CM | POA: Diagnosis present

## 2023-07-17 DIAGNOSIS — E119 Type 2 diabetes mellitus without complications: Secondary | ICD-10-CM | POA: Diagnosis not present

## 2023-07-17 DIAGNOSIS — Z7984 Long term (current) use of oral hypoglycemic drugs: Secondary | ICD-10-CM

## 2023-07-17 DIAGNOSIS — A419 Sepsis, unspecified organism: Secondary | ICD-10-CM | POA: Diagnosis present

## 2023-07-17 DIAGNOSIS — R64 Cachexia: Secondary | ICD-10-CM | POA: Diagnosis present

## 2023-07-17 DIAGNOSIS — J45909 Unspecified asthma, uncomplicated: Secondary | ICD-10-CM | POA: Insufficient documentation

## 2023-07-17 DIAGNOSIS — R112 Nausea with vomiting, unspecified: Secondary | ICD-10-CM | POA: Diagnosis present

## 2023-07-17 DIAGNOSIS — N1832 Chronic kidney disease, stage 3b: Secondary | ICD-10-CM | POA: Diagnosis present

## 2023-07-17 DIAGNOSIS — J4520 Mild intermittent asthma, uncomplicated: Secondary | ICD-10-CM

## 2023-07-17 DIAGNOSIS — Z681 Body mass index (BMI) 19 or less, adult: Secondary | ICD-10-CM

## 2023-07-17 DIAGNOSIS — J9811 Atelectasis: Secondary | ICD-10-CM | POA: Diagnosis present

## 2023-07-17 DIAGNOSIS — Z7189 Other specified counseling: Secondary | ICD-10-CM | POA: Diagnosis not present

## 2023-07-17 DIAGNOSIS — L97526 Non-pressure chronic ulcer of other part of left foot with bone involvement without evidence of necrosis: Secondary | ICD-10-CM | POA: Diagnosis present

## 2023-07-17 DIAGNOSIS — Z9049 Acquired absence of other specified parts of digestive tract: Secondary | ICD-10-CM

## 2023-07-17 DIAGNOSIS — Z66 Do not resuscitate: Secondary | ICD-10-CM | POA: Diagnosis present

## 2023-07-17 DIAGNOSIS — D508 Other iron deficiency anemias: Secondary | ICD-10-CM | POA: Diagnosis not present

## 2023-07-17 DIAGNOSIS — Z515 Encounter for palliative care: Secondary | ICD-10-CM | POA: Diagnosis not present

## 2023-07-17 DIAGNOSIS — E46 Unspecified protein-calorie malnutrition: Secondary | ICD-10-CM | POA: Diagnosis present

## 2023-07-17 DIAGNOSIS — I10 Essential (primary) hypertension: Secondary | ICD-10-CM | POA: Diagnosis not present

## 2023-07-17 DIAGNOSIS — Z89422 Acquired absence of other left toe(s): Secondary | ICD-10-CM

## 2023-07-17 DIAGNOSIS — F0150 Vascular dementia without behavioral disturbance: Secondary | ICD-10-CM | POA: Diagnosis present

## 2023-07-17 DIAGNOSIS — Z8249 Family history of ischemic heart disease and other diseases of the circulatory system: Secondary | ICD-10-CM

## 2023-07-17 DIAGNOSIS — Z1152 Encounter for screening for COVID-19: Secondary | ICD-10-CM

## 2023-07-17 DIAGNOSIS — E872 Acidosis, unspecified: Secondary | ICD-10-CM | POA: Diagnosis present

## 2023-07-17 DIAGNOSIS — Z860101 Personal history of adenomatous and serrated colon polyps: Secondary | ICD-10-CM

## 2023-07-17 DIAGNOSIS — E7800 Pure hypercholesterolemia, unspecified: Secondary | ICD-10-CM | POA: Diagnosis present

## 2023-07-17 DIAGNOSIS — Z89421 Acquired absence of other right toe(s): Secondary | ICD-10-CM

## 2023-07-17 DIAGNOSIS — F0390 Unspecified dementia without behavioral disturbance: Secondary | ICD-10-CM | POA: Diagnosis not present

## 2023-07-17 DIAGNOSIS — E1169 Type 2 diabetes mellitus with other specified complication: Secondary | ICD-10-CM | POA: Diagnosis present

## 2023-07-17 DIAGNOSIS — I70262 Atherosclerosis of native arteries of extremities with gangrene, left leg: Secondary | ICD-10-CM | POA: Diagnosis present

## 2023-07-17 DIAGNOSIS — Z89431 Acquired absence of right foot: Secondary | ICD-10-CM

## 2023-07-17 DIAGNOSIS — I129 Hypertensive chronic kidney disease with stage 1 through stage 4 chronic kidney disease, or unspecified chronic kidney disease: Secondary | ICD-10-CM | POA: Diagnosis present

## 2023-07-17 DIAGNOSIS — I96 Gangrene, not elsewhere classified: Secondary | ICD-10-CM | POA: Diagnosis not present

## 2023-07-17 DIAGNOSIS — N390 Urinary tract infection, site not specified: Secondary | ICD-10-CM | POA: Diagnosis present

## 2023-07-17 DIAGNOSIS — K861 Other chronic pancreatitis: Secondary | ICD-10-CM | POA: Diagnosis present

## 2023-07-17 DIAGNOSIS — Z833 Family history of diabetes mellitus: Secondary | ICD-10-CM

## 2023-07-17 DIAGNOSIS — I69351 Hemiplegia and hemiparesis following cerebral infarction affecting right dominant side: Secondary | ICD-10-CM

## 2023-07-17 DIAGNOSIS — E1122 Type 2 diabetes mellitus with diabetic chronic kidney disease: Secondary | ICD-10-CM | POA: Diagnosis present

## 2023-07-17 DIAGNOSIS — M869 Osteomyelitis, unspecified: Secondary | ICD-10-CM | POA: Diagnosis present

## 2023-07-17 DIAGNOSIS — Z87891 Personal history of nicotine dependence: Secondary | ICD-10-CM

## 2023-07-17 DIAGNOSIS — M8600 Acute hematogenous osteomyelitis, unspecified site: Secondary | ICD-10-CM | POA: Diagnosis not present

## 2023-07-17 DIAGNOSIS — D509 Iron deficiency anemia, unspecified: Secondary | ICD-10-CM | POA: Insufficient documentation

## 2023-07-17 DIAGNOSIS — F0280 Dementia in other diseases classified elsewhere without behavioral disturbance: Secondary | ICD-10-CM | POA: Diagnosis present

## 2023-07-17 DIAGNOSIS — N189 Chronic kidney disease, unspecified: Secondary | ICD-10-CM | POA: Diagnosis not present

## 2023-07-17 DIAGNOSIS — E1152 Type 2 diabetes mellitus with diabetic peripheral angiopathy with gangrene: Secondary | ICD-10-CM | POA: Diagnosis present

## 2023-07-17 DIAGNOSIS — K5909 Other constipation: Secondary | ICD-10-CM | POA: Diagnosis present

## 2023-07-17 DIAGNOSIS — K529 Noninfective gastroenteritis and colitis, unspecified: Secondary | ICD-10-CM | POA: Diagnosis present

## 2023-07-17 DIAGNOSIS — Z7951 Long term (current) use of inhaled steroids: Secondary | ICD-10-CM

## 2023-07-17 DIAGNOSIS — Z8659 Personal history of other mental and behavioral disorders: Secondary | ICD-10-CM

## 2023-07-17 DIAGNOSIS — I70201 Unspecified atherosclerosis of native arteries of extremities, right leg: Secondary | ICD-10-CM | POA: Diagnosis present

## 2023-07-17 DIAGNOSIS — Z7902 Long term (current) use of antithrombotics/antiplatelets: Secondary | ICD-10-CM

## 2023-07-17 DIAGNOSIS — G309 Alzheimer's disease, unspecified: Secondary | ICD-10-CM | POA: Diagnosis present

## 2023-07-17 DIAGNOSIS — I959 Hypotension, unspecified: Secondary | ICD-10-CM | POA: Diagnosis present

## 2023-07-17 DIAGNOSIS — Z79899 Other long term (current) drug therapy: Secondary | ICD-10-CM

## 2023-07-17 DIAGNOSIS — I7090 Unspecified atherosclerosis: Secondary | ICD-10-CM | POA: Diagnosis not present

## 2023-07-17 DIAGNOSIS — Z794 Long term (current) use of insulin: Secondary | ICD-10-CM

## 2023-07-17 LAB — CBC WITH DIFFERENTIAL/PLATELET
Abs Immature Granulocytes: 0.72 10*3/uL — ABNORMAL HIGH (ref 0.00–0.07)
Basophils Absolute: 0.2 10*3/uL — ABNORMAL HIGH (ref 0.0–0.1)
Basophils Relative: 1 %
Eosinophils Absolute: 0 10*3/uL (ref 0.0–0.5)
Eosinophils Relative: 0 %
HCT: 40.2 % (ref 39.0–52.0)
Hemoglobin: 12.5 g/dL — ABNORMAL LOW (ref 13.0–17.0)
Immature Granulocytes: 4 %
Lymphocytes Relative: 9 %
Lymphs Abs: 1.8 10*3/uL (ref 0.7–4.0)
MCH: 30.9 pg (ref 26.0–34.0)
MCHC: 31.1 g/dL (ref 30.0–36.0)
MCV: 99.3 fL (ref 80.0–100.0)
Monocytes Absolute: 0.8 10*3/uL (ref 0.1–1.0)
Monocytes Relative: 4 %
Neutro Abs: 17.3 10*3/uL — ABNORMAL HIGH (ref 1.7–7.7)
Neutrophils Relative %: 82 %
Platelets: 408 10*3/uL — ABNORMAL HIGH (ref 150–400)
RBC: 4.05 MIL/uL — ABNORMAL LOW (ref 4.22–5.81)
RDW: 13.8 % (ref 11.5–15.5)
WBC: 20.7 10*3/uL — ABNORMAL HIGH (ref 4.0–10.5)
nRBC: 0 % (ref 0.0–0.2)

## 2023-07-17 LAB — COMPREHENSIVE METABOLIC PANEL WITH GFR
ALT: 29 U/L (ref 0–44)
AST: 31 U/L (ref 15–41)
Albumin: 2.3 g/dL — ABNORMAL LOW (ref 3.5–5.0)
Alkaline Phosphatase: 80 U/L (ref 38–126)
Anion gap: 17 — ABNORMAL HIGH (ref 5–15)
BUN: 50 mg/dL — ABNORMAL HIGH (ref 8–23)
CO2: 15 mmol/L — ABNORMAL LOW (ref 22–32)
Calcium: 8.6 mg/dL — ABNORMAL LOW (ref 8.9–10.3)
Chloride: 103 mmol/L (ref 98–111)
Creatinine, Ser: 2.57 mg/dL — ABNORMAL HIGH (ref 0.61–1.24)
GFR, Estimated: 24 mL/min — ABNORMAL LOW (ref >60)
Glucose, Bld: 244 mg/dL — ABNORMAL HIGH (ref 70–99)
Potassium: 5 mmol/L (ref 3.5–5.1)
Sodium: 135 mmol/L (ref 135–145)
Total Bilirubin: 0.8 mg/dL (ref 0.0–1.2)
Total Protein: 8.9 g/dL — ABNORMAL HIGH (ref 6.5–8.1)

## 2023-07-17 LAB — I-STAT CG4 LACTIC ACID, ED: Lactic Acid, Venous: 2.1 mmol/L (ref 0.5–1.9)

## 2023-07-17 LAB — PROTIME-INR
INR: 1.6 — ABNORMAL HIGH (ref 0.8–1.2)
Prothrombin Time: 19.2 s — ABNORMAL HIGH (ref 11.4–15.2)

## 2023-07-17 MED ORDER — IPRATROPIUM-ALBUTEROL 20-100 MCG/ACT IN AERS: 1.0000 | INHALATION_SPRAY | Freq: Four times a day (QID) | RESPIRATORY_TRACT | Status: DC | PRN

## 2023-07-17 MED ORDER — FLUTICASONE FUROATE-VILANTEROL 100-25 MCG/ACT IN AEPB
1.0000 | INHALATION_SPRAY | Freq: Every day | RESPIRATORY_TRACT | Status: DC
  Administered 2023-07-18 – 2023-07-19 (×2): 1 via RESPIRATORY_TRACT
  Filled 2023-07-17: qty 28

## 2023-07-17 MED ORDER — INSULIN ASPART 100 UNIT/ML IJ SOLN: 0.0000 [IU] | Freq: Every day | INTRAMUSCULAR | Status: DC

## 2023-07-17 MED ORDER — SODIUM CHLORIDE 0.9 % IV SOLN
2.0000 g | Freq: Once | INTRAVENOUS | Status: AC
  Administered 2023-07-17: 2 g via INTRAVENOUS
  Filled 2023-07-17: qty 12.5

## 2023-07-17 MED ORDER — LACTATED RINGERS IV SOLN: INTRAVENOUS | Status: DC

## 2023-07-17 MED ORDER — ACETAMINOPHEN 325 MG PO TABS
650.0000 mg | ORAL_TABLET | Freq: Four times a day (QID) | ORAL | Status: DC | PRN
  Administered 2023-07-18 – 2023-07-20 (×3): 650 mg via ORAL
  Filled 2023-07-17 (×3): qty 2

## 2023-07-17 MED ORDER — VANCOMYCIN HCL IN DEXTROSE 1-5 GM/200ML-% IV SOLN
1000.0000 mg | Freq: Once | INTRAVENOUS | Status: AC
  Administered 2023-07-17: 1000 mg via INTRAVENOUS
  Filled 2023-07-17: qty 200

## 2023-07-17 MED ORDER — GABAPENTIN 100 MG PO CAPS
100.0000 mg | ORAL_CAPSULE | Freq: Two times a day (BID) | ORAL | Status: DC
  Administered 2023-07-18 – 2023-07-22 (×9): 100 mg via ORAL
  Filled 2023-07-17 (×13): qty 1

## 2023-07-17 MED ORDER — SODIUM CHLORIDE 0.9 % IV SOLN
2.0000 g | INTRAVENOUS | Status: DC
  Administered 2023-07-18 – 2023-07-19 (×2): 2 g via INTRAVENOUS
  Filled 2023-07-17 (×2): qty 12.5

## 2023-07-17 MED ORDER — SODIUM CHLORIDE 0.9 % IV SOLN: 250.0000 mL | INTRAVENOUS | Status: AC | PRN

## 2023-07-17 MED ORDER — PANTOPRAZOLE SODIUM 40 MG PO TBEC
40.0000 mg | DELAYED_RELEASE_TABLET | Freq: Every day | ORAL | Status: DC
  Administered 2023-07-18 – 2023-07-22 (×5): 40 mg via ORAL
  Filled 2023-07-17 (×7): qty 1

## 2023-07-17 MED ORDER — SERTRALINE HCL 50 MG PO TABS
25.0000 mg | ORAL_TABLET | Freq: Every day | ORAL | Status: DC
  Administered 2023-07-18 – 2023-07-22 (×5): 25 mg via ORAL
  Filled 2023-07-17 (×7): qty 1

## 2023-07-17 MED ORDER — HYDROXYZINE HCL 10 MG PO TABS
10.0000 mg | ORAL_TABLET | Freq: Two times a day (BID) | ORAL | Status: DC | PRN
  Filled 2023-07-17 (×2): qty 1

## 2023-07-17 MED ORDER — ACETAMINOPHEN 650 MG RE SUPP: 650.0000 mg | Freq: Four times a day (QID) | RECTAL | Status: DC | PRN

## 2023-07-17 MED ORDER — ONDANSETRON HCL 4 MG PO TABS: 4.0000 mg | ORAL_TABLET | Freq: Four times a day (QID) | ORAL | Status: DC | PRN

## 2023-07-17 MED ORDER — LACTATED RINGERS IV BOLUS
1000.0000 mL | Freq: Once | INTRAVENOUS | Status: AC
  Administered 2023-07-17: 1000 mL via INTRAVENOUS

## 2023-07-17 MED ORDER — ONDANSETRON HCL 4 MG/2ML IJ SOLN: 4.0000 mg | Freq: Four times a day (QID) | INTRAMUSCULAR | Status: DC | PRN

## 2023-07-17 MED ORDER — SODIUM CHLORIDE 0.9% FLUSH
3.0000 mL | Freq: Two times a day (BID) | INTRAVENOUS | Status: DC
  Administered 2023-07-20 – 2023-07-24 (×6): 3 mL via INTRAVENOUS

## 2023-07-17 MED ORDER — SODIUM CHLORIDE 0.9% FLUSH
3.0000 mL | Freq: Two times a day (BID) | INTRAVENOUS | Status: DC
  Administered 2023-07-18 – 2023-07-24 (×8): 3 mL via INTRAVENOUS

## 2023-07-17 MED ORDER — FERROUS GLUCONATE 324 (38 FE) MG PO TABS
324.0000 mg | ORAL_TABLET | Freq: Every day | ORAL | Status: DC
  Administered 2023-07-18 – 2023-07-22 (×5): 324 mg via ORAL
  Filled 2023-07-17 (×7): qty 1

## 2023-07-17 MED ORDER — METOPROLOL TARTRATE 25 MG PO TABS: 12.5000 mg | ORAL_TABLET | Freq: Two times a day (BID) | ORAL | Status: DC

## 2023-07-17 MED ORDER — SODIUM CHLORIDE 0.9% FLUSH: 3.0000 mL | INTRAVENOUS | Status: DC | PRN

## 2023-07-17 MED ORDER — DIVALPROEX SODIUM 125 MG PO CSDR
125.0000 mg | DELAYED_RELEASE_CAPSULE | Freq: Two times a day (BID) | ORAL | Status: DC
  Administered 2023-07-18 – 2023-07-22 (×9): 125 mg via ORAL
  Filled 2023-07-17 (×13): qty 1

## 2023-07-17 MED ORDER — ONDANSETRON 4 MG PO TBDP
4.0000 mg | ORAL_TABLET | Freq: Once | ORAL | Status: AC
  Administered 2023-07-17: 4 mg via ORAL
  Filled 2023-07-17: qty 1

## 2023-07-17 MED ORDER — INSULIN ASPART 100 UNIT/ML IJ SOLN
0.0000 [IU] | Freq: Three times a day (TID) | INTRAMUSCULAR | Status: DC
  Administered 2023-07-18: 1 [IU] via SUBCUTANEOUS
  Administered 2023-07-18 – 2023-07-19 (×3): 2 [IU] via SUBCUTANEOUS
  Administered 2023-07-20: 1 [IU] via SUBCUTANEOUS
  Administered 2023-07-20: 2 [IU] via SUBCUTANEOUS
  Administered 2023-07-21 – 2023-07-22 (×2): 1 [IU] via SUBCUTANEOUS
  Administered 2023-07-24: 2 [IU] via SUBCUTANEOUS

## 2023-07-17 MED ORDER — ATORVASTATIN CALCIUM 40 MG PO TABS
40.0000 mg | ORAL_TABLET | Freq: Every day | ORAL | Status: DC
  Administered 2023-07-18 – 2023-07-21 (×4): 40 mg via ORAL
  Filled 2023-07-17 (×6): qty 1

## 2023-07-17 MED ORDER — OXYMETAZOLINE HCL 0.05 % NA SOLN: 2.0000 | Freq: Two times a day (BID) | NASAL | Status: AC | PRN

## 2023-07-17 MED ORDER — ASPIRIN 81 MG PO CHEW
81.0000 mg | CHEWABLE_TABLET | Freq: Every day | ORAL | Status: DC
  Administered 2023-07-18 – 2023-07-22 (×5): 81 mg via ORAL
  Filled 2023-07-17 (×7): qty 1

## 2023-07-17 MED ORDER — PANCRELIPASE (LIP-PROT-AMYL) 12000-38000 UNITS PO CPEP
24000.0000 [IU] | ORAL_CAPSULE | Freq: Three times a day (TID) | ORAL | Status: DC
  Administered 2023-07-18 – 2023-07-22 (×14): 24000 [IU] via ORAL
  Filled 2023-07-17 (×18): qty 2

## 2023-07-17 MED ORDER — HEPARIN SODIUM (PORCINE) 5000 UNIT/ML IJ SOLN
5000.0000 [IU] | Freq: Three times a day (TID) | INTRAMUSCULAR | Status: DC
  Administered 2023-07-18 – 2023-07-24 (×19): 5000 [IU] via SUBCUTANEOUS
  Filled 2023-07-17 (×19): qty 1

## 2023-07-17 MED ORDER — METRONIDAZOLE 500 MG/100ML IV SOLN
500.0000 mg | Freq: Once | INTRAVENOUS | Status: AC
  Administered 2023-07-17: 500 mg via INTRAVENOUS
  Filled 2023-07-17: qty 100

## 2023-07-17 MED ORDER — CLOPIDOGREL BISULFATE 75 MG PO TABS
75.0000 mg | ORAL_TABLET | Freq: Every day | ORAL | Status: DC
Start: 2023-07-18 — End: 2023-07-18

## 2023-07-17 MED ORDER — VANCOMYCIN VARIABLE DOSE PER UNSTABLE RENAL FUNCTION (PHARMACIST DOSING): Status: DC

## 2023-07-17 MED ORDER — LACTATED RINGERS IV BOLUS (SEPSIS)
1000.0000 mL | Freq: Once | INTRAVENOUS | Status: AC
  Administered 2023-07-17: 1000 mL via INTRAVENOUS

## 2023-07-17 NOTE — H&P (Incomplete)
 History and Physical    Justin [name] ZOX:[id number] DOB: Sep 17, 1934 DOA: 07/17/2023  PCP: Sharlet Salina, MD   Patient coming from: Home   Chief Complaint: Vomiting ED TRIAGE note:  Pt bib ems from [facility] with vomiting. Given zofran at 0900 and has not had any other episodes of emesis since. On doxycyline since the 5th for infection to L foot.       HPI:  Justin [name] is a 89 y.o. male with medical history significant of left third digit amputation on 06/19/23, CKD stage IIIb, chronic pancreatitis, essential hypertension, insulin-dependent DM type II, hypercholesteremia, chronic iron deficiency anemia, chronic adenocarcinoma 2015, history of CVA with residual right-sided hemiparesis, TIA, mixed vascular and Alzheimer's dementia and left TMA presented to emergency department complaining of vomiting and nausea.  Patient reported started vomiting this morning.  He has been currently treating with doxycycline for left foot infection.   ED Course:  At presentation to ED patient found to be tachycardic and borderline hypotensive otherwise hemodynamically stable. Blood cultures are in process. Elevated lactic acid 2.1. Elevated time INR to 19 and 1.6.  Pending UA. Pending respiratory panel. CMP showing low bicarb 15, elevated creatinine 2.57, low albumin 2.3, elevated anion gap 17.  Blood glucose 244. CBC showing leukocytosis 20.7, stable H&H/limited platelet count for weight.   Chest x-ray showing bandlike atelectasis of the left lung base.  Pending MRI of the left foot.  CT abdomen pelvis showing acute cystitis.  Moderate amount of fecal material throughout the colon, as can be seen in constipation. A significant column of formed stool noted in the rectosigmoid colon, measuring 8.5 x 8.4 cm in the rectum with mild wall thickening of the rectosigmoid junction, as can be seen in early stercoral colitis. 3. Along the gastric antrum (axial 19), there is a 1.5 cm  linear radiopaque structure, which may reflect an endoscopic clip or ingested foreign body. Clinical correlation requested.  It has been almost 6 hours the MRI result has not became back yet.  Possibly patient might have some underlying osteomyelitis of the left foot second toe.  Physical exam showing gangrene of the left foot second digit.  With the concern for sepsis in the ED patient being given 1 L of LR bolus, currently on LR 150 cc/h,.  Has been treating with vancomycin, metronidazole and cefepime.  Hospitalist has been consulted for further evaluation management of sepsis (possibly from underlying osteomyelitis of the left foot), AKI and diabetic foot gangrene.   Significant labs in the ED: Lab Orders         Resp panel by RT-PCR (RSV, Flu A&B, Covid) Anterior Nasal Swab         Blood Culture (routine x 2)         Urine Culture         CBC with Differential         Comprehensive metabolic panel         Protime-INR         Urinalysis, w/ Reflex to Culture (Infection Suspected) -Urine, Clean Catch         Lactic acid, plasma         Comprehensive metabolic panel         CBC         Creatinine, urine, random         Sodium, urine, random         I-Stat Lactic Acid, ED       Review  of Systems:  Review of Systems  Constitutional:  Negative for chills and fever.  Respiratory:  Negative for cough and shortness of breath.   Cardiovascular:  Negative for chest pain and palpitations.  Gastrointestinal:  Negative for abdominal pain, constipation, diarrhea, heartburn, nausea and vomiting.  Genitourinary:  Negative for dysuria, frequency and urgency.  Musculoskeletal:  Negative for back pain, falls, myalgias and neck pain.       Left-sided foot pain  Neurological:  Negative for dizziness and headaches.  Psychiatric/Behavioral:  Positive for memory loss. The patient is not nervous/anxious.     Past Medical History:  Diagnosis Date   Acute metabolic encephalopathy 05/24/2021    Arthritis    Diabetes mellitus    HTN (hypertension)    Hypercholesteremia    Iron deficiency    Pancreatitis, acute 06/02/2012   Diagnosed 05/2012    Personal history of colonic adenomas 04/15/2013   04/15/2013 2 diminutive polyps     Stroke [facility])    TIA (transient ischemic attack)     Past Surgical History:  Procedure Laterality Date   ABDOMINAL AORTOGRAM W/LOWER EXTREMITY Right 11/24/2021   Procedure: ABDOMINAL AORTOGRAM W/LOWER EXTREMITY;  Surgeon: Leonie Douglas, MD;  Location: [facility];  Service: Cardiovascular;  Laterality: Right;   ABDOMINAL AORTOGRAM W/LOWER EXTREMITY Left 04/19/2023   Procedure: ABDOMINAL AORTOGRAM W/LOWER EXTREMITY;  Surgeon: Leonie Douglas, MD;  Location: [facility];  Service: Cardiovascular;  Laterality: Left;   AMPUTATION TOE Right 11/26/2021   Procedure: AMPUTATION TOE;  Surgeon: Vivi Barrack, DPM;  Location: [facility] OR;  Service: Podiatry;  Laterality: Right;   ARTERY BIOPSY  02/26/2011   Procedure: BIOPSY TEMPORAL ARTERY;  Surgeon: Iona Coach, MD;  Location: [facility];  Service: General;  Laterality: Left;  Left temporal artery biospy   BACK SURGERY  1985   CHOLECYSTECTOMY     COLONOSCOPY     ESOPHAGOGASTRODUODENOSCOPY N/A 12/06/2022   Procedure: ESOPHAGOGASTRODUODENOSCOPY (EGD);  Surgeon: Shellia Cleverly, DO;  Location: [facility] ENDOSCOPY;  Service: Gastroenterology;  Laterality: N/A;   EUS     HEMOSTASIS CLIP PLACEMENT  12/06/2022   Procedure: HEMOSTASIS CLIP PLACEMENT;  Surgeon: Shellia Cleverly, DO;  Location: [facility] ENDOS[facility];  Service: Gastroenterology;;   HOT HEMOSTASIS N/A 12/06/2022   Procedure: HOT HEMOSTASIS (ARGON PLASMA COAGULATION/BICAP);  Surgeon: Shellia Cleverly, DO;  Location: [facility] ENDOSCOPY;  Service: Gastroenterology;  Laterality: N/A;   NECK SURGERY  1985   PERIPHERAL VASCULAR BALLOON ANGIOPLASTY Right 11/24/2021   Procedure: PERIPHERAL VASCULAR BALLOON ANGIOPLASTY;  Surgeon: Leonie Douglas, MD;   Location: [facility];  Service: Cardiovascular;  Laterality: Right;  POPLITEAL AND PERONEAL   PERIPHERAL VASCULAR BALLOON ANGIOPLASTY Left 04/19/2023   Procedure: PERIPHERAL VASCULAR BALLOON ANGIOPLASTY;  Surgeon: Leonie Douglas, MD;  Location: [facility];  Service: Cardiovascular;  Laterality: Left;   TEMPORAL ARTERY BIOPSY / LIGATION  02/26/2011   left side   TRANSMETATARSAL AMPUTATION Right 01/28/2022   Procedure: TRANSMETATARSAL AMPUTATION; ACHILLES TENDON LENGTHENING;  Surgeon: Candelaria Stagers, DPM;  Location: [facility] ORS;  Service: Podiatry;  Laterality: Right;  R foot Transmetatarsal amputation, possible tendo achilles lengthening   UPPER GASTROINTESTINAL ENDOSCOPY       reports that he quit smoking about 33 years ago. His smoking use included cigarettes. He started smoking about 68 years ago. He has never used smokeless tobacco. He reports that he does not drink alcohol and does not use drugs.  No Known Allergies  Family History  Problem Relation Age of Onset   Heart disease Mother    Amblyopia Father    Diabetes Sister        x 2   Heart attack Sister     Prior to Admission medications   Medication Sig Start Date End Date Taking? Authorizing Provider  acetaminophen (TYLENOL) 325 MG tablet Take 650 mg by mouth daily.    [provider]  ascorbic acid (VITAMIN C) 500 MG tablet Take 1 tablet (500 mg total) by mouth daily. 04/24/23   Regalado, Jon Billings A, MD  aspirin 81 MG chewable tablet Chew 81 mg by mouth daily.    [provider]  atorvastatin (LIPITOR) 40 MG tablet Take 40 mg by mouth at bedtime. 11/03/21   [provider]  BREO ELLIPTA 100-25 MCG/ACT AEPB Inhale 1 puff into the lungs daily. 09/14/22   [provider]  clopidogrel (PLAVIX) 75 MG tablet Take 1 tablet (75 mg total) by mouth daily with breakfast. 04/24/23   Regalado, Belkys A, MD  divalproex (DEPAKOTE SPRINKLE) 125 MG capsule Take 125 mg by mouth 2 (two) times daily.  10/27/22   [provider]  docusate sodium (COLACE) 100 MG capsule Take 100 mg by mouth 2 (two) times daily.    [provider]  ferrous gluconate (FERGON) 324 MG tablet Take 324 mg by mouth daily.    [provider]  gabapentin (NEURONTIN) 100 MG capsule Take 100 mg by mouth 2 (two) times daily. 10/29/22   [provider]  glucagon (GLUCAGON EMERGENCY) 1 MG injection Inject 1 mg into the muscle once as needed. Patient taking differently: Inject 1 mg into the muscle once as needed (for low blood sugar). 09/29/15   Carlus Pavlov, MD  hydrOXYzine (ATARAX) 10 MG tablet Take 1 tablet (10 mg total) by mouth 2 (two) times daily as needed for anxiety or itching. 04/23/23   Regalado, Belkys A, MD  insulin lispro (HUMALOG) 100 UNIT/ML injection Inject 0-12 Units into the skin 3 (three) times daily before meals. BS 70-200 give 0 units  BS 201-250 give 2 units  BS 251-300 give 4 units  BS 301-350 give 6 units  BS 351-400 give 8 units  BS 401-450 give 10 units  BS 451-600 give 12 units    [provider]  Ipratropium-Albuterol (COMBIVENT) 20-100 MCG/ACT AERS respimat Inhale 1 puff into the lungs every 6 (six) hours as needed for wheezing. Patient taking differently: Inhale 1 puff into the lungs every 6 (six) hours as needed (for COPD). 05/25/21   Tyrone Nine, MD  isosorbide mononitrate (ISMO) 10 MG tablet Take 10 mg by mouth at bedtime.    [provider]  loratadine (CLARITIN) 10 MG tablet Take 10 mg by mouth daily.    [provider]  metoprolol tartrate (LOPRESSOR) 25 MG tablet Take 0.5 tablets (12.5 mg total) by mouth 2 (two) times daily. 04/23/23   Regalado, Belkys A, MD  Multiple Vitamin (MULTIVITAMIN WITH MINERALS) TABS tablet Take 1 tablet by mouth daily. 04/24/23   Regalado, Belkys A, MD  oxyCODONE (OXY IR/ROXICODONE) 5 MG immediate release tablet Take 5 mg by mouth every 8 (eight) hours as needed for severe pain (pain score 7-10).     [provider]  oxymetazoline (AFRIN) 0.05 % nasal spray Place 2 sprays into both nostrils 2 (two) times daily as needed for congestion.    [provider]  Pancrelipase, Lip-Prot-Amyl, (CREON) 24000-76000 units CPEP Take 1 capsule by mouth  3 (three) times daily. With 4-6 ounces of water    [provider]  pantoprazole (PROTONIX) 40 MG tablet Take 1 tablet (40 mg total) by mouth daily. 12/08/22 12/08/23  Leatha Gilding, MD  Pollen Extracts (PROSTAT PO) Take 30 mLs by mouth 3 (three) times daily. 17-100g-kcal/30 ml    [provider]  sertraline (ZOLOFT) 25 MG tablet Take 25 mg by mouth daily.    [provider]  Skin Protectants, Misc. (MINERIN CREME EX) Apply 1 Application topically at bedtime. Both legs and feet.    [provider]  Sodium Chloride (NASAL MIST) 0.9 % AERS Place 2 sprays into the nose in the morning and at bedtime.    [provider]  Spacer/Aero-Holding Rudean Curt Use as directed with combivent. Ok to dispense any spacer that works with the inhaler being rxed to pt. 09/20/15   Sherren Mocha, MD  zinc sulfate, 50mg  elemental zinc, 220 (50 Zn) MG capsule Take 1 capsule (220 mg total) by mouth daily. 04/24/23   Alba Cory, MD     Physical Exam: Vitals:   07/17/23 1700 07/17/23 1730 07/17/23 2030 07/17/23 2154  BP: 133/65 114/65 123/65   Pulse:   (!) 104   Resp: 14 16 17    Temp:   98.3 F (36.8 C) 98 F (36.7 C)  TempSrc:   Oral Oral  SpO2:   100%   Weight:      Height:        Physical Exam Vitals and nursing note reviewed.  HENT:     Mouth/Throat:     Mouth: Mucous membranes are moist.  Eyes:     Conjunctiva/sclera: Conjunctivae normal.  Cardiovascular:     Rate and Rhythm: Normal rate and regular rhythm.     Pulses: Normal pulses.     Heart sounds: Normal heart sounds.  Pulmonary:     Effort: Pulmonary effort is normal.     Breath sounds: Normal breath sounds.  Abdominal:     General:  There is no distension.     Tenderness: There is no abdominal tenderness.  Musculoskeletal:        General: No swelling.     Cervical back: Neck supple.     Right lower leg: No edema.     Left lower leg: No edema.     Comments: Left-sided foot first and second toe dry gangrene with blackish discoloration.  Third digit post amputation.  Skin:    General: Skin is dry.     Capillary Refill: Capillary refill takes less than 2 seconds.  Neurological:     Comments: Alert and oriented to self.  Psychiatric:     Comments: Unable to assess      Labs on Admission: I have personally reviewed following labs and imaging studies  CBC: Recent Labs  Lab 07/17/23 1420  WBC 20.7*  NEUTROABS 17.3*  HGB 12.5*  HCT 40.2  MCV 99.3  PLT 408*   Basic Metabolic Panel: Recent Labs  Lab 07/17/23 1420  NA 135  K 5.0  CL 103  CO2 15*  GLUCOSE 244*  BUN 50*  CREATININE 2.57*  CALCIUM 8.6*   GFR: Estimated Creatinine Clearance: 19.8 mL/min (A) (by C-G formula based on SCr of 2.57 mg/dL (H)). Liver Function Tests: Recent Labs  Lab 07/17/23 1420  AST 31  ALT 29  ALKPHOS 80  BILITOT 0.8  PROT 8.9*  ALBUMIN 2.3*   No results for input(s): "LIPASE", "AMYLASE" in the last  168 hours. No results for input(s): "AMMONIA" in the last 168 hours. Coagulation Profile: Recent Labs  Lab 07/17/23 2020  INR 1.6*   Cardiac Enzymes: No results for input(s): "CKTOTAL", "CKMB", "CKMBINDEX", "TROPONINI", "TROPONINIHS" in the last 168 hours. BNP (last 3 results) No results for input(s): "BNP" in the last 8760 hours. HbA1C: No results for input(s): "HGBA1C" in the last 72 hours. CBG: No results for input(s): "GLUCAP" in the last 168 hours. Lipid Profile: No results for input(s): "CHOL", "HDL", "LDLCALC", "TRIG", "CHOLHDL", "LDLDIRECT" in the last 72 hours. Thyroid Function Tests: No results for input(s): "TSH", "T4TOTAL", "FREET4", "T3FREE", "THYROIDAB" in the last 72 hours. Anemia Panel: No  results for input(s): "VITAMINB12", "FOLATE", "FERRITIN", "TIBC", "IRON", "RETICCTPCT" in the last 72 hours. Urine analysis:    Component Value Date/Time   COLORURINE AMBER (A) 07/17/2023 2350   APPEARANCEUR TURBID (A) 07/17/2023 2350   LABSPEC 1.014 07/17/2023 2350   PHURINE 5.0 07/17/2023 2350   GLUCOSEU 50 (A) 07/17/2023 2350   HGBUR LARGE (A) 07/17/2023 2350   BILIRUBINUR NEGATIVE 07/17/2023 2350   BILIRUBINUR negative 09/27/2015 1629   BILIRUBINUR neg 07/01/2012 0902   KETONESUR 5 (A) 07/17/2023 2350   PROTEINUR 100 (A) 07/17/2023 2350   UROBILINOGEN 4.0 09/27/2015 1629   NITRITE NEGATIVE 07/17/2023 2350   LEUKOCYTESUR LARGE (A) 07/17/2023 2350    Radiological Exams on Admission: I have personally reviewed images CT ABDOMEN PELVIS WO CONTRAST Result Date: 07/17/2023 CLINICAL DATA:  Abdominal pain, acute, nonlocalized, nausea and vomiting EXAM: CT ABDOMEN AND PELVIS WITHOUT CONTRAST TECHNIQUE: Multidetector CT imaging of the abdomen and pelvis was performed following the standard protocol without IV contrast. Of note, the lack of intravenous contrast limits evaluation of the solid organ parenchyma and vascularity. RADIATION DOSE REDUCTION: This exam was performed according to the departmental dose-optimization program which includes automated exposure control, adjustment of the mA and/or kV according to patient size and/or use of iterative reconstruction technique. COMPARISON:  December 05, 2022, January 03, 2013 FINDINGS: Lower chest: No focal airspace consolidation or pleural effusion.Fibro linear scarring and subsegmental atelectasis in the lung bases. Dense multi-vessel coronary atherosclerosis. Hepatobiliary: Similar clustered chunky parenchymal calcification centered primarily within segment 7 of the liver, which given the stability, this is benign.Cholecystectomy.No intrahepatic or extrahepatic biliary ductal dilation. Pancreas: atrophy of the pancreatic parenchyma. No mass or ductal  dilation.No peripancreatic inflammation or fluid collection. Spleen: Normal size. No mass. Adrenals/Urinary Tract: Unchanged nodularity of the left adrenal gland without discrete mass or dominant nodule. No renal mass. Extensive renal hilar vascular calcification. No hydronephrosis or nephrolithiasis. Decompressed urinary bladder with perivesicular stranding. Stomach/Bowel: Moderate amount of ingested material in the stomach. 1.5 cm radiopaque structure along the gastric antrum (axial 19). No small bowel wall thickening or inflammation. No small bowel obstruction. Normal appendix. Moderate amount of fecal material throughout the proximal colon. There is a moderate column of formed stool in the rectosigmoid colon measuring approximately 8.5 by 8.4 cm in the rectum. Mild wall thickening at the rectosigmoid colon junction. Vascular/Lymphatic: No aortic aneurysm. Diffuse aortoiliac atherosclerosis. No intraabdominal or pelvic lymphadenopathy. Reproductive: No prostatomegaly.No free pelvic fluid. Other: No pneumoperitoneum, ascites, or mesenteric inflammation. Musculoskeletal: No acute fracture or destructive lesion.Diffuse osteopenia. Chronic compression fractures of L1 and L3, unchanged. Multilevel degenerative disc disease of the spine. Moderate to severe bilateral hip osteoarthritis. IMPRESSION: 1. Findings worrisome for acute cystitis. Correlation with urinalysis recommended. 2. Moderate amount of fecal material throughout the colon, as can be seen in constipation. A significant column of  formed stool noted in the rectosigmoid colon, measuring 8.5 x 8.4 cm in the rectum with mild wall thickening of the rectosigmoid junction, as can be seen in early stercoral colitis. 3. Along the gastric antrum (axial 19), there is a 1.5 cm linear radiopaque structure, which may reflect an endoscopic clip or ingested foreign body. Clinical correlation requested. Electronically Signed   By: Wallie Char M.D.   On: 07/17/2023 21:40    DG Chest Port 1 View Result Date: 07/17/2023 CLINICAL DATA:  Questionable sepsis - evaluate for abnormality. Nausea and vomiting EXAM: PORTABLE CHEST 1 VIEW COMPARISON:  Radiograph 01/26/2022. FINDINGS: The heart is normal in size. Bandlike atelectasis at the left lung base. No confluent airspace disease. No pulmonary edema, large pleural effusion or pneumothorax. IMPRESSION: Bandlike atelectasis at the left lung base. Electronically Signed   By: Narda Rutherford M.D.   On: 07/17/2023 18:55     EKG: My personal interpretation of EKG shows:     Assessment/Plan: Principal Problem:   Sepsis ([name]) Active Problems:   Acute kidney injury superimposed on chronic kidney disease ([name])   Dry gangrene ([name])   Non-insulin dependent type 2 diabetes mellitus ([name])   Chronic pancreatitis ([name])   Essential hypertension   Anemia of chronic disease   History of CVA (cerebrovascular accident)   Iron deficiency anemia   Dementia without behavioral disturbance ([name])   Reactive airway disease   History of dementia    Assessment and Plan: Sepsis Left-sided foot second digit dry gangrene-concern for underlying osteomyelitis History of right metatarsal amputation -Patient presented emergency department complaining of nausea and vomiting and poor oral intake.  He is also complaining about left foot pain.  Underlying dementia.  Poor historian. -Physical exam showing left-sided third digit amputation and second digit blackish discoloration.  Presentation to ED patient is tachycardic and hypotensive.  CBC showing leukocytosis 20.7 otherwise unremarkable.  Elevated lactic acid 2.1. -Pending MRI of the left foot. - Concern for sepsis in the setting of left-sided diabetic foot infection versus underlying osteomyelitis. - Continue sepsis protocol.  In the ED patient has been given 1 L of LR bolus.  Giving another liter of LR bolus and continue LR 150 cc/h. - Continue broad-spectrum antibiotic coverage with IV  vancomycin and cefepime. - Blood cultures are pending. - Need to follow-up with the MRI result. -Consulted podiatry Dr. Annamary Rummage request for evaluation.   Acute kidney injury superimposed CKD stage IIIb -CMP showing elevated creatinine 2.57 and low GFR.  Baseline creatinine around 1.3-1.4 and GFR been between 36-48. - Pending UA. - Prerenal acute kidney injury in the setting of nausea, vomiting and sepsis. - Continue IV fluid resuscitation.  Monitor renal function.  Avoid intoxication.  High anion metabolic acidosis - Low bicarb 15 elevated anion gap 17.  High anion gap metabolic acidosis in the setting of AKI. -Anion gap has been improved 17-14 and bicarb level is 16. - Starting oral bicarb 650 mg twice daily.    Chronic pancreatitis Nausea and vomiting - CT abdomen pelvis showing acute cystitis.  Moderate amount of stool burden, constipation andstercoral colitis.  Along the gastric antrum (axial 19), there is a 1.5 cm linear radiopaque structure, which may reflect an endoscopic clip or ingested foreign body. Clinical correlation requested. -Patient currently denying any nausea, vomiting and abdominal pain. -Continue pancrelipase and nausea control.   Non-insulin-dependent DM type type II - Continue sliding scale SSI with mealtime coverage.  Essential hypertension -Holding Imdur and Lopressor in the setting of sepsis  and hypotension.  History of CVA -Continue aspirin and Lipitor.  Iron deficiency anemia Stable H&H.  Continue oral iron supplement.  History of dementia without behavioral disturbance. - Continue Zoloft 25 mg daily, Atarax as needed and Depakote twice daily.  Chronic constipation - Continue MiraLAX twice daily and Senokot twice daily.  Reactive airway disease -Stable.  Continue Breo Ellipta and Combivent as needed   DVT prophylaxis:  SQ Heparin Code Status:  DNR/DNI(Do NOT Intubate) Diet: Heart healthy carb modified diet Family Communication:  Currently no family member at bedside Disposition Plan: Pending MRI of the foot result. Consults: Podiatry and wound care Admission status:   Inpatient, Telemetry bed  Severity of Illness: The appropriate patient status for this patient is INPATIENT. Inpatient status is judged to be reasonable and necessary in order to provide the required intensity of service to ensure the patient's safety. The patient's presenting symptoms, physical exam findings, and initial radiographic and laboratory data in the context of their chronic comorbidities is felt to place them at high risk for further clinical deterioration. Furthermore, it is not anticipated that the patient will be medically stable for discharge from the hospital within 2 midnights of admission.   * I certify that at the point of admission it is my clinical judgment that the patient will require inpatient hospital care spanning beyond 2 midnights from the point of admission due to high intensity of service, high risk for further deterioration and high frequency of surveillance required.Marland Kitchen    Tereasa Coop, MD [facility]  How to contact the [facility] Attending or Consulting provider 7A - 7P or covering provider during after hours 7P -7A, for this patient.  Check the care team in [facility] and look for a) attending/consulting [facility] provider listed and b) the [facility] team listed Log into [email or web address] and use 's universal password to access. If you do not have the password, please contact the hospital operator. Locate the [facility] provider you are looking for under [facility] and page to a number that you can be directly reached. If you still have difficulty reaching the provider, please page the [facility] (Director on Call) for the Hospitalists listed on amion for assistance.  07/18/2023, 12:09 AM

## 2023-07-17 NOTE — ED Notes (Signed)
 EKG handed to [facility]

## 2023-07-17 NOTE — ED Triage Notes (Signed)
 Pt bib ems from [facility] with vomiting. Given zofran at 0900 and has not had any other episodes of emesis since. On doxycyline since the 5th for infection to L foot.

## 2023-07-17 NOTE — Sepsis Progress Note (Signed)
 eLink is following this Code Sepsis.

## 2023-07-17 NOTE — ED Notes (Signed)
 Patient soiled chuck pad, removed and patient cleaned up and replaced soiled pads with clean pad.

## 2023-07-17 NOTE — Progress Notes (Signed)
 Pharmacy Antibiotic Note  Justin [name] is a 89 y.o. male admitted on 07/17/2023 with sepsis.  Prior to presentation, pt taking doxycycline since 4/4 for L foot wound infection. MRI of foot pending. Noted to be in AKI. Pharmacy has been consulted for vancomycin and cefepime dosing.  Plan: Cefepime 2g q24h Vancomycin dose per levels with unstable renal function  F/u renal function, infectious work up and length of therapy Vancomycin levels as needed  Height: 6\' 2"  (188 cm) Weight: 68 kg (150 lb) IBW/kg (Calculated) : 82.2  Temp (24hrs), Avg:98.1 F (36.7 C), Min:98 F (36.7 C), Max:98.3 F (36.8 C)  Recent Labs  Lab 07/17/23 1420 07/17/23 2027  WBC 20.7*  --   CREATININE 2.57*  --   LATICACIDVEN  --  2.1*    Estimated Creatinine Clearance: 19.8 mL/min (A) (by C-G formula based on SCr of 2.57 mg/dL (H)).    No Known Allergies  Antimicrobials this admission: Cefepime 4/9 > Vancomycin 4/9 > Flagyl 4/9 >  Microbiology results: 4/9 bcx: 4/9 resp panel:   Thank you for allowing pharmacy to be a part of this patient's care.  Marja Kays 07/17/2023 11:41 PM

## 2023-07-17 NOTE — ED Notes (Signed)
 Patient transported to MRI

## 2023-07-17 NOTE — ED Provider Notes (Signed)
 [facility] AT [facility] Provider Note   CSN: [id number] Arrival date & time: 07/17/23  1334     History {Add pertinent medical, surgical, social history, OB history to HPI:1} No chief complaint on file.   Justin [name] is a 89 y.o. male.  89 year old male with past medical history of advanced dementia diabetes mellitus hypertension hyperlipidemia presenting to the emergency department today with nausea and vomiting.  This apparently started this morning.  The patient is currently being treated for a wound infection on his left foot.  The patient has been on doxycycline now for the past few days.  He has been afebrile as far as they know but he does have advanced dementia so the patient's family is unsure.  The patient is denying any complaints currently.        Home Medications Prior to Admission medications   Medication Sig Start Date End Date Taking? Authorizing Provider  acetaminophen (TYLENOL) 325 MG tablet Take 650 mg by mouth daily.    [provider]  ascorbic acid (VITAMIN C) 500 MG tablet Take 1 tablet (500 mg total) by mouth daily. 04/24/23   Regalado, Jon Billings A, MD  aspirin 81 MG chewable tablet Chew 81 mg by mouth daily.    [provider]  atorvastatin (LIPITOR) 40 MG tablet Take 40 mg by mouth at bedtime. 11/03/21   [provider]  BREO ELLIPTA 100-25 MCG/ACT AEPB Inhale 1 puff into the lungs daily. 09/14/22   [provider]  clopidogrel (PLAVIX) 75 MG tablet Take 1 tablet (75 mg total) by mouth daily with breakfast. 04/24/23   Regalado, Belkys A, MD  divalproex (DEPAKOTE SPRINKLE) 125 MG capsule Take 125 mg by mouth 2 (two) times daily. 10/27/22   [provider]  docusate sodium (COLACE) 100 MG capsule Take 100 mg by mouth 2 (two) times daily.    [provider]  ferrous gluconate (FERGON) 324 MG tablet Take 324 mg by mouth daily.    [provider]  gabapentin (NEURONTIN)  100 MG capsule Take 100 mg by mouth 2 (two) times daily. 10/29/22   [provider]  glucagon (GLUCAGON EMERGENCY) 1 MG injection Inject 1 mg into the muscle once as needed. Patient taking differently: Inject 1 mg into the muscle once as needed (for low blood sugar). 09/29/15   Carlus Pavlov, MD  hydrOXYzine (ATARAX) 10 MG tablet Take 1 tablet (10 mg total) by mouth 2 (two) times daily as needed for anxiety or itching. 04/23/23   Regalado, Belkys A, MD  insulin lispro (HUMALOG) 100 UNIT/ML injection Inject 0-12 Units into the skin 3 (three) times daily before meals. BS 70-200 give 0 units  BS 201-250 give 2 units  BS 251-300 give 4 units  BS 301-350 give 6 units  BS 351-400 give 8 units  BS 401-450 give 10 units  BS 451-600 give 12 units    [provider]  Ipratropium-Albuterol (COMBIVENT) 20-100 MCG/ACT AERS respimat Inhale 1 puff into the lungs every 6 (six) hours as needed for wheezing. Patient taking differently: Inhale 1 puff into the lungs every 6 (six) hours as needed (for COPD). 05/25/21   Tyrone Nine, MD  isosorbide mononitrate (ISMO) 10 MG tablet Take 10 mg by mouth at bedtime.    [provider]  loratadine (CLARITIN) 10 MG tablet Take 10 mg by mouth daily.    [provider]  metoprolol tartrate (LOPRESSOR) 25 MG tablet Take 0.5 tablets (12.5 mg  total) by mouth 2 (two) times daily. 04/23/23   Regalado, Belkys A, MD  Multiple Vitamin (MULTIVITAMIN WITH MINERALS) TABS tablet Take 1 tablet by mouth daily. 04/24/23   Regalado, Belkys A, MD  oxyCODONE (OXY IR/ROXICODONE) 5 MG immediate release tablet Take 5 mg by mouth every 8 (eight) hours as needed for severe pain (pain score 7-10).    [provider]  oxymetazoline (AFRIN) 0.05 % nasal spray Place 2 sprays into both nostrils 2 (two) times daily as needed for congestion.    [provider]  Pancrelipase, Lip-Prot-Amyl, (CREON) 24000-76000 units CPEP Take 1 capsule by mouth 3 (three)  times daily. With 4-6 ounces of water    [provider]  pantoprazole (PROTONIX) 40 MG tablet Take 1 tablet (40 mg total) by mouth daily. 12/08/22 12/08/23  Leatha Gilding, MD  Pollen Extracts (PROSTAT PO) Take 30 mLs by mouth 3 (three) times daily. 17-100g-kcal/30 ml    [provider]  sertraline (ZOLOFT) 25 MG tablet Take 25 mg by mouth daily.    [provider]  Skin Protectants, Misc. (MINERIN CREME EX) Apply 1 Application topically at bedtime. Both legs and feet.    [provider]  Sodium Chloride (NASAL MIST) 0.9 % AERS Place 2 sprays into the nose in the morning and at bedtime.    [provider]  Spacer/Aero-Holding Rudean Curt Use as directed with combivent. Ok to dispense any spacer that works with the inhaler being rxed to pt. 09/20/15   Sherren Mocha, MD  zinc sulfate, 50mg  elemental zinc, 220 (50 Zn) MG capsule Take 1 capsule (220 mg total) by mouth daily. 04/24/23   Regalado, Prentiss Bells, MD      Allergies    Patient has no known allergies.    Review of Systems   Review of Systems  Reason unable to perform ROS: Advanced dementia.    Physical Exam Updated Vital Signs BP 123/64   Pulse (!) 105   Temp 98 F (36.7 C)   Resp 15   Ht 6\' 2"  (1.88 m)   Wt 68 kg   SpO2 100%   BMI 19.26 kg/m  Physical Exam Vitals and nursing note reviewed.   Gen: NAD, chronically ill appearing Eyes: PERRL, EOMI HEENT: no oropharyngeal swelling, dry mucous membranes Neck: trachea midline Resp: clear to auscultation bilaterally Card: Tachycardic, no murmurs, rubs, or gallops Abd: nontender, nondistended Extremities: no calf tenderness, no edema, the patient does have his third toe on his left foot amputated with chronic appearing wound, the patient's 1st and 2nd toes on the left foot do have some eschar noted with no obvious drainage Vascular: 2+ radial pulses bilaterally, 2+ DP pulses bilaterally Skin: no rashes Psyc: acting  appropriately   ED Results / Procedures / Treatments   Labs (all labs ordered are listed, but only abnormal results are displayed) Labs Reviewed  CBC WITH DIFFERENTIAL/PLATELET - Abnormal; Notable for the following components:      Result Value   WBC 20.7 (*)    RBC 4.05 (*)    Hemoglobin 12.5 (*)    Platelets 408 (*)    Neutro Abs 17.3 (*)    Basophils Absolute 0.2 (*)    Abs Immature Granulocytes 0.72 (*)    All other components within normal limits  COMPREHENSIVE METABOLIC PANEL WITH GFR - Abnormal; Notable for the following components:   CO2 15 (*)    Glucose, Bld 244 (*)    BUN 50 (*)  Creatinine, Ser 2.57 (*)    Calcium 8.6 (*)    Total Protein 8.9 (*)    Albumin 2.3 (*)    GFR, Estimated 24 (*)    Anion gap 17 (*)    All other components within normal limits  RESP PANEL BY RT-PCR (RSV, FLU A&B, COVID)  RVPGX2  CULTURE, BLOOD (ROUTINE X 2)  CULTURE, BLOOD (ROUTINE X 2)  URINALYSIS, ROUTINE W REFLEX MICROSCOPIC  PROTIME-INR  URINALYSIS, W/ REFLEX TO CULTURE (INFECTION SUSPECTED)  I-STAT CG4 LACTIC ACID, ED    EKG None  Radiology No results found.  Procedures Procedures  {Document cardiac monitor, telemetry assessment procedure when appropriate:1}  Medications Ordered in ED Medications  lactated ringers infusion (has no administration in time range)  lactated ringers bolus 1,000 mL (has no administration in time range)  ceFEPIme (MAXIPIME) 2 g in sodium chloride 0.9 % 100 mL IVPB (has no administration in time range)  metroNIDAZOLE (FLAGYL) IVPB 500 mg (has no administration in time range)  vancomycin (VANCOCIN) IVPB 1000 mg/200 mL premix (has no administration in time range)  ondansetron (ZOFRAN-ODT) disintegrating tablet 4 mg (4 mg Oral Given 07/17/23 1455)    ED Course/ Medical Decision Making/ A&P   {   Click here for ABCD2, HEART and other calculatorsREFRESH Note before signing :1}                              Medical Decision Making 89 year old  male with multiple comorbidities presenting to the emergency department today with nausea and vomiting.  Unfortunately the patient is a very poor historian due to his advanced dementia.  I will initiate a sepsis workup on the patient here.  Will cover him empirically with vancomycin, cefepime, and Flagyl.  I will give the patient a liter of fluid to begin with as his initial blood pressures are within normal limits.  Will obtain a CT scan of his abdomen to evaluate for intra-abdominal processes as well as an MRI of his left foot to evaluate for underlying osteomyelitis.  I will reevaluate for ultimate disposition.  Amount and/or Complexity of Data Reviewed Labs: ordered. Radiology: ordered.  Risk Prescription drug management.   ***  {Document critical care time when appropriate:1} {Document review of labs and clinical decision tools ie heart score, Chads2Vasc2 etc:1}  {Document your independent review of radiology images, and any outside records:1} {Document your discussion with family members, caretakers, and with consultants:1} {Document social determinants of health affecting pt's care:1} {Document your decision making why or why not admission, treatments were needed:1} Final Clinical Impression(s) / ED Diagnoses Final diagnoses:  None    Rx / DC Orders ED Discharge Orders     None

## 2023-07-17 NOTE — ED Notes (Signed)
 Patient unable to hold still in MRI and no IV access at this time to give medication. MRI is going to bring patient back and try again at a different time.

## 2023-07-17 NOTE — ED Notes (Signed)
 This RN is unable to find a vein, IV team order placed and phlebotomy notified for lab draws.

## 2023-07-17 NOTE — H&P (Incomplete)
 History and Physical    Justin [name] AVW:[id number] DOB: [birth year] DOA: 07/17/2023  PCP: Sharlet Salina, MD   Patient coming from: Home   Chief Complaint: Vomiting ED TRIAGE note:  Pt bib ems from [facility] with vomiting. Given zofran at 0900 and has not had any other episodes of emesis since. On doxycyline since the 5th for infection to L foot.       HPI:  Justin [name] is a 89 y.o. male with medical history significant of left third digit amputation on 06/19/23, CKD stage IIIb, chronic pancreatitis, essential hypertension, insulin-dependent DM type II, hypercholesteremia, chronic iron deficiency anemia, chronic adenocarcinoma 2015, history of CVA with residual right-sided hemiparesis, TIA, mixed vascular and Alzheimer's dementia and left TMA presented to emergency department complaining of vomiting and nausea.  Patient reported started vomiting this morning.  He has been currently treating with doxycycline for left foot infection.   ED Course:  At presentation to ED patient found to be tachycardic and borderline hypotensive otherwise hemodynamically stable. Blood cultures are in process. Elevated lactic acid 2.1. Elevated time INR to 19 and 1.6.  Pending UA. Pending respiratory panel. CMP showing low bicarb 15, elevated creatinine 2.57, low albumin 2.3, elevated anion gap 17.  Blood glucose 244. CBC showing leukocytosis 20.7, stable H&H/limited platelet count for weight.   Chest x-ray showing bandlike atelectasis of the left lung base.  Pending MRI of the left foot.  CT abdomen pelvis showing acute cystitis.  Moderate amount of fecal material throughout the colon, as can be seen in constipation. A significant column of formed stool noted in the rectosigmoid colon, measuring 8.5 x 8.4 cm in the rectum with mild wall thickening of the rectosigmoid junction, as can be seen in early stercoral colitis. 3. Along the gastric antrum (axial 19), there is a 1.5 cm  linear radiopaque structure, which may reflect an endoscopic clip or ingested foreign body. Clinical correlation requested.  It has been almost 6 hours the MRI result has not became back yet.  Possibly patient might have some underlying osteomyelitis of the left foot second toe.  Physical exam showing gangrene of the left foot second digit.  With the concern for sepsis in the ED patient being given 1 L of LR bolus, currently on LR 150 cc/h,.  Has been treating with vancomycin, metronidazole and cefepime.  Hospitalist has been consulted for further evaluation management of sepsis (possibly from underlying osteomyelitis of the left foot), AKI and diabetic foot gangrene.   Significant labs in the ED: Lab Orders         Resp panel by RT-PCR (RSV, Flu A&B, Covid) Anterior Nasal Swab         Blood Culture (routine x 2)         CBC with Differential         Comprehensive metabolic panel         Protime-INR         Urinalysis, w/ Reflex to Culture (Infection Suspected) -Urine, Clean Catch         Lactic acid, plasma         Comprehensive metabolic panel         CBC         I-Stat Lactic Acid, ED       Review of Systems:  ROS  Past Medical History:  Diagnosis Date  . Acute metabolic encephalopathy 05/24/2021  . Arthritis   . Diabetes mellitus   . HTN (hypertension)   .  Hypercholesteremia   . Iron deficiency   . Pancreatitis, acute 06/02/2012   Diagnosed 05/2012   . Personal history of colonic adenomas 04/15/2013   04/15/2013 2 diminutive polyps    . Stroke ([name])   . TIA (transient ischemic attack)     Past Surgical History:  Procedure Laterality Date  . ABDOMINAL AORTOGRAM W/LOWER EXTREMITY Right 11/24/2021   Procedure: ABDOMINAL AORTOGRAM W/LOWER EXTREMITY;  Surgeon: Leonie Douglas, MD;  Location: [facility];  Service: Cardiovascular;  Laterality: Right;  . ABDOMINAL AORTOGRAM W/LOWER EXTREMITY Left 04/19/2023   Procedure: ABDOMINAL AORTOGRAM W/LOWER EXTREMITY;  Surgeon:  Leonie Douglas, MD;  Location: [facility];  Service: Cardiovascular;  Laterality: Left;  . AMPUTATION TOE Right 11/26/2021   Procedure: AMPUTATION TOE;  Surgeon: Vivi Barrack, DPM;  Location: [facility];  Service: Podiatry;  Laterality: Right;  . ARTERY BIOPSY  02/26/2011   Procedure: BIOPSY TEMPORAL ARTERY;  Surgeon: Iona Coach, MD;  Location: [facility];  Service: General;  Laterality: Left;  Left temporal artery biospy  . BACK SURGERY  1985  . CHOLECYSTECTOMY    . COLONOSCOPY    . ESOPHAGOGASTRODUODENOSCOPY N/A 12/06/2022   Procedure: ESOPHAGOGASTRODUODENOSCOPY (EGD);  Surgeon: Shellia Cleverly, DO;  Location[facility];  Service: Gastroenterology;  Laterality: N/A;  . EUS    . HEMOSTASIS CLIP PLACEMENT  12/06/2022   Procedure: HEMOSTASIS CLIP PLACEMENT;  Surgeon: Shellia Cleverly, DO;  Location: [facility];  Service: Gastroenterology;;  . HOT HEMOSTASIS N/A 12/06/2022   Procedure: HOT HEMOSTASIS (ARGON PLASMA COAGULATION/BICAP);  Surgeon: Shellia Cleverly, DO;  Location: [facility] ENDOSCOPY;  Service: Gastroenterology;  Laterality: N/A;  . NECK SURGERY  1985  . PERIPHERAL VASCULAR BALLOON ANGIOPLASTY Right 11/24/2021   Procedure: PERIPHERAL VASCULAR BALLOON ANGIOPLASTY;  Surgeon: Leonie Douglas, MD;  Location: [facility];  Service: Cardiovascular;  Laterality: Right;  POPLITEAL AND PERONEAL  . PERIPHERAL VASCULAR BALLOON ANGIOPLASTY Left 04/19/2023   Procedure: PERIPHERAL VASCULAR BALLOON ANGIOPLASTY;  Surgeon: Leonie Douglas, MD;  Location: [facility];  Service: Cardiovascular;  Laterality: Left;  . TEMPORAL ARTERY BIOPSY / LIGATION  02/26/2011   left side  . TRANSMETATARSAL AMPUTATION Right 01/28/2022   Procedure: TRANSMETATARSAL AMPUTATION; ACHILLES TENDON LENGTHENING;  Surgeon: Candelaria Stagers, DPM;  Location: [facility];  Service: Podiatry;  Laterality: Right;  R foot Transmetatarsal amputation, possible tendo achilles lengthening  .  UPPER GASTROINTESTINAL ENDOSCOPY       reports that he quit smoking about 33 years ago. His smoking use included cigarettes. He started smoking about 68 years ago. He has never used smokeless tobacco. He reports that he does not drink alcohol and does not use drugs.  No Known Allergies  Family History  Problem Relation Age of Onset  . Heart disease Mother   . Amblyopia Father   . Diabetes Sister        x 2  . Heart attack Sister     Prior to Admission medications   Medication Sig Start Date End Date Taking? Authorizing Provider  acetaminophen (TYLENOL) 325 MG tablet Take 650 mg by mouth daily.    [provider]  ascorbic acid (VITAMIN C) 500 MG tablet Take 1 tablet (500 mg total) by mouth daily. 04/24/23   Regalado, Jon Billings A, MD  aspirin 81 MG chewable tablet Chew 81 mg by mouth daily.    [provider]  atorvastatin (LIPITOR) 40 MG tablet Take 40 mg by mouth at  bedtime. 11/03/21   [provider]  BREO ELLIPTA 100-25 MCG/ACT AEPB Inhale 1 puff into the lungs daily. 09/14/22   [provider]  clopidogrel (PLAVIX) 75 MG tablet Take 1 tablet (75 mg total) by mouth daily with breakfast. 04/24/23   Regalado, Belkys A, MD  divalproex (DEPAKOTE SPRINKLE) 125 MG capsule Take 125 mg by mouth 2 (two) times daily. 10/27/22   [provider]  docusate sodium (COLACE) 100 MG capsule Take 100 mg by mouth 2 (two) times daily.    [provider]  ferrous gluconate (FERGON) 324 MG tablet Take 324 mg by mouth daily.    [provider]  gabapentin (NEURONTIN) 100 MG capsule Take 100 mg by mouth 2 (two) times daily. 10/29/22   [provider]  glucagon (GLUCAGON EMERGENCY) 1 MG injection Inject 1 mg into the muscle once as needed. Patient taking differently: Inject 1 mg into the muscle once as needed (for low blood sugar). 09/29/15   Carlus Pavlov, MD  hydrOXYzine (ATARAX) 10 MG tablet Take 1 tablet (10 mg total) by mouth 2 (two) times  daily as needed for anxiety or itching. 04/23/23   Regalado, Belkys A, MD  insulin lispro (HUMALOG) 100 UNIT/ML injection Inject 0-12 Units into the skin 3 (three) times daily before meals. BS 70-200 give 0 units  BS 201-250 give 2 units  BS 251-300 give 4 units  BS 301-350 give 6 units  BS 351-400 give 8 units  BS 401-450 give 10 units  BS 451-600 give 12 units    [provider]  Ipratropium-Albuterol (COMBIVENT) 20-100 MCG/ACT AERS respimat Inhale 1 puff into the lungs every 6 (six) hours as needed for wheezing. Patient taking differently: Inhale 1 puff into the lungs every 6 (six) hours as needed (for COPD). 05/25/21   Tyrone Nine, MD  isosorbide mononitrate (ISMO) 10 MG tablet Take 10 mg by mouth at bedtime.    [provider]  loratadine (CLARITIN) 10 MG tablet Take 10 mg by mouth daily.    [provider]  metoprolol tartrate (LOPRESSOR) 25 MG tablet Take 0.5 tablets (12.5 mg total) by mouth 2 (two) times daily. 04/23/23   Regalado, Belkys A, MD  Multiple Vitamin (MULTIVITAMIN WITH MINERALS) TABS tablet Take 1 tablet by mouth daily. 04/24/23   Regalado, Belkys A, MD  oxyCODONE (OXY IR/ROXICODONE) 5 MG immediate release tablet Take 5 mg by mouth every 8 (eight) hours as needed for severe pain (pain score 7-10).    [provider]  oxymetazoline (AFRIN) 0.05 % nasal spray Place 2 sprays into both nostrils 2 (two) times daily as needed for congestion.    [provider]  Pancrelipase, Lip-Prot-Amyl, (CREON) 24000-76000 units CPEP Take 1 capsule by mouth 3 (three) times daily. With 4-6 ounces of water    [provider]  pantoprazole (PROTONIX) 40 MG tablet Take 1 tablet (40 mg total) by mouth daily. 12/08/22 12/08/23  Leatha Gilding, MD  Pollen Extracts (PROSTAT PO) Take 30 mLs by mouth 3 (three) times daily. 17-100g-kcal/30 ml    [provider]  sertraline (ZOLOFT) 25 MG tablet Take 25 mg by mouth daily.    [provider]  Skin Protectants, Misc. (MINERIN CREME EX) Apply 1 Application topically at bedtime. Both legs and feet.    [provider]  Sodium Chloride (NASAL MIST) 0.9 % AERS Place 2 sprays into the nose in the morning and at bedtime.    [provider]  Spacer/Aero-Holding  [facility] Use as directed with combivent. Ok to dispense any spacer that works with the inhaler being rxed to pt. 09/20/15   Sherren Mocha, MD  zinc sulfate, 50mg  elemental zinc, 220 (50 Zn) MG capsule Take 1 capsule (220 mg total) by mouth daily. 04/24/23   Alba Cory, MD     Physical Exam: Vitals:   07/17/23 1700 07/17/23 1730 07/17/23 2030 07/17/23 2154  BP: 133/65 114/65 123/65   Pulse:   (!) 104   Resp: 14 16 17    Temp:   98.3 F (36.8 C) 98 F (36.7 C)  TempSrc:   Oral Oral  SpO2:   100%   Weight:      Height:        Physical Exam   Labs on Admission: I have personally reviewed following labs and imaging studies  CBC: Recent Labs  Lab 07/17/23 1420  WBC 20.7*  NEUTROABS 17.3*  HGB 12.5*  HCT 40.2  MCV 99.3  PLT 408*   Basic Metabolic Panel: Recent Labs  Lab 07/17/23 1420  NA 135  K 5.0  CL 103  CO2 15*  GLUCOSE 244*  BUN 50*  CREATININE 2.57*  CALCIUM 8.6*   GFR: Estimated Creatinine Clearance: 19.8 mL/min (A) (by C-G formula based on SCr of 2.57 mg/dL (H)). Liver Function Tests: Recent Labs  Lab 07/17/23 1420  AST 31  ALT 29  ALKPHOS 80  BILITOT 0.8  PROT 8.9*  ALBUMIN 2.3*   No results for input(s): "LIPASE", "AMYLASE" in the last 168 hours. No results for input(s): "AMMONIA" in the last 168 hours. Coagulation Profile: Recent Labs  Lab 07/17/23 2020  INR 1.6*   Cardiac Enzymes: No results for input(s): "CKTOTAL", "CKMB", "CKMBINDEX", "TROPONINI", "TROPONINIHS" in the last 168 hours. BNP (last 3 results) No results for input(s): "BNP" in the last 8760 hours. HbA1C: No results for input(s): "HGBA1C" in the last 72 hours. CBG: No results for  input(s): "GLUCAP" in the last 168 hours. Lipid Profile: No results for input(s): "CHOL", "HDL", "LDLCALC", "TRIG", "CHOLHDL", "LDLDIRECT" in the last 72 hours. Thyroid Function Tests: No results for input(s): "TSH", "T4TOTAL", "FREET4", "T3FREE", "THYROIDAB" in the last 72 hours. Anemia Panel: No results for input(s): "VITAMINB12", "FOLATE", "FERRITIN", "TIBC", "IRON", "RETICCTPCT" in the last 72 hours. Urine analysis:    Component Value Date/Time   COLORURINE YELLOW 04/18/2023 0233   APPEARANCEUR HAZY (A) 04/18/2023 0233   LABSPEC 1.010 04/18/2023 0233   PHURINE 6.0 04/18/2023 0233   GLUCOSEU 50 (A) 04/18/2023 0233   HGBUR SMALL (A) 04/18/2023 0233   BILIRUBINUR NEGATIVE 04/18/2023 0233   BILIRUBINUR negative 09/27/2015 1629   BILIRUBINUR neg 07/01/2012 0902   KETONESUR NEGATIVE 04/18/2023 0233   PROTEINUR 100 (A) 04/18/2023 0233   UROBILINOGEN 4.0 09/27/2015 1629   NITRITE NEGATIVE 04/18/2023 0233   LEUKOCYTESUR LARGE (A) 04/18/2023 0233    Radiological Exams on Admission: I have personally reviewed images CT ABDOMEN PELVIS WO CONTRAST Result Date: 07/17/2023 CLINICAL DATA:  Abdominal pain, acute, nonlocalized, nausea and vomiting EXAM: CT ABDOMEN AND PELVIS WITHOUT CONTRAST TECHNIQUE: Multidetector CT imaging of the abdomen and pelvis was performed following the standard protocol without IV contrast. Of note, the lack of intravenous contrast limits evaluation of the solid organ parenchyma and vascularity. RADIATION DOSE REDUCTION: This exam was performed according to the departmental dose-optimization program which includes automated exposure control, adjustment of the mA and/or kV according to patient size and/or use of iterative reconstruction technique. COMPARISON:  December 05, 2022,  January 03, 2013 FINDINGS: Lower chest: No focal airspace consolidation or pleural effusion.Fibro linear scarring and subsegmental atelectasis in the lung bases. Dense multi-vessel coronary  atherosclerosis. Hepatobiliary: Similar clustered chunky parenchymal calcification centered primarily within segment 7 of the liver, which given the stability, this is benign.Cholecystectomy.No intrahepatic or extrahepatic biliary ductal dilation. Pancreas: atrophy of the pancreatic parenchyma. No mass or ductal dilation.No peripancreatic inflammation or fluid collection. Spleen: Normal size. No mass. Adrenals/Urinary Tract: Unchanged nodularity of the left adrenal gland without discrete mass or dominant nodule. No renal mass. Extensive renal hilar vascular calcification. No hydronephrosis or nephrolithiasis. Decompressed urinary bladder with perivesicular stranding. Stomach/Bowel: Moderate amount of ingested material in the stomach. 1.5 cm radiopaque structure along the gastric antrum (axial 19). No small bowel wall thickening or inflammation. No small bowel obstruction. Normal appendix. Moderate amount of fecal material throughout the proximal colon. There is a moderate column of formed stool in the rectosigmoid colon measuring approximately 8.5 by 8.4 cm in the rectum. Mild wall thickening at the rectosigmoid colon junction. Vascular/Lymphatic: No aortic aneurysm. Diffuse aortoiliac atherosclerosis. No intraabdominal or pelvic lymphadenopathy. Reproductive: No prostatomegaly.No free pelvic fluid. Other: No pneumoperitoneum, ascites, or mesenteric inflammation. Musculoskeletal: No acute fracture or destructive lesion.Diffuse osteopenia. Chronic compression fractures of L1 and L3, unchanged. Multilevel degenerative disc disease of the spine. Moderate to severe bilateral hip osteoarthritis. IMPRESSION: 1. Findings worrisome for acute cystitis. Correlation with urinalysis recommended. 2. Moderate amount of fecal material throughout the colon, as can be seen in constipation. A significant column of formed stool noted in the rectosigmoid colon, measuring 8.5 x 8.4 cm in the rectum with mild wall thickening of the  rectosigmoid junction, as can be seen in early stercoral colitis. 3. Along the gastric antrum (axial 19), there is a 1.5 cm linear radiopaque structure, which may reflect an endoscopic clip or ingested foreign body. Clinical correlation requested. Electronically Signed   By: Wallie Char M.D.   On: 07/17/2023 21:40   DG Chest Port 1 View Result Date: 07/17/2023 CLINICAL DATA:  Questionable sepsis - evaluate for abnormality. Nausea and vomiting EXAM: PORTABLE CHEST 1 VIEW COMPARISON:  Radiograph 01/26/2022. FINDINGS: The heart is normal in size. Bandlike atelectasis at the left lung base. No confluent airspace disease. No pulmonary edema, large pleural effusion or pneumothorax. IMPRESSION: Bandlike atelectasis at the left lung base. Electronically Signed   By: Narda Rutherford M.D.   On: 07/17/2023 18:55     EKG: My personal interpretation of EKG shows:     Assessment/Plan: Principal Problem:   Sepsis ([name]) Active Problems:   Acute kidney injury superimposed on chronic kidney disease ([name])   Dry gangrene ([name])   Non-insulin dependent type 2 diabetes mellitus ([name])   Essential hypertension   Anemia of chronic disease   History of CVA (cerebrovascular accident)   Iron deficiency anemia   Dementia without behavioral disturbance ([name])   Reactive airway disease   History of dementia    Assessment and Plan: Sepsis Left-sided foot second digit dry gangrene-concern for underlying osteomyelitis -Patient presented emergency department complaining of nausea and vomiting and poor oral intake.  He is also complaining about left foot pain.  Underlying dementia.  Poor historian. -Physical exam showing left-sided third digit amputation and second digit blackish discoloration.  Presentation to ED patient is tachycardic and hypotensive.  CBC showing leukocytosis 20.7 otherwise unremarkable.  Elevated lactic acid 2.1. -Pending MRI of the left foot. - Concern for sepsis in the setting of left-sided  diabetic foot infection versus  underlying osteomyelitis. - Continue sepsis protocol.  In the ED patient has been given 1 L of LR bolus.  Giving another liter of LR bolus and continue LR 150 cc/h. - Continue broad-spectrum antibiotic coverage with IV vancomycin and cefepime. - Blood cultures are pending. - Need to follow-up with the MRI result. -Consulted podiatry Dr. Annamary Rummage request for evaluation.   Acute kidney injury superimposed CKD stage IIIb -CMP showing elevated creatinine 2.57 and low GFR.  Baseline creatinine around 1.3-1.4 and GFR been between 36-48. - Pending UA. - Prerenal acute kidney injury in the setting of nausea, vomiting and sepsis. - Continue IV fluid resuscitation.  Monitor renal function.  Avoid intoxication.  Chronic pancreatitis Nausea and vomiting - CT abdomen pelvis showing acute cystitis.  Moderate amount of stool burden, constipation andstercoral colitis.  Along the gastric antrum (axial 19), there is a 1.5 cm linear radiopaque structure, which may reflect an endoscopic clip or ingested foreign body. Clinical correlation requested. -Patient currently denying any nausea, vomiting and abdominal pain. -Continue   Non-insulin-dependent DM type II Essential hypertension History of CVA Iron deficiency anemia History of dementia  Reactive airway disease     DVT prophylaxis:  SQ Heparin Code Status:  DNR/DNI(Do NOT Intubate) Diet:  Family Communication:  *** Family was present at bedside, at the time of interview.  Opportunity was given to ask question and all questions were answered satisfactorily.  Disposition Plan:  ***  Consults:  ***  Admission status:   Inpatient, Telemetry bed  Severity of Illness: The appropriate patient status for this patient is INPATIENT. Inpatient status is judged to be reasonable and necessary in order to provide the required intensity of service to ensure the patient's safety. The patient's presenting symptoms, physical  exam findings, and initial radiographic and laboratory data in the context of their chronic comorbidities is felt to place them at high risk for further clinical deterioration. Furthermore, it is not anticipated that the patient will be medically stable for discharge from the hospital within 2 midnights of admission.   * I certify that at the point of admission it is my clinical judgment that the patient will require inpatient hospital care spanning beyond 2 midnights from the point of admission due to high intensity of service, high risk for further deterioration and high frequency of surveillance required.Marland Kitchen    Tereasa Coop, MD [facility]  How to contact the [facility] Attending or Consulting provider 7A - 7P or covering provider during after hours 7P -7A, for this patient.  Check the care team in [facility] and look for a) attending/consulting [facility] provider listed and b) the [facility] team listed Log into [email or web address] and use [place]'s universal password to access. If you do not have the password, please contact the hospital operator. Locate the [facility] provider you are looking for under [facility] and page to a number that you can be directly reached. If you still have difficulty reaching the provider, please page the [facility] (Director on Call) for the Hospitalists listed on amion for assistance.  07/17/2023, 11:56 PM

## 2023-07-17 NOTE — ED Provider Triage Note (Signed)
 Emergency Medicine Provider Triage Evaluation Note  Justin [name] , a 89 y.o. male  was evaluated in triage.  Pt complains of nausea vomiting.  Patient self has no specific complaints when asked directly but reportedly was having muscle episodes and nausea and vomiting since starting doxycycline about 4 days ago.  Patient is taken the doxycycline for infection of the foot.  He denies any other issues of concern such as tenderness in the abdomen, chest pain or shortness of breath..  Review of Systems  Positive: As above Negative: As above  Physical Exam  There were no vitals taken for this visit. Gen:   Awake, no distress  Resp:  Normal effort MSK:   Moves extremities without difficulty  Other:  No abdominal tenderness to palpation, capillary refill less than 2 seconds.  Medical Decision Making  Medically screening exam initiated at 1:56 PM.  Appropriate orders placed.  Justin [name] was informed that the remainder of the evaluation will be completed by another provider, this initial triage assessment does not replace that evaluation, and the importance of remaining in the ED until their evaluation is complete.     Smitty Knudsen, PA-C 07/17/23 1357

## 2023-07-17 NOTE — Sepsis Progress Note (Signed)
 Secure chat with bedside RN Grover Canavan. Pt was in MRI when phlebotomy and IV team came to get labs and IV start. Plan for them to come once pt back from radiology.

## 2023-07-18 DIAGNOSIS — E872 Acidosis, unspecified: Secondary | ICD-10-CM | POA: Insufficient documentation

## 2023-07-18 DIAGNOSIS — M8600 Acute hematogenous osteomyelitis, unspecified site: Secondary | ICD-10-CM | POA: Diagnosis not present

## 2023-07-18 DIAGNOSIS — I96 Gangrene, not elsewhere classified: Secondary | ICD-10-CM | POA: Diagnosis not present

## 2023-07-18 LAB — URINALYSIS, W/ REFLEX TO CULTURE (INFECTION SUSPECTED)
Bilirubin Urine: NEGATIVE
Glucose, UA: 50 mg/dL — AB
Ketones, ur: 5 mg/dL — AB
Nitrite: NEGATIVE
Protein, ur: 100 mg/dL — AB
RBC / HPF: >50 RBC/hpf (ref 0–5)
Specific Gravity, Urine: 1.014 (ref 1.005–1.030)
WBC, UA: >50 WBC/hpf (ref 0–5)
pH: 5 (ref 5.0–8.0)

## 2023-07-18 LAB — BLOOD CULTURE ID PANEL (REFLEXED) - BCID2

## 2023-07-18 LAB — COMPREHENSIVE METABOLIC PANEL WITH GFR
ALT: 24 U/L (ref 0–44)
AST: 26 U/L (ref 15–41)
Albumin: 2.1 g/dL — ABNORMAL LOW (ref 3.5–5.0)
Alkaline Phosphatase: 77 U/L (ref 38–126)
Anion gap: 14 (ref 5–15)
BUN: 53 mg/dL — ABNORMAL HIGH (ref 8–23)
CO2: 16 mmol/L — ABNORMAL LOW (ref 22–32)
Calcium: 8.2 mg/dL — ABNORMAL LOW (ref 8.9–10.3)
Chloride: 105 mmol/L (ref 98–111)
Creatinine, Ser: 2.47 mg/dL — ABNORMAL HIGH (ref 0.61–1.24)
GFR, Estimated: 25 mL/min — ABNORMAL LOW (ref >60)
Glucose, Bld: 249 mg/dL — ABNORMAL HIGH (ref 70–99)
Potassium: 4.5 mmol/L (ref 3.5–5.1)
Sodium: 135 mmol/L (ref 135–145)
Total Bilirubin: 0.8 mg/dL (ref 0.0–1.2)
Total Protein: 7.9 g/dL (ref 6.5–8.1)

## 2023-07-18 LAB — URINE CULTURE

## 2023-07-18 LAB — RESP PANEL BY RT-PCR (RSV, FLU A&B, COVID)  RVPGX2
Influenza A by PCR: NEGATIVE
Influenza B by PCR: NEGATIVE
Resp Syncytial Virus by PCR: NEGATIVE
SARS Coronavirus 2 by RT PCR: NEGATIVE

## 2023-07-18 LAB — CBC
HCT: 32.3 % — ABNORMAL LOW (ref 39.0–52.0)
Hemoglobin: 10.4 g/dL — ABNORMAL LOW (ref 13.0–17.0)
MCH: 30.7 pg (ref 26.0–34.0)
MCHC: 32.2 g/dL (ref 30.0–36.0)
MCV: 95.3 fL (ref 80.0–100.0)
Platelets: 332 10*3/uL (ref 150–400)
RBC: 3.39 MIL/uL — ABNORMAL LOW (ref 4.22–5.81)
RDW: 13.8 % (ref 11.5–15.5)
WBC: 15.8 10*3/uL — ABNORMAL HIGH (ref 4.0–10.5)
nRBC: 0 % (ref 0.0–0.2)

## 2023-07-18 LAB — LACTIC ACID, PLASMA: Lactic Acid, Venous: 1.7 mmol/L (ref 0.5–1.9)

## 2023-07-18 LAB — GLUCOSE, CAPILLARY
Glucose-Capillary: 221 mg/dL — ABNORMAL HIGH (ref 70–99)
Glucose-Capillary: 243 mg/dL — ABNORMAL HIGH (ref 70–99)
Glucose-Capillary: 152 mg/dL — ABNORMAL HIGH (ref 70–99)
Glucose-Capillary: 105 mg/dL — ABNORMAL HIGH (ref 70–99)

## 2023-07-18 LAB — HEMOGLOBIN A1C
Hgb A1c MFr Bld: 9.2 % — ABNORMAL HIGH (ref 4.8–5.6)
Mean Plasma Glucose: 217.34 mg/dL

## 2023-07-18 MED ORDER — POLYETHYLENE GLYCOL 3350 17 G PO PACK
17.0000 g | PACK | Freq: Two times a day (BID) | ORAL | Status: DC
  Administered 2023-07-18 – 2023-07-22 (×8): 17 g via ORAL
  Filled 2023-07-18 (×13): qty 1

## 2023-07-18 MED ORDER — INSULIN GLARGINE-YFGN 100 UNIT/ML ~~LOC~~ SOLN
10.0000 [IU] | Freq: Every day | SUBCUTANEOUS | Status: DC
  Administered 2023-07-18 – 2023-07-24 (×5): 10 [IU] via SUBCUTANEOUS
  Filled 2023-07-18 (×7): qty 0.1

## 2023-07-18 MED ORDER — SENNOSIDES-DOCUSATE SODIUM 8.6-50 MG PO TABS
1.0000 | ORAL_TABLET | Freq: Two times a day (BID) | ORAL | Status: DC
  Administered 2023-07-18 – 2023-07-22 (×8): 1 via ORAL
  Filled 2023-07-18 (×13): qty 1

## 2023-07-18 MED ORDER — IPRATROPIUM-ALBUTEROL 0.5-2.5 (3) MG/3ML IN SOLN: 3.0000 mL | RESPIRATORY_TRACT | Status: DC | PRN

## 2023-07-18 MED ORDER — LINEZOLID 600 MG PO TABS
600.0000 mg | ORAL_TABLET | Freq: Two times a day (BID) | ORAL | Status: DC
  Administered 2023-07-18 – 2023-07-22 (×10): 600 mg via ORAL
  Filled 2023-07-18 (×13): qty 1

## 2023-07-18 MED ORDER — IPRATROPIUM BROMIDE 0.02 % IN SOLN: 0.5000 mg | Freq: Four times a day (QID) | RESPIRATORY_TRACT | Status: DC | PRN

## 2023-07-18 MED ORDER — METOPROLOL TARTRATE 5 MG/5ML IV SOLN: 5.0000 mg | INTRAVENOUS | Status: DC | PRN

## 2023-07-18 MED ORDER — SODIUM CHLORIDE 0.9 % IV SOLN: INTRAVENOUS | Status: AC

## 2023-07-18 MED ORDER — GUAIFENESIN 100 MG/5ML PO LIQD: 5.0000 mL | ORAL | Status: DC | PRN

## 2023-07-18 MED ORDER — SODIUM BICARBONATE 650 MG PO TABS
650.0000 mg | ORAL_TABLET | Freq: Two times a day (BID) | ORAL | Status: DC
  Administered 2023-07-18 – 2023-07-22 (×9): 650 mg via ORAL
  Filled 2023-07-18 (×13): qty 1

## 2023-07-18 MED ORDER — ALBUTEROL SULFATE (2.5 MG/3ML) 0.083% IN NEBU: 2.5000 mg | INHALATION_SOLUTION | Freq: Four times a day (QID) | RESPIRATORY_TRACT | Status: DC | PRN

## 2023-07-18 MED ORDER — GLUCAGON HCL RDNA (DIAGNOSTIC) 1 MG IJ SOLR: 1.0000 mg | INTRAMUSCULAR | Status: DC | PRN

## 2023-07-18 MED ORDER — HYDRALAZINE HCL 20 MG/ML IJ SOLN: 10.0000 mg | INTRAMUSCULAR | Status: DC | PRN

## 2023-07-18 MED ORDER — SENNOSIDES-DOCUSATE SODIUM 8.6-50 MG PO TABS
1.0000 | ORAL_TABLET | Freq: Every evening | ORAL | Status: DC | PRN
  Administered 2023-07-20: 1 via ORAL

## 2023-07-18 NOTE — Progress Notes (Signed)
 PROGRESS NOTE    Justin [name]  ZOX:[id number] DOB: 1934-11-24 DOA: 07/17/2023 PCP: Sharlet Salina, MD    Brief Narrative:   89 year old with history of CKD 3B, chronic pancreatitis, HTN, DM2, HLD, chronic iron deficiency anemia, adenocarcinoma, CVA with right-sided hemiparesis, TIA, vascular/Alzheimer dementia, left TMA, left third digit amputation 06/19/2023 admitted for nausea and vomiting.  He is currently on doxycycline for left foot infection.  CT abdomen pelvis showing acute cystitis with concern for constipation/early sterile colitis.  Podiatry has been consulted.  Assessment & Plan:  Principal Problem:   Sepsis ([name]) Active Problems:   Acute kidney injury superimposed on chronic kidney disease ([name])   Dry gangrene ([name])   Non-insulin dependent type 2 diabetes mellitus ([name])   Chronic pancreatitis ([name])   Essential hypertension   Anemia of chronic disease   History of CVA (cerebrovascular accident)   Iron deficiency anemia   Dementia without behavioral disturbance ([name])   Reactive airway disease   History of dementia   Metabolic acidosis   Sepsis Left-sided foot second digit dry gangrene-concern for underlying osteomyelitis History of right metatarsal amputation Sepsis physiology is improving.  There is concerns of osteomyelitis - MRI of the left foot is currently pending - Continue empiric cefepime.  Due to AKI, change vancomycin to linezolid - Podiatry has been consulted     Acute kidney injury superimposed CKD stage IIIb Urinary tract infection -Admission creatinine 2.57, baseline 1.7.  Continue IV fluids -Continue antibiotics for UTI   High anion metabolic acidosis - Likely from underlying renal issues.  Bicarb around 15, on sodium bicarb twice daily tablets   Chronic pancreatitis Nausea and vomiting - Supportive care.  Continue Creon  Constipation with possible early steroid colitis - Aggressive bowel regimen, enema     Non-insulin-dependent DM  type type II - Continue sliding scale SSI with mealtime coverage.  A1c 3 months ago 8.8, will repeat   Essential hypertension - Holding home medications.  IV as needed   History of CVA -Continue aspirin and Lipitor.   Iron deficiency anemia Iron supplements with bowel regimen   History of dementia without behavioral disturbance. - Depakote twice daily, Zoloft daily.  Atarax as needed    Reactive airway disease -Stable.  Continue Breo Ellipta and Combivent as needed   DVT prophylaxis: SQ Hep    Code Status: Limited: Do not attempt resuscitation (DNR) -DNR-LIMITED -Do Not Intubate/DNI  Family Communication: Children at bedside Status is: Inpatient Remains inpatient appropriate because: Ongoing management for osteomyelitis    Subjective: Patient seen and examined at bedside.  Overall very poor historian due to his dementia.  2 officials are present at bedside   Examination:  General exam: Appears calm and comfortable, cachectic frail Respiratory system: Clear to auscultation. Respiratory effort normal. Cardiovascular system: S1 & S2 heard, RRR. No JVD, murmurs, rubs, gallops or clicks. No pedal edema. Gastrointestinal system: Abdomen is nondistended, soft and nontender. No organomegaly or masses felt. Normal bowel sounds heard. Central nervous system: Alert and oriented to name only Extremities: Symmetric 4 x 5 power. Skin: No rashes, lesions or ulcers Psychiatry: Judgement and insight appear poor Third toe amputation of his left foot noted. -Redness appearing left great toe               Diet Orders (From admission, onward)     Start     Ordered   07/17/23 2341  Diet heart healthy/carb modified Room service appropriate? Yes; Fluid consistency: Thin  Diet effective now  Question Answer Comment  Diet-HS Snack? Nothing   Room service appropriate? Yes   Fluid consistency: Thin      07/17/23 2340            Objective: Vitals:   07/17/23 2154  07/18/23 0137 07/18/23 0513 07/18/23 0738  BP:  (!) 131/55 131/63 (!) 148/65  Pulse:  85 90 (!) 107  Resp:  18 18 16   Temp: 98 F (36.7 C) 98 F (36.7 C) 97.8 F (36.6 C) 98.5 F (36.9 C)  TempSrc: Oral   Oral  SpO2:  99% 99% 100%  Weight:      Height:        Intake/Output Summary (Last 24 hours) at 07/18/2023 1150 Last data filed at 07/18/2023 0300 Gross per 24 hour  Intake 1096.54 ml  Output --  Net 1096.54 ml   Filed Weights   07/17/23 1458  Weight: 68 kg    Scheduled Meds:  aspirin  81 mg Oral Daily   atorvastatin  40 mg Oral QHS   divalproex  125 mg Oral BID   ferrous gluconate  324 mg Oral Daily   fluticasone furoate-vilanterol  1 puff Inhalation Daily   gabapentin  100 mg Oral BID   heparin  5,000 Units Subcutaneous Q8H   insulin aspart  0-5 Units Subcutaneous QHS   insulin aspart  0-6 Units Subcutaneous TID WC   insulin glargine-yfgn  10 Units Subcutaneous Daily   linezolid  600 mg Oral Q12H   lipase/protease/amylase  24,000 Units Oral TID   pantoprazole  40 mg Oral Daily   polyethylene glycol  17 g Oral BID   senna-docusate  1 tablet Oral BID   sertraline  25 mg Oral Daily   sodium bicarbonate  650 mg Oral BID   sodium chloride flush  3 mL Intravenous Q12H   sodium chloride flush  3 mL Intravenous Q12H   Continuous Infusions:  sodium chloride     sodium chloride 75 mL/hr at 07/18/23 0833   ceFEPime (MAXIPIME) IV      Nutritional status     Body mass index is 19.26 kg/m.  Data Reviewed:   CBC: Recent Labs  Lab 07/17/23 1420 07/18/23 0503  WBC 20.7* 15.8*  NEUTROABS 17.3*  --   HGB 12.5* 10.4*  HCT 40.2 32.3*  MCV 99.3 95.3  PLT 408* 332   Basic Metabolic Panel: Recent Labs  Lab 07/17/23 1420 07/18/23 0503  NA 135 135  K 5.0 4.5  CL 103 105  CO2 15* 16*  GLUCOSE 244* 249*  BUN 50* 53*  CREATININE 2.57* 2.47*  CALCIUM 8.6* 8.2*   GFR: Estimated Creatinine Clearance: 20.6 mL/min (A) (by C-G formula based on SCr of 2.47 mg/dL  (H)). Liver Function Tests: Recent Labs  Lab 07/17/23 1420 07/18/23 0503  AST 31 26  ALT 29 24  ALKPHOS 80 77  BILITOT 0.8 0.8  PROT 8.9* 7.9  ALBUMIN 2.3* 2.1*   No results for input(s): "LIPASE", "AMYLASE" in the last 168 hours. No results for input(s): "AMMONIA" in the last 168 hours. Coagulation Profile: Recent Labs  Lab 07/17/23 2020  INR 1.6*   Cardiac Enzymes: No results for input(s): "CKTOTAL", "CKMB", "CKMBINDEX", "TROPONINI" in the last 168 hours. BNP (last 3 results) No results for input(s): "PROBNP" in the last 8760 hours. HbA1C: Recent Labs    07/18/23 0503  HGBA1C 9.2*   CBG: Recent Labs  Lab 07/18/23 0747  GLUCAP 221*   Lipid Profile: No results for input(s): "  CHOL", "HDL", "LDLCALC", "TRIG", "CHOLHDL", "LDLDIRECT" in the last 72 hours. Thyroid Function Tests: No results for input(s): "TSH", "T4TOTAL", "FREET4", "T3FREE", "THYROIDAB" in the last 72 hours. Anemia Panel: No results for input(s): "VITAMINB12", "FOLATE", "FERRITIN", "TIBC", "IRON", "RETICCTPCT" in the last 72 hours. Sepsis Labs: Recent Labs  Lab 07/17/23 2027 07/18/23 0503  LATICACIDVEN 2.1* 1.7    Recent Results (from the past 240 hours)  Blood Culture (routine x 2)     Status: None (Preliminary result)   Collection Time: 07/17/23  8:20 PM   Specimen: BLOOD RIGHT FOREARM  Result Value Ref Range Status   Specimen Description BLOOD RIGHT FOREARM  Final   Special Requests   Final    BOTTLES DRAWN AEROBIC AND ANAEROBIC Blood Culture adequate volume   Culture   Final    NO GROWTH < 12 HOURS Performed at [facility], [address] [place], [place] [address]    Report Status PENDING  Incomplete  Blood Culture (routine x 2)     Status: None (Preliminary result)   Collection Time: 07/17/23  8:55 PM   Specimen: BLOOD RIGHT FOREARM  Result Value Ref Range Status   Specimen Description BLOOD RIGHT FOREARM  Final   Special Requests   Final    BOTTLES DRAWN AEROBIC AND  ANAEROBIC Blood Culture results may not be optimal due to an inadequate volume of blood received in culture bottles   Culture   Final    NO GROWTH < 12 HOURS Performed at [facility], [address], [place], [place] [address]    Report Status PENDING  Incomplete  Resp panel by RT-PCR (RSV, Flu A&B, Covid) Anterior Nasal Swab     Status: None   Collection Time: 07/17/23 11:36 PM   Specimen: Anterior Nasal Swab  Result Value Ref Range Status   SARS Coronavirus 2 by RT PCR NEGATIVE NEGATIVE Final   Influenza A by PCR NEGATIVE NEGATIVE Final   Influenza B by PCR NEGATIVE NEGATIVE Final    Comment: (NOTE) The Xpert Xpress SARS-CoV-2/FLU/RSV plus assay is intended as an aid in the diagnosis of influenza from Nasopharyngeal swab specimens and should not be used as a sole basis for treatment. Nasal washings and aspirates are unacceptable for Xpert Xpress SARS-CoV-2/FLU/RSV testing.  Fact Sheet for Patients: BloggerCourse.com  Fact Sheet for Healthcare Providers: SeriousBroker.it  This test is not yet approved or cleared by the [place] FDA and has been authorized for detection and/or diagnosis of SARS-CoV-2 by FDA under an Emergency Use Authorization (EUA). This EUA will remain in effect (meaning this test can be used) for the duration of the COVID-19 declaration under Section 564(b)(1) of the Act, 21 U.S.C. section 360bbb-3(b)(1), unless the authorization is terminated or revoked.     Resp Syncytial Virus by PCR NEGATIVE NEGATIVE Final    Comment: (NOTE) Fact Sheet for Patients: BloggerCourse.com  Fact Sheet for Healthcare Providers: SeriousBroker.it  This test is not yet approved or cleared by the [place] FDA and has been authorized for detection and/or diagnosis of SARS-CoV-2 by FDA under an Emergency Use Authorization (EUA). This EUA will remain in effect  (meaning this test can be used) for the duration of the COVID-19 declaration under Section 564(b)(1) of the Act, 21 U.S.C. section 360bbb-3(b)(1), unless the authorization is terminated or revoked.  Performed at [facility], [address], [place], [place] [address]          Radiology Studies: MR FOOT LEFT WO CONTRAST  Result Date: 07/18/2023 CLINICAL DATA:  Soft tissue infection suspected. EXAM: MRI OF THE LEFT FOOT WITHOUT CONTRAST TECHNIQUE: Multiplanar, multisequence MR imaging of the left forefoot was performed. No intravenous contrast was administered. The technologist reports the patient had altered mental status and kept moving his foot out of the coil. The best possible images were performed. COMPARISON:  Left foot radiographs 06/26/2023, 04/19/2023; MRI left forefoot 04/20/2023 FINDINGS: Despite efforts by the technologist and patient, moderate to high-grade motion artifact is present on today's exam and could not be eliminated. This reduces exam sensitivity and specificity. Bones/Joint/Cartilage Interval amputation of the third toe phalanges compared to 04/20/2023 MRI, as also seen on 06/26/2023 radiographs. No sagittal STIR image was performed, likely due to patient motion patient condition described by the technologist. On the sequence with the greatest uniformity of fat suppression, the coronal STIR series 10, there is again increased signal within the distal aspect of the distal phalanx of the great toe (coronal series 10, image 22) with again a soft tissue defect within the adjacent distal great toe, again highly suggestive of active osteomyelitis. No definite osteomyelitis is seen within the second, fourth, or fifth toe phalanges. Moderate great toe metatarsophalangeal osteoarthritis including cartilage thinning, subchondral marrow edema, and peripheral osteophytosis. Ligaments The Lisfranc ligament complex is grossly intact. Muscles and Tendons There is moderate fluid around  the second and third flexor tendons at the level of the proximal through distal aspect of the metatarsal shaft of the third digit and the mid to distal metatarsal shaft of the second digit (axial series 11 images 4 through 16 and coronal series 10 images 27 and 28). This is increased from prior 04/20/2023 MRI. Soft tissues There is mild subcutaneous fat edema and swelling greatest within the plantar medial forefoot, plantar to the distal first metatarsal and plantar medial aspect of the first toe phalanges. IMPRESSION: Compared to 04/20/2023 MRI: 1. Interval amputation of the third toe phalanges. 2. High-grade motion artifact. On the sequence with the best preserved uniformity of fat suppression, the coronal STIR image, there is again increased signal within the distal aspect of the distal phalanx of the great toe with again a soft tissue ulcer within the adjacent distal great toe, again highly suggestive of active osteomyelitis. 3. Moderate fluid around the second and third flexor tendons at the level of the proximal through distal aspect of the metatarsal shaft of the third digit and the mid to distal metatarsal shaft of the second digit. This tenosynovitis is increased from prior 04/20/2023 MRI. Electronically Signed   By: Neita Garnet M.D.   On: 07/18/2023 10:00   CT ABDOMEN PELVIS WO CONTRAST Result Date: 07/17/2023 CLINICAL DATA:  Abdominal pain, acute, nonlocalized, nausea and vomiting EXAM: CT ABDOMEN AND PELVIS WITHOUT CONTRAST TECHNIQUE: Multidetector CT imaging of the abdomen and pelvis was performed following the standard protocol without IV contrast. Of note, the lack of intravenous contrast limits evaluation of the solid organ parenchyma and vascularity. RADIATION DOSE REDUCTION: This exam was performed according to the departmental dose-optimization program which includes automated exposure control, adjustment of the mA and/or kV according to patient size and/or use of iterative reconstruction  technique. COMPARISON:  December 05, 2022, January 03, 2013 FINDINGS: Lower chest: No focal airspace consolidation or pleural effusion.Fibro linear scarring and subsegmental atelectasis in the lung bases. Dense multi-vessel coronary atherosclerosis. Hepatobiliary: Similar clustered chunky parenchymal calcification centered primarily within segment 7 of the liver, which given the stability, this is benign.Cholecystectomy.No intrahepatic or extrahepatic biliary ductal dilation.  Pancreas: atrophy of the pancreatic parenchyma. No mass or ductal dilation.No peripancreatic inflammation or fluid collection. Spleen: Normal size. No mass. Adrenals/Urinary Tract: Unchanged nodularity of the left adrenal gland without discrete mass or dominant nodule. No renal mass. Extensive renal hilar vascular calcification. No hydronephrosis or nephrolithiasis. Decompressed urinary bladder with perivesicular stranding. Stomach/Bowel: Moderate amount of ingested material in the stomach. 1.5 cm radiopaque structure along the gastric antrum (axial 19). No small bowel wall thickening or inflammation. No small bowel obstruction. Normal appendix. Moderate amount of fecal material throughout the proximal colon. There is a moderate column of formed stool in the rectosigmoid colon measuring approximately 8.5 by 8.4 cm in the rectum. Mild wall thickening at the rectosigmoid colon junction. Vascular/Lymphatic: No aortic aneurysm. Diffuse aortoiliac atherosclerosis. No intraabdominal or pelvic lymphadenopathy. Reproductive: No prostatomegaly.No free pelvic fluid. Other: No pneumoperitoneum, ascites, or mesenteric inflammation. Musculoskeletal: No acute fracture or destructive lesion.Diffuse osteopenia. Chronic compression fractures of L1 and L3, unchanged. Multilevel degenerative disc disease of the spine. Moderate to severe bilateral hip osteoarthritis. IMPRESSION: 1. Findings worrisome for acute cystitis. Correlation with urinalysis recommended. 2.  Moderate amount of fecal material throughout the colon, as can be seen in constipation. A significant column of formed stool noted in the rectosigmoid colon, measuring 8.5 x 8.4 cm in the rectum with mild wall thickening of the rectosigmoid junction, as can be seen in early stercoral colitis. 3. Along the gastric antrum (axial 19), there is a 1.5 cm linear radiopaque structure, which may reflect an endoscopic clip or ingested foreign body. Clinical correlation requested. Electronically Signed   By: Wallie Char M.D.   On: 07/17/2023 21:40   DG Chest Port 1 View Result Date: 07/17/2023 CLINICAL DATA:  Questionable sepsis - evaluate for abnormality. Nausea and vomiting EXAM: PORTABLE CHEST 1 VIEW COMPARISON:  Radiograph 01/26/2022. FINDINGS: The heart is normal in size. Bandlike atelectasis at the left lung base. No confluent airspace disease. No pulmonary edema, large pleural effusion or pneumothorax. IMPRESSION: Bandlike atelectasis at the left lung base. Electronically Signed   By: Narda Rutherford M.D.   On: 07/17/2023 18:55           LOS: 1 day   Time spent= 35 mins    Miguel Rota, MD [facility]  If 7PM-7AM, please contact night-coverage  07/18/2023, 11:50 AM

## 2023-07-18 NOTE — Consult Note (Signed)
 PODIATRY CONSULTATION  NAME Justin [name] MRN [id number] DOB 11/17/1934 DOA 07/17/2023   Reason for consult: Gangrene L foot  Attending/Consulting physician: A. Amin MD  History of present illness: "Justin [name] is a 89 y.o. male with medical history significant of left third digit amputation on 06/19/23, CKD stage IIIb, chronic pancreatitis, essential hypertension, insulin-dependent DM type II, hypercholesteremia, chronic iron deficiency anemia, chronic adenocarcinoma 2015, history of CVA with residual right-sided hemiparesis, TIA, mixed vascular and Alzheimer's dementia and left TMA presented to emergency department complaining of vomiting and nausea.  Patient reported started vomiting this morning.  He has been currently treating with doxycycline for left foot infection."  Pt known to me and Dr. Allena Katz from office and prior admission. At last admission I recommend L hallux amp for osteomyelitis but the patient and family decided they did not want any more surgical intervention. I had previously discussed the infection could make him sick and lead to loss of limb/life. They understood this and wanted to continue wound care and follow up outpatient. He had 3rd toe amputation 06/19/23 possibly at surgery center? He tells me this AM that he does not want any more surgery.     Past Medical History:  Diagnosis Date   Acute metabolic encephalopathy 05/24/2021   Arthritis    Diabetes mellitus    HTN (hypertension)    Hypercholesteremia    Iron deficiency    Pancreatitis, acute 06/02/2012   Diagnosed 05/2012    Personal history of colonic adenomas 04/15/2013   04/15/2013 2 diminutive polyps     Stroke [facility])    TIA (transient ischemic attack)        Latest Ref Rng & Units 07/18/2023    5:03 AM 07/17/2023    2:20 PM 04/23/2023    3:59 AM  CBC  WBC 4.0 - 10.5 K/uL 15.8  20.7  8.7   Hemoglobin 13.0 - 17.0 g/dL 40.9  81.1  8.3   Hematocrit 39.0 - 52.0 % 32.3  40.2  24.7   Platelets 150 - 400  K/uL 332  408  178        Latest Ref Rng & Units 07/18/2023    5:03 AM 07/17/2023    2:20 PM 04/23/2023    3:59 AM  BMP  Glucose 70 - 99 mg/dL 914  782  956   BUN 8 - 23 mg/dL 53  50  19   Creatinine 0.61 - 1.24 mg/dL 2.13  0.86  5.78   Sodium 135 - 145 mmol/L 135  135  133   Potassium 3.5 - 5.1 mmol/L 4.5  5.0  3.7   Chloride 98 - 111 mmol/L 105  103  103   CO2 22 - 32 mmol/L 16  15  23    Calcium 8.9 - 10.3 mg/dL 8.2  8.6  7.8       Physical Exam: Lower Extremity Exam Vasc:     .                L - PT non palpable, DP 1/4 palpable. Cap refill absent to hallux   Derm:    R -TMA without open wound               L -necrosis of the distal tuft of the left hallux.  Eschar in place.  No active drainage.  Mild erythema and edema of the toe. Wound at prior 3rd toe amputation site. Gangrenes changes to dorsal left 2nd toe  MSK:     R - prior TMA appears well-healed               L -  Prior 3rd toe amp   Neuro:R - Gross sensation diminished. Gross motor function intact                 L - Gross sensation diminished. Gross motor function intact    ASSESSMENT/PLAN OF CARE 89 y.o. male with PMHx significant for DM2 with PVD and neuropathy HTN HLD CKD history of CVA dementia with left hallux osteomyelitis and gangrenous changes   MRI L foot: pending, suspect additional areas of osteomyelitis compared to prior study Prior MRI with L hallux OM  - Pt would require left foot trans metatarsal amputation at this time if surgery is considered. Though per my discussion with the patient this AM and previous discussion with family member Justin [name] 04/20/23 no more surgical intervention is desired. This will need to be discussed with [name] again. If  no surgery I recommend palliative care as the Left foot is causing pt to become septic, prognosis poor without source control.  - Right heel not evaluated at this visit, per picture taken yesterday appears healthy superficial wound without  infection.  - Abx per primary/ID  - Anticoagulation: ok to continue per primary - Wound care: Appreciate WOCN recs - WB status: WBAT - Will continue to follow   Thank you for the consult.  Please contact me directly with any questions or concerns.           Corinna Gab, DPM [facility]    [address] [place], [place] [address]                Office [phone]  Fax [phone]

## 2023-07-18 NOTE — Hospital Course (Addendum)
 89 year old with history of CKD 3B, chronic pancreatitis, HTN, DM2, HLD, chronic iron deficiency anemia, adenocarcinoma, CVA with right-sided hemiparesis, TIA, vascular/Alzheimer dementia, left TMA, left third digit amputation 06/19/2023 admitted for nausea and vomiting. He is currently on doxycycline for left foot infection. CT abdomen pelvis showing acute cystitis with concern for constipation/early sterile colitis. Podiatry has been consulted. MRI is consistent with osteomyelitis

## 2023-07-18 NOTE — Consult Note (Signed)
 WOC Nurse Consult Note: patient has been followed by vascular surgeon for PAD and podiatry for osteomyelitis; had surgical amputation of L 3rd toe by podiatry; last seen in podiatry office 07/10/2023 with orders for Betadine WTD to amputation site  Reason for Consult: diabetic foot wound  Wound type: 1. full thickness L great toe and 2nd digit r/t ischemia; full thickness L 3rd digit post amputation  2. Unstageable Pressure Injury R heel  Pressure Injury POA: Yes Measurement: see nursing flowsheet  Wound bed: 1.  R heel small area of eschar  2.  L great and 2nd digits with dry eschar  3.  Amputation site L 3rd digit with eschar/dry necrotic tissue  Drainage (amount, consistency, odor) appears dry  Periwound: peeling skin  Dressing procedure/placement/frequency:  Cleanse L foot toe wounds with soap and water, dry and paint L great toe and 2nd digit with Betadine daily.  Apply Betadine wet to dry dressing to amputation site of L 3rd digit daily.  Cover entire area with dry gauze and secure with Kerlix roll gauze. May apply Ace wrap over Kerlix.  Cleanse R heel with soap and water, dry and apply Xeroform gauze [place] [phone]) to wound daily.  Place R heel into Prevalon boot to offload pressure.  POC discussed with bedside nurse. WOC team will not follow. Re-consult if further needs arise.   Thank you,    Priscella Mann MSN, RN-BC, [employer] [phone]

## 2023-07-18 NOTE — Progress Notes (Signed)
RT instructed patient and family on the use of a flutter valve. Patient was able to demonstrate back good technique.

## 2023-07-18 NOTE — Progress Notes (Signed)
 PHARMACY - PHYSICIAN COMMUNICATION CRITICAL VALUE ALERT - BLOOD CULTURE IDENTIFICATION (BCID)  Justin [name] is an 89 y.o. male who presented to [facility] on 07/17/2023 with L. Foot wound infection and concerns for sepsis  Assessment:  1/4 blood cultures staph spp (no resistance)  Name of physician (or Provider) Contacted: Dr. Toniann Fail  Current antibiotics: Cefepime 2g IV every 24 hours +linezolid 600mg  IV every 12 hours  Changes to prescribed antibiotics recommended:   -Continue current regimen   Results for orders placed or performed during the hospital encounter of 07/17/23  Blood Culture ID Panel (Reflexed) (Collected: 07/17/2023  8:20 PM)  Result Value Ref Range   Enterococcus faecalis NOT DETECTED NOT DETECTED   Enterococcus Faecium NOT DETECTED NOT DETECTED   Listeria monocytogenes NOT DETECTED NOT DETECTED   Staphylococcus species DETECTED (A) NOT DETECTED   Staphylococcus aureus (BCID) NOT DETECTED NOT DETECTED   Staphylococcus epidermidis NOT DETECTED NOT DETECTED   Staphylococcus lugdunensis NOT DETECTED NOT DETECTED   Streptococcus species NOT DETECTED NOT DETECTED   Streptococcus agalactiae NOT DETECTED NOT DETECTED   Streptococcus pneumoniae NOT DETECTED NOT DETECTED   Streptococcus pyogenes NOT DETECTED NOT DETECTED   A.calcoaceticus-baumannii NOT DETECTED NOT DETECTED   Bacteroides fragilis NOT DETECTED NOT DETECTED   Enterobacterales NOT DETECTED NOT DETECTED   Enterobacter cloacae complex NOT DETECTED NOT DETECTED   Escherichia coli NOT DETECTED NOT DETECTED   Klebsiella aerogenes NOT DETECTED NOT DETECTED   Klebsiella oxytoca NOT DETECTED NOT DETECTED   Klebsiella pneumoniae NOT DETECTED NOT DETECTED   Proteus species NOT DETECTED NOT DETECTED   Salmonella species NOT DETECTED NOT DETECTED   Serratia marcescens NOT DETECTED NOT DETECTED   Haemophilus influenzae NOT DETECTED NOT DETECTED   Neisseria meningitidis NOT DETECTED NOT DETECTED   Pseudomonas  aeruginosa NOT DETECTED NOT DETECTED   Stenotrophomonas maltophilia NOT DETECTED NOT DETECTED   Candida albicans NOT DETECTED NOT DETECTED   Candida auris NOT DETECTED NOT DETECTED   Candida glabrata NOT DETECTED NOT DETECTED   Candida krusei NOT DETECTED NOT DETECTED   Candida parapsilosis NOT DETECTED NOT DETECTED   Candida tropicalis NOT DETECTED NOT DETECTED   Cryptococcus neoformans/gattii NOT DETECTED NOT DETECTED    Arabella Merles, PharmD. Clinical Pharmacist 07/18/2023 11:13 PM

## 2023-07-19 ENCOUNTER — Inpatient Hospital Stay (HOSPITAL_COMMUNITY)

## 2023-07-19 ENCOUNTER — Other Ambulatory Visit: Payer: Self-pay | Admitting: Podiatry

## 2023-07-19 DIAGNOSIS — I7090 Unspecified atherosclerosis: Secondary | ICD-10-CM

## 2023-07-19 DIAGNOSIS — M8600 Acute hematogenous osteomyelitis, unspecified site: Secondary | ICD-10-CM | POA: Diagnosis not present

## 2023-07-19 LAB — CBC
HCT: 28.1 % — ABNORMAL LOW (ref 39.0–52.0)
Hemoglobin: 9.2 g/dL — ABNORMAL LOW (ref 13.0–17.0)
MCH: 30.9 pg (ref 26.0–34.0)
MCHC: 32.7 g/dL (ref 30.0–36.0)
MCV: 94.3 fL (ref 80.0–100.0)
Platelets: 311 10*3/uL (ref 150–400)
RBC: 2.98 MIL/uL — ABNORMAL LOW (ref 4.22–5.81)
RDW: 13.8 % (ref 11.5–15.5)
WBC: 13.4 10*3/uL — ABNORMAL HIGH (ref 4.0–10.5)
nRBC: 0 % (ref 0.0–0.2)

## 2023-07-19 LAB — BASIC METABOLIC PANEL WITH GFR
Anion gap: 8 (ref 5–15)
BUN: 39 mg/dL — ABNORMAL HIGH (ref 8–23)
CO2: 20 mmol/L — ABNORMAL LOW (ref 22–32)
Calcium: 8.2 mg/dL — ABNORMAL LOW (ref 8.9–10.3)
Chloride: 110 mmol/L (ref 98–111)
Creatinine, Ser: 1.96 mg/dL — ABNORMAL HIGH (ref 0.61–1.24)
GFR, Estimated: 33 mL/min — ABNORMAL LOW (ref >60)
Glucose, Bld: 110 mg/dL — ABNORMAL HIGH (ref 70–99)
Potassium: 3.8 mmol/L (ref 3.5–5.1)
Sodium: 138 mmol/L (ref 135–145)

## 2023-07-19 LAB — GLUCOSE, CAPILLARY
Glucose-Capillary: 90 mg/dL (ref 70–99)
Glucose-Capillary: 241 mg/dL — ABNORMAL HIGH (ref 70–99)
Glucose-Capillary: 107 mg/dL — ABNORMAL HIGH (ref 70–99)
Glucose-Capillary: 61 mg/dL — ABNORMAL LOW (ref 70–99)
Glucose-Capillary: 81 mg/dL (ref 70–99)

## 2023-07-19 LAB — VAS US ABI WITH/WO TBI
Left ABI: 0.89
Right ABI: 0.95

## 2023-07-19 LAB — MAGNESIUM: Magnesium: 1.4 mg/dL — ABNORMAL LOW (ref 1.7–2.4)

## 2023-07-19 MED ORDER — MAGNESIUM SULFATE 4 GM/100ML IV SOLN
4.0000 g | Freq: Once | INTRAVENOUS | Status: AC
  Administered 2023-07-19: 4 g via INTRAVENOUS
  Filled 2023-07-19: qty 100

## 2023-07-19 MED ORDER — MAGNESIUM OXIDE -MG SUPPLEMENT 400 (240 MG) MG PO TABS
800.0000 mg | ORAL_TABLET | Freq: Once | ORAL | Status: AC
  Administered 2023-07-19: 800 mg via ORAL
  Filled 2023-07-19: qty 2

## 2023-07-19 NOTE — Progress Notes (Signed)
 Taken at [employer]

## 2023-07-19 NOTE — Progress Notes (Signed)
 Pharmacy Antibiotic Note  Justin [name] is a 89 y.o. male admitted on 07/17/2023 with sepsis.  Prior to presentation, pt taking doxycycline since 4/4 for L foot wound infection. MRI L foot highly suggestive of active osteomyelitis distal great toe. Pharmacy has been consulted for cefepime dosing.  SCr improving and down to 1.96 (baseline ~1.7). BCx shows 1/4 bottles of Staph capitis. UCx shows multiple species. He is afebrile, WBC trended down to 13.4.  Plan: Continue cefepime 2g IV q24h Continue linezolid 600 mg PO q12h  Monitor renal function, clinical progress, cultures/sensitivities F/U LOT and de-escalate as able  Height: 6\' 2"  (188 cm) Weight: 68 kg (150 lb) IBW/kg (Calculated) : 82.2  Temp (24hrs), Avg:97.9 F (36.6 C), Min:97.7 F (36.5 C), Max:98.3 F (36.8 C)  Recent Labs  Lab 07/17/23 1420 07/17/23 2027 07/18/23 0503 07/19/23 0632  WBC 20.7*  --  15.8* 13.4*  CREATININE 2.57*  --  2.47* 1.96*  LATICACIDVEN  --  2.1* 1.7  --     Estimated Creatinine Clearance: 26 mL/min (A) (by C-G formula based on SCr of 1.96 mg/dL (H)).    No Known Allergies  Antimicrobials this admission: Cefepime 4/9 >> Vancomycin 4/9 x1 Flagyl 4/9 x1 Linezolid 4/10 >>  Microbiology results: 4/9 Bcx: 1/4 Staph capitis 4/9 UCx: mult species  Thank you for involving pharmacy in this patient's care.  Loura Back, PharmD, BCPS Clinical Pharmacist Clinical phone for 07/19/2023 is [phone] 07/19/2023 10:07 AM

## 2023-07-19 NOTE — Care Management Important Message (Signed)
 Important Message  Patient Details  Name: Justin [name] MRN: [id number] Date of Birth: [birth year]   Important Message Given:  Yes - Medicare IM     Dorena Bodo 07/19/2023, 4:35 PM

## 2023-07-19 NOTE — TOC Initial Note (Addendum)
 Transition of Care [facility]) - Initial/Assessment Note    Patient Details  Name: Justin [name] MRN: [id number] Date of Birth: 11-Feb-1935  [facility] CM/SW Contact:    Deatra [place], [place] Phone Number: 07/19/2023, 1:35 PM  Clinical Narrative:  Pt admitted from [facility] where he is a LTC/SNF resident. Spoke to [facility] [place] who confirmed pt able to return. Spoke to pt's dtr [name] who reports plan is for pt to return to [facility] at [facility]. SW will follow.   Dellie Burns, MSW, LCSW [phone] (coverage)                   Expected Discharge Plan: Skilled Nursing Facility Barriers to Discharge: Continued Medical Work up   Patient Goals and CMS Choice            Expected Discharge Plan and Services     Post Acute Care Choice: Skilled Nursing Facility Living arrangements for the past 2 months: Skilled Nursing Facility                                      Prior Living Arrangements/Services Living arrangements for the past 2 months: Skilled Nursing Facility Lives with:: Facility Resident Patient language and need for interpreter reviewed:: Yes Do you feel safe going back to the place where you live?: Yes      Need for Family Participation in Patient Care: Yes (Comment) Care giver support system in place?: Yes (comment)   Criminal Activity/Legal Involvement Pertinent to Current Situation/Hospitalization: No - Comment as needed  Activities of Daily Living      Permission Sought/Granted Permission sought to share information with : Facility Industrial/product designer granted to share information with : Yes, Verbal Permission Granted              Emotional Assessment       Orientation: : Oriented to Self Alcohol / Substance Use: Not Applicable Psych Involvement: No (comment)  Admission diagnosis:  AKI (acute kidney injury) ([name]) [N17.9] Sepsis ([name]) [A41.9] Gangrene of toe of left foot ([name]) [I96] Patient Active  Problem List   Diagnosis Date Noted   Metabolic acidosis 07/18/2023   Gangrene of toe of left foot ([name]) 07/18/2023   Sepsis ([name]) 07/17/2023   Iron deficiency anemia 07/17/2023   Dementia without behavioral disturbance ([name]) 07/17/2023   Reactive airway disease 07/17/2023   History of dementia 07/17/2023   Malnutrition of moderate degree 04/23/2023   AMS (altered mental status) 04/19/2023   Dry gangrene ([name]) 04/19/2023   Cystitis 04/18/2023   AVM (arteriovenous malformation) of small bowel, acquired with hemorrhage 12/06/2022   Hiatal hernia 12/06/2022   Lower esophageal ring (Schatzki) 12/06/2022   Acute on chronic anemia 12/05/2022   Heme positive stool 12/05/2022   Long term (current) use of antithrombotics/antiplatelets 12/05/2022   GI bleed 12/04/2022   Toxic metabolic encephalopathy 11/07/2022   Acute metabolic encephalopathy 11/06/2022   UTI (urinary tract infection) 11/06/2022   Wound of lower extremity 11/06/2022   GERD without esophagitis 01/26/2022   Osteomyelitis of second toe of right foot ([name]) 01/25/2022   Acute kidney injury superimposed on chronic kidney disease ([name])    Altered mental status    PAD (peripheral artery disease) ([name]) 12/09/2021   Balanitis 12/09/2021   Pressure injury of skin 11/25/2021   Diabetic foot ([name]) 11/16/2021   Diabetic foot infection ([name]) 11/15/2021   Intertrigo  11/15/2021   History of CVA (cerebrovascular accident) 05/21/2021   Chronic kidney disease, stage 3b ([name]) 05/21/2021   Mixed diabetic hyperlipidemia associated with type 2 diabetes mellitus ([name]) 05/21/2021   Polyneuropathy due to type 2 diabetes mellitus ([name]) 05/21/2021   Leukocytosis 05/21/2021   Non-insulin dependent type 2 diabetes mellitus ([name]) 09/29/2015   Arthritis 09/06/2015   Diabetic peripheral neuropathy ([name]) 08/17/2015   OA (osteoarthritis) of knee 08/17/2015   Primary osteoarthritis of both knees 08/11/2015   Primary osteoarthritis of left hip  08/11/2015   Hammer toe, acquired 09/30/2013   History of colonic polyps 04/15/2013   Chronic pancreatitis ([name]) 02/26/2013   Anemia of chronic disease 02/26/2013   Goals of care, counseling/discussion 02/26/2013   Essential hypertension 12/14/2012   Pancreatic cyst 06/25/2012   Testosterone deficiency 10/22/2011   Headache above the eye region 02/22/2011   Coronary artery disease 02/22/2011   PCP:  Sharlet Salina, MD Pharmacy:   [facility] - [place], [place] - [address] [place] [address] Phone: [phone] Fax: [phone]     Social Drivers of Health (SDOH) Social History: SDOH Screenings   Food Insecurity: Patient Unable To Answer (07/18/2023)  Housing: Patient Unable To Answer (07/18/2023)  Transportation Needs: Patient Unable To Answer (07/18/2023)  Utilities: Patient Unable To Answer (07/18/2023)  Depression (PHQ2-9): Low Risk  (02/13/2022)  Social Connections: Patient Unable To Answer (07/18/2023)  Tobacco Use: Medium Risk (07/17/2023)   SDOH Interventions:     Readmission Risk Interventions    04/23/2023    2:00 PM 01/26/2022   11:24 AM  Readmission Risk Prevention Plan  Transportation Screening Complete Complete  PCP or Specialist Appt within 3-5 Days  Complete  HRI or Home Care Consult  Complete  Social Work Consult for Recovery Care Planning/Counseling  Complete  Palliative Care Screening  Not Applicable  Medication Review Oceanographer) Complete Complete  SW Recovery Care/Counseling Consult Complete   Palliative Care Screening Complete   Skilled Nursing Facility Complete

## 2023-07-19 NOTE — Progress Notes (Signed)
 ABI has been completed.   Results can be found under chart review under CV PROC. 07/19/2023 1:32 PM Litzy Dicker RVT, RDMS

## 2023-07-19 NOTE — Progress Notes (Signed)
 PODIATRY PROGRESS NOTE Patient Name: Justin [name]  DOB [birth year] DOA 07/17/2023  Hospital Day: 3  Assessment:  89 y.o. male with PMHx significant for DM2 with PVD and neuropathy HTN HLD CKD history of CVA dementia with left hallux osteomyelitis, dehiscence at third toe amputation site and gangrenous changes .   WBC: 13.4  Imaging: MRI left foot  1. Interval amputation of the third toe phalanges. 2. High-grade motion artifact. On the sequence with the best preserved uniformity of fat suppression, the coronal STIR image, there is again increased signal within the distal aspect of the distal phalanx of the great toe with again a soft tissue ulcer within the adjacent distal great toe, again highly suggestive of active osteomyelitis. 3. Moderate fluid around the second and third flexor tendons at the level of the proximal through distal aspect of the metatarsal shaft of the third digit and the mid to distal metatarsal shaft of the second digit. This tenosynovitis is increased from prior 04/20/2023 MRI.  Plan:  -  Pt would require left foot trans metatarsal amputation at this time if surgery is considered. Dr. Annamary Rummage has previously discussed the surgery with family, previously had stated prior to this admission desire for no more surgery.  In my discussion with patient's family and speaking with Justin [name] today, 07/19/2023 there weighing their options further. If  no surgery I recommend palliative care as the Left foot is causing pt to become septic, prognosis poor without source control.  - Abx per primary/ID  - Anticoagulation: ok to continue per primary - Wound care: Appreciate WOCN recs - WB status: WBAT -Did review ABIs showing decreased pedal pressures from prior study in February.  If the family is deciding to proceed with surgery, would benefit from arterial ultrasound study which could show potentially hemodynamic stenosis. - Will continue to follow         Barbaraann Share, DPM [facility]    Subjective:  Patient seen resting at bedside. Family present. They report he has not had appetite lately. Patient reporting pain to the left foot when pressure is applied.  Objective:   Vitals:   07/19/23 0412 07/19/23 0734  BP: (!) 130/56 (!) 148/67  Pulse: 94 99  Resp: 19 18  Temp: 97.8 F (36.6 C) 98.3 F (36.8 C)  SpO2: 100% 100%       Latest Ref Rng & Units 07/19/2023    6:32 AM 07/18/2023    5:03 AM 07/17/2023    2:20 PM  CBC  WBC 4.0 - 10.5 K/uL 13.4  15.8  20.7   Hemoglobin 13.0 - 17.0 g/dL 9.2  78.2  95.6   Hematocrit 39.0 - 52.0 % 28.1  32.3  40.2   Platelets 150 - 400 K/uL 311  332  408        Latest Ref Rng & Units 07/19/2023    6:32 AM 07/18/2023    5:03 AM 07/17/2023    2:20 PM  BMP  Glucose 70 - 99 mg/dL 213  086  578   BUN 8 - 23 mg/dL 39  53  50   Creatinine 0.61 - 1.24 mg/dL 4.69  6.29  5.28   Sodium 135 - 145 mmol/L 138  135  135   Potassium 3.5 - 5.1 mmol/L 3.8  4.5  5.0   Chloride 98 - 111 mmol/L 110  105  103   CO2 22 - 32 mmol/L 20  16  15  Calcium 8.9 - 10.3 mg/dL 8.2  8.2  8.6     General: AAOx3, NAD  Lower Extremity Exam Vasc:     .                L - PT non palpable, DP 1/4 palpable. Cap refill absent to hallux   Derm:    R -TMA without open wound               L -necrosis of the distal tuft of the left hallux.  Eschar in place.  No active drainage.  Mild erythema and edema of the toe. Wound at prior 3rd toe amputation site. Gangrenes changes to dorsal left 2nd toe      MSK:     R - prior TMA appears well-healed               L -  Prior 3rd toe amp   Neuro:R - Gross sensation diminished. Gross motor function intact                 L - Gross sensation diminished. Gross motor function intact   Radiology:  Results reviewed. See assessment for pertinent imaging results

## 2023-07-19 NOTE — Progress Notes (Signed)
 PROGRESS NOTE    Justin [name]  UXL:[id number] DOB: 12-31-1934 DOA: 07/17/2023 PCP: Sharlet Salina, MD    Brief Narrative:   89 year old with history of CKD 3B, chronic pancreatitis, HTN, DM2, HLD, chronic iron deficiency anemia, adenocarcinoma, CVA with right-sided hemiparesis, TIA, vascular/Alzheimer dementia, left TMA, left third digit amputation 06/19/2023 admitted for nausea and vomiting.  He is currently on doxycycline for left foot infection.  CT abdomen pelvis showing acute cystitis with concern for constipation/early sterile colitis.  Podiatry has been consulted.  MRI is consistent with osteomyelitis, podiatry to discuss further surgical options.  Assessment & Plan:  Principal Problem:   Sepsis ([name]) Active Problems:   Acute kidney injury superimposed on chronic kidney disease ([name])   Dry gangrene ([name])   Non-insulin dependent type 2 diabetes mellitus ([name])   Chronic pancreatitis ([name])   Essential hypertension   Anemia of chronic disease   History of CVA (cerebrovascular accident)   Iron deficiency anemia   Dementia without behavioral disturbance ([name])   Reactive airway disease   History of dementia   Metabolic acidosis   Sepsis Left-sided foot second digit dry gangrene with gangrene History of right metatarsal amputation Sepsis physiology improved.  MRI is consistent with osteomyelitis.  Podiatry following will discuss surgical option with the patient and the family - Continue cefepime and linezolid - Podiatry has been consulted     Acute kidney injury superimposed CKD stage IIIb, improving Urinary tract infection -Admission creatinine 2.57, baseline 1.7.  Continue IV fluids.  Creatinine 1.96 -Continue antibiotics for UTI   High anion metabolic acidosis - Likely from underlying renal issues.  Bicarb around 15, on sodium bicarb twice daily tablets   Chronic pancreatitis Nausea and vomiting - Supportive care.  Continue Creon  Constipation with possible early  steroid colitis - Aggressive bowel regimen, enema     Non-insulin-dependent DM type type II - Continue sliding scale SSI with mealtime coverage.  A1c 3 months ago 8.8, will repeat   Essential hypertension - Holding home medications.  IV as needed   History of CVA -Continue aspirin and Lipitor.   Iron deficiency anemia Iron supplements with bowel regimen   History of dementia without behavioral disturbance. - Depakote twice daily, Zoloft daily.  Atarax as needed    Reactive airway disease -Stable.  Continue Breo Ellipta and Combivent as needed  If patient and family declines further surgery, will require palliative consultation   DVT prophylaxis: SQ Hep    Code Status: Limited: Do not attempt resuscitation (DNR) -DNR-LIMITED -Do Not Intubate/DNI  Family Communication: Children at bedside Status is: Inpatient Remains inpatient appropriate because: Ongoing management for osteomyelitis    Subjective: Seen at bedside.  Son is also at bedside.  Patient is pleasantly confused but no complaints.  Currently awaiting their discussion with podiatry   Examination:  General exam: Appears calm and comfortable, cachectic frail Respiratory system: Clear to auscultation. Respiratory effort normal. Cardiovascular system: S1 & S2 heard, RRR. No JVD, murmurs, rubs, gallops or clicks. No pedal edema. Gastrointestinal system: Abdomen is nondistended, soft and nontender. No organomegaly or masses felt. Normal bowel sounds heard. Central nervous system: Alert and oriented to name only Extremities: Symmetric 4 x 5 power. Skin: No rashes, lesions or ulcers Psychiatry: Judgement and insight appear poor Third toe amputation of his left foot noted. Redness appearing left great toe               Diet Orders (From admission, onward)     Start  Ordered   07/17/23 2341  Diet heart healthy/carb modified Room service appropriate? Yes; Fluid consistency: Thin  Diet effective now        Question Answer Comment  Diet-HS Snack? Nothing   Room service appropriate? Yes   Fluid consistency: Thin      07/17/23 2340            Objective: Vitals:   07/18/23 0738 07/18/23 1958 07/19/23 0412 07/19/23 0734  BP: (!) 148/65 (!) 112/55 (!) 130/56 (!) 148/67  Pulse: (!) 107 76 94 99  Resp: 16 18 19 18   Temp: 98.5 F (36.9 C) 97.7 F (36.5 C) 97.8 F (36.6 C) 98.3 F (36.8 C)  TempSrc: Oral Oral Oral   SpO2: 100% 100% 100% 100%  Weight:      Height:        Intake/Output Summary (Last 24 hours) at 07/19/2023 1204 Last data filed at 07/19/2023 1000 Gross per 24 hour  Intake 601.33 ml  Output 1950 ml  Net -1348.67 ml   Filed Weights   07/17/23 1458  Weight: 68 kg    Scheduled Meds:  aspirin  81 mg Oral Daily   atorvastatin  40 mg Oral QHS   divalproex  125 mg Oral BID   ferrous gluconate  324 mg Oral Daily   fluticasone furoate-vilanterol  1 puff Inhalation Daily   gabapentin  100 mg Oral BID   heparin  5,000 Units Subcutaneous Q8H   insulin aspart  0-5 Units Subcutaneous QHS   insulin aspart  0-6 Units Subcutaneous TID WC   insulin glargine-yfgn  10 Units Subcutaneous Daily   linezolid  600 mg Oral Q12H   lipase/protease/amylase  24,000 Units Oral TID   pantoprazole  40 mg Oral Daily   polyethylene glycol  17 g Oral BID   senna-docusate  1 tablet Oral BID   sertraline  25 mg Oral Daily   sodium bicarbonate  650 mg Oral BID   sodium chloride flush  3 mL Intravenous Q12H   sodium chloride flush  3 mL Intravenous Q12H   Continuous Infusions:  ceFEPime (MAXIPIME) IV 2 g (07/18/23 2308)   magnesium sulfate bolus IVPB 4 g (07/19/23 1048)    Nutritional status     Body mass index is 19.26 kg/m.  Data Reviewed:   CBC: Recent Labs  Lab 07/17/23 1420 07/18/23 0503 07/19/23 0632  WBC 20.7* 15.8* 13.4*  NEUTROABS 17.3*  --   --   HGB 12.5* 10.4* 9.2*  HCT 40.2 32.3* 28.1*  MCV 99.3 95.3 94.3  PLT 408* 332 311   Basic Metabolic  Panel: Recent Labs  Lab 07/17/23 1420 07/18/23 0503 07/19/23 0632  NA 135 135 138  K 5.0 4.5 3.8  CL 103 105 110  CO2 15* 16* 20*  GLUCOSE 244* 249* 110*  BUN 50* 53* 39*  CREATININE 2.57* 2.47* 1.96*  CALCIUM 8.6* 8.2* 8.2*  MG  --   --  1.4*   GFR: Estimated Creatinine Clearance: 26 mL/min (A) (by C-G formula based on SCr of 1.96 mg/dL (H)). Liver Function Tests: Recent Labs  Lab 07/17/23 1420 07/18/23 0503  AST 31 26  ALT 29 24  ALKPHOS 80 77  BILITOT 0.8 0.8  PROT 8.9* 7.9  ALBUMIN 2.3* 2.1*   No results for input(s): "LIPASE", "AMYLASE" in the last 168 hours. No results for input(s): "AMMONIA" in the last 168 hours. Coagulation Profile: Recent Labs  Lab 07/17/23 2020  INR 1.6*   Cardiac Enzymes: No results  for input(s): "CKTOTAL", "CKMB", "CKMBINDEX", "TROPONINI" in the last 168 hours. BNP (last 3 results) No results for input(s): "PROBNP" in the last 8760 hours. HbA1C: Recent Labs    07/18/23 0503  HGBA1C 9.2*   CBG: Recent Labs  Lab 07/18/23 1221 07/18/23 1622 07/18/23 2000 07/19/23 0735 07/19/23 1138  GLUCAP 243* 152* 105* 90 241*   Lipid Profile: No results for input(s): "CHOL", "HDL", "LDLCALC", "TRIG", "CHOLHDL", "LDLDIRECT" in the last 72 hours. Thyroid Function Tests: No results for input(s): "TSH", "T4TOTAL", "FREET4", "T3FREE", "THYROIDAB" in the last 72 hours. Anemia Panel: No results for input(s): "VITAMINB12", "FOLATE", "FERRITIN", "TIBC", "IRON", "RETICCTPCT" in the last 72 hours. Sepsis Labs: Recent Labs  Lab 07/17/23 2027 07/18/23 0503  LATICACIDVEN 2.1* 1.7    Recent Results (from the past 240 hours)  Blood Culture (routine x 2)     Status: Abnormal (Preliminary result)   Collection Time: 07/17/23  8:20 PM   Specimen: BLOOD RIGHT FOREARM  Result Value Ref Range Status   Specimen Description BLOOD RIGHT FOREARM  Final   Special Requests   Final    BOTTLES DRAWN AEROBIC AND ANAEROBIC Blood Culture adequate volume    Culture  Setup Time   Final    GRAM POSITIVE COCCI IN CLUSTERS IN BOTH AEROBIC AND ANAEROBIC BOTTLES CRITICAL RESULT CALLED TO, READ BACK BY AND VERIFIED WITH: J [facility]  07/18/23 MK    Culture (A)  Final    STAPHYLOCOCCUS CAPITIS THE SIGNIFICANCE OF ISOLATING THIS ORGANISM FROM A SINGLE SET OF BLOOD CULTURES WHEN MULTIPLE SETS ARE DRAWN IS UNCERTAIN. PLEASE NOTIFY THE MICROBIOLOGY DEPARTMENT WITHIN ONE WEEK IF SPECIATION AND SENSITIVITIES ARE REQUIRED. Performed at [facility], [address], [place], [place] [address]    Report Status PENDING  Incomplete  Blood Culture ID Panel (Reflexed)     Status: Abnormal   Collection Time: 07/17/23  8:20 PM  Result Value Ref Range Status   Enterococcus faecalis NOT DETECTED NOT DETECTED Final   Enterococcus Faecium NOT DETECTED NOT DETECTED Final   Listeria monocytogenes NOT DETECTED NOT DETECTED Final   Staphylococcus species DETECTED (A) NOT DETECTED Final    Comment: CRITICAL RESULT CALLED TO, READ BACK BY AND VERIFIED WITH: J [facility]  07/18/23 MK    Staphylococcus aureus (BCID) NOT DETECTED NOT DETECTED Final   Staphylococcus epidermidis NOT DETECTED NOT DETECTED Final   Staphylococcus lugdunensis NOT DETECTED NOT DETECTED Final   Streptococcus species NOT DETECTED NOT DETECTED Final   Streptococcus agalactiae NOT DETECTED NOT DETECTED Final   Streptococcus pneumoniae NOT DETECTED NOT DETECTED Final   Streptococcus pyogenes NOT DETECTED NOT DETECTED Final   A.calcoaceticus-baumannii NOT DETECTED NOT DETECTED Final   Bacteroides fragilis NOT DETECTED NOT DETECTED Final   Enterobacterales NOT DETECTED NOT DETECTED Final   Enterobacter cloacae complex NOT DETECTED NOT DETECTED Final   Escherichia coli NOT DETECTED NOT DETECTED Final   Klebsiella aerogenes NOT DETECTED NOT DETECTED Final   Klebsiella oxytoca NOT DETECTED NOT DETECTED Final   Klebsiella pneumoniae NOT DETECTED NOT DETECTED Final   Proteus species  NOT DETECTED NOT DETECTED Final   Salmonella species NOT DETECTED NOT DETECTED Final   Serratia marcescens NOT DETECTED NOT DETECTED Final   Haemophilus influenzae NOT DETECTED NOT DETECTED Final   Neisseria meningitidis NOT DETECTED NOT DETECTED Final   Pseudomonas aeruginosa NOT DETECTED NOT DETECTED Final   Stenotrophomonas maltophilia NOT DETECTED NOT DETECTED Final   Candida albicans NOT DETECTED NOT DETECTED Final   Candida auris NOT DETECTED NOT  DETECTED Final   Candida glabrata NOT DETECTED NOT DETECTED Final   Candida krusei NOT DETECTED NOT DETECTED Final   Candida parapsilosis NOT DETECTED NOT DETECTED Final   Candida tropicalis NOT DETECTED NOT DETECTED Final   Cryptococcus neoformans/gattii NOT DETECTED NOT DETECTED Final    Comment: Performed at [facility], [address], [place], [place] [address]  Blood Culture (routine x 2)     Status: None (Preliminary result)   Collection Time: 07/17/23  8:55 PM   Specimen: BLOOD RIGHT FOREARM  Result Value Ref Range Status   Specimen Description BLOOD RIGHT FOREARM  Final   Special Requests   Final    BOTTLES DRAWN AEROBIC AND ANAEROBIC Blood Culture results may not be optimal due to an inadequate volume of blood received in culture bottles   Culture   Final    NO GROWTH 2 DAYS Performed at [facility], [address] [place], [place] [address]    Report Status PENDING  Incomplete  Resp panel by RT-PCR (RSV, Flu A&B, Covid) Anterior Nasal Swab     Status: None   Collection Time: 07/17/23 11:36 PM   Specimen: Anterior Nasal Swab  Result Value Ref Range Status   SARS Coronavirus 2 by RT PCR NEGATIVE NEGATIVE Final   Influenza A by PCR NEGATIVE NEGATIVE Final   Influenza B by PCR NEGATIVE NEGATIVE Final    Comment: (NOTE) The Xpert Xpress SARS-CoV-2/FLU/RSV plus assay is intended as an aid in the diagnosis of influenza from Nasopharyngeal swab specimens and should not be used as a sole basis for treatment. Nasal  washings and aspirates are unacceptable for Xpert Xpress SARS-CoV-2/FLU/RSV testing.  Fact Sheet for Patients: BloggerCourse.com  Fact Sheet for Healthcare Providers: SeriousBroker.it  This test is not yet approved or cleared by the [place] FDA and has been authorized for detection and/or diagnosis of SARS-CoV-2 by FDA under an Emergency Use Authorization (EUA). This EUA will remain in effect (meaning this test can be used) for the duration of the COVID-19 declaration under Section 564(b)(1) of the Act, 21 U.S.C. section 360bbb-3(b)(1), unless the authorization is terminated or revoked.     Resp Syncytial Virus by PCR NEGATIVE NEGATIVE Final    Comment: (NOTE) Fact Sheet for Patients: BloggerCourse.com  Fact Sheet for Healthcare Providers: SeriousBroker.it  This test is not yet approved or cleared by the [place] FDA and has been authorized for detection and/or diagnosis of SARS-CoV-2 by FDA under an Emergency Use Authorization (EUA). This EUA will remain in effect (meaning this test can be used) for the duration of the COVID-19 declaration under Section 564(b)(1) of the Act, 21 U.S.C. section 360bbb-3(b)(1), unless the authorization is terminated or revoked.  Performed at [facility], [address], [place], [place] [address]   Urine Culture     Status: Abnormal   Collection Time: 07/17/23 11:50 PM   Specimen: Urine, Random  Result Value Ref Range Status   Specimen Description URINE, RANDOM  Final   Special Requests   Final    NONE Reflexed from [id number] Performed at [facility], [address] [place], [place] [address]    Culture MULTIPLE SPECIES PRESENT, SUGGEST RECOLLECTION (A)  Final   Report Status 07/18/2023 FINAL  Final         Radiology Studies: MR FOOT LEFT WO CONTRAST Result Date: 07/18/2023 CLINICAL DATA:  Soft tissue  infection suspected. EXAM: MRI OF THE LEFT FOOT WITHOUT CONTRAST TECHNIQUE: Multiplanar, multisequence  MR imaging of the left forefoot was performed. No intravenous contrast was administered. The technologist reports the patient had altered mental status and kept moving his foot out of the coil. The best possible images were performed. COMPARISON:  Left foot radiographs 06/26/2023, 04/19/2023; MRI left forefoot 04/20/2023 FINDINGS: Despite efforts by the technologist and patient, moderate to high-grade motion artifact is present on today's exam and could not be eliminated. This reduces exam sensitivity and specificity. Bones/Joint/Cartilage Interval amputation of the third toe phalanges compared to 04/20/2023 MRI, as also seen on 06/26/2023 radiographs. No sagittal STIR image was performed, likely due to patient motion patient condition described by the technologist. On the sequence with the greatest uniformity of fat suppression, the coronal STIR series 10, there is again increased signal within the distal aspect of the distal phalanx of the great toe (coronal series 10, image 22) with again a soft tissue defect within the adjacent distal great toe, again highly suggestive of active osteomyelitis. No definite osteomyelitis is seen within the second, fourth, or fifth toe phalanges. Moderate great toe metatarsophalangeal osteoarthritis including cartilage thinning, subchondral marrow edema, and peripheral osteophytosis. Ligaments The Lisfranc ligament complex is grossly intact. Muscles and Tendons There is moderate fluid around the second and third flexor tendons at the level of the proximal through distal aspect of the metatarsal shaft of the third digit and the mid to distal metatarsal shaft of the second digit (axial series 11 images 4 through 16 and coronal series 10 images 27 and 28). This is increased from prior 04/20/2023 MRI. Soft tissues There is mild subcutaneous fat edema and swelling greatest within the  plantar medial forefoot, plantar to the distal first metatarsal and plantar medial aspect of the first toe phalanges. IMPRESSION: Compared to 04/20/2023 MRI: 1. Interval amputation of the third toe phalanges. 2. High-grade motion artifact. On the sequence with the best preserved uniformity of fat suppression, the coronal STIR image, there is again increased signal within the distal aspect of the distal phalanx of the great toe with again a soft tissue ulcer within the adjacent distal great toe, again highly suggestive of active osteomyelitis. 3. Moderate fluid around the second and third flexor tendons at the level of the proximal through distal aspect of the metatarsal shaft of the third digit and the mid to distal metatarsal shaft of the second digit. This tenosynovitis is increased from prior 04/20/2023 MRI. Electronically Signed   By: Neita Garnet M.D.   On: 07/18/2023 10:00   CT ABDOMEN PELVIS WO CONTRAST Result Date: 07/17/2023 CLINICAL DATA:  Abdominal pain, acute, nonlocalized, nausea and vomiting EXAM: CT ABDOMEN AND PELVIS WITHOUT CONTRAST TECHNIQUE: Multidetector CT imaging of the abdomen and pelvis was performed following the standard protocol without IV contrast. Of note, the lack of intravenous contrast limits evaluation of the solid organ parenchyma and vascularity. RADIATION DOSE REDUCTION: This exam was performed according to the departmental dose-optimization program which includes automated exposure control, adjustment of the mA and/or kV according to patient size and/or use of iterative reconstruction technique. COMPARISON:  December 05, 2022, January 03, 2013 FINDINGS: Lower chest: No focal airspace consolidation or pleural effusion.Fibro linear scarring and subsegmental atelectasis in the lung bases. Dense multi-vessel coronary atherosclerosis. Hepatobiliary: Similar clustered chunky parenchymal calcification centered primarily within segment 7 of the liver, which given the stability, this  is benign.Cholecystectomy.No intrahepatic or extrahepatic biliary ductal dilation. Pancreas: atrophy of the pancreatic parenchyma. No mass or ductal dilation.No peripancreatic inflammation or fluid collection. Spleen: Normal size. No mass.  Adrenals/Urinary Tract: Unchanged nodularity of the left adrenal gland without discrete mass or dominant nodule. No renal mass. Extensive renal hilar vascular calcification. No hydronephrosis or nephrolithiasis. Decompressed urinary bladder with perivesicular stranding. Stomach/Bowel: Moderate amount of ingested material in the stomach. 1.5 cm radiopaque structure along the gastric antrum (axial 19). No small bowel wall thickening or inflammation. No small bowel obstruction. Normal appendix. Moderate amount of fecal material throughout the proximal colon. There is a moderate column of formed stool in the rectosigmoid colon measuring approximately 8.5 by 8.4 cm in the rectum. Mild wall thickening at the rectosigmoid colon junction. Vascular/Lymphatic: No aortic aneurysm. Diffuse aortoiliac atherosclerosis. No intraabdominal or pelvic lymphadenopathy. Reproductive: No prostatomegaly.No free pelvic fluid. Other: No pneumoperitoneum, ascites, or mesenteric inflammation. Musculoskeletal: No acute fracture or destructive lesion.Diffuse osteopenia. Chronic compression fractures of L1 and L3, unchanged. Multilevel degenerative disc disease of the spine. Moderate to severe bilateral hip osteoarthritis. IMPRESSION: 1. Findings worrisome for acute cystitis. Correlation with urinalysis recommended. 2. Moderate amount of fecal material throughout the colon, as can be seen in constipation. A significant column of formed stool noted in the rectosigmoid colon, measuring 8.5 x 8.4 cm in the rectum with mild wall thickening of the rectosigmoid junction, as can be seen in early stercoral colitis. 3. Along the gastric antrum (axial 19), there is a 1.5 cm linear radiopaque structure, which may reflect  an endoscopic clip or ingested foreign body. Clinical correlation requested. Electronically Signed   By: Wallie Char M.D.   On: 07/17/2023 21:40   DG Chest Port 1 View Result Date: 07/17/2023 CLINICAL DATA:  Questionable sepsis - evaluate for abnormality. Nausea and vomiting EXAM: PORTABLE CHEST 1 VIEW COMPARISON:  Radiograph 01/26/2022. FINDINGS: The heart is normal in size. Bandlike atelectasis at the left lung base. No confluent airspace disease. No pulmonary edema, large pleural effusion or pneumothorax. IMPRESSION: Bandlike atelectasis at the left lung base. Electronically Signed   By: Narda Rutherford M.D.   On: 07/17/2023 18:55           LOS: 2 days   Time spent= 35 mins    Miguel Rota, MD [facility]  If 7PM-7AM, please contact night-coverage  07/19/2023, 12:04 PM

## 2023-07-19 NOTE — Care Management Important Message (Signed)
 Important Message  Patient Details  Name: Justin [name] MRN: [id number] Date of Birth: 12/04/1934   Important Message Given:  Yes - Medicare IM     Dorena Bodo 07/19/2023, 4:34 PM

## 2023-07-20 ENCOUNTER — Inpatient Hospital Stay (HOSPITAL_COMMUNITY)

## 2023-07-20 DIAGNOSIS — I7090 Unspecified atherosclerosis: Secondary | ICD-10-CM | POA: Diagnosis not present

## 2023-07-20 DIAGNOSIS — N179 Acute kidney failure, unspecified: Secondary | ICD-10-CM | POA: Diagnosis not present

## 2023-07-20 DIAGNOSIS — I96 Gangrene, not elsewhere classified: Secondary | ICD-10-CM | POA: Diagnosis not present

## 2023-07-20 DIAGNOSIS — Z8673 Personal history of transient ischemic attack (TIA), and cerebral infarction without residual deficits: Secondary | ICD-10-CM | POA: Diagnosis not present

## 2023-07-20 DIAGNOSIS — K861 Other chronic pancreatitis: Secondary | ICD-10-CM | POA: Diagnosis not present

## 2023-07-20 LAB — CBC
HCT: 28.6 % — ABNORMAL LOW (ref 39.0–52.0)
Hemoglobin: 9.3 g/dL — ABNORMAL LOW (ref 13.0–17.0)
MCH: 30.2 pg (ref 26.0–34.0)
MCHC: 32.5 g/dL (ref 30.0–36.0)
MCV: 92.9 fL (ref 80.0–100.0)
Platelets: 285 10*3/uL (ref 150–400)
RBC: 3.08 MIL/uL — ABNORMAL LOW (ref 4.22–5.81)
RDW: 13.7 % (ref 11.5–15.5)
WBC: 12.9 10*3/uL — ABNORMAL HIGH (ref 4.0–10.5)
nRBC: 0 % (ref 0.0–0.2)

## 2023-07-20 LAB — GLUCOSE, CAPILLARY
Glucose-Capillary: 83 mg/dL (ref 70–99)
Glucose-Capillary: 89 mg/dL (ref 70–99)
Glucose-Capillary: 170 mg/dL — ABNORMAL HIGH (ref 70–99)
Glucose-Capillary: 206 mg/dL — ABNORMAL HIGH (ref 70–99)
Glucose-Capillary: 176 mg/dL — ABNORMAL HIGH (ref 70–99)

## 2023-07-20 LAB — BASIC METABOLIC PANEL WITH GFR
Anion gap: 7 (ref 5–15)
BUN: 27 mg/dL — ABNORMAL HIGH (ref 8–23)
CO2: 23 mmol/L (ref 22–32)
Calcium: 7.9 mg/dL — ABNORMAL LOW (ref 8.9–10.3)
Chloride: 107 mmol/L (ref 98–111)
Creatinine, Ser: 1.52 mg/dL — ABNORMAL HIGH (ref 0.61–1.24)
GFR, Estimated: 44 mL/min — ABNORMAL LOW (ref >60)
Glucose, Bld: 94 mg/dL (ref 70–99)
Potassium: 3.9 mmol/L (ref 3.5–5.1)
Sodium: 137 mmol/L (ref 135–145)

## 2023-07-20 LAB — MAGNESIUM: Magnesium: 2.2 mg/dL (ref 1.7–2.4)

## 2023-07-20 MED ORDER — FLUTICASONE FUROATE-VILANTEROL 100-25 MCG/ACT IN AEPB
1.0000 | INHALATION_SPRAY | Freq: Every day | RESPIRATORY_TRACT | Status: DC
  Administered 2023-07-20 – 2023-07-22 (×3): 1 via RESPIRATORY_TRACT
  Filled 2023-07-20: qty 28

## 2023-07-20 MED ORDER — SODIUM CHLORIDE 0.9 % IV SOLN
2.0000 g | Freq: Two times a day (BID) | INTRAVENOUS | Status: DC
  Administered 2023-07-20 – 2023-07-24 (×9): 2 g via INTRAVENOUS
  Filled 2023-07-20 (×11): qty 12.5

## 2023-07-20 NOTE — Progress Notes (Signed)
 [facility]  PROGRESS NOTE  Justin [name] ZOX:[id number] DOB: [birth year] DOA: 07/17/2023 PCP: Justin [name]   Brief HPI:   89 year old with history of CKD 3B, chronic pancreatitis, HTN, DM2, HLD, chronic iron deficiency anemia, adenocarcinoma, CVA with right-sided hemiparesis, TIA, vascular/Alzheimer dementia, left TMA, left third digit amputation 06/19/2023 admitted for nausea and vomiting. He is currently on doxycycline for left foot infection. CT abdomen pelvis showing acute cystitis with concern for constipation/early sterile colitis. Podiatry has been consulted. MRI is consistent with osteomyelitis, podiatry to discuss further surgical options.     Assessment/Plan:   Sepsis Left-sided foot second digit dry gangrene with gangrene History of right metatarsal amputation Sepsis physiology has resolved MRI is consistent with osteomyelitis.  Podiatry following  - Family is ready for surgery.  Called and discussed with podiatrist Dr. Marvis [name] - He will talk to family today, likely surgery in a.m. - Continue cefepime and linezolid   Acute kidney injury superimposed CKD stage IIIb, improving -Admission creatinine 2.57, baseline 1.7.  Continue IV fluids.  Creatinine 1.96 - Urine culture grew multiple species   High anion metabolic acidosis - Likely from underlying renal issues.  Bicarb around 15, on sodium bicarb twice daily tablets   Chronic pancreatitis Nausea and vomiting - Supportive care.  Continue Creon   Constipation with possible early steroid colitis - Aggressive bowel regimen, enema     Non-insulin-dependent DM type type II - Continue sliding scale SSI with mealtime coverage.  A1c 3 months ago 8.8 - Repeat hemoglobin is 9.2   Essential hypertension - Holding home medications.  IV as needed   History of CVA -Continue aspirin and Lipitor.   Iron deficiency anemia Iron supplements with bowel regimen   History of dementia without behavioral disturbance. -  Depakote twice daily, Zoloft daily.  Atarax as needed     Reactive airway disease -Stable.  Continue Breo Ellipta and Combivent as needed       Medications     aspirin  81 mg Oral Daily   atorvastatin  40 mg Oral QHS   divalproex  125 mg Oral BID   ferrous gluconate  324 mg Oral Daily   fluticasone furoate-vilanterol  1 puff Inhalation Daily   gabapentin  100 mg Oral BID   heparin  5,000 Units Subcutaneous Q8H   insulin aspart  0-5 Units Subcutaneous QHS   insulin aspart  0-6 Units Subcutaneous TID WC   insulin glargine-yfgn  10 Units Subcutaneous Daily   linezolid  600 mg Oral Q12H   lipase/protease/amylase  24,000 Units Oral TID   pantoprazole  40 mg Oral Daily   polyethylene glycol  17 g Oral BID   senna-docusate  1 tablet Oral BID   sertraline  25 mg Oral Daily   sodium bicarbonate  650 mg Oral BID   sodium chloride flush  3 mL Intravenous Q12H   sodium chloride flush  3 mL Intravenous Q12H     Data Reviewed:   CBG:  Recent Labs  Lab 07/19/23 2014 07/19/23 2103 07/20/23 0548 07/20/23 0727 07/20/23 1121  GLUCAP 61* 81 83 89 170*    SpO2: 99 %    Vitals:   07/19/23 1618 07/19/23 2012 07/20/23 0546 07/20/23 0832  BP: 133/61 (!) 122/53 139/64   Pulse: 91 73 89   Resp: 18 18 18    Temp: 98.3 F (36.8 C) 98.2 F (36.8 C) 98 F (36.7 C)   TempSrc:      SpO2: 98% 97% 99% 99%  Weight:      Height:          Data Reviewed:  Basic Metabolic Panel: Recent Labs  Lab 07/17/23 1420 07/18/23 0503 07/19/23 0632 07/20/23 0713  NA 135 135 138 137  K 5.0 4.5 3.8 3.9  CL 103 105 110 107  CO2 15* 16* 20* 23  GLUCOSE 244* 249* 110* 94  BUN 50* 53* 39* 27*  CREATININE 2.57* 2.47* 1.96* 1.52*  CALCIUM 8.6* 8.2* 8.2* 7.9*  MG  --   --  1.4* 2.2    CBC: Recent Labs  Lab 07/17/23 1420 07/18/23 0503 07/19/23 0632 07/20/23 0713  WBC 20.7* 15.8* 13.4* 12.9*  NEUTROABS 17.3*  --   --   --   HGB 12.5* 10.4* 9.2* 9.3*  HCT 40.2 32.3* 28.1* 28.6*  MCV  99.3 95.3 94.3 92.9  PLT 408* 332 311 285    LFT Recent Labs  Lab 07/17/23 1420 07/18/23 0503  AST 31 26  ALT 29 24  ALKPHOS 80 77  BILITOT 0.8 0.8  PROT 8.9* 7.9  ALBUMIN 2.3* 2.1*     Antibiotics: Anti-infectives (From admission, onward)    Start     Dose/Rate Route Frequency Ordered Stop   07/20/23 0930  ceFEPIme (MAXIPIME) 2 g in sodium chloride 0.9 % 100 mL IVPB        2 g 200 mL/hr over 30 Minutes Intravenous Every 12 hours 07/20/23 0921     07/18/23 2100  ceFEPIme (MAXIPIME) 2 g in sodium chloride 0.9 % 100 mL IVPB  Status:  Discontinued        2 g 200 mL/hr over 30 Minutes Intravenous Every 24 hours 07/17/23 2349 07/20/23 0921   07/18/23 1000  linezolid (ZYVOX) tablet 600 mg        600 mg Oral Every 12 hours 07/18/23 0804     07/17/23 2349  vancomycin variable dose per unstable renal function (pharmacist dosing)  Status:  Discontinued         Does not apply See admin instructions 07/17/23 2349 07/18/23 0804   07/17/23 1645  ceFEPIme (MAXIPIME) 2 g in sodium chloride 0.9 % 100 mL IVPB        2 g 200 mL/hr over 30 Minutes Intravenous  Once 07/17/23 1633 07/17/23 2143   07/17/23 1645  metroNIDAZOLE (FLAGYL) IVPB 500 mg        500 mg 100 mL/hr over 60 Minutes Intravenous  Once 07/17/23 1633 07/17/23 2304   07/17/23 1645  vancomycin (VANCOCIN) IVPB 1000 mg/200 mL premix        1,000 mg 200 mL/hr over 60 Minutes Intravenous  Once 07/17/23 1633 07/17/23 2305        DVT prophylaxis: Heparin  Code Status: DNR  Family Communication: Discussed with patient's son on phone   CONSULTS podiatry   Subjective   Denies any complaints   Objective    Physical Examination:   General-appears in no acute distress Heart-S1-S2, regular, no murmur auscultated Lungs-clear to auscultation bilaterally, no wheezing or crackles auscultated Abdomen-soft, nontender, no organomegaly Extremities-no edema in the lower extremities, both feet in dressing, status post right  transtibial amputation Neuro-alert, oriented x3, no focal deficit noted  Status is: Inpatient:             Justin [name]   [facility] If 7PM-7AM, please contact night-coverage at [email or web address], Office  [phone]   07/20/2023, 12:15 PM  LOS: 3 days

## 2023-07-20 NOTE — Progress Notes (Signed)
 PHARMACY NOTE:  ANTIMICROBIAL RENAL DOSAGE ADJUSTMENT  Current antimicrobial regimen includes a mismatch between antimicrobial dosage and estimated renal function.  As per policy approved by the Pharmacy & Therapeutics and Medical Executive Committees, the antimicrobial dosage will be adjusted accordingly.  Current antimicrobial dosage: Cefepime 2 g IV q24h  Indication: Lower extremity wound infection with osteomyelitis  Renal Function: Estimated Creatinine Clearance: 33.6 mL/min (A) (by C-G formula based on SCr of 1.52 mg/dL (H)).  Antimicrobial dosage has been changed to:  Cefepime 2g IV q12h  Additional comments:  Thank you for allowing pharmacy to be a part of this patient's care.  Saunders Curio, [facility] 07/20/2023 9:22 AM

## 2023-07-20 NOTE — Progress Notes (Signed)
 VASCULAR LAB    Left lower extremity arterial duplex has been performed.  See CV proc for preliminary results.   Kerney Hopfensperger, RVT 07/20/2023, 6:26 PM

## 2023-07-20 NOTE — Progress Notes (Signed)
 PODIATRY PROGRESS NOTE Patient Name: Justin [name]  DOB [birth year] DOA 07/17/2023  Hospital Day: 4  Assessment:  89 y.o. male with PMHx significant for DM2 with PVD and neuropathy HTN HLD CKD history of CVA dementia with left hallux osteomyelitis, dehiscence at third toe amputation site and gangrenous changes .   WBC: 13.4  Imaging: MRI left foot  1. Interval amputation of the third toe phalanges. 2. High-grade motion artifact. On the sequence with the best preserved uniformity of fat suppression, the coronal STIR image, there is again increased signal within the distal aspect of the distal phalanx of the great toe with again a soft tissue ulcer within the adjacent distal great toe, again highly suggestive of active osteomyelitis. 3. Moderate fluid around the second and third flexor tendons at the level of the proximal through distal aspect of the metatarsal shaft of the third digit and the mid to distal metatarsal shaft of the second digit. This tenosynovitis is increased from prior 04/20/2023 MRI.  Plan:  -  Lengthy discussion with family again today regarding operative vs non-operative treatment. Whereas TMA would provide source control, there is possibility that surgical site may not heal due to PAD, nutrition status with decreased PO intake, patient's overall general health. They have previously stated that they did not want more foot surgery. The continue to consider this. Did discuss palliative care and long term antibiotics and that this may not be curative either. They indicate they would like to proceed with surgery at time of my evaluation today. However later this afternoon RN contacting me stating family is declining surgery at this point.  - Abx per primary/ID  - Anticoagulation: ok to continue per primary - Wound care: Appreciate WOCN recs - WB status: WBAT - Podiatry will sign off at this point if decision is final regarding no surgery.         Reina Cara,  DPM [facility]    Subjective:  Patient seen resting at bedside. Family present. They report he has not had appetite lately.   Objective:   Vitals:   07/20/23 0832 07/20/23 1722  BP:  124/62  Pulse:  72  Resp:  16  Temp:  97.6 F (36.4 C)  SpO2: 99% 98%       Latest Ref Rng & Units 07/20/2023    7:13 AM 07/19/2023    6:32 AM 07/18/2023    5:03 AM  CBC  WBC 4.0 - 10.5 K/uL 12.9  13.4  15.8   Hemoglobin 13.0 - 17.0 g/dL 9.3  9.2  16.1   Hematocrit 39.0 - 52.0 % 28.6  28.1  32.3   Platelets 150 - 400 K/uL 285  311  332        Latest Ref Rng & Units 07/20/2023    7:13 AM 07/19/2023    6:32 AM 07/18/2023    5:03 AM  BMP  Glucose 70 - 99 mg/dL 94  096  045   BUN 8 - 23 mg/dL 27  39  53   Creatinine 0.61 - 1.24 mg/dL 4.09  8.11  9.14   Sodium 135 - 145 mmol/L 137  138  135   Potassium 3.5 - 5.1 mmol/L 3.9  3.8  4.5   Chloride 98 - 111 mmol/L 107  110  105   CO2 22 - 32 mmol/L 23  20  16    Calcium 8.9 - 10.3 mg/dL 7.9  8.2  8.2     General:  AAOx3, NAD  Lower Extremity Exam Vasc:     .                L - PT non palpable, DP 1/4 palpable. Cap refill absent to hallux   Derm:    R -TMA without open wound               L -necrosis of the distal tuft of the left hallux.  Eschar in place.  No active drainage.  Mild erythema and edema of the toe. Wound at prior 3rd toe amputation site. Gangrenous changes to dorsal left 2nd toe      MSK:     R - prior TMA appears well-healed               L -  Prior 3rd toe amp   Neuro:R - Gross sensation diminished. Gross motor function intact                 L - Gross sensation diminished. Gross motor function intact   Radiology:  Results reviewed. See assessment for pertinent imaging results

## 2023-07-21 ENCOUNTER — Encounter (HOSPITAL_COMMUNITY)
Admission: EM | Disposition: A | Discharge status: Hospice | Payer: Self-pay | Source: Skilled Nursing Facility | Attending: Family Medicine

## 2023-07-21 DIAGNOSIS — N189 Chronic kidney disease, unspecified: Secondary | ICD-10-CM | POA: Diagnosis not present

## 2023-07-21 DIAGNOSIS — I96 Gangrene, not elsewhere classified: Secondary | ICD-10-CM | POA: Diagnosis not present

## 2023-07-21 DIAGNOSIS — N179 Acute kidney failure, unspecified: Secondary | ICD-10-CM | POA: Diagnosis not present

## 2023-07-21 DIAGNOSIS — K861 Other chronic pancreatitis: Secondary | ICD-10-CM | POA: Diagnosis not present

## 2023-07-21 LAB — BASIC METABOLIC PANEL WITH GFR
Anion gap: 9 (ref 5–15)
BUN: 23 mg/dL (ref 8–23)
CO2: 22 mmol/L (ref 22–32)
Calcium: 8.3 mg/dL — ABNORMAL LOW (ref 8.9–10.3)
Chloride: 104 mmol/L (ref 98–111)
Creatinine, Ser: 1.41 mg/dL — ABNORMAL HIGH (ref 0.61–1.24)
GFR, Estimated: 49 mL/min — ABNORMAL LOW (ref >60)
Glucose, Bld: 147 mg/dL — ABNORMAL HIGH (ref 70–99)
Potassium: 4.1 mmol/L (ref 3.5–5.1)
Sodium: 135 mmol/L (ref 135–145)

## 2023-07-21 LAB — CBC
HCT: 30.8 % — ABNORMAL LOW (ref 39.0–52.0)
Hemoglobin: 10.1 g/dL — ABNORMAL LOW (ref 13.0–17.0)
MCH: 30.6 pg (ref 26.0–34.0)
MCHC: 32.8 g/dL (ref 30.0–36.0)
MCV: 93.3 fL (ref 80.0–100.0)
Platelets: 229 10*3/uL (ref 150–400)
RBC: 3.3 MIL/uL — ABNORMAL LOW (ref 4.22–5.81)
RDW: 13.6 % (ref 11.5–15.5)
WBC: 13.6 10*3/uL — ABNORMAL HIGH (ref 4.0–10.5)
nRBC: 0 % (ref 0.0–0.2)

## 2023-07-21 LAB — GLUCOSE, CAPILLARY
Glucose-Capillary: 129 mg/dL — ABNORMAL HIGH (ref 70–99)
Glucose-Capillary: 150 mg/dL — ABNORMAL HIGH (ref 70–99)
Glucose-Capillary: 186 mg/dL — ABNORMAL HIGH (ref 70–99)
Glucose-Capillary: 64 mg/dL — ABNORMAL LOW (ref 70–99)
Glucose-Capillary: 79 mg/dL (ref 70–99)

## 2023-07-21 LAB — CULTURE, BLOOD (ROUTINE X 2): Special Requests: ADEQUATE

## 2023-07-21 LAB — MAGNESIUM: Magnesium: 2 mg/dL (ref 1.7–2.4)

## 2023-07-21 SURGERY — AMPUTATION, FOOT, TRANSMETATARSAL
Anesthesia: Choice | Site: Toe | Laterality: Left

## 2023-07-21 NOTE — Plan of Care (Signed)

## 2023-07-21 NOTE — Progress Notes (Signed)
 [facility]  PROGRESS NOTE  Justin [name] ZOX:[id number] DOB: 02-01-1935 DOA: 07/17/2023 PCP: Sharlet Salina, MD   Brief HPI:   89 year old with history of CKD 3B, chronic pancreatitis, HTN, DM2, HLD, chronic iron deficiency anemia, adenocarcinoma, CVA with right-sided hemiparesis, TIA, vascular/Alzheimer dementia, left TMA, left third digit amputation 06/19/2023 admitted for nausea and vomiting. He is currently on doxycycline for left foot infection. CT abdomen pelvis showing acute cystitis with concern for constipation/early sterile colitis. Podiatry has been consulted. MRI is consistent with osteomyelitis, podiatry to discuss further surgical options.     Assessment/Plan:   Sepsis Left-sided foot second digit dry gangrene with gangrene History of right metatarsal amputation Sepsis physiology has resolved MRI is consistent with osteomyelitis.  Podiatry following  -Spoke to patient's son yesterday, he initially agreed for surgery -But when podiatrist, Dr. Melvenia Beam went to speak to family, they refused surgery - Continue cefepime and linezolid - Will consult palliative care, likely will need hospice   Acute kidney injury superimposed CKD stage IIIb, improving -Admission creatinine 2.57, baseline 1.7.  Continue IV fluids.  Creatinine 1.96 - Urine culture grew multiple species   High anion metabolic acidosis - Likely from underlying renal issues.  Bicarb around 15, on sodium bicarb twice daily tablets   Chronic pancreatitis Nausea and vomiting - Supportive care.  Continue Creon   Constipation with possible early steroid colitis - Aggressive bowel regimen, enema   Non-insulin-dependent DM type type II - Continue sliding scale SSI with mealtime coverage.  A1c 3 months ago 8.8 - Repeat hemoglobin is 9.2   Essential hypertension - Holding home medications.  IV as needed   History of CVA -Continue aspirin and Lipitor.   Iron deficiency anemia Iron supplements with bowel  regimen   History of dementia without behavioral disturbance. - Depakote twice daily, Zoloft daily.  Atarax as needed     Reactive airway disease -Stable.  Continue Breo Ellipta and Combivent as needed       Medications     aspirin  81 mg Oral Daily   atorvastatin  40 mg Oral QHS   divalproex  125 mg Oral BID   ferrous gluconate  324 mg Oral Daily   fluticasone furoate-vilanterol  1 puff Inhalation Daily   gabapentin  100 mg Oral BID   heparin  5,000 Units Subcutaneous Q8H   insulin aspart  0-5 Units Subcutaneous QHS   insulin aspart  0-6 Units Subcutaneous TID WC   insulin glargine-yfgn  10 Units Subcutaneous Daily   linezolid  600 mg Oral Q12H   lipase/protease/amylase  24,000 Units Oral TID   pantoprazole  40 mg Oral Daily   polyethylene glycol  17 g Oral BID   senna-docusate  1 tablet Oral BID   sertraline  25 mg Oral Daily   sodium bicarbonate  650 mg Oral BID   sodium chloride flush  3 mL Intravenous Q12H   sodium chloride flush  3 mL Intravenous Q12H     Data Reviewed:   CBG:  Recent Labs  Lab 07/20/23 1121 07/20/23 1633 07/20/23 2114 07/21/23 0746 07/21/23 1134  GLUCAP 170* 206* 176* 129* 150*    SpO2: 99 %    Vitals:   07/20/23 2111 07/21/23 0431 07/21/23 0828 07/21/23 0845  BP: (!) 149/66 (!) 148/78  (!) 150/67  Pulse: 73 71  70  Resp: 18 18    Temp: 98.3 F (36.8 C) 98 F (36.7 C)    TempSrc: Oral Oral    SpO2:  99% 99% 99% 99%  Weight:      Height:          Data Reviewed:  Basic Metabolic Panel: Recent Labs  Lab 07/17/23 1420 07/18/23 0503 07/19/23 0632 07/20/23 0713 07/21/23 0830  NA 135 135 138 137 135  K 5.0 4.5 3.8 3.9 4.1  CL 103 105 110 107 104  CO2 15* 16* 20* 23 22  GLUCOSE 244* 249* 110* 94 147*  BUN 50* 53* 39* 27* 23  CREATININE 2.57* 2.47* 1.96* 1.52* 1.41*  CALCIUM 8.6* 8.2* 8.2* 7.9* 8.3*  MG  --   --  1.4* 2.2 2.0    CBC: Recent Labs  Lab 07/17/23 1420 07/18/23 0503 07/19/23 0632 07/20/23 0713  07/21/23 0830  WBC 20.7* 15.8* 13.4* 12.9* 13.6*  NEUTROABS 17.3*  --   --   --   --   HGB 12.5* 10.4* 9.2* 9.3* 10.1*  HCT 40.2 32.3* 28.1* 28.6* 30.8*  MCV 99.3 95.3 94.3 92.9 93.3  PLT 408* 332 311 285 229    LFT Recent Labs  Lab 07/17/23 1420 07/18/23 0503  AST 31 26  ALT 29 24  ALKPHOS 80 77  BILITOT 0.8 0.8  PROT 8.9* 7.9  ALBUMIN 2.3* 2.1*     Antibiotics: Anti-infectives (From admission, onward)    Start     Dose/Rate Route Frequency Ordered Stop   07/20/23 0930  ceFEPIme (MAXIPIME) 2 g in sodium chloride 0.9 % 100 mL IVPB        2 g 200 mL/hr over 30 Minutes Intravenous Every 12 hours 07/20/23 0921     07/18/23 2100  ceFEPIme (MAXIPIME) 2 g in sodium chloride 0.9 % 100 mL IVPB  Status:  Discontinued        2 g 200 mL/hr over 30 Minutes Intravenous Every 24 hours 07/17/23 2349 07/20/23 0921   07/18/23 1000  linezolid (ZYVOX) tablet 600 mg        600 mg Oral Every 12 hours 07/18/23 0804     07/17/23 2349  vancomycin variable dose per unstable renal function (pharmacist dosing)  Status:  Discontinued         Does not apply See admin instructions 07/17/23 2349 07/18/23 0804   07/17/23 1645  ceFEPIme (MAXIPIME) 2 g in sodium chloride 0.9 % 100 mL IVPB        2 g 200 mL/hr over 30 Minutes Intravenous  Once 07/17/23 1633 07/17/23 2143   07/17/23 1645  metroNIDAZOLE (FLAGYL) IVPB 500 mg        500 mg 100 mL/hr over 60 Minutes Intravenous  Once 07/17/23 1633 07/17/23 2304   07/17/23 1645  vancomycin (VANCOCIN) IVPB 1000 mg/200 mL premix        1,000 mg 200 mL/hr over 60 Minutes Intravenous  Once 07/17/23 1633 07/17/23 2305        DVT prophylaxis: Heparin  Code Status: DNR  Family Communication: Discussed with patient's son on phone   CONSULTS podiatry   Subjective    Denies any complaints  Objective    Physical Examination:  General-appears in no acute distress Heart-S1-S2, regular, no murmur auscultated Lungs-clear to auscultation bilaterally,  no wheezing or crackles auscultated Abdomen-soft, nontender, no organomegaly Extremities-status post right transtibial amputation Neuro-alert, oriented x3, no focal deficit noted  Status is: Inpatient:             Justin [name]   [facility] If 7PM-7AM, please contact night-coverage at [email or web address], Office  [phone]   07/21/2023, 11:39 AM  LOS: 4 days

## 2023-07-22 DIAGNOSIS — K861 Other chronic pancreatitis: Secondary | ICD-10-CM | POA: Diagnosis not present

## 2023-07-22 DIAGNOSIS — I96 Gangrene, not elsewhere classified: Secondary | ICD-10-CM | POA: Diagnosis not present

## 2023-07-22 DIAGNOSIS — N179 Acute kidney failure, unspecified: Secondary | ICD-10-CM | POA: Diagnosis not present

## 2023-07-22 DIAGNOSIS — Z515 Encounter for palliative care: Secondary | ICD-10-CM

## 2023-07-22 DIAGNOSIS — Z7189 Other specified counseling: Secondary | ICD-10-CM

## 2023-07-22 DIAGNOSIS — N189 Chronic kidney disease, unspecified: Secondary | ICD-10-CM | POA: Diagnosis not present

## 2023-07-22 LAB — CBC
HCT: 31.7 % — ABNORMAL LOW (ref 39.0–52.0)
Hemoglobin: 10.5 g/dL — ABNORMAL LOW (ref 13.0–17.0)
MCH: 30.7 pg (ref 26.0–34.0)
MCHC: 33.1 g/dL (ref 30.0–36.0)
MCV: 92.7 fL (ref 80.0–100.0)
Platelets: 204 10*3/uL (ref 150–400)
RBC: 3.42 MIL/uL — ABNORMAL LOW (ref 4.22–5.81)
RDW: 13.6 % (ref 11.5–15.5)
WBC: 14.2 10*3/uL — ABNORMAL HIGH (ref 4.0–10.5)
nRBC: 0 % (ref 0.0–0.2)

## 2023-07-22 LAB — CULTURE, BLOOD (ROUTINE X 2): Culture: NO GROWTH

## 2023-07-22 LAB — BASIC METABOLIC PANEL WITH GFR
Anion gap: 10 (ref 5–15)
BUN: 19 mg/dL (ref 8–23)
CO2: 23 mmol/L (ref 22–32)
Calcium: 8.4 mg/dL — ABNORMAL LOW (ref 8.9–10.3)
Chloride: 103 mmol/L (ref 98–111)
Creatinine, Ser: 1.46 mg/dL — ABNORMAL HIGH (ref 0.61–1.24)
GFR, Estimated: 47 mL/min — ABNORMAL LOW (ref >60)
Glucose, Bld: 96 mg/dL (ref 70–99)
Potassium: 3.9 mmol/L (ref 3.5–5.1)
Sodium: 136 mmol/L (ref 135–145)

## 2023-07-22 LAB — GLUCOSE, CAPILLARY
Glucose-Capillary: 85 mg/dL (ref 70–99)
Glucose-Capillary: 88 mg/dL (ref 70–99)
Glucose-Capillary: 125 mg/dL — ABNORMAL HIGH (ref 70–99)
Glucose-Capillary: 85 mg/dL (ref 70–99)

## 2023-07-22 LAB — MAGNESIUM: Magnesium: 1.9 mg/dL (ref 1.7–2.4)

## 2023-07-22 NOTE — Inpatient Diabetes Management (Signed)
 Inpatient Diabetes Program Recommendations  AACE/ADA: New Consensus Statement on Inpatient Glycemic Control (2015)  Target Ranges:  Prepandial:   less than 140 mg/dL      Peak postprandial:   less than 180 mg/dL (1-2 hours)      Critically ill patients:  140 - 180 mg/dL   Lab Results  Component Value Date   GLUCAP 88 07/22/2023   HGBA1C 9.2 (H) 07/18/2023    Review of Glycemic Control  Latest Reference Range & Units 07/21/23 07:46 07/21/23 11:34 07/21/23 17:10 07/21/23 21:27 07/21/23 23:38 07/22/23 08:22 07/22/23 11:50  Glucose-Capillary 70 - 99 mg/dL 409 (H) 811 (H) 914 (H) 64 (L) 79 85 88   Diabetes history: DM 2 Outpatient Diabetes medications: Humalog 0-12 units tid, lantus 10 units qhs Current orders for Inpatient glycemic control:  Semglee 10 units daily Novolog 0-6 units tid + hs  Inpatient Diabetes Program Recommendations:    -   consider reducing Semglee to 8 units  Thanks,  Eloise Hake RN, MSN, BC-ADM Inpatient Diabetes Coordinator Team Pager [phone] (8a-5p)

## 2023-07-22 NOTE — TOC Progression Note (Signed)
 Transition of Care [facility]) - Progression Note    Patient Details  Name: Justin [name] MRN: [id number] Date of Birth: 1934-05-24  Transition of Care [facility]) CM/SW Contact  Jannice Mends, LCSW Phone Number: 07/22/2023, 3:52 PM  Clinical Narrative:    CSW continuing to follow for disposition needs.   Expected Discharge Plan: Skilled Nursing Facility Barriers to Discharge: Continued Medical Work up  Expected Discharge Plan and Services     Post Acute Care Choice: Skilled Nursing Facility Living arrangements for the past 2 months: Skilled Nursing Facility                                       Social Determinants of Health (SDOH) Interventions SDOH Screenings   Food Insecurity: Patient Unable To Answer (07/18/2023)  Housing: Patient Unable To Answer (07/18/2023)  Transportation Needs: Patient Unable To Answer (07/18/2023)  Utilities: Patient Unable To Answer (07/18/2023)  Depression (PHQ2-9): Low Risk  (02/13/2022)  Social Connections: Patient Unable To Answer (07/18/2023)  Tobacco Use: Medium Risk (07/17/2023)    Readmission Risk Interventions    04/23/2023    2:00 PM 01/26/2022   11:24 AM  Readmission Risk Prevention Plan  Transportation Screening Complete Complete  PCP or Specialist Appt within 3-5 Days  Complete  HRI or Home Care Consult  Complete  Social Work Consult for Recovery Care Planning/Counseling  Complete  Palliative Care Screening  Not Applicable  Medication Review Oceanographer) Complete Complete  SW Recovery Care/Counseling Consult Complete   Palliative Care Screening Complete   Skilled Nursing Facility Complete

## 2023-07-22 NOTE — Plan of Care (Signed)
   Palliative Medicine Inpatient Follow Up Note   The PMT has acknowledged the consultation for Justin [name].   I have called patients daughter, [name] to arrange a day and time for a family meeting - awaiting a call back.  Initial consult note to be posted after speaking with family.  No Charge ______________________________________________________________________________________ Camille Cedars [place] Palliative Medicine Team Team Cell Phone: [phone] Please utilize secure chat with additional questions, if there is no response within 30 minutes please call the above phone number  Palliative Medicine Team providers are available by phone from 7am to 7pm daily and can be reached through the team cell phone.  Should this patient require assistance outside of these hours, please call the patient's attending physician.

## 2023-07-22 NOTE — Progress Notes (Signed)
 [facility]  PROGRESS NOTE  Justin [name] OZH:[id number] DOB: June 02, 1934 DOA: 07/17/2023 PCP: Sharlet Salina, MD   Brief HPI:   89 year old with history of CKD 3B, chronic pancreatitis, HTN, DM2, HLD, chronic iron deficiency anemia, adenocarcinoma, CVA with right-sided hemiparesis, TIA, vascular/Alzheimer dementia, left TMA, left third digit amputation 06/19/2023 admitted for nausea and vomiting. He is currently on doxycycline for left foot infection. CT abdomen pelvis showing acute cystitis with concern for constipation/early sterile colitis. Podiatry has been consulted. MRI is consistent with osteomyelitis, podiatry to discuss further surgical options.     Assessment/Plan:   Sepsis Left-sided foot second digit dry gangrene with gangrene History of right metatarsal amputation Sepsis physiology has resolved MRI is consistent with osteomyelitis.  Podiatry following  -Spoke to patient's son yesterday, he initially agreed for surgery -But when podiatrist, Dr. Melvenia Beam went to speak to family, they refused surgery - Continue cefepime and linezolid - Perative care has been consulted , likely will need hospice - Incidentally patient was evaluated by vascular surgery for gangrene in January of this year at that time underwent angiogram, patient was optimized for surgery, no further intervention was recommended per vascular surgery.  Patient family refused surgery at that time. - They continue to refuse surgery   Acute kidney injury superimposed CKD stage IIIb, improving -?  UTI -Admission creatinine 2.57, baseline 1.7.  Continue IV fluids.  Creatinine 1.96 - Urine culture grew multiple species    High anion metabolic acidosis - Likely from underlying renal issues.  Bicarb around 15, on sodium bicarb twice daily tablets   Chronic pancreatitis Nausea and vomiting - Supportive care.  Continue Creon   Constipation with possible early steroid colitis - Aggressive bowel regimen, enema    Non-insulin-dependent DM type type II - Continue sliding scale SSI with mealtime coverage.  A1c 3 months ago 8.8 - Repeat hemoglobin A1c is 9.2   Essential hypertension - Holding home medications.  IV as needed   History of CVA -Continue aspirin and Lipitor.   Iron deficiency anemia Iron supplements with bowel regimen   History of dementia without behavioral disturbance. - Depakote twice daily, Zoloft daily.  Atarax as needed     Reactive airway disease -Stable.  Continue Breo Ellipta and Combivent as needed       Medications     aspirin  81 mg Oral Daily   atorvastatin  40 mg Oral QHS   divalproex  125 mg Oral BID   ferrous gluconate  324 mg Oral Daily   fluticasone furoate-vilanterol  1 puff Inhalation Daily   gabapentin  100 mg Oral BID   heparin  5,000 Units Subcutaneous Q8H   insulin aspart  0-5 Units Subcutaneous QHS   insulin aspart  0-6 Units Subcutaneous TID WC   insulin glargine-yfgn  10 Units Subcutaneous Daily   linezolid  600 mg Oral Q12H   lipase/protease/amylase  24,000 Units Oral TID   pantoprazole  40 mg Oral Daily   polyethylene glycol  17 g Oral BID   senna-docusate  1 tablet Oral BID   sertraline  25 mg Oral Daily   sodium bicarbonate  650 mg Oral BID   sodium chloride flush  3 mL Intravenous Q12H   sodium chloride flush  3 mL Intravenous Q12H     Data Reviewed:   CBG:  Recent Labs  Lab 07/21/23 1710 07/21/23 2127 07/21/23 2338 07/22/23 0822 07/22/23 1150  GLUCAP 186* 64* 79 85 88    SpO2: 97 %  Vitals:   07/21/23 2028 07/22/23 0500 07/22/23 0700 07/22/23 0844  BP: (!) 144/88 (!) 140/78  (!) 116/57  Pulse: 88 75  87  Resp: 20 18    Temp: 98 F (36.7 C) 98 F (36.7 C) (!) 97.5 F (36.4 C)   TempSrc: Oral Oral Oral   SpO2: 93% 97%    Weight:      Height:          Data Reviewed:  Basic Metabolic Panel: Recent Labs  Lab 07/18/23 0503 07/19/23 0632 07/20/23 0713 07/21/23 0830 07/22/23 0516  NA 135 138 137  135 136  K 4.5 3.8 3.9 4.1 3.9  CL 105 110 107 104 103  CO2 16* 20* 23 22 23   GLUCOSE 249* 110* 94 147* 96  BUN 53* 39* 27* 23 19  CREATININE 2.47* 1.96* 1.52* 1.41* 1.46*  CALCIUM 8.2* 8.2* 7.9* 8.3* 8.4*  MG  --  1.4* 2.2 2.0 1.9    CBC: Recent Labs  Lab 07/17/23 1420 07/18/23 0503 07/19/23 0632 07/20/23 0713 07/21/23 0830 07/22/23 0516  WBC 20.7* 15.8* 13.4* 12.9* 13.6* 14.2*  NEUTROABS 17.3*  --   --   --   --   --   HGB 12.5* 10.4* 9.2* 9.3* 10.1* 10.5*  HCT 40.2 32.3* 28.1* 28.6* 30.8* 31.7*  MCV 99.3 95.3 94.3 92.9 93.3 92.7  PLT 408* 332 311 285 229 204    LFT Recent Labs  Lab 07/17/23 1420 07/18/23 0503  AST 31 26  ALT 29 24  ALKPHOS 80 77  BILITOT 0.8 0.8  PROT 8.9* 7.9  ALBUMIN 2.3* 2.1*     Antibiotics: Anti-infectives (From admission, onward)    Start     Dose/Rate Route Frequency Ordered Stop   07/20/23 0930  ceFEPIme (MAXIPIME) 2 g in sodium chloride 0.9 % 100 mL IVPB        2 g 200 mL/hr over 30 Minutes Intravenous Every 12 hours 07/20/23 0921     07/18/23 2100  ceFEPIme (MAXIPIME) 2 g in sodium chloride 0.9 % 100 mL IVPB  Status:  Discontinued        2 g 200 mL/hr over 30 Minutes Intravenous Every 24 hours 07/17/23 2349 07/20/23 0921   07/18/23 1000  linezolid (ZYVOX) tablet 600 mg        600 mg Oral Every 12 hours 07/18/23 0804     07/17/23 2349  vancomycin variable dose per unstable renal function (pharmacist dosing)  Status:  Discontinued         Does not apply See admin instructions 07/17/23 2349 07/18/23 0804   07/17/23 1645  ceFEPIme (MAXIPIME) 2 g in sodium chloride 0.9 % 100 mL IVPB        2 g 200 mL/hr over 30 Minutes Intravenous  Once 07/17/23 1633 07/17/23 2143   07/17/23 1645  metroNIDAZOLE (FLAGYL) IVPB 500 mg        500 mg 100 mL/hr over 60 Minutes Intravenous  Once 07/17/23 1633 07/17/23 2304   07/17/23 1645  vancomycin (VANCOCIN) IVPB 1000 mg/200 mL premix        1,000 mg 200 mL/hr over 60 Minutes Intravenous  Once  07/17/23 1633 07/17/23 2305        DVT prophylaxis: Heparin  Code Status: DNR  Family Communication: Discussed with patient's son on phone   CONSULTS podiatry   Subjective   Denies any complaints   Objective    Physical Examination:  Appears in no acute distress S1-S2, regular, no murmur  auscultated Abdomen is soft, nontender, no organomegaly  Status is: Inpatient:             Justin [name]   [facility] If 7PM-7AM, please contact night-coverage at [email or web address], Office  [phone]   07/22/2023, 12:07 PM  LOS: 5 days

## 2023-07-22 NOTE — Progress Notes (Signed)
 Pt refused all PO medications tonight, refused to open mouth and turned head away multiple times. Pt is alert and oriented to self.

## 2023-07-22 NOTE — Consult Note (Incomplete)
 Palliative Medicine Inpatient Consult Note  Consulting Provider:  Meredeth Ide, MD   Reason for consult:   Palliative Care Consult Services Palliative Medicine Consult  Reason for Consult? Goals of care   07/22/2023  HPI:  Per intake H&P --> 89 year old with history of CKD 3B, chronic pancreatitis, HTN, DM2, HLD, chronic iron deficiency anemia, adenocarcinoma, CVA with right-sided hemiparesis, TIA, vascular/Alzheimer dementia, left TMA, left third digit amputation 06/19/2023 admitted for nausea and vomiting. He is currently on doxycycline for left foot infection. CT abdomen pelvis showing acute cystitis with concern for constipation/early sterile colitis. Podiatry has been consulted. MRI is consistent with osteomyelitis, podiatry to discuss further surgical options.   Justin [name] has been seen multiple times by the PMT in the past. During this admission we have been consulted to further address goals of care.  Clinical Assessment/Goals of Care:  *Please note that this is a verbal dictation therefore any spelling or grammatical errors are due to the "Dragon Medical One" system interpretation.  I have reviewed medical records including EPIC notes, labs and imaging, received report from bedside RN, assessed the patient.    I met with *** to further discuss diagnosis prognosis, GOC, EOL wishes, disposition and options.   I introduced Palliative Medicine as specialized medical care for people living with serious illness. It focuses on providing relief from the symptoms and stress of a serious illness. The goal is to improve quality of life for both the patient and the family.  Medical History Review and Understanding:  DM type II, essential hypertension, hypercholesteremia, iron deficiency anemia, chronic pancreatitis, colonic adenocarcinoma in 2015, CKD stage IIIb, history of CVA with residual mild right-sided hemiparesis, TIA, GI Bleed, mixed vascular and alzheimer's dementia, and  amputation of (R) toes   Social History:  He has been married for over 64 years. She shares he is a strong-willed man. 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   Functional and Nutritional State:  Patient has resided at [facility] for 4 years.   As far as functional and nutritional status he has been essentially bedbound/wheelchair bound since he's been at [facility]. They share he eats well when they bring him food he prefers to SNF - however they do admit he mostly prefers sweets.  Advance Directives:  A detailed discussion was had today regarding advanced directives.    Code Status:  Concepts specific to code status, artifical feeding and hydration, continued IV antibiotics and rehospitalization was had.  The difference between a aggressive medical intervention path  and a palliative comfort care path for this patient at this time was had.   Justin [name] is an established DNAR/DNI code status.   MOST from that was completed in June of 2024   Cardiopulmonary Resuscitation: Do Not Attempt Resuscitation (DNR/No CPR)  Medical Interventions: Limited Additional Interventions: Use medical treatment, IV fluids and cardiac monitoring as indicated, DO NOT USE intubation or mechanical ventilation. May consider use of less invasive airway support such as BiPAP or CPAP. Also provide comfort measures. Transfer to the hospital if indicated. Avoid intensive care.   Antibiotics: Antibiotics if indicated  IV Fluids: IV fluids if indicated  Feeding Tube: No feeding tube     Discussion:    Discussed the importance of continued conversation with family and their  medical providers regarding overall plan of care and treatment options, ensuring decisions are within the context of the patients values and GOCs.  Provided *** "Hard Choices for  Loving People" booklet.   Provided *** "Gone From My Site" booklet.  Decision Maker:  SUMMARY OF RECOMMENDATIONS    Code  Status/Advance Care Planning: FULL CODE  DNAR/DNI  Modified CODE   Symptom Management:   Palliative Prophylaxis:  Aspiration, Bowel Regimen, Delirium Protocol, Frequent Pain Assessment, Oral Care, Palliative Wound Care, and Turn Reposition  Additional Recommendations (Limitations, Scope, Preferences): Avoid Hospitalization, Full Scope Treatment, No Artificial Feeding, No Surgical Procedures, and No Tracheostomy  Psycho-social/Spiritual:  Desire for further Chaplaincy support:  Additional Recommendations:    Prognosis:   Discharge Planning:   ROS  Oral Intake %:   I/O:   Bowel Movements:   Mobility:   Vitals:   07/22/23 0700 07/22/23 0844  BP:  (!) 116/57  Pulse:  87  Resp:    Temp: (!) 97.5 F (36.4 C)   SpO2:     No intake or output data in the 24 hours ending 07/22/23 1545 Last Weight  Most recent update: 07/17/2023  2:59 PM    Weight  68 kg (150 lb)             Gen:  NAD HEENT: moist mucous membranes CV: Regular rate and rhythm, no murmurs rubs or gallops PULM: clear to auscultation bilaterally. No wheezes/rales/rhonchi*** ABD: soft/nontender/nondistended/normal bowel sounds*** EXT: No edema*** Neuro: Alert and oriented x3***  PPS:   This conversation/these recommendations were discussed with patient primary care team, Dr. Aaron Aas  Time In: Time Out: Total Time: ***  Billing based on MDM: ***  {Problems Addressed:304933}  {Amount and/or Complexity of ZOXW:960454}  {Risks:304936} ______________________________________________________ Camille Cedars  Palliative Medicine Team Team Cell Phone: [phone] Please utilize secure chat with additional questions, if there is no response within 30 minutes please call the above phone number  Palliative Medicine Team providers are available by phone from 7am to 7pm daily and can be reached through the team cell phone.  Should this patient require assistance outside of these hours, please  call the patient's attending physician.

## 2023-07-23 ENCOUNTER — Ambulatory Visit: Payer: Medicare Other | Admitting: Neurology

## 2023-07-23 DIAGNOSIS — N189 Chronic kidney disease, unspecified: Secondary | ICD-10-CM | POA: Diagnosis not present

## 2023-07-23 DIAGNOSIS — I96 Gangrene, not elsewhere classified: Secondary | ICD-10-CM | POA: Diagnosis not present

## 2023-07-23 DIAGNOSIS — K861 Other chronic pancreatitis: Secondary | ICD-10-CM | POA: Diagnosis not present

## 2023-07-23 DIAGNOSIS — Z515 Encounter for palliative care: Secondary | ICD-10-CM | POA: Diagnosis not present

## 2023-07-23 DIAGNOSIS — Z7189 Other specified counseling: Secondary | ICD-10-CM | POA: Diagnosis not present

## 2023-07-23 DIAGNOSIS — N179 Acute kidney failure, unspecified: Secondary | ICD-10-CM | POA: Diagnosis not present

## 2023-07-23 LAB — BASIC METABOLIC PANEL WITH GFR
Anion gap: 12 (ref 5–15)
BUN: 18 mg/dL (ref 8–23)
CO2: 20 mmol/L — ABNORMAL LOW (ref 22–32)
Calcium: 8.3 mg/dL — ABNORMAL LOW (ref 8.9–10.3)
Chloride: 105 mmol/L (ref 98–111)
Creatinine, Ser: 1.62 mg/dL — ABNORMAL HIGH (ref 0.61–1.24)
GFR, Estimated: 41 mL/min — ABNORMAL LOW (ref >60)
Glucose, Bld: 97 mg/dL (ref 70–99)
Potassium: 3.6 mmol/L (ref 3.5–5.1)
Sodium: 137 mmol/L (ref 135–145)

## 2023-07-23 LAB — CBC
HCT: 32.8 % — ABNORMAL LOW (ref 39.0–52.0)
Hemoglobin: 10.6 g/dL — ABNORMAL LOW (ref 13.0–17.0)
MCH: 30 pg (ref 26.0–34.0)
MCHC: 32.3 g/dL (ref 30.0–36.0)
MCV: 92.9 fL (ref 80.0–100.0)
Platelets: 270 10*3/uL (ref 150–400)
RBC: 3.53 MIL/uL — ABNORMAL LOW (ref 4.22–5.81)
RDW: 13.7 % (ref 11.5–15.5)
WBC: 15.8 10*3/uL — ABNORMAL HIGH (ref 4.0–10.5)
nRBC: 0 % (ref 0.0–0.2)

## 2023-07-23 LAB — GLUCOSE, CAPILLARY
Glucose-Capillary: 106 mg/dL — ABNORMAL HIGH (ref 70–99)
Glucose-Capillary: 116 mg/dL — ABNORMAL HIGH (ref 70–99)
Glucose-Capillary: 137 mg/dL — ABNORMAL HIGH (ref 70–99)
Glucose-Capillary: 138 mg/dL — ABNORMAL HIGH (ref 70–99)

## 2023-07-23 LAB — MAGNESIUM: Magnesium: 1.7 mg/dL (ref 1.7–2.4)

## 2023-07-23 NOTE — Progress Notes (Addendum)
 [facility]  PROGRESS NOTE  Justin [name]:[id number] DOB: Oct 08, 1934 DOA: 07/17/2023 PCP: Sharlet Salina, MD   Brief HPI:   89 year old with history of CKD 3B, chronic pancreatitis, HTN, DM2, HLD, chronic iron deficiency anemia, adenocarcinoma, CVA with right-sided hemiparesis, TIA, vascular/Alzheimer dementia, left TMA, left third digit amputation 06/19/2023 admitted for nausea and vomiting. He is currently on doxycycline for left foot infection. CT abdomen pelvis showing acute cystitis with concern for constipation/early sterile colitis. Podiatry has been consulted. MRI is consistent with osteomyelitis, podiatry to discuss further surgical options.     Assessment/Plan:   Sepsis Left-sided foot second digit dry gangrene with gangrene History of right metatarsal amputation Sepsis physiology has resolved MRI is consistent with osteomyelitis.  Podiatry was consulted -Spoke to patient's son yesterday, he initially agreed for surgery -But when podiatrist, Dr. Melvenia Beam went to speak to family, they refused surgery - Continue cefepime and linezolid - Palliative care has been consulted  - Incidentally patient was evaluated by vascular surgery for gangrene in January of this year at that time underwent angiogram, patient was optimized for surgery, no further intervention was recommended per vascular surgery.  Patient family refused surgery at that time. - They continue to refuse surgery  Goals of care - Called and discussed with patient's daughter, [name].  Discussed that patient is clinically declining and will not be a good candidate for artificial nutrition with feeding tube. - He will be a good candidate for hospice, he can either go to skilled facility with hospice versus residual hospice. - Palliative care to follow tomorrow to discuss with patient's family   Acute kidney injury superimposed CKD stage IIIb, improving -?  UTI -Admission creatinine 2.57, baseline 1.7.  Continue IV  fluids.  Creatinine 1.96 - Urine culture grew multiple species    High anion metabolic acidosis - Likely from underlying renal issues.  Bicarb around 15, on sodium bicarb twice daily tablets   Chronic pancreatitis Nausea and vomiting - Supportive care.  Continue Creon   Constipation with possible early steroid colitis - Aggressive bowel regimen, enema   Non-insulin-dependent DM type type II - Continue sliding scale SSI with mealtime coverage.  A1c 3 months ago 8.8 - Repeat hemoglobin A1c is 9.2   Essential hypertension - Holding home medications.  IV as needed   History of CVA -Continue aspirin and Lipitor.   Iron deficiency anemia Iron supplements with bowel regimen   History of dementia without behavioral disturbance. - Depakote twice daily, Zoloft daily.  Atarax as needed     Reactive airway disease -Stable.  Continue Breo Ellipta and Combivent as needed       Medications     aspirin  81 mg Oral Daily   atorvastatin  40 mg Oral QHS   divalproex  125 mg Oral BID   ferrous gluconate  324 mg Oral Daily   fluticasone furoate-vilanterol  1 puff Inhalation Daily   gabapentin  100 mg Oral BID   heparin  5,000 Units Subcutaneous Q8H   insulin aspart  0-5 Units Subcutaneous QHS   insulin aspart  0-6 Units Subcutaneous TID WC   insulin glargine-yfgn  10 Units Subcutaneous Daily   linezolid  600 mg Oral Q12H   lipase/protease/amylase  24,000 Units Oral TID   pantoprazole  40 mg Oral Daily   polyethylene glycol  17 g Oral BID   senna-docusate  1 tablet Oral BID   sertraline  25 mg Oral Daily   sodium bicarbonate  650 mg  Oral BID   sodium chloride flush  3 mL Intravenous Q12H   sodium chloride flush  3 mL Intravenous Q12H     Data Reviewed:   CBG:  Recent Labs  Lab 07/22/23 1150 07/22/23 1636 07/22/23 2022 07/23/23 0745 07/23/23 1148  GLUCAP 88 125* 85 106* 116*    SpO2: 99 %    Vitals:   07/22/23 2014 07/23/23 0516 07/23/23 0627 07/23/23 0814   BP: (!) 126/97 (!) 160/70  (!) 140/105  Pulse: 81 (!) 55 85 100  Resp: 18 15    Temp: 98.5 F (36.9 C) (!) 97.5 F (36.4 C)    TempSrc: Oral Oral    SpO2: 100% (!) 81% 95% 99%  Weight:      Height:          Data Reviewed:  Basic Metabolic Panel: Recent Labs  Lab 07/19/23 0632 07/20/23 0713 07/21/23 0830 07/22/23 0516 07/23/23 0454  NA 138 137 135 136 137  K 3.8 3.9 4.1 3.9 3.6  CL 110 107 104 103 105  CO2 20* 23 22 23  20*  GLUCOSE 110* 94 147* 96 97  BUN 39* 27* 23 19 18   CREATININE 1.96* 1.52* 1.41* 1.46* 1.62*  CALCIUM 8.2* 7.9* 8.3* 8.4* 8.3*  MG 1.4* 2.2 2.0 1.9 1.7    CBC: Recent Labs  Lab 07/17/23 1420 07/18/23 0503 07/19/23 0632 07/20/23 0713 07/21/23 0830 07/22/23 0516 07/23/23 0454  WBC 20.7*   < > 13.4* 12.9* 13.6* 14.2* 15.8*  NEUTROABS 17.3*  --   --   --   --   --   --   HGB 12.5*   < > 9.2* 9.3* 10.1* 10.5* 10.6*  HCT 40.2   < > 28.1* 28.6* 30.8* 31.7* 32.8*  MCV 99.3   < > 94.3 92.9 93.3 92.7 92.9  PLT 408*   < > 311 285 229 204 270   < > = values in this interval not displayed.    LFT Recent Labs  Lab 07/17/23 1420 07/18/23 0503  AST 31 26  ALT 29 24  ALKPHOS 80 77  BILITOT 0.8 0.8  PROT 8.9* 7.9  ALBUMIN 2.3* 2.1*     Antibiotics: Anti-infectives (From admission, onward)    Start     Dose/Rate Route Frequency Ordered Stop   07/20/23 0930  ceFEPIme (MAXIPIME) 2 g in sodium chloride 0.9 % 100 mL IVPB        2 g 200 mL/hr over 30 Minutes Intravenous Every 12 hours 07/20/23 0921     07/18/23 2100  ceFEPIme (MAXIPIME) 2 g in sodium chloride 0.9 % 100 mL IVPB  Status:  Discontinued        2 g 200 mL/hr over 30 Minutes Intravenous Every 24 hours 07/17/23 2349 07/20/23 0921   07/18/23 1000  linezolid (ZYVOX) tablet 600 mg        600 mg Oral Every 12 hours 07/18/23 0804     07/17/23 2349  vancomycin variable dose per unstable renal function (pharmacist dosing)  Status:  Discontinued         Does not apply See admin instructions  07/17/23 2349 07/18/23 0804   07/17/23 1645  ceFEPIme (MAXIPIME) 2 g in sodium chloride 0.9 % 100 mL IVPB        2 g 200 mL/hr over 30 Minutes Intravenous  Once 07/17/23 1633 07/17/23 2143   07/17/23 1645  metroNIDAZOLE (FLAGYL) IVPB 500 mg        500 mg 100 mL/hr over  60 Minutes Intravenous  Once 07/17/23 1633 07/17/23 2304   07/17/23 1645  vancomycin (VANCOCIN) IVPB 1000 mg/200 mL premix        1,000 mg 200 mL/hr over 60 Minutes Intravenous  Once 07/17/23 1633 07/17/23 2305        DVT prophylaxis: Heparin  Code Status: DNR  Family Communication: Discussed with patient's son on phone   CONSULTS podiatry   Subjective   Continues to have poor p.o. intake.   Objective    Physical Examination:  Appears in no acute distress S1-S2, regular Lungs clear to auscultation bilaterally  Status is: Inpatient:             Justin [name]   [facility] If 7PM-7AM, please contact night-coverage at [email or web address], Office  [phone]   07/23/2023, 2:46 PM  LOS: 6 days

## 2023-07-24 ENCOUNTER — Encounter: Admitting: Podiatry

## 2023-07-24 DIAGNOSIS — Z7189 Other specified counseling: Secondary | ICD-10-CM | POA: Diagnosis not present

## 2023-07-24 DIAGNOSIS — I96 Gangrene, not elsewhere classified: Secondary | ICD-10-CM | POA: Diagnosis not present

## 2023-07-24 DIAGNOSIS — N179 Acute kidney failure, unspecified: Secondary | ICD-10-CM | POA: Diagnosis not present

## 2023-07-24 DIAGNOSIS — Z515 Encounter for palliative care: Secondary | ICD-10-CM | POA: Diagnosis not present

## 2023-07-24 LAB — BASIC METABOLIC PANEL WITH GFR
Anion gap: 16 — ABNORMAL HIGH (ref 5–15)
BUN: 25 mg/dL — ABNORMAL HIGH (ref 8–23)
CO2: 15 mmol/L — ABNORMAL LOW (ref 22–32)
Calcium: 8.5 mg/dL — ABNORMAL LOW (ref 8.9–10.3)
Chloride: 107 mmol/L (ref 98–111)
Creatinine, Ser: 1.96 mg/dL — ABNORMAL HIGH (ref 0.61–1.24)
GFR, Estimated: 33 mL/min — ABNORMAL LOW (ref >60)
Glucose, Bld: 182 mg/dL — ABNORMAL HIGH (ref 70–99)
Potassium: 4.1 mmol/L (ref 3.5–5.1)
Sodium: 138 mmol/L (ref 135–145)

## 2023-07-24 LAB — MAGNESIUM: Magnesium: 2 mg/dL (ref 1.7–2.4)

## 2023-07-24 LAB — CBC
HCT: 34.7 % — ABNORMAL LOW (ref 39.0–52.0)
Hemoglobin: 11.1 g/dL — ABNORMAL LOW (ref 13.0–17.0)
MCH: 30.2 pg (ref 26.0–34.0)
MCHC: 32 g/dL (ref 30.0–36.0)
MCV: 94.6 fL (ref 80.0–100.0)
Platelets: 343 10*3/uL (ref 150–400)
RBC: 3.67 MIL/uL — ABNORMAL LOW (ref 4.22–5.81)
RDW: 13.9 % (ref 11.5–15.5)
WBC: 17.4 10*3/uL — ABNORMAL HIGH (ref 4.0–10.5)
nRBC: 0 % (ref 0.0–0.2)

## 2023-07-24 LAB — GLUCOSE, CAPILLARY
Glucose-Capillary: 205 mg/dL — ABNORMAL HIGH (ref 70–99)
Glucose-Capillary: 214 mg/dL — ABNORMAL HIGH (ref 70–99)

## 2023-07-24 MED ORDER — GLYCOPYRROLATE 0.2 MG/ML IJ SOLN: 0.2000 mg | INTRAMUSCULAR | Status: DC | PRN

## 2023-07-24 MED ORDER — POLYVINYL ALCOHOL 1.4 % OP SOLN: 1.0000 [drp] | Freq: Four times a day (QID) | OPHTHALMIC | Status: DC | PRN

## 2023-07-24 MED ORDER — BIOTENE DRY MOUTH MT LIQD: 15.0000 mL | OROMUCOSAL | Status: DC | PRN

## 2023-07-24 MED ORDER — LINEZOLID 600 MG/300ML IV SOLN
600.0000 mg | Freq: Two times a day (BID) | INTRAVENOUS | Status: DC
  Filled 2023-07-24: qty 300

## 2023-07-24 MED ORDER — LORAZEPAM 2 MG/ML IJ SOLN
0.5000 mg | INTRAMUSCULAR | Status: DC | PRN
  Administered 2023-07-24: 1 mg via INTRAVENOUS
  Administered 2023-07-25: 0.5 mg via INTRAVENOUS
  Filled 2023-07-24: qty 1

## 2023-07-24 MED ORDER — LORAZEPAM 2 MG/ML IJ SOLN
0.5000 mg | Freq: Three times a day (TID) | INTRAMUSCULAR | Status: DC
  Administered 2023-07-24 – 2023-07-25 (×4): 0.5 mg via INTRAVENOUS
  Filled 2023-07-24 (×3): qty 1

## 2023-07-24 MED ORDER — MORPHINE SULFATE (PF) 2 MG/ML IV SOLN
1.0000 mg | INTRAVENOUS | Status: DC | PRN
  Administered 2023-07-24: 1 mg via INTRAVENOUS
  Filled 2023-07-24: qty 1

## 2023-07-24 MED ORDER — GLYCOPYRROLATE 1 MG PO TABS: 1.0000 mg | ORAL_TABLET | ORAL | Status: DC | PRN

## 2023-07-24 NOTE — Progress Notes (Signed)
 Palliative Medicine Inpatient Follow Up Note   HPI: 89 year old with history of CKD 3B, chronic pancreatitis, HTN, DM2, HLD, chronic iron deficiency anemia, adenocarcinoma, CVA with right-sided hemiparesis, TIA, vascular/Alzheimer dementia, left TMA, left third digit amputation 06/19/2023 admitted for nausea and vomiting. He is currently on doxycycline for left foot infection. CT abdomen pelvis showing acute cystitis with concern for constipation/early sterile colitis. Podiatry has been consulted. MRI is consistent with osteomyelitis, podiatry to discuss further surgical options.    Mr. Biederman has been seen multiple times by the PMT in the past. During this admission we have been consulted to further address goals of care.  Today's Discussion 07/24/2023  *Please note that this is a verbal dictation therefore any spelling or grammatical errors are due to the "Dragon Medical One" system interpretation.  Chart reviewed inclusive of vital signs, progress notes, laboratory results, and diagnostic images.   I spoke to patients three children over the phone this morning. They had met last night to discuss where to go from here with their fathers care. They have all agreed that he is not eating, drinking, or taking medicine. They realize this limits his time.   We reviewed the options of continuing current care and hoping for the best in the setting of patients OM with present treatment versus allowing him to be made comfortable. We talked about transition to comfort measures in house and what that would entail inclusive of medications to control pain, dyspnea, agitation, nausea, itching, and hiccups.  We discussed stopping all uneccessary measures such as cardiac monitoring, blood draws, needle sticks, and frequent vital signs.   Patients children are all in agreement to pursue comfort care and to transition Naveed to [facility] in [place] to allow family and friends the opportunity to  visit and spend time. They are also aware that antibiotics and mIVF will be stopped at this time.   I shared should patient exemplify the desire to eat that food would be provided but if he does not want to eat/drink then he does not have to. Reviewed that the desire to eat/drink decreases as we encroach end of life.   Utilized reflective listening throughout our time together.   Questions and concerns addressed/Palliative Support Provided.   Primary team, nursing, and TOC informed of the above conversation.   Objective Assessment: Vital Signs Vitals:   07/23/23 2205 07/24/23 0908  BP: (!) 148/81 133/66  Pulse: 86 87  Resp: 18 18  Temp: (!) 97.3 F (36.3 C) 98.5 F (36.9 C)  SpO2: 100% 99%    Intake/Output Summary (Last 24 hours) at 07/24/2023 6045 Last data filed at 07/23/2023 2348 Gross per 24 hour  Intake 200 ml  Output 200 ml  Net 0 ml   Last Weight  Most recent update: 07/17/2023  2:59 PM    Weight  68 kg (150 lb)            Gen:  Elderly AA M chronically ill appearing HEENT: Dry mucous membranes CV: Regular rate and rhythm  PULM:  On RA, breathing is even and nonlabored ABD: soft/nontender  EXT: No edema  Neuro: Alert and oriented to self  SUMMARY OF RECOMMENDATIONS   DNAR/DNI  Comfort care  Have added low dose ativan Q8H ATC  Additional comfort medications per [facility]  Unrestricted visitation  Appreciate TOC helping to coordinate [facility] evaluation and transfer  Ongoing PMT support  Time Spent: 28 Billing based on MDM: High ______________________________________________________________________________________ Marcelino Duster  [facility] Palliative Medicine Team Team Cell Phone: [phone] Please utilize secure chat with additional questions, if there is no response within 30 minutes please call the above phone number  Palliative Medicine Team providers are available by phone from 7am to 7pm daily and can be reached through the  team cell phone.  Should this patient require assistance outside of these hours, please call the patient's attending physician.

## 2023-07-24 NOTE — Progress Notes (Signed)
  Progress Note   Patient: Justin [name] YNW:[id number] DOB: 01/12/1935 DOA: 07/17/2023     7 DOS: the patient was seen and examined on 07/24/2023   Brief hospital course: 89 year old with history of CKD 3B, chronic pancreatitis, HTN, DM2, HLD, chronic iron deficiency anemia, adenocarcinoma, CVA with right-sided hemiparesis, TIA, vascular/Alzheimer dementia, left TMA, left third digit amputation 06/19/2023 admitted for nausea and vomiting. He is currently on doxycycline for left foot infection. CT abdomen pelvis showing acute cystitis with concern for constipation/early sterile colitis. Podiatry has been consulted. MRI is consistent with osteomyelitis   Assessment and Plan: Sepsis Left-sided foot second digit dry gangrene with gangrene History of right metatarsal amputation Sepsis physiology has resolved MRI is consistent with osteomyelitis.  Podiatry was consulted -Spoke to patient's son yesterday, he initially agreed for surgery -But when podiatrist, Dr. Laverta Potters went to speak to family, they refused surgery - Palliative care has been following. Decision was made to transition to full comfort. Pt is not tolerating any PO at this point -Residential hospice being planned     Acute kidney injury superimposed CKD stage IIIb, improving -?  UTI -Admission creatinine 2.57, baseline 1.7.  Continue IV fluids.  Creatinine 1.96 - Urine culture grew multiple species     High anion metabolic acidosis - Likely from underlying renal issues.  Bicarb around 15, on sodium bicarb twice daily tablets -Now transitioned to full comfort   Chronic pancreatitis Nausea and vomiting - Supportive care.  Continue Creon   Constipation with possible early steroid colitis - Aggressive bowel regimen, enema   Non-insulin-dependent DM type type II - Continue sliding scale SSI with mealtime coverage.  A1c 3 months ago 8.8 - Repeat hemoglobin A1c is 9.2   Essential hypertension - Holding home medications.  -Now  focus on comfort   History of CVA -Later transitioned to full comfort   Iron deficiency anemia Later transitioned to comfort   History of dementia without behavioral disturbance. - continue with anxyolytics as needed     Reactive airway disease -Stable      Subjective: Unable to assess given mentation  Physical Exam: Vitals:   07/23/23 0814 07/23/23 1634 07/23/23 2205 07/24/23 0908  BP: (!) 140/105 123/68 (!) 148/81 133/66  Pulse: 100  86 87  Resp:   18 18  Temp:   (!) 97.3 F (36.3 C) 98.5 F (36.9 C)  TempSrc:   Oral   SpO2: 99%  100% 99%  Weight:      Height:       General exam: Awake, laying in bed, appears uncomfortable Respiratory system: Normal respiratory effort, no wheezing Cardiovascular system: regular rate, s1, s2 Gastrointestinal system: Soft, nondistended, positive BS Central nervous system: CN2-12 grossly intact, strength intact Extremities: Perfused, no clubbing Skin: Normal skin turgor, no notable skin lesions seen Psychiatry: unable to assess given mentation  Data Reviewed:  Labs reviewed: Na 138, K 4.1, Cr 1.96, WBC 17.4, Hgb 11.1  Family Communication: Pt in room, family at bedside  Disposition: Status is: Inpatient Remains inpatient appropriate because: severity of illness  Planned Discharge Destination:  Hospice   Author: Cherylle Corwin, MD 07/24/2023 5:28 PM  For on call review [email or web address].

## 2023-07-24 NOTE — TOC Progression Note (Signed)
 Transition of Care [facility]) - Progression Note    Patient Details  Name: Justin [name] MRN: [id number] Date of Birth: 1934/07/24  Transition of Care [facility]) CM/SW Contact  Tandy Fam, [place] Phone Number: 07/24/2023, 3:28 PM  Clinical Narrative:   CSW updated by palliative NP that patient's family has chosen to transition to comfort care, preference for Hospice Home in [facility]. Referral was sent and patient approved, but family does not want to move until tomorrow. CSW to follow.    Expected Discharge Plan: Hospice Medical Facility Barriers to Discharge: Continued Medical Work up  Expected Discharge Plan and Services     Post Acute Care Choice: Skilled Nursing Facility Living arrangements for the past 2 months: Skilled Nursing Facility                                       Social Determinants of Health (SDOH) Interventions SDOH Screenings   Food Insecurity: Patient Unable To Answer (07/18/2023)  Housing: Patient Unable To Answer (07/18/2023)  Transportation Needs: Patient Unable To Answer (07/18/2023)  Utilities: Patient Unable To Answer (07/18/2023)  Depression (PHQ2-9): Low Risk  (02/13/2022)  Social Connections: Patient Unable To Answer (07/18/2023)  Tobacco Use: Medium Risk (07/17/2023)    Readmission Risk Interventions    04/23/2023    2:00 PM 01/26/2022   11:24 AM  Readmission Risk Prevention Plan  Transportation Screening Complete Complete  PCP or Specialist Appt within 3-5 Days  Complete  HRI or Home Care Consult  Complete  Social Work Consult for Recovery Care Planning/Counseling  Complete  Palliative Care Screening  Not Applicable  Medication Review Oceanographer) Complete Complete  SW Recovery Care/Counseling Consult Complete   Palliative Care Screening Complete   Skilled Nursing Facility Complete

## 2023-07-24 NOTE — Plan of Care (Signed)
  Problem: Education: Goal: Ability to describe self-care measures that may prevent or decrease complications (Diabetes Survival Skills Education) will improve Outcome: Progressing Goal: Individualized Educational Video(s) Outcome: Progressing   Problem: Coping: Goal: Ability to adjust to condition or change in health will improve Outcome: Progressing   Problem: Fluid Volume: Goal: Ability to maintain a balanced intake and output will improve Outcome: Progressing   Problem: Health Behavior/Discharge Planning: Goal: Ability to identify and utilize available resources and services will improve Outcome: Progressing Goal: Ability to manage health-related needs will improve Outcome: Progressing   Problem: Metabolic: Goal: Ability to maintain appropriate glucose levels will improve Outcome: Progressing   Problem: Nutritional: Goal: Maintenance of adequate nutrition will improve Outcome: Progressing Goal: Progress toward achieving an optimal weight will improve Outcome: Progressing   Problem: Skin Integrity: Goal: Risk for impaired skin integrity will decrease Outcome: Progressing   Problem: Tissue Perfusion: Goal: Adequacy of tissue perfusion will improve Outcome: Progressing   Problem: Education: Goal: Knowledge of General Education information will improve Description: Including pain rating scale, medication(s)/side effects and non-pharmacologic comfort measures Outcome: Progressing   Problem: Health Behavior/Discharge Planning: Goal: Ability to manage health-related needs will improve Outcome: Progressing   Problem: Clinical Measurements: Goal: Ability to maintain clinical measurements within normal limits will improve Outcome: Progressing Goal: Will remain free from infection Outcome: Progressing Goal: Diagnostic test results will improve Outcome: Progressing Goal: Respiratory complications will improve Outcome: Progressing Goal: Cardiovascular complication will  be avoided Outcome: Progressing   Problem: Activity: Goal: Risk for activity intolerance will decrease Outcome: Progressing   Problem: Coping: Goal: Level of anxiety will decrease Outcome: Progressing   Problem: Elimination: Goal: Will not experience complications related to bowel motility Outcome: Progressing Goal: Will not experience complications related to urinary retention Outcome: Progressing   Problem: Pain Managment: Goal: General experience of comfort will improve and/or be controlled Outcome: Progressing   Problem: Safety: Goal: Ability to remain free from injury will improve Outcome: Progressing   Problem: Skin Integrity: Goal: Risk for impaired skin integrity will decrease Outcome: Progressing   Problem: Education: Goal: Knowledge of the prescribed therapeutic regimen will improve Outcome: Progressing   Problem: Coping: Goal: Ability to identify and develop effective coping behavior will improve Outcome: Progressing   Problem: Clinical Measurements: Goal: Quality of life will improve Outcome: Progressing   Problem: Respiratory: Goal: Verbalizations of increased ease of respirations will increase Outcome: Progressing   Problem: Role Relationship: Goal: Family's ability to cope with current situation will improve Outcome: Progressing Goal: Ability to verbalize concerns, feelings, and thoughts to partner or family member will improve Outcome: Progressing   Problem: Pain Management: Goal: Satisfaction with pain management regimen will improve Outcome: Progressing

## 2023-07-25 DIAGNOSIS — I96 Gangrene, not elsewhere classified: Secondary | ICD-10-CM | POA: Diagnosis not present

## 2023-07-25 DIAGNOSIS — N179 Acute kidney failure, unspecified: Secondary | ICD-10-CM | POA: Diagnosis not present

## 2023-07-25 MED ORDER — POLYVINYL ALCOHOL 1.4 % OP SOLN: 1.0000 [drp] | Freq: Four times a day (QID) | OPHTHALMIC | Status: AC | PRN

## 2023-07-25 MED ORDER — ACETAMINOPHEN 325 MG PO TABS: 650.0000 mg | ORAL_TABLET | Freq: Four times a day (QID) | ORAL | Status: AC | PRN

## 2023-07-25 MED ORDER — BIOTENE DRY MOUTH MT LIQD: 15.0000 mL | OROMUCOSAL | Status: AC | PRN

## 2023-07-25 MED ORDER — GLYCOPYRROLATE 1 MG PO TABS: 1.0000 mg | ORAL_TABLET | ORAL | Status: AC | PRN

## 2023-07-25 NOTE — Progress Notes (Signed)
 Report made to Deb at [facility] in [facility].

## 2023-07-25 NOTE — TOC Transition Note (Signed)
 Transition of Care [facility]) - Discharge Note   Patient Details  Name: Justin [name] MRN: [id number] Date of Birth: 07-20-1934  Transition of [facility]) CM/SW Contact:  Arron Big, LCSWA Phone Number: 07/25/2023, 3:23 PM   Clinical Narrative:  Patient will DC to: Hospice Home in [facility] Anticipated DC date: 07/25/23 Family notified: [name] (dtr) Transport by: Lyna Sandhoff   Per MD patient ready for DC to [facility] in [facility]. RN to call report prior to discharge [phone]). RN, patient, patient's family, and facility notified of DC. Discharge Summary and FL2 sent to facility. DC packet on chart. Ambulance transport requested for patient 3:15PM.   CSW will sign off for now as social work intervention is no longer needed. Please consult us  again if new needs arise.      Final next level of care: Hospice Medical Facility Barriers to Discharge: Barriers Resolved   Patient Goals and CMS Choice            Discharge Placement              Patient chooses bed at: Other - please specify in the comment section below: (Hospice Home in [facility]) Patient to be transferred to facility by: [facility] Name of family member notified: [name] (dtr) Patient and family notified of of transfer: 07/25/23  Discharge Plan and Services Additional resources added to the After Visit Summary for       Post Acute Care Choice: Skilled Nursing Facility                               Social Drivers of Health (SDOH) Interventions SDOH Screenings   Food Insecurity: Patient Unable To Answer (07/18/2023)  Housing: Patient Unable To Answer (07/18/2023)  Transportation Needs: Patient Unable To Answer (07/18/2023)  Utilities: Patient Unable To Answer (07/18/2023)  Depression (PHQ2-9): Low Risk  (02/13/2022)  Social Connections: Patient Unable To Answer (07/18/2023)  Tobacco Use: Medium Risk (07/17/2023)     Readmission Risk Interventions    04/23/2023    2:00 PM 01/26/2022   11:24 AM   Readmission Risk Prevention Plan  Transportation Screening Complete Complete  PCP or Specialist Appt within 3-5 Days  Complete  HRI or Home Care Consult  Complete  Social Work Consult for Recovery Care Planning/Counseling  Complete  Palliative Care Screening  Not Applicable  Medication Review Oceanographer) Complete Complete  SW Recovery Care/Counseling Consult Complete   Palliative Care Screening Complete   Skilled Nursing Facility Complete

## 2023-07-25 NOTE — Discharge Summary (Signed)
 Physician Discharge Summary   Patient: Justin [name] MRN: [id number] DOB: 11-03-1934  Admit date:     07/17/2023  Discharge date: 07/25/23  Discharge Physician: Rickey Barbara   PCP: Marny Lowenstein Verta Ellen, MD   Recommendations at discharge:    Follow up with hospice services  Discharge Diagnoses: Principal Problem:   Sepsis ([name]) Active Problems:   Acute kidney injury superimposed on chronic kidney disease ([name])   Dry gangrene ([name])   Non-insulin dependent type 2 diabetes mellitus ([name])   Chronic pancreatitis ([name])   Essential hypertension   Anemia of chronic disease   History of CVA (cerebrovascular accident)   Iron deficiency anemia   Dementia without behavioral disturbance ([name])   Reactive airway disease   History of dementia   Metabolic acidosis   Gangrene of toe of left foot ([name])   Palliative care by specialist  Resolved Problems:   * No resolved hospital problems. [facility] Course: 89 year old with history of CKD 3B, chronic pancreatitis, HTN, DM2, HLD, chronic iron deficiency anemia, adenocarcinoma, CVA with right-sided hemiparesis, TIA, vascular/Alzheimer dementia, left TMA, left third digit amputation 06/19/2023 admitted for nausea and vomiting. He is currently on doxycycline for left foot infection. CT abdomen pelvis showing acute cystitis with concern for constipation/early sterile colitis. Podiatry has been consulted. MRI is consistent with osteomyelitis   Assessment and Plan: Sepsis Left-sided foot second digit dry gangrene with gangrene History of right metatarsal amputation Sepsis physiology has resolved MRI is consistent with osteomyelitis.  Podiatry was consulted -Spoke to patient's son yesterday, he initially agreed for surgery -But when podiatrist, Dr. Melvenia Beam went to speak to family, they refused surgery - Palliative care has been following. Decision was made to transition to full comfort. Pt is not tolerating any PO at this point -Pt is now planned for  residential hospice     Acute kidney injury superimposed CKD stage IIIb, improving -?  UTI -Admission creatinine 2.57, baseline 1.7.  Continue IV fluids.  Creatinine 1.96 - Urine culture grew multiple species    High anion metabolic acidosis - Likely from underlying renal issues.  Bicarb around 15, on sodium bicarb twice daily tablets -Now transitioned to full comfort   Chronic pancreatitis Nausea and vomiting - Supportive care.  Continue Creon   Constipation with possible early steroid colitis - Aggressive bowel regimen, enema   Non-insulin-dependent DM type type II - Continue sliding scale SSI with mealtime coverage.  A1c 3 months ago 8.8 - Repeat hemoglobin A1c is 9.2   Essential hypertension - Holding home medications.  - Now focus on comfort   History of CVA -Later transitioned to full comfort   Iron deficiency anemia Later transitioned to comfort   History of dementia without behavioral disturbance. - continue with anxyolytics as needed    Reactive airway disease -Stable       Consultants: Palliative Care Procedures performed:   Disposition: Hospice care Diet recommendation:  Regular diet DISCHARGE MEDICATION: Allergies as of 07/25/2023   No Known Allergies      Medication List     STOP taking these medications    ascorbic acid 500 MG tablet Commonly known as: VITAMIN C   aspirin 81 MG chewable tablet   atorvastatin 40 MG tablet Commonly known as: LIPITOR   Breo Ellipta 100-25 MCG/ACT Aepb Generic drug: fluticasone furoate-vilanterol   clopidogrel 75 MG tablet Commonly known as: PLAVIX   Creon 24000-76000 units Cpep Generic drug: Pancrelipase (Lip-Prot-Amyl)   divalproex 125 MG capsule Commonly known as: DEPAKOTE  SPRINKLE   docusate sodium 100 MG capsule Commonly known as: COLACE   doxycycline 100 MG capsule Commonly known as: VIBRAMYCIN   ferrous gluconate 324 MG tablet Commonly known as: FERGON   gabapentin 100 MG  capsule Commonly known as: NEURONTIN   glucagon 1 MG injection   HumaLOG 100 UNIT/ML injection Generic drug: insulin lispro   hydrOXYzine 10 MG tablet Commonly known as: ATARAX   isosorbide mononitrate 30 MG 24 hr tablet Commonly known as: IMDUR   Lantus 100 UNIT/ML injection Generic drug: insulin glargine   loratadine 10 MG tablet Commonly known as: CLARITIN   metoprolol tartrate 25 MG tablet Commonly known as: LOPRESSOR   MINERIN CREME EX   Mucinex InstaSoothe Throat/Cgh 5 MG Lozg Generic drug: Dextromethorphan HBr   multivitamin with minerals Tabs tablet   Nasal Mist 0.9 % Aers   ondansetron 4 MG disintegrating tablet Commonly known as: ZOFRAN-ODT   oxyCODONE 5 MG immediate release tablet Commonly known as: Oxy IR/ROXICODONE   oxymetazoline 0.05 % nasal spray Commonly known as: AFRIN   pantoprazole 40 MG tablet Commonly known as: Protonix   Pro-Stat AWC Liqd   sertraline 25 MG tablet Commonly known as: ZOLOFT   Spacer/Aero-Holding Chambers Devi   zinc sulfate (50mg  elemental zinc) 220 (50 Zn) MG capsule       TAKE these medications    acetaminophen 325 MG tablet Commonly known as: TYLENOL Take 2 tablets (650 mg total) by mouth every 6 (six) hours as needed for mild pain (pain score 1-3) (or Fever >/= 101). What changed:  when to take this reasons to take this   antiseptic oral rinse Liqd Apply 15 mLs topically as needed for dry mouth.   glycopyrrolate 1 MG tablet Commonly known as: ROBINUL Take 1 tablet (1 mg total) by mouth every 4 (four) hours as needed (excessive secretions).   Ipratropium-Albuterol 20-100 MCG/ACT Aers respimat Commonly known as: COMBIVENT Inhale 1 puff into the lungs every 6 (six) hours as needed for wheezing.   polyvinyl alcohol 1.4 % ophthalmic solution Commonly known as: LIQUIFILM TEARS Place 1 drop into both eyes 4 (four) times daily as needed for dry eyes.        Contact information for follow-up providers      Follow up with hospice services Follow up.   Why: Hospital follow up             Contact information for after-discharge care     Destination     [facility], [facility] Preferred SNF .   Service: Skilled Nursing Contact information: [address] [place] [address] [phone]                    Discharge Exam: Ceasar Mons Weights   07/17/23 1458  Weight: 68 kg   General exam: Awake, laying in bed, in nad Respiratory system: Normal respiratory effort, no wheezing Cardiovascular system: regular rate, s1, s2 Gastrointestinal system: Soft, nondistended, positive BS Central nervous system: CN2-12 grossly intact, strength intact Extremities: Perfused, no clubbing Skin: Normal skin turgor, no notable skin lesions seen Psychiatry: Unable to assess given mentation  Condition at discharge: poor  The results of significant diagnostics from this hospitalization (including imaging, microbiology, ancillary and laboratory) are listed below for reference.   Imaging Studies: VAS [place] LOWER EXTREMITY ARTERIAL DUPLEX Result Date: 07/21/2023 LOWER EXTREMITY ARTERIAL DUPLEX STUDY Patient Name:  CYREE CHUONG  Date of Exam:   07/20/2023 Medical Rec #: [id number]  Accession #:    [id number] Date of Birth: 04-13-1934         Patient Gender: M Patient Age:   50 years Exam [facility]:  [facility] Procedure:      VAS [place] LOWER EXTREMITY ARTERIAL DUPLEX Referring Phys: Leta Jungling SEMON --------------------------------------------------------------------------------  Indications: Gangrene, and peripheral artery disease. High Risk Factors: Hypertension, hyperlipidemia, Diabetes, coronary artery                    disease.  Vascular Interventions: Left hallux osteomyelitis, dehiscence at third toe                         amputation site and gangrenous changes. 04/19/23 Left                         anterior tibial angioplasty (3x168mm [place]).  Current  ABI:            R: 0.95 amputated great toe L:0.89 absent toe pressure Limitations: Body habitus, calcific plaque Comparison Study: No prior left LEA on file Performing Technologist: Sherren Kerns RVS  Examination Guidelines: A complete evaluation includes B-mode imaging, spectral Doppler, color Doppler, and power Doppler as needed of all accessible portions of each vessel. Bilateral testing is considered an integral part of a complete examination. Limited examinations for reoccurring indications may be performed as noted.  +----------+--------+-----+---------------+-----------+-----------------+ LEFT      PSV cm/sRatioStenosis       Waveform   Comments          +----------+--------+-----+---------------+-----------+-----------------+ CFA Distal71                          multiphasic                  +----------+--------+-----+---------------+-----------+-----------------+ DFA       80                          multiphasic                  +----------+--------+-----+---------------+-----------+-----------------+ SFA Prox  125                         multiphasic                  +----------+--------+-----+---------------+-----------+-----------------+ SFA Mid   111          30-49% stenosisbiphasic                     +----------+--------+-----+---------------+-----------+-----------------+ SFA Distal64                          biphasic                     +----------+--------+-----+---------------+-----------+-----------------+ POP Prox  42                          biphasic                     +----------+--------+-----+---------------+-----------+-----------------+ POP Mid   41                          monophasic                   +----------+--------+-----+---------------+-----------+-----------------+  POP Distal29                          monophasic                   +----------+--------+-----+---------------+-----------+-----------------+ ATA Prox   22                          monophasic collaterals noted +----------+--------+-----+---------------+-----------+-----------------+ ATA Mid   119          30-49% stenosismonophasic                   +----------+--------+-----+---------------+-----------+-----------------+ ATA Distal70                          monophasic                   +----------+--------+-----+---------------+-----------+-----------------+ PTA Prox  36                          monophasic                   +----------+--------+-----+---------------+-----------+-----------------+ PTA Mid   134          30-49% stenosismonophasic                   +----------+--------+-----+---------------+-----------+-----------------+ PTA Distal129          30-49% stenosismonophasic collaterals noted +----------+--------+-----+---------------+-----------+-----------------+ PERO Prox 56                          monophasic collaterals noted +----------+--------+-----+---------------+-----------+-----------------+ PERO Mid                                         collaterals noted +----------+--------+-----+---------------+-----------+-----------------+ Incidentally, age indeterminate DVT noted in the left popliteal vein.  Summary: Left: 30-49% stenosis noted in the mid superficial femoral artery. 30-49% stenosis noted in the mid anterior tibial artery. 30-49% stenosis noted in the mid and distal posterior tibial artery. Collateral flow noted proximal AT and Peroneal arteries, at the distal PTA, and proximal to mid peroneal arteries. Incidentally, age indeterminate DVT noted in the left popliteal vein.  See table(s) above for measurements and observations. Electronically signed by Gerarda Fraction on 07/21/2023 at 9:29:08 AM.    Final    VAS [place] ABI WITH/WO TBI Result Date: 07/19/2023  LOWER EXTREMITY DOPPLER STUDY Patient Name:  HAIDER HORNADAY  Date of Exam:   07/19/2023 Medical Rec #: [id number]         Accession  #:    [id number] Date of Birth: [birth year]         Patient Gender: M Patient Age:   35 years Exam Location:  [facility] Procedure:      VAS [place] ABI WITH/WO TBI Referring Phys: Carlena Hurl --------------------------------------------------------------------------------  Indications: Gangrene, and peripheral artery disease. High Risk Factors: Hypertension, hyperlipidemia, Diabetes, past history of                    smoking, coronary artery disease, prior CVA. Other Factors: RT TMA (2023), CKD, Left 3rd digit amputation (06/19/2023).  Vascular Interventions: 11/24/2021 RLE balloon angioplasty (PopA, TPT, PeroA),  04/19/2023 LLE balloon angioplasty (ATA). Comparison Study: Previous exam on 06/04/23 Performing Technologist: Arlyce Berger RVT, RDMS  Examination Guidelines: A complete evaluation includes at minimum, Doppler waveform signals and systolic blood pressure reading at the level of bilateral brachial, anterior tibial, and posterior tibial arteries, when vessel segments are accessible. Bilateral testing is considered an integral part of a complete examination. Photoelectric Plethysmograph (PPG) waveforms and toe systolic pressure readings are included as required and additional duplex testing as needed. Limited examinations for reoccurring indications may be performed as noted.  ABI Findings: +---------+------------------+-----+---------+--------+ Right    Rt Pressure (mmHg)IndexWaveform Comment  +---------+------------------+-----+---------+--------+ Brachial 138                    triphasic         +---------+------------------+-----+---------+--------+ PTA      117               0.85 biphasic audibly  +---------+------------------+-----+---------+--------+ DP       131               0.95 biphasic audibly  +---------+------------------+-----+---------+--------+ Great Toe                                TMA       +---------+------------------+-----+---------+--------+ +---------+------------------+-----+----------+-------+ Left     Lt Pressure (mmHg)IndexWaveform  Comment +---------+------------------+-----+----------+-------+ Brachial 130                    triphasic         +---------+------------------+-----+----------+-------+ PTA      62                0.45 monophasic        +---------+------------------+-----+----------+-------+ DP       123               0.89 monophasic        +---------+------------------+-----+----------+-------+ Great Toe0                 0.00 Absent            +---------+------------------+-----+----------+-------+ +-------+-----------+-----------+------------+------------+ ABI/TBIToday's ABIToday's TBIPrevious ABIPrevious TBI +-------+-----------+-----------+------------+------------+ Right  0.95       amputation 0.95        amputation   +-------+-----------+-----------+------------+------------+ Left   0.89       0          1.23        0.63         +-------+-----------+-----------+------------+------------+ Left ABI appears decreased. Left TBI is now absent.  Summary: Right: Resting right ankle-brachial index is within normal range. Left: Resting left ankle-brachial index indicates mild left lower extremity arterial disease. The left toe-brachial index is absent. *See table(s) above for measurements and observations.  Electronically signed by Irvin Mantel on 07/19/2023 at 9:00:35 PM.    Final    MR FOOT LEFT WO CONTRAST Result Date: 07/18/2023 CLINICAL DATA:  Soft tissue infection suspected. EXAM: MRI OF THE LEFT FOOT WITHOUT CONTRAST TECHNIQUE: Multiplanar, multisequence MR imaging of the left forefoot was performed. No intravenous contrast was administered. The technologist reports the patient had altered mental status and kept moving his foot out of the coil. The best possible images were performed. COMPARISON:  Left foot radiographs  06/26/2023, 04/19/2023; MRI left forefoot 04/20/2023 FINDINGS: Despite efforts by the technologist and patient, moderate to high-grade motion artifact is present on today's exam and could not be eliminated. This reduces exam  sensitivity and specificity. Bones/Joint/Cartilage Interval amputation of the third toe phalanges compared to 04/20/2023 MRI, as also seen on 06/26/2023 radiographs. No sagittal STIR image was performed, likely due to patient motion patient condition described by the technologist. On the sequence with the greatest uniformity of fat suppression, the coronal STIR series 10, there is again increased signal within the distal aspect of the distal phalanx of the great toe (coronal series 10, image 22) with again a soft tissue defect within the adjacent distal great toe, again highly suggestive of active osteomyelitis. No definite osteomyelitis is seen within the second, fourth, or fifth toe phalanges. Moderate great toe metatarsophalangeal osteoarthritis including cartilage thinning, subchondral marrow edema, and peripheral osteophytosis. Ligaments The Lisfranc ligament complex is grossly intact. Muscles and Tendons There is moderate fluid around the second and third flexor tendons at the level of the proximal through distal aspect of the metatarsal shaft of the third digit and the mid to distal metatarsal shaft of the second digit (axial series 11 images 4 through 16 and coronal series 10 images 27 and 28). This is increased from prior 04/20/2023 MRI. Soft tissues There is mild subcutaneous fat edema and swelling greatest within the plantar medial forefoot, plantar to the distal first metatarsal and plantar medial aspect of the first toe phalanges. IMPRESSION: Compared to 04/20/2023 MRI: 1. Interval amputation of the third toe phalanges. 2. High-grade motion artifact. On the sequence with the best preserved uniformity of fat suppression, the coronal STIR image, there is again increased signal within  the distal aspect of the distal phalanx of the great toe with again a soft tissue ulcer within the adjacent distal great toe, again highly suggestive of active osteomyelitis. 3. Moderate fluid around the second and third flexor tendons at the level of the proximal through distal aspect of the metatarsal shaft of the third digit and the mid to distal metatarsal shaft of the second digit. This tenosynovitis is increased from prior 04/20/2023 MRI. Electronically Signed   By: Bertina Broccoli M.D.   On: 07/18/2023 10:00   CT ABDOMEN PELVIS WO CONTRAST Result Date: 07/17/2023 CLINICAL DATA:  Abdominal pain, acute, nonlocalized, nausea and vomiting EXAM: CT ABDOMEN AND PELVIS WITHOUT CONTRAST TECHNIQUE: Multidetector CT imaging of the abdomen and pelvis was performed following the standard protocol without IV contrast. Of note, the lack of intravenous contrast limits evaluation of the solid organ parenchyma and vascularity. RADIATION DOSE REDUCTION: This exam was performed according to the departmental dose-optimization program which includes automated exposure control, adjustment of the mA and/or kV according to patient size and/or use of iterative reconstruction technique. COMPARISON:  December 05, 2022, January 03, 2013 FINDINGS: Lower chest: No focal airspace consolidation or pleural effusion.Fibro linear scarring and subsegmental atelectasis in the lung bases. Dense multi-vessel coronary atherosclerosis. Hepatobiliary: Similar clustered chunky parenchymal calcification centered primarily within segment 7 of the liver, which given the stability, this is benign.Cholecystectomy.No intrahepatic or extrahepatic biliary ductal dilation. Pancreas: atrophy of the pancreatic parenchyma. No mass or ductal dilation.No peripancreatic inflammation or fluid collection. Spleen: Normal size. No mass. Adrenals/Urinary Tract: Unchanged nodularity of the left adrenal gland without discrete mass or dominant nodule. No renal mass.  Extensive renal hilar vascular calcification. No hydronephrosis or nephrolithiasis. Decompressed urinary bladder with perivesicular stranding. Stomach/Bowel: Moderate amount of ingested material in the stomach. 1.5 cm radiopaque structure along the gastric antrum (axial 19). No small bowel wall thickening or inflammation. No small bowel obstruction. Normal appendix. Moderate amount of fecal material throughout the proximal colon.  There is a moderate column of formed stool in the rectosigmoid colon measuring approximately 8.5 by 8.4 cm in the rectum. Mild wall thickening at the rectosigmoid colon junction. Vascular/Lymphatic: No aortic aneurysm. Diffuse aortoiliac atherosclerosis. No intraabdominal or pelvic lymphadenopathy. Reproductive: No prostatomegaly.No free pelvic fluid. Other: No pneumoperitoneum, ascites, or mesenteric inflammation. Musculoskeletal: No acute fracture or destructive lesion.Diffuse osteopenia. Chronic compression fractures of L1 and L3, unchanged. Multilevel degenerative disc disease of the spine. Moderate to severe bilateral hip osteoarthritis. IMPRESSION: 1. Findings worrisome for acute cystitis. Correlation with urinalysis recommended. 2. Moderate amount of fecal material throughout the colon, as can be seen in constipation. A significant column of formed stool noted in the rectosigmoid colon, measuring 8.5 x 8.4 cm in the rectum with mild wall thickening of the rectosigmoid junction, as can be seen in early stercoral colitis. 3. Along the gastric antrum (axial 19), there is a 1.5 cm linear radiopaque structure, which may reflect an endoscopic clip or ingested foreign body. Clinical correlation requested. Electronically Signed   By: Wallie Char M.D.   On: 07/17/2023 21:40   DG Chest Port 1 View Result Date: 07/17/2023 CLINICAL DATA:  Questionable sepsis - evaluate for abnormality. Nausea and vomiting EXAM: PORTABLE CHEST 1 VIEW COMPARISON:  Radiograph 01/26/2022. FINDINGS: The heart  is normal in size. Bandlike atelectasis at the left lung base. No confluent airspace disease. No pulmonary edema, large pleural effusion or pneumothorax. IMPRESSION: Bandlike atelectasis at the left lung base. Electronically Signed   By: Narda Rutherford M.D.   On: 07/17/2023 18:55   DG Foot Complete Left Result Date: 06/26/2023 Please see detailed radiograph report in office note.   Microbiology: Results for orders placed or performed during the hospital encounter of 07/17/23  Blood Culture (routine x 2)     Status: Abnormal   Collection Time: 07/17/23  8:20 PM   Specimen: BLOOD RIGHT FOREARM  Result Value Ref Range Status   Specimen Description BLOOD RIGHT FOREARM  Final   Special Requests   Final    BOTTLES DRAWN AEROBIC AND ANAEROBIC Blood Culture adequate volume   Culture  Setup Time   Final    GRAM POSITIVE COCCI IN CLUSTERS IN BOTH AEROBIC AND ANAEROBIC BOTTLES CRITICAL RESULT CALLED TO, READ BACK BY AND VERIFIED WITH: J WYLAND,PHARMD@[id number]  07/18/23 MK    Culture (A)  Final    STAPHYLOCOCCUS CAPITIS THE SIGNIFICANCE OF ISOLATING THIS ORGANISM FROM A SINGLE SET OF BLOOD CULTURES WHEN MULTIPLE SETS ARE DRAWN IS UNCERTAIN. PLEASE NOTIFY THE MICROBIOLOGY DEPARTMENT WITHIN ONE WEEK IF SPECIATION AND SENSITIVITIES ARE REQUIRED. Performed at [facility], [address] [place], [place] [address]    Report Status 07/21/2023 FINAL  Final  Blood Culture ID Panel (Reflexed)     Status: Abnormal   Collection Time: 07/17/23  8:20 PM  Result Value Ref Range Status   Enterococcus faecalis NOT DETECTED NOT DETECTED Final   Enterococcus Faecium NOT DETECTED NOT DETECTED Final   Listeria monocytogenes NOT DETECTED NOT DETECTED Final   Staphylococcus species DETECTED (A) NOT DETECTED Final    Comment: CRITICAL RESULT CALLED TO, READ BACK BY AND VERIFIED WITH: J WYLAND,[email or web address]@[id number]  07/18/23 MK    Staphylococcus aureus (BCID) NOT DETECTED NOT DETECTED Final   Staphylococcus epidermidis  NOT DETECTED NOT DETECTED Final   Staphylococcus lugdunensis NOT DETECTED NOT DETECTED Final   Streptococcus species NOT DETECTED NOT DETECTED Final   Streptococcus agalactiae NOT DETECTED NOT DETECTED Final   Streptococcus pneumoniae NOT DETECTED NOT DETECTED Final  Streptococcus pyogenes NOT DETECTED NOT DETECTED Final   A.calcoaceticus-baumannii NOT DETECTED NOT DETECTED Final   Bacteroides fragilis NOT DETECTED NOT DETECTED Final   Enterobacterales NOT DETECTED NOT DETECTED Final   Enterobacter cloacae complex NOT DETECTED NOT DETECTED Final   Escherichia coli NOT DETECTED NOT DETECTED Final   Klebsiella aerogenes NOT DETECTED NOT DETECTED Final   Klebsiella oxytoca NOT DETECTED NOT DETECTED Final   Klebsiella pneumoniae NOT DETECTED NOT DETECTED Final   Proteus species NOT DETECTED NOT DETECTED Final   Salmonella species NOT DETECTED NOT DETECTED Final   Serratia marcescens NOT DETECTED NOT DETECTED Final   Haemophilus influenzae NOT DETECTED NOT DETECTED Final   Neisseria meningitidis NOT DETECTED NOT DETECTED Final   Pseudomonas aeruginosa NOT DETECTED NOT DETECTED Final   Stenotrophomonas maltophilia NOT DETECTED NOT DETECTED Final   Candida albicans NOT DETECTED NOT DETECTED Final   Candida auris NOT DETECTED NOT DETECTED Final   Candida glabrata NOT DETECTED NOT DETECTED Final   Candida krusei NOT DETECTED NOT DETECTED Final   Candida parapsilosis NOT DETECTED NOT DETECTED Final   Candida tropicalis NOT DETECTED NOT DETECTED Final   Cryptococcus neoformans/gattii NOT DETECTED NOT DETECTED Final    Comment: Performed at [facility], [address] [place], [place] [address]  Blood Culture (routine x 2)     Status: None   Collection Time: 07/17/23  8:55 PM   Specimen: BLOOD RIGHT FOREARM  Result Value Ref Range Status   Specimen Description BLOOD RIGHT FOREARM  Final   Special Requests   Final    BOTTLES DRAWN AEROBIC AND ANAEROBIC Blood Culture results may not be  optimal due to an inadequate volume of blood received in culture bottles   Culture   Final    NO GROWTH 5 DAYS Performed at [facility], [address] [place], [place] [address]    Report Status 07/22/2023 FINAL  Final  Resp panel by RT-PCR (RSV, Flu A&B, Covid) Anterior Nasal Swab     Status: None   Collection Time: 07/17/23 11:36 PM   Specimen: Anterior Nasal Swab  Result Value Ref Range Status   SARS Coronavirus 2 by RT PCR NEGATIVE NEGATIVE Final   Influenza A by PCR NEGATIVE NEGATIVE Final   Influenza B by PCR NEGATIVE NEGATIVE Final    Comment: (NOTE) The Xpert Xpress SARS-CoV-2/FLU/RSV plus assay is intended as an aid in the diagnosis of influenza from Nasopharyngeal swab specimens and should not be used as a sole basis for treatment. Nasal washings and aspirates are unacceptable for Xpert Xpress SARS-CoV-2/FLU/RSV testing.  Fact Sheet for Patients: BloggerCourse.com  Fact Sheet for Healthcare Providers: SeriousBroker.it  This test is not yet approved or cleared by the [place]  FDA and has been authorized for detection and/or diagnosis of SARS-CoV-2 by FDA under an Emergency Use Authorization (EUA). This EUA will remain in effect (meaning this test can be used) for the duration of the COVID-19 declaration under Section 564(b)(1) of the Act, 21 U.S.C. section 360bbb-3(b)(1), unless the authorization is terminated or revoked.     Resp Syncytial Virus by PCR NEGATIVE NEGATIVE Final    Comment: (NOTE) Fact Sheet for Patients: BloggerCourse.com  Fact Sheet for Healthcare Providers: SeriousBroker.it  This test is not yet approved or cleared by the [place]  FDA and has been authorized for detection and/or diagnosis of SARS-CoV-2 by FDA under an Emergency Use Authorization (EUA). This EUA will remain in effect (meaning this test can be used) for the  duration of the COVID-19 declaration under Section 564(b)(1) of the Act, 21 U.S.C. section 360bbb-3(b)(1), unless the authorization is terminated or revoked.  Performed at [facility], [address], [place], [place] [address]   Urine Culture     Status: Abnormal   Collection Time: 07/17/23 11:50 PM   Specimen: Urine, Random  Result Value Ref Range Status   Specimen Description URINE, RANDOM  Final   Special Requests   Final    NONE Reflexed from [id number] Performed at [facility], [address], [place], [place] [address]    Culture MULTIPLE SPECIES PRESENT, SUGGEST RECOLLECTION (A)  Final   Report Status 07/18/2023 FINAL  Final    Labs: CBC: Recent Labs  Lab 07/20/23 0713 07/21/23 0830 07/22/23 0516 07/23/23 0454 07/24/23 0500  WBC 12.9* 13.6* 14.2* 15.8* 17.4*  HGB 9.3* 10.1* 10.5* 10.6* 11.1*  HCT 28.6* 30.8* 31.7* 32.8* 34.7*  MCV 92.9 93.3 92.7 92.9 94.6  PLT 285 229 204 270 343   Basic Metabolic Panel: Recent Labs  Lab 07/20/23 0713 07/21/23 0830 07/22/23 0516 07/23/23 0454 07/24/23 0500  NA 137 135 136 137 138  K 3.9 4.1 3.9 3.6 4.1  CL 107 104 103 105 107  CO2 23 22 23  20* 15*  GLUCOSE 94 147* 96 97 182*  BUN 27* 23 19 18  25*  CREATININE 1.52* 1.41* 1.46* 1.62* 1.96*  CALCIUM 7.9* 8.3* 8.4* 8.3* 8.5*  MG 2.2 2.0 1.9 1.7 2.0   Liver Function Tests: No results for input(s): "AST", "ALT", "ALKPHOS", "BILITOT", "PROT", "ALBUMIN" in the last 168 hours. CBG: Recent Labs  Lab 07/23/23 1148 07/23/23 1638 07/23/23 2201 07/24/23 0910 07/24/23 1123  GLUCAP 116* 137* 138* 205* 214*    Discharge time spent: less than 30 minutes.  Signed: Cherylle Corwin, MD [facility] 07/25/2023

## 2023-08-08 DEATH — deceased
